# Patient Record
Sex: Female | Born: 1945
Health system: Southern US, Community
[De-identification: ages and names within clinical notes are randomized; demographics above are authoritative.]

## PROBLEM LIST (undated history)

## (undated) DIAGNOSIS — M5481 Occipital neuralgia: Secondary | ICD-10-CM

## (undated) DIAGNOSIS — I3139 Other pericardial effusion (noninflammatory): Secondary | ICD-10-CM

## (undated) DIAGNOSIS — I428 Other cardiomyopathies: Secondary | ICD-10-CM

## (undated) DIAGNOSIS — I2510 Atherosclerotic heart disease of native coronary artery without angina pectoris: Secondary | ICD-10-CM

## (undated) DIAGNOSIS — M5136 Other intervertebral disc degeneration, lumbar region: Secondary | ICD-10-CM

## (undated) DIAGNOSIS — R7303 Prediabetes: Secondary | ICD-10-CM

## (undated) DIAGNOSIS — M51369 Other intervertebral disc degeneration, lumbar region without mention of lumbar back pain or lower extremity pain: Secondary | ICD-10-CM

## (undated) DIAGNOSIS — E559 Vitamin D deficiency, unspecified: Secondary | ICD-10-CM

## (undated) DIAGNOSIS — E039 Hypothyroidism, unspecified: Secondary | ICD-10-CM

## (undated) DIAGNOSIS — K219 Gastro-esophageal reflux disease without esophagitis: Secondary | ICD-10-CM

## (undated) DIAGNOSIS — I10 Essential (primary) hypertension: Secondary | ICD-10-CM

## (undated) DIAGNOSIS — I700 Atherosclerosis of aorta: Secondary | ICD-10-CM

## (undated) DIAGNOSIS — R51 Headache: Secondary | ICD-10-CM

## (undated) DIAGNOSIS — T7840XA Allergy, unspecified, initial encounter: Secondary | ICD-10-CM

## (undated) DIAGNOSIS — K5790 Diverticulosis of intestine, part unspecified, without perforation or abscess without bleeding: Secondary | ICD-10-CM

## (undated) DIAGNOSIS — K449 Diaphragmatic hernia without obstruction or gangrene: Secondary | ICD-10-CM

## (undated) DIAGNOSIS — Z8619 Personal history of other infectious and parasitic diseases: Secondary | ICD-10-CM

## (undated) DIAGNOSIS — F329 Major depressive disorder, single episode, unspecified: Secondary | ICD-10-CM

## (undated) DIAGNOSIS — M6281 Muscle weakness (generalized): Secondary | ICD-10-CM

## (undated) DIAGNOSIS — F419 Anxiety disorder, unspecified: Secondary | ICD-10-CM

## (undated) DIAGNOSIS — T8859XA Other complications of anesthesia, initial encounter: Secondary | ICD-10-CM

## (undated) DIAGNOSIS — E785 Hyperlipidemia, unspecified: Secondary | ICD-10-CM

## (undated) DIAGNOSIS — M1990 Unspecified osteoarthritis, unspecified site: Secondary | ICD-10-CM

## (undated) DIAGNOSIS — J90 Pleural effusion, not elsewhere classified: Secondary | ICD-10-CM

## (undated) DIAGNOSIS — E539 Vitamin B deficiency, unspecified: Secondary | ICD-10-CM

## (undated) DIAGNOSIS — M549 Dorsalgia, unspecified: Secondary | ICD-10-CM

## (undated) DIAGNOSIS — G629 Polyneuropathy, unspecified: Secondary | ICD-10-CM

## (undated) DIAGNOSIS — D61818 Other pancytopenia: Secondary | ICD-10-CM

## (undated) DIAGNOSIS — I509 Heart failure, unspecified: Secondary | ICD-10-CM

## (undated) DIAGNOSIS — N390 Urinary tract infection, site not specified: Secondary | ICD-10-CM

## (undated) DIAGNOSIS — F32A Depression, unspecified: Secondary | ICD-10-CM

## (undated) DIAGNOSIS — N189 Chronic kidney disease, unspecified: Secondary | ICD-10-CM

## (undated) DIAGNOSIS — G479 Sleep disorder, unspecified: Secondary | ICD-10-CM

## (undated) DIAGNOSIS — N182 Chronic kidney disease, stage 2 (mild): Secondary | ICD-10-CM

## (undated) DIAGNOSIS — R002 Palpitations: Secondary | ICD-10-CM

## (undated) DIAGNOSIS — I639 Cerebral infarction, unspecified: Secondary | ICD-10-CM

## (undated) DIAGNOSIS — I429 Cardiomyopathy, unspecified: Secondary | ICD-10-CM

## (undated) DIAGNOSIS — N281 Cyst of kidney, acquired: Secondary | ICD-10-CM

## (undated) DIAGNOSIS — I5022 Chronic systolic (congestive) heart failure: Secondary | ICD-10-CM

## (undated) DIAGNOSIS — J9600 Acute respiratory failure, unspecified whether with hypoxia or hypercapnia: Secondary | ICD-10-CM

## (undated) DIAGNOSIS — R519 Headache, unspecified: Secondary | ICD-10-CM

## (undated) DIAGNOSIS — Z993 Dependence on wheelchair: Secondary | ICD-10-CM

## (undated) DIAGNOSIS — R911 Solitary pulmonary nodule: Secondary | ICD-10-CM

## (undated) DIAGNOSIS — Z789 Other specified health status: Secondary | ICD-10-CM

## (undated) HISTORY — DX: Major depressive disorder, single episode, unspecified: F32.9

## (undated) HISTORY — PX: ABDOMINAL HYSTERECTOMY: SHX81

## (undated) HISTORY — PX: OTHER SURGICAL HISTORY: SHX169

## (undated) HISTORY — DX: Chronic kidney disease, stage 2 (mild): N18.2

## (undated) HISTORY — DX: Diverticulosis of intestine, part unspecified, without perforation or abscess without bleeding: K57.90

## (undated) HISTORY — DX: Allergy, unspecified, initial encounter: T78.40XA

## (undated) HISTORY — PX: BLADDER SURGERY: SHX569

## (undated) HISTORY — DX: Pleural effusion, not elsewhere classified: J90

## (undated) HISTORY — DX: Hyperlipidemia, unspecified: E78.5

## (undated) HISTORY — PX: JOINT REPLACEMENT: SHX530

## (undated) HISTORY — PX: TONSILLECTOMY AND ADENOIDECTOMY: SUR1326

## (undated) HISTORY — PX: REPLACEMENT TOTAL KNEE: SUR1224

## (undated) HISTORY — DX: Depression, unspecified: F32.A

## (undated) HISTORY — DX: Prediabetes: R73.03

## (undated) HISTORY — DX: Other pericardial effusion (noninflammatory): I31.39

## (undated) HISTORY — DX: Occipital neuralgia: M54.81

## (undated) HISTORY — DX: Chronic systolic (congestive) heart failure: I50.22

## (undated) HISTORY — DX: Gastro-esophageal reflux disease without esophagitis: K21.9

## (undated) HISTORY — PX: BREAST BIOPSY: SHX20

## (undated) HISTORY — DX: Heart failure, unspecified: I50.9

## (undated) HISTORY — DX: Other cardiomyopathies: I42.8

## (undated) HISTORY — DX: Cerebral infarction, unspecified: I63.9

## (undated) HISTORY — PX: CHOLECYSTECTOMY: SHX55

## (undated) HISTORY — DX: Cardiomyopathy, unspecified: I42.9

## (undated) HISTORY — DX: Atherosclerotic heart disease of native coronary artery without angina pectoris: I25.10

## (undated) HISTORY — PX: BREAST EXCISIONAL BIOPSY: SUR124

## (undated) HISTORY — DX: Personal history of other infectious and parasitic diseases: Z86.19

## (undated) HISTORY — DX: Vitamin D deficiency, unspecified: E55.9

## (undated) HISTORY — DX: Unspecified osteoarthritis, unspecified site: M19.90

---

## 2005-11-21 ENCOUNTER — Other Ambulatory Visit: Admission: RE | Admit: 2005-11-21 | Discharge: 2005-11-21 | Payer: Self-pay | Admitting: Internal Medicine

## 2006-01-06 ENCOUNTER — Emergency Department (HOSPITAL_COMMUNITY): Admission: EM | Admit: 2006-01-06 | Discharge: 2006-01-07 | Payer: Self-pay | Admitting: Emergency Medicine

## 2006-05-28 ENCOUNTER — Ambulatory Visit: Payer: Self-pay | Admitting: *Deleted

## 2006-05-29 ENCOUNTER — Ambulatory Visit: Payer: Self-pay | Admitting: Cardiology

## 2006-06-10 ENCOUNTER — Ambulatory Visit: Payer: Self-pay | Admitting: *Deleted

## 2006-06-16 ENCOUNTER — Encounter: Payer: Self-pay | Admitting: Cardiology

## 2006-06-16 ENCOUNTER — Ambulatory Visit: Payer: Self-pay

## 2006-12-17 LAB — HM DEXA SCAN

## 2007-11-24 ENCOUNTER — Ambulatory Visit (HOSPITAL_COMMUNITY): Admission: RE | Admit: 2007-11-24 | Discharge: 2007-11-24 | Payer: Self-pay | Admitting: Sports Medicine

## 2007-12-02 ENCOUNTER — Encounter: Admission: RE | Admit: 2007-12-02 | Discharge: 2007-12-02 | Payer: Self-pay | Admitting: Sports Medicine

## 2007-12-18 ENCOUNTER — Encounter: Admission: RE | Admit: 2007-12-18 | Discharge: 2007-12-18 | Payer: Self-pay | Admitting: Sports Medicine

## 2008-04-25 ENCOUNTER — Inpatient Hospital Stay (HOSPITAL_COMMUNITY): Admission: EM | Admit: 2008-04-25 | Discharge: 2008-04-28 | Payer: Self-pay | Admitting: Emergency Medicine

## 2008-04-25 ENCOUNTER — Ambulatory Visit: Payer: Self-pay | Admitting: Cardiology

## 2008-04-26 ENCOUNTER — Encounter (INDEPENDENT_AMBULATORY_CARE_PROVIDER_SITE_OTHER): Payer: Self-pay | Admitting: Internal Medicine

## 2008-04-26 ENCOUNTER — Encounter: Payer: Self-pay | Admitting: Internal Medicine

## 2008-04-26 ENCOUNTER — Ambulatory Visit: Payer: Self-pay | Admitting: Vascular Surgery

## 2008-04-27 ENCOUNTER — Encounter (INDEPENDENT_AMBULATORY_CARE_PROVIDER_SITE_OTHER): Payer: Self-pay | Admitting: Internal Medicine

## 2009-03-30 ENCOUNTER — Emergency Department (HOSPITAL_COMMUNITY): Admission: EM | Admit: 2009-03-30 | Discharge: 2009-03-30 | Payer: Self-pay | Admitting: Emergency Medicine

## 2009-06-26 ENCOUNTER — Emergency Department (HOSPITAL_COMMUNITY): Admission: EM | Admit: 2009-06-26 | Discharge: 2009-06-26 | Payer: Self-pay | Admitting: Emergency Medicine

## 2009-08-05 DIAGNOSIS — I639 Cerebral infarction, unspecified: Secondary | ICD-10-CM

## 2009-08-05 DIAGNOSIS — G459 Transient cerebral ischemic attack, unspecified: Secondary | ICD-10-CM

## 2009-08-05 HISTORY — DX: Cerebral infarction, unspecified: I63.9

## 2009-08-05 HISTORY — PX: KNEE ARTHROSCOPY: SHX127

## 2009-08-05 HISTORY — DX: Transient cerebral ischemic attack, unspecified: G45.9

## 2009-11-29 ENCOUNTER — Emergency Department (HOSPITAL_COMMUNITY): Admission: EM | Admit: 2009-11-29 | Discharge: 2009-11-29 | Payer: Self-pay | Admitting: Emergency Medicine

## 2009-12-12 ENCOUNTER — Telehealth (INDEPENDENT_AMBULATORY_CARE_PROVIDER_SITE_OTHER): Payer: Self-pay | Admitting: *Deleted

## 2009-12-26 ENCOUNTER — Inpatient Hospital Stay (HOSPITAL_COMMUNITY): Admission: EM | Admit: 2009-12-26 | Discharge: 2009-12-28 | Payer: Self-pay | Admitting: Emergency Medicine

## 2010-01-02 ENCOUNTER — Encounter: Admission: RE | Admit: 2010-01-02 | Discharge: 2010-01-15 | Payer: Self-pay | Admitting: Orthopedic Surgery

## 2010-01-16 LAB — HM PAP SMEAR: HM Pap smear: NEGATIVE

## 2010-01-26 ENCOUNTER — Other Ambulatory Visit: Admission: RE | Admit: 2010-01-26 | Discharge: 2010-01-26 | Payer: Self-pay | Admitting: Internal Medicine

## 2010-02-19 ENCOUNTER — Encounter: Admission: RE | Admit: 2010-02-19 | Discharge: 2010-02-19 | Payer: Self-pay | Admitting: Internal Medicine

## 2010-08-26 ENCOUNTER — Encounter: Payer: Self-pay | Admitting: Sports Medicine

## 2010-09-04 NOTE — Progress Notes (Signed)
  Faxed Stress over to [facility] [phone] [facility]  Dec 12, 2009 10:41 AM

## 2010-10-22 LAB — GLUCOSE, CAPILLARY
Glucose-Capillary: 102 mg/dL — ABNORMAL HIGH (ref 70–99)
Glucose-Capillary: 110 mg/dL — ABNORMAL HIGH (ref 70–99)
Glucose-Capillary: 117 mg/dL — ABNORMAL HIGH (ref 70–99)
Glucose-Capillary: 119 mg/dL — ABNORMAL HIGH (ref 70–99)
Glucose-Capillary: 125 mg/dL — ABNORMAL HIGH (ref 70–99)
Glucose-Capillary: 149 mg/dL — ABNORMAL HIGH (ref 70–99)
Glucose-Capillary: 85 mg/dL (ref 70–99)
Glucose-Capillary: 88 mg/dL (ref 70–99)
Glucose-Capillary: 92 mg/dL (ref 70–99)
Glucose-Capillary: 92 mg/dL (ref 70–99)

## 2010-10-22 LAB — URINE MICROSCOPIC-ADD ON

## 2010-10-22 LAB — CBC
HCT: 30.9 % — ABNORMAL LOW (ref 36.0–46.0)
HCT: 32 % — ABNORMAL LOW (ref 36.0–46.0)
HCT: 38.5 % (ref 36.0–46.0)
Hemoglobin: 10.3 g/dL — ABNORMAL LOW (ref 12.0–15.0)
Hemoglobin: 10.8 g/dL — ABNORMAL LOW (ref 12.0–15.0)
Hemoglobin: 13.4 g/dL (ref 12.0–15.0)
MCHC: 33.3 g/dL (ref 30.0–36.0)
MCHC: 33.7 g/dL (ref 30.0–36.0)
MCHC: 34.7 g/dL (ref 30.0–36.0)
MCV: 91.8 fL (ref 78.0–100.0)
MCV: 93 fL (ref 78.0–100.0)
MCV: 93.6 fL (ref 78.0–100.0)
Platelets: 154 10*3/uL (ref 150–400)
Platelets: 174 10*3/uL (ref 150–400)
Platelets: 192 10*3/uL (ref 150–400)
RBC: 3.3 MIL/uL — ABNORMAL LOW (ref 3.87–5.11)
RBC: 3.44 MIL/uL — ABNORMAL LOW (ref 3.87–5.11)
RBC: 4.2 MIL/uL (ref 3.87–5.11)
RDW: 11.5 % (ref 11.5–15.5)
RDW: 12.3 % (ref 11.5–15.5)
RDW: 12.7 % (ref 11.5–15.5)
WBC: 4.2 10*3/uL (ref 4.0–10.5)
WBC: 6.1 10*3/uL (ref 4.0–10.5)
WBC: 9.2 10*3/uL (ref 4.0–10.5)

## 2010-10-22 LAB — CULTURE, BLOOD (ROUTINE X 2)
Culture: NO GROWTH
Culture: NO GROWTH

## 2010-10-22 LAB — COMPREHENSIVE METABOLIC PANEL
ALT: 20 U/L (ref 0–35)
ALT: 23 U/L (ref 0–35)
ALT: 28 U/L (ref 0–35)
AST: 24 U/L (ref 0–37)
AST: 26 U/L (ref 0–37)
AST: 39 U/L — ABNORMAL HIGH (ref 0–37)
Albumin: 2.9 g/dL — ABNORMAL LOW (ref 3.5–5.2)
Albumin: 3 g/dL — ABNORMAL LOW (ref 3.5–5.2)
Albumin: 4 g/dL (ref 3.5–5.2)
Alkaline Phosphatase: 61 U/L (ref 39–117)
Alkaline Phosphatase: 61 U/L (ref 39–117)
Alkaline Phosphatase: 77 U/L (ref 39–117)
BUN: 11 mg/dL (ref 6–23)
BUN: 21 mg/dL (ref 6–23)
BUN: 5 mg/dL — ABNORMAL LOW (ref 6–23)
CO2: 21 mEq/L (ref 19–32)
CO2: 26 mEq/L (ref 19–32)
CO2: 27 mEq/L (ref 19–32)
Calcium: 8.4 mg/dL (ref 8.4–10.5)
Calcium: 8.6 mg/dL (ref 8.4–10.5)
Calcium: 9.3 mg/dL (ref 8.4–10.5)
Chloride: 106 mEq/L (ref 96–112)
Chloride: 110 mEq/L (ref 96–112)
Chloride: 112 mEq/L (ref 96–112)
Creatinine, Ser: 1 mg/dL (ref 0.4–1.2)
Creatinine, Ser: 1.05 mg/dL (ref 0.4–1.2)
Creatinine, Ser: 1.05 mg/dL (ref 0.4–1.2)
GFR calc Af Amer: >60 mL/min (ref >60)
GFR calc Af Amer: >60 mL/min (ref >60)
GFR calc Af Amer: >60 mL/min (ref >60)
GFR calc non Af Amer: 53 mL/min — ABNORMAL LOW (ref >60)
GFR calc non Af Amer: 53 mL/min — ABNORMAL LOW (ref >60)
GFR calc non Af Amer: 56 mL/min — ABNORMAL LOW (ref >60)
Glucose, Bld: 100 mg/dL — ABNORMAL HIGH (ref 70–99)
Glucose, Bld: 144 mg/dL — ABNORMAL HIGH (ref 70–99)
Glucose, Bld: 95 mg/dL (ref 70–99)
Potassium: 3.9 mEq/L (ref 3.5–5.1)
Potassium: 4.3 mEq/L (ref 3.5–5.1)
Potassium: 4.4 mEq/L (ref 3.5–5.1)
Sodium: 137 mEq/L (ref 135–145)
Sodium: 141 mEq/L (ref 135–145)
Sodium: 141 mEq/L (ref 135–145)
Total Bilirubin: 0.3 mg/dL (ref 0.3–1.2)
Total Bilirubin: 0.3 mg/dL (ref 0.3–1.2)
Total Bilirubin: 0.8 mg/dL (ref 0.3–1.2)
Total Protein: 5.2 g/dL — ABNORMAL LOW (ref 6.0–8.3)
Total Protein: 5.4 g/dL — ABNORMAL LOW (ref 6.0–8.3)
Total Protein: 7.1 g/dL (ref 6.0–8.3)

## 2010-10-22 LAB — STOOL CULTURE

## 2010-10-22 LAB — DIFFERENTIAL
Basophils Absolute: 0.1 10*3/uL (ref 0.0–0.1)
Basophils Relative: 1 % (ref 0–1)
Eosinophils Absolute: 0.1 10*3/uL (ref 0.0–0.7)
Eosinophils Relative: 1 % (ref 0–5)
Lymphocytes Relative: 11 % — ABNORMAL LOW (ref 12–46)
Lymphs Abs: 1.1 10*3/uL (ref 0.7–4.0)
Monocytes Absolute: 0.4 10*3/uL (ref 0.1–1.0)
Monocytes Relative: 4 % (ref 3–12)
Neutro Abs: 7.5 10*3/uL (ref 1.7–7.7)
Neutrophils Relative %: 83 % — ABNORMAL HIGH (ref 43–77)

## 2010-10-22 LAB — LIPID PANEL
Cholesterol: 90 mg/dL (ref 0–200)
HDL: 33 mg/dL — ABNORMAL LOW (ref >39)
LDL Cholesterol: 40 mg/dL (ref 0–99)
Total CHOL/HDL Ratio: 2.7 RATIO
Triglycerides: 87 mg/dL (ref <150)
VLDL: 17 mg/dL (ref 0–40)

## 2010-10-22 LAB — URINALYSIS, ROUTINE W REFLEX MICROSCOPIC
Glucose, UA: NEGATIVE mg/dL
Hgb urine dipstick: NEGATIVE
Nitrite: NEGATIVE
Protein, ur: NEGATIVE mg/dL
Specific Gravity, Urine: 1.021 (ref 1.005–1.030)
Urobilinogen, UA: 0.2 mg/dL (ref 0.0–1.0)
pH: 5.5 (ref 5.0–8.0)

## 2010-10-22 LAB — HEMOGLOBIN A1C
Hgb A1c MFr Bld: 5.3 % (ref <5.7)
Mean Plasma Glucose: 105 mg/dL (ref <117)

## 2010-10-22 LAB — PROTIME-INR
INR: 1.01 (ref 0.00–1.49)
Prothrombin Time: 13.2 seconds (ref 11.6–15.2)

## 2010-10-22 LAB — URINE CULTURE: Colony Count: 60000

## 2010-10-22 LAB — LACTIC ACID, PLASMA: Lactic Acid, Venous: 1.6 mmol/L (ref 0.5–2.2)

## 2010-10-22 LAB — LIPASE, BLOOD
Lipase: 62 U/L — ABNORMAL HIGH (ref 11–59)
Lipase: 79 U/L — ABNORMAL HIGH (ref 11–59)

## 2010-10-22 LAB — CLOSTRIDIUM DIFFICILE EIA
C difficile Toxins A+B, EIA: 25
C difficile Toxins A+B, EIA: NEGATIVE

## 2010-10-22 LAB — TSH: TSH: 2.122 u[IU]/mL (ref 0.350–4.500)

## 2010-10-22 LAB — HEMOCCULT GUIAC POC 1CARD (OFFICE): Fecal Occult Bld: NEGATIVE

## 2010-10-22 LAB — APTT: aPTT: 28 seconds (ref 24–37)

## 2010-11-07 LAB — COMPREHENSIVE METABOLIC PANEL WITH GFR
ALT: 29 U/L (ref 0–35)
AST: 26 U/L (ref 0–37)
Albumin: 3.9 g/dL (ref 3.5–5.2)
Alkaline Phosphatase: 75 U/L (ref 39–117)
BUN: 15 mg/dL (ref 6–23)
CO2: 21 meq/L (ref 19–32)
Calcium: 9.4 mg/dL (ref 8.4–10.5)
Chloride: 101 meq/L (ref 96–112)
Creatinine, Ser: 1.01 mg/dL (ref 0.4–1.2)
GFR calc non Af Amer: 55 mL/min — ABNORMAL LOW
Glucose, Bld: 103 mg/dL — ABNORMAL HIGH (ref 70–99)
Potassium: 3.9 meq/L (ref 3.5–5.1)
Sodium: 133 meq/L — ABNORMAL LOW (ref 135–145)
Total Bilirubin: 0.9 mg/dL (ref 0.3–1.2)
Total Protein: 7.5 g/dL (ref 6.0–8.3)

## 2010-11-07 LAB — DIFFERENTIAL
Basophils Absolute: 0 10*3/uL (ref 0.0–0.1)
Basophils Relative: 0 % (ref 0–1)
Eosinophils Absolute: 0.1 10*3/uL (ref 0.0–0.7)
Eosinophils Relative: 1 % (ref 0–5)
Lymphocytes Relative: 22 % (ref 12–46)
Lymphs Abs: 1.1 10*3/uL (ref 0.7–4.0)
Monocytes Absolute: 0.4 10*3/uL (ref 0.1–1.0)
Monocytes Relative: 9 % (ref 3–12)
Neutro Abs: 3.3 10*3/uL (ref 1.7–7.7)
Neutrophils Relative %: 68 % (ref 43–77)

## 2010-11-07 LAB — URINE CULTURE: Colony Count: 50000

## 2010-11-07 LAB — URINALYSIS, ROUTINE W REFLEX MICROSCOPIC
Glucose, UA: NEGATIVE mg/dL
Hgb urine dipstick: NEGATIVE
Ketones, ur: 15 mg/dL — AB
Nitrite: NEGATIVE
Protein, ur: NEGATIVE mg/dL
Specific Gravity, Urine: 1.023 (ref 1.005–1.030)
Urobilinogen, UA: 1 mg/dL (ref 0.0–1.0)
pH: 5.5 (ref 5.0–8.0)

## 2010-11-07 LAB — URINE MICROSCOPIC-ADD ON

## 2010-11-07 LAB — CBC
HCT: 41.3 % (ref 36.0–46.0)
Hemoglobin: 14.4 g/dL (ref 12.0–15.0)
MCHC: 34.9 g/dL (ref 30.0–36.0)
MCV: 93.1 fL (ref 78.0–100.0)
Platelets: 155 10*3/uL (ref 150–400)
RBC: 4.44 MIL/uL (ref 3.87–5.11)
RDW: 12.7 % (ref 11.5–15.5)
WBC: 4.9 10*3/uL (ref 4.0–10.5)

## 2010-11-07 LAB — POCT CARDIAC MARKERS
CKMB, poc: <1 ng/mL — ABNORMAL LOW (ref 1.0–8.0)
Myoglobin, poc: 74.8 ng/mL (ref 12–200)
Troponin i, poc: <0.05 ng/mL (ref 0.00–0.09)

## 2010-11-10 LAB — CBC
HCT: 42.6 % (ref 36.0–46.0)
Hemoglobin: 14.7 g/dL (ref 12.0–15.0)
MCHC: 34.6 g/dL (ref 30.0–36.0)
MCV: 94.4 fL (ref 78.0–100.0)
Platelets: 184 10*3/uL (ref 150–400)
RBC: 4.51 MIL/uL (ref 3.87–5.11)
RDW: 11.9 % (ref 11.5–15.5)
WBC: 7.3 10*3/uL (ref 4.0–10.5)

## 2010-11-10 LAB — COMPREHENSIVE METABOLIC PANEL
ALT: 25 U/L (ref 0–35)
AST: 24 U/L (ref 0–37)
Albumin: 4.6 g/dL (ref 3.5–5.2)
Alkaline Phosphatase: 82 U/L (ref 39–117)
BUN: 28 mg/dL — ABNORMAL HIGH (ref 6–23)
CO2: 24 mEq/L (ref 19–32)
Calcium: 9.6 mg/dL (ref 8.4–10.5)
Chloride: 101 mEq/L (ref 96–112)
Creatinine, Ser: 1.16 mg/dL (ref 0.4–1.2)
GFR calc Af Amer: 57 mL/min — ABNORMAL LOW (ref >60)
GFR calc non Af Amer: 47 mL/min — ABNORMAL LOW (ref >60)
Glucose, Bld: 92 mg/dL (ref 70–99)
Potassium: 3.3 mEq/L — ABNORMAL LOW (ref 3.5–5.1)
Sodium: 136 mEq/L (ref 135–145)
Total Bilirubin: 0.7 mg/dL (ref 0.3–1.2)
Total Protein: 7.9 g/dL (ref 6.0–8.3)

## 2010-11-10 LAB — URINALYSIS, ROUTINE W REFLEX MICROSCOPIC
Bilirubin Urine: NEGATIVE
Glucose, UA: NEGATIVE mg/dL
Hgb urine dipstick: NEGATIVE
Ketones, ur: NEGATIVE mg/dL
Nitrite: NEGATIVE
Protein, ur: NEGATIVE mg/dL
Specific Gravity, Urine: 1.012 (ref 1.005–1.030)
Urobilinogen, UA: 0.2 mg/dL (ref 0.0–1.0)
pH: 5 (ref 5.0–8.0)

## 2010-11-10 LAB — DIFFERENTIAL
Basophils Absolute: 0 10*3/uL (ref 0.0–0.1)
Basophils Relative: 1 % (ref 0–1)
Eosinophils Absolute: 0.1 10*3/uL (ref 0.0–0.7)
Eosinophils Relative: 2 % (ref 0–5)
Lymphocytes Relative: 23 % (ref 12–46)
Lymphs Abs: 1.7 10*3/uL (ref 0.7–4.0)
Monocytes Absolute: 0.5 10*3/uL (ref 0.1–1.0)
Monocytes Relative: 6 % (ref 3–12)
Neutro Abs: 5 10*3/uL (ref 1.7–7.7)
Neutrophils Relative %: 69 % (ref 43–77)

## 2010-11-10 LAB — URINE CULTURE: Colony Count: 15000

## 2010-11-10 LAB — URINE MICROSCOPIC-ADD ON

## 2010-11-10 LAB — POCT CARDIAC MARKERS
CKMB, poc: 1.6 ng/mL (ref 1.0–8.0)
Myoglobin, poc: 112 ng/mL (ref 12–200)
Troponin i, poc: <0.05 ng/mL (ref 0.00–0.09)

## 2010-12-18 NOTE — H&P (Signed)
NAME:  Kimberly Cook, Kimberly Cook NO.:  [id number]   MEDICAL RECORD NO.:  [id number]          PATIENT TYPE:  EMS   LOCATION:  ED                           FACILITY:  [facility]   PHYSICIAN:  Richarda Overlie, MD       DATE OF BIRTH:  [birth year]   DATE OF ADMISSION:  04/25/2008  DATE OF DISCHARGE:                              HISTORY & PHYSICAL   OBJECTIVE:  This is a 65 year old female with a history of dyslipidemia  and hypertension who presents to the Emergency Room with the chief  complaint of light headedness for the last three weeks. The patient  states that three weeks ago she was treated for a urinary tract  infection and the primary care doctor prescribed Ciprofloxacin. The  patient started Ciprofloxacin last Thursday and took it through Sunday.  The patient thought that the light headedness was a side effect of the  Ciprofloxacin and discontinued the Ciprofloxacin herself. The patient  has experienced light headedness especially on changing positions from a  sitting to a standing position. She has also experienced occipital  headache and occasional blurry and double vision when she wakes up in  the morning.  She denies any chest pain, palpitation, shortness of  breath. Denies any fevers, chills or rigors. At home she has experienced  flank pain three weeks ago which somewhat improved after she started on  Ciprofloxacin.  In the Emergency Room the patient was found to be orthostatic,  tachycardic but found to be afebrile.   REVIEW OF SYSTEMS:  A temperature on review of systems was done and is  included H/P.   PAST MEDICAL HISTORY:  Anxiety, hypertension, hypertriglyceridemia,  hypothyroidism.   SOCIAL HISTORY:  Denies any drug use, smoking history or drinking  history.   FAMILY HISTORY:  Positive for cancer, diabetes and hypertension.   HOME MEDICATIONS:  1. Triamterine  2. Hydrochlorothiazide  3. Zocor 40 mg q daily  4. Levothyroxine 30 mcg q daily  5. Vitamin D  one tablet twice a day  6. Omeprazole 20 mg once a day  7. Aspirin 81 mg p.o. daily.   PHYSICAL EXAMINATION:  VITAL SIGNS:  Blood pressure sitting 112/82,  pulse of 80, respirations 22.  Lying 108/82, pulse 81.  Standing 110/70,  pulse 101, respirations 22.  GENERAL:  The patient appears to be anxious, otherwise comfortable in no  acute distress.  HEENT:  Pupils equal and reactive to light. Extraocular movements are  intact.  The sclera are anicteric.  LUNGS:  Clear to auscultation bilaterally, no wheezes, no crackles or  rhonchi. No accessory muscle use.  CARDIOVASCULAR:  Regular rate and rhythm.  No appreciable murmurs, rubs  or gallops.  No JVD.  ABDOMEN:  Soft, non-tender, non-distended.  No hepatosplenomegaly.  No  CVA tenderness elicited. Hypoactive bowel sounds.  EXTREMITIES:  Trace pedal edema noted in bilateral lower extremities.  Distal pulses 2+ bilaterally.  NEUROLOGIC:  Cranial nerves 2 to 12 grossly intact without any focal  neurologic deficits noted. However the patient has rotatory nystagmus.  No cerebellar deficits noted.  SKIN:  Intact without any bruises  or petechiae.   LABS:  WBC .7, hemoglobin 14, blood hematocrit 41.2, MCV 90.6, platelet  count 356, sodium 131, potassium 2.8, chloride 88, glucose 151, BUN 51,  creatinine 2.2, calcium 9.6, albumin 3.8, specific gravity 1.016.   Troponin of 0.03.  EKG shows normal sinus rhythm without any obvious ST  changes.   ASSESSMENT & PLAN:  1. Presyncope, light headedness. The patient's symptoms of presyncope      and light headed could be related to her most recent episode of      urinary tract infection and dehydration which is reflected in her      renal insufficiency. The patient is also found to be orthostatic      therefore rehydration will be initiated.  ACS  will be ruled out      and the patient will be monitored on a telemetry bed. The patient      does not report any symptoms of chest pain or palpitations.       Therefore I doubt that this is related to valvular heart disease,      however she is a longstanding hypertensive and other CVS RF ,      cardiac echo will be obtained.  2. Vertigo, light headedness. The patient has the presence of a      nystagmus.  A brain stem CVA cannot be ruled out at this time      without any imaging studies. Therefore an MRI of the brain will be      obtained. The patient will be started neuro checks q4 hours.  The      patient will also be started on empiric Meclizine 25 mg p.o. every      8 hours and aspirin 81 mg p.o. daily.  3. Dyslipidemia. The patient will be continued on Zocor. We will check      her lipid status in the morning.  4. Hypothyroidism.  The patient will be continued on her Synthroid and      a TSH and free T4 will be checked in the morning.  5. Renal insufficiency. This is likely prerenal etiology.  We will      check her CPK, hydrate aggressively with IV fluids and repeat the      BMP  in the morning.   The patient will be admitted to a medical bed with telemetry.      Richarda Overlie, MD  Electronically Signed     NA/MEDQ  D:  04/25/2008  T:  04/25/2008  Job:  [id number]

## 2010-12-18 NOTE — Discharge Summary (Signed)
NAME:  Kimberly Cook, Kimberly Cook             ACCOUNT NO.:  [id number]   MEDICAL RECORD NO.:  [id number]          PATIENT TYPE:  INP   LOCATION:  1424                         FACILITY:  [facility]   PHYSICIAN:  Eduard Clos, MDDATE OF BIRTH:  13-Oct-1945   DATE OF ADMISSION:  04/25/2008  DATE OF DISCHARGE:  04/28/2008                               DISCHARGE SUMMARY   FINAL DIAGNOSES:  1. Near syncope secondary to dehydration.  2. Acute renal failure improved with hydration.  3. Possible cerebrovascular accident.  4. Hypothyroidism.  5. Hyperlipidemia.  6. Chronic headache.  7. Hypertension.   COURSE IN HOSPITAL:  A 65 year old female with a history of dyslipidemia  and hypertension presented to the ER with complaints of lightheadedness  over the last 2 or 3 weeks.  On admission, the patient was found to have  orthostatic changes.  The patient was on diuretics called Maxzide, which  was discontinued, and her creatinine was found to be around 2.2.  The  patient was placed on gentle hydration.  The patient also had an MRI and  MRA of the brain.  MRI showed possible punctate  infarct in the left  superior frontal gyrus with, as a matter of fact, no associated  abnormality, hemorrhage, or mass effect.  Otherwise, no acute  intracranial abnormality.  MRA was negative for any intracranial  process.  The patient was hydrated with IV fluids, and her creatinine  slowly improved and at the time of discharge is 1.2.  The patient's  symptoms are largely resolved at this time.  The patient has been having  chronic headaches over the last more than 10 years, which the patient  states are usually relieved by Percogesic medication.  At this time, a  two-D echo has been done; the results are still pending.  As the patient  is eager to go home, I have advised patient to follow the results of two-  D echo with her primary care physician, Dr. Marisue Brooklyn, for which she  has agreed.   DISCHARGE  MEDICATIONS:  1. Zocor 40 mg p.o. daily.  2. Levothyroxine has been increased to 75 mcg p.o. daily.  3. Vitamin D 200 mg twice a day.  4. Prilosec 20 mg p.o. daily.  5. Aleve 200 mg p.o. twice a day as needed p.r.n.  6. Aspirin 325 mg p.o. daily.  7. Fioricet 1 tablet p.o. q.6 p.r.n. for headache.   PLAN:  The patient advised to follow with her primary care physician  within a week's time to recheck a basic metabolic panel and to follow  pending two-D echo results with primary care physician, Dr. Marisue Brooklyn.  Recheck TSH in 1 month's time.  The patient is to be on a  cardiac healthy diet and to have her activity as tolerated with fall  precautions.      Eduard Clos, MD  Electronically Signed     ANK/MEDQ  D:  04/28/2008  T:  04/28/2008  Job:  [id number]   cc:   Lovenia Kim, D.O.  Fax: [phone]

## 2010-12-21 NOTE — Letter (Signed)
May 28, 2006     Lovenia Kim, D.O.  [address]  [place], [place] [address]   RE:  Kimberly Cook, Kimberly Cook  MRN:  [id number]  /  DOB:  1946-01-13   Dear Amy:   I had the opportunity of performing a cardiology consultation on your nice  patient, Kimberly Cook.   As you know, Kimberly Cook is a very pleasant 65 year old white, married  female with multiple risk factors and recent history of dizzy spells  associated with palpitations.  She has noticed somewhat similar symptoms  over the past year, but these have been more prevalent in the past several  weeks.  Last Thursday p.m., she was quite tremulous, shaking all over, she  thinks that her heart was beating fast.  She has also had some precordial  chest pressure described as a clinched fist, however, this is somewhat  atypical in that it is not necessarily related to exertion.   Risk factors include family history with father dying of a heart attack at  age 21, mother dying of a heart attack and diabetes at age 33.  She has  hyperlipidemia controlled on therapy and hypertension controlled on therapy.  Allergies are none.  Cigarettes are none.  No drug use.  Coffee none.   Past hospitalization includes hysterectomy in 1974, gastric plication in  1985, bladder repair in 2002.  After the stomach plication, the patient lost  100 pounds and now has regained about 40 pounds.   REVIEW OF SYSTEMS:  As in the HPI - she does have dizziness usually  associated with palpitations.  Also, she has headaches.  Eyes:  She wears  contact lenses.  She has had tonsillectomy.  Cardiovascular:  As in the  present illness.  GI history is negative except for occasional reflux.  GU  negative.  GYN:  Three pregnancies with two children.  Endocrine:  Negative.  Musculoskeletal:  Leg cramps and generalized arthritis.   SOCIAL HISTORY:  She works as a Probation officer.  She has two children.   FAMILY HISTORY:  As previously   noted.   Medications include:  1. Zocor 40.  2. Maxzide 75/50.  3. Ambien 10.  4. Prilosec 20.  5. Aleve.  6. Lexapro 10.  7. Accupril 20.  8. Aspirin 81, which she started a few weeks ago with the encouragement of      her sister.   PHYSICAL EXAMINATION:  Blood pressure 141/91, pulse 81.  Normal sinus  rhythm.  GENERAL:  Moderately obese.  HEENT:  Unremarkable.  NECK:  JVP not elevated.  Thyroid normal.  Carotid pulses palpable without  bruits.  LUNGS:  Clear.  CARDIAC:  Exam reveals no murmur, gallop, and rub.  ABDOMEN:  Liver, spleen and kidneys not palpable.  No masses.  EXTREMITIES:  Good pulses but no edema.  NEUROLOGICAL:  Normal.   IMPRESSION:  1. Tachy palpitations with dizziness.  2. Atypical chest discomfort in a patient with multiple risk factors      including:      a.     Strong family history.      b.     Hypertension, treated.      c.     Hyperlipidemia, treated.   EKG is within normal limits.   PLAN:  A stress Myoview and an event monitor.  I will plan to see her in two  weeks concerning this.  She will probably need a beta blocker, depending on  the findings  of the stress test and event monitor.   Thanks, once again, for the opportunity to share in this nice patient's  care.    Sincerely,     ______________________________  E. Graceann Congress, MD, [facility]    EJL/MedQ  DD: 05/28/2006  DT: 05/29/2006  Job #: [id number]

## 2010-12-21 NOTE — Letter (Signed)
June 10, 2006     Lovenia Kim, D.O.  [address]  [place], [place] [address]   RE:  [name], [name]  MRN:  [id number]  /  DOB:  10/12/1945   Dear Amy:   Your nice patient Kimberly Cook was seen for followup on June 10, 2006.  As you know she has a history of tachy palpitations with dizziness and a  strong family history of coronary artery disease, hypertension,  hyperlipidemia.   Her stress test revealed limited exercise tolerance, continued through only  4 minutes of [name] protocol.  However, there were no ST changes.  Her heart  rate was up to 141, and there was no sign of scar or ischemia.   The patient states that she has been feeling better, but she feels that most  of her symptoms are related to stress with the illness of her husband and  with her work.  She is on Zocor 40, Maxzide 75/50, Ambien, Prilosec,  Lexapro, Accupril 20, aspirin.   Blood pressure 128/83, pulse 69, normal sinus rhythm.  GENERAL:  Normal.  JVPs are elevated.  Carotid pulses are palpated equal without bruits.  LUNGS:  Clear.  CARDIAC:  Normal with no murmur, gallop, or rub.  EXTREMITIES:  No edema.   IMPRESSION:  Diagnoses as above.   I have reassured the patient that she appears to be doing well.  There is no  evidence of ischemia.   Should she continue to have chest discomfort, I would be happy to see her  again.  Otherwise, I will see her again in 4 months.  I have also suggested  a 2D echo in the interim.  Thanks for the opportunity of seeing this nice  patient.    Sincerely,     ______________________________  E. Graceann Congress, MD, [facility]    EJL/MedQ  DD: 06/10/2006  DT: 06/10/2006  Job #: [id number]

## 2010-12-21 NOTE — Assessment & Plan Note (Signed)
[facility]                              CARDIOLOGY OFFICE NOTE   NAME:Kimberly Cook, Kimberly Cook                      MRN:          [id number]  DATE:05/28/2006                            DOB:          08-May-1946    ADDENDUM:  Her medications include:  1. Zocor 40.  2. Maxzide 75/50.  3. Ambien 10.  4. Prilosec 20.  5. Aleve.  6. Lexapro 10.  7. Accupril 20.  8. Aspirin 81, which she started a few weeks ago with the encouragement of      her sister.    ______________________________  E. Graceann Congress, MD, [facility]    EJL/MedQ  DD: 05/28/2006  DT: 05/29/2006  Job #: [id number]

## 2011-02-16 LAB — HM COLONOSCOPY

## 2011-05-06 LAB — BASIC METABOLIC PANEL
BUN: 18
BUN: 24 — ABNORMAL HIGH
BUN: 41 — ABNORMAL HIGH
CO2: 24
CO2: 29
CO2: 30
Calcium: 8.4
Calcium: 8.9
Calcium: 9.2
Chloride: 100
Chloride: 103
Chloride: 104
Creatinine, Ser: 1.27 — ABNORMAL HIGH
Creatinine, Ser: 1.27 — ABNORMAL HIGH
Creatinine, Ser: 1.65 — ABNORMAL HIGH
GFR calc Af Amer: 38 — ABNORMAL LOW
GFR calc Af Amer: 51 — ABNORMAL LOW
GFR calc Af Amer: 51 — ABNORMAL LOW
GFR calc non Af Amer: 31 — ABNORMAL LOW
GFR calc non Af Amer: 43 — ABNORMAL LOW
GFR calc non Af Amer: 43 — ABNORMAL LOW
Glucose, Bld: 91
Glucose, Bld: 96
Glucose, Bld: 97
Potassium: 3.4 — ABNORMAL LOW
Potassium: 3.8
Potassium: 4.4
Sodium: 134 — ABNORMAL LOW
Sodium: 137
Sodium: 140

## 2011-05-06 LAB — URINE MICROSCOPIC-ADD ON

## 2011-05-06 LAB — CULTURE, BLOOD (ROUTINE X 2)
Culture: NO GROWTH
Culture: NO GROWTH

## 2011-05-06 LAB — COMPREHENSIVE METABOLIC PANEL
ALT: 30
AST: 33
Albumin: 3.8
Alkaline Phosphatase: 89
BUN: 51 — ABNORMAL HIGH
CO2: 23
Calcium: 9.6
Chloride: 88 — ABNORMAL LOW
Creatinine, Ser: 2.2 — ABNORMAL HIGH
GFR calc Af Amer: 27 — ABNORMAL LOW
GFR calc non Af Amer: 23 — ABNORMAL LOW
Glucose, Bld: 151 — ABNORMAL HIGH
Potassium: 2.8 — ABNORMAL LOW
Sodium: 131 — ABNORMAL LOW
Total Bilirubin: 1.3 — ABNORMAL HIGH
Total Protein: 8.4 — ABNORMAL HIGH

## 2011-05-06 LAB — CK TOTAL AND CKMB (NOT AT ARMC)
CK, MB: 0.8
Relative Index: INVALID
Total CK: 35

## 2011-05-06 LAB — HEMOGLOBIN A1C
Hgb A1c MFr Bld: 5.6
Mean Plasma Glucose: 114

## 2011-05-06 LAB — DIFFERENTIAL
Basophils Absolute: 0.1
Basophils Absolute: 0.1
Basophils Relative: 1
Basophils Relative: 1
Eosinophils Absolute: 0
Eosinophils Absolute: 0.1
Eosinophils Relative: 0
Eosinophils Relative: 2
Lymphocytes Relative: 15
Lymphocytes Relative: 23
Lymphs Abs: 1.3
Lymphs Abs: 1.6
Monocytes Absolute: 0.4
Monocytes Absolute: 0.5
Monocytes Relative: 5
Monocytes Relative: 6
Neutro Abs: 4.7
Neutro Abs: 6.9
Neutrophils Relative %: 67
Neutrophils Relative %: 79 — ABNORMAL HIGH

## 2011-05-06 LAB — CBC
HCT: 31.1 — ABNORMAL LOW
HCT: 32.8 — ABNORMAL LOW
HCT: 41.2
Hemoglobin: 10.6 — ABNORMAL LOW
Hemoglobin: 11.4 — ABNORMAL LOW
Hemoglobin: 14.3
MCHC: 34.1
MCHC: 34.7
MCHC: 35
MCV: 90.6
MCV: 91.3
MCV: 91.4
Platelets: 203
Platelets: 237
Platelets: 366
RBC: 3.4 — ABNORMAL LOW
RBC: 3.6 — ABNORMAL LOW
RBC: 4.55
RDW: 12
RDW: 12.1
RDW: 12.2
WBC: 4.6
WBC: 6.9
WBC: 8.7

## 2011-05-06 LAB — URINALYSIS, ROUTINE W REFLEX MICROSCOPIC
Bilirubin Urine: NEGATIVE
Glucose, UA: NEGATIVE
Hgb urine dipstick: NEGATIVE
Ketones, ur: NEGATIVE
Nitrite: NEGATIVE
Protein, ur: NEGATIVE
Specific Gravity, Urine: 1.016
Urobilinogen, UA: 0.2
pH: 5.5

## 2011-05-06 LAB — TROPONIN I: Troponin I: 0.03

## 2011-05-06 LAB — CARDIAC PANEL(CRET KIN+CKTOT+MB+TROPI)
CK, MB: 0.7
CK, MB: 1.8
Relative Index: INVALID
Relative Index: INVALID
Total CK: 31
Total CK: 51
Troponin I: 0.02
Troponin I: 0.03

## 2011-05-06 LAB — LIPID PANEL
Cholesterol: 111
HDL: 14 — ABNORMAL LOW
LDL Cholesterol: 71
Total CHOL/HDL Ratio: 7.9
Triglycerides: 131
VLDL: 26

## 2011-05-06 LAB — IRON AND TIBC
Iron: 116
Saturation Ratios: 48
TIBC: 241 — ABNORMAL LOW
UIBC: 125

## 2011-05-06 LAB — CALCIUM: Calcium: 8.9

## 2011-05-06 LAB — T4, FREE: Free T4: 1.72

## 2011-05-06 LAB — FERRITIN: Ferritin: 479 — ABNORMAL HIGH (ref 10–291)

## 2011-05-06 LAB — RETICULOCYTES
RBC.: 3.55 — ABNORMAL LOW
Retic Count, Absolute: 46.2
Retic Ct Pct: 1.3

## 2011-05-06 LAB — URINE CULTURE
Colony Count: NO GROWTH
Culture: NO GROWTH

## 2011-05-06 LAB — CK: Total CK: 34

## 2011-05-06 LAB — PHOSPHORUS: Phosphorus: 2.7

## 2011-05-06 LAB — B-NATRIURETIC PEPTIDE (CONVERTED LAB): Pro B Natriuretic peptide (BNP): <30

## 2011-05-06 LAB — TSH: TSH: 7.088 — ABNORMAL HIGH

## 2011-05-06 LAB — FOLATE: Folate: 8.2

## 2011-05-06 LAB — MAGNESIUM: Magnesium: 2.2

## 2011-05-06 LAB — VITAMIN B12: Vitamin B-12: 534 (ref 211–911)

## 2011-06-03 LAB — HM COLONOSCOPY

## 2011-06-25 ENCOUNTER — Other Ambulatory Visit (HOSPITAL_COMMUNITY): Payer: Self-pay | Admitting: Internal Medicine

## 2011-06-25 ENCOUNTER — Ambulatory Visit (HOSPITAL_COMMUNITY)
Admission: RE | Admit: 2011-06-25 | Discharge: 2011-06-25 | Disposition: A | Discharge status: Home / Self Care | Payer: Medicare Other | Source: Ambulatory Visit | Attending: Internal Medicine | Admitting: Internal Medicine

## 2011-06-25 DIAGNOSIS — M25559 Pain in unspecified hip: Secondary | ICD-10-CM

## 2011-06-25 DIAGNOSIS — M549 Dorsalgia, unspecified: Secondary | ICD-10-CM

## 2011-06-25 DIAGNOSIS — M51379 Other intervertebral disc degeneration, lumbosacral region without mention of lumbar back pain or lower extremity pain: Secondary | ICD-10-CM | POA: Insufficient documentation

## 2011-06-25 DIAGNOSIS — M47817 Spondylosis without myelopathy or radiculopathy, lumbosacral region: Secondary | ICD-10-CM | POA: Insufficient documentation

## 2011-06-25 DIAGNOSIS — M5137 Other intervertebral disc degeneration, lumbosacral region: Secondary | ICD-10-CM | POA: Insufficient documentation

## 2012-05-18 LAB — HM MAMMOGRAPHY

## 2013-06-23 ENCOUNTER — Other Ambulatory Visit: Payer: Self-pay | Admitting: Gastroenterology

## 2013-06-23 DIAGNOSIS — R1310 Dysphagia, unspecified: Secondary | ICD-10-CM

## 2013-06-30 ENCOUNTER — Telehealth: Payer: Self-pay | Admitting: Internal Medicine

## 2013-06-30 NOTE — Telephone Encounter (Signed)
Pt wanted to see if DrMck would call something in for her back pain and for her arthritis, she is having a lot of pain with it. She cant come in-problems with her car Please call pt back at [phone] Pharm she use is [facility] on [address]. Sending chart back

## 2013-06-30 NOTE — Telephone Encounter (Signed)
Called pt back= dr Madison Hickman said to try otc-aleve up to 5 tabs a day. Pt said she would try that.

## 2013-07-07 ENCOUNTER — Ambulatory Visit
Admission: RE | Admit: 2013-07-07 | Discharge: 2013-07-07 | Disposition: A | Discharge status: Home / Self Care | Payer: Medicare Other | Source: Ambulatory Visit | Attending: Gastroenterology | Admitting: Gastroenterology

## 2013-07-07 DIAGNOSIS — R1310 Dysphagia, unspecified: Secondary | ICD-10-CM

## 2013-07-19 ENCOUNTER — Other Ambulatory Visit: Payer: Self-pay | Admitting: Internal Medicine

## 2013-07-20 ENCOUNTER — Other Ambulatory Visit: Payer: Self-pay | Admitting: Gastroenterology

## 2013-07-20 NOTE — Telephone Encounter (Signed)
Left message with pt to sch ov

## 2013-07-26 ENCOUNTER — Other Ambulatory Visit: Payer: Self-pay | Admitting: Internal Medicine

## 2013-07-26 NOTE — Telephone Encounter (Signed)
Klonopin Rx called into [facility]

## 2013-08-18 ENCOUNTER — Encounter: Payer: Self-pay | Admitting: Physician Assistant

## 2013-08-18 ENCOUNTER — Ambulatory Visit: Payer: Self-pay | Admitting: Physician Assistant

## 2013-08-18 DIAGNOSIS — E559 Vitamin D deficiency, unspecified: Secondary | ICD-10-CM | POA: Insufficient documentation

## 2013-08-18 DIAGNOSIS — E782 Mixed hyperlipidemia: Secondary | ICD-10-CM | POA: Insufficient documentation

## 2013-08-18 DIAGNOSIS — K219 Gastro-esophageal reflux disease without esophagitis: Secondary | ICD-10-CM | POA: Insufficient documentation

## 2013-08-18 DIAGNOSIS — F32A Depression, unspecified: Secondary | ICD-10-CM | POA: Insufficient documentation

## 2013-08-18 DIAGNOSIS — F329 Major depressive disorder, single episode, unspecified: Secondary | ICD-10-CM | POA: Insufficient documentation

## 2013-08-18 DIAGNOSIS — R7303 Prediabetes: Secondary | ICD-10-CM | POA: Insufficient documentation

## 2013-08-18 DIAGNOSIS — E785 Hyperlipidemia, unspecified: Secondary | ICD-10-CM | POA: Insufficient documentation

## 2013-08-19 ENCOUNTER — Encounter: Payer: Self-pay | Admitting: Physician Assistant

## 2013-08-19 ENCOUNTER — Ambulatory Visit (INDEPENDENT_AMBULATORY_CARE_PROVIDER_SITE_OTHER): Payer: Medicare Other | Admitting: Physician Assistant

## 2013-08-19 VITALS — BP 120/70 | HR 68 | Temp 97.7°F | Resp 16 | Wt 178.0 lb

## 2013-08-19 DIAGNOSIS — F32A Depression, unspecified: Secondary | ICD-10-CM

## 2013-08-19 DIAGNOSIS — F3289 Other specified depressive episodes: Secondary | ICD-10-CM

## 2013-08-19 DIAGNOSIS — R7303 Prediabetes: Secondary | ICD-10-CM

## 2013-08-19 DIAGNOSIS — I10 Essential (primary) hypertension: Secondary | ICD-10-CM

## 2013-08-19 DIAGNOSIS — Z79899 Other long term (current) drug therapy: Secondary | ICD-10-CM

## 2013-08-19 DIAGNOSIS — E559 Vitamin D deficiency, unspecified: Secondary | ICD-10-CM

## 2013-08-19 DIAGNOSIS — E785 Hyperlipidemia, unspecified: Secondary | ICD-10-CM

## 2013-08-19 DIAGNOSIS — F329 Major depressive disorder, single episode, unspecified: Secondary | ICD-10-CM

## 2013-08-19 LAB — LIPID PANEL
Cholesterol: 142 mg/dL (ref 0–200)
HDL: 39 mg/dL — ABNORMAL LOW (ref >39)
LDL Cholesterol: 75 mg/dL (ref 0–99)
Total CHOL/HDL Ratio: 3.6 Ratio
Triglycerides: 141 mg/dL (ref <150)
VLDL: 28 mg/dL (ref 0–40)

## 2013-08-19 LAB — HEPATIC FUNCTION PANEL
ALT: 20 U/L (ref 0–35)
AST: 22 U/L (ref 0–37)
Albumin: 4.1 g/dL (ref 3.5–5.2)
Alkaline Phosphatase: 76 U/L (ref 39–117)
Bilirubin, Direct: 0.2 mg/dL (ref 0.0–0.3)
Indirect Bilirubin: 0.6 mg/dL (ref 0.0–0.9)
Total Bilirubin: 0.8 mg/dL (ref 0.3–1.2)
Total Protein: 6.7 g/dL (ref 6.0–8.3)

## 2013-08-19 LAB — BASIC METABOLIC PANEL WITH GFR
BUN: 19 mg/dL (ref 6–23)
CO2: 25 mEq/L (ref 19–32)
Calcium: 9.3 mg/dL (ref 8.4–10.5)
Chloride: 104 mEq/L (ref 96–112)
Creat: 0.92 mg/dL (ref 0.50–1.10)
GFR, Est African American: 74 mL/min
GFR, Est Non African American: 64 mL/min
Glucose, Bld: 84 mg/dL (ref 70–99)
Potassium: 3.9 mEq/L (ref 3.5–5.3)
Sodium: 139 mEq/L (ref 135–145)

## 2013-08-19 LAB — CBC WITH DIFFERENTIAL/PLATELET
Basophils Absolute: 0 10*3/uL (ref 0.0–0.1)
Basophils Relative: 0 % (ref 0–1)
Eosinophils Absolute: 0.1 10*3/uL (ref 0.0–0.7)
Eosinophils Relative: 3 % (ref 0–5)
HCT: 41 % (ref 36.0–46.0)
Hemoglobin: 14.3 g/dL (ref 12.0–15.0)
Lymphocytes Relative: 29 % (ref 12–46)
Lymphs Abs: 1.3 10*3/uL (ref 0.7–4.0)
MCH: 30.7 pg (ref 26.0–34.0)
MCHC: 34.9 g/dL (ref 30.0–36.0)
MCV: 88 fL (ref 78.0–100.0)
Monocytes Absolute: 0.3 10*3/uL (ref 0.1–1.0)
Monocytes Relative: 6 % (ref 3–12)
Neutro Abs: 2.9 10*3/uL (ref 1.7–7.7)
Neutrophils Relative %: 62 % (ref 43–77)
Platelets: 172 10*3/uL (ref 150–400)
RBC: 4.66 MIL/uL (ref 3.87–5.11)
RDW: 13.4 % (ref 11.5–15.5)
WBC: 4.6 10*3/uL (ref 4.0–10.5)

## 2013-08-19 LAB — MAGNESIUM: Magnesium: 1.7 mg/dL (ref 1.5–2.5)

## 2013-08-19 LAB — HEMOGLOBIN A1C
Hgb A1c MFr Bld: 5.3 % (ref <5.7)
Mean Plasma Glucose: 105 mg/dL (ref <117)

## 2013-08-19 MED ORDER — TRAZODONE HCL 150 MG PO TABS: 150.0000 mg | ORAL_TABLET | Freq: Every day | ORAL | Status: DC

## 2013-08-19 MED ORDER — SERTRALINE HCL 100 MG PO TABS: 100.0000 mg | ORAL_TABLET | Freq: Every day | ORAL | Status: DC

## 2013-08-19 MED ORDER — AZITHROMYCIN 250 MG PO TABS: ORAL_TABLET | ORAL | Status: AC

## 2013-08-19 NOTE — Patient Instructions (Signed)
The problem of recurrent insomnia is discussed. Avoidance of caffeine sources is strongly encouraged. Sleep hygiene issues are reviewed. The use of sedative hypnotics for temporary relief is appropriate; we discussed the addictive nature of these drugs, and a one-time only prescription for prn use of a hypnotic is given, to use no more than 3 times per week for 2-3 weeks.  Try 150mg  zoloft daily Try trazodone at night  Call if it is not better  Diet for Gastroesophageal Reflux Disease, Adult Reflux (acid reflux) is when acid from your stomach flows up into the esophagus. When acid comes in contact with the esophagus, the acid causes irritation and soreness (inflammation) in the esophagus. When reflux happens often or so severely that it causes damage to the esophagus, it is called gastroesophageal reflux disease (GERD). Nutrition therapy can help ease the discomfort of GERD. FOODS OR DRINKS TO AVOID OR LIMIT Smoking or chewing tobacco. Nicotine is one of the most potent stimulants to acid production in the gastrointestinal tract. Caffeinated and decaffeinated coffee and black tea. Regular or low-calorie carbonated beverages or energy drinks (caffeine-free carbonated beverages are allowed).  Strong spices, such as black pepper, white pepper, red pepper, cayenne, curry powder, and chili powder. Peppermint or spearmint. Chocolate. High-fat foods, including meats and fried foods. Extra added fats including oils, butter, salad dressings, and nuts. Limit these to less than 8 tsp per day. Fruits and vegetables if they are not tolerated, such as citrus fruits or tomatoes. Alcohol. Any food that seems to aggravate your condition. If you have questions regarding your diet, call your caregiver or a registered dietitian. OTHER THINGS THAT MAY HELP GERD INCLUDE:  Eating your meals slowly, in a relaxed setting. Eating 5 to 6 small meals per day instead of 3 large meals. Eliminating food for a period of  time if it causes distress. Not lying down until 3 hours after eating a meal. Keeping the head of your bed raised 6 to 9 inches (15 to 23 cm) by using a foam wedge or blocks under the legs of the bed. Lying flat may make symptoms worse. Being physically active. Weight loss may be helpful in reducing reflux in overweight or obese adults. Wear loose fitting clothing EXAMPLE MEAL PLAN This meal plan is approximately 2,000 calories based on https://www.bernard.org/ChooseMyPlate.gov meal planning guidelines. Breakfast  cup cooked oatmeal. 1 cup strawberries. 1 cup low-fat milk. 1 oz almonds. Snack 1 cup cucumber slices. 6 oz yogurt (made from low-fat or fat-free milk). Lunch 2 slice whole-wheat bread. 2 oz sliced [place]urkey. 2 tsp mayonnaise. 1 cup blueberries. 1 cup snap peas. Snack 6 whole-wheat crackers. 1 oz string cheese. Dinner  cup brown rice. 1 cup mixed veggies. 1 tsp olive oil. 3 oz grilled fish. Document Released: 07/22/2005 Document Revised: 10/14/2011 Document Reviewed: 06/07/2011 [facility] Patient Information 2014 [place], [place]. Sleep Apnea  Sleep apnea is a sleep disorder characterized by abnormal pauses in breathing while you sleep. When your breathing pauses, the level of oxygen in your blood decreases. This causes you to move out of deep sleep and into light sleep. As a result, your quality of sleep is poor, and the system that carries your blood throughout your body (cardiovascular system) experiences stress. If sleep apnea remains untreated, the following conditions can develop:  High blood pressure (hypertension).  Coronary artery disease.  Inability to achieve or maintain an erection (impotence).  Impairment of your thought process (cognitive dysfunction). There are three types of sleep apnea: 1. Obstructive sleep apnea Pauses  in breathing during sleep because of a blocked airway. 2. Central sleep apnea Pauses in breathing during sleep because the area of the brain that controls  your breathing does not send the correct signals to the muscles that control breathing. 3. Mixed sleep apnea A combination of both obstructive and central sleep apnea. RISK FACTORS The following risk factors can increase your risk of developing sleep apnea:  Being overweight.  Smoking.  Having narrow passages in your nose and throat.  Being of older age.  Being female.  Alcohol use.  Sedative and tranquilizer use.  Ethnicity. Among individuals younger than 35 years, African Americans are at increased risk of sleep apnea. SYMPTOMS   Difficulty staying asleep.  Daytime sleepiness and fatigue.  Loss of energy.  Irritability.  Loud, heavy snoring.  Morning headaches.  Trouble concentrating.  Forgetfulness.  Decreased interest in sex. DIAGNOSIS  In order to diagnose sleep apnea, your caregiver will perform a physical examination. Your caregiver may suggest that you take a home sleep test. Your caregiver may also recommend that you spend the night in a sleep lab. In the sleep lab, several monitors record information about your heart, lungs, and brain while you sleep. Your leg and arm movements and blood oxygen level are also recorded. TREATMENT The following actions may help to resolve mild sleep apnea:  Sleeping on your side.   Using a decongestant if you have nasal congestion.   Avoiding the use of depressants, including alcohol, sedatives, and narcotics.   Losing weight and modifying your diet if you are overweight. There also are devices and treatments to help open your airway:  Oral appliances. These are custom-made mouthpieces that shift your lower jaw forward and slightly open your bite. This opens your airway.  Devices that create positive airway pressure. This positive pressure "splints" your airway open to help you breathe better during sleep. The following devices create positive airway pressure:  Continuous positive airway pressure (CPAP) device. The  CPAP device creates a continuous level of air pressure with an air pump. The air is delivered to your airway through a mask while you sleep. This continuous pressure keeps your airway open.  Nasal expiratory positive airway pressure (EPAP) device. The EPAP device creates positive air pressure as you exhale. The device consists of single-use valves, which are inserted into each nostril and held in place by adhesive. The valves create very little resistance when you inhale but create much more resistance when you exhale. That increased resistance creates the positive airway pressure. This positive pressure while you exhale keeps your airway open, making it easier to breath when you inhale again.  Bilevel positive airway pressure (BPAP) device. The BPAP device is used mainly in patients with central sleep apnea. This device is similar to the CPAP device because it also uses an air pump to deliver continuous air pressure through a mask. However, with the BPAP machine, the pressure is set at two different levels. The pressure when you exhale is lower than the pressure when you inhale.  Surgery. Typically, surgery is only done if you cannot comply with less invasive treatments or if the less invasive treatments do not improve your condition. Surgery involves removing excess tissue in your airway to create a wider passage way. Document Released: 07/12/2002 Document Revised: 11/16/2012 Document Reviewed: 11/28/2011 [facility] Patient Information 2014 [place], [place].

## 2013-08-19 NOTE — Progress Notes (Signed)
HPI Patient presents for 3 month follow up with hypertension, hyperlipidemia, prediabetes and vitamin D. Patient's blood pressure has been controlled at home, today their BP is BP: 120/70 mmHg  Patient denies chest pain, shortness of breath, dizziness.  Patient's cholesterol is diet controlled. In addition they are on lipitor 1/2 pill 3 times a week and denies myalgias. The cholesterol last visit was LDL 65. The patient has been working on diet and exercise for prediabetes, and denies changes in vision, polys, and paresthesias. A1C 5.3.  Saw Dr. Bosie ClosSchooler recently and had EGD that showed gastritis and small HH. She is now on protonix rather than omeprazole and states it is helping.  Patient is on Vitamin D supplement.   She is on klonopin 2 mg for sleep but states it is not helping her. She states she gets very anxious/worries often, she is still on the Zoloft 100. She denies suicidal ideations. She worries about her sons both of which are truck drivers, and one is married and moving her to be closer to her which stressed her with packing.   She has a very crowded mouth, insomnia, snores, fatigue, dry mouth.  Current Medications:  Current Outpatient Prescriptions on File Prior to Visit  Medication Sig Dispense Refill  . aspirin 325 MG tablet Take 325 mg by mouth daily.      Marland Kitchen. atorvastatin (LIPITOR) 40 MG tablet Take 40 mg by mouth daily. 1 daily or as directed by a doctor      . cholecalciferol (VITAMIN D) 1000 UNITS tablet Take 1,000 Units by mouth daily.      . clonazePAM (KLONOPIN) 2 MG tablet TAKE ONE-HALF TO ONE TABLET BY MOUTH ONCE DAILY AT BEDTIME AS NEEDED FOR  SLEEP  30 tablet  0  . levothyroxine (SYNTHROID, LEVOTHROID) 100 MCG tablet TAKE ONE TABLET BY MOUTH EVERY DAY  90 tablet  0  . lisinopril (PRINIVIL,ZESTRIL) 20 MG tablet Take 20 mg by mouth daily.      Marland Kitchen. omeprazole (PRILOSEC) 40 MG capsule Take 40 mg by mouth daily.      . sertraline (ZOLOFT) 100 MG tablet Take 100 mg by mouth  daily.       No current facility-administered medications on file prior to visit.   Medical History:  Past Medical History  Diagnosis Date  . Hyperlipidemia   . GERD (gastroesophageal reflux disease)   . Arthritis   . Depression   . Vitamin D deficiency   . Prediabetes   . Occipital neuralgia   . Hypertension     Normal cardiolite 05/2006 EF 71%   Allergies: No Known Allergies  ROS Constitutional: + fatigue  Denies fever, chills, headaches, insomnia,night sweats Eyes: Denies redness, blurred vision, diplopia, discharge, itchy, watery eyes.  ENT: + post nasal drip, sinus pain x 1-2 weeks not getting better, +snoring Denies sore throat, earache, dental pain, Tinnitus, Vertigo Cardio: Denies chest pain, palpitations, irregular heartbeat, dyspnea, diaphoresis, orthopnea, PND, claudication, edema Respiratory:+ cough and wheezing x1 week denies shortness of breath  Gastrointestinal: Denies dysphagia, heartburn, AB pain/ cramps, N/V, diarrhea, constipation, hematemesis, melena, hematochezia,  hemorrhoids Genitourinary: Denies dysuria, frequency, urgency, nocturia, hesitancy, discharge, hematuria, flank pain Musculoskeletal: Denies myalgia, stiffness, pain, swelling and strain/sprain. Skin: Denies pruritis, rash, changing in skin lesion Neuro: Denies Weakness, tremor, incoordination, spasms, pain Psychiatric: + anxiety/depression Denies confusion, memory loss, sensory loss Endocrine: Denies change in weight, skin, hair change, nocturia Diabetic Polys, Denies visual blurring, hyper /hypo glycemic episodes, and paresthesia, Heme/Lymph: Denies Excessive bleeding, bruising,  enlarged lymph nodes  Family history- Review and unchanged Social history- Review and unchanged Physical Exam: Filed Vitals:   08/19/13 1129  BP: 120/70  Pulse: 68  Temp: 97.7 F (36.5 C)  Resp: 16   Filed Weights   08/19/13 1129  Weight: 178 lb (80.74 kg)   General Appearance: Well nourished, in no apparent  distress. Eyes: PERRLA, EOMs, conjunctiva no swelling or erythema Sinuses: No Frontal/maxillary tenderness ENT/Mouth: Ext aud canals clear, TMs without erythema, bulging. No erythema, swelling, or exudate on post pharynx.  Tonsils not swollen or erythematous. Hearing normal.  Neck: Supple, thyroid normal.  Respiratory: Respiratory effort normal, BS equal bilaterally with diffuse expiratory wheezing without rales, rhonchi, or stridor.  Cardio: RRR with no MRGs. Brisk peripheral pulses without edema.  Abdomen: Soft, obese + BS.  Non tender, no guarding, rebound, hernias, masses. Lymphatics: Non tender without lymphadenopathy.  Musculoskeletal: Full ROM, 5/5 strength, normal gait.  Skin: Warm, dry without rashes, lesions, ecchymosis.  Neuro: Cranial nerves intact. Normal muscle tone, no cerebellar symptoms. Sensation intact.  Psych: Awake and oriented X 3, normal affect, Insight and Judgment appropriate.   Assessment and Plan:  Hypertension: Continue medication, monitor blood pressure at home.  Continue DASH diet. Cholesterol: Continue diet and exercise. Check cholesterol.  Pre-diabetes-Continue diet and exercise. Check A1C Vitamin D Def- check level and continue medications.  Depression/anxiety- suggest going up on zoloft to 150mg  and if that does not help we will add wellbutrin to zoloft at follow up Insomnia-? Sleep apnea- suggest sleep study but patient declines at this time even thought I went into length the increase risk of MI, Stroke, CHF, PHTN, try trazadone 150mg    Continue diet and meds as discussed. Further disposition pending results of labs. follow up in one month OVER 40 minutes of exam, counseling, chart review, referral performed   Quentin Mulling 11:51 AM

## 2013-08-20 LAB — TSH: TSH: 3.702 u[IU]/mL (ref 0.350–4.500)

## 2013-08-23 ENCOUNTER — Telehealth: Payer: Self-pay

## 2013-08-23 NOTE — Telephone Encounter (Signed)
Return call to patient, patient states that she was advised to start 100 mg Zoloft but she was already on that, per Marchelle Folks advised patient to do 150 mg daily, will get new RX at new follow up states she has enough t00 mg t break in half for now

## 2013-09-07 ENCOUNTER — Other Ambulatory Visit: Payer: Self-pay | Admitting: Physician Assistant

## 2013-09-07 MED ORDER — TEMAZEPAM 30 MG PO CAPS: 30.0000 mg | ORAL_CAPSULE | Freq: Every evening | ORAL | Status: DC | PRN

## 2013-09-20 ENCOUNTER — Ambulatory Visit: Payer: Self-pay | Admitting: Physician Assistant

## 2013-10-04 ENCOUNTER — Telehealth: Payer: Self-pay | Admitting: Internal Medicine

## 2013-10-06 ENCOUNTER — Other Ambulatory Visit: Payer: Self-pay | Admitting: Physician Assistant

## 2013-10-06 ENCOUNTER — Ambulatory Visit: Payer: Self-pay | Admitting: Physician Assistant

## 2013-10-27 NOTE — Telephone Encounter (Signed)
ERROR

## 2013-11-10 ENCOUNTER — Encounter: Payer: Self-pay | Admitting: Physician Assistant

## 2013-11-10 ENCOUNTER — Ambulatory Visit (INDEPENDENT_AMBULATORY_CARE_PROVIDER_SITE_OTHER): Payer: Medicare Other | Admitting: Physician Assistant

## 2013-11-10 VITALS — BP 110/72 | HR 52 | Temp 97.5°F | Resp 16 | Ht 64.0 in | Wt 163.0 lb

## 2013-11-10 DIAGNOSIS — F32A Depression, unspecified: Secondary | ICD-10-CM

## 2013-11-10 DIAGNOSIS — F329 Major depressive disorder, single episode, unspecified: Secondary | ICD-10-CM

## 2013-11-10 DIAGNOSIS — I10 Essential (primary) hypertension: Secondary | ICD-10-CM

## 2013-11-10 DIAGNOSIS — K219 Gastro-esophageal reflux disease without esophagitis: Secondary | ICD-10-CM

## 2013-11-10 DIAGNOSIS — F3289 Other specified depressive episodes: Secondary | ICD-10-CM

## 2013-11-10 DIAGNOSIS — Z0000 Encounter for general adult medical examination without abnormal findings: Secondary | ICD-10-CM

## 2013-11-10 DIAGNOSIS — E119 Type 2 diabetes mellitus without complications: Secondary | ICD-10-CM

## 2013-11-10 DIAGNOSIS — Z79899 Other long term (current) drug therapy: Secondary | ICD-10-CM

## 2013-11-10 DIAGNOSIS — E785 Hyperlipidemia, unspecified: Secondary | ICD-10-CM

## 2013-11-10 DIAGNOSIS — R7303 Prediabetes: Secondary | ICD-10-CM

## 2013-11-10 DIAGNOSIS — E559 Vitamin D deficiency, unspecified: Secondary | ICD-10-CM

## 2013-11-10 LAB — CBC WITH DIFFERENTIAL/PLATELET
Basophils Absolute: 0.1 10*3/uL (ref 0.0–0.1)
Basophils Relative: 1 % (ref 0–1)
Eosinophils Absolute: 0.1 10*3/uL (ref 0.0–0.7)
Eosinophils Relative: 1 % (ref 0–5)
HCT: 44 % (ref 36.0–46.0)
Hemoglobin: 15.2 g/dL — ABNORMAL HIGH (ref 12.0–15.0)
Lymphocytes Relative: 31 % (ref 12–46)
Lymphs Abs: 1.9 10*3/uL (ref 0.7–4.0)
MCH: 30.3 pg (ref 26.0–34.0)
MCHC: 34.5 g/dL (ref 30.0–36.0)
MCV: 87.6 fL (ref 78.0–100.0)
Monocytes Absolute: 0.5 10*3/uL (ref 0.1–1.0)
Monocytes Relative: 8 % (ref 3–12)
Neutro Abs: 3.6 10*3/uL (ref 1.7–7.7)
Neutrophils Relative %: 59 % (ref 43–77)
Platelets: 220 10*3/uL (ref 150–400)
RBC: 5.02 MIL/uL (ref 3.87–5.11)
RDW: 14.4 % (ref 11.5–15.5)
WBC: 6.1 10*3/uL (ref 4.0–10.5)

## 2013-11-10 LAB — LIPID PANEL
Cholesterol: 208 mg/dL — ABNORMAL HIGH (ref 0–200)
HDL: 50 mg/dL (ref >39)
LDL Cholesterol: 128 mg/dL — ABNORMAL HIGH (ref 0–99)
Total CHOL/HDL Ratio: 4.2 Ratio
Triglycerides: 149 mg/dL (ref <150)
VLDL: 30 mg/dL (ref 0–40)

## 2013-11-10 LAB — HEPATIC FUNCTION PANEL
ALT: 33 U/L (ref 0–35)
AST: 29 U/L (ref 0–37)
Albumin: 4.2 g/dL (ref 3.5–5.2)
Alkaline Phosphatase: 100 U/L (ref 39–117)
Bilirubin, Direct: 0.1 mg/dL (ref 0.0–0.3)
Indirect Bilirubin: 0.6 mg/dL (ref 0.2–1.2)
Total Bilirubin: 0.7 mg/dL (ref 0.2–1.2)
Total Protein: 6.8 g/dL (ref 6.0–8.3)

## 2013-11-10 LAB — BASIC METABOLIC PANEL WITH GFR
BUN: 26 mg/dL — ABNORMAL HIGH (ref 6–23)
CO2: 24 mEq/L (ref 19–32)
Calcium: 10.1 mg/dL (ref 8.4–10.5)
Chloride: 101 mEq/L (ref 96–112)
Creat: 1.13 mg/dL — ABNORMAL HIGH (ref 0.50–1.10)
GFR, Est African American: 57 mL/min — ABNORMAL LOW
GFR, Est Non African American: 50 mL/min — ABNORMAL LOW
Glucose, Bld: 96 mg/dL (ref 70–99)
Potassium: 4.7 mEq/L (ref 3.5–5.3)
Sodium: 135 mEq/L (ref 135–145)

## 2013-11-10 LAB — TSH: TSH: 2.693 u[IU]/mL (ref 0.350–4.500)

## 2013-11-10 LAB — MAGNESIUM: Magnesium: 2 mg/dL (ref 1.5–2.5)

## 2013-11-10 MED ORDER — SERTRALINE HCL 100 MG PO TABS: 100.0000 mg | ORAL_TABLET | Freq: Every day | ORAL | Status: DC

## 2013-11-10 MED ORDER — LISINOPRIL 20 MG PO TABS: 20.0000 mg | ORAL_TABLET | Freq: Every day | ORAL | Status: DC

## 2013-11-10 MED ORDER — ATORVASTATIN CALCIUM 40 MG PO TABS: 40.0000 mg | ORAL_TABLET | Freq: Every day | ORAL | Status: DC

## 2013-11-10 MED ORDER — TEMAZEPAM 30 MG PO CAPS: ORAL_CAPSULE | ORAL | Status: DC

## 2013-11-10 MED ORDER — OMEPRAZOLE 40 MG PO CPDR
40.0000 mg | DELAYED_RELEASE_CAPSULE | Freq: Every day | ORAL | Status: DC
Start: 2013-11-10 — End: 2014-08-08

## 2013-11-10 MED ORDER — LEVOTHYROXINE SODIUM 100 MCG PO TABS: ORAL_TABLET | ORAL | Status: DC

## 2013-11-10 NOTE — Progress Notes (Signed)
Subjective:   Kimberly Cook is a 68 y.o. female who presents for Medicare Annual Wellness Visit and 3 month follow up on hypertension,  hyperlipidemia, vitamin D def.  Date of last medicare wellness visit is unknown.   Her blood pressure has been controlled at home, today their BP is BP: 110/72 mmHg She does not workout. She denies chest pain, shortness of breath, dizziness.  She is on cholesterol medication and denies myalgias. Her cholesterol is at goal. The cholesterol last visit was:   Lab Results  Component Value Date   CHOL 142 08/19/2013   HDL 39* 08/19/2013   LDLCALC 75 08/19/2013   TRIG 141 08/19/2013   CHOLHDL 3.6 08/19/2013   Patient is on Vitamin D supplement. She has insomnia and is doing better on the Restoril 30 mg, still encourage weight loss and sleep apnea.  She has lost 20 lbs over a year or so without trying.  No depression symptoms but increasing stress with moving from her home to a house next to her son.   Names of Other Physician/Practitioners you currently use: 1. [facility]- here for primary care 2. [facility] doctor, last visit 2012 Patient Care Team: Lucky CowboyWilliam McKeown, MD as PCP - General (Internal Medicine) Shirley FriarVincent C. Schooler, MD as Consulting Physician (Gastroenterology) Nilda Simmerobert A Wainer, MD as Consulting Physician (Orthopedic Surgery)  Medication Review Current Outpatient Prescriptions on File Prior to Visit  Medication Sig Dispense Refill  . aspirin 325 MG tablet Take 325 mg by mouth daily.      Marland Kitchen. atorvastatin (LIPITOR) 40 MG tablet Take 40 mg by mouth daily. 1 daily or as directed by a doctor      . cholecalciferol (VITAMIN D) 1000 UNITS tablet Take 1,000 Units by mouth daily.      . clonazePAM (KLONOPIN) 2 MG tablet TAKE ONE-HALF TO ONE TABLET BY MOUTH ONCE DAILY AT BEDTIME AS NEEDED FOR  SLEEP  30 tablet  0  . levothyroxine (SYNTHROID, LEVOTHROID) 100 MCG tablet TAKE ONE TABLET BY MOUTH EVERY DAY   90 tablet  0  . lisinopril (PRINIVIL,ZESTRIL) 20 MG tablet Take 20 mg by mouth daily.      Marland Kitchen. omeprazole (PRILOSEC) 40 MG capsule Take 40 mg by mouth daily.      . sertraline (ZOLOFT) 100 MG tablet Take 1 tablet (100 mg total) by mouth daily.  180 tablet  1  . temazepam (RESTORIL) 30 MG capsule TAKE ONE CAPSULE BY MOUTH ONCE DAILY AT BEDTIME AS NEEDED FOR SLEEP  30 capsule  0  . traZODone (DESYREL) 150 MG tablet Take 1 tablet (150 mg total) by mouth at bedtime.  30 tablet  0   No current facility-administered medications on file prior to visit.    Current Problems (verified) Patient Active Problem List   Diagnosis Date Noted  . Hypertension   . Hyperlipidemia   . GERD (gastroesophageal reflux disease)   . Vitamin D deficiency   . Depression   . Prediabetes     Screening Tests Health Maintenance  Topic Date Due  . Influenza Vaccine  03/05/2014  . Mammogram  05/18/2014  . Tetanus/tdap  08/19/2015  . Colonoscopy  02/15/2021  . Pneumococcal Polysaccharide Vaccine Age 69 And Over  Completed  . Zostavax  Completed     Immunization History  Administered Date(s) Administered  . Influenza-Unspecified 04/18/2013  . Pneumococcal-Unspecified 08/18/2010  . Td 08/18/2005  . Zoster 08/18/2005    Preventative care: Last colonoscopy: 2012 due  2022 Last mammogram: 03/2013 Last pap smear/pelvic exam: 2011 DEXA: 03/2013- osteopenia  History reviewed: allergies, current medications, past family history, past medical history, past social history, past surgical history and problem list  Risk Factors: Osteoporosis: postmenopausal estrogen deficiency and dietary calcium and/or vitamin D deficiency History of fracture in the past year: no  Tobacco History  Substance Use Topics  . Smoking status: Never Smoker   . Smokeless tobacco: Never Used  . Alcohol Use: No   She does not smoke.  Patient is not a former smoker. Are there smokers in your home (other than you)?   No  Alcohol Current alcohol use: none  Caffeine Current caffeine use: coffee 1-2 /day  Exercise Exercise limitations: The patient has no exercise limitations. Current exercise: none  Nutrition/Diet Current diet: in general, a "healthy" diet    Cardiac risk factors: advanced age (older than 8 for men, 90 for women), dyslipidemia, hypertension, obesity (BMI >= 30 kg/m2) and sedentary lifestyle.  Depression Screen (Note: if answer to either of the following is "Yes", a more complete depression screening is indicated)   Q1: Over the past two weeks, have you felt down, depressed or hopeless? No  Q2: Over the past two weeks, have you felt little interest or pleasure in doing things? No  Have you lost interest or pleasure in daily life? No  Do you often feel hopeless? No  Do you cry easily over simple problems? No  Activities of Daily Living In your present state of health, do you have any difficulty performing the following activities?:  Driving? No Managing money?  No Feeding yourself? No Getting from bed to chair? No Climbing a flight of stairs? No Preparing food and eating?: No Bathing or showering? No Getting dressed: No Getting to the toilet? No Using the toilet:No Moving around from place to place: No In the past year have you fallen or had a near fall?:Yes   Are you sexually active?  No  Do you have more than one partner?  No  Vision Difficulties: No  Hearing Difficulties: No Do you often ask people to speak up or repeat themselves? No Do you experience ringing or noises in your ears? No Do you have difficulty understanding soft or whispered voices? No  Cognition  Do you feel that you have a problem with memory?No  Do you often misplace items? No  Do you feel safe at home?  Yes  Advanced directives Does patient have a Health Care Power of Attorney? Yes Does patient have a Living Will? Yes    Objective:   Blood pressure 110/72, pulse 52, temperature  97.5 F (36.4 C), resp. rate 16, height 5\' 4"  (1.626 m), weight 163 lb (73.936 kg). Body mass index is 27.97 kg/(m^2).  Wt Readings from Last 3 Encounters:  11/10/13 163 lb (73.936 kg)  08/19/13 178 lb (80.74 kg)    General appearance: alert, no distress, WD/WN,  female Cognitive Testing  Alert? Yes  Normal Appearance?Yes  Oriented to person? Yes  Place? Yes   Time? Yes  Recall of three objects?  Yes  Can perform simple calculations? Yes  Displays appropriate judgment?Yes  Can read the correct time from a watch face?Yes  HEENT: normocephalic, sclerae anicteric, TMs pearly, nares patent, no discharge or erythema, pharynx normal Oral cavity: MMM, no lesions Neck: supple, no lymphadenopathy, no thyromegaly, no masses Heart: RRR, normal S1, S2, no murmurs Lungs: CTA bilaterally, no wheezes, rhonchi, or rales Abdomen: +bs, soft, non tender, non distended, no  masses, no hepatomegaly, no splenomegaly Musculoskeletal: nontender, no swelling, no obvious deformity Extremities: no edema, no cyanosis, no clubbing Pulses: 2+ symmetric, upper and lower extremities, normal cap refill Neurological: alert, oriented x 3, CN2-12 intact, strength normal upper extremities and lower extremities, sensation normal throughout, DTRs 2+ throughout, no cerebellar signs, gait normal Psychiatric: normal affect, behavior normal, pleasant  Breast: defer Gyn: defer Rectal: defer   Assessment:   1. Hypertension - CBC with Differential - BASIC METABOLIC PANEL WITH GFR - Hepatic function panel - TSH  2. GERD (gastroesophageal reflux disease) Continue meds  3. Prediabetes She is doing well with weight loss  4. Hyperlipidemia - Lipid panel  5. Vitamin D deficiency  6. Encounter for long-term (current) use of other medications - Magnesium   Plan:   During the course of the visit the patient was educated and counseled about appropriate screening and preventive services including:    Influenza  vaccine  Screening electrocardiogram  Screening mammography  Bone densitometry screening  Colorectal cancer screening  Diabetes screening  Glaucoma screening  Nutrition counseling   Screening recommendations, referrals:  Vaccinations: Tdap vaccine not indicated Influenza vaccine not indicated Pneumococcal vaccine not indicated Shingles vaccine not indicated Hep B vaccine not indicated  Nutrition assessed and recommended  Colonoscopy not indicated Mammogram not indicated Pap smear not indicated Pelvic exam not indicated Recommended yearly ophthalmology/optometry visit for glaucoma screening and checkup Recommended yearly dental visit for hygiene and checkup Advanced directives - patient has this done at home  Conditions/risks identified: BMI: Discussed weight loss, diet, and increase physical activity.  Increase physical activity: AHA recommends 150 minutes of physical activity a week.  Medications reviewed DEXA- due 2016 Diabetes is at goal, not diabetic Urinary Incontinence is not an issue: discussed non pharmacology and pharmacology options.  Fall risk: high- discussed PT, home fall assessment, medications.   Medicare Attestation I have personally reviewed: The patient's medical and social history Their use of alcohol, tobacco or illicit drugs Their current medications and supplements The patient's functional ability including ADLs,fall risks, home safety risks, cognitive, and hearing and visual impairment Diet and physical activities Evidence for depression or mood disorders  The patient's weight, height, BMI, and visual acuity have been recorded in the chart.  I have made referrals, counseling, and provided education to the patient based on review of the above and I have provided the patient with a written personalized care plan for preventive services.     Quentin Mulling, PA-C   11/10/2013

## 2013-11-10 NOTE — Patient Instructions (Signed)

## 2013-12-21 ENCOUNTER — Encounter: Payer: Self-pay | Admitting: Emergency Medicine

## 2013-12-21 ENCOUNTER — Ambulatory Visit (INDEPENDENT_AMBULATORY_CARE_PROVIDER_SITE_OTHER): Payer: Medicare Other | Admitting: Emergency Medicine

## 2013-12-21 VITALS — BP 148/92 | HR 94 | Temp 98.2°F | Resp 18 | Ht 64.0 in | Wt 163.0 lb

## 2013-12-21 DIAGNOSIS — M25562 Pain in left knee: Secondary | ICD-10-CM

## 2013-12-21 DIAGNOSIS — M549 Dorsalgia, unspecified: Secondary | ICD-10-CM

## 2013-12-21 DIAGNOSIS — M25569 Pain in unspecified knee: Secondary | ICD-10-CM

## 2013-12-21 DIAGNOSIS — I10 Essential (primary) hypertension: Secondary | ICD-10-CM

## 2013-12-21 MED ORDER — HYDROCODONE-ACETAMINOPHEN 5-325 MG PO TABS: 1.0000 | ORAL_TABLET | Freq: Four times a day (QID) | ORAL | Status: DC | PRN

## 2013-12-21 MED ORDER — METHYLPREDNISOLONE (PAK) 4 MG PO TABS: 4.0000 mg | ORAL_TABLET | Freq: Every day | ORAL | Status: DC

## 2013-12-21 NOTE — Patient Instructions (Signed)

## 2013-12-21 NOTE — Progress Notes (Signed)
Subjective:    Patient ID: Kimberly Cook, female    DOB: Apr 03, 1946, 68 y.o.   MRN: [id number]  HPI Comments: 68 yo WF with fall 4 weeks ago with ER evaluation. She did have xrays that were negative except for arthritis in Knee and back. She cannot see ORtho due to unpaid bills at [facility] with filing bankruptcy. She has had arthroscopic left knee surgery in past. She notes knee pain is worse than back. She notes + swelling, pain, and weakness in left knee. She has had tylenol w/o relief. She notes Norco helped with pain from ER. She wants new ORTHO referral.   She has been off all RX X 2 month. She notes BP is up because of this but mood has been stable. She has not been sleeping well due to pain and is mildly fatigued.   Knee Pain   Fall     Medication List       This list is accurate as of: 12/21/13 11:56 AM.  Always use your most recent med list.               aspirin 325 MG tablet  Take 325 mg by mouth daily.     atorvastatin 40 MG tablet  Commonly known as:  LIPITOR  Take 1 tablet (40 mg total) by mouth daily.     cholecalciferol 1000 UNITS tablet  Commonly known as:  VITAMIN D  Take 1,000 Units by mouth daily.     HYDROcodone-acetaminophen 5-325 MG per tablet  Commonly known as:  NORCO  Take 1 tablet by mouth every 6 (six) hours as needed for moderate pain.     levothyroxine 100 MCG tablet  Commonly known as:  SYNTHROID, LEVOTHROID  TAKE ONE TABLET BY MOUTH EVERY DAY     lisinopril 20 MG tablet  Commonly known as:  PRINIVIL,ZESTRIL  Take 1 tablet (20 mg total) by mouth daily.     omeprazole 40 MG capsule  Commonly known as:  PRILOSEC  Take 1 capsule (40 mg total) by mouth daily.     sertraline 100 MG tablet  Commonly known as:  ZOLOFT  Take 1 tablet (100 mg total) by mouth daily.        No Known Allergies Past Medical History  Diagnosis Date  . Hyperlipidemia   . GERD (gastroesophageal reflux disease)   . Arthritis   . Depression   . Vitamin D  deficiency   . Prediabetes   . Occipital neuralgia   . Hypertension     Normal cardiolite 05/2006 EF 71%      Review of Systems  Constitutional: Positive for fatigue.  Musculoskeletal: Positive for arthralgias, back pain and joint swelling.  All other systems reviewed and are negative.  BP 148/92  Pulse 94  Temp(Src) 98.2 F (36.8 C) (Temporal)  Resp 18  Ht 5\' 4"  (1.626 m)  Wt 163 lb (73.936 kg)  BMI 27.97 kg/m2      Objective:   Physical Exam  Nursing note and vitals reviewed. Constitutional: She is oriented to person, place, and time. She appears well-developed and well-nourished.  HENT:  Head: Normocephalic and atraumatic.  Eyes: Conjunctivae are normal.  Neck: Normal range of motion.  Cardiovascular: Normal rate, regular rhythm, normal heart sounds and intact distal pulses.   Pulmonary/Chest: Effort normal and breath sounds normal.  Abdominal: Soft. Bowel sounds are normal. She exhibits no distension. There is no tenderness.  Musculoskeletal: Normal range of motion. She exhibits edema and tenderness.  Left knee and l5/s1  Neurological: She is alert and oriented to person, place, and time. She has normal reflexes. No cranial nerve deficit. Coordination normal.  Skin: Skin is warm and dry.  Psychiatric: She has a normal mood and affect. Judgment normal.          Assessment & Plan:  1. HTN- Check BP call if >130/80, increase cardio. Restart Lisinopril AD. DO NOT restart Zoloft. Start SX of Crestor 10 mg and Synthroid 100 mg SX X 1 month given  2. Left knee pain-[facility].I.C.E call with any concerns  [place] 5 mg [place]  3. Back pain with recent fall- Medrol DP AD. Advised of hygiene/ stretching

## 2014-01-10 ENCOUNTER — Encounter: Payer: Self-pay | Admitting: Internal Medicine

## 2014-01-10 ENCOUNTER — Ambulatory Visit (INDEPENDENT_AMBULATORY_CARE_PROVIDER_SITE_OTHER): Payer: Medicare Other | Admitting: Internal Medicine

## 2014-01-10 ENCOUNTER — Emergency Department (HOSPITAL_COMMUNITY)
Admission: EM | Admit: 2014-01-10 | Discharge: 2014-01-11 | Disposition: A | Discharge status: Home / Self Care | Payer: Medicare Other | Attending: Emergency Medicine | Admitting: Emergency Medicine

## 2014-01-10 ENCOUNTER — Encounter (HOSPITAL_COMMUNITY): Payer: Self-pay | Admitting: Emergency Medicine

## 2014-01-10 ENCOUNTER — Other Ambulatory Visit: Payer: Self-pay

## 2014-01-10 VITALS — BP 144/96 | HR 80 | Temp 97.5°F | Resp 16 | Ht 64.0 in | Wt 165.4 lb

## 2014-01-10 DIAGNOSIS — I10 Essential (primary) hypertension: Secondary | ICD-10-CM | POA: Insufficient documentation

## 2014-01-10 DIAGNOSIS — E785 Hyperlipidemia, unspecified: Secondary | ICD-10-CM | POA: Insufficient documentation

## 2014-01-10 DIAGNOSIS — F411 Generalized anxiety disorder: Secondary | ICD-10-CM | POA: Insufficient documentation

## 2014-01-10 DIAGNOSIS — Z91199 Patient's noncompliance with other medical treatment and regimen due to unspecified reason: Secondary | ICD-10-CM

## 2014-01-10 DIAGNOSIS — R0602 Shortness of breath: Secondary | ICD-10-CM | POA: Insufficient documentation

## 2014-01-10 DIAGNOSIS — R002 Palpitations: Secondary | ICD-10-CM | POA: Insufficient documentation

## 2014-01-10 DIAGNOSIS — G4700 Insomnia, unspecified: Secondary | ICD-10-CM

## 2014-01-10 DIAGNOSIS — F419 Anxiety disorder, unspecified: Secondary | ICD-10-CM

## 2014-01-10 DIAGNOSIS — E559 Vitamin D deficiency, unspecified: Secondary | ICD-10-CM | POA: Insufficient documentation

## 2014-01-10 DIAGNOSIS — Z79899 Other long term (current) drug therapy: Secondary | ICD-10-CM | POA: Insufficient documentation

## 2014-01-10 DIAGNOSIS — M129 Arthropathy, unspecified: Secondary | ICD-10-CM | POA: Insufficient documentation

## 2014-01-10 DIAGNOSIS — Z8739 Personal history of other diseases of the musculoskeletal system and connective tissue: Secondary | ICD-10-CM | POA: Insufficient documentation

## 2014-01-10 DIAGNOSIS — N3000 Acute cystitis without hematuria: Secondary | ICD-10-CM

## 2014-01-10 DIAGNOSIS — K219 Gastro-esophageal reflux disease without esophagitis: Secondary | ICD-10-CM | POA: Insufficient documentation

## 2014-01-10 DIAGNOSIS — Z9119 Patient's noncompliance with other medical treatment and regimen: Secondary | ICD-10-CM | POA: Insufficient documentation

## 2014-01-10 DIAGNOSIS — F3289 Other specified depressive episodes: Secondary | ICD-10-CM | POA: Insufficient documentation

## 2014-01-10 DIAGNOSIS — K5900 Constipation, unspecified: Secondary | ICD-10-CM | POA: Insufficient documentation

## 2014-01-10 DIAGNOSIS — F329 Major depressive disorder, single episode, unspecified: Secondary | ICD-10-CM | POA: Insufficient documentation

## 2014-01-10 DIAGNOSIS — R0789 Other chest pain: Secondary | ICD-10-CM

## 2014-01-10 DIAGNOSIS — Z7982 Long term (current) use of aspirin: Secondary | ICD-10-CM | POA: Insufficient documentation

## 2014-01-10 LAB — CBC
HCT: 35.6 % — ABNORMAL LOW (ref 36.0–46.0)
Hemoglobin: 12.4 g/dL (ref 12.0–15.0)
MCH: 30.5 pg (ref 26.0–34.0)
MCHC: 34.8 g/dL (ref 30.0–36.0)
MCV: 87.7 fL (ref 78.0–100.0)
Platelets: 174 10*3/uL (ref 150–400)
RBC: 4.06 MIL/uL (ref 3.87–5.11)
RDW: 13 % (ref 11.5–15.5)
WBC: 6.7 10*3/uL (ref 4.0–10.5)

## 2014-01-10 LAB — I-STAT TROPONIN, ED: Troponin i, poc: 0.01 ng/mL (ref 0.00–0.08)

## 2014-01-10 LAB — CBC WITH DIFFERENTIAL/PLATELET
Basophils Absolute: 0.1 10*3/uL (ref 0.0–0.1)
Basophils Relative: 1 % (ref 0–1)
Eosinophils Absolute: 0.2 10*3/uL (ref 0.0–0.7)
Eosinophils Relative: 4 % (ref 0–5)
HCT: 37.7 % (ref 36.0–46.0)
Hemoglobin: 13.3 g/dL (ref 12.0–15.0)
Lymphocytes Relative: 20 % (ref 12–46)
Lymphs Abs: 1.1 10*3/uL (ref 0.7–4.0)
MCH: 31.3 pg (ref 26.0–34.0)
MCHC: 35.3 g/dL (ref 30.0–36.0)
MCV: 88.7 fL (ref 78.0–100.0)
Monocytes Absolute: 0.3 10*3/uL (ref 0.1–1.0)
Monocytes Relative: 6 % (ref 3–12)
Neutro Abs: 3.9 10*3/uL (ref 1.7–7.7)
Neutrophils Relative %: 69 % (ref 43–77)
Platelets: 176 10*3/uL (ref 150–400)
RBC: 4.25 MIL/uL (ref 3.87–5.11)
RDW: 14.1 % (ref 11.5–15.5)
WBC: 5.6 10*3/uL (ref 4.0–10.5)

## 2014-01-10 LAB — BASIC METABOLIC PANEL
BUN: 21 mg/dL (ref 6–23)
CO2: 21 mEq/L (ref 19–32)
Calcium: 9.8 mg/dL (ref 8.4–10.5)
Chloride: 99 mEq/L (ref 96–112)
Creatinine, Ser: 0.89 mg/dL (ref 0.50–1.10)
GFR calc Af Amer: 75 mL/min — ABNORMAL LOW (ref >90)
GFR calc non Af Amer: 65 mL/min — ABNORMAL LOW (ref >90)
Glucose, Bld: 99 mg/dL (ref 70–99)
Potassium: 3.8 mEq/L (ref 3.7–5.3)
Sodium: 138 mEq/L (ref 137–147)

## 2014-01-10 LAB — TSH: TSH: 2.89 u[IU]/mL (ref 0.350–4.500)

## 2014-01-10 MED ORDER — LISINOPRIL 20 MG PO TABS: 20.0000 mg | ORAL_TABLET | Freq: Every day | ORAL | Status: DC

## 2014-01-10 MED ORDER — TEMAZEPAM 30 MG PO CAPS: 30.0000 mg | ORAL_CAPSULE | Freq: Every evening | ORAL | Status: DC | PRN

## 2014-01-10 NOTE — Progress Notes (Signed)
Patient ID: Kimberly Cook, female   DOB: 05-18-1946, 68 y.o.   MRN: [id number]    This very nice 68 y.o.WWF presents for 1 month follow up with Hypertension, Hyperlipidemia, Pre-Diabetes and Vitamin D Deficiency. Patient relates financial difficulties after her son recently married and moved out and she has had to downsize and move to a smaller apartment. Also she apparently has had difficulties affording her medications.    HTN predates since 2012.  Patient was seen about a month ago for a knee injury and apparently had been off of several of her meds and was re-started on her BP, Chol and Thyroid meds and presents today for reassessment. BP has been controlled at home. Today's BP: 144/96 mmHg. Patient denies any cardiac type chest pain, palpitations, dyspnea/orthopnea/PND, dizziness, claudication, or dependent edema.   Hyperlipidemia is controlled with diet & meds. Last lipids as below were not at goal as patient had been off of her medications and admits poor dietary compliance. Patient denies myalgias or other med SE's.  Lab Results  Component Value Date   CHOL 208* 11/10/2013   HDL 50 11/10/2013   LDLCALC 409128* 11/10/2013   TRIG 149 11/10/2013   CHOLHDL 4.2 11/10/2013    Also, the patient has history of PreDiabetes/insulin resistance with A1c 5.3%% & elevated Insulin of 39 in Aug 2014 and last A1c was 5.3% in Jan 2015. Patient denies any symptoms of reactive hypoglycemia, diabetic polys, paresthesias or visual blurring.    Further, Patient has history of Vitamin D Deficiency of 37 in 2012 with last vitamin D was 60 in Aug 2014.Marland Kitchen. Patient supplements vitamin D without any suspected side-effects.   Medication List   aspirin 325 MG tablet  Take 325 mg by mouth daily.     atorvastatin 40 MG tablet  Commonly known as:  LIPITOR  Take 1 tablet (40 mg total) by mouth daily.     celecoxib 100 MG capsule  Commonly known as:  CELEBREX  Take by mouth.     cholecalciferol 1000 UNITS tablet  Commonly  known as:  VITAMIN D  Take 1,000 Units by mouth daily.     HYDROcodone-acetaminophen 5-325 MG per tablet  Commonly known as:  NORCO  Take 1 tablet by mouth every 6 (six) hours as needed for moderate pain.     levothyroxine 100 MCG tablet  Commonly known as:  SYNTHROID, LEVOTHROID  TAKE ONE TABLET BY MOUTH EVERY DAY     lisinopril 20 MG tablet  Commonly known as:  PRINIVIL,ZESTRIL  Take 1 tablet (20 mg total) by mouth daily. Take 1 tablet daily for BP     metoprolol tartrate 25 MG tablet  Commonly known as:  LOPRESSOR  Take by mouth.     omeprazole 40 MG capsule  Commonly known as:  PRILOSEC  Take 1 capsule (40 mg total) by mouth daily.     temazepam 30 MG capsule  Commonly known as:  RESTORIL  Take 1 capsule (30 mg total) by mouth at bedtime as needed for sleep.        No Known Allergies  PMHx:   Past Medical History  Diagnosis Date  . Hyperlipidemia   . GERD (gastroesophageal reflux disease)   . Arthritis   . Depression   . Vitamin D deficiency   . Prediabetes   . Occipital neuralgia   . Hypertension     Normal cardiolite 05/2006 EF 71%   FHx:    Reviewed / unchanged  SHx:    Reviewed /  unchanged   Systems Review: Constitutional: Denies fever, chills, wt changes, headaches, insomnia, fatigue, night sweats, change in appetite. Eyes: Denies redness, blurred vision, diplopia, discharge, itchy, watery eyes.  ENT: Denies discharge, congestion, post nasal drip, epistaxis, sore throat, earache, hearing loss, dental pain, tinnitus, vertigo, sinus pain, snoring.  CV: Denies chest pain, palpitations, irregular heartbeat, syncope, dyspnea, diaphoresis, orthopnea, PND, claudication or edema. Respiratory: denies cough, dyspnea, DOE, pleurisy, hoarseness, laryngitis, wheezing.  Gastrointestinal: Denies dysphagia, odynophagia, heartburn, reflux, water brash, abdominal pain or cramps, nausea, vomiting, bloating, diarrhea, constipation, hematemesis, melena, hematochezia  or  hemorrhoids. Genitourinary: C/o dysuria, frequency, urgency without nocturia, hesitancy, discharge, hematuria or flank pain. Musculoskeletal: Denies arthralgias, myalgias, stiffness, jt. swelling, pain, limping or strain/sprain.  Skin: Denies pruritus, rash, hives, warts, acne, eczema or change in skin lesion(s). Neuro: No weakness, tremor, incoordination, spasms, paresthesia or pain. Psychiatric: Denies confusion, memory loss or sensory loss. Endo: Denies change in weight, skin or hair change.  Heme/Lymph: No excessive bleeding, bruising or enlarged lymph nodes.  Exam:  BP 144/96  P 80  T 97.5 F   Resp 16  Ht 5\' 4"    Wt 165 lb 6.4 oz   BMI 28.38 kg/m2  Appears well nourished - in no distress. Eyes: PERRLA, EOMs, conjunctiva no swelling or erythema. Sinuses: No frontal/maxillary tenderness ENT/Mouth: EAC's clear, TM's nl w/o erythema, bulging. Nares clear w/o erythema, swelling, exudates. Oropharynx clear without erythema or exudates. Oral hygiene is good. Tongue normal, non obstructing. Hearing intact.  Neck: Supple. Thyroid nl. Car 2+/2+ without bruits, nodes or JVD. Chest: Respirations nl with BS clear & equal w/o rales, rhonchi, wheezing or stridor.  Cor: Heart sounds normal w/ regular rate and rhythm without sig. murmurs, gallops, clicks, or rubs. Peripheral pulses normal and equal  without edema.  Abdomen: Soft & bowel sounds normal. Non-tender w/o guarding, rebound, hernias, masses, or organomegaly.  Lymphatics: Unremarkable.  Musculoskeletal: Full ROM all peripheral extremities, joint stability, 5/5 strength, and normal gait.  Skin: Warm, dry without exposed rashes, lesions or ecchymosis apparent.  Neuro: Cranial nerves intact, reflexes equal bilaterally. Sensory-motor testing grossly intact. Tendon reflexes grossly intact.  Pysch: Alert & oriented x 3. Insight and judgement nl & appropriate. No ideations.  Assessment and Plan:  1. Hypertension - Continue monitor blood  pressure at home. Continue diet/meds same.  2. Hyperlipidemia - Continue diet/meds, exercise,& lifestyle modifications. Continue monitor periodic cholesterol/liver & renal functions   3. Pre-diabetes/Insulin Resistance - Continue diet, exercise, lifestyle modifications. Monitor appropriate labs..  4. Vitamin D Deficiency - Continue supplementation.  Recommended regular exercise, BP monitoring, weight control, and discussed med and SE's. Recommended labs to assess and monitor clinical status. Further disposition pending results of labs.

## 2014-01-10 NOTE — Patient Instructions (Signed)

## 2014-01-10 NOTE — ED Notes (Signed)
Pt family pulled this RN to the side and state that pt has been more anxious past couple of weeks, pt family state that pt feels like "she needs more attention and isn't recieving that because some family has moved away." Pt was seen at pcp today for work up and was told nothing was abnormal. Was started on new anxiety medicine.

## 2014-01-10 NOTE — ED Notes (Signed)
Patient explains that she has been unable to sleep well for about 2 months. PCP started patient on Temazepam. Today patient took one capsule and has been feeling jittery, weak, short of breath, and experiencing chest heaviness since that time.

## 2014-01-11 ENCOUNTER — Emergency Department (HOSPITAL_COMMUNITY): Payer: Medicare Other

## 2014-01-11 LAB — URINALYSIS, MICROSCOPIC ONLY
Bacteria, UA: NONE SEEN
Casts: NONE SEEN
Crystals: NONE SEEN
Squamous Epithelial / LPF: NONE SEEN

## 2014-01-11 LAB — TROPONIN I: Troponin I: <0.3 ng/mL (ref <0.30)

## 2014-01-11 MED ORDER — POLYETHYLENE GLYCOL 3350 17 G PO PACK: 17.0000 g | PACK | Freq: Every day | ORAL | Status: DC

## 2014-01-11 NOTE — ED Notes (Signed)
Patient returned from xray.

## 2014-01-11 NOTE — ED Provider Notes (Signed)
CSN: [id number]     Arrival date & time 01/10/14  2218 History   First MD Initiated Contact with Patient 01/11/14 0028     Chief Complaint  Patient presents with  . Shortness of Breath  . Palpitations  . Chest Pain     (Consider location/radiation/quality/duration/timing/severity/associated sxs/prior Treatment) HPI 68 year old female presents to emergency department from home with complaint of shortness of breath palpitations and chest pressure.  Patient reports for the last 2 months she has had difficulties with sleeping, poor appetite, anxiety and constipation she saw her primary care doctor today who told her it was mostly omental, and started her on Niaspan to help with sleep.  She has taken this before with good health for insomnia.  Tonight, however soon after taking it she started to sleep and then jerked awake.  Patient felt short of breath, she felt pressure in her chest and she felt as though she is going to die.  This is the feeling that has kept her awake for last 2 months.  Every time she tries to fall asleep she jerked herself awake.  Patient has had a big life changes over the last year or 2.  Her husband has died, her son who is living with her about married and moved out.  Family feels that the patient is depressed.  She had been on Zoloft in the past, but is no longer on it.  Patient still feels anxious but denies any chest pressure or shortness of breath currently.  Symptoms lasted for about 2 hours. Past Medical History  Diagnosis Date  . Hyperlipidemia   . GERD (gastroesophageal reflux disease)   . Arthritis   . Depression   . Vitamin D deficiency   . Prediabetes   . Occipital neuralgia   . Hypertension     Normal cardiolite 05/2006 EF 71%   Past Surgical History  Procedure Laterality Date  . Abdominal hysterectomy    . Breast biopsy Right over 20 years     benign  . Cholecystectomy    . Bladder surgery    . Tonsillectomy and adenoidectomy    . Knee arthroscopy  Left 2011   Family History  Problem Relation Age of Onset  . Heart disease Mother   . Hypertension Mother   . Diabetes Mother   . Heart disease Father   . Cancer Maternal Aunt     breast   History  Substance Use Topics  . Smoking status: Never Smoker   . Smokeless tobacco: Never Used  . Alcohol Use: No   OB History   Grav Para Term Preterm Abortions TAB SAB Ect Mult Living                 Review of Systems   See History of Present Illness; otherwise all other systems are reviewed and negative  Allergies  Review of patient's allergies indicates no known allergies.  Home Medications   Prior to Admission medications   Medication Sig Start Date End Date Taking? Authorizing Provider  aspirin 325 MG tablet Take 325 mg by mouth daily.   Yes Historical Provider, MD  cholecalciferol (VITAMIN D) 1000 UNITS tablet Take 1,000 Units by mouth daily.   Yes Historical Provider, MD  HYDROcodone-acetaminophen (NORCO) 5-325 MG per tablet Take 1 tablet by mouth every 6 (six) hours as needed for moderate pain. 12/21/13  Yes Melissa R Smith, PA-C  levothyroxine (SYNTHROID, LEVOTHROID) 100 MCG tablet Take 100 mcg by mouth daily before breakfast.   Yes  Historical Provider, MD  lisinopril (PRINIVIL,ZESTRIL) 20 MG tablet Take 1 tablet (20 mg total) by mouth daily. Take 1 tablet daily for BP 01/10/14  Yes Lucky Cowboy, MD  omeprazole (PRILOSEC) 40 MG capsule Take 1 capsule (40 mg total) by mouth daily. 11/10/13  Yes Quentin Mulling, PA-C  rosuvastatin (CRESTOR) 10 MG tablet Take 10 mg by mouth daily.   Yes Historical Provider, MD  temazepam (RESTORIL) 30 MG capsule Take 1 capsule (30 mg total) by mouth at bedtime as needed for sleep. 01/10/14 05/12/14 Yes Lucky Cowboy, MD  polyethylene glycol [facility] / GLYCOLAX) packet Take 17 g by mouth daily. 01/11/14   Olivia Mackie, MD   BP 154/83  Pulse 81  Resp 18  Ht 5\' 5"  (1.651 m)  Wt 150 lb (68.04 kg)  BMI 24.96 kg/m2  SpO2 100% Physical Exam  Nursing  note and vitals reviewed. Constitutional: She is oriented to person, place, and time. She appears well-developed and well-nourished.  HENT:  Head: Normocephalic and atraumatic.  Right Ear: External ear normal.  Left Ear: External ear normal.  Nose: Nose normal.  Mouth/Throat: Oropharynx is clear and moist.  Eyes: Conjunctivae and EOM are normal. Pupils are equal, round, and reactive to light.  Neck: Normal range of motion. Neck supple. No JVD present. No tracheal deviation present. No thyromegaly present.  Cardiovascular: Normal rate, regular rhythm, normal heart sounds and intact distal pulses.  Exam reveals no gallop and no friction rub.   No murmur heard. Pulmonary/Chest: Effort normal and breath sounds normal. No stridor. No respiratory distress. She has no wheezes. She has no rales. She exhibits no tenderness.  Abdominal: Soft. Bowel sounds are normal. She exhibits no distension and no mass. There is no tenderness. There is no rebound and no guarding.  Musculoskeletal: Normal range of motion. She exhibits no edema and no tenderness.  Lymphadenopathy:    She has no cervical adenopathy.  Neurological: She is alert and oriented to person, place, and time. She has normal reflexes. No cranial nerve deficit. She exhibits normal muscle tone. Coordination normal.  Skin: Skin is dry. No rash noted. No erythema. No pallor.  Psychiatric: She has a normal mood and affect. Her behavior is normal. Judgment and thought content normal.    ED Course  Procedures (including critical care time) Labs Review Labs Reviewed  CBC - Abnormal; Notable for the following:    HCT 35.6 (*)    All other components within normal limits  BASIC METABOLIC PANEL - Abnormal; Notable for the following:    GFR calc non Af Amer 65 (*)    GFR calc Af Amer 75 (*)    All other components within normal limits  TROPONIN I  I-STAT TROPOININ, ED    Imaging Review Dg Chest 2 View  01/11/2014   CLINICAL DATA:  Chest pain  with shortness of breath.  EXAM: CHEST  2 VIEW  COMPARISON:  04/25/2008  FINDINGS: Mild cardiac enlargement. Retrocardiac lucency suggesting hiatal hernia. Lung fields are clear. Surgical clips around the GE junction. No osseous findings. Similar appearance to priors.  IMPRESSION: Mild cardiomegaly.  No active cardiopulmonary disease.   Electronically Signed   By: Davonna Belling M.D.   On: 01/11/2014 01:36     EKG Interpretation   Date/Time:  Tuesday January 11 2014 00:40:56 EDT Ventricular Rate:  70 PR Interval:  165 QRS Duration: 83 QT Interval:  436 QTC Calculation: 470 R Axis:   -5 Text Interpretation:  Sinus rhythm Low voltage,  precordial leads Normal  ECG Confirmed by Tajia Szeliga  MD, Oletta Buehring ([id number]) on 01/11/2014 12:43:09 AM      MDM   Final diagnoses:  Atypical chest pain  Anxiety  Insomnia  Constipation   68 year old female with multiple symptoms.  No signs of cardiac ischemia.  Doubt PE given her symptoms, no signs of infiltrate on her chest x-ray.  Suspected anxiety component, adjustment disorder possibly.  Patient given outpatient resources, instructed to followup with her primary care Dr.   Olivia Mackie, MD 01/11/14 [phone]

## 2014-01-11 NOTE — ED Notes (Signed)
Pt A&OX4, wheeled out of ED via wheelchair, NAD. 

## 2014-01-11 NOTE — Discharge Instructions (Signed)
Your workup today has not shown a specific cause for your symptoms.  Your labs, ekg, and xray were normal.  It appears a lot of your problems are stemming from stress, and possibly depression.  Increase your activity level to help with sleep at night.  Find a local provider/therapist to help talk through your problems.  Follow up with your doctor.   Chest Pain Observation It is often hard to give a specific diagnosis for the cause of chest pain. Among other possibilities your symptoms might be caused by inadequate oxygen delivery to your heart (angina). Angina that is not treated or evaluated can lead to a heart attack (myocardial infarction) or death. Blood tests, electrocardiograms, and X-rays may have been done to help determine a possible cause of your chest pain. After evaluation and observation, your health care provider has determined that it is unlikely your pain was caused by an unstable condition that requires hospitalization. However, a full evaluation of your pain may need to be completed, with additional diagnostic testing as directed. It is very important to keep your follow-up appointments. Not keeping your follow-up appointments could result in permanent heart damage, disability, or death. If there is any problem keeping your follow-up appointments, you must call your health care provider. HOME CARE INSTRUCTIONS  Due to the slight chance that your pain could be angina, it is important to follow your health care provider's treatment plan and also maintain a healthy lifestyle:  Maintain or work toward achieving a healthy weight.  Stay physically active and exercise regularly.  Decrease your salt intake.  Eat a balanced, healthy diet. Talk to a dietician to learn about heart healthy foods.  Increase your fiber intake by including whole grains, vegetables, fruits, and nuts in your diet.  Avoid situations that cause stress, anger, or depression.  Take medicines as advised by your  health care provider. Report any side effects to your health care provider. Do not stop medicines or adjust the dosages on your own.  Quit smoking. Do not use nicotine patches or gum until you check with your health care provider.  Keep your blood pressure, blood sugar, and cholesterol levels within normal limits.  Limit alcohol intake to no more than 1 drink per day for women that are not pregnant and 2 drinks per day for men.  Do not abuse drugs. SEEK IMMEDIATE MEDICAL CARE IF: You have severe chest pain or pressure which may include symptoms such as:  You feel pain or pressure in you arms, neck, jaw, or back.  You have severe back or abdominal pain, feel sick to your stomach (nauseous), or throw up (vomit).  You are sweating profusely.  You are having a fast or irregular heartbeat.  You feel short of breath while at rest.  You notice increasing shortness of breath during rest, sleep, or with activity.  You have chest pain that does not get better after rest or after taking your usual medicine.  You wake from sleep with chest pain.  You are unable to sleep because you cannot breathe.  You develop a frequent cough or you are coughing up blood.  You feel dizzy, faint, or experience extreme fatigue.  You develop severe weakness, dizziness, fainting, or chills. Any of these symptoms may represent a serious problem that is an emergency. Do not wait to see if the symptoms will go away. Call your local emergency services (911 in the [place] Do not drive yourself to the hospital. MAKE SURE YOU:  Understand these  instructions.  Will watch your condition.  Will get help right away if you are not doing well or get worse. Document Released: 08/24/2010 Document Revised: 03/24/2013 Document Reviewed: 01/21/2013 [facility] Patient Information 2014 [place], [place].   Constipation, Adult Constipation is when a person has fewer than 3 bowel movements a week; has difficulty having a bowel  movement; or has stools that are dry, hard, or larger than normal. As people grow older, constipation is more common. If you try to fix constipation with medicines that make you have a bowel movement (laxatives), the problem may get worse. Long-term laxative use may cause the muscles of the colon to become weak. A low-fiber diet, not taking in enough fluids, and taking certain medicines may make constipation worse. CAUSES   Certain medicines, such as antidepressants, pain medicine, iron supplements, antacids, and water pills.   Certain diseases, such as diabetes, irritable bowel syndrome (IBS), thyroid disease, or depression.   Not drinking enough water.   Not eating enough fiber-rich foods.   Stress or travel.  Lack of physical activity or exercise.  Not going to the restroom when there is the urge to have a bowel movement.  Ignoring the urge to have a bowel movement.  Using laxatives too much. SYMPTOMS   Having fewer than 3 bowel movements a week.   Straining to have a bowel movement.   Having hard, dry, or larger than normal stools.   Feeling full or bloated.   Pain in the lower abdomen.  Not feeling relief after having a bowel movement. DIAGNOSIS  Your caregiver will take a medical history and perform a physical exam. Further testing may be done for severe constipation. Some tests may include:   A barium enema X-ray to examine your rectum, colon, and sometimes, your small intestine.  A sigmoidoscopy to examine your lower colon.  A colonoscopy to examine your entire colon. TREATMENT  Treatment will depend on the severity of your constipation and what is causing it. Some dietary treatments include drinking more fluids and eating more fiber-rich foods. Lifestyle treatments may include regular exercise. If these diet and lifestyle recommendations do not help, your caregiver may recommend taking over-the-counter laxative medicines to help you have bowel movements.  Prescription medicines may be prescribed if over-the-counter medicines do not work.  HOME CARE INSTRUCTIONS   Increase dietary fiber in your diet, such as fruits, vegetables, whole grains, and beans. Limit high-fat and processed sugars in your diet, such as [place] fries, hamburgers, cookies, candies, and soda.   A fiber supplement may be added to your diet if you cannot get enough fiber from foods.   Drink enough fluids to keep your urine clear or pale yellow.   Exercise regularly or as directed by your caregiver.   Go to the restroom when you have the urge to go. Do not hold it.  Only take medicines as directed by your caregiver. Do not take other medicines for constipation without talking to your caregiver first. SEEK IMMEDIATE MEDICAL CARE IF:   You have bright red blood in your stool.   Your constipation lasts for more than 4 days or gets worse.   You have abdominal or rectal pain.   You have thin, pencil-like stools.  You have unexplained weight loss. MAKE SURE YOU:   Understand these instructions.  Will watch your condition.  Will get help right away if you are not doing well or get worse. Document Released: 04/19/2004 Document Revised: 10/14/2011 Document Reviewed: 05/03/2013 [employer] Patient Information  2014 [place], [place].  Fiber Content in Foods Drinking plenty of fluids and consuming foods high in fiber can help with constipation. See the list below for the fiber content of some common foods. Starches and Grains / Dietary Fiber (g)  Cheerios, 1 cup / 3 g  Kellogg's Corn Flakes, 1 cup / 0.7 g  Rice Krispies, 1  cup / 0.3 g  Quaker Oat Life Cereal,  cup / 2.1 g  Oatmeal, instant (cooked),  cup / 2 g  Kellogg's Frosted Mini Wheats, 1 cup / 5.1 g  Rice, brown, long-grain (cooked), 1 cup / 3.5 g  Rice, white, long-grain (cooked), 1 cup / 0.6 g  Macaroni, cooked, enriched, 1 cup / 2.5 g Legumes / Dietary Fiber (g)  Beans, baked, canned, plain  or vegetarian,  cup / 5.2 g  Beans, kidney, canned,  cup / 6.8 g  Beans, pinto, dried (cooked),  cup / 7.7 g  Beans, pinto, canned,  cup / 5.5 g Breads and Crackers / Dietary Fiber (g)  Graham crackers, plain or honey, 2 squares / 0.7 g  Saltine crackers, 3 squares / 0.3 g  Pretzels, plain, salted, 10 pieces / 1.8 g  Bread, whole-wheat, 1 slice / 1.9 g  Bread, white, 1 slice / 0.7 g  Bread, raisin, 1 slice / 1.2 g  Bagel, plain, 3 oz / 2 g  Tortilla, flour, 1 oz / 0.9 g  Tortilla, corn, 1 small / 1.5 g  Bun, hamburger or hotdog, 1 small / 0.9 g Fruits / Dietary Fiber (g)  Apple, raw with skin, 1 medium / 4.4 g  Applesauce, sweetened,  cup / 1.5 g  Banana,  medium / 1.5 g  Grapes, 10 grapes / 0.4 g  Orange, 1 small / 2.3 g  Raisin, 1.5 oz / 1.6 g  Melon, 1 cup / 1.4 g Vegetables / Dietary Fiber (g)  Green beans, canned,  cup / 1.3 g  Carrots (cooked),  cup / 2.3 g  Broccoli (cooked),  cup / 2.8 g  Peas, frozen (cooked),  cup / 4.4 g  Potatoes, mashed,  cup / 1.6 g  Lettuce, 1 cup / 0.5 g  Corn, canned,  cup / 1.6 g  Tomato,  cup / 1.1 g Document Released: 12/08/2006 Document Revised: 10/14/2011 Document Reviewed: 02/02/2007 [employer] Patient Information 2014 [place], [place].  Emotional Crisis Part of your problem today may be due to an emotional crisis. Emotional states can cause many different physical signs and symptoms. These may include:  Chest or stomach pain.  Fluttering heartbeat.  Passing out.  Breathing difficulty.  Headaches.  Trembling.  Hot or cold flashes.  Numbness.  Dizziness.  Unusual muscle pain or fatigue.  Insomnia. When you have other medical problems, they are often made worse by emotional upsets. Emotional crises can increase your stress and anxiety. Finding ways to reduce your stress level can make you feel better. You will become more capable of dealing with these emotional states. Regular  physical exercise such as walking can be very beneficial. Counseling or medicine to treat anxiety or depression may also be needed. See your caregiver if you have further problems or questions about your condition. Document Released: 07/22/2005 Document Revised: 10/14/2011 Document Reviewed: 01/06/2007 [facility] Patient Information 2014 [place], [place].

## 2014-01-11 NOTE — ED Notes (Signed)
Pt taken to xray 

## 2014-01-12 ENCOUNTER — Other Ambulatory Visit: Payer: Self-pay | Admitting: Emergency Medicine

## 2014-01-12 LAB — URINE CULTURE: Colony Count: 40000

## 2014-01-12 MED ORDER — SULFAMETHOXAZOLE-TMP DS 800-160 MG PO TABS: 1.0000 | ORAL_TABLET | Freq: Two times a day (BID) | ORAL | Status: DC

## 2014-02-14 ENCOUNTER — Ambulatory Visit: Payer: Self-pay

## 2014-02-17 ENCOUNTER — Ambulatory Visit (INDEPENDENT_AMBULATORY_CARE_PROVIDER_SITE_OTHER): Payer: Medicare Other | Admitting: Internal Medicine

## 2014-02-17 ENCOUNTER — Encounter: Payer: Self-pay | Admitting: Internal Medicine

## 2014-02-17 VITALS — BP 160/112 | HR 81 | Temp 98.1°F | Wt 163.0 lb

## 2014-02-17 DIAGNOSIS — R7309 Other abnormal glucose: Secondary | ICD-10-CM

## 2014-02-17 DIAGNOSIS — E785 Hyperlipidemia, unspecified: Secondary | ICD-10-CM

## 2014-02-17 DIAGNOSIS — I10 Essential (primary) hypertension: Secondary | ICD-10-CM

## 2014-02-17 DIAGNOSIS — F32A Depression, unspecified: Secondary | ICD-10-CM

## 2014-02-17 DIAGNOSIS — E039 Hypothyroidism, unspecified: Secondary | ICD-10-CM

## 2014-02-17 DIAGNOSIS — F3289 Other specified depressive episodes: Secondary | ICD-10-CM

## 2014-02-17 DIAGNOSIS — F329 Major depressive disorder, single episode, unspecified: Secondary | ICD-10-CM

## 2014-02-17 DIAGNOSIS — R7303 Prediabetes: Secondary | ICD-10-CM

## 2014-02-17 DIAGNOSIS — K219 Gastro-esophageal reflux disease without esophagitis: Secondary | ICD-10-CM

## 2014-02-17 DIAGNOSIS — G4700 Insomnia, unspecified: Secondary | ICD-10-CM

## 2014-02-17 MED ORDER — LISINOPRIL 20 MG PO TABS: 20.0000 mg | ORAL_TABLET | Freq: Every day | ORAL | Status: DC

## 2014-02-17 NOTE — Assessment & Plan Note (Signed)
Elevated today but she has been out of her blood pressure medication x 2 weeks Will refill today  Recent labs reviewed

## 2014-02-17 NOTE — Patient Instructions (Addendum)
Fat and Cholesterol Control Diet  Fat and cholesterol levels in your blood and organs are influenced by your diet. High levels of fat and cholesterol may lead to diseases of the heart, small and large blood vessels, gallbladder, liver, and pancreas.  CONTROLLING FAT AND CHOLESTEROL WITH DIET  Although exercise and lifestyle factors are important, your diet is key. That is because certain foods are known to raise cholesterol and others to lower it. The goal is to balance foods for their effect on cholesterol and more importantly, to replace saturated and trans fat with other types of fat, such as monounsaturated fat, polyunsaturated fat, and omega-3 fatty acids.  On average, a person should consume no more than 15 to 17 g of saturated fat daily. Saturated and trans fats are considered "bad" fats, and they will raise LDL cholesterol. Saturated fats are primarily found in animal products such as meats, butter, and cream. However, that does not mean you need to give up all your favorite foods. Today, there are good tasting, low-fat, low-cholesterol substitutes for most of the things you like to eat. Choose low-fat or nonfat alternatives. Choose round or loin cuts of red meat. These types of cuts are lowest in fat and cholesterol. Chicken (without the skin), fish, veal, and ground turkey breast are great choices. Eliminate fatty meats, such as hot dogs and salami. Even shellfish have little or no saturated fat. Have a 3 oz (85 g) portion when you eat lean meat, poultry, or fish.  Trans fats are also called "partially hydrogenated oils." They are oils that have been scientifically manipulated so that they are solid at room temperature resulting in a longer shelf life and improved taste and texture of foods in which they are added. Trans fats are found in stick margarine, some tub margarines, cookies, crackers, and baked goods.   When baking and cooking, oils are a great substitute for butter. The monounsaturated oils are  especially beneficial since it is believed they lower LDL and raise HDL. The oils you should avoid entirely are saturated tropical oils, such as coconut and palm.   Remember to eat a lot from food groups that are naturally free of saturated and trans fat, including fish, fruit, vegetables, beans, grains (barley, rice, couscous, bulgur wheat), and pasta (without cream sauces).   IDENTIFYING FOODS THAT LOWER FAT AND CHOLESTEROL   Soluble fiber may lower your cholesterol. This type of fiber is found in fruits such as apples, vegetables such as broccoli, potatoes, and carrots, legumes such as beans, peas, and lentils, and grains such as barley. Foods fortified with plant sterols (phytosterol) may also lower cholesterol. You should eat at least 2 g per day of these foods for a cholesterol lowering effect.   Read package labels to identify low-saturated fats, trans fat free, and low-fat foods at the supermarket. Select cheeses that have only 2 to 3 g saturated fat per ounce. Use a heart-healthy tub margarine that is free of trans fats or partially hydrogenated oil. When buying baked goods (cookies, crackers), avoid partially hydrogenated oils. Breads and muffins should be made from whole grains (whole-wheat or whole oat flour, instead of "flour" or "enriched flour"). Buy non-creamy canned soups with reduced salt and no added fats.   FOOD PREPARATION TECHNIQUES   Never deep-fry. If you must fry, either stir-fry, which uses very little fat, or use non-stick cooking sprays. When possible, broil, bake, or roast meats, and steam vegetables. Instead of putting butter or margarine on vegetables, use lemon   and herbs, applesauce, and cinnamon (for squash and sweet potatoes). Use nonfat yogurt, salsa, and low-fat dressings for salads.   LOW-SATURATED FAT / LOW-FAT FOOD SUBSTITUTES  Meats / Saturated Fat (g)  · Avoid: Steak, marbled (3 oz/85 g) / 11 g  · Choose: Steak, lean (3 oz/85 g) / 4 g  · Avoid: Hamburger (3 oz/85 g) / 7  g  · Choose: Hamburger, lean (3 oz/85 g) / 5 g  · Avoid: Ham (3 oz/85 g) / 6 g  · Choose: Ham, lean cut (3 oz/85 g) / 2.4 g  · Avoid: Chicken, with skin, dark meat (3 oz/85 g) / 4 g  · Choose: Chicken, skin removed, dark meat (3 oz/85 g) / 2 g  · Avoid: Chicken, with skin, light meat (3 oz/85 g) / 2.5 g  · Choose: Chicken, skin removed, light meat (3 oz/85 g) / 1 g  Dairy / Saturated Fat (g)  · Avoid: Whole milk (1 cup) / 5 g  · Choose: Low-fat milk, 2% (1 cup) / 3 g  · Choose: Low-fat milk, 1% (1 cup) / 1.5 g  · Choose: Skim milk (1 cup) / 0.3 g  · Avoid: Hard cheese (1 oz/28 g) / 6 g  · Choose: Skim milk cheese (1 oz/28 g) / 2 to 3 g  · Avoid: Cottage cheese, 4% fat (1 cup) / 6.5 g  · Choose: Low-fat cottage cheese, 1% fat (1 cup) / 1.5 g  · Avoid: Ice cream (1 cup) / 9 g  · Choose: Sherbet (1 cup) / 2.5 g  · Choose: Nonfat frozen yogurt (1 cup) / 0.3 g  · Choose: Frozen fruit bar / trace  · Avoid: Whipped cream (1 tbs) / 3.5 g  · Choose: Nondairy whipped topping (1 tbs) / 1 g  Condiments / Saturated Fat (g)  · Avoid: Mayonnaise (1 tbs) / 2 g  · Choose: Low-fat mayonnaise (1 tbs) / 1 g  · Avoid: Butter (1 tbs) / 7 g  · Choose: Extra light margarine (1 tbs) / 1 g  · Avoid: Coconut oil (1 tbs) / 11.8 g  · Choose: Olive oil (1 tbs) / 1.8 g  · Choose: Corn oil (1 tbs) / 1.7 g  · Choose: Safflower oil (1 tbs) / 1.2 g  · Choose: Sunflower oil (1 tbs) / 1.4 g  · Choose: Soybean oil (1 tbs) / 2.4 g  · Choose: Canola oil (1 tbs) / 1 g  Document Released: 07/22/2005 Document Revised: 11/16/2012 Document Reviewed: 01/10/2011  Exit[employer]® Patient Information ©2015 [employer]. This information is not intended to replace advice given to you by your health care provider. Make sure you discuss any questions you have with your health care provider.

## 2014-02-17 NOTE — Assessment & Plan Note (Signed)
Well controlled on restoril Will continue for now

## 2014-02-17 NOTE — Assessment & Plan Note (Signed)
Situational- lost her husband 3 years ago Not medicated  Will continue to monitor

## 2014-02-17 NOTE — Progress Notes (Signed)
HPI  Pt presents to the clinic today to establish care. [facility]  Her blood pressure is elevated today. She reports that she has been out of her medications for the last 2 weeks.   She reports that she continues to have abdominal pain for since 08/2013. It is not reflux.Her GI doctor performen EGD which showed that that her stomach was inflamed. They put her on omeprazole. She reports no improvement since that time. She also reports intermittent constipation and diarrhea. She does have mucous that leaks from her rectum. She has noticed it for about the last 2 months. She denies any anal itching. She denies blood in her stool.  Additionally, she reports that she was in the hospital being evaluated for a fall. They told her that at some point she has had a mini stroke. She reports 2 other episodes that have occurred where she believes that she had mini strokes but did not go to the ER for evaluation because her symptoms had resolved quickly.   Past Medical History  Diagnosis Date  . Hyperlipidemia   . GERD (gastroesophageal reflux disease)   . Arthritis   . Depression   . Vitamin D deficiency   . Prediabetes   . Occipital neuralgia   . Hypertension     Normal cardiolite 05/2006 EF 71%    Current Outpatient Prescriptions  Medication Sig Dispense Refill  . aspirin 325 MG tablet Take 325 mg by mouth daily.      . cholecalciferol (VITAMIN D) 1000 UNITS tablet Take 1,000 Units by mouth daily.      Marland Kitchen. levothyroxine (SYNTHROID, LEVOTHROID) 100 MCG tablet Take 100 mcg by mouth daily before breakfast.      . lisinopril (PRINIVIL,ZESTRIL) 20 MG tablet Take 1 tablet (20 mg total) by mouth daily. Take 1 tablet daily for BP  90 tablet  1  . MAGNESIUM OXIDE PO Take 1 capsule by mouth daily.      Marland Kitchen. omeprazole (PRILOSEC) 40 MG capsule Take 1 capsule (40 mg total) by mouth daily.  90 capsule  1  . rosuvastatin (CRESTOR) 10 MG tablet Take 10 mg by mouth daily.       . temazepam (RESTORIL) 30 MG capsule Take 1 capsule (30 mg total) by mouth at bedtime as needed for sleep.  30 capsule  3  . polyethylene glycol (MIRALAX / GLYCOLAX) packet Take 17 g by mouth daily.  14 each  0   No current facility-administered medications for this visit.    No Known Allergies  Family History  Problem Relation Age of Onset  . Heart disease Mother   . Hypertension Mother   . Diabetes Mother   . Heart disease Father   . Cancer Maternal Aunt     breast    History   Social History  . Marital Status: Widowed    Spouse Name: N/A    Number of Children: N/A  . Years of Education: N/A   Occupational History  . Not on file.   Social History Main Topics  . Smoking status: Never Smoker   . Smokeless tobacco: Never Used  . Alcohol Use: No  . Drug Use: No  . Sexual Activity: Not on file   Other Topics Concern  . Not on file   Social History Narrative  . No narrative on file    ROS:  Constitutional: Denies fever, malaise, fatigue, headache or abrupt weight changes.  Respiratory: Denies difficulty breathing, shortness of  breath, cough or sputum production.   Cardiovascular: Denies chest pain, chest tightness, palpitations or swelling in the hands or feet.  Gastrointestinal: Pt reports epigastric pain. Denies bloating, or blood in the stool.  GU: Denies frequency, urgency, pain with urination, blood in urine, odor or discharge. Musculoskeletal: Pt reports arthritis in neck, back, hands and knees. Denies decrease in range of motion, difficulty with gait, muscle pain.  Skin: Denies redness, rashes, lesions or ulcercations.  Neurological: Denies dizziness, difficulty with memory, difficulty with speech or problems with balance and coordination.  Psych: Pt reports intermittent depression. Denies anxiet, SI/HI.  No other specific complaints in a complete review of systems (except as listed in HPI above).  PE:  BP 160/112  Pulse 81  Temp(Src) 98.1 F (36.7  C) (Oral)  Wt 163 lb (73.936 kg)  SpO2 98% Wt Readings from Last 3 Encounters:  02/17/14 163 lb (73.936 kg)  01/10/14 150 lb (68.04 kg)  01/10/14 165 lb 6.4 oz (75.025 kg)    General: Appears her stated age, well developed, well nourished in NAD. Cardiovascular: Normal rate and rhythm. S1,S2 noted.  No murmur, rubs or gallops noted. No JVD or BLE edema. No carotid bruits noted. Pulmonary/Chest: Normal effort and positive vesicular breath sounds. No respiratory distress. No wheezes, rales or ronchi noted.  Abdomen: Soft and nontender. Normal bowel sounds, no bruits noted. No distention or masses noted. Liver, spleen and kidneys non palpable. Musculoskeletal: Normal range of motion. No signs of joint swelling. No difficulty with gait.  Neurological: Alert and oriented. Cranial nerves II-XII intact. Coordination normal. +DTRs bilaterally. Psychiatric: Mood tearful and affect normal. Behavior is normal. Judgment and thought content normal.     BMET    Component Value Date/Time   NA 138 01/10/2014 2232   K 3.8 01/10/2014 2232   CL 99 01/10/2014 2232   CO2 21 01/10/2014 2232   GLUCOSE 99 01/10/2014 2232   BUN 21 01/10/2014 2232   CREATININE 0.89 01/10/2014 2232   CREATININE 1.13* 11/10/2013 1215   CALCIUM 9.8 01/10/2014 2232   GFRNONAA 65* 01/10/2014 2232   GFRNONAA 50* 11/10/2013 1215   GFRAA 75* 01/10/2014 2232   GFRAA 57* 11/10/2013 1215    Lipid Panel     Component Value Date/Time   CHOL 208* 11/10/2013 1215   TRIG 149 11/10/2013 1215   HDL 50 11/10/2013 1215   CHOLHDL 4.2 11/10/2013 1215   VLDL 30 11/10/2013 1215   LDLCALC 128* 11/10/2013 1215    CBC    Component Value Date/Time   WBC 6.7 01/10/2014 2232   RBC 4.06 01/10/2014 2232   RBC 3.55* 04/27/2008 0510   HGB 12.4 01/10/2014 2232   HCT 35.6* 01/10/2014 2232   PLT 174 01/10/2014 2232   MCV 87.7 01/10/2014 2232   MCH 30.5 01/10/2014 2232   MCHC 34.8 01/10/2014 2232   RDW 13.0 01/10/2014 2232   LYMPHSABS 1.1 01/10/2014 1238   MONOABS 0.3 01/10/2014 1238   EOSABS  0.2 01/10/2014 1238   BASOSABS 0.1 01/10/2014 1238    Hgb A1C Lab Results  Component Value Date   HGBA1C 5.3 08/19/2013     Assessment and Plan:   RTC in 3 months for medicare wellness

## 2014-02-17 NOTE — Assessment & Plan Note (Signed)
Last A1C reviewed

## 2014-02-17 NOTE — Progress Notes (Signed)
Pre visit review using our clinic review tool, if applicable. No additional management support is needed unless otherwise documented below in the visit note. 

## 2014-02-17 NOTE — Assessment & Plan Note (Signed)
Reflux improved but still experiencing epigastric pain despite omeprazole Will try to fine GI notes in the system and review Continue prilosec at this time RTC if pain worsens or persist

## 2014-02-17 NOTE — Assessment & Plan Note (Signed)
Last TSH reviewed Continue current synthroid dose for now

## 2014-02-17 NOTE — Assessment & Plan Note (Signed)
Last lipid profile reviewed Continue crestor for now

## 2014-02-18 ENCOUNTER — Telehealth: Payer: Self-pay | Admitting: Internal Medicine

## 2014-02-18 NOTE — Telephone Encounter (Signed)
Relevant patient education assigned to patient using Emmi. ° °

## 2014-03-15 ENCOUNTER — Encounter: Payer: Self-pay | Admitting: Emergency Medicine

## 2014-03-22 ENCOUNTER — Other Ambulatory Visit: Payer: Self-pay | Admitting: Internal Medicine

## 2014-03-22 DIAGNOSIS — Z0000 Encounter for general adult medical examination without abnormal findings: Secondary | ICD-10-CM

## 2014-03-29 ENCOUNTER — Other Ambulatory Visit (INDEPENDENT_AMBULATORY_CARE_PROVIDER_SITE_OTHER): Payer: Medicare Other

## 2014-03-29 DIAGNOSIS — I10 Essential (primary) hypertension: Secondary | ICD-10-CM

## 2014-03-29 DIAGNOSIS — E785 Hyperlipidemia, unspecified: Secondary | ICD-10-CM

## 2014-03-29 DIAGNOSIS — Z0000 Encounter for general adult medical examination without abnormal findings: Secondary | ICD-10-CM

## 2014-03-29 LAB — CBC
HCT: 39.8 % (ref 36.0–46.0)
Hemoglobin: 13.4 g/dL (ref 12.0–15.0)
MCHC: 33.6 g/dL (ref 30.0–36.0)
MCV: 92.4 fl (ref 78.0–100.0)
Platelets: 160 10*3/uL (ref 150.0–400.0)
RBC: 4.3 Mil/uL (ref 3.87–5.11)
RDW: 13.4 % (ref 11.5–15.5)
WBC: 4.6 10*3/uL (ref 4.0–10.5)

## 2014-03-29 LAB — LIPID PANEL
Cholesterol: 244 mg/dL — ABNORMAL HIGH (ref 0–200)
HDL: 51.8 mg/dL (ref >39.00)
LDL Cholesterol: 154 mg/dL — ABNORMAL HIGH (ref 0–99)
NonHDL: 192.2
Total CHOL/HDL Ratio: 5
Triglycerides: 190 mg/dL — ABNORMAL HIGH (ref 0.0–149.0)
VLDL: 38 mg/dL (ref 0.0–40.0)

## 2014-03-29 LAB — COMPREHENSIVE METABOLIC PANEL
ALT: 20 U/L (ref 0–35)
AST: 23 U/L (ref 0–37)
Albumin: 4 g/dL (ref 3.5–5.2)
Alkaline Phosphatase: 90 U/L (ref 39–117)
BUN: 22 mg/dL (ref 6–23)
CO2: 27 mEq/L (ref 19–32)
Calcium: 9.6 mg/dL (ref 8.4–10.5)
Chloride: 102 mEq/L (ref 96–112)
Creatinine, Ser: 0.9 mg/dL (ref 0.4–1.2)
GFR: 68.53 mL/min (ref >60.00)
Glucose, Bld: 92 mg/dL (ref 70–99)
Potassium: 4.5 mEq/L (ref 3.5–5.1)
Sodium: 138 mEq/L (ref 135–145)
Total Bilirubin: 0.6 mg/dL (ref 0.2–1.2)
Total Protein: 7.5 g/dL (ref 6.0–8.3)

## 2014-03-29 LAB — TSH: TSH: 8.56 u[IU]/mL — ABNORMAL HIGH (ref 0.35–4.50)

## 2014-04-05 ENCOUNTER — Ambulatory Visit (INDEPENDENT_AMBULATORY_CARE_PROVIDER_SITE_OTHER): Payer: Medicare Other | Admitting: Internal Medicine

## 2014-04-05 ENCOUNTER — Encounter: Payer: Self-pay | Admitting: Internal Medicine

## 2014-04-05 VITALS — BP 136/84 | HR 74 | Temp 97.6°F | Ht 63.25 in | Wt 164.0 lb

## 2014-04-05 DIAGNOSIS — Z9181 History of falling: Secondary | ICD-10-CM

## 2014-04-05 DIAGNOSIS — M25562 Pain in left knee: Secondary | ICD-10-CM

## 2014-04-05 DIAGNOSIS — M25569 Pain in unspecified knee: Secondary | ICD-10-CM

## 2014-04-05 DIAGNOSIS — R296 Repeated falls: Secondary | ICD-10-CM

## 2014-04-05 DIAGNOSIS — E039 Hypothyroidism, unspecified: Secondary | ICD-10-CM

## 2014-04-05 DIAGNOSIS — Z0000 Encounter for general adult medical examination without abnormal findings: Secondary | ICD-10-CM

## 2014-04-05 DIAGNOSIS — E785 Hyperlipidemia, unspecified: Secondary | ICD-10-CM

## 2014-04-05 MED ORDER — MELOXICAM 15 MG PO TABS: 15.0000 mg | ORAL_TABLET | Freq: Every day | ORAL | Status: DC

## 2014-04-05 MED ORDER — ROSUVASTATIN CALCIUM 20 MG PO TABS: 20.0000 mg | ORAL_TABLET | Freq: Every day | ORAL | Status: DC

## 2014-04-05 MED ORDER — LEVOTHYROXINE SODIUM 112 MCG PO TABS: 112.0000 ug | ORAL_TABLET | Freq: Every day | ORAL | Status: DC

## 2014-04-05 NOTE — Patient Instructions (Addendum)

## 2014-04-05 NOTE — Assessment & Plan Note (Signed)
Elevated on 10 mg Crestor Will increase to 20 mg daily Work on a low fat, low cholesterol diet

## 2014-04-05 NOTE — Progress Notes (Signed)
Pre visit review using our clinic review tool, if applicable. No additional management support is needed unless otherwise documented below in the visit note. 

## 2014-04-05 NOTE — Assessment & Plan Note (Signed)
Elevated TSH Will increase synthroid to 112 mcg daily  RTC in 1 month to recheck TSH

## 2014-04-05 NOTE — Progress Notes (Signed)
HPI:  Pt presents to the clinic today with c/o her medicare wellness exam. She did have her labs drawn last week. TSH was elevated at 8. She is taking synthroid 100 mcg daily. Her cholesterol was also elevated despite taking 10 mg Crestor daily. She reports that she has had 4 recent falls over the last 2 months. Sometimes she trips over things, other times her left knee gives out on her and she falls. She has not had any specific injury with the falls though.  Past Medical History  Diagnosis Date  . Hyperlipidemia   . GERD (gastroesophageal reflux disease)   . Arthritis   . Depression   . Vitamin D deficiency   . Prediabetes   . Occipital neuralgia   . Hypertension     Normal cardiolite 05/2006 EF 71%    Current Outpatient Prescriptions  Medication Sig Dispense Refill  . aspirin 325 MG tablet Take 325 mg by mouth daily.      . cholecalciferol (VITAMIN D) 1000 UNITS tablet Take 1,000 Units by mouth daily.      Marland Kitchen levothyroxine (SYNTHROID, LEVOTHROID) 100 MCG tablet Take 100 mcg by mouth daily before breakfast.      . lisinopril (PRINIVIL,ZESTRIL) 20 MG tablet Take 1 tablet (20 mg total) by mouth daily. Take 1 tablet daily for BP  90 tablet  1  . MAGNESIUM OXIDE PO Take 1 capsule by mouth daily.      Marland Kitchen omeprazole (PRILOSEC) 40 MG capsule Take 1 capsule (40 mg total) by mouth daily.  90 capsule  1  . polyethylene glycol (MIRALAX / GLYCOLAX) packet Take 17 g by mouth daily.  14 each  0  . rosuvastatin (CRESTOR) 10 MG tablet Take 10 mg by mouth daily.      . temazepam (RESTORIL) 30 MG capsule Take 1 capsule (30 mg total) by mouth at bedtime as needed for sleep.  30 capsule  3   No current facility-administered medications for this visit.    No Known Allergies  Family History  Problem Relation Age of Onset  . Heart disease Mother   . Hypertension Mother   . Diabetes Mother   . Heart disease Father   . Cancer Maternal Aunt     breast  . Stroke Neg Hx     History   Social  History  . Marital Status: Widowed    Spouse Name: N/A    Number of Children: N/A  . Years of Education: N/A   Occupational History  . Not on file.   Social History Main Topics  . Smoking status: Never Smoker   . Smokeless tobacco: Never Used  . Alcohol Use: No  . Drug Use: No  . Sexual Activity: No   Other Topics Concern  . Not on file   Social History Narrative  . No narrative on file    Hospitiliaztions: None  Health Maintenance:    Flu: 05/2013  Tetanus: 2007  Pneumovax: 2012  Zostavax: 2007  Pap Smear: 2011  Mammorgram: 03/2014  Bone Density: 2014  Colonoscopy: 2012  Eye Doctor: 2014  Dental Exam: no need- dentures  PCP: Nicki Reaper, NP-C GI: Dr. Coralie Carpen [facility]o- [facility]  I have personally reviewed and have noted:  1. The patient's medical and social history 2. Their use of alcohol, tobacco or illicit drugs 3. Their current medications and supplements 4. The patient's functional ability including ADL's, fall risks,  home safety risks and hearing or visual impairment. 5. Diet and physical  activities 6. Evidence for depression or mood disorder  Subjective:   Review of Systems:   Constitutional: Denies fever, malaise, fatigue, headache or abrupt weight changes.  HEENT: Denies eye pain, eye redness, ear pain, ringing in the ears, wax buildup, runny nose, nasal congestion, bloody nose, or sore throat. Respiratory: Denies difficulty breathing, shortness of breath, cough or sputum production.   Cardiovascular: Denies chest pain, chest tightness, palpitations or swelling in the hands or feet.  Gastrointestinal: Denies abdominal pain, bloating, constipation, diarrhea or blood in the stool.  GU: Denies urgency, frequency, pain with urination, burning sensation, blood in urine, odor or discharge. Musculoskeletal: Pt reports left knee pain. Denies decrease in range of motion, difficulty with gait, muscle pain.  Skin: Denies redness, rashes, lesions  or ulcercations.  Neurological: Denies dizziness, difficulty with memory, difficulty with speech or problems with balance and coordination.   No other specific complaints in a complete review of systems (except as listed in HPI above).  Objective:  PE:   BP 136/84  Pulse 74  Temp(Src) 97.6 F (36.4 C) (Oral)  Ht 5' 3.25" (1.607 m)  Wt 164 lb (74.39 kg)  BMI 28.81 kg/m2  SpO2 98% Wt Readings from Last 3 Encounters:  04/05/14 164 lb (74.39 kg)  02/17/14 163 lb (73.936 kg)  01/10/14 150 lb (68.04 kg)    General: Appears her stated age, well developed, well nourished in NAD. Cardiovascular: Normal rate and rhythm. S1,S2 noted.  No murmur, rubs or gallops noted. No JVD or BLE edema. No carotid bruits noted. Pulmonary/Chest: Normal effort and positive vesicular breath sounds. No respiratory distress. No wheezes, rales or ronchi noted.  Abdomen: Soft and nontender. Normal bowel sounds, no bruits noted. No distention or masses noted. Liver, spleen and kidneys non palpable. Musculoskeletal: Some mild swelling noted of left knee. No bruising noted. Normal ROM.  Neurological: Alert and oriented. Cranial nerves II-XII grossly intact.  Psychiatric: Mood and affect normal. Behavior is normal. Judgment and thought content normal.     BMET    Component Value Date/Time   NA 138 03/29/2014 0934   K 4.5 03/29/2014 0934   CL 102 03/29/2014 0934   CO2 27 03/29/2014 0934   GLUCOSE 92 03/29/2014 0934   BUN 22 03/29/2014 0934   CREATININE 0.9 03/29/2014 0934   CREATININE 1.13* 11/10/2013 1215   CALCIUM 9.6 03/29/2014 0934   GFRNONAA 65* 01/10/2014 2232   GFRNONAA 50* 11/10/2013 1215   GFRAA 75* 01/10/2014 2232   GFRAA 57* 11/10/2013 1215    Lipid Panel     Component Value Date/Time   CHOL 244* 03/29/2014 0934   TRIG 190.0* 03/29/2014 0934   HDL 51.80 03/29/2014 0934   CHOLHDL 5 03/29/2014 0934   VLDL 38.0 03/29/2014 0934   LDLCALC 154* 03/29/2014 0934    CBC    Component Value Date/Time   WBC 4.6  03/29/2014 0934   RBC 4.30 03/29/2014 0934   RBC 3.55* 04/27/2008 0510   HGB 13.4 03/29/2014 0934   HCT 39.8 03/29/2014 0934   PLT 160.0 03/29/2014 0934   MCV 92.4 03/29/2014 0934   MCH 30.5 01/10/2014 2232   MCHC 33.6 03/29/2014 0934   RDW 13.4 03/29/2014 0934   LYMPHSABS 1.1 01/10/2014 1238   MONOABS 0.3 01/10/2014 1238   EOSABS 0.2 01/10/2014 1238   BASOSABS 0.1 01/10/2014 1238    Hgb A1C Lab Results  Component Value Date   HGBA1C 5.3 08/19/2013      Assessment and Plan:   Medicare  Annual Wellness Visit:  Diet: Heart healthy Physical activity: Walks every other day Depression/mood screen: Negative- a little down d/t anniversary of husbands death. Hearing: Intact to whispered voice Visual acuity: Grossly normal, performs annual eye exam  ADLs: Capable Fall risk: High falls risk, fell 4 times in the last 2 months. Home safety: Good Cognitive evaluation: Intact to orientation, naming, recall and repetition EOL planning: Adv directives, full code/ I agree  Preventative Medicine:  Flu shot today She no longer prefers to screen with pap smear  Left Knee pain s/p fall:  eRx for Mobic 15 mg daily  Next appointment: 6 months

## 2014-04-18 ENCOUNTER — Encounter: Payer: Self-pay | Admitting: Emergency Medicine

## 2014-04-19 ENCOUNTER — Encounter: Payer: Self-pay | Admitting: Emergency Medicine

## 2014-05-04 ENCOUNTER — Other Ambulatory Visit: Payer: Self-pay | Admitting: Internal Medicine

## 2014-05-06 ENCOUNTER — Other Ambulatory Visit: Payer: Self-pay

## 2014-05-06 NOTE — Telephone Encounter (Signed)
Ok to phone in restoril 

## 2014-05-06 NOTE — Telephone Encounter (Signed)
Refill request already in pts chart from [facility].

## 2014-05-06 NOTE — Telephone Encounter (Signed)
Pt left note requesting refill temazepam to [facility] pharmacy; pt only has 2 caps left.Please advise.

## 2014-05-06 NOTE — Telephone Encounter (Signed)
Rx called in to pharmacy. 

## 2014-06-02 ENCOUNTER — Other Ambulatory Visit: Payer: Self-pay | Admitting: Internal Medicine

## 2014-06-03 NOTE — Telephone Encounter (Signed)
Rx called in to pharmacy. 

## 2014-06-03 NOTE — Telephone Encounter (Signed)
Last filled 05/06/2014--please advise if okay to fill on 06/06/2014

## 2014-06-03 NOTE — Telephone Encounter (Signed)
Ok to phone in temazepam to be filled on or after 11/2

## 2014-07-04 ENCOUNTER — Other Ambulatory Visit: Payer: Self-pay | Admitting: Internal Medicine

## 2014-07-06 ENCOUNTER — Other Ambulatory Visit: Payer: Self-pay | Admitting: Internal Medicine

## 2014-07-06 NOTE — Telephone Encounter (Signed)
Last filled 06/03/2014--please advise 

## 2014-07-06 NOTE — Telephone Encounter (Signed)
Rx called in to pharmacy. 

## 2014-07-07 ENCOUNTER — Ambulatory Visit (INDEPENDENT_AMBULATORY_CARE_PROVIDER_SITE_OTHER): Payer: Medicare Other | Admitting: Physician Assistant

## 2014-07-07 ENCOUNTER — Encounter: Payer: Self-pay | Admitting: Physician Assistant

## 2014-07-07 VITALS — BP 122/82 | HR 76 | Temp 97.7°F | Resp 16 | Ht 64.0 in | Wt 171.0 lb

## 2014-07-07 DIAGNOSIS — G4700 Insomnia, unspecified: Secondary | ICD-10-CM

## 2014-07-07 DIAGNOSIS — R300 Dysuria: Secondary | ICD-10-CM

## 2014-07-07 DIAGNOSIS — F32A Depression, unspecified: Secondary | ICD-10-CM

## 2014-07-07 DIAGNOSIS — M1990 Unspecified osteoarthritis, unspecified site: Secondary | ICD-10-CM

## 2014-07-07 DIAGNOSIS — J309 Allergic rhinitis, unspecified: Secondary | ICD-10-CM

## 2014-07-07 DIAGNOSIS — F329 Major depressive disorder, single episode, unspecified: Secondary | ICD-10-CM

## 2014-07-07 MED ORDER — CELECOXIB 100 MG PO CAPS: 100.0000 mg | ORAL_CAPSULE | Freq: Every day | ORAL | Status: DC

## 2014-07-07 MED ORDER — SULFAMETHOXAZOLE-TRIMETHOPRIM 800-160 MG PO TABS: 1.0000 | ORAL_TABLET | Freq: Two times a day (BID) | ORAL | Status: DC

## 2014-07-07 NOTE — Progress Notes (Signed)
Subjective:    Patient ID: Kimberly Cook, female    DOB: 08-02-1946, 68 y.o.   MRN: [id number]  Dysuria  This is a new problem. Episode onset: Patient states she cannot remember when symptoms started.  She states she had same problem in June 2015. The problem occurs every urination. The problem has been unchanged. The quality of the pain is described as burning. Maximum temperature: did not check at home. She is not sexually active. Associated symptoms include frequency and urgency. Pertinent negatives include no chills. She has tried nothing for the symptoms. The treatment provided no relief. Her past medical history is significant for recurrent UTIs. Patient states she had "bladder tacked up".  Patient was treated for UTI in June 2015.  Urine culture in June 2015 came back positive for enterobacter cloacae and was treated with Bactrim for 14 days based on the sensitivity report.  Patient states she went to another provider sue to it being closer to home; however, had a horrible experience and wanted to return to Dr. Kathryne Sharper office due to the excellent care we provide.  Patient is switching to [facility].  She states that the medications will be cheaper if we send them to [facility] mail order especially for 3 month supplies.  Patient states her husband passed 3 years ago and she is still coping with his passing.  She states that finances have been difficult due to only having one income and the same amount of bills.  Patient states she is coping well, but still gets emotional from time to time.  She states her one son came to live with her when her husband passed.  She states that the holiday seasons are the hardest due to her husband loving this time of year.  She does admit to some loneliness and wanting her sons to come around more.  However, she does realize that her sons are married and that one is a IT trainer and only comes home on the weekends.  She states she wants him to spend time with  his wife.  Her other son lives 20 miles away.  Patient does get phone calls regularly from daughter-in law that lives next door.  Patient states she also spends time with her daughter-in-law's parents due to one of her daughter-in-law's parents (mother) having dementia.  Patient goes over to their house to watch the mother so that her daughter-in-law's father can run errands.    Patient is currently taking Temazepam 30mg - 1 capsule at bedtime for sleep.  She states she lays down in bed around 8:30pm and watches TV and takes this medication as prescribed and then turns off TV and falls asleep.   Has trouble sleeping and switching insurance to [employer].  [employer] does mail in medications.    Patient was put on Mobic for arthritis and states it is not helping right now and would like to take Celebrex again. Patient was on Celebrex 100mg  daily.  Patient states she needs a one month supply for right now, but will then need 3 month supply sent into [facility] mail order in January 2016.  GFR= 68.53 on 03/29/14 Review of Systems  Constitutional: Positive for fatigue. Negative for fever, chills and diaphoresis.  HENT: Positive for postnasal drip, sinus pressure and sore throat. Negative for ear discharge, ear pain, rhinorrhea and trouble swallowing.        Scratchy voice.    Eyes: Negative.   Respiratory: Positive for cough. Negative for chest tightness, shortness of breath and  wheezing.   Cardiovascular: Negative.   Gastrointestinal: Negative.   Genitourinary: Positive for dysuria, urgency and frequency. Negative for vaginal bleeding and vaginal discharge.  Skin: Negative.   Neurological: Positive for headaches. Negative for dizziness and light-headedness.  Psychiatric/Behavioral:       Has down times sometimes   Past Medical History  Diagnosis Date  . Hyperlipidemia   . GERD (gastroesophageal reflux disease)   . Arthritis   . Depression   . Vitamin D deficiency   . Prediabetes   . Occipital neuralgia    . Hypertension     Normal cardiolite 05/2006 EF 71%   Current Outpatient Prescriptions on File Prior to Visit  Medication Sig Dispense Refill  . aspirin 325 MG tablet Take 325 mg by mouth daily.    . cholecalciferol (VITAMIN D) 1000 UNITS tablet Take 1,000 Units by mouth daily.    Marland Kitchen. levothyroxine (SYNTHROID, LEVOTHROID) 112 MCG tablet Take 1 tablet (112 mcg total) by mouth daily. 90 tablet 3  . lisinopril (PRINIVIL,ZESTRIL) 20 MG tablet Take 1 tablet (20 mg total) by mouth daily. Take 1 tablet daily for BP 90 tablet 1  . MAGNESIUM OXIDE PO Take 1 capsule by mouth daily.    . meloxicam (MOBIC) 15 MG tablet Take 1 tablet (15 mg total) by mouth daily. 30 tablet 0  . omeprazole (PRILOSEC) 40 MG capsule Take 1 capsule (40 mg total) by mouth daily. 90 capsule 1  . polyethylene glycol (MIRALAX / GLYCOLAX) packet Take 17 g by mouth daily. 14 each 0  . rosuvastatin (CRESTOR) 20 MG tablet Take 1 tablet (20 mg total) by mouth daily. 30 tablet 2  . temazepam (RESTORIL) 30 MG capsule TAKE ONE CAPSULE BY MOUTH AT BEDTIME AS NEEDED FOR SLEEP 30 capsule 0   No current facility-administered medications on file prior to visit.   No Known Allergies    BP 122/82 mmHg  Pulse 76  Temp(Src) 97.7 F (36.5 C)  Resp 16  Ht 5\' 4"  (1.626 m)  Wt 171 lb (77.565 kg)  BMI 29.34 kg/m2 Wt Readings from Last 3 Encounters:  07/07/14 171 lb (77.565 kg)  04/05/14 164 lb (74.39 kg)  02/17/14 163 lb (73.936 kg)   Objective:   Physical Exam  Constitutional: She is oriented to person, place, and time. She appears well-developed and well-nourished. She does not have a sickly appearance. No distress.  HENT:  Head: Normocephalic.  Eyes: Conjunctivae and lids are normal. Right eye exhibits no discharge. Left eye exhibits no discharge. No scleral icterus.  Neck: Phonation normal.  Cardiovascular: Normal rate, regular rhythm, S1 normal, S2 normal, normal heart sounds and normal pulses.  Exam reveals no gallop, no distant  heart sounds and no friction rub.   No murmur heard. Pulmonary/Chest: Effort normal and breath sounds normal. No respiratory distress. She has no decreased breath sounds. She has no wheezes. She has no rhonchi. She has no rales. She exhibits no tenderness.  Abdominal: Soft. Bowel sounds are normal. She exhibits no distension and no mass. There is no hepatosplenomegaly. There is no tenderness. There is no rebound, no guarding and no CVA tenderness.  Neurological: She is alert and oriented to person, place, and time. Gait normal.  Skin: Skin is warm, dry and intact. No rash noted. She is not diaphoretic.  Psychiatric: She has a normal mood and affect. Her speech is normal and behavior is normal. Judgment and thought content normal. Cognition and memory are normal.  Vitals reviewed.  Assessment & Plan:  1. Insomnia Continue Temazepam 30mg - 1 capsule at bedtime.  Patient will need 3 month supply in January 2016 sent to [facility] mail order.  2. Depression Patient does not want to be on any medication right now.  She states she is learning to cope with husband's passing and loneliness.  Told patient to talk with sons more on the phone and to tell them how she feels to help get the support she needs.   3. Dysuria- Ordered labs to R/O UTI - Take Bactrim as prescribed- sulfamethoxazole-trimethoprim (BACTRIM DS,SEPTRA DS) 800-160 MG per tablet; Take 1 tablet by mouth 2 (two) times daily. For 7 days until sensitivity is back.  Dispense: 14 tablet; Refill: 0 - Urine culture - Urinalysis, Routine w reflex microscopic  4. Arthritis -Start taking Celebrex as prescribed- celecoxib (CELEBREX) 100 MG capsule; Take 1 capsule (100 mg total) by mouth daily.  Dispense: 30 capsule; Refill: 0 Patient will need 3 month supply in January 2016 sent to [facility]. -Stop taking Mobic  5. Allergic Rhinitis -Will tell patient tomorrow (07/08/14) to try allergy pill OTC daily. Please pick one of the over the counter  allergy medications below and take it once daily for allergies.  Claritin or loratadine cheapest but likely the weakest  Zyrtec or certizine at night because it can make you sleepy The strongest is allegra or fexafinadine  Cheapest at [facility], [employer], [employer]  I will call you with the lab results. Discussed medication effects and SE's.  Pt agreed to treatment plan. Please keep your follow up appt on 09/08/14.  Haidar Muse, Lise Auer, PA-C 11:48 AM [facility]

## 2014-07-07 NOTE — Patient Instructions (Addendum)
Start taking the Bactrim with food for urinary tract infection. Start taking the Celebrex 100mg  - 1 tablet daily for arthritis. Stop taking Mobic.  I will call you with results of urinalysis and urine culture. Make sure you are drinking plenty of water to stay hydrated.  Please keep your follow up appointment on 09/08/14.  Urinary Tract Infection Urinary tract infections (UTIs) can develop anywhere along your urinary tract. Your urinary tract is your body's drainage system for removing wastes and extra water. Your urinary tract includes two kidneys, two ureters, a bladder, and a urethra. Your kidneys are a pair of bean-shaped organs. Each kidney is about the size of your fist. They are located below your ribs, one on each side of your spine. CAUSES Infections are caused by microbes, which are microscopic organisms, including fungi, viruses, and bacteria. These organisms are so small that they can only be seen through a microscope. Bacteria are the microbes that most commonly cause UTIs. SYMPTOMS  Symptoms of UTIs may vary by age and gender of the patient and by the location of the infection. Symptoms in young women typically include a frequent and intense urge to urinate and a painful, burning feeling in the bladder or urethra during urination. Older women and men are more likely to be tired, shaky, and weak and have muscle aches and abdominal pain. A fever may mean the infection is in your kidneys. Other symptoms of a kidney infection include pain in your back or sides below the ribs, nausea, and vomiting. DIAGNOSIS To diagnose a UTI, your caregiver will ask you about your symptoms. Your caregiver also will ask to provide a urine sample. The urine sample will be tested for bacteria and white blood cells. White blood cells are made by your body to help fight infection. TREATMENT  Typically, UTIs can be treated with medication. Because most UTIs are caused by a bacterial infection, they usually can be  treated with the use of antibiotics. The choice of antibiotic and length of treatment depend on your symptoms and the type of bacteria causing your infection. HOME CARE INSTRUCTIONS  If you were prescribed antibiotics, take them exactly as your caregiver instructs you. Finish the medication even if you feel better after you have only taken some of the medication.  Drink enough water and fluids to keep your urine clear or pale yellow.  Avoid caffeine, tea, and carbonated beverages. They tend to irritate your bladder.  Empty your bladder often. Avoid holding urine for long periods of time.  Empty your bladder before and after sexual intercourse.  After a bowel movement, women should cleanse from front to back. Use each tissue only once. SEEK MEDICAL CARE IF:   You have back pain.  You develop a fever.  Your symptoms do not begin to resolve within 3 days. SEEK IMMEDIATE MEDICAL CARE IF:   You have severe back pain or lower abdominal pain.  You develop chills.  You have nausea or vomiting.  You have continued burning or discomfort with urination. MAKE SURE YOU:   Understand these instructions.  Will watch your condition.  Will get help right away if you are not doing well or get worse. Document Released: 05/01/2005 Document Revised: 01/21/2012 Document Reviewed: 08/30/2011 [facility] Patient Information 2015 [place], [place]. This information is not intended to replace advice given to you by your health care provider. Make sure you discuss any questions you have with your health care provider.

## 2014-07-08 LAB — URINALYSIS, MICROSCOPIC ONLY
Casts: NONE SEEN
Crystals: NONE SEEN
Squamous Epithelial / LPF: NONE SEEN
WBC, UA: >50 WBC/hpf — AB (ref <3)

## 2014-07-08 LAB — URINALYSIS, ROUTINE W REFLEX MICROSCOPIC
Bilirubin Urine: NEGATIVE
Glucose, UA: NEGATIVE mg/dL
Ketones, ur: NEGATIVE mg/dL
Nitrite: POSITIVE — AB
Protein, ur: NEGATIVE mg/dL
Specific Gravity, Urine: 1.018 (ref 1.005–1.030)
Urobilinogen, UA: 0.2 mg/dL (ref 0.0–1.0)
pH: 5.5 (ref 5.0–8.0)

## 2014-07-10 LAB — URINE CULTURE: Colony Count: >=100000

## 2014-08-08 ENCOUNTER — Other Ambulatory Visit: Payer: Self-pay | Admitting: *Deleted

## 2014-08-08 DIAGNOSIS — M1990 Unspecified osteoarthritis, unspecified site: Secondary | ICD-10-CM

## 2014-08-08 DIAGNOSIS — I10 Essential (primary) hypertension: Secondary | ICD-10-CM

## 2014-08-08 DIAGNOSIS — E039 Hypothyroidism, unspecified: Secondary | ICD-10-CM

## 2014-08-08 DIAGNOSIS — E785 Hyperlipidemia, unspecified: Secondary | ICD-10-CM

## 2014-08-08 MED ORDER — LEVOTHYROXINE SODIUM 112 MCG PO TABS: 112.0000 ug | ORAL_TABLET | Freq: Every day | ORAL | Status: DC

## 2014-08-08 MED ORDER — TEMAZEPAM 30 MG PO CAPS: 30.0000 mg | ORAL_CAPSULE | Freq: Every evening | ORAL | Status: DC | PRN

## 2014-08-08 MED ORDER — CELECOXIB 100 MG PO CAPS: 100.0000 mg | ORAL_CAPSULE | Freq: Every day | ORAL | Status: DC

## 2014-08-08 MED ORDER — ROSUVASTATIN CALCIUM 20 MG PO TABS: 20.0000 mg | ORAL_TABLET | Freq: Every day | ORAL | Status: DC

## 2014-08-08 MED ORDER — OMEPRAZOLE 40 MG PO CPDR: 40.0000 mg | DELAYED_RELEASE_CAPSULE | Freq: Every day | ORAL | Status: DC

## 2014-08-08 MED ORDER — LISINOPRIL 20 MG PO TABS: 20.0000 mg | ORAL_TABLET | Freq: Every day | ORAL | Status: DC

## 2014-08-10 ENCOUNTER — Ambulatory Visit (INDEPENDENT_AMBULATORY_CARE_PROVIDER_SITE_OTHER): Payer: Commercial Managed Care - HMO

## 2014-08-10 DIAGNOSIS — N3001 Acute cystitis with hematuria: Secondary | ICD-10-CM | POA: Diagnosis not present

## 2014-08-10 NOTE — Progress Notes (Signed)
Patient ID: Kimberly Cook, female   DOB: [birth year], 69 y.o.   MRN: [id number] Patient here today for a one month nurse visit to recheck abnormal urine.

## 2014-08-11 ENCOUNTER — Ambulatory Visit: Payer: Self-pay

## 2014-08-11 LAB — URINALYSIS, COMPLETE
Bilirubin Urine: NEGATIVE
Casts: NONE SEEN
Crystals: NONE SEEN
Glucose, UA: NEGATIVE mg/dL
Ketones, ur: NEGATIVE mg/dL
Nitrite: POSITIVE — AB
Protein, ur: NEGATIVE mg/dL
Specific Gravity, Urine: 1.02 (ref 1.005–1.030)
Squamous Epithelial / LPF: NONE SEEN
Urobilinogen, UA: 0.2 mg/dL (ref 0.0–1.0)
WBC, UA: >50 WBC/hpf — AB (ref <3)
pH: 5 (ref 5.0–8.0)

## 2014-08-13 LAB — URINE CULTURE: Colony Count: >=100000

## 2014-08-14 MED ORDER — CIPROFLOXACIN HCL 500 MG PO TABS: 500.0000 mg | ORAL_TABLET | Freq: Two times a day (BID) | ORAL | Status: AC

## 2014-08-14 NOTE — Addendum Note (Signed)
Addended by: Quentin Mulling R on: 08/14/2014 04:09 PM   Modules accepted: Orders

## 2014-09-08 ENCOUNTER — Ambulatory Visit: Payer: Self-pay | Admitting: Internal Medicine

## 2014-09-21 ENCOUNTER — Ambulatory Visit (INDEPENDENT_AMBULATORY_CARE_PROVIDER_SITE_OTHER): Payer: Commercial Managed Care - HMO | Admitting: Internal Medicine

## 2014-09-21 ENCOUNTER — Encounter: Payer: Self-pay | Admitting: Internal Medicine

## 2014-09-21 VITALS — BP 120/84 | HR 80 | Temp 97.9°F | Resp 16 | Ht 64.0 in | Wt 169.4 lb

## 2014-09-21 DIAGNOSIS — E559 Vitamin D deficiency, unspecified: Secondary | ICD-10-CM

## 2014-09-21 DIAGNOSIS — N3000 Acute cystitis without hematuria: Secondary | ICD-10-CM | POA: Diagnosis not present

## 2014-09-21 DIAGNOSIS — I10 Essential (primary) hypertension: Secondary | ICD-10-CM

## 2014-09-21 DIAGNOSIS — Z79899 Other long term (current) drug therapy: Secondary | ICD-10-CM

## 2014-09-21 DIAGNOSIS — E039 Hypothyroidism, unspecified: Secondary | ICD-10-CM | POA: Diagnosis not present

## 2014-09-21 DIAGNOSIS — R7309 Other abnormal glucose: Secondary | ICD-10-CM | POA: Diagnosis not present

## 2014-09-21 DIAGNOSIS — M150 Primary generalized (osteo)arthritis: Secondary | ICD-10-CM

## 2014-09-21 DIAGNOSIS — M8949 Other hypertrophic osteoarthropathy, multiple sites: Secondary | ICD-10-CM

## 2014-09-21 DIAGNOSIS — R7303 Prediabetes: Secondary | ICD-10-CM

## 2014-09-21 DIAGNOSIS — E785 Hyperlipidemia, unspecified: Secondary | ICD-10-CM | POA: Diagnosis not present

## 2014-09-21 DIAGNOSIS — M159 Polyosteoarthritis, unspecified: Secondary | ICD-10-CM

## 2014-09-21 MED ORDER — MELOXICAM 15 MG PO TABS: ORAL_TABLET | ORAL | Status: DC

## 2014-09-21 NOTE — Patient Instructions (Signed)

## 2014-09-21 NOTE — Progress Notes (Signed)
Patient ID: Kimberly Cook, female   DOB: 1945-10-21, 69 y.o.   MRN: [id number]   This very nice 69 y.o. [facility] presents for 3 month follow up with Hypertension, Hyperlipidemia, Pre-Diabetes and Vitamin D Deficiency.    Patient is treated for HTN & BP has been controlled at home. Today's BP: 120/84 mmHg. Patient has had no complaints of any cardiac type chest pain, palpitations, dyspnea/orthopnea/PND, dizziness, claudication, or dependent edema.   Hyperlipidemia is controlled with diet & meds. Patient denies myalgias or other med SE's. Last Lipids were not at goal as patient has been off of her meds - Total Cho 244*; HDL 51.80; LDL 154*; Trig 998 on 03/29/2014.   Also, the patient has history of PreDiabetes with A1c 5.7% in 2012  and has had no symptoms of reactive hypoglycemia, diabetic polys, paresthesias or visual blurring.  Last A1c was 5.3% in Jan 2015.   Further, the patient also has history of Vitamin D Deficiency with Vit D 40 in 2013. and supplements vitamin D without any suspected side-effects. Last vitamin D was 60 in Aug 2014.   Medication Sig  . aspirin 325 MG tablet Take 325 mg by mouth daily.  Marland Kitchen VITAMIN D 1000 U Take 1,000 Units by mouth daily.  Marland Kitchen levothyroxine  112 MCG tablet Take 1 tablet (112 mcg total) by mouth daily.  Marland Kitchen lisinopril  20 MG tablet Take 1 tablet (20 mg total) by mouth daily. Take 1 tablet daily for BP  . MAGNESIUM OXIDE PO Take 1 capsule by mouth daily.  Marland Kitchen omeprazole 40 MG capsule Take 1 capsule (40 mg total) by mouth daily.  . rosuvastatin (CRESTOR) 20 MG tablet Take 1 tablet (20 mg total) by mouth daily.  . temazepam (RESTORIL) 30 MG capsule Take 1 capsule (30 mg total) by mouth at bedtime as needed. for sleep  . MIRALAX  Take 17 g by mouth daily.   No Known Allergies  PMHx:   Past Medical History  Diagnosis Date  . Hyperlipidemia   . GERD (gastroesophageal reflux disease)   . Arthritis   . Depression   . Vitamin D deficiency   . Prediabetes   .  Occipital neuralgia   . Hypertension     Normal cardiolite 05/2006 EF 71%   Immunization History  Administered Date(s) Administered  . Influenza-Unspecified 04/18/2013  . Pneumococcal-Unspecified 08/18/2010  . Td 08/18/2005  . Zoster 08/18/2005   Past Surgical History  Procedure Laterality Date  . Breast biopsy Right over 20 years     benign  . Cholecystectomy    . Bladder surgery    . Tonsillectomy and adenoidectomy    . Knee arthroscopy Left 2011  . Abdominal hysterectomy     FHx:    Reviewed / unchanged  SHx:    Reviewed / unchanged  Systems Review:  Constitutional: Denies fever, chills, wt changes, headaches, insomnia, fatigue, night sweats, change in appetite. Eyes: Denies redness, blurred vision, diplopia, discharge, itchy, watery eyes.  ENT: Denies discharge, congestion, post nasal drip, epistaxis, sore throat, earache, hearing loss, dental pain, tinnitus, vertigo, sinus pain, snoring.  CV: Denies chest pain, palpitations, irregular heartbeat, syncope, dyspnea, diaphoresis, orthopnea, PND, claudication or edema. Respiratory: denies cough, dyspnea, DOE, pleurisy, hoarseness, laryngitis, wheezing.  Gastrointestinal: Denies dysphagia, odynophagia, heartburn, reflux, water brash, abdominal pain or cramps, nausea, vomiting, bloating, diarrhea, constipation, hematemesis, melena, hematochezia  or hemorrhoids. Genitourinary: Denies dysuria, frequency, urgency, nocturia, hesitancy, discharge, hematuria or flank pain. Musculoskeletal: Denies arthralgias, myalgias, stiffness, jt. swelling, pain,  limping or strain/sprain.  Skin: Denies pruritus, rash, hives, warts, acne, eczema or change in skin lesion(s). Neuro: No weakness, tremor, incoordination, spasms, paresthesia or pain. Psychiatric: Denies confusion, memory loss or sensory loss. Endo: Denies change in weight, skin or hair change.  Heme/Lymph: No excessive bleeding, bruising or enlarged lymph nodes.  Physical Exam  BP  120/84   Pulse 80  Temp(Src) 97.9 F   Resp 16  Ht    Wt 169 lb 6.4 oz    BMI 29.06   Appears well nourished and in no distress. Eyes: PERRLA, EOMs, conjunctiva no swelling or erythema. Sinuses: No frontal/maxillary tenderness ENT/Mouth: EAC's clear, TM's nl w/o erythema, bulging. Nares clear w/o erythema, swelling, exudates. Oropharynx clear without erythema or exudates. Oral hygiene is good. Tongue normal, non obstructing. Hearing intact.  Neck: Supple. Thyroid nl. Car 2+/2+ without bruits, nodes or JVD. Chest: Respirations nl with BS clear & equal w/o rales, rhonchi, wheezing or stridor.  Cor: Heart sounds normal w/ regular rate and rhythm without sig. murmurs, gallops, clicks, or rubs. Peripheral pulses normal and equal  without edema.  Abdomen: Soft & bowel sounds normal. Non-tender w/o guarding, rebound, hernias, masses, or organomegaly.  Lymphatics: Unremarkable.  Musculoskeletal: Full ROM all peripheral extremities, joint stability, 5/5 strength, and normal gait.  Skin: Warm, dry without exposed rashes, lesions or ecchymosis apparent.  Neuro: Cranial nerves intact, reflexes equal bilaterally. Sensory-motor testing grossly intact. Tendon reflexes grossly intact.  Pysch: Alert & oriented x 3.  Insight and judgement nl & appropriate. No ideations.  Assessment and Plan:  1. Essential hypertension   2. Hyperlipidemia  - Lipid panel  3. Morbid obesity   4. Prediabetes  - Hemoglobin A1c - Insulin, fasting  5. Hypothyroidism, unspecified hypothyroidism type  - TSH  6. Vitamin D deficiency  - Vit D  25 hydroxy (rtn osteoporosis monitoring)  7. Medication management  - CBC with Differential/Platelet - BASIC METABOLIC PANEL WITH GFR - Hepatic function panel - Magnesium  8. Primary osteoarthritis involving multiple joints  - meloxicam (MOBIC) 15 MG tablet; Take 1/2 to 1 tablet daily with foood for pain & inflammation  Dispense: 90 tablet; Refill: 1  9. Acute  cystitis without hematuria  - Urine Microscopic - Urine culture   Recommended regular exercise, BP monitoring, weight control, and discussed med and SE's. Recommended labs to assess and monitor clinical status. Further disposition pending results of labs.

## 2014-09-22 LAB — HEPATIC FUNCTION PANEL
ALT: 21 U/L (ref 0–35)
AST: 23 U/L (ref 0–37)
Albumin: 4.5 g/dL (ref 3.5–5.2)
Alkaline Phosphatase: 89 U/L (ref 39–117)
Bilirubin, Direct: 0.1 mg/dL (ref 0.0–0.3)
Indirect Bilirubin: 0.5 mg/dL (ref 0.2–1.2)
Total Bilirubin: 0.6 mg/dL (ref 0.2–1.2)
Total Protein: 7.1 g/dL (ref 6.0–8.3)

## 2014-09-22 LAB — LIPID PANEL
Cholesterol: 137 mg/dL (ref 0–200)
HDL: 61 mg/dL (ref >39)
LDL Cholesterol: 53 mg/dL (ref 0–99)
Total CHOL/HDL Ratio: 2.2 Ratio
Triglycerides: 117 mg/dL (ref <150)
VLDL: 23 mg/dL (ref 0–40)

## 2014-09-22 LAB — VITAMIN D 25 HYDROXY (VIT D DEFICIENCY, FRACTURES): Vit D, 25-Hydroxy: 37 ng/mL (ref 30–100)

## 2014-09-22 LAB — BASIC METABOLIC PANEL WITH GFR
BUN: 20 mg/dL (ref 6–23)
CO2: 31 mEq/L (ref 19–32)
Calcium: 10.2 mg/dL (ref 8.4–10.5)
Chloride: 101 mEq/L (ref 96–112)
Creat: 0.76 mg/dL (ref 0.50–1.10)
GFR, Est African American: >89 mL/min
GFR, Est Non African American: 80 mL/min
Glucose, Bld: 93 mg/dL (ref 70–99)
Potassium: 4 mEq/L (ref 3.5–5.3)
Sodium: 138 mEq/L (ref 135–145)

## 2014-09-22 LAB — URINE CULTURE
Colony Count: NO GROWTH
Organism ID, Bacteria: NO GROWTH

## 2014-09-22 LAB — CBC WITH DIFFERENTIAL/PLATELET
Basophils Absolute: 0.1 10*3/uL (ref 0.0–0.1)
Basophils Relative: 1 % (ref 0–1)
Eosinophils Absolute: 0.1 10*3/uL (ref 0.0–0.7)
Eosinophils Relative: 1 % (ref 0–5)
HCT: 40.9 % (ref 36.0–46.0)
Hemoglobin: 13.7 g/dL (ref 12.0–15.0)
Lymphocytes Relative: 26 % (ref 12–46)
Lymphs Abs: 1.4 10*3/uL (ref 0.7–4.0)
MCH: 30.7 pg (ref 26.0–34.0)
MCHC: 33.5 g/dL (ref 30.0–36.0)
MCV: 91.7 fL (ref 78.0–100.0)
MPV: 10.7 fL (ref 8.6–12.4)
Monocytes Absolute: 0.4 10*3/uL (ref 0.1–1.0)
Monocytes Relative: 7 % (ref 3–12)
Neutro Abs: 3.4 10*3/uL (ref 1.7–7.7)
Neutrophils Relative %: 65 % (ref 43–77)
Platelets: 191 10*3/uL (ref 150–400)
RBC: 4.46 MIL/uL (ref 3.87–5.11)
RDW: 13.6 % (ref 11.5–15.5)
WBC: 5.3 10*3/uL (ref 4.0–10.5)

## 2014-09-22 LAB — INSULIN, FASTING: Insulin fasting, serum: 13.1 u[IU]/mL (ref 2.0–19.6)

## 2014-09-22 LAB — URINALYSIS, MICROSCOPIC ONLY: Casts: NONE SEEN

## 2014-09-22 LAB — TSH: TSH: 0.787 u[IU]/mL (ref 0.350–4.500)

## 2014-09-22 LAB — HEMOGLOBIN A1C
Hgb A1c MFr Bld: 5.4 % (ref <5.7)
Mean Plasma Glucose: 108 mg/dL (ref <117)

## 2014-09-22 LAB — MAGNESIUM: Magnesium: 1.7 mg/dL (ref 1.5–2.5)

## 2014-09-28 ENCOUNTER — Other Ambulatory Visit: Payer: Self-pay | Admitting: Internal Medicine

## 2014-09-28 ENCOUNTER — Telehealth: Payer: Self-pay | Admitting: *Deleted

## 2014-09-28 DIAGNOSIS — E782 Mixed hyperlipidemia: Secondary | ICD-10-CM

## 2014-09-28 MED ORDER — ATORVASTATIN CALCIUM 80 MG PO TABS: 80.0000 mg | ORAL_TABLET | Freq: Every day | ORAL | Status: DC

## 2014-09-28 NOTE — Telephone Encounter (Signed)
Spoke with patient regarding labs.  Patient was informed that her cholesterol was in a good range last week.  Dr Oneta Rack sent in RX for Atorvastatin 80 mg and patient was instructed to take 1/4 tab on Tuesday, Thursday and Saturday.

## 2014-10-04 ENCOUNTER — Ambulatory Visit: Payer: Medicare Other | Admitting: Internal Medicine

## 2014-10-11 ENCOUNTER — Other Ambulatory Visit: Payer: Self-pay | Admitting: Internal Medicine

## 2014-10-11 MED ORDER — TEMAZEPAM 30 MG PO CAPS: ORAL_CAPSULE | ORAL | Status: DC

## 2015-01-11 ENCOUNTER — Encounter: Payer: Self-pay | Admitting: Physician Assistant

## 2015-01-11 ENCOUNTER — Ambulatory Visit (INDEPENDENT_AMBULATORY_CARE_PROVIDER_SITE_OTHER): Payer: Commercial Managed Care - HMO | Admitting: Physician Assistant

## 2015-01-11 VITALS — BP 128/82 | HR 60 | Temp 97.7°F | Resp 16 | Ht 64.0 in | Wt 167.0 lb

## 2015-01-11 DIAGNOSIS — E785 Hyperlipidemia, unspecified: Secondary | ICD-10-CM | POA: Diagnosis not present

## 2015-01-11 DIAGNOSIS — E039 Hypothyroidism, unspecified: Secondary | ICD-10-CM

## 2015-01-11 DIAGNOSIS — F32A Depression, unspecified: Secondary | ICD-10-CM

## 2015-01-11 DIAGNOSIS — I10 Essential (primary) hypertension: Secondary | ICD-10-CM

## 2015-01-11 DIAGNOSIS — E559 Vitamin D deficiency, unspecified: Secondary | ICD-10-CM

## 2015-01-11 DIAGNOSIS — Z0001 Encounter for general adult medical examination with abnormal findings: Secondary | ICD-10-CM | POA: Diagnosis not present

## 2015-01-11 DIAGNOSIS — Z9181 History of falling: Secondary | ICD-10-CM

## 2015-01-11 DIAGNOSIS — R6889 Other general symptoms and signs: Secondary | ICD-10-CM

## 2015-01-11 DIAGNOSIS — N3000 Acute cystitis without hematuria: Secondary | ICD-10-CM

## 2015-01-11 DIAGNOSIS — R7309 Other abnormal glucose: Secondary | ICD-10-CM | POA: Diagnosis not present

## 2015-01-11 DIAGNOSIS — R7303 Prediabetes: Secondary | ICD-10-CM

## 2015-01-11 DIAGNOSIS — Z79899 Other long term (current) drug therapy: Secondary | ICD-10-CM | POA: Diagnosis not present

## 2015-01-11 DIAGNOSIS — M8580 Other specified disorders of bone density and structure, unspecified site: Secondary | ICD-10-CM

## 2015-01-11 DIAGNOSIS — M1990 Unspecified osteoarthritis, unspecified site: Secondary | ICD-10-CM

## 2015-01-11 DIAGNOSIS — G4700 Insomnia, unspecified: Secondary | ICD-10-CM

## 2015-01-11 DIAGNOSIS — Z0000 Encounter for general adult medical examination without abnormal findings: Secondary | ICD-10-CM

## 2015-01-11 DIAGNOSIS — F329 Major depressive disorder, single episode, unspecified: Secondary | ICD-10-CM

## 2015-01-11 DIAGNOSIS — K219 Gastro-esophageal reflux disease without esophagitis: Secondary | ICD-10-CM

## 2015-01-11 LAB — CBC WITH DIFFERENTIAL/PLATELET
Basophils Absolute: 0 10*3/uL (ref 0.0–0.1)
Basophils Relative: 0 % (ref 0–1)
Eosinophils Absolute: 0.1 10*3/uL (ref 0.0–0.7)
Eosinophils Relative: 2 % (ref 0–5)
HCT: 40.9 % (ref 36.0–46.0)
Hemoglobin: 14.2 g/dL (ref 12.0–15.0)
Lymphocytes Relative: 31 % (ref 12–46)
Lymphs Abs: 1.9 10*3/uL (ref 0.7–4.0)
MCH: 31.4 pg (ref 26.0–34.0)
MCHC: 34.7 g/dL (ref 30.0–36.0)
MCV: 90.5 fL (ref 78.0–100.0)
MPV: 10.8 fL (ref 8.6–12.4)
Monocytes Absolute: 0.4 10*3/uL (ref 0.1–1.0)
Monocytes Relative: 7 % (ref 3–12)
Neutro Abs: 3.6 10*3/uL (ref 1.7–7.7)
Neutrophils Relative %: 60 % (ref 43–77)
Platelets: 212 10*3/uL (ref 150–400)
RBC: 4.52 MIL/uL (ref 3.87–5.11)
RDW: 13.5 % (ref 11.5–15.5)
WBC: 6 10*3/uL (ref 4.0–10.5)

## 2015-01-11 LAB — LIPID PANEL
Cholesterol: 116 mg/dL (ref 0–200)
HDL: 58 mg/dL (ref >=46)
LDL Cholesterol: 36 mg/dL (ref 0–99)
Total CHOL/HDL Ratio: 2 Ratio
Triglycerides: 108 mg/dL (ref <150)
VLDL: 22 mg/dL (ref 0–40)

## 2015-01-11 LAB — BASIC METABOLIC PANEL WITH GFR
BUN: 23 mg/dL (ref 6–23)
CO2: 24 mEq/L (ref 19–32)
Calcium: 9.8 mg/dL (ref 8.4–10.5)
Chloride: 102 mEq/L (ref 96–112)
Creat: 0.82 mg/dL (ref 0.50–1.10)
GFR, Est African American: 84 mL/min
GFR, Est Non African American: 73 mL/min
Glucose, Bld: 90 mg/dL (ref 70–99)
Potassium: 4.1 mEq/L (ref 3.5–5.3)
Sodium: 138 mEq/L (ref 135–145)

## 2015-01-11 LAB — MAGNESIUM: Magnesium: 2 mg/dL (ref 1.5–2.5)

## 2015-01-11 LAB — HEPATIC FUNCTION PANEL
ALT: 17 U/L (ref 0–35)
AST: 17 U/L (ref 0–37)
Albumin: 4.4 g/dL (ref 3.5–5.2)
Alkaline Phosphatase: 100 U/L (ref 39–117)
Bilirubin, Direct: 0.2 mg/dL (ref 0.0–0.3)
Indirect Bilirubin: 0.5 mg/dL (ref 0.2–1.2)
Total Bilirubin: 0.7 mg/dL (ref 0.2–1.2)
Total Protein: 7.1 g/dL (ref 6.0–8.3)

## 2015-01-11 LAB — HEMOGLOBIN A1C
Hgb A1c MFr Bld: 5.2 % (ref <5.7)
Mean Plasma Glucose: 103 mg/dL (ref <117)

## 2015-01-11 LAB — TSH: TSH: 1.571 u[IU]/mL (ref 0.350–4.500)

## 2015-01-11 MED ORDER — TEMAZEPAM 30 MG PO CAPS: ORAL_CAPSULE | ORAL | Status: DC

## 2015-01-11 MED ORDER — SIMVASTATIN 40 MG PO TABS: 40.0000 mg | ORAL_TABLET | Freq: Every evening | ORAL | Status: DC

## 2015-01-11 MED ORDER — OMEPRAZOLE 40 MG PO CPDR: 40.0000 mg | DELAYED_RELEASE_CAPSULE | Freq: Every day | ORAL | Status: DC

## 2015-01-11 MED ORDER — LISINOPRIL 20 MG PO TABS: 20.0000 mg | ORAL_TABLET | Freq: Every day | ORAL | Status: DC

## 2015-01-11 NOTE — Patient Instructions (Addendum)
Please pick one of the over the counter allergy medications below and take it once daily for allergies.  Claritin or loratadine cheapest but likely the weakest  Zyrtec or certizine at night because it can make you sleepy The strongest is allegra or fexafinadine  Cheapest at [facility], [employer], [employer]  Stop the lipitor Start the simvastatin , 1/2 pill a day.   I think it is possible that you have sleep apnea. It can cause interrupted sleep, headaches, frequent awakenings, fatigue, dry mouth, fast/slow heart beats, memory issues, anxiety/depression, swelling, numbness tingling hands/feet, weight gain, shortness of breath, and the list goes on. Sleep apnea needs to be ruled out because if it is left untreated it does eventually lead to abnormal heart beats, lung failure or heart failure as well as increasing the risk of heart attack and stroke. There are masks you can wear OR a mouth piece that I can give you information about. Often times though people feel MUCH better after getting treatment.   Sleep Apnea  Sleep apnea is a sleep disorder characterized by abnormal pauses in breathing while you sleep. When your breathing pauses, the level of oxygen in your blood decreases. This causes you to move out of deep sleep and into light sleep. As a result, your quality of sleep is poor, and the system that carries your blood throughout your body (cardiovascular system) experiences stress. If sleep apnea remains untreated, the following conditions can develop:  High blood pressure (hypertension).  Coronary artery disease.  Inability to achieve or maintain an erection (impotence).  Impairment of your thought process (cognitive dysfunction). There are three types of sleep apnea: 1. Obstructive sleep apnea--Pauses in breathing during sleep because of a blocked airway. 2. Central sleep apnea--Pauses in breathing during sleep because the area of the brain that controls your breathing does not send the correct  signals to the muscles that control breathing. 3. Mixed sleep apnea--A combination of both obstructive and central sleep apnea.  RISK FACTORS The following risk factors can increase your risk of developing sleep apnea:  Being overweight.  Smoking.  Having narrow passages in your nose and throat.  Being of older age.  Being female.  Alcohol use.  Sedative and tranquilizer use.  Ethnicity. Among individuals younger than 35 years, African Americans are at increased risk of sleep apnea. SYMPTOMS   Difficulty staying asleep.  Daytime sleepiness and fatigue.  Loss of energy.  Irritability.  Loud, heavy snoring.  Morning headaches.  Trouble concentrating.  Forgetfulness.  Decreased interest in sex. DIAGNOSIS  In order to diagnose sleep apnea, your caregiver will perform a physical examination. Your caregiver may suggest that you take a home sleep test. Your caregiver may also recommend that you spend the night in a sleep lab. In the sleep lab, several monitors record information about your heart, lungs, and brain while you sleep. Your leg and arm movements and blood oxygen level are also recorded. TREATMENT The following actions may help to resolve mild sleep apnea:  Sleeping on your side.   Using a decongestant if you have nasal congestion.   Avoiding the use of depressants, including alcohol, sedatives, and narcotics.   Losing weight and modifying your diet if you are overweight. There also are devices and treatments to help open your airway:  Oral appliances. These are custom-made mouthpieces that shift your lower jaw forward and slightly open your bite. This opens your airway.  Devices that create positive airway pressure. This positive pressure "splints" your airway open to  help you breathe better during sleep. The following devices create positive airway pressure:  Continuous positive airway pressure (CPAP) device. The CPAP device creates a continuous level of  air pressure with an air pump. The air is delivered to your airway through a mask while you sleep. This continuous pressure keeps your airway open.  Nasal expiratory positive airway pressure (EPAP) device. The EPAP device creates positive air pressure as you exhale. The device consists of single-use valves, which are inserted into each nostril and held in place by adhesive. The valves create very little resistance when you inhale but create much more resistance when you exhale. That increased resistance creates the positive airway pressure. This positive pressure while you exhale keeps your airway open, making it easier to breath when you inhale again.  Bilevel positive airway pressure (BPAP) device. The BPAP device is used mainly in patients with central sleep apnea. This device is similar to the CPAP device because it also uses an air pump to deliver continuous air pressure through a mask. However, with the BPAP machine, the pressure is set at two different levels. The pressure when you exhale is lower than the pressure when you inhale.  Surgery. Typically, surgery is only done if you cannot comply with less invasive treatments or if the less invasive treatments do not improve your condition. Surgery involves removing excess tissue in your airway to create a wider passage way. Document Released: 07/12/2002 Document Revised: 11/16/2012 Document Reviewed: 11/28/2011 [facility] Patient Information 2015 [place], [place]. This information is not intended to replace advice given to you by your health care provider. Make sure you discuss any questions you have with your health care provider.    Before you even begin to attack a weight-loss plan, it pays to remember this: You are not fat. You have fat. Losing weight isn't about blame or shame; it's simply another achievement to accomplish. Dieting is like any other skill-you have to buckle down and work at it. As long as you act in a smart, reasonable way, you'll  ultimately get where you want to be. Here are some weight loss pearls for you.  1. It's Not a Diet. It's a Lifestyle Thinking of a diet as something you're on and suffering through only for the short term doesn't work. To shed weight and keep it off, you need to make permanent changes to the way you eat. It's OK to indulge occasionally, of course, but if you cut calories temporarily and then revert to your old way of eating, you'll gain back the weight quicker than you can say yo-yo. Use it to lose it. Research shows that one of the best predictors of long-term weight loss is how many pounds you drop in the first month. For that reason, nutritionists often suggest being stricter for the first two weeks of your new eating strategy to build momentum. Cut out added sugar and alcohol and avoid unrefined carbs. After that, figure out how you can reincorporate them in a way that's healthy and maintainable.  2. There's a Right Way to Exercise Working out burns calories and fat and boosts your metabolism by building muscle. But those trying to lose weight are notorious for overestimating the number of calories they burn and underestimating the amount they take in. Unfortunately, your system is biologically programmed to hold on to extra pounds and that means when you start exercising, your body senses the deficit and ramps up its hunger signals. If you're not diligent, you'll eat everything you burn  and then some. Use it to lose it. Cardio gets all the exercise glory, but strength and interval training are the real heroes. They help you build lean muscle, which in turn increases your metabolism and calorie-burning ability 3. Don't Overreact to Mild Hunger Some people have a hard time losing weight because of hunger anxiety. To them, being hungry is bad-something to be avoided at all costs-so they carry snacks with them and eat when they don't need to. Others eat because they're stressed out or bored. While you never  want to get to the point of being ravenous (that's when bingeing is likely to happen), a hunger pang, a craving, or the fact that it's 3:00 p.m. should not send you racing for the vending machine or obsessing about the energy bar in your purse. Ideally, you should put off eating until your stomach is growling and it's difficult to concentrate.  Use it to lose it. When you feel the urge to eat, use the HALT method. Ask yourself, Am I really hungry? Or am I angry or anxious, lonely or bored, or tired? If you're still not certain, try the apple test. If you're truly hungry, an apple should seem delicious; if it doesn't, something else is going on. Or you can try drinking water and making yourself busy, if you are still hungry try a healthy snack.  4. Not All Calories Are Created Equal The mechanics of weight loss are pretty simple: Take in fewer calories than you use for energy. But the kind of food you eat makes all the difference. Processed food that's high in saturated fat and refined starch or sugar can cause inflammation that disrupts the hormone signals that tell your brain you're full. The result: You eat a lot more.  Use it to lose it. Clean up your diet. Swap in whole, unprocessed foods, including vegetables, lean protein, and healthy fats that will fill you up and give you the biggest nutritional bang for your calorie buck. In a few weeks, as your brain starts receiving regular hunger and fullness signals once again, you'll notice that you feel less hungry overall and naturally start cutting back on the amount you eat.  5. Protein, Produce, and Plant-Based Fats Are Your Weight-Loss Trinity Here's why eating the three Ps regularly will help you drop pounds. Protein fills you up. You need it to build lean muscle, which keeps your metabolism humming so that you can torch more fat. People in a weight-loss program who ate double the recommended daily allowance for protein (about 110 grams for a 150-pound  woman) lost 70 percent of their weight from fat, while people who ate the RDA lost only about 40 percent, one study found. Produce is packed with filling fiber. "It's very difficult to consume too many calories if you're eating a lot of vegetables. Example: Three cups of broccoli is a lot of food, yet only 93 calories. (Fruit is another story. It can be easy to overeat and can contain a lot of calories from sugar, so be sure to monitor your intake.) Plant-based fats like olive oil and those in avocados and nuts are healthy and extra satiating.  Use it to lose it. Aim to incorporate each of the three Ps into every meal and snack. People who eat protein throughout the day are able to keep weight off, according to a study in the American Journal of Clinical Nutrition. In addition to meat, poultry and seafood, good sources are beans, lentils, eggs, tofu, and yogurt.  As for fat, keep portion sizes in check by measuring out salad dressing, oil, and nut butters (shoot for one to two tablespoons). Finally, eat veggies or a little fruit at every meal. People who did that consumed 308 fewer calories but didn't feel any hungrier than when they didn't eat more produce.  7. How You Eat Is As Important As What You Eat In order for your brain to register that you're full, you need to focus on what you're eating. Sit down whenever you eat, preferably at a table. Turn off the TV or computer, put down your phone, and look at your food. Smell it. Chew slowly, and don't put another bite on your fork until you swallow. When women ate lunch this attentively, they consumed 30 percent less when snacking later than those who listened to an audiobook at lunchtime, according to a study in the [place] Journal of Nutrition. 8. Weighing Yourself Really Works The scale provides the best evidence about whether your efforts are paying off. Seeing the numbers tick up or down or stagnate is motivation to keep going-or to rethink your  approach. A 2015 study at [facility] found that daily weigh-ins helped people lose more weight, keep it off, and maintain that loss, even after two years. Use it to lose it. Step on the scale at the same time every day for the best results. If your weight shoots up several pounds from one weigh-in to the next, don't freak out. Eating a lot of salt the night before or having your period is the likely culprit. The number should return to normal in a day or two. It's a steady climb that you need to do something about. 9. Too Much Stress and Too Little Sleep Are Your Enemies When you're tired and frazzled, your body cranks up the production of cortisol, the stress hormone that can cause carb cravings. Not getting enough sleep also boosts your levels of ghrelin, a hormone associated with hunger, while suppressing leptin, a hormone that signals fullness and satiety. People on a diet who slept only five and a half hours a night for two weeks lost 55 percent less fat and were hungrier than those who slept eight and a half hours, according to a study in the [place] [employer] Journal. Use it to lose it. Prioritize sleep, aiming for seven hours or more a night, which research shows helps lower stress. And make sure you're getting quality zzz's. If a snoring spouse or a fidgety cat wakes you up frequently throughout the night, you may end up getting the equivalent of just four hours of sleep, according to a study from [facility]. Keep pets out of the bedroom, and use a white-noise app to drown out snoring. 10. You Will Hit a plateau-And You Can Bust Through It As you slim down, your body releases much less leptin, the fullness hormone.  If you're not strength training, start right now. Building muscle can raise your metabolism to help you overcome a plateau. To keep your body challenged and burning calories, incorporate new moves and more intense intervals into your workouts or add another  sweat session to your weekly routine. Alternatively, cut an extra 100 calories or so a day from your diet. Now that you've lost weight, your body simply doesn't need as much fuel.

## 2015-01-11 NOTE — Progress Notes (Signed)
MEDICARE ANNUAL WELLNESS VISIT AND FOLLOW UP  Assessment:   1. Essential hypertension - continue medications, DASH diet, exercise and monitor at home. Call if greater than 130/80.  - CBC with Differential/Platelet - BASIC METABOLIC PANEL WITH GFR - Hepatic function panel - Urinalysis, Routine w reflex microscopic (not at [facility]) - lisinopril (PRINIVIL,ZESTRIL) 20 MG tablet; Take 1 tablet (20 mg total) by mouth daily. Take 1 tablet daily for BP  Dispense: 90 tablet; Refill: 1  2. Hyperlipidemia -continue medications, check lipids, decrease fatty foods, increase activity.  - Lipid panel Will switch from lipitor to zocor for cost - simvastatin (ZOCOR) 40 MG tablet; Take 1 tablet (40 mg total) by mouth every evening.  Dispense: 90 tablet; Refill: 0  3. Prediabetes Discussed general issues about diabetes pathophysiology and management., Educational material distributed., Suggested low cholesterol diet., Encouraged aerobic exercise., Discussed foot care., Reminded to get yearly retinal exam. - Hemoglobin A1c - HM DIABETES FOOT EXAM  4. Hypothyroidism, unspecified hypothyroidism type Hypothyroidism-check TSH level, continue medications the same, reminded to take on an empty stomach 30-80mins before food.  - TSH  5. Morbid obesity Obesity with co morbidities- long discussion about weight loss, diet, and exercise  6. Medication management - Magnesium  7. Vitamin D deficiency - Vit D  25 hydroxy (rtn osteoporosis monitoring)  8. Insomnia  good sleep hygiene discussed, increase day time activity, still think OSA is a possibility but she states due to cost will not get study, continue medication  9. Depression Declines meds at this time, suggest counseling, no SI/HI  10. Gastroesophageal reflux disease, esophagitis presence not specified Continue PPI/H2 blocker, diet discussed  11. Acute cystitis without hematuria - Urine culture  12. Arthritis monitor  13. Osteopenia - DG Bone  Density; Future  Over 30 minutes of exam, counseling, chart review, and critical decision making was performed  Plan:   During the course of the visit the patient was educated and counseled about appropriate screening and preventive services including:    Pneumococcal vaccine   Influenza vaccine  Td vaccine  Screening electrocardiogram  Screening mammography  Bone densitometry screening  Colorectal cancer screening  Diabetes screening  Glaucoma screening  Nutrition counseling   Advanced directives: given info/requested  Conditions/risks identified: Diabetes is at goal Urinary Incontinence is not an issue: discussed non pharmacology and pharmacology options.  Fall risk: moderate- discussed PT, home fall assessment, medications.    Subjective:   Kimberly Cook is a 69 y.o. female who presents for Medicare Annual Wellness Visit and 3 month follow up on hypertension, prediabetes, hyperlipidemia, vitamin D def.  Date of last medicare wellness visit was 11/10/2013  Her blood pressure has been controlled at home, today their BP is BP: 128/82 mmHg She does not workout but will walk to her sons house, went shopping with her friend and would feel fatigue, some SOB, and would have to sit down. . She denies chest pain, dizziness.  She is on cholesterol medication, lipitor 80 and denies myalgias. Her cholesterol is at goal. The cholesterol last visit was:   Lab Results  Component Value Date   CHOL 137 09/21/2014   HDL 61 09/21/2014   LDLCALC 53 09/21/2014   TRIG 117 09/21/2014   CHOLHDL 2.2 09/21/2014   She at one time in 2012 was prediabetic with A1C 5.7 but has done a good job with weight loss and diet. Last A1C in the office was:  Lab Results  Component Value Date   HGBA1C 5.4 09/21/2014  Patient is on Vitamin D supplement. Lab Results  Component Value Date   VD25OH 37 09/21/2014     She has insomnia, on restoril 30mg  which helps.  She is on thyroid  medication. Her medication was not changed last visit.   Lab Results  Component Value Date   TSH 0.787 09/21/2014   BMI is Body mass index is 28.65 kg/(m^2)., she is working on diet and exercise. Wt Readings from Last 3 Encounters:  01/11/15 167 lb (75.751 kg)  09/21/14 169 lb 6.4 oz (76.839 kg)  07/07/14 171 lb (77.565 kg)   She has been having fatigue, heat intolerance.  After she eats dinner, she feels nausea with occ vomiting. She is on her prilosec but no mobic.  She is getting up 2-3 x a night, feeling frequency, has history of recurrent UTI.  She states that she has financial hardship and is unable to afford the lipitor.   Medication Review Current Outpatient Prescriptions on File Prior to Visit  Medication Sig Dispense Refill  . aspirin 325 MG tablet Take 325 mg by mouth daily.    Marland Kitchen levothyroxine (SYNTHROID, LEVOTHROID) 112 MCG tablet Take 1 tablet (112 mcg total) by mouth daily. 90 tablet 1  . MAGNESIUM OXIDE PO Take 1 capsule by mouth daily.    . meloxicam (MOBIC) 15 MG tablet Take 1/2 to 1 tablet daily with foood for pain & inflammation 90 tablet 1   No current facility-administered medications on file prior to visit.    Current Problems (verified) Patient Active Problem List   Diagnosis Date Noted  . Morbid obesity 09/21/2014  . Medication management 09/21/2014  . Insomnia 02/17/2014  . Hypothyroidism 02/17/2014  . Hypertension   . Hyperlipidemia   . GERD (gastroesophageal reflux disease)   . Vitamin D deficiency   . Depression   . Prediabetes     Screening Tests Immunization History  Administered Date(s) Administered  . Influenza-Unspecified 04/18/2013, 04/05/2014  . Pneumococcal-Unspecified 08/18/2010  . Td 08/18/2005  . Zoster 08/18/2005    Preventative care: Last colonoscopy: 2012 due 2022 Last mammogram: 03/2014, due this year Last pap smear/pelvic exam: 2011 DEXA: 03/2013 osteopenia, due this year Echo 2009  Prior vaccinations: TD or Tdap:  2007  Influenza: 2015 Pneumococcal: 2012 Prevnar13: DUE but out of in the office Shingles/Zostavax: 2007  Names of Other Physician/Practitioners you currently use: 1. [facility]- here for primary care 2. [facility], eye doctor, last visit 2012 Patient Care Team: Lorre Munroe, NP as PCP - General (Internal Medicine) Charlott Rakes, MD as Consulting Physician (Gastroenterology) Salvatore Marvel, MD as Consulting Physician (Orthopedic Surgery)  Past Surgical History  Procedure Laterality Date  . Breast biopsy Right over 20 years     benign  . Cholecystectomy    . Bladder surgery    . Tonsillectomy and adenoidectomy    . Knee arthroscopy Left 2011  . Abdominal hysterectomy     Family History  Problem Relation Age of Onset  . Heart disease Mother   . Hypertension Mother   . Diabetes Mother   . Heart disease Father   . Cancer Maternal Aunt     breast  . Stroke Neg Hx    History  Substance Use Topics  . Smoking status: Never Smoker   . Smokeless tobacco: Never Used  . Alcohol Use: No    MEDICARE WELLNESS OBJECTIVES: Tobacco use: She does not smoke.  Patient is not a former smoker. Alcohol Current alcohol use:  none Caffeine Current caffeine use: coffee 1 /day Osteoporosis: postmenopausal estrogen deficiency and dietary calcium and/or vitamin D deficiency, History of fracture in the past year: no Diet: in general, a "healthy" diet   Physical activity: none Depression/mood screen:   Depression screen [facility] 2/9 01/11/2015  Decreased Interest 1  Down, Depressed, Hopeless 1  PHQ - 2 Score 2  Altered sleeping 1  Tired, decreased energy 1  Change in appetite 1  Feeling bad or failure about yourself  0  Trouble concentrating 0  Moving slowly or fidgety/restless 0  Suicidal thoughts 0  PHQ-9 Score 5  Difficult doing work/chores Somewhat difficult   Hearing: normal Visual acuity: normal,  does not perform annual eye exam   ADLs:  In your present state of health, do you have any difficulty performing the following activities: 01/11/2015  Hearing? N  Vision? N  Difficulty concentrating or making decisions? N  Walking or climbing stairs? N  Dressing or bathing? N  Doing errands, shopping? N  Preparing Food and eating ? N  Using the Toilet? N  In the past six months, have you accidently leaked urine? N  Do you have problems with loss of bowel control? N  Managing your Medications? N  Housekeeping or managing your Housekeeping? N    Fall risk: Moderate Risk Cognitive Testing  Alert? Yes  Normal Appearance?Yes  Oriented to person? Yes  Place? Yes   Time? Yes  Recall of three objects?  Yes  Can perform simple calculations? Yes  Displays appropriate judgment?Yes  Can read the correct time from a watch face?Yes EOL planning: Does patient have an advance directive?: Yes Type of Advance Directive: Healthcare Power of Attorney, Living will Does patient want to make changes to advanced directive?: No - Patient declined Copy of advanced directive(s) in chart?: No - copy requested     Objective:   Blood pressure 128/82, pulse 60, temperature 97.7 F (36.5 C), resp. rate 16, height  (1.626 m), weight 167 lb (75.751 kg). Body mass index is 28.65 kg/(m^2).  General appearance: alert, no distress, WD/WN,  female HEENT: normocephalic, sclerae anicteric, TMs pearly, nares patent, no discharge or erythema, pharynx normal Oral cavity: MMM, no lesions Neck: supple, no lymphadenopathy, no thyromegaly, no masses Heart: RRR, normal S1, S2, no murmurs Lungs: CTA bilaterally, no wheezes, rhonchi, or rales Abdomen: +bs, soft, mild epigastric tender, non distended, no masses, no hepatomegaly, no splenomegaly Musculoskeletal: nontender, no swelling, no obvious deformity Extremities: no edema, no cyanosis, no clubbing Pulses: 2+ symmetric, upper and lower extremities, normal cap refill Neurological: alert, oriented  x 3, CN2-12 intact, strength normal upper extremities and lower extremities, sensation normal throughout, DTRs 2+ throughout, no cerebellar signs, gait normal Psychiatric: normal affect, behavior normal, pleasant  Breast: defer Gyn: defer Rectal: defer   Medicare Attestation I have personally reviewed: The patient's medical and social history Their use of alcohol, tobacco or illicit drugs Their current medications and supplements The patient's functional ability including ADLs,fall risks, home safety risks, cognitive, and hearing and visual impairment Diet and physical activities Evidence for depression or mood disorders  The patient's weight, height, BMI, and visual acuity have been recorded in the chart.  I have made referrals, counseling, and provided education to the patient based on review of the above and I have provided the patient with a written personalized care plan for preventive services.     Quentin Mulling, PA-C   01/11/2015

## 2015-01-12 LAB — URINALYSIS, ROUTINE W REFLEX MICROSCOPIC
Bilirubin Urine: NEGATIVE
Glucose, UA: NEGATIVE mg/dL
Hgb urine dipstick: NEGATIVE
Ketones, ur: NEGATIVE mg/dL
Nitrite: NEGATIVE
Protein, ur: NEGATIVE mg/dL
Specific Gravity, Urine: 1.013 (ref 1.005–1.030)
Urobilinogen, UA: 0.2 mg/dL (ref 0.0–1.0)
pH: 5 (ref 5.0–8.0)

## 2015-01-12 LAB — URINALYSIS, MICROSCOPIC ONLY
Casts: NONE SEEN
Crystals: NONE SEEN
Squamous Epithelial / LPF: NONE SEEN
WBC, UA: >50 WBC/hpf — AB (ref <3)

## 2015-01-12 LAB — VITAMIN D 25 HYDROXY (VIT D DEFICIENCY, FRACTURES): Vit D, 25-Hydroxy: 40 ng/mL (ref 30–100)

## 2015-01-13 LAB — URINE CULTURE: Colony Count: >=100000

## 2015-01-14 MED ORDER — CIPROFLOXACIN HCL 250 MG PO TABS: 250.0000 mg | ORAL_TABLET | Freq: Two times a day (BID) | ORAL | Status: AC

## 2015-01-14 NOTE — Addendum Note (Signed)
Addended by: Quentin Mulling R on: 01/14/2015 12:43 PM   Modules accepted: Orders

## 2015-01-16 ENCOUNTER — Ambulatory Visit: Payer: Self-pay | Admitting: Physician Assistant

## 2015-01-18 ENCOUNTER — Other Ambulatory Visit: Payer: Self-pay

## 2015-01-18 DIAGNOSIS — E785 Hyperlipidemia, unspecified: Secondary | ICD-10-CM

## 2015-01-18 MED ORDER — SIMVASTATIN 40 MG PO TABS: 40.0000 mg | ORAL_TABLET | Freq: Every evening | ORAL | Status: DC

## 2015-02-09 ENCOUNTER — Ambulatory Visit (INDEPENDENT_AMBULATORY_CARE_PROVIDER_SITE_OTHER): Payer: Commercial Managed Care - HMO

## 2015-02-09 DIAGNOSIS — N390 Urinary tract infection, site not specified: Secondary | ICD-10-CM | POA: Diagnosis not present

## 2015-02-09 DIAGNOSIS — R6889 Other general symptoms and signs: Secondary | ICD-10-CM | POA: Diagnosis not present

## 2015-02-09 LAB — BASIC METABOLIC PANEL WITH GFR
BUN: 16 mg/dL (ref 6–23)
CO2: 26 mEq/L (ref 19–32)
Calcium: 9.5 mg/dL (ref 8.4–10.5)
Chloride: 101 mEq/L (ref 96–112)
Creat: 0.92 mg/dL (ref 0.50–1.10)
GFR, Est African American: 73 mL/min
GFR, Est Non African American: 63 mL/min
Glucose, Bld: 113 mg/dL — ABNORMAL HIGH (ref 70–99)
Potassium: 4 mEq/L (ref 3.5–5.3)
Sodium: 139 mEq/L (ref 135–145)

## 2015-02-09 NOTE — Addendum Note (Signed)
Addended by: Ralph Leyden A on: 02/09/2015 10:18 AM   Modules accepted: Level of Service

## 2015-02-09 NOTE — Progress Notes (Signed)
Patient ID: Kimberly Cook, female   DOB: April 16, 1946, 69 y.o.   MRN: [id number] Patient here today for a one month follow up on abnormal labs. Today will draw BMP and recheck urine with UA and C&S per Quentin Mulling, PA.

## 2015-02-10 LAB — URINALYSIS, COMPLETE
Bacteria, UA: NONE SEEN
Bilirubin Urine: NEGATIVE
Casts: NONE SEEN
Glucose, UA: NEGATIVE mg/dL
Hgb urine dipstick: NEGATIVE
Ketones, ur: NEGATIVE mg/dL
Nitrite: NEGATIVE
Protein, ur: NEGATIVE mg/dL
Specific Gravity, Urine: 1.014 (ref 1.005–1.030)
Squamous Epithelial / LPF: NONE SEEN
Urobilinogen, UA: 0.2 mg/dL (ref 0.0–1.0)
pH: 5.5 (ref 5.0–8.0)

## 2015-02-10 LAB — URINE CULTURE: Colony Count: 8000

## 2015-03-09 DIAGNOSIS — Z4789 Encounter for other orthopedic aftercare: Secondary | ICD-10-CM | POA: Diagnosis not present

## 2015-03-09 DIAGNOSIS — M1712 Unilateral primary osteoarthritis, left knee: Secondary | ICD-10-CM | POA: Diagnosis not present

## 2015-03-09 DIAGNOSIS — M25562 Pain in left knee: Secondary | ICD-10-CM | POA: Diagnosis not present

## 2015-03-15 ENCOUNTER — Ambulatory Visit (INDEPENDENT_AMBULATORY_CARE_PROVIDER_SITE_OTHER): Payer: Commercial Managed Care - HMO | Admitting: Internal Medicine

## 2015-03-15 DIAGNOSIS — I10 Essential (primary) hypertension: Secondary | ICD-10-CM | POA: Diagnosis not present

## 2015-03-15 DIAGNOSIS — Z79899 Other long term (current) drug therapy: Secondary | ICD-10-CM

## 2015-03-15 DIAGNOSIS — M129 Arthropathy, unspecified: Secondary | ICD-10-CM | POA: Diagnosis not present

## 2015-03-15 DIAGNOSIS — R300 Dysuria: Secondary | ICD-10-CM | POA: Diagnosis not present

## 2015-03-15 DIAGNOSIS — M1712 Unilateral primary osteoarthritis, left knee: Secondary | ICD-10-CM

## 2015-03-15 MED ORDER — CIPROFLOXACIN HCL 500 MG PO TABS: 500.0000 mg | ORAL_TABLET | Freq: Two times a day (BID) | ORAL | Status: AC

## 2015-03-15 NOTE — Patient Instructions (Signed)
Take Cipro once in the morning and once in the evening until gone or unless we tell you to stop it.  You can use Azo to help with pressure and the sensation that you need to urinate.    If labs okay we will send over your clearance form tomorrow.

## 2015-03-15 NOTE — Progress Notes (Signed)
   Subjective:    Patient ID: Kimberly Cook, female    DOB: 12/31/1945, 69 y.o.   MRN: [id number]  HPI  Patient presents to the office for surgical clearance for a TKA.  She reports that she is supposed to have left total knee surgery that is supposed to occur in September 12th.  She reports that her knee has been bothering her for the past years and she does not want to wait anymore.  She has not had any cardiac issues.  She has never been a smoker and she does not have any lung issues.    She reports that she does think that she currently has a UTI.  She reports that she has been having some pressure, urinary urgency, and frequency.  She would like to have her urine checked today.  Review of Systems  Constitutional: Negative for fever, chills and fatigue.  HENT: Negative for congestion, postnasal drip, sore throat and voice change.   Respiratory: Negative for cough, chest tightness and shortness of breath.   Cardiovascular: Negative for chest pain and palpitations.  Gastrointestinal: Negative for nausea, vomiting, diarrhea and constipation.  Genitourinary: Positive for dysuria, urgency and frequency. Negative for hematuria.  Musculoskeletal: Positive for arthralgias and gait problem.  Neurological: Negative for dizziness, syncope and light-headedness.       Objective:   Physical Exam  Constitutional: She is oriented to person, place, and time. She appears well-developed and well-nourished. No distress.  HENT:  Head: Normocephalic.  Mouth/Throat: Oropharynx is clear and moist. No oropharyngeal exudate.  Eyes: Conjunctivae are normal. No scleral icterus.  Neck: Normal range of motion. Neck supple. No JVD present. No thyromegaly present.  Cardiovascular: Normal rate, regular rhythm, normal heart sounds and intact distal pulses.  Exam reveals no gallop and no friction rub.   No murmur heard. No bruits, no edema pretibially bilaterally, peripheral pulses present.    Pulmonary/Chest:  Effort normal and breath sounds normal. No respiratory distress. She has no wheezes. She has no rales. She exhibits no tenderness.  Abdominal: Soft. Normal appearance and bowel sounds are normal. She exhibits no distension and no mass. There is tenderness in the suprapubic area. There is no rigidity, no rebound, no guarding, no CVA tenderness, no tenderness at McBurney's point and negative Murphy's sign.  Lymphadenopathy:    She has no cervical adenopathy.  Neurological: She is alert and oriented to person, place, and time.  Skin: Skin is warm and dry. She is not diaphoretic.  Psychiatric: She has a normal mood and affect. Her behavior is normal. Judgment and thought content normal.  Nursing note and vitals reviewed.         Assessment & Plan:    1. Arthritis of left knee -pending labs, patient cleared for surgery with no additional recommendations  2. Medication management  - CBC with Differential/Platelet - BASIC METABOLIC PANEL WITH GFR - Hepatic function panel  3. Dysuria  - Urinalysis, Routine w reflex microscopic (not at [facility]) - Culture, Urine - ciprofloxacin (CIPRO) 500 MG tablet; Take 1 tablet (500 mg total) by mouth 2 (two) times daily.  Dispense: 10 tablet; Refill: 0

## 2015-03-16 LAB — CBC WITH DIFFERENTIAL/PLATELET
Basophils Absolute: 0 10*3/uL (ref 0.0–0.1)
Basophils Relative: 0 % (ref 0–1)
Eosinophils Absolute: 0.2 10*3/uL (ref 0.0–0.7)
Eosinophils Relative: 2 % (ref 0–5)
HCT: 40.2 % (ref 36.0–46.0)
Hemoglobin: 14.2 g/dL (ref 12.0–15.0)
Lymphocytes Relative: 16 % (ref 12–46)
Lymphs Abs: 1.4 10*3/uL (ref 0.7–4.0)
MCH: 32.1 pg (ref 26.0–34.0)
MCHC: 35.3 g/dL (ref 30.0–36.0)
MCV: 90.7 fL (ref 78.0–100.0)
MPV: 10.4 fL (ref 8.6–12.4)
Monocytes Absolute: 0.5 10*3/uL (ref 0.1–1.0)
Monocytes Relative: 6 % (ref 3–12)
Neutro Abs: 6.5 10*3/uL (ref 1.7–7.7)
Neutrophils Relative %: 76 % (ref 43–77)
Platelets: 242 10*3/uL (ref 150–400)
RBC: 4.43 MIL/uL (ref 3.87–5.11)
RDW: 13.2 % (ref 11.5–15.5)
WBC: 8.5 10*3/uL (ref 4.0–10.5)

## 2015-03-16 LAB — URINALYSIS, MICROSCOPIC ONLY
Casts: NONE SEEN [LPF]
Crystals: NONE SEEN [HPF]
Yeast: NONE SEEN [HPF]

## 2015-03-16 LAB — URINALYSIS, ROUTINE W REFLEX MICROSCOPIC
Bilirubin Urine: NEGATIVE
Glucose, UA: NEGATIVE
Ketones, ur: NEGATIVE
Nitrite: POSITIVE — AB
Protein, ur: NEGATIVE
Specific Gravity, Urine: 1.012 (ref 1.001–1.035)
pH: 5.5 (ref 5.0–8.0)

## 2015-03-16 LAB — HEPATIC FUNCTION PANEL
ALT: 17 U/L (ref 6–29)
AST: 17 U/L (ref 10–35)
Albumin: 4.2 g/dL (ref 3.6–5.1)
Alkaline Phosphatase: 99 U/L (ref 33–130)
Bilirubin, Direct: 0.1 mg/dL (ref <=0.2)
Indirect Bilirubin: 0.4 mg/dL (ref 0.2–1.2)
Total Bilirubin: 0.5 mg/dL (ref 0.2–1.2)
Total Protein: 7.2 g/dL (ref 6.1–8.1)

## 2015-03-16 LAB — BASIC METABOLIC PANEL WITH GFR
BUN: 23 mg/dL (ref 7–25)
CO2: 24 mmol/L (ref 20–31)
Calcium: 9.5 mg/dL (ref 8.6–10.4)
Chloride: 100 mmol/L (ref 98–110)
Creat: 0.93 mg/dL (ref 0.60–0.93)
GFR, Est African American: 72 mL/min (ref >=60)
GFR, Est Non African American: 62 mL/min (ref >=60)
Glucose, Bld: 99 mg/dL (ref 65–99)
Potassium: 3.9 mmol/L (ref 3.5–5.3)
Sodium: 137 mmol/L (ref 135–146)

## 2015-03-18 LAB — URINE CULTURE: Colony Count: >=100000

## 2015-03-21 ENCOUNTER — Other Ambulatory Visit: Payer: Self-pay | Admitting: *Deleted

## 2015-03-21 ENCOUNTER — Encounter: Payer: Self-pay | Admitting: Internal Medicine

## 2015-03-21 ENCOUNTER — Ambulatory Visit
Admission: RE | Admit: 2015-03-21 | Discharge: 2015-03-21 | Disposition: A | Discharge status: Home / Self Care | Payer: Commercial Managed Care - HMO | Source: Ambulatory Visit | Attending: Physician Assistant | Admitting: Physician Assistant

## 2015-03-21 DIAGNOSIS — E785 Hyperlipidemia, unspecified: Secondary | ICD-10-CM

## 2015-03-21 DIAGNOSIS — M8580 Other specified disorders of bone density and structure, unspecified site: Secondary | ICD-10-CM

## 2015-03-21 DIAGNOSIS — E039 Hypothyroidism, unspecified: Secondary | ICD-10-CM

## 2015-03-21 MED ORDER — SIMVASTATIN 40 MG PO TABS: 40.0000 mg | ORAL_TABLET | Freq: Every evening | ORAL | Status: DC

## 2015-03-21 MED ORDER — LEVOTHYROXINE SODIUM 112 MCG PO TABS: 112.0000 ug | ORAL_TABLET | Freq: Every day | ORAL | Status: DC

## 2015-03-21 MED ORDER — TEMAZEPAM 30 MG PO CAPS: ORAL_CAPSULE | ORAL | Status: DC

## 2015-03-22 ENCOUNTER — Other Ambulatory Visit: Payer: Self-pay | Admitting: *Deleted

## 2015-03-22 DIAGNOSIS — E039 Hypothyroidism, unspecified: Secondary | ICD-10-CM

## 2015-03-22 DIAGNOSIS — E785 Hyperlipidemia, unspecified: Secondary | ICD-10-CM

## 2015-03-22 MED ORDER — LEVOTHYROXINE SODIUM 112 MCG PO TABS: 112.0000 ug | ORAL_TABLET | Freq: Every day | ORAL | Status: DC

## 2015-03-22 MED ORDER — SIMVASTATIN 40 MG PO TABS: 40.0000 mg | ORAL_TABLET | Freq: Every evening | ORAL | Status: DC

## 2015-04-05 NOTE — Patient Instructions (Signed)
Kimberly Cook  04/05/2015   Your procedure is scheduled on:   04/17/2015    Report to [facility] Main  Entrance take [place]  elevators to 3rd floor to  Short Stay Center at      1040 AM.  Call this number if you have problems the morning of surgery [phone]   Remember: ONLY 1 PERSON MAY GO WITH YOU TO SHORT STAY TO GET  READY MORNING OF YOUR SURGERY.  Do not eat food after midnite.  May have  Clear liquids from 12 midnite until 0630am then nothing by mouth.       Take these medicines the morning of surgery with A SIP OF WATER: Synthroid, Prilosec                               You may not have any metal on your body including hair pins and              piercings  Do not wear jewelry, make-up, lotions, powders or perfumes, deodorant             Do not wear nail polish.  Do not shave  48 hours prior to surgery.               Do not bring valuables to the hospital. [facility] IS NOT             RESPONSIBLE   FOR VALUABLES.  Contacts, dentures or bridgework may not be worn into surgery.  Leave suitcase in the car. After surgery it may be brought to your room.       Special Instructions: coughing and deep breathing exercises, leg exercises               Please read over the following fact sheets you were given: _____________________________________________________________________             [facility] - Preparing for Surgery Before surgery, you can play an important role.  Because skin is not sterile, your skin needs to be as free of germs as possible.  You can reduce the number of germs on your skin by washing with CHG (chlorahexidine gluconate) soap before surgery.  CHG is an antiseptic cleaner which kills germs and bonds with the skin to continue killing germs even after washing. Please DO NOT use if you have an allergy to CHG or antibacterial soaps.  If your skin becomes reddened/irritated stop using the CHG and inform your nurse when you arrive at  Short Stay. Do not shave (including legs and underarms) for at least 48 hours prior to the first CHG shower.  You may shave your face/neck. Please follow these instructions carefully:  1.  Shower with CHG Soap the night before surgery and the  morning of Surgery.  2.  If you choose to wash your hair, wash your hair first as usual with your  normal  shampoo.  3.  After you shampoo, rinse your hair and body thoroughly to remove the  shampoo.                           4.  Use CHG as you would any other liquid soap.  You can apply chg directly  to the skin and wash  Gently with a scrungie or clean washcloth.  5.  Apply the CHG Soap to your body ONLY FROM THE NECK DOWN.   Do not use on face/ open                           Wound or open sores. Avoid contact with eyes, ears mouth and genitals (private parts).                       Wash face,  Genitals (private parts) with your normal soap.             6.  Wash thoroughly, paying special attention to the area where your surgery  will be performed.  7.  Thoroughly rinse your body with warm water from the neck down.  8.  DO NOT shower/wash with your normal soap after using and rinsing off  the CHG Soap.                9.  Pat yourself dry with a clean towel.            10.  Wear clean pajamas.            11.  Place clean sheets on your bed the night of your first shower and do not  sleep with pets. Day of Surgery : Do not apply any lotions/deodorants the morning of surgery.  Please wear clean clothes to the hospital/surgery center.  FAILURE TO FOLLOW THESE INSTRUCTIONS MAY RESULT IN THE CANCELLATION OF YOUR SURGERY PATIENT SIGNATURE_________________________________  NURSE SIGNATURE__________________________________  ________________________________________________________________________    CLEAR LIQUID DIET   Foods Allowed                                                                     Foods Excluded  Coffee and tea,  regular and decaf                             liquids that you cannot  Plain Jell-O in any flavor                                             see through such as: Fruit ices (not with fruit pulp)                                     milk, soups, orange juice  Iced Popsicles                                    All solid food Carbonated beverages, regular and diet                                    Cranberry, grape and apple juices Sports drinks like Gatorade Lightly seasoned clear broth or consume(fat free) Sugar, honey syrup  Sample Menu Breakfast                                Lunch                                     Supper Cranberry juice                    Beef broth                            Chicken broth Jell-O                                     Grape juice                           Apple juice Coffee or tea                        Jell-O                                      Popsicle                                                Coffee or tea                        Coffee or tea  _____________________________________________________________________   WHAT IS A BLOOD TRANSFUSION? Blood Transfusion Information  A transfusion is the replacement of blood or some of its parts. Blood is made up of multiple cells which provide different functions.  Red blood cells carry oxygen and are used for blood loss replacement.  White blood cells fight against infection.  Platelets control bleeding.  Plasma helps clot blood.  Other blood products are available for specialized needs, such as hemophilia or other clotting disorders. BEFORE THE TRANSFUSION  Who gives blood for transfusions?   Healthy volunteers who are fully evaluated to make sure their blood is safe. This is blood bank blood. Transfusion therapy is the safest it has ever been in the practice of medicine. Before blood is taken from a donor, a complete history is taken to make sure that person has no history of diseases nor  engages in risky social behavior (examples are intravenous drug use or sexual activity with multiple partners). The donor's travel history is screened to minimize risk of transmitting infections, such as malaria. The donated blood is tested for signs of infectious diseases, such as HIV and hepatitis. The blood is then tested to be sure it is compatible with you in order to minimize the chance of a transfusion reaction. If you or a relative donates blood, this is often done in anticipation of surgery and is not appropriate for emergency situations. It takes many days to process the donated blood. RISKS AND COMPLICATIONS Although transfusion therapy is very safe and saves many lives, the main dangers of transfusion include:  1. Getting an infectious disease. 2. Developing a transfusion reaction. This is an allergic reaction to something in the blood you were given. Every precaution is taken to prevent this. The decision to have a blood transfusion has been considered carefully by your caregiver before blood is given. Blood is not given unless the benefits outweigh the risks. AFTER THE TRANSFUSION  Right after receiving a blood transfusion, you will usually feel much better and more energetic. This is especially true if your red blood cells have gotten low (anemic). The transfusion raises the level of the red blood cells which carry oxygen, and this usually causes an energy increase.  The nurse administering the transfusion will monitor you carefully for complications. HOME CARE INSTRUCTIONS  No special instructions are needed after a transfusion. You may find your energy is better. Speak with your caregiver about any limitations on activity for underlying diseases you may have. SEEK MEDICAL CARE IF:   Your condition is not improving after your transfusion.  You develop redness or irritation at the intravenous (IV) site. SEEK IMMEDIATE MEDICAL CARE IF:  Any of the following symptoms occur over the  next 12 hours:  Shaking chills.  You have a temperature by mouth above 102 F (38.9 C), not controlled by medicine.  Chest, back, or muscle pain.  People around you feel you are not acting correctly or are confused.  Shortness of breath or difficulty breathing.  Dizziness and fainting.  You get a rash or develop hives.  You have a decrease in urine output.  Your urine turns a dark color or changes to pink, red, or brown. Any of the following symptoms occur over the next 10 days:  You have a temperature by mouth above 102 F (38.9 C), not controlled by medicine.  Shortness of breath.  Weakness after normal activity.  The white part of the eye turns yellow (jaundice).  You have a decrease in the amount of urine or are urinating less often.  Your urine turns a dark color or changes to pink, red, or brown. Document Released: 07/19/2000 Document Revised: 10/14/2011 Document Reviewed: 03/07/2008 ExitCare Patient Information 2014 [place].  _______________________________________________________________________  Incentive Spirometer  An incentive spirometer is a tool that can help keep your lungs clear and active. This tool measures how well you are filling your lungs with each breath. Taking long deep breaths may help reverse or decrease the chance of developing breathing (pulmonary) problems (especially infection) following:  A long period of time when you are unable to move or be active. BEFORE THE PROCEDURE   If the spirometer includes an indicator to show your best effort, your nurse or respiratory therapist will set it to a desired goal.  If possible, sit up straight or lean slightly forward. Try not to slouch.  Hold the incentive spirometer in an upright position. INSTRUCTIONS FOR USE  3. Sit on the edge of your bed if possible, or sit up as far as you can in bed or on a chair. 4. Hold the incentive spirometer in an upright position. 5. Breathe out  normally. 6. Place the mouthpiece in your mouth and seal your lips tightly around it. 7. Breathe in slowly and as deeply as possible, raising the piston or the ball toward the top of the column. 8. Hold your breath for 3-5 seconds or for as long as possible. Allow the piston or ball to fall to the bottom of the column. 9. Remove the mouthpiece from your mouth and breathe out  normally. 10. Rest for a few seconds and repeat Steps 1 through 7 at least 10 times every 1-2 hours when you are awake. Take your time and take a few normal breaths between deep breaths. 11. The spirometer may include an indicator to show your best effort. Use the indicator as a goal to work toward during each repetition. 12. After each set of 10 deep breaths, practice coughing to be sure your lungs are clear. If you have an incision (the cut made at the time of surgery), support your incision when coughing by placing a pillow or rolled up towels firmly against it. Once you are able to get out of bed, walk around indoors and cough well. You may stop using the incentive spirometer when instructed by your caregiver.  RISKS AND COMPLICATIONS  Take your time so you do not get dizzy or light-headed.  If you are in pain, you may need to take or ask for pain medication before doing incentive spirometry. It is harder to take a deep breath if you are having pain. AFTER USE  Rest and breathe slowly and easily.  It can be helpful to keep track of a log of your progress. Your caregiver can provide you with a simple table to help with this. If you are using the spirometer at home, follow these instructions: [place] IF:   You are having difficultly using the spirometer.  You have trouble using the spirometer as often as instructed.  Your pain medication is not giving enough relief while using the spirometer.  You develop fever of 100.5 F (38.1 C) or higher. SEEK IMMEDIATE MEDICAL CARE IF:   You cough up bloody sputum  that had not been present before.  You develop fever of 102 F (38.9 C) or greater.  You develop worsening pain at or near the incision site. MAKE SURE YOU:   Understand these instructions.  Will watch your condition.  Will get help right away if you are not doing well or get worse. Document Released: 12/02/2006 Document Revised: 10/14/2011 Document Reviewed: 02/02/2007 [facility] Patient Information 2014 [place], [place].   ________________________________________________________________________

## 2015-04-06 ENCOUNTER — Encounter (HOSPITAL_COMMUNITY): Payer: Self-pay

## 2015-04-06 ENCOUNTER — Encounter (HOSPITAL_COMMUNITY)
Admission: RE | Admit: 2015-04-06 | Discharge: 2015-04-06 | Disposition: A | Discharge status: Home / Self Care | Payer: Commercial Managed Care - HMO | Source: Ambulatory Visit | Attending: Orthopedic Surgery | Admitting: Orthopedic Surgery

## 2015-04-06 DIAGNOSIS — Z01812 Encounter for preprocedural laboratory examination: Secondary | ICD-10-CM | POA: Insufficient documentation

## 2015-04-06 DIAGNOSIS — M25562 Pain in left knee: Secondary | ICD-10-CM | POA: Diagnosis not present

## 2015-04-06 HISTORY — DX: Cerebral infarction, unspecified: I63.9

## 2015-04-06 HISTORY — DX: Headache, unspecified: R51.9

## 2015-04-06 HISTORY — DX: Urinary tract infection, site not specified: N39.0

## 2015-04-06 HISTORY — DX: Hypothyroidism, unspecified: E03.9

## 2015-04-06 HISTORY — DX: Headache: R51

## 2015-04-06 LAB — APTT: aPTT: 28 seconds (ref 24–37)

## 2015-04-06 LAB — URINALYSIS, ROUTINE W REFLEX MICROSCOPIC
Bilirubin Urine: NEGATIVE
Glucose, UA: NEGATIVE mg/dL
Hgb urine dipstick: NEGATIVE
Ketones, ur: NEGATIVE mg/dL
Nitrite: NEGATIVE
Protein, ur: NEGATIVE mg/dL
Specific Gravity, Urine: 1.028 (ref 1.005–1.030)
Urobilinogen, UA: 0.2 mg/dL (ref 0.0–1.0)
pH: 5 (ref 5.0–8.0)

## 2015-04-06 LAB — SURGICAL PCR SCREEN
MRSA, PCR: NEGATIVE
Staphylococcus aureus: NEGATIVE

## 2015-04-06 LAB — PROTIME-INR
INR: 1.06 (ref 0.00–1.49)
Prothrombin Time: 14 seconds (ref 11.6–15.2)

## 2015-04-06 LAB — CBC
HCT: 38.7 % (ref 36.0–46.0)
Hemoglobin: 13.1 g/dL (ref 12.0–15.0)
MCH: 31.1 pg (ref 26.0–34.0)
MCHC: 33.9 g/dL (ref 30.0–36.0)
MCV: 91.9 fL (ref 78.0–100.0)
Platelets: 176 10*3/uL (ref 150–400)
RBC: 4.21 MIL/uL (ref 3.87–5.11)
RDW: 12.3 % (ref 11.5–15.5)
WBC: 5.3 10*3/uL (ref 4.0–10.5)

## 2015-04-06 LAB — BASIC METABOLIC PANEL
Anion gap: 6 (ref 5–15)
BUN: 21 mg/dL — ABNORMAL HIGH (ref 6–20)
CO2: 27 mmol/L (ref 22–32)
Calcium: 9.8 mg/dL (ref 8.9–10.3)
Chloride: 105 mmol/L (ref 101–111)
Creatinine, Ser: 0.9 mg/dL (ref 0.44–1.00)
GFR calc Af Amer: >60 mL/min (ref >60)
GFR calc non Af Amer: >60 mL/min (ref >60)
Glucose, Bld: 105 mg/dL — ABNORMAL HIGH (ref 65–99)
Potassium: 4.3 mmol/L (ref 3.5–5.1)
Sodium: 138 mmol/L (ref 135–145)

## 2015-04-06 LAB — URINE MICROSCOPIC-ADD ON

## 2015-04-06 LAB — TYPE AND SCREEN
ABO/RH(D): AB POS
Antibody Screen: NEGATIVE

## 2015-04-06 LAB — ABO/RH: ABO/RH(D): AB POS

## 2015-04-06 NOTE — Progress Notes (Signed)
01/11/15 EKG on chart  LOV with Internal Medicine- 03/15/15- EPIC

## 2015-04-06 NOTE — Progress Notes (Signed)
U/A and micro results along with BMP doen 04/06/15 faxed via EPIC to Dr Charlann Boxer.

## 2015-04-06 NOTE — H&P (Signed)
TOTAL KNEE ADMISSION H&P  Patient is being admitted for left total knee arthroplasty.  Subjective:  Chief Complaint:    Left knee primary OA / pain.  HPI: Kimberly Cook, 69 y.o. female, has a history of pain and functional disability in the left knee due to arthritis and has failed non-surgical conservative treatments for greater than 12 weeks to include NSAID's and/or analgesics and activity modification.  Onset of symptoms was gradual, starting 4+ years ago with gradually worsening course since that time. The patient noted prior procedures on the knee to include  arthroscopy on the left knee(s).  Patient currently rates pain in the left knee(s) at 8 out of 10 with activity. Patient has night pain, worsening of pain with activity and weight bearing, pain that interferes with activities of daily living, pain with passive range of motion, crepitus and joint swelling.  Patient has evidence of periarticular osteophytes and joint space narrowing by imaging studies.  There is no active infection.  Risks, benefits and expectations were discussed with the patient.  Risks including but not limited to the risk of anesthesia, blood clots, nerve damage, blood vessel damage, failure of the prosthesis, infection and up to and including death.  Patient understand the risks, benefits and expectations and wishes to proceed with surgery.   PCP: Nadean Corwin, MD  D/C Plans:      Home with HHPT/SNF [facility])  Post-op Meds:       No Rx given  Tranexamic Acid:      To be given - IV  Decadron:      Is to be given  FYI:     Xarelto post-op  (unable to use full dose ASA do to kidney function)  Norco post-op    Patient Active Problem List   Diagnosis Date Noted  . Morbid obesity 09/21/2014  . Medication management 09/21/2014  . Insomnia 02/17/2014  . Hypothyroidism 02/17/2014  . Hypertension   . Hyperlipidemia   . GERD (gastroesophageal reflux disease)   . Vitamin D deficiency   .  Depression   . Prediabetes    Past Medical History  Diagnosis Date  . Hyperlipidemia   . GERD (gastroesophageal reflux disease)   . Arthritis   . Depression   . Vitamin D deficiency   . Prediabetes   . Occipital neuralgia   . Hypertension     Normal cardiolite 05/2006 EF 71%  . Mini stroke 2011   . Hypothyroidism   . Urinary tract infection     hx of   . Headache     Past Surgical History  Procedure Laterality Date  . Breast biopsy Right over 20 years     benign  . Cholecystectomy    . Bladder surgery    . Tonsillectomy and adenoidectomy    . Knee arthroscopy Left 2011  . Abdominal hysterectomy    . Gastroplication       No prescriptions prior to admission   No Known Allergies   Social History  Substance Use Topics  . Smoking status: Never Smoker   . Smokeless tobacco: Never Used  . Alcohol Use: No    Family History  Problem Relation Age of Onset  . Heart disease Mother   . Hypertension Mother   . Diabetes Mother   . Heart disease Father   . Cancer Maternal Aunt     breast  . Stroke Neg Hx      Review of Systems  Constitutional: Negative.   Eyes: Negative.  Respiratory: Negative.   Cardiovascular: Negative.   Gastrointestinal: Positive for heartburn.  Genitourinary: Negative.   Musculoskeletal: Positive for joint pain.  Skin: Negative.   Neurological: Positive for headaches.  Endo/Heme/Allergies: Negative.   Psychiatric/Behavioral: Positive for depression. The patient has insomnia.     Objective:  Physical Exam  Constitutional: She is oriented to person, place, and time. She appears well-developed and well-nourished.  HENT:  Head: Normocephalic and atraumatic.  Eyes: Pupils are equal, round, and reactive to light.  Neck: Neck supple. No JVD present. No tracheal deviation present. No thyromegaly present.  Cardiovascular: Normal rate, regular rhythm, normal heart sounds and intact distal pulses.   Respiratory: Effort normal and breath sounds  normal. No stridor. No respiratory distress. She has no wheezes.  GI: Soft. There is no tenderness. There is no guarding.  Musculoskeletal:       Left knee: She exhibits decreased range of motion, swelling, abnormal alignment (mild valgus) and bony tenderness. She exhibits no ecchymosis, no deformity and no laceration. Tenderness found.  Lymphadenopathy:    She has no cervical adenopathy.  Neurological: She is alert and oriented to person, place, and time.  Skin: Skin is warm and dry.  Psychiatric: She has a normal mood and affect.    Vital signs in last 24 hours: Temp:  [98.5 F (36.9 C)] 98.5 F (36.9 C) (09/01 1038) Pulse Rate:  [81] 81 (09/01 1038) Resp:  [16] 16 (09/01 1038) BP: (106)/(77) 106/77 mmHg (09/01 1038) SpO2:  [100 %] 100 % (09/01 1038) Weight:  [76.204 kg (168 lb)] 76.204 kg (168 lb) (09/01 1038)  Labs:   Estimated body mass index is 28.65 kg/(m^2) as calculated from the following:   Height as of 01/11/15:  (1.626 m).   Weight as of 01/11/15: 75.751 kg (167 lb).   Imaging Review Plain radiographs demonstrate severe degenerative joint disease of the left knee(s). The overall alignment is mild valgus. The bone quality appears to be good for age and reported activity level.  Assessment/Plan:  End stage arthritis, left knee   The patient history, physical examination, clinical judgment of the provider and imaging studies are consistent with end stage degenerative joint disease of the left knee(s) and total knee arthroplasty is deemed medically necessary. The treatment options including medical management, injection therapy arthroscopy and arthroplasty were discussed at length. The risks and benefits of total knee arthroplasty were presented and reviewed. The risks due to aseptic loosening, infection, stiffness, patella tracking problems, thromboembolic complications and other imponderables were discussed. The patient acknowledged the explanation, agreed to proceed  with the plan and consent was signed. Patient is being admitted for inpatient treatment for surgery, pain control, PT, OT, prophylactic antibiotics, VTE prophylaxis, progressive ambulation and ADL's and discharge planning. The patient is planning to be discharged to skilled nursing facility.      Anastasio Auerbach Angellica Maddison   PA-C  04/06/2015, 2:31 PM

## 2015-04-13 ENCOUNTER — Ambulatory Visit: Payer: Self-pay

## 2015-04-13 DIAGNOSIS — M8580 Other specified disorders of bone density and structure, unspecified site: Secondary | ICD-10-CM | POA: Diagnosis not present

## 2015-04-13 DIAGNOSIS — Z1231 Encounter for screening mammogram for malignant neoplasm of breast: Secondary | ICD-10-CM | POA: Diagnosis not present

## 2015-04-13 DIAGNOSIS — M899 Disorder of bone, unspecified: Secondary | ICD-10-CM | POA: Diagnosis not present

## 2015-04-14 ENCOUNTER — Ambulatory Visit: Payer: Self-pay

## 2015-04-17 ENCOUNTER — Encounter (HOSPITAL_COMMUNITY): Payer: Self-pay | Admitting: *Deleted

## 2015-04-17 ENCOUNTER — Encounter (HOSPITAL_COMMUNITY)
Admission: RE | Disposition: A | Discharge status: Skilled Nursing Facility | Payer: Self-pay | Source: Ambulatory Visit | Attending: Orthopedic Surgery

## 2015-04-17 ENCOUNTER — Inpatient Hospital Stay (HOSPITAL_COMMUNITY)
Admission: RE | Admit: 2015-04-17 | Discharge: 2015-04-19 | DRG: 470 | Disposition: A | Discharge status: Skilled Nursing Facility | Payer: Commercial Managed Care - HMO | Source: Ambulatory Visit | Attending: Orthopedic Surgery | Admitting: Orthopedic Surgery

## 2015-04-17 ENCOUNTER — Inpatient Hospital Stay (HOSPITAL_COMMUNITY): Payer: Commercial Managed Care - HMO | Admitting: Anesthesiology

## 2015-04-17 DIAGNOSIS — I10 Essential (primary) hypertension: Secondary | ICD-10-CM | POA: Diagnosis not present

## 2015-04-17 DIAGNOSIS — Z01812 Encounter for preprocedural laboratory examination: Secondary | ICD-10-CM

## 2015-04-17 DIAGNOSIS — Z471 Aftercare following joint replacement surgery: Secondary | ICD-10-CM | POA: Diagnosis not present

## 2015-04-17 DIAGNOSIS — R2681 Unsteadiness on feet: Secondary | ICD-10-CM | POA: Diagnosis not present

## 2015-04-17 DIAGNOSIS — M25562 Pain in left knee: Secondary | ICD-10-CM | POA: Diagnosis not present

## 2015-04-17 DIAGNOSIS — Z8673 Personal history of transient ischemic attack (TIA), and cerebral infarction without residual deficits: Secondary | ICD-10-CM

## 2015-04-17 DIAGNOSIS — Z96652 Presence of left artificial knee joint: Secondary | ICD-10-CM | POA: Diagnosis not present

## 2015-04-17 DIAGNOSIS — E039 Hypothyroidism, unspecified: Secondary | ICD-10-CM | POA: Diagnosis not present

## 2015-04-17 DIAGNOSIS — E663 Overweight: Secondary | ICD-10-CM | POA: Diagnosis not present

## 2015-04-17 DIAGNOSIS — K219 Gastro-esophageal reflux disease without esophagitis: Secondary | ICD-10-CM | POA: Diagnosis not present

## 2015-04-17 DIAGNOSIS — M179 Osteoarthritis of knee, unspecified: Secondary | ICD-10-CM | POA: Diagnosis not present

## 2015-04-17 DIAGNOSIS — M810 Age-related osteoporosis without current pathological fracture: Secondary | ICD-10-CM | POA: Diagnosis not present

## 2015-04-17 DIAGNOSIS — E785 Hyperlipidemia, unspecified: Secondary | ICD-10-CM | POA: Diagnosis present

## 2015-04-17 DIAGNOSIS — Z96659 Presence of unspecified artificial knee joint: Secondary | ICD-10-CM

## 2015-04-17 DIAGNOSIS — M1712 Unilateral primary osteoarthritis, left knee: Secondary | ICD-10-CM | POA: Diagnosis not present

## 2015-04-17 DIAGNOSIS — M659 Synovitis and tenosynovitis, unspecified: Secondary | ICD-10-CM | POA: Diagnosis present

## 2015-04-17 DIAGNOSIS — M6281 Muscle weakness (generalized): Secondary | ICD-10-CM | POA: Diagnosis not present

## 2015-04-17 DIAGNOSIS — D649 Anemia, unspecified: Secondary | ICD-10-CM | POA: Diagnosis not present

## 2015-04-17 DIAGNOSIS — Z6828 Body mass index (BMI) 28.0-28.9, adult: Secondary | ICD-10-CM

## 2015-04-17 DIAGNOSIS — M1990 Unspecified osteoarthritis, unspecified site: Secondary | ICD-10-CM | POA: Diagnosis not present

## 2015-04-17 HISTORY — PX: TOTAL KNEE ARTHROPLASTY: SHX125

## 2015-04-17 SURGERY — ARTHROPLASTY, KNEE, TOTAL
Anesthesia: General | Site: Knee | Laterality: Left

## 2015-04-17 MED ORDER — METOCLOPRAMIDE HCL 10 MG PO TABS: 5.0000 mg | ORAL_TABLET | Freq: Three times a day (TID) | ORAL | Status: DC | PRN

## 2015-04-17 MED ORDER — PHENOL 1.4 % MT LIQD: 1.0000 | OROMUCOSAL | Status: DC | PRN

## 2015-04-17 MED ORDER — PROPOFOL 10 MG/ML IV BOLUS
INTRAVENOUS | Status: DC | PRN
  Administered 2015-04-17: 130 mg via INTRAVENOUS

## 2015-04-17 MED ORDER — BUPIVACAINE-EPINEPHRINE (PF) 0.25% -1:200000 IJ SOLN
INTRAMUSCULAR | Status: AC
  Filled 2015-04-17: qty 30

## 2015-04-17 MED ORDER — SUCCINYLCHOLINE CHLORIDE 20 MG/ML IJ SOLN
INTRAMUSCULAR | Status: DC | PRN
  Administered 2015-04-17: 80 mg via INTRAVENOUS

## 2015-04-17 MED ORDER — SODIUM CHLORIDE 0.9 % IJ SOLN
INTRAMUSCULAR | Status: AC
  Filled 2015-04-17: qty 10

## 2015-04-17 MED ORDER — SODIUM CHLORIDE 0.9 % IR SOLN
Status: DC | PRN
  Administered 2015-04-17: 1000 mL

## 2015-04-17 MED ORDER — HYDROMORPHONE HCL 1 MG/ML IJ SOLN
0.5000 mg | INTRAMUSCULAR | Status: DC | PRN
  Administered 2015-04-17 (×2): 1 mg via INTRAVENOUS
  Administered 2015-04-18: 0.5 mg via INTRAVENOUS
  Administered 2015-04-18: 1 mg via INTRAVENOUS
  Filled 2015-04-17 (×4): qty 1

## 2015-04-17 MED ORDER — NEOSTIGMINE METHYLSULFATE 10 MG/10ML IV SOLN
INTRAVENOUS | Status: DC | PRN
  Administered 2015-04-17: 3 mg via INTRAVENOUS

## 2015-04-17 MED ORDER — MIDAZOLAM HCL 2 MG/2ML IJ SOLN
INTRAMUSCULAR | Status: AC
  Filled 2015-04-17: qty 4

## 2015-04-17 MED ORDER — BISACODYL 10 MG RE SUPP: 10.0000 mg | Freq: Every day | RECTAL | Status: DC | PRN

## 2015-04-17 MED ORDER — TRANEXAMIC ACID 1000 MG/10ML IV SOLN
1000.0000 mg | Freq: Once | INTRAVENOUS | Status: AC
  Administered 2015-04-17: 1000 mg via INTRAVENOUS
  Filled 2015-04-17: qty 10

## 2015-04-17 MED ORDER — NON FORMULARY: 40.0000 mg | Freq: Every day | Status: DC

## 2015-04-17 MED ORDER — CEFAZOLIN SODIUM-DEXTROSE 2-3 GM-% IV SOLR
2.0000 g | INTRAVENOUS | Status: AC
  Administered 2015-04-17: 2 g via INTRAVENOUS

## 2015-04-17 MED ORDER — KETOROLAC TROMETHAMINE 30 MG/ML IJ SOLN
INTRAMUSCULAR | Status: AC
  Filled 2015-04-17: qty 1

## 2015-04-17 MED ORDER — SODIUM CHLORIDE 0.9 % IV SOLN
INTRAVENOUS | Status: DC
  Administered 2015-04-17: 20:00:00 via INTRAVENOUS
  Filled 2015-04-17 (×6): qty 1000

## 2015-04-17 MED ORDER — RIVAROXABAN 10 MG PO TABS
10.0000 mg | ORAL_TABLET | ORAL | Status: DC
  Administered 2015-04-18 – 2015-04-19 (×2): 10 mg via ORAL
  Filled 2015-04-17 (×3): qty 1

## 2015-04-17 MED ORDER — ALUM & MAG HYDROXIDE-SIMETH 200-200-20 MG/5ML PO SUSP: 30.0000 mL | ORAL | Status: DC | PRN

## 2015-04-17 MED ORDER — CHLORHEXIDINE GLUCONATE 4 % EX LIQD: 60.0000 mL | Freq: Once | CUTANEOUS | Status: DC

## 2015-04-17 MED ORDER — ONDANSETRON HCL 4 MG/2ML IJ SOLN
4.0000 mg | Freq: Four times a day (QID) | INTRAMUSCULAR | Status: DC | PRN
  Administered 2015-04-18 – 2015-04-19 (×2): 4 mg via INTRAVENOUS
  Filled 2015-04-17 (×2): qty 2

## 2015-04-17 MED ORDER — KETOROLAC TROMETHAMINE 30 MG/ML IJ SOLN
INTRAMUSCULAR | Status: DC | PRN
  Administered 2015-04-17: 30 mg via INTRA_ARTICULAR

## 2015-04-17 MED ORDER — PROPOFOL 10 MG/ML IV BOLUS
INTRAVENOUS | Status: AC
  Filled 2015-04-17: qty 20

## 2015-04-17 MED ORDER — METHOCARBAMOL 1000 MG/10ML IJ SOLN
500.0000 mg | Freq: Four times a day (QID) | INTRAVENOUS | Status: DC | PRN
  Administered 2015-04-17: 500 mg via INTRAVENOUS
  Filled 2015-04-17 (×2): qty 5

## 2015-04-17 MED ORDER — DEXAMETHASONE SODIUM PHOSPHATE 10 MG/ML IJ SOLN
10.0000 mg | Freq: Once | INTRAMUSCULAR | Status: AC
  Administered 2015-04-18: 10 mg via INTRAVENOUS
  Filled 2015-04-17: qty 1

## 2015-04-17 MED ORDER — PROMETHAZINE HCL 25 MG/ML IJ SOLN: 6.2500 mg | INTRAMUSCULAR | Status: DC | PRN

## 2015-04-17 MED ORDER — LIDOCAINE HCL (CARDIAC) 20 MG/ML IV SOLN
INTRAVENOUS | Status: DC | PRN
  Administered 2015-04-17: 50 mg via INTRAVENOUS

## 2015-04-17 MED ORDER — ONDANSETRON HCL 4 MG/2ML IJ SOLN
INTRAMUSCULAR | Status: DC | PRN
  Administered 2015-04-17: 4 mg via INTRAVENOUS

## 2015-04-17 MED ORDER — HYDROMORPHONE HCL 1 MG/ML IJ SOLN
0.2500 mg | INTRAMUSCULAR | Status: DC | PRN
  Administered 2015-04-17 (×2): 0.5 mg via INTRAVENOUS

## 2015-04-17 MED ORDER — LABETALOL HCL 5 MG/ML IV SOLN
INTRAVENOUS | Status: DC | PRN
  Administered 2015-04-17: 5 mg via INTRAVENOUS

## 2015-04-17 MED ORDER — DEXAMETHASONE SODIUM PHOSPHATE 10 MG/ML IJ SOLN
INTRAMUSCULAR | Status: AC
  Filled 2015-04-17: qty 1

## 2015-04-17 MED ORDER — FENTANYL CITRATE (PF) 100 MCG/2ML IJ SOLN
INTRAMUSCULAR | Status: AC
  Filled 2015-04-17: qty 4

## 2015-04-17 MED ORDER — HYDROMORPHONE HCL 1 MG/ML IJ SOLN
INTRAMUSCULAR | Status: AC
  Filled 2015-04-17: qty 1

## 2015-04-17 MED ORDER — HYDROMORPHONE HCL 1 MG/ML IJ SOLN
INTRAMUSCULAR | Status: DC | PRN
  Administered 2015-04-17: 0.5 mg via INTRAVENOUS

## 2015-04-17 MED ORDER — MENTHOL 3 MG MT LOZG
1.0000 | LOZENGE | OROMUCOSAL | Status: DC | PRN
  Filled 2015-04-17: qty 9

## 2015-04-17 MED ORDER — CEFAZOLIN SODIUM-DEXTROSE 2-3 GM-% IV SOLR
INTRAVENOUS | Status: AC
  Filled 2015-04-17: qty 50

## 2015-04-17 MED ORDER — GLYCOPYRROLATE 0.2 MG/ML IJ SOLN
INTRAMUSCULAR | Status: DC | PRN
  Administered 2015-04-17: 0.4 mg via INTRAVENOUS

## 2015-04-17 MED ORDER — POLYETHYLENE GLYCOL 3350 17 G PO PACK
17.0000 g | PACK | Freq: Two times a day (BID) | ORAL | Status: DC
  Administered 2015-04-19: 17 g via ORAL

## 2015-04-17 MED ORDER — SIMVASTATIN 40 MG PO TABS
40.0000 mg | ORAL_TABLET | Freq: Every evening | ORAL | Status: DC
Start: 2015-04-17 — End: 2015-04-19
  Administered 2015-04-17 – 2015-04-18 (×2): 40 mg via ORAL
  Filled 2015-04-17 (×3): qty 1

## 2015-04-17 MED ORDER — MAGNESIUM CITRATE PO SOLN: 1.0000 | Freq: Once | ORAL | Status: DC | PRN

## 2015-04-17 MED ORDER — LACTATED RINGERS IV SOLN: INTRAVENOUS | Status: DC

## 2015-04-17 MED ORDER — DIPHENHYDRAMINE HCL 25 MG PO CAPS: 25.0000 mg | ORAL_CAPSULE | Freq: Four times a day (QID) | ORAL | Status: DC | PRN

## 2015-04-17 MED ORDER — OMEPRAZOLE 20 MG PO CPDR
40.0000 mg | DELAYED_RELEASE_CAPSULE | Freq: Every day | ORAL | Status: DC
  Administered 2015-04-18 – 2015-04-19 (×2): 40 mg via ORAL
  Filled 2015-04-17 (×3): qty 2

## 2015-04-17 MED ORDER — SODIUM CHLORIDE 0.9 % IJ SOLN
INTRAMUSCULAR | Status: DC | PRN
  Administered 2015-04-17: 30 mL

## 2015-04-17 MED ORDER — CEFAZOLIN SODIUM-DEXTROSE 2-3 GM-% IV SOLR
2.0000 g | Freq: Four times a day (QID) | INTRAVENOUS | Status: AC
  Administered 2015-04-17 – 2015-04-18 (×2): 2 g via INTRAVENOUS
  Filled 2015-04-17 (×2): qty 50

## 2015-04-17 MED ORDER — CISATRACURIUM BESYLATE 20 MG/10ML IV SOLN
INTRAVENOUS | Status: AC
  Filled 2015-04-17: qty 10

## 2015-04-17 MED ORDER — LACTATED RINGERS IV SOLN
INTRAVENOUS | Status: DC
  Administered 2015-04-17: 1000 mL via INTRAVENOUS
  Administered 2015-04-17: 15:00:00 via INTRAVENOUS

## 2015-04-17 MED ORDER — BUPIVACAINE-EPINEPHRINE (PF) 0.25% -1:200000 IJ SOLN
INTRAMUSCULAR | Status: DC | PRN
  Administered 2015-04-17: 30 mL

## 2015-04-17 MED ORDER — GLYCOPYRROLATE 0.2 MG/ML IJ SOLN
INTRAMUSCULAR | Status: AC
  Filled 2015-04-17: qty 2

## 2015-04-17 MED ORDER — TEMAZEPAM 15 MG PO CAPS
30.0000 mg | ORAL_CAPSULE | Freq: Every evening | ORAL | Status: DC | PRN
  Administered 2015-04-18: 30 mg via ORAL
  Filled 2015-04-17: qty 2

## 2015-04-17 MED ORDER — EPHEDRINE SULFATE 50 MG/ML IJ SOLN
INTRAMUSCULAR | Status: DC | PRN
  Administered 2015-04-17: 5 mg via INTRAVENOUS
  Administered 2015-04-17: 10 mg via INTRAVENOUS
  Administered 2015-04-17: 5 mg via INTRAVENOUS

## 2015-04-17 MED ORDER — FERROUS SULFATE 325 (65 FE) MG PO TABS
325.0000 mg | ORAL_TABLET | Freq: Three times a day (TID) | ORAL | Status: DC
  Administered 2015-04-18: 325 mg via ORAL
  Filled 2015-04-17 (×8): qty 1

## 2015-04-17 MED ORDER — MEPERIDINE HCL 50 MG/ML IJ SOLN: 6.2500 mg | INTRAMUSCULAR | Status: DC | PRN

## 2015-04-17 MED ORDER — METHOCARBAMOL 500 MG PO TABS
500.0000 mg | ORAL_TABLET | Freq: Four times a day (QID) | ORAL | Status: DC | PRN
  Administered 2015-04-17 – 2015-04-18 (×3): 500 mg via ORAL
  Filled 2015-04-17 (×3): qty 1

## 2015-04-17 MED ORDER — DEXAMETHASONE SODIUM PHOSPHATE 10 MG/ML IJ SOLN
10.0000 mg | Freq: Once | INTRAMUSCULAR | Status: AC
  Administered 2015-04-17: 10 mg via INTRAVENOUS

## 2015-04-17 MED ORDER — METOCLOPRAMIDE HCL 5 MG/ML IJ SOLN: 5.0000 mg | Freq: Three times a day (TID) | INTRAMUSCULAR | Status: DC | PRN

## 2015-04-17 MED ORDER — DOCUSATE SODIUM 100 MG PO CAPS
100.0000 mg | ORAL_CAPSULE | Freq: Two times a day (BID) | ORAL | Status: DC
  Administered 2015-04-17 – 2015-04-19 (×4): 100 mg via ORAL

## 2015-04-17 MED ORDER — HYDROCODONE-ACETAMINOPHEN 7.5-325 MG PO TABS
1.0000 | ORAL_TABLET | ORAL | Status: DC
  Administered 2015-04-17: 1 via ORAL
  Administered 2015-04-17 – 2015-04-19 (×9): 2 via ORAL
  Filled 2015-04-17 (×2): qty 2
  Filled 2015-04-17: qty 1
  Filled 2015-04-17 (×3): qty 2
  Filled 2015-04-17: qty 1
  Filled 2015-04-17 (×4): qty 2

## 2015-04-17 MED ORDER — ONDANSETRON HCL 4 MG/2ML IJ SOLN
INTRAMUSCULAR | Status: AC
  Filled 2015-04-17: qty 2

## 2015-04-17 MED ORDER — SODIUM CHLORIDE 0.9 % IJ SOLN
INTRAMUSCULAR | Status: AC
  Filled 2015-04-17: qty 50

## 2015-04-17 MED ORDER — SUFENTANIL CITRATE 50 MCG/ML IV SOLN
INTRAVENOUS | Status: AC
  Filled 2015-04-17: qty 1

## 2015-04-17 MED ORDER — CELECOXIB 200 MG PO CAPS
200.0000 mg | ORAL_CAPSULE | Freq: Two times a day (BID) | ORAL | Status: DC
  Administered 2015-04-17 – 2015-04-18 (×3): 200 mg via ORAL
  Filled 2015-04-17 (×6): qty 1

## 2015-04-17 MED ORDER — CISATRACURIUM BESYLATE (PF) 10 MG/5ML IV SOLN
INTRAVENOUS | Status: DC | PRN
  Administered 2015-04-17: 5 mg via INTRAVENOUS

## 2015-04-17 MED ORDER — LEVOTHYROXINE SODIUM 112 MCG PO TABS
112.0000 ug | ORAL_TABLET | Freq: Every day | ORAL | Status: DC
  Administered 2015-04-18 – 2015-04-19 (×2): 112 ug via ORAL
  Filled 2015-04-17 (×4): qty 1

## 2015-04-17 MED ORDER — HYDROMORPHONE HCL 2 MG/ML IJ SOLN
INTRAMUSCULAR | Status: AC
  Filled 2015-04-17: qty 1

## 2015-04-17 MED ORDER — SUFENTANIL CITRATE 50 MCG/ML IV SOLN
INTRAVENOUS | Status: DC | PRN
  Administered 2015-04-17: 15 ug via INTRAVENOUS
  Administered 2015-04-17 (×2): 10 ug via INTRAVENOUS
  Administered 2015-04-17: 5 ug via INTRAVENOUS
  Administered 2015-04-17: 10 ug via INTRAVENOUS

## 2015-04-17 MED ORDER — ONDANSETRON HCL 4 MG PO TABS: 4.0000 mg | ORAL_TABLET | Freq: Four times a day (QID) | ORAL | Status: DC | PRN

## 2015-04-17 MED ORDER — 0.9 % SODIUM CHLORIDE (POUR BTL) OPTIME
TOPICAL | Status: DC | PRN
  Administered 2015-04-17: 1000 mL

## 2015-04-17 SURGICAL SUPPLY — 61 items
BAG DECANTER FOR FLEXI CONT (MISCELLANEOUS) IMPLANT
BAG ZIPLOCK 12X15 (MISCELLANEOUS) ×2 IMPLANT
BANDAGE ELASTIC 6 VELCRO ST LF (GAUZE/BANDAGES/DRESSINGS) ×2 IMPLANT
BANDAGE ESMARK 6X9 LF (GAUZE/BANDAGES/DRESSINGS) ×1 IMPLANT
BLADE SAW SGTL 13.0X1.19X90.0M (BLADE) ×2 IMPLANT
BNDG ESMARK 6X9 LF (GAUZE/BANDAGES/DRESSINGS) ×2
BONE CEMENT GENTAMICIN (Cement) ×4 IMPLANT
BOWL SMART MIX CTS (DISPOSABLE) ×2 IMPLANT
CAPT KNEE TOTAL 3 ATTUNE ×2 IMPLANT
CEMENT BONE GENTAMICIN 40 (Cement) ×2 IMPLANT
CHLORAPREP W/TINT 26ML (MISCELLANEOUS) IMPLANT
CUFF TOURN SGL QUICK 34 (TOURNIQUET CUFF) ×1
CUFF TRNQT CYL 34X4X40X1 (TOURNIQUET CUFF) ×1 IMPLANT
DECANTER SPIKE VIAL GLASS SM (MISCELLANEOUS) ×2 IMPLANT
DRAPE EXTREMITY T 121X128X90 (DRAPE) ×2 IMPLANT
DRAPE POUCH INSTRU U-SHP 10X18 (DRAPES) ×2 IMPLANT
DRAPE U-SHAPE 47X51 STRL (DRAPES) ×2 IMPLANT
DRSG AQUACEL AG ADV 3.5X10 (GAUZE/BANDAGES/DRESSINGS) ×2 IMPLANT
DURAPREP 26ML APPLICATOR (WOUND CARE) ×4 IMPLANT
ELECT REM PT RETURN 9FT ADLT (ELECTROSURGICAL) ×2
ELECTRODE REM PT RTRN 9FT ADLT (ELECTROSURGICAL) ×1 IMPLANT
FACESHIELD WRAPAROUND (MASK) ×10 IMPLANT
GLOVE BIO SURGEON STRL SZ7.5 (GLOVE) ×2 IMPLANT
GLOVE BIOGEL PI IND STRL 7.0 (GLOVE) ×2 IMPLANT
GLOVE BIOGEL PI IND STRL 7.5 (GLOVE) ×2 IMPLANT
GLOVE BIOGEL PI IND STRL 8.5 (GLOVE) ×1 IMPLANT
GLOVE BIOGEL PI INDICATOR 7.0 (GLOVE) ×2
GLOVE BIOGEL PI INDICATOR 7.5 (GLOVE) ×2
GLOVE BIOGEL PI INDICATOR 8.5 (GLOVE) ×1
GLOVE ECLIPSE 8.0 STRL XLNG CF (GLOVE) ×2 IMPLANT
GLOVE ORTHO TXT STRL SZ7.5 (GLOVE) ×4 IMPLANT
GLOVE SURG SS PI 7.0 STRL IVOR (GLOVE) ×2 IMPLANT
GOWN SPEC L3 XXLG W/TWL (GOWN DISPOSABLE) ×2 IMPLANT
GOWN STRL REUS W/ TWL XL LVL3 (GOWN DISPOSABLE) ×1 IMPLANT
GOWN STRL REUS W/TWL LRG LVL3 (GOWN DISPOSABLE) ×4 IMPLANT
GOWN STRL REUS W/TWL XL LVL3 (GOWN DISPOSABLE) ×1
HANDPIECE INTERPULSE COAX TIP (DISPOSABLE) ×1
KIT BASIN OR (CUSTOM PROCEDURE TRAY) ×2 IMPLANT
LIQUID BAND (GAUZE/BANDAGES/DRESSINGS) ×2 IMPLANT
MANIFOLD NEPTUNE II (INSTRUMENTS) ×2 IMPLANT
NDL SAFETY ECLIPSE 18X1.5 (NEEDLE) ×1 IMPLANT
NEEDLE HYPO 18GX1.5 SHARP (NEEDLE) ×1
PACK TOTAL JOINT (CUSTOM PROCEDURE TRAY) ×2 IMPLANT
PEN SKIN MARKING BROAD (MISCELLANEOUS) ×2 IMPLANT
POSITIONER SURGICAL ARM (MISCELLANEOUS) ×2 IMPLANT
SET HNDPC FAN SPRY TIP SCT (DISPOSABLE) ×1 IMPLANT
SET PAD KNEE POSITIONER (MISCELLANEOUS) ×2 IMPLANT
SUCTION FRAZIER 12FR DISP (SUCTIONS) ×2 IMPLANT
SUT MNCRL AB 4-0 PS2 18 (SUTURE) ×2 IMPLANT
SUT VIC AB 1 CT1 36 (SUTURE) ×2 IMPLANT
SUT VIC AB 2-0 CT1 27 (SUTURE) ×3
SUT VIC AB 2-0 CT1 TAPERPNT 27 (SUTURE) ×3 IMPLANT
SUT VLOC 180 0 24IN GS25 (SUTURE) ×2 IMPLANT
SYR 50ML LL SCALE MARK (SYRINGE) ×2 IMPLANT
TOWEL OR 17X26 10 PK STRL BLUE (TOWEL DISPOSABLE) ×2 IMPLANT
TOWEL OR NON WOVEN STRL DISP B (DISPOSABLE) ×2 IMPLANT
TRAY FOLEY W/METER SILVER 14FR (SET/KITS/TRAYS/PACK) ×2 IMPLANT
TRAY FOLEY W/METER SILVER 16FR (SET/KITS/TRAYS/PACK) IMPLANT
WATER STERILE IRR 1500ML POUR (IV SOLUTION) ×2 IMPLANT
WRAP KNEE MAXI GEL POST OP (GAUZE/BANDAGES/DRESSINGS) ×2 IMPLANT
YANKAUER SUCT BULB TIP 10FT TU (MISCELLANEOUS) ×2 IMPLANT

## 2015-04-17 NOTE — Op Note (Signed)
NAME:  Kimberly Cook                      MEDICAL RECORD NO.:  [id number]                             FACILITY:  [facility]      PHYSICIAN:  Madlyn Frankel. Charlann Boxer, M.D.  DATE OF BIRTH:  08-24-1945      DATE OF PROCEDURE:  04/17/2015                                     OPERATIVE REPORT         PREOPERATIVE DIAGNOSIS:  Right knee osteoarthritis.      POSTOPERATIVE DIAGNOSIS:  Right knee osteoarthritis.      FINDINGS:  The patient was noted to have complete loss of cartilage and   bone-on-bone arthritis with associated osteophytes in the lateral and patellofemoral compartments of   the knee with a significant synovitis and associated effusion.      PROCEDURE:  Left total knee replacement.      COMPONENTS USED:  DePuy Attune rotating platform posterior stabilized knee   system, a size 5N femur, 6 tibia, size 7 mm AOX PS insert, and 38 anatomic patellar   button.      SURGEON:  Madlyn Frankel. Charlann Boxer, M.D.      ASSISTANT:  Lanney Gins, PA-C.      ANESTHESIA:  General.      SPECIMENS:  None.      COMPLICATION:  None.      DRAINS:  One Hemovac.  EBL: <50cc      TOURNIQUET TIME:   Total Tourniquet Time Documented: Thigh (Left) - 26 minutes Total: Thigh (Left) - 26 minutes  .      The patient was stable to the recovery room.      INDICATION FOR PROCEDURE:  Kimberly Cook is a 69 y.o. female patient of   mine.  The patient had been seen, evaluated, and treated conservatively in the   office with medication, activity modification, and injections.  The patient had   radiographic changes of bone-on-bone arthritis with endplate sclerosis and osteophytes noted.      The patient failed conservative measures including medication, injections, and activity modification, and at this point was ready for more definitive measures.   Based on the radiographic changes and failed conservative measures, the patient   decided to proceed with total knee replacement.  Risks of infection,   DVT,  component failure, need for revision surgery, postop course, and   expectations were all   discussed and reviewed.  Consent was obtained for benefit of pain   relief.      PROCEDURE IN DETAIL:  The patient was brought to the operative theater.   Once adequate anesthesia, preoperative antibiotics, 2 gm of Ancef, 1 gm of Tranexamic Acid, and 10 mg of Decadron administered, the patient was positioned supine with the left thigh tourniquet placed.  The  left lower extremity was prepped and draped in sterile fashion.  A time-   out was performed identifying the patient, planned procedure, and   extremity.      The left lower extremity was placed in the [facility] leg holder.  The leg was   exsanguinated, tourniquet elevated to 250 mmHg.  A midline incision was  made followed by median parapatellar arthrotomy.  Following initial   exposure, attention was first directed to the patella.  Precut   measurement was noted to be 26 mm.  I resected down to 14-15 mm and used a   38 patellar button to restore patellar height as well as cover the cut   surface.      The lug holes were drilled and a metal shim was placed to protect the   patella from retractors and saw blades.      At this point, attention was now directed to the femur.  The femoral   canal was opened with a drill, irrigated to try to prevent fat emboli.  An   intramedullary rod was passed at 3 degrees valgus, 9 mm of bone was   resected off the distal femur.  Following this resection, the tibia was   subluxated anteriorly.  Using the extramedullary guide, 2 mm of bone was resected off   the proximal lateral tibia.  We confirmed the gap would be   stable medially and laterally with a size 5 mm insert as well as confirmed   the cut was perpendicular in the coronal plane, checking with an alignment rod.      Once this was done, I sized the femur to be a size 5 in the anterior-   posterior dimension, chose a narrow component based on medial  and   lateral dimension.  The size 5 rotation block was then pinned in   position anterior referenced using the C-clamp to set rotation.  The   anterior, posterior, and  chamfer cuts were made without difficulty nor   notching making certain that I was along the anterior cortex to help   with flexion gap stability.      The final box cut was made off the lateral aspect of distal femur.      At this point, the tibia was sized to be a size 6, the size 6 tray was   then pinned in position through the medial third of the tubercle,   drilled, and keel punched.  Trial reduction was now carried with a 5 femur,  6 tibia, a size 6 then 7 mm PS insert, and the 38 patella botton.  The knee was brought to   extension, full extension with good flexion stability with the patella   tracking through the trochlea without application of pressure.  Given   all these findings, the trial components removed.  Final components were   opened and cement was mixed.  The knee was irrigated with normal saline   solution and pulse lavage.  The synovial lining was   then injected with 30 cc of 0.25% Marcaine with epinephrine and 1 cc of Toradol plus 30 cc for a   total of 61 cc.      The knee was irrigated.  Final implants were then cemented onto clean and   dried cut surfaces of bone with the knee brought to extension with a size 7   mm trial insert.      Once the cement had fully cured, the excess cement was removed   throughout the knee.  I confirmed I was satisfied with the range of   motion and stability, and the final size 7 mm PS AOX insert was chosen.  It was   placed into the knee.      The tourniquet had been let down at 26 minutes.  No significant  hemostasis required.  The   extensor mechanism was then reapproximated using #1 Vicryl and # 0 V-lock sutures with the knee   in flexion.  The   remaining wound was closed with 2-0 Vicryl and running 4-0 Monocryl.   The knee was cleaned, dried, dressed  sterilely using Dermabond and   Aquacel dressing.  The patient was then   brought to recovery room in stable condition, tolerating the procedure   well.   Please note that Physician Assistant, Lanney Gins, PA-C, was present for the entirety of the case, and was utilized for pre-operative positioning, peri-operative retractor management, general facilitation of the procedure.  He was also utilized for primary wound closure at the end of the case.              Madlyn Frankel Charlann Boxer, M.D.    04/17/2015 3:08 PM

## 2015-04-17 NOTE — Progress Notes (Signed)
Scheduled procedure is on Medicare IP only list.  Will need order for INPATIENT post-op. Thanks you  

## 2015-04-17 NOTE — Anesthesia Procedure Notes (Signed)
Procedure Name: Intubation Date/Time: 04/17/2015 1:52 PM Performed by: Leroy Libman L Patient Re-evaluated:Patient Re-evaluated prior to inductionOxygen Delivery Method: Circle system utilized Preoxygenation: Pre-oxygenation with 100% oxygen Intubation Type: IV induction Ventilation: Mask ventilation without difficulty and Oral airway inserted - appropriate to patient size Laryngoscope Size: Hyacinth Meeker and 2 Grade View: Grade I Tube type: Oral Tube size: 7.5 mm Number of attempts: 1 Airway Equipment and Method: Stylet Placement Confirmation: ETT inserted through vocal cords under direct vision,  breath sounds checked- equal and bilateral and positive ETCO2 Secured at: 21 cm Tube secured with: Tape Dental Injury: Teeth and Oropharynx as per pre-operative assessment

## 2015-04-17 NOTE — Interval H&P Note (Signed)
History and Physical Interval Note:  04/17/2015 1:11 PM  Kimberly Cook  has presented today for surgery, with the diagnosis of LEFT KNEE OA  The various methods of treatment have been discussed with the patient and family. After consideration of risks, benefits and other options for treatment, the patient has consented to  Procedure(s): LEFT TOTAL KNEE ARTHROPLASTY (Left) as a surgical intervention .  The patient's history has been reviewed, patient examined, no change in status, stable for surgery.  I have reviewed the patient's chart and labs.  Questions were answered to the patient's satisfaction.     Shelda Pal

## 2015-04-17 NOTE — Transfer of Care (Signed)
Immediate Anesthesia Transfer of Care Note  Patient: Kimberly Cook  Procedure(s) Performed: Procedure(s): LEFT TOTAL KNEE ARTHROPLASTY (Left)  Patient Location: PACU  Anesthesia Type:General  Level of Consciousness: awake, alert  and oriented  Airway & Oxygen Therapy: Patient Spontanous Breathing and Patient connected to face mask oxygen  Post-op Assessment: Report given to RN and Post -op Vital signs reviewed and stable  Post vital signs: Reviewed and stable  Last Vitals:  Filed Vitals:   04/17/15 1047  BP: 163/80  Pulse: 85  Temp: 36.7 C  Resp: 18    Complications: No apparent anesthesia complications

## 2015-04-17 NOTE — Anesthesia Preprocedure Evaluation (Addendum)
Anesthesia Evaluation  Patient identified by MRN, date of birth, ID band Patient awake    Reviewed: Allergy & Precautions, NPO status , Patient's Chart, lab work & pertinent test results  Airway Mallampati: I  TM Distance: >3 FB Neck ROM: Full    Dental  (+) Lower Dentures, Upper Dentures   Pulmonary neg pulmonary ROS,    breath sounds clear to auscultation       Cardiovascular hypertension, Pt. on medications  Rhythm:Regular Rate:Normal     Neuro/Psych PSYCHIATRIC DISORDERS Depression negative neurological ROS     GI/Hepatic Neg liver ROS, GERD  Medicated,  Endo/Other  Hypothyroidism   Renal/GU negative Renal ROS  negative genitourinary   Musculoskeletal  (+) Arthritis ,   Abdominal   Peds negative pediatric ROS (+)  Hematology negative hematology ROS (+)   Anesthesia Other Findings   Reproductive/Obstetrics                            Lab Results  Component Value Date   WBC 5.3 04/06/2015   HGB 13.1 04/06/2015   HCT 38.7 04/06/2015   MCV 91.9 04/06/2015   PLT 176 04/06/2015   Lab Results  Component Value Date   CREATININE 0.90 04/06/2015   BUN 21* 04/06/2015   NA 138 04/06/2015   K 4.3 04/06/2015   CL 105 04/06/2015   CO2 27 04/06/2015   Lab Results  Component Value Date   INR 1.06 04/06/2015   INR 1.01 12/26/2009    Anesthesia Physical Anesthesia Plan  ASA: II  Anesthesia Plan: General   Post-op Pain Management:    Induction: Intravenous  Airway Management Planned:   Additional Equipment:   Intra-op Plan:   Post-operative Plan:   Informed Consent: I have reviewed the patients History and Physical, chart, labs and discussed the procedure including the risks, benefits and alternatives for the proposed anesthesia with the patient or authorized representative who has indicated his/her understanding and acceptance.   Dental advisory given  Plan Discussed  with: CRNA  Anesthesia Plan Comments:        Anesthesia Quick Evaluation

## 2015-04-18 ENCOUNTER — Encounter (HOSPITAL_COMMUNITY): Payer: Self-pay | Admitting: Orthopedic Surgery

## 2015-04-18 LAB — BASIC METABOLIC PANEL
Anion gap: 9 (ref 5–15)
BUN: 19 mg/dL (ref 6–20)
CO2: 22 mmol/L (ref 22–32)
Calcium: 8.9 mg/dL (ref 8.9–10.3)
Chloride: 103 mmol/L (ref 101–111)
Creatinine, Ser: 0.94 mg/dL (ref 0.44–1.00)
GFR calc Af Amer: >60 mL/min (ref >60)
GFR calc non Af Amer: >60 mL/min (ref >60)
Glucose, Bld: 141 mg/dL — ABNORMAL HIGH (ref 65–99)
Potassium: 4.6 mmol/L (ref 3.5–5.1)
Sodium: 134 mmol/L — ABNORMAL LOW (ref 135–145)

## 2015-04-18 LAB — CBC
HCT: 32.9 % — ABNORMAL LOW (ref 36.0–46.0)
Hemoglobin: 11.5 g/dL — ABNORMAL LOW (ref 12.0–15.0)
MCH: 31.3 pg (ref 26.0–34.0)
MCHC: 35 g/dL (ref 30.0–36.0)
MCV: 89.4 fL (ref 78.0–100.0)
Platelets: 156 10*3/uL (ref 150–400)
RBC: 3.68 MIL/uL — ABNORMAL LOW (ref 3.87–5.11)
RDW: 12 % (ref 11.5–15.5)
WBC: 8.6 10*3/uL (ref 4.0–10.5)

## 2015-04-18 NOTE — Clinical Documentation Improvement (Signed)
Orthopedic  Please clarify the conflicting documentation regarding laterality. H&P notes chief complaint as "left knee primary OA / pain".  Op note states preop diagnosis as "right knee osteoarthritis" with postop diagnosis as" right knee osteoarthritis" but procedure listed as "left total knee replacement"; body of operative note also describes left involved.      Please exercise your independent, professional judgment when responding. A specific answer is not anticipated or expected.   Thank you, Doy Mince, RN [phone] Clinical Documentation Specialist

## 2015-04-18 NOTE — Clinical Social Work Placement (Signed)
   CLINICAL SOCIAL WORK PLACEMENT  NOTE  Date:  04/18/2015  Patient Details  Name: Kimberly Cook MRN: [id number] Date of Birth: 12/30/1945  Clinical Social Work is seeking post-discharge placement for this patient at the Skilled  Nursing Facility level of care (*CSW will initial, date and re-position this form in  chart as items are completed):  No   Patient/family provided with [facility] Clinical Social Work Department's list of facilities offering this level of care within the geographic area requested by the patient (or if unable, by the patient's family).  Yes   Patient/family informed of their freedom to choose among providers that offer the needed level of care, that participate in Medicare, Medicaid or managed care program needed by the patient, have an available bed and are willing to accept the patient.  No   Patient/family informed of [facility]'s ownership interest in [facility] and [facility], as well as of the fact that they are under no obligation to receive care at these facilities.  PASRR submitted to EDS on 04/17/15     PASRR number received on 04/17/15     Existing PASRR number confirmed on       FL2 transmitted to all facilities in geographic area requested by pt/family on 04/18/15     FL2 transmitted to all facilities within larger geographic area on       Patient informed that his/her managed care company has contracts with or will negotiate with certain facilities, including the following:        Yes   Patient/family informed of bed offers received.  Patient chooses bed at [facility]     Physician recommends and patient chooses bed at      Patient to be transferred to [facility] on  .  Patient to be transferred to facility by       Patient family notified on   of transfer.  Name of family member notified:        PHYSICIAN       Additional Comment:    _______________________________________________ Royetta Asal, LCSW   [phone] 04/18/2015, 5:08 PM

## 2015-04-18 NOTE — Progress Notes (Signed)
Physical Therapy Treatment Patient Details Name: Kimberly Cook MRN: [id number] DOB: 08/12/1945 Today's Date: 02-May-2015    History of Present Illness L TKR    PT Comments      Follow Up Recommendations  SNF     Equipment Recommendations  None recommended by PT    Recommendations for Other Services OT consult     Precautions / Restrictions Precautions Precautions: Knee;Fall Restrictions Weight Bearing Restrictions: No Other Position/Activity Restrictions: WBAT    Mobility  Bed Mobility Overal bed mobility: Needs Assistance Bed Mobility: Supine to Sit;Sit to Supine     Supine to sit: Min assist Sit to supine: Min assist   General bed mobility comments: assist for LLE  Transfers Overall transfer level: Needs assistance Equipment used: Rolling walker (2 wheeled) Transfers: Sit to/from Stand Sit to Stand: Min assist;From elevated surface         General transfer comment: cues for UE/LE placement  Ambulation/Gait Ambulation/Gait assistance: Min assist Ambulation Distance (Feet): 84 Feet Assistive device: Rolling walker (2 wheeled) Gait Pattern/deviations: Step-to pattern;Decreased step length - right;Decreased step length - left;Shuffle;Trunk flexed     General Gait Details: cues for sequence, posture and position from [employer]            Wheelchair Mobility    Modified Rankin (Stroke Patients Only)       Balance                                    Cognition Arousal/Alertness: Awake/alert Behavior During Therapy: WFL for tasks assessed/performed Overall Cognitive Status: Within Functional Limits for tasks assessed                      Exercises Total Joint Exercises Ankle Circles/Pumps: AROM;Both;15 reps;Supine Quad Sets: AROM;Both;10 reps;Supine Heel Slides: AAROM;Left;15 reps;Supine Straight Leg Raises: AAROM;AROM;Left;15 reps;Supine    General Comments        Pertinent Vitals/Pain Pain  Assessment: 0-10 Pain Score: 5  Pain Location: L knee Pain Descriptors / Indicators: Aching;Sore Pain Intervention(s): Limited activity within patient's tolerance;Monitored during session;Premedicated before session;Ice applied    Home Living                      Prior Function            PT Goals (current goals can now be found in the care plan section) Acute Rehab PT Goals Patient Stated Goal: Resume previous lifestyle with decreased pain PT Goal Formulation: With patient Time For Goal Achievement: 04/21/15 Potential to Achieve Goals: Good Progress towards PT goals: Progressing toward goals    Frequency  7X/week    PT Plan Current plan remains appropriate    Co-evaluation             End of Session Equipment Utilized During Treatment: Gait belt Activity Tolerance: Patient tolerated treatment well Patient left: in bed;with call bell/phone within reach     Time: 1349-1425 PT Time Calculation (min) (ACUTE ONLY): 36 min  Charges:  $Gait Training: 8-22 mins $Therapeutic Exercise: 8-22 mins                    G Codes:      Antoinetta Berrones May 02, 2015, 4:32 PM

## 2015-04-18 NOTE — Progress Notes (Signed)
Patient ID: Kimberly Cook, female   DOB: 04-Jul-1946, 69 y.o.   MRN: [id number] Subjective: 1 Day Post-Op Procedure(s) (LRB): LEFT TOTAL KNEE ARTHROPLASTY (Left)    Patient reports pain as moderate.  Not much sleep last night.    Objective:   VITALS:   Filed Vitals:   04/18/15 0500  BP: 119/81  Pulse: 72  Temp: 97.9 F (36.6 C)  Resp: 16    Neurovascular intact - moving left lower leg well Incision: dressing C/D/I  LABS  Recent Labs  04/18/15 0430  HGB 11.5*  HCT 32.9*  WBC 8.6  PLT 156     Recent Labs  04/18/15 0430  NA 134*  K 4.6  BUN 19  CREATININE 0.94  GLUCOSE 141*    No results for input(s): LABPT, INR in the last 72 hours.   Assessment/Plan: 1 Day Post-Op Procedure(s) (LRB): LEFT TOTAL KNEE ARTHROPLASTY (Left)   Advance diet Up with therapy Discharge to SNF - wants to go to [facility] for initial rehab (5-7 days)  Labs/medically stable

## 2015-04-18 NOTE — Evaluation (Signed)
Occupational Therapy Evaluation Patient Details Name: Kimberly Cook MRN: [id number] DOB: Dec 13, 1945 Today's Date: 04/18/2015    History of Present Illness L TKR   Clinical Impression   This 69 year old female was admitted for the above surgery.  She was independent with adls prior to admission and needs min to mod A at this time.  Goals in acute are for supervision to min A    Follow Up Recommendations  SNF    Equipment Recommendations   (pt wants elevated toilet seat)    Recommendations for Other Services       Precautions / Restrictions Precautions Precautions: Knee;Fall Restrictions Other Position/Activity Restrictions: WBAT      Mobility Bed Mobility           Sit to supine: Min assist   General bed mobility comments: assist for LLE  Transfers   Equipment used: Rolling walker (2 wheeled) Transfers: Sit to/from Stand Sit to Stand: Min assist;From elevated surface         General transfer comment: cues for UE/LE placement    Balance                                            ADL Overall ADL's : Needs assistance/impaired     Grooming: Wash/dry hands;Min guard;Standing   Upper Body Bathing: Set up;Sitting   Lower Body Bathing: Minimal assistance;Sit to/from stand   Upper Body Dressing : Set up;Sitting   Lower Body Dressing: Moderate assistance;Sit to/from stand   Toilet Transfer: Minimal assistance;Ambulation;Comfort height toilet;BSC;Grab bars   Toileting- Clothing Manipulation and Hygiene: set up/supervision; leaning on commode          General ADL Comments: Pt was in bathroom with NT.  Cues for safety during turn.  Sat to brush teeth as she wasn't sure she would be able to do this standing. She was able to stand to wash hands.  Educated on [employer] precautions     Vision     Perception     Praxis      Pertinent Vitals/Pain Pain Score: 6  Pain Location: L knee Pain Descriptors / Indicators: Aching Pain  Intervention(s): Limited activity within patient's tolerance;Monitored during session;Premedicated before session;Repositioned     Hand Dominance     Extremity/Trunk Assessment Upper Extremity Assessment Upper Extremity Assessment: Overall WFL for tasks assessed           Communication Communication Communication: No difficulties   Cognition Arousal/Alertness: Awake/alert Behavior During Therapy: WFL for tasks assessed/performed Overall Cognitive Status: Within Functional Limits for tasks assessed                     General Comments       Exercises       Shoulder Instructions      Home Living Family/patient expects to be discharged to:: Skilled nursing facility Living Arrangements: Alone                 Bathroom Shower/Tub: Producer, television/film/video: Standard         Additional Comments: pt states she is interested in elevated toilet seat, instead of 3;1      Prior Functioning/Environment Level of Independence: Independent             OT Diagnosis: Generalized weakness   OT Problem List: Decreased strength;Decreased activity tolerance;Decreased knowledge of use of  DME or AE;Pain   OT Treatment/Interventions: Self-care/ADL training;DME and/or AE instruction;Patient/family education    OT Goals(Current goals can be found in the care plan section) Acute Rehab OT Goals Patient Stated Goal: Resume previous lifestyle with decreased pain OT Goal Formulation: With patient Time For Goal Achievement: 04/25/15 Potential to Achieve Goals: Good  OT Frequency: Min 2X/week   Barriers to D/C:            Co-evaluation              End of Session    Activity Tolerance: Patient tolerated treatment well Patient left: in bed;with call bell/phone within reach   Time: 1220-1239 OT Time Calculation (min): 19 min Charges:  OT General Charges $OT Visit: 1 Procedure OT Evaluation $Initial OT Evaluation Tier I: 1 Procedure G-Codes:     Michele Judy April 23, 2015, 12:44 PM  Marica Otter, OTR/L [phone] 04/23/15

## 2015-04-18 NOTE — Discharge Instructions (Addendum)
Information on my medicine - XARELTO (Rivaroxaban)  This medication education was reviewed with me or my healthcare representative as part of my discharge preparation.  The pharmacist that spoke with me during my hospital stay was:  Elson Clan, [facility]  Why was Xarelto prescribed for you? Xarelto was prescribed for you to reduce the risk of blood clots forming after orthopedic surgery. The medical term for these abnormal blood clots is venous thromboembolism (VTE).  What do you need to know about xarelto ? Take your Xarelto ONCE DAILY at the same time every day. You may take it either with or without food.  If you have difficulty swallowing the tablet whole, you may crush it and mix in applesauce just prior to taking your dose.  Take Xarelto exactly as prescribed by your doctor and DO NOT stop taking Xarelto without talking to the doctor who prescribed the medication.  Stopping without other VTE prevention medication to take the place of Xarelto may increase your risk of developing a clot.  After discharge, you should have regular check-up appointments with your healthcare provider that is prescribing your Xarelto.    What do you do if you miss a dose? If you miss a dose, take it as soon as you remember on the same day then continue your regularly scheduled once daily regimen the next day. Do not take two doses of Xarelto on the same day.   Important Safety Information A possible side effect of Xarelto is bleeding. You should call your healthcare provider right away if you experience any of the following: ? Bleeding from an injury or your nose that does not stop. ? Unusual colored urine (red or dark brown) or unusual colored stools (red or black). ? Unusual bruising for unknown reasons. ? A serious fall or if you hit your head (even if there is no bleeding).  Some medicines may interact with Xarelto and might increase your risk of bleeding while on Xarelto. To help  avoid this, consult your healthcare provider or pharmacist prior to using any new prescription or non-prescription medications, including herbals, vitamins, non-steroidal anti-inflammatory drugs (NSAIDs) and supplements.  This website has more information on Xarelto: VisitDestination.com.br.   ___________________________________________________________________________________________________________________________________  INSTRUCTIONS AFTER JOINT REPLACEMENT   o Remove items at home which could result in a fall. This includes throw rugs or furniture in walking pathways o ICE to the affected joint every three hours while awake for 30 minutes at a time, for at least the first 3-5 days, and then as needed for pain and swelling.  Continue to use ice for pain and swelling. You may notice swelling that will progress down to the foot and ankle.  This is normal after surgery.  Elevate your leg when you are not up walking on it.   o Continue to use the breathing machine you got in the hospital (incentive spirometer) which will help keep your temperature down.  It is common for your temperature to cycle up and down following surgery, especially at night when you are not up moving around and exerting yourself.  The breathing machine keeps your lungs expanded and your temperature down.   DIET:  As you were doing prior to hospitalization, we recommend a well-balanced diet.  DRESSING / WOUND CARE / SHOWERING  Keep the surgical dressing until follow up.  The dressing is water proof, so you can shower without any extra covering.  IF THE DRESSING FALLS OFF or the wound gets wet inside, change the dressing with  sterile gauze.  Please use good hand washing techniques before changing the dressing.  Do not use any lotions or creams on the incision until instructed by your surgeon.    ACTIVITY  o Increase activity slowly as tolerated, but follow the weight bearing instructions below.   o No driving for 6 weeks or until  further direction given by your physician.  You cannot drive while taking narcotics.  o No lifting or carrying greater than 10 lbs. until further directed by your surgeon. o Avoid periods of inactivity such as sitting longer than an hour when not asleep. This helps prevent blood clots.  o You may return to work once you are authorized by your doctor.     WEIGHT BEARING   Weight bearing as tolerated with assist device (walker, cane, etc) as directed, use it as long as suggested by your surgeon or therapist, typically at least 4-6 weeks.   EXERCISES  Results after joint replacement surgery are often greatly improved when you follow the exercise, range of motion and muscle strengthening exercises prescribed by your doctor. Safety measures are also important to protect the joint from further injury. Any time any of these exercises cause you to have increased pain or swelling, decrease what you are doing until you are comfortable again and then slowly increase them. If you have problems or questions, call your caregiver or physical therapist for advice.   Rehabilitation is important following a joint replacement. After just a few days of immobilization, the muscles of the leg can become weakened and shrink (atrophy).  These exercises are designed to build up the tone and strength of the thigh and leg muscles and to improve motion. Often times heat used for twenty to thirty minutes before working out will loosen up your tissues and help with improving the range of motion but do not use heat for the first two weeks following surgery (sometimes heat can increase post-operative swelling).   These exercises can be done on a training (exercise) mat, on the floor, on a table or on a bed. Use whatever works the best and is most comfortable for you.    Use music or television while you are exercising so that the exercises are a pleasant break in your day. This will make your life better with the exercises acting  as a break in your routine that you can look forward to.   Perform all exercises about fifteen times, three times per day or as directed.  You should exercise both the operative leg and the other leg as well.  Exercises include:    Quad Sets - Tighten up the muscle on the front of the thigh (Quad) and hold for 5-10 seconds.    Straight Leg Raises - With your knee straight (if you were given a brace, keep it on), lift the leg to 60 degrees, hold for 3 seconds, and slowly lower the leg.  Perform this exercise against resistance later as your leg gets stronger.   Leg Slides: Lying on your back, slowly slide your foot toward your buttocks, bending your knee up off the floor (only go as far as is comfortable). Then slowly slide your foot back down until your leg is flat on the floor again.   Angel Wings: Lying on your back spread your legs to the side as far apart as you can without causing discomfort.   Hamstring Strength:  Lying on your back, push your heel against the floor with your leg straight by  tightening up the muscles of your buttocks.  Repeat, but this time bend your knee to a comfortable angle, and push your heel against the floor.  You may put a pillow under the heel to make it more comfortable if necessary.   A rehabilitation program following joint replacement surgery can speed recovery and prevent re-injury in the future due to weakened muscles. Contact your doctor or a physical therapist for more information on knee rehabilitation.    CONSTIPATION  Constipation is defined medically as fewer than three stools per week and severe constipation as less than one stool per week.  Even if you have a regular bowel pattern at home, your normal regimen is likely to be disrupted due to multiple reasons following surgery.  Combination of anesthesia, postoperative narcotics, change in appetite and fluid intake all can affect your bowels.   YOU MUST use at least one of the following options; they  are listed in order of increasing strength to get the job done.  They are all available over the counter, and you may need to use some, POSSIBLY even all of these options:    Drink plenty of fluids (prune juice may be helpful) and high fiber foods Colace 100 mg by mouth twice a day  Senokot for constipation as directed and as needed Dulcolax (bisacodyl), take with full glass of water  Miralax (polyethylene glycol) once or twice a day as needed.  If you have tried all these things and are unable to have a bowel movement in the first 3-4 days after surgery call either your surgeon or your primary doctor.    If you experience loose stools or diarrhea, hold the medications until you stool forms back up.  If your symptoms do not get better within 1 week or if they get worse, check with your doctor.  If you experience "the worst abdominal pain ever" or develop nausea or vomiting, please contact the office immediately for further recommendations for treatment.   ITCHING:  If you experience itching with your medications, try taking only a single pain pill, or even half a pain pill at a time.  You can also use Benadryl over the counter for itching or also to help with sleep.   TED HOSE STOCKINGS:  Use stockings on both legs until for at least 2 weeks or as directed by physician office. They may be removed at night for sleeping.  MEDICATIONS:  See your medication summary on the After Visit Summary that nursing will review with you.  You may have some home medications which will be placed on hold until you complete the course of blood thinner medication.  It is important for you to complete the blood thinner medication as prescribed.  PRECAUTIONS:  If you experience chest pain or shortness of breath - call 911 immediately for transfer to the hospital emergency department.   If you develop a fever greater that 101 F, purulent drainage from wound, increased redness or drainage from wound, foul odor from the  wound/dressing, or calf pain - CONTACT YOUR SURGEON.                                                   FOLLOW-UP APPOINTMENTS:  If you do not already have a post-op appointment, please call the office for an appointment to be seen by your surgeon.  Guidelines for how soon to be seen are listed in your “After Visit Summary”, but are typically between 1-4 weeks after surgery. ° °OTHER INSTRUCTIONS:  ° °Knee Replacement:  Do not place pillow under knee, focus on keeping the knee straight while resting.  ° °MAKE SURE YOU:  °• Understand these instructions.  °• Get help right away if you are not doing well or get worse.  ° ° °Thank you for letting us be a part of your medical care team.  It is a privilege we respect greatly.  We hope these instructions will help you stay on track for a fast and full recovery!  °  °

## 2015-04-18 NOTE — Clinical Social Work Note (Signed)
Clinical Social Work Assessment  Patient Details  Name: Kimberly Cook MRN: [id number] Date of Birth: [birth year]  Date of referral:  04/18/15               Reason for consult:                   Permission sought to share information with:  Chartered certified accountant granted to share information::  Yes, Verbal Permission Granted  Name::        Agency::     Relationship::     Contact Information:     Housing/Transportation Living arrangements for the past 2 months:  Single Family Home Source of Information:  Patient Patient Interpreter Needed:  None Criminal Activity/Legal Involvement Pertinent to Current Situation/Hospitalization:  No - Comment as needed Significant Relationships:  Adult Children Lives with:  Self Do you feel safe going back to the place where you live?   (ST Rehab is needed.) Need for family participation in patient care:  No (Coment)  Care giving concerns: Pt's care cannot be managed at home following hospital d/c.   Social Worker assessment / plan:  Pt hospitalized on 04/17/15 for pre planned left total knee arthroplasty. CSW met with pt to assist with d/c planning. PT has recommended ST Rehab at d/c. Pt has made prior arrangements to have ST Rehab at [facility]. CSW has contacted SNF and d/c plan has been confirmed, pending insurance approval. CSW has contacted [employer] medicare to request prior authorization for SNF. A decision is pending.  Employment status:  Retired Nurse, adult PT Recommendations:  [place] / Referral to community resources:  [place]  Patient/Family's Response to care:  Pt agrees with plan for [employer].   Patient/Family's Understanding of and Emotional Response to Diagnosis, Current Treatment, and Prognosis:  Pt has a good understanding of her medical status and is looking forward to having rehab at [facility].  Emotional Assessment Appearance:   Appears stated age Attitude/Demeanor/Rapport:  Other (cooperative) Affect (typically observed):  Calm, Pleasant Orientation:  Oriented to Self, Oriented to Place, Oriented to  Time, Oriented to Situation Alcohol / Substance use:  Not Applicable Psych involvement (Current and /or in the community):  No (Comment)  Discharge Needs  Concerns to be addressed:  Discharge Planning Concerns Readmission within the last 30 days:  No Current discharge risk:  None Barriers to Discharge:  No Barriers Identified   Loraine Maple  [phone] 04/18/2015, 4:39 PM

## 2015-04-18 NOTE — Care Management Note (Signed)
Case Management Note  Patient Details  Name: CLAUDELL LINQUIST MRN: [id number] Date of Birth: 03-Oct-1945  Subjective/Objective:                   LEFT TOTAL KNEE ARTHROPLASTY (Left) Action/Plan:  Discharge planning Expected Discharge Date:  04/19/15               Expected Discharge Plan:  Skilled Nursing Facility  In-House Referral:     Discharge planning Services  CM Consult  Post Acute Care Choice:    Choice offered to:     DME Arranged:    DME Agency:     HH Arranged:    HH Agency:     Status of Service:  Completed, signed off  Medicare Important Message Given:    Date Medicare IM Given:    Medicare IM give by:    Date Additional Medicare IM Given:    Additional Medicare Important Message give by:     If discussed at Long Length of Stay Meetings, dates discussed:    Additional Comments: CM notes pt to go to SNF for rehab; CSW arranging.  No other CM needs were communicated. Yves Dill, RN 04/18/2015, 11:03 AM

## 2015-04-18 NOTE — Evaluation (Signed)
Physical Therapy Evaluation Patient Details Name: Kimberly Cook MRN: [id number] DOB: 1946/04/02 Today's Date: 04/18/2015   History of Present Illness  L TKR  Clinical Impression  Pt s/p L TKR presents with decreased L LE strength/ROM and post op pain limiting functional mobility.  Pt would benefit from follow up rehab at SNF level to maximize IND and safety prior to return home with limited intermittant assist.    Follow Up Recommendations SNF    Equipment Recommendations  None recommended by PT    Recommendations for Other Services OT consult     Precautions / Restrictions Precautions Precautions: Knee;Fall Restrictions Weight Bearing Restrictions: No Other Position/Activity Restrictions: WBAT      Mobility  Bed Mobility Overal bed mobility: Needs Assistance Bed Mobility: Supine to Sit     Supine to sit: Min assist     General bed mobility comments: cues for sequence and use of R LE to self assist  Transfers Overall transfer level: Needs assistance Equipment used: Rolling walker (2 wheeled) Transfers: Sit to/from Stand Sit to Stand: Min assist;From elevated surface         General transfer comment: cues for LE management and use of UEs to self assist  Ambulation/Gait Ambulation/Gait assistance: Min assist Ambulation Distance (Feet): 46 Feet Assistive device: Rolling walker (2 wheeled) Gait Pattern/deviations: Step-to pattern;Decreased step length - right;Decreased step length - left;Shuffle;Trunk flexed     General Gait Details: cues for sequence, posture and position from [employer]            Wheelchair Mobility    Modified Rankin (Stroke Patients Only)       Balance                                             Pertinent Vitals/Pain Pain Assessment: 0-10 Pain Score: 6  Pain Location: L knee Pain Descriptors / Indicators: Aching;Sore Pain Intervention(s): Limited activity within patient's tolerance;Monitored  during session;Premedicated before session;Ice applied    Home Living Family/patient expects to be discharged to:: Skilled nursing facility Living Arrangements: Alone                    Prior Function Level of Independence: Independent               Hand Dominance        Extremity/Trunk Assessment   Upper Extremity Assessment: Overall WFL for tasks assessed           Lower Extremity Assessment: LLE deficits/detail   LLE Deficits / Details: 3/10 quads with AAROM at knee -10-  40  Cervical / Trunk Assessment: Normal  Communication   Communication: No difficulties  Cognition Arousal/Alertness: Awake/alert Behavior During Therapy: WFL for tasks assessed/performed Overall Cognitive Status: Within Functional Limits for tasks assessed                      General Comments      Exercises Total Joint Exercises Ankle Circles/Pumps: AROM;Both;15 reps;Supine Quad Sets: AROM;Both;10 reps;Supine Heel Slides: AAROM;Left;15 reps;Supine Straight Leg Raises: AAROM;AROM;Left;15 reps;Supine      Assessment/Plan    PT Assessment Patient needs continued PT services  PT Diagnosis Difficulty walking   PT Problem List Decreased strength;Decreased range of motion;Decreased activity tolerance;Decreased mobility;Decreased knowledge of use of DME;Pain  PT Treatment Interventions DME instruction;Gait training;Stair training;Functional mobility training;Therapeutic activities;Therapeutic exercise;Patient/family education  PT Goals (Current goals can be found in the Care Plan section) Acute Rehab PT Goals Patient Stated Goal: Resume previous lifestyle with decreased pain PT Goal Formulation: With patient Time For Goal Achievement: 04/21/15 Potential to Achieve Goals: Good    Frequency 7X/week   Barriers to discharge        Co-evaluation               End of Session Equipment Utilized During Treatment: Gait belt Activity Tolerance: Patient tolerated  treatment well Patient left: in chair;with call bell/phone within reach Nurse Communication: Mobility status         Time: 0350-0938 PT Time Calculation (min) (ACUTE ONLY): 30 min   Charges:   PT Evaluation $Initial PT Evaluation Tier I: 1 Procedure PT Treatments $Therapeutic Exercise: 8-22 mins   PT G Codes:        Kimberly Cook 04/28/15, 12:12 PM

## 2015-04-19 DIAGNOSIS — G4700 Insomnia, unspecified: Secondary | ICD-10-CM | POA: Diagnosis not present

## 2015-04-19 DIAGNOSIS — M1990 Unspecified osteoarthritis, unspecified site: Secondary | ICD-10-CM | POA: Diagnosis not present

## 2015-04-19 DIAGNOSIS — E039 Hypothyroidism, unspecified: Secondary | ICD-10-CM | POA: Diagnosis not present

## 2015-04-19 DIAGNOSIS — K219 Gastro-esophageal reflux disease without esophagitis: Secondary | ICD-10-CM | POA: Diagnosis not present

## 2015-04-19 DIAGNOSIS — D649 Anemia, unspecified: Secondary | ICD-10-CM | POA: Diagnosis not present

## 2015-04-19 DIAGNOSIS — M810 Age-related osteoporosis without current pathological fracture: Secondary | ICD-10-CM | POA: Diagnosis not present

## 2015-04-19 DIAGNOSIS — I10 Essential (primary) hypertension: Secondary | ICD-10-CM | POA: Diagnosis not present

## 2015-04-19 DIAGNOSIS — R2681 Unsteadiness on feet: Secondary | ICD-10-CM | POA: Diagnosis not present

## 2015-04-19 DIAGNOSIS — D62 Acute posthemorrhagic anemia: Secondary | ICD-10-CM | POA: Diagnosis not present

## 2015-04-19 DIAGNOSIS — M6281 Muscle weakness (generalized): Secondary | ICD-10-CM | POA: Diagnosis not present

## 2015-04-19 DIAGNOSIS — K5900 Constipation, unspecified: Secondary | ICD-10-CM | POA: Diagnosis not present

## 2015-04-19 DIAGNOSIS — B029 Zoster without complications: Secondary | ICD-10-CM | POA: Diagnosis not present

## 2015-04-19 DIAGNOSIS — F329 Major depressive disorder, single episode, unspecified: Secondary | ICD-10-CM | POA: Diagnosis not present

## 2015-04-19 DIAGNOSIS — M1712 Unilateral primary osteoarthritis, left knee: Secondary | ICD-10-CM | POA: Diagnosis not present

## 2015-04-19 DIAGNOSIS — Z471 Aftercare following joint replacement surgery: Secondary | ICD-10-CM | POA: Diagnosis not present

## 2015-04-19 DIAGNOSIS — Z96652 Presence of left artificial knee joint: Secondary | ICD-10-CM | POA: Diagnosis not present

## 2015-04-19 DIAGNOSIS — R269 Unspecified abnormalities of gait and mobility: Secondary | ICD-10-CM | POA: Diagnosis not present

## 2015-04-19 DIAGNOSIS — M25562 Pain in left knee: Secondary | ICD-10-CM | POA: Diagnosis not present

## 2015-04-19 LAB — CBC
HCT: 30.6 % — ABNORMAL LOW (ref 36.0–46.0)
Hemoglobin: 10.7 g/dL — ABNORMAL LOW (ref 12.0–15.0)
MCH: 31.4 pg (ref 26.0–34.0)
MCHC: 35 g/dL (ref 30.0–36.0)
MCV: 89.7 fL (ref 78.0–100.0)
Platelets: 178 10*3/uL (ref 150–400)
RBC: 3.41 MIL/uL — ABNORMAL LOW (ref 3.87–5.11)
RDW: 12.2 % (ref 11.5–15.5)
WBC: 8.4 10*3/uL (ref 4.0–10.5)

## 2015-04-19 LAB — BASIC METABOLIC PANEL
Anion gap: 6 (ref 5–15)
BUN: 20 mg/dL (ref 6–20)
CO2: 25 mmol/L (ref 22–32)
Calcium: 9 mg/dL (ref 8.9–10.3)
Chloride: 105 mmol/L (ref 101–111)
Creatinine, Ser: 0.89 mg/dL (ref 0.44–1.00)
GFR calc Af Amer: >60 mL/min (ref >60)
GFR calc non Af Amer: >60 mL/min (ref >60)
Glucose, Bld: 118 mg/dL — ABNORMAL HIGH (ref 65–99)
Potassium: 4.5 mmol/L (ref 3.5–5.1)
Sodium: 136 mmol/L (ref 135–145)

## 2015-04-19 MED ORDER — PROMETHAZINE HCL 12.5 MG PO TABS: 12.5000 mg | ORAL_TABLET | Freq: Four times a day (QID) | ORAL | Status: DC | PRN

## 2015-04-19 MED ORDER — HYDROCODONE-ACETAMINOPHEN 7.5-325 MG PO TABS: 1.0000 | ORAL_TABLET | ORAL | Status: DC | PRN

## 2015-04-19 MED ORDER — RIVAROXABAN 10 MG PO TABS: 10.0000 mg | ORAL_TABLET | ORAL | Status: DC

## 2015-04-19 MED ORDER — TIZANIDINE HCL 4 MG PO TABS: 4.0000 mg | ORAL_TABLET | Freq: Four times a day (QID) | ORAL | Status: DC | PRN

## 2015-04-19 MED ORDER — FERROUS SULFATE 325 (65 FE) MG PO TABS: 325.0000 mg | ORAL_TABLET | Freq: Three times a day (TID) | ORAL | Status: DC

## 2015-04-19 MED ORDER — DOCUSATE SODIUM 100 MG PO CAPS: 100.0000 mg | ORAL_CAPSULE | Freq: Two times a day (BID) | ORAL | Status: DC

## 2015-04-19 MED ORDER — POLYETHYLENE GLYCOL 3350 17 G PO PACK: 17.0000 g | PACK | Freq: Two times a day (BID) | ORAL | Status: DC

## 2015-04-19 NOTE — Progress Notes (Signed)
Physical Therapy Treatment Patient Details Name: Kimberly Cook MRN: [id number] DOB: Dec 15, 1945 Today's Date: 04/19/2015    History of Present Illness L TKR    PT Comments    Pt very motivated and progressing well with mobility - ltd this am by nausea.  Follow Up Recommendations  SNF     Equipment Recommendations  None recommended by PT    Recommendations for Other Services OT consult     Precautions / Restrictions Precautions Precautions: Knee;Fall Restrictions Weight Bearing Restrictions: No Other Position/Activity Restrictions: WBAT    Mobility  Bed Mobility Overal bed mobility: Needs Assistance Bed Mobility: Supine to Sit;Sit to Supine     Supine to sit: Min guard Sit to supine: Min assist   General bed mobility comments: cues for sequence, min assist for L LE  Transfers Overall transfer level: Needs assistance Equipment used: Rolling walker (2 wheeled) Transfers: Sit to/from Stand Sit to Stand: Min guard         General transfer comment: cues for UE/LE placement  Ambulation/Gait Ambulation/Gait assistance: Min guard Ambulation Distance (Feet): 80 Feet (twice) Assistive device: Rolling walker (2 wheeled) Gait Pattern/deviations: Step-to pattern;Decreased step length - right;Decreased step length - left;Shuffle;Trunk flexed     General Gait Details: cues for sequence, posture and position from RW.  Standing rest required 2* transient nausea episodes   Stairs            Wheelchair Mobility    Modified Rankin (Stroke Patients Only)       Balance                                    Cognition Arousal/Alertness: Awake/alert Behavior During Therapy: WFL for tasks assessed/performed Overall Cognitive Status: Within Functional Limits for tasks assessed                      Exercises Total Joint Exercises Ankle Circles/Pumps: AROM;Both;15 reps;Supine Quad Sets: AROM;Both;Supine;20 reps Heel Slides:  AAROM;Left;Supine;20 reps Straight Leg Raises: AAROM;AROM;Left;Supine;20 reps Long Arc Quad: AROM;Left;10 reps Goniometric ROM: AAROM at knee -10-  75    General Comments        Pertinent Vitals/Pain Pain Assessment: 0-10 Pain Score: 4  Pain Location: L knee Pain Descriptors / Indicators: Aching;Sore Pain Intervention(s): Limited activity within patient's tolerance;Monitored during session;Premedicated before session;Ice applied    Home Living                      Prior Function            PT Goals (current goals can now be found in the care plan section) Acute Rehab PT Goals Patient Stated Goal: Resume previous lifestyle with decreased pain PT Goal Formulation: With patient Time For Goal Achievement: 04/21/15 Potential to Achieve Goals: Good Progress towards PT goals: Progressing toward goals    Frequency  7X/week    PT Plan Current plan remains appropriate    Co-evaluation             End of Session Equipment Utilized During Treatment: Gait belt Activity Tolerance: Patient tolerated treatment well Patient left: in bed;with call bell/phone within reach     Time: 0922-1000 PT Time Calculation (min) (ACUTE ONLY): 38 min  Charges:  $Gait Training: 23-37 mins $Therapeutic Exercise: 8-22 mins                    G Codes:  Jamarco Zaldivar 04/19/2015, 10:26 AM

## 2015-04-19 NOTE — Progress Notes (Signed)
Pt d/c to [facility]. Report given to receiving RN, Stefani Dama.

## 2015-04-19 NOTE — Plan of Care (Signed)
Problem: Phase III Progression Outcomes Goal: Anticoagulant follow-up in place Outcome: Not Applicable Date Met:  93/73/42 Xarelto VTE, no f/u needed.

## 2015-04-19 NOTE — Progress Notes (Signed)
     Subjective: 2 Days Post-Op Procedure(s) (LRB): LEFT TOTAL KNEE ARTHROPLASTY (Left)   Patient reports pain as mild, pain controlled. No events throughout the night. States that she is very excited to keep progressing with therapy. Ready to be discharged to skilled nursing facility.  Objective:   VITALS:   Filed Vitals:   04/19/15 0501  BP: 125/56  Pulse: 79  Temp: 98.2 F (36.8 C)  Resp: 18    Dorsiflexion/Plantar flexion intact Incision: dressing C/D/I No cellulitis present Compartment soft  LABS  Recent Labs  04/18/15 0430 04/19/15 0432  HGB 11.5* 10.7*  HCT 32.9* 30.6*  WBC 8.6 8.4  PLT 156 178     Recent Labs  04/18/15 0430 04/19/15 0432  NA 134* 136  K 4.6 4.5  BUN 19 20  CREATININE 0.94 0.89  GLUCOSE 141* 118*     Assessment/Plan: 2 Days Post-Op Procedure(s) (LRB): LEFT TOTAL KNEE ARTHROPLASTY (Left) Up with therapy Discharge to SNF  Follow up in 2 weeks at [facility]. Follow up with OLIN,Amanuel Sinkfield D in 2 weeks.  Contact information:  [facility] [address] [place] [address] [phone]    Overweight (BMI 25-29.9) Estimated body mass index is 28.82 kg/(m^2) as calculated from the following:   Height as of this encounter: 5\' 4"  (1.626 m).   Weight as of this encounter: 76.204 kg (168 lb). Patient also counseled that weight may inhibit the healing process Patient counseled that losing weight will help with future health issues        Anastasio Auerbach. Findley Vi   PAC  04/19/2015, 9:21 AM

## 2015-04-19 NOTE — Discharge Summary (Signed)
Physician Discharge Summary  Patient ID: Kimberly Cook MRN: [id number] DOB/AGE: Feb 16, 1946 69 y.o.  Admit date: 04/17/2015 Discharge date:  04/19/2015  Procedures:  Procedure(s) (LRB): LEFT TOTAL KNEE ARTHROPLASTY (Left)  Attending Physician:  Dr. Durene Romans   Admission Diagnoses:   Left knee primary OA / pain  Discharge Diagnoses:  Principal Problem:   S/P left TKA Active Problems:   S/P knee replacement  Past Medical History  Diagnosis Date  . Hyperlipidemia   . GERD (gastroesophageal reflux disease)   . Arthritis   . Depression   . Vitamin D deficiency   . Prediabetes   . Occipital neuralgia   . Hypertension     Normal cardiolite 05/2006 EF 71%  . Mini stroke 2011   . Hypothyroidism   . Urinary tract infection     hx of   . Headache     HPI:    Kimberly Cook, 69 y.o. female, has a history of pain and functional disability in the left knee due to arthritis and has failed non-surgical conservative treatments for greater than 12 weeks to include NSAID's and/or analgesics and activity modification. Onset of symptoms was gradual, starting 4+ years ago with gradually worsening course since that time. The patient noted prior procedures on the knee to include arthroscopy on the left knee(s). Patient currently rates pain in the left knee(s) at 8 out of 10 with activity. Patient has night pain, worsening of pain with activity and weight bearing, pain that interferes with activities of daily living, pain with passive range of motion, crepitus and joint swelling. Patient has evidence of periarticular osteophytes and joint space narrowing by imaging studies. There is no active infection. Risks, benefits and expectations were discussed with the patient. Risks including but not limited to the risk of anesthesia, blood clots, nerve damage, blood vessel damage, failure of the prosthesis, infection and up to and including death. Patient understand the risks, benefits and  expectations and wishes to proceed with surgery.   PCP: Nadean Corwin, MD   Discharged Condition: good  Hospital Course:  Patient underwent the above stated procedure on 04/17/2015. Patient tolerated the procedure well and brought to the recovery room in good condition and subsequently to the floor.  POD #1 BP: 119/81 ; Pulse: 72 ; Temp: 97.9 F (36.6 C) ; Resp: 16 Patient reports pain as moderate. Not much sleep last night.  Dorsiflexion/plantar flexion intact, incision: dressing C/D/I, no cellulitis present and compartment soft.   LABS  Basename    HGB  11.5  HCT  32.9   POD #2  BP: 125/56 ; Pulse: 79 ; Temp: 98.2 F (36.8 C) ; Resp: 18 Patient reports pain as mild, pain controlled. No events throughout the night. States that she is very excited to keep progressing with therapy. Ready to be discharged to skilled nursing facility. Dorsiflexion/plantar flexion intact, incision: dressing C/D/I, no cellulitis present and compartment soft.   LABS  Basename    HGB  10.7  HCT  30.6    Discharge Exam: General appearance: alert, cooperative and no distress Extremities: Homans sign is negative, no sign of DVT, no edema, redness or tenderness in the calves or thighs and no ulcers, gangrene or trophic changes  Disposition: Home with follow up in 2 weeks   Follow-up Information    Follow up with Shelda Pal, MD. Schedule an appointment as soon as possible for a visit in 2 weeks.   Specialty:  Orthopedic Surgery   Contact information:  [address] [place] [address] [phone]           Discharge Instructions    Call MD / Call 911    Complete by:  As directed   If you experience chest pain or shortness of breath, CALL 911 and be transported to the hospital emergency room.  If you develope a fever above 101 F, pus (white drainage) or increased drainage or redness at the wound, or calf pain, call your surgeon's office.     Change dressing     Complete by:  As directed   Maintain surgical dressing until follow up in the clinic. If the edges start to pull up, may reinforce with tape. If the dressing is no longer working, may remove and cover with gauze and tape, but must keep the area dry and clean.  Call with any questions or concerns.     Constipation Prevention    Complete by:  As directed   Drink plenty of fluids.  Prune juice may be helpful.  You may use a stool softener, such as Colace (over the counter) 100 mg twice a day.  Use MiraLax (over the counter) for constipation as needed.     Diet - low sodium heart healthy    Complete by:  As directed      Discharge instructions    Complete by:  As directed   Maintain surgical dressing until follow up in the clinic. If the edges start to pull up, may reinforce with tape. If the dressing is no longer working, may remove and cover with gauze and tape, but must keep the area dry and clean.  Follow up in 2 weeks at [facility]. Call with any questions or concerns.     Increase activity slowly as tolerated    Complete by:  As directed   Weight bearing as tolerated with assist device (walker, cane, etc) as directed, use it as long as suggested by your surgeon or therapist, typically at least 4-6 weeks.     TED hose    Complete by:  As directed   Use stockings (TED hose) for 2 weeks on both leg(s).  You may remove them at night for sleeping.             Medication List    STOP taking these medications        aspirin 325 MG tablet     meloxicam 15 MG tablet  Commonly known as:  MOBIC      TAKE these medications        Biotin 1 MG Caps  Take 5,000 mcg by mouth.     calcium carbonate 600 MG Tabs tablet  Commonly known as:  OS-CAL  Take 600 mg by mouth daily with breakfast.     docusate sodium 100 MG capsule  Commonly known as:  COLACE  Take 1 capsule (100 mg total) by mouth 2 (two) times daily.     ferrous sulfate 325 (65 FE) MG tablet  Take 1 tablet (325 mg  total) by mouth 3 (three) times daily after meals.     HYDROcodone-acetaminophen 7.5-325 MG per tablet  Commonly known as:  NORCO  Take 1-2 tablets by mouth every 4 (four) hours as needed for moderate pain.     levothyroxine 112 MCG tablet  Commonly known as:  SYNTHROID, LEVOTHROID  Take 1 tablet (112 mcg total) by mouth daily.     lisinopril 20 MG tablet  Commonly known as:  PRINIVIL,ZESTRIL  Take 1 tablet (20 mg total) by mouth daily. Take 1 tablet daily for BP     MAGNESIUM OXIDE PO  Take 1 capsule by mouth daily.     omeprazole 40 MG capsule  Commonly known as:  PRILOSEC  Take 1 capsule (40 mg total) by mouth daily.     polyethylene glycol packet  Commonly known as:  MIRALAX / GLYCOLAX  Take 17 g by mouth 2 (two) times daily.     promethazine 12.5 MG tablet  Commonly known as:  PHENERGAN  Take 1 tablet (12.5 mg total) by mouth every 6 (six) hours as needed for nausea or vomiting.     rivaroxaban 10 MG Tabs tablet  Commonly known as:  XARELTO  Take 1 tablet (10 mg total) by mouth daily.     simvastatin 40 MG tablet  Commonly known as:  ZOCOR  Take 1 tablet (40 mg total) by mouth every evening.     temazepam 30 MG capsule  Commonly known as:  RESTORIL  Take 1 capsule at bedtime only if needed for sleep     tiZANidine 4 MG tablet  Commonly known as:  ZANAFLEX  Take 1 tablet (4 mg total) by mouth every 6 (six) hours as needed for muscle spasms.     Vitamin B-12 5000 MCG Subl  Take 5,000 mcg by mouth daily.     Vitamin D3 2000 UNITS capsule  Take 6,000 Units by mouth daily.         Signed: Anastasio Auerbach. Lundon Verdejo   PA-C  04/19/2015, 9:47 AM

## 2015-04-19 NOTE — Clinical Social Work Placement (Signed)
   CLINICAL SOCIAL WORK PLACEMENT  NOTE  Date:  04/19/2015  Patient Details  Name: Kimberly Cook MRN: [id number] Date of Birth: 07-25-1946  Clinical Social Work is seeking post-discharge placement for this patient at the Skilled  Nursing Facility level of care (*CSW will initial, date and re-position this form in  chart as items are completed):  No   Patient/family provided with [facility] Clinical Social Work Department's list of facilities offering this level of care within the geographic area requested by the patient (or if unable, by the patient's family).  Yes   Patient/family informed of their freedom to choose among providers that offer the needed level of care, that participate in Medicare, Medicaid or managed care program needed by the patient, have an available bed and are willing to accept the patient.  No   Patient/family informed of [facility]'s ownership interest in [facility] and [facility], as well as of the fact that they are under no obligation to receive care at these facilities.  PASRR submitted to EDS on 04/17/15     PASRR number received on 04/17/15     Existing PASRR number confirmed on       FL2 transmitted to all facilities in geographic area requested by pt/family on 04/18/15     FL2 transmitted to all facilities within larger geographic area on       Patient informed that his/her managed care company has contracts with or will negotiate with certain facilities, including the following:        Yes   Patient/family informed of bed offers received.  Patient chooses bed at [facility]     Physician recommends and patient chooses bed at      Patient to be transferred to [facility] on 04/19/15.  Patient to be transferred to facility by [facility]     Patient family notified on 04/12/15 of transfer.  Name of family member notified:  Pt informed family directly.     PHYSICIAN       Additional Comment: Pt was in agreement with d/c to [facility] today. [facility] transport was required. Pt is aware that out of pocket costs may be associated with [facility] transport. NSG reviewed d/c summary, scripts, avs. Scripts included in d/c packet. D/c summary sent to SNF for review prior to d/c.   _______________________________________________ Royetta Asal, LCSW 04/19/2015, 3:49 PM

## 2015-04-19 NOTE — Care Management Important Message (Signed)
Important Message  Patient Details  Name: Kimberly Cook MRN: [id number] Date of Birth: 1946/03/05   Medicare Important Message Given:  [facility] notification given    Haskell Flirt 04/19/2015, 11:44 AMImportant Message  Patient Details  Name: Kimberly Cook MRN: [id number] Date of Birth: [birth year]   Medicare Important Message Given:  Yes-second notification given    Haskell Flirt 04/19/2015, 11:44 AM

## 2015-04-20 ENCOUNTER — Non-Acute Institutional Stay (SKILLED_NURSING_FACILITY): Payer: Commercial Managed Care - HMO | Admitting: Internal Medicine

## 2015-04-20 ENCOUNTER — Other Ambulatory Visit: Payer: Self-pay | Admitting: *Deleted

## 2015-04-20 DIAGNOSIS — R2681 Unsteadiness on feet: Secondary | ICD-10-CM

## 2015-04-20 DIAGNOSIS — K219 Gastro-esophageal reflux disease without esophagitis: Secondary | ICD-10-CM

## 2015-04-20 DIAGNOSIS — D62 Acute posthemorrhagic anemia: Secondary | ICD-10-CM

## 2015-04-20 DIAGNOSIS — K5900 Constipation, unspecified: Secondary | ICD-10-CM | POA: Diagnosis not present

## 2015-04-20 DIAGNOSIS — E039 Hypothyroidism, unspecified: Secondary | ICD-10-CM | POA: Diagnosis not present

## 2015-04-20 DIAGNOSIS — M1712 Unilateral primary osteoarthritis, left knee: Secondary | ICD-10-CM | POA: Diagnosis not present

## 2015-04-20 DIAGNOSIS — I10 Essential (primary) hypertension: Secondary | ICD-10-CM

## 2015-04-20 DIAGNOSIS — G4700 Insomnia, unspecified: Secondary | ICD-10-CM

## 2015-04-20 MED ORDER — TEMAZEPAM 30 MG PO CAPS: ORAL_CAPSULE | ORAL | Status: DC

## 2015-04-20 NOTE — Progress Notes (Signed)
Patient ID: Kimberly Cook, female   DOB: May 17, 1946, 69 y.o.   MRN: [id number]     Facility: [facility]    PCP: Nadean Corwin, MD  Code Status: full code  No Known Allergies  Chief Complaint  Patient presents with  . New Admit To SNF     HPI:  69 y.o. patient is here for short term rehabilitation post hospital admission from 04/17/15-04/19/15 with primary OA of left knee. She underwent left total knee arthroplasty. She is seen in her room today. She denies any concerns. She has PMH of HTN, GERD, Depression, OA among others  Review of Systems:  Constitutional: Negative for fever, chills, diaphoresis.  HENT: Negative for headache, congestion, nasal discharge, hearing loss   Eyes: Negative for eye pain, blurred vision, double vision and discharge.  Respiratory: Negative for cough, shortness of breath and wheezing.   Cardiovascular: Negative for chest pain, palpitations, leg swelling.  Gastrointestinal: Negative for heartburn, nausea, vomiting, abdominal pain. Had bowel movement this am Genitourinary: Negative for dysuria, flank pain.  Musculoskeletal: Negative for back pain, falls Skin: Negative for itching, rash.  Neurological: Negative for dizziness, tingling, focal weakness Psychiatric/Behavioral: Negative for depression   Past Medical History  Diagnosis Date  . Hyperlipidemia   . GERD (gastroesophageal reflux disease)   . Arthritis   . Depression   . Vitamin D deficiency   . Prediabetes   . Occipital neuralgia   . Hypertension     Normal cardiolite 05/2006 EF 70%  . Mini stroke 2011   . Hypothyroidism   . Urinary tract infection     hx of   . Headache    Past Surgical History  Procedure Laterality Date  . Breast biopsy Right over 20 years     benign  . Cholecystectomy    . Bladder surgery    . Tonsillectomy and adenoidectomy    . Knee arthroscopy Left 2011  . Abdominal hysterectomy    . Gastroplication     . Total  knee arthroplasty Left 04/17/2015    Procedure: LEFT TOTAL KNEE ARTHROPLASTY;  Surgeon: Durene Romans, MD;  Location: [facility];  Service: Orthopedics;  Laterality: Left;   Social History:   reports that she has never smoked. She has never used smokeless tobacco. She reports that she does not drink alcohol or use illicit drugs.  Family History  Problem Relation Age of Onset  . Heart disease Mother   . Hypertension Mother   . Diabetes Mother   . Heart disease Father   . Cancer Maternal Aunt     breast  . Stroke Neg Hx     Medications:   Medication List       This list is accurate as of: 04/20/15  9:44 AM.  Always use your most recent med list.               Biotin 1 MG Caps  Take 5,000 mcg by mouth.     calcium carbonate 600 MG Tabs tablet  Commonly known as:  OS-CAL  Take 600 mg by mouth daily with breakfast.     docusate sodium 100 MG capsule  Commonly known as:  COLACE  Take 1 capsule (100 mg total) by mouth 2 (two) times daily.     ferrous sulfate 325 (65 FE) MG tablet  Take 1 tablet (325 mg total) by mouth 3 (three) times daily after meals.     HYDROcodone-acetaminophen 7.5-325 MG per tablet  Commonly known as:  NORCO  Take 1-2 tablets by mouth every 4 (four) hours as needed for moderate pain.     levothyroxine 112 MCG tablet  Commonly known as:  SYNTHROID, LEVOTHROID  Take 1 tablet (112 mcg total) by mouth daily.     lisinopril 20 MG tablet  Commonly known as:  PRINIVIL,ZESTRIL  Take 1 tablet (20 mg total) by mouth daily. Take 1 tablet daily for BP     MAGNESIUM OXIDE PO  Take 1 capsule by mouth daily.     omeprazole 40 MG capsule  Commonly known as:  PRILOSEC  Take 1 capsule (40 mg total) by mouth daily.     polyethylene glycol packet  Commonly known as:  MIRALAX / GLYCOLAX  Take 17 g by mouth 2 (two) times daily.     promethazine 12.5 MG tablet  Commonly known as:  PHENERGAN  Take 1 tablet (12.5 mg total) by mouth every 6 (six) hours as needed for  nausea or vomiting.     rivaroxaban 10 MG Tabs tablet  Commonly known as:  XARELTO  Take 1 tablet (10 mg total) by mouth daily.     simvastatin 40 MG tablet  Commonly known as:  ZOCOR  Take 1 tablet (40 mg total) by mouth every evening.     temazepam 30 MG capsule  Commonly known as:  RESTORIL  Take 1 capsule at bedtime only if needed for sleep     tiZANidine 4 MG tablet  Commonly known as:  ZANAFLEX  Take 1 tablet (4 mg total) by mouth every 6 (six) hours as needed for muscle spasms.     Vitamin B-12 5000 MCG Subl  Take 5,000 mcg by mouth daily.     Vitamin D3 2000 UNITS capsule  Take 6,000 Units by mouth daily.         Physical Exam: Filed Vitals:   04/20/15 0943  BP: 111/71  Pulse: 93  Temp: 98.3 F (36.8 C)  Resp: 18  SpO2: 94%    General- elderly female, obese, in no acute distress Head- normocephalic, atraumatic Nose- normal nasal mucosa, no maxillary or frontal sinus tenderness, no nasal discharge Throat- moist mucus membrane Eyes- PERRLA, EOMI, no pallor, no icterus, no discharge, normal conjunctiva, normal sclera Neck- no cervical lymphadenopathy Cardiovascular- normal s1,s2, no murmurs, palpable dorsalis pedis and radial pulses, trace leg edema Respiratory- bilateral clear to auscultation, no wheeze, no rhonchi, no crackles, no use of accessory muscles Abdomen- bowel sounds present, soft, non tender Musculoskeletal- able to move all 4 extremities, generalized weakness, limited range of motion to left knee Neurological- no focal deficit, alert and oriented to person, place and time Skin- warm and dry, left knee surgical incision with aquacel dressing Psychiatry- normal mood and affect    Labs reviewed: Basic Metabolic Panel:  Recent Labs  16/10/96 1221 01/11/15 1142  04/06/15 1020 04/18/15 0430 04/19/15 0432  NA 138 138  < > 138 134* 136  K 4.0 4.1  < > 4.3 4.6 4.5  CL 101 102  < > 105 103 105  CO2 31 24  < > GLUCOSE 93 90  < >  105* 141* 118*  BUN 20 23  < > 21* 19 20  CREATININE 0.76 0.82  < > 0.90 0.94 0.89  CALCIUM 10.2 9.8  < > 9.8 8.9 9.0  MG 1.7 2.0  --   --   --   --   < > = values in this interval not displayed.  Liver Function Tests:  Recent Labs  09/21/14 1221 01/11/15 1142 03/15/15 1242  AST 23 17 17   ALT 21 17 17   ALKPHOS 89 100 99  BILITOT 0.6 0.7 0.5  PROT 7.1 7.1 7.2  ALBUMIN 4.5 4.4 4.2   No results for input(s): LIPASE, AMYLASE in the last 8760 hours. No results for input(s): AMMONIA in the last 8760 hours. CBC:  Recent Labs  09/21/14 1221 01/11/15 1142 03/15/15 1242 04/06/15 1020 04/18/15 0430 04/19/15 0432  WBC 5.3 6.0 8.5 5.3 8.6 8.4  NEUTROABS 3.4 3.6 6.5  --   --   --   HGB 13.7 14.2 14.2 13.1 11.5* 10.7*  HCT 40.9 40.9 40.2 38.7 32.9* 30.6*  MCV 91.7 90.5 90.7 91.9 89.4 89.7  PLT 191 212 242 176 156 178    Assessment/Plan  Unsteady gait Post surgical repair of her left knee. Will have patient work with PT/OT as tolerated to regain strength and restore function.  Fall precautions are in place.  Left knee OA S/p left total knee arthroplasty. Will have her work with physical therapy and occupational therapy team to help with gait training and muscle strengthening exercises.fall precautions. Skin care. Encourage to be out of bed. Continue norco 7.5-325 mg 1-2 tab q4 prn pain and zanaflex 4 mg q6h prn muscle spasm. Continue xarelto for dvt prophylaxis. Continue oscal and vit d. Has f/u with orthopedics.  Blood loss anemia Post op, monitor h&h, check cbc in 1 week, continue ferrous sulfate 325 mg tid for now  HTN Stable bo, cotinue lisinopril 20 mg daily  Hypothyroidism Continue levothyroxine 112 mcg daily  Insomnia Her home regimen restoril is helpful, continue this  Constipation Continue miralax bid  gerd Stable, continue prilosec 40 mg daily   Goals of care: short term rehabilitation   Labs/tests ordered: cbc 1 week  Family/ staff Communication:  reviewed care plan with patient and nursing supervisor    Oneal Grout, MD  [facility] [phone] (Monday-Friday 8 am - 5 pm) [phone] (afterhours)

## 2015-04-20 NOTE — Telephone Encounter (Signed)
[facility] 

## 2015-04-21 ENCOUNTER — Telehealth: Payer: Self-pay | Admitting: *Deleted

## 2015-04-21 NOTE — Anesthesia Postprocedure Evaluation (Signed)
  Anesthesia Post-op Note  Patient: Kimberly Cook  Procedure(s) Performed: Procedure(s): LEFT TOTAL KNEE ARTHROPLASTY (Left)  Patient Location: PACU  Anesthesia Type:General  Level of Consciousness: awake, alert  and oriented  Airway and Oxygen Therapy: Patient Spontanous Breathing  Post-op Pain: moderate  Post-op Assessment: Post-op Vital signs reviewed and Patient's Cardiovascular Status Stable LLE Motor Response: Purposeful movement LLE Sensation: Full sensation RLE Motor Response: Purposeful movement   L Sensory Level: S1-Sole of foot, small toes R Sensory Level: S1-Sole of foot, small toes  Post-op Vital Signs: Reviewed and stable  Last Vitals:  Filed Vitals:   04/19/15 0501  BP: 125/56  Pulse: 79  Temp: 36.8 C  Resp: 18    Complications: No apparent anesthesia complications

## 2015-04-21 NOTE — Telephone Encounter (Signed)
Pt aware of lab results of normal BMD.  

## 2015-04-25 ENCOUNTER — Non-Acute Institutional Stay (SKILLED_NURSING_FACILITY): Payer: Commercial Managed Care - HMO | Admitting: Internal Medicine

## 2015-04-25 ENCOUNTER — Ambulatory Visit: Payer: Self-pay | Admitting: Internal Medicine

## 2015-04-25 DIAGNOSIS — B029 Zoster without complications: Secondary | ICD-10-CM | POA: Diagnosis not present

## 2015-04-25 NOTE — Progress Notes (Signed)
Patient ID: Kimberly Cook, female   DOB: 1946/02/05, 69 y.o.   MRN: [id number]     Facility: [facility]    PCP: Nadean Corwin, MD  Code Status: full code  No Known Allergies  Chief Complaint  Patient presents with  . Acute Visit    new rash on the chest area     HPI:  69 y.o. patient is seen with acute concern. She noticed redness in her chest area this Saturday and on Sunday while cleaning herself noticed raised bumps with some tingling sensation. She now has itching and burning sensation in this area. Staff noticed it yesterday evening. She denies any sharp or shooting pain. Denies any fever or chills. she is here for short term rehabilitation post left total knee arthroplasty.   Review of Systems:  Constitutional: Negative for fever, chills, diaphoresis.  HENT: Negative for headache   Eyes: Negative for eye pain, blurred vision, double vision and discharge.  Respiratory: Negative for cough, shortness of breath and wheezing.   Cardiovascular: Negative for chest pain, palpitations, leg swelling.  Gastrointestinal: Negative for heartburn, nausea, vomiting, abdominal pain  Past Medical History  Diagnosis Date  . Hyperlipidemia   . GERD (gastroesophageal reflux disease)   . Arthritis   . Depression   . Vitamin D deficiency   . Prediabetes   . Occipital neuralgia   . Hypertension     Normal cardiolite 05/2006 EF 71%  . Mini stroke 2011   . Hypothyroidism   . Urinary tract infection     hx of   . Headache    Past Surgical History  Procedure Laterality Date  . Breast biopsy Right over 20 years     benign  . Cholecystectomy    . Bladder surgery    . Tonsillectomy and adenoidectomy    . Knee arthroscopy Left 2011  . Abdominal hysterectomy    . Gastroplication     . Total knee arthroplasty Left 04/17/2015    Procedure: LEFT TOTAL KNEE ARTHROPLASTY;  Surgeon: Durene Romans, MD;  Location: [facility];  Service: Orthopedics;  Laterality:  Left;    Medications:   Medication List       This list is accurate as of: 04/25/15 11:41 AM.  Always use your most recent med list.               Biotin 1 MG Caps  Take 5,000 mcg by mouth.     calcium carbonate 600 MG Tabs tablet  Commonly known as:  OS-CAL  Take 600 mg by mouth daily with breakfast.     docusate sodium 100 MG capsule  Commonly known as:  COLACE  Take 1 capsule (100 mg total) by mouth 2 (two) times daily.     ferrous sulfate 325 (65 FE) MG tablet  Take 1 tablet (325 mg total) by mouth 3 (three) times daily after meals.     HYDROcodone-acetaminophen 7.5-325 MG per tablet  Commonly known as:  NORCO  Take 1-2 tablets by mouth every 4 (four) hours as needed for moderate pain.     levothyroxine 112 MCG tablet  Commonly known as:  SYNTHROID, LEVOTHROID  Take 1 tablet (112 mcg total) by mouth daily.     lisinopril 20 MG tablet  Commonly known as:  PRINIVIL,ZESTRIL  Take 1 tablet (20 mg total) by mouth daily. Take 1 tablet daily for BP     MAGNESIUM OXIDE PO  Take 1 capsule by mouth daily.  omeprazole 40 MG capsule  Commonly known as:  PRILOSEC  Take 1 capsule (40 mg total) by mouth daily.     polyethylene glycol packet  Commonly known as:  MIRALAX / GLYCOLAX  Take 17 g by mouth 2 (two) times daily.     promethazine 12.5 MG tablet  Commonly known as:  PHENERGAN  Take 1 tablet (12.5 mg total) by mouth every 6 (six) hours as needed for nausea or vomiting.     rivaroxaban 10 MG Tabs tablet  Commonly known as:  XARELTO  Take 1 tablet (10 mg total) by mouth daily.     simvastatin 40 MG tablet  Commonly known as:  ZOCOR  Take 1 tablet (40 mg total) by mouth every evening.     temazepam 30 MG capsule  Commonly known as:  RESTORIL  Take 1 capsule at bedtime only if needed for sleep     tiZANidine 4 MG tablet  Commonly known as:  ZANAFLEX  Take 1 tablet (4 mg total) by mouth every 6 (six) hours as needed for muscle spasms.     Vitamin B-12  5000 MCG Subl  Take 5,000 mcg by mouth daily.     Vitamin D3 2000 UNITS capsule  Take 6,000 Units by mouth daily.         Physical Exam: Filed Vitals:   04/25/15 1140  BP: 101/63  Pulse: 56  Temp: 97.2 F (36.2 C)  Resp: 18  SpO2: 95%    General- elderly female, obese, in no acute distress Skin- open areas with erythema noted along a dermatome on left chest area above her left breast, non tender, no drainage, no closed vesicles, no crusting  Nose- no maxillary or frontal sinus tenderness, no nasal discharge Throat- moist mucus membrane Eyes- no pallor, no icterus, no discharge, normal conjunctiva, normal sclera Neck- no cervical lymphadenopathy Cardiovascular- normal s1,s2, no murmurs Respiratory- bilateral clear to auscultation Neurological- no focal deficit, alert and oriented to person, place and time   Labs reviewed: Basic Metabolic Panel:  Recent Labs  24/26/83 1221 01/11/15 1142  04/06/15 1020 04/18/15 0430 04/19/15 0432  NA 138 138  < > 138 134* 136  K 4.0 4.1  < > 4.3 4.6 4.5  CL 101 102  < > 105 103 105  CO2 31 24  < > 27 22 25   GLUCOSE 93 90  < > 105* 141* 118*  BUN 20 23  < > 21* 19 20  CREATININE 0.76 0.82  < > 0.90 0.94 0.89  CALCIUM 10.2 9.8  < > 9.8 8.9 9.0  MG 1.7 2.0  --   --   --   --   < > = values in this interval not displayed. Liver Function Tests:  Recent Labs  09/21/14 1221 01/11/15 1142 03/15/15 1242  AST 23 17 17   ALT 21 17 17   ALKPHOS 89 100 99  BILITOT 0.6 0.7 0.5  PROT 7.1 7.1 7.2  ALBUMIN 4.5 4.4 4.2   No results for input(s): LIPASE, AMYLASE in the last 8760 hours. No results for input(s): AMMONIA in the last 8760 hours. CBC:  Recent Labs  09/21/14 1221 01/11/15 1142 03/15/15 1242 04/06/15 1020 04/18/15 0430 04/19/15 0432  WBC 5.3 6.0 8.5 5.3 8.6 8.4  NEUTROABS 3.4 3.6 6.5  --   --   --   HGB 13.7 14.2 14.2 13.1 11.5* 10.7*  HCT 40.9 40.9 40.2 38.7 32.9* 30.6*  MCV 91.7 90.5 90.7 91.9 89.4 89.7  PLT  191  212 242 176 156 178    Assessment/Plan  Shingles Shingles lesion noted along a dermatome in left chest wall area with some neuropathic symptom of burning and tingling. With it being within 72 hr window period, start valyclovir 1 g tid for 1 week. No vesicles at present and no new lesion appearing. Will keep this area covered until lesion is dried up. Currently on norco prn for pain for her left knee arthroplasty. Continue this for now and reassess. Contact and airborne precautions until lesion heals   Oneal Grout, MD  [facility] [phone] (Monday-Friday 8 am - 5 pm) [phone] (afterhours)

## 2015-04-27 ENCOUNTER — Encounter: Payer: Self-pay | Admitting: Internal Medicine

## 2015-04-27 ENCOUNTER — Non-Acute Institutional Stay (SKILLED_NURSING_FACILITY): Payer: Commercial Managed Care - HMO | Admitting: Internal Medicine

## 2015-04-27 DIAGNOSIS — B029 Zoster without complications: Secondary | ICD-10-CM

## 2015-04-27 DIAGNOSIS — R2681 Unsteadiness on feet: Secondary | ICD-10-CM | POA: Diagnosis not present

## 2015-04-27 DIAGNOSIS — K219 Gastro-esophageal reflux disease without esophagitis: Secondary | ICD-10-CM

## 2015-04-27 DIAGNOSIS — I10 Essential (primary) hypertension: Secondary | ICD-10-CM

## 2015-04-27 DIAGNOSIS — M1712 Unilateral primary osteoarthritis, left knee: Secondary | ICD-10-CM

## 2015-04-27 DIAGNOSIS — E039 Hypothyroidism, unspecified: Secondary | ICD-10-CM

## 2015-04-27 DIAGNOSIS — D62 Acute posthemorrhagic anemia: Secondary | ICD-10-CM | POA: Diagnosis not present

## 2015-04-27 NOTE — Progress Notes (Signed)
Patient ID: Kimberly Cook, female   DOB: 1945/09/01, 69 y.o.   MRN: [id number]     [facility]  Code Status: Full Code    Chief Complaint  Patient presents with  . Discharge Note    Discharge form SNF     No Known Allergies   HPI:  69 y.o. patient is seen for discharge visit. She has been here for short term rehabilitation post hospital admission from 04/17/15-04/19/15 with primary OA of left knee status post left total knee arthroplasty. She is seen in her room today. She denies any concerns. Pain under control with current regimen. Currently on treatment for shingles. She has PMH of HTN, GERD, Depression, OA among others  Review of Systems:  Constitutional: Negative for fever, chills, diaphoresis.  HENT: Negative for headache, congestion, nasal discharge, hearing loss   Eyes: Negative for eye pain, blurred vision, double vision and discharge.  Respiratory: Negative for cough, shortness of breath and wheezing.   Cardiovascular: Negative for chest pain, palpitations, leg swelling.  Gastrointestinal: Negative for heartburn, nausea, vomiting, abdominal pain. Had bowel movement this am Genitourinary: Negative for dysuria, flank pain.  Musculoskeletal: Negative for back pain, falls Skin: positive for itching, rash.  Neurological: Negative for dizziness, tingling, focal weakness Psychiatric/Behavioral: Negative for depression   Past Medical History  Diagnosis Date  . Hyperlipidemia   . GERD (gastroesophageal reflux disease)   . Arthritis   . Depression   . Vitamin D deficiency   . Prediabetes   . Occipital neuralgia   . Hypertension     Normal cardiolite 05/2006 EF 71%  . Mini stroke 2011   . Hypothyroidism   . Urinary tract infection     hx of   . Headache    Past Surgical History  Procedure Laterality Date  . Breast biopsy Right over 20 years     benign  . Cholecystectomy    . Bladder surgery    . Tonsillectomy and adenoidectomy    . Knee  arthroscopy Left 2011  . Abdominal hysterectomy    . Gastroplication     . Total knee arthroplasty Left 04/17/2015    Procedure: LEFT TOTAL KNEE ARTHROPLASTY;  Surgeon: Durene Romans, MD;  Location: [facility];  Service: Orthopedics;  Laterality: Left;   Social History:   reports that she has never smoked. She has never used smokeless tobacco. She reports that she does not drink alcohol or use illicit drugs.  Family History  Problem Relation Age of Onset  . Heart disease Mother   . Hypertension Mother   . Diabetes Mother   . Heart disease Father   . Cancer Maternal Aunt     breast  . Stroke Neg Hx     Medications:   Medication List       This list is accurate as of: 04/27/15 11:59 PM.  Always use your most recent med list.               Biotin 1 MG Caps  Take 5,000 mcg by mouth.     calcium carbonate 600 MG Tabs tablet  Commonly known as:  OS-CAL  Take 600 mg by mouth daily with breakfast.     docusate sodium 100 MG capsule  Commonly known as:  COLACE  Take 1 capsule (100 mg total) by mouth 2 (two) times daily.     ferrous sulfate 325 (65 FE) MG tablet  Take 1 tablet (325 mg total) by mouth 3 (three) times  daily after meals.     HYDROcodone-acetaminophen 7.5-325 MG per tablet  Commonly known as:  NORCO  Take 1-2 tablets by mouth every 4 (four) hours as needed for moderate pain.     levothyroxine 112 MCG tablet  Commonly known as:  SYNTHROID, LEVOTHROID  Take 1 tablet (112 mcg total) by mouth daily.     lisinopril 20 MG tablet  Commonly known as:  PRINIVIL,ZESTRIL  Take 1 tablet (20 mg total) by mouth daily. Take 1 tablet daily for BP     MAGNESIUM OXIDE PO  Take 1 capsule by mouth daily. 400 mg     methocarbamol 500 MG tablet  Commonly known as:  ROBAXIN  Take 500 mg by mouth every 6 (six) hours as needed for muscle spasms.     omeprazole 40 MG capsule  Commonly known as:  PRILOSEC  Take 1 capsule (40 mg total) by mouth daily.     polyethylene glycol  packet  Commonly known as:  MIRALAX / GLYCOLAX  Take 17 g by mouth 2 (two) times daily.     promethazine 12.5 MG tablet  Commonly known as:  PHENERGAN  Take 1 tablet (12.5 mg total) by mouth every 6 (six) hours as needed for nausea or vomiting.     rivaroxaban 10 MG Tabs tablet  Commonly known as:  XARELTO  Take 1 tablet (10 mg total) by mouth daily.     simvastatin 40 MG tablet  Commonly known as:  ZOCOR  Take 1 tablet (40 mg total) by mouth every evening.     temazepam 30 MG capsule  Commonly known as:  RESTORIL  Take 1 capsule at bedtime only if needed for sleep     VALTREX 1000 MG tablet  Generic drug:  valACYclovir  Take 1,000 mg by mouth 3 (three) times daily. X 7 days starting 04/25/15     Vitamin B-12 5000 MCG Subl  Take 5,000 mcg by mouth daily.     Vitamin D3 2000 UNITS capsule  Take 6,000 Units by mouth daily.         Physical Exam: Filed Vitals:   04/27/15 1440  BP: 133/81  Pulse: 55  Temp: 98.1 F (36.7 C)  TempSrc: Oral  Resp: 20  SpO2: 95%    General- elderly female, obese, in no acute distress Head- normocephalic, atraumatic Nose- normal nasal mucosa, no maxillary or frontal sinus tenderness, no nasal discharge Throat- moist mucus membrane Eyes- PERRLA, EOMI, no pallor, no icterus, no discharge, normal conjunctiva, normal sclera Neck- no cervical lymphadenopathy Cardiovascular- normal s1,s2, no murmurs, palpable dorsalis pedis and radial pulses, trace leg edema Respiratory- bilateral clear to auscultation, no wheeze, no rhonchi, no crackles, no use of accessory muscles Abdomen- bowel sounds present, soft, non tender Musculoskeletal- able to move all 4 extremities, generalized weakness, limited range of motion to left knee Neurological- no focal deficit, alert and oriented to person, place and time Skin- warm and dry, left knee surgical incision with aquacel dressing, left chest area shingles lesion with no active vesicles or drainage at present,  some crusting noted, mild erythema present, non tender Psychiatry- normal mood and affect    Labs reviewed: Basic Metabolic Panel:  Recent Labs  16/10/96 1221 01/11/15 1142  04/06/15 1020 04/18/15 0430 04/19/15 0432  NA 138 138  < > 138 134* 136  K 4.0 4.1  < > 4.3 4.6 4.5  CL 101 102  < > 105 103 105  CO2 31 24  < > 27  22 25  GLUCOSE 93 90  < > 105* 141* 118*  BUN 20 23  < > 21* 19 20  CREATININE 0.76 0.82  < > 0.90 0.94 0.89  CALCIUM 10.2 9.8  < > 9.8 8.9 9.0  MG 1.7 2.0  --   --   --   --   < > = values in this interval not displayed. Liver Function Tests:  Recent Labs  09/21/14 1221 01/11/15 1142 03/15/15 1242  AST ALT ALKPHOS 89 100 99  BILITOT 0.6 0.7 0.5  PROT 7.1 7.1 7.2  ALBUMIN 4.5 4.4 4.2   No results for input(s): LIPASE, AMYLASE in the last 8760 hours. No results for input(s): AMMONIA in the last 8760 hours. CBC:  Recent Labs  09/21/14 1221 01/11/15 1142 03/15/15 1242 04/06/15 1020 04/18/15 0430 04/19/15 0432  WBC 5.3 6.0 8.5 5.3 8.6 8.4  NEUTROABS 3.4 3.6 6.5  --   --   --   HGB 13.7 14.2 14.2 13.1 11.5* 10.7*  HCT 40.9 40.9 40.2 38.7 32.9* 30.6*  MCV 91.7 90.5 90.7 91.9 89.4 89.7  PLT 191 212 242 176 156 178    Assessment/Plan  Left knee OA S/p left total knee arthroplasty. Will have her continue to work with physical therapy and occupational therapy team to help with gait training and muscle strengthening exercises with her unsteady gait.fall precautions. Skin care. Encourage to be out of bed. Continue norco 7.5-325 mg 1-2 tab q4 prn pain and robaxin 500 mg q6h prn muscle spasm. Continue xarelto for dvt prophylaxis. Continue oscal and vit d. Has f/u with orthopedics on 05/02/15  Shingles Continue and complete one week course of valcyclovir and prn tylenol for pain  Blood loss anemia Post op, continue ferrous sulfate 325 mg tid for now and to follow up with pcp  HTN Stable bp, cotinue lisinopril 20 mg  daily  Constipation Continue miralax bid while on narcotics, hydration encouraged  gerd Stable, continue prilosec 40 mg daily  Hypothyroidism Continue levothyroxine 112 mcg daily   Patient is stable to be discharged home with home health services: physical therapy and script for DME Front wheel walker and 3 in 1 commode provided. Scripts for all her chronic medication for 30 days and pain regimen and muscle relaxant for 2 weeks has been provided.  Family/ staff Communication: reviewed care plan with patient and nursing supervisor    Oneal Grout, MD  [facility] [phone] (Monday-Friday 8 am - 5 pm) [phone] (afterhours)             Medication List       This list is accurate as of: 04/27/15  2:47 PM.  Always use your most recent med list.               Biotin 1 MG Caps  Take 5,000 mcg by mouth.     calcium carbonate 600 MG Tabs tablet  Commonly known as:  OS-CAL  Take 600 mg by mouth daily with breakfast.     docusate sodium 100 MG capsule  Commonly known as:  COLACE  Take 1 capsule (100 mg total) by mouth 2 (two) times daily.     ferrous sulfate 325 (65 FE) MG tablet  Take 1 tablet (325 mg total) by mouth 3 (three) times daily after meals.     HYDROcodone-acetaminophen 7.5-325 MG per tablet  Commonly known as:  NORCO  Take 1-2 tablets by mouth every 4 (  four) hours as needed for moderate pain.     levothyroxine 112 MCG tablet  Commonly known as:  SYNTHROID, LEVOTHROID  Take 1 tablet (112 mcg total) by mouth daily.     lisinopril 20 MG tablet  Commonly known as:  PRINIVIL,ZESTRIL  Take 1 tablet (20 mg total) by mouth daily. Take 1 tablet daily for BP     MAGNESIUM OXIDE PO  Take 1 capsule by mouth daily. 400 mg     methocarbamol 500 MG tablet  Commonly known as:  ROBAXIN  Take 500 mg by mouth every 6 (six) hours as needed for muscle spasms.     omeprazole 40 MG capsule  Commonly known as:  PRILOSEC  Take 1 capsule (40 mg  total) by mouth daily.     polyethylene glycol packet  Commonly known as:  MIRALAX / GLYCOLAX  Take 17 g by mouth 2 (two) times daily.     promethazine 12.5 MG tablet  Commonly known as:  PHENERGAN  Take 1 tablet (12.5 mg total) by mouth every 6 (six) hours as needed for nausea or vomiting.     rivaroxaban 10 MG Tabs tablet  Commonly known as:  XARELTO  Take 1 tablet (10 mg total) by mouth daily.     simvastatin 40 MG tablet  Commonly known as:  ZOCOR  Take 1 tablet (40 mg total) by mouth every evening.     temazepam 30 MG capsule  Commonly known as:  RESTORIL  Take 1 capsule at bedtime only if needed for sleep     VALTREX 1000 MG tablet  Generic drug:  valACYclovir  Take 1,000 mg by mouth 3 (three) times daily. X 7 days starting 04/25/15     Vitamin B-12 5000 MCG Subl  Take 5,000 mcg by mouth daily.     Vitamin D3 2000 UNITS capsule  Take 6,000 Units by mouth daily.

## 2015-05-08 DIAGNOSIS — Z96652 Presence of left artificial knee joint: Secondary | ICD-10-CM | POA: Diagnosis not present

## 2015-05-08 DIAGNOSIS — Z87440 Personal history of urinary (tract) infections: Secondary | ICD-10-CM | POA: Diagnosis not present

## 2015-05-08 DIAGNOSIS — F329 Major depressive disorder, single episode, unspecified: Secondary | ICD-10-CM | POA: Diagnosis not present

## 2015-05-08 DIAGNOSIS — M5481 Occipital neuralgia: Secondary | ICD-10-CM | POA: Diagnosis not present

## 2015-05-08 DIAGNOSIS — Z8673 Personal history of transient ischemic attack (TIA), and cerebral infarction without residual deficits: Secondary | ICD-10-CM | POA: Diagnosis not present

## 2015-05-08 DIAGNOSIS — M1712 Unilateral primary osteoarthritis, left knee: Secondary | ICD-10-CM | POA: Diagnosis not present

## 2015-05-08 DIAGNOSIS — Z471 Aftercare following joint replacement surgery: Secondary | ICD-10-CM | POA: Diagnosis not present

## 2015-05-08 DIAGNOSIS — I10 Essential (primary) hypertension: Secondary | ICD-10-CM | POA: Diagnosis not present

## 2015-05-08 DIAGNOSIS — R7303 Prediabetes: Secondary | ICD-10-CM | POA: Diagnosis not present

## 2015-05-09 DIAGNOSIS — Z87440 Personal history of urinary (tract) infections: Secondary | ICD-10-CM | POA: Diagnosis not present

## 2015-05-09 DIAGNOSIS — Z8673 Personal history of transient ischemic attack (TIA), and cerebral infarction without residual deficits: Secondary | ICD-10-CM | POA: Diagnosis not present

## 2015-05-09 DIAGNOSIS — M5481 Occipital neuralgia: Secondary | ICD-10-CM | POA: Diagnosis not present

## 2015-05-09 DIAGNOSIS — Z96652 Presence of left artificial knee joint: Secondary | ICD-10-CM | POA: Diagnosis not present

## 2015-05-09 DIAGNOSIS — F329 Major depressive disorder, single episode, unspecified: Secondary | ICD-10-CM | POA: Diagnosis not present

## 2015-05-09 DIAGNOSIS — R7303 Prediabetes: Secondary | ICD-10-CM | POA: Diagnosis not present

## 2015-05-09 DIAGNOSIS — Z471 Aftercare following joint replacement surgery: Secondary | ICD-10-CM | POA: Diagnosis not present

## 2015-05-09 DIAGNOSIS — M1712 Unilateral primary osteoarthritis, left knee: Secondary | ICD-10-CM | POA: Diagnosis not present

## 2015-05-09 DIAGNOSIS — I10 Essential (primary) hypertension: Secondary | ICD-10-CM | POA: Diagnosis not present

## 2015-05-11 DIAGNOSIS — Z8673 Personal history of transient ischemic attack (TIA), and cerebral infarction without residual deficits: Secondary | ICD-10-CM | POA: Diagnosis not present

## 2015-05-11 DIAGNOSIS — Z96652 Presence of left artificial knee joint: Secondary | ICD-10-CM | POA: Diagnosis not present

## 2015-05-11 DIAGNOSIS — F329 Major depressive disorder, single episode, unspecified: Secondary | ICD-10-CM | POA: Diagnosis not present

## 2015-05-11 DIAGNOSIS — R7303 Prediabetes: Secondary | ICD-10-CM | POA: Diagnosis not present

## 2015-05-11 DIAGNOSIS — Z87440 Personal history of urinary (tract) infections: Secondary | ICD-10-CM | POA: Diagnosis not present

## 2015-05-11 DIAGNOSIS — M5481 Occipital neuralgia: Secondary | ICD-10-CM | POA: Diagnosis not present

## 2015-05-11 DIAGNOSIS — I10 Essential (primary) hypertension: Secondary | ICD-10-CM | POA: Diagnosis not present

## 2015-05-11 DIAGNOSIS — M1712 Unilateral primary osteoarthritis, left knee: Secondary | ICD-10-CM | POA: Diagnosis not present

## 2015-05-11 DIAGNOSIS — Z471 Aftercare following joint replacement surgery: Secondary | ICD-10-CM | POA: Diagnosis not present

## 2015-05-13 ENCOUNTER — Encounter: Payer: Self-pay | Admitting: *Deleted

## 2015-05-15 DIAGNOSIS — M5481 Occipital neuralgia: Secondary | ICD-10-CM | POA: Diagnosis not present

## 2015-05-15 DIAGNOSIS — Z96652 Presence of left artificial knee joint: Secondary | ICD-10-CM | POA: Diagnosis not present

## 2015-05-15 DIAGNOSIS — Z8673 Personal history of transient ischemic attack (TIA), and cerebral infarction without residual deficits: Secondary | ICD-10-CM | POA: Diagnosis not present

## 2015-05-15 DIAGNOSIS — R7303 Prediabetes: Secondary | ICD-10-CM | POA: Diagnosis not present

## 2015-05-15 DIAGNOSIS — F329 Major depressive disorder, single episode, unspecified: Secondary | ICD-10-CM | POA: Diagnosis not present

## 2015-05-15 DIAGNOSIS — I10 Essential (primary) hypertension: Secondary | ICD-10-CM | POA: Diagnosis not present

## 2015-05-15 DIAGNOSIS — Z471 Aftercare following joint replacement surgery: Secondary | ICD-10-CM | POA: Diagnosis not present

## 2015-05-15 DIAGNOSIS — M1712 Unilateral primary osteoarthritis, left knee: Secondary | ICD-10-CM | POA: Diagnosis not present

## 2015-05-15 DIAGNOSIS — Z87440 Personal history of urinary (tract) infections: Secondary | ICD-10-CM | POA: Diagnosis not present

## 2015-05-18 ENCOUNTER — Other Ambulatory Visit: Payer: Self-pay | Admitting: Physician Assistant

## 2015-05-18 DIAGNOSIS — Z8673 Personal history of transient ischemic attack (TIA), and cerebral infarction without residual deficits: Secondary | ICD-10-CM | POA: Diagnosis not present

## 2015-05-18 DIAGNOSIS — Z471 Aftercare following joint replacement surgery: Secondary | ICD-10-CM | POA: Diagnosis not present

## 2015-05-18 DIAGNOSIS — M5481 Occipital neuralgia: Secondary | ICD-10-CM | POA: Diagnosis not present

## 2015-05-18 DIAGNOSIS — M1712 Unilateral primary osteoarthritis, left knee: Secondary | ICD-10-CM | POA: Diagnosis not present

## 2015-05-18 DIAGNOSIS — R7303 Prediabetes: Secondary | ICD-10-CM | POA: Diagnosis not present

## 2015-05-18 DIAGNOSIS — Z87440 Personal history of urinary (tract) infections: Secondary | ICD-10-CM | POA: Diagnosis not present

## 2015-05-18 DIAGNOSIS — F329 Major depressive disorder, single episode, unspecified: Secondary | ICD-10-CM | POA: Diagnosis not present

## 2015-05-18 DIAGNOSIS — Z96652 Presence of left artificial knee joint: Secondary | ICD-10-CM | POA: Diagnosis not present

## 2015-05-18 DIAGNOSIS — I10 Essential (primary) hypertension: Secondary | ICD-10-CM | POA: Diagnosis not present

## 2015-05-19 DIAGNOSIS — R7303 Prediabetes: Secondary | ICD-10-CM | POA: Diagnosis not present

## 2015-05-19 DIAGNOSIS — Z87440 Personal history of urinary (tract) infections: Secondary | ICD-10-CM | POA: Diagnosis not present

## 2015-05-19 DIAGNOSIS — Z471 Aftercare following joint replacement surgery: Secondary | ICD-10-CM | POA: Diagnosis not present

## 2015-05-19 DIAGNOSIS — M1712 Unilateral primary osteoarthritis, left knee: Secondary | ICD-10-CM | POA: Diagnosis not present

## 2015-05-19 DIAGNOSIS — Z8673 Personal history of transient ischemic attack (TIA), and cerebral infarction without residual deficits: Secondary | ICD-10-CM | POA: Diagnosis not present

## 2015-05-19 DIAGNOSIS — Z96652 Presence of left artificial knee joint: Secondary | ICD-10-CM | POA: Diagnosis not present

## 2015-05-19 DIAGNOSIS — I10 Essential (primary) hypertension: Secondary | ICD-10-CM | POA: Diagnosis not present

## 2015-05-19 DIAGNOSIS — F329 Major depressive disorder, single episode, unspecified: Secondary | ICD-10-CM | POA: Diagnosis not present

## 2015-05-19 DIAGNOSIS — M5481 Occipital neuralgia: Secondary | ICD-10-CM | POA: Diagnosis not present

## 2015-05-23 DIAGNOSIS — I10 Essential (primary) hypertension: Secondary | ICD-10-CM | POA: Diagnosis not present

## 2015-05-23 DIAGNOSIS — M1712 Unilateral primary osteoarthritis, left knee: Secondary | ICD-10-CM | POA: Diagnosis not present

## 2015-05-23 DIAGNOSIS — M5481 Occipital neuralgia: Secondary | ICD-10-CM | POA: Diagnosis not present

## 2015-05-23 DIAGNOSIS — Z471 Aftercare following joint replacement surgery: Secondary | ICD-10-CM | POA: Diagnosis not present

## 2015-05-23 DIAGNOSIS — Z87440 Personal history of urinary (tract) infections: Secondary | ICD-10-CM | POA: Diagnosis not present

## 2015-05-23 DIAGNOSIS — Z96652 Presence of left artificial knee joint: Secondary | ICD-10-CM | POA: Diagnosis not present

## 2015-05-23 DIAGNOSIS — Z8673 Personal history of transient ischemic attack (TIA), and cerebral infarction without residual deficits: Secondary | ICD-10-CM | POA: Diagnosis not present

## 2015-05-23 DIAGNOSIS — R7303 Prediabetes: Secondary | ICD-10-CM | POA: Diagnosis not present

## 2015-05-23 DIAGNOSIS — F329 Major depressive disorder, single episode, unspecified: Secondary | ICD-10-CM | POA: Diagnosis not present

## 2015-05-24 DIAGNOSIS — Z96652 Presence of left artificial knee joint: Secondary | ICD-10-CM | POA: Diagnosis not present

## 2015-05-24 DIAGNOSIS — Z471 Aftercare following joint replacement surgery: Secondary | ICD-10-CM | POA: Diagnosis not present

## 2015-05-25 DIAGNOSIS — Z87440 Personal history of urinary (tract) infections: Secondary | ICD-10-CM | POA: Diagnosis not present

## 2015-05-25 DIAGNOSIS — Z96652 Presence of left artificial knee joint: Secondary | ICD-10-CM | POA: Diagnosis not present

## 2015-05-25 DIAGNOSIS — R7303 Prediabetes: Secondary | ICD-10-CM | POA: Diagnosis not present

## 2015-05-25 DIAGNOSIS — M5481 Occipital neuralgia: Secondary | ICD-10-CM | POA: Diagnosis not present

## 2015-05-25 DIAGNOSIS — Z471 Aftercare following joint replacement surgery: Secondary | ICD-10-CM | POA: Diagnosis not present

## 2015-05-25 DIAGNOSIS — I10 Essential (primary) hypertension: Secondary | ICD-10-CM | POA: Diagnosis not present

## 2015-05-25 DIAGNOSIS — M1712 Unilateral primary osteoarthritis, left knee: Secondary | ICD-10-CM | POA: Diagnosis not present

## 2015-05-25 DIAGNOSIS — Z8673 Personal history of transient ischemic attack (TIA), and cerebral infarction without residual deficits: Secondary | ICD-10-CM | POA: Diagnosis not present

## 2015-05-25 DIAGNOSIS — F329 Major depressive disorder, single episode, unspecified: Secondary | ICD-10-CM | POA: Diagnosis not present

## 2015-05-29 DIAGNOSIS — Z471 Aftercare following joint replacement surgery: Secondary | ICD-10-CM | POA: Diagnosis not present

## 2015-05-29 DIAGNOSIS — R7303 Prediabetes: Secondary | ICD-10-CM | POA: Diagnosis not present

## 2015-05-29 DIAGNOSIS — Z87440 Personal history of urinary (tract) infections: Secondary | ICD-10-CM | POA: Diagnosis not present

## 2015-05-29 DIAGNOSIS — F329 Major depressive disorder, single episode, unspecified: Secondary | ICD-10-CM | POA: Diagnosis not present

## 2015-05-29 DIAGNOSIS — I10 Essential (primary) hypertension: Secondary | ICD-10-CM | POA: Diagnosis not present

## 2015-05-29 DIAGNOSIS — M1712 Unilateral primary osteoarthritis, left knee: Secondary | ICD-10-CM | POA: Diagnosis not present

## 2015-05-29 DIAGNOSIS — Z96652 Presence of left artificial knee joint: Secondary | ICD-10-CM | POA: Diagnosis not present

## 2015-05-29 DIAGNOSIS — Z8673 Personal history of transient ischemic attack (TIA), and cerebral infarction without residual deficits: Secondary | ICD-10-CM | POA: Diagnosis not present

## 2015-05-29 DIAGNOSIS — M5481 Occipital neuralgia: Secondary | ICD-10-CM | POA: Diagnosis not present

## 2015-05-31 DIAGNOSIS — Z96652 Presence of left artificial knee joint: Secondary | ICD-10-CM | POA: Diagnosis not present

## 2015-05-31 DIAGNOSIS — Z87440 Personal history of urinary (tract) infections: Secondary | ICD-10-CM | POA: Diagnosis not present

## 2015-05-31 DIAGNOSIS — F329 Major depressive disorder, single episode, unspecified: Secondary | ICD-10-CM | POA: Diagnosis not present

## 2015-05-31 DIAGNOSIS — Z471 Aftercare following joint replacement surgery: Secondary | ICD-10-CM | POA: Diagnosis not present

## 2015-05-31 DIAGNOSIS — Z8673 Personal history of transient ischemic attack (TIA), and cerebral infarction without residual deficits: Secondary | ICD-10-CM | POA: Diagnosis not present

## 2015-05-31 DIAGNOSIS — I10 Essential (primary) hypertension: Secondary | ICD-10-CM | POA: Diagnosis not present

## 2015-05-31 DIAGNOSIS — R7303 Prediabetes: Secondary | ICD-10-CM | POA: Diagnosis not present

## 2015-05-31 DIAGNOSIS — M1712 Unilateral primary osteoarthritis, left knee: Secondary | ICD-10-CM | POA: Diagnosis not present

## 2015-05-31 DIAGNOSIS — M5481 Occipital neuralgia: Secondary | ICD-10-CM | POA: Diagnosis not present

## 2015-06-05 DIAGNOSIS — M1712 Unilateral primary osteoarthritis, left knee: Secondary | ICD-10-CM | POA: Diagnosis not present

## 2015-06-05 DIAGNOSIS — Z471 Aftercare following joint replacement surgery: Secondary | ICD-10-CM | POA: Diagnosis not present

## 2015-06-05 DIAGNOSIS — F329 Major depressive disorder, single episode, unspecified: Secondary | ICD-10-CM | POA: Diagnosis not present

## 2015-06-05 DIAGNOSIS — Z8673 Personal history of transient ischemic attack (TIA), and cerebral infarction without residual deficits: Secondary | ICD-10-CM | POA: Diagnosis not present

## 2015-06-05 DIAGNOSIS — Z96652 Presence of left artificial knee joint: Secondary | ICD-10-CM | POA: Diagnosis not present

## 2015-06-05 DIAGNOSIS — Z87440 Personal history of urinary (tract) infections: Secondary | ICD-10-CM | POA: Diagnosis not present

## 2015-06-05 DIAGNOSIS — R7303 Prediabetes: Secondary | ICD-10-CM | POA: Diagnosis not present

## 2015-06-05 DIAGNOSIS — M5481 Occipital neuralgia: Secondary | ICD-10-CM | POA: Diagnosis not present

## 2015-06-05 DIAGNOSIS — I10 Essential (primary) hypertension: Secondary | ICD-10-CM | POA: Diagnosis not present

## 2015-06-08 DIAGNOSIS — R7303 Prediabetes: Secondary | ICD-10-CM | POA: Diagnosis not present

## 2015-06-08 DIAGNOSIS — F329 Major depressive disorder, single episode, unspecified: Secondary | ICD-10-CM | POA: Diagnosis not present

## 2015-06-08 DIAGNOSIS — M5481 Occipital neuralgia: Secondary | ICD-10-CM | POA: Diagnosis not present

## 2015-06-08 DIAGNOSIS — Z87440 Personal history of urinary (tract) infections: Secondary | ICD-10-CM | POA: Diagnosis not present

## 2015-06-08 DIAGNOSIS — M1712 Unilateral primary osteoarthritis, left knee: Secondary | ICD-10-CM | POA: Diagnosis not present

## 2015-06-08 DIAGNOSIS — Z471 Aftercare following joint replacement surgery: Secondary | ICD-10-CM | POA: Diagnosis not present

## 2015-06-08 DIAGNOSIS — Z8673 Personal history of transient ischemic attack (TIA), and cerebral infarction without residual deficits: Secondary | ICD-10-CM | POA: Diagnosis not present

## 2015-06-08 DIAGNOSIS — I10 Essential (primary) hypertension: Secondary | ICD-10-CM | POA: Diagnosis not present

## 2015-06-08 DIAGNOSIS — Z96652 Presence of left artificial knee joint: Secondary | ICD-10-CM | POA: Diagnosis not present

## 2015-06-14 DIAGNOSIS — Z8673 Personal history of transient ischemic attack (TIA), and cerebral infarction without residual deficits: Secondary | ICD-10-CM | POA: Diagnosis not present

## 2015-06-14 DIAGNOSIS — M1712 Unilateral primary osteoarthritis, left knee: Secondary | ICD-10-CM | POA: Diagnosis not present

## 2015-06-14 DIAGNOSIS — Z471 Aftercare following joint replacement surgery: Secondary | ICD-10-CM | POA: Diagnosis not present

## 2015-06-14 DIAGNOSIS — M5481 Occipital neuralgia: Secondary | ICD-10-CM | POA: Diagnosis not present

## 2015-06-14 DIAGNOSIS — Z96652 Presence of left artificial knee joint: Secondary | ICD-10-CM | POA: Diagnosis not present

## 2015-06-14 DIAGNOSIS — F329 Major depressive disorder, single episode, unspecified: Secondary | ICD-10-CM | POA: Diagnosis not present

## 2015-06-14 DIAGNOSIS — Z87440 Personal history of urinary (tract) infections: Secondary | ICD-10-CM | POA: Diagnosis not present

## 2015-06-14 DIAGNOSIS — I10 Essential (primary) hypertension: Secondary | ICD-10-CM | POA: Diagnosis not present

## 2015-06-14 DIAGNOSIS — R7303 Prediabetes: Secondary | ICD-10-CM | POA: Diagnosis not present

## 2015-06-16 ENCOUNTER — Ambulatory Visit (INDEPENDENT_AMBULATORY_CARE_PROVIDER_SITE_OTHER): Payer: Commercial Managed Care - HMO | Admitting: Internal Medicine

## 2015-06-16 VITALS — BP 136/82 | HR 78 | Temp 98.0°F | Resp 18 | Ht 64.0 in | Wt 159.0 lb

## 2015-06-16 DIAGNOSIS — R7309 Other abnormal glucose: Secondary | ICD-10-CM | POA: Diagnosis not present

## 2015-06-16 DIAGNOSIS — R7303 Prediabetes: Secondary | ICD-10-CM | POA: Diagnosis not present

## 2015-06-16 DIAGNOSIS — G4700 Insomnia, unspecified: Secondary | ICD-10-CM | POA: Diagnosis not present

## 2015-06-16 DIAGNOSIS — I10 Essential (primary) hypertension: Secondary | ICD-10-CM | POA: Diagnosis not present

## 2015-06-16 DIAGNOSIS — E559 Vitamin D deficiency, unspecified: Secondary | ICD-10-CM | POA: Diagnosis not present

## 2015-06-16 DIAGNOSIS — Z6827 Body mass index (BMI) 27.0-27.9, adult: Secondary | ICD-10-CM

## 2015-06-16 DIAGNOSIS — E785 Hyperlipidemia, unspecified: Secondary | ICD-10-CM

## 2015-06-16 DIAGNOSIS — Z79899 Other long term (current) drug therapy: Secondary | ICD-10-CM

## 2015-06-16 DIAGNOSIS — M62838 Other muscle spasm: Secondary | ICD-10-CM

## 2015-06-16 DIAGNOSIS — M6249 Contracture of muscle, multiple sites: Secondary | ICD-10-CM

## 2015-06-16 DIAGNOSIS — Z23 Encounter for immunization: Secondary | ICD-10-CM

## 2015-06-16 DIAGNOSIS — E039 Hypothyroidism, unspecified: Secondary | ICD-10-CM | POA: Diagnosis not present

## 2015-06-16 DIAGNOSIS — E662 Morbid (severe) obesity with alveolar hypoventilation: Secondary | ICD-10-CM | POA: Diagnosis not present

## 2015-06-16 DIAGNOSIS — K219 Gastro-esophageal reflux disease without esophagitis: Secondary | ICD-10-CM

## 2015-06-16 LAB — HEPATIC FUNCTION PANEL
ALT: 15 U/L (ref 6–29)
AST: 20 U/L (ref 10–35)
Albumin: 4.1 g/dL (ref 3.6–5.1)
Alkaline Phosphatase: 79 U/L (ref 33–130)
Bilirubin, Direct: 0.1 mg/dL (ref <=0.2)
Indirect Bilirubin: 0.4 mg/dL (ref 0.2–1.2)
Total Bilirubin: 0.5 mg/dL (ref 0.2–1.2)
Total Protein: 6.7 g/dL (ref 6.1–8.1)

## 2015-06-16 LAB — CBC WITH DIFFERENTIAL/PLATELET
Basophils Absolute: 0 10*3/uL (ref 0.0–0.1)
Basophils Relative: 0 % (ref 0–1)
Eosinophils Absolute: 0.1 10*3/uL (ref 0.0–0.7)
Eosinophils Relative: 3 % (ref 0–5)
HCT: 41 % (ref 36.0–46.0)
Hemoglobin: 13.7 g/dL (ref 12.0–15.0)
Lymphocytes Relative: 24 % (ref 12–46)
Lymphs Abs: 1.1 10*3/uL (ref 0.7–4.0)
MCH: 31.4 pg (ref 26.0–34.0)
MCHC: 33.4 g/dL (ref 30.0–36.0)
MCV: 94 fL (ref 78.0–100.0)
MPV: 10.3 fL (ref 8.6–12.4)
Monocytes Absolute: 0.4 10*3/uL (ref 0.1–1.0)
Monocytes Relative: 8 % (ref 3–12)
Neutro Abs: 3 10*3/uL (ref 1.7–7.7)
Neutrophils Relative %: 65 % (ref 43–77)
Platelets: 192 10*3/uL (ref 150–400)
RBC: 4.36 MIL/uL (ref 3.87–5.11)
RDW: 14.3 % (ref 11.5–15.5)
WBC: 4.6 10*3/uL (ref 4.0–10.5)

## 2015-06-16 LAB — BASIC METABOLIC PANEL WITH GFR
BUN: 20 mg/dL (ref 7–25)
CO2: 23 mmol/L (ref 20–31)
Calcium: 9.8 mg/dL (ref 8.6–10.4)
Chloride: 105 mmol/L (ref 98–110)
Creat: 0.84 mg/dL (ref 0.60–0.93)
GFR, Est African American: 81 mL/min (ref >=60)
GFR, Est Non African American: 71 mL/min (ref >=60)
Glucose, Bld: 96 mg/dL (ref 65–99)
Potassium: 4.2 mmol/L (ref 3.5–5.3)
Sodium: 141 mmol/L (ref 135–146)

## 2015-06-16 LAB — LIPID PANEL
Cholesterol: 140 mg/dL (ref 125–200)
HDL: 53 mg/dL (ref >=46)
LDL Cholesterol: 57 mg/dL (ref <130)
Total CHOL/HDL Ratio: 2.6 Ratio (ref <=5.0)
Triglycerides: 149 mg/dL (ref <150)
VLDL: 30 mg/dL (ref <30)

## 2015-06-16 LAB — TSH: TSH: 0.291 u[IU]/mL — ABNORMAL LOW (ref 0.350–4.500)

## 2015-06-16 LAB — HEMOGLOBIN A1C
Hgb A1c MFr Bld: 5 % (ref <5.7)
Mean Plasma Glucose: 97 mg/dL (ref <117)

## 2015-06-16 LAB — MAGNESIUM: Magnesium: 1.7 mg/dL (ref 1.5–2.5)

## 2015-06-16 MED ORDER — HYDROXYZINE PAMOATE 100 MG PO CAPS: ORAL_CAPSULE | ORAL | Status: DC

## 2015-06-16 MED ORDER — METHOCARBAMOL 500 MG PO TABS: ORAL_TABLET | ORAL | Status: DC

## 2015-06-16 NOTE — Patient Instructions (Signed)

## 2015-06-17 ENCOUNTER — Encounter: Payer: Self-pay | Admitting: Internal Medicine

## 2015-06-17 DIAGNOSIS — Z6827 Body mass index (BMI) 27.0-27.9, adult: Secondary | ICD-10-CM | POA: Insufficient documentation

## 2015-06-17 DIAGNOSIS — E663 Overweight: Secondary | ICD-10-CM | POA: Insufficient documentation

## 2015-06-17 LAB — INSULIN, RANDOM: Insulin: 23.4 u[IU]/mL — ABNORMAL HIGH (ref 2.0–19.6)

## 2015-06-17 LAB — VITAMIN D 25 HYDROXY (VIT D DEFICIENCY, FRACTURES): Vit D, 25-Hydroxy: 48 ng/mL (ref 30–100)

## 2015-06-17 NOTE — Progress Notes (Signed)
Patient ID: Kimberly Cook, female   DOB: [birth year], 69 y.o.   MRN: [id number]   This very nice 69 y.o. [facility] presents for  follow up with Hypertension, Hyperlipidemia, Pre-Diabetes and Vitamin D Deficiency.    Patient is treated for HTN & BP has been controlled at home. Today's BP: 136/82 mmHg. Patient has had no complaints of any cardiac type chest pain, palpitations, dyspnea/orthopnea/PND, dizziness, claudication, or dependent edema.   Hyperlipidemia is controlled with diet & meds. Patient denies myalgias or other med SE's. Last Lipids were at goal with Cholesterol 140; HDL 53; LDL 57; Triglycerides 149 on 06/16/2015.   Also, the patient has history of PreDiabetes and has had no symptoms of reactive hypoglycemia, diabetic polys, paresthesias or visual blurring.  Last A1c was 5.0% on 06/16/2015.   Further, the patient also has history of Vitamin D Deficiency and supplements vitamin D without any suspected side-effects. Last vitamin D was 48 on 06/16/2015.   Medication Sig   ASA 325 mg Take 1 daily  . Biotin 1 MG CAPS Take 5,000 mcg by mouth.   Marland Kitchen VITAMIN D 2000 UNITS  Take 6,000 Units by mouth daily.  Marland Kitchen VITAMIN B-12 5000 MCG SL Take 5,000 mcg by mouth daily.  Marland Kitchen COLACE 100 MG capsule Take 1 capsule (100 mg total) by mouth 2 (two) times daily.  Awanda Mink 7.5-325  Take 1-2 tablets by mouth every 4 (four) hours as needed for moderate pain.  Marland Kitchen levothyroxine  112 MCG tablet Take 1 tablet (112 mcg total) by mouth daily.  Marland Kitchen lisinopril 20 MG tablet TAKE 1 TABLET EVERY DAY FOR BLOOD PRESSURE  . MAGNESIUM OXIDE  Take 1 capsule by mouth daily. 400 mg  . omeprazole  40 MG capsule TAKE 1 CAPSULE EVERY DAY  . simvastatin (ZOCOR) 40 MG tablet Take 1 tablet (40 mg total) by mouth every evening.  . methocarbamol (ROBAXIN) 500 MG  Take 500 mg by mouth every 6 (six) hours as needed for muscle spasms.  . calcium carbonate (OS-CAL) 600 MG  Take 600 mg by mouth daily with breakfast.  . ferrous sulfate 325 (65 FE)  MG tablet Take 1 tablet (325 mg total) by mouth 3 (three) times daily after meals.  Marland Kitchen MIRALAX  Take 17 g by mouth 2 (two) times daily.  . promethazine (PHENERGAN) 12.5 MG  Take 1 tablet (12.5 mg total) by mouth every 6 (six) hours as needed for nausea or vomiting.  . rivaroxaban (XARELTO) 10 MG TABS  Take 1 tablet (10 mg total) by mouth daily.  . valACYclovir (VALTREX) 1000 MG  Take 1,000 mg by mouth 3 (three) times daily. X 7 days starting 04/25/15   No Known Allergies  PMHx:   Past Medical History  Diagnosis Date  . Hyperlipidemia   . GERD (gastroesophageal reflux disease)   . Arthritis   . Depression   . Vitamin D deficiency   . Prediabetes   . Occipital neuralgia   . Hypertension     Normal cardiolite 05/2006 EF 71%  . Mini stroke 2011   . Hypothyroidism   . Urinary tract infection     hx of   . Headache    Immunization History  Administered Date(s) Administered  . Influenza, High Dose Seasonal PF 06/16/2015  . Influenza-Unspecified 04/18/2013, 04/05/2014  . PPD Test 04/19/2015  . Pneumococcal Conjugate-13 06/16/2015  . Pneumococcal-Unspecified 08/18/2010  . Td 08/18/2005  . Zoster 08/18/2005   Past Surgical History  Procedure Laterality Date  .  Breast biopsy Right over 20 years     benign  . Cholecystectomy    . Bladder surgery    . Tonsillectomy and adenoidectomy    . Knee arthroscopy Left 2011  . Abdominal hysterectomy    . Gastroplication     . Total knee arthroplasty Left 04/17/2015    Procedure: LEFT TOTAL KNEE ARTHROPLASTY;  Surgeon: Durene Romans, MD;  Location: [facility];  Service: Orthopedics;  Laterality: Left;   FHx:    Reviewed / unchanged  SHx:    Reviewed / unchanged  Systems Review:  Constitutional: Denies fever, chills, wt changes, headaches, insomnia, fatigue, night sweats, change in appetite. Eyes: Denies redness, blurred vision, diplopia, discharge, itchy, watery eyes.  ENT: Denies discharge, congestion, post nasal drip, epistaxis, sore  throat, earache, hearing loss, dental pain, tinnitus, vertigo, sinus pain, snoring.  CV: Denies chest pain, palpitations, irregular heartbeat, syncope, dyspnea, diaphoresis, orthopnea, PND, claudication or edema. Respiratory: denies cough, dyspnea, DOE, pleurisy, hoarseness, laryngitis, wheezing.  Gastrointestinal: Denies dysphagia, odynophagia, heartburn, reflux, water brash, abdominal pain or cramps, nausea, vomiting, bloating, diarrhea, constipation, hematemesis, melena, hematochezia  or hemorrhoids. Genitourinary: Denies dysuria, frequency, urgency, nocturia, hesitancy, discharge, hematuria or flank pain. Musculoskeletal: Denies arthralgias, myalgias, stiffness, jt. swelling, pain, limping or strain/sprain.  Skin: Denies pruritus, rash, hives, warts, acne, eczema or change in skin lesion(s). Neuro: No weakness, tremor, incoordination, spasms, paresthesia or pain. Psychiatric: Denies confusion, memory loss or sensory loss. c/o Insomnia .  Endo: Denies change in weight, skin or hair change.  Heme/Lymph: No excessive bleeding, bruising or enlarged lymph nodes.  Physical Exam  BP 136/82 mmHg  Pulse 78  Temp(Src) 98 F (36.7 C) (Temporal)  Resp 18  Ht 5\' 4"  (1.626 m)  Wt 159 lb (72.122 kg)  BMI 27.28 kg/m2  Appears well nourished and in no distress. Eyes: PERRLA, EOMs, conjunctiva no swelling or erythema. Sinuses: No frontal/maxillary tenderness ENT/Mouth: EAC's clear, TM's nl w/o erythema, bulging. Nares clear w/o erythema, swelling, exudates. Oropharynx clear without erythema or exudates. Oral hygiene is good. Tongue normal, non obstructing. Hearing intact.  Neck: Supple. Thyroid nl. Car 2+/2+ without bruits, nodes or JVD. Chest: Respirations nl with BS clear & equal w/o rales, rhonchi, wheezing or stridor.  Cor: Heart sounds normal w/ regular rate and rhythm without sig. murmurs, gallops, clicks, or rubs. Peripheral pulses normal and equal  without edema.  Abdomen: Soft & bowel sounds  normal. Non-tender w/o guarding, rebound, hernias, masses, or organomegaly.  Lymphatics: Unremarkable.  Musculoskeletal: Full ROM all peripheral extremities, joint stability, 5/5 strength, and normal gait.  Skin: Warm, dry without exposed rashes, lesions or ecchymosis apparent.  Neuro: Cranial nerves intact, reflexes equal bilaterally. Sensory-motor testing grossly intact. Tendon reflexes grossly intact.  Pysch: Alert & oriented x 3.  Insight and judgement nl & appropriate. No ideations.  Assessment and Plan:  1. Essential hypertension  - TSH  2. Hyperlipidemia  - Lipid panel  3. Prediabetes  - Hemoglobin A1c - Insulin, random  4. Vitamin D deficiency  - Vit D  25 hydroxy   5. Gastroesophageal reflux disease   6. Hypothyroidism, unspecified hypothyroidism type   7. Morbid obesity with alveolar hypoventilation (HCC)   8. Medication management  - CBC with Differential/Platelet - BASIC METABOLIC PANEL WITH GFR - Hepatic function panel - Magnesium  9. Muscle spasms of lower extremity, unspecified laterality  - methocarbamol (ROBAXIN) 500 MG tablet; Take 1 tablet 3 x day if needed for muscle spasms  Dispense: 90 tablet; Refill: 1  10. Insomnia  - hydrOXYzine (VISTARIL) 100 MG capsule; Take 1 hour before sleep  Dispense: 30 capsule; Refill: 2  11. Need for prophylactic vaccination and inoculation against influenza  - Flu vaccine HIGH DOSE PF (Fluzone High dose)  12. Need for prophylactic vaccination against Streptococcus pneumoniae (pneumococcus)  - Pneumococcal conjugate vaccine 13-valent  13. BMI 27.0-27.9,adult   Recommended regular exercise, BP monitoring, weight control, and discussed med and SE's. Recommended labs to assess and monitor clinical status. Further disposition pending results of labs. Over 30 minutes of exam, counseling, chart review was performed

## 2015-06-25 ENCOUNTER — Encounter: Payer: Self-pay | Admitting: *Deleted

## 2015-06-27 ENCOUNTER — Telehealth: Payer: Self-pay | Admitting: *Deleted

## 2015-06-27 NOTE — Telephone Encounter (Signed)
Pt aware of lab results and changes she should make.

## 2015-07-18 ENCOUNTER — Other Ambulatory Visit: Payer: Self-pay | Admitting: Physician Assistant

## 2015-07-24 ENCOUNTER — Other Ambulatory Visit: Payer: Self-pay | Admitting: Internal Medicine

## 2015-07-24 DIAGNOSIS — G4700 Insomnia, unspecified: Secondary | ICD-10-CM

## 2015-07-24 MED ORDER — TRAZODONE HCL 150 MG PO TABS: ORAL_TABLET | ORAL | Status: DC

## 2015-08-03 ENCOUNTER — Telehealth: Payer: Self-pay | Admitting: *Deleted

## 2015-08-03 NOTE — Telephone Encounter (Signed)
Patient called and states she has had itching all over her body since starting Trazodone.  Per Dr Oneta Rack, stop the Trazodone and try Zyrtec for itching.  Patient advised.

## 2015-08-07 ENCOUNTER — Other Ambulatory Visit: Payer: Self-pay | Admitting: Internal Medicine

## 2015-08-08 ENCOUNTER — Other Ambulatory Visit: Payer: Self-pay | Admitting: Internal Medicine

## 2015-08-08 DIAGNOSIS — G4700 Insomnia, unspecified: Secondary | ICD-10-CM

## 2015-08-08 MED ORDER — HYDROXYZINE HCL 50 MG PO TABS: ORAL_TABLET | ORAL | Status: DC

## 2015-08-09 ENCOUNTER — Other Ambulatory Visit: Payer: Self-pay | Admitting: *Deleted

## 2015-08-09 MED ORDER — LORAZEPAM 1 MG PO TABS: ORAL_TABLET | ORAL | Status: DC

## 2015-08-23 ENCOUNTER — Emergency Department (HOSPITAL_COMMUNITY)
Admission: EM | Admit: 2015-08-23 | Discharge: 2015-08-23 | Disposition: A | Discharge status: Home / Self Care | Payer: Commercial Managed Care - HMO | Attending: Emergency Medicine | Admitting: Emergency Medicine

## 2015-08-23 ENCOUNTER — Emergency Department (HOSPITAL_COMMUNITY): Payer: Commercial Managed Care - HMO

## 2015-08-23 ENCOUNTER — Encounter (HOSPITAL_COMMUNITY): Payer: Self-pay

## 2015-08-23 DIAGNOSIS — I10 Essential (primary) hypertension: Secondary | ICD-10-CM | POA: Diagnosis not present

## 2015-08-23 DIAGNOSIS — K219 Gastro-esophageal reflux disease without esophagitis: Secondary | ICD-10-CM | POA: Insufficient documentation

## 2015-08-23 DIAGNOSIS — Z8673 Personal history of transient ischemic attack (TIA), and cerebral infarction without residual deficits: Secondary | ICD-10-CM | POA: Diagnosis not present

## 2015-08-23 DIAGNOSIS — Z87440 Personal history of urinary (tract) infections: Secondary | ICD-10-CM | POA: Insufficient documentation

## 2015-08-23 DIAGNOSIS — E039 Hypothyroidism, unspecified: Secondary | ICD-10-CM | POA: Insufficient documentation

## 2015-08-23 DIAGNOSIS — E559 Vitamin D deficiency, unspecified: Secondary | ICD-10-CM | POA: Diagnosis not present

## 2015-08-23 DIAGNOSIS — Y998 Other external cause status: Secondary | ICD-10-CM | POA: Diagnosis not present

## 2015-08-23 DIAGNOSIS — E785 Hyperlipidemia, unspecified: Secondary | ICD-10-CM | POA: Insufficient documentation

## 2015-08-23 DIAGNOSIS — Y9389 Activity, other specified: Secondary | ICD-10-CM | POA: Insufficient documentation

## 2015-08-23 DIAGNOSIS — Z7982 Long term (current) use of aspirin: Secondary | ICD-10-CM | POA: Diagnosis not present

## 2015-08-23 DIAGNOSIS — R079 Chest pain, unspecified: Secondary | ICD-10-CM | POA: Diagnosis not present

## 2015-08-23 DIAGNOSIS — S20212A Contusion of left front wall of thorax, initial encounter: Secondary | ICD-10-CM | POA: Insufficient documentation

## 2015-08-23 DIAGNOSIS — Z79899 Other long term (current) drug therapy: Secondary | ICD-10-CM | POA: Insufficient documentation

## 2015-08-23 DIAGNOSIS — S20302A Unspecified superficial injuries of left front wall of thorax, initial encounter: Secondary | ICD-10-CM | POA: Diagnosis present

## 2015-08-23 DIAGNOSIS — W182XXA Fall in (into) shower or empty bathtub, initial encounter: Secondary | ICD-10-CM | POA: Diagnosis not present

## 2015-08-23 DIAGNOSIS — Y9289 Other specified places as the place of occurrence of the external cause: Secondary | ICD-10-CM | POA: Diagnosis not present

## 2015-08-23 DIAGNOSIS — M1990 Unspecified osteoarthritis, unspecified site: Secondary | ICD-10-CM | POA: Diagnosis not present

## 2015-08-23 MED ORDER — OXYCODONE-ACETAMINOPHEN 5-325 MG PO TABS
ORAL_TABLET | ORAL | Status: AC
  Filled 2015-08-23: qty 1

## 2015-08-23 MED ORDER — OXYCODONE-ACETAMINOPHEN 5-325 MG PO TABS: 1.0000 | ORAL_TABLET | Freq: Once | ORAL | Status: DC

## 2015-08-23 MED ORDER — OXYCODONE-ACETAMINOPHEN 5-325 MG PO TABS
1.0000 | ORAL_TABLET | Freq: Once | ORAL | Status: AC
  Administered 2015-08-23: 1 via ORAL

## 2015-08-23 NOTE — Discharge Instructions (Signed)
Chest Contusion A chest contusion is a deep bruise on your chest area. Contusions are the result of an injury that caused bleeding under the skin. A chest contusion may involve bruising of the skin, muscles, or ribs. The contusion may turn blue, purple, or yellow. Minor injuries will give you a painless contusion, but more severe contusions may stay painful and swollen for a few weeks. CAUSES  A contusion is usually caused by a blow, trauma, or direct force to an area of the body. SYMPTOMS   Swelling and redness of the injured area.  Discoloration of the injured area.  Tenderness and soreness of the injured area.  Pain. DIAGNOSIS  The diagnosis can be made by taking a history and performing a physical exam. An X-ray, CT scan, or MRI may be needed to determine if there were any associated injuries, such as broken bones (fractures) or internal injuries. TREATMENT  Often, the best treatment for a chest contusion is resting, icing, and applying cold compresses to the injured area. Deep breathing exercises may be recommended to reduce the risk of pneumonia. Over-the-counter medicines may also be recommended for pain control. HOME CARE INSTRUCTIONS   Put ice on the injured area.  Put ice in a plastic bag.  Place a towel between your skin and the bag.  Leave the ice on for 15-20 minutes, 03-04 times a day.  Only take over-the-counter or prescription medicines as directed by your caregiver. Your caregiver may recommend avoiding anti-inflammatory medicines (aspirin, ibuprofen, and naproxen) for 48 hours because these medicines may increase bruising.  Rest the injured area.  Perform deep-breathing exercises as directed by your caregiver.  Stop smoking if you smoke.  Do not lift objects over 5 pounds (2.3 kg) for 3 days or longer if recommended by your caregiver. SEEK IMMEDIATE MEDICAL CARE IF:   You have increased bruising or swelling.  You have pain that is getting worse.  You have  difficulty breathing.  You have dizziness, weakness, or fainting.  You have blood in your urine or stool.  You cough up or vomit blood.  Your swelling or pain is not relieved with medicines. MAKE SURE YOU:   Understand these instructions.  Will watch your condition.  Will get help right away if you are not doing well or get worse.   This information is not intended to replace advice given to you by your health care provider. Make sure you discuss any questions you have with your health care provider.   Document Released: 04/16/2001 Document Revised: 04/15/2012 Document Reviewed: 01/13/2012 Elsevier Interactive Patient Education 2016 [employer]  

## 2015-08-23 NOTE — ED Provider Notes (Signed)
CSN: [id number]     Arrival date & time 08/23/15  1614 History   First MD Initiated Contact with Patient 08/23/15 2110     Chief Complaint  Patient presents with  . Fall     (Consider location/radiation/quality/duration/timing/severity/associated sxs/prior Treatment) HPI Patient is a 70 year old female past medical history as documented below who presents today with lower left sided chest pain that started after a fall in the bathtub earlier today. Patient relates that she had left knee replacement 4 months ago and since then which she shaves her legs she has to get down to the bathtub. States she was doing this today and was wearing her night gown and she believes she got tripped up on her nightgown and fell forward onto the ledge the bathtub. States that since that she's had moderate pain in her left lower chest wall. States it hurts to take deep breaths. Patient states that she landed with her left hand outstretched but she has no pain in her wrist or hand. She denies loss of consciousness with this. She denies any head injury with this. She is not on any anticoagulation. Worsening factors include taking deep breaths as noted above. No clear alleviating factors. Patient is not had any nausea, vomiting, confusion, abdominal pain. Denies any further injuries.    Past Medical History  Diagnosis Date  . Hyperlipidemia   . GERD (gastroesophageal reflux disease)   . Arthritis   . Depression   . Vitamin D deficiency   . Prediabetes   . Occipital neuralgia   . Hypertension     Normal cardiolite 05/2006 EF 71%  . Mini stroke (HCC) 2011   . Hypothyroidism   . Urinary tract infection     hx of   . Headache    Past Surgical History  Procedure Laterality Date  . Breast biopsy Right over 20 years     benign  . Cholecystectomy    . Bladder surgery    . Tonsillectomy and adenoidectomy    . Knee arthroscopy Left 2011  . Abdominal hysterectomy    . Gastroplication     . Total knee  arthroplasty Left 04/17/2015    Procedure: LEFT TOTAL KNEE ARTHROPLASTY;  Surgeon: Durene Romans, MD;  Location: [facility];  Service: Orthopedics;  Laterality: Left;   Family History  Problem Relation Age of Onset  . Heart disease Mother   . Hypertension Mother   . Diabetes Mother   . Heart disease Father   . Cancer Maternal Aunt     breast  . Stroke Neg Hx    Social History  Substance Use Topics  . Smoking status: Never Smoker   . Smokeless tobacco: Never Used  . Alcohol Use: No   OB History    No data available     Review of Systems  HENT: Negative for facial swelling and nosebleeds.   Respiratory: Negative for cough, chest tightness and shortness of breath.   Cardiovascular: Positive for chest pain (with deep breaths at area of injury).  Gastrointestinal: Negative for nausea, vomiting and abdominal pain.  Psychiatric/Behavioral: Negative for confusion and agitation.  All other systems reviewed and are negative.     Allergies  Review of patient's allergies indicates no known allergies.  Home Medications   Prior to Admission medications   Medication Sig Start Date End Date Taking? Authorizing Provider  aspirin 325 MG tablet Take 325 mg by mouth daily.   Yes Historical Provider, MD  Biotin 1 MG CAPS Take 1 mg  by mouth daily.    Yes Historical Provider, MD  Calcium Carb-Cholecalciferol (CALCIUM-VITAMIN D) 600-400 MG-UNIT TABS Take 1 tablet by mouth daily.   Yes Historical Provider, MD  Cholecalciferol (VITAMIN D3) 2000 UNITS capsule Take 6,000 Units by mouth daily.   Yes Historical Provider, MD  docusate sodium (COLACE) 100 MG capsule Take 1 capsule (100 mg total) by mouth 2 (two) times daily. Patient taking differently: Take 100 mg by mouth daily as needed for mild constipation or moderate constipation.  04/19/15  Yes Lanney Gins, PA-C  levothyroxine (SYNTHROID, LEVOTHROID) 112 MCG tablet Take 1 tablet (112 mcg total) by mouth daily. 03/22/15  Yes Lucky Cowboy, MD   lisinopril (PRINIVIL,ZESTRIL) 20 MG tablet TAKE 1 TABLET EVERY DAY FOR BLOOD PRESSURE 07/18/15  Yes Courtney Forcucci, PA-C  LORazepam (ATIVAN) 1 MG tablet Take 1/2 to 1 tablet at bedtime for sleep. Patient taking differently: Take 1 mg by mouth at bedtime. Take 1/2 to 1 tablet at bedtime for sleep. 08/09/15  Yes Lucky Cowboy, MD  MAGNESIUM OXIDE PO Take 400 mg by mouth daily. 400 mg   Yes Historical Provider, MD  methocarbamol (ROBAXIN) 500 MG tablet TAKE 1 TABLET THREE TIMES DAILY IF NEEDED FOR MUSCLE SPASMS 08/08/15  Yes Courtney Forcucci, PA-C  omeprazole (PRILOSEC) 40 MG capsule TAKE 1 CAPSULE EVERY DAY Patient taking differently: Take 40 mg by mouth daily as needed for heartburn/indigestion 05/18/15  Yes Quentin Mulling, PA-C  simvastatin (ZOCOR) 40 MG tablet Take 1 tablet (40 mg total) by mouth every evening. 03/22/15 03/21/16 Yes Lucky Cowboy, MD  oxyCODONE-acetaminophen (PERCOCET/ROXICET) 5-325 MG tablet Take 1 tablet by mouth once. 08/23/15   Madolyn Frieze, MD   BP 124/87 mmHg  Pulse 53  Temp(Src) 98.6 F (37 C) (Oral)  Resp 16  SpO2 99% Physical Exam  Constitutional: She is oriented to person, place, and time. No distress.  HENT:  Head: Normocephalic and atraumatic.  Eyes: Conjunctivae and EOM are normal.  Neck: Normal range of motion. Neck supple.  Cardiovascular: Normal rate and normal heart sounds.   Pulmonary/Chest: Effort normal and breath sounds normal. No respiratory distress. She exhibits tenderness (moderate, L lower chest wall. No obvious ecchymosis).  Abdominal: Soft. She exhibits no distension. There is no tenderness.  Musculoskeletal: She exhibits tenderness (to L lower chest wall -- none noted to L wrist or snuffbox). She exhibits no edema.  Neurological: She is alert and oriented to person, place, and time.  Skin: Skin is warm and dry. She is not diaphoretic.  Psychiatric: She has a normal mood and affect. Her behavior is normal.  Nursing note and vitals  reviewed.   ED Course  Procedures (including critical care time) Labs Review Labs Reviewed - No data to display  Imaging Review Dg Ribs Unilateral W/chest Left  08/23/2015  CLINICAL DATA:  Left anterior chest pain after falling last night. Initial encounter. EXAM: LEFT RIBS AND CHEST - 3+ VIEW COMPARISON:  Chest radiographs 01/11/2014 and 02/29/2012. FINDINGS: The heart size and mediastinal contours are stable. There is mild aortic tortuosity and a small hiatal hernia. Surgical clips are noted in the epigastric region. The lungs are clear. There is no pleural effusion or pneumothorax. Left rib detail images demonstrate no acute fracture or focal rib lesion. A metallic BB was placed over the area of pain in the left upper abdomen. The ribs in this area are not well visualized due to overlap with the abdominal contents. Thoracolumbar scoliosis and spondylosis noted. IMPRESSION: No evidence of acute rib  fracture, pleural effusion or pneumothorax. Scoliosis. Electronically Signed   By: Carey Bullocks M.D.   On: 08/23/2015 18:53   I have personally reviewed and evaluated these images and lab results as part of my medical decision-making.   EKG Interpretation None      MDM   Final diagnoses:  Rib contusion, left, initial encounter    Patient is a 70 year old female past medical history as noted above. She presents today with left-sided chest wall pain that occurred after a fall at home today. Obtained plain films of the chest wall which do not show any obvious fractures. No evident bruising is noted on inspection. Vital signs have been within normal limits since arrival. She has breath sounds present on auscultation. Given these reassuring findings we'll discharge the patient home with incentive spirometry instructions and a short course of pain medication. This was discussed with the patient who is in agreement with this. Patient is in good condition at the time of discharge.    Madolyn Frieze, MD 08/23/15 3532  Dione Booze, MD 08/24/15 [phone]

## 2015-08-23 NOTE — ED Notes (Addendum)
Pt had mechanical fall last night. She was sitting on side of garden tub and somehow managed to fall onto the floor and caught herself with her left fist. When she landed the left hand hit her left ribs and she reports rib pain from this. She also reports left leg pain. She is ambulatory. She does not take blood thinners. Denies LOC, denies head injury.

## 2015-09-11 ENCOUNTER — Other Ambulatory Visit: Payer: Self-pay | Admitting: Internal Medicine

## 2015-09-18 ENCOUNTER — Encounter: Payer: Self-pay | Admitting: Internal Medicine

## 2015-09-26 ENCOUNTER — Ambulatory Visit (INDEPENDENT_AMBULATORY_CARE_PROVIDER_SITE_OTHER): Payer: Commercial Managed Care - HMO | Admitting: Internal Medicine

## 2015-09-26 ENCOUNTER — Encounter: Payer: Self-pay | Admitting: Internal Medicine

## 2015-09-26 VITALS — BP 126/80 | Temp 98.2°F | Resp 16 | Ht 63.75 in | Wt 166.0 lb

## 2015-09-26 DIAGNOSIS — F329 Major depressive disorder, single episode, unspecified: Secondary | ICD-10-CM

## 2015-09-26 DIAGNOSIS — Z23 Encounter for immunization: Secondary | ICD-10-CM | POA: Diagnosis not present

## 2015-09-26 DIAGNOSIS — R7309 Other abnormal glucose: Secondary | ICD-10-CM | POA: Diagnosis not present

## 2015-09-26 DIAGNOSIS — Z79899 Other long term (current) drug therapy: Secondary | ICD-10-CM

## 2015-09-26 DIAGNOSIS — Z0000 Encounter for general adult medical examination without abnormal findings: Secondary | ICD-10-CM

## 2015-09-26 DIAGNOSIS — Z96659 Presence of unspecified artificial knee joint: Secondary | ICD-10-CM

## 2015-09-26 DIAGNOSIS — D539 Nutritional anemia, unspecified: Secondary | ICD-10-CM

## 2015-09-26 DIAGNOSIS — R7303 Prediabetes: Secondary | ICD-10-CM | POA: Diagnosis not present

## 2015-09-26 DIAGNOSIS — E662 Morbid (severe) obesity with alveolar hypoventilation: Secondary | ICD-10-CM

## 2015-09-26 DIAGNOSIS — E559 Vitamin D deficiency, unspecified: Secondary | ICD-10-CM | POA: Diagnosis not present

## 2015-09-26 DIAGNOSIS — F32A Depression, unspecified: Secondary | ICD-10-CM

## 2015-09-26 DIAGNOSIS — I10 Essential (primary) hypertension: Secondary | ICD-10-CM

## 2015-09-26 DIAGNOSIS — E039 Hypothyroidism, unspecified: Secondary | ICD-10-CM | POA: Diagnosis not present

## 2015-09-26 DIAGNOSIS — Z6827 Body mass index (BMI) 27.0-27.9, adult: Secondary | ICD-10-CM

## 2015-09-26 DIAGNOSIS — Z0001 Encounter for general adult medical examination with abnormal findings: Secondary | ICD-10-CM

## 2015-09-26 DIAGNOSIS — E785 Hyperlipidemia, unspecified: Secondary | ICD-10-CM

## 2015-09-26 DIAGNOSIS — Z1211 Encounter for screening for malignant neoplasm of colon: Secondary | ICD-10-CM

## 2015-09-26 DIAGNOSIS — K219 Gastro-esophageal reflux disease without esophagitis: Secondary | ICD-10-CM

## 2015-09-26 LAB — CBC WITH DIFFERENTIAL/PLATELET
Basophils Absolute: 0 10*3/uL (ref 0.0–0.1)
Basophils Relative: 1 % (ref 0–1)
Eosinophils Absolute: 0.1 10*3/uL (ref 0.0–0.7)
Eosinophils Relative: 3 % (ref 0–5)
HCT: 34.9 % — ABNORMAL LOW (ref 36.0–46.0)
Hemoglobin: 11.8 g/dL — ABNORMAL LOW (ref 12.0–15.0)
Lymphocytes Relative: 22 % (ref 12–46)
Lymphs Abs: 0.7 10*3/uL (ref 0.7–4.0)
MCH: 31.3 pg (ref 26.0–34.0)
MCHC: 33.8 g/dL (ref 30.0–36.0)
MCV: 92.6 fL (ref 78.0–100.0)
MPV: 10.3 fL (ref 8.6–12.4)
Monocytes Absolute: 0.2 10*3/uL (ref 0.1–1.0)
Monocytes Relative: 7 % (ref 3–12)
Neutro Abs: 2.1 10*3/uL (ref 1.7–7.7)
Neutrophils Relative %: 67 % (ref 43–77)
Platelets: 147 10*3/uL — ABNORMAL LOW (ref 150–400)
RBC: 3.77 MIL/uL — ABNORMAL LOW (ref 3.87–5.11)
RDW: 13.7 % (ref 11.5–15.5)
WBC: 3.2 10*3/uL — ABNORMAL LOW (ref 4.0–10.5)

## 2015-09-26 LAB — IRON AND TIBC
%SAT: 27 % (ref 11–50)
Iron: 84 ug/dL (ref 45–160)
TIBC: 314 ug/dL (ref 250–450)
UIBC: 230 ug/dL (ref 125–400)

## 2015-09-26 LAB — LIPID PANEL
Cholesterol: 136 mg/dL (ref 125–200)
HDL: 58 mg/dL (ref >=46)
LDL Cholesterol: 56 mg/dL (ref <130)
Total CHOL/HDL Ratio: 2.3 Ratio (ref <=5.0)
Triglycerides: 108 mg/dL (ref <150)
VLDL: 22 mg/dL (ref <30)

## 2015-09-26 LAB — BASIC METABOLIC PANEL WITH GFR
BUN: 19 mg/dL (ref 7–25)
CO2: 25 mmol/L (ref 20–31)
Calcium: 9.2 mg/dL (ref 8.6–10.4)
Chloride: 104 mmol/L (ref 98–110)
Creat: 0.91 mg/dL (ref 0.60–0.93)
GFR, Est African American: 74 mL/min (ref >=60)
GFR, Est Non African American: 64 mL/min (ref >=60)
Glucose, Bld: 96 mg/dL (ref 65–99)
Potassium: 3.8 mmol/L (ref 3.5–5.3)
Sodium: 137 mmol/L (ref 135–146)

## 2015-09-26 LAB — HEPATIC FUNCTION PANEL
ALT: 12 U/L (ref 6–29)
AST: 15 U/L (ref 10–35)
Albumin: 4 g/dL (ref 3.6–5.1)
Alkaline Phosphatase: 73 U/L (ref 33–130)
Bilirubin, Direct: 0.1 mg/dL (ref <=0.2)
Indirect Bilirubin: 0.4 mg/dL (ref 0.2–1.2)
Total Bilirubin: 0.5 mg/dL (ref 0.2–1.2)
Total Protein: 6.5 g/dL (ref 6.1–8.1)

## 2015-09-26 LAB — MAGNESIUM: Magnesium: 1.7 mg/dL (ref 1.5–2.5)

## 2015-09-26 LAB — HEMOGLOBIN A1C
Hgb A1c MFr Bld: 5.1 % (ref <5.7)
Mean Plasma Glucose: 100 mg/dL (ref <117)

## 2015-09-26 MED ORDER — TRAZODONE HCL 150 MG PO TABS: 150.0000 mg | ORAL_TABLET | Freq: Every day | ORAL | Status: DC

## 2015-09-26 MED ORDER — TRIAMCINOLONE ACETONIDE 0.1 % EX CREA: 1.0000 "application " | TOPICAL_CREAM | Freq: Two times a day (BID) | CUTANEOUS | Status: DC

## 2015-09-26 NOTE — Patient Instructions (Signed)
Preventive Care for Adults  A healthy lifestyle and preventive care can promote health and wellness. Preventive health guidelines for women include the following key practices.  A routine yearly physical is a good way to check with your health care provider about your health and preventive screening. It is a chance to share any concerns and updates on your health and to receive a thorough exam.  Visit your dentist for a routine exam and preventive care every 6 months. Brush your teeth twice a day and floss once a day. Good oral hygiene prevents tooth decay and gum disease.  The frequency of eye exams is based on your age, health, family medical history, use of contact lenses, and other factors. Follow your health care provider's recommendations for frequency of eye exams.  Eat a healthy diet. Foods like vegetables, fruits, whole grains, low-fat dairy products, and lean protein foods contain the nutrients you need without too many calories. Decrease your intake of foods high in solid fats, added sugars, and salt. Eat the right amount of calories for you.Get information about a proper diet from your health care provider, if necessary.  Regular physical exercise is one of the most important things you can do for your health. Most adults should get at least 150 minutes of moderate-intensity exercise (any activity that increases your heart rate and causes you to sweat) each week. In addition, most adults need muscle-strengthening exercises on 2 or more days a week.  Maintain a healthy weight. The body mass index (BMI) is a screening tool to identify possible weight problems. It provides an estimate of body fat based on height and weight. Your health care provider can find your BMI and can help you achieve or maintain a healthy weight.For adults 20 years and older:  A BMI below 18.5 is considered underweight.  A BMI of 18.5 to 24.9 is normal.  A BMI of 25 to 29.9 is considered overweight.  A BMI  of 30 and above is considered obese.  Maintain normal blood lipids and cholesterol levels by exercising and minimizing your intake of saturated fat. Eat a balanced diet with plenty of fruit and vegetables. If your lipid or cholesterol levels are high, you are over 50, or you are at high risk for heart disease, you may need your cholesterol levels checked more frequently.Ongoing high lipid and cholesterol levels should be treated with medicines if diet and exercise are not working.  If you smoke, find out from your health care provider how to quit. If you do not use tobacco, do not start.  Lung cancer screening is recommended for adults aged 2-80 years who are at high risk for developing lung cancer because of a history of smoking. A yearly low-dose CT scan of the lungs is recommended for people who have at least a 30-pack-year history of smoking and are a current smoker or have quit within the past 15 years. A pack year of smoking is smoking an average of 1 pack of cigarettes a day for 1 year (for example: 1 pack a day for 30 years or 2 packs a day for 15 years). Yearly screening should continue until the smoker has stopped smoking for at least 15 years. Yearly screening should be stopped for people who develop a health problem that would prevent them from having lung cancer treatment.  Avoid use of street drugs. Do not share needles with anyone. Ask for help if you need support or instructions about stopping the use of drugs.  High  blood pressure causes heart disease and increases the risk of stroke.  Ongoing high blood pressure should be treated with medicines if weight loss and exercise do not work.  If you are 55-79 years old, ask your health care provider if you should take aspirin to prevent strokes.  Diabetes screening involves taking a blood sample to check your fasting blood sugar level. This should be done once every 3 years, after age 45, if you are within normal weight and without risk  factors for diabetes. Testing should be considered at a younger age or be carried out more frequently if you are overweight and have at least 1 risk factor for diabetes.  Breast cancer screening is essential preventive care for women. You should practice "breast self-awareness." This means understanding the normal appearance and feel of your breasts and may include breast self-examination. Any changes detected, no matter how small, should be reported to a health care provider. Women in their 20s and 30s should have a clinical breast exam (CBE) by a health care provider as part of a regular health exam every 1 to 3 years. After age 40, women should have a CBE every year. Starting at age 40, women should consider having a mammogram (breast X-ray test) every year. Women who have a family history of breast cancer should talk to their health care provider about genetic screening. Women at a high risk of breast cancer should talk to their health care providers about having an MRI and a mammogram every year.  Breast cancer gene (BRCA)-related cancer risk assessment is recommended for women who have family members with BRCA-related cancers. BRCA-related cancers include breast, ovarian, tubal, and peritoneal cancers. Having family members with these cancers may be associated with an increased risk for harmful changes (mutations) in the breast cancer genes BRCA1 and BRCA2. Results of the assessment will determine the need for genetic counseling and BRCA1 and BRCA2 testing.  Routine pelvic exams to screen for cancer are no longer recommended for nonpregnant women who are considered low risk for cancer of the pelvic organs (ovaries, uterus, and vagina) and who do not have symptoms. Ask your health care provider if a screening pelvic exam is right for you.  If you have had past treatment for cervical cancer or a condition that could lead to cancer, you need Pap tests and screening for cancer for at least 20 years after  your treatment. If Pap tests have been discontinued, your risk factors (such as having a new sexual partner) need to be reassessed to determine if screening should be resumed. Some women have medical problems that increase the chance of getting cervical cancer. In these cases, your health care provider may recommend more frequent screening and Pap tests.    Colorectal cancer can be detected and often prevented. Most routine colorectal cancer screening begins at the age of 50 years and continues through age 75 years. However, your health care provider may recommend screening at an earlier age if you have risk factors for colon cancer. On a yearly basis, your health care provider may provide home test kits to check for hidden blood in the stool. Use of a small camera at the end of a tube, to directly examine the colon (sigmoidoscopy or colonoscopy), can detect the earliest forms of colorectal cancer. Talk to your health care provider about this at age 50, when routine screening begins. Direct exam of the colon should be repeated every 5-10 years through age 75 years, unless early forms of pre-cancerous   polyps or small growths are found.  Osteoporosis is a disease in which the bones lose minerals and strength with aging. This can result in serious bone fractures or breaks. The risk of osteoporosis can be identified using a bone density scan. Women ages 68 years and over and women at risk for fractures or osteoporosis should discuss screening with their health care providers. Ask your health care provider whether you should take a calcium supplement or vitamin D to reduce the rate of osteoporosis.  Menopause can be associated with physical symptoms and risks. Hormone replacement therapy is available to decrease symptoms and risks. You should talk to your health care provider about whether hormone replacement therapy is right for you.  Use sunscreen. Apply sunscreen liberally and repeatedly throughout the day.  You should seek shade when your shadow is shorter than you. Protect yourself by wearing long sleeves, pants, a wide-brimmed hat, and sunglasses year round, whenever you are outdoors.  Once a month, do a whole body skin exam, using a mirror to look at the skin on your back. Tell your health care provider of new moles, moles that have irregular borders, moles that are larger than a pencil eraser, or moles that have changed in shape or color.  Stay current with required vaccines (immunizations).  Influenza vaccine. All adults should be immunized every year.  Tetanus, diphtheria, and acellular pertussis (Td, Tdap) vaccine. Pregnant women should receive 1 dose of Tdap vaccine during each pregnancy. The dose should be obtained regardless of the length of time since the last dose. Immunization is preferred during the 27th-36th week of gestation. An adult who has not previously received Tdap or who does not know her vaccine status should receive 1 dose of Tdap. This initial dose should be followed by tetanus and diphtheria toxoids (Td) booster doses every 10 years. Adults with an unknown or incomplete history of completing a 3-dose immunization series with Td-containing vaccines should begin or complete a primary immunization series including a Tdap dose. Adults should receive a Td booster every 10 years.    Zoster vaccine. One dose is recommended for adults aged 36 years or older unless certain conditions are present.    Pneumococcal 13-valent conjugate (PCV13) vaccine. When indicated, a person who is uncertain of her immunization history and has no record of immunization should receive the PCV13 vaccine. An adult aged 1 years or older who has certain medical conditions and has not been previously immunized should receive 1 dose of PCV13 vaccine. This PCV13 should be followed with a dose of pneumococcal polysaccharide (PPSV23) vaccine. The PPSV23 vaccine dose should be obtained at least 8 weeks after the  dose of PCV13 vaccine. An adult aged 73 years or older who has certain medical conditions and previously received 1 or more doses of PPSV23 vaccine should receive 1 dose of PCV13. The PCV13 vaccine dose should be obtained 1 or more years after the last PPSV23 vaccine dose.    Pneumococcal polysaccharide (PPSV23) vaccine. When PCV13 is also indicated, PCV13 should be obtained first. All adults aged 47 years and older should be immunized. An adult younger than age 86 years who has certain medical conditions should be immunized. Any person who resides in a nursing home or long-term care facility should be immunized. An adult smoker should be immunized. People with an immunocompromised condition and certain other conditions should receive both PCV13 and PPSV23 vaccines. People with human immunodeficiency virus (HIV) infection should be immunized as soon as possible after diagnosis. Immunization  during chemotherapy or radiation therapy should be avoided. Routine use of PPSV23 vaccine is not recommended for American Indians, [place]atives, or people younger than 65 years unless there are medical conditions that require PPSV23 vaccine. When indicated, people who have unknown immunization and have no record of immunization should receive PPSV23 vaccine. One-time revaccination 5 years after the first dose of PPSV23 is recommended for people aged 19-64 years who have chronic kidney failure, nephrotic syndrome, asplenia, or immunocompromised conditions. People who received 1-2 doses of PPSV23 before age 52 years should receive another dose of PPSV23 vaccine at age 7 years or later if at least 5 years have passed since the previous dose. Doses of PPSV23 are not needed for people immunized with PPSV23 at or after age 33 years.   Preventive Services / Frequency  Ages 24 years and over  Blood pressure check.  Lipid and cholesterol check.  Lung cancer screening. / Every year if you are aged 57-80 years and have a  30-pack-year history of smoking and currently smoke or have quit within the past 15 years. Yearly screening is stopped once you have quit smoking for at least 15 years or develop a health problem that would prevent you from having lung cancer treatment.  Clinical breast exam.** / Every year after age 108 years.  BRCA-related cancer risk assessment.** / For women who have family members with a BRCA-related cancer (breast, ovarian, tubal, or peritoneal cancers).  Mammogram.** / Every year beginning at age 95 years and continuing for as long as you are in good health. Consult with your health care provider.  Pap test.** / Every 3 years starting at age 45 years through age 52 or 15 years with 3 consecutive normal Pap tests. Testing can be stopped between 65 and 70 years with 3 consecutive normal Pap tests and no abnormal Pap or HPV tests in the past 10 years.  Fecal occult blood test (FOBT) of stool. / Every year beginning at age 89 years and continuing until age 17 years. You may not need to do this test if you get a colonoscopy every 10 years.  Flexible sigmoidoscopy or colonoscopy.** / Every 5 years for a flexible sigmoidoscopy or every 10 years for a colonoscopy beginning at age 87 years and continuing until age 60 years.  Hepatitis C blood test.** / For all people born from [birth year] through 1966 and any individual with known risks for hepatitis C.  Osteoporosis screening.** / A one-time screening for women ages 52 years and over and women at risk for fractures or osteoporosis.  Skin self-exam. / Monthly.  Influenza vaccine. / Every year.  Tetanus, diphtheria, and acellular pertussis (Tdap/Td) vaccine.** / 1 dose of Td every 10 years.  Zoster vaccine.** / 1 dose for adults aged 42 years or older.  Pneumococcal 13-valent conjugate (PCV13) vaccine.** / Consult your health care provider.  Pneumococcal polysaccharide (PPSV23) vaccine.** / 1 dose for all adults aged 37 years and older. Screening  for abdominal aortic aneurysm (AAA)  by ultrasound is recommended for people who have history of high blood pressure or who are current or former smokers.

## 2015-09-26 NOTE — Progress Notes (Signed)
Patient ID: Kimberly Cook, female   DOB: 02-04-1946, 70 y.o.   MRN: [id number]  Complete Physical  Assessment and Plan:   1. Need for prophylactic vaccination with tetanus-diphtheria (TD)  - DT Vaccine greater than 7yo IM  2. Essential hypertension  - Urinalysis, Routine w reflex microscopic (not at [facility]) - Microalbumin / creatinine urine ratio - EKG 12-Lead  3. Hypothyroidism, unspecified hypothyroidism type -cont levothyroxine - TSH  4. Hyperlipidemia -cont diet and exercise  5. Prediabetes  - Hemoglobin A1c - Insulin, random  6. Vitamin D deficiency -cont supplement - VITAMIN D 25 Hydroxy (Vit-D Deficiency, Fractures)  7. Gastroesophageal reflux disease, esophagitis presence not specified -cont meds as needed  8. Depression -currently well controlled  9. Medication management  - CBC with Differential/Platelet - BASIC METABOLIC PANEL WITH GFR - Hepatic function panel - Magnesium - Lipid panel  10. Morbid obesity with alveolar hypoventilation (HCC) -cont diet and exercise  11. Status post total knee replacement, unspecified laterality -encouraged continued exercises for PT  12. BMI 27.0-27.9,adult -diet and exercise  13. Deficiency anemia  - Iron and TIBC - Vitamin B12  14. Screening for colon cancer  - POC Hemoccult Bld/Stl (3-Cd Home Screen); Future  Shots and screening exams currently up to date.   EKG WNL.  There is mild QT prolongation which has been present quite some time.  No new changes.   Discussed med's effects and SE's. Screening labs and tests as requested with regular follow-up as recommended.  HPI  70 y.o. female  presents for a complete physical.  Her blood pressure has been controlled at home, today their BP is BP: 126/80 mmHg.  She does not workout. She denies chest pain, shortness of breath, dizziness. She did recently have a knee replacement.     She is on cholesterol medication and denies myalgias. Her cholesterol is  at goal. The cholesterol last visit was:  Lab Results  Component Value Date   CHOL 140 06/16/2015   HDL 53 06/16/2015   LDLCALC 57 06/16/2015   TRIG 149 06/16/2015   CHOLHDL 2.6 06/16/2015  .  She has been working on diet and exercise for prediabetes, she is on bASA, she is on ACE/ARB and denies foot ulcerations, hyperglycemia, hypoglycemia , increased appetite, nausea, paresthesia of the feet, polydipsia, polyuria, visual disturbances, vomiting and weight loss. Last A1C in the office was:  Lab Results  Component Value Date   HGBA1C 5.0 06/16/2015    Patient is on Vitamin D supplement.   Lab Results  Component Value Date   VD25OH 48 06/16/2015     Patient reports that she has been having some issues with sleeping.  She reports that she was given a medication which was meant to be an antidepressant.  She reports that she is using a triangular medication in combination with the muscle relaxant.  She isn't sure what the name of it is.    Current Medications:  Current Outpatient Prescriptions on File Prior to Visit  Medication Sig Dispense Refill  . aspirin 325 MG tablet Take 325 mg by mouth daily.    . Biotin 1 MG CAPS Take 1 mg by mouth daily.     . Calcium Carb-Cholecalciferol (CALCIUM-VITAMIN D) 600-400 MG-UNIT TABS Take 1 tablet by mouth daily.    . Cholecalciferol (VITAMIN D3) 2000 UNITS capsule Take 6,000 Units by mouth daily.    Marland Kitchen docusate sodium (COLACE) 100 MG capsule Take 1 capsule (100 mg total) by mouth 2 (two) times  daily. (Patient taking differently: Take 100 mg by mouth daily as needed for mild constipation or moderate constipation. ) 10 capsule 0  . levothyroxine (SYNTHROID, LEVOTHROID) 112 MCG tablet Take 1 tablet (112 mcg total) by mouth daily. 90 tablet 3  . lisinopril (PRINIVIL,ZESTRIL) 20 MG tablet TAKE 1 TABLET EVERY DAY FOR BLOOD PRESSURE 90 tablet 0  . LORazepam (ATIVAN) 1 MG tablet Take 1/2 to 1 tablet at bedtime for sleep. (Patient taking differently: Take 1 mg  by mouth at bedtime. Take 1/2 to 1 tablet at bedtime for sleep.) 30 tablet 0  . MAGNESIUM OXIDE PO Take 400 mg by mouth daily. 400 mg    . methocarbamol (ROBAXIN) 500 MG tablet TAKE 1 TABLET THREE TIMES DAILY IF NEEDED FOR MUSCLE SPASMS 90 tablet 1  . omeprazole (PRILOSEC) 40 MG capsule TAKE 1 CAPSULE EVERY DAY (Patient taking differently: Take 40 mg by mouth daily as needed for heartburn/indigestion) 90 capsule 1  . simvastatin (ZOCOR) 40 MG tablet Take 1 tablet (40 mg total) by mouth every evening. 90 tablet 3   No current facility-administered medications on file prior to visit.    Health Maintenance:   Immunization History  Administered Date(s) Administered  . DT 09/26/2015  . Influenza, High Dose Seasonal PF 06/16/2015  . Influenza-Unspecified 04/18/2013, 04/05/2014  . PPD Test 04/19/2015  . Pneumococcal Conjugate-13 06/16/2015  . Pneumococcal-Unspecified 08/18/2010  . Td 08/18/2005  . Zoster 08/18/2005    Tetanus: 2017 Pneumovax: 2012 Flu vaccine: 2016 Zostavax: 2007 MGM: 2016 DEXA: 2016 Colonoscopy: 2012  Patient Care Team: Lucky Cowboy, MD as PCP - General (Internal Medicine) Charlott Rakes, MD as Consulting Physician (Gastroenterology) Salvatore Marvel, MD as Consulting Physician (Orthopedic Surgery)  Allergies: No Known Allergies  Medical History:  Past Medical History  Diagnosis Date  . Hyperlipidemia   . GERD (gastroesophageal reflux disease)   . Arthritis   . Depression   . Vitamin D deficiency   . Prediabetes   . Occipital neuralgia   . Hypertension     Normal cardiolite 05/2006 EF 71%  . Mini stroke (HCC) 2011   . Hypothyroidism   . Urinary tract infection     hx of   . Headache     Surgical History:  Past Surgical History  Procedure Laterality Date  . Breast biopsy Right over 20 years     benign  . Cholecystectomy    . Bladder surgery    . Tonsillectomy and adenoidectomy    . Knee arthroscopy Left 2011  . Abdominal hysterectomy    .  Gastroplication     . Total knee arthroplasty Left 04/17/2015    Procedure: LEFT TOTAL KNEE ARTHROPLASTY;  Surgeon: Durene Romans, MD;  Location: [facility];  Service: Orthopedics;  Laterality: Left;    Family History:  Family History  Problem Relation Age of Onset  . Heart disease Mother   . Hypertension Mother   . Diabetes Mother   . Heart disease Father   . Cancer Maternal Aunt     breast  . Stroke Neg Hx     Social History:  Social History  Substance Use Topics  . Smoking status: Never Smoker   . Smokeless tobacco: Never Used  . Alcohol Use: No    Review of Systems: Review of Systems  Constitutional: Negative for fever, chills and malaise/fatigue.  HENT: Negative for congestion, ear pain and sore throat.   Respiratory: Negative for cough, shortness of breath and wheezing.   Cardiovascular: Negative for chest pain,  palpitations and leg swelling.  Gastrointestinal: Negative for heartburn, abdominal pain, diarrhea, constipation, blood in stool and melena.  Genitourinary: Negative.   Skin: Negative.   Neurological: Negative for headaches.  Psychiatric/Behavioral: Negative for depression. The patient is not nervous/anxious and does not have insomnia.     Physical Exam: Estimated body mass index is 28.73 kg/(m^2) as calculated from the following:   Height as of this encounter: 5' 3.75" (1.619 m).   Weight as of this encounter: 166 lb (75.297 kg). BP 126/80 mmHg  Temp(Src) 98.2 F (36.8 C) (Temporal)  Resp 16  Ht 5' 3.75" (1.619 m)  Wt 166 lb (75.297 kg)  BMI 28.73 kg/m2  General Appearance: Well nourished well developed, in no apparent distress.  Eyes: PERRLA, EOMs, conjunctiva no swelling or erythema ENT/Mouth: Ear canals normal without obstruction, swelling, erythema, or discharge.  TMs normal bilaterally with no erythema, bulging, retraction, or loss of landmark.  Oropharynx moist and clear with no exudate, erythema, or swelling.   Neck: Supple, thyroid normal. No  bruits.  No cervical adenopathy Respiratory: Respiratory effort normal, Breath sounds clear A&P without wheeze, rhonchi, rales.   Cardio: RRR without murmurs, rubs or gallops. Brisk peripheral pulses without edema.  Chest: symmetric, with normal excursions Breasts: Symmetric, without lumps, nipple discharge, retractions.  Scar to the right breast from previous breast surgery.  Breast tissue fibrocystic.    Abdomen: Soft, nontender, no guarding, rebound, hernias, masses, or organomegaly.  Lymphatics: Non tender without lymphadenopathy.  Musculoskeletal: Full ROM all peripheral extremities,5/5 strength, and normal gait.  Skin: Warm, dry without rashes, lesions, ecchymosis. Neuro: Awake and oriented X 3, Cranial nerves intact, reflexes equal bilaterally. Normal muscle tone, no cerebellar symptoms. Sensation intact.  Psych:  normal affect, Insight and Judgment appropriate.   EKG: WNL no changes.   Over 40 minutes of exam, counseling, chart review and critical decision making was performed  Terri Piedra 10:36 AM [facility]

## 2015-09-27 LAB — URINALYSIS, ROUTINE W REFLEX MICROSCOPIC
Bilirubin Urine: NEGATIVE
Glucose, UA: NEGATIVE
Hgb urine dipstick: NEGATIVE
Ketones, ur: NEGATIVE
Nitrite: NEGATIVE
Protein, ur: NEGATIVE
Specific Gravity, Urine: 1.027 (ref 1.001–1.035)
pH: 5 (ref 5.0–8.0)

## 2015-09-27 LAB — URINALYSIS, MICROSCOPIC ONLY
Bacteria, UA: NONE SEEN [HPF]
Casts: NONE SEEN [LPF]
Crystals: NONE SEEN [HPF]
RBC / HPF: NONE SEEN RBC/HPF (ref <=2)
Yeast: NONE SEEN [HPF]

## 2015-09-27 LAB — VITAMIN B12: Vitamin B-12: 877 pg/mL (ref 200–1100)

## 2015-09-27 LAB — MICROALBUMIN / CREATININE URINE RATIO
Creatinine, Urine: 142 mg/dL (ref 20–320)
Microalb Creat Ratio: 10 mcg/mg creat (ref <30)
Microalb, Ur: 1.4 mg/dL

## 2015-09-27 LAB — TSH: TSH: 1.13 mIU/L

## 2015-09-27 LAB — VITAMIN D 25 HYDROXY (VIT D DEFICIENCY, FRACTURES): Vit D, 25-Hydroxy: 51 ng/mL (ref 30–100)

## 2015-09-27 LAB — INSULIN, RANDOM: Insulin: 9.1 u[IU]/mL (ref 2.0–19.6)

## 2015-10-02 ENCOUNTER — Other Ambulatory Visit: Payer: Self-pay | Admitting: Physician Assistant

## 2015-10-12 ENCOUNTER — Other Ambulatory Visit: Payer: Self-pay | Admitting: Internal Medicine

## 2015-11-04 ENCOUNTER — Other Ambulatory Visit: Payer: Self-pay | Admitting: Internal Medicine

## 2015-11-13 ENCOUNTER — Other Ambulatory Visit: Payer: Self-pay | Admitting: Internal Medicine

## 2015-12-13 ENCOUNTER — Observation Stay (HOSPITAL_COMMUNITY)
Admission: EM | Admit: 2015-12-13 | Discharge: 2015-12-14 | Disposition: A | Discharge status: Home / Self Care | Payer: Commercial Managed Care - HMO | Attending: Family Medicine | Admitting: Family Medicine

## 2015-12-13 ENCOUNTER — Encounter (HOSPITAL_COMMUNITY): Payer: Self-pay | Admitting: Nurse Practitioner

## 2015-12-13 ENCOUNTER — Emergency Department (HOSPITAL_COMMUNITY): Payer: Commercial Managed Care - HMO

## 2015-12-13 ENCOUNTER — Other Ambulatory Visit: Payer: Self-pay

## 2015-12-13 DIAGNOSIS — F329 Major depressive disorder, single episode, unspecified: Secondary | ICD-10-CM | POA: Diagnosis not present

## 2015-12-13 DIAGNOSIS — Z79899 Other long term (current) drug therapy: Secondary | ICD-10-CM | POA: Insufficient documentation

## 2015-12-13 DIAGNOSIS — Z7982 Long term (current) use of aspirin: Secondary | ICD-10-CM | POA: Insufficient documentation

## 2015-12-13 DIAGNOSIS — R0602 Shortness of breath: Secondary | ICD-10-CM | POA: Diagnosis not present

## 2015-12-13 DIAGNOSIS — M1990 Unspecified osteoarthritis, unspecified site: Secondary | ICD-10-CM | POA: Diagnosis not present

## 2015-12-13 DIAGNOSIS — Z8673 Personal history of transient ischemic attack (TIA), and cerebral infarction without residual deficits: Secondary | ICD-10-CM | POA: Insufficient documentation

## 2015-12-13 DIAGNOSIS — N390 Urinary tract infection, site not specified: Secondary | ICD-10-CM | POA: Insufficient documentation

## 2015-12-13 DIAGNOSIS — I10 Essential (primary) hypertension: Secondary | ICD-10-CM | POA: Diagnosis not present

## 2015-12-13 DIAGNOSIS — K219 Gastro-esophageal reflux disease without esophagitis: Secondary | ICD-10-CM | POA: Insufficient documentation

## 2015-12-13 DIAGNOSIS — E663 Overweight: Secondary | ICD-10-CM

## 2015-12-13 DIAGNOSIS — E559 Vitamin D deficiency, unspecified: Secondary | ICD-10-CM | POA: Diagnosis not present

## 2015-12-13 DIAGNOSIS — R0789 Other chest pain: Secondary | ICD-10-CM | POA: Diagnosis not present

## 2015-12-13 DIAGNOSIS — R079 Chest pain, unspecified: Secondary | ICD-10-CM | POA: Diagnosis not present

## 2015-12-13 DIAGNOSIS — R072 Precordial pain: Secondary | ICD-10-CM | POA: Diagnosis not present

## 2015-12-13 DIAGNOSIS — E785 Hyperlipidemia, unspecified: Secondary | ICD-10-CM | POA: Insufficient documentation

## 2015-12-13 DIAGNOSIS — E039 Hypothyroidism, unspecified: Secondary | ICD-10-CM | POA: Diagnosis present

## 2015-12-13 DIAGNOSIS — R7303 Prediabetes: Secondary | ICD-10-CM | POA: Diagnosis not present

## 2015-12-13 DIAGNOSIS — Z6827 Body mass index (BMI) 27.0-27.9, adult: Secondary | ICD-10-CM

## 2015-12-13 DIAGNOSIS — F32A Depression, unspecified: Secondary | ICD-10-CM | POA: Diagnosis present

## 2015-12-13 HISTORY — DX: Essential (primary) hypertension: I10

## 2015-12-13 LAB — BASIC METABOLIC PANEL
Anion gap: 7 (ref 5–15)
BUN: 16 mg/dL (ref 6–20)
CO2: 24 mmol/L (ref 22–32)
Calcium: 9.5 mg/dL (ref 8.9–10.3)
Chloride: 109 mmol/L (ref 101–111)
Creatinine, Ser: 0.93 mg/dL (ref 0.44–1.00)
GFR calc Af Amer: >60 mL/min (ref >60)
GFR calc non Af Amer: >60 mL/min (ref >60)
Glucose, Bld: 108 mg/dL — ABNORMAL HIGH (ref 65–99)
Potassium: 3.9 mmol/L (ref 3.5–5.1)
Sodium: 140 mmol/L (ref 135–145)

## 2015-12-13 LAB — CBC
HCT: 38.6 % (ref 36.0–46.0)
Hemoglobin: 13.2 g/dL (ref 12.0–15.0)
MCH: 31.5 pg (ref 26.0–34.0)
MCHC: 34.2 g/dL (ref 30.0–36.0)
MCV: 92.1 fL (ref 78.0–100.0)
Platelets: 166 10*3/uL (ref 150–400)
RBC: 4.19 MIL/uL (ref 3.87–5.11)
RDW: 12.6 % (ref 11.5–15.5)
WBC: 4 10*3/uL (ref 4.0–10.5)

## 2015-12-13 LAB — I-STAT TROPONIN, ED: Troponin i, poc: 0 ng/mL (ref 0.00–0.08)

## 2015-12-13 LAB — TROPONIN I: Troponin I: <0.03 ng/mL (ref <0.031)

## 2015-12-13 MED ORDER — TRAZODONE HCL 50 MG PO TABS
150.0000 mg | ORAL_TABLET | Freq: Every day | ORAL | Status: DC
  Administered 2015-12-13: 150 mg via ORAL
  Filled 2015-12-13: qty 1

## 2015-12-13 MED ORDER — HEPARIN SODIUM (PORCINE) 5000 UNIT/ML IJ SOLN
5000.0000 [IU] | Freq: Three times a day (TID) | INTRAMUSCULAR | Status: DC
  Administered 2015-12-13 – 2015-12-14 (×2): 5000 [IU] via SUBCUTANEOUS
  Filled 2015-12-13 (×2): qty 1

## 2015-12-13 MED ORDER — ASPIRIN 81 MG PO CHEW
324.0000 mg | CHEWABLE_TABLET | Freq: Once | ORAL | Status: AC
  Administered 2015-12-13: 324 mg via ORAL
  Filled 2015-12-13: qty 4

## 2015-12-13 MED ORDER — CARVEDILOL 3.125 MG PO TABS: 3.1250 mg | ORAL_TABLET | Freq: Two times a day (BID) | ORAL | Status: DC

## 2015-12-13 MED ORDER — ONDANSETRON HCL 4 MG/2ML IJ SOLN: 4.0000 mg | Freq: Four times a day (QID) | INTRAMUSCULAR | Status: DC | PRN

## 2015-12-13 MED ORDER — BIOTIN 1 MG PO CAPS: 1.0000 mg | ORAL_CAPSULE | Freq: Every day | ORAL | Status: DC

## 2015-12-13 MED ORDER — LISINOPRIL 20 MG PO TABS
20.0000 mg | ORAL_TABLET | Freq: Every day | ORAL | Status: DC
  Administered 2015-12-13 – 2015-12-14 (×2): 20 mg via ORAL
  Filled 2015-12-13 (×2): qty 1

## 2015-12-13 MED ORDER — METHOCARBAMOL 500 MG PO TABS
500.0000 mg | ORAL_TABLET | Freq: Three times a day (TID) | ORAL | Status: DC
  Administered 2015-12-13 – 2015-12-14 (×2): 500 mg via ORAL
  Filled 2015-12-13 (×2): qty 1

## 2015-12-13 MED ORDER — ASPIRIN EC 81 MG PO TBEC
81.0000 mg | DELAYED_RELEASE_TABLET | Freq: Every day | ORAL | Status: DC
  Administered 2015-12-14: 81 mg via ORAL
  Filled 2015-12-13: qty 1

## 2015-12-13 MED ORDER — GI COCKTAIL ~~LOC~~
30.0000 mL | Freq: Once | ORAL | Status: AC
  Administered 2015-12-13: 30 mL via ORAL
  Filled 2015-12-13: qty 30

## 2015-12-13 MED ORDER — LEVOTHYROXINE SODIUM 112 MCG PO TABS
112.0000 ug | ORAL_TABLET | Freq: Every day | ORAL | Status: DC
  Administered 2015-12-14: 112 ug via ORAL
  Filled 2015-12-13: qty 1

## 2015-12-13 MED ORDER — NITROGLYCERIN 0.4 MG SL SUBL
SUBLINGUAL_TABLET | SUBLINGUAL | Status: AC
  Filled 2015-12-13: qty 1

## 2015-12-13 MED ORDER — SIMVASTATIN 40 MG PO TABS
40.0000 mg | ORAL_TABLET | Freq: Every evening | ORAL | Status: DC
  Administered 2015-12-13: 40 mg via ORAL
  Filled 2015-12-13: qty 1

## 2015-12-13 MED ORDER — NITROGLYCERIN 0.4 MG SL SUBL
0.4000 mg | SUBLINGUAL_TABLET | Freq: Once | SUBLINGUAL | Status: AC
  Administered 2015-12-13: 0.4 mg via SUBLINGUAL

## 2015-12-13 MED ORDER — ONDANSETRON HCL 4 MG/2ML IJ SOLN
4.0000 mg | Freq: Once | INTRAMUSCULAR | Status: AC
  Administered 2015-12-13: 4 mg via INTRAVENOUS
  Filled 2015-12-13: qty 2

## 2015-12-13 MED ORDER — LORAZEPAM 1 MG PO TABS
1.0000 mg | ORAL_TABLET | Freq: Every day | ORAL | Status: DC
  Administered 2015-12-13: 1 mg via ORAL
  Filled 2015-12-13: qty 1

## 2015-12-13 MED ORDER — NITROGLYCERIN 0.4 MG SL SUBL: 0.4000 mg | SUBLINGUAL_TABLET | SUBLINGUAL | Status: DC | PRN

## 2015-12-13 MED ORDER — ACETAMINOPHEN 325 MG PO TABS
650.0000 mg | ORAL_TABLET | ORAL | Status: DC | PRN
  Administered 2015-12-13 – 2015-12-14 (×2): 650 mg via ORAL
  Filled 2015-12-13 (×2): qty 2

## 2015-12-13 MED ORDER — MAGNESIUM OXIDE 400 (241.3 MG) MG PO TABS
400.0000 mg | ORAL_TABLET | Freq: Every day | ORAL | Status: DC
  Administered 2015-12-13 – 2015-12-14 (×2): 400 mg via ORAL
  Filled 2015-12-13 (×2): qty 1

## 2015-12-13 MED ORDER — PANTOPRAZOLE SODIUM 40 MG PO TBEC
40.0000 mg | DELAYED_RELEASE_TABLET | Freq: Every day | ORAL | Status: DC
  Administered 2015-12-13 – 2015-12-14 (×2): 40 mg via ORAL
  Filled 2015-12-13 (×2): qty 1

## 2015-12-13 MED ORDER — REGADENOSON 0.4 MG/5ML IV SOLN
0.4000 mg | Freq: Once | INTRAVENOUS | Status: AC
  Administered 2015-12-14: 0.4 mg via INTRAVENOUS
  Filled 2015-12-13: qty 5

## 2015-12-13 NOTE — ED Notes (Addendum)
Pt c/o several hour history of tightness in chest, L arm pain, and headache. Onset of symptoms while driving. She denies sob, nausea, diaphoresis. She reports recent sinus congestion. Symptoms have decreased since onset but remain. She is alert and breathing easily

## 2015-12-13 NOTE — ED Provider Notes (Signed)
CSN: [id number]     Arrival date & time 12/13/15  1416 History   First MD Initiated Contact with Patient 12/13/15 1646     Chief Complaint  Patient presents with  . Chest Pain    HPI Comments: Family hx of CAD.  No prior exertional cp.  No hemoptysis.  Has had sinus congestion.  Stress test 6 yrs ago per pt was negative.  Took antiHTN today  Patient is a 70 y.o. female presenting with chest pain. The history is provided by the patient.  Chest Pain Pain location:  Substernal area Pain quality: tightness   Pain radiates to:  L arm Pain radiates to the back: no   Pain severity:  Mild Onset quality:  Gradual Duration: 4 hrs. Timing:  Constant Progression:  Waxing and waning Chronicity:  New Context comment:  At rest driving Relieved by:  Nothing Associated symptoms: no abdominal pain, no back pain, no cough, no diaphoresis, no fatigue, no fever, no lower extremity edema, no nausea, no near-syncope, no numbness (felt aches in her arm, not numbness), no palpitations, no shortness of breath, no syncope, not vomiting and no weakness   Risk factors: high cholesterol and hypertension   Risk factors: no coronary artery disease, no diabetes mellitus, not female, no prior DVT/PE and no smoking     Past Medical History  Diagnosis Date  . Hyperlipidemia   . GERD (gastroesophageal reflux disease)   . Arthritis   . Depression   . Vitamin D deficiency   . Prediabetes   . Occipital neuralgia   . Essential hypertension     Normal cardiolite 05/2006 EF 71%  . Mini stroke (HCC) 2011   . Hypothyroidism   . Urinary tract infection   . Headache    Past Surgical History  Procedure Laterality Date  . Breast biopsy Right Over 20 years     Benign  . Cholecystectomy    . Bladder surgery    . Tonsillectomy and adenoidectomy    . Knee arthroscopy Left 2011  . Abdominal hysterectomy    . Gastroplication     . Total knee arthroplasty Left 04/17/2015    Procedure: LEFT TOTAL KNEE ARTHROPLASTY;   Surgeon: Durene Romans, MD;  Location: [facility];  Service: Orthopedics;  Laterality: Left;   Family History  Problem Relation Age of Onset  . Heart disease Mother   . Hypertension Mother   . Diabetes Mother   . Heart disease Father   . Breast cancer Maternal Aunt    Social History  Substance Use Topics  . Smoking status: Never Smoker   . Smokeless tobacco: Never Used  . Alcohol Use: No   OB History    No data available     Review of Systems  Constitutional: Negative for fever, chills, diaphoresis and fatigue.  HENT: Positive for congestion and sinus pressure.   Respiratory: Negative for cough, shortness of breath and wheezing.   Cardiovascular: Positive for chest pain. Negative for palpitations, leg swelling, syncope and near-syncope.  Gastrointestinal: Negative for nausea, vomiting and abdominal pain.  Genitourinary: Negative for flank pain.  Musculoskeletal: Negative for back pain.  Skin: Negative for rash.  Neurological: Negative for syncope, weakness and numbness (felt aches in her arm, not numbness).  All other systems reviewed and are negative.     Allergies  Review of patient's allergies indicates no known allergies.  Home Medications   Prior to Admission medications   Medication Sig Start Date End Date Taking? Authorizing Provider  aspirin 325 MG tablet Take 325 mg by mouth daily.   Yes Historical Provider, MD  Biotin 1 MG CAPS Take 1 mg by mouth daily.    Yes Historical Provider, MD  Calcium Carb-Cholecalciferol (CALCIUM-VITAMIN D) 600-400 MG-UNIT TABS Take 1 tablet by mouth daily.   Yes Historical Provider, MD  Cholecalciferol (VITAMIN D3) 2000 UNITS capsule Take 6,000 Units by mouth daily.   Yes Historical Provider, MD  docusate sodium (COLACE) 100 MG capsule Take 1 capsule (100 mg total) by mouth 2 (two) times daily. Patient taking differently: Take 100 mg by mouth daily as needed for mild constipation or moderate constipation.  04/19/15  Yes Lanney Gins,  PA-C  levothyroxine (SYNTHROID, LEVOTHROID) 112 MCG tablet Take 1 tablet (112 mcg total) by mouth daily. 03/22/15  Yes Lucky Cowboy, MD  lisinopril (PRINIVIL,ZESTRIL) 20 MG tablet TAKE 1 TABLET EVERY DAY FOR BLOOD PRESSURE 11/13/15  Yes Lucky Cowboy, MD  MAGNESIUM OXIDE PO Take 400 mg by mouth daily. 400 mg   Yes Historical Provider, MD  methocarbamol (ROBAXIN) 500 MG tablet TAKE 1 TABLET THREE TIMES DAILY IF NEEDED FOR MUSCLE SPASMS 10/12/15  Yes Courtney Forcucci, PA-C  simvastatin (ZOCOR) 40 MG tablet Take 1 tablet (40 mg total) by mouth every evening. 03/22/15 03/21/16 Yes Lucky Cowboy, MD  traZODone (DESYREL) 150 MG tablet Take 1 tablet (150 mg total) by mouth at bedtime. 09/26/15  Yes Courtney Forcucci, PA-C  triamcinolone cream (KENALOG) 0.1 % APPLY 1 APPLICATION TOPICALLY  TWO  TIMES DAILY. Patient taking differently: APPLY 1 APPLICATION TOPICALLY  AT BEDTIME 11/04/15  Yes Lucky Cowboy, MD  LORazepam (ATIVAN) 1 MG tablet Take 1/2 to 1 tablet at bedtime for sleep. Patient taking differently: Take 1 mg by mouth at bedtime. Take 1/2 to 1 tablet at bedtime for sleep. 08/09/15   Lucky Cowboy, MD  omeprazole (PRILOSEC) 40 MG capsule TAKE 1 CAPSULE EVERY DAY 10/02/15   Quentin Mulling, PA-C   BP 158/85 mmHg  Pulse 65  Temp(Src) 98.5 F (36.9 C) (Oral)  Resp 18  Ht 5\' 3"  (1.6 m)  Wt 73.12 kg  BMI 28.56 kg/m2  SpO2 98% Physical Exam  Constitutional: She is oriented to person, place, and time. She appears well-developed and well-nourished. No distress.  HENT:  Head: Normocephalic and atraumatic.  Nose: Nose normal.  Eyes: Conjunctivae are normal.  Neck: Normal range of motion. Neck supple. No JVD present. No tracheal deviation present.  Cardiovascular: Normal rate, regular rhythm and normal heart sounds.   No murmur heard. Pulmonary/Chest: Effort normal and breath sounds normal. No respiratory distress. She has no wheezes. She has no rales. She exhibits tenderness (exquisite left  costochondral ttp).  Abdominal: Soft. Bowel sounds are normal. She exhibits no distension and no mass. There is no tenderness.  Musculoskeletal: Normal range of motion. She exhibits no edema.  No lower extremity edema, calf tenderness, warmth, erythema or palpable cords   Neurological: She is alert and oriented to person, place, and time.  Skin: Skin is warm and dry. No rash noted.  Psychiatric: She has a normal mood and affect.  Nursing note and vitals reviewed.   ED Course  Procedures (including critical care time) Labs Review Labs Reviewed  BASIC METABOLIC PANEL - Abnormal; Notable for the following:    Glucose, Bld 108 (*)    All other components within normal limits  CBC  TROPONIN I  Rosezena Sensor, ED    Imaging Review Dg Chest 2 View  12/13/2015  CLINICAL DATA:  Chest pain, shortness of breath. EXAM: CHEST  2 VIEW COMPARISON:  August 23, 2015. FINDINGS: The heart size and mediastinal contours are within normal limits. Both lungs are clear. Stable mild hiatal hernia is noted. No pneumothorax or pleural effusion is noted. The visualized skeletal structures are unremarkable. IMPRESSION: Stable mild hiatal hernia. No acute cardiopulmonary abnormality seen. Electronically Signed   By: Lupita Raider, M.D.   On: 12/13/2015 14:57   I have personally reviewed and evaluated these images and lab results as part of my medical decision-making.   EKG Interpretation   Date/Time:  Wednesday Dec 13 2015 14:21:08 EDT Ventricular Rate:  86 PR Interval:  146 QRS Duration: 76 QT Interval:  370 QTC Calculation: 442 R Axis:   -14 Text Interpretation:  Normal sinus rhythm Cannot rule out Anterior infarct  , age undetermined Abnormal ECG When compared with ECG of 01/11/2014, No  significant change was found Confirmed by [facility]  MD, DAVID ([id number]) on  12/13/2015 5:10:28 PM      MDM   Final diagnoses:  Chest pain of uncertain etiology    Present with chest pain radiating to left arm.   Multiple risk factors reviewed.   EKG reviewed, compared to prior. No ischemic changes, no TWI or ST segment changes Not hypoxic, no tachy, no s/s DVT, Wells 0, doubt PE.   CXR wo PNA or PTX, no effusions.   EKG not cw pericarditis. She is exquisitely ttp over left costochondral joints, MSK source dx of exclusion.  Does have hx of reflux, esophageal spasm/ GERD dx of exclusion.   Nml mediastinum, pain minimal if at all, no classic dissection sx.   Delta trop neg, very low suspicion for acute coronary syndrome.    HEAR 5. At risk, will need expedited provocative imaging given concerning risk profile.   Admitted to hospitalist, cardiology consulted.       Sofie Rower, MD 12/13/15 6045  Dione Booze, MD 12/14/15 2256

## 2015-12-13 NOTE — ED Notes (Signed)
MD at bedside. 

## 2015-12-13 NOTE — Progress Notes (Signed)
PHARMACIST - PHYSICIAN ORDER COMMUNICATION  CONCERNING: P&T Medication Policy on Herbal Medications  DESCRIPTION:  This patient's order for:  Biotin  has been noted.  This product(s) is classified as an "herbal" or natural product. Due to a lack of definitive safety studies or FDA approval, nonstandard manufacturing practices, plus the potential risk of unknown drug-drug interactions while on inpatient medications, the Pharmacy and Therapeutics Committee does not permit the use of "herbal" or natural products of this type within [facility].   ACTION TAKEN: The pharmacy department is unable to verify this order at this time and your patient has been informed of this safety policy. Please reevaluate patient's clinical condition at discharge and address if the herbal or natural product(s) should be resumed at that time.  Dezi Brauner P. Lawton Dollinger, Pharm.D., BCPS Clinical Pharmacist  

## 2015-12-13 NOTE — Consult Note (Addendum)
Requesting provider: Dr. Rhetta Mura Consulting cardiologist: Dr. Jonelle Sidle  Reason for consultation: Chest discomfort  Clinical Summary Kimberly Cook is a 70 y.o.female with past medical history outlined below, presenting to the [facility] ER today complaining of left arm and chest discomfort. She was evaluated 10 years ago by Dr. Glennon Hamilton and underwent a negative Cardiolite study around that time, has no personal history of obstructive CAD or myocardial infarction. She states that she was driving in her car today when she sudden began to experience a tightness in her left arm, ultimately moving into the chest area. She went on to the grocery store, continued shopping, had worsening chest discomfort and came in for further assessment. She has been given nitroglycerin, states that it made her feel nauseated, possibly some decrease in her chest pain. She has also had some tenderness to palpation of the left side of the thorax in this region. Some trouble with cough and seasonal allergies, no fevers or chills.  Cardiac risk factors include hyperlipidemia, essential hypertension, prediabetes, also prior history of TIA. She follows regularly with her PCP on medical therapy outlined below includes aspirin, lisinopril, and Zocor. Lipid panel from February of this year showed good LDL control at 56.  Initial troponin I levels in the ER are normal. Chest x-ray shows no acute cardiopulmonary findings. Mild hiatal hernia described. ECG shows sinus rhythm with decreased R wave progression anteriorly and nonspecific ST changes. She has not undergone any interval ischemic testing in the last 10 years. No typical exertional chest pain. Sometimes has felt some discomfort at nighttime, possibly reflux.   No Known Allergies  Medications No current facility-administered medications on file prior to encounter.   Current Outpatient Prescriptions on File Prior to Encounter  Medication Sig  Dispense Refill  . aspirin 325 MG tablet Take 325 mg by mouth daily.    . Biotin 1 MG CAPS Take 1 mg by mouth daily.     . Calcium Carb-Cholecalciferol (CALCIUM-VITAMIN D) 600-400 MG-UNIT TABS Take 1 tablet by mouth daily.    . Cholecalciferol (VITAMIN D3) 2000 UNITS capsule Take 6,000 Units by mouth daily.    Marland Kitchen docusate sodium (COLACE) 100 MG capsule Take 1 capsule (100 mg total) by mouth 2 (two) times daily. (Patient taking differently: Take 100 mg by mouth daily as needed for mild constipation or moderate constipation. ) 10 capsule 0  . levothyroxine (SYNTHROID, LEVOTHROID) 112 MCG tablet Take 1 tablet (112 mcg total) by mouth daily. 90 tablet 3  . lisinopril (PRINIVIL,ZESTRIL) 20 MG tablet TAKE 1 TABLET EVERY DAY FOR BLOOD PRESSURE 90 tablet 1  . MAGNESIUM OXIDE PO Take 400 mg by mouth daily. 400 mg    . methocarbamol (ROBAXIN) 500 MG tablet TAKE 1 TABLET THREE TIMES DAILY IF NEEDED FOR MUSCLE SPASMS 90 tablet 1  . simvastatin (ZOCOR) 40 MG tablet Take 1 tablet (40 mg total) by mouth every evening. 90 tablet 3  . traZODone (DESYREL) 150 MG tablet Take 1 tablet (150 mg total) by mouth at bedtime. 90 tablet 1  . triamcinolone cream (KENALOG) 0.1 % APPLY 1 APPLICATION TOPICALLY  TWO  TIMES DAILY. (Patient taking differently: APPLY 1 APPLICATION TOPICALLY  AT BEDTIME) 80 g 3  . LORazepam (ATIVAN) 1 MG tablet Take 1/2 to 1 tablet at bedtime for sleep. (Patient taking differently: Take 1 mg by mouth at bedtime. Take 1/2 to 1 tablet at bedtime for sleep.) 30 tablet 0  . omeprazole (PRILOSEC) 40 MG capsule  TAKE 1 CAPSULE EVERY DAY 90 capsule 1     Past Medical History  Diagnosis Date  . Hyperlipidemia   . GERD (gastroesophageal reflux disease)   . Arthritis   . Depression   . Vitamin D deficiency   . Prediabetes   . Occipital neuralgia   . Essential hypertension     Normal cardiolite 05/2006 EF 71%  . Mini stroke (HCC) 2011   . Hypothyroidism   . Urinary tract infection   . Headache      Past Surgical History  Procedure Laterality Date  . Breast biopsy Right Over 20 years     Benign  . Cholecystectomy    . Bladder surgery    . Tonsillectomy and adenoidectomy    . Knee arthroscopy Left 2011  . Abdominal hysterectomy    . Gastroplication     . Total knee arthroplasty Left 04/17/2015    Procedure: LEFT TOTAL KNEE ARTHROPLASTY;  Surgeon: Durene Romans, MD;  Location: [facility];  Service: Orthopedics;  Laterality: Left;    Family History  Problem Relation Age of Onset  . Heart disease Mother   . Hypertension Mother   . Diabetes Mother   . Heart disease Father   . Breast cancer Maternal Aunt     Social History Kimberly Cook reports that she has never smoked. She has never used smokeless tobacco. Kimberly Cook reports that she does not drink alcohol.  Review of Systems Complete review of systems negative except as otherwise outlined in the clinical summary and also the following. Lingering left knee pain after joint replacement last year.  Physical Examination Blood pressure 125/72, pulse 64, temperature 98 F (36.7 C), temperature source Oral, resp. rate 21, height 5' 3.5" (1.613 m), weight 150 lb (68.04 kg), SpO2 98 %.  Telemetry: Sinus rhythm.  Gen.: Patient is in no distress. HEENT: Conjunctiva and lids normal, oropharynx clear. Neck: Supple, no elevated JVP or carotid bruits, no thyromegaly. Lungs: Clear to auscultation, nonlabored breathing at rest. Cardiac: Regular rate and rhythm, no S3, soft systolic murmur, no pericardial rub. Abdomen: Soft, nontender, bowel sounds present, no guarding or rebound. Extremities: No pitting edema, distal pulses 2+. Skin: Warm and dry. Musculoskeletal: No kyphosis. Neuropsychiatric: Alert and oriented x3, affect grossly appropriate.  Lab Results  Basic Metabolic Panel:  Recent Labs Lab 12/13/15 1430  NA 140  K 3.9  CL 109  CO2 24  GLUCOSE 108*  BUN 16  CREATININE 0.93  CALCIUM 9.5   CBC:  Recent  Labs Lab 12/13/15 1430  WBC 4.0  HGB 13.2  HCT 38.6  MCV 92.1  PLT 166   Cardiac Enzymes:  Recent Labs Lab 12/13/15 1727  TROPONINI <0.03   Imaging Chest x-ray 12/13/2015: FINDINGS: The heart size and mediastinal contours are within normal limits. Both lungs are clear. Stable mild hiatal hernia is noted. No pneumothorax or pleural effusion is noted. The visualized skeletal structures are unremarkable.  IMPRESSION: Stable mild hiatal hernia. No acute cardiopulmonary abnormality seen.  Impression  1. Chest discomfort with both typical and atypical features for unstable angina, onset earlier today. Initial troponin I is normal and ECG is nonspecific overall. No acute cardiopulmonary findings by chest x-ray. Some nonspecific left lateral chest wall soreness, possibly also reflux symptoms. Cardiac risk factor profile suggests at least an intermediate risk however for cardiovascular disease. She has not undergone ischemic testing in 10 years.  2. Essential hypertension, on lisinopril as an outpatient.  3. Hyperlipidemia, on Zocor with recent LDL 56.  4. Reported history of prediabetes, hemoglobin A1c was 5.1 in February.  Recommendations  Patient being admitted to the hospitalist service for further evaluation. Would recommend telemetry, cycling a full set of cardiac markers, and follow-up ECG in the morning. At a minimum she will need to have a follow-up Cardiolite study and echocardiogram for further objective assessment. These studies will be tentatively scheduled for tomorrow. If on the other hand she becomes clinically unstable or cardiac marker trend becomes abnormal, cardiac catheterization can be discussed. Our service will continue to follow with you and make additional recommendations.  Jonelle Sidle, M.D., F.A.C.C.

## 2015-12-13 NOTE — ED Notes (Signed)
Attempted Report 

## 2015-12-13 NOTE — H&P (Addendum)
History and Physical    Kimberly Cook NWG:[id number] DOB: 1946-07-13 DOA: 12/13/2015  Referring MD/NP/PA: Edman Circle, ED resident PCP: Nadean Corwin, MD  Outpatient Specialists: schooler-GI Patient coming from: home  Chief Complaint:  70 y/o ? Known history of Hypertension Hyperlipidemia Prediabetes not on insulin currently EGD around 2015 = gastritis small hiatal hernia Bipolar with predominant anxiety Osteoarthritis left knee status post surgery 04/17/2015 ? TIA Body mass index is 26.15 kg/(m^2).  Stress test neg ? 2007  L sided pressure L arm which started 1000 hrs. 12/13/2015 while driving from [place] to [place] Patients that it was constant, not relieved by anything else Located center of chest radiation to left arm down to mid bicep then up to left jaw Aggravated by potentially driving Described as a pressure when asking her to substitute the word pain for another adjective Severity 5-6/10.  she proceeded to continue her grocery shopping called her son and then was instructed by her son to come to the emergency room  Never had this pain before-nonsmoker-surrounded by 2 husbands as well as 2 sons who have smoked however Father died at age 34 of a heart attack Mother died of hypertension Her son who is 76 has had CAD as well    Review of Systems:  Denies dysuria long travel nausea vomiting shortness of breath blurred vision double vision unilateral weakness melena dysphagia and other findings  As per HPI otherwise 10 point review of systems negative.    Past Medical History  Diagnosis Date  . Hyperlipidemia   . GERD (gastroesophageal reflux disease)   . Arthritis   . Depression   . Vitamin D deficiency   . Prediabetes   . Occipital neuralgia   . Hypertension     Normal cardiolite 05/2006 EF 71%  . Mini stroke (HCC) 2011   . Hypothyroidism   . Urinary tract infection     hx of   . Headache     Past Surgical History  Procedure Laterality  Date  . Breast biopsy Right over 20 years     benign  . Cholecystectomy    . Bladder surgery    . Tonsillectomy and adenoidectomy    . Knee arthroscopy Left 2011  . Abdominal hysterectomy    . Gastroplication     . Total knee arthroplasty Left 04/17/2015    Procedure: LEFT TOTAL KNEE ARTHROPLASTY;  Surgeon: Durene Romans, MD;  Location: [facility];  Service: Orthopedics;  Laterality: Left;     reports that she has never smoked. She has never used smokeless tobacco. She reports that she does not drink alcohol or use illicit drugs.  No Known Allergies  Family History  Problem Relation Age of Onset  . Heart disease Mother   . Hypertension Mother   . Diabetes Mother   . Heart disease Father   . Cancer Maternal Aunt     breast  . Stroke Neg Hx    See above discussion  Prior to Admission medications   Medication Sig Start Date End Date Taking? Authorizing Provider  aspirin 325 MG tablet Take 325 mg by mouth daily.   Yes Historical Provider, MD  Biotin 1 MG CAPS Take 1 mg by mouth daily.    Yes Historical Provider, MD  Calcium Carb-Cholecalciferol (CALCIUM-VITAMIN D) 600-400 MG-UNIT TABS Take 1 tablet by mouth daily.   Yes Historical Provider, MD  Cholecalciferol (VITAMIN D3) 2000 UNITS capsule Take 6,000 Units by mouth daily.   Yes Historical Provider, MD  docusate sodium (COLACE)  100 MG capsule Take 1 capsule (100 mg total) by mouth 2 (two) times daily. Patient taking differently: Take 100 mg by mouth daily as needed for mild constipation or moderate constipation.  04/19/15  Yes Lanney Gins, PA-C  levothyroxine (SYNTHROID, LEVOTHROID) 112 MCG tablet Take 1 tablet (112 mcg total) by mouth daily. 03/22/15  Yes Lucky Cowboy, MD  lisinopril (PRINIVIL,ZESTRIL) 20 MG tablet TAKE 1 TABLET EVERY DAY FOR BLOOD PRESSURE 11/13/15  Yes Lucky Cowboy, MD  MAGNESIUM OXIDE PO Take 400 mg by mouth daily. 400 mg   Yes Historical Provider, MD  methocarbamol (ROBAXIN) 500 MG tablet TAKE 1 TABLET THREE  TIMES DAILY IF NEEDED FOR MUSCLE SPASMS 10/12/15  Yes Courtney Forcucci, PA-C  simvastatin (ZOCOR) 40 MG tablet Take 1 tablet (40 mg total) by mouth every evening. 03/22/15 03/21/16 Yes Lucky Cowboy, MD  traZODone (DESYREL) 150 MG tablet Take 1 tablet (150 mg total) by mouth at bedtime. 09/26/15  Yes Courtney Forcucci, PA-C  triamcinolone cream (KENALOG) 0.1 % APPLY 1 APPLICATION TOPICALLY  TWO  TIMES DAILY. Patient taking differently: APPLY 1 APPLICATION TOPICALLY  AT BEDTIME 11/04/15  Yes Lucky Cowboy, MD  LORazepam (ATIVAN) 1 MG tablet Take 1/2 to 1 tablet at bedtime for sleep. Patient taking differently: Take 1 mg by mouth at bedtime. Take 1/2 to 1 tablet at bedtime for sleep. 08/09/15   Lucky Cowboy, MD  omeprazole (PRILOSEC) 40 MG capsule TAKE 1 CAPSULE EVERY DAY 10/02/15   Quentin Mulling, PA-C    Physical Exam: Filed Vitals:   12/13/15 1420 12/13/15 1744 12/13/15 1831  BP: 157/108 156/91 172/99  Pulse: 87 66 70  Temp: 98 F (36.7 C)    TempSrc: Oral    Resp: 18 21 15   Height: 5' 3.5" (1.613 m)    Weight: 68.04 kg (150 lb)    SpO2: 98% 98% 97%      Constitutional: NAD, calm Eyes: PERRL ENMT: Mucous membranes are moist. Posterior pharynx clear of any exudate or lesions.Normal dentition.  Neck: normal, supple, no masses, no thyromegalyNo JVD noted  Respiratory: clear to auscultation bilaterally, no wheezing, no crackles. Normal respiratory effort.  Cardiovascular: Regular rate and rhythm, no murmurs PMI is nondisplaced  Abdomen: no tenderness, no masses palpated. No hepatosplenomegaly. Bowel sounds positive.  Musculoskeletal: no clubbing / cyanosis. No joint deformity upper and lower extremities. Good ROM, no contractures. Normal muscle tone.  Skin: no rashes, lesions, ulcers. No induration Neurologic: CN 2-12 grossly intact. Sensation intact, DTR normal. Strength 5/5 in all 4.  Psychiatric: Normal judgment and insight. Alert and oriented x 3. Normal mood.    Labs on  Admission: I have personally reviewed following labs and imaging studies  CBC:  Recent Labs Lab 12/13/15 1430  WBC 4.0  HGB 13.2  HCT 38.6  MCV 92.1  PLT 166   Basic Metabolic Panel:  Recent Labs Lab 12/13/15 1430  NA 140  K 3.9  CL 109  CO2 24  GLUCOSE 108*  BUN 16  CREATININE 0.93  CALCIUM 9.5   GFR: Estimated Creatinine Clearance: 52 mL/min (by C-G formula based on Cr of 0.93). Liver Function Tests: No results for input(s): AST, ALT, ALKPHOS, BILITOT, PROT, ALBUMIN in the last 168 hours. No results for input(s): LIPASE, AMYLASE in the last 168 hours. No results for input(s): AMMONIA in the last 168 hours. Coagulation Profile: No results for input(s): INR, PROTIME in the last 168 hours. Cardiac Enzymes:  Recent Labs Lab 12/13/15 1727  TROPONINI <0.03   BNP (  last 3 results) No results for input(s): PROBNP in the last 8760 hours. HbA1C: No results for input(s): HGBA1C in the last 72 hours. CBG: No results for input(s): GLUCAP in the last 168 hours. Lipid Profile: No results for input(s): CHOL, HDL, LDLCALC, TRIG, CHOLHDL, LDLDIRECT in the last 72 hours. Thyroid Function Tests: No results for input(s): TSH, T4TOTAL, FREET4, T3FREE, THYROIDAB in the last 72 hours. Anemia Panel: No results for input(s): VITAMINB12, FOLATE, FERRITIN, TIBC, IRON, RETICCTPCT in the last 72 hours. Urine analysis:    Component Value Date/Time   COLORURINE YELLOW 09/26/2015 1114   APPEARANCEUR CLEAR 09/26/2015 1114   LABSPEC 1.027 09/26/2015 1114   PHURINE 5.0 09/26/2015 1114   GLUCOSEU NEGATIVE 09/26/2015 1114   HGBUR NEGATIVE 09/26/2015 1114   BILIRUBINUR NEGATIVE 09/26/2015 1114   KETONESUR NEGATIVE 09/26/2015 1114   PROTEINUR NEGATIVE 09/26/2015 1114   UROBILINOGEN 0.2 04/06/2015 1015   NITRITE NEGATIVE 09/26/2015 1114   LEUKOCYTESUR TRACE* 09/26/2015 1114    Radiological Exams on Admission: Dg Chest 2 View  12/13/2015  CLINICAL DATA:  Chest pain, shortness of  breath. EXAM: CHEST  2 VIEW COMPARISON:  August 23, 2015. FINDINGS: The heart size and mediastinal contours are within normal limits. Both lungs are clear. Stable mild hiatal hernia is noted. No pneumothorax or pleural effusion is noted. The visualized skeletal structures are unremarkable. IMPRESSION: Stable mild hiatal hernia. No acute cardiopulmonary abnormality seen. Electronically Signed   By: Lupita Raider, M.D.   On: 12/13/2015 14:57     EKG: Independently reviewed. Appears to have slight T-wave depressions across anterior leads but no prior to compare with  Assessment/Plan Principal Problem:   Chest pain of uncertain etiology Active Problems:   Hypertension   Depression   Prediabetes   Hypothyroidism   Morbid obesity (HCC)   BMI 27.0-27.9,adult   Chest pain   Chest pain with moderate risk for cardiac etiology   Patient presents with a history of HTN depression diabetes and has had  GI manage her hiatal hernia and GERD -On her exam her findings are not entirely consistent with musculoskeletal etiology-she still states that there is a "pressure" She received GI cocktail as well as aspirin in the ED I tried in ED to give her 1 dose of nitroglycerin she became nauseated It is reasonable given heart score 4-5 to involve cardiology in her care -We will cycle troponins overnight, obtain a.m. EKG and reassess her in the morning -If cardiology feels that patient does not require further management or care then became probably discharge the patient home tomorrow with close outpatient follow-up for stress testing if needed  DVT prophylaxis: Lovenox  Code Status: Full code  Family Communication: Grandson at the bedside  Disposition Plan: *Probable home discharge a.m.  Consults called: *Cardiology was consulted at my request by the emergency room  Admission status: TELEMETRY overnight at least  Rhetta Mura MD [facility] Pager [phone]    If 7PM-7AM, please  contact night-coverage [email or web address] Password [facility]  12/13/2015, 6:47 PM

## 2015-12-13 NOTE — ED Notes (Signed)
Dr. Mahala Menghini at bedside advised he would like patient to get 1 sl nitro at this time

## 2015-12-14 ENCOUNTER — Observation Stay (HOSPITAL_BASED_OUTPATIENT_CLINIC_OR_DEPARTMENT_OTHER): Payer: Commercial Managed Care - HMO

## 2015-12-14 ENCOUNTER — Encounter (HOSPITAL_COMMUNITY): Payer: Self-pay

## 2015-12-14 ENCOUNTER — Observation Stay (HOSPITAL_COMMUNITY): Payer: Commercial Managed Care - HMO

## 2015-12-14 ENCOUNTER — Other Ambulatory Visit: Payer: Self-pay

## 2015-12-14 ENCOUNTER — Observation Stay (HOSPITAL_COMMUNITY): Payer: Self-pay

## 2015-12-14 DIAGNOSIS — R079 Chest pain, unspecified: Secondary | ICD-10-CM | POA: Diagnosis not present

## 2015-12-14 DIAGNOSIS — E785 Hyperlipidemia, unspecified: Secondary | ICD-10-CM | POA: Diagnosis not present

## 2015-12-14 DIAGNOSIS — R0789 Other chest pain: Secondary | ICD-10-CM | POA: Diagnosis not present

## 2015-12-14 DIAGNOSIS — I10 Essential (primary) hypertension: Secondary | ICD-10-CM | POA: Diagnosis not present

## 2015-12-14 DIAGNOSIS — K219 Gastro-esophageal reflux disease without esophagitis: Secondary | ICD-10-CM | POA: Diagnosis not present

## 2015-12-14 LAB — ECHOCARDIOGRAM COMPLETE
Height: 63 in
Weight: 2579.2 oz

## 2015-12-14 LAB — NM MYOCAR MULTI W/SPECT W/WALL MOTION / EF
Estimated workload: 1 METS
Exercise duration (min): 0 min
Exercise duration (sec): 0 s
MPHR: 149 {beats}/min
Peak HR: 102 {beats}/min
Percent HR: 68 %
Rest HR: 52 {beats}/min

## 2015-12-14 MED ORDER — CARVEDILOL 3.125 MG PO TABS: 3.1250 mg | ORAL_TABLET | Freq: Two times a day (BID) | ORAL | Status: DC

## 2015-12-14 MED ORDER — REGADENOSON 0.4 MG/5ML IV SOLN
INTRAVENOUS | Status: DC
Start: 2015-12-14 — End: 2015-12-14
  Filled 2015-12-14: qty 5

## 2015-12-14 MED ORDER — TECHNETIUM TC 99M SESTAMIBI - CARDIOLITE
30.0000 | Freq: Once | INTRAVENOUS | Status: AC | PRN
  Administered 2015-12-14: 30 via INTRAVENOUS

## 2015-12-14 MED ORDER — TECHNETIUM TC 99M SESTAMIBI - CARDIOLITE
10.0000 | Freq: Once | INTRAVENOUS | Status: AC | PRN
  Administered 2015-12-14: 09:00:00 10 via INTRAVENOUS

## 2015-12-14 NOTE — Progress Notes (Signed)
  Echocardiogram 2D Echocardiogram has been performed.  Delcie Roch 12/14/2015, 2:42 PM

## 2015-12-14 NOTE — Progress Notes (Signed)
1 day lexiscan stress test completed without significant complication. Pending final result by [facility]  Signed, Azalee Course PA Pager: [phone]

## 2015-12-14 NOTE — Plan of Care (Signed)
Problem: Education: Goal: Knowledge of [facility] Education information/materials will improve Outcome: Completed/Met Date Met:  12/14/15 Pt educated on pain scale, safety measures, and current/new medications. Pt verbalized understanding. Reviewed plan of care with pt and family.

## 2015-12-14 NOTE — Care Management Obs Status (Signed)
MEDICARE OBSERVATION STATUS NOTIFICATION   Patient Details  Name: Kimberly Cook MRN: [id number] Date of Birth: 06/06/1946   Medicare Observation Status Notification Given:  Yes    Elliot Cousin, RN 12/14/2015, 5:49 PM

## 2015-12-14 NOTE — Progress Notes (Signed)
Report called via Windell Moulding RN from ED with a verbal report updating patient's reason for admission, VS, labs, CXR, EKG, orders and patient's general condition. Patient will be coming to 3W-17, awaiting arrival. 5/10 2100 Patient arrived to 3W-17 via stretcher in stable condition and assessment started shortly after arrival.

## 2015-12-14 NOTE — Consult Note (Signed)
   [facility] CM Inpatient Consult   12/14/2015  Kimberly Cook [birth year] [id number]   Patient evaluated for community based chronic disease management services with [facility] Care Management Program as a benefit of patient's [employer] Medicare Insurance/[facility] ACO Registry. Spoke with patient at bedside to explain [facility] Care Management services. Patient verbalized interest in post hospital follow up especially with her chest pain issues. Patient endorses Dr. Lucky Cowboy as her primary care provider.  Consent form signed.    Patient will receive post hospital discharge call and will be evaluated for monthly home visits for assessments and disease process education.  Left contact information and [facility] literature with the patient at bedside. Made Inpatient Case Manager aware that [facility] Care Management following. Of note, [facility] Care Management services does not replace or interfere with any services that are arranged by inpatient case management or social work.  For additional questions or referrals please contact:    Charlesetta Shanks, RN BSN CCM [facility]  [phone] business mobile phone Toll free office [phone]

## 2015-12-14 NOTE — Discharge Summary (Signed)
Physician Discharge Summary  Kimberly Cook ZOX:[id number] DOB: July 14, 1946 DOA: 12/13/2015  PCP: Nadean Corwin, MD  Admit date: 12/13/2015 Discharge date: 12/14/2015  Time spent: 35 minutes  Recommendations for Outpatient Follow-up:  1. msk pain most likely etiology 2. Patient to follow with Dr. Charlann Boxer 3. Recommend outpatient occupational therapy regarding left shoulder complex pain  Discharge Diagnoses:  Principal Problem:   Chest pain of uncertain etiology Active Problems:   Hypertension   Depression   Prediabetes   Hypothyroidism   Morbid obesity (HCC)   BMI 27.0-27.9,adult   Chest pain   Chest pain with moderate risk for cardiac etiology   Discharge Condition: fair  Diet recommendation: hh low salt  Filed Weights   12/13/15 1420 12/13/15 2042 12/14/15 0506  Weight: 68.04 kg (150 lb) 73.12 kg (161 lb 3.2 oz) 73.12 kg (161 lb 3.2 oz)    History of present illness:  71 y/o ? Known history of Hypertension Hyperlipidemia Prediabetes not on insulin currently EGD around 2015 = gastritis small hiatal hernia Bipolar with predominant anxiety Osteoarthritis left knee status post surgery 04/17/2015 ? TIA Body mass index is 26.15 kg/(m^2).  Stress test neg ? 2007  L sided pressure L arm which started 1000 hrs. 12/13/2015 while driving from [place] to [place] Patients that it was constant, not relieved by anything else Located center of chest radiation to left arm down to mid bicep then up to left jaw Aggravated by potentially driving Described as a pressure when asking her to substitute the word pain for another adjective Severity 5-6/10.  Hospital Course:  Ruled out by CE's x 3 Pain reproducible with direct chest pressure Pain exacerbated by provacative beer- can empty test,  Cross body test as well as other manoevres  Procedures: Stress test 5/11 Study Result      There was no ST segment deviation noted during stress.  No T wave inversion was  noted during stress.  Defect 1: There is a medium defect of mild severity present in the basal inferior, mid inferior and apex location.  This is a low risk study.  The left ventricular ejection fraction is hyperdynamic (>65%).  Nuclear stress EF: 68%.   ECHO - Left ventricle: The cavity size was normal. Wall thickness was  normal. Systolic function was normal. The estimated ejection  fraction was in the range of 55% to 60%. Wall motion was normal;  there were no regional wall motion abnormalities. Doppler  parameters are consistent with abnormal left ventricular  relaxation (grade 1 diastolic dysfunction).  Impressions:  - Normal LV systolic function; grade 1 diastolic dysfunction; trace  MR; mild TR. Consultations:  cardiology  Discharge Exam: Filed Vitals:   12/14/15 1029 12/14/15 1031  BP: 127/74 127/74  Pulse:    Temp:    Resp:      General: well, no issue Cardiovascular: s1 s2 no m/r/g Respiratory: clear no added sound  Discharge Instructions   Discharge Instructions    Diet - low sodium heart healthy    Complete by:  As directed      Discharge instructions    Complete by:  As directed   Your heart issues were not thought to be significant by your testing I am not completely sure why you did have chest pain but it seems more musculoskeletal We have asked therapy to see you in the hospital to teach you some exercises Recommedn that you follow up with Dr. Charlann Boxer     Increase activity slowly    Complete  by:  As directed           Current Discharge Medication List    CONTINUE these medications which have NOT CHANGED   Details  aspirin 325 MG tablet Take 325 mg by mouth daily.    Biotin 1 MG CAPS Take 1 mg by mouth daily.     Calcium Carb-Cholecalciferol (CALCIUM-VITAMIN D) 600-400 MG-UNIT TABS Take 1 tablet by mouth daily.    Cholecalciferol (VITAMIN D3) 2000 UNITS capsule Take 6,000 Units by mouth daily.    docusate sodium (COLACE) 100 MG  capsule Take 1 capsule (100 mg total) by mouth 2 (two) times daily. Qty: 10 capsule, Refills: 0    levothyroxine (SYNTHROID, LEVOTHROID) 112 MCG tablet Take 1 tablet (112 mcg total) by mouth daily. Qty: 90 tablet, Refills: 3   Associated Diagnoses: Hypothyroidism, unspecified hypothyroidism type    lisinopril (PRINIVIL,ZESTRIL) 20 MG tablet TAKE 1 TABLET EVERY DAY FOR BLOOD PRESSURE Qty: 90 tablet, Refills: 1    MAGNESIUM OXIDE PO Take 400 mg by mouth daily. 400 mg    methocarbamol (ROBAXIN) 500 MG tablet TAKE 1 TABLET THREE TIMES DAILY IF NEEDED FOR MUSCLE SPASMS Qty: 90 tablet, Refills: 1    simvastatin (ZOCOR) 40 MG tablet Take 1 tablet (40 mg total) by mouth every evening. Qty: 90 tablet, Refills: 3   Associated Diagnoses: Hyperlipidemia    traZODone (DESYREL) 150 MG tablet Take 1 tablet (150 mg total) by mouth at bedtime. Qty: 90 tablet, Refills: 1    triamcinolone cream (KENALOG) 0.1 % APPLY 1 APPLICATION TOPICALLY  TWO  TIMES DAILY. Qty: 80 g, Refills: 3    LORazepam (ATIVAN) 1 MG tablet Take 1/2 to 1 tablet at bedtime for sleep. Qty: 30 tablet, Refills: 0    omeprazole (PRILOSEC) 40 MG capsule TAKE 1 CAPSULE EVERY DAY Qty: 90 capsule, Refills: 1       No Known Allergies    The results of significant diagnostics from this hospitalization (including imaging, microbiology, ancillary and laboratory) are listed below for reference.    Significant Diagnostic Studies: Dg Chest 2 View  12/13/2015  CLINICAL DATA:  Chest pain, shortness of breath. EXAM: CHEST  2 VIEW COMPARISON:  August 23, 2015. FINDINGS: The heart size and mediastinal contours are within normal limits. Both lungs are clear. Stable mild hiatal hernia is noted. No pneumothorax or pleural effusion is noted. The visualized skeletal structures are unremarkable. IMPRESSION: Stable mild hiatal hernia. No acute cardiopulmonary abnormality seen. Electronically Signed   By: Lupita Raider, M.D.   On: 12/13/2015 14:57    Nm Myocar Multi W/spect W/wall Motion / Ef  12/14/2015   There was no ST segment deviation noted during stress.  No T wave inversion was noted during stress.  Defect 1: There is a medium defect of mild severity present in the basal inferior, mid inferior and apex location.  This is a low risk study.  The left ventricular ejection fraction is hyperdynamic (>65%).  Nuclear stress EF: 68%.     Microbiology: No results found for this or any previous visit (from the past 240 hour(s)).   Labs: Basic Metabolic Panel:  Recent Labs Lab 12/13/15 1430  NA 140  K 3.9  CL 109  CO2 24  GLUCOSE 108*  BUN 16  CREATININE 0.93  CALCIUM 9.5   Liver Function Tests: No results for input(s): AST, ALT, ALKPHOS, BILITOT, PROT, ALBUMIN in the last 168 hours. No results for input(s): LIPASE, AMYLASE in the last 168 hours. No  results for input(s): AMMONIA in the last 168 hours. CBC:  Recent Labs Lab 12/13/15 1430  WBC 4.0  HGB 13.2  HCT 38.6  MCV 92.1  PLT 166   Cardiac Enzymes:  Recent Labs Lab 12/13/15 1727  TROPONINI <0.03   BNP: BNP (last 3 results) No results for input(s): BNP in the last 8760 hours.  ProBNP (last 3 results) No results for input(s): PROBNP in the last 8760 hours.  CBG: No results for input(s): GLUCAP in the last 168 hours.     SignedRhetta Mura MD   [facility] 12/14/2015, 3:38 PM

## 2015-12-14 NOTE — Progress Notes (Signed)
Patient Name: Kimberly Cook Date of Encounter: 12/14/2015  Primary Cardiologist: new   Principal Problem:   Chest pain of uncertain etiology Active Problems:   Hypertension   Depression   Prediabetes   Hypothyroidism   Morbid obesity (HCC)   BMI 27.0-27.9,adult   Chest pain   Chest pain with moderate risk for cardiac etiology    SUBJECTIVE  Chest discomfort and L arm pain started at rest yesterday while driving. Has since eased off.  CURRENT MEDS . aspirin EC  81 mg Oral Daily  . carvedilol  3.125 mg Oral BID WC  . heparin  5,000 Units Subcutaneous Q8H  . levothyroxine  112 mcg Oral QAC breakfast  . lisinopril  20 mg Oral Daily  . LORazepam  1 mg Oral QHS  . magnesium oxide  400 mg Oral Daily  . methocarbamol  500 mg Oral TID  . pantoprazole  40 mg Oral Daily  . regadenoson  0.4 mg Intravenous Once  . simvastatin  40 mg Oral QPM  . traZODone  150 mg Oral QHS    OBJECTIVE  Filed Vitals:   12/13/15 2042 12/13/15 2129 12/14/15 0506 12/14/15 0645  BP: 159/85 158/85  114/76  Pulse: 65   58  Temp: 98.5 F (36.9 C)   97.6 F (36.4 C)  TempSrc: Oral   Oral  Resp: 18   18  Height:  (1.6 m)     Weight: 161 lb 3.2 oz (73.12 kg)  161 lb 3.2 oz (73.12 kg)   SpO2: 98%   98%    Intake/Output Summary (Last 24 hours) at 12/14/15 1018 Last data filed at 12/14/15 0100  Gross per 24 hour  Intake    360 ml  Output    425 ml  Net    -65 ml   Filed Weights   12/13/15 1420 12/13/15 2042 12/14/15 0506  Weight: 150 lb (68.04 kg) 161 lb 3.2 oz (73.12 kg) 161 lb 3.2 oz (73.12 kg)    PHYSICAL EXAM  General: Pleasant, NAD. Neuro: Alert and oriented X 3. Moves all extremities spontaneously. Psych: Normal affect. HEENT:  Normal  Neck: Supple without bruits or JVD. Lungs:  Resp regular and unlabored, CTA. Heart: RRR no s3, s4, or murmurs. Abdomen: Soft, non-tender, non-distended, BS + x 4.  Extremities: No clubbing, cyanosis or edema. DP/PT/Radials 2+ and equal  bilaterally.  Accessory Clinical Findings  CBC  Recent Labs  12/13/15 1430  WBC 4.0  HGB 13.2  HCT 38.6  MCV 92.1  PLT 166   Basic Metabolic Panel  Recent Labs  12/13/15 1430  NA 140  K 3.9  CL 109  CO2 24  GLUCOSE 108*  BUN 16  CREATININE 0.93  CALCIUM 9.5   Cardiac Enzymes  Recent Labs  12/13/15 1727  TROPONINI <0.03      ECG  NSR with TWI in V1-V2. However TWI in V2 resolved by the time she was hooked up to stress test EKG machine  Echocardiogram  pending    Radiology/Studies  Dg Chest 2 View  12/13/2015  CLINICAL DATA:  Chest pain, shortness of breath. EXAM: CHEST  2 VIEW COMPARISON:  August 23, 2015. FINDINGS: The heart size and mediastinal contours are within normal limits. Both lungs are clear. Stable mild hiatal hernia is noted. No pneumothorax or pleural effusion is noted. The visualized skeletal structures are unremarkable. IMPRESSION: Stable mild hiatal hernia. No acute cardiopulmonary abnormality seen. Electronically Signed   By: Lupita Raider,  M.D.   On: 12/13/2015 14:57    ASSESSMENT AND PLAN  1. Chest pain  - trop negative x1, no serial trop overnight.   - cardiac risk factor include FHx, age, HTN, HLD, prediabetes  - pending echo and lexiscan myoview, if normal, likely discharge.   2. HTN: stable  3. HLD: on zocor  Signed, Amedeo Plenty Pager: [phone]  Patient seen and examined  CP appears to be muscular  With palpation ot Lchest can bring on the pains she was having   Lungs Clear  Card RRR  No murmurs  Ext without edema  Myoview is normal  LVEF normal Echo is normal.    OK to D/C pt home Primary MD can follow BP.  Dietrich Pates

## 2015-12-15 ENCOUNTER — Other Ambulatory Visit: Payer: Self-pay

## 2015-12-15 NOTE — Patient Outreach (Signed)
Transition of care: Discharge date 12/14/2015 Referral date 12/15/2015 Outreach date 12/15/2015  Placed call to patient who reports that she is weak. States that her stress test was negative but she continues to have neck pain and neck tightness.  Reports that she has had decreased energy for 3 months. Reports all her testing is negative.  Reports large amounts of weight loss since her knee surgery 8 months ago.    Patient reports that she follows her low salt diet and weighs daily.  Reports that she has follow up with primary MD on Monday.  Reports that she has all her medications and understands how to take them.  Filed Vitals:   12/15/15 1815  Weight: 150 lb (68.04 kg)    Denies any new concerns. Outpatient Encounter Prescriptions as of 12/15/2015  Medication Sig Note  . aspirin 325 MG tablet Take 325 mg by mouth daily.   . Biotin 1 MG CAPS Take 1 mg by mouth daily.    . Calcium Carb-Cholecalciferol (CALCIUM-VITAMIN D) 600-400 MG-UNIT TABS Take 1 tablet by mouth daily.   . Cholecalciferol (VITAMIN D3) 2000 UNITS capsule Take 6,000 Units by mouth daily. 12/15/2015: Reports taking 5000 units per day.  . docusate sodium (COLACE) 100 MG capsule Take 1 capsule (100 mg total) by mouth 2 (two) times daily. (Patient taking differently: Take 100 mg by mouth daily as needed for mild constipation or moderate constipation. ) 12/15/2015: Takes as needed only  . levothyroxine (SYNTHROID, LEVOTHROID) 112 MCG tablet Take 1 tablet (112 mcg total) by mouth daily.   Marland Kitchen lisinopril (PRINIVIL,ZESTRIL) 20 MG tablet TAKE 1 TABLET EVERY DAY FOR BLOOD PRESSURE   . MAGNESIUM OXIDE PO Take 400 mg by mouth daily. 400 mg   . methocarbamol (ROBAXIN) 500 MG tablet TAKE 1 TABLET THREE TIMES DAILY IF NEEDED FOR MUSCLE SPASMS   . omeprazole (PRILOSEC) 40 MG capsule TAKE 1 CAPSULE EVERY DAY 12/15/2015: Is taking only as needed  . simvastatin (ZOCOR) 40 MG tablet Take 1 tablet (40 mg total) by mouth every evening.   . traZODone  (DESYREL) 150 MG tablet Take 1 tablet (150 mg total) by mouth at bedtime.   . triamcinolone cream (KENALOG) 0.1 % APPLY 1 APPLICATION TOPICALLY  TWO  TIMES DAILY. (Patient taking differently: APPLY 1 APPLICATION TOPICALLY  AT BEDTIME)   . carvedilol (COREG) 3.125 MG tablet Take 1 tablet (3.125 mg total) by mouth 2 (two) times daily with a meal. (Patient not taking: Reported on 12/15/2015) 12/15/2015: Never taken this medication  . LORazepam (ATIVAN) 1 MG tablet Take 1/2 to 1 tablet at bedtime for sleep. (Patient not taking: Reported on 12/15/2015)    No facility-administered encounter medications on file as of 12/15/2015.    Plan: Will continue weekly transition of care calls. Home visit planned for 12/20/2015. Confirmed address. Provided my contact information. This note and barrier letter sent to primary MD.  [facility] CM Care Plan Problem One        Most Recent Value   Care Plan Problem One  Recent admission for chest pain.   Role Documenting the Problem One  Care Management Coordinator   Care Plan for Problem One  Active   [facility]N Long Term Goal (31-90 days)  Patient will not readmit to the hospital in the next 31 days.   THN Long Term Goal Start Date  12/08/15   Interventions for Problem One Long Term Goal  Reviewed importance of follow up with MD. Scheduled home visit. Reviewed importance of taking  all medications as prescribed.   THN CM Short Term Goal #1 (0-30 days)  Patient will follow up with primary md within 7 days of discharge   THN CM Short Term Goal #1 Start Date  12/15/15   Interventions for Short Term Goal #1  encouraged patient to discuss concerns with MD about chest pain. Reviewed importance to call MD with changes in symtoms    THN CM Short Term Goal #2 (0-30 days)  Pateint will weigh and record weights daily for the next 30 days   THN CM Short Term Goal #2 Start Date  12/15/15   Interventions for Short Term Goal #2  Reviewed importance of daily weights and low salt diet.     Rowe Pavy, RN, BSN, CEN [facility] [phone]

## 2015-12-18 ENCOUNTER — Other Ambulatory Visit: Payer: Self-pay | Admitting: Internal Medicine

## 2015-12-18 ENCOUNTER — Encounter: Payer: Self-pay | Admitting: Internal Medicine

## 2015-12-18 ENCOUNTER — Ambulatory Visit (INDEPENDENT_AMBULATORY_CARE_PROVIDER_SITE_OTHER): Payer: Commercial Managed Care - HMO | Admitting: Internal Medicine

## 2015-12-18 VITALS — BP 122/88 | HR 56 | Temp 97.3°F | Resp 16 | Ht 64.0 in | Wt 161.2 lb

## 2015-12-18 DIAGNOSIS — M940 Chondrocostal junction syndrome [Tietze]: Secondary | ICD-10-CM | POA: Diagnosis not present

## 2015-12-18 DIAGNOSIS — E785 Hyperlipidemia, unspecified: Secondary | ICD-10-CM | POA: Diagnosis not present

## 2015-12-18 DIAGNOSIS — I10 Essential (primary) hypertension: Secondary | ICD-10-CM | POA: Diagnosis not present

## 2015-12-18 MED ORDER — PREDNISONE 20 MG PO TABS: ORAL_TABLET | ORAL | Status: DC

## 2015-12-18 NOTE — Progress Notes (Signed)
Patient ID: Kimberly Cook, female   DOB: 1946/07/15, 70 y.o.   MRN: [id number]  [facility] ADULT & ADOLESCENT INTERNAL MEDICINE                       Lucky Cowboy, M.D.        Dyanne Carrel. Steffanie Dunn, P.A.-C       Terri Piedra, P.A.-C   [facility]                [address]                [place], [place]. [address] Telephone [phone] Telefax [phone] _________________________________________________________________________     This very nice 70 y.o. [facility] presents for 5 days post hospital follow up with Chest pain. She is also followed for Hypertension, Hyperlipidemia, Pre-Diabetes and Vitamin D Deficiency. Patient was hospitalized 5/10-5/06/2016 after being admitted with Left precordial CP radiating to the Left arm & chest tightness occuring while driving and she had negative cardiac enzymes ruling out ACS. Post hospital patient contacted to schedule f/u . 2D Stress Echocardiogram was essentially WNL and stress Myocardial scan was likewise negative/normal with EF 68%. No associated GI sx's as N/V, and no dyspnea or diaphoresis.      Patient is treated for HTN & BP has been controlled at home. Today's BP: 122/88 mmHg. Patient has had no complaints of any cardiac type chest pain, palpitations, dyspnea/orthopnea/PND, dizziness, claudication, or dependent edema.     Hyperlipidemia is controlled with diet & meds. Patient denies myalgias or other med SE's. Last Lipids were at goal with Cholesterol 136; HDL 58; LDL 56; Triglycerides 108 on 09/26/2015.     Also, the patient has history of PreDiabetes and has had no symptoms of reactive hypoglycemia, diabetic polys, paresthesias or visual blurring.  Last A1c was improved since weight loss and was 5.1% on 09/26/2015.     Further, the patient also has history of Vitamin D Deficiency and supplements vitamin D without any suspected side-effects. Last vitamin D was 51 on 09/26/2015.  Medication Sig  . aspirin 325  MG tablet Take 325 mg by mouth daily.  . Biotin 1 MG CAPS Take 1 mg by mouth daily.   Marland Kitchen CALCIUM-VITAMIN D 600-400  Take 1 tablet by mouth daily.  Marland Kitchen VITAMIN D 2000 UNITS  Take 6,000 Units by mouth daily.  Marland Kitchen COLACE 100 MG capsule Take 100 mg by mouth daily as needed for mild constipation or moderate constipation. )  . levothyroxine  112 MCG tablet Take 1 tablet (112 mcg total) by mouth daily.  Marland Kitchen lisinopril 20 MG tablet TAKE 1 TABLET EVERY DAY FOR BLOOD PRESSURE  . MAGNESIUM OXIDE PO Take 400 mg by mouth daily. 400 mg  . omeprazole  40 MG capsule TAKE 1 CAPSULE EVERY DAY  . simvastatin  40 MG tablet Take 1 tablet (40 mg total) by mouth every evening.  . traZODone  150 MG tablet Take 1 tablet (150 mg total) by mouth at bedtime.  . triamcinolone crm  0.1 % APPLY 1 APPLICATION TOPICALLY  AT BEDTIME)  . methocarbamol (ROBAXIN) 500 MG  TAKE 1 TABLET THREE TIMES DAILY IF NEEDED FOR MUSCLE SPASMS  . LORazepam (ATIVAN) 1 MG tablet Take 1/2 to 1 tablet at bedtime for sleep. (Patient not taking: Reported on 12/15/2015)   No Known Allergies  PMHx:   Past Medical History  Diagnosis Date  . Hyperlipidemia   . GERD (gastroesophageal reflux disease)   .  Arthritis   . Depression   . Vitamin D deficiency   . Prediabetes   . Occipital neuralgia   . Essential hypertension     Normal cardiolite 05/2006 EF 71%  . Mini stroke (HCC) 2011   . Hypothyroidism   . Urinary tract infection   . Headache    Immunization History  Administered Date(s) Administered  . DT 09/26/2015  . Influenza, High Dose Seasonal PF 06/16/2015  . Influenza-Unspecified 04/18/2013, 04/05/2014  . PPD Test 04/19/2015  . Pneumococcal Conjugate-13 06/16/2015  . Pneumococcal-Unspecified 08/18/2010  . Td 08/18/2005  . Zoster 08/18/2005   Past Surgical History  Procedure Laterality Date  . Breast biopsy Right Over 20 years     Benign  . Cholecystectomy    . Bladder surgery    . Tonsillectomy and adenoidectomy    . Knee  arthroscopy Left 2011  . Abdominal hysterectomy    . Gastroplication     . Total knee arthroplasty Left 04/17/2015    Procedure: LEFT TOTAL KNEE ARTHROPLASTY;  Surgeon: Durene Romans, MD;  Location: [facility];  Service: Orthopedics;  Laterality: Left;   FHx:    Reviewed / unchanged  SHx:    Reviewed / unchanged  Systems Review:  Constitutional: Denies fever, chills, wt changes, headaches, insomnia, fatigue, night sweats, change in appetite. Eyes: Denies redness, blurred vision, diplopia, discharge, itchy, watery eyes.  ENT: Denies discharge, congestion, post nasal drip, epistaxis, sore throat, earache, hearing loss, dental pain, tinnitus, vertigo, sinus pain, snoring.  CV: Denies chest pain, palpitations, irregular heartbeat, syncope, dyspnea, diaphoresis, orthopnea, PND, claudication or edema. Respiratory: denies cough, dyspnea, DOE, pleurisy, hoarseness, laryngitis, wheezing.  Gastrointestinal: Denies dysphagia, odynophagia, heartburn, reflux, water brash, abdominal pain or cramps, nausea, vomiting, bloating, diarrhea, constipation, hematemesis, melena, hematochezia  or hemorrhoids. Genitourinary: Denies dysuria, frequency, urgency, nocturia, hesitancy, discharge, hematuria or flank pain. Musculoskeletal: Denies arthralgias, myalgias, stiffness, jt. swelling, pain, limping or strain/sprain.  Skin: Denies pruritus, rash, hives, warts, acne, eczema or change in skin lesion(s). Neuro: No weakness, tremor, incoordination, spasms, paresthesia or pain. Psychiatric: Denies confusion, memory loss or sensory loss. Endo: Denies change in weight, skin or hair change.  Heme/Lymph: No excessive bleeding, bruising or enlarged lymph nodes.  Physical Exam  BP 122/88 mmHg  Pulse 56  Temp(Src) 97.3 F (36.3 C)  Resp 16  Ht 5\' 4"  (1.626 m)  Wt 161 lb 3.2 oz (73.12 kg)  BMI 27.66 kg/m2  Appears well nourished and in no distress. Eyes: PERRLA, EOMs, conjunctiva no swelling or erythema. Sinuses: No  frontal/maxillary tenderness ENT/Mouth: EAC's clear, TM's nl w/o erythema, bulging. Nares clear w/o erythema, swelling, exudates. Oropharynx clear without erythema or exudates. Oral hygiene is good. Tongue normal, non obstructing. Hearing intact.  Neck: Supple. Thyroid nl. Car 2+/2+ without bruits, nodes or JVD. Chest: Respirations nl with BS clear & equal w/o rales, rhonchi, wheezing or stridor. Exquisite tenderness bilateral parasternal areas.  Cor: Heart sounds normal w/ regular rate and rhythm without sig. murmurs, gallops, clicks, or rubs. Peripheral pulses normal and equal  without edema.  Abdomen: Soft & bowel sounds normal. Non-tender w/o guarding, rebound, hernias, masses, or organomegaly.  Lymphatics: Unremarkable.  Musculoskeletal: Full ROM all peripheral extremities, joint stability, 5/5 strength, and normal gait.  Skin: Warm, dry without exposed rashes, lesions or ecchymosis apparent.  Neuro: Cranial nerves intact, reflexes equal bilaterally. Sensory-motor testing grossly intact. Tendon reflexes grossly intact.  Pysch: Alert & oriented x 3.  Insight and judgement nl & appropriate. No ideations.  Assessment  and Plan:  1. Costochondritis  - predniSONE (DELTASONE) 20 MG tablet; 1 tab 3 x day for 3 days, then 1 tab 2 x day for 3 days, then 1 tab 1 x day for 5 days  Dispense: 20 tablet; Refill: 0  2. Essential hypertension   3. Hyperlipidemia   Recommended regular exercise, BP monitoring, weight control, and discussed med and SE's. Recommended labs to assess and monitor clinical status. Further disposition pending results of labs. Over 40 minutes of exam, counseling, chart review was performed

## 2015-12-18 NOTE — Patient Instructions (Signed)

## 2015-12-20 ENCOUNTER — Other Ambulatory Visit: Payer: Self-pay

## 2015-12-20 NOTE — Patient Outreach (Signed)
[facility]) Care Management   12/20/2015  Kimberly Cook Apr 23, 1946 [id number]  Kimberly Cook is an 70 y.o. female 0930 Arrive for home visit.   Subjective: Patient reports that she saw primary MD on Monday. Reports that she was prescribed prednisone for chest wall pain. Reports that she feels great today.  Patient reports that she has good family support. States that she was very upset with not knowing what had caused her previous chest pain that landed her in the hospital.  Patient reports that she has recently had little energy.  Patient reports that she is managing her medication well. States that she is very active and wants to continue to be very active.  Reports that her arthritis often bothers her. Patient has an additional follow up with primary MD on 12/28/2015  Objective:   Filed Vitals:   12/20/15 0950  BP: 124/62  Pulse: 54  Resp: 18  Height: 1.6 m (_0 )  Weight: 150 lb (68.04 kg)  SpO2: 98%   Review of Systems  Constitutional: Negative.   HENT: Negative.   Eyes: Negative.        Uses reading glasses  Respiratory: Negative.   Cardiovascular: Negative for chest pain, orthopnea, claudication and leg swelling.       Denies any current chest pain.   Gastrointestinal: Negative.   Genitourinary: Negative.   Musculoskeletal: Positive for joint pain.       Knee pain, lower back pain  Skin: Negative.   Neurological: Negative.   Endo/Heme/Allergies: Negative.   Psychiatric/Behavioral: Negative.     Physical Exam  Constitutional: She is oriented to person, place, and time. She appears well-developed and well-nourished.  Cardiovascular: Normal rate and intact distal pulses.   Bradycardiac.  Reports heart rate normally in the 50's  Respiratory: Effort normal and breath sounds normal.  Lungs clear  GI: Soft. Bowel sounds are normal.  Musculoskeletal: Normal range of motion. She exhibits no edema.  Neurological: She is alert and oriented to  person, place, and time.  Skin: Skin is warm and dry.  Psychiatric: She has a normal mood and affect. Her behavior is normal. Judgment and thought content normal.    Encounter Medications:   Outpatient Encounter Prescriptions as of 12/20/2015  Medication Sig Note  . acetaminophen (TYLENOL) 500 MG tablet Take 500 mg by mouth 3 (three) times daily.   Marland Kitchen aspirin 325 MG tablet Take 325 mg by mouth daily.   . Biotin 1 MG CAPS Take 1 mg by mouth daily.    . Calcium Carb-Cholecalciferol (CALCIUM-VITAMIN D) 600-400 MG-UNIT TABS Take 1 tablet by mouth daily.   . Cholecalciferol (VITAMIN D3) 2000 UNITS capsule Take 6,000 Units by mouth daily. 12/15/2015: Reports taking 5000 units per day.  . docusate sodium (COLACE) 100 MG capsule Take 1 capsule (100 mg total) by mouth 2 (two) times daily. (Patient taking differently: Take 100 mg by mouth daily as needed for mild constipation or moderate constipation. ) 12/15/2015: Takes as needed only  . folic acid (FOLVITE) 786 MCG tablet Take 400 mcg by mouth daily.   Marland Kitchen levothyroxine (SYNTHROID, LEVOTHROID) 112 MCG tablet Take 1 tablet (112 mcg total) by mouth daily.   Marland Kitchen lisinopril (PRINIVIL,ZESTRIL) 20 MG tablet TAKE 1 TABLET EVERY DAY FOR BLOOD PRESSURE   . methocarbamol (ROBAXIN) 500 MG tablet TAKE 1 TABLET THREE TIMES DAILY IF NEEDED FOR MUSCLE SPASMS   . omeprazole (PRILOSEC) 40 MG capsule TAKE 1 CAPSULE EVERY DAY 12/15/2015: Is taking only as  needed  . predniSONE (DELTASONE) 20 MG tablet 1 tab 3 x day for 3 days, then 1 tab 2 x day for 3 days, then 1 tab 1 x day for 5 days   . simvastatin (ZOCOR) 40 MG tablet Take 1 tablet (40 mg total) by mouth every evening.   . traZODone (DESYREL) 150 MG tablet Take 1 tablet (150 mg total) by mouth at bedtime.   . triamcinolone cream (KENALOG) 0.1 % APPLY 1 APPLICATION TOPICALLY  TWO  TIMES DAILY. (Patient taking differently: APPLY 1 APPLICATION TOPICALLY  AT BEDTIME)   . MAGNESIUM OXIDE PO Take 400 mg by mouth daily. Reported on  12/20/2015    No facility-administered encounter medications on file as of 12/20/2015.    Functional Status:   In your present state of health, do you have any difficulty performing the following activities: 12/20/2015 12/18/2015  Hearing? N N  Vision? Y N  Difficulty concentrating or making decisions? N N  Walking or climbing stairs? N N  Dressing or bathing? N N  Doing errands, shopping? N N  Preparing Food and eating ? N -  Using the Toilet? N -  In the past six months, have you accidently leaked urine? N -  Do you have problems with loss of bowel control? Y -  Managing your Medications? N -  Managing your Finances? N -  Housekeeping or managing your Housekeeping? N -    Fall/Depression Screening:    PHQ 2/9 Scores 12/20/2015 12/18/2015 06/17/2015 01/11/2015 04/05/2014 02/17/2014  PHQ - 2 Score 2 0 0 _0 PHQ- 9 Score 4 - - 5 6 -   Fall Risk  12/20/2015 12/18/2015 06/17/2015 01/11/2015 04/05/2014  Falls in the past year? Yes No No Yes Yes  Number falls in past yr: 2 or more - - 2 or more 2 or more  Injury with Fall? Yes - - No -  Risk Factor Category  High Fall Risk - - High Fall Risk -  Risk for fall due to : History of fall(s) - - History of fall(s) History of fall(s)  Risk for fall due to (comments): - - - - patient states she believes it is due to her left knee instability and arthritis  Follow up Falls prevention discussed - - Falls prevention discussed -    Assessment:  (1) Reviewed [facility] program. Patient has [facility] new patient packet and consent forms that were provided in hospital. Patient denies any changes needed to consent form.  Provided [facility] calendar, my contact card and [facility] calendar. [employer] non discrimination handout provided. (2) recent discharged from hospital for chest pain. (3) no exercise at this time. (4) taking medications as prescribed.  Plan:  (1) consent in medical record.  Encouraged patient to call me if needed for any questions or concerns. (2) Patient active in  [facility] transition of care program. Will outreach patient weekly.   (3) Enocuraged patient to take advantage of silver sneakers program. Encouraged water exercises to assist with ease with joint pain. (4) encouraged patient to continue to take medications as prescribed.  Goal setting and care planning during home visit. Priority goal is to avoid readmission.  Next outreach planned for 12/28/2015 This note sent to MD.  [facility] CM Care Plan Problem One        Most Recent Value   Care Plan Problem One  Recent admission for chest pain.   Role Documenting the Problem One  Care Management Nance for  Problem One  Active   THN Long Term Goal (31-90 days)  Patient will not readmit to the hospital in the next 31 days.   THN Long Term Goal Start Date  12/08/15   Interventions for Problem One Long Term Goal  home visit completed. Enocurged patient to call the nurse line if needed. Reviewed importance of calling MD for symptoms.   [facility]N CM Short Term Goal #1 (0-30 days)  Patient will follow up with primary md within 7 days of discharge   [facility] CM Short Term Goal #1 Start Date  12/15/15   [facility] CM Short Term Goal #1 Met Date  12/20/15 Barrie Folk met]   Interventions for Short Term Goal #1  encouraged patient to discuss concerns with MD about chest pain. Reviewed importance to call MD with changes in symtoms    [facility]N CM Short Term Goal #2 (0-30 days)  Pateint will weigh and record weights daily for the next 30 days   [facility]N CM Short Term Goal #2 Start Date  12/15/15   Interventions for Short Term Goal #2  enocuraged patient to weigh daily. Provided low salt diet tear off poster.    [facility]N CM Short Term Goal #3 (0-30 days)  Patient will consider starting an exercise program in the next 2 weeks.   [facility]N CM Short Term Goal #3 Start Date  12/20/15   Interventions for Short Tern Goal #3  reviewed option for exercise. discussed benefits of exercise plan.      Tomasa Rand, RN, BSN, CEN [facility] [employer] [phone]

## 2015-12-21 ENCOUNTER — Telehealth: Payer: Self-pay | Admitting: *Deleted

## 2015-12-21 NOTE — Telephone Encounter (Signed)
Patient received an mail order RX for Carvedilol that was sent from the hospital.  The patient was told she does not have a problem with her heart and asked if she needs to start the RX.  Per Dr Oneta Rack, she should not take the medication.

## 2015-12-28 ENCOUNTER — Other Ambulatory Visit: Payer: Self-pay

## 2015-12-28 ENCOUNTER — Ambulatory Visit (INDEPENDENT_AMBULATORY_CARE_PROVIDER_SITE_OTHER): Payer: Commercial Managed Care - HMO | Admitting: Internal Medicine

## 2015-12-28 ENCOUNTER — Ambulatory Visit: Payer: Self-pay

## 2015-12-28 ENCOUNTER — Encounter: Payer: Self-pay | Admitting: Internal Medicine

## 2015-12-28 VITALS — BP 106/62 | HR 72 | Temp 98.0°F | Resp 16 | Ht 63.75 in | Wt 160.0 lb

## 2015-12-28 DIAGNOSIS — E039 Hypothyroidism, unspecified: Secondary | ICD-10-CM

## 2015-12-28 DIAGNOSIS — E559 Vitamin D deficiency, unspecified: Secondary | ICD-10-CM | POA: Diagnosis not present

## 2015-12-28 DIAGNOSIS — R7309 Other abnormal glucose: Secondary | ICD-10-CM | POA: Diagnosis not present

## 2015-12-28 DIAGNOSIS — I10 Essential (primary) hypertension: Secondary | ICD-10-CM | POA: Diagnosis not present

## 2015-12-28 DIAGNOSIS — R7303 Prediabetes: Secondary | ICD-10-CM

## 2015-12-28 DIAGNOSIS — Z79899 Other long term (current) drug therapy: Secondary | ICD-10-CM

## 2015-12-28 DIAGNOSIS — E785 Hyperlipidemia, unspecified: Secondary | ICD-10-CM | POA: Diagnosis not present

## 2015-12-28 LAB — CBC WITH DIFFERENTIAL/PLATELET
Basophils Absolute: 0 cells/uL (ref 0–200)
Basophils Relative: 0 %
Eosinophils Absolute: 72 cells/uL (ref 15–500)
Eosinophils Relative: 1 %
HCT: 40.7 % (ref 35.0–45.0)
Hemoglobin: 13.8 g/dL (ref 11.7–15.5)
Lymphocytes Relative: 20 %
Lymphs Abs: 1440 cells/uL (ref 850–3900)
MCH: 31.9 pg (ref 27.0–33.0)
MCHC: 33.9 g/dL (ref 32.0–36.0)
MCV: 94.2 fL (ref 80.0–100.0)
MPV: 10.1 fL (ref 7.5–12.5)
Monocytes Absolute: 504 cells/uL (ref 200–950)
Monocytes Relative: 7 %
Neutro Abs: 5184 cells/uL (ref 1500–7800)
Neutrophils Relative %: 72 %
Platelets: 181 10*3/uL (ref 140–400)
RBC: 4.32 MIL/uL (ref 3.80–5.10)
RDW: 13.6 % (ref 11.0–15.0)
WBC: 7.2 10*3/uL (ref 3.8–10.8)

## 2015-12-28 LAB — BASIC METABOLIC PANEL WITH GFR
BUN: 26 mg/dL — ABNORMAL HIGH (ref 7–25)
CO2: 26 mmol/L (ref 20–31)
Calcium: 9 mg/dL (ref 8.6–10.4)
Chloride: 101 mmol/L (ref 98–110)
Creat: 1.16 mg/dL — ABNORMAL HIGH (ref 0.60–0.93)
GFR, Est African American: 55 mL/min — ABNORMAL LOW (ref >=60)
GFR, Est Non African American: 47 mL/min — ABNORMAL LOW (ref >=60)
Glucose, Bld: 85 mg/dL (ref 65–99)
Potassium: 3.5 mmol/L (ref 3.5–5.3)
Sodium: 138 mmol/L (ref 135–146)

## 2015-12-28 LAB — TSH: TSH: 0.77 mIU/L

## 2015-12-28 LAB — HEMOGLOBIN A1C
Hgb A1c MFr Bld: 5.2 % (ref <5.7)
Mean Plasma Glucose: 103 mg/dL

## 2015-12-28 LAB — HEPATIC FUNCTION PANEL
ALT: 17 U/L (ref 6–29)
AST: 14 U/L (ref 10–35)
Albumin: 3.8 g/dL (ref 3.6–5.1)
Alkaline Phosphatase: 76 U/L (ref 33–130)
Bilirubin, Direct: 0.1 mg/dL (ref <=0.2)
Indirect Bilirubin: 0.4 mg/dL (ref 0.2–1.2)
Total Bilirubin: 0.5 mg/dL (ref 0.2–1.2)
Total Protein: 6.2 g/dL (ref 6.1–8.1)

## 2015-12-28 LAB — LIPID PANEL
Cholesterol: 153 mg/dL (ref 125–200)
HDL: 86 mg/dL (ref >=46)
LDL Cholesterol: 35 mg/dL (ref <130)
Total CHOL/HDL Ratio: 1.8 Ratio (ref <=5.0)
Triglycerides: 162 mg/dL — ABNORMAL HIGH (ref <150)
VLDL: 32 mg/dL — ABNORMAL HIGH (ref <30)

## 2015-12-28 MED ORDER — AMITRIPTYLINE HCL 25 MG PO TABS: 25.0000 mg | ORAL_TABLET | Freq: Every day | ORAL | Status: DC

## 2015-12-28 NOTE — Patient Instructions (Signed)
Amitriptyline tablets  What is this medicine?  AMITRIPTYLINE (a mee TRIP ti leen) is used to treat depression.  This medicine may be used for other purposes; ask your health care provider or pharmacist if you have questions.  What should I tell my health care provider before I take this medicine?  They need to know if you have any of these conditions:  -an alcohol problem  -asthma, difficulty breathing  -bipolar disorder or schizophrenia  -difficulty passing urine, prostate trouble  -glaucoma  -heart disease or previous heart attack  -liver disease  -over active thyroid  -seizures  -thoughts or plans of suicide, a previous suicide attempt, or family history of suicide attempt  -an unusual or allergic reaction to amitriptyline, other medicines, foods, dyes, or preservatives  -pregnant or trying to get pregnant  -breast-feeding  How should I use this medicine?  Take this medicine by mouth with a drink of water. Follow the directions on the prescription label. You can take the tablets with or without food. Take your medicine at regular intervals. Do not take it more often than directed. Do not stop taking this medicine suddenly except upon the advice of your doctor. Stopping this medicine too quickly may cause serious side effects or your condition may worsen.  A special MedGuide will be given to you by the pharmacist with each prescription and refill. Be sure to read this information carefully each time.  Talk to your pediatrician regarding the use of this medicine in children. Special care may be needed.  Overdosage: If you think you have taken too much of this medicine contact a poison control center or emergency room at once.  NOTE: This medicine is only for you. Do not share this medicine with others.  What if I miss a dose?  If you miss a dose, take it as soon as you can. If it is almost time for your next dose, take only that dose. Do not take double or extra doses.  What may interact with this medicine?  Do not  take this medicine with any of the following medications:  -arsenic trioxide  -certain medicines used to regulate abnormal heartbeat or to treat other heart conditions  -cisapride  -droperidol  -halofantrine  -linezolid  -MAOIs like Carbex, Eldepryl, Marplan, Nardil, and Parnate  -methylene blue  -other medicines for mental depression  -phenothiazines like perphenazine, thioridazine and chlorpromazine  -pimozide  -probucol  -procarbazine  -sparfloxacin  -St. John's Wort  -ziprasidone  This medicine may also interact with the following medications:  -atropine and related drugs like hyoscyamine, scopolamine, tolterodine and others  -barbiturate medicines for inducing sleep or treating seizures, like phenobarbital  -cimetidine  -disulfiram  -ethchlorvynol  -thyroid hormones such as levothyroxine  This list may not describe all possible interactions. Give your health care provider a list of all the medicines, herbs, non-prescription drugs, or dietary supplements you use. Also tell them if you smoke, drink alcohol, or use illegal drugs. Some items may interact with your medicine.  What should I watch for while using this medicine?  Tell your doctor if your symptoms do not get better or if they get worse. Visit your doctor or health care professional for regular checks on your progress. Because it may take several weeks to see the full effects of this medicine, it is important to continue your treatment as prescribed by your doctor.  Patients and their families should watch out for new or worsening thoughts of suicide or depression. Also watch out   for sudden changes in feelings such as feeling anxious, agitated, panicky, irritable, hostile, aggressive, impulsive, severely restless, overly excited and hyperactive, or not being able to sleep. If this happens, especially at the beginning of treatment or after a change in dose, call your health care professional.  You may get drowsy or dizzy. Do not drive, use machinery, or  do anything that needs mental alertness until you know how this medicine affects you. Do not stand or sit up quickly, especially if you are an older patient. This reduces the risk of dizzy or fainting spells. Alcohol may interfere with the effect of this medicine. Avoid alcoholic drinks.  Do not treat yourself for coughs, colds, or allergies without asking your doctor or health care professional for advice. Some ingredients can increase possible side effects.  Your mouth may get dry. Chewing sugarless gum or sucking hard candy, and drinking plenty of water will help. Contact your doctor if the problem does not go away or is severe.  This medicine may cause dry eyes and blurred vision. If you wear contact lenses you may feel some discomfort. Lubricating drops may help. See your eye doctor if the problem does not go away or is severe.  This medicine can cause constipation. Try to have a bowel movement at least every 2 to 3 days. If you do not have a bowel movement for 3 days, call your doctor or health care professional.  This medicine can make you more sensitive to the sun. Keep out of the sun. If you cannot avoid being in the sun, wear protective clothing and use sunscreen. Do not use sun lamps or tanning beds/booths.  What side effects may I notice from receiving this medicine?  Side effects that you should report to your doctor or health care professional as soon as possible:  -allergic reactions like skin rash, itching or hives, swelling of the face, lips, or tongue  -abnormal production of milk in females  -breast enlargement in both males and females  -breathing problems  -confusion, hallucinations  -fast, irregular heartbeat  -fever with increased sweating  -muscle stiffness, or spasms  -pain or difficulty passing urine, loss of bladder control  -seizures  -suicidal thoughts or other mood changes  -swelling of the testicles  -tingling, pain, or numbness in the feet or hands  -yellowing of the eyes or  skin  Side effects that usually do not require medical attention (report to your doctor or health care professional if they continue or are bothersome):  -change in sex drive or performance  -constipation or diarrhea  -nausea, vomiting  -weight gain or loss  This list may not describe all possible side effects. Call your doctor for medical advice about side effects. You may report side effects to FDA at 1-800-FDA-1088.  Where should I keep my medicine?  Keep out of the reach of children.  Store at room temperature between 20 and 25 degrees C (68 and 77 degrees F). Throw away any unused medicine after the expiration date.  NOTE: This sheet is a summary. It may not cover all possible information. If you have questions about this medicine, talk to your doctor, pharmacist, or health care provider.     © 2016, [employer] Standard. (2011-12-09 13:50:32)

## 2015-12-28 NOTE — Patient Outreach (Signed)
Transition of care call: Placed call to patient for weekly transition of care.  No answer. Left a message requesting a call back.   Noted that patient had a follow up in primary care Md office this morning.  PLAN: will continue weekly transition of care calls.  Rowe Pavy, RN, BSN, CEN [facility] [employer] [phone]

## 2015-12-28 NOTE — Progress Notes (Signed)
Assessment and Plan:  Hypertension:  -Continue medication,  -monitor blood pressure at home.  -Continue DASH diet.   -Reminder to go to the ER if any CP, SOB, nausea, dizziness, severe HA, changes vision/speech, left arm numbness and tingling, and jaw pain.  Cholesterol: -Continue diet and exercise.  -Check cholesterol.   Pre-diabetes: -Continue diet and exercise.  -Check A1C  Vitamin D Def: -check level -continue medications.   Arthritis -recommended tylenol extra strength TID -can use possible NSAID therapy if worsens  Continue diet and meds as discussed. Further disposition pending results of labs.  HPI 70 y.o. female  presents for 3 month follow up with hypertension, hyperlipidemia, prediabetes and vitamin D.   Her blood pressure has been controlled at home, today their BP is BP: 106/62 mmHg.   She does not workout. She denies chest pain, shortness of breath, dizziness.   She is on cholesterol medication and denies myalgias. Her cholesterol is at goal. The cholesterol last visit was:   Lab Results  Component Value Date   CHOL 136 09/26/2015   HDL 58 09/26/2015   LDLCALC 56 09/26/2015   TRIG 108 09/26/2015   CHOLHDL 2.3 09/26/2015     She has been working on diet and exercise for prediabetes, and denies foot ulcerations, hyperglycemia, hypoglycemia , increased appetite, nausea, paresthesia of the feet, polydipsia, polyuria, visual disturbances, vomiting and weight loss. Last A1C in the office was:  Lab Results  Component Value Date   HGBA1C 5.1 09/26/2015    Patient is on Vitamin D supplement.  Lab Results  Component Value Date   VD25OH 51 09/26/2015     She notes that she is having some issues with her arthritis.  She notes that she has some good days and bad days.    Current Medications:  Current Outpatient Prescriptions on File Prior to Visit  Medication Sig Dispense Refill  . acetaminophen (TYLENOL) 500 MG tablet Take 500 mg by mouth 3 (three) times  daily.    Marland Kitchen aspirin 325 MG tablet Take 325 mg by mouth daily.    . Biotin 1 MG CAPS Take 1 mg by mouth daily.     . Calcium Carb-Cholecalciferol (CALCIUM-VITAMIN D) 600-400 MG-UNIT TABS Take 1 tablet by mouth daily.    . Cholecalciferol (VITAMIN D3) 2000 UNITS capsule Take 6,000 Units by mouth daily.    Marland Kitchen docusate sodium (COLACE) 100 MG capsule Take 1 capsule (100 mg total) by mouth 2 (two) times daily. (Patient taking differently: Take 100 mg by mouth daily as needed for mild constipation or moderate constipation. ) 10 capsule 0  . folic acid (FOLVITE) 400 MCG tablet Take 400 mcg by mouth daily.    Marland Kitchen levothyroxine (SYNTHROID, LEVOTHROID) 112 MCG tablet Take 1 tablet (112 mcg total) by mouth daily. 90 tablet 3  . lisinopril (PRINIVIL,ZESTRIL) 20 MG tablet TAKE 1 TABLET EVERY DAY FOR BLOOD PRESSURE 90 tablet 1  . MAGNESIUM OXIDE PO Take 400 mg by mouth daily. Reported on 12/20/2015    . methocarbamol (ROBAXIN) 500 MG tablet TAKE 1 TABLET THREE TIMES DAILY IF NEEDED FOR MUSCLE SPASMS 90 tablet 1  . omeprazole (PRILOSEC) 40 MG capsule TAKE 1 CAPSULE EVERY DAY 90 capsule 1  . predniSONE (DELTASONE) 20 MG tablet 1 tab 3 x day for 3 days, then 1 tab 2 x day for 3 days, then 1 tab 1 x day for 5 days 20 tablet 0  . simvastatin (ZOCOR) 40 MG tablet Take 1 tablet (40 mg total)  by mouth every evening. 90 tablet 3  . traZODone (DESYREL) 150 MG tablet Take 1 tablet (150 mg total) by mouth at bedtime. 90 tablet 1  . triamcinolone cream (KENALOG) 0.1 % APPLY 1 APPLICATION TOPICALLY  TWO  TIMES DAILY. (Patient taking differently: APPLY 1 APPLICATION TOPICALLY  AT BEDTIME) 80 g 3   No current facility-administered medications on file prior to visit.    Medical History:  Past Medical History  Diagnosis Date  . Hyperlipidemia   . GERD (gastroesophageal reflux disease)   . Arthritis   . Depression   . Vitamin D deficiency   . Prediabetes   . Occipital neuralgia   . Essential hypertension     Normal  cardiolite 05/2006 EF 71%  . Mini stroke (HCC) 2011   . Hypothyroidism   . Urinary tract infection   . Headache   . Allergy     Allergies: No Known Allergies   Review of Systems:  Review of Systems  Constitutional: Negative for fever, chills and malaise/fatigue.  HENT: Negative for congestion, ear pain and sore throat.   Eyes: Negative.   Respiratory: Positive for cough. Negative for shortness of breath and wheezing.   Cardiovascular: Negative for chest pain, palpitations and leg swelling.  Musculoskeletal: Positive for joint pain.  Skin: Negative.   Neurological: Negative for dizziness, sensory change, loss of consciousness and headaches.  Psychiatric/Behavioral: Negative for depression. The patient has insomnia. The patient is not nervous/anxious.     Family history- Review and unchanged  Social history- Review and unchanged  Physical Exam: BP 106/62 mmHg  Pulse 72  Temp(Src) 98 F (36.7 C) (Temporal)  Resp 16  Ht 5' 3.75" (1.619 m)  Wt 160 lb (72.576 kg)  BMI 27.69 kg/m2 Wt Readings from Last 3 Encounters:  12/28/15 160 lb (72.576 kg)  12/20/15 150 lb (68.04 kg)  12/18/15 161 lb 3.2 oz (73.12 kg)    General Appearance: Well nourished well developed, in no apparent distress. Eyes: PERRLA, EOMs, conjunctiva no swelling or erythema ENT/Mouth: Ear canals normal without obstruction, swelling, erythma, discharge.  TMs normal bilaterally.  Oropharynx moist, clear, without exudate, or postoropharyngeal swelling. Neck: Supple, thyroid normal,no cervical adenopathy  Respiratory: Respiratory effort normal, Breath sounds clear A&P without rhonchi, wheeze, or rale.  No retractions, no accessory usage. Cardio: RRR with no MRGs. Brisk peripheral pulses without edema.  Abdomen: Soft, + BS,  Non tender, no guarding, rebound, hernias, masses. Musculoskeletal: Full ROM, 5/5 strength, Normal gait Skin: Warm, dry without rashes, lesions, ecchymosis.  Neuro: Awake and oriented X 3,  Cranial nerves intact. Normal muscle tone, no cerebellar symptoms. Psych: Normal affect, Insight and Judgment appropriate.    Terri Piedra, PA-C 10:07 AM [facility]

## 2016-01-03 ENCOUNTER — Telehealth: Payer: Self-pay | Admitting: *Deleted

## 2016-01-03 NOTE — Telephone Encounter (Signed)
Patient called stating Amitriptyline 25 mg given 12/28/15 was helpful in helping to decrease symptoms of her headaches, but not helpful with her sleep disturbances.  She is able to fall asleep but having a hard time staying asleep.  Per Terri Piedra, PA-C orders, patient was advised to increase to 50 mg qhs and let us know if that helps her stay asleep.  Patient expressed understanding and will call with results of increased dose.

## 2016-01-04 ENCOUNTER — Other Ambulatory Visit: Payer: Self-pay

## 2016-01-04 NOTE — Patient Outreach (Signed)
Transition of care call: Placed call to patient who reports that she is doing well. States that she was prescribed a sleeping pill and took 5 for the first time last night. Reports that she sleep well and does not feel sleepy this morning.  Reports that she has been doing well with no new complaints.   PLAN: Will continue weekly transition of care calls. Encouraged patient to call me if needed.  Rowe Pavy, RN, BSN, CEN [facility] [employer] [phone]

## 2016-01-11 ENCOUNTER — Other Ambulatory Visit: Payer: Self-pay

## 2016-01-11 NOTE — Patient Outreach (Signed)
Transition of care call: Placed call to patient who reports that she is doing well. States that she has been walking for exercise. Reports blood pressure is doing "good".  States no new problems or concerns. Reports chest wall pain when active. Pain scale 3/10. Reports that she is sleeping better at night. No readmission or ED visits.  PLAN: Will call patient for 1 more transition of care. Discussed with patient if no needs will discharged after last call.  Rowe Pavy, RN, BSN, CEN [facility] [employer] [phone]

## 2016-01-12 ENCOUNTER — Other Ambulatory Visit: Payer: Self-pay | Admitting: *Deleted

## 2016-01-12 MED ORDER — AMITRIPTYLINE HCL 25 MG PO TABS: 50.0000 mg | ORAL_TABLET | Freq: Every day | ORAL | Status: DC

## 2016-01-15 ENCOUNTER — Other Ambulatory Visit: Payer: Self-pay | Admitting: *Deleted

## 2016-01-15 MED ORDER — AMITRIPTYLINE HCL 25 MG PO TABS: 50.0000 mg | ORAL_TABLET | Freq: Every day | ORAL | Status: DC

## 2016-01-16 ENCOUNTER — Other Ambulatory Visit: Payer: Self-pay

## 2016-01-16 NOTE — Patient Outreach (Signed)
Transition of care case closure: Placed call to patient who reports that she is doing well. States that she continues to have arthritis type chest pain which she takes tylenol for. Reports that she is otherwise feeling great.  Denies needing any further assistance from [facility].  Congratulated patient on completing transition of care program without a readmission.   PLAN: Will close case and notify MD and patient via letter. Goals met. Encouraged patient to call for future needs.  [facility] CM Care Plan Problem One        Most Recent Value   Care Plan Problem One  Recent admission for chest pain.   Role Documenting the Problem One  Care Management [place] for Problem One  Active   [facility] Long Term Goal (31-90 days)  Patient will not readmit to the hospital in the next 31 days.   [facility] Long Term Goal Start Date  12/08/15   [facility] Long Term Goal Met Date  01/16/16 Barrie Folk met]   Interventions for Problem One Long Term Goal  Encouraged patient to call MD for problems or concerns.   [facility] CM Short Term Goal #1 (0-30 days)  Patient will follow up with primary md within 7 days of discharge   [facility] CM Short Term Goal #1 Start Date  12/15/15   [facility] CM Short Term Goal #1 Met Date  12/20/15 Barrie Folk met]   Interventions for Short Term Goal #1  encouraged patient to discuss concerns with MD about chest pain. Reviewed importance to call MD with changes in symtoms    [facility] CM Short Term Goal #2 (0-30 days)  Pateint will weigh and record weights daily for the next 30 days   [facility] CM Short Term Goal #2 Start Date  12/15/15   [facility] CM Short Term Goal #2 Met Date  01/16/16 Barrie Folk met]   Interventions for Short Term Goal #2  reviewed importance of daily weights   [facility]N CM Short Term Goal #3 (0-30 days)  Patient will consider starting an exercise program in the next 2 weeks.   [facility] CM Short Term Goal #3 Start Date  12/20/15   [facility] CM Short Term Goal #3 Met Date  01/11/16 Barrie Folk met]   Interventions for Short Tern Goal #3  reviewed  option for exercise. discussed benefits of exercise plan.      Tomasa Rand, RN, BSN, CEN [facility] [employer] [phone]

## 2016-02-02 ENCOUNTER — Other Ambulatory Visit: Payer: Self-pay | Admitting: Internal Medicine

## 2016-02-02 DIAGNOSIS — H109 Unspecified conjunctivitis: Secondary | ICD-10-CM

## 2016-02-02 MED ORDER — NEOMYCIN-POLYMYXIN-DEXAMETH 3.5-10000-0.1 OP SUSP: OPHTHALMIC | Status: DC

## 2016-02-05 ENCOUNTER — Other Ambulatory Visit: Payer: Self-pay | Admitting: Internal Medicine

## 2016-02-20 ENCOUNTER — Other Ambulatory Visit: Payer: Self-pay | Admitting: Physician Assistant

## 2016-02-23 ENCOUNTER — Other Ambulatory Visit: Payer: Self-pay | Admitting: Internal Medicine

## 2016-04-01 ENCOUNTER — Other Ambulatory Visit: Payer: Self-pay | Admitting: Internal Medicine

## 2016-04-02 ENCOUNTER — Ambulatory Visit (INDEPENDENT_AMBULATORY_CARE_PROVIDER_SITE_OTHER): Payer: Commercial Managed Care - HMO | Admitting: Internal Medicine

## 2016-04-02 ENCOUNTER — Encounter: Payer: Self-pay | Admitting: Internal Medicine

## 2016-04-02 VITALS — BP 110/66 | HR 92 | Temp 97.8°F | Resp 16 | Ht 63.75 in | Wt 167.0 lb

## 2016-04-02 DIAGNOSIS — R6889 Other general symptoms and signs: Secondary | ICD-10-CM

## 2016-04-02 DIAGNOSIS — E039 Hypothyroidism, unspecified: Secondary | ICD-10-CM | POA: Diagnosis not present

## 2016-04-02 DIAGNOSIS — Z0001 Encounter for general adult medical examination with abnormal findings: Secondary | ICD-10-CM | POA: Diagnosis not present

## 2016-04-02 DIAGNOSIS — I10 Essential (primary) hypertension: Secondary | ICD-10-CM | POA: Diagnosis not present

## 2016-04-02 DIAGNOSIS — E559 Vitamin D deficiency, unspecified: Secondary | ICD-10-CM

## 2016-04-02 DIAGNOSIS — E662 Morbid (severe) obesity with alveolar hypoventilation: Secondary | ICD-10-CM

## 2016-04-02 DIAGNOSIS — Z96659 Presence of unspecified artificial knee joint: Secondary | ICD-10-CM | POA: Diagnosis not present

## 2016-04-02 DIAGNOSIS — R7303 Prediabetes: Secondary | ICD-10-CM | POA: Diagnosis not present

## 2016-04-02 DIAGNOSIS — Z79899 Other long term (current) drug therapy: Secondary | ICD-10-CM | POA: Diagnosis not present

## 2016-04-02 DIAGNOSIS — Z6827 Body mass index (BMI) 27.0-27.9, adult: Secondary | ICD-10-CM

## 2016-04-02 DIAGNOSIS — Z23 Encounter for immunization: Secondary | ICD-10-CM | POA: Diagnosis not present

## 2016-04-02 DIAGNOSIS — E785 Hyperlipidemia, unspecified: Secondary | ICD-10-CM

## 2016-04-02 DIAGNOSIS — Z0000 Encounter for general adult medical examination without abnormal findings: Secondary | ICD-10-CM

## 2016-04-02 DIAGNOSIS — F329 Major depressive disorder, single episode, unspecified: Secondary | ICD-10-CM

## 2016-04-02 DIAGNOSIS — K219 Gastro-esophageal reflux disease without esophagitis: Secondary | ICD-10-CM

## 2016-04-02 DIAGNOSIS — F32A Depression, unspecified: Secondary | ICD-10-CM

## 2016-04-02 LAB — CBC WITH DIFFERENTIAL/PLATELET
Basophils Absolute: 39 cells/uL (ref 0–200)
Basophils Relative: 1 %
Eosinophils Absolute: 78 cells/uL (ref 15–500)
Eosinophils Relative: 2 %
HCT: 40.5 % (ref 35.0–45.0)
Hemoglobin: 13.9 g/dL (ref 11.7–15.5)
Lymphocytes Relative: 24 %
Lymphs Abs: 936 cells/uL (ref 850–3900)
MCH: 32.1 pg (ref 27.0–33.0)
MCHC: 34.3 g/dL (ref 32.0–36.0)
MCV: 93.5 fL (ref 80.0–100.0)
MPV: 11.1 fL (ref 7.5–12.5)
Monocytes Absolute: 312 cells/uL (ref 200–950)
Monocytes Relative: 8 %
Neutro Abs: 2535 cells/uL (ref 1500–7800)
Neutrophils Relative %: 65 %
Platelets: 183 10*3/uL (ref 140–400)
RBC: 4.33 MIL/uL (ref 3.80–5.10)
RDW: 12.5 % (ref 11.0–15.0)
WBC: 3.9 10*3/uL (ref 3.8–10.8)

## 2016-04-02 LAB — BASIC METABOLIC PANEL WITH GFR
BUN: 21 mg/dL (ref 7–25)
CO2: 25 mmol/L (ref 20–31)
Calcium: 9.7 mg/dL (ref 8.6–10.4)
Chloride: 103 mmol/L (ref 98–110)
Creat: 0.98 mg/dL — ABNORMAL HIGH (ref 0.60–0.93)
GFR, Est African American: 67 mL/min (ref >=60)
GFR, Est Non African American: 58 mL/min — ABNORMAL LOW (ref >=60)
Glucose, Bld: 92 mg/dL (ref 65–99)
Potassium: 4.4 mmol/L (ref 3.5–5.3)
Sodium: 139 mmol/L (ref 135–146)

## 2016-04-02 LAB — HEPATIC FUNCTION PANEL
ALT: 16 U/L (ref 6–29)
AST: 16 U/L (ref 10–35)
Albumin: 4.4 g/dL (ref 3.6–5.1)
Alkaline Phosphatase: 88 U/L (ref 33–130)
Bilirubin, Direct: 0.1 mg/dL (ref <=0.2)
Indirect Bilirubin: 0.4 mg/dL (ref 0.2–1.2)
Total Bilirubin: 0.5 mg/dL (ref 0.2–1.2)
Total Protein: 7 g/dL (ref 6.1–8.1)

## 2016-04-02 LAB — LIPID PANEL
Cholesterol: 166 mg/dL (ref 125–200)
HDL: 70 mg/dL (ref >=46)
LDL Cholesterol: 71 mg/dL (ref <130)
Total CHOL/HDL Ratio: 2.4 Ratio (ref <=5.0)
Triglycerides: 126 mg/dL (ref <150)
VLDL: 25 mg/dL (ref <30)

## 2016-04-02 LAB — HEMOGLOBIN A1C
Hgb A1c MFr Bld: 4.8 % (ref <5.7)
Mean Plasma Glucose: 91 mg/dL

## 2016-04-02 LAB — TSH: TSH: 2.42 mIU/L

## 2016-04-02 NOTE — Progress Notes (Signed)
MEDICARE ANNUAL WELLNESS VISIT AND FOLLOW UP  Assessment:   1. Need for prophylactic vaccination and inoculation against influenza  - Flu vaccine HIGH DOSE PF (Fluzone High dose)  2. Essential hypertension -well controlled -dash diet -cont meds -monitor at home - TSH  3. Gastroesophageal reflux disease, esophagitis presence not specified -cont meds -avoid trigger foods -conservative measures like elevating HOB and small frequent meals discussed -well controlled per patient report  4. Hypothyroidism, unspecified hypothyroidism type -cont levothyroxine -TSH  5. Hyperlipidemia -cont meds -diet and exercise - Lipid panel  6. Vitamin D deficiency -cont Vit D supplement  7. Depression -cont meds  8. Prediabetes -cont diet and exercise - Hemoglobin A1c  9. Morbid obesity with alveolar hypoventilation (HCC) -cont diet and exercise  10. Medication management  - CBC with Differential/Platelet - BASIC METABOLIC PANEL WITH GFR - Hepatic function panel  11. Status post total knee replacement, unspecified laterality -recovering well -followed by ortho  12. BMI 27.0-27.9,adult -diet and exercise   Over 30 minutes of exam, counseling, chart review, and critical decision making was performed  Future Appointments Date Time Provider Department Center  07/10/2016 10:30 AM Terri Piedra, PA-C [facility] None  10/08/2016 10:00 AM Terri Piedra, PA-C [facility] None    Plan:   During the course of the visit the patient was educated and counseled about appropriate screening and preventive services including:    Pneumococcal vaccine   Influenza vaccine  Td vaccine  Prevnar 13  Screening electrocardiogram  Screening mammography  Bone densitometry screening  Colorectal cancer screening  Diabetes screening  Glaucoma screening  Nutrition counseling   Advanced directives: given info/requested copies   Subjective:   Kimberly Cook is a 70 y.o.  female who presents for Medicare Annual Wellness Visit and 3 month follow up on hypertension, prediabetes, hyperlipidemia, vitamin D def.   Her blood pressure has been controlled at home, today their BP is BP: 110/66 She does not workout. She denies chest pain, shortness of breath, dizziness.  She is on cholesterol medication and denies myalgias. Her cholesterol is at goal. The cholesterol last visit was:   Lab Results  Component Value Date   CHOL 153 12/28/2015   HDL 86 12/28/2015   LDLCALC 35 12/28/2015   TRIG 162 (H) 12/28/2015   CHOLHDL 1.8 12/28/2015   She reports that she is controlling her prediabetes with diet and exercise.    Lab Results  Component Value Date   HGBA1C 5.2 12/28/2015   Last GFR Lab Results  Component Value Date   GFRNONAA 47 (L) 12/28/2015   Lab Results  Component Value Date   GFRAA 55 (L) 12/28/2015   Patient is on Vitamin D supplement. Lab Results  Component Value Date   VD25OH 51 09/26/2015     She reports that she is having some post nasal drainage.  She reports that she feels like it is worse with laying down.    She has been doing well with reflux.  She reports that she is not having reflux.    She is having trouble with sleeping.  She reports that she will have one day of good sleep and then 2 days of poor sleep.  She is not exercising signficantly.  She feels like she can't turn her brain off.   Medication Review Current Outpatient Prescriptions on File Prior to Visit  Medication Sig Dispense Refill  . acetaminophen (TYLENOL) 500 MG tablet Take 500 mg by mouth 3 (three) times daily.    Marland Kitchen  amitriptyline (ELAVIL) 25 MG tablet Take 2 tablets (50 mg total) by mouth at bedtime. 180 tablet 0  . aspirin 325 MG tablet Take 325 mg by mouth daily.    . Biotin 1 MG CAPS Take 1 mg by mouth daily.     . Calcium Carb-Cholecalciferol (CALCIUM-VITAMIN D) 600-400 MG-UNIT TABS Take 1 tablet by mouth daily.    . Cholecalciferol (VITAMIN D3) 2000 UNITS  capsule Take 6,000 Units by mouth daily.    Marland Kitchen docusate sodium (COLACE) 100 MG capsule Take 1 capsule (100 mg total) by mouth 2 (two) times daily. (Patient taking differently: Take 100 mg by mouth daily as needed for mild constipation or moderate constipation. ) 10 capsule 0  . folic acid (FOLVITE) 400 MCG tablet Take 400 mcg by mouth daily.    Marland Kitchen levothyroxine (SYNTHROID, LEVOTHROID) 112 MCG tablet Take 1 tablet (112 mcg total) by mouth daily. 90 tablet 3  . lisinopril (PRINIVIL,ZESTRIL) 20 MG tablet TAKE 1 TABLET EVERY DAY FOR BLOOD PRESSURE 90 tablet 1  . MAGNESIUM OXIDE PO Take 400 mg by mouth daily. Reported on 12/20/2015    . methocarbamol (ROBAXIN) 500 MG tablet TAKE 1 TABLET THREE TIMES DAILY IF NEEDED FOR MUSCLE SPASMS 90 tablet 1  . omeprazole (PRILOSEC) 40 MG capsule TAKE 1 CAPSULE EVERY DAY 90 capsule 1  . simvastatin (ZOCOR) 40 MG tablet TAKE 1 TABLET EVERY EVENING 90 tablet 0  . traZODone (DESYREL) 150 MG tablet Take 1 tablet (150 mg total) by mouth at bedtime. 90 tablet 1   No current facility-administered medications on file prior to visit.     Allergies: No Known Allergies  Current Problems (verified) has Hyperlipidemia; GERD (gastroesophageal reflux disease); Vitamin D deficiency; Depression; Prediabetes; Hypothyroidism; Morbid obesity (HCC); Medication management; S/P TKR (total knee replacement); BMI 27.0-27.9,adult; and Essential hypertension on her problem list.  Screening Tests Immunization History  Administered Date(s) Administered  . DT 09/26/2015  . Influenza, High Dose Seasonal PF 06/16/2015, 04/02/2016  . Influenza-Unspecified 04/18/2013, 04/05/2014  . PPD Test 04/19/2015  . Pneumococcal Conjugate-13 06/16/2015  . Pneumococcal-Unspecified 08/18/2010  . Td 08/18/2005  . Zoster 08/18/2005    Preventative care: Last colonoscopy: 2012 Last mammogram: 04/2015  DEXA:04/2015  Prior vaccinations: TD or Tdap: 2007  Influenza: 04/02/16  Pneumococcal:  2012 Prevnar13: 2016 Shingles/Zostavax: 2007  Names of Other Physician/Practitioners you currently use: 1. [facility]- here for primary care 2. [facility], eye doctor, last visit 2015, will schedule visit 3. Dentures, does not see them currently, dentist, last visit  Patient Care Team: Lucky Cowboy, MD as PCP - General (Internal Medicine) Charlott Rakes, MD as Consulting Physician (Gastroenterology) Salvatore Marvel, MD as Consulting Physician (Orthopedic Surgery)  Surgical: She  has a past surgical history that includes Breast biopsy (Right, Over 20 years ); Cholecystectomy; Bladder surgery; Tonsillectomy and adenoidectomy; Knee arthroscopy (Left, 2011); Abdominal hysterectomy; gastroplication ; Total knee arthroplasty (Left, 04/17/2015); and Joint replacement (Left). Family Her family history includes Breast cancer in her maternal aunt; Diabetes in her mother; Heart disease in her father and mother; Hypertension in her mother. Social history  She reports that she has never smoked. She has never used smokeless tobacco. She reports that she does not drink alcohol or use drugs.  MEDICARE WELLNESS OBJECTIVES: Physical activity: Current Exercise Habits: Home exercise routine, Time (Minutes): 30, Frequency (Times/Week): 5, Weekly Exercise (Minutes/Week): 150, Intensity: Moderate Cardiac risk factors: Cardiac Risk Factors include: dyslipidemia;hypertension;family history of premature cardiovascular disease Depression/mood screen:  Depression screen PHQ 2/9 04/02/2016  Decreased Interest 0  Down, Depressed, Hopeless 0  PHQ - 2 Score 0  Altered sleeping -  Tired, decreased energy -  Change in appetite -  Feeling bad or failure about yourself  -  Trouble concentrating -  Moving slowly or fidgety/restless -  Suicidal thoughts -  PHQ-9 Score -  Difficult doing work/chores -    ADLs:  In your present state of health, do you have any  difficulty performing the following activities: 04/02/2016 12/20/2015  Hearing? N N  Vision? Y Y  Difficulty concentrating or making decisions? N N  Walking or climbing stairs? N N  Dressing or bathing? N N  Doing errands, shopping? N N  Preparing Food and eating ? N N  Using the Toilet? N N  In the past six months, have you accidently leaked urine? N N  Do you have problems with loss of bowel control? N Y  Managing your Medications? N N  Managing your Finances? N N  Housekeeping or managing your Housekeeping? N N  Some recent data might be hidden     Cognitive Testing  Alert? Yes  Normal Appearance?Yes  Oriented to person? Yes  Place? Yes   Time? Yes  Recall of three objects?  Yes  Can perform simple calculations? Yes  Displays appropriate judgment?Yes  Can read the correct time from a watch face?Yes  EOL planning: Does patient have an advance directive?: Yes Type of Advance Directive: Healthcare Power of Attorney, Living will Does patient want to make changes to advanced directive?: No - Patient declined Copy of advanced directive(s) in chart?: Yes   Objective:   Today's Vitals   04/02/16 0930  BP: 110/66  Pulse: 92  Resp: 16  Temp: 97.8 F (36.6 C)  TempSrc: Temporal  Weight: 167 lb (75.8 kg)  Height: 5' 3.75" (1.619 m)   Body mass index is 28.89 kg/m.  General appearance: alert, no distress, WD/WN,  female HEENT: normocephalic, sclerae anicteric, TMs pearly, nares patent, no discharge or erythema, pharynx normal Oral cavity: MMM, no lesions Neck: supple, no lymphadenopathy, no thyromegaly, no masses Heart: RRR, normal S1, S2, no murmurs Lungs: CTA bilaterally, no wheezes, rhonchi, or rales Abdomen: +bs, soft, non tender, non distended, no masses, no hepatomegaly, no splenomegaly Musculoskeletal: nontender, no swelling, no obvious deformity Extremities: no edema, no cyanosis, no clubbing Pulses: 2+ symmetric, upper and lower extremities, normal cap  refill Neurological: alert, oriented x 3, CN2-12 intact, strength normal upper extremities and lower extremities, sensation normal throughout, DTRs 2+ throughout, no cerebellar signs, gait normal Psychiatric: normal affect, behavior normal, pleasant  Breast: defer Gyn: defer Rectal: defer   Medicare Attestation I have personally reviewed: The patient's medical and social history Their use of alcohol, tobacco or illicit drugs Their current medications and supplements The patient's functional ability including ADLs,fall risks, home safety risks, cognitive, and hearing and visual impairment Diet and physical activities Evidence for depression or mood disorders  The patient's weight, height, BMI, and visual acuity have been recorded in the chart.  I have made referrals, counseling, and provided education to the patient based on review of the above and I have provided the patient with a written personalized care plan for preventive services.     Terri Piedraourtney Forcucci, PA-C   04/02/2016

## 2016-04-03 ENCOUNTER — Other Ambulatory Visit: Payer: Self-pay | Admitting: Internal Medicine

## 2016-04-15 ENCOUNTER — Other Ambulatory Visit: Payer: Self-pay | Admitting: Internal Medicine

## 2016-04-29 ENCOUNTER — Other Ambulatory Visit: Payer: Self-pay | Admitting: Internal Medicine

## 2016-05-01 DIAGNOSIS — Z96652 Presence of left artificial knee joint: Secondary | ICD-10-CM | POA: Diagnosis not present

## 2016-05-01 DIAGNOSIS — Z471 Aftercare following joint replacement surgery: Secondary | ICD-10-CM | POA: Diagnosis not present

## 2016-05-03 ENCOUNTER — Other Ambulatory Visit: Payer: Self-pay | Admitting: Internal Medicine

## 2016-05-03 DIAGNOSIS — E039 Hypothyroidism, unspecified: Secondary | ICD-10-CM

## 2016-05-06 ENCOUNTER — Other Ambulatory Visit: Payer: Self-pay | Admitting: Internal Medicine

## 2016-05-24 ENCOUNTER — Other Ambulatory Visit: Payer: Self-pay | Admitting: Internal Medicine

## 2016-06-10 IMAGING — DX DG CHEST 2V
2 series · 2 of 2 positions shown · non-contrast
Comparison: August 23, 2015.

CLINICAL DATA: Chest pain, shortness of breath.

EXAM:
CHEST  2 VIEW

[chest pa]
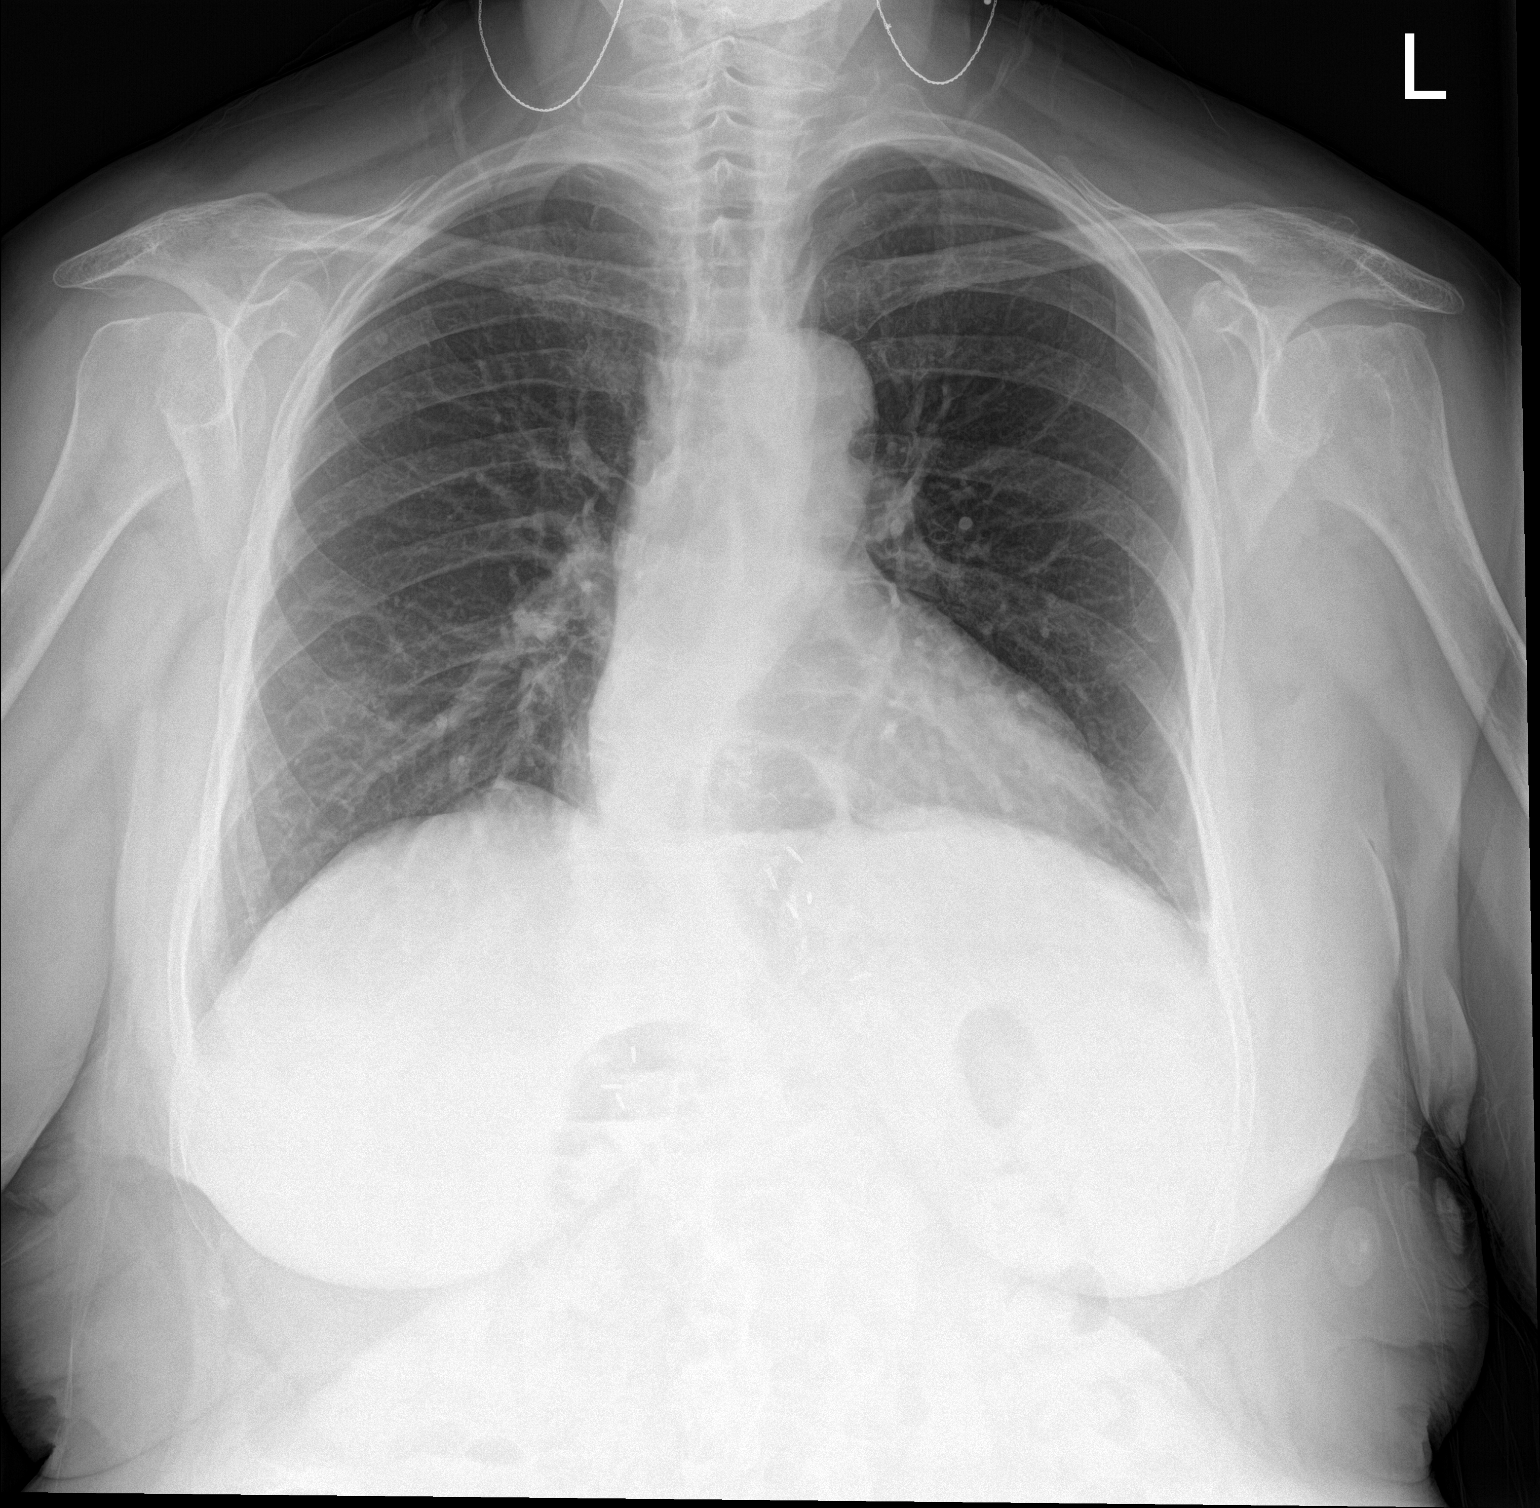

[chest lat]
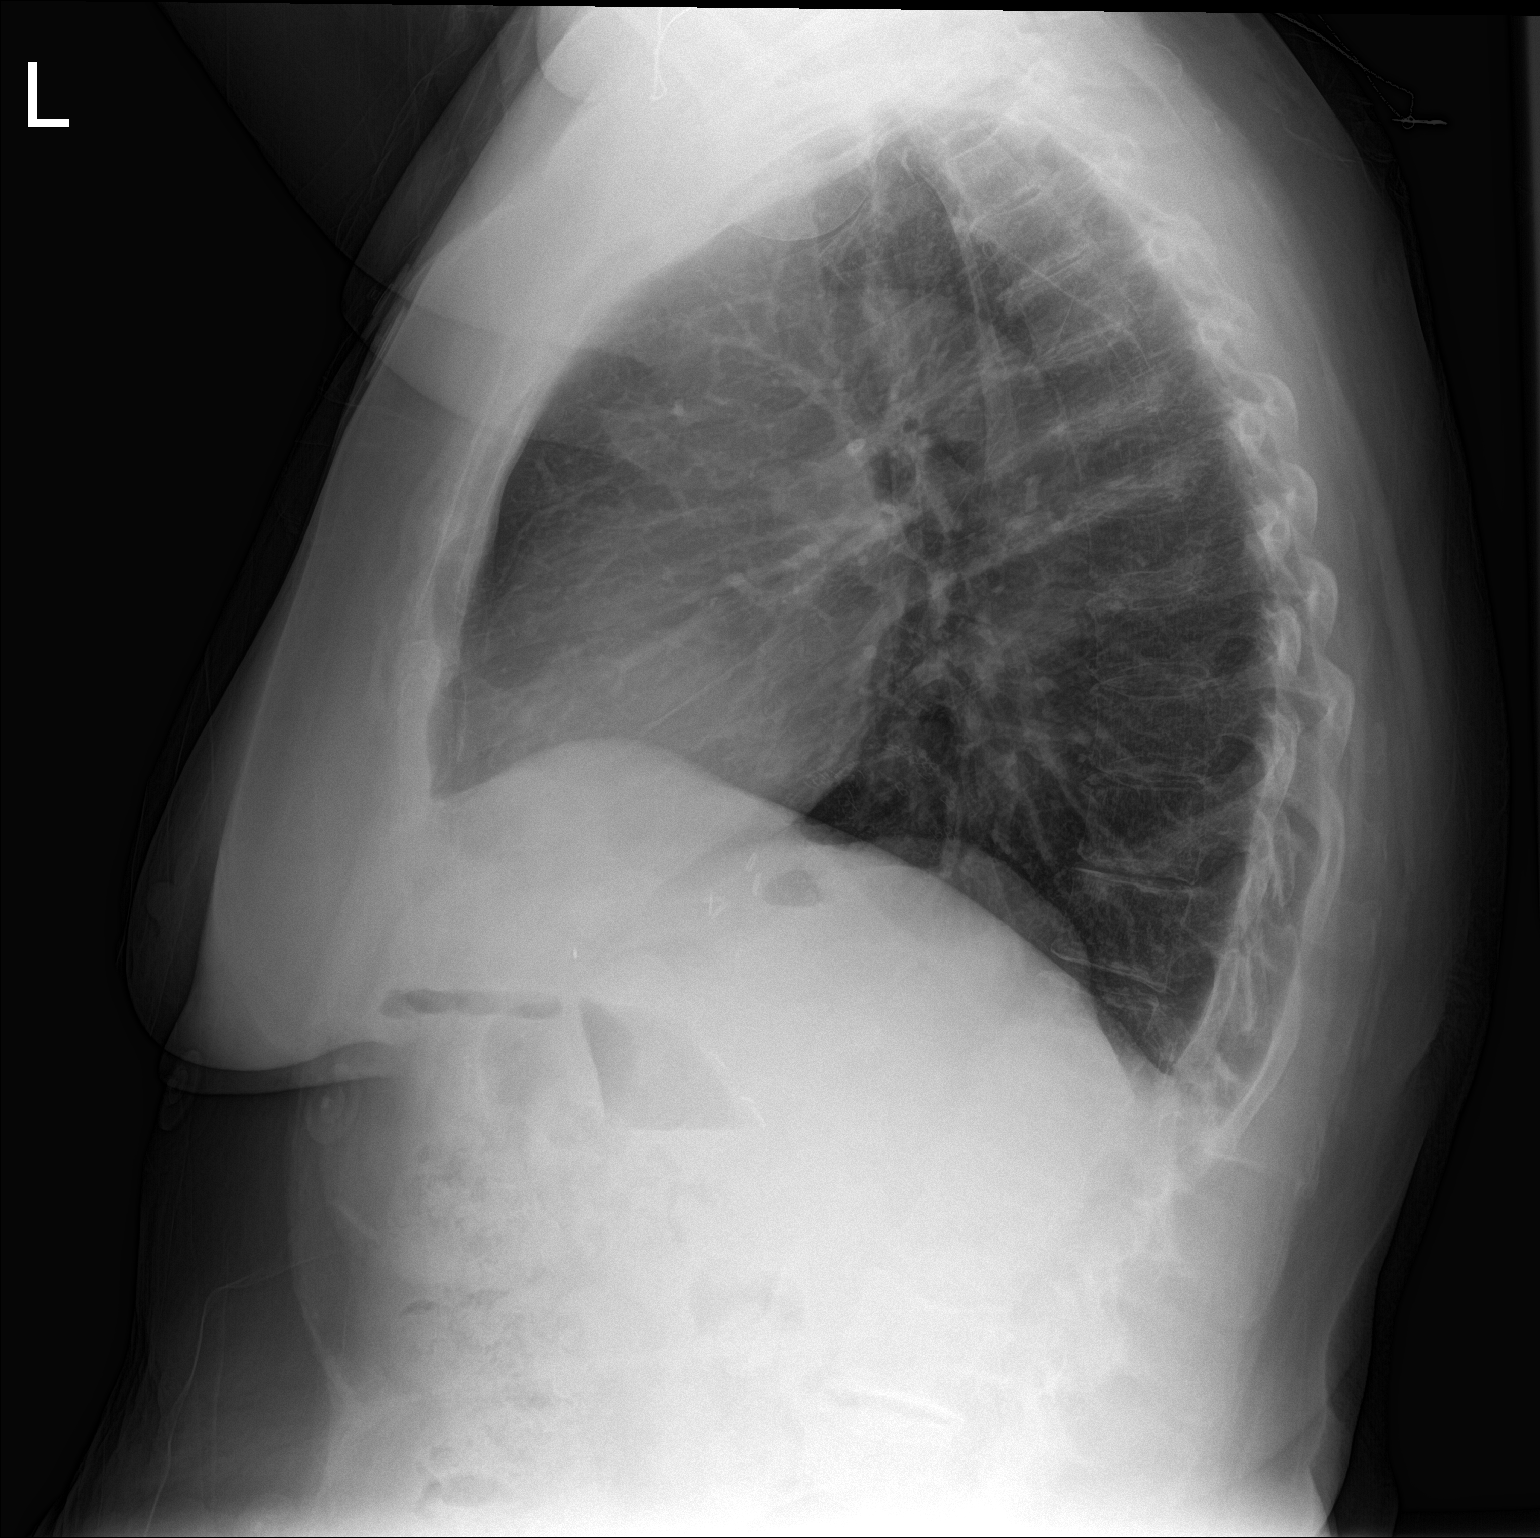

[2 of 2 positions shown; findings below may reference images not displayed]

FINDINGS: The heart size and mediastinal contours are within normal limits.
Both lungs are clear. Stable mild hiatal hernia is noted. No
pneumothorax or pleural effusion is noted. The visualized skeletal
structures are unremarkable.
IMPRESSION: Stable mild hiatal hernia. No acute cardiopulmonary abnormality
seen.

## 2016-06-17 ENCOUNTER — Ambulatory Visit (INDEPENDENT_AMBULATORY_CARE_PROVIDER_SITE_OTHER): Payer: Commercial Managed Care - HMO | Admitting: Internal Medicine

## 2016-06-17 ENCOUNTER — Encounter: Payer: Self-pay | Admitting: Internal Medicine

## 2016-06-17 VITALS — BP 132/88 | HR 112 | Temp 99.8°F | Resp 18 | Ht 63.75 in

## 2016-06-17 DIAGNOSIS — J069 Acute upper respiratory infection, unspecified: Secondary | ICD-10-CM

## 2016-06-17 MED ORDER — PROMETHAZINE-DM 6.25-15 MG/5ML PO SYRP: 5.0000 mL | ORAL_SOLUTION | Freq: Four times a day (QID) | ORAL | 0 refills | Status: DC | PRN

## 2016-06-17 MED ORDER — FLUTICASONE PROPIONATE 50 MCG/ACT NA SUSP: 2.0000 | Freq: Every day | NASAL | 0 refills | Status: DC

## 2016-06-17 MED ORDER — AZELASTINE HCL 0.1 % NA SOLN: 2.0000 | Freq: Two times a day (BID) | NASAL | 2 refills | Status: DC

## 2016-06-17 MED ORDER — ALBUTEROL SULFATE HFA 108 (90 BASE) MCG/ACT IN AERS: 2.0000 | INHALATION_SPRAY | Freq: Four times a day (QID) | RESPIRATORY_TRACT | 0 refills | Status: DC | PRN

## 2016-06-17 MED ORDER — AZITHROMYCIN 250 MG PO TABS: ORAL_TABLET | ORAL | 0 refills | Status: DC

## 2016-06-17 NOTE — Patient Instructions (Signed)
Please take the zpak until it is gone.  Please take the prednisone taper that you have at home the way it is prescribed.  Please use flonase 2 sprays per nostril right before bedtime.  Please take astelin 2 sprays per nostril morning and nighttime.  Please use saline to help rinse out your nose.  Please take phenergan dm up to 3 times per day as needed for coughing,  Please take tylenol or ibuprofen as needed for body aches and fever reduction.  Drink plenty of fluids to help thin out mucous.  Restart the zyrtec.

## 2016-06-17 NOTE — Progress Notes (Signed)
HPI  Patient presents to the office for evaluation of cough.  It has been going on for 5 days.  Patient reports night > day, wet, barky, worse with lying down.  They also endorse change in voice, chills, fever, postnasal drip and ear congestion, nasal congestion, sore throat, headache.  .  They have tried zyrtec, mucinex cough and cold.  They report that nothing has worked.  They denies other sick contacts.  Review of Systems  Constitutional: Positive for malaise/fatigue. Negative for chills and fever.  HENT: Positive for congestion, ear pain, hearing loss and sore throat.   Respiratory: Positive for cough. Negative for sputum production, shortness of breath and wheezing.   Cardiovascular: Negative for chest pain, palpitations and leg swelling.  Neurological: Positive for headaches.    PE:  Vitals:   06/17/16 1505  BP: 132/88  Pulse: (!) 112  Resp: 18  Temp: 99.8 F (37.7 C)   General:  Alert and non-toxic, WDWN, NAD HEENT: NCAT, PERLA, EOM normal, no occular discharge or erythema.  Nasal mucosal edema with sinus tenderness to palpation.  Oropharynx clear with minimal oropharyngeal edema and erythema.  Mucous membranes moist and pink. Neck:  Cervical adenopathy Chest:  RRR no MRGs.  Lungs clear to auscultation A&P with no wheezes rhonchi or rales.   Abdomen: +BS x 4 quadrants, soft, non-tender, no guarding, rigidity, or rebound. Skin: warm and dry no rash Neuro: A&Ox4, CN II-XII grossly intact  Assessment and Plan:   1. Acute URI -zpak -prednisone -flonase -astelin -daily antihistamine -phenergan dm -nasal saline -tylenol or ibuprofen prn

## 2016-06-20 ENCOUNTER — Other Ambulatory Visit: Payer: Self-pay | Admitting: Internal Medicine

## 2016-06-21 ENCOUNTER — Other Ambulatory Visit: Payer: Self-pay | Admitting: Internal Medicine

## 2016-07-01 ENCOUNTER — Other Ambulatory Visit: Payer: Self-pay | Admitting: Internal Medicine

## 2016-07-03 ENCOUNTER — Other Ambulatory Visit: Payer: Self-pay | Admitting: Internal Medicine

## 2016-07-03 ENCOUNTER — Other Ambulatory Visit: Payer: Self-pay | Admitting: Physician Assistant

## 2016-07-10 ENCOUNTER — Ambulatory Visit: Payer: Self-pay | Admitting: Internal Medicine

## 2016-07-12 ENCOUNTER — Ambulatory Visit: Payer: Self-pay | Admitting: Internal Medicine

## 2016-07-18 ENCOUNTER — Encounter: Payer: Self-pay | Admitting: Internal Medicine

## 2016-07-18 ENCOUNTER — Ambulatory Visit (INDEPENDENT_AMBULATORY_CARE_PROVIDER_SITE_OTHER): Payer: Commercial Managed Care - HMO | Admitting: Internal Medicine

## 2016-07-18 VITALS — BP 118/72 | HR 90 | Temp 98.0°F | Resp 18 | Ht 63.75 in | Wt 178.0 lb

## 2016-07-18 DIAGNOSIS — E782 Mixed hyperlipidemia: Secondary | ICD-10-CM | POA: Diagnosis not present

## 2016-07-18 DIAGNOSIS — I10 Essential (primary) hypertension: Secondary | ICD-10-CM | POA: Diagnosis not present

## 2016-07-18 DIAGNOSIS — F321 Major depressive disorder, single episode, moderate: Secondary | ICD-10-CM

## 2016-07-18 DIAGNOSIS — R350 Frequency of micturition: Secondary | ICD-10-CM | POA: Diagnosis not present

## 2016-07-18 DIAGNOSIS — E039 Hypothyroidism, unspecified: Secondary | ICD-10-CM | POA: Diagnosis not present

## 2016-07-18 DIAGNOSIS — R7303 Prediabetes: Secondary | ICD-10-CM

## 2016-07-18 DIAGNOSIS — Z79899 Other long term (current) drug therapy: Secondary | ICD-10-CM

## 2016-07-18 DIAGNOSIS — E559 Vitamin D deficiency, unspecified: Secondary | ICD-10-CM

## 2016-07-18 LAB — HEPATIC FUNCTION PANEL
ALT: 14 U/L (ref 6–29)
AST: 16 U/L (ref 10–35)
Albumin: 4 g/dL (ref 3.6–5.1)
Alkaline Phosphatase: 77 U/L (ref 33–130)
Bilirubin, Direct: 0.1 mg/dL (ref <=0.2)
Indirect Bilirubin: 0.3 mg/dL (ref 0.2–1.2)
Total Bilirubin: 0.4 mg/dL (ref 0.2–1.2)
Total Protein: 6.6 g/dL (ref 6.1–8.1)

## 2016-07-18 LAB — CBC WITH DIFFERENTIAL/PLATELET
Basophils Absolute: 0 cells/uL (ref 0–200)
Basophils Relative: 0 %
Eosinophils Absolute: 37 cells/uL (ref 15–500)
Eosinophils Relative: 1 %
HCT: 38.7 % (ref 35.0–45.0)
Hemoglobin: 13.1 g/dL (ref 11.7–15.5)
Lymphocytes Relative: 28 %
Lymphs Abs: 1036 cells/uL (ref 850–3900)
MCH: 31.1 pg (ref 27.0–33.0)
MCHC: 33.9 g/dL (ref 32.0–36.0)
MCV: 91.9 fL (ref 80.0–100.0)
MPV: 9.7 fL (ref 7.5–12.5)
Monocytes Absolute: 296 cells/uL (ref 200–950)
Monocytes Relative: 8 %
Neutro Abs: 2331 cells/uL (ref 1500–7800)
Neutrophils Relative %: 63 %
Platelets: 217 10*3/uL (ref 140–400)
RBC: 4.21 MIL/uL (ref 3.80–5.10)
RDW: 13.6 % (ref 11.0–15.0)
WBC: 3.7 10*3/uL — ABNORMAL LOW (ref 3.8–10.8)

## 2016-07-18 LAB — LIPID PANEL
Cholesterol: 178 mg/dL (ref <200)
HDL: 60 mg/dL (ref >50)
LDL Cholesterol: 89 mg/dL (ref <100)
Total CHOL/HDL Ratio: 3 Ratio (ref <5.0)
Triglycerides: 147 mg/dL (ref <150)
VLDL: 29 mg/dL (ref <30)

## 2016-07-18 LAB — BASIC METABOLIC PANEL WITH GFR
BUN: 14 mg/dL (ref 7–25)
CO2: 24 mmol/L (ref 20–31)
Calcium: 9.5 mg/dL (ref 8.6–10.4)
Chloride: 103 mmol/L (ref 98–110)
Creat: 0.97 mg/dL — ABNORMAL HIGH (ref 0.60–0.93)
GFR, Est African American: 68 mL/min (ref >=60)
GFR, Est Non African American: 59 mL/min — ABNORMAL LOW (ref >=60)
Glucose, Bld: 89 mg/dL (ref 65–99)
Potassium: 4.1 mmol/L (ref 3.5–5.3)
Sodium: 139 mmol/L (ref 135–146)

## 2016-07-18 LAB — HEMOGLOBIN A1C
Hgb A1c MFr Bld: 5.1 % (ref <5.7)
Mean Plasma Glucose: 100 mg/dL

## 2016-07-18 LAB — TSH: TSH: 2.27 mIU/L

## 2016-07-18 NOTE — Progress Notes (Signed)
Assessment and Plan:  Hypertension:  -Continue medication,  -monitor blood pressure at home.  -Continue DASH diet.   -Reminder to go to the ER if any CP, SOB, nausea, dizziness, severe HA, changes vision/speech, left arm numbness and tingling, and jaw pain.  Cholesterol: -Continue diet and exercise.  -Check cholesterol.   Pre-diabetes: -Continue diet and exercise.  -Check A1C  Vitamin D Def: -continue medications.   Urinary Frequency -UA -Urine Culture -wait for results before abx -azo if desired  Depressions -discussed that boredom eating related to this -increase activity level -start volunteering or finding new ways to fill time.  Hypothyroidism -TSH -levothyroxine -dose adjust if necessary  Continue diet and meds as discussed. Further disposition pending results of labs.  HPI 70 y.o. female  presents for 3 month follow up with hypertension, hyperlipidemia, prediabetes and vitamin D.   Her blood pressure has been controlled at home, today their BP is BP: 118/72.   She does not workout. She denies chest pain, shortness of breath, dizziness.   She is on cholesterol medication and denies myalgias. Her cholesterol is at goal. The cholesterol last visit was:   Lab Results  Component Value Date   CHOL 166 04/02/2016   HDL 70 04/02/2016   LDLCALC 71 04/02/2016   TRIG 126 04/02/2016   CHOLHDL 2.4 04/02/2016     She has been working on diet and exercise for prediabetes, and denies foot ulcerations, hyperglycemia, hypoglycemia , increased appetite, nausea, paresthesia of the feet, polydipsia, polyuria, visual disturbances, vomiting and weight loss. Last A1C in the office was:  Lab Results  Component Value Date   HGBA1C 4.8 04/02/2016  She has been struggling more with craving for sweets and sugars and has been doing a lot of boredom eating.  She misses her sons and her husband who has passed.    Patient is on Vitamin D supplement.  Lab Results  Component Value Date    VD25OH 51 09/26/2015     She notes that she has been having some urinary frequency and urgency x 2-3 weeks.  She has some hesitancy, but no pain with urinating.  She has no abdominal pressure, flank pain, nausea or vomiting.  No fevers or chills.     Current Medications:  Current Outpatient Prescriptions on File Prior to Visit  Medication Sig Dispense Refill  . acetaminophen (TYLENOL) 500 MG tablet Take 500 mg by mouth 3 (three) times daily.    Marland Kitchen amitriptyline (ELAVIL) 25 MG tablet TAKE 2 TABLETS AT BEDTIME 180 tablet 1  . aspirin 325 MG tablet Take 325 mg by mouth daily.    Marland Kitchen azelastine (ASTELIN) 0.1 % nasal spray Place 2 sprays into both nostrils 2 (two) times daily. Use in each nostril as directed 30 mL 2  . Biotin 1 MG CAPS Take 1 mg by mouth daily.     . Calcium Carb-Cholecalciferol (CALCIUM-VITAMIN D) 600-400 MG-UNIT TABS Take 1 tablet by mouth daily.    . Cholecalciferol (VITAMIN D3) 2000 UNITS capsule Take 6,000 Units by mouth daily.    Marland Kitchen docusate sodium (COLACE) 100 MG capsule Take 1 capsule (100 mg total) by mouth 2 (two) times daily. (Patient taking differently: Take 100 mg by mouth daily as needed for mild constipation or moderate constipation. ) 10 capsule 0  . folic acid (FOLVITE) 400 MCG tablet Take 400 mcg by mouth daily.    Marland Kitchen levothyroxine (SYNTHROID, LEVOTHROID) 112 MCG tablet TAKE 1 TABLET EVERY DAY 90 tablet 1  . lisinopril (PRINIVIL,ZESTRIL)  20 MG tablet TAKE 1 TABLET EVERY DAY FOR BLOOD PRESSURE 90 tablet 1  . MAGNESIUM OXIDE PO Take 400 mg by mouth daily. Reported on 12/20/2015    . methocarbamol (ROBAXIN) 500 MG tablet TAKE 1 TABLET THREE TIMES DAILY AS NEEDED FOR MUSCLE SPASM(S) 90 tablet 0  . omeprazole (PRILOSEC) 40 MG capsule TAKE 1 CAPSULE EVERY DAY 90 capsule 1  . simvastatin (ZOCOR) 40 MG tablet TAKE 1 TABLET EVERY EVENING 90 tablet 0  . traZODone (DESYREL) 150 MG tablet Take 1 tablet (150 mg total) by mouth at bedtime. 90 tablet 1   No current  facility-administered medications on file prior to visit.     Medical History:  Past Medical History:  Diagnosis Date  . Allergy   . Arthritis   . Depression   . Essential hypertension    Normal cardiolite 05/2006 EF 71%  . GERD (gastroesophageal reflux disease)   . Headache   . Hyperlipidemia   . Hypothyroidism   . Mini stroke (HCC) 2011   . Occipital neuralgia   . Prediabetes   . Urinary tract infection   . Vitamin D deficiency     Allergies: No Known Allergies   Review of Systems:  Review of Systems  Constitutional: Negative for chills, fever and malaise/fatigue.  HENT: Negative for congestion, ear pain and sore throat.   Eyes: Negative.   Respiratory: Negative for cough, shortness of breath and wheezing.   Cardiovascular: Negative for chest pain, palpitations and leg swelling.  Gastrointestinal: Negative for abdominal pain, blood in stool, constipation, diarrhea, heartburn and melena.  Genitourinary: Positive for frequency and urgency. Negative for dysuria, flank pain and hematuria.  Skin: Negative.   Neurological: Negative for dizziness, sensory change, loss of consciousness and headaches.  Psychiatric/Behavioral: Positive for depression. The patient is not nervous/anxious and does not have insomnia.     Family history- Review and unchanged  Social history- Review and unchanged  Physical Exam: BP 118/72   Pulse 90   Temp 98 F (36.7 C) (Temporal)   Resp 18   Ht 5' 3.75" (1.619 m)   Wt 178 lb (80.7 kg)   BMI 30.79 kg/m  Wt Readings from Last 3 Encounters:  07/18/16 178 lb (80.7 kg)  04/02/16 167 lb (75.8 kg)  12/28/15 160 lb (72.6 kg)    General Appearance: Well nourished well developed, in no apparent distress. Eyes: PERRLA, EOMs, conjunctiva no swelling or erythema ENT/Mouth: Ear canals normal without obstruction, swelling, erythma, discharge.  TMs normal bilaterally.  Oropharynx moist, clear, without exudate, or postoropharyngeal swelling. Neck:  Supple, thyroid normal,no cervical adenopathy  Respiratory: Respiratory effort normal, Breath sounds clear A&P without rhonchi, wheeze, or rale.  No retractions, no accessory usage. Cardio: RRR with no MRGs. Brisk peripheral pulses without edema.  Abdomen: Soft, + BS,  Non tender, no guarding, rebound, hernias, masses. Musculoskeletal: Full ROM, 5/5 strength, Normal gait Skin: Warm, dry without rashes, lesions, ecchymosis.  Neuro: Awake and oriented X 3, Cranial nerves intact. Normal muscle tone, no cerebellar symptoms. Psych: Normal affect, Insight and Judgment appropriate.    Terri Piedraourtney Forcucci, PA-C 11:38 AM [facility]

## 2016-07-19 ENCOUNTER — Other Ambulatory Visit: Payer: Self-pay | Admitting: Internal Medicine

## 2016-07-19 LAB — URINALYSIS, ROUTINE W REFLEX MICROSCOPIC
Bilirubin Urine: NEGATIVE
Glucose, UA: NEGATIVE
Hgb urine dipstick: NEGATIVE
Ketones, ur: NEGATIVE
Nitrite: NEGATIVE
Protein, ur: NEGATIVE
Specific Gravity, Urine: 1.013 (ref 1.001–1.035)
pH: 5.5 (ref 5.0–8.0)

## 2016-07-19 LAB — URINALYSIS, MICROSCOPIC ONLY
Casts: NONE SEEN [LPF]
Crystals: NONE SEEN [HPF]
Squamous Epithelial / LPF: NONE SEEN [HPF] (ref <=5)
WBC, UA: >60 WBC/HPF — AB (ref <=5)
Yeast: NONE SEEN [HPF]

## 2016-07-19 MED ORDER — CIPROFLOXACIN HCL 500 MG PO TABS: 500.0000 mg | ORAL_TABLET | Freq: Two times a day (BID) | ORAL | 0 refills | Status: AC

## 2016-07-20 LAB — URINE CULTURE: Colony Count: >=100000

## 2016-07-22 ENCOUNTER — Other Ambulatory Visit: Payer: Self-pay | Admitting: Internal Medicine

## 2016-07-23 NOTE — Progress Notes (Signed)
Medication has been given.

## 2016-08-05 DIAGNOSIS — J189 Pneumonia, unspecified organism: Secondary | ICD-10-CM

## 2016-08-05 HISTORY — DX: Pneumonia, unspecified organism: J18.9

## 2016-08-17 ENCOUNTER — Other Ambulatory Visit: Payer: Self-pay | Admitting: Internal Medicine

## 2016-09-09 ENCOUNTER — Other Ambulatory Visit: Payer: Self-pay | Admitting: Internal Medicine

## 2016-09-11 ENCOUNTER — Other Ambulatory Visit: Payer: Self-pay | Admitting: Internal Medicine

## 2016-09-11 DIAGNOSIS — E039 Hypothyroidism, unspecified: Secondary | ICD-10-CM

## 2016-09-21 ENCOUNTER — Other Ambulatory Visit: Payer: Self-pay | Admitting: Physician Assistant

## 2016-09-25 ENCOUNTER — Encounter: Payer: Self-pay | Admitting: Internal Medicine

## 2016-10-08 ENCOUNTER — Encounter: Payer: Self-pay | Admitting: Internal Medicine

## 2016-10-08 ENCOUNTER — Ambulatory Visit (INDEPENDENT_AMBULATORY_CARE_PROVIDER_SITE_OTHER): Payer: Medicare HMO | Admitting: Internal Medicine

## 2016-10-08 ENCOUNTER — Other Ambulatory Visit: Payer: Self-pay | Admitting: Internal Medicine

## 2016-10-08 VITALS — BP 140/88 | HR 80 | Temp 98.0°F | Resp 16 | Ht 63.25 in | Wt 175.0 lb

## 2016-10-08 DIAGNOSIS — Z0000 Encounter for general adult medical examination without abnormal findings: Secondary | ICD-10-CM

## 2016-10-08 DIAGNOSIS — I10 Essential (primary) hypertension: Secondary | ICD-10-CM

## 2016-10-08 DIAGNOSIS — E559 Vitamin D deficiency, unspecified: Secondary | ICD-10-CM

## 2016-10-08 DIAGNOSIS — E782 Mixed hyperlipidemia: Secondary | ICD-10-CM

## 2016-10-08 DIAGNOSIS — Z79899 Other long term (current) drug therapy: Secondary | ICD-10-CM

## 2016-10-08 DIAGNOSIS — Z136 Encounter for screening for cardiovascular disorders: Secondary | ICD-10-CM

## 2016-10-08 DIAGNOSIS — K219 Gastro-esophageal reflux disease without esophagitis: Secondary | ICD-10-CM

## 2016-10-08 DIAGNOSIS — R7303 Prediabetes: Secondary | ICD-10-CM | POA: Diagnosis not present

## 2016-10-08 DIAGNOSIS — N390 Urinary tract infection, site not specified: Secondary | ICD-10-CM

## 2016-10-08 DIAGNOSIS — E039 Hypothyroidism, unspecified: Secondary | ICD-10-CM

## 2016-10-08 DIAGNOSIS — Z96659 Presence of unspecified artificial knee joint: Secondary | ICD-10-CM

## 2016-10-08 DIAGNOSIS — F321 Major depressive disorder, single episode, moderate: Secondary | ICD-10-CM

## 2016-10-08 LAB — HEPATIC FUNCTION PANEL
ALT: 18 U/L (ref 6–29)
AST: 21 U/L (ref 10–35)
Albumin: 4.3 g/dL (ref 3.6–5.1)
Alkaline Phosphatase: 94 U/L (ref 33–130)
Bilirubin, Direct: 0.1 mg/dL (ref <=0.2)
Indirect Bilirubin: 0.3 mg/dL (ref 0.2–1.2)
Total Bilirubin: 0.4 mg/dL (ref 0.2–1.2)
Total Protein: 7 g/dL (ref 6.1–8.1)

## 2016-10-08 LAB — BASIC METABOLIC PANEL WITH GFR
BUN: 18 mg/dL (ref 7–25)
CO2: 25 mmol/L (ref 20–31)
Calcium: 10 mg/dL (ref 8.6–10.4)
Chloride: 103 mmol/L (ref 98–110)
Creat: 1.02 mg/dL — ABNORMAL HIGH (ref 0.60–0.93)
GFR, Est African American: 64 mL/min (ref >=60)
GFR, Est Non African American: 55 mL/min — ABNORMAL LOW (ref >=60)
Glucose, Bld: 116 mg/dL — ABNORMAL HIGH (ref 65–99)
Potassium: 4.1 mmol/L (ref 3.5–5.3)
Sodium: 139 mmol/L (ref 135–146)

## 2016-10-08 LAB — LIPID PANEL
Cholesterol: 127 mg/dL (ref <200)
HDL: 62 mg/dL (ref >50)
LDL Cholesterol: 46 mg/dL (ref <100)
Total CHOL/HDL Ratio: 2 Ratio (ref <5.0)
Triglycerides: 93 mg/dL (ref <150)
VLDL: 19 mg/dL (ref <30)

## 2016-10-08 LAB — CBC WITH DIFFERENTIAL/PLATELET
Basophils Absolute: 45 cells/uL (ref 0–200)
Basophils Relative: 1 %
Eosinophils Absolute: 135 cells/uL (ref 15–500)
Eosinophils Relative: 3 %
HCT: 41.6 % (ref 35.0–45.0)
Hemoglobin: 14 g/dL (ref 11.7–15.5)
Lymphocytes Relative: 22 %
Lymphs Abs: 990 cells/uL (ref 850–3900)
MCH: 31.4 pg (ref 27.0–33.0)
MCHC: 33.7 g/dL (ref 32.0–36.0)
MCV: 93.3 fL (ref 80.0–100.0)
MPV: 10.8 fL (ref 7.5–12.5)
Monocytes Absolute: 270 cells/uL (ref 200–950)
Monocytes Relative: 6 %
Neutro Abs: 3060 cells/uL (ref 1500–7800)
Neutrophils Relative %: 68 %
Platelets: 191 10*3/uL (ref 140–400)
RBC: 4.46 MIL/uL (ref 3.80–5.10)
RDW: 13.4 % (ref 11.0–15.0)
WBC: 4.5 10*3/uL (ref 3.8–10.8)

## 2016-10-08 LAB — TSH: TSH: 2.97 mIU/L

## 2016-10-08 LAB — MAGNESIUM: Magnesium: 1.9 mg/dL (ref 1.5–2.5)

## 2016-10-08 MED ORDER — CIPROFLOXACIN HCL 500 MG PO TABS: 500.0000 mg | ORAL_TABLET | Freq: Two times a day (BID) | ORAL | 0 refills | Status: AC

## 2016-10-08 MED ORDER — MELOXICAM 7.5 MG PO TABS: 7.5000 mg | ORAL_TABLET | Freq: Every day | ORAL | 2 refills | Status: DC

## 2016-10-08 NOTE — Progress Notes (Signed)
Complete Physical  Assessment and Plan:   1. Essential hypertension -cont medications -well controlled -dash diet -monitor at home  Exercise as tolerated - Urinalysis, Routine w reflex microscopic - Microalbumin / creatinine urine ratio - EKG 12-Lead - TSH  2. Hypothyroidism, unspecified type -cont synthroid -dose adjust if necessary -TSH  3. Mixed hyperlipidemia -cont statin -diet and exercise - Lipid panel  4. Prediabetes -cont diet and exercise - Hemoglobin A1c - Insulin, random  5. Vitamin D deficiency -cont supplement - VITAMIN D 25 Hydroxy (Vit-D Deficiency, Fractures)  6. Medication management  - CBC with Differential/Platelet - BASIC METABOLIC PANEL WITH GFR - Hepatic function panel - Magnesium  7. Gastroesophageal reflux disease, esophagitis presence not specified -cont omeprazole -zantac as needed or breakthrough -avoid trigger foods  8. Status post total knee replacement, unspecified laterality -doing well  9. Moderate single current episode of major depressive disorder (HCC) -on elavil and trazodone -patient reports no increased symptoms  10. Morbid obesity (HCC) -cont diet and exercise  11.  Recurrent UTI -UA -Urine culture -treat for infection if present -discussed daily macrobid vs. Trimethoprim for prophylaxis and prevention x 3 months  Discussed med's effects and SE's. Screening labs and tests as requested with regular follow-up as recommended.  HPI  71 y.o. female  presents for a complete physical.  Her blood pressure has been controlled at home, today their BP is BP: 140/88.  She does not workout. She denies chest pain, shortness of breath, dizziness.   She is on cholesterol medication and denies myalgias. Her cholesterol is at goal. The cholesterol last visit was:  Lab Results  Component Value Date   CHOL 178 07/18/2016   HDL 60 07/18/2016   LDLCALC 89 07/18/2016   TRIG 147 07/18/2016   CHOLHDL 3.0 07/18/2016  .  She  has been working on diet and exercise for prediabetes, she is on bASA, she is on ACE/ARB and denies foot ulcerations, hyperglycemia, hypoglycemia , increased appetite, nausea, paresthesia of the feet, polydipsia, polyuria, visual disturbances, vomiting and weight loss. Last A1C in the office was:  Lab Results  Component Value Date   HGBA1C 5.1 07/18/2016    Patient is on Vitamin D supplement.   Lab Results  Component Value Date   VD25OH 4 09/26/2015     She reports that she is currently taking care of her two sons in-laws.  This taking up the majority of her free time.  She feels extra stress but reports that she feels that her depression is stable currently.    She reports that she is having some more urinary symptoms.  She reports that she is wiping front to back and she reports that she has been drinking plenty of water.  She does get recurrent UTIs and wonders what else we can do to try to prevent this from happening.  No flank pain, no nausea, vomiting, vaginal discharge or odor.    She still takes her thyroid medication daily in the morning on an empty stomach.  She is not having difficulty swallowing swelling in her neck or palpitations.    Current Medications:  Current Outpatient Prescriptions on File Prior to Visit  Medication Sig Dispense Refill  . acetaminophen (TYLENOL) 500 MG tablet Take 500 mg by mouth 3 (three) times daily.    Marland Kitchen amitriptyline (ELAVIL) 25 MG tablet TAKE 2 TABLETS AT BEDTIME 180 tablet 1  . aspirin 325 MG tablet Take 325 mg by mouth daily.    Marland Kitchen azelastine (ASTELIN) 0.1 %  nasal spray Place 2 sprays into both nostrils 2 (two) times daily. Use in each nostril as directed 30 mL 2  . Biotin 1 MG CAPS Take 1 mg by mouth daily.     . Calcium Carb-Cholecalciferol (CALCIUM-VITAMIN D) 600-400 MG-UNIT TABS Take 1 tablet by mouth daily.    . Cholecalciferol (VITAMIN D3) 2000 UNITS capsule Take 6,000 Units by mouth daily.    Marland Kitchen docusate sodium (COLACE) 100 MG capsule Take  1 capsule (100 mg total) by mouth 2 (two) times daily. (Patient taking differently: Take 100 mg by mouth daily as needed for mild constipation or moderate constipation. ) 10 capsule 0  . folic acid (FOLVITE) 400 MCG tablet Take 400 mcg by mouth daily.    Marland Kitchen levothyroxine (SYNTHROID, LEVOTHROID) 112 MCG tablet TAKE 1 TABLET EVERY DAY 90 tablet 1  . lisinopril (PRINIVIL,ZESTRIL) 20 MG tablet TAKE 1 TABLET EVERY DAY FOR BLOOD PRESSURE 90 tablet 1  . MAGNESIUM OXIDE PO Take 400 mg by mouth daily. Reported on 12/20/2015    . methocarbamol (ROBAXIN) 500 MG tablet TAKE 1 TABLET THREE TIMES DAILY AS NEEDED FOR MUSCLE SPASM(S) 90 tablet 0  . omeprazole (PRILOSEC) 40 MG capsule TAKE 1 CAPSULE EVERY DAY 90 capsule 1  . simvastatin (ZOCOR) 40 MG tablet TAKE 1 TABLET EVERY EVENING 90 tablet 0  . traZODone (DESYREL) 150 MG tablet Take 1 tablet (150 mg total) by mouth at bedtime. 90 tablet 1   No current facility-administered medications on file prior to visit.     Health Maintenance:   Immunization History  Administered Date(s) Administered  . DT 09/26/2015  . Influenza, High Dose Seasonal PF 06/16/2015, 04/02/2016  . Influenza-Unspecified 04/18/2013, 04/05/2014  . PPD Test 04/19/2015  . Pneumococcal Conjugate-13 06/16/2015  . Pneumococcal-Unspecified 08/18/2010  . Td 08/18/2005  . Zoster 08/18/2005   MGM:2016 DEXA:2016 Colonoscopy:2012 Last Dental Exam:  Dentures Last Eye Exam: Has not been recently.    Patient Care Team: Lucky Cowboy, MD as PCP - General (Internal Medicine) Charlott Rakes, MD as Consulting Physician (Gastroenterology) Salvatore Marvel, MD as Consulting Physician (Orthopedic Surgery)  Allergies: No Known Allergies  Medical History:  Past Medical History:  Diagnosis Date  . Allergy   . Arthritis   . Depression   . Essential hypertension    Normal cardiolite 05/2006 EF 71%  . GERD (gastroesophageal reflux disease)   . Headache   . Hyperlipidemia   . Hypothyroidism    . Mini stroke (HCC) 2011   . Occipital neuralgia   . Prediabetes   . Urinary tract infection   . Vitamin D deficiency     Surgical History:  Past Surgical History:  Procedure Laterality Date  . ABDOMINAL HYSTERECTOMY    . BLADDER SURGERY    . BREAST BIOPSY Right Over 20 years    Benign  . CHOLECYSTECTOMY    . gastroplication     . JOINT REPLACEMENT Left   . KNEE ARTHROSCOPY Left 2011  . TONSILLECTOMY AND ADENOIDECTOMY    . TOTAL KNEE ARTHROPLASTY Left 04/17/2015   Procedure: LEFT TOTAL KNEE ARTHROPLASTY;  Surgeon: Durene Romans, MD;  Location: [facility];  Service: Orthopedics;  Laterality: Left;    Family History:  Family History  Problem Relation Age of Onset  . Heart disease Mother   . Hypertension Mother   . Diabetes Mother   . Heart disease Father   . Breast cancer Maternal Aunt     Social History:  Social History  Substance Use Topics  .  Smoking status: Never Smoker  . Smokeless tobacco: Never Used  . Alcohol use No    Review of Systems: Review of Systems  Constitutional: Negative for chills, fever and malaise/fatigue.  HENT: Negative for congestion, ear pain and sore throat.   Eyes: Negative.   Respiratory: Negative for cough, shortness of breath and wheezing.   Cardiovascular: Negative for chest pain, palpitations and leg swelling.  Gastrointestinal: Negative for abdominal pain, blood in stool, constipation, diarrhea, heartburn and melena.  Genitourinary: Positive for dysuria, frequency and urgency. Negative for flank pain and hematuria.  Skin: Negative.   Neurological: Negative for dizziness, sensory change, loss of consciousness and headaches.  Psychiatric/Behavioral: Negative for depression. The patient is not nervous/anxious and does not have insomnia.     Physical Exam: Estimated body mass index is 30.76 kg/m as calculated from the following:   Height as of this encounter: 5' 3.25" (1.607 m).   Weight as of this encounter: 175 lb (79.4 kg). BP  140/88   Pulse 80   Temp 98 F (36.7 C) (Temporal)   Resp 16   Ht 5' 3.25" (1.607 m)   Wt 175 lb (79.4 kg)   BMI 30.76 kg/m   General Appearance: Well nourished well developed, in no apparent distress.  Eyes: PERRLA, EOMs, conjunctiva no swelling or erythema ENT/Mouth: Ear canals normal without obstruction, swelling, erythema, or discharge.  TMs normal bilaterally with no erythema, bulging, retraction, or loss of landmark.  Oropharynx moist and clear with no exudate, erythema, or swelling.   Neck: Supple, thyroid normal. No bruits.  No cervical adenopathy Respiratory: Respiratory effort normal, Breath sounds clear A&P without wheeze, rhonchi, rales.   Cardio: RRR without murmurs, rubs or gallops. Brisk peripheral pulses without edema.  Chest: symmetric, with normal excursions Breasts: Symmetric, without lumps, nipple discharge, retractions.  Abdomen: Soft, nontender, no guarding, rebound, hernias, masses, or organomegaly.  Lymphatics: Non tender without lymphadenopathy.  Genitourinary:  Musculoskeletal: Full ROM all peripheral extremities,5/5 strength, and normal gait.  Skin: Warm, dry without rashes, lesions, ecchymosis. Neuro: Awake and oriented X 3, Cranial nerves intact, reflexes equal bilaterally. Normal muscle tone, no cerebellar symptoms. Sensation intact.  Psych:  normal affect, Insight and Judgment appropriate.   EKG: WNL no changes.  Over 40 minutes of exam, counseling, chart review and critical decision making was performed  Toni Amend Forcucci 10:52 AM [facility]

## 2016-10-09 LAB — URINALYSIS, MICROSCOPIC ONLY
Casts: NONE SEEN [LPF]
Crystals: NONE SEEN [HPF]
Squamous Epithelial / LPF: NONE SEEN [HPF] (ref <=5)
WBC, UA: >60 WBC/HPF — AB (ref <=5)
Yeast: NONE SEEN [HPF]

## 2016-10-09 LAB — URINALYSIS, ROUTINE W REFLEX MICROSCOPIC
Bilirubin Urine: NEGATIVE
Glucose, UA: NEGATIVE
Hgb urine dipstick: NEGATIVE
Ketones, ur: NEGATIVE
Nitrite: NEGATIVE
Protein, ur: NEGATIVE
Specific Gravity, Urine: 1.013 (ref 1.001–1.035)
pH: 5.5 (ref 5.0–8.0)

## 2016-10-09 LAB — MICROALBUMIN / CREATININE URINE RATIO
Creatinine, Urine: 64 mg/dL (ref 20–320)
Microalb Creat Ratio: 19 mcg/mg creat (ref <30)
Microalb, Ur: 1.2 mg/dL

## 2016-10-09 LAB — HEMOGLOBIN A1C
Hgb A1c MFr Bld: 5 % (ref <5.7)
Mean Plasma Glucose: 97 mg/dL

## 2016-10-09 LAB — VITAMIN D 25 HYDROXY (VIT D DEFICIENCY, FRACTURES): Vit D, 25-Hydroxy: 42 ng/mL (ref 30–100)

## 2016-10-09 LAB — INSULIN, RANDOM: Insulin: 23.6 u[IU]/mL — ABNORMAL HIGH (ref 2.0–19.6)

## 2016-10-11 LAB — URINE CULTURE

## 2016-11-15 ENCOUNTER — Ambulatory Visit (INDEPENDENT_AMBULATORY_CARE_PROVIDER_SITE_OTHER): Payer: Medicare HMO

## 2016-11-15 DIAGNOSIS — R8271 Bacteriuria: Secondary | ICD-10-CM

## 2016-11-15 NOTE — Progress Notes (Signed)
Pt presents for follow up U/A & U/C. Pt states she did complete her ABX.

## 2016-11-16 LAB — URINALYSIS, ROUTINE W REFLEX MICROSCOPIC
Bilirubin Urine: NEGATIVE
Glucose, UA: NEGATIVE
Ketones, ur: NEGATIVE
Nitrite: NEGATIVE
Protein, ur: NEGATIVE
Specific Gravity, Urine: 1.012 (ref 1.001–1.035)
pH: 6.5 (ref 5.0–8.0)

## 2016-11-16 LAB — URINALYSIS, MICROSCOPIC ONLY
Casts: NONE SEEN [LPF]
Crystals: NONE SEEN [HPF]
Squamous Epithelial / LPF: NONE SEEN [HPF] (ref <=5)
Yeast: NONE SEEN [HPF]

## 2016-11-17 LAB — URINE CULTURE

## 2016-11-18 ENCOUNTER — Other Ambulatory Visit: Payer: Self-pay | Admitting: Internal Medicine

## 2016-11-18 DIAGNOSIS — N3090 Cystitis, unspecified without hematuria: Secondary | ICD-10-CM

## 2016-11-18 MED ORDER — CIPROFLOXACIN HCL 250 MG PO TABS: ORAL_TABLET | ORAL | 0 refills | Status: DC

## 2016-11-20 ENCOUNTER — Other Ambulatory Visit: Payer: Self-pay | Admitting: Internal Medicine

## 2016-12-16 ENCOUNTER — Other Ambulatory Visit: Payer: Self-pay | Admitting: *Deleted

## 2016-12-16 ENCOUNTER — Other Ambulatory Visit: Payer: Self-pay | Admitting: Internal Medicine

## 2016-12-16 MED ORDER — OMEPRAZOLE 40 MG PO CPDR: 40.0000 mg | DELAYED_RELEASE_CAPSULE | Freq: Every day | ORAL | 1 refills | Status: DC

## 2016-12-17 ENCOUNTER — Ambulatory Visit (INDEPENDENT_AMBULATORY_CARE_PROVIDER_SITE_OTHER): Payer: Medicare HMO

## 2016-12-17 DIAGNOSIS — R8271 Bacteriuria: Secondary | ICD-10-CM | POA: Diagnosis not present

## 2016-12-17 NOTE — Progress Notes (Signed)
Pt presents for U/A & culture. Pt has completed ABX & had no questions or concerns.

## 2016-12-18 LAB — URINALYSIS, ROUTINE W REFLEX MICROSCOPIC
Bilirubin Urine: NEGATIVE
Glucose, UA: NEGATIVE
Hgb urine dipstick: NEGATIVE
Ketones, ur: NEGATIVE
Nitrite: NEGATIVE
Protein, ur: NEGATIVE
Specific Gravity, Urine: 1.016 (ref 1.001–1.035)
pH: 5.5 (ref 5.0–8.0)

## 2016-12-18 LAB — URINALYSIS, MICROSCOPIC ONLY
Bacteria, UA: NONE SEEN [HPF]
Casts: NONE SEEN [LPF]
Crystals: NONE SEEN [HPF]
Squamous Epithelial / HPF: NONE SEEN [HPF] (ref <=5)
Yeast: NONE SEEN [HPF]

## 2016-12-18 LAB — URINE CULTURE: Organism ID, Bacteria: NO GROWTH

## 2016-12-19 NOTE — Progress Notes (Signed)
Pt aware of lab results & voiced understanding of those results.

## 2016-12-20 ENCOUNTER — Other Ambulatory Visit: Payer: Self-pay | Admitting: Internal Medicine

## 2016-12-22 ENCOUNTER — Other Ambulatory Visit: Payer: Self-pay | Admitting: Internal Medicine

## 2016-12-22 MED ORDER — CEPHALEXIN 500 MG PO CAPS: ORAL_CAPSULE | ORAL | 1 refills | Status: DC

## 2017-01-07 ENCOUNTER — Encounter: Payer: Self-pay | Admitting: Internal Medicine

## 2017-01-07 ENCOUNTER — Ambulatory Visit (INDEPENDENT_AMBULATORY_CARE_PROVIDER_SITE_OTHER): Payer: Medicare HMO | Admitting: Internal Medicine

## 2017-01-07 VITALS — BP 112/82 | HR 96 | Temp 97.5°F | Resp 16 | Ht 63.25 in | Wt 174.4 lb

## 2017-01-07 DIAGNOSIS — R531 Weakness: Secondary | ICD-10-CM

## 2017-01-07 DIAGNOSIS — Z79899 Other long term (current) drug therapy: Secondary | ICD-10-CM | POA: Diagnosis not present

## 2017-01-07 DIAGNOSIS — E039 Hypothyroidism, unspecified: Secondary | ICD-10-CM | POA: Diagnosis not present

## 2017-01-07 DIAGNOSIS — M791 Myalgia, unspecified site: Secondary | ICD-10-CM

## 2017-01-07 DIAGNOSIS — I10 Essential (primary) hypertension: Secondary | ICD-10-CM | POA: Diagnosis not present

## 2017-01-07 LAB — CBC WITH DIFFERENTIAL/PLATELET
Basophils Absolute: 0 cells/uL (ref 0–200)
Basophils Relative: 0 %
Eosinophils Absolute: 192 cells/uL (ref 15–500)
Eosinophils Relative: 3 %
HCT: 41.8 % (ref 35.0–45.0)
Hemoglobin: 14.2 g/dL (ref 11.7–15.5)
Lymphocytes Relative: 22 %
Lymphs Abs: 1408 cells/uL (ref 850–3900)
MCH: 31.1 pg (ref 27.0–33.0)
MCHC: 34 g/dL (ref 32.0–36.0)
MCV: 91.7 fL (ref 80.0–100.0)
MPV: 10.9 fL (ref 7.5–12.5)
Monocytes Absolute: 512 cells/uL (ref 200–950)
Monocytes Relative: 8 %
Neutro Abs: 4288 cells/uL (ref 1500–7800)
Neutrophils Relative %: 67 %
Platelets: 230 10*3/uL (ref 140–400)
RBC: 4.56 MIL/uL (ref 3.80–5.10)
RDW: 13.4 % (ref 11.0–15.0)
WBC: 6.4 10*3/uL (ref 3.8–10.8)

## 2017-01-07 NOTE — Progress Notes (Signed)
Subjective:    Patient ID: Kimberly Cook, female    DOB: 07-05-1946, 71 y.o.   MRN: [id number]  HPI  Patient presents with several day hx/o profound fatigue, but adamantly denies any CP or discomfort. Says BP has been labile up & down and occas she feels hot and shaky, but denies any definite fever, rigors, dyspnea, sweats or chills. She has had daily HA's for the last several days. Denies any GI or GU sx's.  Has had some  Myalgias, but no arthralgias. Medication Sig  . aspirin 325 MG tablet Take 325 mg by mouth daily.  . ASTELIN nasal spray Place 2 sprays into both nostrils 2 (two) times daily. Use in each nostril as directed  . Biotin 1 MG CAPS Take 1 mg by mouth daily.   Marland Kitchen CALCIUM-VITAMIN D 600-400  Take 1 tablet by mouth daily.  . cephALEXin (KEFLEX) 500 MG capsule Take 1 capsule at bedtime for UTI prevention  . VITAMIN D 2000 UNITS  Take 6,000 Units by mouth daily.  Marland Kitchen COLACE 100 MG capsule Take 1capsule (100 mg total) by mouth 2 (two) times daily.   . folic acid  400 MCG tablet Take 400 mcg by mouth daily.  Marland Kitchen levothyroxine 112 MCG tab TAKE 1 TABLET EVERY DAY  . lisinopril  20 MG tablet TAKE 1 TABLET EVERY DAY FOR BLOOD PRESSURE  . MAGNESIUM  400 mg Take  by mouth daily. Reported on 12/20/2015  . methocarbamol  500 MG tab TAKE 1 TAB 3 x  DAILY AS NEEDED FOR MUSCLE SPASM(S)  . Omeprazole 40 MG capsule Take 1 capsule (40 mg total) by mouth daily.  . Simvastatin 40 MG tablet TAKE 1 TABLET EVERY EVENING  . acetaminophen  500 MG tablet Take 500 mg by mouth 3 (three) times daily.  Marland Kitchen amitriptyline 25 MG tablet TAKE 2 TABLETS AT BEDTIME  . ciprofloxacin 250 MG tablet Take 1 tablet 2 x / day with food for Infection  . meloxicam  7.5 MG tablet Take 1 tablet (7.5 mg total) by mouth daily.  . traZODone 150 MG tablet Take 1 tablet (150 mg total) by mouth at bedtime.   No Known Allergies   Past Medical History:  Diagnosis Date  . Allergy   . Arthritis   . Depression   . Essential  hypertension    Normal cardiolite 05/2006 EF 71%  . GERD (gastroesophageal reflux disease)   . Headache   . Hyperlipidemia   . Hypothyroidism   . Mini stroke (HCC) 2011   . Occipital neuralgia   . Prediabetes   . Urinary tract infection   . Vitamin D deficiency     Past Surgical History:  Procedure Laterality Date  . ABDOMINAL HYSTERECTOMY    . BLADDER SURGERY    . BREAST BIOPSY Right Over 20 years    Benign  . CHOLECYSTECTOMY    . gastroplication     . JOINT REPLACEMENT Left   . KNEE ARTHROSCOPY Left 2011  . TONSILLECTOMY AND ADENOIDECTOMY    . TOTAL KNEE ARTHROPLASTY Left 04/17/2015   Procedure: LEFT TOTAL KNEE ARTHROPLASTY;  Surgeon: Durene Romans, MD;  Location: [facility];  Service: Orthopedics;  Laterality: Left;   Review of Systems  10 point systems review negative except as above.    Objective:   Physical Exam  BP 112/82   P 96   T 97.5 F     16   Ht 5' 3.25"    Wt 174 lb 6.4 oz )  BMI 30.65    O2 sat 98%  HEENT - WNL. Neck - supple.  Chest - Clear equal BS. Cor - Nl HS. RRR w/o sig MGR. PP 1(+). No edema. MS- FROM w/o deformities.  Gait Nl. Neuro -  Nl w/o focal abnormalities. Skin clear w/o rash, cyanosis or icterus.   EKG - NSR and no acute changes.     Assessment & Plan:   1. Weakness  - CBC with Differential/Platelet - BASIC METABOLIC PANEL WITH GFR - Hepatic function panel - Magnesium - TSH - EKG 12-Lead - CK  2. Essential hypertension  - CBC with Differential/Platelet - BASIC METABOLIC PANEL WITH GFR - Magnesium - TSH - EKG 12-Lead  3. Hypothyroidism  - TSH  4. Muscle ache  - CK  5. Medication management  - CBC with Differential/Platelet - BASIC METABOLIC PANEL WITH GFR - Hepatic function panel - Magnesium - TSH  - will screen labs to r/o serious etiologies for her sx's.Otherwise advise to return if sx's return or worsen.

## 2017-01-08 LAB — BASIC METABOLIC PANEL WITH GFR
BUN: 18 mg/dL (ref 7–25)
CO2: 21 mmol/L (ref 20–31)
Calcium: 9.8 mg/dL (ref 8.6–10.4)
Chloride: 102 mmol/L (ref 98–110)
Creat: 1.18 mg/dL — ABNORMAL HIGH (ref 0.60–0.93)
GFR, Est African American: 53 mL/min — ABNORMAL LOW (ref >=60)
GFR, Est Non African American: 46 mL/min — ABNORMAL LOW (ref >=60)
Glucose, Bld: 81 mg/dL (ref 65–99)
Potassium: 4.3 mmol/L (ref 3.5–5.3)
Sodium: 139 mmol/L (ref 135–146)

## 2017-01-08 LAB — MAGNESIUM: Magnesium: 2.1 mg/dL (ref 1.5–2.5)

## 2017-01-08 LAB — TSH: TSH: 1.84 mIU/L

## 2017-01-08 LAB — HEPATIC FUNCTION PANEL
ALT: 19 U/L (ref 6–29)
AST: 22 U/L (ref 10–35)
Albumin: 4.1 g/dL (ref 3.6–5.1)
Alkaline Phosphatase: 98 U/L (ref 33–130)
Bilirubin, Direct: 0.1 mg/dL (ref <=0.2)
Indirect Bilirubin: 0.3 mg/dL (ref 0.2–1.2)
Total Bilirubin: 0.4 mg/dL (ref 0.2–1.2)
Total Protein: 7.1 g/dL (ref 6.1–8.1)

## 2017-01-08 LAB — CK: Total CK: 32 U/L (ref 29–143)

## 2017-01-23 ENCOUNTER — Other Ambulatory Visit: Payer: Self-pay | Admitting: Internal Medicine

## 2017-01-23 DIAGNOSIS — E039 Hypothyroidism, unspecified: Secondary | ICD-10-CM

## 2017-03-03 ENCOUNTER — Telehealth: Payer: Self-pay | Admitting: Cardiology

## 2017-03-03 DIAGNOSIS — K219 Gastro-esophageal reflux disease without esophagitis: Secondary | ICD-10-CM | POA: Diagnosis not present

## 2017-03-03 DIAGNOSIS — R42 Dizziness and giddiness: Secondary | ICD-10-CM | POA: Diagnosis not present

## 2017-03-03 DIAGNOSIS — I10 Essential (primary) hypertension: Secondary | ICD-10-CM | POA: Diagnosis not present

## 2017-03-03 DIAGNOSIS — M47816 Spondylosis without myelopathy or radiculopathy, lumbar region: Secondary | ICD-10-CM | POA: Diagnosis not present

## 2017-03-03 DIAGNOSIS — Z8673 Personal history of transient ischemic attack (TIA), and cerebral infarction without residual deficits: Secondary | ICD-10-CM | POA: Diagnosis not present

## 2017-03-03 DIAGNOSIS — I951 Orthostatic hypotension: Secondary | ICD-10-CM | POA: Diagnosis not present

## 2017-03-03 DIAGNOSIS — E785 Hyperlipidemia, unspecified: Secondary | ICD-10-CM | POA: Diagnosis not present

## 2017-03-03 DIAGNOSIS — N390 Urinary tract infection, site not specified: Secondary | ICD-10-CM | POA: Diagnosis not present

## 2017-03-03 DIAGNOSIS — M1990 Unspecified osteoarthritis, unspecified site: Secondary | ICD-10-CM | POA: Diagnosis not present

## 2017-03-03 NOTE — Telephone Encounter (Signed)
Patient would like to switch from Dr. Diona Browner to Dr. Eldridge Dace. Patient states [facility] is closer.

## 2017-03-03 NOTE — Telephone Encounter (Signed)
I would not stand in the way of her making this switch if it is more convenient. Actually, I have never seen her in the office, saw her in consultation in the hospital back in May 2017.

## 2017-03-04 ENCOUNTER — Emergency Department (HOSPITAL_COMMUNITY): Payer: Medicare HMO

## 2017-03-04 ENCOUNTER — Emergency Department (HOSPITAL_COMMUNITY)
Admission: EM | Admit: 2017-03-04 | Discharge: 2017-03-05 | Disposition: A | Discharge status: Home / Self Care | Payer: Medicare HMO | Attending: Emergency Medicine | Admitting: Emergency Medicine

## 2017-03-04 DIAGNOSIS — R42 Dizziness and giddiness: Secondary | ICD-10-CM | POA: Diagnosis not present

## 2017-03-04 DIAGNOSIS — Z79899 Other long term (current) drug therapy: Secondary | ICD-10-CM | POA: Insufficient documentation

## 2017-03-04 DIAGNOSIS — N3001 Acute cystitis with hematuria: Secondary | ICD-10-CM | POA: Diagnosis not present

## 2017-03-04 DIAGNOSIS — N390 Urinary tract infection, site not specified: Secondary | ICD-10-CM | POA: Diagnosis not present

## 2017-03-04 DIAGNOSIS — I959 Hypotension, unspecified: Secondary | ICD-10-CM | POA: Diagnosis not present

## 2017-03-04 DIAGNOSIS — R079 Chest pain, unspecified: Secondary | ICD-10-CM | POA: Diagnosis not present

## 2017-03-04 DIAGNOSIS — R0602 Shortness of breath: Secondary | ICD-10-CM | POA: Diagnosis not present

## 2017-03-04 DIAGNOSIS — E039 Hypothyroidism, unspecified: Secondary | ICD-10-CM | POA: Insufficient documentation

## 2017-03-04 DIAGNOSIS — Z7982 Long term (current) use of aspirin: Secondary | ICD-10-CM | POA: Insufficient documentation

## 2017-03-04 DIAGNOSIS — I10 Essential (primary) hypertension: Secondary | ICD-10-CM | POA: Diagnosis not present

## 2017-03-04 LAB — CBC
HCT: 40.4 % (ref 36.0–46.0)
Hemoglobin: 14 g/dL (ref 12.0–15.0)
MCH: 31.1 pg (ref 26.0–34.0)
MCHC: 34.7 g/dL (ref 30.0–36.0)
MCV: 89.8 fL (ref 78.0–100.0)
Platelets: 215 10*3/uL (ref 150–400)
RBC: 4.5 MIL/uL (ref 3.87–5.11)
RDW: 12.7 % (ref 11.5–15.5)
WBC: 5.2 10*3/uL (ref 4.0–10.5)

## 2017-03-04 LAB — I-STAT TROPONIN, ED: Troponin i, poc: 0 ng/mL (ref 0.00–0.08)

## 2017-03-04 LAB — BASIC METABOLIC PANEL
Anion gap: 11 (ref 5–15)
BUN: 21 mg/dL — ABNORMAL HIGH (ref 6–20)
CO2: 21 mmol/L — ABNORMAL LOW (ref 22–32)
Calcium: 9.7 mg/dL (ref 8.9–10.3)
Chloride: 104 mmol/L (ref 101–111)
Creatinine, Ser: 1.43 mg/dL — ABNORMAL HIGH (ref 0.44–1.00)
GFR calc Af Amer: 41 mL/min — ABNORMAL LOW (ref >60)
GFR calc non Af Amer: 36 mL/min — ABNORMAL LOW (ref >60)
Glucose, Bld: 97 mg/dL (ref 65–99)
Potassium: 3.8 mmol/L (ref 3.5–5.1)
Sodium: 136 mmol/L (ref 135–145)

## 2017-03-04 NOTE — ED Triage Notes (Signed)
Pt c/o central/left chest pain and SOB can only describe as a funny feeling for a week.  SOB intermittently for 2 months.  Was seen by [facility] today and directed to come to the ED.

## 2017-03-04 NOTE — Telephone Encounter (Signed)
OK.  I don't think I have ever seen her.  Looks like she saw Dr. Tenny Craw in the past.

## 2017-03-04 NOTE — ED Notes (Signed)
Patient updated on delays 

## 2017-03-05 MED ORDER — CEPHALEXIN 250 MG PO CAPS
500.0000 mg | ORAL_CAPSULE | Freq: Once | ORAL | Status: AC
  Administered 2017-03-05: 500 mg via ORAL
  Filled 2017-03-05: qty 2

## 2017-03-05 MED ORDER — CEPHALEXIN 500 MG PO CAPS: 500.0000 mg | ORAL_CAPSULE | Freq: Four times a day (QID) | ORAL | 0 refills | Status: DC

## 2017-03-05 NOTE — ED Notes (Signed)
Pt ambulated down hall with Sp02. Pt ambulated with standby assist only. Sp02 maintained above 96% on room air while ambulating. Pt's RR did increase to 35 while ambulating and HR increase to 115-120 beats per minute. HR returned to normal upon returning to the bed. RN notified.

## 2017-03-05 NOTE — ED Provider Notes (Addendum)
[facility]-EMERGENCY DEPT Provider Note   CSN: [id number] Arrival date & time: 03/04/17  1717     History   Chief Complaint Chief Complaint  Patient presents with  . Chest Pain  . Shortness of Breath    HPI Kimberly Cook is a 71 y.o. female.  Patient referred in to the emergency department from primary care office for orthostatic hypotension and dizziness. Patient with a complaint of dizziness on and off lightheadedness shortness of breath and some discomfort in the right lower back area for about 2 months. Patient has a history of hypertension. Is on hypertensive meds. Recently blood pressure medicine was dropped just to lisinopril 10 mg once a day. Patient has been getting low blood pressures at home. Patient denies any chest pain. Had a feeling of near syncope today. But did not have a syncopal episode.      Past Medical History:  Diagnosis Date  . Allergy   . Arthritis   . Depression   . Essential hypertension    Normal cardiolite 05/2006 EF 71%  . GERD (gastroesophageal reflux disease)   . Headache   . Hyperlipidemia   . Hypothyroidism   . Mini stroke (HCC) 2011   . Occipital neuralgia   . Prediabetes   . Urinary tract infection   . Vitamin D deficiency     Patient Active Problem List   Diagnosis Date Noted  . Essential hypertension   . S/P TKR (total knee replacement) 04/17/2015  . Morbid obesity (HCC) 09/21/2014  . Medication management 09/21/2014  . Hypothyroidism 02/17/2014  . Hyperlipidemia   . GERD (gastroesophageal reflux disease)   . Vitamin D deficiency   . Depression   . Prediabetes     Past Surgical History:  Procedure Laterality Date  . ABDOMINAL HYSTERECTOMY    . BLADDER SURGERY    . BREAST BIOPSY Right Over 20 years    Benign  . CHOLECYSTECTOMY    . gastroplication     . JOINT REPLACEMENT Left   . KNEE ARTHROSCOPY Left 2011  . TONSILLECTOMY AND ADENOIDECTOMY    . TOTAL KNEE ARTHROPLASTY Left 04/17/2015   Procedure: LEFT TOTAL KNEE  ARTHROPLASTY;  Surgeon: Durene Romans, MD;  Location: [facility];  Service: Orthopedics;  Laterality: Left;    OB History    No data available       Home Medications    Prior to Admission medications   Medication Sig Start Date End Date Taking? Authorizing Provider  aspirin 325 MG tablet Take 325 mg by mouth daily.   Yes [provider]  Biotin 1 MG CAPS Take 1 mg by mouth daily.    Yes [provider]  Cholecalciferol (VITAMIN D3) 5000 units TABS Take 5,000 Units by mouth daily.   Yes [provider]  folic acid (FOLVITE) 400 MCG tablet Take 400 mcg by mouth daily.   Yes [provider]  levothyroxine (SYNTHROID, LEVOTHROID) 112 MCG tablet TAKE 1 TABLET EVERY DAY 01/23/17  Yes Quentin Mulling, PA-C  lisinopril (PRINIVIL,ZESTRIL) 20 MG tablet TAKE 1 TABLET EVERY DAY FOR BLOOD PRESSURE Patient taking differently: TAKE 1/2 TABLET EVERY DAY FOR BLOOD PRESSURE 04/01/16  Yes Quentin Mulling, PA-C  magnesium oxide (MAG-OX) 400 MG tablet Take 400 mg by mouth daily.   Yes [provider]  omeprazole (PRILOSEC) 40 MG capsule Take 1 capsule (40 mg total) by mouth daily. 12/16/16  Yes Lucky Cowboy, MD  simvastatin (ZOCOR) 40 MG tablet TAKE 1 TABLET EVERY EVENING 01/23/17  Yes  Quentin Mulling, PA-C  azelastine (ASTELIN) 0.1 % nasal spray Place 2 sprays into both nostrils 2 (two) times daily. Use in each nostril as directed Patient not taking: Reported on 03/04/2017 06/17/16 06/17/17  Forcucci, Toni Amend, PA-C  cephALEXin (KEFLEX) 500 MG capsule Take 1 capsule at bedtime for UTI prevention Patient not taking: Reported on 03/04/2017 12/22/16 06/24/17  Lucky Cowboy, MD  cephALEXin (KEFLEX) 500 MG capsule Take 1 capsule (500 mg total) by mouth 4 (four) times daily. 03/05/17   Vanetta Mulders, MD  docusate sodium (COLACE) 100 MG capsule Take 1 capsule (100 mg total) by mouth 2 (two) times daily. Patient not taking: Reported on 03/04/2017 04/19/15   Lanney Gins,  PA-C  methocarbamol (ROBAXIN) 500 MG tablet TAKE 1 TABLET THREE TIMES DAILY AS NEEDED FOR MUSCLE SPASM(S) Patient not taking: Reported on 03/04/2017 09/22/16   Lucky Cowboy, MD    Family History Family History  Problem Relation Age of Onset  . Heart disease Mother   . Hypertension Mother   . Diabetes Mother   . Heart disease Father   . Breast cancer Maternal Aunt     Social History Social History  Substance Use Topics  . Smoking status: Never Smoker  . Smokeless tobacco: Never Used  . Alcohol use No     Allergies   Patient has no known allergies.   Review of Systems Review of Systems  Constitutional: Negative for fever.  HENT: Negative for congestion.   Eyes: Negative for redness.  Respiratory: Positive for shortness of breath.   Cardiovascular: Negative for chest pain.  Gastrointestinal: Negative for abdominal pain.  Genitourinary: Positive for flank pain.  Musculoskeletal: Negative for back pain.  Skin: Negative for rash.  Neurological: Positive for dizziness and light-headedness. Negative for syncope.  Hematological: Does not bruise/bleed easily.  Psychiatric/Behavioral: Negative for confusion.     Physical Exam Updated Vital Signs BP (!) 131/94   Pulse 85   Temp 98.1 F (36.7 C) (Oral)   Resp 14   Ht 1.6 m (5\' 3" )   Wt 79.4 kg (175 lb)   SpO2 98%   BMI 31.00 kg/m   Physical Exam  Constitutional: She is oriented to person, place, and time. She appears well-developed and well-nourished. No distress.  HENT:  Head: Normocephalic and atraumatic.  Mouth/Throat: Oropharynx is clear and moist.  Eyes: Pupils are equal, round, and reactive to light. EOM are normal.  Neck: Normal range of motion. Neck supple.  Cardiovascular: Normal rate and regular rhythm.   Pulmonary/Chest: Breath sounds normal. She is in respiratory distress.  Abdominal: Soft. Bowel sounds are normal. There is no tenderness.  Musculoskeletal: Normal range of motion. She exhibits no  edema.  Neurological: She is alert and oriented to person, place, and time. No cranial nerve deficit or sensory deficit. She exhibits normal muscle tone. Coordination normal.  Skin: Skin is warm.  Nursing note and vitals reviewed.    ED Treatments / Results  Labs (all labs ordered are listed, but only abnormal results are displayed) Labs Reviewed  BASIC METABOLIC PANEL - Abnormal; Notable for the following:       Result Value   CO2 21 (*)    BUN 21 (*)    Creatinine, Ser 1.43 (*)    GFR calc non Af Amer 36 (*)    GFR calc Af Amer 41 (*)    All other components within normal limits  CBC  I-STAT TROPONIN, ED    EKG  EKG Interpretation  Date/Time:  Tuesday  March 04 2017 17:22:11 EDT Ventricular Rate:  83 PR Interval:  152 QRS Duration: 74 QT Interval:  390 QTC Calculation: 458 R Axis:   -2 Text Interpretation:  Sinus rhythm with Premature supraventricular complexes Cannot rule out Anterior infarct , age undetermined Abnormal ECG Confirmed by Vanetta Mulders [phone]) on 03/04/2017 10:55:20 PM       Radiology Dg Chest 2 View  Result Date: 03/04/2017 CLINICAL DATA:  Chest pain EXAM: CHEST  2 VIEW COMPARISON:  12/13/2015 FINDINGS: Heart size and vascularity normal. Hiatal hernia unchanged. Lungs are clear without infiltrate effusion or mass. IMPRESSION: No active cardiopulmonary disease.  Hiatal hernia Electronically Signed   By: Marlan Palau M.D.   On: 03/04/2017 18:37    Procedures Procedures (including critical care time)  Medications Ordered in ED Medications  cephALEXin (KEFLEX) capsule 500 mg (not administered)     Initial Impression / Assessment and Plan / ED Course  I have reviewed the triage vital signs and the nursing notes.  Pertinent labs & imaging results that were available during my care of the patient were reviewed by me and considered in my medical decision making (see chart for details).     Patient's blood pressures here done by me and left and  right arm. Were getting systolic pressures around 150-140. Diastolic pressure is around 90. Nurse did orthostatic blood pressures when patient arrived no significant change. Patient did experience a little bit of lightheadedness. Patient's dizziness been present for 2 months may very well warrant additional workup but not acute to be worked up tonight can be done by primary care doctor. Have recommended patient keeping a blood pressure log. Patient's blood pressure has been reduced in the last few weeks to just lisinopril once a day at 10 mg. Recommending a log in the morning this patient at home is getting blood pressure readings low in the morning. And also recommend checking and in the afternoon as well. Patient chest x-ray negative here labs without any significant abnormalities troponin was also negative. Patient stable for discharge home and keeping a blood pressure log and close follow-up with primary care doctor. Additional workup for the dizziness may be warranted.  Final Clinical Impressions(s) / ED Diagnoses   Final diagnoses:  SOB (shortness of breath)  Dizziness  Acute cystitis with hematuria    New Prescriptions New Prescriptions   CEPHALEXIN (KEFLEX) 500 MG CAPSULE    Take 1 capsule (500 mg total) by mouth 4 (four) times daily.     Vanetta Mulders, MD 03/05/17 [phone]   Addendum: Patient's labs from the office were consistent with urinary tract infection. Patient's had frequent recurrent urinary tract infections. Will go ahead and start patient on Keflex.   Vanetta Mulders, MD 03/05/17 [phone]

## 2017-03-05 NOTE — Discharge Instructions (Signed)
Workup here today without any specific findings. Blood pressure has now normalized. Chest x-ray negative. Labs without significant abnormalities. Clearly there is evidence of a urinary tract infection based on your primary care office labs. Take the Keflex as directed. Make an appointment to follow-up with your primary care doctor. Return for any new or worse symptoms. Recommend keeping a daily blood pressure guide would recommend checking her blood pressure once in the morning and once in the evening and recorded each day.

## 2017-03-07 ENCOUNTER — Encounter: Payer: Self-pay | Admitting: Internal Medicine

## 2017-03-07 ENCOUNTER — Ambulatory Visit (INDEPENDENT_AMBULATORY_CARE_PROVIDER_SITE_OTHER): Payer: Medicare HMO | Admitting: Internal Medicine

## 2017-03-07 VITALS — BP 112/80 | HR 82 | Ht 63.0 in | Wt 175.4 lb

## 2017-03-07 DIAGNOSIS — R55 Syncope and collapse: Secondary | ICD-10-CM | POA: Diagnosis not present

## 2017-03-07 DIAGNOSIS — R0989 Other specified symptoms and signs involving the circulatory and respiratory systems: Secondary | ICD-10-CM

## 2017-03-07 DIAGNOSIS — R002 Palpitations: Secondary | ICD-10-CM | POA: Diagnosis not present

## 2017-03-07 DIAGNOSIS — R42 Dizziness and giddiness: Secondary | ICD-10-CM

## 2017-03-07 DIAGNOSIS — R0602 Shortness of breath: Secondary | ICD-10-CM | POA: Diagnosis not present

## 2017-03-07 DIAGNOSIS — I10 Essential (primary) hypertension: Secondary | ICD-10-CM | POA: Diagnosis not present

## 2017-03-07 NOTE — Patient Instructions (Signed)
Medication Instructions:  Your physician recommends that you continue on your current medications as directed. Please refer to the Current Medication list given to you today.    Labwork: None   Testing/Procedures: Your physician has requested that you have an echocardiogram. Echocardiography is a painless test that uses sound waves to create images of your heart. It provides your doctor with information about the size and shape of your heart and how well your heart's chambers and valves are working. This procedure takes approximately one hour. There are no restrictions for this procedure.  Your physician has recommended that you wear a holter monitor. Holter monitors are medical devices that record the heart's electrical activity. Doctors most often use these monitors to diagnose arrhythmias. Arrhythmias are problems with the speed or rhythm of the heartbeat. The monitor is a small, portable device. You can wear one while you do your normal daily activities. This is usually used to diagnose what is causing palpitations/syncope (passing out).  48 HOUR  Follow-Up: Your physician recommends that you schedule a follow-up appointment in: 4 weeks with Dr End.  Any Other Special Instructions Will Be Listed Below (If Applicable).     If you need a refill on your cardiac medications before your next appointment, please call your pharmacy. Marland Kitchen

## 2017-03-07 NOTE — Progress Notes (Signed)
New Outpatient Visit Date: 03/07/2017  Referring Provider: Lorenda Ishihara, MD [address] [place], [place] [address]  Chief Complaint: Lightheadedness, shortness of breath and labile blood pressure  HPI:  Ms. Fingerhut is a 71 y.o. female who is being seen today for the evaluation of dizzinessat the request of Dr. Chales Salmon. She has a history of hypertension, hyperlipidemia, "mini strokes" GERD, depression, and anxiety. For the last 2 months, Ms. Blechman has experienced intermittent lightheadedness with the feeling as though she may pass out. She has never completely passed out or fallen. This is often accompanied by a feeling of warmth and diaphoresis. She has checked her blood pressure during some of these episodes and has noted it to be as low as 67/52. She frequently feels lightheaded when standing up. She also has noted intermittent shortness of breath with mild activity. Even talking for extended periods seems to make her short of breath. She has not had any chest pain. She has experienced intermittent palpitations in the past but is unsure if there are any palpitations associated with her episodes of lightheadedness. She denies orthopnea and PND. She has occasional dependent edema and notes that at times her toes appear to be purple. She does not have claudication.  Ms. Holzmann was hospitalized at [facility] last May (2017) with chest pain radiating to the left arm. Echocardiogram and myocardial perfusion stress test at that time for unremarkable. She was recently seen by Dr. Chales Salmon, who was concerned that Ms. Okazaki lightheadedness and labile blood pressure could be related to ischemic heart disease.  --------------------------------------------------------------------------------------------------  Cardiovascular History & Procedures: Cardiovascular Problems:  Orthostatic lightheadedness and labile blood pressure  Dyspnea on exertion  Risk  Factors:  Hypertension, hyperlipidemia, family history, sedentary lifestyle, and age greater than 56  Cath/PCI:  None  CV Surgery:  None  EP Procedures and Devices:  None  Non-Invasive Evaluation(s):  Pharmacologic MPI (12/14/15): Low risk without ischemia or scar. LVEF 68%.  TTE (12/14/15): Normal LV size and wall thickness. LVEF 55-60%. Normal RV size and function. No significant valvular abnormalities.  Recent CV Pertinent Labs: Lab Results  Component Value Date   CHOL 127 10/08/2016   HDL 62 10/08/2016   LDLCALC 46 10/08/2016   TRIG 93 10/08/2016   CHOLHDL 2.0 10/08/2016   INR 1.06 04/06/2015   K 3.8 03/04/2017   MG 2.1 01/07/2017   BUN 21 (H) 03/04/2017   CREATININE 1.43 (H) 03/04/2017   CREATININE 1.18 (H) 01/07/2017    --------------------------------------------------------------------------------------------------  Past Medical History:  Diagnosis Date  . Allergy   . Arthritis   . Depression   . Essential hypertension    Normal cardiolite 05/2006 EF 71%  . GERD (gastroesophageal reflux disease)   . Headache   . Hyperlipidemia   . Hypothyroidism   . Mini stroke (HCC) 2011   . Occipital neuralgia   . Prediabetes   . Urinary tract infection   . Vitamin D deficiency     Past Surgical History:  Procedure Laterality Date  . ABDOMINAL HYSTERECTOMY    . BLADDER SURGERY    . BREAST BIOPSY Right Over 20 years    Benign  . CHOLECYSTECTOMY    . gastroplication     . JOINT REPLACEMENT Left   . KNEE ARTHROSCOPY Left 2011  . TONSILLECTOMY AND ADENOIDECTOMY    . TOTAL KNEE ARTHROPLASTY Left 04/17/2015   Procedure: LEFT TOTAL KNEE ARTHROPLASTY;  Surgeon: Durene Romans, MD;  Location: [facility];  Service: Orthopedics;  Laterality:  Left;    Current Meds  Medication Sig  . amitriptyline (ELAVIL) 25 MG tablet Take 25 mg by mouth daily.  Marland Kitchen aspirin 325 MG tablet Take 325 mg by mouth daily.  . Biotin 1 MG CAPS Take 1 mg by mouth daily.   . Cholecalciferol  (VITAMIN D3) 5000 units TABS Take 5,000 Units by mouth daily.  Marland Kitchen docusate sodium (COLACE) 100 MG capsule Take 1 capsule (100 mg total) by mouth 2 (two) times daily.  . folic acid (FOLVITE) 400 MCG tablet Take 400 mcg by mouth daily.  Marland Kitchen levothyroxine (SYNTHROID, LEVOTHROID) 112 MCG tablet TAKE 1 TABLET EVERY DAY  . losartan (COZAAR) 25 MG tablet Take 25 mg by mouth daily.  . magnesium oxide (MAG-OX) 400 MG tablet Take 400 mg by mouth daily.  . methocarbamol (ROBAXIN) 500 MG tablet TAKE 1 TABLET THREE TIMES DAILY AS NEEDED FOR MUSCLE SPASM(S)  . omeprazole (PRILOSEC) 40 MG capsule Take 1 capsule (40 mg total) by mouth daily.  . [DISCONTINUED] azelastine (ASTELIN) 0.1 % nasal spray Place 2 sprays into both nostrils 2 (two) times daily. Use in each nostril as directed    Allergies: Patient has no known allergies.  Social History   Social History  . Marital status: Widowed    Spouse name: N/A  . Number of children: N/A  . Years of education: N/A   Occupational History  . Not on file.   Social History Main Topics  . Smoking status: Never Smoker  . Smokeless tobacco: Never Used  . Alcohol use No  . Drug use: No  . Sexual activity: No   Other Topics Concern  . Not on file   Social History Narrative  . No narrative on file    Family History  Problem Relation Age of Onset  . Heart disease Mother   . Hypertension Mother   . Diabetes Mother   . Heart attack Mother 34  . Heart disease Father   . Heart attack Father 26  . Breast cancer Maternal Aunt   . Heart attack Brother     Review of Systems: Ms. Shackleford notes chronic intermittent back pain. She also has recurrent UTIs and is currently receiving cephalexin. Otherwise, 12-system review of systems was performed and was negative except as noted in the HPI.  --------------------------------------------------------------------------------------------------  Physical Exam: BP 112/80   Pulse 82   Ht 5\' 3"  (1.6 m)   Wt 175  lb 6.4 oz (79.6 kg)   SpO2 99%   BMI 31.07 kg/m   General:  Obese woman, seated comfortably in the exam room. HEENT: No conjunctival pallor or scleral icterus. Moist mucous membranes. OP clear. Neck: Supple without lymphadenopathy, thyromegaly, JVD, or HJR. No carotid bruit. Lungs: Normal work of breathing. Clear to auscultation bilaterally without wheezes or crackles. Heart: Regular rate and rhythm without murmurs, rubs, or gallops. Non-displaced PMI. Abd: Bowel sounds present. Soft, NT/ND without hepatosplenomegaly Ext: No lower extremity edema. Radial, PT, and DP pulses are 2+ bilaterally Skin: Warm and dry without rash. Neuro: CNIII-XII intact. Strength and fine-touch sensation intact in upper and lower extremities bilaterally. Psych: Normal mood and affect.  EKG:  Normal sinus rhythm without abnormalities.  Lab Results  Component Value Date   WBC 5.2 03/04/2017   HGB 14.0 03/04/2017   HCT 40.4 03/04/2017   MCV 89.8 03/04/2017   PLT 215 03/04/2017    Lab Results  Component Value Date   NA 136 03/04/2017   K 3.8 03/04/2017   CL 104  03/04/2017   CO2 21 (L) 03/04/2017   BUN 21 (H) 03/04/2017   CREATININE 1.43 (H) 03/04/2017   GLUCOSE 97 03/04/2017   ALT 19 01/07/2017    Lab Results  Component Value Date   CHOL 127 10/08/2016   HDL 62 10/08/2016   LDLCALC 46 10/08/2016   TRIG 93 10/08/2016   CHOLHDL 2.0 10/08/2016   Outside labs (03/03/17): CBC: WBC 5.0, HGB 14.2, HCT 40.7, platelet 195  CMP: Sodium 137, potassium 4.2, chloride 102, CO2 24, BUN 121, creatinine 1.1, calcium 10.2, AST 17, ALT 19, alkaline phosphatase 92, total bilirubin 0.6, total protein 7.3, albumin 4.4  Lipid panel: Total cholesterol 227, triglycerides 188, HDL 52, LDL 138  --------------------------------------------------------------------------------------------------  ASSESSMENT AND PLAN: Orthostatic lightheadedness, near syncope, and labile blood pressure Over the last 2 months, Ms.  Fritcher has noted frequent orthostatic lightheadedness and fluctuations in her blood pressure with drops as low as 67/52, per her report. She is normotensive today without orthostatic hypotension. Previous cardiac workup last year, including pharmacologic myocardial perfusion stress test and transthoracic echocardiogram, were normal. Her symptoms may be related to dehydration or autonomic dysfunction. I encouraged her to stay well-hydrated and to liberalize her salt intake somewhat. We will repeat a transthoracic echocardiogram to evaluate for interval development of structural heart disease. I will also have Ms. Westgate wear a 48-hour Holter monitor to assess for an arrhythmogenic cause of her frequent lightheadedness. Given lack of chest pain and low risk stress test a year ago, we have agreed to defer ischemia evaluation at this time.  Palpitations This is nonspecific and does not clearly correspond to episodes of lightheadedness and hypotension. We have agreed to obtain a 48-hour Holter monitor to exclude arrhythmia contributing to Ms. Abramo' symptoms. Recent TSH is not available; I recommend follow-up if this has not been checked recently.  Shortness of breath This is present with minimal exertion and even just talking for extended periods. However, exam today unrevealing with an oxygen saturation of 99% on room air. As above, we will repeat an echocardiogram to exclude new cardiomyopathy or other structural abnormality. If this is normal, further pulmonary evaluation should be considered.  Hypertension Blood pressure is normal today. I will not make any medication changes at this time.  Follow-up: Return to clinic in 1 month.  Yvonne Kendall, MD 03/08/2017 5:19 PM

## 2017-03-08 ENCOUNTER — Encounter: Payer: Self-pay | Admitting: Internal Medicine

## 2017-03-08 DIAGNOSIS — R002 Palpitations: Secondary | ICD-10-CM | POA: Insufficient documentation

## 2017-03-08 DIAGNOSIS — R42 Dizziness and giddiness: Secondary | ICD-10-CM | POA: Insufficient documentation

## 2017-03-08 DIAGNOSIS — R55 Syncope and collapse: Secondary | ICD-10-CM | POA: Insufficient documentation

## 2017-03-08 DIAGNOSIS — R0989 Other specified symptoms and signs involving the circulatory and respiratory systems: Secondary | ICD-10-CM | POA: Insufficient documentation

## 2017-03-18 DIAGNOSIS — F5101 Primary insomnia: Secondary | ICD-10-CM | POA: Diagnosis not present

## 2017-03-18 DIAGNOSIS — N390 Urinary tract infection, site not specified: Secondary | ICD-10-CM | POA: Diagnosis not present

## 2017-03-18 DIAGNOSIS — R55 Syncope and collapse: Secondary | ICD-10-CM | POA: Diagnosis not present

## 2017-03-18 DIAGNOSIS — I10 Essential (primary) hypertension: Secondary | ICD-10-CM | POA: Diagnosis not present

## 2017-03-18 DIAGNOSIS — E785 Hyperlipidemia, unspecified: Secondary | ICD-10-CM | POA: Diagnosis not present

## 2017-03-20 ENCOUNTER — Ambulatory Visit (INDEPENDENT_AMBULATORY_CARE_PROVIDER_SITE_OTHER): Payer: Medicare HMO

## 2017-03-20 ENCOUNTER — Other Ambulatory Visit: Payer: Self-pay

## 2017-03-20 ENCOUNTER — Ambulatory Visit (HOSPITAL_COMMUNITY): Payer: Medicare HMO | Attending: Cardiology

## 2017-03-20 DIAGNOSIS — I517 Cardiomegaly: Secondary | ICD-10-CM | POA: Insufficient documentation

## 2017-03-20 DIAGNOSIS — R0989 Other specified symptoms and signs involving the circulatory and respiratory systems: Secondary | ICD-10-CM | POA: Insufficient documentation

## 2017-03-20 DIAGNOSIS — R002 Palpitations: Secondary | ICD-10-CM | POA: Diagnosis not present

## 2017-03-20 DIAGNOSIS — R55 Syncope and collapse: Secondary | ICD-10-CM

## 2017-03-24 ENCOUNTER — Encounter: Payer: Self-pay | Admitting: *Deleted

## 2017-03-24 DIAGNOSIS — R002 Palpitations: Secondary | ICD-10-CM | POA: Diagnosis not present

## 2017-03-24 DIAGNOSIS — R55 Syncope and collapse: Secondary | ICD-10-CM | POA: Diagnosis not present

## 2017-03-24 NOTE — Progress Notes (Signed)
Patient ID: Kimberly Cook, female   DOB: 1945/10/11, 71 y.o.   MRN: [id number] Patient walked in today.  She had a 48 hour holter monitor applied 03/20/17.  Her air conditioning went out on same day causing excessive perspiration and her monitor came loose.  I applied a new 48 hour holter monitor to the patient and gave her extra electrodes in case one came loose.  [employer] will be contacted with new monitor serial number.

## 2017-04-05 DIAGNOSIS — I2510 Atherosclerotic heart disease of native coronary artery without angina pectoris: Secondary | ICD-10-CM

## 2017-04-05 HISTORY — DX: Atherosclerotic heart disease of native coronary artery without angina pectoris: I25.10

## 2017-04-08 DIAGNOSIS — Z1231 Encounter for screening mammogram for malignant neoplasm of breast: Secondary | ICD-10-CM | POA: Diagnosis not present

## 2017-04-08 LAB — HM MAMMOGRAPHY

## 2017-04-11 ENCOUNTER — Ambulatory Visit (INDEPENDENT_AMBULATORY_CARE_PROVIDER_SITE_OTHER): Payer: Medicare HMO | Admitting: Internal Medicine

## 2017-04-11 ENCOUNTER — Encounter: Payer: Self-pay | Admitting: Internal Medicine

## 2017-04-11 VITALS — BP 140/92 | HR 96 | Ht 63.0 in | Wt 174.0 lb

## 2017-04-11 DIAGNOSIS — R55 Syncope and collapse: Secondary | ICD-10-CM

## 2017-04-11 DIAGNOSIS — I10 Essential (primary) hypertension: Secondary | ICD-10-CM | POA: Diagnosis not present

## 2017-04-11 DIAGNOSIS — R0602 Shortness of breath: Secondary | ICD-10-CM | POA: Diagnosis not present

## 2017-04-11 DIAGNOSIS — M8589 Other specified disorders of bone density and structure, multiple sites: Secondary | ICD-10-CM | POA: Diagnosis not present

## 2017-04-11 MED ORDER — METOPROLOL SUCCINATE ER 25 MG PO TB24: 12.5000 mg | ORAL_TABLET | Freq: Every day | ORAL | 3 refills | Status: DC

## 2017-04-11 MED ORDER — METOPROLOL SUCCINATE ER 25 MG PO TB24
12.5000 mg | ORAL_TABLET | Freq: Every day | ORAL | 3 refills | Status: DC
Start: 2017-04-11 — End: 2017-06-02

## 2017-04-11 NOTE — Patient Instructions (Addendum)
Medication Instructions:  Your physician has recommended you make the following change in your medication:   START metoprolol succinate (Toprol-XL) 12.5 mg (1/2 tablet) once daily.   Labwork: BMET TODAY  Testing/Procedures: Your physician wants you to have a cardiac CT.  Your physician has recommended that you wear an event monitor. Event monitors are medical devices that record the heart's electrical activity. Doctors most often [place] these monitors to diagnose arrhythmias. Arrhythmias are problems with the speed or rhythm of the heartbeat. The monitor is a small, portable device. You can wear one while you do your normal daily activities. This is usually used to diagnose what is causing palpitations/syncope (passing out).    Follow-Up: Your physician wants you to follow-up in: 6 weeks with Dr. Okey Dupre.   Any Other Special Instructions Will Be Listed Below (If Applicable).     If you need a refill on your cardiac medications before your next appointment, please call your pharmacy.

## 2017-04-11 NOTE — Progress Notes (Signed)
Follow-up Outpatient Visit Date: 04/11/2017  Primary Care Provider: Lorenda Ishihara, MD [address] [place] [address]  Chief Complaint: Shortness of breath and lightheadedness  HPI:  Kimberly Cook is a 71 y.o. year-old female with history of hypertension, hyperlipidemia, "mini strokes," GERD, depression, and anxiety, who presents for follow-up of shortness of breath and lightheadedness. I last saw her on 03/07/17. The time, she reported orthostatic lightheadedness and labile blood pressures with near syncope. Since her last visit, she has continued to need to have fluctuating blood pressures and some lightheadedness. She has not passed out. She has not had any chest pain but notes frequent episodes of shortness of breath, sometimes even with light activity around the house or when talking on the phone. She denies orthopnea, PND, and edema.  Following her last visit, transthoracic echocardiogram was performed demonstrating focal basal hypertrophy and diastolic dysfunction. 48 hour event monitor showed occasional PACs and rare PVCs, as well as a brief episode of nonconducted P waves that most likely represent blocked PACs and less likely 2:1 AV block. Kimberly Cook has experienced occasional brief episodes of fluttering in the chest, but notes that she did not feel this while wearing the monitor. She also did not have any lightheadedness during the monitoring period.  Since her last visit, Kimberly Cook has also developed pain in both lower extremities extending from the balls of her feet all the way up her calves. She describes it as an ache that can be present with walking or even when lying in bed. Sometimes light touch is painful. The discomfort as a sharp, shooting pain.  --------------------------------------------------------------------------------------------------  Cardiovascular History & Procedures: Cardiovascular Problems:  Orthostatic lightheadedness and labile  blood pressure  Dyspnea on exertion  Risk Factors:  Hypertension, hyperlipidemia, family history, sedentary lifestyle, and age greater than 77  Cath/PCI:  None  CV Surgery:  None  EP Procedures and Devices:  48 hour Holter monitor (03/20/17): Predominantly sinus rhythm with occasional PACs and rare PVCs. Brief episode of blocked PACs versus 2:1 AV block.  Non-Invasive Evaluation(s):  TTE (03/20/17): Normal LV size with mild focal basal hypertrophy of the septum. LVEF 50-55% with normal wall motion. Grade 1 diastolic dysfunction. Mitral annular calcification present. Normal RV size and function. Normal pulmonary artery pressure.  Pharmacologic MPI (12/14/15): Low risk without ischemia or scar. LVEF 68%.  TTE (12/14/15): Normal LV size and wall thickness. LVEF 55-60%. Normal RV size and function. No significant valvular abnormalities.   Recent CV Pertinent Labs: Lab Results  Component Value Date   CHOL 127 10/08/2016   HDL 62 10/08/2016   LDLCALC 46 10/08/2016   TRIG 93 10/08/2016   CHOLHDL 2.0 10/08/2016   INR 1.06 04/06/2015   K 3.8 03/04/2017   MG 2.1 01/07/2017   BUN 21 (H) 03/04/2017   CREATININE 1.43 (H) 03/04/2017   CREATININE 1.18 (H) 01/07/2017    Past medical and surgical history were reviewed and updated in EPIC.  Current Meds  Medication Sig  . amitriptyline (ELAVIL) 25 MG tablet Take 25 mg by mouth daily.  Marland Kitchen aspirin 325 MG tablet Take 325 mg by mouth daily.  . Biotin 1 MG CAPS Take 1 mg by mouth daily.   . Cholecalciferol (VITAMIN D3) 5000 units TABS Take 5,000 Units by mouth daily.  Marland Kitchen docusate sodium (COLACE) 100 MG capsule Take 1 capsule (100 mg total) by mouth 2 (two) times daily.  . folic acid (FOLVITE) 400 MCG tablet Take 400 mcg by mouth  daily.  . levothyroxine (SYNTHROID, LEVOTHROID) 112 MCG tablet TAKE 1 TABLET EVERY DAY  . losartan (COZAAR) 25 MG tablet Take 25 mg by mouth daily.  . magnesium oxide (MAG-OX) 400 MG tablet Take 400 mg by  mouth daily.  . methocarbamol (ROBAXIN) 500 MG tablet TAKE 1 TABLET THREE TIMES DAILY AS NEEDED FOR MUSCLE SPASM(S)  . omeprazole (PRILOSEC) 40 MG capsule Take 1 capsule (40 mg total) by mouth daily.    Allergies: Patient has no known allergies.  Social History   Social History  . Marital status: Widowed    Spouse name: N/A  . Number of children: N/A  . Years of education: N/A   Occupational History  . Not on file.   Social History Main Topics  . Smoking status: Never Smoker  . Smokeless tobacco: Never Used  . Alcohol use No  . Drug use: No  . Sexual activity: No   Other Topics Concern  . Not on file   Social History Narrative  . No narrative on file    Family History  Problem Relation Age of Onset  . Heart disease Mother   . Hypertension Mother   . Diabetes Mother   . Heart attack Mother 91  . Heart disease Father   . Heart attack Father 48  . Breast cancer Maternal Aunt   . Heart attack Brother     Review of Systems: A 12-system review of systems was performed and was negative except as noted in the HPI.  --------------------------------------------------------------------------------------------------  Physical Exam: BP (!) 140/92   Pulse 96   Ht  (1.6 m)   Wt 174 lb (78.9 kg)   BMI 30.82 kg/m   General:  Obese woman, seated comfortably in the exam room. HEENT: No conjunctival pallor or scleral icterus. Moist mucous membranes.  OP clear. Neck: Supple without lymphadenopathy, thyromegaly, JVD, or HJR. No carotid bruit. Lungs: Normal work of breathing. Clear to auscultation bilaterally without wheezes or crackles. Heart: Regular rate and rhythm without murmurs, rubs, or gallops. Non-displaced PMI. Abd: Bowel sounds present. Soft, NT/ND without hepatosplenomegaly Ext: No lower extremity edema. Radial, PT, and DP pulses are 2+ bilaterally. Skin: Warm and dry without rash.  Lab Results  Component Value Date   WBC 5.2 03/04/2017   HGB 14.0  03/04/2017   HCT 40.4 03/04/2017   MCV 89.8 03/04/2017   PLT 215 03/04/2017    Lab Results  Component Value Date   NA 136 03/04/2017   K 3.8 03/04/2017   CL 104 03/04/2017   CO2 21 (L) 03/04/2017   BUN 21 (H) 03/04/2017   CREATININE 1.43 (H) 03/04/2017   GLUCOSE 97 03/04/2017   ALT 19 01/07/2017    Lab Results  Component Value Date   CHOL 127 10/08/2016   HDL 62 10/08/2016   LDLCALC 46 10/08/2016   TRIG 93 10/08/2016   CHOLHDL 2.0 10/08/2016    --------------------------------------------------------------------------------------------------  ASSESSMENT AND PLAN: Lightheadedness and near syncope Symptoms persist despite clear explanation. Orthostatic vital signs at our last visit were normal. 48 hour event monitor revealed occasional PACs and rare PVCs. Two nonconducted P waves were identified and are most suggestive of blocked PACs (2:1 AV block less likely; episode lasted less than 5 seconds). We have agreed to perform a 14 day event monitor for better assessment. Given for occasional PACs and rare PVCs, we have also agreed to a trial of low-dose metoprolol succinate (12.5 mg daily.  Shortness of breath Profound shortness of breath reported by  the patient with light activity. She has not had any chest pain. However, I wonder if dyspnea could be an anginal equivalent for her, in which case this would represent CCS class III-IV symptoms. MPI last year was without ischemia or scar. I do not think that grade 1 diastolic dysfunction noted on her recent echo counts for her symptoms. Pulmonary artery pressures were also normal. We have agreed to perform a coronary CTA for further assessment. This will also allow [place] to evaluate the lung parenchyma. I will start metoprolol succinate 12.5 mg daily today. If symptoms persist despite unrevealing CTA, Kimberly Cook may benefit from pulmonary evaluation, including cardiopulmonary stress test.  Hypertension Blood pressure is mildly elevated  today, though reportedly normal to low at home. We will add low-dose metoprolol, as above. I have asked Kimberly Cook to let us know if she has worsening of her lightheadedness with this medication change. If blood pressure remains labile, we may need to consider workup for pheochromocytoma, though my overall suspicion is low.  Follow-up: Return to clinic in 6 weeks (after completion of the event monitor).  Yvonne Kendall, MD 04/11/2017 12:16 PM

## 2017-04-12 LAB — BASIC METABOLIC PANEL
BUN/Creatinine Ratio: 17 (ref 12–28)
BUN: 17 mg/dL (ref 8–27)
CO2: 22 mmol/L (ref 20–29)
Calcium: 10 mg/dL (ref 8.7–10.3)
Chloride: 101 mmol/L (ref 96–106)
Creatinine, Ser: 1.02 mg/dL — ABNORMAL HIGH (ref 0.57–1.00)
GFR calc Af Amer: 64 mL/min/{1.73_m2} (ref >59)
GFR calc non Af Amer: 55 mL/min/{1.73_m2} — ABNORMAL LOW (ref >59)
Glucose: 101 mg/dL — ABNORMAL HIGH (ref 65–99)
Potassium: 4.3 mmol/L (ref 3.5–5.2)
Sodium: 140 mmol/L (ref 134–144)

## 2017-04-16 ENCOUNTER — Ambulatory Visit: Payer: Self-pay | Admitting: Interventional Cardiology

## 2017-04-16 ENCOUNTER — Ambulatory Visit: Payer: Self-pay | Admitting: Internal Medicine

## 2017-04-22 ENCOUNTER — Encounter: Payer: Self-pay | Admitting: Internal Medicine

## 2017-04-24 ENCOUNTER — Ambulatory Visit (INDEPENDENT_AMBULATORY_CARE_PROVIDER_SITE_OTHER): Payer: Medicare HMO

## 2017-04-24 DIAGNOSIS — R0609 Other forms of dyspnea: Secondary | ICD-10-CM | POA: Diagnosis not present

## 2017-04-24 DIAGNOSIS — F5101 Primary insomnia: Secondary | ICD-10-CM | POA: Diagnosis not present

## 2017-04-24 DIAGNOSIS — R55 Syncope and collapse: Secondary | ICD-10-CM

## 2017-04-24 DIAGNOSIS — I519 Heart disease, unspecified: Secondary | ICD-10-CM | POA: Diagnosis not present

## 2017-04-24 DIAGNOSIS — M85859 Other specified disorders of bone density and structure, unspecified thigh: Secondary | ICD-10-CM | POA: Diagnosis not present

## 2017-04-24 DIAGNOSIS — R002 Palpitations: Secondary | ICD-10-CM | POA: Diagnosis not present

## 2017-04-24 DIAGNOSIS — N390 Urinary tract infection, site not specified: Secondary | ICD-10-CM | POA: Diagnosis not present

## 2017-04-24 DIAGNOSIS — R0602 Shortness of breath: Secondary | ICD-10-CM

## 2017-04-24 DIAGNOSIS — Z23 Encounter for immunization: Secondary | ICD-10-CM | POA: Diagnosis not present

## 2017-04-24 DIAGNOSIS — R42 Dizziness and giddiness: Secondary | ICD-10-CM | POA: Diagnosis not present

## 2017-04-24 DIAGNOSIS — F419 Anxiety disorder, unspecified: Secondary | ICD-10-CM | POA: Diagnosis not present

## 2017-04-28 DIAGNOSIS — G4700 Insomnia, unspecified: Secondary | ICD-10-CM | POA: Diagnosis not present

## 2017-04-30 ENCOUNTER — Ambulatory Visit (HOSPITAL_COMMUNITY)
Admission: RE | Admit: 2017-04-30 | Discharge: 2017-04-30 | Disposition: A | Discharge status: Home / Self Care | Payer: Medicare HMO | Source: Ambulatory Visit | Attending: Internal Medicine | Admitting: Internal Medicine

## 2017-04-30 DIAGNOSIS — R55 Syncope and collapse: Secondary | ICD-10-CM

## 2017-04-30 DIAGNOSIS — R0602 Shortness of breath: Secondary | ICD-10-CM | POA: Diagnosis not present

## 2017-04-30 DIAGNOSIS — I2510 Atherosclerotic heart disease of native coronary artery without angina pectoris: Secondary | ICD-10-CM | POA: Diagnosis not present

## 2017-04-30 DIAGNOSIS — I639 Cerebral infarction, unspecified: Secondary | ICD-10-CM | POA: Diagnosis not present

## 2017-04-30 DIAGNOSIS — I6522 Occlusion and stenosis of left carotid artery: Secondary | ICD-10-CM | POA: Insufficient documentation

## 2017-04-30 DIAGNOSIS — I10 Essential (primary) hypertension: Secondary | ICD-10-CM | POA: Diagnosis not present

## 2017-04-30 MED ORDER — METOPROLOL TARTRATE 5 MG/5ML IV SOLN
INTRAVENOUS | Status: AC
  Filled 2017-04-30: qty 5

## 2017-04-30 MED ORDER — NITROGLYCERIN 0.4 MG SL SUBL
0.8000 mg | SUBLINGUAL_TABLET | Freq: Once | SUBLINGUAL | Status: AC
  Administered 2017-04-30: 0.8 mg via SUBLINGUAL

## 2017-04-30 MED ORDER — IOPAMIDOL (ISOVUE-370) INJECTION 76%
INTRAVENOUS | Status: AC
  Administered 2017-04-30: 100 mL
  Filled 2017-04-30: qty 100

## 2017-04-30 MED ORDER — METOPROLOL TARTRATE 5 MG/5ML IV SOLN
5.0000 mg | Freq: Once | INTRAVENOUS | Status: AC
  Administered 2017-04-30: 5 mg via INTRAVENOUS

## 2017-04-30 MED ORDER — NITROGLYCERIN 0.4 MG SL SUBL
SUBLINGUAL_TABLET | SUBLINGUAL | Status: AC
  Filled 2017-04-30: qty 2

## 2017-06-02 ENCOUNTER — Encounter (INDEPENDENT_AMBULATORY_CARE_PROVIDER_SITE_OTHER): Payer: Self-pay

## 2017-06-02 ENCOUNTER — Ambulatory Visit (INDEPENDENT_AMBULATORY_CARE_PROVIDER_SITE_OTHER): Payer: Medicare HMO | Admitting: Internal Medicine

## 2017-06-02 ENCOUNTER — Encounter: Payer: Self-pay | Admitting: Internal Medicine

## 2017-06-02 VITALS — BP 124/86 | HR 87 | Ht 63.0 in | Wt 179.0 lb

## 2017-06-02 DIAGNOSIS — I10 Essential (primary) hypertension: Secondary | ICD-10-CM

## 2017-06-02 DIAGNOSIS — R42 Dizziness and giddiness: Secondary | ICD-10-CM | POA: Diagnosis not present

## 2017-06-02 DIAGNOSIS — M5431 Sciatica, right side: Secondary | ICD-10-CM | POA: Diagnosis not present

## 2017-06-02 DIAGNOSIS — R0602 Shortness of breath: Secondary | ICD-10-CM | POA: Diagnosis not present

## 2017-06-02 DIAGNOSIS — I2510 Atherosclerotic heart disease of native coronary artery without angina pectoris: Secondary | ICD-10-CM | POA: Diagnosis not present

## 2017-06-02 DIAGNOSIS — E782 Mixed hyperlipidemia: Secondary | ICD-10-CM

## 2017-06-02 DIAGNOSIS — M5441 Lumbago with sciatica, right side: Secondary | ICD-10-CM | POA: Diagnosis not present

## 2017-06-02 MED ORDER — METOPROLOL SUCCINATE ER 25 MG PO TB24: 12.5000 mg | ORAL_TABLET | Freq: Every day | ORAL | 3 refills | Status: DC

## 2017-06-02 MED ORDER — ATORVASTATIN CALCIUM 20 MG PO TABS: 20.0000 mg | ORAL_TABLET | Freq: Every day | ORAL | 1 refills | Status: DC

## 2017-06-02 NOTE — Patient Instructions (Addendum)
Medication Instructions:  Increase lipitor (atorvastatin) to 20 mg daily. You can take 2 of your 10 mg tablets daily at the same time and use your current supply.  Labwork: Your physician recommends that you return for a FASTING lipid profile/ALT in 3 months. Do not eat or drink after midnight the night before your lab. The lab is open from 7:30 AM-5 PM.   Testing/Procedures: None   Follow-Up: Your physician recommends that you schedule a follow-up appointment in 3 months with Dr End a few months after your lab has been done  You have been referred to [facility].  Any Other Special Instructions Will Be Listed Below (If Applicable).     If you need a refill on your cardiac medications before your next appointment, please call your pharmacy.

## 2017-06-02 NOTE — Progress Notes (Signed)
Follow-up Outpatient Visit Date: 06/02/2017  Primary Care Provider: Lorenda IshiharaVaradarajan, Rupashree, MD [address] [place] [id number]  Chief Complaint: Shortness of breath  HPI:  Ms. Mayford KnifeWilliams is a 71 y.o. year-old female with history of hypertension, hyperlipidemia, "mini strokes," GERD, depression, and anxiety, who presents for follow-up of shortness of breath and lightheadedness.  I last saw Ms. Mayford KnifeWilliams in early September, at which time she continued to have episodes of significant dyspnea, as well as episodic lightheadedness.  We agreed to perform a 14-day event monitor and coronary CTA.  The event monitor was unrevealing.  The CTA showed mild to moderate disease involving the LAD.  Apical LAD had borderline positive CT-FFR at 0.79.  Coronary calcium score was 257.  Today, Ms. Mayford KnifeWilliams reports that her breathing has been about the same.  She continues to get short of breath intermittently with out any significant activity.  Sometimes, just talking on the phone causes her to feel short of breath.  At other times, she is able to do light to moderate activity around the house without significant limitation.  Lightheadedness has improved since our last visit.  Her blood pressure is also not dropping as much.  Typically, her blood pressures are in the 110-130/60-70 range.  She has had a couple of isolated diastolic blood pressures in the 40s.  She denies chest pain, palpitations, and edema.  Ms. Mayford KnifeWilliams began having back pain last week and reports that 1 of her "disks slipped" over the weekend.  She continues to have quite a bit of back pain, though it is gradually improving with combination of methocarbamol and acetaminophen.  She is planning to move into an independent living facility and asks about a letter describing her limitations [place] [facility] with [facility]  [facility].  --------------------------------------------------------------------------------------------------  Cardiovascular History & Procedures: Cardiovascular Problems:  Orthostatic lightheadedness and labile blood pressure  Dyspnea on exertion  Risk Factors:  Hypertension, hyperlipidemia, family history, sedentary lifestyle, and age greater than 4365  Cath/PCI:  None  CV Surgery:  None  EP Procedures and Devices:  14-day event monitor (04/24/17): Sinus rhythm without arrhythmias.  48 hour Holter monitor (03/20/17): Predominantly sinus rhythm with occasional PACs and rare PVCs. Brief episode of blocked PACs versus 2:1 AV block.  Non-Invasive Evaluation(s):  Coronary CTA (04/30/17): Mild to moderate coronary artery disease proximal LAD and ostium of small D2.  CT FFR of distal most LAD is 0.79.  Coronary calcium score 257.  Small left lower lobe nodule (3 mm).  TTE (03/20/17): Normal LV size with mild focal basal hypertrophy of the septum. LVEF 50-55% with normal wall motion. Grade 1 diastolic dysfunction. Mitral annular calcification present. Normal RV size and function. Normal pulmonary artery pressure.  Pharmacologic MPI (12/14/15): Low risk without ischemia or scar. LVEF 68%.  TTE (12/14/15): Normal LV size and wall thickness. LVEF 55-60%.Normal RV size and function. No significant valvular abnormalities.  Recent CV Pertinent Labs: Lab Results  Component Value Date   CHOL 127 10/08/2016   HDL 62 10/08/2016   LDLCALC 46 10/08/2016   TRIG 93 10/08/2016   CHOLHDL 2.0 10/08/2016   INR 1.06 04/06/2015   K 4.3 04/11/2017   MG 2.1 01/07/2017   BUN 17 04/11/2017   CREATININE 1.02 (H) 04/11/2017   CREATININE 1.18 (H) 01/07/2017    Past medical and surgical history were reviewed and updated in EPIC.  Current Meds  Medication Sig  . amitriptyline (ELAVIL) 25 MG tablet Take 25  mg by mouth daily.  Marland Kitchen aspirin 325 MG tablet Take 325 mg by mouth daily.  . Biotin 1 MG  CAPS Take 1 mg by mouth daily.   . Cholecalciferol (VITAMIN D3) 5000 units TABS Take 5,000 Units by mouth daily.  Marland Kitchen docusate sodium (COLACE) 100 MG capsule Take 1 capsule (100 mg total) by mouth 2 (two) times daily.  . folic acid (FOLVITE) 400 MCG tablet Take 400 mcg by mouth daily.  Marland Kitchen levothyroxine (SYNTHROID, LEVOTHROID) 112 MCG tablet TAKE 1 TABLET EVERY DAY  . losartan (COZAAR) 25 MG tablet Take 25 mg by mouth daily.  . methocarbamol (ROBAXIN) 500 MG tablet TAKE 1 TABLET THREE TIMES DAILY AS NEEDED FOR MUSCLE SPASM(S)  . metoprolol succinate (TOPROL-XL) 25 MG 24 hr tablet Take 0.5 tablets (12.5 mg total) by mouth daily. Take with or immediately following a meal.  . omeprazole (PRILOSEC) 40 MG capsule Take 1 capsule (40 mg total) by mouth daily.  . [DISCONTINUED] atorvastatin (LIPITOR) 10 MG tablet Take 10 mg by mouth at bedtime.  . [DISCONTINUED] metoprolol succinate (TOPROL-XL) 25 MG 24 hr tablet Take 0.5 tablets (12.5 mg total) by mouth daily. Take with or immediately following a meal.    Allergies: Patient has no known allergies.  Social History   Social History  . Marital status: Widowed    Spouse name: N/A  . Number of children: N/A  . Years of education: N/A   Occupational History  . Not on file.   Social History Main Topics  . Smoking status: Passive Smoke Exposure - Never Smoker  . Smokeless tobacco: Never Used     Comment: husbands and children smoked in home.   . Alcohol use No  . Drug use: No  . Sexual activity: No   Other Topics Concern  . Not on file   Social History Narrative  . No narrative on file    Family History  Problem Relation Age of Onset  . Heart disease Mother   . Hypertension Mother   . Diabetes Mother   . Heart attack Mother 60  . Heart disease Father   . Heart attack Father 90  . Breast cancer Maternal Aunt   . Heart attack Brother     Review of Systems: Review of Systems  Constitutional: Negative.   HENT: Negative.   Eyes:  Negative.   Respiratory: Positive for shortness of breath.   Cardiovascular: Negative.   Gastrointestinal: Negative.   Genitourinary: Negative.   Musculoskeletal: Positive for back pain and joint pain.  Skin: Negative.   Neurological: Positive for dizziness and headaches.  Endo/Heme/Allergies: Bruises/bleeds easily.  Psychiatric/Behavioral: Negative.    --------------------------------------------------------------------------------------------------  Physical Exam: BP 124/86   Pulse 87   Ht 5\' 3"  (1.6 m)   Wt 179 lb (81.2 kg)   SpO2 98%   BMI 31.71 kg/m   General: Obese woman, seated comfortably in the exam room.  Antalgic gait noted owing to back pain. HEENT: No conjunctival pallor or scleral icterus. Moist mucous membranes.  OP clear. Neck: Supple without lymphadenopathy, thyromegaly, JVD, or HJR. Lungs: Normal work of breathing. Clear to auscultation bilaterally without wheezes or crackles. Heart: Regular rate and rhythm without murmurs, rubs, or gallops. Non-displaced PMI. Abd: Bowel sounds present. Soft, NT/ND without hepatosplenomegaly Ext: No lower extremity edema. Radial, PT, and DP pulses are 2+ bilaterally. Skin: Warm and dry without rash.  Lab Results  Component Value Date   WBC 5.2 03/04/2017   HGB 14.0 03/04/2017   HCT 40.4  03/04/2017   MCV 89.8 03/04/2017   PLT 215 03/04/2017    Lab Results  Component Value Date   NA 140 04/11/2017   K 4.3 04/11/2017   CL 101 04/11/2017   CO2 22 04/11/2017   BUN 17 04/11/2017   CREATININE 1.02 (H) 04/11/2017   GLUCOSE 101 (H) 04/11/2017   ALT 19 01/07/2017    Lab Results  Component Value Date   CHOL 127 10/08/2016   HDL 62 10/08/2016   LDLCALC 46 10/08/2016   TRIG 93 10/08/2016   CHOLHDL 2.0 10/08/2016   Outside labs (03/03/17): Total cholesterol 227, HDL 52, LDL 138, triglycerides 188  --------------------------------------------------------------------------------------------------  ASSESSMENT AND  PLAN: Shortness of breath Symptoms are not significantly better since our last visit.  Overall, Ms. Lemus is tolerating low-dose metoprolol reasonably well.  Given that her shortness of breath occurs even with talking on the phone, I am not convinced that her grade 1 diastolic dysfunction or mild to moderate CAD are the underlying cause.  I will refer Ms. Fok to pulmonary for further assessment.  I have asked Ms. Poche to speak with her PCP about [facility] with [facility] to an [facility] facility.  Coronary artery disease without angina Myocardial perfusion stress test last year was low risk.  Her coronary CTA last month showed mild to moderate disease involving the LAD and diagonal branch.  CT-FFR of the apical LAD was borderline at 0.79.  I do not believe that this explains her symptoms alone.  We will continue with medical therapy and risk factor modification.  We will increase atorvastatin, as below.  If her pulmonary workup is unrevealing, we could empirically try ranolazine in the future to see if this helps with her symptoms.  Hypertension and orthostatic lightheadedness Blood pressure today is well controlled.  Ms. Beazer continues to have episodes of lightheadedness and intermittent borderline low blood pressures.  I have asked her to continue tracking her blood pressure at home and to remain well-hydrated.  I will not make any changes to her blood pressure medications today.  Hyperlipidemia Outside labs in July are notable for an LDL of 138.  In light of her mild to moderate coronary artery disease, I believe that Ms. Domanski would benefit from more aggressive lipid control.  We have agreed to increase atorvastatin to 20 mg daily.  We will repeat a fasting lipid panel and ALT in 3 months.  Follow-up: Return to clinic in 3 months.  Yvonne Kendall, MD 06/03/2017 12:36 PM

## 2017-06-03 ENCOUNTER — Ambulatory Visit (INDEPENDENT_AMBULATORY_CARE_PROVIDER_SITE_OTHER): Payer: Medicare HMO | Admitting: Pulmonary Disease

## 2017-06-03 ENCOUNTER — Encounter: Payer: Self-pay | Admitting: Pulmonary Disease

## 2017-06-03 ENCOUNTER — Encounter: Payer: Self-pay | Admitting: Internal Medicine

## 2017-06-03 VITALS — BP 132/80 | HR 91 | Ht 63.0 in | Wt 174.8 lb

## 2017-06-03 DIAGNOSIS — R5383 Other fatigue: Secondary | ICD-10-CM

## 2017-06-03 DIAGNOSIS — R911 Solitary pulmonary nodule: Secondary | ICD-10-CM | POA: Diagnosis not present

## 2017-06-03 DIAGNOSIS — R0600 Dyspnea, unspecified: Secondary | ICD-10-CM

## 2017-06-03 DIAGNOSIS — I2510 Atherosclerotic heart disease of native coronary artery without angina pectoris: Secondary | ICD-10-CM | POA: Insufficient documentation

## 2017-06-03 DIAGNOSIS — I25118 Atherosclerotic heart disease of native coronary artery with other forms of angina pectoris: Secondary | ICD-10-CM | POA: Insufficient documentation

## 2017-06-03 NOTE — Patient Instructions (Addendum)
Shortness of breath: We will obtain a lung function test We will check ambulatory oxygen monitoring  For your fatigue: We will check a thyroid test called TSH We will obtain records from your visit with Dr. Allie Dimmer  Pulmonary nodule seen on CT chest Repeat CT chest in 1 year  We will see you back in 1-2 weeks after the breathing test has been done

## 2017-06-03 NOTE — Progress Notes (Signed)
Subjective:    Patient ID: Kimberly Cook, female    DOB: April 19, 1946, 71 y.o.   MRN: [id number]  Synopsis: Referred in 2018 for evaluation of dyspnea.  HPI Chief Complaint  Patient presents with  . Advice Only    Referred for shortness of breath by Dr. Okey Dupre.     Kimberly Cook is here to see me for evaluation of shortness of breath: > back in the Spring or Jue (she's not sure) she had problems with her blood pressure fluctuating a lot > when she would just walk in the kitchen or take just a few steps she would have dyspnea > she initially went to her PCP and then was referred to cardiology > the dyspnea isn't any worse, she thinks > she says that she has monitored her oxygen level when she exerts herself: she hasn't seen lower numbers > she says that when she feels short of breaht she feels tired > she says that when she walks less than 100 yards on level ground she has to stop to catch her breath even when not sohrt of breath > she can't make it through a grocery store without getting short of breath > she has to get help carrying groceries into the house > she says taht she starts breathing hard and its like she can't get the air in  She has noticed that she is dropping things more than previously.  She says that she has trouble with balance.  She struggles with back pain.  She will notices some occassional numbness rarely, doesn't happen too often.    Never told she had asthma.  No childhood repiratory illnesses, but she was born prematurely at 73 gestation.    She never smoked.  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     Past Medical History:  Diagnosis Date  . Allergy   . Arthritis   . Depression   . Essential hypertension    Normal cardiolite 05/2006 EF 71%  . GERD (gastroesophageal reflux disease)   . Headache   . Hyperlipidemia   .  Hypothyroidism   . Mini stroke (HCC) 2011   . Occipital neuralgia   . Prediabetes   . Urinary tract infection   . Vitamin D deficiency      Family History  Problem Relation Age of Onset  . Heart disease Mother   . Hypertension Mother   . Diabetes Mother   . Heart attack Mother 76  . Heart disease Father   . Heart attack Father 59  . Breast cancer Maternal Aunt   . Heart attack Brother      Social History   Social History  . Marital status: Widowed    Spouse name: N/A  . Number of children: N/A  . Years of education: N/A   Occupational History  . Not on file.   Social History Main Topics  . Smoking status: Passive Smoke Exposure - Never Smoker  . Smokeless tobacco: Never Used     Comment: husbands and children smoked in home.   . Alcohol use No  . Drug use: No  . Sexual activity: No   Other Topics Concern  . Not on file   Social History Narrative  . No narrative on file     No Known Allergies   Outpatient Medications Prior to Visit  Medication Sig Dispense Refill  . amitriptyline (ELAVIL) 25 MG tablet Take 25 mg by mouth daily.    Marland Kitchen. aspirin 325 MG tablet Take 325 mg by mouth daily.    Marland Kitchen. atorvastatin (LIPITOR) 20 MG tablet Take 1 tablet (20 mg total) by mouth daily. 90 tablet 1  . Biotin 1 MG CAPS Take 1 mg by mouth daily.     . Cholecalciferol (VITAMIN D3) 5000 units TABS Take 5,000 Units by mouth daily.    Marland Kitchen. docusate sodium (COLACE) 100 MG capsule Take 1 capsule (100 mg total) by mouth 2 (two) times daily. 10 capsule 0  . folic acid (FOLVITE) 400 MCG tablet Take 400 mcg by mouth daily.    Marland Kitchen. levothyroxine (SYNTHROID, LEVOTHROID) 112 MCG tablet TAKE 1 TABLET EVERY DAY 90 tablet 1  . losartan (COZAAR) 25 MG tablet Take 25 mg by mouth daily.    . methocarbamol (ROBAXIN) 500 MG tablet TAKE 1 TABLET THREE TIMES DAILY AS NEEDED FOR MUSCLE SPASM(S) 90 tablet 0  . metoprolol succinate (TOPROL-XL) 25 MG 24 hr tablet Take 0.5 tablets (12.5 mg total) by mouth daily.  Take with or immediately following a meal. 45 tablet 3  . omeprazole (PRILOSEC) 40 MG capsule Take 1 capsule (40 mg total) by mouth daily. 90 capsule 1   No facility-administered medications prior to visit.       Review of Systems  Constitutional: Negative for fever and unexpected weight change.  HENT: Negative for congestion, dental problem, ear pain, nosebleeds, postnasal drip, rhinorrhea, sinus pressure, sneezing, sore throat and trouble swallowing.   Eyes: Negative for redness and itching.  Respiratory: Positive for shortness of breath. Negative for cough, chest tightness and wheezing.   Cardiovascular: Negative for palpitations and leg swelling.  Gastrointestinal: Negative for nausea and vomiting.  Genitourinary: Negative for dysuria.  Musculoskeletal: Negative for joint swelling.  Skin: Negative for rash.  Neurological: Negative for headaches.  Hematological: Does not bruise/bleed easily.  Psychiatric/Behavioral: Negative for dysphoric mood. The patient is not nervous/anxious.        Objective:   Physical Exam  Vitals:   06/03/17 0943  BP: 132/80  Pulse: 91  SpO2: 96%  Weight: 174 lb 12.8 oz (79.3 kg)  Height: 5\' 3"  (1.6 m)   Gen: chronically ill appearing, no acute distress HENT: NCAT, OP clear, neck supple without masses Eyes: PERRL, EOMi Lymph: no cervical lymphadenopathy PULM: CTA B CV: RRR, no mgr, no JVD GI: BS+, soft, nontender, no hsm Derm: no rash or skin breakdown MSK: normal bulk and tone Neuro: A&Ox4, CN II-XII intact, strength 5/5 in all 4 extremities Psyche: normal mood and affect   Chest imaging: 04/2017 CT chest> 3mm left lower lobe pulmonary nodule, moderate sized hiatal hernia, images independently reviewed, pulmonary parenchyma otherwise normal  Echo: 03/2017 TTE: LVEF 55%, mild focal LVH, Grade 1 DD  Nuclear stress test: November 2017 low risk study, LVEF 68%  CBC    Component Value Date/Time   WBC 5.2 03/04/2017 1723   RBC 4.50  03/04/2017 1723   HGB 14.0 03/04/2017 1723   HCT 40.4 03/04/2017 1723   PLT 215 03/04/2017 1723   MCV 89.8 03/04/2017 1723   MCH 31.1 03/04/2017 1723   MCHC 34.7 03/04/2017 1723   RDW 12.7 03/04/2017 1723   LYMPHSABS 1,408 01/07/2017 1213   MONOABS 512 01/07/2017 1213   EOSABS 192 01/07/2017 1213   BASOSABS 0 01/07/2017 1213      Cardiology records reviewed where she was evaluated for  dyspnea, had an echo that showed diastolic dysfunction     Assessment & Plan:    Other fatigue - Plan: TSH  Dyspnea, unspecified type - Plan: Pulmonary function test  Discussion:  Amaia presents to clinic today for evaluation of shortness of breath in the setting of generalized fatigue.  Its hard for her to describe her symptoms, but she does note that she often feels that she cannot get enough breath in.  This is developed relatively quickly over the last 6-8 months.  She says that a year ago she was feeling fine without this problem.  She is also noticed some neuromuscular weakness during this time.  Objectively, her lungs sound clear, her oxygenation at rest is normal and the recent images of her CT chest was essentially normal with the exception of a small pulmonary nodule in the left lower lobe.  She does have an excessive smoke exposure history given her occupation and family members.  She never smoked herself but she has had a high burden of secondhand smoke exposure so she is at increased risk for COPD.  Differential diagnosis of dyspnea is broad including lung disease, heart disease, neuromuscular weakness, and anemia.  She is not anemic, she does appear to be generally weak but there is no focal weakness on exam.  Her recent cardiac evaluation showed evidence of diastolic dysfunction.  I think there is a strong component of deconditioning, but considering her complaint of recent weakness if we do not see evidence of clear lung tissue abnormality on her pulmonary function testing then we  probably need to pursue a neurologic evaluation.  I will request records from her recent visit with a sleep physician and we need to check a TSH to make sure her thyroid is not contributing.  Plan: Shortness of breath: We will obtain a lung function test We will check ambulatory oxygen monitoring  For your fatigue: We will check a thyroid test called TSH We will obtain records from your visit with Dr. Allie Dimmer  Pulmonary nodule seen on CT chest Repeat CT chest in 1 year  We will see you back in 1-2 weeks after the breathing test has been done    Current Outpatient Prescriptions:  .  amitriptyline (ELAVIL) 25 MG tablet, Take 25 mg by mouth daily., Disp: , Rfl:  .  aspirin 325 MG tablet, Take 325 mg by mouth daily., Disp: , Rfl:  .  atorvastatin (LIPITOR) 20 MG tablet, Take 1 tablet (20 mg total) by mouth daily., Disp: 90 tablet, Rfl: 1 .  Biotin 1 MG CAPS, Take 1 mg by mouth daily. , Disp: , Rfl:  .  Cholecalciferol (VITAMIN D3) 5000 units TABS, Take 5,000 Units by mouth daily., Disp: , Rfl:  .  docusate sodium (COLACE) 100 MG capsule, Take 1 capsule (100 mg total) by mouth 2 (two) times daily., Disp: 10 capsule, Rfl: 0 .  folic acid (FOLVITE) 400 MCG tablet, Take 400 mcg by mouth daily., Disp: , Rfl:  .  levothyroxine (SYNTHROID, LEVOTHROID) 112 MCG tablet, TAKE 1 TABLET EVERY DAY, Disp: 90 tablet, Rfl: 1 .  losartan (COZAAR) 25 MG tablet, Take 25 mg by mouth daily., Disp: , Rfl:  .  methocarbamol (ROBAXIN) 500 MG tablet, TAKE 1 TABLET THREE TIMES DAILY AS NEEDED FOR MUSCLE SPASM(S), Disp: 90 tablet, Rfl: 0 .  metoprolol succinate (TOPROL-XL) 25 MG 24 hr tablet, Take 0.5 tablets (12.5 mg total) by mouth daily. Take with or immediately following a meal., Disp: 45 tablet, Rfl:  3 .  omeprazole (PRILOSEC) 40 MG capsule, Take 1 capsule (40 mg total) by mouth daily., Disp: 90 capsule, Rfl: 1

## 2017-06-06 DIAGNOSIS — I2510 Atherosclerotic heart disease of native coronary artery without angina pectoris: Secondary | ICD-10-CM | POA: Diagnosis not present

## 2017-06-06 DIAGNOSIS — M5431 Sciatica, right side: Secondary | ICD-10-CM | POA: Diagnosis not present

## 2017-06-06 DIAGNOSIS — E785 Hyperlipidemia, unspecified: Secondary | ICD-10-CM | POA: Diagnosis not present

## 2017-06-06 DIAGNOSIS — I519 Heart disease, unspecified: Secondary | ICD-10-CM | POA: Diagnosis not present

## 2017-06-06 DIAGNOSIS — M1990 Unspecified osteoarthritis, unspecified site: Secondary | ICD-10-CM | POA: Diagnosis not present

## 2017-06-06 DIAGNOSIS — I10 Essential (primary) hypertension: Secondary | ICD-10-CM | POA: Diagnosis not present

## 2017-06-06 DIAGNOSIS — R0609 Other forms of dyspnea: Secondary | ICD-10-CM | POA: Diagnosis not present

## 2017-06-17 ENCOUNTER — Ambulatory Visit: Payer: Medicare HMO | Admitting: Adult Health

## 2017-06-23 DIAGNOSIS — M5431 Sciatica, right side: Secondary | ICD-10-CM | POA: Diagnosis not present

## 2017-06-23 DIAGNOSIS — I951 Orthostatic hypotension: Secondary | ICD-10-CM | POA: Diagnosis not present

## 2017-06-23 DIAGNOSIS — I519 Heart disease, unspecified: Secondary | ICD-10-CM | POA: Diagnosis not present

## 2017-06-23 DIAGNOSIS — Z0000 Encounter for general adult medical examination without abnormal findings: Secondary | ICD-10-CM | POA: Diagnosis not present

## 2017-06-23 DIAGNOSIS — E785 Hyperlipidemia, unspecified: Secondary | ICD-10-CM | POA: Diagnosis not present

## 2017-06-23 DIAGNOSIS — F418 Other specified anxiety disorders: Secondary | ICD-10-CM | POA: Diagnosis not present

## 2017-06-23 DIAGNOSIS — F5101 Primary insomnia: Secondary | ICD-10-CM | POA: Diagnosis not present

## 2017-06-23 DIAGNOSIS — K219 Gastro-esophageal reflux disease without esophagitis: Secondary | ICD-10-CM | POA: Diagnosis not present

## 2017-06-23 DIAGNOSIS — I10 Essential (primary) hypertension: Secondary | ICD-10-CM | POA: Diagnosis not present

## 2017-06-30 ENCOUNTER — Ambulatory Visit (INDEPENDENT_AMBULATORY_CARE_PROVIDER_SITE_OTHER): Payer: Medicare HMO | Admitting: Pulmonary Disease

## 2017-06-30 ENCOUNTER — Ambulatory Visit: Payer: Medicare HMO | Admitting: Adult Health

## 2017-06-30 ENCOUNTER — Encounter: Payer: Self-pay | Admitting: Adult Health

## 2017-06-30 ENCOUNTER — Other Ambulatory Visit: Payer: Self-pay | Admitting: Pharmacy Technician

## 2017-06-30 DIAGNOSIS — R911 Solitary pulmonary nodule: Secondary | ICD-10-CM | POA: Insufficient documentation

## 2017-06-30 DIAGNOSIS — R0602 Shortness of breath: Secondary | ICD-10-CM

## 2017-06-30 DIAGNOSIS — R0600 Dyspnea, unspecified: Secondary | ICD-10-CM

## 2017-06-30 LAB — PULMONARY FUNCTION TEST
DL/VA % pred: 81 %
DL/VA: 3.8 ml/min/mmHg/L
DLCO cor % pred: 77 %
DLCO cor: 17.72 ml/min/mmHg
DLCO unc % pred: 78 %
DLCO unc: 17.93 ml/min/mmHg
FEF 25-75 Post: 2.59 L/sec
FEF 25-75 Pre: 2.32 L/sec
FEF2575-%Change-Post: 11 %
FEF2575-%Pred-Post: 151 %
FEF2575-%Pred-Pre: 135 %
FEV1-%Change-Post: 2 %
FEV1-%Pred-Post: 112 %
FEV1-%Pred-Pre: 109 %
FEV1-Post: 2.35 L
FEV1-Pre: 2.28 L
FEV1FVC-%Change-Post: 5 %
FEV1FVC-%Pred-Pre: 105 %
FEV6-%Change-Post: 0 %
FEV6-%Pred-Post: 106 %
FEV6-%Pred-Pre: 107 %
FEV6-Post: 2.82 L
FEV6-Pre: 2.84 L
FEV6FVC-%Change-Post: 1 %
FEV6FVC-%Pred-Post: 105 %
FEV6FVC-%Pred-Pre: 103 %
FVC-%Change-Post: -2 %
FVC-%Pred-Post: 101 %
FVC-%Pred-Pre: 103 %
FVC-Post: 2.82 L
FVC-Pre: 2.87 L
Post FEV1/FVC ratio: 83 %
Post FEV6/FVC ratio: 100 %
Pre FEV1/FVC ratio: 79 %
Pre FEV6/FVC Ratio: 99 %
RV % pred: 106 %
RV: 2.33 L
TLC % pred: 106 %
TLC: 5.22 L

## 2017-06-30 NOTE — Assessment & Plan Note (Signed)
?   Etiology . Possible deconditioning  PFT is normal .  Extensive Workup up has been unrevealing . Could consider CPST but she does not want to proceed with additional testing right now .   Plan  Patient Instructions  Follow up in September 2019 after CT scan Dr. Kendrick Fries  CT chest 04/2018 for lung nodule  Please contact office for sooner follow up if symptoms do not improve or worsen or seek emergency care

## 2017-06-30 NOTE — Assessment & Plan Note (Signed)
Check CT chest in 1 year ~04/2018

## 2017-06-30 NOTE — Progress Notes (Signed)
PFT done today by Rune Mendez, CMA  

## 2017-06-30 NOTE — Patient Instructions (Addendum)
Follow up in September 2019 after CT scan Dr. Kendrick Fries  CT chest 04/2018 for lung nodule  Please contact office for sooner follow up if symptoms do not improve or worsen or seek emergency care

## 2017-06-30 NOTE — Progress Notes (Signed)
 @Patient  ID: Kimberly Cook, female    DOB: May 27, 1946, 71 y.o.   MRN: [id number]  Chief Complaint  Patient presents with  . Follow-up    Referring provider: Lorenda IshiharaVaradarajan, Rupashree,*  HPI: 71 yo female seen for pulmonary consult 08/24/16 for dyspnea/fatigue   TEST  Chest imaging: 04/2017 CT chest> 3mm left lower lobe pulmonary nodule, moderate sized hiatal hernia, images independently reviewed, pulmonary parenchyma otherwise normal  Echo: 03/2017 TTE: LVEF 55%, mild focal LVH, Grade 1 DD  Nuclear stress test: November 2017 low risk study, LVEF 68%  06/30/2017  Follow up : Dyspnea  Patient presents for a one-month follow-up.  Patient was seen for pulmonary consult last visit for shortness of breath has been occurring for the last 6-9 months.  Patient had associated fatigue and would get out of breath with prolonged walking..  workup prior to pulmonary consult showed a CT chest in September 2018 was normal except for a small 3 mm left lower lobe nodule and a moderate size hiatal hernia.  2D echo August 2018 showed mild grade 1 diastolic dysfunction with a normal EF.  Cardiac stress test November 2017 with a low risk study with an EF at 68%.  Lab work done in June showed a normal TSH and normal hemoglobin hematocrit.  Pulmonary function testing done today shows normal lung function with an FEV1 at 112% ratio 83 no significant bronchodilator response. DLCO 78%.  We discussed her test results.  Patient says she has been feeling better of recent.  She denies any chest pain orthopnea PND or increased leg swelling.   No Known Allergies  Immunization History  Administered Date(s) Administered  . DT 09/26/2015  . Influenza, High Dose Seasonal PF 06/16/2015, 04/02/2016, 05/06/2017  . Influenza-Unspecified 04/18/2013, 04/05/2014  . PPD Test 04/19/2015  . Pneumococcal Conjugate-13 06/16/2015  . Pneumococcal-Unspecified 08/18/2010  . Td 08/18/2005  . Zoster 08/18/2005    Past Medical  History:  Diagnosis Date  . Allergy   . Arthritis   . Coronary artery disease 04/2017   Mild to moderate CAD in LAD/diagonal by CTA (CT-FFR of apical LAD 0.79).  . Depression   . Essential hypertension    Normal cardiolite 05/2006 EF 71%  . GERD (gastroesophageal reflux disease)   . Headache   . Hyperlipidemia   . Hypothyroidism   . Mini stroke (HCC) 2011   . Occipital neuralgia   . Prediabetes   . Urinary tract infection   . Vitamin D deficiency     Tobacco History: Social History   Tobacco Use  Smoking Status Passive Smoke Exposure - Never Smoker  Smokeless Tobacco Never Used  Tobacco Comment   husbands and children smoked in home.    Counseling given: Not Answered Comment: husbands and children smoked in home.    Outpatient Encounter Medications as of 06/30/2017  Medication Sig  . amitriptyline (ELAVIL) 25 MG tablet Take 25 mg by mouth daily.  Marland Kitchen. aspirin 325 MG tablet Take 325 mg by mouth daily.  Marland Kitchen. atorvastatin (LIPITOR) 20 MG tablet Take 1 tablet (20 mg total) by mouth daily.  . Biotin 1 MG CAPS Take 1 mg by mouth daily.   . Cholecalciferol (VITAMIN D3) 5000 units TABS Take 5,000 Units by mouth daily.  Marland Kitchen. docusate sodium (COLACE) 100 MG capsule Take 1 capsule (100 mg total) by mouth 2 (two) times daily.  . folic acid (FOLVITE) 400 MCG tablet Take 400 mcg by mouth daily.  Marland Kitchen. levothyroxine (SYNTHROID, LEVOTHROID) 112 MCG tablet TAKE  1 TABLET EVERY DAY  . losartan (COZAAR) 25 MG tablet Take 25 mg by mouth daily.  . methocarbamol (ROBAXIN) 500 MG tablet TAKE 1 TABLET THREE TIMES DAILY AS NEEDED FOR MUSCLE SPASM(S)  . metoprolol succinate (TOPROL-XL) 25 MG 24 hr tablet Take 0.5 tablets (12.5 mg total) by mouth daily. Take with or immediately following a meal. (Patient taking differently: Take 25 mg by mouth daily. Take with or immediately following a meal.)  . omeprazole (PRILOSEC) 40 MG capsule Take 1 capsule (40 mg total) by mouth daily.   No facility-administered  encounter medications on file as of 06/30/2017.      Review of Systems  Constitutional:   No  weight loss, night sweats,  Fevers, chills, + fatigue, or  lassitude.  HEENT:   No headaches,  Difficulty swallowing,  Tooth/dental problems, or  Sore throat,                No sneezing, itching, ear ache, nasal congestion, post nasal drip,   CV:  No chest pain,  Orthopnea, PND, swelling in lower extremities, anasarca, dizziness, palpitations, syncope.   GI  No heartburn, indigestion, abdominal pain, nausea, vomiting, diarrhea, change in bowel habits, loss of appetite, bloody stools.   Resp: .  No excess mucus, no productive cough,  No non-productive cough,  No coughing up of blood.  No change in color of mucus.  No wheezing.  No chest wall deformity  Skin: no rash or lesions.  GU: no dysuria, change in color of urine, no urgency or frequency.  No flank pain, no hematuria   MS:  No joint pain or swelling.  No decreased range of motion.  No back pain.    Physical Exam  BP 104/78 (BP Location: Left Arm, Cuff Size: Normal)   Pulse 60   Ht 5\' 3"  (1.6 m)   Wt 176 lb (79.8 kg)   SpO2 98%   BMI 31.18 kg/m   GEN: A/Ox3; pleasant , NAD,  obese   HEENT:  [place]/AT,  EACs-clear, TMs-wnl, NOSE-clear, THROAT-clear, no lesions, no postnasal drip or exudate noted.   NECK:  Supple w/ fair ROM; no JVD; normal carotid impulses w/o bruits; no thyromegaly or nodules palpated; no lymphadenopathy.    RESP  Clear  P & A; w/o, wheezes/ rales/ or rhonchi. no accessory muscle use, no dullness to percussion  CARD:  RRR, no m/r/g, no peripheral edema, pulses intact, no cyanosis or clubbing.  GI:   Soft & nt; nml bowel sounds; no organomegaly or masses detected.   Musco: Warm bil, no deformities or joint swelling noted.   Neuro: alert, no focal deficits noted.    Skin: Warm, no lesions or rashes    Lab Results:  CBC  No results found for: BNP  ProBNP  Imaging: No results  found.   Assessment & Plan:   Shortness of breath ? Etiology . Possible deconditioning  PFT is normal .  Extensive Workup up has been unrevealing . Could consider CPST but she does not want to proceed with additional testing right now .   Plan  Patient Instructions  Follow up in September 2019 after CT scan Dr. Kendrick Fries  CT chest 04/2018 for lung nodule  Please contact office for sooner follow up if symptoms do not improve or worsen or seek emergency care      Lung nodule Check CT chest in 1 year ~04/2018      Rubye Oaks, NP 06/30/2017

## 2017-06-30 NOTE — Patient Outreach (Signed)
[facility] Customer service manager [facility]) Care Management  06/30/2017  ELEORA BRANNING 1946/01/12 [id number]  Unsuccessful Outreach call to Ms. Griebel for Losartan Medication adherence.  Suzan Slick Ernesta Amble [facility] Care Management [phone]

## 2017-07-04 ENCOUNTER — Other Ambulatory Visit: Payer: Self-pay | Admitting: Internal Medicine

## 2017-07-17 DIAGNOSIS — M4712 Other spondylosis with myelopathy, cervical region: Secondary | ICD-10-CM | POA: Diagnosis not present

## 2017-07-17 DIAGNOSIS — Z9181 History of falling: Secondary | ICD-10-CM | POA: Diagnosis not present

## 2017-07-17 DIAGNOSIS — M47816 Spondylosis without myelopathy or radiculopathy, lumbar region: Secondary | ICD-10-CM | POA: Diagnosis not present

## 2017-07-17 DIAGNOSIS — I2510 Atherosclerotic heart disease of native coronary artery without angina pectoris: Secondary | ICD-10-CM | POA: Diagnosis not present

## 2017-07-17 DIAGNOSIS — I519 Heart disease, unspecified: Secondary | ICD-10-CM | POA: Diagnosis not present

## 2017-08-05 DIAGNOSIS — R55 Syncope and collapse: Secondary | ICD-10-CM

## 2017-08-05 HISTORY — DX: Syncope and collapse: R55

## 2017-08-15 DIAGNOSIS — R42 Dizziness and giddiness: Secondary | ICD-10-CM | POA: Diagnosis not present

## 2017-08-15 DIAGNOSIS — E039 Hypothyroidism, unspecified: Secondary | ICD-10-CM | POA: Diagnosis not present

## 2017-08-15 DIAGNOSIS — I959 Hypotension, unspecified: Secondary | ICD-10-CM | POA: Diagnosis not present

## 2017-08-15 DIAGNOSIS — Z79899 Other long term (current) drug therapy: Secondary | ICD-10-CM | POA: Diagnosis not present

## 2017-08-15 DIAGNOSIS — R001 Bradycardia, unspecified: Secondary | ICD-10-CM | POA: Diagnosis not present

## 2017-08-15 DIAGNOSIS — E860 Dehydration: Secondary | ICD-10-CM | POA: Diagnosis not present

## 2017-08-15 DIAGNOSIS — I10 Essential (primary) hypertension: Secondary | ICD-10-CM | POA: Diagnosis not present

## 2017-08-18 DIAGNOSIS — R001 Bradycardia, unspecified: Secondary | ICD-10-CM | POA: Diagnosis not present

## 2017-08-18 DIAGNOSIS — I519 Heart disease, unspecified: Secondary | ICD-10-CM | POA: Diagnosis not present

## 2017-08-18 DIAGNOSIS — R55 Syncope and collapse: Secondary | ICD-10-CM | POA: Diagnosis not present

## 2017-08-18 DIAGNOSIS — N390 Urinary tract infection, site not specified: Secondary | ICD-10-CM | POA: Diagnosis not present

## 2017-08-18 DIAGNOSIS — F419 Anxiety disorder, unspecified: Secondary | ICD-10-CM | POA: Diagnosis not present

## 2017-08-21 DIAGNOSIS — K219 Gastro-esophageal reflux disease without esophagitis: Secondary | ICD-10-CM | POA: Diagnosis not present

## 2017-08-21 DIAGNOSIS — F418 Other specified anxiety disorders: Secondary | ICD-10-CM | POA: Diagnosis not present

## 2017-08-21 DIAGNOSIS — E039 Hypothyroidism, unspecified: Secondary | ICD-10-CM | POA: Diagnosis not present

## 2017-08-21 DIAGNOSIS — I10 Essential (primary) hypertension: Secondary | ICD-10-CM | POA: Diagnosis not present

## 2017-08-21 NOTE — Progress Notes (Signed)
Cardiology Office Note   Date:  08/22/2017   ID:  Kimberly Cook, DOB 03/23/1946, MRN [id number]  PCP:  Kimberly Ishihara, MD  Cardiologist:  Dr. Okey Dupre    Chief Complaint  Patient presents with  . Loss of Consciousness    decrease of blood pressure       History of Present Illness: Kimberly Cook is a 72 y.o. female who presents for bradycardia and near syncope.   She has a history of hypertension, hyperlipidemia, "mini strokes," GERD, depression, and anxiety, who presents for follow-up of shortness of breath and lightheadedness.  I last saw Kimberly Cook in early September, at which time she continued to have episodes of significant dyspnea, as well as episodic lightheadedness.  We agreed to perform a 14-day event monitor and coronary CTA.  The event monitor was unrevealing.  The CTA showed mild to moderate disease involving the LAD.  Apical LAD had borderline positive CT-FFR at 0.79.  Coronary calcium score was 257.  Today pt presents after another episode of syncope.  She was in [facility] and felt well until she bent over to pick up an item became dizzy - grabbed the chair and sat down, was very pale,  BP per pt was down to 47/33.  She went to urgent care and due to low HR and hypotension sent to ER.  EKG with SB and inverted T waves in III and VI.  HR was 47  I do not have BPs for that time.  It took her 2 days to have her HR back along with BP.  Her losartan and toprol XL were stopped.  She also had UTI.   Since then BP is better but now more elevated.    She is worried due to hx of TIAs.  But she is very anxious now and worried that it will happen again.    She still has chest pressure and DOE with pulling trash cans out.  See above for cardiac CTA.  No cold or fevers.   She also complains of chronic Headache, she has had this before and was seen by neurology and had injection that relieved the headaches.  She does not remember the name, but was in [facility].  She does  have dx of Occipital neuralgia.  Past Medical History:  Diagnosis Date  . Allergy   . Arthritis   . Coronary artery disease 04/2017   Mild to moderate CAD in LAD/diagonal by CTA (CT-FFR of apical LAD 0.79).  . Depression   . Essential hypertension    Normal cardiolite 05/2006 EF 71%  . GERD (gastroesophageal reflux disease)   . Headache   . Hyperlipidemia   . Hypothyroidism   . Mini stroke (HCC) 2011   . Occipital neuralgia   . Prediabetes   . Urinary tract infection   . Vitamin D deficiency     Past Surgical History:  Procedure Laterality Date  . ABDOMINAL HYSTERECTOMY    . BLADDER SURGERY    . BREAST BIOPSY Right Over 20 years    Benign  . CHOLECYSTECTOMY    . gastroplication     . JOINT REPLACEMENT Left   . KNEE ARTHROSCOPY Left 2011  . TONSILLECTOMY AND ADENOIDECTOMY    . TOTAL KNEE ARTHROPLASTY Left 04/17/2015   Procedure: LEFT TOTAL KNEE ARTHROPLASTY;  Surgeon: Durene Romans, MD;  Location: [facility];  Service: Orthopedics;  Laterality: Left;     Current Outpatient Medications  Medication Sig Dispense Refill  .  amitriptyline (ELAVIL) 25 MG tablet Take 25 mg by mouth daily.    Marland Kitchen aspirin 325 MG tablet Take 325 mg by mouth daily.    Marland Kitchen atorvastatin (LIPITOR) 20 MG tablet Take 1 tablet (20 mg total) by mouth daily. 90 tablet 1  . Biotin 1 MG CAPS Take 1 mg by mouth daily.     . cephALEXin (KEFLEX) 500 MG capsule Take 500 mg by mouth 4 (four) times daily.    . Cholecalciferol (VITAMIN D3) 5000 units TABS Take 5,000 Units by mouth daily.    Marland Kitchen escitalopram (LEXAPRO) 5 MG tablet Take 5 mg by mouth daily.    . folic acid (FOLVITE) 400 MCG tablet Take 400 mcg by mouth daily.    Marland Kitchen levothyroxine (SYNTHROID, LEVOTHROID) 112 MCG tablet TAKE 1 TABLET EVERY DAY 90 tablet 1  . methocarbamol (ROBAXIN) 500 MG tablet TAKE 1 TABLET THREE TIMES DAILY AS NEEDED FOR MUSCLE SPASM(S) 90 tablet 0  . omeprazole (PRILOSEC) 40 MG capsule Take 1 capsule (40 mg total) by mouth daily. 90 capsule 1    . tiZANidine (ZANAFLEX) 4 MG tablet Take 4 mg by mouth every 6 (six) hours as needed for muscle spasms.    . traZODone (DESYREL) 50 MG tablet Take 50 mg by mouth at bedtime.     No current facility-administered medications for this visit.     Allergies:   Patient has no known allergies.    Social History:  The patient  reports that she is a non-smoker but has been exposed to tobacco smoke. she has never used smokeless tobacco. She reports that she does not drink alcohol or use drugs.   Family History:  The patient's family history includes Breast cancer in her maternal aunt; Diabetes in her mother; Heart attack in her brother; Heart attack (age of onset: 57) in her mother; Heart attack (age of onset: 13) in her father; Heart disease in her father and mother; Hypertension in her mother.    ROS:  General:no colds or fevers, no weight changes Skin:no rashes or ulcers HEENT:no blurred vision, no congestion CV:see HPI PUL:see HPI GI:no diarrhea constipation or melena, no indigestion GU:no hematuria, no dysuria MS:no joint pain, no claudication Neuro:+ near syncope, no lightheadedness Endo:no diabetes, + thyroid disease  Wt Readings from Last 3 Encounters:  08/22/17 178 lb (80.7 kg)  06/30/17 176 lb (79.8 kg)  06/03/17 174 lb 12.8 oz (79.3 kg)     PHYSICAL EXAM: VS:  BP (!) 142/80   Pulse (!) 57   Resp 16   Ht 5\' 3"  (1.6 m)   Wt 178 lb (80.7 kg)   SpO2 98%   BMI 31.53 kg/m  , BMI Body mass index is 31.53 kg/m. General:Pleasant affect, NAD Skin:Warm and dry, brisk capillary refill HEENT:normocephalic, sclera clear, mucus membranes moist Neck:supple, no JVD, no bruits  Heart:S1S2 RRR without murmur, gallup, rub or click Lungs:clear without rales, rhonchi, or wheezes ZOX:WRUE, non tender, + BS, do not palpate liver spleen or masses Ext:no lower ext edema, 2+ pedal pulses, 2+ radial pulses Neuro:alert and oriented X 3, MAE, follows commands, + facial symmetry  She did have  orthostatic hypotension in July.     EKG:  EKG is ordered today. The ekg ordered today demonstrates SB at 57 and non specific ST and T wave abnormality though overall improved from E[facility] at [facility].     Recent Labs: 01/07/2017: Magnesium 2.1; TSH 1.84 03/04/2017: Hemoglobin 14.0; Platelets 215 04/11/2017: BUN 17; Creatinine, Ser 1.02;  Potassium 4.3; Sodium 140 08/22/2017: ALT 19    Lipid Panel    Component Value Date/Time   CHOL 119 08/22/2017 0920   TRIG 81 08/22/2017 0920   HDL 57 08/22/2017 0920   CHOLHDL 2.1 08/22/2017 0920   CHOLHDL 2.0 10/08/2016 1603   VLDL 19 10/08/2016 1603   LDLCALC 46 08/22/2017 0920       Other studies Reviewed: Additional studies/ records that were reviewed today include: . Cardiac CT 04/30/17 FINDINGS: FFRct analysis was performed on the original cardiac CT angiogram dataset. Diagrammatic representation of the FFRct analysis is provided in a separate PDF document in PACS. This dictation was created using the PDF document and an interactive 3D model of the results. 3D model is not available in the EMR/PACS. Normal FFR range is >0.80.  1. Left Main:  No significant stenosis.  2. LAD: No significant stenosis in the proximal or mid LAD, very distal LAD has CT FFR 0.79. 3. D1, D2 no significant stenosis. 4. LCX: No significant stenosis. 5. RCA: No significant stenosis.  IMPRESSION: 1. CT FFR analysis showed stenosis in the very distal portion of LAD where the vessel has a small lumen. Aggressive risk factor modification is recommended.  Echo 03/20/17 Study Conclusions  - Left ventricle: The cavity size was normal. There was mild focal   basal hypertrophy of the septum. Systolic function was normal.   The estimated ejection fraction was in the range of 50% to 55%.   Wall motion was normal; there were no regional wall motion   abnormalities. Doppler parameters are consistent with abnormal   left ventricular relaxation (grade 1  diastolic dysfunction). - Mitral valve: Calcified annulus.  Impressions:  - Normal LV systolic function; mild diastolic dysfunction.  ASSESSMENT AND PLAN:  1. Syncope -discussed with Dr. Johney Frame DOD, with orthostatic hypotension in July and pt states that is what happens - her BP drops will leave off losartan and BB now, she will keep a diary of BP and bring to Dr. Okey Dupre.  No BB for now with HR of 57 today.  This recent episode may have been worse with UTI as well.    She will keep drinking fluids and I asked her to wear support stockings.  Follow up with Dr. Okey Dupre in 2-3 weeks.   2.  Chest pressure with recent cardiac CT which was reassuring.  And last year nuc study was negative.   3.   HLD on statin continue   4.  Occipital neuralgia, with increased headaches will refer to Neurology.     Current medicines are reviewed with the patient today.  The patient Has no concerns regarding medicines.  The following changes have been made:  See above Labs/ tests ordered today include:see above  Disposition:   FU:  see above  Signed, Nada Boozer, NP  08/22/2017 6:43 PM    [facility] [address] [place], [place], [place]  [address]/ [address] [place], [place] Phone: [phone]; Fax: [phone]  [phone]

## 2017-08-22 ENCOUNTER — Encounter (INDEPENDENT_AMBULATORY_CARE_PROVIDER_SITE_OTHER): Payer: Self-pay

## 2017-08-22 ENCOUNTER — Other Ambulatory Visit: Payer: Medicare HMO | Admitting: *Deleted

## 2017-08-22 ENCOUNTER — Ambulatory Visit: Payer: Medicare HMO | Admitting: Cardiology

## 2017-08-22 ENCOUNTER — Encounter: Payer: Self-pay | Admitting: Cardiology

## 2017-08-22 VITALS — BP 142/80 | HR 57 | Resp 16 | Ht 63.0 in | Wt 178.0 lb

## 2017-08-22 DIAGNOSIS — R51 Headache: Secondary | ICD-10-CM | POA: Diagnosis not present

## 2017-08-22 DIAGNOSIS — Z8673 Personal history of transient ischemic attack (TIA), and cerebral infarction without residual deficits: Secondary | ICD-10-CM

## 2017-08-22 DIAGNOSIS — R0989 Other specified symptoms and signs involving the circulatory and respiratory systems: Secondary | ICD-10-CM

## 2017-08-22 DIAGNOSIS — R55 Syncope and collapse: Secondary | ICD-10-CM

## 2017-08-22 DIAGNOSIS — R519 Headache, unspecified: Secondary | ICD-10-CM

## 2017-08-22 DIAGNOSIS — G8929 Other chronic pain: Secondary | ICD-10-CM

## 2017-08-22 DIAGNOSIS — M5481 Occipital neuralgia: Secondary | ICD-10-CM

## 2017-08-22 DIAGNOSIS — E782 Mixed hyperlipidemia: Secondary | ICD-10-CM

## 2017-08-22 DIAGNOSIS — I10 Essential (primary) hypertension: Secondary | ICD-10-CM

## 2017-08-22 LAB — ALT: ALT: 19 IU/L (ref 0–32)

## 2017-08-22 LAB — LIPID PANEL
Chol/HDL Ratio: 2.1 ratio (ref 0.0–4.4)
Cholesterol, Total: 119 mg/dL (ref 100–199)
HDL: 57 mg/dL (ref >39)
LDL Calculated: 46 mg/dL (ref 0–99)
Triglycerides: 81 mg/dL (ref 0–149)
VLDL Cholesterol Cal: 16 mg/dL (ref 5–40)

## 2017-08-22 NOTE — Patient Instructions (Addendum)
Medication Instructions:  Your physician recommends that you continue on your current medications as directed. Please refer to the Current Medication list given to you today.  Labwork: None ordered  Testing/Procedures: None ordered   You have been referred to Dr. Roda Shutters, with Neurology for headaches and history of TIA.  Follow-Up: Your physician recommends that you schedule a follow-up appointment in: 4-5 WEEKS WITH DR. END 09/11/17 2:20  Any Other Special Instructions Will Be Listed Below (If Applicable).     If you need a refill on your cardiac medications before your next appointment, please call your pharmacy.

## 2017-08-26 ENCOUNTER — Ambulatory Visit: Payer: Medicare HMO | Admitting: Neurology

## 2017-08-26 ENCOUNTER — Encounter: Payer: Self-pay | Admitting: Neurology

## 2017-08-26 VITALS — BP 147/91 | HR 77 | Ht 64.0 in | Wt 170.0 lb

## 2017-08-26 DIAGNOSIS — I25119 Atherosclerotic heart disease of native coronary artery with unspecified angina pectoris: Secondary | ICD-10-CM

## 2017-08-26 DIAGNOSIS — R55 Syncope and collapse: Secondary | ICD-10-CM | POA: Diagnosis not present

## 2017-08-26 DIAGNOSIS — I10 Essential (primary) hypertension: Secondary | ICD-10-CM | POA: Diagnosis not present

## 2017-08-26 DIAGNOSIS — G4700 Insomnia, unspecified: Secondary | ICD-10-CM | POA: Diagnosis not present

## 2017-08-26 DIAGNOSIS — F419 Anxiety disorder, unspecified: Secondary | ICD-10-CM | POA: Diagnosis not present

## 2017-08-26 DIAGNOSIS — M5481 Occipital neuralgia: Secondary | ICD-10-CM | POA: Diagnosis not present

## 2017-08-26 DIAGNOSIS — E785 Hyperlipidemia, unspecified: Secondary | ICD-10-CM | POA: Diagnosis not present

## 2017-08-26 NOTE — Patient Instructions (Signed)
-   continue the ASA and lipitor for stroke and cardiac prevention - we did occipital neuralgia and it may last days or weeks or months. If you need frequent injections, you likely need radioablation for occipital nerve. - you also have tension headache and headache related to neck pain, take tylenol for HA treatment.  - follow up with PCP and cardiology for BP management and syncope work up - relaxation, destress, good sleep are keys for HA management - follow up in 3 months.

## 2017-08-27 DIAGNOSIS — G4700 Insomnia, unspecified: Secondary | ICD-10-CM | POA: Insufficient documentation

## 2017-08-27 DIAGNOSIS — F419 Anxiety disorder, unspecified: Secondary | ICD-10-CM | POA: Insufficient documentation

## 2017-08-27 DIAGNOSIS — M5481 Occipital neuralgia: Secondary | ICD-10-CM | POA: Insufficient documentation

## 2017-08-27 DIAGNOSIS — R55 Syncope and collapse: Secondary | ICD-10-CM | POA: Insufficient documentation

## 2017-08-27 DIAGNOSIS — F32A Depression, unspecified: Secondary | ICD-10-CM | POA: Insufficient documentation

## 2017-08-27 DIAGNOSIS — F329 Major depressive disorder, single episode, unspecified: Secondary | ICD-10-CM | POA: Insufficient documentation

## 2017-08-27 NOTE — Progress Notes (Signed)
NEUROLOGY CLINIC NEW PATIENT NOTE  NAME: Kimberly Cook DOB: Jan 27, 1946 REFERRING PHYSICIAN: Lorenda Ishihara  I saw Kimberly Cook as a new consult in the neurovascular clinic today regarding headache   Chief Complaint  Patient presents with  . New Patient (Initial Visit)    Referral for chronic headache, history of TIA, 2010,Occipital neuralgia, Pt states she has headaches, and had occiptal nerve block by a MD 7 years ago lasted about a year, Pt states she has headache daily. Taken tylenol, aleve,excedrin migraine  .  HPI: Kimberly Cook is a 72 y.o. female with PMH of hypertension, hyperlipidemia, CAD, GERD, syncope, depression, and anxiety who presents as a new patient for chronic HA.   She stated that she has a long history of headache.  She was diagnosed with occipital neuralgia in 2007-2008.  She received a one-time occipital nerve block which made her headache free for a year.  Since then her headache gradually come back, could be both sides and at the back of head, upper neck, fuzzy feeling,  to 7/10, light and sound bothering her mildly.  Tylenol helps, but not able to get rid of the pain.  She has hx neck pain and neck arthritis.  She is on aspirin and Lipitor.  She admitted that she has insomnia, difficulty getting to sleep, and easily wake up.  Trazodone helps sometimes.  She lives alone, and worries about her BP which fluctuate significantly and likely the cause of her recurrent episode of syncope in [employer] [employer] when her BP was down to 47/33 and HR was 47. She worries she will have it again when she is alone at home.   Past Medical History:  Diagnosis Date  . Allergy   . Arthritis   . Coronary artery disease 04/2017   Mild to moderate CAD in LAD/diagonal by CTA (CT-FFR of apical LAD 0.79).  . Depression   . Essential hypertension    Normal cardiolite 05/2006 EF 71%  . GERD (gastroesophageal reflux disease)   . Headache   . Hyperlipidemia   .  Hypothyroidism   . Mini stroke (HCC) 2011   . Occipital neuralgia   . Prediabetes   . Stroke (HCC)   . Urinary tract infection   . Vitamin D deficiency    Past Surgical History:  Procedure Laterality Date  . ABDOMINAL HYSTERECTOMY    . BLADDER SURGERY    . BREAST BIOPSY Right Over 20 years    Benign  . CHOLECYSTECTOMY    . gastroplication     . JOINT REPLACEMENT Left   . KNEE ARTHROSCOPY Left 2011  . TONSILLECTOMY AND ADENOIDECTOMY    . TOTAL KNEE ARTHROPLASTY Left 04/17/2015   Procedure: LEFT TOTAL KNEE ARTHROPLASTY;  Surgeon: Durene Romans, MD;  Location: [facility];  Service: Orthopedics;  Laterality: Left;   Family History  Problem Relation Age of Onset  . Heart disease Mother   . Hypertension Mother   . Diabetes Mother   . Heart attack Mother 52  . Heart disease Father   . Heart attack Father 48  . Breast cancer Maternal Aunt   . Heart attack Brother    Current Outpatient Medications  Medication Sig Dispense Refill  . aspirin 325 MG tablet Take 325 mg by mouth daily.    Marland Kitchen atorvastatin (LIPITOR) 20 MG tablet Take 1 tablet (20 mg total) by mouth daily. 90 tablet 1  . Biotin 1 MG CAPS Take 1 mg by mouth daily.     Marland Kitchen  Cholecalciferol (VITAMIN D3) 5000 units TABS Take 5,000 Units by mouth daily.    Marland Kitchen escitalopram (LEXAPRO) 5 MG tablet Take 5 mg by mouth daily.    . folic acid (FOLVITE) 400 MCG tablet Take 400 mcg by mouth daily.    Marland Kitchen levothyroxine (SYNTHROID, LEVOTHROID) 112 MCG tablet TAKE 1 TABLET EVERY DAY 90 tablet 1  . omeprazole (PRILOSEC) 40 MG capsule Take 1 capsule (40 mg total) by mouth daily. 90 capsule 1  . tiZANidine (ZANAFLEX) 4 MG tablet Take 4 mg by mouth every 6 (six) hours as needed for muscle spasms.    . traZODone (DESYREL) 50 MG tablet Take 50 mg by mouth at bedtime.     No current facility-administered medications for this visit.    No Known Allergies Social History   Socioeconomic History  . Marital status: Widowed    Spouse name: Not on file  .  Number of children: Not on file  . Years of education: Not on file  . Highest education level: Not on file  Social Needs  . Financial resource strain: Not on file  . Food insecurity - worry: Not on file  . Food insecurity - inability: Not on file  . Transportation needs - medical: Not on file  . Transportation needs - non-medical: Not on file  Occupational History  . Not on file  Tobacco Use  . Smoking status: Passive Smoke Exposure - Never Smoker  . Smokeless tobacco: Never Used  . Tobacco comment: husbands and children smoked in home.   Substance and Sexual Activity  . Alcohol use: No  . Drug use: No  . Sexual activity: No  Other Topics Concern  . Not on file  Social History Narrative  . Not on file    Review of Systems Full 14 system review of systems performed and notable only for those listed, all others are neg:  Constitutional: Weight loss, fatigue Cardiovascular:  Ear/Nose/Throat:   Skin:  Eyes:   Respiratory:   Gastroitestinal:   Genitourinary:  Hematology/Lymphatic:   Endocrine:  Musculoskeletal:   Allergy/Immunology:   Neurological: Headache, dizziness Psychiatric: Depression, not enough sleep, decreased energy Sleep: Insomnia   Physical Exam  Vitals:   08/26/17 1128  BP: (!) 147/91  Pulse: 77    General - Well nourished, well developed, in no apparent distress.  Ophthalmologic - Sharp disc margins OU.  Cardiovascular - Regular rate and rhythm   Neck - supple, no nuchal rigidity, bilateral occipital neuralgia.  Mental Status -  Level of arousal and orientation to time, place, and person were intact. Language including expression, naming, repetition, comprehension, reading, and writing was assessed and found intact. Attention span and concentration were normal. Fund of Knowledge was assessed and was intact.  Cranial Nerves II - XII - II - Visual field intact OU. III, IV, VI - Extraocular movements intact. V - Facial sensation intact  bilaterally. VII - Facial movement intact bilaterally. VIII - Hearing & vestibular intact bilaterally. X - Palate elevates symmetrically. XI - Chin turning & shoulder shrug intact bilaterally. XII - Tongue protrusion intact.  Motor Strength - The patient's strength was normal in all extremities and pronator drift was absent.  Bulk was normal and fasciculations were absent.   Motor Tone - Muscle tone was assessed at the neck and appendages and was normal.  Reflexes - The patient's reflexes were normal in all extremities and she had no pathological reflexes.  Sensory - Light touch, temperature/pinprick were assessed and were normal.  Coordination - The patient had normal movements in the hands and feet with no ataxia or dysmetria.  Tremor was absent.  Gait and Station - The patient's transfers, posture, gait, station, and turns were observed as normal.   Imaging none  Lab Review none    Assessment and Plan:   In summary, Kimberly Cook is a 72 y.o. female with PMH of  hypertension, hyperlipidemia, CAD, GERD, syncope, depression, and anxiety who presents for chronic HA. She had occipital neuralgia in 2007-2008 and received occipital nerve block which made her headache free for a year.  Since then her headache gradually come back, and worse for the past one year. Exam consistent with bilateral occipital neuralgia.  She does have neck pain, insomnia, anxiety which also contributes to her chronic headache.   - continue the ASA and lipitor for stroke and cardiac prevention - we will do occipital neuralgia today and it may last days or weeks or months. If you need frequent injections, you likely need radioablation of the occipital nerve. - you also have tension headache and headache related to neck pain, take tylenol for HA treatment.  - follow up with PCP and cardiology for BP management and syncope work up - relaxation, destress, good sleep are keys for HA management - follow up in  3 months.   Thank you very much for the opportunity to participate in the care of this patient.  Please do not hesitate to call if any questions or concerns arise.  No orders of the defined types were placed in this encounter.   No orders of the defined types were placed in this encounter.   Patient Instructions  - continue the ASA and lipitor for stroke and cardiac prevention - we did occipital neuralgia and it may last days or weeks or months. If you need frequent injections, you likely need radioablation for occipital nerve. - you also have tension headache and headache related to neck pain, take tylenol for HA treatment.  - follow up with PCP and cardiology for BP management and syncope work up - relaxation, destress, good sleep are keys for HA management - follow up in 3 months.    Marvel Plan, MD PhD [facility] [address] [place], [place] [address] [phone]

## 2017-08-27 NOTE — Progress Notes (Signed)
Procedure Date:  08/26/17 Procedure: Occipital Nerve Block, bi  Pre-procedure Diagnosis: Headache  Post-procedure Diagnosis: same as above  Prior to Procedure:  Informed Consent: The risks, benefits, indications, potential complications, and alternatives were explained to the patient/family and informed consent obtained.  Attending Staff:  Marvel Plan, MD PhD Resident/Fellow: none Skin Prep: Cleansed with alcohol.  Anesthesia: 63ml Lidocaine 1% without epinephrine without added sodium bicarbonate, decadron 61ml (4mg ) Indications: bilateral occipital neuralgia. The identity of the patient was confirmed and a bedside time out was performed.  Description of Procedure: Pt's skin was prepped with alcohol and lidocaine 1% and decadron 4mg /ml was injected over the occipital ridge in a fan-like fashion with appropriate procedure bilaterally. Pt had immediate relief.  Findings: immediate HA relief  Complications: The patient tolerated the procedure well with no complications.  Specimens:none  Estimated blood loss: zero   Marvel Plan, MD PhD Stroke Neurology 08/27/2017 6:29 AM

## 2017-09-01 ENCOUNTER — Other Ambulatory Visit: Payer: Medicare HMO

## 2017-09-04 DIAGNOSIS — E785 Hyperlipidemia, unspecified: Secondary | ICD-10-CM | POA: Diagnosis not present

## 2017-09-04 DIAGNOSIS — I10 Essential (primary) hypertension: Secondary | ICD-10-CM | POA: Diagnosis not present

## 2017-09-04 DIAGNOSIS — M4712 Other spondylosis with myelopathy, cervical region: Secondary | ICD-10-CM | POA: Diagnosis not present

## 2017-09-04 DIAGNOSIS — I2510 Atherosclerotic heart disease of native coronary artery without angina pectoris: Secondary | ICD-10-CM | POA: Diagnosis not present

## 2017-09-04 DIAGNOSIS — N183 Chronic kidney disease, stage 3 (moderate): Secondary | ICD-10-CM | POA: Diagnosis not present

## 2017-09-04 DIAGNOSIS — M47816 Spondylosis without myelopathy or radiculopathy, lumbar region: Secondary | ICD-10-CM | POA: Diagnosis not present

## 2017-09-04 DIAGNOSIS — M1990 Unspecified osteoarthritis, unspecified site: Secondary | ICD-10-CM | POA: Diagnosis not present

## 2017-09-04 DIAGNOSIS — E039 Hypothyroidism, unspecified: Secondary | ICD-10-CM | POA: Diagnosis not present

## 2017-09-05 ENCOUNTER — Ambulatory Visit: Payer: Medicare HMO | Admitting: Internal Medicine

## 2017-09-05 ENCOUNTER — Encounter: Payer: Self-pay | Admitting: Internal Medicine

## 2017-09-05 VITALS — BP 128/60 | HR 67 | Wt 173.6 lb

## 2017-09-05 DIAGNOSIS — R55 Syncope and collapse: Secondary | ICD-10-CM | POA: Diagnosis not present

## 2017-09-05 DIAGNOSIS — I2510 Atherosclerotic heart disease of native coronary artery without angina pectoris: Secondary | ICD-10-CM | POA: Diagnosis not present

## 2017-09-05 DIAGNOSIS — E785 Hyperlipidemia, unspecified: Secondary | ICD-10-CM

## 2017-09-05 DIAGNOSIS — R0989 Other specified symptoms and signs involving the circulatory and respiratory systems: Secondary | ICD-10-CM | POA: Diagnosis not present

## 2017-09-05 NOTE — Patient Instructions (Addendum)
Medication Instructions:   STOP Metoprolol Succinate (Toprol)  -- If you need a refill on your cardiac medications before your next appointment, please call your pharmacy. --  Labwork: None ordered  Testing/Procedures:  Please schedule to be done in 1 week  Your physician has requested that you have a carotid duplex. This test is an ultrasound of the carotid arteries in your neck. It looks at blood flow through these arteries that supply the brain with blood. Allow one hour for this exam. There are no restrictions or special instructions.   Follow-Up: Your physician wants you to follow-up in: 4-6 weeks with Dr. Okey Dupre.     Thank you for choosing [facility]!!    Any Other Special Instructions Will Be Listed Below (If Applicable).

## 2017-09-05 NOTE — Progress Notes (Signed)
Follow-up Outpatient Visit Date: 09/05/2017  Primary Care Provider: Lorenda Ishihara, MD [address] [place] [address]  Chief Complaint: Headaches and dizziness  HPI:  Ms. Kosar is a 72 y.o. year-old female with history of hypertension, hyperlipidemia, "mini strokes," GERD, depression, and anxiety, who presents for follow-up of headaches and dizziness following recent near-syncopal episode while in [facility] Ms. Ortner was vacationing with family and became acutely lightheaded and almost passed out. She proceeded to an urgent care office and was subsequently referred to the ED due to bradycradia and hypotension. She reports that it took about 2 days for her heart rate to return to normal despite all of her medications having been held. She was also told that her blood counts were low while in the hospital.  Ms. Schooley was seen for follow-up in our office by Nada Boozer, NP, on 08/22/17. At that time, losartan and metoprolol were held. Ms. Hathaway was also referred to neurology due to occipital headaches. She underwent injections by neurology and feels like her head hurts worse than ever. Ms. Sandoval has not felt lightheaded any more but still feels "foggy." She is frustrated because she has not felt well now for almost a year.  --------------------------------------------------------------------------------------------------  Cardiovascular History & Procedures: Cardiovascular Problems:  Orthostatic lightheadedness and labile blood pressure  Dyspnea on exertion  Risk Factors:  Hypertension, hyperlipidemia, family history, sedentary lifestyle, and age greater than 73  Cath/PCI:  None  CV Surgery:  None  EP Procedures and Devices:  14-day event monitor (04/24/17): Sinus rhythm without arrhythmias.  48 hour Holter monitor (03/20/17): Predominantly sinus rhythm with occasional PACs and rare PVCs. Brief episode of blocked PACs versus 2:1 AV  block.  Non-Invasive Evaluation(s):  Coronary CTA (04/30/17): Mild to moderate coronary artery disease proximal LAD and ostium of small D2.  CT FFR of distal most LAD is 0.79.  Coronary calcium score 257.  Small left lower lobe nodule (3 mm).  TTE (03/20/17): Normal LV size with mild focal basal hypertrophy of the septum. LVEF 50-55% with normal wall motion. Grade 1 diastolic dysfunction. Mitral annular calcification present. Normal RV size and function. Normal pulmonary artery pressure.  Pharmacologic MPI (12/14/15): Low risk without ischemia or scar. LVEF 68%.  TTE (12/14/15): Normal LV size and wall thickness. LVEF 55-60%.Normal RV size and function. No significant valvular abnormalities.  Recent CV Pertinent Labs: Lab Results  Component Value Date   CHOL 119 08/22/2017   HDL 57 08/22/2017   LDLCALC 46 08/22/2017   TRIG 81 08/22/2017   CHOLHDL 2.1 08/22/2017   CHOLHDL 2.0 10/08/2016   INR 1.06 04/06/2015   K 4.3 04/11/2017   MG 2.1 01/07/2017   BUN 17 04/11/2017   CREATININE 1.02 (H) 04/11/2017   CREATININE 1.18 (H) 01/07/2017    Past medical and surgical history were reviewed and updated in EPIC.  Current Meds  Medication Sig  . aspirin 325 MG tablet Take 325 mg by mouth daily.  . Biotin 1 MG CAPS Take 1 mg by mouth daily.   . Cholecalciferol (VITAMIN D3) 5000 units TABS Take 5,000 Units by mouth daily.  Marland Kitchen escitalopram (LEXAPRO) 5 MG tablet Take 5 mg by mouth daily.  . folic acid (FOLVITE) 400 MCG tablet Take 400 mcg by mouth daily.  Marland Kitchen levothyroxine (SYNTHROID, LEVOTHROID) 112 MCG tablet TAKE 1 TABLET EVERY DAY  . omeprazole (PRILOSEC) 40 MG capsule Take 1 capsule (40 mg total) by mouth daily.  Marland Kitchen tiZANidine (ZANAFLEX) 4 MG tablet  Take 4 mg by mouth every 6 (six) hours as needed for muscle spasms.  . traZODone (DESYREL) 50 MG tablet Take 50 mg by mouth at bedtime.  . [DISCONTINUED] metoprolol succinate (TOPROL-XL) 25 MG 24 hr tablet Take 12.5 mg by mouth daily.     Allergies: Patient has no known allergies.  Social History   Socioeconomic History  . Marital status: Widowed    Spouse name: Not on file  . Number of children: Not on file  . Years of education: Not on file  . Highest education level: Not on file  Social Needs  . Financial resource strain: Not on file  . Food insecurity - worry: Not on file  . Food insecurity - inability: Not on file  . Transportation needs - medical: Not on file  . Transportation needs - non-medical: Not on file  Occupational History  . Not on file  Tobacco Use  . Smoking status: Passive Smoke Exposure - Never Smoker  . Smokeless tobacco: Never Used  . Tobacco comment: husbands and children smoked in home.   Substance and Sexual Activity  . Alcohol use: No  . Drug use: No  . Sexual activity: No  Other Topics Concern  . Not on file  Social History Narrative  . Not on file    Family History  Problem Relation Age of Onset  . Heart disease Mother   . Hypertension Mother   . Diabetes Mother   . Heart attack Mother 60  . Heart disease Father   . Heart attack Father 67  . Breast cancer Maternal Aunt   . Heart attack Brother     Review of Systems: A 12-system review of systems was performed and was negative except as noted in the HPI.  --------------------------------------------------------------------------------------------------  Physical Exam: BP 128/60   Pulse 67   Wt 173 lb 9.6 oz (78.7 kg)   SpO2 97%   BMI 29.80 kg/m   General:  Overweight woman, seated comfortably in the exam room. HEENT: No conjunctival pallor or scleral icterus. Moist mucous membranes.  OP clear. Neck: Supple without lymphadenopathy, thyromegaly, JVD, or HJR. Question soft left carotid bruit. Lungs: Normal work of breathing. Clear to auscultation bilaterally without wheezes or crackles. Heart: Regular rate and rhythm without murmurs, rubs, or gallops. Abd: Bowel sounds present. Soft, NT/ND without  hepatosplenomegaly Ext: No lower extremity edema. Radial, PT, and DP pulses are 2+ bilaterally. Skin: Warm and dry without rash.  EKG:  NSR with single blocked PAC. Otherwise, no significant abnormalities.  Lab Results  Component Value Date   WBC 5.2 03/04/2017   HGB 14.0 03/04/2017   HCT 40.4 03/04/2017   MCV 89.8 03/04/2017   PLT 215 03/04/2017    Lab Results  Component Value Date   NA 140 04/11/2017   K 4.3 04/11/2017   CL 101 04/11/2017   CO2 22 04/11/2017   BUN 17 04/11/2017   CREATININE 1.02 (H) 04/11/2017   GLUCOSE 101 (H) 04/11/2017   ALT 19 08/22/2017    Lab Results  Component Value Date   CHOL 119 08/22/2017   HDL 57 08/22/2017   LDLCALC 46 08/22/2017   TRIG 81 08/22/2017   CHOLHDL 2.1 08/22/2017    --------------------------------------------------------------------------------------------------  ASSESSMENT AND PLAN: Near syncope No further episodes since last visit. Blood pressure is normal today. Ms. Mulhearn was restarted on metoprolol succinate 12.5 mg daily by her PCP a few days ago. Given history of marked bradycardia while in [facility], we have agreed to stop  metoprolol. Ms. Johansen should continue to monitor her blood pressure at home and let us know if it is consistently elevated. Given questionable left carotid bruit on exam, we will obtain carotid Dopplers to exclude significant carotid artery stenosis. I suspect that her symptoms may be due to autonomic dysfunction. If workup is unrevealing and symptoms persist, we will consider EP consultation as Ms. Vivenzio is very concerned about the episodes.  Coronary artery disease without angina No further chest pain. Continue medical therapy and primary prevention  Hypertension Blood pressure normal. Given history of labile blood pressure, I think it is reasonable to tolerate a degree of permissive hypertension. As above, we will discontinue metoprolol. Secondary hypertension workup may need to be considered in  the future.  Hyperlipidemia LDL well-controlled. Continue current medications.  Follow-up: Return to clinic in 4-6 weeks.  Yvonne Kendall, MD 09/05/2017 12:44 PM

## 2017-09-08 ENCOUNTER — Ambulatory Visit (HOSPITAL_COMMUNITY)
Admission: RE | Admit: 2017-09-08 | Discharge: 2017-09-08 | Disposition: A | Discharge status: Home / Self Care | Payer: Medicare HMO | Source: Ambulatory Visit | Attending: Cardiology | Admitting: Cardiology

## 2017-09-08 DIAGNOSIS — E785 Hyperlipidemia, unspecified: Secondary | ICD-10-CM | POA: Insufficient documentation

## 2017-09-08 DIAGNOSIS — I6523 Occlusion and stenosis of bilateral carotid arteries: Secondary | ICD-10-CM | POA: Insufficient documentation

## 2017-09-08 DIAGNOSIS — I10 Essential (primary) hypertension: Secondary | ICD-10-CM | POA: Diagnosis not present

## 2017-09-08 DIAGNOSIS — I2510 Atherosclerotic heart disease of native coronary artery without angina pectoris: Secondary | ICD-10-CM | POA: Diagnosis not present

## 2017-09-08 DIAGNOSIS — R55 Syncope and collapse: Secondary | ICD-10-CM | POA: Insufficient documentation

## 2017-09-11 ENCOUNTER — Ambulatory Visit: Payer: Medicare HMO | Admitting: Internal Medicine

## 2017-09-11 ENCOUNTER — Telehealth: Payer: Self-pay

## 2017-09-11 NOTE — Telephone Encounter (Signed)
Spoke with patient in regards to her Carotid US and gave her the results and recommendations per Dr. Okey Dupre.  Patient stated that she "feels tired all of the time."  She verbalized understanding.

## 2017-09-24 ENCOUNTER — Telehealth: Payer: Self-pay | Admitting: Internal Medicine

## 2017-09-24 DIAGNOSIS — I10 Essential (primary) hypertension: Secondary | ICD-10-CM

## 2017-09-24 DIAGNOSIS — R0602 Shortness of breath: Secondary | ICD-10-CM

## 2017-09-24 DIAGNOSIS — I959 Hypotension, unspecified: Secondary | ICD-10-CM

## 2017-09-24 NOTE — Telephone Encounter (Signed)
Spoke with patient in regards to Dr. Serita Kyle recommendations.  She will pick up lab papers on 2/21 and verbalizes understanding.

## 2017-09-24 NOTE — Telephone Encounter (Signed)
Spoke with patient in regards to Hypotension/Hypertension.  She said that her BP is all over the place.  After her OV on 2/1, her BP was 80/54 with a HR of 55.  On last Thursday her BP was 160/140 (?)   At 4:00 am this morning it was 70/50 HR 51, although when checked this morning at 9:00 am it was 143/89, HR 49..  We stopped her Toprol at the 2/1 visit.  She has an OV on 3/18 with you.Marland Kitchen Should I try to move it up?  Please advise.Thank you

## 2017-09-24 NOTE — Telephone Encounter (Signed)
Let's arrange for Kimberly Cook to have 24 hour urine fractionated catecholamines and metanephrines, serum aldosterone/plasma renin activity, and 24-hour ambulatory blood pressure monitoring.  We will regroup in the office once these studies have been completed.  If she has significant symptoms in the meantime, she should let us know.  I agree with continuing to hold metoprolol for now.

## 2017-09-24 NOTE — Telephone Encounter (Signed)
Pt c/o BP issue: STAT if pt c/o blurred vision, one-sided weakness or slurred speech  1. What are your last 5 BP readings? 91/59  Hr   55    77/53  Hr  60   73/51   hr66   70/50  hr  51  2. Are you having any other symptoms (ex. Dizziness, headache, blurred vision, passed out)? Severe headache  3. What is your BP issue? Low

## 2017-09-25 ENCOUNTER — Other Ambulatory Visit: Payer: Medicare HMO

## 2017-09-25 ENCOUNTER — Encounter (INDEPENDENT_AMBULATORY_CARE_PROVIDER_SITE_OTHER): Payer: Self-pay

## 2017-09-29 DIAGNOSIS — R0602 Shortness of breath: Secondary | ICD-10-CM | POA: Diagnosis not present

## 2017-09-29 DIAGNOSIS — I959 Hypotension, unspecified: Secondary | ICD-10-CM | POA: Diagnosis not present

## 2017-09-29 DIAGNOSIS — I10 Essential (primary) hypertension: Secondary | ICD-10-CM | POA: Diagnosis not present

## 2017-10-01 LAB — METANEPHRINES, URINE, 24 HOUR
Metaneph Total, Ur: 66 ug/L
Metanephrines, 24H Ur: 99 ug/24 hr (ref 45–290)
Normetanephrine, 24H Ur: 161 ug/24 hr (ref 82–500)
Normetanephrine, Ur: 107 ug/L

## 2017-10-01 LAB — CATECHOLAMINES, FRACTIONATED, URINE, 24 HOUR
Dopamine , 24H Ur: 215 ug/24 hr (ref 0–510)
Dopamine, Rand Ur: 143 ug/L
Epinephrine, 24H Ur: 3 ug/24 hr (ref 0–20)
Epinephrine, Rand Ur: 2 ug/L
Norepinephrine, 24H Ur: 20 ug/24 hr (ref 0–135)
Norepinephrine, Rand Ur: 13 ug/L

## 2017-10-02 DIAGNOSIS — E039 Hypothyroidism, unspecified: Secondary | ICD-10-CM | POA: Diagnosis not present

## 2017-10-02 DIAGNOSIS — F418 Other specified anxiety disorders: Secondary | ICD-10-CM | POA: Diagnosis not present

## 2017-10-02 DIAGNOSIS — N183 Chronic kidney disease, stage 3 (moderate): Secondary | ICD-10-CM | POA: Diagnosis not present

## 2017-10-02 DIAGNOSIS — R55 Syncope and collapse: Secondary | ICD-10-CM | POA: Diagnosis not present

## 2017-10-02 DIAGNOSIS — I10 Essential (primary) hypertension: Secondary | ICD-10-CM | POA: Diagnosis not present

## 2017-10-02 LAB — ALDOSTERONE + RENIN ACTIVITY W/ RATIO
ALDOS/RENIN RATIO: 8 (ref 0.0–30.0)
ALDOSTERONE: 4.7 ng/dL (ref 0.0–30.0)
Renin: 0.588 ng/mL/hr (ref 0.167–5.380)

## 2017-10-06 NOTE — Telephone Encounter (Signed)
Spoke with patient in regards to BP concerns..   She has been taking her BP every 2 hours over the weekend and they have been 154/104, 157/103, 152/98, 143/92 and this morning 125/85;    She is scheduled to see [place] on 3/18, but is complaining of constant headaches and nervousness..   Thank you

## 2017-10-06 NOTE — Telephone Encounter (Signed)
Spoke with patient and gave her Dr. Serita Kyle recommendations.. She verbalized understanding...  She will take her BP three times per day.Marland Kitchen

## 2017-10-06 NOTE — Telephone Encounter (Signed)
BP is mildly elevated but I do not think that these readings are driving her headaches and anxiety (these have been longstanding issues for her). I recommend that she speak with her PCP about her symptoms. She should continue monitoring her BP, though it does not need to be every 2 hours. We will f/u as planned.  Yvonne Kendall, MD [facility] Pager: [phone]

## 2017-10-08 ENCOUNTER — Encounter: Payer: Self-pay | Admitting: Internal Medicine

## 2017-10-20 ENCOUNTER — Ambulatory Visit: Payer: Medicare HMO | Admitting: Internal Medicine

## 2017-10-20 ENCOUNTER — Encounter: Payer: Self-pay | Admitting: Internal Medicine

## 2017-10-20 VITALS — BP 124/86 | HR 68 | Ht 63.5 in | Wt 172.6 lb

## 2017-10-20 DIAGNOSIS — I25118 Atherosclerotic heart disease of native coronary artery with other forms of angina pectoris: Secondary | ICD-10-CM | POA: Diagnosis not present

## 2017-10-20 DIAGNOSIS — R42 Dizziness and giddiness: Secondary | ICD-10-CM | POA: Diagnosis not present

## 2017-10-20 DIAGNOSIS — R55 Syncope and collapse: Secondary | ICD-10-CM | POA: Diagnosis not present

## 2017-10-20 DIAGNOSIS — R0989 Other specified symptoms and signs involving the circulatory and respiratory systems: Secondary | ICD-10-CM | POA: Diagnosis not present

## 2017-10-20 DIAGNOSIS — E785 Hyperlipidemia, unspecified: Secondary | ICD-10-CM

## 2017-10-20 MED ORDER — ASPIRIN EC 81 MG PO TBEC: 81.0000 mg | DELAYED_RELEASE_TABLET | Freq: Every day | ORAL | 3 refills | Status: DC

## 2017-10-20 NOTE — Progress Notes (Signed)
Follow-up Outpatient Visit Date: 10/20/2017  Primary Care Provider: Renford Dills, MD [address] [place] [address]  Chief Complaint: Labile blood pressure  HPI:  Kimberly Cook is a 72 y.o. year-old female with history of hypertension, hyperlipidemia, "mini strokes," GERD, depression, and anxiety, who presents for follow-up of labile blood pressures and lightheadedness.  I last saw Kimberly Cook in early February, at which time she continued to feel "foggy" and not back to her baseline.  Due to significant bradycardia while in [place], we agreed to stop metoprolol at that visit.  We also performed carotid Dopplers, which were normal.  Due to continued labile blood pressure readings at home, we agreed to obtain serum aldosterone/plasma renin activity and urine catecholamines/metanephrines to exclude secondary hypertension.  These were both normal.  Today, Kimberly Cook reports that her blood pressure is still up and down.  When her blood pressure is quite elevated, she "just does not feel well" and experiences pounding in her head.  She occasionally will awaken at night and also feel lightheaded, which she attributes to low blood pressure.  Of note, she also takes trazodone nightly and intermittent Zanaflex for muscle spasms/cramps.  She does not feel that these are contributing to her symptoms.  She has not passed out but feels like she has gotten close on a couple of occasions.  Chronic shortness of breath is unchanged.  She still notices dyspnea when talking for extended periods or when walking more than 100 feet.  She is trying to use a stationary bike at home to help improve her stamina.  She is also concerned about occasional episodes during which it seems as though her "face is drawing."  She is concerned that these could represent mini strokes.  She did not mention them to Dr. Roda Shutters (neurology) when she saw him last.  Kimberly Cook still notes occasional chest pressure  that is not clearly exertional.  There are no associated symptoms or clear precipitants.  She denies orthopnea and edema.  Kimberly Cook happily reports that she will be living into a retirement community in [place] in the next few weeks.  She feels that this will greatly improved the stress that she has been dealing with at home.  --------------------------------------------------------------------------------------------------  Cardiovascular History & Procedures: Cardiovascular Problems:  Orthostatic lightheadedness and labile blood pressure  Dyspnea on exertion  Risk Factors:  Hypertension, hyperlipidemia, family history, sedentary lifestyle, and age greater than 23  Cath/PCI:  None  CV Surgery:  None  EP Procedures and Devices:  14-day event monitor (04/24/17): Sinus rhythm without arrhythmias.  48 hour Holter monitor (03/20/17): Predominantly sinus rhythm with occasional PACs and rare PVCs. Brief episode of blocked PACs versus 2:1 AV block.  Non-Invasive Evaluation(s):  Carotid Doppler (09/08/17): Minimal plaquing of both carotid arteries.  No significant stenosis.  Patent vertebral arteries with antegrade flow bilaterally.  Normal hemodynamics in both subclavian arteries.  Coronary CTA (04/30/17):Mild to moderate coronary artery disease proximal LAD and ostium of small D2. CT FFR of distal most LAD is 0.79. Coronary calcium score 257. Small left lower lobe nodule (3 mm).  TTE (03/20/17): Normal LV size with mild focal basal hypertrophy of the septum. LVEF 50-55% with normal wall motion. Grade 1 diastolic dysfunction. Mitral annular calcification present. Normal RV size and function. Normal pulmonary artery pressure.  Pharmacologic MPI (12/14/15): Low risk without ischemia or scar. LVEF 68%.  TTE (12/14/15): Normal LV size and wall thickness. LVEF 55-60%.Normal RV size and function. No  significant valvular abnormalities.  Recent CV Pertinent Labs: Lab Results    Component Value Date   CHOL 119 08/22/2017   HDL 57 08/22/2017   LDLCALC 46 08/22/2017   TRIG 81 08/22/2017   CHOLHDL 2.1 08/22/2017   CHOLHDL 2.0 10/08/2016   INR 1.06 04/06/2015   K 4.3 04/11/2017   MG 2.1 01/07/2017   BUN 17 04/11/2017   CREATININE 1.02 (H) 04/11/2017   CREATININE 1.18 (H) 01/07/2017    Past medical and surgical history were reviewed and updated in EPIC.  Current Meds  Medication Sig  . aspirin 325 MG tablet Take 325 mg by mouth daily.  Marland Kitchen atorvastatin (LIPITOR) 20 MG tablet Take 1 tablet (20 mg total) by mouth daily.  . Biotin 1 MG CAPS Take 1 mg by mouth daily.   . Cholecalciferol (VITAMIN D3) 5000 units TABS Take 5,000 Units by mouth daily.  Marland Kitchen escitalopram (LEXAPRO) 5 MG tablet Take 5 mg by mouth daily.  . folic acid (FOLVITE) 400 MCG tablet Take 400 mcg by mouth daily.  Marland Kitchen levothyroxine (SYNTHROID, LEVOTHROID) 112 MCG tablet Take 112 mcg by mouth daily before breakfast.  . omeprazole (PRILOSEC) 40 MG capsule Take 1 capsule (40 mg total) by mouth daily.  Marland Kitchen tiZANidine (ZANAFLEX) 4 MG tablet Take 4 mg by mouth every 6 (six) hours as needed for muscle spasms.  . traZODone (DESYREL) 50 MG tablet Take 50 mg by mouth at bedtime.  . [DISCONTINUED] levothyroxine (SYNTHROID, LEVOTHROID) 112 MCG tablet TAKE 1 TABLET EVERY DAY (Patient taking differently: Take 1 tablet by mouth once a day)    Allergies: Patient has no known allergies.  Social History   Socioeconomic History  . Marital status: Widowed    Spouse name: Not on file  . Number of children: Not on file  . Years of education: Not on file  . Highest education level: Not on file  Social Needs  . Financial resource strain: Not on file  . Food insecurity - worry: Not on file  . Food insecurity - inability: Not on file  . Transportation needs - medical: Not on file  . Transportation needs - non-medical: Not on file  Occupational History  . Not on file  Tobacco Use  . Smoking status: Passive Smoke  Exposure - Never Smoker  . Smokeless tobacco: Never Used  . Tobacco comment: husbands and children smoked in home.   Substance and Sexual Activity  . Alcohol use: No  . Drug use: No  . Sexual activity: No  Other Topics Concern  . Not on file  Social History Narrative  . Not on file    Family History  Problem Relation Age of Onset  . Heart disease Mother   . Hypertension Mother   . Diabetes Mother   . Heart attack Mother 85  . Heart disease Father   . Heart attack Father 65  . Breast cancer Maternal Aunt   . Heart attack Brother     Review of Systems: A 12-system review of systems was performed and was negative except as noted in the HPI.  --------------------------------------------------------------------------------------------------  Physical Exam: BP 124/86   Pulse 68   Ht 5' 3.5" (1.613 m)   Wt 172 lb 9.6 oz (78.3 kg)   BMI 30.10 kg/m   General: Obese woman, seated comfortably in the exam room.  She is accompanied by 1 of her sons today. HEENT: No conjunctival pallor or scleral icterus. Moist mucous membranes.  OP clear. Neck: Supple without lymphadenopathy, thyromegaly,  JVD, or HJR. Lungs: Normal work of breathing. Clear to auscultation bilaterally without wheezes or crackles. Heart: Regular rate and rhythm without murmurs, rubs, or gallops. Non-displaced PMI. Abd: Bowel sounds present. Soft, NT/ND without hepatosplenomegaly Ext: No lower extremity edema. Radial, PT, and DP pulses are 2+ bilaterally. Skin: Warm and dry without rash.    Lab Results  Component Value Date   WBC 5.2 03/04/2017   HGB 14.0 03/04/2017   HCT 40.4 03/04/2017   MCV 89.8 03/04/2017   PLT 215 03/04/2017    Lab Results  Component Value Date   NA 140 04/11/2017   K 4.3 04/11/2017   CL 101 04/11/2017   CO2 22 04/11/2017   BUN 17 04/11/2017   CREATININE 1.02 (H) 04/11/2017   GLUCOSE 101 (H) 04/11/2017   ALT 19 08/22/2017    Lab Results  Component Value Date   CHOL 119  08/22/2017   HDL 57 08/22/2017   LDLCALC 46 08/22/2017   TRIG 81 08/22/2017   CHOLHDL 2.1 08/22/2017    --------------------------------------------------------------------------------------------------  ASSESSMENT AND PLAN: Lightheadedness and near syncope Intermittent episodes still persist, though fortunately Kimberly Cook has not passed out or fallen.  She reports marked swings in her blood pressure at home and brings these readings with her today.  However, her blood pressure has been well controlled at our last 2 office visits.  Extensive workup thus far has yet to reveal an etiology.  I brought up the possibility of medication side effects, particularly trazodone and Zanaflex.  Kimberly Cook is hesitant to stop using either of these.  I encouraged her to stay well-hydrated and to discuss these medications with her PCP.  Hypertension Diastolic blood pressure is borderline elevated today but overall blood pressure is reasonably well controlled.  We will defer adding any medications at this time given episodic lightheadedness and some low blood pressure readings at home.  I advised her that she may want to decrease the frequency of home blood pressure checks, as she is now measuring 3-4 times a day.  Checking it daily or a few times a week would likely suffice and hopefully lessen her anxiety.  Coronary artery disease with stable angina Kimberly Cook has occasional bouts of atypical chest pain.  I am not entirely convinced that these are related to noncritical disease noted on her prior coronary CTA, though it is notable that the CT FFR reading in the distal most LAD was slightly positive.  We have agreed to defer additional testing at this time as well as medication changes.  We will continue with secondary prevention, including low-dose aspirin and statin therapy.  Hyperlipidemia Continue atorvastatin 20 mg daily.  Follow-up: Return to see me in the [place] office in 3  months.  Yvonne Kendall, MD 10/20/2017 9:43 AM

## 2017-10-20 NOTE — Patient Instructions (Addendum)
Medication Instructions:  Aspirin 81 mg by mouth daily   -- If you need a refill on your cardiac medications before your next appointment, please call your pharmacy. --  Labwork: None ordered  Testing/Procedures: None ordered  Follow-Up: Your physician wants you to follow-up in: 3 months with Dr. Okey Dupre-- Nicholes Rough   Thank you for choosing [facility]!!    Any Other Special Instructions Will Be Listed Below (If Applicable).  PCP Corinda Gubler [phone]

## 2017-10-21 ENCOUNTER — Encounter: Payer: Self-pay | Admitting: Internal Medicine

## 2017-10-30 ENCOUNTER — Other Ambulatory Visit: Payer: Self-pay

## 2017-10-30 ENCOUNTER — Telehealth: Payer: Self-pay | Admitting: Internal Medicine

## 2017-10-30 MED ORDER — AMLODIPINE BESYLATE 2.5 MG PO TABS: 2.5000 mg | ORAL_TABLET | Freq: Every day | ORAL | 3 refills | Status: DC

## 2017-10-30 NOTE — Telephone Encounter (Signed)
Spoke with patient who has some concerns about her BP.Kimberly Cook She has been packing lately for a move on Saturday, but has noticed her BP elevated.   The Bp has been from the 130s-160s systolic and above 90s diastolic.Kimberly Cook  Last evening after sitting awhile after packing, her BP was 166/106.  This AM at 0800 her BP was 132/91.Kimberly Cook  Her HR fluctuates with these readings anywhere from 50s to low 100s.. She is asymptomatic.Kimberly Cook  She was taking Losartan and Metoprolol ( looks like Succinate and Tartrate ) in the past months to years, but since have been discontinued..  Please advise, thank you.Kimberly Cook

## 2017-10-30 NOTE — Telephone Encounter (Signed)
Pt c/o BP issue: STAT if pt c/o blurred vision, one-sided weakness or slurred speech  1. What are your last 5 BP readings? Last night it 166/106 // this morning 134/92  2. Are you having any other symptoms (ex. Dizziness, headache, blurred vision, passed out)? Headache, fatigue   3. What is your BP issue? BP is running high this week.

## 2017-10-30 NOTE — Telephone Encounter (Signed)
These blood pressures have been noted before. I recommend that we start amlodipine 2.5 mg daily and have her f/u in the hypertension clinic in a few week for follow-up.  Yvonne Kendall, MD [facility] Pager: [phone]

## 2017-10-30 NOTE — Telephone Encounter (Signed)
Spoke to patient and gave her Dr. Serita Kyle recommendations.. She verbalized understanding.Kimberly Cook

## 2017-12-01 ENCOUNTER — Ambulatory Visit: Payer: Medicare HMO | Admitting: Neurology

## 2017-12-16 ENCOUNTER — Encounter (INDEPENDENT_AMBULATORY_CARE_PROVIDER_SITE_OTHER): Payer: Self-pay

## 2017-12-16 ENCOUNTER — Encounter: Payer: Self-pay | Admitting: Internal Medicine

## 2017-12-16 ENCOUNTER — Ambulatory Visit (INDEPENDENT_AMBULATORY_CARE_PROVIDER_SITE_OTHER): Payer: Medicare HMO | Admitting: Internal Medicine

## 2017-12-16 VITALS — BP 178/90 | HR 81 | Temp 98.2°F | Ht 63.5 in | Wt 172.8 lb

## 2017-12-16 DIAGNOSIS — E559 Vitamin D deficiency, unspecified: Secondary | ICD-10-CM

## 2017-12-16 DIAGNOSIS — E039 Hypothyroidism, unspecified: Secondary | ICD-10-CM | POA: Diagnosis not present

## 2017-12-16 DIAGNOSIS — I6529 Occlusion and stenosis of unspecified carotid artery: Secondary | ICD-10-CM | POA: Insufficient documentation

## 2017-12-16 DIAGNOSIS — N3000 Acute cystitis without hematuria: Secondary | ICD-10-CM | POA: Diagnosis not present

## 2017-12-16 DIAGNOSIS — K449 Diaphragmatic hernia without obstruction or gangrene: Secondary | ICD-10-CM | POA: Diagnosis not present

## 2017-12-16 DIAGNOSIS — I25118 Atherosclerotic heart disease of native coronary artery with other forms of angina pectoris: Secondary | ICD-10-CM

## 2017-12-16 DIAGNOSIS — M5030 Other cervical disc degeneration, unspecified cervical region: Secondary | ICD-10-CM

## 2017-12-16 DIAGNOSIS — G4700 Insomnia, unspecified: Secondary | ICD-10-CM | POA: Diagnosis not present

## 2017-12-16 DIAGNOSIS — I6523 Occlusion and stenosis of bilateral carotid arteries: Secondary | ICD-10-CM

## 2017-12-16 DIAGNOSIS — G8929 Other chronic pain: Secondary | ICD-10-CM

## 2017-12-16 DIAGNOSIS — N183 Chronic kidney disease, stage 3 unspecified: Secondary | ICD-10-CM

## 2017-12-16 DIAGNOSIS — Z1159 Encounter for screening for other viral diseases: Secondary | ICD-10-CM

## 2017-12-16 DIAGNOSIS — R0989 Other specified symptoms and signs involving the circulatory and respiratory systems: Secondary | ICD-10-CM | POA: Diagnosis not present

## 2017-12-16 DIAGNOSIS — I10 Essential (primary) hypertension: Secondary | ICD-10-CM

## 2017-12-16 DIAGNOSIS — M8580 Other specified disorders of bone density and structure, unspecified site: Secondary | ICD-10-CM

## 2017-12-16 DIAGNOSIS — N281 Cyst of kidney, acquired: Secondary | ICD-10-CM

## 2017-12-16 DIAGNOSIS — L989 Disorder of the skin and subcutaneous tissue, unspecified: Secondary | ICD-10-CM

## 2017-12-16 DIAGNOSIS — R911 Solitary pulmonary nodule: Secondary | ICD-10-CM

## 2017-12-16 DIAGNOSIS — Z13818 Encounter for screening for other digestive system disorders: Secondary | ICD-10-CM

## 2017-12-16 DIAGNOSIS — F32A Depression, unspecified: Secondary | ICD-10-CM

## 2017-12-16 DIAGNOSIS — F419 Anxiety disorder, unspecified: Secondary | ICD-10-CM

## 2017-12-16 DIAGNOSIS — F329 Major depressive disorder, single episode, unspecified: Secondary | ICD-10-CM

## 2017-12-16 LAB — URINALYSIS, ROUTINE W REFLEX MICROSCOPIC
Bilirubin Urine: NEGATIVE
Ketones, ur: NEGATIVE
Nitrite: POSITIVE — AB
Specific Gravity, Urine: 1.025 (ref 1.000–1.030)
Total Protein, Urine: NEGATIVE
Urine Glucose: NEGATIVE
Urobilinogen, UA: 0.2 (ref 0.0–1.0)
pH: 5.5 (ref 5.0–8.0)

## 2017-12-16 MED ORDER — TIZANIDINE HCL 4 MG PO TABS: 4.0000 mg | ORAL_TABLET | Freq: Two times a day (BID) | ORAL | 5 refills | Status: DC | PRN

## 2017-12-16 MED ORDER — TEMAZEPAM 15 MG PO CAPS: 15.0000 mg | ORAL_CAPSULE | Freq: Every evening | ORAL | 0 refills | Status: DC | PRN

## 2017-12-16 MED ORDER — CIPROFLOXACIN HCL 250 MG PO TABS: 250.0000 mg | ORAL_TABLET | Freq: Two times a day (BID) | ORAL | 0 refills | Status: DC

## 2017-12-16 MED ORDER — TRAZODONE HCL 50 MG PO TABS: 50.0000 mg | ORAL_TABLET | Freq: Every day | ORAL | 3 refills | Status: DC

## 2017-12-16 MED ORDER — AMLODIPINE BESYLATE 2.5 MG PO TABS: 2.5000 mg | ORAL_TABLET | Freq: Every day | ORAL | 3 refills | Status: DC

## 2017-12-16 NOTE — Patient Instructions (Addendum)
Try Restoril for sleep 1-2 pills at night as needed Stop Trazadone Cipro 250 mg 2x per day with food  Norvasc if BP is >140/90 take another dose 2.5 mg Norvasc/Amlodipine Consider dermatology and urology in the future    Insomnia Insomnia is a sleep disorder that makes it difficult to fall asleep or to stay asleep. Insomnia can cause tiredness (fatigue), low energy, difficulty concentrating, mood swings, and poor performance at work or school. There are three different ways to classify insomnia:  Difficulty falling asleep.  Difficulty staying asleep.  Waking up too early in the morning.  Any type of insomnia can be long-term (chronic) or short-term (acute). Both are common. Short-term insomnia usually lasts for three months or less. Chronic insomnia occurs at least three times a week for longer than three months. What are the causes? Insomnia may be caused by another condition, situation, or substance, such as:  Anxiety.  Certain medicines.  Gastroesophageal reflux disease (GERD) or other gastrointestinal conditions.  Asthma or other breathing conditions.  Restless legs syndrome, sleep apnea, or other sleep disorders.  Chronic pain.  Menopause. This may include hot flashes.  Stroke.  Abuse of alcohol, tobacco, or illegal drugs.  Depression.  Caffeine.  Neurological disorders, such as Alzheimer disease.  An overactive thyroid (hyperthyroidism).  The cause of insomnia may not be known. What increases the risk? Risk factors for insomnia include:  Gender. Women are more commonly affected than men.  Age. Insomnia is more common as you get older.  Stress. This may involve your professional or personal life.  Income. Insomnia is more common in people with lower income.  Lack of exercise.  Irregular work schedule or night shifts.  Traveling between different time zones.  What are the signs or symptoms? If you have insomnia, trouble falling asleep or trouble  staying asleep is the main symptom. This may lead to other symptoms, such as:  Feeling fatigued.  Feeling nervous about going to sleep.  Not feeling rested in the morning.  Having trouble concentrating.  Feeling irritable, anxious, or depressed.  How is this treated? Treatment for insomnia depends on the cause. If your insomnia is caused by an underlying condition, treatment will focus on addressing the condition. Treatment may also include:  Medicines to help you sleep.  Counseling or therapy.  Lifestyle adjustments.  Follow these instructions at home:  Take medicines only as directed by your health care provider.  Keep regular sleeping and waking hours. Avoid naps.  Keep a sleep diary to help you and your health care provider figure out what could be causing your insomnia. Include: ? When you sleep. ? When you wake up during the night. ? How well you sleep. ? How rested you feel the next day. ? Any side effects of medicines you are taking. ? What you eat and drink.  Make your bedroom a comfortable place where it is easy to fall asleep: ? Put up shades or special blackout curtains to block light from outside. ? Use a white noise machine to block noise. ? Keep the temperature cool.  Exercise regularly as directed by your health care provider. Avoid exercising right before bedtime.  Use relaxation techniques to manage stress. Ask your health care provider to suggest some techniques that may work well for you. These may include: ? Breathing exercises. ? Routines to release muscle tension. ? Visualizing peaceful scenes.  Cut back on alcohol, caffeinated beverages, and cigarettes, especially close to bedtime. These can disrupt your sleep.  Do not overeat or eat spicy foods right before bedtime. This can lead to digestive discomfort that can make it hard for you to sleep.  Limit screen use before bedtime. This includes: ? Watching TV. ? Using your smartphone, tablet,  and computer.  Stick to a routine. This can help you fall asleep faster. Try to do a quiet activity, brush your teeth, and go to bed at the same time each night.  Get out of bed if you are still awake after 15 minutes of trying to sleep. Keep the lights down, but try reading or doing a quiet activity. When you feel sleepy, go back to bed.  Make sure that you drive carefully. Avoid driving if you feel very sleepy.  Keep all follow-up appointments as directed by your health care provider. This is important. Contact a health care provider if:  You are tired throughout the day or have trouble in your daily routine due to sleepiness.  You continue to have sleep problems or your sleep problems get worse. Get help right away if:  You have serious thoughts about hurting yourself or someone else. This information is not intended to replace advice given to you by your health care provider. Make sure you discuss any questions you have with your health care provider. Document Released: 07/19/2000 Document Revised: 12/22/2015 Document Reviewed: 04/22/2014 Elsevier Interactive Patient Education  2018 [employer].  Hypertension Hypertension, commonly called high blood pressure, is when the force of blood pumping through the arteries is too strong. The arteries are the blood vessels that carry blood from the heart throughout the body. Hypertension forces the heart to work harder to pump blood and may cause arteries to become narrow or stiff. Having untreated or uncontrolled hypertension can cause heart attacks, strokes, kidney disease, and other problems. A blood pressure reading consists of a higher number over a lower number. Ideally, your blood pressure should be below 120/80. The first ("top") number is called the systolic pressure. It is a measure of the pressure in your arteries as your heart beats. The second ("bottom") number is called the diastolic pressure. It is a measure of the pressure in your  arteries as the heart relaxes. What are the causes? The cause of this condition is not known. What increases the risk? Some risk factors for high blood pressure are under your control. Others are not. Factors you can change  Smoking.  Having type 2 diabetes mellitus, high cholesterol, or both.  Not getting enough exercise or physical activity.  Being overweight.  Having too much fat, sugar, calories, or salt (sodium) in your diet.  Drinking too much alcohol. Factors that are difficult or impossible to change  Having chronic kidney disease.  Having a family history of high blood pressure.  Age. Risk increases with age.  Race. You may be at higher risk if you are African-American.  Gender. Men are at higher risk than women before age 30. After age 77, women are at higher risk than men.  Having obstructive sleep apnea.  Stress. What are the signs or symptoms? Extremely high blood pressure (hypertensive crisis) may cause:  Headache.  Anxiety.  Shortness of breath.  Nosebleed.  Nausea and vomiting.  Severe chest pain.  Jerky movements you cannot control (seizures).  How is this diagnosed? This condition is diagnosed by measuring your blood pressure while you are seated, with your arm resting on a surface. The cuff of the blood pressure monitor will be placed directly against the skin of  your upper arm at the level of your heart. It should be measured at least twice using the same arm. Certain conditions can cause a difference in blood pressure between your right and left arms. Certain factors can cause blood pressure readings to be lower or higher than normal (elevated) for a short period of time:  When your blood pressure is higher when you are in a health care provider's office than when you are at home, this is called white coat hypertension. Most people with this condition do not need medicines.  When your blood pressure is higher at home than when you are in a  health care provider's office, this is called masked hypertension. Most people with this condition may need medicines to control blood pressure.  If you have a high blood pressure reading during one visit or you have normal blood pressure with other risk factors:  You may be asked to return on a different day to have your blood pressure checked again.  You may be asked to monitor your blood pressure at home for 1 week or longer.  If you are diagnosed with hypertension, you may have other blood or imaging tests to help your health care provider understand your overall risk for other conditions. How is this treated? This condition is treated by making healthy lifestyle changes, such as eating healthy foods, exercising more, and reducing your alcohol intake. Your health care provider may prescribe medicine if lifestyle changes are not enough to get your blood pressure under control, and if:  Your systolic blood pressure is above 130.  Your diastolic blood pressure is above 80.  Your personal target blood pressure may vary depending on your medical conditions, your age, and other factors. Follow these instructions at home: Eating and drinking  Eat a diet that is high in fiber and potassium, and low in sodium, added sugar, and fat. An example eating plan is called the DASH (Dietary Approaches to Stop Hypertension) diet. To eat this way: ? Eat plenty of fresh fruits and vegetables. Try to fill half of your plate at each meal with fruits and vegetables. ? Eat whole grains, such as whole wheat pasta, brown rice, or whole grain bread. Fill about one quarter of your plate with whole grains. ? Eat or drink low-fat dairy products, such as skim milk or low-fat yogurt. ? Avoid fatty cuts of meat, processed or cured meats, and poultry with skin. Fill about one quarter of your plate with lean proteins, such as fish, chicken without skin, beans, eggs, and tofu. ? Avoid premade and processed foods. These tend  to be higher in sodium, added sugar, and fat.  Reduce your daily sodium intake. Most people with hypertension should eat less than 1,500 mg of sodium a day.  Limit alcohol intake to no more than 1 drink a day for nonpregnant women and 2 drinks a day for men. One drink equals 12 oz of beer, 5 oz of wine, or 1 oz of hard liquor. Lifestyle  Work with your health care provider to maintain a healthy body weight or to lose weight. Ask what an ideal weight is for you.  Get at least 30 minutes of exercise that causes your heart to beat faster (aerobic exercise) most days of the week. Activities may include walking, swimming, or biking.  Include exercise to strengthen your muscles (resistance exercise), such as pilates or lifting weights, as part of your weekly exercise routine. Try to do these types of exercises for 30 minutes  at least 3 days a week.  Do not use any products that contain nicotine or tobacco, such as cigarettes and e-cigarettes. If you need help quitting, ask your health care provider.  Monitor your blood pressure at home as told by your health care provider.  Keep all follow-up visits as told by your health care provider. This is important. Medicines  Take over-the-counter and prescription medicines only as told by your health care provider. Follow directions carefully. Blood pressure medicines must be taken as prescribed.  Do not skip doses of blood pressure medicine. Doing this puts you at risk for problems and can make the medicine less effective.  Ask your health care provider about side effects or reactions to medicines that you should watch for. Contact a health care provider if:  You think you are having a reaction to a medicine you are taking.  You have headaches that keep coming back (recurring).  You feel dizzy.  You have swelling in your ankles.  You have trouble with your vision. Get help right away if:  You develop a severe headache or confusion.  You  have unusual weakness or numbness.  You feel faint.  You have severe pain in your chest or abdomen.  You vomit repeatedly.  You have trouble breathing. Summary  Hypertension is when the force of blood pumping through your arteries is too strong. If this condition is not controlled, it may put you at risk for serious complications.  Your personal target blood pressure may vary depending on your medical conditions, your age, and other factors. For most people, a normal blood pressure is less than 120/80.  Hypertension is treated with lifestyle changes, medicines, or a combination of both. Lifestyle changes include weight loss, eating a healthy, low-sodium diet, exercising more, and limiting alcohol. This information is not intended to replace advice given to you by your health care provider. Make sure you discuss any questions you have with your health care provider. Document Released: 07/22/2005 Document Revised: 06/19/2016 Document Reviewed: 06/19/2016 Elsevier Interactive Patient Education  [employer].

## 2017-12-16 NOTE — Progress Notes (Signed)
Pre visit review using our clinic review tool, if applicable. No additional management support is needed unless otherwise documented below in the visit note. 

## 2017-12-16 NOTE — Progress Notes (Addendum)
Chief Complaint  Patient presents with  . New Patient (Initial Visit)   New patient with grandson  1. She c/o increased urine freq, urgency and burning tried OTC meds and cranberry and increased water intake w/o relief. H/o UTI since bladder repair surgery in 2003  She wants to hold on urology referral 2/2 finances for now 2. H/o anxiety and depression on lexapro 5 mg qd  3. H/o labile BP with highs and lows at home 170s/100s today 178/90 BP will drop low at well on norvasc 2.5 qd. Off BB Toprol XL 25 mg qd, Lis 20, Losartan 25 mg qd but BB stopped 2/2 bradycardia. She at one time was on coreg 3.125 mg bid  She reports she was tested for OSA in the past and did not have it  4. Insomnia still not sleeping on trazadone 50 mg qhs and having trouble falling alseep she wants to try another medication.  5. C/o mid chest and right lower leg lesion right lower leg lesion x months and scabbing she is not sure what it is. She wants to hold on dermatology appt 2/2 finances for now  6. Chronic back pain wants refill zanafex today  7. She reports she has lung nodule need to review prior PCP notes I cant see on any imaging in epic   Review of Systems  Constitutional: Negative for weight loss.  Eyes: Negative for blurred vision.  Respiratory: Negative for shortness of breath.   Cardiovascular: Negative for chest pain.  Gastrointestinal: Negative for abdominal pain.  Musculoskeletal: Positive for back pain and joint pain.  Skin:       +skin lesions  Neurological: Negative for headaches.  Psychiatric/Behavioral: Positive for depression. The patient has insomnia.    Past Medical History:  Diagnosis Date  . Allergy   . Arthritis   . Coronary artery disease 04/2017   Mild to moderate CAD in LAD/diagonal by CTA (CT-FFR of apical LAD 0.79).  . Depression   . Diverticulosis   . Essential hypertension    Normal cardiolite 05/2006 EF 71%  . GERD (gastroesophageal reflux disease)   . Headache   .  History of shingles   . Hyperlipidemia   . Hypothyroidism   . Mini stroke (HCC) 2011   . Occipital neuralgia   . Prediabetes   . Stroke (HCC)   . Stroke [facility])    MRI 04/2008 + left sup. frontal gyrus possibly puntate infarct   . Urinary tract infection   . Vitamin D deficiency    Past Surgical History:  Procedure Laterality Date  . ABDOMINAL HYSTERECTOMY    . BLADDER SURGERY     2003  . BREAST BIOPSY Right Over 20 years    Benign  . CHOLECYSTECTOMY    . gastroplication     . JOINT REPLACEMENT Left   . KNEE ARTHROSCOPY Left 2011  . TONSILLECTOMY AND ADENOIDECTOMY    . TOTAL KNEE ARTHROPLASTY Left 04/17/2015   Procedure: LEFT TOTAL KNEE ARTHROPLASTY;  Surgeon: Durene Romans, MD;  Location: [facility];  Service: Orthopedics;  Laterality: Left;   Family History  Problem Relation Age of Onset  . Heart disease Mother   . Hypertension Mother   . Diabetes Mother   . Heart attack Mother 20  . Heart disease Father   . Heart attack Father 57  . Breast cancer Maternal Aunt   . Heart attack Brother    Social History   Socioeconomic History  . Marital status: Widowed    Spouse  name: Not on file  . Number of children: Not on file  . Years of education: Not on file  . Highest education level: Not on file  Occupational History  . Not on file  Social Needs  . Financial resource strain: Not on file  . Food insecurity:    Worry: Not on file    Inability: Not on file  . Transportation needs:    Medical: Not on file    Non-medical: Not on file  Tobacco Use  . Smoking status: Passive Smoke Exposure - Never Smoker  . Smokeless tobacco: Never Used  . Tobacco comment: husbands and children smoked in home.   Substance and Sexual Activity  . Alcohol use: No  . Drug use: No  . Sexual activity: Never  Lifestyle  . Physical activity:    Days per week: Not on file    Minutes per session: Not on file  . Stress: Not on file  Relationships  . Social connections:    Talks on phone: Not on  file    Gets together: Not on file    Attends religious service: Not on file    Active member of club or organization: Not on file    Attends meetings of clubs or organizations: Not on file    Relationship status: Not on file  . Intimate partner violence:    Fear of current or ex partner: Not on file    Emotionally abused: Not on file    Physically abused: Not on file    Forced sexual activity: Not on file  Other Topics Concern  . Not on file  Social History Narrative   Widowed    [place]    Has kids and grandson    Current Meds  Medication Sig  . amLODipine (NORVASC) 2.5 MG tablet Take 1 tablet (2.5 mg total) by mouth daily. If BP >140/>90 take another dose  . aspirin EC 81 MG tablet Take 1 tablet (81 mg total) by mouth daily.  Marland Kitchen atorvastatin (LIPITOR) 20 MG tablet Take 1 tablet (20 mg total) by mouth daily.  . Biotin 1 MG CAPS Take 1 mg by mouth daily.   . Cholecalciferol (VITAMIN D3) 5000 units TABS Take 5,000 Units by mouth daily.  Marland Kitchen escitalopram (LEXAPRO) 5 MG tablet Take 5 mg by mouth daily.  . folic acid (FOLVITE) 400 MCG tablet Take 400 mcg by mouth daily.  Marland Kitchen levothyroxine (SYNTHROID, LEVOTHROID) 112 MCG tablet Take 112 mcg by mouth daily before breakfast.  . omeprazole (PRILOSEC) 40 MG capsule Take 1 capsule (40 mg total) by mouth daily.  Marland Kitchen tiZANidine (ZANAFLEX) 4 MG tablet Take 1 tablet (4 mg total) by mouth 2 (two) times daily as needed for muscle spasms.  . [DISCONTINUED] amLODipine (NORVASC) 2.5 MG tablet Take 1 tablet (2.5 mg total) by mouth daily.  . [DISCONTINUED] tiZANidine (ZANAFLEX) 4 MG tablet Take 4 mg by mouth every 6 (six) hours as needed for muscle spasms.  . [DISCONTINUED] traZODone (DESYREL) 50 MG tablet Take 50 mg by mouth at bedtime.  . [DISCONTINUED] traZODone (DESYREL) 50 MG tablet Take 1 tablet (50 mg total) by mouth at bedtime.   No Known Allergies Recent Results (from the past 2160 hour(s))  Aldosterone + renin activity w/ ratio      Status: None   Collection Time: 09/29/17 10:31 AM  Result Value Ref Range   ALDOSTERONE 4.7 0.0 - 30.0 ng/dL    Comment: This test was developed and its performance  characteristics determined by [facility]. It has not been cleared or approved by the Food and Drug Administration.    Renin 0.588 0.167 - 5.380 ng/mL/hr    Comment: This test was developed and its performance characteristics determined by [facility]. It has not been cleared or approved by the Food and Drug Administration.    ALDOS/RENIN RATIO 8.0 0.0 - 30.0    Comment:                          Units:      ng/dL per ng/mL/hr  Catecholamines, fractionated, Urine, 24 hour     Status: None   Collection Time: 09/29/17 11:11 AM  Result Value Ref Range   Epinephrine, Rand Ur 2 Undefined ug/L    Comment: This test was developed and its performance characteristics determined by [facility]. It has not been cleared or approved by the Food and Drug Administration.    Epinephrine, 24H Ur 3 0 - 20 ug/24 hr   Norepinephrine, Rand Ur 13 Undefined ug/L    Comment: This test was developed and its performance characteristics determined by [facility]. It has not been cleared or approved by the Food and Drug Administration.    Norepinephrine, 24H Ur 20 0 - 135 ug/24 hr   Dopamine, Rand Ur 143 Undefined ug/L    Comment: This test was developed and its performance characteristics determined by [facility]. It has not been cleared or approved by the Food and Drug Administration.    Dopamine , 24H Ur 215 0 - 510 ug/24 hr  Metanephrines, Urine, 24 hour     Status: None   Collection Time: 09/29/17 11:19 AM  Result Value Ref Range   Normetanephrine, Ur 107 Undefined ug/L    Comment: This test was developed and its performance characteristics determined by [facility]. It has not been cleared or approved by the Food and Drug Administration.    Normetanephrine, 24H Ur 161 82 - 500 ug/24 hr    Comment:      (Hypertensive) >17 years 11 months:    110 - 1050    Metaneph Total, Ur 66 Undefined ug/L    Comment: This test was developed and its performance characteristics determined by [facility]. It has not been cleared or approved by the Food and Drug Administration.    Metanephrines, 24H Ur 99 45 - 290 ug/24 hr    Comment:      (Hypertensive) >17 years 11 months:     35 -  460  Urinalysis, Routine w reflex microscopic     Status: Abnormal   Collection Time: 12/16/17  3:04 PM  Result Value Ref Range   Color, Urine YELLOW Yellow;Lt. Yellow   APPearance Sl Cloudy (A) Clear   Specific Gravity, Urine 1.025 1.000 - 1.030   pH 5.5 5.0 - 8.0   Total Protein, Urine NEGATIVE Negative   Urine Glucose NEGATIVE Negative   Ketones, ur NEGATIVE Negative   Bilirubin Urine NEGATIVE Negative   Hgb urine dipstick TRACE-INTACT (A) Negative   Urobilinogen, UA 0.2 0.0 - 1.0   Leukocytes, UA MODERATE (A) Negative   Nitrite POSITIVE (A) Negative   WBC, UA TNTC(>50/hpf) (A) 0-2/hpf   RBC / HPF 0-2/hpf 0-2/hpf   Squamous Epithelial / LPF Rare(0-4/hpf) Rare(0-4/hpf)   Bacteria, UA Many(>50/hpf) (A) None   Objective  Body mass index is 30.13 kg/m. Wt Readings from Last 3 Encounters:  12/16/17 172 lb 12.8 oz (78.4 kg)  10/20/17 172 lb 9.6 oz (78.3  kg)  09/05/17 173 lb 9.6 oz (78.7 kg)   Temp Readings from Last 3 Encounters:  12/16/17 98.2 F (36.8 C) (Oral)  03/04/17 98.1 F (36.7 C) (Oral)  01/07/17 97.5 F (36.4 C)   BP Readings from Last 3 Encounters:  12/16/17 (!) 178/90  10/20/17 124/86  09/05/17 128/60   Pulse Readings from Last 3 Encounters:  12/16/17 81  10/20/17 68  09/05/17 67    Physical Exam  Constitutional: She is oriented to person, place, and time. She appears well-developed and well-nourished. She is cooperative.  HENT:  Head: Normocephalic and atraumatic.  Mouth/Throat: Oropharynx is clear and moist and mucous membranes are normal.  Eyes: Pupils are equal, round, and reactive to light. Conjunctivae are normal.  Cardiovascular:  Normal rate, regular rhythm and normal heart sounds.  Pulmonary/Chest: Effort normal and breath sounds normal.  Neurological: She is alert and oriented to person, place, and time. Gait normal.  Skin: Skin is warm and dry. Lesion noted.     Psychiatric: She has a normal mood and affect. Her speech is normal and behavior is normal. Judgment and thought content normal. Cognition and memory are normal.  Nursing note and vitals reviewed.   Assessment   1. Labile HTN  2. H/o CAD, palpitations with h/o PAC/PVCs holter 03/20/17 and h/o bradycardia  3. C/w UTI  4. Anxiety and depression and insomnia  5. Mid chest and right lower leg skin lesion  6. HM 7. Chronic neck/back pain  8. H/o lung nodule per pt  Plan   1.  Will have her continue norvasc 2.5 mg qd but take another 2.5 mg dose if BP>140/90 at home  Labs to w/u CMET, CBC, TSH, secondary HTN w/u labs plus renal artery duplex US Pt does not have OSA she reports she has been tested in the past   2. F/u with Dr. Okey Dupre  3. UA and culture today trial of cipro rec urology to further w/u given h/o bladder repair pt wants to hold on appt for now 2/2 finances  4.  lexapro 5 mg qd  D/c trazadone 50 mg qhs  Trial of restoril 15-30 qhs prn  5. Will need dermatology in future declines for now 2/2 finances  6.  Had flu shot  Tdap need to confirm date old pcp records and [facility] pna 23 had 08/18/10 need to confirm with old pcp records  prevnar had 06/16/15  Given Rx shingrix today pt has h/o shingles   Need to get records from PCP [facility] prior PCP [facility]  (mammo had, vaccines see below, colonoscopy had)  -colonoscopy had 06/03/11 Dr. Bosie Clos diverticulosis f/u in 10 years  -mammo [facility] 04/08/17 neg  Flu shot had 03/14/17  prevnar UTD pna 23 I think due for f/u  Consider shingrix vaccine   Out of age window pap last 01/26/10 neg  dexa 04/13/15 osteopenia consider repeat in future  Check hep b/C status, vit D h/o low vit D  Lipid had 08/12/17  reviewed   7. Refilled zanaflex bid prn  8. Need to review former PCP records to see imaging with lung nodule I do not see this in epic  Per review of notes CT chest due 04/2018 need to get prior chest CT cant find in epic  Reviewed [facility] notes H/o CKD 3 Variable (low v high) BP with near syncope and bradycardia  -was on losartan 25 mg qd, lopressor XL 25 mg qd,  H/o anxiety/depression  H/o diastolic dysfunction  H/o UTI  H/o CAD CTA with mild to moderate LAD and diagonal disease    Provider: Dr. French Ana [facility]

## 2017-12-17 LAB — COMPREHENSIVE METABOLIC PANEL
ALT: 16 U/L (ref 0–35)
AST: 16 U/L (ref 0–37)
Albumin: 4.1 g/dL (ref 3.5–5.2)
Alkaline Phosphatase: 83 U/L (ref 39–117)
BUN: 17 mg/dL (ref 6–23)
CO2: 24 mEq/L (ref 19–32)
Calcium: 9.3 mg/dL (ref 8.4–10.5)
Chloride: 104 mEq/L (ref 96–112)
Creatinine, Ser: 0.91 mg/dL (ref 0.40–1.20)
GFR: 64.38 mL/min (ref >60.00)
Glucose, Bld: 110 mg/dL — ABNORMAL HIGH (ref 70–99)
Potassium: 3.7 mEq/L (ref 3.5–5.1)
Sodium: 139 mEq/L (ref 135–145)
Total Bilirubin: 0.3 mg/dL (ref 0.2–1.2)
Total Protein: 6.8 g/dL (ref 6.0–8.3)

## 2017-12-17 LAB — CBC WITH DIFFERENTIAL/PLATELET
Basophils Absolute: 0.1 10*3/uL (ref 0.0–0.1)
Basophils Relative: 1.3 % (ref 0.0–3.0)
Eosinophils Absolute: 0.1 10*3/uL (ref 0.0–0.7)
Eosinophils Relative: 1.9 % (ref 0.0–5.0)
HCT: 37.3 % (ref 36.0–46.0)
Hemoglobin: 12.9 g/dL (ref 12.0–15.0)
Lymphocytes Relative: 24.8 % (ref 12.0–46.0)
Lymphs Abs: 1.3 10*3/uL (ref 0.7–4.0)
MCHC: 34.6 g/dL (ref 30.0–36.0)
MCV: 93.3 fl (ref 78.0–100.0)
Monocytes Absolute: 0.4 10*3/uL (ref 0.1–1.0)
Monocytes Relative: 7.3 % (ref 3.0–12.0)
Neutro Abs: 3.3 10*3/uL (ref 1.4–7.7)
Neutrophils Relative %: 64.7 % (ref 43.0–77.0)
Platelets: 157 10*3/uL (ref 150.0–400.0)
RBC: 4 Mil/uL (ref 3.87–5.11)
RDW: 12.9 % (ref 11.5–15.5)
WBC: 5.1 10*3/uL (ref 4.0–10.5)

## 2017-12-17 LAB — TSH: TSH: 0.21 u[IU]/mL — ABNORMAL LOW (ref 0.35–4.50)

## 2017-12-17 LAB — VITAMIN D 25 HYDROXY (VIT D DEFICIENCY, FRACTURES): VITD: 53.34 ng/mL (ref 30.00–100.00)

## 2017-12-18 LAB — URINE CULTURE
MICRO NUMBER:: 90586223
SPECIMEN QUALITY:: ADEQUATE

## 2017-12-19 LAB — CATECHOLAMINES, FRACT, RANDOM URINE
Calc Total (E+NE) Random: 72 mcg/g cr (ref 9–74)
Creatinine, Random U: 119 mg/dL (ref 20–275)
Dopamine, Rand Ur: 313 mcg/g cr (ref 40–390)
Norepinephrine, Random U: 72 mcg/g cr — ABNORMAL HIGH (ref 7–65)

## 2017-12-22 LAB — ALDOSTERONE + RENIN ACTIVITY W/ RATIO
ALDO / PRA Ratio: 1.3 Ratio (ref 0.9–28.9)
Aldosterone: 2 ng/dL
Renin Activity: 1.57 ng/mL/h (ref 0.25–5.82)

## 2017-12-22 LAB — METANEPHRINES, PLASMA
Metanephrine, Free: 52 pg/mL (ref <=57)
Normetanephrine, Free: 140 pg/mL (ref <=148)
Total Metanephrines-Plasma: 192 pg/mL (ref <=205)

## 2017-12-22 LAB — HEPATITIS B SURFACE ANTIGEN: Hepatitis B Surface Ag: NONREACTIVE

## 2017-12-22 LAB — HEPATITIS C ANTIBODY
Hepatitis C Ab: NONREACTIVE
SIGNAL TO CUT-OFF: 0.02 (ref <1.00)

## 2017-12-22 LAB — HEPATITIS B SURFACE ANTIBODY, QUANTITATIVE: Hepatitis B-Post: <5 m[IU]/mL — ABNORMAL LOW (ref >=10)

## 2017-12-24 ENCOUNTER — Other Ambulatory Visit: Payer: Self-pay | Admitting: Internal Medicine

## 2017-12-24 ENCOUNTER — Other Ambulatory Visit: Payer: Self-pay

## 2017-12-24 DIAGNOSIS — E039 Hypothyroidism, unspecified: Secondary | ICD-10-CM

## 2017-12-24 DIAGNOSIS — N3000 Acute cystitis without hematuria: Secondary | ICD-10-CM

## 2017-12-24 MED ORDER — LEVOTHYROXINE SODIUM 100 MCG PO TABS: 100.0000 ug | ORAL_TABLET | Freq: Every day | ORAL | 0 refills | Status: DC

## 2017-12-24 MED ORDER — CIPROFLOXACIN HCL 250 MG PO TABS: 250.0000 mg | ORAL_TABLET | Freq: Two times a day (BID) | ORAL | 0 refills | Status: DC

## 2017-12-25 ENCOUNTER — Telehealth: Payer: Self-pay | Admitting: *Deleted

## 2017-12-25 NOTE — Telephone Encounter (Signed)
Copied from CRM [id number]. Topic: General - Other >> Dec 25, 2017  9:14 AM Gerrianne Scale wrote: Reason for CRM: patient has questions about a MRI that has been scheduled

## 2017-12-25 NOTE — Telephone Encounter (Signed)
Patient is awaiting insurance to approve or not approve [place].  The [place] maybe canceled if insurance does not pay for imaging.  That's all patient wanted [place] to know.

## 2017-12-26 ENCOUNTER — Ambulatory Visit
Admission: RE | Admit: 2017-12-26 | Discharge: 2017-12-26 | Disposition: A | Discharge status: Home / Self Care | Payer: Medicare HMO | Source: Ambulatory Visit | Attending: Internal Medicine | Admitting: Internal Medicine

## 2017-12-26 DIAGNOSIS — I10 Essential (primary) hypertension: Secondary | ICD-10-CM

## 2017-12-26 DIAGNOSIS — N281 Cyst of kidney, acquired: Secondary | ICD-10-CM | POA: Diagnosis not present

## 2018-01-07 ENCOUNTER — Ambulatory Visit (INDEPENDENT_AMBULATORY_CARE_PROVIDER_SITE_OTHER): Payer: Medicare HMO | Admitting: Internal Medicine

## 2018-01-07 ENCOUNTER — Encounter: Payer: Self-pay | Admitting: Internal Medicine

## 2018-01-07 VITALS — BP 162/100 | HR 73 | Temp 98.7°F | Ht 63.5 in | Wt 168.4 lb

## 2018-01-07 DIAGNOSIS — R002 Palpitations: Secondary | ICD-10-CM

## 2018-01-07 DIAGNOSIS — N3001 Acute cystitis with hematuria: Secondary | ICD-10-CM

## 2018-01-07 DIAGNOSIS — I10 Essential (primary) hypertension: Secondary | ICD-10-CM

## 2018-01-07 DIAGNOSIS — J0141 Acute recurrent pansinusitis: Secondary | ICD-10-CM | POA: Diagnosis not present

## 2018-01-07 MED ORDER — AMOXICILLIN-POT CLAVULANATE 875-125 MG PO TABS: 1.0000 | ORAL_TABLET | Freq: Two times a day (BID) | ORAL | 0 refills | Status: DC

## 2018-01-07 MED ORDER — FLUTICASONE PROPIONATE 50 MCG/ACT NA SUSP: 2.0000 | Freq: Every day | NASAL | 1 refills | Status: DC

## 2018-01-07 MED ORDER — HYDROCHLOROTHIAZIDE 12.5 MG PO CAPS: 12.5000 mg | ORAL_CAPSULE | Freq: Every day | ORAL | 2 refills | Status: DC

## 2018-01-07 MED ORDER — DILTIAZEM HCL ER 60 MG PO CP12: 60.0000 mg | ORAL_CAPSULE | Freq: Two times a day (BID) | ORAL | 0 refills | Status: DC

## 2018-01-07 NOTE — Progress Notes (Signed)
Chief Complaint  Patient presents with  . Follow-up  . Hypertension  . Sinusitis   F/u  1. C/o urinary freq and lower ab pain she took cipro for UTI prev but still with sx's. She has had freq uti since bladder surgery to tack it  2. C/o HTN uncontrolled on norvasc 2.5 bid and at times HR will be > 100 and sbp 190s. She also has h/o hypotension with BP meds see last note 3. She c/o sinus pressure throughout x 1 week nothing tried but tylenol.  Being outside makes sx's worse    Review of Systems  Constitutional: Negative for fever.  HENT: Positive for sinus pain.        +PND  Eyes: Negative for blurred vision.  Respiratory: Negative for cough.   Cardiovascular: Positive for palpitations. Negative for chest pain.  Gastrointestinal: Positive for abdominal pain.  Genitourinary: Positive for dysuria and frequency.  Skin: Negative for rash.  Neurological: Positive for headaches.  Psychiatric/Behavioral: Negative for depression.   Past Medical History:  Diagnosis Date  . Allergy   . Arthritis   . Coronary artery disease 04/2017   Mild to moderate CAD in LAD/diagonal by CTA (CT-FFR of apical LAD 0.79).  . Depression   . Diverticulosis   . Essential hypertension    Normal cardiolite 05/2006 EF 71%  . GERD (gastroesophageal reflux disease)   . Headache   . History of shingles   . Hyperlipidemia   . Hypothyroidism   . Mini stroke (HCC) 2011   . Occipital neuralgia   . Prediabetes   . Stroke (HCC)   . Stroke [facility])    MRI 04/2008 + left sup. frontal gyrus possibly puntate infarct   . Urinary tract infection   . Vitamin D deficiency    Past Surgical History:  Procedure Laterality Date  . ABDOMINAL HYSTERECTOMY    . BLADDER SURGERY     2003  . BREAST BIOPSY Right Over 20 years    Benign  . CHOLECYSTECTOMY    . gastroplication     . JOINT REPLACEMENT Left   . KNEE ARTHROSCOPY Left 2011  . TONSILLECTOMY AND ADENOIDECTOMY    . TOTAL KNEE ARTHROPLASTY Left 04/17/2015    Procedure: LEFT TOTAL KNEE ARTHROPLASTY;  Surgeon: Durene Romans, MD;  Location: [facility];  Service: Orthopedics;  Laterality: Left;   Family History  Problem Relation Age of Onset  . Heart disease Mother   . Hypertension Mother   . Diabetes Mother   . Heart attack Mother 35  . Heart disease Father   . Heart attack Father 7  . Breast cancer Maternal Aunt   . Heart attack Brother    Social History   Socioeconomic History  . Marital status: Widowed    Spouse name: Not on file  . Number of children: Not on file  . Years of education: Not on file  . Highest education level: Not on file  Occupational History  . Not on file  Social Needs  . Financial resource strain: Not on file  . Food insecurity:    Worry: Not on file    Inability: Not on file  . Transportation needs:    Medical: Not on file    Non-medical: Not on file  Tobacco Use  . Smoking status: Passive Smoke Exposure - Never Smoker  . Smokeless tobacco: Never Used  . Tobacco comment: husbands and children smoked in home.   Substance and Sexual Activity  . Alcohol use: No  .  Drug use: No  . Sexual activity: Never  Lifestyle  . Physical activity:    Days per week: Not on file    Minutes per session: Not on file  . Stress: Not on file  Relationships  . Social connections:    Talks on phone: Not on file    Gets together: Not on file    Attends religious service: Not on file    Active member of club or organization: Not on file    Attends meetings of clubs or organizations: Not on file    Relationship status: Not on file  . Intimate partner violence:    Fear of current or ex partner: Not on file    Emotionally abused: Not on file    Physically abused: Not on file    Forced sexual activity: Not on file  Other Topics Concern  . Not on file  Social History Narrative   Widowed    [place]    Has kids and grandson    Current Meds  Medication Sig  . aspirin EC 81 MG tablet Take 1 tablet (81 mg total) by  mouth daily.  Marland Kitchen atorvastatin (LIPITOR) 20 MG tablet Take 1 tablet (20 mg total) by mouth daily.  . Biotin 1 MG CAPS Take 1 mg by mouth daily.   . Cholecalciferol (VITAMIN D3) 5000 units TABS Take 5,000 Units by mouth daily.  Marland Kitchen escitalopram (LEXAPRO) 5 MG tablet Take 5 mg by mouth daily.  . folic acid (FOLVITE) 400 MCG tablet Take 400 mcg by mouth daily.  Marland Kitchen levothyroxine (SYNTHROID, LEVOTHROID) 100 MCG tablet Take 1 tablet (100 mcg total) by mouth daily before breakfast.  . omeprazole (PRILOSEC) 40 MG capsule Take 1 capsule (40 mg total) by mouth daily.  . temazepam (RESTORIL) 15 MG capsule Take 1-2 capsules (15-30 mg total) by mouth at bedtime as needed for sleep.  Marland Kitchen tiZANidine (ZANAFLEX) 4 MG tablet Take 1 tablet (4 mg total) by mouth 2 (two) times daily as needed for muscle spasms.  . [DISCONTINUED] amLODipine (NORVASC) 2.5 MG tablet Take 1 tablet (2.5 mg total) by mouth daily. If BP >140/>90 take another dose  . [DISCONTINUED] ciprofloxacin (CIPRO) 250 MG tablet Take 1 tablet (250 mg total) by mouth 2 (two) times daily.   No Known Allergies Recent Results (from the past 2160 hour(s))  Urinalysis, Routine w reflex microscopic     Status: Abnormal   Collection Time: 12/16/17  3:04 PM  Result Value Ref Range   Color, Urine YELLOW Yellow;Lt. Yellow   APPearance Sl Cloudy (A) Clear   Specific Gravity, Urine 1.025 1.000 - 1.030   pH 5.5 5.0 - 8.0   Total Protein, Urine NEGATIVE Negative   Urine Glucose NEGATIVE Negative   Ketones, ur NEGATIVE Negative   Bilirubin Urine NEGATIVE Negative   Hgb urine dipstick TRACE-INTACT (A) Negative   Urobilinogen, UA 0.2 0.0 - 1.0   Leukocytes, UA MODERATE (A) Negative   Nitrite POSITIVE (A) Negative   WBC, UA TNTC(>50/hpf) (A) 0-2/hpf   RBC / HPF 0-2/hpf 0-2/hpf   Squamous Epithelial / LPF Rare(0-4/hpf) Rare(0-4/hpf)   Bacteria, UA Many(>50/hpf) (A) None  Urine Culture     Status: Abnormal   Collection Time: 12/16/17  3:04 PM  Result Value Ref  Range   MICRO NUMBER: [id number]    SPECIMEN QUALITY: ADEQUATE    Sample Source URINE    STATUS: FINAL    ISOLATE 1: Klebsiella oxytoca (A)     Comment: Greater  than 100,000 CFU/mL of Klebsiella oxytoca      Susceptibility   Klebsiella oxytoca - URINE CULTURE, REFLEX    AMOX/CLAVULANIC 4 Sensitive     AMPICILLIN >=32 Resistant     AMPICILLIN/SULBACTAM 8 Sensitive     CEFAZOLIN* 32 Resistant      * For uncomplicated UTI caused by E. coli,K. pneumoniae or P. mirabilis: Cefazolin issusceptible if MIC <32 mcg/mL and predictssusceptible to the oral agents cefaclor, cefdinir,cefpodoxime, cefprozil, cefuroxime, cephalexinand loracarbef.    CEFEPIME <=1 Sensitive     CEFTRIAXONE <=1 Sensitive     CIPROFLOXACIN <=0.25 Sensitive     LEVOFLOXACIN <=0.12 Sensitive     ERTAPENEM <=0.5 Sensitive     GENTAMICIN <=1 Sensitive     IMIPENEM <=0.25 Sensitive     NITROFURANTOIN 32 Sensitive     PIP/TAZO <=4 Sensitive     TOBRAMYCIN <=1 Sensitive     TRIMETH/SULFA* <=20 Sensitive      * For uncomplicated UTI caused by E. coli,K. pneumoniae or P. mirabilis: Cefazolin issusceptible if MIC <32 mcg/mL and predictssusceptible to the oral agents cefaclor, cefdinir,cefpodoxime, cefprozil, cefuroxime, cephalexinand loracarbef.Legend:S = Susceptible  I = IntermediateR = Resistant  NS = Not susceptible* = Not tested  NR = Not reported**NN = See antimicrobic comments  Comprehensive metabolic panel     Status: Abnormal   Collection Time: 12/16/17  3:44 PM  Result Value Ref Range   Sodium 139 135 - 145 mEq/L   Potassium 3.7 3.5 - 5.1 mEq/L   Chloride 104 96 - 112 mEq/L   CO2 24 19 - 32 mEq/L   Glucose, Bld 110 (H) 70 - 99 mg/dL   BUN 17 6 - 23 mg/dL   Creatinine, Ser 1.70 0.40 - 1.20 mg/dL   Total Bilirubin 0.3 0.2 - 1.2 mg/dL   Alkaline Phosphatase 83 39 - 117 U/L   AST 16 0 - 37 U/L   ALT 16 0 - 35 U/L   Total Protein 6.8 6.0 - 8.3 g/dL   Albumin 4.1 3.5 - 5.2 g/dL   Calcium 9.3 8.4 - 01.7 mg/dL   GFR 49.44  >96.75 mL/min  CBC with Differential/Platelet     Status: None   Collection Time: 12/16/17  3:44 PM  Result Value Ref Range   WBC 5.1 4.0 - 10.5 K/uL   RBC 4.00 3.87 - 5.11 Mil/uL   Hemoglobin 12.9 12.0 - 15.0 g/dL   HCT 91.6 38.4 - 66.5 %   MCV 93.3 78.0 - 100.0 fl   MCHC 34.6 30.0 - 36.0 g/dL   RDW 99.3 57.0 - 17.7 %   Platelets 157.0 150.0 - 400.0 K/uL   Neutrophils Relative % 64.7 43.0 - 77.0 %   Lymphocytes Relative 24.8 12.0 - 46.0 %   Monocytes Relative 7.3 3.0 - 12.0 %   Eosinophils Relative 1.9 0.0 - 5.0 %   Basophils Relative 1.3 0.0 - 3.0 %   Neutro Abs 3.3 1.4 - 7.7 K/uL   Lymphs Abs 1.3 0.7 - 4.0 K/uL   Monocytes Absolute 0.4 0.1 - 1.0 K/uL   Eosinophils Absolute 0.1 0.0 - 0.7 K/uL   Basophils Absolute 0.1 0.0 - 0.1 K/uL  TSH     Status: Abnormal   Collection Time: 12/16/17  3:44 PM  Result Value Ref Range   TSH 0.21 (L) 0.35 - 4.50 uIU/mL  Vitamin D (25 hydroxy)     Status: None   Collection Time: 12/16/17  3:44 PM  Result Value Ref Range  VITD 53.34 30.00 - 100.00 ng/mL  Hepatitis B surface antibody     Status: Abnormal   Collection Time: 12/16/17  3:44 PM  Result Value Ref Range   Hepatitis B-Post <5 (L) > OR = 10 mIU/mL    Comment: . Patient does not have immunity to hepatitis B virus. . For additional information, please refer to http://education.[facility].com/faq/FAQ105 (This link is being provided for informational/ educational purposes only).   Hepatitis B surface antigen     Status: None   Collection Time: 12/16/17  3:44 PM  Result Value Ref Range   Hepatitis B Surface Ag NON-REACTIVE NON-REACTI  Hepatitis C antibody     Status: None   Collection Time: 12/16/17  3:44 PM  Result Value Ref Range   Hepatitis C Ab NON-REACTIVE NON-REACTI   SIGNAL TO CUT-OFF 0.02 <1.00    Comment: . HCV antibody was non-reactive. There is no laboratory  evidence of HCV infection. . In most cases, no further action is required. However, if recent HCV  exposure is suspected, a test for HCV RNA (test code 16109) is suggested. . For additional information please refer to http://education.[facility].com/faq/FAQ22v1 (This link is being provided for informational/ educational purposes only.) .   Metanephrines, plasma     Status: None   Collection Time: 12/16/17  3:44 PM  Result Value Ref Range   Metanephrine, Free 52 <=57 pg/mL    Comment: . This test was developed and its analytical performance characteristics have been determined by [facility] [place], [place]. It has not been cleared or approved by the U.S. Food and Drug Administration. This assay has been validated pursuant to the CLIA regulations and is used for clinical purposes. .    Normetanephrine, Free 140 <=148 pg/mL    Comment: . This test was developed and its analytical performance characteristics have been determined by [facility], [place]. It has not been cleared or approved by the U.S. Food and Drug Administration. This assay has been validated pursuant to the CLIA regulations and is used for clinical purposes. .    Total Metanephrines-Plasma 192 <=205 pg/mL    Comment: . For additional information, please refer to http://education.[facility].com/faq/MetFractFree (This link is being provided for informational/educatio informational/educational purposes only.) . Elevations >4-fold upper reference range: strongly suggestive of a pheochromocytoma(1). . Elevations >1- 4-fold upper reference range: significant but not diagnostic, may be due to medications or stress. Suggest running 24 hr urine fractionated metanephrines and/or serum Chromagranin A for confirmation. . Reference: . (1)Algeciras-Schimnich A et al, Plasma Chromogranin A or Urine Fractionated Metanephrines Follow-Up Testing Improves the Diagnostic Accuracy of Plasma Fractionated Metanephrines for Pheochromocytoma. The Journal  of Clinical Endocrinology # Metabolism 93(1), 91-95, 2008. . . . This test was developed and its analytical performance characteristics have been determined by [facility] [place], [place]. It has not been cleared or a pproved by the U.S. Food and Drug Administration. This assay has been validated pursuant to the CLIA regulations and is used for clinical purposes. .   Aldosterone + renin activity w/ ratio     Status: None   Collection Time: 12/16/17  3:44 PM  Result Value Ref Range   Aldosterone 2 ng/dL    Comment: . Unable to flag abnormal result(s), please refer     to reference range(s) below: . Adult Reference Ranges for Aldosterone, LC/MS/MS:     Upright  8:00 - 10:00 am    < or = 28 ng/dL     Upright  4:00 -  6:00 pm    < or = 21 ng/dL     Supine   1:61 - 09:60 am       3 - 16 ng/dL .    Renin Activity 1.57 0.25 - 5.82 ng/mL/h   ALDO / PRA Ratio 1.3 0.9 - 28.9 Ratio    Comment: . This test was developed and its analytical performance characteristics have been determined by [facility] [place], [place]. It has not been cleared or approved by the U.S. Food and Drug Administration. This assay has been validated pursuant to the CLIA regulations and is used for clinical purposes. .   Catecholamines, Fract, Random Urine     Status: Abnormal   Collection Time: 12/16/17  3:51 PM  Result Value Ref Range   Epinephrine, Rand Ur see note     Comment: Epinephrine concentration was below the sensitivity of the assay 2.0 ug/L. Therefore the result was <1.7 mcg/g creat. This result was calculated by dividing 2.0 mcg/L by the creatinine concentration in g/L. Marland Kitchen Reference Range:  2 - 16  mcg/g cr . This test was developed and its analytical performance characteristics have been determined by [facility] [place], [place]. It has not been cleared or approved by the U.S. Food and Drug Administration. This assay has  been validated pursuant to the CLIA regulations and is used for clinical purposes. .    Norepinephrine, Random U 72 (H) 7 - 65 mcg/g cr    Comment: . This test was developed and its analytical performance characteristics have been determined by [facility] [place], [place]. It has not been cleared or approved by the U.S. Food and Drug Administration. This assay has been validated pursuant to the CLIA regulations and is used for clinical purposes. .    Calc Total (E+NE) Random 72 9 - 74 mcg/g cr    Comment: . This test was developed and its analytical performance characteristics have been determined by [facility] [place], [place]. It has not been cleared or approved by the U.S. Food and Drug Administration. This assay has been validated pursuant to the CLIA regulations and is used for clinical purposes. .    Dopamine, Rand Ur 313 40 - 390 mcg/g cr    Comment: . This test was developed and its analytical performance characteristics have been determined by [facility] [place], [place]. It has not been cleared or approved by the U.S. Food and Drug Administration. This assay has been validated pursuant to the CLIA regulations and is used for clinical purposes. .    Creatinine, Random U 119 20 - 275 mg/dL   Objective  Body mass index is 29.36 kg/m. Wt Readings from Last 3 Encounters:  01/07/18 168 lb 6.4 oz (76.4 kg)  12/16/17 172 lb 12.8 oz (78.4 kg)  10/20/17 172 lb 9.6 oz (78.3 kg)   Temp Readings from Last 3 Encounters:  01/07/18 98.7 F (37.1 C) (Oral)  12/16/17 98.2 F (36.8 C) (Oral)  03/04/17 98.1 F (36.7 C) (Oral)   BP Readings from Last 3 Encounters:  01/07/18 (!) 162/100  12/16/17 (!) 178/90  10/20/17 124/86   Pulse Readings from Last 3 Encounters:  01/07/18 73  12/16/17 81  10/20/17 68    Physical Exam  Constitutional: She is oriented to person, place, and time. She appears  well-developed and well-nourished. She is cooperative.  HENT:  Head: Normocephalic and atraumatic.  Nose: Right sinus exhibits maxillary sinus tenderness  and frontal sinus tenderness. Left sinus exhibits maxillary sinus tenderness and frontal sinus tenderness.  Mouth/Throat: Oropharynx is clear and moist and mucous membranes are normal.  Eyes: Pupils are equal, round, and reactive to light. Conjunctivae are normal.  Cardiovascular: Normal rate, regular rhythm and normal heart sounds.  Pulmonary/Chest: Effort normal and breath sounds normal.  Abdominal: There is no CVA tenderness.  Neurological: She is alert and oriented to person, place, and time. Gait normal.  Skin: Skin is warm, dry and intact.  Psychiatric: She has a normal mood and affect. Her speech is normal and behavior is normal. Judgment and thought content normal. Cognition and memory are normal.  Nursing note and vitals reviewed.   Assessment   1. UTI  2. HTN and palpitations h/o PACs, PVCs No clear etiology of HTN, renal US neg. Labs noreeinephrine sl elevated but not c/w pheochromocytoma  3. Sinusitis  4. HM Plan  1. UA and culture today  Trial of augmentin if continues will rec urogyn in [place]  2. D/c norvasc 2.5 mg bid change to dilt 12 hr 60 mg bid and hctz 12.5 mg qam  F/u in 2 weeks  Log BP at home  See 5/14 note re HTN 3. Augmentin, ns, flonase, otc ah 4.  Had flu shot 03/14/17 Tdap need to confirm date old pcp records and [facility] pna 23 had 08/18/10  Will give at f/u  prevnar had 06/16/15  Given Rx shingrix pt has h/o shingles   -colonoscopy had 06/03/11 Dr. Bosie Clos diverticulosis f/u in 10 years  -mammo [facility] 04/08/17 neg  Out of age window pap last 01/26/10 neg  dexa 04/13/15 osteopenia consider repeat in future  Check hep b/C status neg will rec hep B vaccine in future vit D h/o low vit D vit D nl 12/16/17  Lipid had 08/12/17 reviewed    CT chest due 04/2018  Will need dermatology in future  Provider:  Dr. French Ana McLean-Scocuzza-[facility]

## 2018-01-07 NOTE — Patient Instructions (Addendum)
Try Nasal saline 2 sprays(over the counter)  max then flonase 2 sprays max  Take claritin or zyrtec or allegra at night  Try Dilatizem 60 mg 2x per day with hydrochlorthiazide 12.5 mg in the am  Try Diltiazem 1x per day for now if your blood pressure is high try 2x per day  Sinusitis, Adult Sinusitis is soreness and inflammation of your sinuses. Sinuses are hollow spaces in the bones around your face. Your sinuses are located:  Around your eyes.  In the middle of your forehead.  Behind your nose.  In your cheekbones.  Your sinuses and nasal passages are lined with a stringy fluid (mucus). Mucus normally drains out of your sinuses. When your nasal tissues become inflamed or swollen, the mucus can become trapped or blocked so air cannot flow through your sinuses. This allows bacteria, viruses, and funguses to grow, which leads to infection. Sinusitis can develop quickly and last for 7?10 days (acute) or for more than 12 weeks (chronic). Sinusitis often develops after a cold. What are the causes? This condition is caused by anything that creates swelling in the sinuses or stops mucus from draining, including:  Allergies.  Asthma.  Bacterial or viral infection.  Abnormally shaped bones between the nasal passages.  Nasal growths that contain mucus (nasal polyps).  Narrow sinus openings.  Pollutants, such as chemicals or irritants in the air.  A foreign object stuck in the nose.  A fungal infection. This is rare.  What increases the risk? The following factors may make you more likely to develop this condition:  Having allergies or asthma.  Having had a recent cold or respiratory tract infection.  Having structural deformities or blockages in your nose or sinuses.  Having a weak immune system.  Doing a lot of swimming or diving.  Overusing nasal sprays.  Smoking.  What are the signs or symptoms? The main symptoms of this condition are pain and a feeling of pressure  around the affected sinuses. Other symptoms include:  Upper toothache.  Earache.  Headache.  Bad breath.  Decreased sense of smell and taste.  A cough that may get worse at night.  Fatigue.  Fever.  Thick drainage from your nose. The drainage is often green and it may contain pus (purulent).  Stuffy nose or congestion.  Postnasal drip. This is when extra mucus collects in the throat or back of the nose.  Swelling and warmth over the affected sinuses.  Sore throat.  Sensitivity to light.  How is this diagnosed? This condition is diagnosed based on symptoms, a medical history, and a physical exam. To find out if your condition is acute or chronic, your health care provider may:  Look in your nose for signs of nasal polyps.  Tap over the affected sinus to check for signs of infection.  View the inside of your sinuses using an imaging device that has a light attached (endoscope).  If your health care provider suspects that you have chronic sinusitis, you may also:  Be tested for allergies.  Have a sample of mucus taken from your nose (nasal culture) and checked for bacteria.  Have a mucus sample examined to see if your sinusitis is related to an allergy.  If your sinusitis does not respond to treatment and it lasts longer than 8 weeks, you may have an MRI or CT scan to check your sinuses. These scans also help to determine how severe your infection is. In rare cases, a bone biopsy may be  done to rule out more serious types of fungal sinus disease. How is this treated? Treatment for sinusitis depends on the cause and whether your condition is chronic or acute. If a virus is causing your sinusitis, your symptoms will go away on their own within 10 days. You may be given medicines to relieve your symptoms, including:  Topical nasal decongestants. They shrink swollen nasal passages and let mucus drain from your sinuses.  Antihistamines. These drugs block inflammation  that is triggered by allergies. This can help to ease swelling in your nose and sinuses.  Topical nasal corticosteroids. These are nasal sprays that ease inflammation and swelling in your nose and sinuses.  Nasal saline washes. These rinses can help to get rid of thick mucus in your nose.  If your condition is caused by bacteria, you will be given an antibiotic medicine. If your condition is caused by a fungus, you will be given an antifungal medicine. Surgery may be needed to correct underlying conditions, such as narrow nasal passages. Surgery may also be needed to remove polyps. Follow these instructions at home: Medicines  Take, use, or apply over-the-counter and prescription medicines only as told by your health care provider. These may include nasal sprays.  If you were prescribed an antibiotic medicine, take it as told by your health care provider. Do not stop taking the antibiotic even if you start to feel better. Hydrate and Humidify  Drink enough water to keep your urine clear or pale yellow. Staying hydrated will help to thin your mucus.  Use a cool mist humidifier to keep the humidity level in your home above 50%.  Inhale steam for 10-15 minutes, 3-4 times a day or as told by your health care provider. You can do this in the bathroom while a hot shower is running.  Limit your exposure to cool or dry air. Rest  Rest as much as possible.  Sleep with your head raised (elevated).  Make sure to get enough sleep each night. General instructions  Apply a warm, moist washcloth to your face 3-4 times a day or as told by your health care provider. This will help with discomfort.  Wash your hands often with soap and water to reduce your exposure to viruses and other germs. If soap and water are not available, use hand sanitizer.  Do not smoke. Avoid being around people who are smoking (secondhand smoke).  Keep all follow-up visits as told by your health care provider. This is  important. Contact a health care provider if:  You have a fever.  Your symptoms get worse.  Your symptoms do not improve within 10 days. Get help right away if:  You have a severe headache.  You have persistent vomiting.  You have pain or swelling around your face or eyes.  You have vision problems.  You develop confusion.  Your neck is stiff.  You have trouble breathing. This information is not intended to replace advice given to you by your health care provider. Make sure you discuss any questions you have with your health care provider. Document Released: 07/22/2005 Document Revised: 03/17/2016 Document Reviewed: 05/17/2015 Elsevier Interactive Patient Education  [employer].

## 2018-01-07 NOTE — Progress Notes (Signed)
Pre visit review using our clinic review tool, if applicable. No additional management support is needed unless otherwise documented below in the visit note. 

## 2018-01-08 LAB — URINALYSIS, ROUTINE W REFLEX MICROSCOPIC
Bilirubin, UA: NEGATIVE
Glucose, UA: NEGATIVE
Ketones, UA: NEGATIVE
Nitrite, UA: NEGATIVE
Protein, UA: NEGATIVE
RBC, UA: NEGATIVE
Specific Gravity, UA: 1.02 (ref 1.005–1.030)
Urobilinogen, Ur: 0.2 mg/dL (ref 0.2–1.0)
pH, UA: 5.5 (ref 5.0–7.5)

## 2018-01-08 LAB — MICROSCOPIC EXAMINATION
Bacteria, UA: NONE SEEN
Casts: NONE SEEN /lpf

## 2018-01-09 LAB — URINE CULTURE

## 2018-01-09 NOTE — Progress Notes (Signed)
No immunizations found in [facility] 

## 2018-01-13 ENCOUNTER — Telehealth: Payer: Self-pay

## 2018-01-13 ENCOUNTER — Other Ambulatory Visit: Payer: Self-pay | Admitting: Internal Medicine

## 2018-01-13 DIAGNOSIS — G4700 Insomnia, unspecified: Secondary | ICD-10-CM

## 2018-01-13 MED ORDER — TEMAZEPAM 30 MG PO CAPS: 30.0000 mg | ORAL_CAPSULE | Freq: Every evening | ORAL | 2 refills | Status: DC | PRN

## 2018-01-13 NOTE — Telephone Encounter (Signed)
Copied from CRM [id number]. Topic: General - Other >> Jan 13, 2018  9:16 AM Yvonna Alanis wrote: Reason for CRM: Patient wants Dr. French Ana to know that the Temazepam (RESTORIL) 15 MG capsule is working. Patient states that she is taking two a day.       Thank You!!!

## 2018-01-13 NOTE — Telephone Encounter (Signed)
Copied from CRM #[id number]. Topic: Inquiry °>> Jan 13, 2018  9:13 AM Robinson, Andra M wrote: °Reason for CRM: Patient called requesting a refill of Temazepam (RESTORIL) 15 MG capsule. Patient's preferred pharmacy is [facility] - [place], [place] - [address] [phone] (Phone) °[phone] (Fax).       Thank You!!! ° ° ° ° ° °

## 2018-01-13 NOTE — Telephone Encounter (Signed)
Copied from CRM [id number]. Topic: Inquiry >> Jan 13, 2018  9:13 AM Yvonna Alanis wrote: Reason for CRM: Patient called requesting a refill of Temazepam (RESTORIL) 15 MG capsule. Patient's preferred pharmacy is [facility] [address] - [place], [place] - [address] [phone] (Phone) [phone] (Fax).       Thank You!!!

## 2018-01-13 NOTE — Telephone Encounter (Signed)
LOV  01/07/18  Dr. McLean-Scocuzza Last refill  12/16/17  # 60 with 0 refill

## 2018-01-13 NOTE — Telephone Encounter (Signed)
Advise sent to [facility]  30 mg (1 pill at night) as needed for sleep 3 month supply #30 RF x 2   TMS

## 2018-01-19 ENCOUNTER — Encounter: Payer: Self-pay | Admitting: Internal Medicine

## 2018-01-19 ENCOUNTER — Ambulatory Visit (INDEPENDENT_AMBULATORY_CARE_PROVIDER_SITE_OTHER): Payer: Medicare HMO | Admitting: Internal Medicine

## 2018-01-19 VITALS — BP 124/80 | HR 81 | Temp 98.5°F | Ht 63.5 in | Wt 167.0 lb

## 2018-01-19 DIAGNOSIS — I491 Atrial premature depolarization: Secondary | ICD-10-CM | POA: Insufficient documentation

## 2018-01-19 DIAGNOSIS — I10 Essential (primary) hypertension: Secondary | ICD-10-CM | POA: Diagnosis not present

## 2018-01-19 DIAGNOSIS — I493 Ventricular premature depolarization: Secondary | ICD-10-CM | POA: Diagnosis not present

## 2018-01-19 DIAGNOSIS — R002 Palpitations: Secondary | ICD-10-CM | POA: Diagnosis not present

## 2018-01-19 DIAGNOSIS — Z23 Encounter for immunization: Secondary | ICD-10-CM | POA: Diagnosis not present

## 2018-01-19 MED ORDER — DILTIAZEM HCL 30 MG PO TABS: 30.0000 mg | ORAL_TABLET | Freq: Every day | ORAL | 0 refills | Status: DC

## 2018-01-19 MED ORDER — DILTIAZEM HCL ER 60 MG PO CP12: 60.0000 mg | ORAL_CAPSULE | ORAL | 0 refills | Status: DC

## 2018-01-19 NOTE — Patient Instructions (Addendum)
Take 60 extended release in am and 30 immediate release in the pm  Call me back in 2 weeks with log of blood pressure and heart rate please  F/u in 6-8 weeks Take care   Pneumococcal Polysaccharide Vaccine: What You Need to Know 1. Why get vaccinated? Vaccination can protect older adults (and some children and younger adults) from pneumococcal disease. Pneumococcal disease is caused by bacteria that can spread from person to person through close contact. It can cause ear infections, and it can also lead to more serious infections of the:  Lungs (pneumonia),  Blood (bacteremia), and  Covering of the brain and spinal cord (meningitis). Meningitis can cause deafness and brain damage, and it can be fatal.  Anyone can get pneumococcal disease, but children under 48 years of age, people with certain medical conditions, adults over 59 years of age, and cigarette smokers are at the highest risk. About 18,000 older adults die each year from pneumococcal disease in the [place]. Treatment of pneumococcal infections with penicillin and other drugs used to be more effective. But some strains of the disease have become resistant to these drugs. This makes prevention of the disease, through vaccination, even more important. 2. Pneumococcal polysaccharide vaccine (PPSV23) Pneumococcal polysaccharide vaccine (PPSV23) protects against 23 types of pneumococcal bacteria. It will not prevent all pneumococcal disease. PPSV23 is recommended for:  All adults 28 years of age and older,  Anyone 2 through 73 years of age with certain long-term health problems,  Anyone 2 through 73 years of age with a weakened immune system,  Adults 76 through 73 years of age who smoke cigarettes or have asthma.  Most people need only one dose of PPSV. A second dose is recommended for certain high-risk groups. People 21 and older should get a dose even if they have gotten one or more doses of the vaccine before they turned  65. Your healthcare provider can give you more information about these recommendations. Most healthy adults develop protection within 2 to 3 weeks of getting the shot. 3. Some people should not get this vaccine  Anyone who has had a life-threatening allergic reaction to PPSV should not get another dose.  Anyone who has a severe allergy to any component of PPSV should not receive it. Tell your provider if you have any severe allergies.  Anyone who is moderately or severely ill when the shot is scheduled may be asked to wait until they recover before getting the vaccine. Someone with a mild illness can usually be vaccinated.  Children less than 77 years of age should not receive this vaccine.  There is no evidence that PPSV is harmful to either a pregnant woman or to her fetus. However, as a precaution, women who need the vaccine should be vaccinated before becoming pregnant, if possible. 4. Risks of a vaccine reaction With any medicine, including vaccines, there is a chance of side effects. These are usually mild and go away on their own, but serious reactions are also possible. About half of people who get PPSV have mild side effects, such as redness or pain where the shot is given, which go away within about two days. Less than 1 out of 100 people develop a fever, muscle aches, or more severe local reactions. Problems that could happen after any vaccine:  People sometimes faint after a medical procedure, including vaccination. Sitting or lying down for about 15 minutes can help prevent fainting, and injuries caused by a fall. Tell your doctor if  you feel dizzy, or have vision changes or ringing in the ears.  Some people get severe pain in the shoulder and have difficulty moving the arm where a shot was given. This happens very rarely.  Any medication can cause a severe allergic reaction. Such reactions from a vaccine are very rare, estimated at about 1 in a million doses, and would happen  within a few minutes to a few hours after the vaccination. As with any medicine, there is a very remote chance of a vaccine causing a serious injury or death. The safety of vaccines is always being monitored. For more information, visit: http://floyd.org/ 5. What if there is a serious reaction? What should I look for? Look for anything that concerns you, such as signs of a severe allergic reaction, very high fever, or unusual behavior. Signs of a severe allergic reaction can include hives, swelling of the face and throat, difficulty breathing, a fast heartbeat, dizziness, and weakness. These would usually start a few minutes to a few hours after the vaccination. What should I do? If you think it is a severe allergic reaction or other emergency that can't wait, call 9-1-1 or get to the nearest hospital. Otherwise, call your doctor. Afterward, the reaction should be reported to the Vaccine Adverse Event Reporting System (VAERS). Your doctor might file this report, or you can do it yourself through the VAERS web site at www.vaers.LAgents.no, or by calling 1-417-174-7703. VAERS does not give medical advice. 6. How can I learn more?  Ask your doctor. He or she can give you the vaccine package insert or suggest other sources of information.  Call your local or state health department.  Contact the Centers for Disease Control and Prevention (CDC): ? Call 772-019-5222 (1-800-CDC-INFO) or ? Visit CDC's website at PicCapture.uy CDC Pneumococcal Polysaccharide Vaccine VIS (11/26/13) This information is not intended to replace advice given to you by your health care provider. Make sure you discuss any questions you have with your health care provider. Document Released: 05/19/2006 Document Revised: 04/11/2016 Document Reviewed: 04/11/2016 Elsevier Interactive Patient Education  2017 [employer].

## 2018-01-19 NOTE — Progress Notes (Signed)
Patient does not have any immunizations in record.

## 2018-01-19 NOTE — Progress Notes (Signed)
Chief Complaint  Patient presents with  . Follow-up   F/u 1. HTN and palpitations HR at home 80s-120s-150s-160s/60s-90s and HR 51 to 110 on diltiazem SR 60 mg she was taking 1x per day though direction rec bid. She takes at 10 am  2. Sinus issues improved today   Review of Systems  Constitutional: Negative for weight loss.  HENT: Negative for hearing loss and sinus pain.   Eyes: Negative for blurred vision.  Respiratory: Negative for shortness of breath.   Cardiovascular: Positive for palpitations. Negative for chest pain.  Skin: Negative for rash.  Psychiatric/Behavioral: Negative for depression.   Past Medical History:  Diagnosis Date  . Allergy   . Arthritis   . Coronary artery disease 04/2017   Mild to moderate CAD in LAD/diagonal by CTA (CT-FFR of apical LAD 0.79).  . Depression   . Diverticulosis   . Essential hypertension    Normal cardiolite 05/2006 EF 71%  . GERD (gastroesophageal reflux disease)   . Headache   . History of shingles   . Hyperlipidemia   . Hypothyroidism   . Mini stroke (HCC) 2011   . Occipital neuralgia   . Prediabetes   . Stroke (HCC)   . Stroke [facility])    MRI 04/2008 + left sup. frontal gyrus possibly puntate infarct   . Urinary tract infection   . Vitamin D deficiency    Past Surgical History:  Procedure Laterality Date  . ABDOMINAL HYSTERECTOMY    . BLADDER SURGERY     2003  . BREAST BIOPSY Right Over 20 years    Benign  . CHOLECYSTECTOMY    . gastroplication     . JOINT REPLACEMENT Left   . KNEE ARTHROSCOPY Left 2011  . TONSILLECTOMY AND ADENOIDECTOMY    . TOTAL KNEE ARTHROPLASTY Left 04/17/2015   Procedure: LEFT TOTAL KNEE ARTHROPLASTY;  Surgeon: Durene Romans, MD;  Location: [facility];  Service: Orthopedics;  Laterality: Left;   Family History  Problem Relation Age of Onset  . Heart disease Mother   . Hypertension Mother   . Diabetes Mother   . Heart attack Mother 26  . Heart disease Father   . Heart attack Father 11  . Breast  cancer Maternal Aunt   . Heart attack Brother    Social History   Socioeconomic History  . Marital status: Widowed    Spouse name: Not on file  . Number of children: Not on file  . Years of education: Not on file  . Highest education level: Not on file  Occupational History  . Not on file  Social Needs  . Financial resource strain: Not on file  . Food insecurity:    Worry: Not on file    Inability: Not on file  . Transportation needs:    Medical: Not on file    Non-medical: Not on file  Tobacco Use  . Smoking status: Passive Smoke Exposure - Never Smoker  . Smokeless tobacco: Never Used  . Tobacco comment: husbands and children smoked in home.   Substance and Sexual Activity  . Alcohol use: No  . Drug use: No  . Sexual activity: Never  Lifestyle  . Physical activity:    Days per week: Not on file    Minutes per session: Not on file  . Stress: Not on file  Relationships  . Social connections:    Talks on phone: Not on file    Gets together: Not on file    Attends religious service:  Not on file    Active member of club or organization: Not on file    Attends meetings of clubs or organizations: Not on file    Relationship status: Not on file  . Intimate partner violence:    Fear of current or ex partner: Not on file    Emotionally abused: Not on file    Physically abused: Not on file    Forced sexual activity: Not on file  Other Topics Concern  . Not on file  Social History Narrative   Widowed    [place]    Has kids and grandson    Current Meds  Medication Sig  . aspirin EC 81 MG tablet Take 1 tablet (81 mg total) by mouth daily.  Marland Kitchen atorvastatin (LIPITOR) 20 MG tablet Take 1 tablet (20 mg total) by mouth daily.  . Biotin 1 MG CAPS Take 1 mg by mouth daily.   . Cholecalciferol (VITAMIN D3) 5000 units TABS Take 5,000 Units by mouth daily.  Marland Kitchen diltiazem (CARDIZEM SR) 60 MG 12 hr capsule Take 1 capsule (60 mg total) by mouth every morning.  .  escitalopram (LEXAPRO) 5 MG tablet Take 5 mg by mouth daily.  . folic acid (FOLVITE) 400 MCG tablet Take 400 mcg by mouth daily.  . hydrochlorothiazide (MICROZIDE) 12.5 MG capsule Take 1 capsule (12.5 mg total) by mouth daily. In am  . levothyroxine (SYNTHROID, LEVOTHROID) 100 MCG tablet Take 1 tablet (100 mcg total) by mouth daily before breakfast.  . omeprazole (PRILOSEC) 40 MG capsule Take 1 capsule (40 mg total) by mouth daily.  . temazepam (RESTORIL) 30 MG capsule Take 1 capsule (30 mg total) by mouth at bedtime as needed for sleep.  Marland Kitchen tiZANidine (ZANAFLEX) 4 MG tablet Take 1 tablet (4 mg total) by mouth 2 (two) times daily as needed for muscle spasms.  . [DISCONTINUED] diltiazem (CARDIZEM SR) 60 MG 12 hr capsule Take 1 capsule (60 mg total) by mouth 2 (two) times daily. (Patient taking differently: Take 60 mg by mouth daily. )   No Known Allergies Recent Results (from the past 2160 hour(s))  Urinalysis, Routine w reflex microscopic     Status: Abnormal   Collection Time: 12/16/17  3:04 PM  Result Value Ref Range   Color, Urine YELLOW Yellow;Lt. Yellow   APPearance Sl Cloudy (A) Clear   Specific Gravity, Urine 1.025 1.000 - 1.030   pH 5.5 5.0 - 8.0   Total Protein, Urine NEGATIVE Negative   Urine Glucose NEGATIVE Negative   Ketones, ur NEGATIVE Negative   Bilirubin Urine NEGATIVE Negative   Hgb urine dipstick TRACE-INTACT (A) Negative   Urobilinogen, UA 0.2 0.0 - 1.0   Leukocytes, UA MODERATE (A) Negative   Nitrite POSITIVE (A) Negative   WBC, UA TNTC(>50/hpf) (A) 0-2/hpf   RBC / HPF 0-2/hpf 0-2/hpf   Squamous Epithelial / LPF Rare(0-4/hpf) Rare(0-4/hpf)   Bacteria, UA Many(>50/hpf) (A) None  Urine Culture     Status: Abnormal   Collection Time: 12/16/17  3:04 PM  Result Value Ref Range   MICRO NUMBER: [id number]    SPECIMEN QUALITY: ADEQUATE    Sample Source URINE    STATUS: FINAL    ISOLATE 1: Klebsiella oxytoca (A)     Comment: Greater than 100,000 CFU/mL of Klebsiella  oxytoca      Susceptibility   Klebsiella oxytoca - URINE CULTURE, REFLEX    AMOX/CLAVULANIC 4 Sensitive     AMPICILLIN >=32 Resistant     AMPICILLIN/SULBACTAM 8  Sensitive     CEFAZOLIN 32 Resistant      For uncomplicated UTI caused by E. coli,K. pneumoniae or P. mirabilis: Cefazolin issusceptible if MIC <32 mcg/mL and predictssusceptible to the oral agents cefaclor, cefdinir,cefpodoxime, cefprozil, cefuroxime, cephalexinand loracarbef.    CEFEPIME <=1 Sensitive     CEFTRIAXONE <=1 Sensitive     CIPROFLOXACIN <=0.25 Sensitive     LEVOFLOXACIN <=0.12 Sensitive     ERTAPENEM <=0.5 Sensitive     GENTAMICIN <=1 Sensitive     IMIPENEM <=0.25 Sensitive     NITROFURANTOIN 32 Sensitive     PIP/TAZO <=4 Sensitive     TOBRAMYCIN <=1 Sensitive     TRIMETH/SULFA* <=20 Sensitive      * For uncomplicated UTI caused by E. coli,K. pneumoniae or P. mirabilis: Cefazolin issusceptible if MIC <32 mcg/mL and predictssusceptible to the oral agents cefaclor, cefdinir,cefpodoxime, cefprozil, cefuroxime, cephalexinand loracarbef.Legend:S = Susceptible  I = IntermediateR = Resistant  NS = Not susceptible* = Not tested  NR = Not reported**NN = See antimicrobic comments  Comprehensive metabolic panel     Status: Abnormal   Collection Time: 12/16/17  3:44 PM  Result Value Ref Range   Sodium 139 135 - 145 mEq/L   Potassium 3.7 3.5 - 5.1 mEq/L   Chloride 104 96 - 112 mEq/L   CO2 24 19 - 32 mEq/L   Glucose, Bld 110 (H) 70 - 99 mg/dL   BUN 17 6 - 23 mg/dL   Creatinine, Ser 4.09 0.40 - 1.20 mg/dL   Total Bilirubin 0.3 0.2 - 1.2 mg/dL   Alkaline Phosphatase 83 39 - 117 U/L   AST 16 0 - 37 U/L   ALT 16 0 - 35 U/L   Total Protein 6.8 6.0 - 8.3 g/dL   Albumin 4.1 3.5 - 5.2 g/dL   Calcium 9.3 8.4 - 81.1 mg/dL   GFR 91.47 >82.95 mL/min  CBC with Differential/Platelet     Status: None   Collection Time: 12/16/17  3:44 PM  Result Value Ref Range   WBC 5.1 4.0 - 10.5 K/uL   RBC 4.00 3.87 - 5.11 Mil/uL   Hemoglobin  12.9 12.0 - 15.0 g/dL   HCT 62.1 30.8 - 65.7 %   MCV 93.3 78.0 - 100.0 fl   MCHC 34.6 30.0 - 36.0 g/dL   RDW 84.6 96.2 - 95.2 %   Platelets 157.0 150.0 - 400.0 K/uL   Neutrophils Relative % 64.7 43.0 - 77.0 %   Lymphocytes Relative 24.8 12.0 - 46.0 %   Monocytes Relative 7.3 3.0 - 12.0 %   Eosinophils Relative 1.9 0.0 - 5.0 %   Basophils Relative 1.3 0.0 - 3.0 %   Neutro Abs 3.3 1.4 - 7.7 K/uL   Lymphs Abs 1.3 0.7 - 4.0 K/uL   Monocytes Absolute 0.4 0.1 - 1.0 K/uL   Eosinophils Absolute 0.1 0.0 - 0.7 K/uL   Basophils Absolute 0.1 0.0 - 0.1 K/uL  TSH     Status: Abnormal   Collection Time: 12/16/17  3:44 PM  Result Value Ref Range   TSH 0.21 (L) 0.35 - 4.50 uIU/mL  Vitamin D (25 hydroxy)     Status: None   Collection Time: 12/16/17  3:44 PM  Result Value Ref Range   VITD 53.34 30.00 - 100.00 ng/mL  Hepatitis B surface antibody     Status: Abnormal   Collection Time: 12/16/17  3:44 PM  Result Value Ref Range   Hepatitis B-Post <5 (L) > OR = 10  mIU/mL    Comment: . Patient does not have immunity to hepatitis B virus. . For additional information, please refer to http://education.[facility].com/faq/FAQ105 (This link is being provided for informational/ educational purposes only).   Hepatitis B surface antigen     Status: None   Collection Time: 12/16/17  3:44 PM  Result Value Ref Range   Hepatitis B Surface Ag NON-REACTIVE NON-REACTI  Hepatitis C antibody     Status: None   Collection Time: 12/16/17  3:44 PM  Result Value Ref Range   Hepatitis C Ab NON-REACTIVE NON-REACTI   SIGNAL TO CUT-OFF 0.02 <1.00    Comment: . HCV antibody was non-reactive. There is no laboratory  evidence of HCV infection. . In most cases, no further action is required. However, if recent HCV exposure is suspected, a test for HCV RNA (test code 16109) is suggested. . For additional information please refer to http://education.[facility].com/faq/FAQ22v1 (This link is being provided  for informational/ educational purposes only.) .   Metanephrines, plasma     Status: None   Collection Time: 12/16/17  3:44 PM  Result Value Ref Range   Metanephrine, Free 52 <=57 pg/mL    Comment: . This test was developed and its analytical performance characteristics have been determined by [facility] [place], [place]. It has not been cleared or approved by the U.S. Food and Drug Administration. This assay has been validated pursuant to the CLIA regulations and is used for clinical purposes. .    Normetanephrine, Free 140 <=148 pg/mL    Comment: . This test was developed and its analytical performance characteristics have been determined by [facility] [place], [place]. It has not been cleared or approved by the U.S. Food and Drug Administration. This assay has been validated pursuant to the CLIA regulations and is used for clinical purposes. .    Total Metanephrines-Plasma 192 <=205 pg/mL    Comment: . For additional information, please refer to http://education.[facility].com/faq/MetFractFree (This link is being provided for informational/educatio informational/educational purposes only.) . Elevations >4-fold upper reference range: strongly suggestive of a pheochromocytoma(1). . Elevations >1- 4-fold upper reference range: significant but not diagnostic, may be due to medications or stress. Suggest running 24 hr urine fractionated metanephrines and/or serum Chromagranin A for confirmation. . Reference: . (1)Algeciras-Schimnich A et al, Plasma Chromogranin A or Urine Fractionated Metanephrines Follow-Up Testing Improves the Diagnostic Accuracy of Plasma Fractionated Metanephrines for Pheochromocytoma. The Journal of Clinical Endocrinology # Metabolism 93(1), 91-95, 2008. . . . This test was developed and its analytical performance characteristics have been determined by [facility]  [place], [place]. It has not been cleared or a pproved by the U.S. Food and Drug Administration. This assay has been validated pursuant to the CLIA regulations and is used for clinical purposes. .   Aldosterone + renin activity w/ ratio     Status: None   Collection Time: 12/16/17  3:44 PM  Result Value Ref Range   Aldosterone 2 ng/dL    Comment: . Unable to flag abnormal result(s), please refer     to reference range(s) below: . Adult Reference Ranges for Aldosterone, LC/MS/MS:     Upright  8:00 - 10:00 am    < or = 28 ng/dL     Upright  6:04 -  5:40 pm    < or = 21 ng/dL     Supine   9:81 - 19:14 am       3 - 16 ng/dL .    Renin  Activity 1.57 0.25 - 5.82 ng/mL/h   ALDO / PRA Ratio 1.3 0.9 - 28.9 Ratio    Comment: . This test was developed and its analytical performance characteristics have been determined by [facility] [place], [place]. It has not been cleared or approved by the U.S. Food and Drug Administration. This assay has been validated pursuant to the CLIA regulations and is used for clinical purposes. .   Catecholamines, Fract, Random Urine     Status: Abnormal   Collection Time: 12/16/17  3:51 PM  Result Value Ref Range   Epinephrine, Rand Ur see note     Comment: Epinephrine concentration was below the sensitivity of the assay 2.0 ug/L. Therefore the result was <1.7 mcg/g creat. This result was calculated by dividing 2.0 mcg/L by the creatinine concentration in g/L. Marland Kitchen Reference Range:  2 - 16  mcg/g cr . This test was developed and its analytical performance characteristics have been determined by [facility] [place], [place]. It has not been cleared or approved by the U.S. Food and Drug Administration. This assay has been validated pursuant to the CLIA regulations and is used for clinical purposes. .    Norepinephrine, Random U 72 (H) 7 - 65 mcg/g cr    Comment: . This test was developed and its analytical  performance characteristics have been determined by [facility] [place], [place]. It has not been cleared or approved by the U.S. Food and Drug Administration. This assay has been validated pursuant to the CLIA regulations and is used for clinical purposes. .    Calc Total (E+NE) Random 72 9 - 74 mcg/g cr    Comment: . This test was developed and its analytical performance characteristics have been determined by [facility] [place], [place]. It has not been cleared or approved by the U.S. Food and Drug Administration. This assay has been validated pursuant to the CLIA regulations and is used for clinical purposes. .    Dopamine, Rand Ur 313 40 - 390 mcg/g cr    Comment: . This test was developed and its analytical performance characteristics have been determined by [facility] [place], [place]. It has not been cleared or approved by the U.S. Food and Drug Administration. This assay has been validated pursuant to the CLIA regulations and is used for clinical purposes. .    Creatinine, Random U 119 20 - 275 mg/dL  Urinalysis, Routine w reflex microscopic     Status: Abnormal   Collection Time: 01/07/18 11:24 AM  Result Value Ref Range   Specific Gravity, UA 1.020 1.005 - 1.030   pH, UA 5.5 5.0 - 7.5   Color, UA Yellow Yellow   Appearance Ur Clear Clear   Leukocytes, UA Trace (A) Negative   Protein, UA Negative Negative/Trace   Glucose, UA Negative Negative   Ketones, UA Negative Negative   RBC, UA Negative Negative   Bilirubin, UA Negative Negative   Urobilinogen, Ur 0.2 0.2 - 1.0 mg/dL   Nitrite, UA Negative Negative   Microscopic Examination See below:     Comment: Microscopic was indicated and was performed.  Urine Culture     Status: None   Collection Time: 01/07/18 11:24 AM  Result Value Ref Range   Urine Culture, Routine Final report    Organism ID, Bacteria Comment     Comment: Mixed urogenital  flora 10,000-25,000 colony forming units per mL   Microscopic Examination     Status: Abnormal  Collection Time: 01/07/18 11:24 AM  Result Value Ref Range   WBC, UA 6-10 (A) 0 - 5 /hpf   RBC, UA 3-10 (A) 0 - 2 /hpf   Epithelial Cells (non renal) 0-10 0 - 10 /hpf   Casts None seen None seen /lpf   Crystals Present (A) N/A   Crystal Type Calcium Oxalate N/A   Mucus, UA Present Not Estab.   Bacteria, UA None seen None seen/Few   Objective  Body mass index is 29.12 kg/m. Wt Readings from Last 3 Encounters:  01/19/18 167 lb (75.8 kg)  01/07/18 168 lb 6.4 oz (76.4 kg)  12/16/17 172 lb 12.8 oz (78.4 kg)   Temp Readings from Last 3 Encounters:  01/19/18 98.5 F (36.9 C) (Oral)  01/07/18 98.7 F (37.1 C) (Oral)  12/16/17 98.2 F (36.8 C) (Oral)   BP Readings from Last 3 Encounters:  01/19/18 124/80  01/07/18 (!) 162/100  12/16/17 (!) 178/90   Pulse Readings from Last 3 Encounters:  01/19/18 81  01/07/18 73  12/16/17 81    Physical Exam  Constitutional: She is oriented to person, place, and time. Vital signs are normal. She appears well-developed and well-nourished. She is cooperative.  HENT:  Head: Normocephalic and atraumatic.  Mouth/Throat: Oropharynx is clear and moist and mucous membranes are normal.  Eyes: Pupils are equal, round, and reactive to light. Conjunctivae are normal.  Cardiovascular: Normal rate.  PACs vs PVCs today   Pulmonary/Chest: Effort normal and breath sounds normal.  Neurological: She is alert and oriented to person, place, and time. Gait normal.  Skin: Skin is warm, dry and intact.  Psychiatric: She has a normal mood and affect. Her speech is normal and behavior is normal. Judgment and thought content normal. Cognition and memory are normal.  Nursing note and vitals reviewed.   Assessment   1. HTN and palpitations ? Etiology, PACs/PVCS today  2. HM Plan   1. Cont Dilt SR 60 mg qam 10 am and add IR 30 mg qhs due to at times having some low  BP readings on 60 SR (12 hr) 1x per day  Pt to call in 2 weeks  Will disc with cardiology to see if pt needs another long term holter monitor  Also disc with cardiology other ideas on controlled BP and HR  2.  Had flu shot 03/14/17 Tdap need to confirm date old pcp records and [facility] consider in future pna 23 had 08/18/10 given today as well  prevnar had 06/16/15  Given Rx shingrix pt has h/o shingles   -colonoscopy had 06/03/11 Dr. Bosie Clos diverticulosis f/u in 10 years -mammo [facility] 04/08/17 neg Out of age window pap last 01/26/10 neg  dexa 04/13/15 osteopenia consider repeat in future  Check hep b/C status neg will rec hep B vaccine in future vit D h/o low vit D vit D nl 12/16/17  Lipid had 08/12/17 reviewed  CT chest due 04/2018  Will need dermatology in future      Provider: Dr. French Ana McLean-Scocuzza-Internal Medicine

## 2018-01-19 NOTE — Progress Notes (Signed)
Pre visit review using our clinic review tool, if applicable. No additional management support is needed unless otherwise documented below in the visit note. 

## 2018-01-20 NOTE — Progress Notes (Signed)
Follow-up Outpatient Visit Date: 01/21/2018  Primary Care Provider: McLean-Scocuzza, Pasty Spillers, MD [address] [place] [address]  Chief Complaint: Follow-up labile blood pressure and palpitations  HPI:  Kimberly Cook is a 72 y.o. year-old female with history of hypertension, hyperlipidemia, "mini strokes," GERD, depression, and anxiety, who presents for follow-up of lightheadedness and labile blood pressure.  I last saw her in March, which time she reported that her blood pressure was still up and down.  Prior work-up for secondary hypertension was negative.  Since her last visit, her PCP is also obtained renal artery Dopplers, which did not show evidence of renal artery stenosis.  Today, Kimberly Cook reports that she is feeling better than at our prior visits.  She feels like having moved to [facility] [place] has greatly improved her stress.  She continues to have some stress, noting that her 66-month-old grandchild is undergoing cardiac surgery today for congenital heart defect.  She has not had any significant palpitations dyspnea, or chest pain.  She reports minimal lightheadedness, though her blood pressure has been labile at times.  Overall, it is better than at previous visits.  She notes that her blood pressure and heart rates have been somewhat labile though overall under better control since being switched to diltiazem 60 mg every morning and 30 mg every afternoon.  She has not had any significant bradycardic episodes for several weeks.  Kimberly Cook intermittently sleeps well at night but typically is well rested when she wakes up in the morning.  She does not nap during the day, concerned that she may have trouble falling asleep if she naps too much.  She has not had any significant orthopnea, PND, or edema.  She notes having undergone a sleep study recently and was told that she does not have sleep  apnea.  --------------------------------------------------------------------------------------------------  Cardiovascular History & Procedures: Cardiovascular Problems:  Orthostatic lightheadedness and labile blood pressure  Dyspnea on exertion  Risk Factors:  Hypertension, hyperlipidemia, family history, sedentary lifestyle, and age greater than 39  Cath/PCI:  None  CV Surgery:  None  EP Procedures and Devices:  14-day event monitor (04/24/17): Sinus rhythm without arrhythmias.  48 hour Holter monitor (03/20/17): Predominantly sinus rhythm with occasional PACs and rare PVCs. Brief episode of blocked PACs versus 2:1 AV block.  Non-Invasive Evaluation(s):  Renal artery Doppler (12/26/17): No evidence of renal artery stenosis.  Incidental note made of 2.1 cm left renal cyst.  Carotid Doppler (09/08/17): Minimal plaquing of both carotid arteries.  No significant stenosis.  Patent vertebral arteries with antegrade flow bilaterally.  Normal hemodynamics in both subclavian arteries.  Coronary CTA (04/30/17):Mild to moderate coronary artery disease proximal LAD and ostium of small D2. CT FFR of distal most LAD is 0.79. Coronary calcium score 257. Small left lower lobe nodule (3 mm).  TTE (03/20/17): Normal LV size with mild focal basal hypertrophy of the septum. LVEF 50-55% with normal wall motion. Grade 1 diastolic dysfunction. Mitral annular calcification present. Normal RV size and function. Normal pulmonary artery pressure.  Pharmacologic MPI (12/14/15): Low risk without ischemia or scar. LVEF 68%.  TTE (12/14/15): Normal LV size and wall thickness. LVEF 55-60%.Normal RV size and function. No significant valvular abnormalities.  Recent CV Pertinent Labs: Lab Results  Component Value Date   CHOL 119 08/22/2017   HDL 57 08/22/2017   LDLCALC 46 08/22/2017   TRIG 81 08/22/2017   CHOLHDL 2.1 08/22/2017   CHOLHDL 2.0 10/08/2016   INR 1.06 04/06/2015   K  3.7 12/16/2017    MG 2.1 01/07/2017   BUN 17 12/16/2017   BUN 17 04/11/2017   CREATININE 0.91 12/16/2017   CREATININE 1.18 (H) 01/07/2017    Past medical and surgical history were reviewed and updated in EPIC.  Current Meds  Medication Sig  . aspirin EC 81 MG tablet Take 1 tablet (81 mg total) by mouth daily.  Marland Kitchen atorvastatin (LIPITOR) 20 MG tablet Take 1 tablet (20 mg total) by mouth daily.  . Biotin 1 MG CAPS Take 1 mg by mouth daily.   . Cholecalciferol (VITAMIN D3) 5000 units TABS Take 5,000 Units by mouth daily.  Marland Kitchen diltiazem (CARDIZEM SR) 60 MG 12 hr capsule Take 1 capsule (60 mg total) by mouth every morning.  . diltiazem (CARDIZEM) 30 MG tablet Take 1 tablet (30 mg total) by mouth at bedtime.  Marland Kitchen escitalopram (LEXAPRO) 5 MG tablet Take 5 mg by mouth daily.  . folic acid (FOLVITE) 400 MCG tablet Take 400 mcg by mouth daily.  . hydrochlorothiazide (MICROZIDE) 12.5 MG capsule Take 1 capsule (12.5 mg total) by mouth daily. In am  . levothyroxine (SYNTHROID, LEVOTHROID) 100 MCG tablet Take 1 tablet (100 mcg total) by mouth daily before breakfast.  . omeprazole (PRILOSEC) 40 MG capsule Take 1 capsule (40 mg total) by mouth daily.  . temazepam (RESTORIL) 30 MG capsule Take 1 capsule (30 mg total) by mouth at bedtime as needed for sleep.  Marland Kitchen tiZANidine (ZANAFLEX) 4 MG tablet Take 1 tablet (4 mg total) by mouth 2 (two) times daily as needed for muscle spasms.    Allergies: Patient has no known allergies.  Social History   Tobacco Use  . Smoking status: Passive Smoke Exposure - Never Smoker  . Smokeless tobacco: Never Used  . Tobacco comment: husbands and children smoked in home.   Substance Use Topics  . Alcohol use: No  . Drug use: No    Family History  Problem Relation Age of Onset  . Heart disease Mother   . Hypertension Mother   . Diabetes Mother   . Heart attack Mother 41  . Heart disease Father   . Heart attack Father 31  . Breast cancer Maternal Aunt   . Heart attack Brother      Review of Systems: A 12-system review of systems was performed and was negative except as noted in the HPI.  --------------------------------------------------------------------------------------------------  Physical Exam: BP (!) 142/90 (BP Location: Left Arm, Patient Position: Sitting, Cuff Size: Normal)   Pulse 66   Ht 5' 3.5" (1.613 m)   Wt 172 lb (78 kg)   BMI 29.99 kg/m   General: NAD. HEENT: No conjunctival pallor or scleral icterus. Moist mucous membranes.  OP clear. Neck: Supple without lymphadenopathy, thyromegaly, JVD, or HJR. No carotid bruit. Lungs: Normal work of breathing. Clear to auscultation bilaterally without wheezes or crackles. Heart: We will rate and rhythm with occasional extrasystoles.  No murmurs, rubs, or gallops.  Unable to assess PMI due to body habitus. Abd: Bowel sounds present. Soft, NT/ND without hepatosplenomegaly Ext: No lower extremity edema. Radial, PT, and DP pulses are 2+ bilaterally. Skin: Warm and dry without rash.  EKG: Normal sinus rhythm with PACs and PVCs.  Borderline LVH.  Lab Results  Component Value Date   WBC 5.1 12/16/2017   HGB 12.9 12/16/2017   HCT 37.3 12/16/2017   MCV 93.3 12/16/2017   PLT 157.0 12/16/2017    Lab Results  Component Value Date   NA 139 12/16/2017  K 3.7 12/16/2017   CL 104 12/16/2017   CO2 24 12/16/2017   BUN 17 12/16/2017   CREATININE 0.91 12/16/2017   GLUCOSE 110 (H) 12/16/2017   ALT 16 12/16/2017    Lab Results  Component Value Date   CHOL 119 08/22/2017   HDL 57 08/22/2017   LDLCALC 46 08/22/2017   TRIG 81 08/22/2017   CHOLHDL 2.1 08/22/2017    --------------------------------------------------------------------------------------------------  ASSESSMENT AND PLAN: Lightheadedness Much improved since prior visit.  No syncopal episodes.  Blood pressure today is mildly elevated, though we will tolerated degree of permissive hypertension.  Extensive cardiac work-up thus far has been  largely unrevealing.  No further testing is recommended at this time unless symptoms worsen.  Hypertension Blood pressure mildly elevated today.  Home readings have been anywhere from 120-160 systolic.  I will tolerate a degree of permissive hypertension, given history of lightheadedness and syncope in the past.  We will continue her current regimen of diltiazem and HCTZ.  Coronary artery disease with stable angina No episode of significant chest pain since our last visit.  We will plan to continue her current medications including diltiazem, aspirin, and atorvastatin.  PVC's PVC's again noted on EKG today.  Prior monitor did not show any sustained arrhythmias.  Given lack of symptoms, we will defer additional testing at this time.  Continue diltiazem.  Follow-up: Return to clinic in 6 months.  Yvonne Kendall, MD 01/21/2018 1:56 PM

## 2018-01-21 ENCOUNTER — Encounter: Payer: Self-pay | Admitting: Internal Medicine

## 2018-01-21 ENCOUNTER — Ambulatory Visit: Payer: Medicare HMO | Admitting: Internal Medicine

## 2018-01-21 VITALS — BP 142/90 | HR 66 | Ht 63.5 in | Wt 172.0 lb

## 2018-01-21 DIAGNOSIS — I25118 Atherosclerotic heart disease of native coronary artery with other forms of angina pectoris: Secondary | ICD-10-CM

## 2018-01-21 DIAGNOSIS — I493 Ventricular premature depolarization: Secondary | ICD-10-CM

## 2018-01-21 DIAGNOSIS — R42 Dizziness and giddiness: Secondary | ICD-10-CM | POA: Insufficient documentation

## 2018-01-21 DIAGNOSIS — I10 Essential (primary) hypertension: Secondary | ICD-10-CM

## 2018-01-21 NOTE — Patient Instructions (Signed)
Medication Instructions:  Your physician recommends that you continue on your current medications as directed. Please refer to the Current Medication list given to you today.   Labwork: none  Testing/Procedures: none  Follow-Up: Your physician wants you to follow-up in: 6 MONTHS WITH DR END. You will receive a reminder letter in the mail two months in advance. If you don't receive a letter, please call our office to schedule the follow-up appointment.    If you need a refill on your cardiac medications before your next appointment, please call your pharmacy.   

## 2018-01-22 ENCOUNTER — Ambulatory Visit: Payer: Medicare HMO | Admitting: Internal Medicine

## 2018-01-30 ENCOUNTER — Telehealth: Payer: Self-pay

## 2018-01-30 NOTE — Telephone Encounter (Signed)
Copied from CRM [id number]. Topic: Quick Communication - See Telephone Encounter >> Jan 30, 2018  2:11 PM Waymon Amato wrote: Pt called to say that Dr. Shirlee Latch wanted her to call back and give her blood pressure readings she states she gave the worst days 01/22/18 161/102 75 pulse 2pm 165/103 80 8pm 167/108/84 then 01/23/18 159/96 151/100 01/24/18 163/104 151/100 80 01/25/18 145/98 83 then 01/26/18 148/101 01/28/18 900 am 165/108/80 5pm 167/117 79 01/29/18 150/100 83 and 168/105 76 and then today was 148/95 67 pulse   Best number [phone]

## 2018-02-03 ENCOUNTER — Ambulatory Visit: Payer: Self-pay | Admitting: Internal Medicine

## 2018-02-03 ENCOUNTER — Other Ambulatory Visit: Payer: Self-pay | Admitting: Internal Medicine

## 2018-02-03 DIAGNOSIS — R002 Palpitations: Secondary | ICD-10-CM

## 2018-02-03 DIAGNOSIS — I10 Essential (primary) hypertension: Secondary | ICD-10-CM

## 2018-02-03 MED ORDER — DILTIAZEM HCL ER 60 MG PO CP12: 60.0000 mg | ORAL_CAPSULE | Freq: Two times a day (BID) | ORAL | 0 refills | Status: DC

## 2018-02-03 NOTE — Telephone Encounter (Signed)
BP 148/97 at 0800  -  Pt is awake and alert  Patient states her blood pressure has been fluctuating in regards to the time of day, appoint made with Dr Judie Grieve  At 1030 am by Martie Lee  02/12/2018 . Patient advised to keep a record her blood pressures. Call back if any symptoms of severe headache elevated readings or any neurological deficits.Please see telephone encounter 01/30/2018.BP readings at that time were sent in by the patient .  Triage protocall states patient be seen within  2 weeks. Reason for Disposition . [1] Systolic BP  >= 130 OR Diastolic >= 80 AND [2] taking BP medications  Answer Assessment - Initial Assessment Questions 1. BLOOD PRESSURE: "What is the blood pressure?" "Did you take at least two measurements 5 minutes apart?"     150/99  5 mins later 145/94  Pulse 74  2. ONSET: "When did you take your blood pressure?"       1000 am  3. HOW: "How did you obtain the blood pressure?" (e.g., visiting nurse, automatic home BP monitor)      Home bp monitor   4. HISTORY: "Do you have a history of high blood pressure?"      Yes  5. MEDICATIONS: "Are you taking any medications for blood pressure?" "Have you missed any doses recently?"       Yes   No missed doses   6. OTHER SYMPTOMS: "Do you have any symptoms?" (e.g., headache, chest pain, blurred vision, difficulty breathing, weakness)        Headache scale of 4  - ongoing symptoms  Worse over last several days         Has felt tired for last 4 days - not sleeping well at night   7. PREGNANCY: "Is there any chance you are pregnant?" "When was your last menstrual period?"         N/a  Protocols used: HIGH BLOOD PRESSURE-A-AH

## 2018-02-03 NOTE — Telephone Encounter (Signed)
I want pt to stop 30 mg diltiazem dose at night and take 60 2x per day and call back in 2 weeks with report   TMS

## 2018-02-07 ENCOUNTER — Telehealth: Payer: Self-pay | Admitting: Physician Assistant

## 2018-02-07 DIAGNOSIS — F329 Major depressive disorder, single episode, unspecified: Secondary | ICD-10-CM | POA: Diagnosis not present

## 2018-02-07 DIAGNOSIS — Z7982 Long term (current) use of aspirin: Secondary | ICD-10-CM | POA: Diagnosis not present

## 2018-02-07 DIAGNOSIS — I10 Essential (primary) hypertension: Secondary | ICD-10-CM | POA: Diagnosis not present

## 2018-02-07 DIAGNOSIS — Z79899 Other long term (current) drug therapy: Secondary | ICD-10-CM | POA: Diagnosis not present

## 2018-02-07 DIAGNOSIS — I517 Cardiomegaly: Secondary | ICD-10-CM | POA: Diagnosis not present

## 2018-02-07 NOTE — Telephone Encounter (Signed)
Kimberly Cook is a 72 y.o. female with hypertension. Her daughter Westley Chandler) call with her BP running high the last month. Today it was 187/103, HR 79. She complains of being weak, hands trembling. No CP, SOB.  No syncope.  She has a chronic HA.  No TIA/CVA symptoms. PLAN:  1. Take extra Cardizem 60 mg now 2. Take Cardizem 60 mg this evening (instead of 30 mg). 3. Tomorrow take Cardizem 90 mg Twice daily  4. Patient has follow up with PCP 7/11 for BP 5. Go to ED if no better/worse. Tereso Newcomer, PA-C    02/07/2018 11:40 AM

## 2018-02-09 NOTE — Telephone Encounter (Signed)
Thanks for the update.  I agree with your plan.  Yvonne Kendall, MD [facility] Pager: [phone]

## 2018-02-12 ENCOUNTER — Ambulatory Visit: Payer: Medicare HMO | Admitting: Family Medicine

## 2018-02-12 ENCOUNTER — Ambulatory Visit (INDEPENDENT_AMBULATORY_CARE_PROVIDER_SITE_OTHER): Payer: Medicare HMO | Admitting: Internal Medicine

## 2018-02-12 ENCOUNTER — Encounter: Payer: Self-pay | Admitting: Internal Medicine

## 2018-02-12 VITALS — BP 180/108 | HR 77 | Temp 98.9°F | Ht 63.5 in | Wt 169.0 lb

## 2018-02-12 DIAGNOSIS — I10 Essential (primary) hypertension: Secondary | ICD-10-CM

## 2018-02-12 DIAGNOSIS — E039 Hypothyroidism, unspecified: Secondary | ICD-10-CM

## 2018-02-12 DIAGNOSIS — G4700 Insomnia, unspecified: Secondary | ICD-10-CM | POA: Diagnosis not present

## 2018-02-12 DIAGNOSIS — F419 Anxiety disorder, unspecified: Secondary | ICD-10-CM

## 2018-02-12 LAB — TSH: TSH: 0.49 u[IU]/mL (ref 0.35–4.50)

## 2018-02-12 MED ORDER — HYDRALAZINE HCL 10 MG PO TABS: 10.0000 mg | ORAL_TABLET | Freq: Two times a day (BID) | ORAL | 0 refills | Status: DC | PRN

## 2018-02-12 MED ORDER — LORAZEPAM 0.5 MG PO TABS: 0.5000 mg | ORAL_TABLET | Freq: Two times a day (BID) | ORAL | 2 refills | Status: DC | PRN

## 2018-02-12 NOTE — Progress Notes (Signed)
Chief Complaint  Patient presents with  . Follow-up  . Hypertension   F/u with grandson today  1. HTN uncontrolled with spikes taking cardizem 60 12 hr and hctz 12.5 she recently went to [facility] ED 02/07/18 due to elevated BP. BP log readings 120s-160s/80s-110s she does report mild h/a with BP elevation  She does report anxiety and stress about finances but this has been ongoing. BP has been spiking x 1 year and she notices with exertion BP spikes.  2. Hypothyroidism last tsh low reduced dose   Review of Systems  Constitutional: Negative for weight loss.  Respiratory: Negative for shortness of breath.   Cardiovascular: Negative for chest pain.  Neurological: Positive for headaches.  Psychiatric/Behavioral: The patient is nervous/anxious.        +Stress    Past Medical History:  Diagnosis Date  . Allergy   . Arthritis   . Coronary artery disease 04/2017   Mild to moderate CAD in LAD/diagonal by CTA (CT-FFR of apical LAD 0.79).  . Depression   . Diverticulosis   . Essential hypertension    Normal cardiolite 05/2006 EF 71%  . GERD (gastroesophageal reflux disease)   . Headache   . History of shingles   . Hyperlipidemia   . Hypothyroidism   . Mini stroke (HCC) 2011   . Occipital neuralgia   . Prediabetes   . Stroke (HCC)   . Stroke [facility])    MRI 04/2008 + left sup. frontal gyrus possibly puntate infarct   . Urinary tract infection   . Vitamin D deficiency    Past Surgical History:  Procedure Laterality Date  . ABDOMINAL HYSTERECTOMY    . BLADDER SURGERY     2003  . BREAST BIOPSY Right Over 20 years    Benign  . CHOLECYSTECTOMY    . gastroplication     . JOINT REPLACEMENT Left   . KNEE ARTHROSCOPY Left 2011  . TONSILLECTOMY AND ADENOIDECTOMY    . TOTAL KNEE ARTHROPLASTY Left 04/17/2015   Procedure: LEFT TOTAL KNEE ARTHROPLASTY;  Surgeon: Durene Romans, MD;  Location: [facility];  Service: Orthopedics;  Laterality: Left;   Family History  Problem Relation Age of Onset  .  Heart disease Mother   . Hypertension Mother   . Diabetes Mother   . Heart attack Mother 73  . Heart disease Father   . Heart attack Father 60  . Breast cancer Maternal Aunt   . Heart attack Brother    Social History   Socioeconomic History  . Marital status: Widowed    Spouse name: Not on file  . Number of children: Not on file  . Years of education: Not on file  . Highest education level: Not on file  Occupational History  . Not on file  Social Needs  . Financial resource strain: Not on file  . Food insecurity:    Worry: Not on file    Inability: Not on file  . Transportation needs:    Medical: Not on file    Non-medical: Not on file  Tobacco Use  . Smoking status: Passive Smoke Exposure - Never Smoker  . Smokeless tobacco: Never Used  . Tobacco comment: husbands and children smoked in home.   Substance and Sexual Activity  . Alcohol use: No  . Drug use: No  . Sexual activity: Never  Lifestyle  . Physical activity:    Days per week: Not on file    Minutes per session: Not on file  . Stress: Not  on file  Relationships  . Social connections:    Talks on phone: Not on file    Gets together: Not on file    Attends religious service: Not on file    Active member of club or organization: Not on file    Attends meetings of clubs or organizations: Not on file    Relationship status: Not on file  . Intimate partner violence:    Fear of current or ex partner: Not on file    Emotionally abused: Not on file    Physically abused: Not on file    Forced sexual activity: Not on file  Other Topics Concern  . Not on file  Social History Narrative   Widowed    [place]    Has kids and grandson    Current Meds  Medication Sig  . aspirin EC 81 MG tablet Take 1 tablet (81 mg total) by mouth daily.  Marland Kitchen atorvastatin (LIPITOR) 20 MG tablet Take 1 tablet (20 mg total) by mouth daily.  . Biotin 1 MG CAPS Take 1 mg by mouth daily.   . Cholecalciferol (VITAMIN D3) 5000  units TABS Take 5,000 Units by mouth daily.  Marland Kitchen diltiazem (CARDIZEM SR) 60 MG 12 hr capsule Take 1 capsule (60 mg total) by mouth 2 (two) times daily.  Marland Kitchen escitalopram (LEXAPRO) 5 MG tablet Take 5 mg by mouth daily.  . folic acid (FOLVITE) 400 MCG tablet Take 400 mcg by mouth daily.  . hydrochlorothiazide (MICROZIDE) 12.5 MG capsule Take 1 capsule (12.5 mg total) by mouth daily. In am  . levothyroxine (SYNTHROID, LEVOTHROID) 100 MCG tablet Take 1 tablet (100 mcg total) by mouth daily before breakfast.  . LORazepam (ATIVAN PO) Take by mouth.  Marland Kitchen omeprazole (PRILOSEC) 40 MG capsule Take 1 capsule (40 mg total) by mouth daily.  . temazepam (RESTORIL) 30 MG capsule Take 1 capsule (30 mg total) by mouth at bedtime as needed for sleep.  Marland Kitchen tiZANidine (ZANAFLEX) 4 MG tablet Take 1 tablet (4 mg total) by mouth 2 (two) times daily as needed for muscle spasms.   No Known Allergies Recent Results (from the past 2160 hour(s))  Urinalysis, Routine w reflex microscopic     Status: Abnormal   Collection Time: 12/16/17  3:04 PM  Result Value Ref Range   Color, Urine YELLOW Yellow;Lt. Yellow   APPearance Sl Cloudy (A) Clear   Specific Gravity, Urine 1.025 1.000 - 1.030   pH 5.5 5.0 - 8.0   Total Protein, Urine NEGATIVE Negative   Urine Glucose NEGATIVE Negative   Ketones, ur NEGATIVE Negative   Bilirubin Urine NEGATIVE Negative   Hgb urine dipstick TRACE-INTACT (A) Negative   Urobilinogen, UA 0.2 0.0 - 1.0   Leukocytes, UA MODERATE (A) Negative   Nitrite POSITIVE (A) Negative   WBC, UA TNTC(>50/hpf) (A) 0-2/hpf   RBC / HPF 0-2/hpf 0-2/hpf   Squamous Epithelial / LPF Rare(0-4/hpf) Rare(0-4/hpf)   Bacteria, UA Many(>50/hpf) (A) None  Urine Culture     Status: Abnormal   Collection Time: 12/16/17  3:04 PM  Result Value Ref Range   MICRO NUMBER: [id number]    SPECIMEN QUALITY: ADEQUATE    Sample Source URINE    STATUS: FINAL    ISOLATE 1: Klebsiella oxytoca (A)     Comment: Greater than 100,000 CFU/mL  of Klebsiella oxytoca      Susceptibility   Klebsiella oxytoca - URINE CULTURE, REFLEX    AMOX/CLAVULANIC 4 Sensitive     AMPICILLIN >=  32 Resistant     AMPICILLIN/SULBACTAM 8 Sensitive     CEFAZOLIN 32 Resistant      * For uncomplicated UTI caused by E. coli,K. pneumoniae or P. mirabilis: Cefazolin issusceptible if MIC <32 mcg/mL and predictssusceptible to the oral agents cefaclor, cefdinir,cefpodoxime, cefprozil, cefuroxime, cephalexinand loracarbef.    CEFEPIME <=1 Sensitive     CEFTRIAXONE <=1 Sensitive     CIPROFLOXACIN <=0.25 Sensitive     LEVOFLOXACIN <=0.12 Sensitive     ERTAPENEM <=0.5 Sensitive     GENTAMICIN <=1 Sensitive     IMIPENEM <=0.25 Sensitive     NITROFURANTOIN 32 Sensitive     PIP/TAZO <=4 Sensitive     TOBRAMYCIN <=1 Sensitive     TRIMETH/SULFA <=20 Sensitive      For uncomplicated UTI caused by E. coli,K. pneumoniae or P. mirabilis: Cefazolin issusceptible if MIC <32 mcg/mL and predictssusceptible to the oral agents cefaclor, cefdinir,cefpodoxime, cefprozil, cefuroxime, cephalexinand loracarbef.Legend:S = Susceptible  I = IntermediateR = Resistant  NS = Not susceptible= Not tested  NR = Not reportedNN = See antimicrobic comments  Comprehensive metabolic panel     Status: Abnormal   Collection Time: 12/16/17  3:44 PM  Result Value Ref Range   Sodium 139 135 - 145 mEq/L   Potassium 3.7 3.5 - 5.1 mEq/L   Chloride 104 96 - 112 mEq/L   CO2 24 19 - 32 mEq/L   Glucose, Bld 110 (H) 70 - 99 mg/dL   BUN 17 6 - 23 mg/dL   Creatinine, Ser 7.67 0.40 - 1.20 mg/dL   Total Bilirubin 0.3 0.2 - 1.2 mg/dL   Alkaline Phosphatase 83 39 - 117 U/L   AST 16 0 - 37 U/L   ALT 16 0 - 35 U/L   Total Protein 6.8 6.0 - 8.3 g/dL   Albumin 4.1 3.5 - 5.2 g/dL   Calcium 9.3 8.4 - 34.1 mg/dL   GFR 93.79 >02.40 mL/min  CBC with Differential/Platelet     Status: None   Collection Time: 12/16/17  3:44 PM  Result Value Ref Range   WBC 5.1 4.0 - 10.5 K/uL   RBC 4.00 3.87 - 5.11 Mil/uL    Hemoglobin 12.9 12.0 - 15.0 g/dL   HCT 97.3 53.2 - 99.2 %   MCV 93.3 78.0 - 100.0 fl   MCHC 34.6 30.0 - 36.0 g/dL   RDW 42.6 83.4 - 19.6 %   Platelets 157.0 150.0 - 400.0 K/uL   Neutrophils Relative % 64.7 43.0 - 77.0 %   Lymphocytes Relative 24.8 12.0 - 46.0 %   Monocytes Relative 7.3 3.0 - 12.0 %   Eosinophils Relative 1.9 0.0 - 5.0 %   Basophils Relative 1.3 0.0 - 3.0 %   Neutro Abs 3.3 1.4 - 7.7 K/uL   Lymphs Abs 1.3 0.7 - 4.0 K/uL   Monocytes Absolute 0.4 0.1 - 1.0 K/uL   Eosinophils Absolute 0.1 0.0 - 0.7 K/uL   Basophils Absolute 0.1 0.0 - 0.1 K/uL  TSH     Status: Abnormal   Collection Time: 12/16/17  3:44 PM  Result Value Ref Range   TSH 0.21 (L) 0.35 - 4.50 uIU/mL  Vitamin D (25 hydroxy)     Status: None   Collection Time: 12/16/17  3:44 PM  Result Value Ref Range   VITD 53.34 30.00 - 100.00 ng/mL  Hepatitis B surface antibody     Status: Abnormal   Collection Time: 12/16/17  3:44 PM  Result Value Ref Range   Hepatitis  B-Post <5 (L) > OR = 10 mIU/mL    Comment: . Patient does not have immunity to hepatitis B virus. . For additional information, please refer to http://education.[facility].com/faq/FAQ105 (This link is being provided for informational/ educational purposes only).   Hepatitis B surface antigen     Status: None   Collection Time: 12/16/17  3:44 PM  Result Value Ref Range   Hepatitis B Surface Ag NON-REACTIVE NON-REACTI  Hepatitis C antibody     Status: None   Collection Time: 12/16/17  3:44 PM  Result Value Ref Range   Hepatitis C Ab NON-REACTIVE NON-REACTI   SIGNAL TO CUT-OFF 0.02 <1.00    Comment: . HCV antibody was non-reactive. There is no laboratory  evidence of HCV infection. . In most cases, no further action is required. However, if recent HCV exposure is suspected, a test for HCV RNA (test code 16109) is suggested. . For additional information please refer to http://education.[facility].com/faq/FAQ22v1 (This link is  being provided for informational/ educational purposes only.) .   Metanephrines, plasma     Status: None   Collection Time: 12/16/17  3:44 PM  Result Value Ref Range   Metanephrine, Free 52 <=57 pg/mL    Comment: . This test was developed and its analytical performance characteristics have been determined by [facility] [place], [place]. It has not been cleared or approved by the U.S. Food and Drug Administration. This assay has been validated pursuant to the CLIA regulations and is used for clinical purposes. .    Normetanephrine, Free 140 <=148 pg/mL    Comment: . This test was developed and its analytical performance characteristics have been determined by [facility] [place], [place]. It has not been cleared or approved by the U.S. Food and Drug Administration. This assay has been validated pursuant to the CLIA regulations and is used for clinical purposes. .    Total Metanephrines-Plasma 192 <=205 pg/mL    Comment: . For additional information, please refer to http://education.[facility].com/faq/MetFractFree (This link is being provided for informational/educatio informational/educational purposes only.) . Elevations >4-fold upper reference range: strongly suggestive of a pheochromocytoma(1). . Elevations >1- 4-fold upper reference range: significant but not diagnostic, may be due to medications or stress. Suggest running 24 hr urine fractionated metanephrines and/or serum Chromagranin A for confirmation. . Reference: . (1)Algeciras-Schimnich A et al, Plasma Chromogranin A or Urine Fractionated Metanephrines Follow-Up Testing Improves the Diagnostic Accuracy of Plasma Fractionated Metanephrines for Pheochromocytoma. The Journal of Clinical Endocrinology # Metabolism 93(1), 91-95, 2008. . . . This test was developed and its analytical performance characteristics have been determined by [facility] [place], [place]. It has not been cleared or a pproved by the U.S. Food and Drug Administration. This assay has been validated pursuant to the CLIA regulations and is used for clinical purposes. .   Aldosterone + renin activity w/ ratio     Status: None   Collection Time: 12/16/17  3:44 PM  Result Value Ref Range   Aldosterone 2  ng/dL    Comment: . Unable to flag abnormal result(s), please refer     to reference range(s) below: . Adult Reference Ranges for Aldosterone, LC/MS/MS:     Upright  8:00 - 10:00 am    < or = 28 ng/dL     Upright  6:04 -  5:40 pm    < or = 21 ng/dL     Supine   9:81 - 19:14 am       3 -  16 ng/dL .    Renin Activity 1.57 0.25 - 5.82 ng/mL/h   ALDO / PRA Ratio 1.3 0.9 - 28.9 Ratio    Comment: . This test was developed and its analytical performance characteristics have been determined by [facility] [place], [place]. It has not been cleared or approved by the U.S. Food and Drug Administration. This assay has been validated pursuant to the CLIA regulations and is used for clinical purposes. .   Catecholamines, Fract, Random Urine     Status: Abnormal   Collection Time: 12/16/17  3:51 PM  Result Value Ref Range   Epinephrine, Rand Ur see note     Comment: Epinephrine concentration was below the sensitivity of the assay 2.0 ug/L. Therefore the result was <1.7 mcg/g creat. This result was calculated by dividing 2.0 mcg/L by the creatinine concentration in g/L. Marland Kitchen Reference Range:  2 - 16  mcg/g cr . This test was developed and its analytical performance characteristics have been determined by [facility] [place], [place]. It has not been cleared or approved by the U.S. Food and Drug Administration. This assay has been validated pursuant to the CLIA regulations and is used for clinical purposes. .    Norepinephrine, Random U 72 (H) 7 - 65 mcg/g cr    Comment: . This test was developed  and its analytical performance characteristics have been determined by [facility] [place], [place]. It has not been cleared or approved by the U.S. Food and Drug Administration. This assay has been validated pursuant to the CLIA regulations and is used for clinical purposes. .    Calc Total (E+NE) Random 72 9 - 74 mcg/g cr    Comment: . This test was developed and its analytical performance characteristics have been determined by [facility] [place], [place]. It has not been cleared or approved by the U.S. Food and Drug Administration. This assay has been validated pursuant to the CLIA regulations and is used for clinical purposes. .    Dopamine, Rand Ur 313 40 - 390 mcg/g cr    Comment: . This test was developed and its analytical performance characteristics have been determined by [facility] [place], [place]. It has not been cleared or approved by the U.S. Food and Drug Administration. This assay has been validated pursuant to the CLIA regulations and is used for clinical purposes. .    Creatinine, Random U 119 20 - 275 mg/dL  Urinalysis, Routine w reflex microscopic     Status: Abnormal   Collection Time: 01/07/18 11:24 AM  Result Value Ref Range   Specific Gravity, UA 1.020 1.005 - 1.030   pH, UA 5.5 5.0 - 7.5   Color, UA Yellow Yellow   Appearance Ur Clear Clear   Leukocytes, UA Trace (A) Negative   Protein, UA Negative Negative/Trace   Glucose, UA Negative Negative   Ketones, UA Negative Negative   RBC, UA Negative Negative   Bilirubin, UA Negative Negative   Urobilinogen, Ur 0.2 0.2 - 1.0 mg/dL   Nitrite, UA Negative Negative   Microscopic Examination See below:     Comment: Microscopic was indicated and was performed.  Urine Culture     Status: None   Collection Time: 01/07/18 11:24 AM  Result Value Ref Range   Urine Culture, Routine Final report    Organism ID, Bacteria Comment     Comment:  Mixed urogenital flora 10,000-25,000 colony forming units per mL   Microscopic Examination  Status: Abnormal   Collection Time: 01/07/18 11:24 AM  Result Value Ref Range   WBC, UA 6-10 (A) 0 - 5 /hpf   RBC, UA 3-10 (A) 0 - 2 /hpf   Epithelial Cells (non renal) 0-10 0 - 10 /hpf   Casts None seen None seen /lpf   Crystals Present (A) N/A   Crystal Type Calcium Oxalate N/A   Mucus, UA Present Not Estab.   Bacteria, UA None seen None seen/Few   Objective  Body mass index is 29.47 kg/m. Wt Readings from Last 3 Encounters:  02/12/18 169 lb (76.7 kg)  01/21/18 172 lb (78 kg)  01/19/18 167 lb (75.8 kg)   Temp Readings from Last 3 Encounters:  02/12/18 98.9 F (37.2 C) (Oral)  01/19/18 98.5 F (36.9 C) (Oral)  01/07/18 98.7 F (37.1 C) (Oral)   BP Readings from Last 3 Encounters:  02/12/18 (!) 180/108  01/21/18 (!) 142/90  01/19/18 124/80   Pulse Readings from Last 3 Encounters:  02/12/18 77  01/21/18 66  01/19/18 81    Physical Exam  Constitutional: She is oriented to person, place, and time. She appears well-developed and well-nourished. She is cooperative.  HENT:  Head: Normocephalic and atraumatic.  Mouth/Throat: Oropharynx is clear and moist and mucous membranes are normal.  Eyes: Pupils are equal, round, and reactive to light. Conjunctivae are normal.  Cardiovascular: Normal rate, regular rhythm and normal heart sounds.  Pulmonary/Chest: Effort normal and breath sounds normal.  Neurological: She is alert and oriented to person, place, and time. Gait normal.  Skin: Skin is warm, dry and intact.  Psychiatric: She has a normal mood and affect. Her speech is normal and behavior is normal. Judgment and thought content normal. Cognition and memory are normal.  Nursing note and vitals reviewed.   Assessment   1. HTN ? Etiology related to thyroid medication though has been on chronically, ? Related anxiety/stress previous w/u neg RAS, pheo 2. Hypothyroidism 3.  Anxiety/insomnia  Plan   1. Had side effects to lisinopril, losartan, coreg metoprolol in the past  Try cardizem 60 bid, hctz 12.5 mg qd, add hydralazine 10 mg bid prn BP>140/>90  Log BP  2. Check tsh today  Cont 100 dose  3.  Stop restoril  Try l theanine Add ativan 0.5 mg bid prn or 1 pills qhs prn   See HM last visit   Provider: Dr. French Ana McLean-Scocuzza-[facility]

## 2018-02-12 NOTE — Patient Instructions (Addendum)
Hydralazine 10 mg as needed blood pressure >140/>90  Take Diltiazem 60 mg 2x per day  L theanine supplement for stress and sleep  Try Ativan  1 pill 2x per day or 2 pills at night for sleep  Stop Restoril for sleep  Call me back in 2 weeks with readings  F/u in 3-4 weeks     Hypertension Hypertension, commonly called high blood pressure, is when the force of blood pumping through the arteries is too strong. The arteries are the blood vessels that carry blood from the heart throughout the body. Hypertension forces the heart to work harder to pump blood and may cause arteries to become narrow or stiff. Having untreated or uncontrolled hypertension can cause heart attacks, strokes, kidney disease, and other problems. A blood pressure reading consists of a higher number over a lower number. Ideally, your blood pressure should be below 120/80. The first ("top") number is called the systolic pressure. It is a measure of the pressure in your arteries as your heart beats. The second ("bottom") number is called the diastolic pressure. It is a measure of the pressure in your arteries as the heart relaxes. What are the causes? The cause of this condition is not known. What increases the risk? Some risk factors for high blood pressure are under your control. Others are not. Factors you can change  Smoking.  Having type 2 diabetes mellitus, high cholesterol, or both.  Not getting enough exercise or physical activity.  Being overweight.  Having too much fat, sugar, calories, or salt (sodium) in your diet.  Drinking too much alcohol. Factors that are difficult or impossible to change  Having chronic kidney disease.  Having a family history of high blood pressure.  Age. Risk increases with age.  Race. You may be at higher risk if you are African-American.  Gender. Men are at higher risk than women before age 61. After age 39, women are at higher risk than men.  Having obstructive sleep  apnea.  Stress. What are the signs or symptoms? Extremely high blood pressure (hypertensive crisis) may cause:  Headache.  Anxiety.  Shortness of breath.  Nosebleed.  Nausea and vomiting.  Severe chest pain.  Jerky movements you cannot control (seizures).  How is this diagnosed? This condition is diagnosed by measuring your blood pressure while you are seated, with your arm resting on a surface. The cuff of the blood pressure monitor will be placed directly against the skin of your upper arm at the level of your heart. It should be measured at least twice using the same arm. Certain conditions can cause a difference in blood pressure between your right and left arms. Certain factors can cause blood pressure readings to be lower or higher than normal (elevated) for a short period of time:  When your blood pressure is higher when you are in a health care provider's office than when you are at home, this is called white coat hypertension. Most people with this condition do not need medicines.  When your blood pressure is higher at home than when you are in a health care provider's office, this is called masked hypertension. Most people with this condition may need medicines to control blood pressure.  If you have a high blood pressure reading during one visit or you have normal blood pressure with other risk factors:  You may be asked to return on a different day to have your blood pressure checked again.  You may be asked to monitor your  blood pressure at home for 1 week or longer.  If you are diagnosed with hypertension, you may have other blood or imaging tests to help your health care provider understand your overall risk for other conditions. How is this treated? This condition is treated by making healthy lifestyle changes, such as eating healthy foods, exercising more, and reducing your alcohol intake. Your health care provider may prescribe medicine if lifestyle changes are  not enough to get your blood pressure under control, and if:  Your systolic blood pressure is above 130.  Your diastolic blood pressure is above 80.  Your personal target blood pressure may vary depending on your medical conditions, your age, and other factors. Follow these instructions at home: Eating and drinking  Eat a diet that is high in fiber and potassium, and low in sodium, added sugar, and fat. An example eating plan is called the DASH (Dietary Approaches to Stop Hypertension) diet. To eat this way: ? Eat plenty of fresh fruits and vegetables. Try to fill half of your plate at each meal with fruits and vegetables. ? Eat whole grains, such as whole wheat pasta, brown rice, or whole grain bread. Fill about one quarter of your plate with whole grains. ? Eat or drink low-fat dairy products, such as skim milk or low-fat yogurt. ? Avoid fatty cuts of meat, processed or cured meats, and poultry with skin. Fill about one quarter of your plate with lean proteins, such as fish, chicken without skin, beans, eggs, and tofu. ? Avoid premade and processed foods. These tend to be higher in sodium, added sugar, and fat.  Reduce your daily sodium intake. Most people with hypertension should eat less than 1,500 mg of sodium a day.  Limit alcohol intake to no more than 1 drink a day for nonpregnant women and 2 drinks a day for men. One drink equals 12 oz of beer, 5 oz of wine, or 1 oz of hard liquor. Lifestyle  Work with your health care provider to maintain a healthy body weight or to lose weight. Ask what an ideal weight is for you.  Get at least 30 minutes of exercise that causes your heart to beat faster (aerobic exercise) most days of the week. Activities may include walking, swimming, or biking.  Include exercise to strengthen your muscles (resistance exercise), such as pilates or lifting weights, as part of your weekly exercise routine. Try to do these types of exercises for 30 minutes at  least 3 days a week.  Do not use any products that contain nicotine or tobacco, such as cigarettes and e-cigarettes. If you need help quitting, ask your health care provider.  Monitor your blood pressure at home as told by your health care provider.  Keep all follow-up visits as told by your health care provider. This is important. Medicines  Take over-the-counter and prescription medicines only as told by your health care provider. Follow directions carefully. Blood pressure medicines must be taken as prescribed.  Do not skip doses of blood pressure medicine. Doing this puts you at risk for problems and can make the medicine less effective.  Ask your health care provider about side effects or reactions to medicines that you should watch for. Contact a health care provider if:  You think you are having a reaction to a medicine you are taking.  You have headaches that keep coming back (recurring).  You feel dizzy.  You have swelling in your ankles.  You have trouble with your vision. Get  help right away if:  You develop a severe headache or confusion.  You have unusual weakness or numbness.  You feel faint.  You have severe pain in your chest or abdomen.  You vomit repeatedly.  You have trouble breathing. Summary  Hypertension is when the force of blood pumping through your arteries is too strong. If this condition is not controlled, it may put you at risk for serious complications.  Your personal target blood pressure may vary depending on your medical conditions, your age, and other factors. For most people, a normal blood pressure is less than 120/80.  Hypertension is treated with lifestyle changes, medicines, or a combination of both. Lifestyle changes include weight loss, eating a healthy, low-sodium diet, exercising more, and limiting alcohol. This information is not intended to replace advice given to you by your health care provider. Make sure you discuss any  questions you have with your health care provider. Document Released: 07/22/2005 Document Revised: 06/19/2016 Document Reviewed: 06/19/2016 [employer] Interactive Patient Education  [employer].

## 2018-02-12 NOTE — Progress Notes (Signed)
Pre visit review using our clinic review tool, if applicable. No additional management support is needed unless otherwise documented below in the visit note. 

## 2018-03-01 DIAGNOSIS — S99922A Unspecified injury of left foot, initial encounter: Secondary | ICD-10-CM | POA: Diagnosis not present

## 2018-03-01 DIAGNOSIS — S92515A Nondisplaced fracture of proximal phalanx of left lesser toe(s), initial encounter for closed fracture: Secondary | ICD-10-CM | POA: Diagnosis not present

## 2018-03-03 DIAGNOSIS — S92515A Nondisplaced fracture of proximal phalanx of left lesser toe(s), initial encounter for closed fracture: Secondary | ICD-10-CM | POA: Diagnosis not present

## 2018-03-10 ENCOUNTER — Encounter: Payer: Self-pay | Admitting: Internal Medicine

## 2018-03-10 ENCOUNTER — Ambulatory Visit (INDEPENDENT_AMBULATORY_CARE_PROVIDER_SITE_OTHER): Payer: Medicare HMO | Admitting: Internal Medicine

## 2018-03-10 VITALS — BP 142/80 | HR 75 | Temp 98.3°F | Ht 63.5 in | Wt 169.6 lb

## 2018-03-10 DIAGNOSIS — S92912D Unspecified fracture of left toe(s), subsequent encounter for fracture with routine healing: Secondary | ICD-10-CM

## 2018-03-10 DIAGNOSIS — G4700 Insomnia, unspecified: Secondary | ICD-10-CM | POA: Diagnosis not present

## 2018-03-10 DIAGNOSIS — E782 Mixed hyperlipidemia: Secondary | ICD-10-CM | POA: Diagnosis not present

## 2018-03-10 DIAGNOSIS — F329 Major depressive disorder, single episode, unspecified: Secondary | ICD-10-CM

## 2018-03-10 DIAGNOSIS — F32A Depression, unspecified: Secondary | ICD-10-CM

## 2018-03-10 DIAGNOSIS — S92912A Unspecified fracture of left toe(s), initial encounter for closed fracture: Secondary | ICD-10-CM | POA: Insufficient documentation

## 2018-03-10 DIAGNOSIS — I10 Essential (primary) hypertension: Secondary | ICD-10-CM

## 2018-03-10 DIAGNOSIS — F419 Anxiety disorder, unspecified: Secondary | ICD-10-CM | POA: Diagnosis not present

## 2018-03-10 MED ORDER — LORAZEPAM 0.5 MG PO TABS
0.5000 mg | ORAL_TABLET | Freq: Two times a day (BID) | ORAL | 0 refills | Status: DC | PRN
Start: 2018-03-10 — End: 2018-04-20

## 2018-03-10 MED ORDER — LORAZEPAM 0.5 MG PO TABS: 0.5000 mg | ORAL_TABLET | Freq: Two times a day (BID) | ORAL | 1 refills | Status: DC | PRN

## 2018-03-10 MED ORDER — ZOLPIDEM TARTRATE 5 MG PO TABS: 5.0000 mg | ORAL_TABLET | Freq: Every evening | ORAL | 2 refills | Status: DC | PRN

## 2018-03-10 MED ORDER — ATORVASTATIN CALCIUM 20 MG PO TABS: 20.0000 mg | ORAL_TABLET | Freq: Every day | ORAL | 3 refills | Status: DC

## 2018-03-10 NOTE — Progress Notes (Signed)
Chief Complaint  Patient presents with  . Follow-up   F/u  1. HTN improved had to take hydralazine 10 mg 1x per day, taking other HTN meds as ordered  2. C/o reduced sleep slept 1 hr last night trazadone high dose in the past did not help or amitriptyline did not help in the past she thinks she took [place] in the past and help 3. Fractured L 5th toe w/in the last 2 weeks buddy taped and tried foot boot but hurt her back went to walk in clinic    Review of Systems  Constitutional: Negative for weight loss.  Respiratory: Negative for shortness of breath.   Cardiovascular: Negative for chest pain.  Musculoskeletal: Positive for joint pain.  Neurological: Negative for headaches.   Past Medical History:  Diagnosis Date  . Allergy   . Arthritis   . Coronary artery disease 04/2017   Mild to moderate CAD in LAD/diagonal by CTA (CT-FFR of apical LAD 0.79).  . Depression   . Diverticulosis   . Essential hypertension    Normal cardiolite 05/2006 EF 71%  . GERD (gastroesophageal reflux disease)   . Headache   . History of shingles   . Hyperlipidemia   . Hypothyroidism   . Mini stroke (HCC) 2011   . Occipital neuralgia   . Prediabetes   . Stroke (HCC)   . Stroke [facility])    MRI 04/2008 + left sup. frontal gyrus possibly puntate infarct   . Urinary tract infection   . Vitamin D deficiency    Past Surgical History:  Procedure Laterality Date  . ABDOMINAL HYSTERECTOMY    . BLADDER SURGERY     2003  . BREAST BIOPSY Right Over 20 years    Benign  . CHOLECYSTECTOMY    . gastroplication     . JOINT REPLACEMENT Left   . KNEE ARTHROSCOPY Left 2011  . TONSILLECTOMY AND ADENOIDECTOMY    . TOTAL KNEE ARTHROPLASTY Left 04/17/2015   Procedure: LEFT TOTAL KNEE ARTHROPLASTY;  Surgeon: Durene Romans, MD;  Location: [facility];  Service: Orthopedics;  Laterality: Left;   Family History  Problem Relation Age of Onset  . Heart disease Mother   . Hypertension Mother   . Diabetes Mother   . Heart  attack Mother 78  . Heart disease Father   . Heart attack Father 19  . Breast cancer Maternal Aunt   . Heart attack Brother    Social History   Socioeconomic History  . Marital status: Widowed    Spouse name: Not on file  . Number of children: Not on file  . Years of education: Not on file  . Highest education level: Not on file  Occupational History  . Not on file  Social Needs  . Financial resource strain: Not on file  . Food insecurity:    Worry: Not on file    Inability: Not on file  . Transportation needs:    Medical: Not on file    Non-medical: Not on file  Tobacco Use  . Smoking status: Passive Smoke Exposure - Never Smoker  . Smokeless tobacco: Never Used  . Tobacco comment: husbands and children smoked in home.   Substance and Sexual Activity  . Alcohol use: No  . Drug use: No  . Sexual activity: Never  Lifestyle  . Physical activity:    Days per week: Not on file    Minutes per session: Not on file  . Stress: Not on file  Relationships  .  Social connections:    Talks on phone: Not on file    Gets together: Not on file    Attends religious service: Not on file    Active member of club or organization: Not on file    Attends meetings of clubs or organizations: Not on file    Relationship status: Not on file  . Intimate partner violence:    Fear of current or ex partner: Not on file    Emotionally abused: Not on file    Physically abused: Not on file    Forced sexual activity: Not on file  Other Topics Concern  . Not on file  Social History Narrative   Widowed    [place]    Has kids and grandson    Current Meds  Medication Sig  . aspirin EC 81 MG tablet Take 1 tablet (81 mg total) by mouth daily.  Marland Kitchen atorvastatin (LIPITOR) 20 MG tablet Take 1 tablet (20 mg total) by mouth daily.  . Biotin 1 MG CAPS Take 1 mg by mouth daily.   . Cholecalciferol (VITAMIN D3) 5000 units TABS Take 5,000 Units by mouth daily.  Marland Kitchen diltiazem (CARDIZEM SR) 60 MG  12 hr capsule Take 1 capsule (60 mg total) by mouth 2 (two) times daily.  Marland Kitchen escitalopram (LEXAPRO) 5 MG tablet Take 5 mg by mouth daily.  . folic acid (FOLVITE) 400 MCG tablet Take 400 mcg by mouth daily.  . hydrALAZINE (APRESOLINE) 10 MG tablet Take 1 tablet (10 mg total) by mouth 2 (two) times daily as needed. BP>140/>90  . hydrochlorothiazide (MICROZIDE) 12.5 MG capsule Take 1 capsule (12.5 mg total) by mouth daily. In am  . levothyroxine (SYNTHROID, LEVOTHROID) 100 MCG tablet Take 1 tablet (100 mcg total) by mouth daily before breakfast.  . LORazepam (ATIVAN) 0.5 MG tablet Take 1 tablet (0.5 mg total) by mouth 2 (two) times daily as needed for anxiety or sleep. Or 1 mg at night for sleep  . omeprazole (PRILOSEC) 40 MG capsule Take 1 capsule (40 mg total) by mouth daily.  Marland Kitchen tiZANidine (ZANAFLEX) 4 MG tablet Take 1 tablet (4 mg total) by mouth 2 (two) times daily as needed for muscle spasms.  . [DISCONTINUED] atorvastatin (LIPITOR) 20 MG tablet Take 1 tablet (20 mg total) by mouth daily.  . [DISCONTINUED] LORazepam (ATIVAN) 0.5 MG tablet Take 1 tablet (0.5 mg total) by mouth 2 (two) times daily as needed for anxiety or sleep. Or 1 mg at night for sleep   No Known Allergies Recent Results (from the past 2160 hour(s))  Urinalysis, Routine w reflex microscopic     Status: Abnormal   Collection Time: 12/16/17  3:04 PM  Result Value Ref Range   Color, Urine YELLOW Yellow;Lt. Yellow   APPearance Sl Cloudy (A) Clear   Specific Gravity, Urine 1.025 1.000 - 1.030   pH 5.5 5.0 - 8.0   Total Protein, Urine NEGATIVE Negative   Urine Glucose NEGATIVE Negative   Ketones, ur NEGATIVE Negative   Bilirubin Urine NEGATIVE Negative   Hgb urine dipstick TRACE-INTACT (A) Negative   Urobilinogen, UA 0.2 0.0 - 1.0   Leukocytes, UA MODERATE (A) Negative   Nitrite POSITIVE (A) Negative   WBC, UA TNTC(>50/hpf) (A) 0-2/hpf   RBC / HPF 0-2/hpf 0-2/hpf   Squamous Epithelial / LPF Rare(0-4/hpf) Rare(0-4/hpf)    Bacteria, UA Many(>50/hpf) (A) None  Urine Culture     Status: Abnormal   Collection Time: 12/16/17  3:04 PM  Result Value  Ref Range   MICRO NUMBER: [id number]    SPECIMEN QUALITY: ADEQUATE    Sample Source URINE    STATUS: FINAL    ISOLATE 1: Klebsiella oxytoca (A)     Comment: Greater than 100,000 CFU/mL of Klebsiella oxytoca      Susceptibility   Klebsiella oxytoca - URINE CULTURE, REFLEX    AMOX/CLAVULANIC 4 Sensitive     AMPICILLIN >=32 Resistant     AMPICILLIN/SULBACTAM 8 Sensitive     CEFAZOLIN* 32 Resistant      * For uncomplicated UTI caused by E. coli,K. pneumoniae or P. mirabilis: Cefazolin issusceptible if MIC <32 mcg/mL and predictssusceptible to the oral agents cefaclor, cefdinir,cefpodoxime, cefprozil, cefuroxime, cephalexinand loracarbef.    CEFEPIME <=1 Sensitive     CEFTRIAXONE <=1 Sensitive     CIPROFLOXACIN <=0.25 Sensitive     LEVOFLOXACIN <=0.12 Sensitive     ERTAPENEM <=0.5 Sensitive     GENTAMICIN <=1 Sensitive     IMIPENEM <=0.25 Sensitive     NITROFURANTOIN 32 Sensitive     PIP/TAZO <=4 Sensitive     TOBRAMYCIN <=1 Sensitive     TRIMETH/SULFA* <=20 Sensitive      * For uncomplicated UTI caused by E. coli,K. pneumoniae or P. mirabilis: Cefazolin issusceptible if MIC <32 mcg/mL and predictssusceptible to the oral agents cefaclor, cefdinir,cefpodoxime, cefprozil, cefuroxime, cephalexinand loracarbef.Legend:S = Susceptible  I = IntermediateR = Resistant  NS = Not susceptible* = Not tested  NR = Not reported**NN = See antimicrobic comments  Comprehensive metabolic panel     Status: Abnormal   Collection Time: 12/16/17  3:44 PM  Result Value Ref Range   Sodium 139 135 - 145 mEq/L   Potassium 3.7 3.5 - 5.1 mEq/L   Chloride 104 96 - 112 mEq/L   CO2 24 19 - 32 mEq/L   Glucose, Bld 110 (H) 70 - 99 mg/dL   BUN 17 6 - 23 mg/dL   Creatinine, Ser 7.51 0.40 - 1.20 mg/dL   Total Bilirubin 0.3 0.2 - 1.2 mg/dL   Alkaline Phosphatase 83 39 - 117 U/L   AST 16 0 - 37 U/L    ALT 16 0 - 35 U/L   Total Protein 6.8 6.0 - 8.3 g/dL   Albumin 4.1 3.5 - 5.2 g/dL   Calcium 9.3 8.4 - 70.0 mg/dL   GFR 17.49 >44.96 mL/min  CBC with Differential/Platelet     Status: None   Collection Time: 12/16/17  3:44 PM  Result Value Ref Range   WBC 5.1 4.0 - 10.5 K/uL   RBC 4.00 3.87 - 5.11 Mil/uL   Hemoglobin 12.9 12.0 - 15.0 g/dL   HCT 75.9 16.3 - 84.6 %   MCV 93.3 78.0 - 100.0 fl   MCHC 34.6 30.0 - 36.0 g/dL   RDW 65.9 93.5 - 70.1 %   Platelets 157.0 150.0 - 400.0 K/uL   Neutrophils Relative % 64.7 43.0 - 77.0 %   Lymphocytes Relative 24.8 12.0 - 46.0 %   Monocytes Relative 7.3 3.0 - 12.0 %   Eosinophils Relative 1.9 0.0 - 5.0 %   Basophils Relative 1.3 0.0 - 3.0 %   Neutro Abs 3.3 1.4 - 7.7 K/uL   Lymphs Abs 1.3 0.7 - 4.0 K/uL   Monocytes Absolute 0.4 0.1 - 1.0 K/uL   Eosinophils Absolute 0.1 0.0 - 0.7 K/uL   Basophils Absolute 0.1 0.0 - 0.1 K/uL  TSH     Status: Abnormal   Collection Time: 12/16/17  3:44 PM  Result  Value Ref Range   TSH 0.21 (L) 0.35 - 4.50 uIU/mL  Vitamin D (25 hydroxy)     Status: None   Collection Time: 12/16/17  3:44 PM  Result Value Ref Range   VITD 53.34 30.00 - 100.00 ng/mL  Hepatitis B surface antibody     Status: Abnormal   Collection Time: 12/16/17  3:44 PM  Result Value Ref Range   Hepatitis B-Post <5 (L) > OR = 10 mIU/mL    Comment: . Patient does not have immunity to hepatitis B virus. . For additional information, please refer to http://education.[facility].com/faq/FAQ105 (This link is being provided for informational/ educational purposes only).   Hepatitis B surface antigen     Status: None   Collection Time: 12/16/17  3:44 PM  Result Value Ref Range   Hepatitis B Surface Ag NON-REACTIVE NON-REACTI  Hepatitis C antibody     Status: None   Collection Time: 12/16/17  3:44 PM  Result Value Ref Range   Hepatitis C Ab NON-REACTIVE NON-REACTI   SIGNAL TO CUT-OFF 0.02 <1.00    Comment: . HCV antibody was  non-reactive. There is no laboratory  evidence of HCV infection. . In most cases, no further action is required. However, if recent HCV exposure is suspected, a test for HCV RNA (test code 23557) is suggested. . For additional information please refer to http://education.[facility].com/faq/FAQ22v1 (This link is being provided for informational/ educational purposes only.) .   Metanephrines, plasma     Status: None   Collection Time: 12/16/17  3:44 PM  Result Value Ref Range   Metanephrine, Free 52 <=57 pg/mL    Comment: . This test was developed and its analytical performance characteristics have been determined by [facility] [place], [place]. It has not been cleared or approved by the U.S. Food and Drug Administration. This assay has been validated pursuant to the CLIA regulations and is used for clinical purposes. .    Normetanephrine, Free 140 <=148 pg/mL    Comment: . This test was developed and its analytical performance characteristics have been determined by [facility] [place], [place]. It has not been cleared or approved by the U.S. Food and Drug Administration. This assay has been validated pursuant to the CLIA regulations and is used for clinical purposes. .    Total Metanephrines-Plasma 192 <=205 pg/mL    Comment: . For additional information, please refer to http://education.[facility].com/faq/MetFractFree (This link is being provided for informational/educatio informational/educational purposes only.) . Elevations >4-fold upper reference range: strongly suggestive of a pheochromocytoma(1). . Elevations >1- 4-fold upper reference range: significant but not diagnostic, may be due to medications or stress. Suggest running 24 hr urine fractionated metanephrines and/or serum Chromagranin A for confirmation. . Reference: . (1)Algeciras-Schimnich A et al, Plasma Chromogranin A or Urine Fractionated  Metanephrines Follow-Up Testing Improves the Diagnostic Accuracy of Plasma Fractionated Metanephrines for Pheochromocytoma. The Journal of Clinical Endocrinology # Metabolism 93(1), 91-95, 2008. . . . This test was developed and its analytical performance characteristics have been determined by [facility], [place]. It has not been cleared or a pproved by the U.S. Food and Drug Administration. This assay has been validated pursuant to the CLIA regulations and is used for clinical purposes. .   Aldosterone + renin activity w/ ratio     Status: None   Collection Time: 12/16/17  3:44 PM  Result Value Ref Range   Aldosterone 2  ng/dL    Comment: .  Unable to flag abnormal result(s),  please refer     to reference range(s) below: . Adult Reference Ranges for Aldosterone, LC/MS/MS:     Upright  8:00 - 10:00 am    < or = 28 ng/dL     Upright  6:04 -  5:40 pm    < or = 21 ng/dL     Supine   9:81 - 19:14 am       3 - 16 ng/dL .    Renin Activity 1.57 0.25 - 5.82 ng/mL/h   ALDO / PRA Ratio 1.3 0.9 - 28.9 Ratio    Comment: . This test was developed and its analytical performance characteristics have been determined by [facility] [place], [place]. It has not been cleared or approved by the U.S. Food and Drug Administration. This assay has been validated pursuant to the CLIA regulations and is used for clinical purposes. .   Catecholamines, Fract, Random Urine     Status: Abnormal   Collection Time: 12/16/17  3:51 PM  Result Value Ref Range   Epinephrine, Rand Ur see note     Comment: Epinephrine concentration was below the sensitivity of the assay 2.0 ug/L. Therefore the result was <1.7 mcg/g creat. This result was calculated by dividing 2.0 mcg/L by the creatinine concentration in g/L. Marland Kitchen Reference Range:  2 - 16  mcg/g cr . This test was developed and its analytical performance characteristics have been determined by  [facility] [place], [place]. It has not been cleared or approved by the U.S. Food and Drug Administration. This assay has been validated pursuant to the CLIA regulations and is used for clinical purposes. .    Norepinephrine, Random U 72 (H) 7 - 65 mcg/g cr    Comment: . This test was developed and its analytical performance characteristics have been determined by [facility] [place], [place]. It has not been cleared or approved by the U.S. Food and Drug Administration. This assay has been validated pursuant to the CLIA regulations and is used for clinical purposes. .    Calc Total (E+NE) Random 72 9 - 74 mcg/g cr    Comment: . This test was developed and its analytical performance characteristics have been determined by [facility] [place], [place]. It has not been cleared or approved by the U.S. Food and Drug Administration. This assay has been validated pursuant to the CLIA regulations and is used for clinical purposes. .    Dopamine, Rand Ur 313 40 - 390 mcg/g cr    Comment: . This test was developed and its analytical performance characteristics have been determined by [facility] [place], [place]. It has not been cleared or approved by the U.S. Food and Drug Administration. This assay has been validated pursuant to the CLIA regulations and is used for clinical purposes. .    Creatinine, Random U 119 20 - 275 mg/dL  Urinalysis, Routine w reflex microscopic     Status: Abnormal   Collection Time: 01/07/18 11:24 AM  Result Value Ref Range   Specific Gravity, UA 1.020 1.005 - 1.030   pH, UA 5.5 5.0 - 7.5   Color, UA Yellow Yellow   Appearance Ur Clear Clear   Leukocytes, UA Trace (A) Negative   Protein, UA Negative Negative/Trace   Glucose, UA Negative Negative   Ketones, UA Negative Negative   RBC, UA Negative Negative   Bilirubin, UA Negative Negative   Urobilinogen, Ur 0.2  0.2 - 1.0 mg/dL   Nitrite,  UA Negative Negative   Microscopic Examination See below:     Comment: Microscopic was indicated and was performed.  Urine Culture     Status: None   Collection Time: 01/07/18 11:24 AM  Result Value Ref Range   Urine Culture, Routine Final report    Organism ID, Bacteria Comment     Comment: Mixed urogenital flora 10,000-25,000 colony forming units per mL   Microscopic Examination     Status: Abnormal   Collection Time: 01/07/18 11:24 AM  Result Value Ref Range   WBC, UA 6-10 (A) 0 - 5 /hpf   RBC, UA 3-10 (A) 0 - 2 /hpf   Epithelial Cells (non renal) 0-10 0 - 10 /hpf   Casts None seen None seen /lpf   Crystals Present (A) N/A   Crystal Type Calcium Oxalate N/A   Mucus, UA Present Not Estab.   Bacteria, UA None seen None seen/Few  TSH     Status: None   Collection Time: 02/12/18 11:13 AM  Result Value Ref Range   TSH 0.49 0.35 - 4.50 uIU/mL   Objective  Body mass index is 29.57 kg/m. Wt Readings from Last 3 Encounters:  03/10/18 169 lb 9.6 oz (76.9 kg)  02/12/18 169 lb (76.7 kg)  01/21/18 172 lb (78 kg)   Temp Readings from Last 3 Encounters:  03/10/18 98.3 F (36.8 C) (Oral)  02/12/18 98.9 F (37.2 C) (Oral)  01/19/18 98.5 F (36.9 C) (Oral)   BP Readings from Last 3 Encounters:  03/10/18 (!) 142/80  02/12/18 (!) 180/108  01/21/18 (!) 142/90   Pulse Readings from Last 3 Encounters:  03/10/18 75  02/12/18 77  01/21/18 66    Physical Exam  Constitutional: She is oriented to person, place, and time. Vital signs are normal. She appears well-developed and well-nourished. She is cooperative.  HENT:  Head: Normocephalic and atraumatic.  Mouth/Throat: Oropharynx is clear and moist and mucous membranes are normal.  Eyes: Pupils are equal, round, and reactive to light. Conjunctivae are normal.  Cardiovascular: Normal rate, regular rhythm and normal heart sounds.  Pulmonary/Chest: Effort normal and breath sounds normal.  Neurological: She  is alert and oriented to person, place, and time. Gait normal.  Skin: Skin is warm, dry and intact.  Psychiatric: She has a normal mood and affect. Her speech is normal and behavior is normal. Judgment and thought content normal. Cognition and memory are normal.  Nursing note and vitals reviewed.   Assessment   1. HTN improved  2. Insomnia/anixety  3. L 5th toe fracture  4. HM 5. Hypothyroidism  Plan   1. Improved on dil SR 60 bid, prn hydralazine, hct 12.5 2. Trial ambien 5 qd prn, refilled ativan  3. Cont buddy tape  4.  Had flu shot8/10/18 Tdap need to confirm date old pcp records and [facility] consider in future pna 23 had 1/14/12given today as well  prevnar had 06/16/15  Given Rx shingrix pt has h/o shingles   -colonoscopy had 06/03/11 Dr. Bosie Clos diverticulosis f/u in 10 years -mammo [facility] 04/08/17 neg Out of age window pap last 01/26/10 neg  dexa 04/13/15 osteopenia consider repeat in future  Check hep b/C statusneg will rec hep B vaccine in future vit D h/o low vit Dvit D nl 12/16/17 Lipid had 08/12/17 reviewed CT chest due 04/2018  Will need dermatology in future      5. Levo 100 not on sundays    Provider: Dr. French Ana McLean-Scocuzza-Internal Medicine

## 2018-03-10 NOTE — Patient Instructions (Addendum)
F/u 4 months  Toe Fracture A toe fracture is a break in one of the toe bones (phalanges). What are the causes? This condition may be caused by:  Dropping a heavy object on your toe.  Stubbing your toe.  Overusing your toe or doing repetitive exercise.  Twisting or stretching your toe out of place.  What increases the risk? This condition is more likely to develop in people who:  Play contact sports.  Have a bone disease.  Have a low calcium level.  What are the signs or symptoms? The main symptoms of this condition are swelling and pain in the toe. The pain may get worse with standing or walking. Other symptoms include:  Bruising.  Stiffness.  Numbness.  A change in the way the toe looks.  Broken bones that poke through the skin.  Blood beneath the toenail.  How is this diagnosed? This condition is diagnosed with a physical exam. You may also have X-rays. How is this treated? Treatment for this condition depends on the type of fracture and its severity. Treatment may involve:  Taping the broken toe to a toe that is next to it (buddy taping). This is the most common treatment for fractures in which the bone has not moved out of place (nondisplaced fracture).  Wearing a shoe that has a wide, rigid sole to protect the toe and to limit its movement.  Wearing a walking cast.  Having a procedure to move the toe back into place.  Surgery. This may be needed: ? If there are many pieces of broken bone that are out of place (displaced). ? If the toe joint breaks. ? If the bone breaks through the skin.  Physical therapy. This is done to help regain movement and strength in the toe.  You may need follow-up X-rays to make sure that the bone is healing well and staying in position. Follow these instructions at home: If you have a cast:  Do not stick anything inside the cast to scratch your skin. Doing that increases your risk of infection.  Check the skin around the  cast every day. Report any concerns to your health care provider. You may put lotion on dry skin around the edges of the cast. Do not apply lotion to the skin underneath the cast.  Do not put pressure on any part of the cast until it is fully hardened. This may take several hours.  Keep the cast clean and dry. Bathing  Do not take baths, swim, or use a hot tub until your health care provider approves. Ask your health care provider if you can take showers. You may only be allowed to take sponge baths for bathing.  If your health care provider approves bathing and showering, cover the cast or bandage (dressing) with a watertight plastic bag to protect it from water. Do not let the cast or dressing get wet. Managing pain, stiffness, and swelling  If you do not have a cast, apply ice to the injured area, if directed. ? Put ice in a plastic bag. ? Place a towel between your skin and the bag. ? Leave the ice on for 20 minutes, 2-3 times per day.  Move your toes often to avoid stiffness and to lessen swelling.  Raise (elevate) the injured area above the level of your heart while you are sitting or lying down. Driving  Do not drive or operate heavy machinery while taking pain medicine.  Do not drive while wearing a cast on  a foot that you use for driving. Activity  Return to your normal activities as directed by your health care provider. Ask your health care provider what activities are safe for you.  Perform exercises daily as directed by your health care provider or physical therapist. Safety  Do not use the injured limb to support your body weight until your health care provider says that you can. Use crutches or other assistive devices as directed by your health care provider. General instructions  If your toe was treated with buddy taping, follow your health care provider's instructions for changing the gauze and tape. Change it more often: ? The gauze and tape get wet. If this  happens, dry the space between the toes. ? The gauze and tape are too tight and cause your toe to become pale or numb.  Wear a protective shoe as directed by your health care provider. If you were not given a protective shoe, wear sturdy, supportive shoes. Your shoes should not pinch your toes and should not fit tightly against your toes.  Do not use any tobacco products, including cigarettes, chewing tobacco, or e-cigarettes. Tobacco can delay bone healing. If you need help quitting, ask your health care provider.  Take medicines only as directed by your health care provider.  Keep all follow-up visits as directed by your health care provider. This is important. Contact a health care provider if:  You have a fever.  Your pain medicine is not helping.  Your toe is cold.  Your toe is numb.  You still have pain after one week of rest and treatment.  You still have pain after your health care provider has said that you can start walking again.  You have pain, tingling, or numbness in your foot that is not going away. Get help right away if:  You have severe pain.  You have redness or inflammation in your toe that is getting worse.  You have pain or numbness in your toe that is getting worse.  Your toe turns blue. This information is not intended to replace advice given to you by your health care provider. Make sure you discuss any questions you have with your health care provider. Document Released: 07/19/2000 Document Revised: 03/25/2016 Document Reviewed: 05/18/2014 Elsevier Interactive Patient Education  2018 [employer].  Hypertension Hypertension, commonly called high blood pressure, is when the force of blood pumping through the arteries is too strong. The arteries are the blood vessels that carry blood from the heart throughout the body. Hypertension forces the heart to work harder to pump blood and may cause arteries to become narrow or stiff. Having untreated or  uncontrolled hypertension can cause heart attacks, strokes, kidney disease, and other problems. A blood pressure reading consists of a higher number over a lower number. Ideally, your blood pressure should be below 120/80. The first ("top") number is called the systolic pressure. It is a measure of the pressure in your arteries as your heart beats. The second ("bottom") number is called the diastolic pressure. It is a measure of the pressure in your arteries as the heart relaxes. What are the causes? The cause of this condition is not known. What increases the risk? Some risk factors for high blood pressure are under your control. Others are not. Factors you can change  Smoking.  Having type 2 diabetes mellitus, high cholesterol, or both.  Not getting enough exercise or physical activity.  Being overweight.  Having too much fat, sugar, calories, or salt (sodium)  in your diet.  Drinking too much alcohol. Factors that are difficult or impossible to change  Having chronic kidney disease.  Having a family history of high blood pressure.  Age. Risk increases with age.  Race. You may be at higher risk if you are African-American.  Gender. Men are at higher risk than women before age 67. After age 76, women are at higher risk than men.  Having obstructive sleep apnea.  Stress. What are the signs or symptoms? Extremely high blood pressure (hypertensive crisis) may cause:  Headache.  Anxiety.  Shortness of breath.  Nosebleed.  Nausea and vomiting.  Severe chest pain.  Jerky movements you cannot control (seizures).  How is this diagnosed? This condition is diagnosed by measuring your blood pressure while you are seated, with your arm resting on a surface. The cuff of the blood pressure monitor will be placed directly against the skin of your upper arm at the level of your heart. It should be measured at least twice using the same arm. Certain conditions can cause a  difference in blood pressure between your right and left arms. Certain factors can cause blood pressure readings to be lower or higher than normal (elevated) for a short period of time:  When your blood pressure is higher when you are in a health care provider's office than when you are at home, this is called white coat hypertension. Most people with this condition do not need medicines.  When your blood pressure is higher at home than when you are in a health care provider's office, this is called masked hypertension. Most people with this condition may need medicines to control blood pressure.  If you have a high blood pressure reading during one visit or you have normal blood pressure with other risk factors:  You may be asked to return on a different day to have your blood pressure checked again.  You may be asked to monitor your blood pressure at home for 1 week or longer.  If you are diagnosed with hypertension, you may have other blood or imaging tests to help your health care provider understand your overall risk for other conditions. How is this treated? This condition is treated by making healthy lifestyle changes, such as eating healthy foods, exercising more, and reducing your alcohol intake. Your health care provider may prescribe medicine if lifestyle changes are not enough to get your blood pressure under control, and if:  Your systolic blood pressure is above 130.  Your diastolic blood pressure is above 80.  Your personal target blood pressure may vary depending on your medical conditions, your age, and other factors. Follow these instructions at home: Eating and drinking  Eat a diet that is high in fiber and potassium, and low in sodium, added sugar, and fat. An example eating plan is called the DASH (Dietary Approaches to Stop Hypertension) diet. To eat this way: ? Eat plenty of fresh fruits and vegetables. Try to fill half of your plate at each meal with fruits and  vegetables. ? Eat whole grains, such as whole wheat pasta, brown rice, or whole grain bread. Fill about one quarter of your plate with whole grains. ? Eat or drink low-fat dairy products, such as skim milk or low-fat yogurt. ? Avoid fatty cuts of meat, processed or cured meats, and poultry with skin. Fill about one quarter of your plate with lean proteins, such as fish, chicken without skin, beans, eggs, and tofu. ? Avoid premade and processed foods. These  tend to be higher in sodium, added sugar, and fat.  Reduce your daily sodium intake. Most people with hypertension should eat less than 1,500 mg of sodium a day.  Limit alcohol intake to no more than 1 drink a day for nonpregnant women and 2 drinks a day for men. One drink equals 12 oz of beer, 5 oz of wine, or 1 oz of hard liquor. Lifestyle  Work with your health care provider to maintain a healthy body weight or to lose weight. Ask what an ideal weight is for you.  Get at least 30 minutes of exercise that causes your heart to beat faster (aerobic exercise) most days of the week. Activities may include walking, swimming, or biking.  Include exercise to strengthen your muscles (resistance exercise), such as pilates or lifting weights, as part of your weekly exercise routine. Try to do these types of exercises for 30 minutes at least 3 days a week.  Do not use any products that contain nicotine or tobacco, such as cigarettes and e-cigarettes. If you need help quitting, ask your health care provider.  Monitor your blood pressure at home as told by your health care provider.  Keep all follow-up visits as told by your health care provider. This is important. Medicines  Take over-the-counter and prescription medicines only as told by your health care provider. Follow directions carefully. Blood pressure medicines must be taken as prescribed.  Do not skip doses of blood pressure medicine. Doing this puts you at risk for problems and can make  the medicine less effective.  Ask your health care provider about side effects or reactions to medicines that you should watch for. Contact a health care provider if:  You think you are having a reaction to a medicine you are taking.  You have headaches that keep coming back (recurring).  You feel dizzy.  You have swelling in your ankles.  You have trouble with your vision. Get help right away if:  You develop a severe headache or confusion.  You have unusual weakness or numbness.  You feel faint.  You have severe pain in your chest or abdomen.  You vomit repeatedly.  You have trouble breathing. Summary  Hypertension is when the force of blood pumping through your arteries is too strong. If this condition is not controlled, it may put you at risk for serious complications.  Your personal target blood pressure may vary depending on your medical conditions, your age, and other factors. For most people, a normal blood pressure is less than 120/80.  Hypertension is treated with lifestyle changes, medicines, or a combination of both. Lifestyle changes include weight loss, eating a healthy, low-sodium diet, exercising more, and limiting alcohol. This information is not intended to replace advice given to you by your health care provider. Make sure you discuss any questions you have with your health care provider. Document Released: 07/22/2005 Document Revised: 06/19/2016 Document Reviewed: 06/19/2016 Elsevier Interactive Patient Education  [employer].

## 2018-03-10 NOTE — Progress Notes (Signed)
Pre visit review using our clinic review tool, if applicable. No additional management support is needed unless otherwise documented below in the visit note. 

## 2018-04-10 ENCOUNTER — Telehealth: Payer: Self-pay | Admitting: Internal Medicine

## 2018-04-10 NOTE — Telephone Encounter (Signed)
Request for walker and shower chair

## 2018-04-10 NOTE — Telephone Encounter (Signed)
Copied from CRM [id number]. Topic: Quick Communication - See Telephone Encounter >> Apr 10, 2018  3:17 PM Maia Petties wrote: CRM for notification. See Telephone encounter for: 04/10/18. Pt states that she has slipped discs and is unsteady/unstable sometimes with balance. Her children want her to get a walker w/seat and a shower chair. She said her insurance advised with MD orders they would bay 80%. Will Dr. Judie Grieve write order? Can this be mailed to pt home address? Please advise.  Address verified: [address]  [place] [address]

## 2018-04-13 NOTE — Telephone Encounter (Signed)
Patient has been made aware of rx placed up front for pick up.

## 2018-04-13 NOTE — Telephone Encounter (Signed)
Rx ready for pick up in your box  TMS

## 2018-04-15 NOTE — Telephone Encounter (Signed)
FYI

## 2018-04-15 NOTE — Telephone Encounter (Signed)
Pt has authorized her grandson to come pick up - [employer]

## 2018-04-19 ENCOUNTER — Other Ambulatory Visit: Payer: Self-pay

## 2018-04-19 ENCOUNTER — Encounter: Payer: Self-pay | Admitting: Emergency Medicine

## 2018-04-19 ENCOUNTER — Emergency Department
Admission: EM | Admit: 2018-04-19 | Discharge: 2018-04-19 | Disposition: A | Discharge status: Home / Self Care | Payer: Medicare HMO | Attending: Emergency Medicine | Admitting: Emergency Medicine

## 2018-04-19 DIAGNOSIS — E039 Hypothyroidism, unspecified: Secondary | ICD-10-CM | POA: Diagnosis not present

## 2018-04-19 DIAGNOSIS — Z7722 Contact with and (suspected) exposure to environmental tobacco smoke (acute) (chronic): Secondary | ICD-10-CM | POA: Insufficient documentation

## 2018-04-19 DIAGNOSIS — I2510 Atherosclerotic heart disease of native coronary artery without angina pectoris: Secondary | ICD-10-CM | POA: Diagnosis not present

## 2018-04-19 DIAGNOSIS — Z79899 Other long term (current) drug therapy: Secondary | ICD-10-CM | POA: Insufficient documentation

## 2018-04-19 DIAGNOSIS — N390 Urinary tract infection, site not specified: Secondary | ICD-10-CM

## 2018-04-19 DIAGNOSIS — R319 Hematuria, unspecified: Secondary | ICD-10-CM | POA: Diagnosis not present

## 2018-04-19 DIAGNOSIS — I129 Hypertensive chronic kidney disease with stage 1 through stage 4 chronic kidney disease, or unspecified chronic kidney disease: Secondary | ICD-10-CM | POA: Diagnosis not present

## 2018-04-19 DIAGNOSIS — N183 Chronic kidney disease, stage 3 (moderate): Secondary | ICD-10-CM | POA: Insufficient documentation

## 2018-04-19 DIAGNOSIS — Z96652 Presence of left artificial knee joint: Secondary | ICD-10-CM | POA: Diagnosis not present

## 2018-04-19 DIAGNOSIS — R300 Dysuria: Secondary | ICD-10-CM | POA: Diagnosis present

## 2018-04-19 LAB — CBC
HCT: 39.6 % (ref 35.0–47.0)
Hemoglobin: 13.9 g/dL (ref 12.0–16.0)
MCH: 31.4 pg (ref 26.0–34.0)
MCHC: 35.1 g/dL (ref 32.0–36.0)
MCV: 89.5 fL (ref 80.0–100.0)
Platelets: 170 10*3/uL (ref 150–440)
RBC: 4.43 MIL/uL (ref 3.80–5.20)
RDW: 12.9 % (ref 11.5–14.5)
WBC: 5.3 10*3/uL (ref 3.6–11.0)

## 2018-04-19 LAB — URINALYSIS, COMPLETE (UACMP) WITH MICROSCOPIC
Bilirubin Urine: NEGATIVE
Glucose, UA: NEGATIVE mg/dL
Hgb urine dipstick: NEGATIVE
Ketones, ur: 5 mg/dL — AB
Nitrite: POSITIVE — AB
Protein, ur: 30 mg/dL — AB
Specific Gravity, Urine: 1.027 (ref 1.005–1.030)
WBC, UA: >50 WBC/hpf — ABNORMAL HIGH (ref 0–5)
pH: 5 (ref 5.0–8.0)

## 2018-04-19 LAB — COMPREHENSIVE METABOLIC PANEL
ALT: 19 U/L (ref 0–44)
AST: 24 U/L (ref 15–41)
Albumin: 4.2 g/dL (ref 3.5–5.0)
Alkaline Phosphatase: 86 U/L (ref 38–126)
Anion gap: 12 (ref 5–15)
BUN: 20 mg/dL (ref 8–23)
CO2: 22 mmol/L (ref 22–32)
Calcium: 9.7 mg/dL (ref 8.9–10.3)
Chloride: 104 mmol/L (ref 98–111)
Creatinine, Ser: 0.89 mg/dL (ref 0.44–1.00)
GFR calc Af Amer: >60 mL/min (ref >60)
GFR calc non Af Amer: >60 mL/min (ref >60)
Glucose, Bld: 125 mg/dL — ABNORMAL HIGH (ref 70–99)
Potassium: 3.6 mmol/L (ref 3.5–5.1)
Sodium: 138 mmol/L (ref 135–145)
Total Bilirubin: 1 mg/dL (ref 0.3–1.2)
Total Protein: 7.7 g/dL (ref 6.5–8.1)

## 2018-04-19 LAB — LIPASE, BLOOD: Lipase: 36 U/L (ref 11–51)

## 2018-04-19 MED ORDER — CEFDINIR 300 MG PO CAPS
300.0000 mg | ORAL_CAPSULE | Freq: Two times a day (BID) | ORAL | Status: DC
  Administered 2018-04-19: 300 mg via ORAL
  Filled 2018-04-19 (×2): qty 1

## 2018-04-19 MED ORDER — CEFDINIR 300 MG PO CAPS: 300.0000 mg | ORAL_CAPSULE | Freq: Two times a day (BID) | ORAL | 0 refills | Status: DC

## 2018-04-19 NOTE — ED Triage Notes (Signed)
First Nurse Note:  C/O low back pain, lower abdominal pain, decreased appetite and general sickness x 2-3 days.

## 2018-04-19 NOTE — ED Provider Notes (Signed)
[facility] Emergency Department Provider Note ____________________________________________   First MD Initiated Contact with Patient 04/19/18 1119     (approximate)  I have reviewed the triage vital signs and the nursing notes.   HISTORY  Chief Complaint Abdominal Pain and Back Pain   HPI NEYA CREEGAN is a 72 y.o. female with a history of scoliosis as well as chronic back pain was presented to the emergency department several weeks of urinary urgency as well as burning with urination.  She also says that when she tries to use the bathroom takes a very long time to pee.  She is denying any back pain at this time.  Says that she had a fever at home this morning but did not take any Tylenol and ibuprofen and is afebrile here.  Says that she is also developed a mild to moderate generalized headache with the symptoms as well.  Past Medical History:  Diagnosis Date  . Allergy   . Arthritis   . Coronary artery disease 04/2017   Mild to moderate CAD in LAD/diagonal by CTA (CT-FFR of apical LAD 0.79).  . Depression   . Diverticulosis   . Essential hypertension    Normal cardiolite 05/2006 EF 71%  . GERD (gastroesophageal reflux disease)   . Headache   . History of shingles   . Hyperlipidemia   . Hypothyroidism   . Mini stroke (HCC) 2011   . Occipital neuralgia   . Prediabetes   . Stroke (HCC)   . Stroke [facility])    MRI 04/2008 + left sup. frontal gyrus possibly puntate infarct   . Urinary tract infection   . Vitamin D deficiency     Patient Active Problem List   Diagnosis Date Noted  . Toe fracture, left 03/10/2018  . Lightheadedness 01/21/2018  . PAC (premature atrial contraction) 01/19/2018  . PVC's (premature ventricular contractions) 01/19/2018  . Hernia, paraesophageal 12/16/2017  . Kidney cysts 12/16/2017  . CKD (chronic kidney disease) stage 3, GFR 30-59 ml/min (HCC) 12/16/2017  . Carotid artery stenosis 12/16/2017  . Chronic pain  12/16/2017  . Osteopenia 12/16/2017  . DDD (degenerative disc disease), cervical 12/16/2017  . Labile hypertension 09/05/2017  . Anxiety and depression 08/27/2017  . Insomnia 08/27/2017  . Syncope 08/27/2017  . Bilateral occipital neuralgia 08/27/2017  . Lung nodule 06/30/2017  . Coronary artery disease of native artery of native heart with stable angina pectoris (HCC) 06/03/2017  . Shortness of breath 04/11/2017  . Labile blood pressure 03/08/2017  . Syncope, near 03/08/2017  . Palpitations 03/08/2017  . Orthostatic lightheadedness 03/08/2017  . Essential hypertension   . S/P TKR (total knee replacement) 04/17/2015  . Morbid obesity (HCC) 09/21/2014  . Medication management 09/21/2014  . Hypothyroidism 02/17/2014  . Mixed hyperlipidemia   . GERD (gastroesophageal reflux disease)   . Vitamin D deficiency   . Depression     Past Surgical History:  Procedure Laterality Date  . ABDOMINAL HYSTERECTOMY    . BLADDER SURGERY     2003  . BREAST BIOPSY Right Over 20 years    Benign  . CHOLECYSTECTOMY    . gastroplication     . JOINT REPLACEMENT Left   . KNEE ARTHROSCOPY Left 2011  . TONSILLECTOMY AND ADENOIDECTOMY    . TOTAL KNEE ARTHROPLASTY Left 04/17/2015   Procedure: LEFT TOTAL KNEE ARTHROPLASTY;  Surgeon: Durene Romans, MD;  Location: [facility] ORS;  Service: Orthopedics;  Laterality: Left;    Prior to Admission medications  Medication Sig Start Date End Date Taking? Authorizing Provider  aspirin EC 81 MG tablet Take 1 tablet (81 mg total) by mouth daily. 10/20/17   End, Cristal Deer, MD  atorvastatin (LIPITOR) 20 MG tablet Take 1 tablet (20 mg total) by mouth daily. 03/10/18   McLean-Scocuzza, Pasty Spillers, MD  Biotin 1 MG CAPS Take 1 mg by mouth daily.     [provider]  Cholecalciferol (VITAMIN D3) 5000 units TABS Take 5,000 Units by mouth daily.    [provider]  diltiazem (CARDIZEM SR) 60 MG 12 hr capsule Take 1 capsule (60 mg total) by mouth 2 (two) times daily.  02/03/18   McLean-Scocuzza, Pasty Spillers, MD  escitalopram (LEXAPRO) 5 MG tablet Take 5 mg by mouth daily.    [provider]  folic acid (FOLVITE) 400 MCG tablet Take 400 mcg by mouth daily.    [provider]  hydrALAZINE (APRESOLINE) 10 MG tablet Take 1 tablet (10 mg total) by mouth 2 (two) times daily as needed. BP>140/>90 02/12/18   McLean-Scocuzza, Pasty Spillers, MD  hydrochlorothiazide (MICROZIDE) 12.5 MG capsule Take 1 capsule (12.5 mg total) by mouth daily. In am 01/07/18   McLean-Scocuzza, Pasty Spillers, MD  levothyroxine (SYNTHROID, LEVOTHROID) 100 MCG tablet Take 1 tablet (100 mcg total) by mouth daily before breakfast. 12/24/17   McLean-Scocuzza, Pasty Spillers, MD  LORazepam (ATIVAN) 0.5 MG tablet Take 1 tablet (0.5 mg total) by mouth 2 (two) times daily as needed for anxiety or sleep. Or 1 mg at night for sleep 03/10/18   McLean-Scocuzza, Pasty Spillers, MD  LORazepam (ATIVAN) 0.5 MG tablet Take 1 tablet (0.5 mg total) by mouth 2 (two) times daily as needed for anxiety. Or 2 pills at night as needed 03/10/18   McLean-Scocuzza, Pasty Spillers, MD  omeprazole (PRILOSEC) 40 MG capsule Take 1 capsule (40 mg total) by mouth daily. 12/16/16   Lucky Cowboy, MD  tiZANidine (ZANAFLEX) 4 MG tablet Take 1 tablet (4 mg total) by mouth 2 (two) times daily as needed for muscle spasms. 12/16/17   McLean-Scocuzza, Pasty Spillers, MD  zolpidem (AMBIEN) 5 MG tablet Take 1 tablet (5 mg total) by mouth at bedtime as needed for sleep. 03/10/18   McLean-Scocuzza, Pasty Spillers, MD    Allergies Patient has no known allergies.  Family History  Problem Relation Age of Onset  . Heart disease Mother   . Hypertension Mother   . Diabetes Mother   . Heart attack Mother 61  . Heart disease Father   . Heart attack Father 13  . Breast cancer Maternal Aunt   . Heart attack Brother     Social History Social History   Tobacco Use  . Smoking status: Passive Smoke Exposure - Never Smoker  . Smokeless tobacco: Never Used  . Tobacco comment:  husbands and children smoked in home.   Substance Use Topics  . Alcohol use: No  . Drug use: No    Review of Systems  Constitutional: No fever/chills Eyes: No visual changes. ENT: No sore throat. Cardiovascular: Denies chest pain. Respiratory: Denies shortness of breath. Gastrointestinal:No nausea, no vomiting.  No diarrhea.  No constipation. Genitourinary: As above Musculoskeletal: As above Skin: Negative for rash. Neurological: Negative for focal weakness or numbness.   ____________________________________________   PHYSICAL EXAM:  VITAL SIGNS: ED Triage Vitals  Enc Vitals Group     BP 04/19/18 1050 137/88     Pulse Rate 04/19/18 1050 85     Resp 04/19/18 1050 18  Temp 04/19/18 1050 97.9 F (36.6 C)     Temp Source 04/19/18 1050 Oral     SpO2 04/19/18 1050 97 %     Weight 04/19/18 1052 152 lb (68.9 kg)     Height 04/19/18 1052 5\' 3"  (1.6 m)     Head Circumference --      Peak Flow --      Pain Score 04/19/18 1049 6     Pain Loc --      Pain Edu? --      Excl. in GC? --     Constitutional: Alert and oriented. Well appearing and in no acute distress. Eyes: Conjunctivae are normal.  Head: Atraumatic. Nose: No congestion/rhinnorhea. Mouth/Throat: Mucous membranes are moist.  Neck: No stridor.   Cardiovascular: Normal rate, regular rhythm. Grossly normal heart sounds.  Respiratory: Normal respiratory effort.  No retractions. Lungs CTAB. Gastrointestinal: Soft and nontender. No distention. No CVA tenderness. Musculoskeletal: No lower extremity tenderness nor edema.  No joint effusions. Neurologic:  Normal speech and language. No gross focal neurologic deficits are appreciated. Skin:  Skin is warm, dry and intact. No rash noted. Psychiatric: Mood and affect are normal. Speech and behavior are normal.  ____________________________________________   LABS (all labs ordered are listed, but only abnormal results are displayed)  Labs Reviewed  COMPREHENSIVE  METABOLIC PANEL - Abnormal; Notable for the following components:      Result Value   Glucose, Bld 125 (*)    All other components within normal limits  URINALYSIS, COMPLETE (UACMP) WITH MICROSCOPIC - Abnormal; Notable for the following components:   Color, Urine AMBER (*)    APPearance CLOUDY (*)    Ketones, ur 5 (*)    Protein, ur 30 (*)    Nitrite POSITIVE (*)    Leukocytes, UA MODERATE (*)    WBC, UA >50 (*)    Bacteria, UA MANY (*)    All other components within normal limits  LIPASE, BLOOD  CBC   ____________________________________________  EKG   ____________________________________________  RADIOLOGY   ____________________________________________   PROCEDURES  Procedure(s) performed:   Procedures  Critical Care performed:   ____________________________________________   INITIAL IMPRESSION / ASSESSMENT AND PLAN / ED COURSE  Pertinent labs & imaging results that were available during my care of the patient were reviewed by me and considered in my medical decision making (see chart for details).  Differential diagnosis includes, but is not limited to, ovarian cyst, ovarian torsion, acute appendicitis, diverticulitis, urinary tract infection/pyelonephritis, endometriosis, bowel obstruction, colitis, renal colic, gastroenteritis, hernia, fibroids, endometriosis, pregnancy related pain including ectopic pregnancy, etc. As part of my medical decision making, I reviewed the following data within the electronic MEDICAL RECORD NUMBER Notes from prior ED visits  ----------------------------------------- 1:45 PM on 04/19/2018 -----------------------------------------  Patient with urine which is positive for UTI.  Positive for nitrite, leukocytes, WBC as well as BBC clumps.  Patient with multiple positive urine cultures in the past with E. coli growth.  Several were resistant to first generation cephalosporins.  Because the constellation of symptoms of the patient feeling  weak as well as having intermittent back pain I will put her on Ceftin ear.  She is understanding of the diagnosis as well as treatment plan and willing to comply.  Will be discharged home at this time.  She had a question about a kidney stone.  However, patient is resting comfortably.  Symptoms much more consistent with UTI because of the urgency as well as a positive urinalysis.  Furthermore, there is negative hemoglobin in the urine.  Only several red blood cells which is likely more an indication of cystitis and inflammation along the urinary tract and blood from a stone.  ____________________________________________   FINAL CLINICAL IMPRESSION(S) / ED DIAGNOSES  UTI.  NEW MEDICATIONS STARTED DURING THIS VISIT:  New Prescriptions   No medications on file     Note:  This document was prepared using Dragon voice recognition software and may include unintentional dictation errors.     Myrna Blazer, MD 04/19/18 1346

## 2018-04-20 ENCOUNTER — Ambulatory Visit (INDEPENDENT_AMBULATORY_CARE_PROVIDER_SITE_OTHER): Payer: Medicare HMO

## 2018-04-20 ENCOUNTER — Encounter: Payer: Self-pay | Admitting: Internal Medicine

## 2018-04-20 ENCOUNTER — Other Ambulatory Visit: Payer: Self-pay | Admitting: Pulmonary Disease

## 2018-04-20 ENCOUNTER — Ambulatory Visit (INDEPENDENT_AMBULATORY_CARE_PROVIDER_SITE_OTHER): Payer: Medicare HMO | Admitting: Internal Medicine

## 2018-04-20 VITALS — BP 146/96 | HR 82 | Temp 98.7°F | Ht 63.0 in | Wt 163.6 lb

## 2018-04-20 DIAGNOSIS — M545 Low back pain: Secondary | ICD-10-CM

## 2018-04-20 DIAGNOSIS — E538 Deficiency of other specified B group vitamins: Secondary | ICD-10-CM

## 2018-04-20 DIAGNOSIS — S8992XA Unspecified injury of left lower leg, initial encounter: Secondary | ICD-10-CM | POA: Diagnosis not present

## 2018-04-20 DIAGNOSIS — R638 Other symptoms and signs concerning food and fluid intake: Secondary | ICD-10-CM

## 2018-04-20 DIAGNOSIS — N3000 Acute cystitis without hematuria: Secondary | ICD-10-CM

## 2018-04-20 DIAGNOSIS — R197 Diarrhea, unspecified: Secondary | ICD-10-CM

## 2018-04-20 DIAGNOSIS — R112 Nausea with vomiting, unspecified: Secondary | ICD-10-CM

## 2018-04-20 DIAGNOSIS — M25562 Pain in left knee: Secondary | ICD-10-CM

## 2018-04-20 DIAGNOSIS — G4700 Insomnia, unspecified: Secondary | ICD-10-CM | POA: Diagnosis not present

## 2018-04-20 DIAGNOSIS — E559 Vitamin D deficiency, unspecified: Secondary | ICD-10-CM | POA: Diagnosis not present

## 2018-04-20 DIAGNOSIS — R739 Hyperglycemia, unspecified: Secondary | ICD-10-CM | POA: Diagnosis not present

## 2018-04-20 DIAGNOSIS — E039 Hypothyroidism, unspecified: Secondary | ICD-10-CM

## 2018-04-20 DIAGNOSIS — G8929 Other chronic pain: Secondary | ICD-10-CM

## 2018-04-20 DIAGNOSIS — R29898 Other symptoms and signs involving the musculoskeletal system: Secondary | ICD-10-CM | POA: Diagnosis not present

## 2018-04-20 DIAGNOSIS — R918 Other nonspecific abnormal finding of lung field: Secondary | ICD-10-CM

## 2018-04-20 DIAGNOSIS — I10 Essential (primary) hypertension: Secondary | ICD-10-CM

## 2018-04-20 MED ORDER — ZOLPIDEM TARTRATE ER 6.25 MG PO TBCR: 6.2500 mg | EXTENDED_RELEASE_TABLET | Freq: Every evening | ORAL | 2 refills | Status: DC | PRN

## 2018-04-20 MED ORDER — METHYLPREDNISOLONE ACETATE 40 MG/ML IJ SUSP
40.0000 mg | Freq: Once | INTRAMUSCULAR | Status: AC
  Administered 2018-04-20: 40 mg via INTRAMUSCULAR

## 2018-04-20 NOTE — Patient Instructions (Addendum)
Tylenol 650 mg up to 4 in 1 day for pain for back   Results for Kimberly Cook, Kimberly Cook (MRN [id number]) as of 04/20/2018 11:18  Ref. Range 04/19/2018 10:56  Sodium Latest Ref Range: 135 - 145 mmol/L 138  Potassium Latest Ref Range: 3.5 - 5.1 mmol/L 3.6  Chloride Latest Ref Range: 98 - 111 mmol/L 104  CO2 Latest Ref Range: 22 - 32 mmol/L 22  Glucose Latest Ref Range: 70 - 99 mg/dL 403 (H)  BUN Latest Ref Range: 8 - 23 mg/dL 20  Creatinine Latest Ref Range: 0.44 - 1.00 mg/dL 4.74  Calcium Latest Ref Range: 8.9 - 10.3 mg/dL 9.7  Anion gap Latest Ref Range: 5 - 15  12  Alkaline Phosphatase Latest Ref Range: 38 - 126 U/L 86  Albumin Latest Ref Range: 3.5 - 5.0 g/dL 4.2  Lipase Latest Ref Range: 11 - 51 U/L 36  AST Latest Ref Range: 15 - 41 U/L 24  ALT Latest Ref Range: 0 - 44 U/L 19  Total Protein Latest Ref Range: 6.5 - 8.1 g/dL 7.7  Total Bilirubin Latest Ref Range: 0.3 - 1.2 mg/dL 1.0  GFR, Est Non African American Latest Ref Range: >60 mL/min >60  GFR, Est African American Latest Ref Range: >60 mL/min >60    Results for Kimberly Cook, Kimberly Cook (MRN [id number]) as of 04/20/2018 11:18  Ref. Range 04/19/2018 10:56  WBC Latest Ref Range: 3.6 - 11.0 K/uL 5.3  RBC Latest Ref Range: 3.80 - 5.20 MIL/uL 4.43  Hemoglobin Latest Ref Range: 12.0 - 16.0 g/dL 64.3  HCT Latest Ref Range: 35.0 - 47.0 % 39.6  MCV Latest Ref Range: 80.0 - 100.0 fL 89.5  MCH Latest Ref Range: 26.0 - 34.0 pg 31.4  MCHC Latest Ref Range: 32.0 - 36.0 g/dL 32.9  RDW Latest Ref Range: 11.5 - 14.5 % 12.9  Platelets Latest Ref Range: 150 - 440 K/uL 170   Order MRI low back  Ambien change to extended release 6.25 mg at night    Back Pain, Adult Many adults have back pain from time to time. Common causes of back pain include:  A strained muscle or ligament.  Wear and tear (degeneration) of the spinal disks.  Arthritis.  A hit to the back.  Back pain can be short-lived (acute) or last a long time (chronic). A physical exam,  lab tests, and imaging studies may be done to find the cause of your pain. Follow these instructions at home: Managing pain and stiffness  Take over-the-counter and prescription medicines only as told by your health care provider.  If directed, apply heat to the affected area as often as told by your health care provider. Use the heat source that your health care provider recommends, such as a moist heat pack or a heating pad. ? Place a towel between your skin and the heat source. ? Leave the heat on for 20-30 minutes. ? Remove the heat if your skin turns bright red. This is especially important if you are unable to feel pain, heat, or cold. You have a greater risk of getting burned.  If directed, apply ice to the injured area: ? Put ice in a plastic bag. ? Place a towel between your skin and the bag. ? Leave the ice on for 20 minutes, 2-3 times a day for the first 2-3 days. Activity  Do not stay in bed. Resting more than 1-2 days can delay your recovery.  Take short walks on even surfaces as soon  as you are able. Try to increase the length of time you walk each day.  Do not sit, drive, or stand in one place for more than 30 minutes at a time. Sitting or standing for long periods of time can put stress on your back.  Use proper lifting techniques. When you bend and lift, use positions that put less stress on your back: ? [place] your knees. ? Keep the load close to your body. ? Avoid twisting.  Exercise regularly as told by your health care provider. Exercising will help your back heal faster. This also helps prevent back injuries by keeping muscles strong and flexible.  Your health care provider may recommend that you see a physical therapist. This person can help you come up with a safe exercise program. Do any exercises as told by your physical therapist. Lifestyle  Maintain a healthy weight. Extra weight puts stress on your back and makes it difficult to have good posture.  Avoid  activities or situations that make you feel anxious or stressed. Learn ways to manage anxiety and stress. One way to manage stress is through exercise. Stress and anxiety increase muscle tension and can make back pain worse. General instructions  Sleep on a firm mattress in a comfortable position. Try lying on your side with your knees slightly bent. If you lie on your back, put a pillow under your knees.  Follow your treatment plan as told by your health care provider. This may include: ? Cognitive or behavioral therapy. ? Acupuncture or massage therapy. ? Meditation or yoga. Contact a health care provider if:  You have pain that is not relieved with rest or medicine.  You have increasing pain going down into your legs or buttocks.  Your pain does not improve in 2 weeks.  You have pain at night.  You lose weight.  You have a fever or chills. Get help right away if:  You develop new bowel or bladder control problems.  You have unusual weakness or numbness in your arms or legs.  You develop nausea or vomiting.  You develop abdominal pain.  You feel faint. Summary  Many adults have back pain from time to time. A physical exam, lab tests, and imaging studies may be done to find the cause of your pain.  Use proper lifting techniques. When you bend and lift, use positions that put less stress on your back.  Take over-the-counter and prescription medicines and apply heat or ice as directed by your health care provider. This information is not intended to replace advice given to you by your health care provider. Make sure you discuss any questions you have with your health care provider. Document Released: 07/22/2005 Document Revised: 08/26/2016 Document Reviewed: 08/26/2016 Elsevier Interactive Patient Education  2018 [employer].  Knee Pain, Adult Knee pain in adults is common. It can be caused by many things, including:  Arthritis.  A fluid-filled sac (cyst) or growth  in your knee.  An infection in your knee.  An injury that will not heal.  Damage, swelling, or irritation of the tissues that support your knee.  Knee pain is usually not a sign of a serious problem. The pain may go away on its own with time and rest. If it does not, a health care provider may order tests to find the cause of the pain. These may include:  Imaging tests, such as an X-ray, MRI, or ultrasound.  Joint aspiration. In this test, fluid is removed from the knee.  Arthroscopy. In this test, a lighted tube is inserted into knee and an image is projected onto a TV screen.  A biopsy. In this test, a sample of tissue is removed from the body and studied under a microscope.  Follow these instructions at home: Pay attention to any changes in your symptoms. Take these actions to relieve your pain. Activity  Rest your knee.  Do not do things that cause pain or make pain worse.  Avoid high-impact activities or exercises, such as running, jumping rope, or doing jumping jacks. General instructions  Take over-the-counter and prescription medicines only as told by your health care provider.  Raise (elevate) your knee above the level of your heart when you are sitting or lying down.  Sleep with a pillow under your knee.  If directed, apply ice to the knee: ? Put ice in a plastic bag. ? Place a towel between your skin and the bag. ? Leave the ice on for 20 minutes, 2-3 times a day.  Ask your health care provider if you should wear an elastic knee support.  Lose weight if you are overweight. Extra weight can put pressure on your knee.  Do not use any products that contain nicotine or tobacco, such as cigarettes and e-cigarettes. Smoking may slow the healing of any bone and joint problems that you may have. If you need help quitting, ask your health care provider. Contact a health care provider if:  Your knee pain continues, changes, or gets worse.  You have a fever along with  knee pain.  Your knee buckles or locks up.  Your knee swells, and the swelling becomes worse. Get help right away if:  Your knee feels warm to the touch.  You cannot move your knee.  You have severe pain in your knee.  You have chest pain.  You have trouble breathing. Summary  Knee pain in adults is common. It can be caused by many things, including, arthritis, infection, cysts, or injury.  Knee pain is usually not a sign of a serious problem, but if it does not go away, a health care provider may perform tests to know the cause of the pain.  Pay attention to any changes in your symptoms. Relieve your pain with rest, medicines, light activity, and use of ice.  Get help if your pain continues or becomes very severe, or if your knee buckles or locks up, or if you have chest pain or trouble breathing. This information is not intended to replace advice given to you by your health care provider. Make sure you discuss any questions you have with your health care provider. Document Released: 05/19/2007 Document Revised: 07/12/2016 Document Reviewed: 07/12/2016 Elsevier Interactive Patient Education  [employer].

## 2018-04-20 NOTE — Progress Notes (Signed)
Pre visit review using our clinic review tool, if applicable. No additional management support is needed unless otherwise documented below in the visit note. 

## 2018-04-20 NOTE — Progress Notes (Signed)
Chief Complaint  Patient presents with  . Urinary Tract Infection   ED f/u from 04/19/18  1. C/o urinary incontinence, dysuria UA done ED 04/19/18 abnormal but no culture sent off will do culture today. Ed Rx omnicef bid x 10 days  2. C/o chronic back pain and issues since age 52s 2/2 slipped disc. Now she is losing her balance, legs feel weak, and she is afraid to lose balance in the shower so waiting on shower chair Rx and rolling walker with brakes and seat. Today using rolling walker temporary one. Pain is 4/10 worse as the day goes on.  3. C/o diarrhea new nothing tried advised this could be side effect of Abx  4. Insomnia on ambien 5 mg qd she will wake up at night and unable to go back to sleep.   5. C/o n/v and little appetite and reduced po. Overall lost ~9 lbs.  EGD reviewed 07/20/13 mild chronic inactive gastritis neg H pylori she is on prilosec 40 mg qd. She still is having a lot of anxiety about whether or not [facility] money from her deceased husband will become available to her if not she will not be able to stay at her facility  6. HTN sl elevated today  7. Hypothyroidism on levothyroxine 100 mcg qd  8. C/o left knee pain s/p knee surgery with Dr. Alvan Dame in [place]   Review of Systems  Constitutional: Positive for weight loss.  HENT: Negative for hearing loss.   Eyes: Negative for blurred vision.  Respiratory: Negative for shortness of breath.   Cardiovascular: Negative for chest pain.  Gastrointestinal: Positive for diarrhea.       +reduced po   Genitourinary:       +overactive bladder   Musculoskeletal: Positive for back pain.  Skin: Negative for rash.  Neurological: Positive for weakness.       +b/l leg weakness   Psychiatric/Behavioral: The patient is nervous/anxious.    Past Medical History:  Diagnosis Date  . Allergy   . Arthritis   . Coronary artery disease 04/2017   Mild to moderate CAD in LAD/diagonal by CTA (CT-FFR of apical LAD 0.79).  . Depression   . Diverticulosis    . Essential hypertension    Normal cardiolite 05/2006 EF 71%  . GERD (gastroesophageal reflux disease)   . Headache   . History of shingles   . Hyperlipidemia   . Hypothyroidism   . Mini stroke ([place]) 2011   . Occipital neuralgia   . Prediabetes   . Stroke ([place])   . Stroke [facility])    MRI 04/2008 + left sup. frontal gyrus possibly puntate infarct   . Urinary tract infection   . Vitamin D deficiency    Past Surgical History:  Procedure Laterality Date  . ABDOMINAL HYSTERECTOMY    . BLADDER SURGERY     2003  . BREAST BIOPSY Right Over 20 years    Benign  . CHOLECYSTECTOMY    . gastroplication     . JOINT REPLACEMENT Left   . KNEE ARTHROSCOPY Left 2011  . TONSILLECTOMY AND ADENOIDECTOMY    . TOTAL KNEE ARTHROPLASTY Left 04/17/2015   Procedure: LEFT TOTAL KNEE ARTHROPLASTY;  Surgeon: Paralee Cancel, MD;  Location: [facility];  Service: Orthopedics;  Laterality: Left;   Family History  Problem Relation Age of Onset  . Heart disease Mother   . Hypertension Mother   . Diabetes Mother   . Heart attack Mother 18  . Heart disease Father   .  Heart attack Father 50  . Breast cancer Maternal Aunt   . Heart attack Brother    Social History   Socioeconomic History  . Marital status: Widowed    Spouse name: Not on file  . Number of children: Not on file  . Years of education: Not on file  . Highest education level: Not on file  Occupational History  . Not on file  Social Needs  . Financial resource strain: Not on file  . Food insecurity:    Worry: Not on file    Inability: Not on file  . Transportation needs:    Medical: Not on file    Non-medical: Not on file  Tobacco Use  . Smoking status: Passive Smoke Exposure - Never Smoker  . Smokeless tobacco: Never Used  . Tobacco comment: husbands and children smoked in home.   Substance and Sexual Activity  . Alcohol use: No  . Drug use: No  . Sexual activity: Never  Lifestyle  . Physical activity:    Days per week: Not on file     Minutes per session: Not on file  . Stress: Not on file  Relationships  . Social connections:    Talks on phone: Not on file    Gets together: Not on file    Attends religious service: Not on file    Active member of club or organization: Not on file    Attends meetings of clubs or organizations: Not on file    Relationship status: Not on file  . Intimate partner violence:    Fear of current or ex partner: Not on file    Emotionally abused: Not on file    Physically abused: Not on file    Forced sexual activity: Not on file  Other Topics Concern  . Not on file  Social History Narrative   Widowed    [place]ives [place]    Has kids and grandson    Current Meds  Medication Sig  . aspirin EC 81 MG tablet Take 1 tablet (81 mg total) by mouth daily.  Marland Kitchen atorvastatin (LIPITOR) 20 MG tablet Take 1 tablet (20 mg total) by mouth daily.  . Biotin 1 MG CAPS Take 1 mg by mouth daily.   . cefdinir (OMNICEF) 300 MG capsule Take 1 capsule (300 mg total) by mouth 2 (two) times daily.  . Cholecalciferol (VITAMIN D3) 5000 units TABS Take 5,000 Units by mouth daily.  Marland Kitchen diltiazem (CARDIZEM SR) 60 MG 12 hr capsule Take 1 capsule (60 mg total) by mouth 2 (two) times daily.  Marland Kitchen escitalopram (LEXAPRO) 5 MG tablet Take 5 mg by mouth daily.  . folic acid (FOLVITE) 888 MCG tablet Take 400 mcg by mouth daily.  . hydrALAZINE (APRESOLINE) 10 MG tablet Take 1 tablet (10 mg total) by mouth 2 (two) times daily as needed. BP>140/>90  . hydrochlorothiazide (MICROZIDE) 12.5 MG capsule Take 1 capsule (12.5 mg total) by mouth daily. In am  . levothyroxine (SYNTHROID, LEVOTHROID) 100 MCG tablet Take 1 tablet (100 mcg total) by mouth daily before breakfast.  . LORazepam (ATIVAN) 0.5 MG tablet Take 1 tablet (0.5 mg total) by mouth 2 (two) times daily as needed for anxiety or sleep. Or 1 mg at night for sleep  . omeprazole (PRILOSEC) 40 MG capsule Take 1 capsule (40 mg total) by mouth daily.  Marland Kitchen tiZANidine (ZANAFLEX) 4 MG  tablet Take 1 tablet (4 mg total) by mouth 2 (two) times daily as needed for muscle spasms.  Marland Kitchen  zolpidem (AMBIEN) 5 MG tablet Take 1 tablet (5 mg total) by mouth at bedtime as needed for sleep.   No Known Allergies Recent Results (from the past 2160 hour(s))  TSH     Status: None   Collection Time: 02/12/18 11:13 AM  Result Value Ref Range   TSH 0.49 0.35 - 4.50 uIU/mL  Lipase, blood     Status: None   Collection Time: 04/19/18 10:56 AM  Result Value Ref Range   Lipase 36 11 - 51 U/L    Comment: Performed at [facility], [address] [place], [place] [address]  Comprehensive metabolic panel     Status: Abnormal   Collection Time: 04/19/18 10:56 AM  Result Value Ref Range   Sodium 138 135 - 145 mmol/L   Potassium 3.6 3.5 - 5.1 mmol/L   Chloride 104 98 - 111 mmol/L   CO2 22 22 - 32 mmol/L   Glucose, Bld 125 (H) 70 - 99 mg/dL   BUN 20 8 - 23 mg/dL   Creatinine, Ser 0.89 0.44 - 1.00 mg/dL   Calcium 9.7 8.9 - 10.3 mg/dL   Total Protein 7.7 6.5 - 8.1 g/dL   Albumin 4.2 3.5 - 5.0 g/dL   AST 24 15 - 41 U/L   ALT 19 0 - 44 U/L   Alkaline Phosphatase 86 38 - 126 U/L   Total Bilirubin 1.0 0.3 - 1.2 mg/dL   GFR calc non Af Amer >60 >60 mL/min   GFR calc Af Amer >60 >60 mL/min    Comment: (NOTE) The eGFR has been calculated using the CKD EPI equation. This calculation has not been validated in all clinical situations. eGFR's persistently <60 mL/min signify possible Chronic Kidney Disease.    Anion gap 12 5 - 15    Comment: Performed at [facility], [address] [place], [place] [address]  CBC     Status: None   Collection Time: 04/19/18 10:56 AM  Result Value Ref Range   WBC 5.3 3.6 - 11.0 K/uL   RBC 4.43 3.80 - 5.20 MIL/uL   Hemoglobin 13.9 12.0 - 16.0 g/dL   HCT 39.6 35.0 - 47.0 %   MCV 89.5 80.0 - 100.0 fL   MCH 31.4 26.0 - 34.0 pg   MCHC 35.1 32.0 - 36.0 g/dL   RDW 12.9 11.5 - 14.5 %   Platelets 170 150 - 440 K/uL    Comment: Performed at  [facility], [address] [place], [place] [address]  Urinalysis, Complete w Microscopic     Status: Abnormal   Collection Time: 04/19/18 10:56 AM  Result Value Ref Range   Color, Urine AMBER (A) YELLOW    Comment: BIOCHEMICALS MAY BE AFFECTED BY COLOR   APPearance CLOUDY (A) CLEAR   Specific Gravity, Urine 1.027 1.005 - 1.030   pH 5.0 5.0 - 8.0   Glucose, UA NEGATIVE NEGATIVE mg/dL   Hgb urine dipstick NEGATIVE NEGATIVE   Bilirubin Urine NEGATIVE NEGATIVE   Ketones, ur 5 (A) NEGATIVE mg/dL   Protein, ur 30 (A) NEGATIVE mg/dL   Nitrite POSITIVE (A) NEGATIVE   Leukocytes, UA MODERATE (A) NEGATIVE   RBC / HPF 6-10 0 - 5 RBC/hpf   WBC, UA >50 (H) 0 - 5 WBC/hpf   Bacteria, UA MANY (A) NONE SEEN   Squamous Epithelial / LPF 6-10 0 - 5   WBC Clumps PRESENT    Mucus PRESENT    Ca Oxalate Crys, UA PRESENT  Comment: Performed at [facility], [address] [place], [place] [address]   Objective  Body mass index is 28.98 kg/m. Wt Readings from Last 3 Encounters:  04/20/18 163 lb 9.6 oz (74.2 kg)  04/19/18 152 lb (68.9 kg)  03/10/18 169 lb 9.6 oz (76.9 kg)   Temp Readings from Last 3 Encounters:  04/20/18 98.7 F (37.1 C) (Oral)  04/19/18 97.9 F (36.6 C) (Oral)  03/10/18 98.3 F (36.8 C) (Oral)   BP Readings from Last 3 Encounters:  04/20/18 (!) 146/96  04/19/18 (!) 159/90  03/10/18 (!) 142/80   Pulse Readings from Last 3 Encounters:  04/20/18 82  04/19/18 60  03/10/18 75    Physical Exam  Constitutional: She is oriented to person, place, and time. Vital signs are normal. She appears well-developed and well-nourished. She is cooperative.  HENT:  Head: Normocephalic and atraumatic.  Mouth/Throat: Oropharynx is clear and moist and mucous membranes are normal.  Eyes: Pupils are equal, round, and reactive to light. Conjunctivae are normal.  Cardiovascular: Normal rate, regular rhythm and normal heart sounds.  Pulmonary/Chest: Effort normal and  breath sounds normal.  Musculoskeletal:       Lumbar back: She exhibits tenderness.  Neurological: She is alert and oriented to person, place, and time.  Using walker today   Skin: Skin is warm, dry and intact.  Psychiatric: She has a normal mood and affect. Her speech is normal and behavior is normal. Judgment and thought content normal. Cognition and memory are normal.  Nursing note and vitals reviewed.   Assessment   1. C/w UTI  2. Chronic back pain with leg weakness and radiculopathy  3. Diarrhea could be related to Ab for #1 will stop Abx as culture was negative  4. Insomnia  5. N/v/reduced po could be related to anxiety see HPI EGD 07/20/13 chronic inactive gastritis  6. HTN 7. Hypothyroidism  8. L knee pain s/p knee surgery Dr. Alvan Dame 9. HM Plan   1. Urine culture today neg  Stop Abx  Reviewed labs UA, CMET nl except cbg 125, CBC nl 04/19/18 2. MRI L spine Depo 40 x 1  Consider NS or ortho spine in future pending MRI  3. Hold Abx, yogurt, prn immodium If needed  4. Change ambien 5 mg to CR 6.25 qhs prn  If this does not help consider change ativan to klonopin qhs prn use  5. Monitor see HPI  On PPI  6. Cont meds dilt 60 bid, prn hydralazine, hctz 12.5 mg qd, monitor BP  7. Check labs upcoming TSH Cont levo 100 mcg qd  8. Xray left knee today  -s/p knee surgery no abnormality seen  Consider refer back to ortho Dr. Alvan Dame if sx's continue  9.  Had flu shot8/10/18 will need for this year  Tdap consider in future unable to find record  pna 23 utd  prevnar had 06/16/15  Given Rx shingrix in the past. pt has h/o shingles  Consider hep B vaccine   Check A1C h/o hyperglycemia   colonoscopy had 06/03/11 Dr. Michail Sermon diverticulosis f/u in 10 years  mammo [facility] 04/08/17 negwill refer locally at f/u   Out of age window pap last 01/26/10 neg   dexa 04/13/15 osteopenia consider repeat in future 3-5 years check vitamin D  hcv neg  Lipid had 08/12/17 reviewed CT chest due  04/2018 sch for pt 07/2018  Will need dermatology in future$ is issue currently    Provider: Dr. Olivia Mackie McLean-[facility]ocuzza-[facility]

## 2018-04-21 LAB — URINE CULTURE
MICRO NUMBER:: 91107534
Result:: NO GROWTH
SPECIMEN QUALITY:: ADEQUATE

## 2018-04-22 ENCOUNTER — Telehealth: Payer: Self-pay

## 2018-04-22 ENCOUNTER — Telehealth: Payer: Self-pay | Admitting: Internal Medicine

## 2018-04-22 DIAGNOSIS — M545 Low back pain, unspecified: Secondary | ICD-10-CM | POA: Insufficient documentation

## 2018-04-22 DIAGNOSIS — G8929 Other chronic pain: Secondary | ICD-10-CM | POA: Insufficient documentation

## 2018-04-22 DIAGNOSIS — M25562 Pain in left knee: Secondary | ICD-10-CM

## 2018-04-22 NOTE — Telephone Encounter (Signed)
Advise pt stop Omnicef antibiotic no UTI   TMS

## 2018-04-22 NOTE — Telephone Encounter (Signed)
Copied from CRM [id number]. Topic: General - Other >> Apr 22, 2018  2:20 PM Gaynelle Adu wrote: Reason for CRM: Rosey Bath [facility]- [facility]  Requesting the last 04-20-18 OV documentation  be faxed to start PT with the patient.  Fax number: [phone]  [facility] contact number is : [phone], Advise its Secure to leave VM  Last office visit note faxed to number given above.

## 2018-04-23 ENCOUNTER — Telehealth: Payer: Self-pay

## 2018-04-23 NOTE — Telephone Encounter (Signed)
Copied from CRM [id number]. Topic: Quick Communication - See Telephone Encounter >> Apr 23, 2018  3:07 PM Herby Abraham C wrote: CRM for notification. See Telephone encounter for: 04/23/18.  Pt is calling in to request her imaging results. [phone]

## 2018-05-03 ENCOUNTER — Ambulatory Visit: Payer: Medicare HMO

## 2018-05-03 DIAGNOSIS — M5136 Other intervertebral disc degeneration, lumbar region: Secondary | ICD-10-CM | POA: Diagnosis not present

## 2018-05-03 DIAGNOSIS — I2510 Atherosclerotic heart disease of native coronary artery without angina pectoris: Secondary | ICD-10-CM | POA: Diagnosis not present

## 2018-05-03 DIAGNOSIS — F329 Major depressive disorder, single episode, unspecified: Secondary | ICD-10-CM | POA: Diagnosis not present

## 2018-05-03 DIAGNOSIS — I10 Essential (primary) hypertension: Secondary | ICD-10-CM | POA: Diagnosis not present

## 2018-05-03 DIAGNOSIS — Z87440 Personal history of urinary (tract) infections: Secondary | ICD-10-CM | POA: Diagnosis not present

## 2018-05-03 DIAGNOSIS — M1991 Primary osteoarthritis, unspecified site: Secondary | ICD-10-CM | POA: Diagnosis not present

## 2018-05-03 DIAGNOSIS — E039 Hypothyroidism, unspecified: Secondary | ICD-10-CM | POA: Diagnosis not present

## 2018-05-04 ENCOUNTER — Telehealth: Payer: Self-pay | Admitting: Internal Medicine

## 2018-05-04 NOTE — Telephone Encounter (Signed)
ERROR

## 2018-05-04 NOTE — Telephone Encounter (Signed)
Copied from CRM [id number]. Topic: Quick Communication - See Telephone Encounter >> May 04, 2018  1:41 PM Lorayne Bender wrote: CRM for notification. See Telephone encounter for: 05/04/18.  Maria from [facility] calling to request verbal PT orders: 2x a week for 4 weeks Kimberly Cook can be reached at [phone]

## 2018-05-04 NOTE — Telephone Encounter (Signed)
Called and left voicemail with verbal orders for Byrd Hesselbach with [facility] for PT for patient and for her to call the office if she has any questions or concerns.

## 2018-05-05 ENCOUNTER — Other Ambulatory Visit: Payer: Medicare HMO

## 2018-05-07 ENCOUNTER — Ambulatory Visit: Payer: Medicare HMO

## 2018-05-11 DIAGNOSIS — M1991 Primary osteoarthritis, unspecified site: Secondary | ICD-10-CM | POA: Diagnosis not present

## 2018-05-11 DIAGNOSIS — I2510 Atherosclerotic heart disease of native coronary artery without angina pectoris: Secondary | ICD-10-CM | POA: Diagnosis not present

## 2018-05-11 DIAGNOSIS — Z87440 Personal history of urinary (tract) infections: Secondary | ICD-10-CM | POA: Diagnosis not present

## 2018-05-11 DIAGNOSIS — I10 Essential (primary) hypertension: Secondary | ICD-10-CM | POA: Diagnosis not present

## 2018-05-11 DIAGNOSIS — E039 Hypothyroidism, unspecified: Secondary | ICD-10-CM | POA: Diagnosis not present

## 2018-05-11 DIAGNOSIS — F329 Major depressive disorder, single episode, unspecified: Secondary | ICD-10-CM | POA: Diagnosis not present

## 2018-05-11 DIAGNOSIS — M5136 Other intervertebral disc degeneration, lumbar region: Secondary | ICD-10-CM | POA: Diagnosis not present

## 2018-05-12 DIAGNOSIS — I10 Essential (primary) hypertension: Secondary | ICD-10-CM | POA: Diagnosis not present

## 2018-05-12 DIAGNOSIS — M1991 Primary osteoarthritis, unspecified site: Secondary | ICD-10-CM | POA: Diagnosis not present

## 2018-05-12 DIAGNOSIS — M5136 Other intervertebral disc degeneration, lumbar region: Secondary | ICD-10-CM | POA: Diagnosis not present

## 2018-05-12 DIAGNOSIS — Z87440 Personal history of urinary (tract) infections: Secondary | ICD-10-CM | POA: Diagnosis not present

## 2018-05-12 DIAGNOSIS — E039 Hypothyroidism, unspecified: Secondary | ICD-10-CM | POA: Diagnosis not present

## 2018-05-12 DIAGNOSIS — F329 Major depressive disorder, single episode, unspecified: Secondary | ICD-10-CM | POA: Diagnosis not present

## 2018-05-12 DIAGNOSIS — I2510 Atherosclerotic heart disease of native coronary artery without angina pectoris: Secondary | ICD-10-CM | POA: Diagnosis not present

## 2018-05-13 DIAGNOSIS — I10 Essential (primary) hypertension: Secondary | ICD-10-CM | POA: Diagnosis not present

## 2018-05-13 DIAGNOSIS — E039 Hypothyroidism, unspecified: Secondary | ICD-10-CM | POA: Diagnosis not present

## 2018-05-13 DIAGNOSIS — M1991 Primary osteoarthritis, unspecified site: Secondary | ICD-10-CM | POA: Diagnosis not present

## 2018-05-13 DIAGNOSIS — I2510 Atherosclerotic heart disease of native coronary artery without angina pectoris: Secondary | ICD-10-CM | POA: Diagnosis not present

## 2018-05-13 DIAGNOSIS — F329 Major depressive disorder, single episode, unspecified: Secondary | ICD-10-CM | POA: Diagnosis not present

## 2018-05-13 DIAGNOSIS — M5136 Other intervertebral disc degeneration, lumbar region: Secondary | ICD-10-CM | POA: Diagnosis not present

## 2018-05-13 DIAGNOSIS — Z87440 Personal history of urinary (tract) infections: Secondary | ICD-10-CM | POA: Diagnosis not present

## 2018-05-19 ENCOUNTER — Other Ambulatory Visit (INDEPENDENT_AMBULATORY_CARE_PROVIDER_SITE_OTHER): Payer: Medicare HMO

## 2018-05-19 DIAGNOSIS — E039 Hypothyroidism, unspecified: Secondary | ICD-10-CM | POA: Diagnosis not present

## 2018-05-19 DIAGNOSIS — E538 Deficiency of other specified B group vitamins: Secondary | ICD-10-CM

## 2018-05-19 DIAGNOSIS — E559 Vitamin D deficiency, unspecified: Secondary | ICD-10-CM | POA: Diagnosis not present

## 2018-05-19 DIAGNOSIS — R739 Hyperglycemia, unspecified: Secondary | ICD-10-CM

## 2018-05-19 LAB — HEMOGLOBIN A1C: Hgb A1c MFr Bld: 5 % (ref 4.6–6.5)

## 2018-05-19 LAB — TSH: TSH: 0.02 u[IU]/mL — ABNORMAL LOW (ref 0.35–4.50)

## 2018-05-19 LAB — VITAMIN D 25 HYDROXY (VIT D DEFICIENCY, FRACTURES): VITD: 70.87 ng/mL (ref 30.00–100.00)

## 2018-05-19 LAB — VITAMIN B12: Vitamin B-12: 248 pg/mL (ref 211–911)

## 2018-05-19 LAB — MAGNESIUM: Magnesium: 1.9 mg/dL (ref 1.5–2.5)

## 2018-05-20 DIAGNOSIS — M5136 Other intervertebral disc degeneration, lumbar region: Secondary | ICD-10-CM | POA: Diagnosis not present

## 2018-05-20 DIAGNOSIS — Z87440 Personal history of urinary (tract) infections: Secondary | ICD-10-CM | POA: Diagnosis not present

## 2018-05-20 DIAGNOSIS — F329 Major depressive disorder, single episode, unspecified: Secondary | ICD-10-CM | POA: Diagnosis not present

## 2018-05-20 DIAGNOSIS — I10 Essential (primary) hypertension: Secondary | ICD-10-CM | POA: Diagnosis not present

## 2018-05-20 DIAGNOSIS — M1991 Primary osteoarthritis, unspecified site: Secondary | ICD-10-CM | POA: Diagnosis not present

## 2018-05-20 DIAGNOSIS — E039 Hypothyroidism, unspecified: Secondary | ICD-10-CM | POA: Diagnosis not present

## 2018-05-20 DIAGNOSIS — I2510 Atherosclerotic heart disease of native coronary artery without angina pectoris: Secondary | ICD-10-CM | POA: Diagnosis not present

## 2018-05-21 ENCOUNTER — Other Ambulatory Visit: Payer: Self-pay | Admitting: Internal Medicine

## 2018-05-21 DIAGNOSIS — E039 Hypothyroidism, unspecified: Secondary | ICD-10-CM | POA: Diagnosis not present

## 2018-05-21 DIAGNOSIS — I2510 Atherosclerotic heart disease of native coronary artery without angina pectoris: Secondary | ICD-10-CM | POA: Diagnosis not present

## 2018-05-21 DIAGNOSIS — I10 Essential (primary) hypertension: Secondary | ICD-10-CM

## 2018-05-21 DIAGNOSIS — F329 Major depressive disorder, single episode, unspecified: Secondary | ICD-10-CM | POA: Diagnosis not present

## 2018-05-21 DIAGNOSIS — M5136 Other intervertebral disc degeneration, lumbar region: Secondary | ICD-10-CM | POA: Diagnosis not present

## 2018-05-21 DIAGNOSIS — R002 Palpitations: Secondary | ICD-10-CM

## 2018-05-21 DIAGNOSIS — M1991 Primary osteoarthritis, unspecified site: Secondary | ICD-10-CM | POA: Diagnosis not present

## 2018-05-21 DIAGNOSIS — Z87440 Personal history of urinary (tract) infections: Secondary | ICD-10-CM | POA: Diagnosis not present

## 2018-05-21 MED ORDER — DILTIAZEM HCL ER 60 MG PO CP12: 60.0000 mg | ORAL_CAPSULE | Freq: Two times a day (BID) | ORAL | 0 refills | Status: DC

## 2018-05-21 MED ORDER — LEVOTHYROXINE SODIUM 88 MCG PO TABS: 88.0000 ug | ORAL_TABLET | Freq: Every day | ORAL | 0 refills | Status: DC

## 2018-05-21 MED ORDER — DILTIAZEM HCL ER 60 MG PO CP12: 60.0000 mg | ORAL_CAPSULE | Freq: Two times a day (BID) | ORAL | 3 refills | Status: DC

## 2018-05-22 ENCOUNTER — Other Ambulatory Visit: Payer: Self-pay | Admitting: Internal Medicine

## 2018-05-22 DIAGNOSIS — I10 Essential (primary) hypertension: Secondary | ICD-10-CM

## 2018-05-22 MED ORDER — DILTIAZEM HCL ER 60 MG PO CP12: 60.0000 mg | ORAL_CAPSULE | Freq: Two times a day (BID) | ORAL | 0 refills | Status: DC

## 2018-05-22 MED ORDER — DILTIAZEM HCL ER 60 MG PO CP12: 60.0000 mg | ORAL_CAPSULE | Freq: Two times a day (BID) | ORAL | 3 refills | Status: DC

## 2018-05-26 DIAGNOSIS — M5136 Other intervertebral disc degeneration, lumbar region: Secondary | ICD-10-CM | POA: Diagnosis not present

## 2018-05-26 DIAGNOSIS — F329 Major depressive disorder, single episode, unspecified: Secondary | ICD-10-CM | POA: Diagnosis not present

## 2018-05-26 DIAGNOSIS — I10 Essential (primary) hypertension: Secondary | ICD-10-CM | POA: Diagnosis not present

## 2018-05-26 DIAGNOSIS — E039 Hypothyroidism, unspecified: Secondary | ICD-10-CM | POA: Diagnosis not present

## 2018-05-26 DIAGNOSIS — M1991 Primary osteoarthritis, unspecified site: Secondary | ICD-10-CM | POA: Diagnosis not present

## 2018-05-26 DIAGNOSIS — Z87440 Personal history of urinary (tract) infections: Secondary | ICD-10-CM | POA: Diagnosis not present

## 2018-05-26 DIAGNOSIS — I2510 Atherosclerotic heart disease of native coronary artery without angina pectoris: Secondary | ICD-10-CM | POA: Diagnosis not present

## 2018-05-28 DIAGNOSIS — F329 Major depressive disorder, single episode, unspecified: Secondary | ICD-10-CM | POA: Diagnosis not present

## 2018-05-28 DIAGNOSIS — I2510 Atherosclerotic heart disease of native coronary artery without angina pectoris: Secondary | ICD-10-CM | POA: Diagnosis not present

## 2018-05-28 DIAGNOSIS — Z87440 Personal history of urinary (tract) infections: Secondary | ICD-10-CM | POA: Diagnosis not present

## 2018-05-28 DIAGNOSIS — M5136 Other intervertebral disc degeneration, lumbar region: Secondary | ICD-10-CM | POA: Diagnosis not present

## 2018-05-28 DIAGNOSIS — M1991 Primary osteoarthritis, unspecified site: Secondary | ICD-10-CM | POA: Diagnosis not present

## 2018-05-28 DIAGNOSIS — E039 Hypothyroidism, unspecified: Secondary | ICD-10-CM | POA: Diagnosis not present

## 2018-05-28 DIAGNOSIS — I10 Essential (primary) hypertension: Secondary | ICD-10-CM | POA: Diagnosis not present

## 2018-06-26 ENCOUNTER — Ambulatory Visit (INDEPENDENT_AMBULATORY_CARE_PROVIDER_SITE_OTHER): Payer: Medicare HMO | Admitting: Internal Medicine

## 2018-06-26 ENCOUNTER — Encounter: Payer: Self-pay | Admitting: Internal Medicine

## 2018-06-26 VITALS — BP 166/96 | HR 85 | Temp 98.6°F | Ht 63.0 in | Wt 165.0 lb

## 2018-06-26 DIAGNOSIS — N3000 Acute cystitis without hematuria: Secondary | ICD-10-CM | POA: Diagnosis not present

## 2018-06-26 DIAGNOSIS — R269 Unspecified abnormalities of gait and mobility: Secondary | ICD-10-CM

## 2018-06-26 DIAGNOSIS — R42 Dizziness and giddiness: Secondary | ICD-10-CM

## 2018-06-26 DIAGNOSIS — G4700 Insomnia, unspecified: Secondary | ICD-10-CM | POA: Diagnosis not present

## 2018-06-26 DIAGNOSIS — N3941 Urge incontinence: Secondary | ICD-10-CM | POA: Diagnosis not present

## 2018-06-26 DIAGNOSIS — R300 Dysuria: Secondary | ICD-10-CM | POA: Diagnosis not present

## 2018-06-26 DIAGNOSIS — R350 Frequency of micturition: Secondary | ICD-10-CM

## 2018-06-26 DIAGNOSIS — I10 Essential (primary) hypertension: Secondary | ICD-10-CM | POA: Diagnosis not present

## 2018-06-26 MED ORDER — PHENAZOPYRIDINE HCL 200 MG PO TABS: 200.0000 mg | ORAL_TABLET | Freq: Three times a day (TID) | ORAL | 0 refills | Status: DC | PRN

## 2018-06-26 MED ORDER — HYDRALAZINE HCL 10 MG PO TABS: 20.0000 mg | ORAL_TABLET | Freq: Two times a day (BID) | ORAL | 0 refills | Status: DC | PRN

## 2018-06-26 MED ORDER — ZOLPIDEM TARTRATE ER 6.25 MG PO TBCR: 6.2500 mg | EXTENDED_RELEASE_TABLET | Freq: Every evening | ORAL | 2 refills | Status: DC | PRN

## 2018-06-26 MED ORDER — CIPROFLOXACIN HCL 500 MG PO TABS: 500.0000 mg | ORAL_TABLET | Freq: Two times a day (BID) | ORAL | 0 refills | Status: DC

## 2018-06-26 NOTE — Progress Notes (Signed)
Chief Complaint  Patient presents with  . Urinary Tract Infection   Acute visit and to fill out [facility] paperwork  1. C/w UTI with urgency, frequency and dysuria x 2 weeks going to bathroom 3-4 x at night nothing tried other than cranberry juice  2. VA paperwork needed to get deceased veterans finances from [place] to be able to stay in [facility]. She has limited finances which is stressful to her as she may lose her living in [place] and is dependant on [place] and asks I fill out paperwork today. She reports someone at [facility] wrote a letter to the [place] which she brings in today to show me that she did not deserve to stay there another resident. She reports due to having arthritis in neck, knees, hands, back which is worsening she is having to use a shower chair, currently in PT and feeling off balance abnormal gait having to use a rollator most of the time which is new for her. Due to stress of above her BP fluctuates and is uncontrolled. She also c/o memory problems and she has help from [facility] who comes to [facility] to help her with medications. She needs does not think she can cook at meal and stand enough to cook due to arthritis pain and balance/gait issues and [facility] provides meals for her which is helpful.  If she left [facility] there is no where else for her to go and she cant live with her son or daughter and she is widowed.  3. HTN uncontrolled pt thinks due to stress/anxiety on diltiazem 60 mg bid, hydralazine 10 mg bid prn, hctz 12.5 mg qd. At times per pt BP was recently running 140/>100 I.e 115  4. Abnormal gait/dizziness and balance issues. Currently using rollator in PT. Declines MRI/CT brain currently 2/2 cost    Review of Systems  Constitutional: Negative for weight loss.  HENT: Negative for hearing loss.   Eyes: Negative for blurred vision.  Respiratory: Negative for shortness of breath.   Cardiovascular: Negative for chest pain.  Genitourinary: Positive for  dysuria, frequency and urgency.  Musculoskeletal: Positive for back pain, falls, joint pain and neck pain.  Skin: Negative for rash.  Neurological: Positive for dizziness.       +abnormal gait    Psychiatric/Behavioral: The patient is nervous/anxious.        +stress    Past Medical History:  Diagnosis Date  . Allergy   . Arthritis   . Coronary artery disease 04/2017   Mild to moderate CAD in LAD/diagonal by CTA (CT-FFR of apical LAD 0.79).  . Depression   . Diverticulosis   . Essential hypertension    Normal cardiolite 05/2006 EF 71%  . GERD (gastroesophageal reflux disease)   . Headache   . History of shingles   . Hyperlipidemia   . Hypothyroidism   . Mini stroke ([place]) 2011   . Occipital neuralgia   . Prediabetes   . Stroke ([place])   . Stroke [facility])    MRI 04/2008 + left sup. frontal gyrus possibly puntate infarct   . Urinary tract infection   . Vitamin D deficiency    Past Surgical History:  Procedure Laterality Date  . ABDOMINAL HYSTERECTOMY    . BLADDER SURGERY     2003  . BREAST BIOPSY Right Over 20 years    Benign  . CHOLECYSTECTOMY    . gastroplication     . JOINT REPLACEMENT Left   .  KNEE ARTHROSCOPY Left 2011  . TONSILLECTOMY AND ADENOIDECTOMY    . TOTAL KNEE ARTHROPLASTY Left 04/17/2015   Procedure: LEFT TOTAL KNEE ARTHROPLASTY;  Surgeon: Paralee Cancel, MD;  Location: [facility];  Service: Orthopedics;  Laterality: Left;   Family History  Problem Relation Age of Onset  . Heart disease Mother   . Hypertension Mother   . Diabetes Mother   . Heart attack Mother 80  . Heart disease Father   . Heart attack Father 66  . Breast cancer Maternal Aunt   . Heart attack Brother    Social History   Socioeconomic History  . Marital status: Widowed    Spouse name: Not on file  . Number of children: Not on file  . Years of education: Not on file  . Highest education level: Not on file  Occupational History  . Not on file  Social Needs  . Financial resource  strain: Not on file  . Food insecurity:    Worry: Not on file    Inability: Not on file  . Transportation needs:    Medical: Not on file    Non-medical: Not on file  Tobacco Use  . Smoking status: Passive Smoke Exposure - Never Smoker  . Smokeless tobacco: Never Used  . Tobacco comment: husbands and children smoked in home.   Substance and Sexual Activity  . Alcohol use: No  . Drug use: No  . Sexual activity: Never  Lifestyle  . Physical activity:    Days per week: Not on file    Minutes per session: Not on file  . Stress: Not on file  Relationships  . Social connections:    Talks on phone: Not on file    Gets together: Not on file    Attends religious service: Not on file    Active member of club or organization: Not on file    Attends meetings of clubs or organizations: Not on file    Relationship status: Not on file  . Intimate partner violence:    Fear of current or ex partner: Not on file    Emotionally abused: Not on file    Physically abused: Not on file    Forced sexual activity: Not on file  Other Topics Concern  . Not on file  Social History Narrative   Widowed    Lives [facility]    Has kids and grandson    Current Meds  Medication Sig  . aspirin EC 81 MG tablet Take 1 tablet (81 mg total) by mouth daily.  Marland Kitchen atorvastatin (LIPITOR) 20 MG tablet Take 1 tablet (20 mg total) by mouth daily.  . Biotin 1 MG CAPS Take 1 mg by mouth daily.   . cefdinir (OMNICEF) 300 MG capsule Take 1 capsule (300 mg total) by mouth 2 (two) times daily.  . Cholecalciferol (VITAMIN D3) 5000 units TABS Take 5,000 Units by mouth daily.  Marland Kitchen diltiazem (CARDIZEM SR) 60 MG 12 hr capsule Take 1 capsule (60 mg total) by mouth 2 (two) times daily.  Marland Kitchen diltiazem (CARDIZEM SR) 60 MG 12 hr capsule Take 1 capsule (60 mg total) by mouth 2 (two) times daily.  Marland Kitchen escitalopram (LEXAPRO) 5 MG tablet Take 5 mg by mouth daily.  . folic acid (FOLVITE) 932 MCG tablet Take 400 mcg by mouth daily.  .  hydrALAZINE (APRESOLINE) 10 MG tablet Take 1 tablet (10 mg total) by mouth 2 (two) times daily as needed. BP>140/>90  . hydrochlorothiazide (MICROZIDE) 12.5 MG capsule  Take 1 capsule (12.5 mg total) by mouth daily. In am  . levothyroxine (SYNTHROID, LEVOTHROID) 88 MCG tablet Take 1 tablet (88 mcg total) by mouth daily before breakfast. Skip sundays  . LORazepam (ATIVAN) 0.5 MG tablet Take 1 tablet (0.5 mg total) by mouth 2 (two) times daily as needed for anxiety or sleep. Or 1 mg at night for sleep  . omeprazole (PRILOSEC) 40 MG capsule Take 1 capsule (40 mg total) by mouth daily.  Marland Kitchen tiZANidine (ZANAFLEX) 4 MG tablet Take 1 tablet (4 mg total) by mouth 2 (two) times daily as needed for muscle spasms.  Marland Kitchen zolpidem (AMBIEN CR) 6.25 MG CR tablet Take 1 tablet (6.25 mg total) by mouth at bedtime as needed for sleep.   No Known Allergies Recent Results (from the past 2160 hour(s))  Lipase, blood     Status: None   Collection Time: 04/19/18 10:56 AM  Result Value Ref Range   Lipase 36 11 - 51 U/L    Comment: Performed at [facility], [address] [place], [place] [address]  Comprehensive metabolic panel     Status: Abnormal   Collection Time: 04/19/18 10:56 AM  Result Value Ref Range   Sodium 138 135 - 145 mmol/L   Potassium 3.6 3.5 - 5.1 mmol/L   Chloride 104 98 - 111 mmol/L   CO2 22 22 - 32 mmol/L   Glucose, Bld 125 (H) 70 - 99 mg/dL   BUN 20 8 - 23 mg/dL   Creatinine, Ser 0.89 0.44 - 1.00 mg/dL   Calcium 9.7 8.9 - 10.3 mg/dL   Total Protein 7.7 6.5 - 8.1 g/dL   Albumin 4.2 3.5 - 5.0 g/dL   AST 24 15 - 41 U/L   ALT 19 0 - 44 U/L   Alkaline Phosphatase 86 38 - 126 U/L   Total Bilirubin 1.0 0.3 - 1.2 mg/dL   GFR calc non Af Amer >60 >60 mL/min   GFR calc Af Amer >60 >60 mL/min    Comment: (NOTE) The eGFR has been calculated using the CKD EPI equation. This calculation has not been validated in all clinical situations. eGFR's persistently <60 mL/min signify possible Chronic  Kidney Disease.    Anion gap 12 5 - 15    Comment: Performed at [facility], [address] [place], [place] [address]  CBC     Status: None   Collection Time: 04/19/18 10:56 AM  Result Value Ref Range   WBC 5.3 3.6 - 11.0 K/uL   RBC 4.43 3.80 - 5.20 MIL/uL   Hemoglobin 13.9 12.0 - 16.0 g/dL   HCT 39.6 35.0 - 47.0 %   MCV 89.5 80.0 - 100.0 fL   MCH 31.4 26.0 - 34.0 pg   MCHC 35.1 32.0 - 36.0 g/dL   RDW 12.9 11.5 - 14.5 %   Platelets 170 150 - 440 K/uL    Comment: Performed at [facility], [address] [place], [place] [address]  Urinalysis, Complete w Microscopic     Status: Abnormal   Collection Time: 04/19/18 10:56 AM  Result Value Ref Range   Color, Urine AMBER (A) YELLOW    Comment: BIOCHEMICALS MAY BE AFFECTED BY COLOR   APPearance CLOUDY (A) CLEAR   Specific Gravity, Urine 1.027 1.005 - 1.030   pH 5.0 5.0 - 8.0   Glucose, UA NEGATIVE NEGATIVE mg/dL   Hgb urine dipstick NEGATIVE NEGATIVE   Bilirubin Urine NEGATIVE NEGATIVE   Ketones, ur 5 (A) NEGATIVE  mg/dL   Protein, ur 30 (A) NEGATIVE mg/dL   Nitrite POSITIVE (A) NEGATIVE   Leukocytes, UA MODERATE (A) NEGATIVE   RBC / HPF 6-10 0 - 5 RBC/hpf   WBC, UA >50 (H) 0 - 5 WBC/hpf   Bacteria, UA MANY (A) NONE SEEN   Squamous Epithelial / LPF 6-10 0 - 5   WBC Clumps PRESENT    Mucus PRESENT    Ca Oxalate Crys, UA PRESENT     Comment: Performed at [facility], [address], [place], [place] [address]  Urine Culture     Status: None   Collection Time: 04/20/18 11:27 AM  Result Value Ref Range   MICRO NUMBER: [id number]    SPECIMEN QUALITY: ADEQUATE    Sample Source URINE    STATUS: FINAL    Result: No Growth   Hemoglobin A1c     Status: None   Collection Time: 05/19/18  8:22 AM  Result Value Ref Range   Hgb A1c MFr Bld 5.0 4.6 - 6.5 %    Comment: Glycemic Control Guidelines for People with Diabetes:Non Diabetic:  <6%Goal of Therapy: <7%Additional Action Suggested:  >8%   Magnesium      Status: None   Collection Time: 05/19/18  8:22 AM  Result Value Ref Range   Magnesium 1.9 1.5 - 2.5 mg/dL  TSH     Status: Abnormal   Collection Time: 05/19/18  8:22 AM  Result Value Ref Range   TSH 0.02 (L) 0.35 - 4.50 uIU/mL  Vitamin D (25 hydroxy)     Status: None   Collection Time: 05/19/18  8:22 AM  Result Value Ref Range   VITD 70.87 30.00 - 100.00 ng/mL  B12     Status: None   Collection Time: 05/19/18  8:22 AM  Result Value Ref Range   Vitamin B-12 248 211 - 911 pg/mL   Objective  Body mass index is 29.23 kg/m. Wt Readings from Last 3 Encounters:  06/26/18 165 lb (74.8 kg)  04/20/18 163 lb 9.6 oz (74.2 kg)  04/19/18 152 lb (68.9 kg)   Temp Readings from Last 3 Encounters:  06/26/18 98.6 F (37 C) (Oral)  04/20/18 98.7 F (37.1 C) (Oral)  04/19/18 97.9 F (36.6 C) (Oral)   BP Readings from Last 3 Encounters:  06/26/18 (!) 166/96  04/20/18 (!) 146/96  04/19/18 (!) 159/90   Pulse Readings from Last 3 Encounters:  06/26/18 85  04/20/18 82  04/19/18 60    Physical Exam  Constitutional: She is oriented to person, place, and time. Vital signs are normal. She appears well-developed and well-nourished. She is cooperative.  HENT:  Head: Normocephalic and atraumatic.  Mouth/Throat: Oropharynx is clear and moist and mucous membranes are normal.  Eyes: Pupils are equal, round, and reactive to light. Conjunctivae are normal.  Cardiovascular: Normal rate, regular rhythm and normal heart sounds.  Pulmonary/Chest: Effort normal and breath sounds normal.  Neurological: She is alert and oriented to person, place, and time. Gait normal.  Using rollator today    Skin: Skin is warm, dry and intact.  Psychiatric: She has a normal mood and affect. Her speech is normal and behavior is normal. Judgment and thought content normal. Cognition and memory are normal.  Nursing note and vitals reviewed.   Assessment   1. UTI  2. HTN uncontrolled likely 2/2 stress  3. Abnormal  gait, dizziness and balance issues could be multifactorial due to arthritis, DDD, spinal stenosis could be 2/2 #2  4.HM  Plan   1. UA and culture today  cipro for now with pyridium tid prn x 2 days  Cranberry with D mannose supplement otc  2. Cont meds but increase hydralazine to 20 mg bid prn with parameters  3. Cont PT and use of rollator  Disc MRI/CT for dizziness pt c/w cost for now and declines  Filled out paperwork for [facility] and given copy and copy scanned into chart  4.   Flu shot utd Tdap consider in future unable to find record  pna 22 utd  prevnar had 06/16/15  shingrix 1/2 05/22/18  Consider hep B vaccine   A1C 5.0   colonoscopy had 06/03/11 Dr. Michail Sermon diverticulosis f/u in 10 years  mammo [facility] 04/08/17 negwill refer locally at f/u   Out of age window pap last 01/26/10 neg   dexa 04/13/15 osteopenia consider repeat in future 3-5 years check vitamin D  hcv neg  Lipid had 08/12/17 reviewed CT chest due 04/2018 sch for pt 07/2018  Will need dermatology in future$ is issue currently    Provider: Dr. Olivia Mackie McLean-Scocuzza-Internal Medicine

## 2018-06-26 NOTE — Addendum Note (Signed)
Addended by: Quentin Ore on: 06/26/2018 07:43 PM   Modules accepted: Orders

## 2018-06-26 NOTE — Patient Instructions (Addendum)
Cranberry with D Mannose supplement daily  AZO pyridium     Urinary Tract Infection, Adult A urinary tract infection (UTI) is an infection of any part of the urinary tract, which includes the kidneys, ureters, bladder, and urethra. These organs make, store, and get rid of urine in the body. UTI can be a bladder infection (cystitis) or kidney infection (pyelonephritis). What are the causes? This infection may be caused by fungi, viruses, or bacteria. Bacteria are the most common cause of UTIs. This condition can also be caused by repeated incomplete emptying of the bladder during urination. What increases the risk? This condition is more likely to develop if:  You ignore your need to urinate or hold urine for long periods of time.  You do not empty your bladder completely during urination.  You wipe back to front after urinating or having a bowel movement, if you are female.  You are uncircumcised, if you are female.  You are constipated.  You have a urinary catheter that stays in place (indwelling).  You have a weak defense (immune) system.  You have a medical condition that affects your bowels, kidneys, or bladder.  You have diabetes.  You take antibiotic medicines frequently or for long periods of time, and the antibiotics no longer work well against certain types of infections (antibiotic resistance).  You take medicines that irritate your urinary tract.  You are exposed to chemicals that irritate your urinary tract.  You are female.  What are the signs or symptoms? Symptoms of this condition include:  Fever.  Frequent urination or passing small amounts of urine frequently.  Needing to urinate urgently.  Pain or burning with urination.  Urine that smells bad or unusual.  Cloudy urine.  Pain in the lower abdomen or back.  Trouble urinating.  Blood in the urine.  Vomiting or being less hungry than normal.  Diarrhea or abdominal pain.  Vaginal discharge,  if you are female.  How is this diagnosed? This condition is diagnosed with a medical history and physical exam. You will also need to provide a urine sample to test your urine. Other tests may be done, including:  Blood tests.  Sexually transmitted disease (STD) testing.  If you have had more than one UTI, a cystoscopy or imaging studies may be done to determine the cause of the infections. How is this treated? Treatment for this condition often includes a combination of two or more of the following:  Antibiotic medicine.  Other medicines to treat less common causes of UTI.  Over-the-counter medicines to treat pain.  Drinking enough water to stay hydrated.  Follow these instructions at home:  Take over-the-counter and prescription medicines only as told by your health care provider.  If you were prescribed an antibiotic, take it as told by your health care provider. Do not stop taking the antibiotic even if you start to feel better.  Avoid alcohol, caffeine, tea, and carbonated beverages. They can irritate your bladder.  Drink enough fluid to keep your urine clear or pale yellow.  Keep all follow-up visits as told by your health care provider. This is important.  Make sure to: ? Empty your bladder often and completely. Do not hold urine for long periods of time. ? Empty your bladder before and after sex. ? Wipe from front to back after a bowel movement if you are female. Use each tissue one time when you wipe. Contact a health care provider if:  You have back pain.  You have  a fever.  You feel nauseous or vomit.  Your symptoms do not get better after 3 days.  Your symptoms go away and then return. Get help right away if:  You have severe back pain or lower abdominal pain.  You are vomiting and cannot keep down any medicines or water. This information is not intended to replace advice given to you by your health care provider. Make sure you discuss any questions  you have with your health care provider. Document Released: 05/01/2005 Document Revised: 01/03/2016 Document Reviewed: 06/12/2015 Elsevier Interactive Patient Education  [employer].

## 2018-06-26 NOTE — Progress Notes (Signed)
Pre visit review using our clinic review tool, if applicable. No additional management support is needed unless otherwise documented below in the visit note. 

## 2018-06-28 LAB — URINE CULTURE
MICRO NUMBER:: 91411459
SPECIMEN QUALITY:: ADEQUATE

## 2018-06-28 LAB — URINALYSIS, ROUTINE W REFLEX MICROSCOPIC
Bilirubin Urine: NEGATIVE
Glucose, UA: NEGATIVE
Hyaline Cast: NONE SEEN /LPF
Nitrite: NEGATIVE
Specific Gravity, Urine: 1.03 (ref 1.001–1.03)
pH: <=5 (ref 5.0–8.0)

## 2018-06-30 ENCOUNTER — Encounter: Payer: Self-pay | Admitting: Emergency Medicine

## 2018-06-30 ENCOUNTER — Emergency Department: Payer: Medicare HMO

## 2018-06-30 ENCOUNTER — Emergency Department
Admission: EM | Admit: 2018-06-30 | Discharge: 2018-06-30 | Disposition: A | Discharge status: Home / Self Care | Payer: Medicare HMO | Attending: Emergency Medicine | Admitting: Emergency Medicine

## 2018-06-30 DIAGNOSIS — R42 Dizziness and giddiness: Secondary | ICD-10-CM | POA: Diagnosis not present

## 2018-06-30 DIAGNOSIS — Z96652 Presence of left artificial knee joint: Secondary | ICD-10-CM | POA: Insufficient documentation

## 2018-06-30 DIAGNOSIS — R404 Transient alteration of awareness: Secondary | ICD-10-CM | POA: Diagnosis not present

## 2018-06-30 DIAGNOSIS — Z7722 Contact with and (suspected) exposure to environmental tobacco smoke (acute) (chronic): Secondary | ICD-10-CM | POA: Insufficient documentation

## 2018-06-30 DIAGNOSIS — Z8673 Personal history of transient ischemic attack (TIA), and cerebral infarction without residual deficits: Secondary | ICD-10-CM | POA: Insufficient documentation

## 2018-06-30 DIAGNOSIS — E860 Dehydration: Secondary | ICD-10-CM | POA: Diagnosis not present

## 2018-06-30 DIAGNOSIS — Z7982 Long term (current) use of aspirin: Secondary | ICD-10-CM | POA: Insufficient documentation

## 2018-06-30 DIAGNOSIS — E039 Hypothyroidism, unspecified: Secondary | ICD-10-CM | POA: Insufficient documentation

## 2018-06-30 DIAGNOSIS — I2510 Atherosclerotic heart disease of native coronary artery without angina pectoris: Secondary | ICD-10-CM | POA: Insufficient documentation

## 2018-06-30 DIAGNOSIS — Z79899 Other long term (current) drug therapy: Secondary | ICD-10-CM | POA: Insufficient documentation

## 2018-06-30 DIAGNOSIS — R5383 Other fatigue: Secondary | ICD-10-CM | POA: Diagnosis not present

## 2018-06-30 DIAGNOSIS — N183 Chronic kidney disease, stage 3 (moderate): Secondary | ICD-10-CM | POA: Diagnosis not present

## 2018-06-30 DIAGNOSIS — I129 Hypertensive chronic kidney disease with stage 1 through stage 4 chronic kidney disease, or unspecified chronic kidney disease: Secondary | ICD-10-CM | POA: Diagnosis not present

## 2018-06-30 DIAGNOSIS — R4182 Altered mental status, unspecified: Secondary | ICD-10-CM | POA: Diagnosis not present

## 2018-06-30 LAB — URINALYSIS, COMPLETE (UACMP) WITH MICROSCOPIC
Bacteria, UA: NONE SEEN
Bilirubin Urine: NEGATIVE
Glucose, UA: NEGATIVE mg/dL
Hgb urine dipstick: NEGATIVE
Ketones, ur: NEGATIVE mg/dL
Nitrite: NEGATIVE
Protein, ur: NEGATIVE mg/dL
Specific Gravity, Urine: 1.009 (ref 1.005–1.030)
pH: 6 (ref 5.0–8.0)

## 2018-06-30 LAB — URINE DRUG SCREEN, QUALITATIVE (ARMC ONLY)
Amphetamines, Ur Screen: NOT DETECTED
Barbiturates, Ur Screen: NOT DETECTED
Benzodiazepine, Ur Scrn: NOT DETECTED
Cannabinoid 50 Ng, Ur ~~LOC~~: NOT DETECTED
Cocaine Metabolite,Ur ~~LOC~~: NOT DETECTED
MDMA (Ecstasy)Ur Screen: NOT DETECTED
Methadone Scn, Ur: NOT DETECTED
Opiate, Ur Screen: NOT DETECTED
Phencyclidine (PCP) Ur S: NOT DETECTED
Tricyclic, Ur Screen: NOT DETECTED

## 2018-06-30 LAB — CBC
HCT: 33.5 % — ABNORMAL LOW (ref 36.0–46.0)
Hemoglobin: 11.4 g/dL — ABNORMAL LOW (ref 12.0–15.0)
MCH: 31.5 pg (ref 26.0–34.0)
MCHC: 34 g/dL (ref 30.0–36.0)
MCV: 92.5 fL (ref 80.0–100.0)
Platelets: 160 10*3/uL (ref 150–400)
RBC: 3.62 MIL/uL — ABNORMAL LOW (ref 3.87–5.11)
RDW: 12.5 % (ref 11.5–15.5)
WBC: 4 10*3/uL (ref 4.0–10.5)
nRBC: 0 % (ref 0.0–0.2)

## 2018-06-30 LAB — BASIC METABOLIC PANEL
Anion gap: 8 (ref 5–15)
BUN: 19 mg/dL (ref 8–23)
CO2: 25 mmol/L (ref 22–32)
Calcium: 8.8 mg/dL — ABNORMAL LOW (ref 8.9–10.3)
Chloride: 104 mmol/L (ref 98–111)
Creatinine, Ser: 0.81 mg/dL (ref 0.44–1.00)
GFR calc Af Amer: >60 mL/min (ref >60)
GFR calc non Af Amer: >60 mL/min (ref >60)
Glucose, Bld: 126 mg/dL — ABNORMAL HIGH (ref 70–99)
Potassium: 4.6 mmol/L (ref 3.5–5.1)
Sodium: 137 mmol/L (ref 135–145)

## 2018-06-30 LAB — TROPONIN I: Troponin I: <0.03 ng/mL (ref <0.03)

## 2018-06-30 MED ORDER — SODIUM CHLORIDE 0.9 % IV BOLUS
1000.0000 mL | Freq: Once | INTRAVENOUS | Status: AC
  Administered 2018-06-30: 1000 mL via INTRAVENOUS

## 2018-06-30 NOTE — ED Provider Notes (Signed)
[facility] Emergency Department Provider Note ____________________________________________   I have reviewed the triage vital signs and the triage nursing note.  HISTORY  Chief Complaint drowsiness   Historian Patient  HPI Kimberly Cook is a 72 y.o. female presents from assisted living where apparently she was having altered mental status or sleepiness/drowsiness this morning.  Patient states that she is under a lot of stress recently, worried about financial issues.  She states she has not slept more than 3 or 4 hours for couple weeks at this point.  This morning she woke up with back spasm and took muscle relaxer which is fairly unusual for her.  Back is not hurting now.  Later on she was post to have an appointment at the [facility] about benefits and this was making her extremely anxious so she took an Ativan tablet and after that staff felt like she was drowsy.  Patient tells me that for the past 1 to 2 days she has been feeling dizzy which she describes as feeling off balance.  States that before 2 days ago she was in her usual state of health.  Denies nausea or vomiting although she states that she may have had decreased p.o. Intake.  No headache.  She states that yesterday she was actually having some spinning sensation.     Past Medical History:  Diagnosis Date  . Allergy   . Arthritis   . Coronary artery disease 04/2017   Mild to moderate CAD in LAD/diagonal by CTA (CT-FFR of apical LAD 0.79).  . Depression   . Diverticulosis   . Essential hypertension    Normal cardiolite 05/2006 EF 71%  . GERD (gastroesophageal reflux disease)   . Headache   . History of shingles   . Hyperlipidemia   . Hypothyroidism   . Mini stroke (HCC) 2011   . Occipital neuralgia   . Prediabetes   . Stroke (HCC)   . Stroke [facility])    MRI 04/2008 + left sup. frontal gyrus possibly puntate infarct   . Urinary tract infection   . Vitamin D deficiency     Patient  Active Problem List   Diagnosis Date Noted  . Chronic midline low back pain 04/22/2018  . Chronic pain of left knee 04/22/2018  . Toe fracture, left 03/10/2018  . Lightheadedness 01/21/2018  . PAC (premature atrial contraction) 01/19/2018  . PVC's (premature ventricular contractions) 01/19/2018  . Hernia, paraesophageal 12/16/2017  . Kidney cysts 12/16/2017  . CKD (chronic kidney disease) stage 3, GFR 30-59 ml/min (HCC) 12/16/2017  . Carotid artery stenosis 12/16/2017  . Chronic pain 12/16/2017  . Osteopenia 12/16/2017  . DDD (degenerative disc disease), cervical 12/16/2017  . Labile hypertension 09/05/2017  . Anxiety and depression 08/27/2017  . Insomnia 08/27/2017  . Syncope 08/27/2017  . Bilateral occipital neuralgia 08/27/2017  . Lung nodule 06/30/2017  . Coronary artery disease of native artery of native heart with stable angina pectoris (HCC) 06/03/2017  . Shortness of breath 04/11/2017  . Labile blood pressure 03/08/2017  . Syncope, near 03/08/2017  . Palpitations 03/08/2017  . Orthostatic lightheadedness 03/08/2017  . Essential hypertension   . S/P TKR (total knee replacement) 04/17/2015  . Morbid obesity (HCC) 09/21/2014  . Medication management 09/21/2014  . Hypothyroidism 02/17/2014  . Mixed hyperlipidemia   . GERD (gastroesophageal reflux disease)   . Vitamin D deficiency   . Depression     Past Surgical History:  Procedure Laterality Date  . ABDOMINAL HYSTERECTOMY    .  BLADDER SURGERY     2003  . BREAST BIOPSY Right Over 20 years    Benign  . CHOLECYSTECTOMY    . gastroplication     . JOINT REPLACEMENT Left   . KNEE ARTHROSCOPY Left 2011  . TONSILLECTOMY AND ADENOIDECTOMY    . TOTAL KNEE ARTHROPLASTY Left 04/17/2015   Procedure: LEFT TOTAL KNEE ARTHROPLASTY;  Surgeon: Durene Romans, MD;  Location: [facility]S;  Service: Orthopedics;  Laterality: Left;    Prior to Admission medications   Medication Sig Start Date End Date Taking? Authorizing Provider   aspirin EC 81 MG tablet Take 1 tablet (81 mg total) by mouth daily. 10/20/17  Yes End, Cristal Deer, MD  atorvastatin (LIPITOR) 20 MG tablet Take 1 tablet (20 mg total) by mouth daily. 03/10/18  Yes McLean-Scocuzza, Pasty Spillers, MD  Biotin 1 MG CAPS Take 1 mg by mouth daily.    Yes [provider]  Cholecalciferol (VITAMIN D3) 5000 units TABS Take 5,000 Units by mouth daily.   Yes [provider]  ciprofloxacin (CIPRO) 500 MG tablet Take 1 tablet (500 mg total) by mouth 2 (two) times daily. 06/26/18  Yes McLean-Scocuzza, Pasty Spillers, MD  diltiazem (CARDIZEM SR) 60 MG 12 hr capsule Take 1 capsule (60 mg total) by mouth 2 (two) times daily. 05/21/18  Yes McLean-Scocuzza, Pasty Spillers, MD  escitalopram (LEXAPRO) 5 MG tablet Take 5 mg by mouth daily.   Yes [provider]  folic acid (FOLVITE) 400 MCG tablet Take 400 mcg by mouth daily.   Yes [provider]  hydrALAZINE (APRESOLINE) 10 MG tablet Take 2 tablets (20 mg total) by mouth 2 (two) times daily as needed. BP>140/>90 06/26/18  Yes McLean-Scocuzza, Pasty Spillers, MD  hydrochlorothiazide (MICROZIDE) 12.5 MG capsule Take 1 capsule (12.5 mg total) by mouth daily. In am 01/07/18  Yes McLean-Scocuzza, Pasty Spillers, MD  levothyroxine (SYNTHROID, LEVOTHROID) 88 MCG tablet Take 1 tablet (88 mcg total) by mouth daily before breakfast. Skip sundays 05/21/18  Yes McLean-Scocuzza, Pasty Spillers, MD  LORazepam (ATIVAN) 0.5 MG tablet Take 1 tablet (0.5 mg total) by mouth 2 (two) times daily as needed for anxiety or sleep. Or 1 mg at night for sleep 03/10/18  Yes McLean-Scocuzza, Pasty Spillers, MD  omeprazole (PRILOSEC) 40 MG capsule Take 1 capsule (40 mg total) by mouth daily. 12/16/16  Yes Lucky Cowboy, MD  phenazopyridine (PYRIDIUM) 200 MG tablet Take 1 tablet (200 mg total) by mouth 3 (three) times daily as needed for pain. 06/26/18  Yes McLean-Scocuzza, Pasty Spillers, MD  tiZANidine (ZANAFLEX) 4 MG tablet Take 1 tablet (4 mg total) by mouth 2 (two) times daily as  needed for muscle spasms. 12/16/17  Yes McLean-Scocuzza, Pasty Spillers, MD  zolpidem (AMBIEN CR) 6.25 MG CR tablet Take 1 tablet (6.25 mg total) by mouth at bedtime as needed for sleep. 06/26/18  Yes McLean-Scocuzza, Pasty Spillers, MD    No Known Allergies  Family History  Problem Relation Age of Onset  . Heart disease Mother   . Hypertension Mother   . Diabetes Mother   . Heart attack Mother 62  . Heart disease Father   . Heart attack Father 23  . Breast cancer Maternal Aunt   . Heart attack Brother     Social History Social History   Tobacco Use  . Smoking status: Passive Smoke Exposure - Never Smoker  . Smokeless tobacco: Never Used  . Tobacco comment: husbands and children smoked in home.   Substance Use Topics  .  Alcohol use: No  . Drug use: No    Review of Systems  Constitutional: Negative for fever. Eyes: Negative for visual changes. ENT: Negative for sore throat. Cardiovascular: Negative for chest pain. Respiratory: Negative for shortness of breath. Gastrointestinal: Negative for abdominal pain, vomiting and diarrhea. Genitourinary: Negative for dysuria. Musculoskeletal: Negative for back pain. Skin: Negative for rash. Neurological: Negative for headache.  ____________________________________________   PHYSICAL EXAM:  VITAL SIGNS: ED Triage Vitals  Enc Vitals Group     BP 06/30/18 1111 98/71     Pulse Rate 06/30/18 1111 (!) 56     Resp 06/30/18 1111 14     Temp 06/30/18 1111 98.6 F (37 C)     Temp Source 06/30/18 1111 Oral     SpO2 06/30/18 1111 96 %     Weight 06/30/18 1113 165 lb (74.8 kg)     Height 06/30/18 1113 5\' 3"  (1.6 m)     Head Circumference --      Peak Flow --      Pain Score 06/30/18 1113 0     Pain Loc --      Pain Edu? --      Excl. in GC? --      Constitutional: Alert and oriented.  HEENT      Head: Normocephalic and atraumatic.      Eyes: Conjunctivae are normal. Pupils equal and round.       Ears:         Nose: No  congestion/rhinnorhea.      Mouth/Throat: Mucous membranes are moist.      Neck: No stridor. Cardiovascular/Chest: Normal rate, regular rhythm.  No murmurs, rubs, or gallops. Respiratory: Normal respiratory effort without tachypnea nor retractions. Breath sounds are clear and equal bilaterally. No wheezes/rales/rhonchi. Gastrointestinal: Soft. No distention, no guarding, no rebound. Nontender.    Genitourinary/rectal:Deferred Musculoskeletal: Nontender with normal range of motion in all extremities. No joint effusions.  No lower extremity tenderness.  No edema. Neurologic:  Normal speech and language. No gross or focal neurologic deficits are appreciated. Skin:  Skin is warm, dry and intact. No rash noted. Psychiatric: Mood and affect are normal. Speech and behavior are normal. Patient exhibits appropriate insight and judgment.   ____________________________________________  LABS (pertinent positives/negatives) I, Governor Rooks, MD the attending physician have reviewed the labs noted below.  Labs Reviewed  BASIC METABOLIC PANEL - Abnormal; Notable for the following components:      Result Value   Glucose, Bld 126 (*)    Calcium 8.8 (*)    All other components within normal limits  CBC - Abnormal; Notable for the following components:   RBC 3.62 (*)    Hemoglobin 11.4 (*)    HCT 33.5 (*)    All other components within normal limits  URINALYSIS, COMPLETE (UACMP) WITH MICROSCOPIC - Abnormal; Notable for the following components:   Color, Urine YELLOW (*)    APPearance CLEAR (*)    Leukocytes, UA SMALL (*)    All other components within normal limits  TROPONIN I  URINE DRUG SCREEN, QUALITATIVE ([facility] ONLY)    ____________________________________________    EKG I, Governor Rooks, MD, the attending physician have personally viewed and interpreted all ECGs.  45 bpm.  Sinus bradycardia.  Narrow QS per normal axis.  Nonspecific T  wave  ____________________________________________  RADIOLOGY   Ct head without contrast:  IMPRESSION: No acute abnormality  Progression of atrophy and chronic microvascular ischemic changes in the white matter since 2011. __________________________________________  PROCEDURES  Procedure(s) performed: None  Procedures  Critical Care performed: None   ____________________________________________  ED COURSE / ASSESSMENT AND PLAN  Pertinent labs & imaging results that were available during my care of the patient were reviewed by me and considered in my medical decision making (see chart for details).     Suspect most likely patient had side effect from sedative relaxer and Ativan, possibly in combination with feeling exhausted and stressed out, as well as some clinical mild dehydration.  She does not seem altered at this point in time.  She does describe dizziness for about 2 days with a sensation of being off balance.  There was one point where she described some vertiginous symptoms and spinning, so it is possible she has some component of peripheral vertigo.  She is a little slow to do finger-nose although she is able to complete it.  We discussed obtaining CT the head to rule out stroke.  CT the head is negative.  Discussed with her to obtain MRI to rule out small early stroke.  Awaiting MRI to rule out stroke as source of dizziness.off balance.  Patient care transferred to Dr. Derrill Kay at shift change 3:30 PM.  Awaiting disposition pending MRI report.    CONSULTATIONS:   None   Patient / Family / Caregiver informed of clinical course, medical decision-making process, and agree with plan.     ___________________________________________   FINAL CLINICAL IMPRESSION(S) / ED DIAGNOSES   Final diagnoses:  Dizziness  Dehydration  Vertigo      ___________________________________________         Note: This dictation was prepared with Dragon dictation.  Any transcriptional errors that result from this process are unintentional    Governor Rooks, MD 06/30/18 1530

## 2018-06-30 NOTE — ED Notes (Signed)
Patient transported to MRI 

## 2018-06-30 NOTE — ED Notes (Signed)
Patient ambulatory to restroom  ?

## 2018-06-30 NOTE — ED Notes (Signed)
Patient denies pain and is resting comfortably.  

## 2018-06-30 NOTE — ED Provider Notes (Signed)
MRI without concerning findings or explanation of patient's symptoms. In discussion with the patient there is some concern that muscle relaxers could have played a role. Daughter in law expressed concern that the patient is taking it daily and potentially could have accidentally taken an extra dose. Additionally patient states she is currently on antibiotics for a UTI. Did discuss importance of primary care follow up.    Phineas Semen, MD 06/30/18 [phone]

## 2018-06-30 NOTE — ED Triage Notes (Signed)
ARrives from [facility] via [facility] for C/O lethargy this morning.  EMS state that patient took Ativan and Ambien last night, which she does not usually take together.  Patient drowsy, but wakes easily to verbal stimuli.  MAE equally and strong.  AAOx3.  Patient states she woke up this morning, took shower and got dressed.  She states her back was hurting so she took a muscle relaxer.  Then about 1 hour - 1.5 hour later, patient took a "nerve pill" because she was going "somewhere important and was feeling nervous about it."  EMS reports patient has been under a lot of stress lately.

## 2018-06-30 NOTE — ED Notes (Signed)
Patient ambulated to restroom with standby assistance. Patient reports still feeling "funny" but able to walk without assistance. Repositioned in bed and hooked up to monitor.

## 2018-06-30 NOTE — Discharge Instructions (Addendum)
Please seek medical attention for any high fevers, chest pain, shortness of breath, change in behavior, persistent vomiting, bloody stool or any other new or concerning symptoms.  

## 2018-07-03 LAB — URINE CULTURE: Culture: 20000 — AB

## 2018-07-06 ENCOUNTER — Ambulatory Visit: Payer: Medicare HMO | Admitting: Family Medicine

## 2018-07-08 ENCOUNTER — Other Ambulatory Visit: Payer: Self-pay | Admitting: Internal Medicine

## 2018-07-08 DIAGNOSIS — M5030 Other cervical disc degeneration, unspecified cervical region: Secondary | ICD-10-CM

## 2018-07-08 MED ORDER — TIZANIDINE HCL 4 MG PO TABS: 4.0000 mg | ORAL_TABLET | Freq: Two times a day (BID) | ORAL | 5 refills | Status: DC | PRN

## 2018-07-10 ENCOUNTER — Ambulatory Visit (INDEPENDENT_AMBULATORY_CARE_PROVIDER_SITE_OTHER): Payer: Medicare HMO | Admitting: Internal Medicine

## 2018-07-10 ENCOUNTER — Ambulatory Visit: Payer: Medicare HMO

## 2018-07-10 ENCOUNTER — Encounter: Payer: Self-pay | Admitting: Internal Medicine

## 2018-07-10 VITALS — BP 117/72 | HR 76 | Temp 98.3°F | Ht 63.0 in | Wt 166.6 lb

## 2018-07-10 DIAGNOSIS — Z1231 Encounter for screening mammogram for malignant neoplasm of breast: Secondary | ICD-10-CM

## 2018-07-10 DIAGNOSIS — I10 Essential (primary) hypertension: Secondary | ICD-10-CM

## 2018-07-10 DIAGNOSIS — G4700 Insomnia, unspecified: Secondary | ICD-10-CM | POA: Diagnosis not present

## 2018-07-10 DIAGNOSIS — R42 Dizziness and giddiness: Secondary | ICD-10-CM | POA: Diagnosis not present

## 2018-07-10 DIAGNOSIS — N3000 Acute cystitis without hematuria: Secondary | ICD-10-CM | POA: Diagnosis not present

## 2018-07-10 MED ORDER — TRAZODONE HCL 50 MG PO TABS: 50.0000 mg | ORAL_TABLET | Freq: Every evening | ORAL | 3 refills | Status: DC | PRN

## 2018-07-10 NOTE — Progress Notes (Signed)
Chief Complaint  Patient presents with  . Follow-up   ED f/u 06/30/18  She went for reduced sleep, dizziness she took muscle relaxer, ativan at the same time but normally does not take muscle relaxer. BP in the ED was 98/71. CT head negative age related atrophy and MRI brain mild to moderate atrophy. Pt is not sure is she passed out and does not remember anything of what happened but took 3 hours to come to and get memory back. Her kids are c/w her being on Ambien 6.25 mg CR for sleep in the past she was on trazadone 150 mg qhs but does not remember if this helped  Review of Systems  Constitutional: Negative for weight loss.  HENT: Negative for hearing loss.   Eyes: Negative for blurred vision.  Respiratory: Negative for shortness of breath.   Cardiovascular: Negative for chest pain.  Gastrointestinal: Negative for abdominal pain.  Genitourinary: Negative for dysuria.  Skin: Negative for rash.  Neurological: Negative for dizziness and headaches.  Psychiatric/Behavioral: The patient is nervous/anxious.    Past Medical History:  Diagnosis Date  . Allergy   . Arthritis   . Coronary artery disease 04/2017   Mild to moderate CAD in LAD/diagonal by CTA (CT-FFR of apical LAD 0.79).  . Depression   . Diverticulosis   . Essential hypertension    Normal cardiolite 05/2006 EF 71%  . GERD (gastroesophageal reflux disease)   . Headache   . History of shingles   . Hyperlipidemia   . Hypothyroidism   . Mini stroke ([place]) 2011   . Occipital neuralgia   . Prediabetes   . Stroke ([place])   . Stroke [facility])    MRI 04/2008 + left sup. frontal gyrus possibly puntate infarct   . Urinary tract infection   . Vitamin D deficiency    Past Surgical History:  Procedure Laterality Date  . ABDOMINAL HYSTERECTOMY    . BLADDER SURGERY     2003  . BREAST BIOPSY Right Over 20 years    Benign  . CHOLECYSTECTOMY    . gastroplication     . JOINT REPLACEMENT Left   . KNEE ARTHROSCOPY Left 2011  .  TONSILLECTOMY AND ADENOIDECTOMY    . TOTAL KNEE ARTHROPLASTY Left 04/17/2015   Procedure: LEFT TOTAL KNEE ARTHROPLASTY;  Surgeon: Paralee Cancel, MD;  Location: [facility];  Service: Orthopedics;  Laterality: Left;   Family History  Problem Relation Age of Onset  . Heart disease Mother   . Hypertension Mother   . Diabetes Mother   . Heart attack Mother 7  . Heart disease Father   . Heart attack Father 35  . Breast cancer Maternal Aunt   . Heart attack Brother    Social History   Socioeconomic History  . Marital status: Widowed    Spouse name: Not on file  . Number of children: Not on file  . Years of education: Not on file  . Highest education level: Not on file  Occupational History  . Not on file  Social Needs  . Financial resource strain: Not on file  . Food insecurity:    Worry: Not on file    Inability: Not on file  . Transportation needs:    Medical: Not on file    Non-medical: Not on file  Tobacco Use  . Smoking status: Passive Smoke Exposure - Never Smoker  . Smokeless tobacco: Never Used  . Tobacco comment: husbands and children smoked in home.   Substance and Sexual  Activity  . Alcohol use: No  . Drug use: No  . Sexual activity: Never  Lifestyle  . Physical activity:    Days per week: Not on file    Minutes per session: Not on file  . Stress: Not on file  Relationships  . Social connections:    Talks on phone: Not on file    Gets together: Not on file    Attends religious service: Not on file    Active member of club or organization: Not on file    Attends meetings of clubs or organizations: Not on file    Relationship status: Not on file  . Intimate partner violence:    Fear of current or ex partner: Not on file    Emotionally abused: Not on file    Physically abused: Not on file    Forced sexual activity: Not on file  Other Topics Concern  . Not on file  Social History Narrative   Widowed    [place]    Has kids and grandson    Current  Meds  Medication Sig  . aspirin EC 81 MG tablet Take 1 tablet (81 mg total) by mouth daily.  Marland Kitchen atorvastatin (LIPITOR) 20 MG tablet Take 1 tablet (20 mg total) by mouth daily.  . Biotin 1 MG CAPS Take 1 mg by mouth daily.   . Cholecalciferol (VITAMIN D3) 5000 units TABS Take 5,000 Units by mouth daily.  . ciprofloxacin (CIPRO) 500 MG tablet Take 1 tablet (500 mg total) by mouth 2 (two) times daily.  Marland Kitchen diltiazem (CARDIZEM SR) 60 MG 12 hr capsule Take 1 capsule (60 mg total) by mouth 2 (two) times daily.  Marland Kitchen escitalopram (LEXAPRO) 5 MG tablet Take 5 mg by mouth daily.  . folic acid (FOLVITE) 786 MCG tablet Take 400 mcg by mouth daily.  . hydrALAZINE (APRESOLINE) 10 MG tablet Take 2 tablets (20 mg total) by mouth 2 (two) times daily as needed. BP>140/>90  . hydrochlorothiazide (MICROZIDE) 12.5 MG capsule Take 1 capsule (12.5 mg total) by mouth daily. In am  . levothyroxine (SYNTHROID, LEVOTHROID) 88 MCG tablet Take 1 tablet (88 mcg total) by mouth daily before breakfast. Skip sundays  . LORazepam (ATIVAN) 0.5 MG tablet Take 1 tablet (0.5 mg total) by mouth 2 (two) times daily as needed for anxiety or sleep. Or 1 mg at night for sleep  . omeprazole (PRILOSEC) 40 MG capsule Take 1 capsule (40 mg total) by mouth daily.  . phenazopyridine (PYRIDIUM) 200 MG tablet Take 1 tablet (200 mg total) by mouth 3 (three) times daily as needed for pain.  Marland Kitchen tiZANidine (ZANAFLEX) 4 MG tablet Take 1 tablet (4 mg total) by mouth 2 (two) times daily as needed for muscle spasms.  Marland Kitchen zolpidem (AMBIEN CR) 6.25 MG CR tablet Take 1 tablet (6.25 mg total) by mouth at bedtime as needed for sleep.   No Known Allergies Recent Results (from the past 2160 hour(s))  Lipase, blood     Status: None   Collection Time: 04/19/18 10:56 AM  Result Value Ref Range   Lipase 36 11 - 51 U/L    Comment: Performed at [facility], [address] [place], [place] [address]  Comprehensive metabolic panel     Status: Abnormal    Collection Time: 04/19/18 10:56 AM  Result Value Ref Range   Sodium 138 135 - 145 mmol/L   Potassium 3.6 3.5 - 5.1 mmol/L   Chloride 104 98 - 111 mmol/L  CO2 22 22 - 32 mmol/L   Glucose, Bld 125 (H) 70 - 99 mg/dL   BUN 20 8 - 23 mg/dL   Creatinine, Ser 0.89 0.44 - 1.00 mg/dL   Calcium 9.7 8.9 - 10.3 mg/dL   Total Protein 7.7 6.5 - 8.1 g/dL   Albumin 4.2 3.5 - 5.0 g/dL   AST 24 15 - 41 U/L   ALT 19 0 - 44 U/L   Alkaline Phosphatase 86 38 - 126 U/L   Total Bilirubin 1.0 0.3 - 1.2 mg/dL   GFR calc non Af Amer >60 >60 mL/min   GFR calc Af Amer >60 >60 mL/min    Comment: (NOTE) The eGFR has been calculated using the CKD EPI equation. This calculation has not been validated in all clinical situations. eGFR's persistently <60 mL/min signify possible Chronic Kidney Disease.    Anion gap 12 5 - 15    Comment: Performed at [facility], [address] [place], [place] [address]  CBC     Status: None   Collection Time: 04/19/18 10:56 AM  Result Value Ref Range   WBC 5.3 3.6 - 11.0 K/uL   RBC 4.43 3.80 - 5.20 MIL/uL   Hemoglobin 13.9 12.0 - 16.0 g/dL   HCT 39.6 35.0 - 47.0 %   MCV 89.5 80.0 - 100.0 fL   MCH 31.4 26.0 - 34.0 pg   MCHC 35.1 32.0 - 36.0 g/dL   RDW 12.9 11.5 - 14.5 %   Platelets 170 150 - 440 K/uL    Comment: Performed at [facility], [address] [place], [place] [address]  Urinalysis, Complete w Microscopic     Status: Abnormal   Collection Time: 04/19/18 10:56 AM  Result Value Ref Range   Color, Urine AMBER (A) YELLOW    Comment: BIOCHEMICALS MAY BE AFFECTED BY COLOR   APPearance CLOUDY (A) CLEAR   Specific Gravity, Urine 1.027 1.005 - 1.030   pH 5.0 5.0 - 8.0   Glucose, UA NEGATIVE NEGATIVE mg/dL   Hgb urine dipstick NEGATIVE NEGATIVE   Bilirubin Urine NEGATIVE NEGATIVE   Ketones, ur 5 (A) NEGATIVE mg/dL   Protein, ur 30 (A) NEGATIVE mg/dL   Nitrite POSITIVE (A) NEGATIVE   Leukocytes, UA MODERATE (A) NEGATIVE   RBC / HPF 6-10 0 - 5  RBC/hpf   WBC, UA >50 (H) 0 - 5 WBC/hpf   Bacteria, UA MANY (A) NONE SEEN   Squamous Epithelial / LPF 6-10 0 - 5   WBC Clumps PRESENT    Mucus PRESENT    Ca Oxalate Crys, UA PRESENT     Comment: Performed at [facility], [address], [place], [place] [address]  Urine Culture     Status: None   Collection Time: 04/20/18 11:27 AM  Result Value Ref Range   MICRO NUMBER: [id number]    SPECIMEN QUALITY: ADEQUATE    Sample Source URINE    STATUS: FINAL    Result: No Growth   Hemoglobin A1c     Status: None   Collection Time: 05/19/18  8:22 AM  Result Value Ref Range   Hgb A1c MFr Bld 5.0 4.6 - 6.5 %    Comment: Glycemic Control Guidelines for People with Diabetes:Non Diabetic:  <6%Goal of Therapy: <7%Additional Action Suggested:  >8%   Magnesium     Status: None   Collection Time: 05/19/18  8:22 AM  Result Value Ref Range   Magnesium 1.9 1.5 - 2.5 mg/dL  TSH  Status: Abnormal   Collection Time: 05/19/18  8:22 AM  Result Value Ref Range   TSH 0.02 (L) 0.35 - 4.50 uIU/mL  Vitamin D (25 hydroxy)     Status: None   Collection Time: 05/19/18  8:22 AM  Result Value Ref Range   VITD 70.87 30.00 - 100.00 ng/mL  B12     Status: None   Collection Time: 05/19/18  8:22 AM  Result Value Ref Range   Vitamin B-12 248 211 - 911 pg/mL  Urinalysis, Routine w reflex microscopic     Status: Abnormal   Collection Time: 06/26/18  4:43 PM  Result Value Ref Range   Color, Urine DARK YELLOW YELLOW   APPearance TURBID (A) CLEAR   Specific Gravity, Urine 1.030 1.001 - 1.03   pH < OR = 5.0 5.0 - 8.0   Glucose, UA NEGATIVE NEGATIVE   Bilirubin Urine NEGATIVE NEGATIVE   Ketones, ur TRACE (A) NEGATIVE   Hgb urine dipstick 1+ (A) NEGATIVE   Protein, ur 2+ (A) NEGATIVE   Nitrite NEGATIVE NEGATIVE   Leukocytes, UA 3+ (A) NEGATIVE   WBC, UA PACKED (A) 0 - 5 /HPF   RBC / HPF 3-10 (A) 0 - 2 /HPF   Squamous Epithelial / LPF 0-5 < OR = 5 /HPF   Bacteria, UA MANY (A) NONE SEEN /HPF   Hyaline  Cast NONE SEEN NONE SEEN /LPF  Urine Culture     Status: Abnormal   Collection Time: 06/26/18  4:43 PM  Result Value Ref Range   MICRO NUMBER: [id number]    SPECIMEN QUALITY: Adequate    Sample Source URINE    STATUS: FINAL    ISOLATE 1: Escherichia coli (A)     Comment: Greater than 100,000 CFU/mL of Escherichia coli      Susceptibility   Escherichia coli - URINE CULTURE, REFLEX    AMOX/CLAVULANIC <=2 Sensitive     AMPICILLIN 4 Sensitive     AMPICILLIN/SULBACTAM <=2 Sensitive     CEFAZOLIN* <=4 Not Reportable      * For infections other than uncomplicated UTIcaused by E. coli, K. pneumoniae or P. mirabilis:Cefazolin is resistant if MIC > or = 8 mcg/mL.(Distinguishing susceptible versus intermediatefor isolates with MIC < or = 4 mcg/mL requiresadditional testing.)For uncomplicated UTI caused by E. coli,K. pneumoniae or P. mirabilis: Cefazolin issusceptible if MIC <32 mcg/mL and predictssusceptible to the oral agents cefaclor, cefdinir,cefpodoxime, cefprozil, cefuroxime, cephalexinand loracarbef.    CEFEPIME <=1 Sensitive     CEFTRIAXONE <=1 Sensitive     CIPROFLOXACIN <=0.25 Sensitive     LEVOFLOXACIN <=0.12 Sensitive     ERTAPENEM <=0.5 Sensitive     GENTAMICIN <=1 Sensitive     IMIPENEM <=0.25 Sensitive     NITROFURANTOIN <=16 Sensitive     PIP/TAZO <=4 Sensitive     TOBRAMYCIN <=1 Sensitive     TRIMETH/SULFA* <=20 Sensitive      * For infections other than uncomplicated UTIcaused by E. coli, K. pneumoniae or P. mirabilis:Cefazolin is resistant if MIC > or = 8 mcg/mL.(Distinguishing susceptible versus intermediatefor isolates with MIC < or = 4 mcg/mL requiresadditional testing.)For uncomplicated UTI caused by E. coli,K. pneumoniae or P. mirabilis: Cefazolin issusceptible if MIC <32 mcg/mL and predictssusceptible to the oral agents cefaclor, cefdinir,cefpodoxime, cefprozil, cefuroxime, cephalexinand loracarbef.Legend:S = Susceptible  I = IntermediateR = Resistant  NS = Not susceptible* =  Not tested  NR = Not reported**NN = See antimicrobic comments  Basic metabolic panel     Status: Abnormal  Collection Time: 06/30/18 11:20 AM  Result Value Ref Range   Sodium 137 135 - 145 mmol/L   Potassium 4.6 3.5 - 5.1 mmol/L    Comment: HEMOLYSIS AT THIS LEVEL MAY AFFECT RESULT   Chloride 104 98 - 111 mmol/L   CO2 25 22 - 32 mmol/L   Glucose, Bld 126 (H) 70 - 99 mg/dL   BUN 19 8 - 23 mg/dL   Creatinine, Ser 0.81 0.44 - 1.00 mg/dL   Calcium 8.8 (L) 8.9 - 10.3 mg/dL   GFR calc non Af Amer >60 >60 mL/min   GFR calc Af Amer >60 >60 mL/min   Anion gap 8 5 - 15    Comment: Performed at [facility], [address] [place], [place] [address]  CBC     Status: Abnormal   Collection Time: 06/30/18 11:20 AM  Result Value Ref Range   WBC 4.0 4.0 - 10.5 K/uL   RBC 3.62 (L) 3.87 - 5.11 MIL/uL   Hemoglobin 11.4 (L) 12.0 - 15.0 g/dL   HCT 33.5 (L) 36.0 - 46.0 %   MCV 92.5 80.0 - 100.0 fL   MCH 31.5 26.0 - 34.0 pg   MCHC 34.0 30.0 - 36.0 g/dL   RDW 12.5 11.5 - 15.5 %   Platelets 160 150 - 400 K/uL   nRBC 0.0 0.0 - 0.2 %    Comment: Performed at [facility], [address] [place], [place] [address]  Troponin I - ONCE - STAT     Status: None   Collection Time: 06/30/18 11:20 AM  Result Value Ref Range   Troponin I <0.03 <0.03 ng/mL    Comment: Performed at [facility], [address] [place],  [address]  Urine Drug Screen, Qualitative     Status: None   Collection Time: 06/30/18 11:24 AM  Result Value Ref Range   Tricyclic, Ur Screen NONE DETECTED NONE DETECTED   Amphetamines, Ur Screen NONE DETECTED NONE DETECTED   MDMA (Ecstasy)Ur Screen NONE DETECTED NONE DETECTED   Cocaine Metabolite,Ur [place] NONE DETECTED NONE DETECTED   Opiate, Ur Screen NONE DETECTED NONE DETECTED   Phencyclidine (PCP) Ur S NONE DETECTED NONE DETECTED   Cannabinoid 50 Ng, Ur [place] NONE DETECTED NONE DETECTED   Barbiturates, Ur Screen NONE DETECTED NONE DETECTED    Benzodiazepine, Ur Scrn NONE DETECTED NONE DETECTED   Methadone Scn, Ur NONE DETECTED NONE DETECTED    Comment: (NOTE) Tricyclics + metabolites, urine    Cutoff 1000 ng/mL Amphetamines + metabolites, urine  Cutoff 1000 ng/mL MDMA (Ecstasy), urine              Cutoff 500 ng/mL Cocaine Metabolite, urine          Cutoff 300 ng/mL Opiate + metabolites, urine        Cutoff 300 ng/mL Phencyclidine (PCP), urine         Cutoff 25 ng/mL Cannabinoid, urine                 Cutoff 50 ng/mL Barbiturates + metabolites, urine  Cutoff 200 ng/mL Benzodiazepine, urine              Cutoff 200 ng/mL Methadone, urine                   Cutoff 300 ng/mL The urine drug screen provides only a preliminary, unconfirmed analytical test result and should not be used for non-medical purposes. Clinical consideration and professional judgment should be applied to  any positive drug screen result due to possible interfering substances. A more specific alternate chemical method must be used in order to obtain a confirmed analytical result. Gas chromatography / mass spectrometry (GC/MS) is the preferred confirmat ory method. Performed at [facility], [address] [place], [place] [address]   Urinalysis, Complete w Microscopic     Status: Abnormal   Collection Time: 06/30/18 11:24 AM  Result Value Ref Range   Color, Urine YELLOW (A) YELLOW   APPearance CLEAR (A) CLEAR   Specific Gravity, Urine 1.009 1.005 - 1.030   pH 6.0 5.0 - 8.0   Glucose, UA NEGATIVE NEGATIVE mg/dL   Hgb urine dipstick NEGATIVE NEGATIVE   Bilirubin Urine NEGATIVE NEGATIVE   Ketones, ur NEGATIVE NEGATIVE mg/dL   Protein, ur NEGATIVE NEGATIVE mg/dL   Nitrite NEGATIVE NEGATIVE   Leukocytes, UA SMALL (A) NEGATIVE   RBC / HPF 0-5 0 - 5 RBC/hpf   WBC, UA 11-20 0 - 5 WBC/hpf   Bacteria, UA NONE SEEN NONE SEEN   Squamous Epithelial / LPF 0-5 0 - 5   Mucus PRESENT     Comment: Performed at [facility], [address], [place], [place] [address]  Urine Culture     Status: Abnormal   Collection Time: 06/30/18  5:48 PM  Result Value Ref Range   Specimen Description      URINE, RANDOM Performed at [facility], [address], [place],  [address]    Special Requests      NONE Performed at [facility], [address] [place],  [address]    Culture (A)     20,000 COLONIES/mL ESCHERICHIA COLI Confirmed Extended Spectrum Beta-Lactamase Producer (ESBL).  In bloodstream infections from ESBL organisms, carbapenems are preferred over piperacillin/tazobactam. They are shown to have a lower risk of mortality.    Report Status 07/03/2018 FINAL    Organism ID, Bacteria ESCHERICHIA COLI (A)       Susceptibility   Escherichia coli - MIC*    AMPICILLIN >=32 RESISTANT Resistant     CEFAZOLIN >=64 RESISTANT Resistant     CEFTRIAXONE >=64 RESISTANT Resistant     CIPROFLOXACIN >=4 RESISTANT Resistant     GENTAMICIN <=1 SENSITIVE Sensitive     IMIPENEM <=0.25 SENSITIVE Sensitive     NITROFURANTOIN <=16 SENSITIVE Sensitive     TRIMETH/SULFA >=320 RESISTANT Resistant     AMPICILLIN/SULBACTAM 16 INTERMEDIATE Intermediate     PIP/TAZO <=4 SENSITIVE Sensitive     Extended ESBL POSITIVE Resistant     * 20,000 COLONIES/mL ESCHERICHIA COLI   Objective  Body mass index is 29.51 kg/m. Wt Readings from Last 3 Encounters:  07/10/18 166 lb 9.6 oz (75.6 kg)  06/30/18 165 lb (74.8 kg)  06/26/18 165 lb (74.8 kg)   Temp Readings from Last 3 Encounters:  07/10/18 98.3 F (36.8 C) (Oral)  06/30/18 98.6 F (37 C) (Oral)  06/26/18 98.6 F (37 C) (Oral)   BP Readings from Last 3 Encounters:  07/10/18 117/72  06/30/18 (!) 152/87  06/26/18 (!) 166/96   Pulse Readings from Last 3 Encounters:  07/10/18 76  06/30/18 62  06/26/18 85    Physical Exam  Constitutional: She is oriented to person, place, and time. Vital signs are normal. She appears well-developed and well-nourished. She is  cooperative.  HENT:  Head: Normocephalic and atraumatic.  Mouth/Throat: Oropharynx is clear and moist and mucous membranes are normal.  Eyes: Pupils are equal, round, and reactive to  light. Conjunctivae are normal.  Cardiovascular: Normal rate, regular rhythm and normal heart sounds.  Pulmonary/Chest: Effort normal and breath sounds normal.  Neurological: She is alert and oriented to person, place, and time. Gait normal.  Skin: Skin is warm, dry and intact.  Psychiatric: She has a normal mood and affect. Her speech is normal and behavior is normal. Judgment and thought content normal.  Nursing note and vitals reviewed.   Assessment   1. Dizziness resolved could be 2/2 meds, spikes in BP though BP controlled now d/c ambien cr 6.25  2. Insomnia  3. H/o UTI  4. HM Plan   1. Change ambien 6.25 cr to trazadone 50-100 mg qhs prn  2. trazadone as above  3. UA and culture today 4.  Flu shot utd Tdapconsider in future unable to find record pna23 utd prevnar had 06/16/15  shingrix 1/2 05/22/18  Consider hep B vaccine  A1C 5.0   colonoscopy had 06/03/11 Dr. Michail Sermon diverticulosis f/u in 10 years  mammo [facility] 04/08/17 negwill refer locally at f/u  Out of age window pap last 01/26/10 neg   dexa 04/13/15 osteopenia consider repeat in future3-5 years check vitamin D  hcv neg Lipid had 08/12/17 reviewed CT chest due 9/2019sch for pt 07/2018 Will need dermatology in future$ is issue currently  Provider: Dr. Olivia Mackie McLean-Scocuzza-Internal Medicine

## 2018-07-10 NOTE — Patient Instructions (Addendum)
Stop brownish/red Ambien pill with A and swiggly line  Consider cranberry with D mannose pill for UTI/bladder infection   Insomnia Insomnia is a sleep disorder that makes it difficult to fall asleep or to stay asleep. Insomnia can cause tiredness (fatigue), low energy, difficulty concentrating, mood swings, and poor performance at work or school. There are three different ways to classify insomnia:  Difficulty falling asleep.  Difficulty staying asleep.  Waking up too early in the morning.  Any type of insomnia can be long-term (chronic) or short-term (acute). Both are common. Short-term insomnia usually lasts for three months or less. Chronic insomnia occurs at least three times a week for longer than three months. What are the causes? Insomnia may be caused by another condition, situation, or substance, such as:  Anxiety.  Certain medicines.  Gastroesophageal reflux disease (GERD) or other gastrointestinal conditions.  Asthma or other breathing conditions.  Restless legs syndrome, sleep apnea, or other sleep disorders.  Chronic pain.  Menopause. This may include hot flashes.  Stroke.  Abuse of alcohol, tobacco, or illegal drugs.  Depression.  Caffeine.  Neurological disorders, such as Alzheimer disease.  An overactive thyroid (hyperthyroidism).  The cause of insomnia may not be known. What increases the risk? Risk factors for insomnia include:  Gender. Women are more commonly affected than men.  Age. Insomnia is more common as you get older.  Stress. This may involve your professional or personal life.  Income. Insomnia is more common in people with lower income.  Lack of exercise.  Irregular work schedule or night shifts.  Traveling between different time zones.  What are the signs or symptoms? If you have insomnia, trouble falling asleep or trouble staying asleep is the main symptom. This may lead to other symptoms, such as:  Feeling  fatigued.  Feeling nervous about going to sleep.  Not feeling rested in the morning.  Having trouble concentrating.  Feeling irritable, anxious, or depressed.  How is this treated? Treatment for insomnia depends on the cause. If your insomnia is caused by an underlying condition, treatment will focus on addressing the condition. Treatment may also include:  Medicines to help you sleep.  Counseling or therapy.  Lifestyle adjustments.  Follow these instructions at home:  Take medicines only as directed by your health care provider.  Keep regular sleeping and waking hours. Avoid naps.  Keep a sleep diary to help you and your health care provider figure out what could be causing your insomnia. Include: ? When you sleep. ? When you wake up during the night. ? How well you sleep. ? How rested you feel the next day. ? Any side effects of medicines you are taking. ? What you eat and drink.  Make your bedroom a comfortable place where it is easy to fall asleep: ? Put up shades or special blackout curtains to block light from outside. ? Use a white noise machine to block noise. ? Keep the temperature cool.  Exercise regularly as directed by your health care provider. Avoid exercising right before bedtime.  Use relaxation techniques to manage stress. Ask your health care provider to suggest some techniques that may work well for you. These may include: ? Breathing exercises. ? Routines to release muscle tension. ? Visualizing peaceful scenes.  Cut back on alcohol, caffeinated beverages, and cigarettes, especially close to bedtime. These can disrupt your sleep.  Do not overeat or eat spicy foods right before bedtime. This can lead to digestive discomfort that can make it hard  for you to sleep.  Limit screen use before bedtime. This includes: ? Watching TV. ? Using your smartphone, tablet, and computer.  Stick to a routine. This can help you fall asleep faster. Try to do a  quiet activity, brush your teeth, and go to bed at the same time each night.  Get out of bed if you are still awake after 15 minutes of trying to sleep. Keep the lights down, but try reading or doing a quiet activity. When you feel sleepy, go back to bed.  Make sure that you drive carefully. Avoid driving if you feel very sleepy.  Keep all follow-up appointments as directed by your health care provider. This is important. Contact a health care provider if:  You are tired throughout the day or have trouble in your daily routine due to sleepiness.  You continue to have sleep problems or your sleep problems get worse. Get help right away if:  You have serious thoughts about hurting yourself or someone else. This information is not intended to replace advice given to you by your health care provider. Make sure you discuss any questions you have with your health care provider. Document Released: 07/19/2000 Document Revised: 12/22/2015 Document Reviewed: 04/22/2014 Elsevier Interactive Patient Education  [employer].

## 2018-07-10 NOTE — Addendum Note (Signed)
Addended by: Penne Lash on: 07/10/2018 09:54 AM   Modules accepted: Orders

## 2018-07-10 NOTE — Progress Notes (Signed)
Pre visit review using our clinic review tool, if applicable. No additional management support is needed unless otherwise documented below in the visit note. 

## 2018-07-17 ENCOUNTER — Ambulatory Visit (INDEPENDENT_AMBULATORY_CARE_PROVIDER_SITE_OTHER): Payer: Medicare HMO

## 2018-07-17 VITALS — BP 136/78 | HR 77 | Temp 98.2°F | Resp 15 | Ht 63.0 in | Wt 167.1 lb

## 2018-07-17 DIAGNOSIS — Z0000 Encounter for general adult medical examination without abnormal findings: Secondary | ICD-10-CM

## 2018-07-17 NOTE — Progress Notes (Signed)
Agree with below   TMS 

## 2018-07-17 NOTE — Progress Notes (Signed)
Subjective:   Kimberly Cook is a 72 y.o. female who presents for Medicare Annual (Subsequent) preventive examination.  Review of Systems:  No ROS.  Medicare Wellness Visit. Additional risk factors are reflected in the social history. Cardiac Risk Factors include: advanced age (>67men, >55 women);hypertension     Objective:     Vitals: BP 136/78 (BP Location: Left Arm, Patient Position: Sitting, Cuff Size: Normal)   Pulse 77   Temp 98.2 F (36.8 C) (Oral)   Resp 15   Ht 5\' 3"  (1.6 m)   Wt 167 lb 1.9 oz (75.8 kg)   SpO2 97%   BMI 29.60 kg/m   Body mass index is 29.6 kg/m.  Advanced Directives 07/17/2018 06/30/2018 04/19/2018 04/02/2016 12/20/2015 12/13/2015 08/23/2015  Does Patient Have a Medical Advance Directive? Yes No Yes Yes Yes Yes No  Type of Estate agent of [place];Living will - Living will;Healthcare Power of [employer] Power of [place];Living will Healthcare Power of [place];Living will Healthcare Power of [place];Living will -  Does patient want to make changes to medical advance directive? No - Patient declined - No - Patient declined No - Patient declined No - Patient declined - -  Copy of Healthcare Power of Attorney in Chart? Yes - validated most recent copy scanned in chart (See row information) - No - copy requested Yes Yes No - copy requested -  Would patient like information on creating a medical advance directive? - No - Patient declined No - Patient declined - - - No - patient declined information    Tobacco Social History   Tobacco Use  Smoking Status Passive Smoke Exposure - Never Smoker  Smokeless Tobacco Never Used  Tobacco Comment   husbands and children smoked in home.      Counseling given: Not Answered Comment: husbands and children smoked in home.    Clinical Intake:  Pre-visit preparation completed: Yes        Diabetes: No  How often do you need to have someone help you when you read  instructions, pamphlets, or other written materials from your doctor or pharmacy?: 1 - Never  Interpreter Needed?: No     Past Medical History:  Diagnosis Date  . Allergy   . Arthritis   . Coronary artery disease 04/2017   Mild to moderate CAD in LAD/diagonal by CTA (CT-FFR of apical LAD 0.79).  . Depression   . Diverticulosis   . Essential hypertension    Normal cardiolite 05/2006 EF 71%  . GERD (gastroesophageal reflux disease)   . Headache   . History of shingles   . Hyperlipidemia   . Hypothyroidism   . Mini stroke (HCC) 2011   . Occipital neuralgia   . Prediabetes   . Stroke (HCC)   . Stroke [facility])    MRI 04/2008 + left sup. frontal gyrus possibly puntate infarct   . Urinary tract infection   . Vitamin D deficiency    Past Surgical History:  Procedure Laterality Date  . ABDOMINAL HYSTERECTOMY    . BLADDER SURGERY     2003  . BREAST BIOPSY Right Over 20 years    Benign  . CHOLECYSTECTOMY    . gastroplication     . JOINT REPLACEMENT Left   . KNEE ARTHROSCOPY Left 2011  . TONSILLECTOMY AND ADENOIDECTOMY    . TOTAL KNEE ARTHROPLASTY Left 04/17/2015   Procedure: LEFT TOTAL KNEE ARTHROPLASTY;  Surgeon: Durene Romans, MD;  Location: [facility];  Service: Orthopedics;  Laterality:  Left;   Family History  Problem Relation Age of Onset  . Heart disease Mother   . Hypertension Mother   . Diabetes Mother   . Heart attack Mother 76  . Heart disease Father   . Heart attack Father 61  . Breast cancer Maternal Aunt   . Heart attack Brother    Social History   Socioeconomic History  . Marital status: Widowed    Spouse name: Not on file  . Number of children: Not on file  . Years of education: Not on file  . Highest education level: Not on file  Occupational History  . Not on file  Social Needs  . Financial resource strain: Not hard at all  . Food insecurity:    Worry: Never true    Inability: Never true  . Transportation needs:    Medical: No    Non-medical: No    Tobacco Use  . Smoking status: Passive Smoke Exposure - Never Smoker  . Smokeless tobacco: Never Used  . Tobacco comment: husbands and children smoked in home.   Substance and Sexual Activity  . Alcohol use: No  . Drug use: No  . Sexual activity: Never  Lifestyle  . Physical activity:    Days per week: 7 days    Minutes per session: 30 min  . Stress: Not on file  Relationships  . Social connections:    Talks on phone: Not on file    Gets together: Not on file    Attends religious service: Not on file    Active member of club or organization: Not on file    Attends meetings of clubs or organizations: Not on file    Relationship status: Not on file  Other Topics Concern  . Not on file  Social History Narrative   Widowed    [place]    Has kids and grandson     Outpatient Encounter Medications as of 07/17/2018  Medication Sig  . aspirin EC 81 MG tablet Take 1 tablet (81 mg total) by mouth daily.  Marland Kitchen atorvastatin (LIPITOR) 20 MG tablet Take 1 tablet (20 mg total) by mouth daily.  . Biotin 1 MG CAPS Take 1 mg by mouth daily.   . Cholecalciferol (VITAMIN D3) 5000 units TABS Take 5,000 Units by mouth daily.  . ciprofloxacin (CIPRO) 500 MG tablet Take 1 tablet (500 mg total) by mouth 2 (two) times daily.  Marland Kitchen diltiazem (CARDIZEM SR) 60 MG 12 hr capsule Take 1 capsule (60 mg total) by mouth 2 (two) times daily.  Marland Kitchen escitalopram (LEXAPRO) 5 MG tablet Take 5 mg by mouth daily.  . folic acid (FOLVITE) 400 MCG tablet Take 400 mcg by mouth daily.  . hydrALAZINE (APRESOLINE) 10 MG tablet Take 2 tablets (20 mg total) by mouth 2 (two) times daily as needed. BP>140/>90  . hydrochlorothiazide (MICROZIDE) 12.5 MG capsule Take 1 capsule (12.5 mg total) by mouth daily. In am  . levothyroxine (SYNTHROID, LEVOTHROID) 88 MCG tablet Take 1 tablet (88 mcg total) by mouth daily before breakfast. Skip sundays  . LORazepam (ATIVAN) 0.5 MG tablet Take 1 tablet (0.5 mg total) by mouth 2 (two) times  daily as needed for anxiety or sleep. Or 1 mg at night for sleep  . omeprazole (PRILOSEC) 40 MG capsule Take 1 capsule (40 mg total) by mouth daily.  Marland Kitchen tiZANidine (ZANAFLEX) 4 MG tablet Take 1 tablet (4 mg total) by mouth 2 (two) times daily as needed for muscle spasms.  Marland Kitchen  traZODone (DESYREL) 50 MG tablet Take 1-2 tablets (50-100 mg total) by mouth at bedtime as needed for sleep. 1 hr before bed   No facility-administered encounter medications on file as of 07/17/2018.     Activities of Daily Living In your present state of health, do you have any difficulty performing the following activities: 07/17/2018  Hearing? N  Vision? N  Difficulty concentrating or making decisions? Y  Comment Notes difficulty remembering at times.   Walking or climbing stairs? Y  Comment Unsteady gait.  Walker in use when ambulating.   Dressing or bathing? N  Doing errands, shopping? Y  Comment She does not have a car to Engineer, manufacturing and eating ? Y  Comment Facility to prep meals.  She does not cook. Self feeds.   Using the Toilet? N  In the past six months, have you accidently leaked urine? Y  Comment Managed with daily brief  Do you have problems with loss of bowel control? Y  Comment Managed with daily brief  Managing your Medications? Y  Comment Facility assists  Managing your Finances? N  Housekeeping or managing your Housekeeping? Y  Comment Facility assists  Some recent data might be hidden    Patient Care Team: McLean-Scocuzza, Pasty Spillers, MD as PCP - General (Internal Medicine) End, Cristal Deer, MD as PCP - Cardiology (Cardiology) Charlott Rakes, MD as Consulting Physician (Gastroenterology) Salvatore Marvel, MD as Consulting Physician (Orthopedic Surgery)    Assessment:   This is a routine wellness examination for Kimberly Cook.    Patient notes she has not been able to bring urine specimen to in a timely manner, as directed. Tries today while in office and cannot produce. Clean cup  provided. Agrees to return as previously directed by doctor.   Health Screenings  Mammogram-04/08/17 Colonoscopy-06/03/11 Bone Density-04/13/15 Glaucoma-none reported Hearing-passes the whisper test Hemoglobin A1C-05/19/18 Cholesterol-08/22/17  Social  Alcohol intake- no Smoking history- no Smokers in home?none Illicit drug use?no Exercise walking Diet? regular Sexually Active-no Multiple Partners-no  Safety  Patient feesl safe at home.  Patient does have smoke detectors at home  Patient does wear sunscreen or protective clothing when in direct sunlight  Patient does wear seat belt when riding with others  States she loves independent living facility and is adjusting very well.     Activities of Daily Living Patient has assistance with household chores. Denies needing assistance with: feeding themselves, getting from bed to chair, getting to the toilet, bathing/showering, dressing, managing money. Facility assists with all ADLs as needed.   Depression Screen Patient denies losing interest in daily life, feeling hopeless, or crying easily over simple problems. She is a little discouraged while awaiting financial benefit from [facility], but over all doing ok.  Declines further intervention at this time.   Fall Screen Patient denies being afraid of falling in the last year.  Falls frequently due to leg weakness or getting tangled/pulled when walking her dog.  Walker in use when ambulating at all times. No falls since last reported to her doctor. Plans to be more aware when walking her dog and walk shorter distances.   Memory Screen Patient denies problems with misplacing items, and is able to balance checkbook/bank accounts.  Patient is alert, normal appearance, oriented to person/place/and time. Correctly identified the president of the [place], recall of 3 objects, and performing simple calculations.  Patient displays appropriate judgement and can read correct time from watch  face.   Notes difficulty remembering when she had  her most recent hospitalization and has discussed this with her doctor. Some memory delay. Brain engagement activity encouraged.   Immunizations The following Immunizations are up to date: Influenza, shingles, pneumonia, and tetanus.   Other Providers Patient Care Team: McLean-Scocuzza, Pasty Spillers, MD as PCP - General (Internal Medicine) End, Cristal Deer, MD as PCP - Cardiology (Cardiology) Charlott Rakes, MD as Consulting Physician (Gastroenterology) Salvatore Marvel, MD as Consulting Physician (Orthopedic Surgery)  Exercise Activities and Dietary recommendations Current Exercise Habits: Home exercise routine, Type of exercise: walking, Time (Minutes): 30, Frequency (Times/Week): 7, Weekly Exercise (Minutes/Week): 210, Intensity: Mild  Goals      Patient Stated   . Blood Pressure < 140/90 (pt-stated)     Monitor    . DIET - INCREASE WATER INTAKE (pt-stated)     Stay hydrated       Fall Risk Fall Risk  07/17/2018 06/26/2018 03/10/2018 12/16/2017 08/26/2017  Falls in the past year? 0 1 No Yes Yes  Comment No falls since last report - - - -  Number falls in past yr: - 1 - 2 or more 2 or more  Comment - - - - -  Injury with Fall? - 0 - Yes No  Risk Factor Category  - - - - -  Risk for fall due to : - History of fall(s) - - -  Risk for fall due to: Comment - - - - -  Follow up - - - - -   Depression Screen PHQ 2/9 Scores 07/17/2018 03/10/2018 12/16/2017 01/07/2017  PHQ - 2 Score 1 0 0 0  PHQ- 9 Score - - - -     Cognitive Function     6CIT Screen 07/17/2018  What Year? 0 points  What month? 0 points  What time? 0 points  Count back from 20 0 points  Months in reverse 0 points  Repeat phrase 0 points  Total Score 0    Immunization History  Administered Date(s) Administered  . DT 09/26/2015  . Influenza, High Dose Seasonal PF 06/16/2015, 04/02/2016, 05/06/2017, 05/04/2018  . Influenza-Unspecified 04/18/2013, 04/05/2014    . PPD Test 04/19/2015  . Pneumococcal Conjugate-13 06/16/2015  . Pneumococcal Polysaccharide-23 01/19/2018  . Pneumococcal-Unspecified 08/18/2010  . Td 08/18/2005  . Zoster 08/18/2005  . Zoster Recombinat (Shingrix) 05/22/2018   Screening Tests Health Maintenance  Topic Date Due  . MAMMOGRAM  04/09/2019  . COLONOSCOPY  06/02/2021  . TETANUS/TDAP  09/25/2025  . INFLUENZA VACCINE  Completed  . DEXA SCAN  Completed  . Hepatitis C Screening  Completed  . PNA vac Low Risk Adult  Completed  . FOOT EXAM  Discontinued  . HEMOGLOBIN A1C  Discontinued  . OPHTHALMOLOGY EXAM  Discontinued      Plan:    End of life planning; Advance aging; Advanced directives discussed. Copy of current HCPOA/Living Will on file.  I have personally reviewed and noted the following in the patient's chart:   . Medical and social history . Use of alcohol, tobacco or illicit drugs  . Current medications and supplements . Functional ability and status . Nutritional status . Physical activity . Advanced directives . List of other physicians . Hospitalizations, surgeries, and ER visits in previous 12 months . Vitals . Screenings to include cognitive, depression, and falls . Referrals and appointments  In addition, I have reviewed and discussed with patient certain preventive protocols, quality metrics, and best practice recommendations. A written personalized care plan for preventive services as well as general preventive health  recommendations were provided to patient.     Ashok Pall, LPN  16/05/9603

## 2018-07-17 NOTE — Patient Instructions (Addendum)
  Ms. Folger , Thank you for taking time to come for your Medicare Wellness Visit. I appreciate your ongoing commitment to your health goals. Please review the following plan we discussed and let me know if I can assist you in the future.   These are the goals we discussed: Goals      Patient Stated   . Blood Pressure < 140/90 (pt-stated)     Monitor    . DIET - INCREASE WATER INTAKE (pt-stated)     Stay hydrated       This is a list of the screening recommended for you and due dates:  Health Maintenance  Topic Date Due  . Mammogram  04/09/2019  . Colon Cancer Screening  06/02/2021  . Tetanus Vaccine  09/25/2025  . Flu Shot  Completed  . DEXA scan (bone density measurement)  Completed  .  Hepatitis C: One time screening is recommended by Center for Disease Control  (CDC) for  adults born from [birth year] through 1966.   Completed  . Pneumonia vaccines  Completed  . Complete foot exam   Discontinued  . Hemoglobin A1C  Discontinued  . Eye exam for diabetics  Discontinued

## 2018-08-06 DIAGNOSIS — J40 Bronchitis, not specified as acute or chronic: Secondary | ICD-10-CM | POA: Diagnosis not present

## 2018-08-06 DIAGNOSIS — R05 Cough: Secondary | ICD-10-CM | POA: Diagnosis not present

## 2018-08-06 DIAGNOSIS — J0100 Acute maxillary sinusitis, unspecified: Secondary | ICD-10-CM | POA: Diagnosis not present

## 2018-08-06 DIAGNOSIS — R399 Unspecified symptoms and signs involving the genitourinary system: Secondary | ICD-10-CM | POA: Diagnosis not present

## 2018-08-06 DIAGNOSIS — N390 Urinary tract infection, site not specified: Secondary | ICD-10-CM | POA: Diagnosis not present

## 2018-08-14 ENCOUNTER — Other Ambulatory Visit: Payer: Self-pay | Admitting: Internal Medicine

## 2018-08-14 MED ORDER — ESCITALOPRAM OXALATE 5 MG PO TABS: 5.0000 mg | ORAL_TABLET | Freq: Every day | ORAL | 1 refills | Status: DC

## 2018-08-14 NOTE — Telephone Encounter (Signed)
Copied from CRM [id number]. Topic: Quick Communication - Rx Refill/Question >> Aug 14, 2018 11:43 AM Arlyss Gandy, NT wrote: Medication: escitalopram (LEXAPRO) 5 MG tablet 90 tablets   Has the patient contacted their pharmacy? Yes.   (Agent: If no, request that the patient contact the pharmacy for the refill.) (Agent: If yes, when and what did the pharmacy advise?)  Preferred Pharmacy (with phone number or street name): [facility] - [place], [place] - [address] [phone] (Phone) [phone] (Fax)    Agent: Please be advised that RX refills may take up to 3 business days. We ask that you follow-up with your pharmacy.

## 2018-08-24 ENCOUNTER — Telehealth: Payer: Self-pay | Admitting: Internal Medicine

## 2018-08-24 DIAGNOSIS — E039 Hypothyroidism, unspecified: Secondary | ICD-10-CM

## 2018-08-24 DIAGNOSIS — F419 Anxiety disorder, unspecified: Secondary | ICD-10-CM

## 2018-08-24 DIAGNOSIS — G4700 Insomnia, unspecified: Secondary | ICD-10-CM

## 2018-08-24 NOTE — Telephone Encounter (Signed)
Copied from CRM [id number]. Topic: Quick Communication - Rx Refill/Question >> Aug 24, 2018 12:32 PM Fanny Bien wrote: Medication: LORazepam (ATIVAN) 0.5 MG tablet [[id number]] zolpidem (AMBIEN CR) 6.25 MG CR tablet [[id number]] levothyroxine (SYNTHROID, LEVOTHROID) 88 MCG tablet [[id number]]   Has the patient contacted their pharmacy?yes  Preferred Pharmacy (with phone number or street name):[facility] - [place], [place] - [address] [phone] (Phone) [phone] (Fax)   Agent: Please be advised that RX refills may take up to 3 business days. We ask that you follow-up with your pharmacy.

## 2018-08-24 NOTE — Telephone Encounter (Signed)
Requested medication (s) are due for refill today:  yes  Requested medication (s) are on the active medication list:  yes  Future visit scheduled:  yes  Last Refill: Lorazepam 03/10/18; #180; RF x 1                   Levothyroxine 05/21/18 # 90; no refills Note that pt. did not follow through with 6 week TSH recheck.    Requested Prescriptions  Pending Prescriptions Disp Refills   LORazepam (ATIVAN) 0.5 MG tablet 180 tablet 1    Sig: Take 1 tablet (0.5 mg total) by mouth 2 (two) times daily as needed for anxiety or sleep. Or 1 mg at night for sleep     Not Delegated - Psychiatry:  Anxiolytics/Hypnotics Failed - 08/24/2018 12:41 PM      Failed - This refill cannot be delegated      Passed - Urine Drug Screen completed in last 360 days.      Passed - Valid encounter within last 6 months    Recent Outpatient Visits          1 month ago Dizziness   [facility] [place] McLean-Scocuzza, Pasty Spillers, MD   1 month ago Acute cystitis without hematuria   [facility] [place] McLean-Scocuzza, Pasty Spillers, MD   4 months ago Acute cystitis without hematuria   [facility] [place] McLean-Scocuzza, Pasty Spillers, MD   5 months ago Anxiety and depression   [facility] [place] McLean-Scocuzza, Pasty Spillers, MD   6 months ago Essential hypertension   [facility] [place] McLean-Scocuzza, Pasty Spillers, MD      Future Appointments            In 1 month McLean-Scocuzza, Pasty Spillers, MD [facility] [place]   In 11 months O'Brien-Blaney, Denisa L, LPN [facility] [place]   In 11 months McLean-Scocuzza, Pasty Spillers, MD [facility] [place]          levothyroxine (SYNTHROID, LEVOTHROID) 88 MCG tablet 90 tablet 0    Sig: Take 1 tablet (88 mcg total) by mouth daily before breakfast. Skip sundays     Endocrinology:  Hypothyroid Agents Failed - 08/24/2018 12:41 PM      Failed - TSH needs to be rechecked within 3 months after  an abnormal result. Refill until TSH is due.      Failed - TSH in normal range and within 360 days    TSH  Date Value Ref Range Status  05/19/2018 0.02 (L) 0.35 - 4.50 uIU/mL Final         Passed - Valid encounter within last 12 months    Recent Outpatient Visits          1 month ago Dizziness   [facility] [place] McLean-Scocuzza, Pasty Spillers, MD   1 month ago Acute cystitis without hematuria   [facility] [place] McLean-Scocuzza, Pasty Spillers, MD   4 months ago Acute cystitis without hematuria   [facility] [place] McLean-Scocuzza, Pasty Spillers, MD   5 months ago Anxiety and depression   [facility] [place] McLean-Scocuzza, Pasty Spillers, MD   6 months ago Essential hypertension   [facility]  McLean-Scocuzza, Pasty Spillers, MD      Future Appointments            In 1 month McLean-Scocuzza, Pasty Spillers, MD [facility]   In 11 months O'Brien-Blaney, Denisa L,  LPN [facility] [place]   In 11 months McLean-Scocuzza, Pasty Spillersracy N, MD [facility] [place] [place]

## 2018-08-26 ENCOUNTER — Other Ambulatory Visit: Payer: Self-pay | Admitting: Internal Medicine

## 2018-08-26 DIAGNOSIS — E039 Hypothyroidism, unspecified: Secondary | ICD-10-CM

## 2018-08-26 DIAGNOSIS — G4700 Insomnia, unspecified: Secondary | ICD-10-CM

## 2018-08-26 DIAGNOSIS — F419 Anxiety disorder, unspecified: Secondary | ICD-10-CM

## 2018-08-26 MED ORDER — LEVOTHYROXINE SODIUM 88 MCG PO TABS: 88.0000 ug | ORAL_TABLET | Freq: Every day | ORAL | 3 refills | Status: DC

## 2018-08-26 NOTE — Telephone Encounter (Signed)
Call [employer]  -does pt have any ativan mail order from them?   Sent thyroid medication   Ambien we stopped and put her on trazadone for sleep [facility]  Inform pt   TMS

## 2018-08-31 ENCOUNTER — Other Ambulatory Visit: Payer: Self-pay | Admitting: Internal Medicine

## 2018-08-31 DIAGNOSIS — F419 Anxiety disorder, unspecified: Secondary | ICD-10-CM

## 2018-08-31 DIAGNOSIS — G4700 Insomnia, unspecified: Secondary | ICD-10-CM

## 2018-08-31 MED ORDER — LORAZEPAM 0.5 MG PO TABS: 0.5000 mg | ORAL_TABLET | Freq: Two times a day (BID) | ORAL | 5 refills | Status: DC | PRN

## 2018-08-31 NOTE — Telephone Encounter (Signed)
Patient was informed.  Patient understood and no questions, comments, or concerns at this time. Patient has been approved for [place].

## 2018-08-31 NOTE — Telephone Encounter (Signed)
Mal Amabile inform pt   We stopped ambien due her confusion she should be on trazadone for sleep. She should have enough refills of this for now  Sent thyroid medication  Sending ativan since no longer getting from [facility]   TMS

## 2018-08-31 NOTE — Telephone Encounter (Signed)
Pt called to check on these.  States they were supposed to be sent to [facility] because they are doing pill packs for her.  Pt is no longer using [employer].

## 2018-10-08 ENCOUNTER — Telehealth: Payer: Self-pay | Admitting: Internal Medicine

## 2018-10-08 DIAGNOSIS — G4700 Insomnia, unspecified: Secondary | ICD-10-CM

## 2018-10-08 MED ORDER — TRAZODONE HCL 50 MG PO TABS: 50.0000 mg | ORAL_TABLET | Freq: Every evening | ORAL | 3 refills | Status: DC | PRN

## 2018-10-08 NOTE — Telephone Encounter (Signed)
Kimberly Cook was stopped for sleep due to confusion and sent trazadone 50-100 at night for sleep  [facility]  Inform pt    tMS

## 2018-10-09 ENCOUNTER — Ambulatory Visit (INDEPENDENT_AMBULATORY_CARE_PROVIDER_SITE_OTHER): Payer: Medicare HMO | Admitting: Internal Medicine

## 2018-10-09 ENCOUNTER — Telehealth: Payer: Self-pay | Admitting: Internal Medicine

## 2018-10-09 ENCOUNTER — Encounter: Payer: Self-pay | Admitting: Internal Medicine

## 2018-10-09 VITALS — BP 138/88 | HR 89 | Temp 98.7°F | Ht 63.0 in | Wt 175.6 lb

## 2018-10-09 DIAGNOSIS — E782 Mixed hyperlipidemia: Secondary | ICD-10-CM

## 2018-10-09 DIAGNOSIS — N3281 Overactive bladder: Secondary | ICD-10-CM

## 2018-10-09 DIAGNOSIS — I10 Essential (primary) hypertension: Secondary | ICD-10-CM | POA: Diagnosis not present

## 2018-10-09 DIAGNOSIS — R300 Dysuria: Secondary | ICD-10-CM

## 2018-10-09 DIAGNOSIS — F419 Anxiety disorder, unspecified: Secondary | ICD-10-CM | POA: Diagnosis not present

## 2018-10-09 DIAGNOSIS — E538 Deficiency of other specified B group vitamins: Secondary | ICD-10-CM

## 2018-10-09 DIAGNOSIS — E039 Hypothyroidism, unspecified: Secondary | ICD-10-CM

## 2018-10-09 DIAGNOSIS — G4700 Insomnia, unspecified: Secondary | ICD-10-CM

## 2018-10-09 DIAGNOSIS — Z1231 Encounter for screening mammogram for malignant neoplasm of breast: Secondary | ICD-10-CM

## 2018-10-09 DIAGNOSIS — E559 Vitamin D deficiency, unspecified: Secondary | ICD-10-CM

## 2018-10-09 DIAGNOSIS — R002 Palpitations: Secondary | ICD-10-CM

## 2018-10-09 DIAGNOSIS — T148XXA Other injury of unspecified body region, initial encounter: Secondary | ICD-10-CM

## 2018-10-09 DIAGNOSIS — B3731 Acute candidiasis of vulva and vagina: Secondary | ICD-10-CM

## 2018-10-09 DIAGNOSIS — M1990 Unspecified osteoarthritis, unspecified site: Secondary | ICD-10-CM

## 2018-10-09 DIAGNOSIS — F329 Major depressive disorder, single episode, unspecified: Secondary | ICD-10-CM

## 2018-10-09 DIAGNOSIS — E2839 Other primary ovarian failure: Secondary | ICD-10-CM

## 2018-10-09 DIAGNOSIS — F32A Depression, unspecified: Secondary | ICD-10-CM

## 2018-10-09 DIAGNOSIS — K219 Gastro-esophageal reflux disease without esophagitis: Secondary | ICD-10-CM

## 2018-10-09 DIAGNOSIS — B373 Candidiasis of vulva and vagina: Secondary | ICD-10-CM

## 2018-10-09 LAB — COMPREHENSIVE METABOLIC PANEL
ALT: 16 U/L (ref 0–35)
AST: 16 U/L (ref 0–37)
Albumin: 4.4 g/dL (ref 3.5–5.2)
Alkaline Phosphatase: 101 U/L (ref 39–117)
BUN: 20 mg/dL (ref 6–23)
CO2: 28 mEq/L (ref 19–32)
Calcium: 9.6 mg/dL (ref 8.4–10.5)
Chloride: 102 mEq/L (ref 96–112)
Creatinine, Ser: 0.96 mg/dL (ref 0.40–1.20)
GFR: 56.82 mL/min — ABNORMAL LOW (ref >60.00)
Glucose, Bld: 96 mg/dL (ref 70–99)
Potassium: 4.1 mEq/L (ref 3.5–5.1)
Sodium: 138 mEq/L (ref 135–145)
Total Bilirubin: 0.5 mg/dL (ref 0.2–1.2)
Total Protein: 6.9 g/dL (ref 6.0–8.3)

## 2018-10-09 LAB — CBC WITH DIFFERENTIAL/PLATELET
Basophils Absolute: 0 10*3/uL (ref 0.0–0.1)
Basophils Relative: 0.7 % (ref 0.0–3.0)
Eosinophils Absolute: 0.1 10*3/uL (ref 0.0–0.7)
Eosinophils Relative: 1.2 % (ref 0.0–5.0)
HCT: 40.4 % (ref 36.0–46.0)
Hemoglobin: 14 g/dL (ref 12.0–15.0)
Lymphocytes Relative: 19.4 % (ref 12.0–46.0)
Lymphs Abs: 1.1 10*3/uL (ref 0.7–4.0)
MCHC: 34.7 g/dL (ref 30.0–36.0)
MCV: 92.4 fl (ref 78.0–100.0)
Monocytes Absolute: 0.3 10*3/uL (ref 0.1–1.0)
Monocytes Relative: 6.2 % (ref 3.0–12.0)
Neutro Abs: 4 10*3/uL (ref 1.4–7.7)
Neutrophils Relative %: 72.5 % (ref 43.0–77.0)
Platelets: 177 10*3/uL (ref 150.0–400.0)
RBC: 4.37 Mil/uL (ref 3.87–5.11)
RDW: 12.7 % (ref 11.5–15.5)
WBC: 5.5 10*3/uL (ref 4.0–10.5)

## 2018-10-09 LAB — LIPID PANEL
Cholesterol: 141 mg/dL (ref 0–200)
HDL: 61.8 mg/dL (ref >39.00)
LDL Cholesterol: 55 mg/dL (ref 0–99)
NonHDL: 79.06
Total CHOL/HDL Ratio: 2
Triglycerides: 118 mg/dL (ref 0.0–149.0)
VLDL: 23.6 mg/dL (ref 0.0–40.0)

## 2018-10-09 LAB — TSH: TSH: 1.31 u[IU]/mL (ref 0.35–4.50)

## 2018-10-09 MED ORDER — LEVOTHYROXINE SODIUM 88 MCG PO TABS: 88.0000 ug | ORAL_TABLET | Freq: Every day | ORAL | 3 refills | Status: DC

## 2018-10-09 MED ORDER — FOLIC ACID 400 MCG PO TABS: 400.0000 ug | ORAL_TABLET | Freq: Every day | ORAL | 3 refills | Status: DC

## 2018-10-09 MED ORDER — FLUCONAZOLE 150 MG PO TABS: 150.0000 mg | ORAL_TABLET | Freq: Once | ORAL | 0 refills | Status: AC

## 2018-10-09 MED ORDER — OMEPRAZOLE 40 MG PO CPDR: 40.0000 mg | DELAYED_RELEASE_CAPSULE | Freq: Every day | ORAL | 3 refills | Status: DC

## 2018-10-09 MED ORDER — ASPIRIN EC 81 MG PO TBEC: 81.0000 mg | DELAYED_RELEASE_TABLET | Freq: Every day | ORAL | 3 refills | Status: DC

## 2018-10-09 MED ORDER — DILTIAZEM HCL ER 60 MG PO CP12: 60.0000 mg | ORAL_CAPSULE | Freq: Two times a day (BID) | ORAL | 3 refills | Status: DC

## 2018-10-09 MED ORDER — VITAMIN D3 125 MCG (5000 UT) PO TABS: 5000.0000 [IU] | ORAL_TABLET | Freq: Every day | ORAL | 3 refills | Status: DC

## 2018-10-09 MED ORDER — HYDROCHLOROTHIAZIDE 12.5 MG PO CAPS: 12.5000 mg | ORAL_CAPSULE | Freq: Every day | ORAL | 3 refills | Status: DC

## 2018-10-09 MED ORDER — VITAMIN B-12 1000 MCG PO TABS: 1000.0000 ug | ORAL_TABLET | Freq: Every day | ORAL | 3 refills | Status: DC

## 2018-10-09 MED ORDER — ATORVASTATIN CALCIUM 20 MG PO TABS: 20.0000 mg | ORAL_TABLET | Freq: Every day | ORAL | 3 refills | Status: DC

## 2018-10-09 MED ORDER — HYDRALAZINE HCL 10 MG PO TABS: 20.0000 mg | ORAL_TABLET | Freq: Two times a day (BID) | ORAL | 3 refills | Status: DC | PRN

## 2018-10-09 MED ORDER — MUPIROCIN 2 % EX OINT: 1.0000 "application " | TOPICAL_OINTMENT | Freq: Two times a day (BID) | CUTANEOUS | 0 refills | Status: DC

## 2018-10-09 MED ORDER — TRAZODONE HCL 50 MG PO TABS: 50.0000 mg | ORAL_TABLET | Freq: Every evening | ORAL | 3 refills | Status: DC | PRN

## 2018-10-09 MED ORDER — ESCITALOPRAM OXALATE 5 MG PO TABS: 5.0000 mg | ORAL_TABLET | Freq: Every day | ORAL | 3 refills | Status: DC

## 2018-10-09 NOTE — Progress Notes (Signed)
Pre visit review using our clinic review tool, if applicable. No additional management support is needed unless otherwise documented below in the visit note. 

## 2018-10-09 NOTE — Telephone Encounter (Signed)
[facility] and d/c ambien   TMS

## 2018-10-09 NOTE — Progress Notes (Signed)
Chief Complaint  Patient presents with  . Follow-up   F/u  1. HTN controlled will check fasting labs today on cardizem 60 bid, hctz 12.5 mg qd, hydralazine 20 mg bid  2. C/o burning from urethra since 06/2018 and overactive bladder at night  3. Arthritis pains takes Tylenol prn  4. Insomnia better on Trazadone    Review of Systems  Constitutional: Negative for weight loss.  HENT: Negative for hearing loss.   Eyes: Negative for blurred vision.  Respiratory: Negative for shortness of breath.   Cardiovascular: Negative for chest pain.  Gastrointestinal: Negative for abdominal pain.  Genitourinary: Positive for dysuria.  Musculoskeletal: Positive for back pain, joint pain and neck pain.  Skin: Negative for rash.  Neurological: Negative for headaches.  Psychiatric/Behavioral: Negative for depression. The patient is nervous/anxious. The patient does not have insomnia.    Past Medical History:  Diagnosis Date  . Allergy   . Arthritis   . Coronary artery disease 04/2017   Mild to moderate CAD in LAD/diagonal by CTA (CT-FFR of apical LAD 0.79).  . Depression   . Diverticulosis   . Essential hypertension    Normal cardiolite 05/2006 EF 71%  . GERD (gastroesophageal reflux disease)   . Headache   . History of shingles   . Hyperlipidemia   . Hypothyroidism   . Mini stroke (HCC) 2011   . Occipital neuralgia   . Prediabetes   . Stroke (HCC)   . Stroke [facility])    MRI 04/2008 + left sup. frontal gyrus possibly puntate infarct   . Urinary tract infection   . Vitamin D deficiency    Past Surgical History:  Procedure Laterality Date  . ABDOMINAL HYSTERECTOMY    . BLADDER SURGERY     2003  . BREAST BIOPSY Right Over 20 years    Benign  . CHOLECYSTECTOMY    . gastroplication     . JOINT REPLACEMENT Left   . KNEE ARTHROSCOPY Left 2011  . TONSILLECTOMY AND ADENOIDECTOMY    . TOTAL KNEE ARTHROPLASTY Left 04/17/2015   Procedure: LEFT TOTAL KNEE ARTHROPLASTY;  Surgeon: Durene Romans,  MD;  Location: [facility];  Service: Orthopedics;  Laterality: Left;   Family History  Problem Relation Age of Onset  . Heart disease Mother   . Hypertension Mother   . Diabetes Mother   . Heart attack Mother 29  . Heart disease Father   . Heart attack Father 57  . Breast cancer Maternal Aunt   . Heart attack Brother    Social History   Socioeconomic History  . Marital status: Widowed    Spouse name: Not on file  . Number of children: Not on file  . Years of education: Not on file  . Highest education level: Not on file  Occupational History  . Not on file  Social Needs  . Financial resource strain: Not hard at all  . Food insecurity:    Worry: Never true    Inability: Never true  . Transportation needs:    Medical: No    Non-medical: No  Tobacco Use  . Smoking status: Passive Smoke Exposure - Never Smoker  . Smokeless tobacco: Never Used  . Tobacco comment: husbands and children smoked in home.   Substance and Sexual Activity  . Alcohol use: No  . Drug use: No  . Sexual activity: Never  Lifestyle  . Physical activity:    Days per week: 7 days    Minutes per session: 30 min  .  Stress: Not on file  Relationships  . Social connections:    Talks on phone: Not on file    Gets together: Not on file    Attends religious service: Not on file    Active member of club or organization: Not on file    Attends meetings of clubs or organizations: Not on file    Relationship status: Not on file  . Intimate partner violence:    Fear of current or ex partner: Not on file    Emotionally abused: Not on file    Physically abused: Not on file    Forced sexual activity: Not on file  Other Topics Concern  . Not on file  Social History Narrative   Widowed    [place]    Has kids and grandson    Current Meds  Medication Sig  . aspirin EC 81 MG tablet Take 1 tablet (81 mg total) by mouth daily.  Marland Kitchen atorvastatin (LIPITOR) 20 MG tablet Take 1 tablet (20 mg total) by mouth  daily.  . Biotin 1 MG CAPS Take 1 mg by mouth daily.   . Cholecalciferol (VITAMIN D3) 5000 units TABS Take 5,000 Units by mouth daily.  . ciprofloxacin (CIPRO) 500 MG tablet Take 1 tablet (500 mg total) by mouth 2 (two) times daily.  Marland Kitchen diltiazem (CARDIZEM SR) 60 MG 12 hr capsule Take 1 capsule (60 mg total) by mouth 2 (two) times daily.  Marland Kitchen escitalopram (LEXAPRO) 5 MG tablet Take 1 tablet (5 mg total) by mouth daily.  . folic acid (FOLVITE) 400 MCG tablet Take 400 mcg by mouth daily.  . hydrALAZINE (APRESOLINE) 10 MG tablet Take 2 tablets (20 mg total) by mouth 2 (two) times daily as needed. BP>140/>90  . hydrochlorothiazide (MICROZIDE) 12.5 MG capsule Take 1 capsule (12.5 mg total) by mouth daily. In am  . levothyroxine (SYNTHROID, LEVOTHROID) 88 MCG tablet Take 1 tablet (88 mcg total) by mouth daily before breakfast. Skip sundays  . LORazepam (ATIVAN) 0.5 MG tablet Take 1 tablet (0.5 mg total) by mouth 2 (two) times daily as needed for anxiety or sleep. Or 1 mg at night for sleep  . omeprazole (PRILOSEC) 40 MG capsule Take 1 capsule (40 mg total) by mouth daily.  Marland Kitchen tiZANidine (ZANAFLEX) 4 MG tablet Take 1 tablet (4 mg total) by mouth 2 (two) times daily as needed for muscle spasms.  . traZODone (DESYREL) 50 MG tablet Take 1-2 tablets (50-100 mg total) by mouth at bedtime as needed for sleep. 1 hr before bed   No Known Allergies No results found for this or any previous visit (from the past 2160 hour(s)). Objective  Body mass index is 31.11 kg/m. Wt Readings from Last 3 Encounters:  10/09/18 175 lb 9.6 oz (79.7 kg)  07/17/18 167 lb 1.9 oz (75.8 kg)  07/10/18 166 lb 9.6 oz (75.6 kg)   Temp Readings from Last 3 Encounters:  10/09/18 98.7 F (37.1 C) (Oral)  07/17/18 98.2 F (36.8 C) (Oral)  07/10/18 98.3 F (36.8 C) (Oral)   BP Readings from Last 3 Encounters:  10/09/18 138/88  07/17/18 136/78  07/10/18 117/72   Pulse Readings from Last 3 Encounters:  10/09/18 89  07/17/18 77   07/10/18 76    Physical Exam Vitals signs and nursing note reviewed.  Constitutional:      Appearance: Normal appearance. She is well-developed and well-groomed. She is obese.  HENT:     Head: Normocephalic and atraumatic.  Nose: Nose normal.     Mouth/Throat:     Mouth: Mucous membranes are moist.     Pharynx: Oropharynx is clear.  Eyes:     Conjunctiva/sclera: Conjunctivae normal.     Pupils: Pupils are equal, round, and reactive to light.  Cardiovascular:     Rate and Rhythm: Normal rate and regular rhythm.     Heart sounds: Normal heart sounds. No murmur.  Pulmonary:     Effort: Pulmonary effort is normal.     Breath sounds: Normal breath sounds.  Skin:    General: Skin is warm and dry.  Neurological:     General: No focal deficit present.     Mental Status: She is alert and oriented to person, place, and time. Mental status is at baseline.     Gait: Gait normal.     Comments: Walking with walker   Psychiatric:        Attention and Perception: Attention and perception normal.        Mood and Affect: Mood and affect normal.        Speech: Speech normal.        Behavior: Behavior normal. Behavior is cooperative.        Thought Content: Thought content normal.        Cognition and Memory: Cognition and memory normal.        Judgment: Judgment normal.     Assessment   1. HTN and hypothyroidism 2. Burning from urethra and overactive bladder  3. Arthritis  4. Insomnia 5. HM Plan   1. Cont meds  Fasting labs today  2, UA and culture today declines oxybutynin  tx as yeast vaginitis  Consider topical estrogen vs steroid if neg UTI  3. Prn Tylenol, biofreeze, prn Tumeric  4. Cont trazadone  5.  Flu shot utd Tdapconsider in future unable to find record pna23 utd prevnar had 06/16/15 shingrix 1/2 05/22/18 Consider hep B vaccine  A1C 5.0  colonoscopy had 06/03/11 Dr. Bosie Clos diverticulosis f/u in 10 years  mammo [facility] 04/08/17 negreferred    Out of age window pap last 01/26/10 neg   dexa 04/13/15 osteopenia consider repeat in future3-5 years check vitamin D -referred   hcv neg Lipid had 08/12/17 reviewed CT chest due 9/2019sch for pt 07/2018 Will need dermatology in future$ is issue currently  bactroban right lower leg wound  Provider: Dr. French Ana McLean-Scocuzza-[facility]

## 2018-10-09 NOTE — Patient Instructions (Addendum)
B12 1000 daily  Tumeric with Cumin capsules for arthritis   Osteoarthritis  Osteoarthritis is a type of arthritis that affects tissue that covers the ends of bones in joints (cartilage). Cartilage acts as a cushion between the bones and helps them move smoothly. Osteoarthritis results when cartilage in the joints gets worn down. Osteoarthritis is sometimes called "wear and tear" arthritis. Osteoarthritis is the most common form of arthritis. It often occurs in older people. It is a condition that gets worse over time (a progressive condition). Joints that are most often affected by this condition are in:  Fingers.  Toes.  Hips.  Knees.  Spine, including neck and lower back. What are the causes? This condition is caused by age-related wearing down of cartilage that covers the ends of bones. What increases the risk? The following factors may make you more likely to develop this condition:  Older age.  Being overweight or obese.  Overuse of joints, such as in athletes.  Past injury of a joint.  Past surgery on a joint.  Family history of osteoarthritis. What are the signs or symptoms? The main symptoms of this condition are pain, swelling, and stiffness in the joint. The joint may lose its shape over time. Small pieces of bone or cartilage may break off and float inside of the joint, which may cause more pain and damage to the joint. Small deposits of bone (osteophytes) may grow on the edges of the joint. Other symptoms may include:  A grating or scraping feeling inside the joint when you move it.  Popping or creaking sounds when you move. Symptoms may affect one or more joints. Osteoarthritis in a major joint, such as your knee or hip, can make it painful to walk or exercise. If you have osteoarthritis in your hands, you might not be able to grip items, twist your hand, or control small movements of your hands and fingers (fine motor skills). How is this diagnosed? This  condition may be diagnosed based on:  Your medical history.  A physical exam.  Your symptoms.  X-rays of the affected joint(s).  Blood tests to rule out other types of arthritis. How is this treated? There is no cure for this condition, but treatment can help to control pain and improve joint function. Treatment plans may include:  A prescribed exercise program that allows for rest and joint relief. You may work with a physical therapist.  A weight control plan.  Pain relief techniques, such as: ? Applying heat and cold to the joint. ? Electric pulses delivered to nerve endings under the skin (transcutaneous electrical nerve stimulation, or TENS). ? Massage. ? Certain nutritional supplements.  NSAIDs or prescription medicines to help relieve pain.  Medicine to help relieve pain and inflammation (corticosteroids). This can be given by mouth (orally) or as an injection.  Assistive devices, such as a brace, wrap, splint, specialized glove, or cane.  Surgery, such as: ? An osteotomy. This is done to reposition the bones and relieve pain or to remove loose pieces of bone and cartilage. ? Joint replacement surgery. You may need this surgery if you have very bad (advanced) osteoarthritis. Follow these instructions at home: Activity  Rest your affected joints as directed by your health care provider.  Do not drive or use heavy machinery while taking prescription pain medicine.  Exercise as directed. Your health care provider or physical therapist may recommend specific types of exercise, such as: ? Strengthening exercises. These are done to strengthen the muscles  that support joints that are affected by arthritis. They can be performed with weights or with exercise bands to add resistance. ? Aerobic activities. These are exercises, such as brisk walking or water aerobics, that get your heart pumping. ? Range-of-motion activities. These keep your joints easy to move. ? Balance and  agility exercises. Managing pain, stiffness, and swelling      If directed, apply heat to the affected area as often as told by your health care provider. Use the heat source that your health care provider recommends, such as a moist heat pack or a heating pad. ? If you have a removable assistive device, remove it as told by your health care provider. ? Place a towel between your skin and the heat source. If your health care provider tells you to keep the assistive device on while you apply heat, place a towel between the assistive device and the heat source. ? Leave the heat on for 20-30 minutes. ? Remove the heat if your skin turns bright red. This is especially important if you are unable to feel pain, heat, or cold. You may have a greater risk of getting burned.  If directed, put ice on the affected joint: ? If you have a removable assistive device, remove it as told by your health care provider. ? Put ice in a plastic bag. ? Place a towel between your skin and the bag. If your health care provider tells you to keep the assistive device on during icing, place a towel between the assistive device and the bag. ? Leave the ice on for 20 minutes, 2-3 times a day. General instructions  Take over-the-counter and prescription medicines only as told by your health care provider.  Maintain a healthy weight. Follow instructions from your health care provider for weight control. These may include dietary restrictions.  Do not use any products that contain nicotine or tobacco, such as cigarettes and e-cigarettes. These can delay bone healing. If you need help quitting, ask your health care provider.  Use assistive devices as directed by your health care provider.  Keep all follow-up visits as told by your health care provider. This is important. Where to find more information  [employer] of Arthritis and Musculoskeletal and Skin Diseases: www.niams.http://www.myers.net/  [employer] on  Aging: https://walker.com/  American College of Rheumatology: www.rheumatology.org Contact a health care provider if:  Your skin turns red.  You develop a rash.  You have pain that gets worse.  You have a fever along with joint or muscle aches. Get help right away if:  You lose a lot of weight.  You suddenly lose your appetite.  You have night sweats. Summary  Osteoarthritis is a type of arthritis that affects tissue covering the ends of bones in joints (cartilage).  This condition is caused by age-related wearing down of cartilage that covers the ends of bones.  The main symptom of this condition is pain, swelling, and stiffness in the joint.  There is no cure for this condition, but treatment can help to control pain and improve joint function. This information is not intended to replace advice given to you by your health care provider. Make sure you discuss any questions you have with your health care provider. Document Released: 07/22/2005 Document Revised: 04/28/2017 Document Reviewed: 03/25/2016 Elsevier Interactive Patient Education  2019 [employer].

## 2018-10-09 NOTE — Telephone Encounter (Signed)
[facility] has never filled ambien.

## 2018-10-09 NOTE — Telephone Encounter (Signed)
Patient was informed.  Patient understood and no questions, comments, or concerns at this time.  

## 2018-10-11 LAB — URINALYSIS, ROUTINE W REFLEX MICROSCOPIC
Bilirubin Urine: NEGATIVE
Glucose, UA: NEGATIVE
Hgb urine dipstick: NEGATIVE
Hyaline Cast: NONE SEEN /LPF
Ketones, ur: NEGATIVE
Nitrite: NEGATIVE
Specific Gravity, Urine: 1.02 (ref 1.001–1.03)
Squamous Epithelial / LPF: NONE SEEN /HPF (ref <=5)
WBC, UA: >=60 /HPF — AB (ref 0–5)
pH: 7 (ref 5.0–8.0)

## 2018-10-11 LAB — URINE CULTURE
MICRO NUMBER:: 287059
SPECIMEN QUALITY:: ADEQUATE

## 2018-10-12 ENCOUNTER — Other Ambulatory Visit: Payer: Self-pay | Admitting: Internal Medicine

## 2018-10-12 ENCOUNTER — Telehealth: Payer: Self-pay

## 2018-10-12 DIAGNOSIS — N3000 Acute cystitis without hematuria: Secondary | ICD-10-CM

## 2018-10-12 MED ORDER — CIPROFLOXACIN HCL 500 MG PO TABS: 500.0000 mg | ORAL_TABLET | Freq: Two times a day (BID) | ORAL | 0 refills | Status: DC

## 2018-10-12 NOTE — Telephone Encounter (Signed)
-----   Message from Bevelyn Buckles, MD sent at 10/12/2018  8:15 AM EDT ----- Thyroid lab normal continue same dose  Kidneys ok continue to increase water intake  Cholesterol and blood cts normal  E Coli UTI, sent Cipro 500 mg 2x per day with food

## 2018-10-12 NOTE — Telephone Encounter (Signed)
Called and informed pt of her lab results and that medication was sent to pharmacy for UTI, pt understood.  Coralee North, CMA

## 2018-10-28 ENCOUNTER — Other Ambulatory Visit: Payer: Self-pay | Admitting: Internal Medicine

## 2018-10-28 ENCOUNTER — Telehealth: Payer: Self-pay | Admitting: Internal Medicine

## 2018-10-28 DIAGNOSIS — I10 Essential (primary) hypertension: Secondary | ICD-10-CM

## 2018-10-28 MED ORDER — HYDRALAZINE HCL 10 MG PO TABS: 20.0000 mg | ORAL_TABLET | Freq: Two times a day (BID) | ORAL | 3 refills | Status: DC | PRN

## 2018-10-28 NOTE — Telephone Encounter (Signed)
Received a call from [facility] requesting a refill on Hydralazine 10mg  tabs.   I called the pharmacy and they said that the refill that was sent in on 10-09-2018 was never received.  Can that prescription be resubmitted

## 2018-10-30 ENCOUNTER — Encounter: Payer: Self-pay | Admitting: Internal Medicine

## 2018-11-02 MED ORDER — HYDRALAZINE HCL 10 MG PO TABS: 20.0000 mg | ORAL_TABLET | Freq: Two times a day (BID) | ORAL | 3 refills | Status: DC | PRN

## 2018-11-02 NOTE — Addendum Note (Signed)
Addended by: Elise Benne T on: 11/02/2018 08:59 AM   Modules accepted: Orders

## 2018-11-16 ENCOUNTER — Telehealth: Payer: Self-pay | Admitting: Internal Medicine

## 2018-11-16 NOTE — Telephone Encounter (Signed)
Patient called nurses line on 11/14/18 with complaint of head ache , sinus pressure and drainage. Called patient, whom lives in assisted living and states she cannot leave her room has been quarantined for COVID -19 preventative.Patient says today head ache is worse compared to call on 11/14/18 from 3 to 5, denies sore throat and denies SOB, afebrile , just has a lot of drainage and sore throat requesting medication for sinus infection.

## 2018-11-17 ENCOUNTER — Ambulatory Visit (INDEPENDENT_AMBULATORY_CARE_PROVIDER_SITE_OTHER): Payer: Medicare HMO | Admitting: Internal Medicine

## 2018-11-17 DIAGNOSIS — I10 Essential (primary) hypertension: Secondary | ICD-10-CM

## 2018-11-17 DIAGNOSIS — J321 Chronic frontal sinusitis: Secondary | ICD-10-CM | POA: Diagnosis not present

## 2018-11-17 DIAGNOSIS — J309 Allergic rhinitis, unspecified: Secondary | ICD-10-CM | POA: Diagnosis not present

## 2018-11-17 MED ORDER — FLUTICASONE PROPIONATE 50 MCG/ACT NA SUSP: 2.0000 | Freq: Every day | NASAL | 12 refills | Status: DC | PRN

## 2018-11-17 MED ORDER — CETIRIZINE HCL 5 MG PO TABS: 5.0000 mg | ORAL_TABLET | Freq: Every evening | ORAL | 3 refills | Status: DC | PRN

## 2018-11-17 MED ORDER — SALINE SPRAY 0.65 % NA SOLN: 2.0000 | NASAL | 12 refills | Status: DC | PRN

## 2018-11-17 MED ORDER — AMOXICILLIN-POT CLAVULANATE 875-125 MG PO TABS: 1.0000 | ORAL_TABLET | Freq: Two times a day (BID) | ORAL | 0 refills | Status: DC

## 2018-11-17 NOTE — Progress Notes (Signed)
Telephone Note  I connected with Kimberly Cook   on 11/17/18 at 11:35 AM EDT by telephone and verified that I am speaking with the correct person using two identifiers.  Location patient: home Location provider:work  Persons participating in the virtual visit: patient, provider  I discussed the limitations of evaluation and management by telemedicine and the availability of in person appointments. The patient expressed understanding and agreed to proceed.   HPI: Sinus pressure around eyes and eye watery with nasal congestion and h/a. Temp 97 or 7F. She has tried Tylenol Office manager and Chlortabs otc prev NS and Flonase helped and she watns refill she lives at [facility] and has not been exposed to COVID 19   HTN BP 123/81 improved she is happy with the results   ROS: See pertinent positives and negatives per HPI.  Past Medical History:  Diagnosis Date  . Allergy   . Arthritis   . Coronary artery disease 04/2017   Mild to moderate CAD in LAD/diagonal by CTA (CT-FFR of apical LAD 0.79).  . Depression   . Diverticulosis   . Essential hypertension    Normal cardiolite 05/2006 EF 71%  . GERD (gastroesophageal reflux disease)   . Headache   . History of shingles   . Hyperlipidemia   . Hypothyroidism   . Mini stroke (HCC) 2011   . Occipital neuralgia   . Prediabetes   . Stroke (HCC)   . Stroke [facility])    MRI 04/2008 + left sup. frontal gyrus possibly puntate infarct   . Urinary tract infection   . Vitamin D deficiency     Past Surgical History:  Procedure Laterality Date  . ABDOMINAL HYSTERECTOMY    . BLADDER SURGERY     2003  . BREAST BIOPSY Right Over 20 years    Benign  . CHOLECYSTECTOMY    . gastroplication     . JOINT REPLACEMENT Left   . KNEE ARTHROSCOPY Left 2011  . TONSILLECTOMY AND ADENOIDECTOMY    . TOTAL KNEE ARTHROPLASTY Left 04/17/2015   Procedure: LEFT TOTAL KNEE ARTHROPLASTY;  Surgeon: Durene Romans, MD;  Location: [facility];  Service: Orthopedics;   Laterality: Left;    Family History  Problem Relation Age of Onset  . Heart disease Mother   . Hypertension Mother   . Diabetes Mother   . Heart attack Mother 67  . Heart disease Father   . Heart attack Father 29  . Breast cancer Maternal Aunt   . Heart attack Brother     SOCIAL HX: lives [place] 2 sons widowed    Current Outpatient Medications:  .  amoxicillin-clavulanate (AUGMENTIN) 875-125 MG tablet, Take 1 tablet by mouth 2 (two) times daily. With food, Disp: 14 tablet, Rfl: 0 .  aspirin EC 81 MG tablet, Take 1 tablet (81 mg total) by mouth daily., Disp: 90 tablet, Rfl: 3 .  atorvastatin (LIPITOR) 20 MG tablet, Take 1 tablet (20 mg total) by mouth daily., Disp: 90 tablet, Rfl: 3 .  Biotin 1 MG CAPS, Take 1 mg by mouth daily. , Disp: , Rfl:  .  cetirizine (ZYRTEC) 5 MG tablet, Take 1-2 tablets (5-10 mg total) by mouth at bedtime as needed for allergies., Disp: 90 tablet, Rfl: 3 .  Cholecalciferol (VITAMIN D3) 125 MCG (5000 UT) TABS, Take 1 tablet (5,000 Units total) by mouth daily., Disp: 90 tablet, Rfl: 3 .  diltiazem (CARDIZEM SR) 60 MG 12 hr capsule, Take 1 capsule (60 mg total) by mouth  2 (two) times daily., Disp: 180 capsule, Rfl: 3 .  escitalopram (LEXAPRO) 5 MG tablet, Take 1 tablet (5 mg total) by mouth daily., Disp: 90 tablet, Rfl: 3 .  fluticasone (FLONASE) 50 MCG/ACT nasal spray, Place 2 sprays into both nostrils daily as needed for allergies or rhinitis. Use after nasal saline, Disp: 16 g, Rfl: 12 .  folic acid (FOLVITE) 400 MCG tablet, Take 1 tablet (400 mcg total) by mouth daily. SEPARATE ALL SUPPLEMENTS TO LUNCH OR DINNER AND PRILOSEC NOT TO MESS W/THYROID MED, Disp: 90 tablet, Rfl: 3 .  hydrALAZINE (APRESOLINE) 10 MG tablet, Take 2 tablets (20 mg total) by mouth 2 (two) times daily as needed. BP>140/>90, Disp: 180 tablet, Rfl: 3 .  hydrochlorothiazide (MICROZIDE) 12.5 MG capsule, Take 1 capsule (12.5 mg total) by mouth daily. In am, Disp: 90 capsule, Rfl: 3 .   levothyroxine (SYNTHROID, LEVOTHROID) 88 MCG tablet, Take 1 tablet (88 mcg total) by mouth daily before breakfast. Skip sundays, Disp: 90 tablet, Rfl: 3 .  LORazepam (ATIVAN) 0.5 MG tablet, Take 1 tablet (0.5 mg total) by mouth 2 (two) times daily as needed for anxiety or sleep. Or 1 mg at night for sleep, Disp: 60 tablet, Rfl: 5 .  mupirocin ointment (BACTROBAN) 2 %, Apply 1 application topically 2 (two) times daily. Right lower leg, Disp: 30 g, Rfl: 0 .  omeprazole (PRILOSEC) 40 MG capsule, Take 1 capsule (40 mg total) by mouth daily., Disp: 90 capsule, Rfl: 3 .  sodium chloride (OCEAN) 0.65 % SOLN nasal spray, Place 2 sprays into both nostrils as needed for congestion. Use 1st, Disp: 1 Bottle, Rfl: 12 .  tiZANidine (ZANAFLEX) 4 MG tablet, Take 1 tablet (4 mg total) by mouth 2 (two) times daily as needed for muscle spasms., Disp: 60 tablet, Rfl: 5 .  traZODone (DESYREL) 50 MG tablet, Take 1-2 tablets (50-100 mg total) by mouth at bedtime as needed for sleep. 1 hr before bed, Disp: 60 tablet, Rfl: 3 .  vitamin B-12 (CYANOCOBALAMIN) 1000 MCG tablet, Take 1 tablet (1,000 mcg total) by mouth daily., Disp: 90 tablet, Rfl: 3  EXAM:  VITALS per patient if applicable:  GENERAL: alert, oriented, appears well and in no acute distress  PSYCH/NEURO: pleasant and cooperative, no obvious depression or anxiety, speech and thought processing grossly intact  ASSESSMENT AND PLAN:  Discussed the following assessment and plan:  Frontal sinusitis, unspecified chronicity - Plan: sodium chloride (OCEAN) 0.65 % SOLN nasal spray, fluticasone (FLONASE) 50 MCG/ACT nasal spray, amoxicillin-clavulanate (AUGMENTIN) 875-125 MG tablet, cetirizine (ZYRTEC) 5 MG tablet  Allergic rhinitis, unspecified seasonality, unspecified trigger - Plan: cetirizine (ZYRTEC) 5 MG tablet  Essential hypertension  Stop Chlortabs on beers criteria and change to zyrtec 5-10 mg qhs prn  Let me know if not better in 1 week   HTN controlled  for now   She is happy [facility] approved monthly money and retropay     I discussed the assessment and treatment plan with the patient. The patient was provided an opportunity to ask questions and all were answered. The patient agreed with the plan and demonstrated an understanding of the instructions.   The patient was advised to call back or seek an in-person evaluation if the symptoms worsen or if the condition fails to improve as anticipated.  Time spent 15 minutes   Bevelyn Buckles, MD

## 2018-11-17 NOTE — Telephone Encounter (Signed)
Patient has no capability to do a virtual appointment, I did schedule patient for telephone visit today at 1130

## 2018-11-17 NOTE — Telephone Encounter (Signed)
Schedule [email or web address]  Thanks [employer]

## 2018-11-30 ENCOUNTER — Telehealth: Payer: Self-pay | Admitting: Internal Medicine

## 2018-11-30 NOTE — Telephone Encounter (Signed)
Copied from CRM [id number]. Topic: Quick Communication - See Telephone Encounter >> Nov 30, 2018 10:56 AM Louie Bun, Rosey Bath D wrote: CRM for notification. See Telephone encounter for: 11/30/18. Patient called and said that her sinus infection is not getting better and would like to talk to Dr. Shirlee Latch or her CMA about this. Also she has started up with a bad cough. Please call patient back, thanks.

## 2018-11-30 NOTE — Telephone Encounter (Signed)
Spoken to patient, she stated she feels better over all after taking her abx.  But her head is still very stuffy and draining and she's coughing. She wants to know if there is anything else to take for sx.

## 2018-12-02 ENCOUNTER — Other Ambulatory Visit: Payer: Self-pay | Admitting: Internal Medicine

## 2018-12-02 DIAGNOSIS — J309 Allergic rhinitis, unspecified: Secondary | ICD-10-CM

## 2018-12-02 MED ORDER — MONTELUKAST SODIUM 10 MG PO TABS: 10.0000 mg | ORAL_TABLET | Freq: Every day | ORAL | 2 refills | Status: DC

## 2018-12-02 NOTE — Telephone Encounter (Signed)
We can try singular which a pill at night for allergies  Does she want to try this?   TSM

## 2018-12-02 NOTE — Telephone Encounter (Signed)
Patient is ok with the singular rx.

## 2018-12-10 ENCOUNTER — Other Ambulatory Visit: Payer: Self-pay | Admitting: Internal Medicine

## 2018-12-10 DIAGNOSIS — E538 Deficiency of other specified B group vitamins: Secondary | ICD-10-CM

## 2018-12-10 MED ORDER — FOLIC ACID 400 MCG PO TABS: 400.0000 ug | ORAL_TABLET | Freq: Every day | ORAL | 3 refills | Status: DC

## 2018-12-30 ENCOUNTER — Telehealth: Payer: Self-pay | Admitting: Internal Medicine

## 2018-12-30 NOTE — Telephone Encounter (Signed)
Copied from CRM [id number]. Topic: Quick Communication - Home Health Verbal Orders >> Dec 30, 2018 11:32 AM Jaquita Rector A wrote: Caller/Agency: [facility]  Callback Number: [phone] Requesting OT/PT/Skilled Nursing/Social Work/Speech Therapy: PT and OT  Frequency:   Requesting orders for PT and OT per patients request. Per [place] orders can be uploaded to Epic because they have access to Epic she stated

## 2018-12-30 NOTE — Telephone Encounter (Signed)
Agree to orders  TMS

## 2018-12-31 DIAGNOSIS — M545 Low back pain: Secondary | ICD-10-CM | POA: Diagnosis not present

## 2018-12-31 DIAGNOSIS — M19041 Primary osteoarthritis, right hand: Secondary | ICD-10-CM | POA: Diagnosis not present

## 2018-12-31 DIAGNOSIS — M19042 Primary osteoarthritis, left hand: Secondary | ICD-10-CM | POA: Diagnosis not present

## 2018-12-31 DIAGNOSIS — G8929 Other chronic pain: Secondary | ICD-10-CM | POA: Diagnosis not present

## 2018-12-31 DIAGNOSIS — N183 Chronic kidney disease, stage 3 (moderate): Secondary | ICD-10-CM | POA: Diagnosis not present

## 2018-12-31 DIAGNOSIS — I2510 Atherosclerotic heart disease of native coronary artery without angina pectoris: Secondary | ICD-10-CM | POA: Diagnosis not present

## 2018-12-31 DIAGNOSIS — I951 Orthostatic hypotension: Secondary | ICD-10-CM | POA: Diagnosis not present

## 2018-12-31 DIAGNOSIS — M5030 Other cervical disc degeneration, unspecified cervical region: Secondary | ICD-10-CM | POA: Diagnosis not present

## 2018-12-31 DIAGNOSIS — I129 Hypertensive chronic kidney disease with stage 1 through stage 4 chronic kidney disease, or unspecified chronic kidney disease: Secondary | ICD-10-CM | POA: Diagnosis not present

## 2018-12-31 NOTE — Telephone Encounter (Signed)
[place] was informed of verbal orders

## 2019-01-01 ENCOUNTER — Ambulatory Visit (INDEPENDENT_AMBULATORY_CARE_PROVIDER_SITE_OTHER): Payer: Medicare HMO | Admitting: Internal Medicine

## 2019-01-01 ENCOUNTER — Other Ambulatory Visit: Payer: Self-pay

## 2019-01-01 ENCOUNTER — Telehealth: Payer: Self-pay | Admitting: Internal Medicine

## 2019-01-01 DIAGNOSIS — M5136 Other intervertebral disc degeneration, lumbar region: Secondary | ICD-10-CM

## 2019-01-01 DIAGNOSIS — M19011 Primary osteoarthritis, right shoulder: Secondary | ICD-10-CM

## 2019-01-01 DIAGNOSIS — M5416 Radiculopathy, lumbar region: Secondary | ICD-10-CM

## 2019-01-01 DIAGNOSIS — M419 Scoliosis, unspecified: Secondary | ICD-10-CM

## 2019-01-01 DIAGNOSIS — M47816 Spondylosis without myelopathy or radiculopathy, lumbar region: Secondary | ICD-10-CM | POA: Diagnosis not present

## 2019-01-01 DIAGNOSIS — M19012 Primary osteoarthritis, left shoulder: Secondary | ICD-10-CM | POA: Diagnosis not present

## 2019-01-01 DIAGNOSIS — R269 Unspecified abnormalities of gait and mobility: Secondary | ICD-10-CM | POA: Diagnosis not present

## 2019-01-01 DIAGNOSIS — M5030 Other cervical disc degeneration, unspecified cervical region: Secondary | ICD-10-CM

## 2019-01-01 DIAGNOSIS — M19041 Primary osteoarthritis, right hand: Secondary | ICD-10-CM | POA: Diagnosis not present

## 2019-01-01 DIAGNOSIS — M19042 Primary osteoarthritis, left hand: Secondary | ICD-10-CM | POA: Diagnosis not present

## 2019-01-01 DIAGNOSIS — I2510 Atherosclerotic heart disease of native coronary artery without angina pectoris: Secondary | ICD-10-CM | POA: Diagnosis not present

## 2019-01-01 DIAGNOSIS — N183 Chronic kidney disease, stage 3 (moderate): Secondary | ICD-10-CM | POA: Diagnosis not present

## 2019-01-01 DIAGNOSIS — M545 Low back pain: Secondary | ICD-10-CM | POA: Diagnosis not present

## 2019-01-01 DIAGNOSIS — I129 Hypertensive chronic kidney disease with stage 1 through stage 4 chronic kidney disease, or unspecified chronic kidney disease: Secondary | ICD-10-CM | POA: Diagnosis not present

## 2019-01-01 DIAGNOSIS — G8929 Other chronic pain: Secondary | ICD-10-CM | POA: Diagnosis not present

## 2019-01-01 DIAGNOSIS — I951 Orthostatic hypotension: Secondary | ICD-10-CM | POA: Diagnosis not present

## 2019-01-01 MED ORDER — TIZANIDINE HCL 4 MG PO TABS: 4.0000 mg | ORAL_TABLET | Freq: Two times a day (BID) | ORAL | 5 refills | Status: DC | PRN

## 2019-01-01 MED ORDER — DICLOFENAC SODIUM 1 % TD GEL: TRANSDERMAL | 11 refills | Status: DC

## 2019-01-01 MED ORDER — OXYCODONE-ACETAMINOPHEN 5-325 MG PO TABS: 0.5000 | ORAL_TABLET | Freq: Two times a day (BID) | ORAL | 0 refills | Status: DC | PRN

## 2019-01-01 NOTE — Telephone Encounter (Signed)
Verbal has been given. 

## 2019-01-01 NOTE — Telephone Encounter (Signed)
Ok to give verbal for skill nursing?

## 2019-01-01 NOTE — Patient Instructions (Signed)
Also please call to schedule your bone density and mammogram see attach sheet with info  Chronic Back Pain When back pain lasts longer than 3 months, it is called chronic back pain.The cause of your back pain may not be known. Some common causes include:  Wear and tear (degenerative disease) of the bones, ligaments, or disks in your back.  Inflammation and stiffness in your back (arthritis). People who have chronic back pain often go through certain periods in which the pain is more intense (flare-ups). Many people can learn to manage the pain with home care. Follow these instructions at home: Pay attention to any changes in your symptoms. Take these actions to help with your pain: Activity   Avoid bending and other activities that make the problem worse.  Maintain a proper position when standing or sitting: ? When standing, keep your upper back and neck straight, with your shoulders pulled back. Avoid slouching. ? When sitting, keep your back straight and relax your shoulders. Do not round your shoulders or pull them backward.  Do not sit or stand in one place for long periods of time.  Take brief periods of rest throughout the day. This will reduce your pain. Resting in a lying or standing position is usually better than sitting to rest.  When you are resting for longer periods, mix in some mild activity or stretching between periods of rest. This will help to prevent stiffness and pain.  Get regular exercise. Ask your health care provider what activities are safe for you.  Do not lift anything that is heavier than 10 lb (4.5 kg). Always use proper lifting technique, which includes: ? Bending your knees. ? Keeping the load close to your body. ? Avoiding twisting.  Sleep on a firm mattress in a comfortable position. Try lying on your side with your knees slightly bent. If you lie on your back, put a pillow under your knees. Managing pain  If directed, apply ice to the painful  area. Your health care provider may recommend applying ice during the first 24-48 hours after a flare-up begins. ? Put ice in a plastic bag. ? Place a towel between your skin and the bag. ? Leave the ice on for 20 minutes, 2-3 times per day.  If directed, apply heat to the affected area as often as told by your health care provider. Use the heat source that your health care provider recommends, such as a moist heat pack or a heating pad. ? Place a towel between your skin and the heat source. ? Leave the heat on for 20-30 minutes. ? Remove the heat if your skin turns bright red. This is especially important if you are unable to feel pain, heat, or cold. You may have a greater risk of getting burned.  Try soaking in a warm tub.  Take over-the-counter and prescription medicines only as told by your health care provider.  Keep all follow-up visits as told by your health care provider. This is important. Contact a health care provider if:  You have pain that is not relieved with rest or medicine. Get help right away if:  You have weakness or numbness in one or both of your legs or feet.  You have trouble controlling your bladder or your bowels.  You have nausea or vomiting.  You have pain in your abdomen.  You have shortness of breath or you faint. This information is not intended to replace advice given to you by your health care provider.  Make sure you discuss any questions you have with your health care provider. Document Released: 08/29/2004 Document Revised: 02/26/2018 Document Reviewed: 01/29/2017 Elsevier Interactive Patient Education  2019 [employer]  Degenerative Disk Disease  Degenerative disk disease is a condition caused by changes that occur in the spinal disks as a person ages. Spinal disks are soft and compressible disks located between the bones of your spine (vertebrae). These disks act like shock absorbers. Degenerative disk disease can affect the whole spine.  However, the neck and lower back are most often affected. Many changes can occur in the spinal disks with aging, such as:  The spinal disks may dry and shrink.  Small tears may occur in the tough, outer covering of the disk (annulus).  The disk space may become smaller due to loss of water.  Abnormal growths in the bone (spurs) may occur. This can put pressure on the nerve roots exiting the spinal canal, causing pain.  The spinal canal may become narrowed. What are the causes? This condition may be caused by:  Normal degeneration with age.  Injuries.  Certain activities and sports that cause damage. What increases the risk? The following factors may make you more likely to develop this condition:  Being overweight.  Having a family history of degenerative disk disease.  Smoking.  Sudden injury.  Doing work that requires heavy lifting. What are the signs or symptoms? Symptoms of this condition include:  Pain that varies in intensity. Some people have no pain, while others have severe pain. The location of the pain depends on the part of your backbone that is affected. You may have: ? Pain in your neck or arm if a disk in your neck area is affected. ? Pain in your back, buttocks, or legs if a disk in your lower back is affected.  Pain that becomes worse while bending or reaching up, or with twisting movements.  Pain that may start gradually and then get worse as time passes. It may also start after a major or minor injury.  Numbness or tingling in the arms or legs. How is this diagnosed? This condition may be diagnosed based on:  Your symptoms and medical history.  A physical exam.  Imaging tests, including: ? An X-ray of the spine. ? MRI. How is this treated? This condition may be treated with:  Medicines.  Rehabilitation exercises. These activities aim to strengthen muscles in your back and abdomen to better support your spine. If treatments do not help to  relieve your symptoms or you have severe pain, you may need surgery. Follow these instructions at home: Medicines  Take over-the-counter and prescription medicines only as told by your health care provider.  Do not drive or use heavy machinery while taking prescription pain medicine.  If you are taking prescription pain medicine, take actions to prevent or treat constipation. Your health care provider may recommend that you: ? Drink enough fluid to keep your urine pale yellow. ? Eat foods that are high in fiber, such as fresh fruits and vegetables, whole grains, and beans. ? Limit foods that are high in fat and processed sugars, such as fried or sweet foods. ? Take an over-the-counter or prescription medicine for constipation. Activity  Rest as told by your health care provider.  Ask your health care provider what activities are safe for you. Return to your normal activities as directed.  Avoid sitting for a long time without moving. Get up to take short walks every 1-2 hours. This  is important to improve blood flow and breathing. Ask for help if you feel weak or unsteady.  Perform relaxation exercises as told by your health care provider.  Maintain good posture.  Do not lift anything that is heavier than 10 lb (4.5 kg), or the limit that you are told, until your health care provider says that it is safe.  Follow proper lifting and walking techniques as told by your health care provider. Managing pain, stiffness, and swelling   If directed, put ice on the painful area. Icing can help to relieve pain. ? Put ice in a plastic bag. ? Place a towel between your skin and the bag. ? Leave the ice on for 20 minutes, 2-3 times a day.  If directed, apply heat to the painful area as often as told by your health care provider. Heat can reduce the stiffness of your muscles. Use the heat source that your health care provider recommends, such as a moist heat pack or a heating pad. ? Place a  towel between your skin and the heat source. ? Leave the heat on for 20-30 minutes. ? Remove the heat if your skin turns bright red. This is especially important if you are unable to feel pain, heat, or cold. You may have a greater risk of getting burned. General instructions  Change your sitting, standing, and sleeping habits as told by your health care provider.  Avoid sitting in the same position for long periods of time. Change positions frequently.  Lose weight or maintain a healthy weight as told by your health care provider.  Do not use any products that contain nicotine or tobacco, such as cigarettes and e-cigarettes. If you need help quitting, ask your health care provider.  Wear supportive footwear.  Keep all follow-up visits as told by your health care provider. This is important. This may include visits for physical therapy. Contact a health care provider if you:  Have pain that does not go away within 1-4 weeks.  Lose your appetite.  Lose weight without trying. Get help right away if you:  Have severe pain.  Notice weakness in your arms, hands, or legs.  Begin to lose control of your bladder or bowel movements.  Have fevers or night sweats. Summary  Degenerative disk disease is a condition caused by changes that occur in the spinal disks as a person ages.  Degenerative disk disease can affect the whole spine. However, the neck and lower back are most often affected.  Take over-the-counter and prescription medicines only as told by your health care provider. This information is not intended to replace advice given to you by your health care provider. Make sure you discuss any questions you have with your health care provider. Document Released: 05/19/2007 Document Revised: 07/17/2017 Document Reviewed: 07/17/2017 Elsevier Interactive Patient Education  2019 [employer].

## 2019-01-01 NOTE — Progress Notes (Signed)
Virtual Visit via Video Note  I connected with Kimberly Cook   on 01/01/19 at 11:10 AM EDT by a video enabled telemedicine application and verified that I am speaking with the correct person using two identifiers.  Location patient: home Location provider:work  Persons participating in the virtual visit: patient, provider  I discussed the limitations of evaluation and management by telemedicine and the availability of in person appointments. The patient expressed understanding and agreed to proceed.   HPI: 1. C/o low back pain started yesterday 12/10 horrible severe chronic but worse pain radiated to right hip and down her legs. Pain was throbbing. Legs felt like rubber. Pain was worse with activity I.e washing dishes and drying off in the shower with shower chair made pain worse. She tried hot water from the shower and muscle relaxer with some relief. She states its hard to walk 4 ft and she has trouble walking. Denies saddle anesthesia, bowel or bladder incontinence but legs feel weak and like going to give out/way. She feels off balance and like going to fall. PT yesterday and will come Monday to work with her and OT came today  Reviewed Xray low back 2012 and 2015 scoliosis and DDD, facet arthropathy +  2. C/o b/l shoulder pain new since yesterday tried heat from the shower with some relief    ROS: See pertinent positives and negatives per HPI.  Past Medical History:  Diagnosis Date  . Allergy   . Arthritis   . Coronary artery disease 04/2017   Mild to moderate CAD in LAD/diagonal by CTA (CT-FFR of apical LAD 0.79).  . Depression   . Diverticulosis   . Essential hypertension    Normal cardiolite 05/2006 EF 71%  . GERD (gastroesophageal reflux disease)   . Headache   . History of shingles   . Hyperlipidemia   . Hypothyroidism   . Mini stroke (HCC) 2011   . Occipital neuralgia   . Prediabetes   . Stroke (HCC)   . Stroke [facility](HCC)    MRI 04/2008 + left sup. frontal gyrus possibly  puntate infarct   . Urinary tract infection   . Vitamin D deficiency     Past Surgical History:  Procedure Laterality Date  . ABDOMINAL HYSTERECTOMY    . BLADDER SURGERY     2003  . BREAST BIOPSY Right Over 20 years    Benign  . CHOLECYSTECTOMY    . gastroplication     . JOINT REPLACEMENT Left   . KNEE ARTHROSCOPY Left 2011  . TONSILLECTOMY AND ADENOIDECTOMY    . TOTAL KNEE ARTHROPLASTY Left 04/17/2015   Procedure: LEFT TOTAL KNEE ARTHROPLASTY;  Surgeon: Durene RomansMatthew Olin, MD;  Location: [facility];  Service: Orthopedics;  Laterality: Left;    Family History  Problem Relation Age of Onset  . Heart disease Mother   . Hypertension Mother   . Diabetes Mother   . Heart attack Mother [id number]  . Heart disease Father   . Heart attack Father [id number]  . Breast cancer Maternal Aunt   . Heart attack Brother     [facility] HX[place]    Current Outpatient Medications:  .  aspirin EC 81 MG tablet, Take 1 tablet (81 mg total) by mouth daily., Disp: 90 tablet, Rfl: 3 .  atorvastatin (LIPITOR) 20 MG tablet, Take 1 tablet (20 mg total) by mouth daily., Disp: 90 tablet, Rfl: 3 .  Biotin 1 MG CAPS, Take 1 mg by mouth daily. , Disp: , Rfl:  .  cetirizine (ZYRTEC) 5 MG tablet, Take 1-2 tablets (5-10 mg total) by mouth at bedtime as needed for allergies., Disp: 90 tablet, Rfl: 3 .  Cholecalciferol (VITAMIN D3) 125 MCG (5000 UT) TABS, Take 1 tablet (5,000 Units total) by mouth daily., Disp: 90 tablet, Rfl: 3 .  diltiazem (CARDIZEM SR) 60 MG 12 hr capsule, Take 1 capsule (60 mg total) by mouth 2 (two) times daily., Disp: 180 capsule, Rfl: 3 .  escitalopram (LEXAPRO) 5 MG tablet, Take 1 tablet (5 mg total) by mouth daily., Disp: 90 tablet, Rfl: 3 .  fluticasone (FLONASE) 50 MCG/ACT nasal spray, Place 2 sprays into both nostrils daily as needed for allergies or rhinitis. Use after nasal saline, Disp: 16 g, Rfl: 12 .  folic acid (FOLVITE) 400 MCG tablet, Take 1 tablet (400 mcg total) by mouth daily. SEPARATE  ALL SUPPLEMENTS TO LUNCH OR DINNER AND PRILOSEC NOT TO MESS W/THYROID MED, Disp: 90 tablet, Rfl: 3 .  hydrALAZINE (APRESOLINE) 10 MG tablet, Take 2 tablets (20 mg total) by mouth 2 (two) times daily as needed. BP>140/>90, Disp: 180 tablet, Rfl: 3 .  hydrochlorothiazide (MICROZIDE) 12.5 MG capsule, Take 1 capsule (12.5 mg total) by mouth daily. In am, Disp: 90 capsule, Rfl: 3 .  levothyroxine (SYNTHROID, LEVOTHROID) 88 MCG tablet, Take 1 tablet (88 mcg total) by mouth daily before breakfast. Skip sundays, Disp: 90 tablet, Rfl: 3 .  LORazepam (ATIVAN) 0.5 MG tablet, Take 1 tablet (0.5 mg total) by mouth 2 (two) times daily as needed for anxiety or sleep. Or 1 mg at night for sleep, Disp: 60 tablet, Rfl: 5 .  montelukast (SINGULAIR) 10 MG tablet, Take 1 tablet (10 mg total) by mouth at bedtime., Disp: 30 tablet, Rfl: 2 .  mupirocin ointment (BACTROBAN) 2 %, Apply 1 application topically 2 (two) times daily. Right lower leg, Disp: 30 g, Rfl: 0 .  omeprazole (PRILOSEC) 40 MG capsule, Take 1 capsule (40 mg total) by mouth daily., Disp: 90 capsule, Rfl: 3 .  sodium chloride (OCEAN) 0.65 % SOLN nasal spray, Place 2 sprays into both nostrils as needed for congestion. Use 1st, Disp: 1 Bottle, Rfl: 12 .  tiZANidine (ZANAFLEX) 4 MG tablet, Take 1 tablet (4 mg total) by mouth 2 (two) times daily as needed for muscle spasms., Disp: 60 tablet, Rfl: 5 .  traZODone (DESYREL) 50 MG tablet, Take 1-2 tablets (50-100 mg total) by mouth at bedtime as needed for sleep. 1 hr before bed, Disp: 60 tablet, Rfl: 3 .  vitamin B-12 (CYANOCOBALAMIN) 1000 MCG tablet, Take 1 tablet (1,000 mcg total) by mouth daily., Disp: 90 tablet, Rfl: 3 .  diclofenac sodium (VOLTAREN) 1 % GEL, 2 grams qid to upper body and 4 grams qid to lower body prn, Disp: 100 g, Rfl: 11 .  oxyCODONE-acetaminophen (PERCOCET) 5-325 MG tablet, Take 0.5-1 tablets by mouth 2 (two) times daily as needed for severe pain., Disp: 10 tablet, Rfl: 0  EXAM:  VITALS per  patient if applicable:  GENERAL: alert, oriented, appears well and in no acute distress  HEENT: atraumatic, conjunttiva clear, no obvious abnormalities on inspection of external nose and ears  NECK: normal movements of the head and neck  LUNGS: on inspection no signs of respiratory distress, breathing rate appears normal, no obvious gross SOB, gasping or wheezing  CV: no obvious cyanosis  MS: moves all visible extremities without noticeable abnormality  PSYCH/NEURO: pleasant and cooperative, no obvious depression or anxiety, speech and thought processing grossly intact  ASSESSMENT AND  PLAN:  Discussed the following assessment and plan:  Lumbar radiculopathy with leg weakness, off balance and abnormal gait - Plan: tiZANidine (ZANAFLEX) 4 MG tablet, oxyCODONE-acetaminophen (PERCOCET) 5-325 MG tablet, MR Lumbar Spine Wo Contrast  DDD (degenerative disc disease), cervical - Plan: tiZANidine (ZANAFLEX) 4 MG tablet, oxyCODONE-acetaminophen (PERCOCET) 5-325 MG tablet, MR Lumbar Spine Wo Contrast  Scoliosis of lumbar spine, unspecified scoliosis type - Plan: tiZANidine (ZANAFLEX) 4 MG tablet, oxyCODONE-acetaminophen (PERCOCET) 5-325 MG tablet, MR Lumbar Spine Wo Contrast  Facet arthropathy, lumbar - Plan: tiZANidine (ZANAFLEX) 4 MG tablet, oxyCODONE-acetaminophen (PERCOCET) 5-325 MG tablet, MR Lumbar Spine Wo Contrast -try heat -currently in PT/ot at [facility] with outside company  -consider Dr. Yves Dill referral in the future   Osteoarthritis of both shoulders, unspecified osteoarthritis type - Plan: diclofenac sodium (VOLTAREN) 1 % GEL 2-4 grams qid prn   Hm Flu shot utd Tdapconsider in future unable to find record pna23 utd prevnar had 06/16/15 shingrix 1/2 10/18/19likely will need to repeat series should have had 2nd ose in 11/2018  Consider hep B vaccine  A1C 5.0  colonoscopy had 06/03/11 Dr. Bosie Clos diverticulosis f/u in 10 years  mammo [facility] 04/08/17  negreferred prev pt needs to call and schedule   Out of age window pap last 01/26/10 neg   dexa 04/13/15 osteopenia consider repeat in future3-5 years check vitamin D -referred   hcv neg Lipid had 08/12/17 reviewed CT chest overdue ordered by Dr. Kendrick Fries  Will need dermatology in future$ is issue currently   I discussed the assessment and treatment plan with the patient. The patient was provided an opportunity to ask questions and all were answered. The patient agreed with the plan and demonstrated an understanding of the instructions.   The patient was advised to call back or seek an in-person evaluation if the symptoms worsen or if the condition fails to improve as anticipated.  Time spent 15 minutes  Bevelyn Buckles, MD

## 2019-01-01 NOTE — Telephone Encounter (Signed)
Copied from CRM [id number]. Topic: Quick Communication - Home Health Verbal Orders >> Jan 01, 2019 11:15 AM Valora Piccolo wrote: Caller/Agency: Stephanie[facility] Callback Number: [phone] [facility] to leave verbal on VM Requesting OT/PT/Skilled Nursing/Social Work/Speech Therapy:OT Frequency: 1x1, 2x2, 1x1

## 2019-01-01 NOTE — Telephone Encounter (Signed)
Ok to give verbals   [employer]

## 2019-01-04 ENCOUNTER — Telehealth: Payer: Self-pay | Admitting: Internal Medicine

## 2019-01-04 DIAGNOSIS — M5030 Other cervical disc degeneration, unspecified cervical region: Secondary | ICD-10-CM | POA: Diagnosis not present

## 2019-01-04 DIAGNOSIS — G8929 Other chronic pain: Secondary | ICD-10-CM | POA: Diagnosis not present

## 2019-01-04 DIAGNOSIS — I2510 Atherosclerotic heart disease of native coronary artery without angina pectoris: Secondary | ICD-10-CM | POA: Diagnosis not present

## 2019-01-04 DIAGNOSIS — I129 Hypertensive chronic kidney disease with stage 1 through stage 4 chronic kidney disease, or unspecified chronic kidney disease: Secondary | ICD-10-CM | POA: Diagnosis not present

## 2019-01-04 DIAGNOSIS — N183 Chronic kidney disease, stage 3 (moderate): Secondary | ICD-10-CM | POA: Diagnosis not present

## 2019-01-04 DIAGNOSIS — M19042 Primary osteoarthritis, left hand: Secondary | ICD-10-CM | POA: Diagnosis not present

## 2019-01-04 DIAGNOSIS — M545 Low back pain: Secondary | ICD-10-CM | POA: Diagnosis not present

## 2019-01-04 DIAGNOSIS — M19041 Primary osteoarthritis, right hand: Secondary | ICD-10-CM | POA: Diagnosis not present

## 2019-01-04 DIAGNOSIS — I951 Orthostatic hypotension: Secondary | ICD-10-CM | POA: Diagnosis not present

## 2019-01-04 NOTE — Telephone Encounter (Signed)
Copied from CRM [id number]. Topic: Quick Communication - Home Health Verbal Orders >> Jan 04, 2019  3:33 PM Fanny Bien wrote: Caller/Agency: [facility]  Callback [phone]  Requesting PT  Frequency: 2x4

## 2019-01-04 NOTE — Telephone Encounter (Signed)
Yes ok with PT order  TMS

## 2019-01-04 NOTE — Telephone Encounter (Signed)
Ok with PT order.

## 2019-01-05 NOTE — Telephone Encounter (Signed)
Left detailed message on [place] VM.

## 2019-01-07 ENCOUNTER — Other Ambulatory Visit: Payer: Self-pay | Admitting: Internal Medicine

## 2019-01-07 DIAGNOSIS — M545 Low back pain: Secondary | ICD-10-CM | POA: Diagnosis not present

## 2019-01-07 DIAGNOSIS — G4700 Insomnia, unspecified: Secondary | ICD-10-CM

## 2019-01-07 DIAGNOSIS — I129 Hypertensive chronic kidney disease with stage 1 through stage 4 chronic kidney disease, or unspecified chronic kidney disease: Secondary | ICD-10-CM | POA: Diagnosis not present

## 2019-01-07 DIAGNOSIS — M5030 Other cervical disc degeneration, unspecified cervical region: Secondary | ICD-10-CM | POA: Diagnosis not present

## 2019-01-07 DIAGNOSIS — G8929 Other chronic pain: Secondary | ICD-10-CM | POA: Diagnosis not present

## 2019-01-07 DIAGNOSIS — N183 Chronic kidney disease, stage 3 (moderate): Secondary | ICD-10-CM | POA: Diagnosis not present

## 2019-01-07 DIAGNOSIS — M19041 Primary osteoarthritis, right hand: Secondary | ICD-10-CM | POA: Diagnosis not present

## 2019-01-07 DIAGNOSIS — I951 Orthostatic hypotension: Secondary | ICD-10-CM | POA: Diagnosis not present

## 2019-01-07 DIAGNOSIS — M19042 Primary osteoarthritis, left hand: Secondary | ICD-10-CM | POA: Diagnosis not present

## 2019-01-07 DIAGNOSIS — F419 Anxiety disorder, unspecified: Secondary | ICD-10-CM

## 2019-01-07 DIAGNOSIS — I2510 Atherosclerotic heart disease of native coronary artery without angina pectoris: Secondary | ICD-10-CM | POA: Diagnosis not present

## 2019-01-07 MED ORDER — LORAZEPAM 0.5 MG PO TABS: 0.5000 mg | ORAL_TABLET | Freq: Two times a day (BID) | ORAL | 5 refills | Status: DC | PRN

## 2019-01-09 DIAGNOSIS — M19041 Primary osteoarthritis, right hand: Secondary | ICD-10-CM | POA: Diagnosis not present

## 2019-01-09 DIAGNOSIS — I2510 Atherosclerotic heart disease of native coronary artery without angina pectoris: Secondary | ICD-10-CM | POA: Diagnosis not present

## 2019-01-09 DIAGNOSIS — N183 Chronic kidney disease, stage 3 (moderate): Secondary | ICD-10-CM | POA: Diagnosis not present

## 2019-01-09 DIAGNOSIS — I129 Hypertensive chronic kidney disease with stage 1 through stage 4 chronic kidney disease, or unspecified chronic kidney disease: Secondary | ICD-10-CM | POA: Diagnosis not present

## 2019-01-09 DIAGNOSIS — M19042 Primary osteoarthritis, left hand: Secondary | ICD-10-CM | POA: Diagnosis not present

## 2019-01-09 DIAGNOSIS — G8929 Other chronic pain: Secondary | ICD-10-CM | POA: Diagnosis not present

## 2019-01-09 DIAGNOSIS — M545 Low back pain: Secondary | ICD-10-CM | POA: Diagnosis not present

## 2019-01-09 DIAGNOSIS — I951 Orthostatic hypotension: Secondary | ICD-10-CM | POA: Diagnosis not present

## 2019-01-09 DIAGNOSIS — M5030 Other cervical disc degeneration, unspecified cervical region: Secondary | ICD-10-CM | POA: Diagnosis not present

## 2019-01-11 DIAGNOSIS — I2510 Atherosclerotic heart disease of native coronary artery without angina pectoris: Secondary | ICD-10-CM | POA: Diagnosis not present

## 2019-01-11 DIAGNOSIS — M19041 Primary osteoarthritis, right hand: Secondary | ICD-10-CM | POA: Diagnosis not present

## 2019-01-11 DIAGNOSIS — M545 Low back pain: Secondary | ICD-10-CM | POA: Diagnosis not present

## 2019-01-11 DIAGNOSIS — N183 Chronic kidney disease, stage 3 (moderate): Secondary | ICD-10-CM | POA: Diagnosis not present

## 2019-01-11 DIAGNOSIS — M5030 Other cervical disc degeneration, unspecified cervical region: Secondary | ICD-10-CM | POA: Diagnosis not present

## 2019-01-11 DIAGNOSIS — G8929 Other chronic pain: Secondary | ICD-10-CM | POA: Diagnosis not present

## 2019-01-11 DIAGNOSIS — I129 Hypertensive chronic kidney disease with stage 1 through stage 4 chronic kidney disease, or unspecified chronic kidney disease: Secondary | ICD-10-CM | POA: Diagnosis not present

## 2019-01-11 DIAGNOSIS — I951 Orthostatic hypotension: Secondary | ICD-10-CM | POA: Diagnosis not present

## 2019-01-11 DIAGNOSIS — M19042 Primary osteoarthritis, left hand: Secondary | ICD-10-CM | POA: Diagnosis not present

## 2019-01-13 DIAGNOSIS — G8929 Other chronic pain: Secondary | ICD-10-CM | POA: Diagnosis not present

## 2019-01-13 DIAGNOSIS — M19042 Primary osteoarthritis, left hand: Secondary | ICD-10-CM | POA: Diagnosis not present

## 2019-01-13 DIAGNOSIS — M545 Low back pain: Secondary | ICD-10-CM | POA: Diagnosis not present

## 2019-01-13 DIAGNOSIS — I129 Hypertensive chronic kidney disease with stage 1 through stage 4 chronic kidney disease, or unspecified chronic kidney disease: Secondary | ICD-10-CM | POA: Diagnosis not present

## 2019-01-13 DIAGNOSIS — M5030 Other cervical disc degeneration, unspecified cervical region: Secondary | ICD-10-CM | POA: Diagnosis not present

## 2019-01-13 DIAGNOSIS — I951 Orthostatic hypotension: Secondary | ICD-10-CM | POA: Diagnosis not present

## 2019-01-13 DIAGNOSIS — M19041 Primary osteoarthritis, right hand: Secondary | ICD-10-CM | POA: Diagnosis not present

## 2019-01-13 DIAGNOSIS — I2510 Atherosclerotic heart disease of native coronary artery without angina pectoris: Secondary | ICD-10-CM | POA: Diagnosis not present

## 2019-01-13 DIAGNOSIS — N183 Chronic kidney disease, stage 3 (moderate): Secondary | ICD-10-CM | POA: Diagnosis not present

## 2019-01-15 ENCOUNTER — Ambulatory Visit
Admission: RE | Admit: 2019-01-15 | Discharge: 2019-01-15 | Disposition: A | Discharge status: Home / Self Care | Payer: Medicare HMO | Source: Ambulatory Visit | Attending: Internal Medicine | Admitting: Internal Medicine

## 2019-01-15 ENCOUNTER — Other Ambulatory Visit: Payer: Self-pay

## 2019-01-15 DIAGNOSIS — M5136 Other intervertebral disc degeneration, lumbar region: Secondary | ICD-10-CM

## 2019-01-15 DIAGNOSIS — M5030 Other cervical disc degeneration, unspecified cervical region: Secondary | ICD-10-CM | POA: Diagnosis not present

## 2019-01-15 DIAGNOSIS — M19042 Primary osteoarthritis, left hand: Secondary | ICD-10-CM | POA: Diagnosis not present

## 2019-01-15 DIAGNOSIS — M419 Scoliosis, unspecified: Secondary | ICD-10-CM | POA: Diagnosis not present

## 2019-01-15 DIAGNOSIS — M545 Low back pain: Secondary | ICD-10-CM | POA: Diagnosis not present

## 2019-01-15 DIAGNOSIS — M5416 Radiculopathy, lumbar region: Secondary | ICD-10-CM

## 2019-01-15 DIAGNOSIS — G8929 Other chronic pain: Secondary | ICD-10-CM | POA: Diagnosis not present

## 2019-01-15 DIAGNOSIS — I129 Hypertensive chronic kidney disease with stage 1 through stage 4 chronic kidney disease, or unspecified chronic kidney disease: Secondary | ICD-10-CM | POA: Diagnosis not present

## 2019-01-15 DIAGNOSIS — I2510 Atherosclerotic heart disease of native coronary artery without angina pectoris: Secondary | ICD-10-CM | POA: Diagnosis not present

## 2019-01-15 DIAGNOSIS — M47816 Spondylosis without myelopathy or radiculopathy, lumbar region: Secondary | ICD-10-CM | POA: Diagnosis not present

## 2019-01-15 DIAGNOSIS — N183 Chronic kidney disease, stage 3 (moderate): Secondary | ICD-10-CM | POA: Diagnosis not present

## 2019-01-15 DIAGNOSIS — M19041 Primary osteoarthritis, right hand: Secondary | ICD-10-CM | POA: Diagnosis not present

## 2019-01-15 DIAGNOSIS — I951 Orthostatic hypotension: Secondary | ICD-10-CM | POA: Diagnosis not present

## 2019-01-18 ENCOUNTER — Other Ambulatory Visit: Payer: Self-pay | Admitting: Internal Medicine

## 2019-01-18 DIAGNOSIS — M19041 Primary osteoarthritis, right hand: Secondary | ICD-10-CM | POA: Diagnosis not present

## 2019-01-18 DIAGNOSIS — I951 Orthostatic hypotension: Secondary | ICD-10-CM | POA: Diagnosis not present

## 2019-01-18 DIAGNOSIS — M19042 Primary osteoarthritis, left hand: Secondary | ICD-10-CM | POA: Diagnosis not present

## 2019-01-18 DIAGNOSIS — N281 Cyst of kidney, acquired: Secondary | ICD-10-CM

## 2019-01-18 DIAGNOSIS — I2510 Atherosclerotic heart disease of native coronary artery without angina pectoris: Secondary | ICD-10-CM | POA: Diagnosis not present

## 2019-01-18 DIAGNOSIS — N183 Chronic kidney disease, stage 3 (moderate): Secondary | ICD-10-CM | POA: Diagnosis not present

## 2019-01-18 DIAGNOSIS — M545 Low back pain: Secondary | ICD-10-CM | POA: Diagnosis not present

## 2019-01-18 DIAGNOSIS — M5030 Other cervical disc degeneration, unspecified cervical region: Secondary | ICD-10-CM | POA: Diagnosis not present

## 2019-01-18 DIAGNOSIS — I129 Hypertensive chronic kidney disease with stage 1 through stage 4 chronic kidney disease, or unspecified chronic kidney disease: Secondary | ICD-10-CM | POA: Diagnosis not present

## 2019-01-18 DIAGNOSIS — M5416 Radiculopathy, lumbar region: Secondary | ICD-10-CM

## 2019-01-18 DIAGNOSIS — G8929 Other chronic pain: Secondary | ICD-10-CM | POA: Diagnosis not present

## 2019-01-20 ENCOUNTER — Telehealth: Payer: Self-pay

## 2019-01-20 NOTE — Telephone Encounter (Signed)
Copied from [place] [id number]. Topic: Referral - Question >> Jan 20, 2019 10:02 AM Nathanial Millman J wrote: Reason for CRM: pt is returning call about mri reults of her back,  she was shook up about bad news and did not understand all the information.  She is asking for a call back about this, and also about her US renal and referral Please call [phone]

## 2019-01-20 NOTE — Telephone Encounter (Signed)
Copied from CRM #[id number]. Topic: Referral - Question >> Jan 20, 2019 10:02 AM [place], Melissa J wrote: Reason for CRM: pt is returning call about mri reults of her back,  she was shook up about bad news and did not understand all the information.  She is asking for a call back about this, and also about her us renal and referral Please call [phone] 

## 2019-01-21 ENCOUNTER — Telehealth: Payer: Self-pay

## 2019-01-21 NOTE — Telephone Encounter (Signed)
Spoken to patient, and explained results again

## 2019-01-21 NOTE — Telephone Encounter (Signed)
Copied from CRM #[id number]. Topic: Referral - Question >> Jan 20, 2019 10:02 AM [place], Melissa J wrote: Reason for CRM: pt is returning call about mri reults of her back,  she was shook up about bad news and did not understand all the information.  She is asking for a call back about this, and also about her us renal and referral Please call [phone] 

## 2019-01-22 ENCOUNTER — Other Ambulatory Visit: Payer: Self-pay

## 2019-01-22 ENCOUNTER — Ambulatory Visit
Admission: RE | Admit: 2019-01-22 | Discharge: 2019-01-22 | Disposition: A | Discharge status: Home / Self Care | Payer: Medicare HMO | Source: Ambulatory Visit | Attending: Internal Medicine | Admitting: Internal Medicine

## 2019-01-22 DIAGNOSIS — N281 Cyst of kidney, acquired: Secondary | ICD-10-CM

## 2019-01-26 ENCOUNTER — Telehealth: Payer: Self-pay

## 2019-01-26 NOTE — Telephone Encounter (Signed)
Copied from [place] [id number]. Topic: General - Other >> Jan 26, 2019 12:14 PM Oneta Rack wrote: Patient inquiring about US Renal results, please advise

## 2019-01-28 DIAGNOSIS — N183 Chronic kidney disease, stage 3 (moderate): Secondary | ICD-10-CM | POA: Diagnosis not present

## 2019-01-28 DIAGNOSIS — I129 Hypertensive chronic kidney disease with stage 1 through stage 4 chronic kidney disease, or unspecified chronic kidney disease: Secondary | ICD-10-CM | POA: Diagnosis not present

## 2019-01-28 DIAGNOSIS — M545 Low back pain: Secondary | ICD-10-CM | POA: Diagnosis not present

## 2019-01-28 DIAGNOSIS — M5030 Other cervical disc degeneration, unspecified cervical region: Secondary | ICD-10-CM | POA: Diagnosis not present

## 2019-01-28 DIAGNOSIS — I2510 Atherosclerotic heart disease of native coronary artery without angina pectoris: Secondary | ICD-10-CM | POA: Diagnosis not present

## 2019-01-28 DIAGNOSIS — I951 Orthostatic hypotension: Secondary | ICD-10-CM | POA: Diagnosis not present

## 2019-01-28 DIAGNOSIS — M19042 Primary osteoarthritis, left hand: Secondary | ICD-10-CM | POA: Diagnosis not present

## 2019-01-28 DIAGNOSIS — M19041 Primary osteoarthritis, right hand: Secondary | ICD-10-CM | POA: Diagnosis not present

## 2019-01-28 DIAGNOSIS — G8929 Other chronic pain: Secondary | ICD-10-CM | POA: Diagnosis not present

## 2019-01-29 DIAGNOSIS — M47816 Spondylosis without myelopathy or radiculopathy, lumbar region: Secondary | ICD-10-CM | POA: Diagnosis not present

## 2019-01-29 DIAGNOSIS — M5136 Other intervertebral disc degeneration, lumbar region: Secondary | ICD-10-CM | POA: Diagnosis not present

## 2019-02-01 ENCOUNTER — Telehealth: Payer: Self-pay | Admitting: Internal Medicine

## 2019-02-01 NOTE — Telephone Encounter (Signed)
Medication Refill - Medication: Reclining Chair with a lift  Has the patient contacted their pharmacy? No. (Agent: If no, request that the patient contact the pharmacy for the refill.)   Her family wants one from her insurance because her health is declining.   Agent: Please be advised that RX refills may take up to 3 business days. We ask that you follow-up with your pharmacy.

## 2019-02-02 NOTE — Telephone Encounter (Signed)
Ready in box will need to mail or pick up and take to medical supply store or [facility] for DME  [place]

## 2019-02-03 NOTE — Telephone Encounter (Signed)
RX has been mailed.

## 2019-02-09 ENCOUNTER — Other Ambulatory Visit: Payer: Self-pay | Admitting: Internal Medicine

## 2019-02-09 DIAGNOSIS — J309 Allergic rhinitis, unspecified: Secondary | ICD-10-CM

## 2019-02-09 MED ORDER — MONTELUKAST SODIUM 10 MG PO TABS: 10.0000 mg | ORAL_TABLET | Freq: Every day | ORAL | 3 refills | Status: DC

## 2019-02-11 ENCOUNTER — Telehealth: Payer: Self-pay | Admitting: Internal Medicine

## 2019-02-11 NOTE — Telephone Encounter (Signed)
What do I need to do to get PA for medical necessity for recliner ?   [place]

## 2019-02-11 NOTE — Telephone Encounter (Signed)
FYI

## 2019-02-11 NOTE — Telephone Encounter (Signed)
Patient is calling because Dr. Olivia Mackie wrote a script for a recliner with the lift.   [facility] will not accept the prescription. Requesting a PA for medical necessity Please advise.  [phone]

## 2019-02-16 NOTE — Telephone Encounter (Signed)
I have notified Patient to contact insurance since she has already paid out of pocket for chair, for reimbursement paper work or to let me know what they require. Normally when DME order is given for lift chair it should be faxed with DX code and OV notes from [place] to the company of patient choice  With demographics and insurance information and usually the lift company will take care of the rest.  Patient will call back and let me know what we need to do.

## 2019-02-18 ENCOUNTER — Telehealth: Payer: Self-pay | Admitting: Internal Medicine

## 2019-02-18 ENCOUNTER — Ambulatory Visit (INDEPENDENT_AMBULATORY_CARE_PROVIDER_SITE_OTHER): Payer: Medicare HMO | Admitting: Internal Medicine

## 2019-02-18 ENCOUNTER — Encounter: Payer: Self-pay | Admitting: Internal Medicine

## 2019-02-18 ENCOUNTER — Other Ambulatory Visit: Payer: Self-pay

## 2019-02-18 VITALS — Ht 63.5 in | Wt 161.0 lb

## 2019-02-18 DIAGNOSIS — N390 Urinary tract infection, site not specified: Secondary | ICD-10-CM

## 2019-02-18 DIAGNOSIS — M5442 Lumbago with sciatica, left side: Secondary | ICD-10-CM

## 2019-02-18 DIAGNOSIS — N3000 Acute cystitis without hematuria: Secondary | ICD-10-CM | POA: Diagnosis not present

## 2019-02-18 DIAGNOSIS — G8929 Other chronic pain: Secondary | ICD-10-CM | POA: Diagnosis not present

## 2019-02-18 DIAGNOSIS — R937 Abnormal findings on diagnostic imaging of other parts of musculoskeletal system: Secondary | ICD-10-CM | POA: Diagnosis not present

## 2019-02-18 DIAGNOSIS — M549 Dorsalgia, unspecified: Secondary | ICD-10-CM | POA: Insufficient documentation

## 2019-02-18 DIAGNOSIS — R269 Unspecified abnormalities of gait and mobility: Secondary | ICD-10-CM | POA: Insufficient documentation

## 2019-02-18 DIAGNOSIS — R9389 Abnormal findings on diagnostic imaging of other specified body structures: Secondary | ICD-10-CM

## 2019-02-18 MED ORDER — CIPROFLOXACIN HCL 500 MG PO TABS: 500.0000 mg | ORAL_TABLET | Freq: Two times a day (BID) | ORAL | 0 refills | Status: DC

## 2019-02-18 NOTE — Progress Notes (Signed)
Telephone Note  I connected with Kimberly Cook   on 02/18/19 at  9:40 AM EDT by a telephone and verified that I am speaking with the correct person using two identifiers.  Location patient: home Location provider:work  Persons participating in the virtual visit: patient, provider  I discussed the limitations of evaluation and management by telemedicine and the availability of in person appointments. The patient expressed understanding and agreed to proceed.   HPI: 1. Acute on chronic low back pain abnormal MRI tramadol 50 mg bid is not helping appt 02/23/19 for nerve block x 2 with Dr. Yves Dill and PT/OT has been halted for now. She is having low back pain radiating to left hip and leg and throbbing 10/10 most days affecting ADLS and IADLS and she has problems with mobility unable to walk her dog nicolas and is trying to getting out of bed difficult   Due to this OT/PT rec recliner lift chair and she already has Rx fo rthis and paid $852.94 and would like to be reimbursed by her [employer] insurnace   IMPRESSION: 1. Lumbar spondylosis, scoliosis, and degenerative disc disease, resulting in prominent impingement at L4-5; moderate impingement at L2-3, L3-4, and L5-S1; and mild impingement at L1-2, as detailed Above.  2. C/o frequency urination at night, and feeling like unable to empty and stinging with h/o UTI   3. HTN today 133/75 on meds pulse 85 and yesterday when in pani BP 167/100 and she had to take hydralazine 20 mg 2x per day prn yesterday to help   ROS: See pertinent positives and negatives per HPI.  Past Medical History:  Diagnosis Date  . Allergy   . Arthritis   . Coronary artery disease 04/2017   Mild to moderate CAD in LAD/diagonal by CTA (CT-FFR of apical LAD 0.79).  . Depression   . Diverticulosis   . Essential hypertension    Normal cardiolite 05/2006 EF 71%  . GERD (gastroesophageal reflux disease)   . Headache   . History of shingles   . Hyperlipidemia   .  Hypothyroidism   . Mini stroke (HCC) 2011   . Occipital neuralgia   . Prediabetes   . Stroke (HCC)   . Stroke [facility])    MRI 04/2008 + left sup. frontal gyrus possibly puntate infarct   . Urinary tract infection   . Vitamin D deficiency     Past Surgical History:  Procedure Laterality Date  . ABDOMINAL HYSTERECTOMY    . BLADDER SURGERY     2003  . BREAST BIOPSY Right Over 20 years    Benign  . CHOLECYSTECTOMY    . gastroplication     . JOINT REPLACEMENT Left   . KNEE ARTHROSCOPY Left 2011  . TONSILLECTOMY AND ADENOIDECTOMY    . TOTAL KNEE ARTHROPLASTY Left 04/17/2015   Procedure: LEFT TOTAL KNEE ARTHROPLASTY;  Surgeon: Durene Romans, MD;  Location: [facility];  Service: Orthopedics;  Laterality: Left;    Family History  Problem Relation Age of Onset  . Heart disease Mother   . Hypertension Mother   . Diabetes Mother   . Heart attack Mother 26  . Heart disease Father   . Heart attack Father 60  . Breast cancer Maternal Aunt   . Heart attack Brother     SOCIAL HX:  Widowed  [place]  Has kids and grandson    Current Outpatient Medications:  .  aspirin EC 81 MG tablet, Take 1 tablet (81 mg total) by mouth daily.,  Disp: 90 tablet, Rfl: 3 .  atorvastatin (LIPITOR) 20 MG tablet, Take 1 tablet (20 mg total) by mouth daily., Disp: 90 tablet, Rfl: 3 .  Biotin 1 MG CAPS, Take 1 mg by mouth daily. , Disp: , Rfl:  .  cetirizine (ZYRTEC) 5 MG tablet, Take 1-2 tablets (5-10 mg total) by mouth at bedtime as needed for allergies., Disp: 90 tablet, Rfl: 3 .  Cholecalciferol (VITAMIN D3) 125 MCG (5000 UT) TABS, Take 1 tablet (5,000 Units total) by mouth daily., Disp: 90 tablet, Rfl: 3 .  diclofenac sodium (VOLTAREN) 1 % GEL, 2 grams qid to upper body and 4 grams qid to lower body prn, Disp: 100 g, Rfl: 11 .  diltiazem (CARDIZEM SR) 60 MG 12 hr capsule, Take 1 capsule (60 mg total) by mouth 2 (two) times daily., Disp: 180 capsule, Rfl: 3 .  escitalopram (LEXAPRO) 5 MG tablet, Take 1  tablet (5 mg total) by mouth daily., Disp: 90 tablet, Rfl: 3 .  fluticasone (FLONASE) 50 MCG/ACT nasal spray, Place 2 sprays into both nostrils daily as needed for allergies or rhinitis. Use after nasal saline, Disp: 16 g, Rfl: 12 .  folic acid (FOLVITE) 419 MCG tablet, Take 1 tablet (400 mcg total) by mouth daily. SEPARATE ALL SUPPLEMENTS TO LUNCH OR DINNER AND PRILOSEC NOT TO MESS W/THYROID MED, Disp: 90 tablet, Rfl: 3 .  hydrALAZINE (APRESOLINE) 10 MG tablet, Take 2 tablets (20 mg total) by mouth 2 (two) times daily as needed. BP>140/>90, Disp: 180 tablet, Rfl: 3 .  hydrochlorothiazide (MICROZIDE) 12.5 MG capsule, Take 1 capsule (12.5 mg total) by mouth daily. In am, Disp: 90 capsule, Rfl: 3 .  levothyroxine (SYNTHROID, LEVOTHROID) 88 MCG tablet, Take 1 tablet (88 mcg total) by mouth daily before breakfast. Skip sundays, Disp: 90 tablet, Rfl: 3 .  LORazepam (ATIVAN) 0.5 MG tablet, Take 1 tablet (0.5 mg total) by mouth 2 (two) times daily as needed for anxiety or sleep. Or 1 mg at night for sleep, Disp: 60 tablet, Rfl: 5 .  montelukast (SINGULAIR) 10 MG tablet, Take 1 tablet (10 mg total) by mouth at bedtime., Disp: 90 tablet, Rfl: 3 .  mupirocin ointment (BACTROBAN) 2 %, Apply 1 application topically 2 (two) times daily. Right lower leg, Disp: 30 g, Rfl: 0 .  omeprazole (PRILOSEC) 40 MG capsule, Take 1 capsule (40 mg total) by mouth daily., Disp: 90 capsule, Rfl: 3 .  oxyCODONE-acetaminophen (PERCOCET) 5-325 MG tablet, Take 0.5-1 tablets by mouth 2 (two) times daily as needed for severe pain., Disp: 10 tablet, Rfl: 0 .  sodium chloride (OCEAN) 0.65 % SOLN nasal spray, Place 2 sprays into both nostrils as needed for congestion. Use 1st, Disp: 1 Bottle, Rfl: 12 .  tiZANidine (ZANAFLEX) 4 MG tablet, Take 1 tablet (4 mg total) by mouth 2 (two) times daily as needed for muscle spasms., Disp: 60 tablet, Rfl: 5 .  traZODone (DESYREL) 50 MG tablet, Take 1-2 tablets (50-100 mg total) by mouth at bedtime as  needed for sleep. 1 hr before bed, Disp: 60 tablet, Rfl: 3 .  vitamin B-12 (CYANOCOBALAMIN) 1000 MCG tablet, Take 1 tablet (1,000 mcg total) by mouth daily., Disp: 90 tablet, Rfl: 3 .  ciprofloxacin (CIPRO) 500 MG tablet, Take 1 tablet (500 mg total) by mouth 2 (two) times daily. Food, Disp: 10 tablet, Rfl: 0  EXAM:  VITALS per patient if applicable:  GENERAL: alert, oriented, appears well and in no acute distress  MS: abnormal gait + due to  chronic back pain   PSYCH/NEURO: pleasant and cooperative, no obvious depression or anxiety, speech and thought processing grossly intact  ASSESSMENT AND PLAN:  Discussed the following assessment and plan:  Abnormal MRI, lumbar spine - Plan  Chronic left-sided low back pain with left-sided sciatica  -block x 2 sch 02/23/19  Hold PT/OT for now  Rx recliner lift chair  Called [employer] pt needs to call member services [phone] reference # [id number]  Acute cystitis without hematuria - Plan: Urinalysis, Routine w reflex microscopic, Urine Culture  Cipro 500 mg bid x5  rec urology if continues   HM Flu shot utd Tdapconsider in future unable to find record pna23 utd prevnar had 06/16/15 shingrix 1/2 10/18/19likely will need to repeat series should have had 2nd ose in 11/2018  Consider hep B vaccine  A1C 5.0  colonoscopy had 06/03/11 Dr. Bosie ClosSchooler diverticulosis f/u in 10 years  mammo [facility] 04/08/17 negreferredprev pt needs to call and schedule order is in   Out of age window pap last 01/26/10 neg   dexa 04/13/15 osteopenia consider repeat in future3-5 years check vitamin D -referred  hcv neg Lipid had 08/12/17 reviewed CT chest overdue ordered by Dr. Kendrick FriesMcQuaid  Will need dermatology in future$ is issue currently    I discussed the assessment and treatment plan with the patient. The patient was provided an opportunity to ask questions and all were answered. The patient agreed with the plan and demonstrated an  understanding of the instructions.   The patient was advised to call back or seek an in-person evaluation if the symptoms worsen or if the condition fails to improve as anticipated.  Time spent 25 minutes  Bevelyn Bucklesracy N McLean-Scocuzza, MD

## 2019-02-18 NOTE — Telephone Encounter (Signed)
Call pt and see if can come into clinic for urine test today before 12 pm   [place]

## 2019-02-18 NOTE — Telephone Encounter (Signed)
Thank you   TMS 

## 2019-02-18 NOTE — Telephone Encounter (Signed)
Inform pt   Called [employer] pt needs to call member services [phone] reference # [phone]

## 2019-02-19 ENCOUNTER — Other Ambulatory Visit: Payer: Self-pay

## 2019-02-19 ENCOUNTER — Other Ambulatory Visit (INDEPENDENT_AMBULATORY_CARE_PROVIDER_SITE_OTHER): Payer: Medicare HMO

## 2019-02-19 DIAGNOSIS — N3000 Acute cystitis without hematuria: Secondary | ICD-10-CM

## 2019-02-21 LAB — URINE CULTURE
MICRO NUMBER:: 678577
SPECIMEN QUALITY:: ADEQUATE

## 2019-02-21 LAB — URINALYSIS, ROUTINE W REFLEX MICROSCOPIC
Bilirubin Urine: NEGATIVE
Glucose, UA: NEGATIVE
Hyaline Cast: NONE SEEN /LPF
Ketones, ur: NEGATIVE
Nitrite: POSITIVE — AB
Specific Gravity, Urine: 1.018 (ref 1.001–1.03)
pH: <=5 (ref 5.0–8.0)

## 2019-02-23 ENCOUNTER — Encounter: Payer: Self-pay | Admitting: Internal Medicine

## 2019-02-23 ENCOUNTER — Other Ambulatory Visit: Payer: Self-pay | Admitting: Internal Medicine

## 2019-02-23 DIAGNOSIS — Z9889 Other specified postprocedural states: Secondary | ICD-10-CM

## 2019-02-23 DIAGNOSIS — N390 Urinary tract infection, site not specified: Secondary | ICD-10-CM

## 2019-02-23 DIAGNOSIS — M47816 Spondylosis without myelopathy or radiculopathy, lumbar region: Secondary | ICD-10-CM | POA: Diagnosis not present

## 2019-03-10 DIAGNOSIS — M47816 Spondylosis without myelopathy or radiculopathy, lumbar region: Secondary | ICD-10-CM | POA: Diagnosis not present

## 2019-03-17 DIAGNOSIS — M5136 Other intervertebral disc degeneration, lumbar region: Secondary | ICD-10-CM | POA: Diagnosis not present

## 2019-03-17 DIAGNOSIS — M47816 Spondylosis without myelopathy or radiculopathy, lumbar region: Secondary | ICD-10-CM | POA: Diagnosis not present

## 2019-03-30 ENCOUNTER — Other Ambulatory Visit: Payer: Medicare HMO

## 2019-04-07 DIAGNOSIS — M47816 Spondylosis without myelopathy or radiculopathy, lumbar region: Secondary | ICD-10-CM | POA: Diagnosis not present

## 2019-04-14 ENCOUNTER — Ambulatory Visit
Admission: RE | Admit: 2019-04-14 | Discharge: 2019-04-14 | Disposition: A | Discharge status: Home / Self Care | Payer: Medicare HMO | Source: Ambulatory Visit | Attending: Internal Medicine | Admitting: Internal Medicine

## 2019-04-14 DIAGNOSIS — Z1231 Encounter for screening mammogram for malignant neoplasm of breast: Secondary | ICD-10-CM | POA: Diagnosis not present

## 2019-04-14 DIAGNOSIS — Z780 Asymptomatic menopausal state: Secondary | ICD-10-CM | POA: Diagnosis not present

## 2019-04-14 DIAGNOSIS — E2839 Other primary ovarian failure: Secondary | ICD-10-CM

## 2019-04-14 DIAGNOSIS — M8589 Other specified disorders of bone density and structure, multiple sites: Secondary | ICD-10-CM | POA: Diagnosis not present

## 2019-04-29 ENCOUNTER — Ambulatory Visit: Payer: Self-pay | Admitting: *Deleted

## 2019-04-29 ENCOUNTER — Emergency Department: Payer: Medicare HMO

## 2019-04-29 ENCOUNTER — Other Ambulatory Visit: Payer: Self-pay

## 2019-04-29 ENCOUNTER — Telehealth: Payer: Self-pay | Admitting: Internal Medicine

## 2019-04-29 ENCOUNTER — Emergency Department
Admission: EM | Admit: 2019-04-29 | Discharge: 2019-04-29 | Disposition: A | Discharge status: Home / Self Care | Payer: Medicare HMO | Attending: Emergency Medicine | Admitting: Emergency Medicine

## 2019-04-29 DIAGNOSIS — M479 Spondylosis, unspecified: Secondary | ICD-10-CM | POA: Diagnosis not present

## 2019-04-29 DIAGNOSIS — M4306 Spondylolysis, lumbar region: Secondary | ICD-10-CM | POA: Diagnosis not present

## 2019-04-29 DIAGNOSIS — E039 Hypothyroidism, unspecified: Secondary | ICD-10-CM | POA: Insufficient documentation

## 2019-04-29 DIAGNOSIS — Z96652 Presence of left artificial knee joint: Secondary | ICD-10-CM | POA: Insufficient documentation

## 2019-04-29 DIAGNOSIS — Z79899 Other long term (current) drug therapy: Secondary | ICD-10-CM | POA: Diagnosis not present

## 2019-04-29 DIAGNOSIS — Y999 Unspecified external cause status: Secondary | ICD-10-CM | POA: Insufficient documentation

## 2019-04-29 DIAGNOSIS — Y92009 Unspecified place in unspecified non-institutional (private) residence as the place of occurrence of the external cause: Secondary | ICD-10-CM | POA: Insufficient documentation

## 2019-04-29 DIAGNOSIS — M48061 Spinal stenosis, lumbar region without neurogenic claudication: Secondary | ICD-10-CM | POA: Diagnosis not present

## 2019-04-29 DIAGNOSIS — Z7982 Long term (current) use of aspirin: Secondary | ICD-10-CM | POA: Insufficient documentation

## 2019-04-29 DIAGNOSIS — Y9389 Activity, other specified: Secondary | ICD-10-CM | POA: Insufficient documentation

## 2019-04-29 DIAGNOSIS — N183 Chronic kidney disease, stage 3 (moderate): Secondary | ICD-10-CM | POA: Diagnosis not present

## 2019-04-29 DIAGNOSIS — I129 Hypertensive chronic kidney disease with stage 1 through stage 4 chronic kidney disease, or unspecified chronic kidney disease: Secondary | ICD-10-CM | POA: Diagnosis not present

## 2019-04-29 DIAGNOSIS — X500XXA Overexertion from strenuous movement or load, initial encounter: Secondary | ICD-10-CM | POA: Diagnosis not present

## 2019-04-29 DIAGNOSIS — M545 Low back pain, unspecified: Secondary | ICD-10-CM

## 2019-04-29 DIAGNOSIS — Z7722 Contact with and (suspected) exposure to environmental tobacco smoke (acute) (chronic): Secondary | ICD-10-CM | POA: Diagnosis not present

## 2019-04-29 DIAGNOSIS — M47816 Spondylosis without myelopathy or radiculopathy, lumbar region: Secondary | ICD-10-CM | POA: Diagnosis not present

## 2019-04-29 DIAGNOSIS — I2510 Atherosclerotic heart disease of native coronary artery without angina pectoris: Secondary | ICD-10-CM | POA: Insufficient documentation

## 2019-04-29 DIAGNOSIS — M5127 Other intervertebral disc displacement, lumbosacral region: Secondary | ICD-10-CM | POA: Diagnosis not present

## 2019-04-29 DIAGNOSIS — G8929 Other chronic pain: Secondary | ICD-10-CM

## 2019-04-29 DIAGNOSIS — R202 Paresthesia of skin: Secondary | ICD-10-CM | POA: Diagnosis not present

## 2019-04-29 DIAGNOSIS — N3090 Cystitis, unspecified without hematuria: Secondary | ICD-10-CM | POA: Diagnosis not present

## 2019-04-29 LAB — URINALYSIS, COMPLETE (UACMP) WITH MICROSCOPIC
Bilirubin Urine: NEGATIVE
Glucose, UA: NEGATIVE mg/dL
Hgb urine dipstick: NEGATIVE
Ketones, ur: NEGATIVE mg/dL
Nitrite: POSITIVE — AB
Protein, ur: NEGATIVE mg/dL
Specific Gravity, Urine: 1.009 (ref 1.005–1.030)
WBC, UA: >50 WBC/hpf — ABNORMAL HIGH (ref 0–5)
pH: 6 (ref 5.0–8.0)

## 2019-04-29 MED ORDER — GABAPENTIN 100 MG PO CAPS
100.0000 mg | ORAL_CAPSULE | ORAL | Status: AC
  Administered 2019-04-29: 100 mg via ORAL

## 2019-04-29 MED ORDER — GABAPENTIN 100 MG PO CAPS: 100.0000 mg | ORAL_CAPSULE | Freq: Three times a day (TID) | ORAL | 0 refills | Status: DC

## 2019-04-29 MED ORDER — MORPHINE SULFATE (PF) 4 MG/ML IV SOLN
4.0000 mg | Freq: Once | INTRAVENOUS | Status: AC
  Administered 2019-04-29: 4 mg via INTRAVENOUS
  Filled 2019-04-29: qty 1

## 2019-04-29 MED ORDER — CEPHALEXIN 500 MG PO CAPS
500.0000 mg | ORAL_CAPSULE | Freq: Once | ORAL | Status: AC
  Administered 2019-04-29: 22:00:00 500 mg via ORAL

## 2019-04-29 MED ORDER — CEPHALEXIN 500 MG PO CAPS: 500.0000 mg | ORAL_CAPSULE | Freq: Two times a day (BID) | ORAL | 0 refills | Status: DC

## 2019-04-29 MED ORDER — LORAZEPAM 1 MG PO TABS: 1.0000 mg | ORAL_TABLET | Freq: Once | ORAL | Status: DC | PRN

## 2019-04-29 NOTE — Telephone Encounter (Signed)
Pt called stating her whole body is  twitching, from her back down and arms. Her hands are shaking. Her toes are hurting. She is unable to walk. Has a bad headache and a little nauseated. Her breathing, she states is heavier than normal. Has not started on any new medication.  She is very anxious. A friend is with her now and now has called 911 to assess her. Routing to LB at [facility] for review.   Reason for Disposition . [1] SEVERE weakness (i.e., unable to walk or barely able to walk, requires support) AND [2] new onset or worsening  Answer Assessment - Initial Assessment Questions 1. SYMPTOM: "What is the main symptom you are concerned about?" (e.g., weakness, numbness)     twitching 2. ONSET: "When did this start?" (minutes, hours, days; while sleeping)     today 3. LAST NORMAL: "When was the last time you were normal (no symptoms)?"     See #2 4. PATTERN "Does this come and go, or has it been constant since it started?"  "Is it present now?"     constant 5. CARDIAC SYMPTOMS: "Have you had any of the following symptoms: chest pain, difficulty breathing, palpitations?"     no 6. NEUROLOGIC SYMPTOMS: "Have you had any of the following symptoms: headache, dizziness, vision loss, double vision, changes in speech, unsteady on your feet?"     Heavier breathing, unsteady on feet 7. OTHER SYMPTOMS: "Do you have any other symptoms?"     Toes are hurting 8. PREGNANCY: "Is there any chance you are pregnant?" "When was your last menstrual period?"     n/a  Protocols used: NEUROLOGIC DEFICIT-A-AH

## 2019-04-29 NOTE — Telephone Encounter (Signed)
Agree with ED  

## 2019-04-29 NOTE — ED Notes (Signed)
Assisted pt to bathroom. Urine collected.

## 2019-04-29 NOTE — ED Notes (Signed)
Family in room and updated on pt care

## 2019-04-29 NOTE — ED Provider Notes (Signed)
[facility] Emergency Department Provider Note  ____________________________________________  Time seen: Approximately 9:32 PM  I have reviewed the triage vital signs and the nursing notes.   HISTORY  Chief Complaint Back Pain    HPI Kimberly Cook is a 73 y.o. female with a history of CAD diverticulosis hypothyroidism stroke lumbar spondylosis and recurrent UTIs who comes to the ED complaining of increased back pain and bilateral leg pain and paresthesia, worsening for the past week.  Denies loss of bowel or bladder control, denies weakness or numbness of the lower legs but notes that her pain is just much more severe.  Started after taking out a bag of trash at home although she did use her walker in the process and denies any heavy lifting or sudden slips or falls.  No new trauma.  Denies fever or chills.  No chest pain or shortness of breath.  Symptoms are constant, worse with movement, no alleviating factors.  Cannot find a position of comfort.    Past Medical History:  Diagnosis Date  . Allergy   . Arthritis   . Coronary artery disease 04/2017   Mild to moderate CAD in LAD/diagonal by CTA (CT-FFR of apical LAD 0.79).  . Depression   . Diverticulosis   . Essential hypertension    Normal cardiolite 05/2006 EF 71%  . GERD (gastroesophageal reflux disease)   . Headache   . History of shingles   . Hyperlipidemia   . Hypothyroidism   . Mini stroke (HCC) 2011   . Occipital neuralgia   . Prediabetes   . Stroke (HCC)   . Stroke [facility](HCC)    MRI 04/2008 + left sup. frontal gyrus possibly puntate infarct   . Urinary tract infection   . Vitamin D deficiency      Patient Active Problem List   Diagnosis Date Noted  . Abnormal MRI, lumbar spine 02/18/2019  . Abnormal gait 02/18/2019  . Chronic back pain 02/18/2019  . Osteoarthritis 10/09/2018  . Overactive bladder 10/09/2018  . Chronic midline low back pain 04/22/2018  . Chronic pain of left knee  04/22/2018  . Toe fracture, left 03/10/2018  . Lightheadedness 01/21/2018  . PAC (premature atrial contraction) 01/19/2018  . PVC's (premature ventricular contractions) 01/19/2018  . Hernia, paraesophageal 12/16/2017  . Kidney cysts 12/16/2017  . CKD (chronic kidney disease) stage 3, GFR 30-59 ml/min (HCC) 12/16/2017  . Carotid artery stenosis 12/16/2017  . Chronic pain 12/16/2017  . Osteopenia 12/16/2017  . DDD (degenerative disc disease), cervical 12/16/2017  . Labile hypertension 09/05/2017  . Anxiety and depression 08/27/2017  . Insomnia 08/27/2017  . Syncope 08/27/2017  . Bilateral occipital neuralgia 08/27/2017  . Lung nodule 06/30/2017  . Coronary artery disease of native artery of native heart with stable angina pectoris (HCC) 06/03/2017  . Shortness of breath 04/11/2017  . Labile blood pressure 03/08/2017  . Syncope, near 03/08/2017  . Palpitations 03/08/2017  . Orthostatic lightheadedness 03/08/2017  . Essential hypertension   . S/P TKR (total knee replacement) 04/17/2015  . Morbid obesity (HCC) 09/21/2014  . Medication management 09/21/2014  . Hypothyroidism 02/17/2014  . Mixed hyperlipidemia   . GERD (gastroesophageal reflux disease)   . Vitamin D deficiency   . Depression      Past Surgical History:  Procedure Laterality Date  . ABDOMINAL HYSTERECTOMY    . BLADDER SURGERY     2003  . BREAST EXCISIONAL BIOPSY Right Over 20 years    Benign  . CHOLECYSTECTOMY    .  gastroplication     . JOINT REPLACEMENT Left   . KNEE ARTHROSCOPY Left 2011  . TONSILLECTOMY AND ADENOIDECTOMY    . TOTAL KNEE ARTHROPLASTY Left 04/17/2015   Procedure: LEFT TOTAL KNEE ARTHROPLASTY;  Surgeon: Durene RomansMatthew Olin, MD;  Location: [facility];  Service: Orthopedics;  Laterality: Left;     Prior to Admission medications   Medication Sig Start Date End Date Taking? Authorizing Provider  aspirin EC 81 MG tablet Take 1 tablet (81 mg total) by mouth daily. 10/09/18   McLean-Scocuzza, Pasty Spillersracy N, MD   atorvastatin (LIPITOR) 20 MG tablet Take 1 tablet (20 mg total) by mouth daily. 10/09/18   McLean-Scocuzza, Pasty Spillersracy N, MD  Biotin 1 MG CAPS Take 1 mg by mouth daily.     [provider]  cephALEXin (KEFLEX) 500 MG capsule Take 1 capsule (500 mg total) by mouth 2 (two) times daily. 04/29/19   Sharman CheekStafford, Maurine Mowbray, MD  cetirizine (ZYRTEC) 5 MG tablet Take 1-2 tablets (5-10 mg total) by mouth at bedtime as needed for allergies. 11/17/18   McLean-Scocuzza, Pasty Spillersracy N, MD  Cholecalciferol (VITAMIN D3) 125 MCG (5000 UT) TABS Take 1 tablet (5,000 Units total) by mouth daily. 10/09/18   McLean-Scocuzza, Pasty Spillersracy N, MD  ciprofloxacin (CIPRO) 500 MG tablet Take 1 tablet (500 mg total) by mouth 2 (two) times daily. Food 02/18/19   McLean-Scocuzza, Pasty Spillersracy N, MD  diclofenac sodium (VOLTAREN) 1 % GEL 2 grams qid to upper body and 4 grams qid to lower body prn 01/01/19   McLean-Scocuzza, Pasty Spillersracy N, MD  diltiazem (CARDIZEM SR) 60 MG 12 hr capsule Take 1 capsule (60 mg total) by mouth 2 (two) times daily. 10/09/18   McLean-Scocuzza, Pasty Spillersracy N, MD  escitalopram (LEXAPRO) 5 MG tablet Take 1 tablet (5 mg total) by mouth daily. 10/09/18   McLean-Scocuzza, Pasty Spillersracy N, MD  fluticasone (FLONASE) 50 MCG/ACT nasal spray Place 2 sprays into both nostrils daily as needed for allergies or rhinitis. Use after nasal saline 11/17/18   McLean-Scocuzza, Pasty Spillersracy N, MD  folic acid (FOLVITE) 400 MCG tablet Take 1 tablet (400 mcg total) by mouth daily. SEPARATE ALL SUPPLEMENTS TO LUNCH OR DINNER AND PRILOSEC NOT TO MESS W/THYROID MED 12/10/18   McLean-Scocuzza, Pasty Spillersracy N, MD  gabapentin (NEURONTIN) 100 MG capsule Take 1 capsule (100 mg total) by mouth 3 (three) times daily. 04/29/19 05/29/19  Sharman CheekStafford, Kolin Erdahl, MD  hydrALAZINE (APRESOLINE) 10 MG tablet Take 2 tablets (20 mg total) by mouth 2 (two) times daily as needed. BP>140/>90 11/02/18   McLean-Scocuzza, Pasty Spillersracy N, MD  hydrochlorothiazide (MICROZIDE) 12.5 MG capsule Take 1 capsule (12.5 mg total) by mouth daily. In  am 10/09/18   McLean-Scocuzza, Pasty Spillersracy N, MD  levothyroxine (SYNTHROID, LEVOTHROID) 88 MCG tablet Take 1 tablet (88 mcg total) by mouth daily before breakfast. Skip sundays 10/09/18   McLean-Scocuzza, Pasty Spillersracy N, MD  LORazepam (ATIVAN) 0.5 MG tablet Take 1 tablet (0.5 mg total) by mouth 2 (two) times daily as needed for anxiety or sleep. Or 1 mg at night for sleep 01/07/19   McLean-Scocuzza, Pasty Spillersracy N, MD  montelukast (SINGULAIR) 10 MG tablet Take 1 tablet (10 mg total) by mouth at bedtime. 02/09/19   McLean-Scocuzza, Pasty Spillersracy N, MD  mupirocin ointment (BACTROBAN) 2 % Apply 1 application topically 2 (two) times daily. Right lower leg 10/09/18   McLean-Scocuzza, Pasty Spillersracy N, MD  omeprazole (PRILOSEC) 40 MG capsule Take 1 capsule (40 mg total) by mouth daily. 10/09/18   McLean-Scocuzza, Pasty Spillersracy N, MD  oxyCODONE-acetaminophen (PERCOCET) 5-325 MG tablet Take  0.5-1 tablets by mouth 2 (two) times daily as needed for severe pain. 01/01/19   McLean-Scocuzza, Pasty Spillers, MD  sodium chloride (OCEAN) 0.65 % SOLN nasal spray Place 2 sprays into both nostrils as needed for congestion. Use 1st 11/17/18   McLean-Scocuzza, Pasty Spillers, MD  tiZANidine (ZANAFLEX) 4 MG tablet Take 1 tablet (4 mg total) by mouth 2 (two) times daily as needed for muscle spasms. 01/01/19   McLean-Scocuzza, Pasty Spillers, MD  traZODone (DESYREL) 50 MG tablet Take 1-2 tablets (50-100 mg total) by mouth at bedtime as needed for sleep. 1 hr before bed 10/09/18   McLean-Scocuzza, Pasty Spillers, MD  vitamin B-12 (CYANOCOBALAMIN) 1000 MCG tablet Take 1 tablet (1,000 mcg total) by mouth daily. 10/09/18   McLean-Scocuzza, Pasty Spillers, MD     Allergies Patient has no known allergies.   Family History  Problem Relation Age of Onset  . Heart disease Mother   . Hypertension Mother   . Diabetes Mother   . Heart attack Mother 33  . Heart disease Father   . Heart attack Father 35  . Breast cancer Maternal Aunt   . Heart attack Brother     Social History Social History   Tobacco Use  . Smoking  status: Passive Smoke Exposure - Never Smoker  . Smokeless tobacco: Never Used  . Tobacco comment: husbands and children smoked in home.   Substance Use Topics  . Alcohol use: No  . Drug use: No    Review of Systems  Constitutional:   No fever or chills.  ENT:   No sore throat. No rhinorrhea. Cardiovascular:   No chest pain or syncope. Respiratory:   No dyspnea or cough. Gastrointestinal:   Negative for abdominal pain, vomiting and diarrhea.  Musculoskeletal:   Acute on chronic low back pain. All other systems reviewed and are negative except as documented above in ROS and HPI.  ____________________________________________   PHYSICAL EXAM:  VITAL SIGNS: ED Triage Vitals  Enc Vitals Group     BP 04/29/19 1615 (!) 149/87     Pulse Rate 04/29/19 1612 76     Resp 04/29/19 1630 16     Temp 04/29/19 1612 99.4 F (37.4 C)     Temp Source 04/29/19 1612 Oral     SpO2 04/29/19 1611 97 %     Weight 04/29/19 1614 170 lb (77.1 kg)     Height 04/29/19 1614  (1.6 m)     Head Circumference --      Peak Flow --      Pain Score 04/29/19 1612 10     Pain Loc --      Pain Edu? --      Excl. in GC? --     Vital signs reviewed, nursing assessments reviewed.   Constitutional:   Alert and oriented. Non-toxic appearance. Eyes:   Conjunctivae are normal. EOMI. PERRL. ENT      Head:   Normocephalic and atraumatic.      Nose:   Wearing a mask.      Mouth/Throat:   Wearing a mask.      Neck:   No meningismus. Full ROM. Hematological/Lymphatic/Immunilogical:   No cervical lymphadenopathy. Cardiovascular:   RRR. Symmetric bilateral radial and DP pulses.  No murmurs. Cap refill less than 2 seconds. Respiratory:   Normal respiratory effort without tachypnea/retractions. Breath sounds are clear and equal bilaterally. No wheezes/rales/rhonchi. Gastrointestinal:   Soft and nontender. Non distended. There is no CVA tenderness.  No rebound, rigidity,  or guarding.  Musculoskeletal:   Normal  range of motion in all extremities. No joint effusions.  No lower extremity tenderness.  No edema.  Positive straight leg raise on the left.  No midline spinal tenderness. Neurologic:   Normal speech and language.  Motor grossly intact. No acute focal neurologic deficits are appreciated.  Skin:    Skin is warm, dry and intact. No rash noted.  No petechiae, purpura, or bullae.  ____________________________________________    LABS (pertinent positives/negatives) (all labs ordered are listed, but only abnormal results are displayed) Labs Reviewed  URINALYSIS, COMPLETE (UACMP) WITH MICROSCOPIC - Abnormal; Notable for the following components:      Result Value   Color, Urine YELLOW (*)    APPearance HAZY (*)    Nitrite POSITIVE (*)    Leukocytes,Ua LARGE (*)    WBC, UA >50 (*)    Bacteria, UA MANY (*)    All other components within normal limits  URINE CULTURE   ____________________________________________   EKG    ____________________________________________    RADIOLOGY  Mr Lumbar Spine Wo Contrast  Result Date: 04/29/2019 CLINICAL DATA:  Initial evaluation for acute back pain, unable to walk. EXAM: MRI LUMBAR SPINE WITHOUT CONTRAST TECHNIQUE: Multiplanar, multisequence MR imaging of the lumbar spine was performed. No intravenous contrast was administered. COMPARISON:  Prior MRI from 01/15/2019. FINDINGS: Segmentation: Standard. Lowest well-formed disc labeled the L5-S1 level. Alignment: Moderate levoscoliosis of the thoracolumbar spine, stable. Trace 3 mm retrolisthesis of L4 on L5, with additional trace retrolisthesis of L1 on L2. Overall, appearance is stable from previous. Vertebrae: Vertebral body height maintained without evidence for acute or interval fracture. Bone marrow signal intensity within normal limits. Subcentimeter benign hemangioma noted within the T11 vertebral body. No worrisome osseous lesions. Reactive endplate changes noted throughout the lumbar spine,  largely due to scoliotic curvature, most notable at T12-L1, similar to previous. No other abnormal marrow edema. Conus medullaris and cauda equina: Conus extends to the L1 level. Conus and cauda equina appear normal. Paraspinal and other soft tissues: Paraspinous soft tissues demonstrate no acute finding. Simple bilateral renal cysts noted, stable. Visualized visceral structures otherwise unremarkable. Disc levels: T12-L1: Diffuse disc bulge with disc desiccation and intervertebral disc space narrowing. Predominant left-sided reactive endplate changes with marginal endplate osteophytic spurring. Mild bilateral facet hypertrophy. Resultant mild narrowing of the left lateral recess. No significant spinal stenosis. Mild left foraminal narrowing. Appearance is unchanged. L1-2: Diffuse disc bulge with disc desiccation and intervertebral disc space narrowing. Right greater than left reactive endplate changes with marginal endplate osteophytic spurring. Disc bulging and endplate changes asymmetric to the right. Superimposed small right subarticular disc extrusion with inferior migration (series 10, image 16). Superimposed mild facet hypertrophy. Resultant moderate right lateral recess stenosis without significant narrowing of the central canal. Mild right L1 foraminal narrowing. Overall, appearance is stable. L2-3: Mild diffuse disc bulge with disc desiccation and intervertebral disc space narrowing. Disc bulging asymmetric to the right with right-sided reactive endplate changes. Moderate facet and ligament flavum hypertrophy. Resultant mild right lateral recess stenosis, stable. Moderate right L2 foraminal narrowing, also unchanged. L3-4: Diffuse disc bulge with disc desiccation and intervertebral disc space narrowing. Right greater than left reactive endplate changes with marginal endplate spurring. Moderate right worse than left facet degeneration. Resultant moderate right lateral recess stenosis without significant  narrowing of the central canal. Moderate right L3 foraminal narrowing. Appearance is stable. L4-5: Chronic intervertebral disc space narrowing with diffuse disc bulge and disc desiccation. Disc bulging asymmetric  to the left with left-sided reactive endplate changes. Moderate facet and ligament flavum hypertrophy. Resultant mild canal with moderate left lateral recess stenosis, stable. Moderate left greater than right L4 foraminal narrowing, also unchanged. L5-S1: Mild disc bulge, asymmetric to the left. Bulging disc contacts the exiting left L5 nerve root in the left neural foramen, stable (series 6, image 13). Moderate facet degeneration with associated trace reactive joint effusions. No significant canal or lateral recess stenosis. Mild left greater than right L5 foraminal narrowing. IMPRESSION: 1. Overall, no significant interval change in appearance of the lumbar spine as compared to recent MRI from 01/15/2019. No new or progressive findings. 2. Moderate levoscoliosis with multilevel lumbar spondylosis and facet degeneration. Associated mild to moderate bilateral lateral recess and foraminal narrowing at L1-2 through L4-5 as above. Electronically Signed   By: Jeannine Boga M.D.   On: 04/29/2019 20:14    ____________________________________________   PROCEDURES Procedures  ____________________________________________  DIFFERENTIAL DIAGNOSIS   Cauda equina syndrome, neuropathic pain/chronic pain syndrome, cystitis  CLINICAL IMPRESSION / ASSESSMENT AND PLAN / ED COURSE  Medications ordered in the ED: Medications  LORazepam (ATIVAN) tablet 1 mg (has no administration in time range)  gabapentin (NEURONTIN) capsule 100 mg (has no administration in time range)  morphine 4 MG/ML injection 4 mg (4 mg Intravenous Given 04/29/19 1828)  cephALEXin (KEFLEX) capsule 500 mg (500 mg Oral Given 04/29/19 2131)    Pertinent labs & imaging results that were available during my care of the patient were  reviewed by me and considered in my medical decision making (see chart for details).  Kimberly Cook was evaluated in Emergency Department on 04/29/2019 for the symptoms described in the history of present illness. She was evaluated in the context of the global COVID-19 pandemic, which necessitated consideration that the patient might be at risk for infection with the SARS-CoV-2 virus that causes COVID-19. Institutional protocols and algorithms that pertain to the evaluation of patients at risk for COVID-19 are in a state of rapid change based on information released by regulatory bodies including the CDC and federal and state organizations. These policies and algorithms were followed during the patient's care in the ED.   Patient presents with acute worsening of chronic back pain syndrome.  After spontaneous void, bladder scan shows residual volume of 20 mL.  Will obtain MRI lumbar spine to evaluate for complication of prior procedure or worsened degeneration and herniation of lumbar disc that may be causing a central cord syndrome.  Clinical Course as of Apr 29 2131  Thu Apr 29, 2019  2111 UA reveals nitrite positive UTI.  Urine culture pending.   [PS]    Clinical Course User Index [PS] Carrie Mew, MD    ----------------------------------------- 9:35 PM on 04/29/2019 -----------------------------------------  MRI unchanged from previous in June of this year.  Review of electronic medical record shows that she has had 3 UTIs this year, each growing pansensitive E. coli on culture.  I will start her on Keflex.  Discussed with patient her goal of minimizing opioid use, and I think it would be appropriate to start her on gabapentin at this time.  Will start low-dose, 100 mg 3 times daily, have her follow-up with her primary care and pain management, continue on Keflex as well.   ____________________________________________   FINAL CLINICAL IMPRESSION(S) / ED DIAGNOSES    Final  diagnoses:  Lumbar spondylolysis  Cystitis  Chronic bilateral low back pain, unspecified whether sciatica present     ED Discharge Orders  Ordered    cephALEXin (KEFLEX) 500 MG capsule  2 times daily     04/29/19 2132    gabapentin (NEURONTIN) 100 MG capsule  3 times daily     04/29/19 2132          Portions of this note were generated with dragon dictation software. Dictation errors may occur despite best attempts at proofreading.   Sharman Cheek, MD 04/29/19 2136

## 2019-04-29 NOTE — ED Notes (Signed)
Patient transported to MRI 

## 2019-04-29 NOTE — ED Triage Notes (Addendum)
Pt arrived from home, back pain, had procedure to back on 04/07/19, burning and tingling pain, restless feet/legs.  147/98 BP, 82 HR.  Pt reports she has been unable to have a bowel movement the last couple of days, then had two today after straining.

## 2019-05-01 LAB — URINE CULTURE: Culture: >=100000 — AB

## 2019-05-02 NOTE — Progress Notes (Signed)
ED Antimicrobial Stewardship Positive Culture Follow Up   Kimberly Cook is an 73 y.o. female who presented to [facility] on 04/29/2019 with a chief complaint of  Chief Complaint  Patient presents with  . Back Pain  Hx recurrent UTIs  Recent Results (from the past 720 hour(s))  Urine culture     Status: Abnormal   Collection Time: 04/29/19  8:24 PM   Specimen: Urine, Random  Result Value Ref Range Status   Specimen Description   Final    URINE, RANDOM Performed at [facility], [address], [place], [place] [address]    Special Requests   Final    NONE Performed at [facility], [address] [place], [place] [address]    Culture (A)  Final    >=100,000 COLONIES/mL ESCHERICHIA COLI Confirmed Extended Spectrum Beta-Lactamase Producer (ESBL).  In bloodstream infections from ESBL organisms, carbapenems are preferred over piperacillin/tazobactam. They are shown to have a lower risk of mortality.    Report Status 05/01/2019 FINAL  Final   Organism ID, Bacteria ESCHERICHIA COLI (A)  Final      Susceptibility   Escherichia coli - MIC*    AMPICILLIN >=32 RESISTANT Resistant     CEFAZOLIN >=64 RESISTANT Resistant     CEFTRIAXONE RESISTANT Resistant     CIPROFLOXACIN >=4 RESISTANT Resistant     GENTAMICIN <=1 SENSITIVE Sensitive     IMIPENEM <=0.25 SENSITIVE Sensitive     NITROFURANTOIN <=16 SENSITIVE Sensitive     TRIMETH/SULFA >=320 RESISTANT Resistant     AMPICILLIN/SULBACTAM 4 SENSITIVE Sensitive     PIP/TAZO <=4 SENSITIVE Sensitive     Extended ESBL POSITIVE Resistant     * >=100,000 COLONIES/mL ESCHERICHIA COLI    [x]  Treated with Cephalexin, organism resistant to prescribed antimicrobial  New antibiotic prescription: Nitrofurantoin (Macrobid 100mg  PO BID x 7 days.  05/02/2019 1440 Called patient at [phone]. Her preferred pharmacy is [facility] [phone]. Called in Prescription on voicemail for above antibiotic. Explained to  Patient to stop cephalexin and start this new abx. Also, If having fevers or getting worse or not improving see PCP or come back to ER for possible need for IV antibiotics  ED Provider: Marjean Donna   Cynthya Yam A 05/02/2019, 2:43 PM Clinical Pharmacist

## 2019-05-05 ENCOUNTER — Other Ambulatory Visit: Payer: Self-pay

## 2019-05-07 ENCOUNTER — Encounter: Payer: Self-pay | Admitting: Internal Medicine

## 2019-05-07 ENCOUNTER — Other Ambulatory Visit: Payer: Self-pay

## 2019-05-07 ENCOUNTER — Telehealth: Payer: Self-pay | Admitting: *Deleted

## 2019-05-07 ENCOUNTER — Ambulatory Visit (INDEPENDENT_AMBULATORY_CARE_PROVIDER_SITE_OTHER): Payer: Medicare HMO | Admitting: Internal Medicine

## 2019-05-07 VITALS — BP 134/80 | HR 93 | Temp 97.9°F | Ht 63.0 in | Wt 181.2 lb

## 2019-05-07 DIAGNOSIS — N390 Urinary tract infection, site not specified: Secondary | ICD-10-CM | POA: Diagnosis not present

## 2019-05-07 DIAGNOSIS — M4802 Spinal stenosis, cervical region: Secondary | ICD-10-CM | POA: Diagnosis not present

## 2019-05-07 DIAGNOSIS — M47816 Spondylosis without myelopathy or radiculopathy, lumbar region: Secondary | ICD-10-CM | POA: Insufficient documentation

## 2019-05-07 DIAGNOSIS — M479 Spondylosis, unspecified: Secondary | ICD-10-CM

## 2019-05-07 DIAGNOSIS — M5442 Lumbago with sciatica, left side: Secondary | ICD-10-CM

## 2019-05-07 DIAGNOSIS — M8580 Other specified disorders of bone density and structure, unspecified site: Secondary | ICD-10-CM

## 2019-05-07 DIAGNOSIS — G8929 Other chronic pain: Secondary | ICD-10-CM | POA: Diagnosis not present

## 2019-05-07 DIAGNOSIS — I10 Essential (primary) hypertension: Secondary | ICD-10-CM

## 2019-05-07 MED ORDER — GABAPENTIN 100 MG PO CAPS: ORAL_CAPSULE | ORAL | 3 refills | Status: DC

## 2019-05-07 MED ORDER — GABAPENTIN 300 MG PO CAPS: 300.0000 mg | ORAL_CAPSULE | Freq: Two times a day (BID) | ORAL | 3 refills | Status: DC

## 2019-05-07 NOTE — Progress Notes (Signed)
Chief Complaint  Patient presents with  . Follow-up   F/u  1. S/p ED f/u 04/29/2019 worse back and neck pain since 04/07/2019 L3,4/4 radiofrequency with Dr. Yves Dillhasnis pain is 10/20 radiating from spine to legs L>R and toes and her legs were jumping uncontrollably b/l legs on 04/29/2019 it is hard for her to move 6 feet or less worsens pain given hydrocodone and gabapentin 100 mg tid in the ED  She is not currently doing PT for now Activities limited    2. Recurrent UTI agreeable to see urogyn on keflex but resistant and pt reports she started another Abx Sunday to Monday unsure the name and total cares phone not working   3. ostoepenia T score -2.3 with high frax >21 % hip disc prolia today 4. HTN controlled today    Review of Systems  Constitutional: Negative for weight loss.       Wt gain 30 lbs    HENT: Negative for hearing loss.   Eyes: Negative for blurred vision.  Respiratory: Negative for shortness of breath.   Cardiovascular: Negative for chest pain.  Genitourinary: Negative for dysuria.  Musculoskeletal: Positive for back pain and neck pain.  Skin: Negative for rash.  Neurological: Positive for tingling and sensory change.  Psychiatric/Behavioral: Negative for depression.   Past Medical History:  Diagnosis Date  . Allergy   . Arthritis   . Coronary artery disease 04/2017   Mild to moderate CAD in LAD/diagonal by CTA (CT-FFR of apical LAD 0.79).  . Depression   . Diverticulosis   . Essential hypertension    Normal cardiolite 05/2006 EF 71%  . GERD (gastroesophageal reflux disease)   . Headache   . History of shingles   . Hyperlipidemia   . Hypothyroidism   . Mini stroke (HCC) 2011   . Occipital neuralgia   . Prediabetes   . Stroke (HCC)   . Stroke (HCC)    MRI 04/2008 + left sup. frontal gyrus possibly puntate infarct   . Urinary tract infection   . Vitamin D deficiency    Past Surgical History:  Procedure Laterality Date  . ABDOMINAL HYSTERECTOMY    . BLADDER  SURGERY     20 03  . BREAST EXCISIONAL BIOPSY Right Over 20 years    Benign  . CHOLECYSTECTOMY    . gastroplication     . JOINT REPLACEMENT Left   . KNEE ARTHROSCOPY Left 2011  . TONSILLECTOMY AND ADENOIDECTOMY    . TOTAL KNEE ARTHROPLASTY Left 04/17/2015   Procedure: LEFT TOTAL KNEE ARTHROPLASTY;  Surgeon: Durene RomansMatthew Olin, MD;  Location: [facility];  Service: Orthopedics;  Laterality: Left;   Family History  Problem Relation Age of Onset  . Heart disease Mother   . Hypertension Mother   . Diabetes Mother   . Heart attack Mother [id number]  . Heart disease Father   . Heart attack Father [id number]  . Breast cancer Maternal Aunt   . Heart attack Brother    Social History   Socioeconomic History  . Marital status: Widowed    Spouse name: Not on file  . Number of children: Not on file  . Years of education: Not on file  . Highest education level: Not on file  Occupational History  . Not on file  Social Needs  . Financial resource strain: Not hard at all  . Food insecurity    Worry: Never true    Inability: Never true  . Transportation needs    Medical: No  Non-medical: No  Tobacco Use  . Smoking status: Passive Smoke Exposure - Never Smoker  . Smokeless tobacco: Never Used  . Tobacco comment: husbands and children smoked in home.   Substance and Sexual Activity  . Alcohol use: No  . Drug use: No  . Sexual activity: Never  Lifestyle  . Physical activity    Days per week: 7 days    Minutes per session: 30 min  . Stress: Not on file  Relationships  . Social Musician on phone: Not on file    Gets together: Not on file    Attends religious service: Not on file    Active member of club or organization: Not on file    Attends meetings of clubs or organizations: Not on file    Relationship status: Not on file  . Intimate partner violence    Fear of current or ex partner: Not on file    Emotionally abused: Not on file    Physically abused: Not on file    Forced sexual  activity: Not on file  Other Topics Concern  . Not on file  Social History Narrative   Widowed    [place]    Has kids and grandson    Current Meds  Medication Sig  . aspirin EC 81 MG tablet Take 1 tablet (81 mg total) by mouth daily.  Marland Kitchen atorvastatin (LIPITOR) 20 MG tablet Take 1 tablet (20 mg total) by mouth daily.  . Biotin 1 MG CAPS Take 1 mg by mouth daily.   . cetirizine (ZYRTEC) 5 MG tablet Take 1-2 tablets (5-10 mg total) by mouth at bedtime as needed for allergies.  . Cholecalciferol (VITAMIN D3) 125 MCG (5000 UT) TABS Take 1 tablet (5,000 Units total) by mouth daily.  . diclofenac sodium (VOLTAREN) 1 % GEL 2 grams qid to upper body and 4 grams qid to lower body prn  . diltiazem (CARDIZEM SR) 60 MG 12 hr capsule Take 1 capsule (60 mg total) by mouth 2 (two) times daily.  Marland Kitchen escitalopram (LEXAPRO) 5 MG tablet Take 1 tablet (5 mg total) by mouth daily.  . fluticasone (FLONASE) 50 MCG/ACT nasal spray Place 2 sprays into both nostrils daily as needed for allergies or rhinitis. Use after nasal saline  . folic acid (FOLVITE) 400 MCG tablet Take 1 tablet (400 mcg total) by mouth daily. SEPARATE ALL SUPPLEMENTS TO LUNCH OR DINNER AND PRILOSEC NOT TO MESS W/THYROID MED  . gabapentin (NEURONTIN) 100 MG capsule 1 pill in the am and 2 pills qhs  . hydrALAZINE (APRESOLINE) 10 MG tablet Take 2 tablets (20 mg total) by mouth 2 (two) times daily as needed. BP>140/>90  . hydrochlorothiazide (MICROZIDE) 12.5 MG capsule Take 1 capsule (12.5 mg total) by mouth daily. In am  . levothyroxine (SYNTHROID, LEVOTHROID) 88 MCG tablet Take 1 tablet (88 mcg total) by mouth daily before breakfast. Skip sundays  . LORazepam (ATIVAN) 0.5 MG tablet Take 1 tablet (0.5 mg total) by mouth 2 (two) times daily as needed for anxiety or sleep. Or 1 mg at night for sleep  . montelukast (SINGULAIR) 10 MG tablet Take 1 tablet (10 mg total) by mouth at bedtime.  . mupirocin ointment (BACTROBAN) 2 % Apply 1 application  topically 2 (two) times daily. Right lower leg  . omeprazole (PRILOSEC) 40 MG capsule Take 1 capsule (40 mg total) by mouth daily.  . sodium chloride (OCEAN) 0.65 % SOLN nasal spray Place 2 sprays into  both nostrils as needed for congestion. Use 1st  . tiZANidine (ZANAFLEX) 4 MG tablet Take 1 tablet (4 mg total) by mouth 2 (two) times daily as needed for muscle spasms.  . traZODone (DESYREL) 50 MG tablet Take 1-2 tablets (50-100 mg total) by mouth at bedtime as needed for sleep. 1 hr before bed  . vitamin B-12 (CYANOCOBALAMIN) 1000 MCG tablet Take 1 tablet (1,000 mcg total) by mouth daily.  . [DISCONTINUED] cephALEXin (KEFLEX) 500 MG capsule Take 1 capsule (500 mg total) by mouth 2 (two) times daily.  . [DISCONTINUED] gabapentin (NEURONTIN) 100 MG capsule Take 1 capsule (100 mg total) by mouth 3 (three) times daily.  . [DISCONTINUED] gabapentin (NEURONTIN) 300 MG capsule Take 1 capsule (300 mg total) by mouth 2 (two) times daily. If too sleepy take 100 mg in the am and 300 mg at night. Pt has 100 mg pills  . [DISCONTINUED] oxyCODONE-acetaminophen (PERCOCET) 5-325 MG tablet Take 0.5-1 tablets by mouth 2 (two) times daily as needed for severe pain.   No Known Allergies Recent Results (from the past 2160 hour(s))  Urine Culture     Status: Abnormal   Collection Time: 02/19/19  9:23 AM   Specimen: Urine  Result Value Ref Range   MICRO NUMBER: [id number]    SPECIMEN QUALITY: Adequate    Sample Source NOT GIVEN    STATUS: FINAL    ISOLATE 1: Escherichia coli (A)     Comment: Greater than 100,000 CFU/mL of Escherichia coli      Susceptibility   Escherichia coli - URINE CULTURE, REFLEX    AMOX/CLAVULANIC <=2 Sensitive     AMPICILLIN <=2 Sensitive     AMPICILLIN/SULBACTAM 4 Sensitive     CEFAZOLIN* <=4 Not Reportable      * For infections other than uncomplicated UTIcaused by E. coli, K. pneumoniae or P. mirabilis:Cefazolin is resistant if MIC > or = 8 mcg/mL.(Distinguishing susceptible versus  intermediatefor isolates with MIC < or = 4 mcg/mL requiresadditional testing.)For uncomplicated UTI caused by E. coli,K. pneumoniae or P. mirabilis: Cefazolin issusceptible if MIC <32 mcg/mL and predictssusceptible to the oral agents cefaclor, cefdinir,cefpodoxime, cefprozil, cefuroxime, cephalexinand loracarbef.    CEFEPIME <=1 Sensitive     CEFTRIAXONE <=1 Sensitive     CIPROFLOXACIN <=0.25 Sensitive     LEVOFLOXACIN <=0.12 Sensitive     ERTAPENEM <=0.5 Sensitive     GENTAMICIN <=1 Sensitive     IMIPENEM <=0.25 Sensitive     NITROFURANTOIN <=16 Sensitive     PIP/TAZO <=4 Sensitive     TOBRAMYCIN <=1 Sensitive     TRIMETH/SULFA* >=320 Resistant      * For infections other than uncomplicated UTIcaused by E. coli, K. pneumoniae or P. mirabilis:Cefazolin is resistant if MIC > or = 8 mcg/mL.(Distinguishing susceptible versus intermediatefor isolates with MIC < or = 4 mcg/mL requiresadditional testing.)For uncomplicated UTI caused by E. coli,K. pneumoniae or P. mirabilis: Cefazolin issusceptible if MIC <32 mcg/mL and predictssusceptible to the oral agents cefaclor, cefdinir,cefpodoxime, cefprozil, cefuroxime, cephalexinand loracarbef.Legend:S = Susceptible  I = IntermediateR = Resistant  NS = Not susceptible* = Not tested  NR = Not reported**NN = See antimicrobic comments  Urinalysis, Routine w reflex microscopic     Status: Abnormal   Collection Time: 02/19/19  9:23 AM  Result Value Ref Range   Color, Urine YELLOW YELLOW   APPearance TURBID (A) CLEAR   Specific Gravity, Urine 1.018 1.001 - 1.03   pH < OR = 5.0 5.0 - 8.0   Glucose, UA  NEGATIVE NEGATIVE   Bilirubin Urine NEGATIVE NEGATIVE   Ketones, ur NEGATIVE NEGATIVE   Hgb urine dipstick 1+ (A) NEGATIVE   Protein, ur 1+ (A) NEGATIVE   Nitrite POSITIVE (A) NEGATIVE   Leukocytes,Ua 3+ (A) NEGATIVE   WBC, UA PACKED (A) 0 - 5 /HPF   RBC / HPF 10-20 (A) 0 - 2 /HPF   Squamous Epithelial / LPF 0-5 < OR = 5 /HPF   Bacteria, UA MANY (A) NONE SEEN  /HPF   Hyaline Cast NONE SEEN NONE SEEN /LPF  Urinalysis, Complete w Microscopic     Status: Abnormal   Collection Time: 04/29/19  8:24 PM  Result Value Ref Range   Color, Urine YELLOW (A) YELLOW   APPearance HAZY (A) CLEAR   Specific Gravity, Urine 1.009 1.005 - 1.030   pH 6.0 5.0 - 8.0   Glucose, UA NEGATIVE NEGATIVE mg/dL   Hgb urine dipstick NEGATIVE NEGATIVE   Bilirubin Urine NEGATIVE NEGATIVE   Ketones, ur NEGATIVE NEGATIVE mg/dL   Protein, ur NEGATIVE NEGATIVE mg/dL   Nitrite POSITIVE (A) NEGATIVE   Leukocytes,Ua LARGE (A) NEGATIVE   RBC / HPF 0-5 0 - 5 RBC/hpf   WBC, UA >50 (H) 0 - 5 WBC/hpf   Bacteria, UA MANY (A) NONE SEEN   Squamous Epithelial / LPF 0-5 0 - 5   Mucus PRESENT     Comment: Performed at [facility], [address], [place], [place] [address]  Urine culture     Status: Abnormal   Collection Time: 04/29/19  8:24 PM   Specimen: Urine, Random  Result Value Ref Range   Specimen Description      URINE, RANDOM Performed at [facility], [address] [place], [place] [address]    Special Requests      NONE Performed at [facility], [address] [place], [place] [address]    Culture (A)     >=100,000 COLONIES/mL ESCHERICHIA COLI Confirmed Extended Spectrum Beta-Lactamase Producer (ESBL).  In bloodstream infections from ESBL organisms, carbapenems are preferred over piperacillin/tazobactam. They are shown to have a lower risk of mortality.    Report Status 05/01/2019 FINAL    Organism ID, Bacteria ESCHERICHIA COLI (A)       Susceptibility   Escherichia coli - MIC*    AMPICILLIN >=32 RESISTANT Resistant     CEFAZOLIN >=64 RESISTANT Resistant     CEFTRIAXONE RESISTANT Resistant     CIPROFLOXACIN >=4 RESISTANT Resistant     GENTAMICIN <=1 SENSITIVE Sensitive     IMIPENEM <=0.25 SENSITIVE Sensitive     NITROFURANTOIN <=16 SENSITIVE Sensitive     TRIMETH/SULFA >=320 RESISTANT Resistant     AMPICILLIN/SULBACTAM 4  SENSITIVE Sensitive     PIP/TAZO <=4 SENSITIVE Sensitive     Extended ESBL POSITIVE Resistant     * >=100,000 COLONIES/mL ESCHERICHIA COLI   Objective  Body mass index is 32.1 kg/m. Wt Readings from Last 3 Encounters:  05/07/19 181 lb 3.2 oz (82.2 kg)  04/29/19 170 lb (77.1 kg)  02/18/19 161 lb (73 kg)   Temp Readings from Last 3 Encounters:  05/07/19 97.9 F (36.6 C) (Oral)  04/29/19 99.4 F (37.4 C) (Oral)  10/09/18 98.7 F (37.1 C) (Oral)   BP Readings from Last 3 Encounters:  05/07/19 134/80  04/29/19 (!) 148/91  10/09/18 138/88   Pulse Readings from Last 3 Encounters:  05/07/19 93  04/29/19 86  10/09/18 89    Physical Exam Vitals signs and nursing note  reviewed.  Constitutional:      Appearance: Normal appearance. She is well-developed and well-groomed. She is obese.  HENT:     Head: Normocephalic and atraumatic.     Comments: +mask on   Eyes:     Conjunctiva/sclera: Conjunctivae normal.     Pupils: Pupils are equal, round, and reactive to light.  Cardiovascular:     Rate and Rhythm: Normal rate and regular rhythm.     Heart sounds: Normal heart sounds. No murmur.  Pulmonary:     Effort: Pulmonary effort is normal.     Breath sounds: Normal breath sounds.  Musculoskeletal:     Cervical back: She exhibits tenderness.     Thoracic back: She exhibits tenderness.     Lumbar back: She exhibits tenderness.  Skin:    General: Skin is warm and dry.  Neurological:     General: No focal deficit present.     Mental Status: She is alert and oriented to person, place, and time. Mental status is at baseline.     Gait: Gait abnormal.     Comments: Walking with rollator today slow     Psychiatric:        Attention and Perception: Attention and perception normal.        Mood and Affect: Mood and affect normal.        Speech: Speech normal.        Behavior: Behavior normal. Behavior is cooperative.        Thought Content: Thought content normal.        Cognition  and Memory: Cognition and memory normal.        Judgment: Judgment normal.     Assessment  Plan  Chronic bilateral low back pain with left-sided sciatica - Plan: Ambulatory referral to Neurosurgery Dr. Donovan Kail, Ambulatory referral to Physical Medicine Rehab Dr. Sharlet Salina, gabapentin (NEURONTIN) 100 MG capsule tid c/w increased wt gain with increased dose   Recurrent UTI - Plan: Ambulatory referral to Urogynecology Dr. Estelle Grumbles, Urine Culture repeat weds   Spinal stenosis of cervical region - Plan: gabapentin (NEURONTIN) 100 MG capsule tid  Spondylosis - Plan: gabapentin (NEURONTIN) 100 MG capsule tid  Essential hypertension -cont meds improved and controlled   Osteopenia, unspecified location  -rec prolia disc injections today   HM Flu shot utd 04/29/19  Tdapconsider in future unable to find record pna23 utd prevnar had 06/16/15 shingrix 1/2 10/18/19likely will need to repeat series should have had 2nd ose in 11/2018 Consider hep B vaccine  A1C 5.0  colonoscopy had 06/03/11 Dr. Michail Sermon diverticulosis f/u in 10 years  mammo [facility] 04/14/19 neg   Out of age window pap last 01/26/10 neg   dexa 04/14/19 osteopenia with high frax score disc prolia  hcv neg Lipid had 08/12/17 reviewed CT chestoverdue ordered by Dr. Lake Bells Will need dermatology in future$ is issue currently   Provider: Dr. Olivia Mackie McLean-Scocuzza-Internal Medicine

## 2019-05-07 NOTE — Patient Instructions (Addendum)
Prolia/Denosumab injection What is this medicine? DENOSUMAB (den oh sue mab) slows bone breakdown. Prolia is used to treat osteoporosis in women after menopause and in men, and in people who are taking corticosteroids for 6 months or more. Delton See is used to treat a high calcium level due to cancer and to prevent bone fractures and other bone problems caused by multiple myeloma or cancer bone metastases. Delton See is also used to treat giant cell tumor of the bone. This medicine may be used for other purposes; ask your health care provider or pharmacist if you have questions. COMMON BRAND NAME(S): Prolia, XGEVA What should I tell my health care provider before I take this medicine? They need to know if you have any of these conditions:  dental disease  having surgery or tooth extraction  infection  kidney disease  low levels of calcium or Vitamin D in the blood  malnutrition  on hemodialysis  skin conditions or sensitivity  thyroid or parathyroid disease  an unusual reaction to denosumab, other medicines, foods, dyes, or preservatives  pregnant or trying to get pregnant  breast-feeding How should I use this medicine? This medicine is for injection under the skin. It is given by a health care professional in a hospital or clinic setting. A special MedGuide will be given to you before each treatment. Be sure to read this information carefully each time. For Prolia, talk to your pediatrician regarding the use of this medicine in children. Special care may be needed. For Delton See, talk to your pediatrician regarding the use of this medicine in children. While this drug may be prescribed for children as young as 13 years for selected conditions, precautions do apply. Overdosage: If you think you have taken too much of this medicine contact a poison control center or emergency room at once. NOTE: This medicine is only for you. Do not share this medicine with others. What if I miss a dose? It  is important not to miss your dose. Call your doctor or health care professional if you are unable to keep an appointment. What may interact with this medicine? Do not take this medicine with any of the following medications:  other medicines containing denosumab This medicine may also interact with the following medications:  medicines that lower your chance of fighting infection  steroid medicines like prednisone or cortisone This list may not describe all possible interactions. Give your health care provider a list of all the medicines, herbs, non-prescription drugs, or dietary supplements you use. Also tell them if you smoke, drink alcohol, or use illegal drugs. Some items may interact with your medicine. What should I watch for while using this medicine? Visit your doctor or health care professional for regular checks on your progress. Your doctor or health care professional may order blood tests and other tests to see how you are doing. Call your doctor or health care professional for advice if you get a fever, chills or sore throat, or other symptoms of a cold or flu. Do not treat yourself. This drug may decrease your body's ability to fight infection. Try to avoid being around people who are sick. You should make sure you get enough calcium and vitamin D while you are taking this medicine, unless your doctor tells you not to. Discuss the foods you eat and the vitamins you take with your health care professional. See your dentist regularly. Brush and floss your teeth as directed. Before you have any dental work done, tell your dentist you are  receiving this medicine. Do not become pregnant while taking this medicine or for 5 months after stopping it. Talk with your doctor or health care professional about your birth control options while taking this medicine. Women should inform their doctor if they wish to become pregnant or think they might be pregnant. There is a potential for serious side  effects to an unborn child. Talk to your health care professional or pharmacist for more information. What side effects may I notice from receiving this medicine? Side effects that you should report to your doctor or health care professional as soon as possible:  allergic reactions like skin rash, itching or hives, swelling of the face, lips, or tongue  bone pain  breathing problems  dizziness  jaw pain, especially after dental work  redness, blistering, peeling of the skin  signs and symptoms of infection like fever or chills; cough; sore throat; pain or trouble passing urine  signs of low calcium like fast heartbeat, muscle cramps or muscle pain; pain, tingling, numbness in the hands or feet; seizures  unusual bleeding or bruising  unusually weak or tired Side effects that usually do not require medical attention (report to your doctor or health care professional if they continue or are bothersome):  constipation  diarrhea  headache  joint pain  loss of appetite  muscle pain  runny nose  tiredness  upset stomach This list may not describe all possible side effects. Call your doctor for medical advice about side effects. You may report side effects to FDA at 1-800-FDA-1088. Where should I keep my medicine? This medicine is only given in a clinic, doctor's office, or other health care setting and will not be stored at home. NOTE: This sheet is a summary. It may not cover all possible information. If you have questions about this medicine, talk to your doctor, pharmacist, or health care provider.  2020 Elsevier/Gold Standard (2017-11-28 16:10:44)

## 2019-05-07 NOTE — Telephone Encounter (Signed)
Copied from [place] [phone]. Topic: Quick Communication - Rx Refill/Question >> May 07, 2019 12:24 PM Erick Blinks wrote: Message for PCP  -Nitofurantoin Monohyd MACRA 100 MG Antibiotic

## 2019-05-07 NOTE — Telephone Encounter (Signed)
Noted  TMS 

## 2019-05-11 DIAGNOSIS — M5136 Other intervertebral disc degeneration, lumbar region: Secondary | ICD-10-CM | POA: Diagnosis not present

## 2019-05-11 DIAGNOSIS — M47816 Spondylosis without myelopathy or radiculopathy, lumbar region: Secondary | ICD-10-CM | POA: Diagnosis not present

## 2019-05-11 DIAGNOSIS — M5416 Radiculopathy, lumbar region: Secondary | ICD-10-CM | POA: Diagnosis not present

## 2019-05-12 ENCOUNTER — Other Ambulatory Visit: Payer: Self-pay

## 2019-05-12 ENCOUNTER — Other Ambulatory Visit: Payer: Medicare HMO

## 2019-05-12 ENCOUNTER — Other Ambulatory Visit (INDEPENDENT_AMBULATORY_CARE_PROVIDER_SITE_OTHER): Payer: Medicare HMO

## 2019-05-12 DIAGNOSIS — N390 Urinary tract infection, site not specified: Secondary | ICD-10-CM

## 2019-05-13 ENCOUNTER — Telehealth: Payer: Self-pay

## 2019-05-13 NOTE — Telephone Encounter (Signed)
Copied from [place] [phone]. Topic: General - Other >> May 13, 2019  4:35 PM Pauline Good wrote: Reason for CRM: pt had a referral to urology in [place] but pt want to go to Dr Hollice Espy [facility] [place] please call [phone]. please call pt to advise

## 2019-05-14 ENCOUNTER — Other Ambulatory Visit: Payer: Self-pay | Admitting: Internal Medicine

## 2019-05-14 DIAGNOSIS — N390 Urinary tract infection, site not specified: Secondary | ICD-10-CM

## 2019-05-14 DIAGNOSIS — B9620 Unspecified Escherichia coli [E. coli] as the cause of diseases classified elsewhere: Secondary | ICD-10-CM

## 2019-05-14 LAB — URINE CULTURE
MICRO NUMBER:: 964260
SPECIMEN QUALITY:: ADEQUATE

## 2019-05-14 MED ORDER — NITROFURANTOIN MONOHYD MACRO 100 MG PO CAPS: 100.0000 mg | ORAL_CAPSULE | Freq: Two times a day (BID) | ORAL | 0 refills | Status: DC

## 2019-05-14 NOTE — Telephone Encounter (Signed)
Referral placed to Dr. Cherrie Gauze office at [facility]

## 2019-05-17 ENCOUNTER — Telehealth: Payer: Self-pay

## 2019-05-17 NOTE — Telephone Encounter (Signed)
Copied from [place] [id number]. Topic: General - Other >> May 17, 2019  8:52 AM Carolyn Stare wrote: Pt wou;ld like a call back about her Urine test

## 2019-05-17 NOTE — Telephone Encounter (Signed)
Please see lab results note  

## 2019-05-18 ENCOUNTER — Ambulatory Visit
Admission: RE | Admit: 2019-05-18 | Discharge: 2019-05-18 | Disposition: A | Discharge status: Home / Self Care | Payer: Medicare HMO | Source: Ambulatory Visit | Attending: Neurosurgery | Admitting: Neurosurgery

## 2019-05-18 ENCOUNTER — Other Ambulatory Visit: Payer: Self-pay | Admitting: Neurosurgery

## 2019-05-18 ENCOUNTER — Telehealth: Payer: Self-pay

## 2019-05-18 ENCOUNTER — Other Ambulatory Visit: Payer: Self-pay

## 2019-05-18 DIAGNOSIS — M4157 Other secondary scoliosis, lumbosacral region: Secondary | ICD-10-CM

## 2019-05-18 DIAGNOSIS — M4185 Other forms of scoliosis, thoracolumbar region: Secondary | ICD-10-CM | POA: Diagnosis not present

## 2019-05-18 DIAGNOSIS — M4802 Spinal stenosis, cervical region: Secondary | ICD-10-CM

## 2019-05-18 DIAGNOSIS — M5416 Radiculopathy, lumbar region: Secondary | ICD-10-CM | POA: Diagnosis not present

## 2019-05-18 DIAGNOSIS — M48061 Spinal stenosis, lumbar region without neurogenic claudication: Secondary | ICD-10-CM | POA: Diagnosis not present

## 2019-05-18 NOTE — Telephone Encounter (Signed)
Copied from [place] [phone]. Topic: Appointment Scheduling - Scheduling Inquiry for Clinic >> May 18, 2019  2:58 PM Reyne Dumas L wrote: Reason for CRM:  Pt calling because she does want the office to go ahead and work on getting her scheduled for a Prolia injection.

## 2019-05-18 NOTE — Telephone Encounter (Signed)
Wants prolia

## 2019-05-20 DIAGNOSIS — M5416 Radiculopathy, lumbar region: Secondary | ICD-10-CM | POA: Diagnosis not present

## 2019-05-20 DIAGNOSIS — M5136 Other intervertebral disc degeneration, lumbar region: Secondary | ICD-10-CM | POA: Diagnosis not present

## 2019-05-25 ENCOUNTER — Ambulatory Visit: Payer: Medicare HMO | Admitting: Internal Medicine

## 2019-05-27 ENCOUNTER — Telehealth: Payer: Self-pay | Admitting: *Deleted

## 2019-05-27 NOTE — Telephone Encounter (Signed)
Insurance verification for Prolia filed on [employer] Portal. 

## 2019-05-27 NOTE — Telephone Encounter (Signed)
-----   Message from Delorise Jackson, MD sent at 05/18/2019  4:11 PM EDT ----- Osteopenia with high FRAX score

## 2019-06-03 DIAGNOSIS — M4014 Other secondary kyphosis, thoracic region: Secondary | ICD-10-CM | POA: Diagnosis not present

## 2019-06-03 DIAGNOSIS — M419 Scoliosis, unspecified: Secondary | ICD-10-CM | POA: Diagnosis not present

## 2019-06-04 ENCOUNTER — Telehealth: Payer: Self-pay | Admitting: Internal Medicine

## 2019-06-04 ENCOUNTER — Other Ambulatory Visit: Payer: Self-pay | Admitting: Internal Medicine

## 2019-06-04 DIAGNOSIS — M8580 Other specified disorders of bone density and structure, unspecified site: Secondary | ICD-10-CM

## 2019-06-04 DIAGNOSIS — M810 Age-related osteoporosis without current pathological fracture: Secondary | ICD-10-CM

## 2019-06-04 NOTE — Telephone Encounter (Signed)
prolia is taking too long to get approved and she may need back surgery but Dr. Cari Caraway wants her on Forteo or Evenity x 3 months prior to surgery   Referring to endocrine Inform patient   [place]

## 2019-06-04 NOTE — Telephone Encounter (Signed)
Called and advised patient I have still not received approval but will advises as soon as approval received for Prolia.

## 2019-06-04 NOTE — Telephone Encounter (Signed)
Patient is requesting a call back from [facility] regarding this matter [phone]

## 2019-06-07 NOTE — Telephone Encounter (Signed)
Ok Perfecto [place] is made by same company that makes Prolia its the same process. I do not Know about Forteo  So let me check the [employer] portal today I will let you know.

## 2019-06-07 NOTE — Telephone Encounter (Signed)
Thank you :)

## 2019-06-07 NOTE — Telephone Encounter (Signed)
I also referred to Endocrine to address if we cant get approved   Thanks [place]

## 2019-06-07 NOTE — Telephone Encounter (Signed)
Evenity takes prior approval and advised Kimberly Cook I would know something this week I do not know about forteo .

## 2019-06-08 ENCOUNTER — Telehealth: Payer: Self-pay

## 2019-06-08 ENCOUNTER — Encounter: Payer: Self-pay | Admitting: Urology

## 2019-06-08 ENCOUNTER — Ambulatory Visit (INDEPENDENT_AMBULATORY_CARE_PROVIDER_SITE_OTHER): Payer: Medicare HMO | Admitting: Urology

## 2019-06-08 ENCOUNTER — Other Ambulatory Visit: Payer: Self-pay

## 2019-06-08 ENCOUNTER — Encounter: Payer: Self-pay | Admitting: *Deleted

## 2019-06-08 VITALS — BP 148/74 | HR 82 | Ht 63.0 in | Wt 170.0 lb

## 2019-06-08 DIAGNOSIS — N390 Urinary tract infection, site not specified: Secondary | ICD-10-CM

## 2019-06-08 DIAGNOSIS — N3941 Urge incontinence: Secondary | ICD-10-CM

## 2019-06-08 DIAGNOSIS — R3915 Urgency of urination: Secondary | ICD-10-CM

## 2019-06-08 DIAGNOSIS — Z8619 Personal history of other infectious and parasitic diseases: Secondary | ICD-10-CM

## 2019-06-08 DIAGNOSIS — K5909 Other constipation: Secondary | ICD-10-CM

## 2019-06-08 LAB — URINALYSIS, COMPLETE
Bilirubin, UA: NEGATIVE
Glucose, UA: NEGATIVE
Ketones, UA: NEGATIVE
Nitrite, UA: POSITIVE — AB
Protein,UA: NEGATIVE
Specific Gravity, UA: 1.02 (ref 1.005–1.030)
Urobilinogen, Ur: 0.2 mg/dL (ref 0.2–1.0)
pH, UA: 5.5 (ref 5.0–7.5)

## 2019-06-08 LAB — BLADDER SCAN AMB NON-IMAGING

## 2019-06-08 LAB — MICROSCOPIC EXAMINATION: WBC, UA: >30 /hpf — AB (ref 0–5)

## 2019-06-08 MED ORDER — FOSFOMYCIN TROMETHAMINE 3 G PO PACK: 3.0000 g | PACK | Freq: Once | ORAL | 0 refills | Status: AC

## 2019-06-08 MED ORDER — FOSFOMYCIN TROMETHAMINE 3 G PO PACK: 3.0000 g | PACK | Freq: Once | ORAL | Status: DC

## 2019-06-08 NOTE — Progress Notes (Signed)
06/08/2019 1:08 PM   Kimberly ClinesBrenda T Cook Feb 28, 1946 [id number]  Referring provider: McLean-Scocuzza, Pasty Spillersracy N, MD [address] [place],  [place] [id number]  Chief Complaint  Patient presents with   Recurrent UTI    HPI: 73 year old female seen and evaluated today for recurrent urinary tract infections and chronic urinary symptoms.  Recently, she was seen and evaluated in the emergency room on 04/29/2019 with low back pain and tingling down towards her legs.  She also was constipated the time.  Her urine was checked and incidentally positive although she had no urinary symptoms.  Urine culture grew E. coli resistant to amoxicillin, ampicillin, cefazolin, Levaquin, Cipro, and Bactrim (ESBL).  She is grown E. coli a total of 3 times over the past year.  Her initial culture from 06/2018 grew ESBL and since, she is had E. coli species with variable resistance pattern.  She does occasionally have issues with constipation.  She reports that she will have a bowel movement 1 day, skip the next day and the following day will have some frequent smaller stools.  She does not take anything regularly for bowel regimen.  She reports that she had a bladder "repair in 2004 for unclear reasons.  She believes her bladder was repositioned because it had dropped.  She does not recall mesh being used.  She reports that ever since then, she is had issues with urgency, frequency and difficulty getting to the bathroom on time.  Today, she reports that over the past several years, she is had almost constant burning.  She will respond to antibiotics for few days but that her symptoms quickly returned.  She reports that the burning is terminally near the end of urination.  She also has baseline fairly significant urinary frequency with urgency in the daytime.  She often has episodes where she cannot get to the bathroom in time and has large volume incontinence.  She gets up 3-4 times at night to void.  These urgency  frequency symptoms have been going on for at least 10+ years.  She is not tried any medications for this.  No real exacerbating or alleviating symptoms.  No flank pain.  No dysuria.  No admissions for urinary tract infections or pyelonephritis.  No fevers associated with her infections.  She is not sexually active.  She does not douche but does clean herself on a daily basis with a soapy rag inside of the vagina.  She drinks cranberry juice in the mornings.  No probiotic use.  PMH: Past Medical History:  Diagnosis Date   Allergy    Arthritis    Coronary artery disease 04/2017   Mild to moderate CAD in LAD/diagonal by CTA (CT-FFR of apical LAD 0.79).   Depression    Diverticulosis    Essential hypertension    Normal cardiolite 05/2006 EF 71%   GERD (gastroesophageal reflux disease)    Headache    History of shingles    Hyperlipidemia    Hypothyroidism    Mini stroke (HCC) 2011    Occipital neuralgia    Prediabetes    Stroke [facility](HCC)    Stroke (HCC)    MRI 04/2008 + left sup. frontal gyrus possibly puntate infarct    Urinary tract infection    Vitamin D deficiency     Surgical History: Past Surgical History:  Procedure Laterality Date   ABDOMINAL HYSTERECTOMY     BLADDER SURGERY     2003   BREAST EXCISIONAL BIOPSY Right Over 20 years  Benign   CHOLECYSTECTOMY     gastroplication      JOINT REPLACEMENT Left    KNEE ARTHROSCOPY Left 2011   TONSILLECTOMY AND ADENOIDECTOMY     TOTAL KNEE ARTHROPLASTY Left 04/17/2015   Procedure: LEFT TOTAL KNEE ARTHROPLASTY;  Surgeon: Durene Romans, MD;  Location: [facility];  Service: Orthopedics;  Laterality: Left;    Home Medications:  Allergies as of 06/08/2019   No Known Allergies     Medication List       Accurate as of June 08, 2019  1:08 PM. If you have any questions, ask your nurse or doctor.        aspirin EC 81 MG tablet Take 1 tablet (81 mg total) by mouth daily.   atorvastatin 20 MG  tablet Commonly known as: LIPITOR Take 1 tablet (20 mg total) by mouth daily.   Biotin 1 MG Caps Take 1 mg by mouth daily.   cetirizine 5 MG tablet Commonly known as: ZYRTEC Take 1-2 tablets (5-10 mg total) by mouth at bedtime as needed for allergies.   diclofenac sodium 1 % Gel Commonly known as: Voltaren 2 grams qid to upper body and 4 grams qid to lower body prn   diltiazem 60 MG 12 hr capsule Commonly known as: CARDIZEM SR Take 1 capsule (60 mg total) by mouth 2 (two) times daily.   escitalopram 5 MG tablet Commonly known as: LEXAPRO Take 1 tablet (5 mg total) by mouth daily.   fluticasone 50 MCG/ACT nasal spray Commonly known as: FLONASE Place 2 sprays into both nostrils daily as needed for allergies or rhinitis. Use after nasal saline   folic acid 400 MCG tablet Commonly known as: FOLVITE Take 1 tablet (400 mcg total) by mouth daily. SEPARATE ALL SUPPLEMENTS TO LUNCH OR DINNER AND PRILOSEC NOT TO MESS W/THYROID MED   fosfomycin 3 g Pack Commonly known as: MONUROL Take 3 g by mouth once for 1 dose. Mix in 4 ounces of water and Administer immediately Started by: Vanna Scotland, MD   gabapentin 100 MG capsule Commonly known as: NEURONTIN 1 pill in the am and 2 pills qhs   hydrALAZINE 10 MG tablet Commonly known as: APRESOLINE Take 2 tablets (20 mg total) by mouth 2 (two) times daily as needed. BP>140/>90   hydrochlorothiazide 12.5 MG capsule Commonly known as: Microzide Take 1 capsule (12.5 mg total) by mouth daily. In am   levothyroxine 88 MCG tablet Commonly known as: SYNTHROID Take 1 tablet (88 mcg total) by mouth daily before breakfast. Skip sundays   LORazepam 0.5 MG tablet Commonly known as: Ativan Take 1 tablet (0.5 mg total) by mouth 2 (two) times daily as needed for anxiety or sleep. Or 1 mg at night for sleep   montelukast 10 MG tablet Commonly known as: SINGULAIR Take 1 tablet (10 mg total) by mouth at bedtime.   mupirocin ointment 2  % Commonly known as: Bactroban Apply 1 application topically 2 (two) times daily. Right lower leg   nitrofurantoin (macrocrystal-monohydrate) 100 MG capsule Commonly known as: Macrobid Take 1 capsule (100 mg total) by mouth 2 (two) times daily. With food stop other antibiotics   omeprazole 40 MG capsule Commonly known as: PRILOSEC Take 1 capsule (40 mg total) by mouth daily.   sodium chloride 0.65 % Soln nasal spray Commonly known as: OCEAN Place 2 sprays into both nostrils as needed for congestion. Use 1st   tiZANidine 4 MG tablet Commonly known as: ZANAFLEX Take 1 tablet (4 mg total)  by mouth 2 (two) times daily as needed for muscle spasms.   traZODone 50 MG tablet Commonly known as: DESYREL Take 1-2 tablets (50-100 mg total) by mouth at bedtime as needed for sleep. 1 hr before bed   vitamin B-12 1000 MCG tablet Commonly known as: CYANOCOBALAMIN Take 1 tablet (1,000 mcg total) by mouth daily.   Vitamin D3 125 MCG (5000 UT) Tabs Take 1 tablet (5,000 Units total) by mouth daily.       Allergies: No Known Allergies  Family History: Family History  Problem Relation Age of Onset   Heart disease Mother    Hypertension Mother    Diabetes Mother    Heart attack Mother 36   Heart disease Father    Heart attack Father 52   Breast cancer Maternal Aunt    Heart attack Brother     Social History:  reports that she is a non-smoker but has been exposed to tobacco smoke. She has never used smokeless tobacco. She reports that she does not drink alcohol or use drugs.  ROS: UROLOGY Frequent Urination?: Yes Hard to postpone urination?: Yes Burning/pain with urination?: Yes Get up at night to urinate?: Yes Leakage of urine?: Yes Urine stream starts and stops?: No Trouble starting stream?: Yes Do you have to strain to urinate?: No Blood in urine?: No Urinary tract infection?: Yes Sexually transmitted disease?: No Injury to kidneys or bladder?: No Painful  intercourse?: No Weak stream?: No Currently pregnant?: No Vaginal bleeding?: No Last menstrual period?: N  Gastrointestinal Nausea?: No Vomiting?: No Indigestion/heartburn?: No Diarrhea?: No Constipation?: No  Constitutional Fever: No Night sweats?: No Weight loss?: No Fatigue?: No  Skin Skin rash/lesions?: No Itching?: No  Eyes Blurred vision?: No Double vision?: No  Ears/Nose/Throat Sore throat?: No Sinus problems?: No  Hematologic/Lymphatic Swollen glands?: No Easy bruising?: No  Cardiovascular Leg swelling?: No Chest pain?: No  Respiratory Cough?: No Shortness of breath?: No  Endocrine Excessive thirst?: No  Musculoskeletal Back pain?: No Joint pain?: No  Neurological Headaches?: No Dizziness?: No  Psychologic Depression?: No Anxiety?: No  Physical Exam: BP (!) 148/74    Pulse 82    Ht 5\' 3"  (1.6 m)    Wt 170 lb (77.1 kg)    BMI 30.11 kg/m   Constitutional:  Alert and oriented, No acute distress. HEENT: [place] AT, moist mucus membranes.  Trachea midline, no masses. Cardiovascular: No clubbing, cyanosis, or edema. Respiratory: Normal respiratory effort, no increased work of breathing. GI: Abdomen is soft, nontender, nondistended, no abdominal masses Skin: No rashes, bruises or suspicious lesions. Neurologic: Grossly intact, no focal deficits, moving all 4 extremities. Psychiatric: Normal mood and affect.  Laboratory Data: Lab Results  Component Value Date   WBC 5.5 10/09/2018   HGB 14.0 10/09/2018   HCT 40.4 10/09/2018   MCV 92.4 10/09/2018   PLT 177.0 10/09/2018    Lab Results  Component Value Date   CREATININE 0.96 10/09/2018    Lab Results  Component Value Date   HGBA1C 5.0 05/19/2018    Urinalysis UA today with WBCs, many bacteria and nitrate positive.  See epic for details.  Pertinent Imaging: Results for orders placed during the hospital encounter of 01/22/19  US Renal   Narrative CLINICAL DATA:  Renal  cyst  EXAM: RENAL / URINARY TRACT ULTRASOUND COMPLETE  COMPARISON:  MRI 01/15/2019, ultrasound 12/26/2017  FINDINGS: Right Kidney:  Renal measurements: 9.3 x 3.6 x 4.6 cm = volume: 80 mL. Cortical echogenicity normal. No hydronephrosis.  Left  Kidney:  Renal measurements: 9.4 x 4.1 x 4.7 cm = volume: 93.8 mL. Cortical echogenicity normal. No hydronephrosis. Midpole cyst measuring 1.8 cm.  Bladder:  Appears normal for degree of bladder distention.  IMPRESSION: 1. 1.8 cm cyst in the midpole left kidney, stable slight decrease in size as compared with 12/26/2017 ultrasound 2. The kidneys are otherwise normal in ultrasound appearance.   Electronically Signed   By: Jasmine Pang M.D.   On: 01/23/2019 20:18    Results for orders placed or performed in visit on 06/08/19  Bladder Scan (Post Void Residual) in office  Result Value Ref Range   Scan Result 55ml     Assessment & Plan:    1. Recurrent UTI Unclear based on patient's history whether or not she is symptomatic from recurrent or/incompletely treated urinary tract infections or if she is chronically colonized.  She has grown ESBL E. coli in the recent past and treated with Macrobid which may or may not have completely clear the infection.  Given the chronicity of her symptoms, is also unclear whether or not her symptoms are in fact related to this particular bacteria whether in fact she is chronically colonized with overlying OAB/urge incontinence symptoms.  We discussed today that we will go ahead and try to prove clearance of this bacteria and then reassess whether or not her symptoms improve significantly in order to determine whether or not this is an incidental finding or contributing factor.  Given the fairly virulent strain in the recent past, we will go ahead and treat her with a dose of oral fosfomycin and have her return in about 2 weeks for catheterized specimen to prove eradication.  She is agreeable this  plan.  If symptoms fail to resolve, she may benefit from topical estrogen cream and/or further evaluation with cystoscopy.  In addition see above, we will go ahead and have her start probiotics as well as cranberry tablets twice daily and forego cranberry juice.  - Urinalysis, Complete - Bladder Scan (Post Void Residual) in office - CULTURE, URINE COMPREHENSIVE  2. History of ESBL E. coli infection As above  3. Urinary urgency Unclear whether or not this is related to [place] OAB versus contributing factor as above.  We will go ahead and try to treat her symptoms with samples of Myrbetriq 25 mg x 4 weeks.  Reassess symptoms when she returns.  We discussed possible side effects including elevation of blood pressure.  Prefer to avoid anticholinergics given her history of severe dry mouth at baseline.  4. Urge incontinence As above  5. Chronic constipation We discussed the relationship between bowel and bladder.  Given that she has a tendency towards constipation, I recommended that she initiate a bowel regimen.   Return in about 2 weeks (around 06/22/2019) for Sam for pelvic with cathed UA.  Vanna Scotland, MD  [facility] [address] [place], [place] [address] [phone]

## 2019-06-08 NOTE — Telephone Encounter (Signed)
Ok to try evenity   Thanks [employer]

## 2019-06-08 NOTE — Progress Notes (Signed)
Monurol prior authorization submitted via covermymeds.  Waiting response.

## 2019-06-08 NOTE — Telephone Encounter (Signed)
Authorization in process

## 2019-06-08 NOTE — Patient Instructions (Addendum)
Please start the following:  1) Colace 100 mg one to two times daily  2) Daily probiotic supplement  3) Cranberry tablets twice daily   All of these are over the counter.  You will also get a prescription for an antibiotic.  You were also given samples of Myrbetriq.    We will see you again in 2 weeks.

## 2019-06-08 NOTE — Telephone Encounter (Signed)
Insurance denied initial request for Prolia, called insurance initiated direct appeal Case# [id number], appeal request marked urgent.. 24 hour result will be given.

## 2019-06-08 NOTE — Telephone Encounter (Signed)
Started authorization for [employer]

## 2019-06-08 NOTE — Telephone Encounter (Signed)
Called notified patient that insurance had denied our first attempt for Prolia , but that I had started andvexpediated appeal, and that we had also started the process for evenitity.

## 2019-06-08 NOTE — Telephone Encounter (Signed)
Copied from [place] [id number]. Topic: General - Other >> Jun 08, 2019  3:05 PM Celene Kras A wrote: Reason for CRM: Pt called and is requesting an update on her osteopetrosis shot. Please advise.

## 2019-06-09 ENCOUNTER — Other Ambulatory Visit: Payer: Self-pay | Admitting: Internal Medicine

## 2019-06-09 ENCOUNTER — Encounter: Payer: Self-pay | Admitting: Internal Medicine

## 2019-06-09 DIAGNOSIS — M5126 Other intervertebral disc displacement, lumbar region: Secondary | ICD-10-CM | POA: Insufficient documentation

## 2019-06-09 DIAGNOSIS — M5136 Other intervertebral disc degeneration, lumbar region: Secondary | ICD-10-CM | POA: Insufficient documentation

## 2019-06-09 DIAGNOSIS — I10 Essential (primary) hypertension: Secondary | ICD-10-CM

## 2019-06-10 DIAGNOSIS — M48062 Spinal stenosis, lumbar region with neurogenic claudication: Secondary | ICD-10-CM | POA: Diagnosis not present

## 2019-06-10 DIAGNOSIS — M5416 Radiculopathy, lumbar region: Secondary | ICD-10-CM | POA: Diagnosis not present

## 2019-06-10 DIAGNOSIS — M5136 Other intervertebral disc degeneration, lumbar region: Secondary | ICD-10-CM | POA: Diagnosis not present

## 2019-06-10 LAB — CULTURE, URINE COMPREHENSIVE

## 2019-06-22 ENCOUNTER — Other Ambulatory Visit: Payer: Self-pay

## 2019-06-22 ENCOUNTER — Ambulatory Visit (INDEPENDENT_AMBULATORY_CARE_PROVIDER_SITE_OTHER): Payer: Medicare HMO | Admitting: Physician Assistant

## 2019-06-22 ENCOUNTER — Encounter: Payer: Self-pay | Admitting: Physician Assistant

## 2019-06-22 VITALS — BP 148/72 | HR 80 | Ht 63.0 in | Wt 170.0 lb

## 2019-06-22 DIAGNOSIS — N390 Urinary tract infection, site not specified: Secondary | ICD-10-CM

## 2019-06-22 LAB — URINALYSIS, COMPLETE
Bilirubin, UA: NEGATIVE
Glucose, UA: NEGATIVE
Ketones, UA: NEGATIVE
Nitrite, UA: POSITIVE — AB
Protein,UA: NEGATIVE
Specific Gravity, UA: 1.02 (ref 1.005–1.030)
Urobilinogen, Ur: 0.2 mg/dL (ref 0.2–1.0)
pH, UA: 5.5 (ref 5.0–7.5)

## 2019-06-22 LAB — MICROSCOPIC EXAMINATION
Epithelial Cells (non renal): NONE SEEN /hpf (ref 0–10)
RBC, Urine: NONE SEEN /hpf (ref 0–2)

## 2019-06-22 NOTE — Progress Notes (Signed)
06/22/2019 12:09 PM   Kimberly Cook April 05, 1946 [id number]  CC: Recurrent UTI  HPI: Kimberly Cook is a 73 y.o. female who presents today for follow-up of recurrent UTI. She is an established [facility] patient who last saw Dr. Erlene Quan on 06/08/2019 for recurrent UTI.  She was prescribed fosfomycin x1 dose at that visit.  She returns today for a catheter UA sample to prove clearance of infection.  In the interim, she was advised to start daily probiotics, twice daily cranberry supplementation, Myrbetriq 25 mg daily, and a bowel regimen.  In the interim, she reports having taken her prescribed fosfomycin and initiated daily probiotics, cranberry supplementation, Myrbetriq, and a bowel regimen of Colace twice daily.  She reports tolerating these interventions well and thinks that they have improved her symptoms.  As result, she reports resolution of her dysuria.  She reports continued urinary urgency, however no urge incontinence.  She continues to feel that she is straining to fully empty.  She continues to experience nocturia x1-4.  In-office catheterized UA today positive for trace-intact blood, nitrites, and 2+ leukocyte esterase; urine microscopy with >30 WBCs/HPF and many bacteria.  PMH: Past Medical History:  Diagnosis Date  . Allergy   . Arthritis   . Coronary artery disease 04/2017   Mild to moderate CAD in LAD/diagonal by CTA (CT-FFR of apical LAD 0.79).  . Depression   . Diverticulosis   . Essential hypertension    Normal cardiolite 05/2006 EF 71%  . GERD (gastroesophageal reflux disease)   . Headache   . History of shingles   . Hyperlipidemia   . Hypothyroidism   . Mini stroke ([place]) 2011   . Occipital neuralgia   . Prediabetes   . Stroke ([place])   . Stroke [facility])    MRI 04/2008 + left sup. frontal gyrus possibly puntate infarct   . Urinary tract infection   . Vitamin D deficiency     Surgical History: Past Surgical History:  Procedure Laterality Date  . ABDOMINAL  HYSTERECTOMY    . BLADDER SURGERY     2003  . BREAST EXCISIONAL BIOPSY Right Over 20 years    Benign  . CHOLECYSTECTOMY    . gastroplication     . JOINT REPLACEMENT Left   . KNEE ARTHROSCOPY Left 2011  . TONSILLECTOMY AND ADENOIDECTOMY    . TOTAL KNEE ARTHROPLASTY Left 04/17/2015   Procedure: LEFT TOTAL KNEE ARTHROPLASTY;  Surgeon: Paralee Cancel, MD;  Location: [facility];  Service: Orthopedics;  Laterality: Left;    Home Medications:  Allergies as of 06/22/2019   No Known Allergies     Medication List       Accurate as of June 22, 2019 11:59 PM. If you have any questions, ask your nurse or doctor.        aspirin EC 81 MG tablet Take 1 tablet (81 mg total) by mouth daily.   atorvastatin 20 MG tablet Commonly known as: LIPITOR Take 1 tablet (20 mg total) by mouth daily.   Biotin 1 MG Caps Take 1 mg by mouth daily.   cetirizine 5 MG tablet Commonly known as: ZYRTEC Take 1-2 tablets (5-10 mg total) by mouth at bedtime as needed for allergies.   diclofenac sodium 1 % Gel Commonly known as: Voltaren 2 grams qid to upper body and 4 grams qid to lower body prn   diltiazem 60 MG 12 hr capsule Commonly known as: CARDIZEM SR Take 1 capsule (60 mg total) by mouth 2 (two) times  daily.   escitalopram 5 MG tablet Commonly known as: LEXAPRO Take 1 tablet (5 mg total) by mouth daily.   fluticasone 50 MCG/ACT nasal spray Commonly known as: FLONASE Place 2 sprays into both nostrils daily as needed for allergies or rhinitis. Use after nasal saline   folic acid 400 MCG tablet Commonly known as: FOLVITE Take 1 tablet (400 mcg total) by mouth daily. SEPARATE ALL SUPPLEMENTS TO LUNCH OR DINNER AND PRILOSEC NOT TO MESS W/THYROID MED   gabapentin 100 MG capsule Commonly known as: NEURONTIN 1 pill in the am and 2 pills qhs   hydrALAZINE 10 MG tablet Commonly known as: APRESOLINE Take 2 tablets (20 mg total) by mouth 2 (two) times daily as needed. BP>140/>90    hydrochlorothiazide 12.5 MG capsule Commonly known as: Microzide Take 1 capsule (12.5 mg total) by mouth daily. In am   levothyroxine 88 MCG tablet Commonly known as: SYNTHROID Take 1 tablet (88 mcg total) by mouth daily before breakfast. Skip sundays   LORazepam 0.5 MG tablet Commonly known as: Ativan Take 1 tablet (0.5 mg total) by mouth 2 (two) times daily as needed for anxiety or sleep. Or 1 mg at night for sleep   montelukast 10 MG tablet Commonly known as: SINGULAIR Take 1 tablet (10 mg total) by mouth at bedtime.   mupirocin ointment 2 % Commonly known as: Bactroban Apply 1 application topically 2 (two) times daily. Right lower leg   nitrofurantoin (macrocrystal-monohydrate) 100 MG capsule Commonly known as: Macrobid Take 1 capsule (100 mg total) by mouth 2 (two) times daily. With food stop other antibiotics   omeprazole 40 MG capsule Commonly known as: PRILOSEC Take 1 capsule (40 mg total) by mouth daily.   sodium chloride 0.65 % Soln nasal spray Commonly known as: OCEAN Place 2 sprays into both nostrils as needed for congestion. Use 1st   tiZANidine 4 MG tablet Commonly known as: ZANAFLEX Take 1 tablet (4 mg total) by mouth 2 (two) times daily as needed for muscle spasms.   traZODone 50 MG tablet Commonly known as: DESYREL Take 1-2 tablets (50-100 mg total) by mouth at bedtime as needed for sleep. 1 hr before bed   vitamin B-12 1000 MCG tablet Commonly known as: CYANOCOBALAMIN Take 1 tablet (1,000 mcg total) by mouth daily.   Vitamin D3 125 MCG (5000 UT) Tabs Take 1 tablet (5,000 Units total) by mouth daily.      Allergies:  No Known Allergies  Family History: Family History  Problem Relation Age of Onset  . Heart disease Mother   . Hypertension Mother   . Diabetes Mother   . Heart attack Mother 52  . Heart disease Father   . Heart attack Father 33  . Breast cancer Maternal Aunt   . Heart attack Brother    Social History:   reports that she  is a non-smoker but has been exposed to tobacco smoke. She has never used smokeless tobacco. She reports that she does not drink alcohol or use drugs.  ROS: UROLOGY Frequent Urination?: Yes Hard to postpone urination?: No Burning/pain with urination?: No Leakage of urine?: Yes Urine stream starts and stops?: No Trouble starting stream?: No Do you have to strain to urinate?: No Blood in urine?: No Urinary tract infection?: No Sexually transmitted disease?: No Injury to kidneys or bladder?: No Painful intercourse?: No Weak stream?: No Currently pregnant?: No Vaginal bleeding?: No Last menstrual period?: n  Gastrointestinal Nausea?: No Vomiting?: No Indigestion/heartburn?: No Diarrhea?: No  Constitutional  Fever: No Night sweats?: No Weight loss?: No Fatigue?: No  Skin Skin rash/lesions?: No Itching?: No  Eyes Blurred vision?: No Double vision?: No  Ears/Nose/Throat Sore throat?: No Sinus problems?: No  Hematologic/Lymphatic Swollen glands?: No Easy bruising?: No  Cardiovascular Leg swelling?: No Chest pain?: No  Respiratory Cough?: No Shortness of breath?: No  Endocrine Excessive thirst?: No  Musculoskeletal Back pain?: No Joint pain?: No  Neurological Headaches?: No Dizziness?: No  Psychologic Depression?: No Anxiety?: No  Physical Exam: BP (!) 148/72   Pulse 80   Ht 5\' 3"  (1.6 m)   Wt 170 lb (77.1 kg)   BMI 30.11 kg/m   Constitutional:  Alert and oriented, no acute distress, nontoxic appearing HEENT: [place], AT Cardiovascular: No clubbing, cyanosis, or edema Respiratory: Normal respiratory effort, no increased work of breathing GU: Dry, glossy, thin appearance of the vaginal tissue and labia Skin: No rashes, bruises or suspicious lesions Neurologic: Grossly intact, no focal deficits, moving all 4 extremities Psychiatric: Normal mood and affect  Laboratory Data: Results for orders placed or performed in visit on 06/22/19  Microscopic  Examination   URINE  Result Value Ref Range   WBC, UA >30W 0 - 5 /hpf   RBC None seen 0 - 2 /hpf   Epithelial Cells (non renal) None seen 0 - 10 /hpf   Bacteria, UA Many (A) None seen/Few  Urinalysis, Complete  Result Value Ref Range   Specific Gravity, UA 1.020 1.005 - 1.030   pH, UA 5.5 5.0 - 7.5   Color, UA Yellow Yellow   Appearance Ur Cloudy (A) Clear   Leukocytes,UA 2+ (A) Negative   Protein,UA Negative Negative/Trace   Glucose, UA Negative Negative   Ketones, UA Negative Negative   RBC, UA Trace (A) Negative   Bilirubin, UA Negative Negative   Urobilinogen, Ur 0.2 0.2 - 1.0 mg/dL   Nitrite, UA Positive (A) Negative   Microscopic Examination See below:    Assessment & Plan:   1. Recurrent UTI 73 year old female with a history of recurrent UTI with ESBL E. coli here for follow-up cath UA to prove infection resolution following 1 dose of fosfomycin.  Patient is started probiotics, cranberry supplementation, Myrbetriq, and a bowel regimen in the interim and reports significant symptom improvement including resolution of dysuria.  UA looks suspicious, however she is clinically improved on her new regimen.  I suspect her symptoms are consistent with asymptomatic bacteriuria with superimposed OAB.  Will not treat for positive UA today.  Advised patient to return to clinic if she develops dysuria for repeat UA and culture at that time.  Patient with signs of atrophic vaginitis on physical exam today.  I would like to add topical vaginal estrogen cream to her regimen.  Rx sent to her pharmacy. I contacted patient via telephone to inform her.  She expressed understanding. - Urinalysis, Complete - Microscopic Examination - conjugated estrogens (PREMARIN) vaginal cream; Place 1 Applicatorful vaginally 3 (three) times a week.  Dispense: 30 g; Refill: 0   Return in 3 months (on 09/22/2019) for rUTI and OAB follow-up.  Carman Ching, PA-C  [facility] [address] [place], [place] [address] [phone]

## 2019-06-23 MED ORDER — PREMARIN 0.625 MG/GM VA CREA: 1.0000 | TOPICAL_CREAM | VAGINAL | 0 refills | Status: DC

## 2019-07-06 DIAGNOSIS — M47816 Spondylosis without myelopathy or radiculopathy, lumbar region: Secondary | ICD-10-CM | POA: Diagnosis not present

## 2019-07-06 DIAGNOSIS — M5416 Radiculopathy, lumbar region: Secondary | ICD-10-CM | POA: Diagnosis not present

## 2019-07-06 DIAGNOSIS — M19071 Primary osteoarthritis, right ankle and foot: Secondary | ICD-10-CM | POA: Diagnosis not present

## 2019-07-06 DIAGNOSIS — M5136 Other intervertebral disc degeneration, lumbar region: Secondary | ICD-10-CM | POA: Diagnosis not present

## 2019-07-06 DIAGNOSIS — M48062 Spinal stenosis, lumbar region with neurogenic claudication: Secondary | ICD-10-CM | POA: Diagnosis not present

## 2019-07-07 ENCOUNTER — Telehealth: Payer: Self-pay | Admitting: Physician Assistant

## 2019-07-07 MED ORDER — MIRABEGRON ER 25 MG PO TB24: 25.0000 mg | ORAL_TABLET | Freq: Every day | ORAL | 2 refills | Status: DC

## 2019-07-07 NOTE — Telephone Encounter (Signed)
Patient is requesting Myrbetriq sent to pharmacy. I do not see where Myrbetriq samples were ordered. In the last note is states she has started [employer]. Would you like her to continue this medication and at what dose?

## 2019-07-07 NOTE — Telephone Encounter (Signed)
Dr. Erlene Quan started her on Myrbetriq 25 mg daily about 1 month ago.  She is okay to continue this.  Prescription for Myrbetriq 25 mg x 1 month with 2 refills sent to her pharmacy.

## 2019-07-07 NOTE — Telephone Encounter (Signed)
Pt called and needs an RX for Myrbetriq called into [facility].

## 2019-07-09 ENCOUNTER — Other Ambulatory Visit: Payer: Self-pay

## 2019-07-09 ENCOUNTER — Other Ambulatory Visit: Payer: Self-pay | Admitting: Internal Medicine

## 2019-07-09 DIAGNOSIS — I10 Essential (primary) hypertension: Secondary | ICD-10-CM

## 2019-07-09 DIAGNOSIS — G4700 Insomnia, unspecified: Secondary | ICD-10-CM

## 2019-07-09 DIAGNOSIS — E782 Mixed hyperlipidemia: Secondary | ICD-10-CM

## 2019-07-09 DIAGNOSIS — F419 Anxiety disorder, unspecified: Secondary | ICD-10-CM

## 2019-07-09 MED ORDER — ATORVASTATIN CALCIUM 20 MG PO TABS: 20.0000 mg | ORAL_TABLET | Freq: Every day | ORAL | 3 refills | Status: DC

## 2019-07-09 MED ORDER — LORAZEPAM 0.5 MG PO TABS: 0.5000 mg | ORAL_TABLET | Freq: Two times a day (BID) | ORAL | 5 refills | Status: DC | PRN

## 2019-07-09 MED ORDER — HYDROCHLOROTHIAZIDE 12.5 MG PO CAPS: 12.5000 mg | ORAL_CAPSULE | Freq: Every day | ORAL | 3 refills | Status: DC

## 2019-07-14 DIAGNOSIS — M539 Dorsopathy, unspecified: Secondary | ICD-10-CM | POA: Diagnosis not present

## 2019-07-14 DIAGNOSIS — M810 Age-related osteoporosis without current pathological fracture: Secondary | ICD-10-CM | POA: Diagnosis not present

## 2019-07-14 DIAGNOSIS — K219 Gastro-esophageal reflux disease without esophagitis: Secondary | ICD-10-CM | POA: Diagnosis not present

## 2019-07-22 ENCOUNTER — Other Ambulatory Visit: Payer: Self-pay

## 2019-07-22 ENCOUNTER — Ambulatory Visit (INDEPENDENT_AMBULATORY_CARE_PROVIDER_SITE_OTHER): Payer: Medicare HMO

## 2019-07-22 VITALS — Ht 63.0 in | Wt 170.0 lb

## 2019-07-22 DIAGNOSIS — Z0000 Encounter for general adult medical examination without abnormal findings: Secondary | ICD-10-CM

## 2019-07-22 NOTE — Patient Instructions (Addendum)
  Kimberly Cook , Thank you for taking time to come for your Medicare Wellness Visit. I appreciate your ongoing commitment to your health goals. Please review the following plan we discussed and let me know if I can assist you in the future.   These are the goals we discussed: Goals      Patient Stated   . Blood Pressure < 140/90 (pt-stated)     Monitor       This is a list of the screening recommended for you and due dates:  Health Maintenance  Topic Date Due  . Mammogram  04/13/2021  . Colon Cancer Screening  06/02/2021  . Tetanus Vaccine  09/25/2025  . Flu Shot  Completed  . DEXA scan (bone density measurement)  Completed  .  Hepatitis C: One time screening is recommended by Center for Disease Control  (CDC) for  adults born from [birth year] through 1966.   Completed  . Pneumonia vaccines  Completed  . Complete foot exam   Discontinued  . Hemoglobin A1C  Discontinued  . Eye exam for diabetics  Discontinued

## 2019-07-22 NOTE — Progress Notes (Signed)
Subjective:   Kimberly Cook is a 73 y.o. female who presents for Medicare Annual (Subsequent) preventive examination.  Review of Systems:  No ROS.  Medicare Wellness Virtual Visit.  Visual/audio telehealth visit, UTA vital signs.  Ht/wt transferred from previous visit.  See social history for additional risk factors.   Cardiac Risk Factors include: advanced age (>21men, >12 women);hypertension     Objective:     Vitals: Ht 5\' 3"  (1.6 m)   Wt 170 lb (77.1 kg)   BMI 30.11 kg/m   Body mass index is 30.11 kg/m.  Advanced Directives 07/22/2019 04/29/2019 07/17/2018 06/30/2018 04/19/2018 04/02/2016 12/20/2015  Does Patient Have a Medical Advance Directive? Yes Yes Yes No Yes Yes Yes  Type of 12/22/2015 of [place];Living will Living will;Healthcare Power of [place] Power of [place];Living will - Living will;Healthcare Power of [place] Power of [place];Living will Healthcare Power of [place];Living will  Does patient want to make changes to medical advance directive? No - Patient declined No - Patient declined No - Patient declined - No - Patient declined No - Patient declined No - Patient declined  Copy of Healthcare Power of Attorney in Chart? No - copy requested No - copy requested Yes - validated most recent copy scanned in chart (See row information) - No - copy requested Yes Yes  Would patient like information on creating a medical advance directive? - - - No - Patient declined No - Patient declined - -    Tobacco Social History   Tobacco Use  Smoking Status Passive Smoke Exposure - Never Smoker  Smokeless Tobacco Never Used  Tobacco Comment   husbands and children smoked in home.      Counseling given: Not Answered Comment: husbands and children smoked in home.    Clinical Intake:  Pre-visit preparation completed: Yes        Diabetes: No  How often do you need to have someone help you when you read  instructions, pamphlets, or other written materials from your doctor or pharmacy?: 1 - Never  Interpreter Needed?: No     Past Medical History:  Diagnosis Date  . Allergy   . Arthritis   . Coronary artery disease 04/2017   Mild to moderate CAD in LAD/diagonal by CTA (CT-FFR of apical LAD 0.79).  . Depression   . Diverticulosis   . Essential hypertension    Normal cardiolite 05/2006 EF 71%  . GERD (gastroesophageal reflux disease)   . Headache   . History of shingles   . Hyperlipidemia   . Hypothyroidism   . Mini stroke (HCC) 2011   . Occipital neuralgia   . Prediabetes   . Stroke (HCC)   . Stroke [facility])    MRI 04/2008 + left sup. frontal gyrus possibly puntate infarct   . Urinary tract infection   . Vitamin D deficiency    Past Surgical History:  Procedure Laterality Date  . ABDOMINAL HYSTERECTOMY    . BLADDER SURGERY     2003  . BREAST EXCISIONAL BIOPSY Right Over 20 years    Benign  . CHOLECYSTECTOMY    . gastroplication     . JOINT REPLACEMENT Left   . KNEE ARTHROSCOPY Left 2011  . TONSILLECTOMY AND ADENOIDECTOMY    . TOTAL KNEE ARTHROPLASTY Left 04/17/2015   Procedure: LEFT TOTAL KNEE ARTHROPLASTY;  Surgeon: 06/17/2015, MD;  Location: [facility] ORS;  Service: Orthopedics;  Laterality: Left;   Family History  Problem Relation Age of Onset  .  Heart disease Mother   . Hypertension Mother   . Diabetes Mother   . Heart attack Mother [id number]  . Heart disease Father   . Heart attack Father [id number]  . Breast cancer Maternal Aunt   . Heart attack Brother    Social History   Socioeconomic History  . Marital status: Widowed    Spouse name: Not on file  . Number of children: Not on file  . Years of education: Not on file  . Highest education level: Not on file  Occupational History  . Not on file  Tobacco Use  . Smoking status: Passive Smoke Exposure - Never Smoker  . Smokeless tobacco: Never Used  . Tobacco comment: husbands and children smoked in home.   Substance and  Sexual Activity  . Alcohol use: No  . Drug use: No  . Sexual activity: Never  Other Topics Concern  . Not on file  Social History Narrative   Widowed    [place]    Has kids and grandson    Social Determinants of Health   Financial Resource Strain:   . Difficulty of Paying Living Expenses: Not on file  Food Insecurity:   . Worried About Programme researcher, broadcasting/film/videounning Out of Food in the Last Year: Not on file  . Ran Out of Food in the Last Year: Not on file  Transportation Needs:   . Lack of Transportation (Medical): Not on file  . Lack of Transportation (Non-Medical): Not on file  Physical Activity:   . Days of Exercise per Week: Not on file  . Minutes of Exercise per Session: Not on file  Stress:   . Feeling of Stress : Not on file  Social Connections:   . Frequency of Communication with Friends and Family: Not on file  . Frequency of Social Gatherings with Friends and Family: Not on file  . Attends Religious Services: Not on file  . Active Member of Clubs or Organizations: Not on file  . Attends BankerClub or Organization Meetings: Not on file  . Marital Status: Not on file    Outpatient Encounter Medications as of 07/22/2019  Medication Sig  . aspirin EC 81 MG tablet Take 1 tablet (81 mg total) by mouth daily.  Marland Kitchen. atorvastatin (LIPITOR) 20 MG tablet Take 1 tablet (20 mg total) by mouth daily.  . Biotin 1 MG CAPS Take 1 mg by mouth daily.   . cetirizine (ZYRTEC) 5 MG tablet Take 1-2 tablets (5-10 mg total) by mouth at bedtime as needed for allergies.  . Cholecalciferol (VITAMIN D3) 125 MCG (5000 UT) TABS Take 1 tablet (5,000 Units total) by mouth daily.  Marland Kitchen. conjugated estrogens (PREMARIN) vaginal cream Place 1 Applicatorful vaginally 3 (three) times a week.  . diclofenac sodium (VOLTAREN) 1 % GEL 2 grams qid to upper body and 4 grams qid to lower body prn  . diltiazem (CARDIZEM SR) 60 MG 12 hr capsule Take 1 capsule (60 mg total) by mouth 2 (two) times daily.  Marland Kitchen. escitalopram (LEXAPRO) 5 MG  tablet Take 1 tablet (5 mg total) by mouth daily.  . fluticasone (FLONASE) 50 MCG/ACT nasal spray Place 2 sprays into both nostrils daily as needed for allergies or rhinitis. Use after nasal saline  . folic acid (FOLVITE) 400 MCG tablet Take 1 tablet (400 mcg total) by mouth daily. SEPARATE ALL SUPPLEMENTS TO LUNCH OR DINNER AND PRILOSEC NOT TO MESS W/THYROID MED  . fosfomycin (MONUROL) 3 g PACK   . gabapentin (NEURONTIN) 100 MG  capsule 1 pill in the am and 2 pills qhs  . hydrALAZINE (APRESOLINE) 10 MG tablet Take 2 tablets (20 mg total) by mouth 2 (two) times daily as needed. BP>140/>90  . hydrochlorothiazide (MICROZIDE) 12.5 MG capsule Take 1 capsule (12.5 mg total) by mouth daily. In am  . levothyroxine (SYNTHROID, LEVOTHROID) 88 MCG tablet Take 1 tablet (88 mcg total) by mouth daily before breakfast. Skip sundays  . LORazepam (ATIVAN) 0.5 MG tablet Take 1 tablet (0.5 mg total) by mouth 2 (two) times daily as needed for anxiety or sleep. Or 1 mg at night for sleep  . mirabegron ER (MYRBETRIQ) 25 MG TB24 tablet Take 1 tablet (25 mg total) by mouth daily.  . montelukast (SINGULAIR) 10 MG tablet Take 1 tablet (10 mg total) by mouth at bedtime.  . mupirocin ointment (BACTROBAN) 2 % Apply 1 application topically 2 (two) times daily. Right lower leg  . nitrofurantoin, macrocrystal-monohydrate, (MACROBID) 100 MG capsule Take 1 capsule (100 mg total) by mouth 2 (two) times daily. With food stop other antibiotics  . omeprazole (PRILOSEC) 40 MG capsule Take 1 capsule (40 mg total) by mouth daily.  Marland Kitchen oxyCODONE-acetaminophen (PERCOCET/ROXICET) 5-325 MG tablet Take by mouth.  . sodium chloride (OCEAN) 0.65 % SOLN nasal spray Place 2 sprays into both nostrils as needed for congestion. Use 1st  . tiZANidine (ZANAFLEX) 4 MG tablet Take 1 tablet (4 mg total) by mouth 2 (two) times daily as needed for muscle spasms.  . traZODone (DESYREL) 50 MG tablet Take 1-2 tablets (50-100 mg total) by mouth at bedtime as  needed for sleep. 1 hr before bed  . vitamin B-12 (CYANOCOBALAMIN) 1000 MCG tablet Take 1 tablet (1,000 mcg total) by mouth daily.  . diazepam (VALIUM) 2 MG tablet TAKE 1-2 TABLETS BY MOUTH 30 MINUTES BEFORE ESI  . FLUAD QUADRIVALENT 0.5 ML injection   . HYDROcodone-acetaminophen (NORCO/VICODIN) 5-325 MG tablet Take 0.5-1 tablets by mouth daily as needed.  Marland Kitchen oxyCODONE-acetaminophen (PERCOCET/ROXICET) 5-325 MG tablet Take 1 tablet by mouth 2 (two) times daily as needed.   No facility-administered encounter medications on file as of 07/22/2019.    Activities of Daily Living In your present state of health, do you have any difficulty performing the following activities: 07/22/2019  Hearing? N  Vision? N  Difficulty concentrating or making decisions? N  Walking or climbing stairs? Y  Comment Unsteady gait  Dressing or bathing? N  Doing errands, shopping? 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 N  Using the Toilet? N  In the past six months, have you accidently leaked urine? Y  Comment Managed with daily pad  Do you have problems with loss of bowel control? N  Managing your Medications? N  Managing your Finances? N  Housekeeping or managing your Housekeeping? Y  Comment Maid assist  Some recent data might be hidden    Patient Care Team: McLean-Scocuzza, Pasty Spillers, MD as PCP - General (Internal Medicine) End, Cristal Deer, MD as PCP - Cardiology (Cardiology) Charlott Rakes, MD as Consulting Physician (Gastroenterology) Salvatore Marvel, MD as Consulting Physician (Orthopedic Surgery)    Assessment:   This is a routine wellness examination for Kimberly Cook.  Nurse connected with patient 07/22/19 at  9:00 AM EST by a telephone enabled telemedicine application and verified that I am speaking with the correct person using two identifiers. Patient stated full name and DOB. Patient gave permission to continue with virtual visit. Patient's location was at home and Nurse's  location was at [facility] office.   Patient is alert and oriented x3. Patient denies difficulty focusing or concentrating. Patient likes to use her ipad tablet to play brain games for brain stimulation.   Health Maintenance Due: See completed HM at the end of note.   Eye: Visual acuity not assessed. Virtual visit. Followed by their ophthalmologist.  Dental: Dentures- yes  Hearing: Demonstrates normal hearing during visit.  Safety:  Patient feels safe at home- yes Patient does have smoke detectors at home- yes Patient does wear sunscreen or protective clothing when in direct sunlight - yes Patient does wear seat belt when in a moving vehicle - yes Patient drives- no Adequate lighting in walkways free from debris- yes Grab bars and handrails used as appropriate- yes Ambulates with an assistive device- yes; walker Cell phone on person when ambulating outside of the home- yes  Social: Alcohol intake - no     Smoking history- never   Illicit drug use? none  Medication: Taking as directed and without issues.  Bubble wrap/pre pack -yes  Self managed - yes   Covid-19: Precautions and sickness symptoms discussed. Wears mask, social distancing, hand hygiene as appropriate.   Activities of Daily Living Patient denies needing assistance with: household chores, feeding themselves, getting from bed to chair, getting to the toilet, bathing/showering, dressing, managing money, or preparing meals.   Discussed the importance of a healthy diet, water intake and the benefits of aerobic exercise.    Physical activity- leg exercises daily, 5-10 minutes  Diet:  Regular Water: good intake Caffeine: none  Other Providers Patient Care Team: McLean-Scocuzza, Nino Glow, MD as PCP - General (Internal Medicine) End, Harrell Gave, MD as PCP - Cardiology (Cardiology) Wilford Corner, MD as Consulting Physician (Gastroenterology) Elsie Saas, MD as Consulting Physician (Orthopedic Surgery)   Exercise Activities and Dietary recommendations Current Exercise Habits: Home exercise routine, Time (Minutes): 10, Frequency (Times/Week): 7, Weekly Exercise (Minutes/Week): 70, Intensity: Mild  Goals      Patient Stated   . Blood Pressure < 140/90 (pt-stated)     Monitor       Fall Risk Fall Risk  07/22/2019 01/01/2019 07/17/2018 06/26/2018 03/10/2018  Falls in the past year? 1 1 0 1 No  Comment - - No falls since last report - -  Number falls in past yr: 0 1 - 1 -  Comment - - - - -  Injury with Fall? - - - 0 -  Risk Factor Category  - - - - -  Risk for fall due to : Impaired balance/gait History of fall(s) - History of fall(s) -  Risk for fall due to: Comment - - - - -  Follow up Falls prevention discussed - - - -   Timed Get Up and Go performed: no, virtual visit  Depression Screen PHQ 2/9 Scores 07/22/2019 07/17/2018 03/10/2018 12/16/2017  PHQ - 2 Score 1 1 0 0  PHQ- 9 Score - - - -     Cognitive Function     6CIT Screen 07/22/2019 07/17/2018  What Year? 0 points 0 points  What month? 0 points 0 points  What time? 0 points 0 points  Count back from 20 0 points 0 points  Months in reverse 0 points 0 points  Repeat phrase 0 points 0 points  Total Score 0 0    Immunization History  Administered Date(s) Administered  . DT 09/26/2015  . Fluad Quad(high Dose 65+) 04/29/2019  . Influenza, High Dose Seasonal PF 06/16/2015, 04/02/2016,  05/06/2017, 05/04/2018  . Influenza-Unspecified 04/18/2013, 04/05/2014  . PPD Test 04/19/2015  . Pneumococcal Conjugate-13 06/16/2015  . Pneumococcal Polysaccharide-23 01/19/2018, 05/22/2018  . Pneumococcal-Unspecified 08/18/2010  . Td 08/18/2005  . Zoster 08/18/2005  . Zoster Recombinat (Shingrix) 05/22/2018   Screening Tests Health Maintenance  Topic Date Due  . MAMMOGRAM  04/13/2021  . COLONOSCOPY  06/02/2021  . TETANUS/TDAP  09/25/2025  . INFLUENZA VACCINE  Completed  . DEXA SCAN  Completed  . Hepatitis C Screening   Completed  . PNA vac Low Risk Adult  Completed  . FOOT EXAM  Discontinued  . HEMOGLOBIN A1C  Discontinued  . OPHTHALMOLOGY EXAM  Discontinued      Plan:   Keep all routine maintenance appointments.   Follow up with 07/23/19 @ 930  Medicare Attestation I have personally reviewed: The patient's medical and social history Their use of alcohol, tobacco or illicit drugs Their current medications and supplements The patient's functional ability including ADLs,fall risks, home safety risks, cognitive, and hearing and visual impairment Diet and physical activities Evidence for depression   I have reviewed and discussed with patient certain preventive protocols, quality metrics, and best practice recommendations.   Ashok PallOBrien-Blaney, Ilhan Debenedetto L, LPN  [id number]/17/2020

## 2019-07-23 ENCOUNTER — Encounter: Payer: Self-pay | Admitting: Internal Medicine

## 2019-07-23 ENCOUNTER — Ambulatory Visit: Payer: Medicare HMO

## 2019-07-23 ENCOUNTER — Ambulatory Visit (INDEPENDENT_AMBULATORY_CARE_PROVIDER_SITE_OTHER): Payer: Medicare HMO | Admitting: Internal Medicine

## 2019-07-23 VITALS — BP 121/79 | Ht 63.0 in | Wt 170.0 lb

## 2019-07-23 DIAGNOSIS — M5136 Other intervertebral disc degeneration, lumbar region: Secondary | ICD-10-CM | POA: Diagnosis not present

## 2019-07-23 DIAGNOSIS — M545 Low back pain, unspecified: Secondary | ICD-10-CM

## 2019-07-23 DIAGNOSIS — G8929 Other chronic pain: Secondary | ICD-10-CM | POA: Diagnosis not present

## 2019-07-23 DIAGNOSIS — I10 Essential (primary) hypertension: Secondary | ICD-10-CM | POA: Diagnosis not present

## 2019-07-23 DIAGNOSIS — M5126 Other intervertebral disc displacement, lumbar region: Secondary | ICD-10-CM | POA: Diagnosis not present

## 2019-07-23 DIAGNOSIS — E039 Hypothyroidism, unspecified: Secondary | ICD-10-CM | POA: Diagnosis not present

## 2019-07-23 DIAGNOSIS — R937 Abnormal findings on diagnostic imaging of other parts of musculoskeletal system: Secondary | ICD-10-CM | POA: Diagnosis not present

## 2019-07-23 NOTE — Progress Notes (Signed)
Telephone Note  I connected with Mrs. Kimberly Cook  on 07/23/19 at  9:30 AM EST by a telephone and verified that I am speaking with the correct person using two identifiers.  Location patient: home Location provider:work or home office Persons participating in the virtual visit: patient, provider  I discussed the limitations of evaluation and management by telemedicine and the availability of in person appointments. The patient expressed understanding and agreed to proceed.   HPI: 1. Chronic low back pain; needs osteoporosis tx x 3 months awaiting approval forteo vs Tymlos forteo too expensive. Doing leg exercises to help. Lidocaine patches no help and perocet 5-325 mg bid  IMPRESSION: 1. Overall, no significant interval change in appearance of the lumbar spine as compared to recent MRI from 01/15/2019. No new or progressive findings. 2. Moderate levoscoliosis with multilevel lumbar spondylosis and facet degeneration. Associated mild to moderate bilateral lateral recess and foraminal narrowing at L1-2 through L4-5 as above.  2. HTN last 157104 last week  3. Cousin died 2019-07-26 had COVID 19  4. Hypothyroidism on levo 88 mcg tsh check [facility]   ROS: See pertinent positives and negatives per HPI.  Past Medical History:  Diagnosis Date  . Allergy   . Arthritis   . Coronary artery disease 04/2017   Mild to moderate CAD in LAD/diagonal by CTA (CT-FFR of apical LAD 0.79).  . Depression   . Diverticulosis   . Essential hypertension    Normal cardiolite 05/2006 EF 71%  . GERD (gastroesophageal reflux disease)   . Headache   . History of shingles   . Hyperlipidemia   . Hypothyroidism   . Mini stroke (HCC) 2011   . Occipital neuralgia   . Prediabetes   . Stroke (HCC)   . Stroke [facility])    MRI 04/2008 + left sup. frontal gyrus possibly puntate infarct   . Urinary tract infection   . Vitamin D deficiency     Past Surgical History:  Procedure Laterality Date  . ABDOMINAL  HYSTERECTOMY    . BLADDER SURGERY     2003  . BREAST EXCISIONAL BIOPSY Right Over 20 years    Benign  . CHOLECYSTECTOMY    . gastroplication     . JOINT REPLACEMENT Left   . KNEE ARTHROSCOPY Left 2011  . TONSILLECTOMY AND ADENOIDECTOMY    . TOTAL KNEE ARTHROPLASTY Left 04/17/2015   Procedure: LEFT TOTAL KNEE ARTHROPLASTY;  Surgeon: Durene Romans, MD;  Location: [facility];  Service: Orthopedics;  Laterality: Left;    Family History  Problem Relation Age of Onset  . Heart disease Mother   . Hypertension Mother   . Diabetes Mother   . Heart attack Mother 33  . Heart disease Father   . Heart attack Father 84  . Breast cancer Maternal Aunt   . Heart attack Brother     SOCIAL HX:   Widowed  [place]  Has kids and grandson   Current Outpatient Medications:  .  aspirin EC 81 MG tablet, Take 1 tablet (81 mg total) by mouth daily., Disp: 90 tablet, Rfl: 3 .  atorvastatin (LIPITOR) 20 MG tablet, Take 1 tablet (20 mg total) by mouth daily., Disp: 90 tablet, Rfl: 3 .  Biotin 1 MG CAPS, Take 1 mg by mouth daily. , Disp: , Rfl:  .  cetirizine (ZYRTEC) 5 MG tablet, Take 1-2 tablets (5-10 mg total) by mouth at bedtime as needed for allergies., Disp: 90 tablet, Rfl: 3 .  Cholecalciferol (VITAMIN D3)  125 MCG (5000 UT) TABS, Take 1 tablet (5,000 Units total) by mouth daily., Disp: 90 tablet, Rfl: 3 .  conjugated estrogens (PREMARIN) vaginal cream, Place 1 Applicatorful vaginally 3 (three) times a week., Disp: 30 g, Rfl: 0 .  diazepam (VALIUM) 2 MG tablet, TAKE 1-2 TABLETS BY MOUTH 30 MINUTES BEFORE ESI, Disp: , Rfl:  .  diclofenac sodium (VOLTAREN) 1 % GEL, 2 grams qid to upper body and 4 grams qid to lower body prn, Disp: 100 g, Rfl: 11 .  diltiazem (CARDIZEM SR) 60 MG 12 hr capsule, Take 1 capsule (60 mg total) by mouth 2 (two) times daily., Disp: 180 capsule, Rfl: 3 .  escitalopram (LEXAPRO) 5 MG tablet, Take 1 tablet (5 mg total) by mouth daily., Disp: 90 tablet, Rfl: 3 .  FLUAD  QUADRIVALENT 0.5 ML injection, , Disp: , Rfl:  .  fluticasone (FLONASE) 50 MCG/ACT nasal spray, Place 2 sprays into both nostrils daily as needed for allergies or rhinitis. Use after nasal saline, Disp: 16 g, Rfl: 12 .  folic acid (FOLVITE) 400 MCG tablet, Take 1 tablet (400 mcg total) by mouth daily. SEPARATE ALL SUPPLEMENTS TO LUNCH OR DINNER AND PRILOSEC NOT TO MESS W/THYROID MED, Disp: 90 tablet, Rfl: 3 .  fosfomycin (MONUROL) 3 g PACK, , Disp: , Rfl:  .  gabapentin (NEURONTIN) 100 MG capsule, 1 pill in the am and 2 pills qhs, Disp: 180 capsule, Rfl: 3 .  hydrALAZINE (APRESOLINE) 10 MG tablet, Take 2 tablets (20 mg total) by mouth 2 (two) times daily as needed. BP>140/>90, Disp: 180 tablet, Rfl: 3 .  hydrochlorothiazide (MICROZIDE) 12.5 MG capsule, Take 1 capsule (12.5 mg total) by mouth daily. In am, Disp: 90 capsule, Rfl: 3 .  HYDROcodone-acetaminophen (NORCO/VICODIN) 5-325 MG tablet, Take 0.5-1 tablets by mouth daily as needed., Disp: , Rfl:  .  levothyroxine (SYNTHROID, LEVOTHROID) 88 MCG tablet, Take 1 tablet (88 mcg total) by mouth daily before breakfast. Skip sundays, Disp: 90 tablet, Rfl: 3 .  LORazepam (ATIVAN) 0.5 MG tablet, Take 1 tablet (0.5 mg total) by mouth 2 (two) times daily as needed for anxiety or sleep. Or 1 mg at night for sleep, Disp: 60 tablet, Rfl: 5 .  mirabegron ER (MYRBETRIQ) 25 MG TB24 tablet, Take 1 tablet (25 mg total) by mouth daily., Disp: 30 tablet, Rfl: 2 .  montelukast (SINGULAIR) 10 MG tablet, Take 1 tablet (10 mg total) by mouth at bedtime., Disp: 90 tablet, Rfl: 3 .  mupirocin ointment (BACTROBAN) 2 %, Apply 1 application topically 2 (two) times daily. Right lower leg, Disp: 30 g, Rfl: 0 .  nitrofurantoin, macrocrystal-monohydrate, (MACROBID) 100 MG capsule, Take 1 capsule (100 mg total) by mouth 2 (two) times daily. With food stop other antibiotics, Disp: 10 capsule, Rfl: 0 .  omeprazole (PRILOSEC) 40 MG capsule, Take 1 capsule (40 mg total) by mouth daily.,  Disp: 90 capsule, Rfl: 3 .  oxyCODONE-acetaminophen (PERCOCET/ROXICET) 5-325 MG tablet, Take by mouth., Disp: , Rfl:  .  oxyCODONE-acetaminophen (PERCOCET/ROXICET) 5-325 MG tablet, Take 1 tablet by mouth 2 (two) times daily as needed., Disp: , Rfl:  .  sodium chloride (OCEAN) 0.65 % SOLN nasal spray, Place 2 sprays into both nostrils as needed for congestion. Use 1st, Disp: 1 Bottle, Rfl: 12 .  tiZANidine (ZANAFLEX) 4 MG tablet, Take 1 tablet (4 mg total) by mouth 2 (two) times daily as needed for muscle spasms., Disp: 60 tablet, Rfl: 5 .  traZODone (DESYREL) 50 MG tablet, Take 1-2  tablets (50-100 mg total) by mouth at bedtime as needed for sleep. 1 hr before bed, Disp: 60 tablet, Rfl: 3 .  vitamin B-12 (CYANOCOBALAMIN) 1000 MCG tablet, Take 1 tablet (1,000 mcg total) by mouth daily., Disp: 90 tablet, Rfl: 3  EXAM:  VITALS per patient if applicable:  GENERAL: alert, oriented, appears well and in no acute distress  PSYCH/NEURO: pleasant and cooperative, no obvious depression or anxiety, speech and thought processing grossly intact  ASSESSMENT AND PLAN:  Discussed the following assessment and plan:  Abnormal MRI, lumbar spine Lumbar herniated disc Disc degeneration, lumbar Chronic back pain  -pending surgery NS will need to be on osteoporosis x 3 mos before NS will reeval.   Essential hypertension Cont meds  Hypothyroidism  Cont meds TSH 07/14/2019 3.929   HM Flu shot utd 04/29/19  Tdapconsider in future unable to find record pna23 utd prevnar had 06/16/15 shingrix 1/2 10/18/19likely will need to repeat series should have had 2nd ose in 11/2018 Consider hep B vaccine  A1C 5.0  colonoscopy had 06/03/11 Dr. Michail Sermon diverticulosis f/u in 10 years  mammo [facility] 04/14/19 neg  Out of age window pap last 01/26/10 neg   dexa 04/14/19 osteopenia with high frax score considering forteo x 84months then NS reeval for low  Back surgery vs vs Tymlos vs evenity  -pt c/o cost  of med as of 07/23/19  hcv neg Lipid had 10/09/18  reviewed CT chestoverdue ordered by Dr. Lake Bells Will need dermatology in future$ is issue currently  -we discussed possible serious and likely etiologies, options for evaluation and workup, limitations of telemedicine visit vs in person visit, treatment, treatment risks and precautions. Pt prefers to treat via telemedicine empirically rather then risking or undertaking an in person visit at this moment. Patient agrees to seek prompt in person care if worsening, new symptoms arise, or if is not improving with treatment.   I discussed the assessment and treatment plan with the patient. The patient was provided an opportunity to ask questions and all were answered. The patient agreed with the plan and demonstrated an understanding of the instructions.   The patient was advised to call back or seek an in-person evaluation if the symptoms worsen or if the condition fails to improve as anticipated.  Time spent 15 minutes  Delorise Jackson, MD

## 2019-07-26 ENCOUNTER — Telehealth: Payer: Self-pay

## 2019-07-26 NOTE — Telephone Encounter (Signed)
Patient was notified by [place]

## 2019-07-26 NOTE — Telephone Encounter (Signed)
Noted inform pt   Thanks [place]

## 2019-07-26 NOTE — Telephone Encounter (Signed)
[place],FNP called to let Dr Olivia Mackie know that Kimberly Cook is still awaiting the decision on Tymlos. Originally they prescribed Forteo, which was too exspensive. They made patient aware that the pprocess would take awhile. They have tried to speed up the process but cant do anything more. If the decision comes back denied, they stated she will them go to a pill.

## 2019-08-05 ENCOUNTER — Other Ambulatory Visit: Payer: Self-pay | Admitting: Internal Medicine

## 2019-08-05 DIAGNOSIS — G4700 Insomnia, unspecified: Secondary | ICD-10-CM

## 2019-08-05 MED ORDER — TRAZODONE HCL 50 MG PO TABS: 50.0000 mg | ORAL_TABLET | Freq: Every evening | ORAL | 11 refills | Status: DC | PRN

## 2019-08-11 ENCOUNTER — Other Ambulatory Visit: Payer: Self-pay | Admitting: Internal Medicine

## 2019-08-11 DIAGNOSIS — J309 Allergic rhinitis, unspecified: Secondary | ICD-10-CM

## 2019-08-11 DIAGNOSIS — J321 Chronic frontal sinusitis: Secondary | ICD-10-CM

## 2019-08-11 MED ORDER — CETIRIZINE HCL 5 MG PO TABS: 5.0000 mg | ORAL_TABLET | Freq: Every evening | ORAL | 3 refills | Status: DC | PRN

## 2019-09-23 ENCOUNTER — Ambulatory Visit: Payer: Medicare HMO | Admitting: Physician Assistant

## 2019-09-30 DIAGNOSIS — M4014 Other secondary kyphosis, thoracic region: Secondary | ICD-10-CM | POA: Diagnosis not present

## 2019-09-30 DIAGNOSIS — M419 Scoliosis, unspecified: Secondary | ICD-10-CM | POA: Diagnosis not present

## 2019-09-30 DIAGNOSIS — M810 Age-related osteoporosis without current pathological fracture: Secondary | ICD-10-CM | POA: Diagnosis not present

## 2019-10-01 ENCOUNTER — Other Ambulatory Visit: Payer: Self-pay | Admitting: Neurosurgery

## 2019-10-01 DIAGNOSIS — M419 Scoliosis, unspecified: Secondary | ICD-10-CM

## 2019-10-01 DIAGNOSIS — M4014 Other secondary kyphosis, thoracic region: Secondary | ICD-10-CM

## 2019-10-04 ENCOUNTER — Other Ambulatory Visit: Payer: Self-pay | Admitting: Physician Assistant

## 2019-10-05 DIAGNOSIS — M5416 Radiculopathy, lumbar region: Secondary | ICD-10-CM | POA: Diagnosis not present

## 2019-10-05 DIAGNOSIS — M47816 Spondylosis without myelopathy or radiculopathy, lumbar region: Secondary | ICD-10-CM | POA: Diagnosis not present

## 2019-10-05 DIAGNOSIS — M48062 Spinal stenosis, lumbar region with neurogenic claudication: Secondary | ICD-10-CM | POA: Diagnosis not present

## 2019-10-05 DIAGNOSIS — M5136 Other intervertebral disc degeneration, lumbar region: Secondary | ICD-10-CM | POA: Diagnosis not present

## 2019-10-07 ENCOUNTER — Encounter: Payer: Self-pay | Admitting: Physician Assistant

## 2019-10-07 ENCOUNTER — Other Ambulatory Visit: Payer: Self-pay

## 2019-10-07 ENCOUNTER — Ambulatory Visit: Payer: Medicare HMO | Admitting: Physician Assistant

## 2019-10-07 VITALS — BP 148/74 | HR 76 | Ht 63.0 in | Wt 170.0 lb

## 2019-10-07 DIAGNOSIS — R8271 Bacteriuria: Secondary | ICD-10-CM

## 2019-10-07 DIAGNOSIS — N3281 Overactive bladder: Secondary | ICD-10-CM

## 2019-10-07 DIAGNOSIS — N3941 Urge incontinence: Secondary | ICD-10-CM | POA: Diagnosis not present

## 2019-10-07 DIAGNOSIS — N390 Urinary tract infection, site not specified: Secondary | ICD-10-CM | POA: Diagnosis not present

## 2019-10-07 LAB — URINALYSIS, COMPLETE
Bilirubin, UA: NEGATIVE
Glucose, UA: NEGATIVE
Ketones, UA: NEGATIVE
Nitrite, UA: NEGATIVE
Specific Gravity, UA: 1.015 (ref 1.005–1.030)
Urobilinogen, Ur: 0.2 mg/dL (ref 0.2–1.0)
pH, UA: 5.5 (ref 5.0–7.5)

## 2019-10-07 LAB — MICROSCOPIC EXAMINATION: WBC, UA: >30 /hpf — AB (ref 0–5)

## 2019-10-07 LAB — BLADDER SCAN AMB NON-IMAGING: Scan Result: 10

## 2019-10-07 MED ORDER — MIRABEGRON ER 25 MG PO TB24: 25.0000 mg | ORAL_TABLET | Freq: Every day | ORAL | 11 refills | Status: DC

## 2019-10-07 NOTE — Progress Notes (Signed)
10/07/2019 9:48 AM   [name] 05-18-1946 [id number]  CC: OAB, recurrent UTI symptom recheck  HPI: WARRENE KAPFER is a 74 y.o. female who presents today for symptom recheck for history of OAB on Myrbetriq 25 mg and recurrent UTI on probiotic supplementation, cranberry supplementation, and Colace bowel regimen.  At her last visit, I recommended that she start Premarin topical vaginal estrogen cream.  She states she started taking this, however it caused her severe vulvar burning and she stopped it secondary to this.  She has continued to be compliant with daily probiotics, cranberry supplements, Myrbetriq, and Colace.  Today, she reports continued resolution of dysuria and states she feels well.  She still has mild urgency, but states she has "enough time to get to the bathroom" when she feels it.  Additionally, she states she does have to strain a bit to fully empty her bladder but feels that she is fully emptying.  She denies urinary incontinence.  Overall, she is very pleased with the improvement in her urinary symptoms since starting her urinary regimen as above.  Of note, she states she anticipates undergoing prolonged spinal surgery in late April/early May of this year to treat her chronic back pain.  Lastly, there is some question about whether or not she is taking or ought to take daily Macrobid for suppression of recurrent UTI.  She was prescribed this previously, however she gets her medications in pill pack form from [facility] and is unsure of which medications she is currently taking.  In-office UA today positive for trace-intact blood, trace protein, and 2+ leukocyte esterase; urine microscopy with >30 WBCs/HPF and many bacteria. PVR 31mL.  PMH: Past Medical History:  Diagnosis Date  . Allergy   . Arthritis   . Coronary artery disease 04/2017   Mild to moderate CAD in LAD/diagonal by CTA (CT-FFR of apical LAD 0.79).  . Depression   . Diverticulosis    . Essential hypertension    Normal cardiolite 05/2006 EF 71%  . GERD (gastroesophageal reflux disease)   . Headache   . History of shingles   . Hyperlipidemia   . Hypothyroidism   . Mini stroke (HCC) 2011   . Occipital neuralgia   . Prediabetes   . Stroke (HCC)   . Stroke [facility])    MRI 04/2008 + left sup. frontal gyrus possibly puntate infarct   . Urinary tract infection   . Vitamin D deficiency     Surgical History: Past Surgical History:  Procedure Laterality Date  . ABDOMINAL HYSTERECTOMY    . BLADDER SURGERY     2003  . BREAST EXCISIONAL BIOPSY Right Over 20 years    Benign  . CHOLECYSTECTOMY    . gastroplication     . JOINT REPLACEMENT Left   . KNEE ARTHROSCOPY Left 2011  . TONSILLECTOMY AND ADENOIDECTOMY    . TOTAL KNEE ARTHROPLASTY Left 04/17/2015   Procedure: LEFT TOTAL KNEE ARTHROPLASTY;  Surgeon: Durene Romans, MD;  Location: [facility];  Service: Orthopedics;  Laterality: Left;    Home Medications:  Allergies as of 10/07/2019   No Known Allergies     Medication List       Accurate as of October 07, 2019  9:48 AM. If you have any questions, ask your nurse or doctor.        aspirin EC 81 MG tablet Take 1 tablet (81 mg total) by mouth daily.   atorvastatin 20 MG tablet Commonly known as: LIPITOR Take 1  tablet (20 mg total) by mouth daily.   Biotin 1 MG Caps Take 1 mg by mouth daily.   cetirizine 5 MG tablet Commonly known as: ZYRTEC Take 1-2 tablets (5-10 mg total) by mouth at bedtime as needed for allergies.   diazepam 2 MG tablet Commonly known as: VALIUM TAKE 1-2 TABLETS BY MOUTH 30 MINUTES BEFORE ESI   diclofenac sodium 1 % Gel Commonly known as: Voltaren 2 grams qid to upper body and 4 grams qid to lower body prn   diltiazem 60 MG 12 hr capsule Commonly known as: CARDIZEM SR Take 1 capsule (60 mg total) by mouth 2 (two) times daily.   escitalopram 5 MG tablet Commonly known as: LEXAPRO Take 1 tablet (5 mg total) by mouth daily.   Fluad  Quadrivalent 0.5 ML injection Generic drug: influenza vaccine adjuvanted   fluticasone 50 MCG/ACT nasal spray Commonly known as: FLONASE Place 2 sprays into both nostrils daily as needed for allergies or rhinitis. Use after nasal saline   folic acid 400 MCG tablet Commonly known as: FOLVITE Take 1 tablet (400 mcg total) by mouth daily. SEPARATE ALL SUPPLEMENTS TO LUNCH OR DINNER AND PRILOSEC NOT TO MESS W/THYROID MED   gabapentin 100 MG capsule Commonly known as: NEURONTIN 1 pill in the am and 2 pills qhs   hydrALAZINE 10 MG tablet Commonly known as: APRESOLINE Take 2 tablets (20 mg total) by mouth 2 (two) times daily as needed. BP>140/>90   hydrochlorothiazide 12.5 MG capsule Commonly known as: Microzide Take 1 capsule (12.5 mg total) by mouth daily. In am   HYDROcodone-acetaminophen 5-325 MG tablet Commonly known as: NORCO/VICODIN Take 0.5-1 tablets by mouth daily as needed.   levothyroxine 88 MCG tablet Commonly known as: SYNTHROID Take 1 tablet (88 mcg total) by mouth daily before breakfast. Skip sundays   LORazepam 0.5 MG tablet Commonly known as: Ativan Take 1 tablet (0.5 mg total) by mouth 2 (two) times daily as needed for anxiety or sleep. Or 1 mg at night for sleep   montelukast 10 MG tablet Commonly known as: SINGULAIR Take 1 tablet (10 mg total) by mouth at bedtime.   Monurol 3 g Pack Generic drug: fosfomycin   mupirocin ointment 2 % Commonly known as: Bactroban Apply 1 application topically 2 (two) times daily. Right lower leg   Myrbetriq 25 MG Tb24 tablet Generic drug: mirabegron ER TAKE 1 TABLET BY MOUTH DAILY   nitrofurantoin (macrocrystal-monohydrate) 100 MG capsule Commonly known as: Macrobid Take 1 capsule (100 mg total) by mouth 2 (two) times daily. With food stop other antibiotics   omeprazole 40 MG capsule Commonly known as: PRILOSEC Take 1 capsule (40 mg total) by mouth daily.   oxyCODONE-acetaminophen 5-325 MG tablet Commonly known as:  PERCOCET/ROXICET Take by mouth.   oxyCODONE-acetaminophen 5-325 MG tablet Commonly known as: PERCOCET/ROXICET Take 1 tablet by mouth 2 (two) times daily as needed.   Premarin vaginal cream Generic drug: conjugated estrogens Place 1 Applicatorful vaginally 3 (three) times a week.   sodium chloride 0.65 % Soln nasal spray Commonly known as: OCEAN Place 2 sprays into both nostrils as needed for congestion. Use 1st   tiZANidine 4 MG tablet Commonly known as: ZANAFLEX Take 1 tablet (4 mg total) by mouth 2 (two) times daily as needed for muscle spasms.   traZODone 50 MG tablet Commonly known as: DESYREL Take 1-2 tablets (50-100 mg total) by mouth at bedtime as needed for sleep. 1 hr before bed   vitamin B-12 1000 MCG  tablet Commonly known as: CYANOCOBALAMIN Take 1 tablet (1,000 mcg total) by mouth daily.   Vitamin D3 125 MCG (5000 UT) Tabs Take 1 tablet (5,000 Units total) by mouth daily.       Allergies:  No Known Allergies  Family History: Family History  Problem Relation Age of Onset  . Heart disease Mother   . Hypertension Mother   . Diabetes Mother   . Heart attack Mother 31  . Heart disease Father   . Heart attack Father 50  . Breast cancer Maternal Aunt   . Heart attack Brother     Social History:   reports that she is a non-smoker but has been exposed to tobacco smoke. She has never used smokeless tobacco. She reports that she does not drink alcohol or use drugs.  Physical Exam: BP (!) 148/74   Pulse 76   Ht 5\' 3"  (1.6 m)   Wt 170 lb (77.1 kg)   BMI 30.11 kg/m   Constitutional:  Alert and oriented, no acute distress, nontoxic appearing HEENT: [place], AT Cardiovascular: No clubbing, cyanosis, or edema Respiratory: Normal respiratory effort, no increased work of breathing Skin: No rashes, bruises or suspicious lesions Neurologic: Grossly intact, no focal deficits, moving all 4 extremities Psychiatric: Normal mood and affect  Laboratory Data: Results for  orders placed or performed in visit on 10/07/19  Microscopic Examination   URINE  Result Value Ref Range   WBC, UA >30 (A) 0 - 5 /hpf   RBC 0-2 0 - 2 /hpf   Epithelial Cells (non renal) 0-10 0 - 10 /hpf   Bacteria, UA Many (A) None seen/Few  Urinalysis, Complete  Result Value Ref Range   Specific Gravity, UA 1.015 1.005 - 1.030   pH, UA 5.5 5.0 - 7.5   Color, UA Yellow Yellow   Appearance Ur Cloudy (A) Clear   Leukocytes,UA 2+ (A) Negative   Protein,UA Trace (A) Negative/Trace   Glucose, UA Negative Negative   Ketones, UA Negative Negative   RBC, UA Trace (A) Negative   Bilirubin, UA Negative Negative   Urobilinogen, Ur 0.2 0.2 - 1.0 mg/dL   Nitrite, UA Negative Negative   Microscopic Examination See below:   Bladder Scan (Post Void Residual) in office  Result Value Ref Range   Scan Result 10    Assessment & Plan:   1. OAB (overactive bladder) Symptoms significantly improved on Myrbetriq 25 mg daily.  PVR WNL.  Would be cautious in increasing her dose due to reports of some straining to fully empty her bladder.  Will refill for an additional year at 25mg  dose. - Bladder Scan (Post Void Residual) in office - mirabegron ER (MYRBETRIQ) 25 MG TB24 tablet; Take 1 tablet (25 mg total) by mouth daily.  Dispense: 30 tablet; Refill: 11  2. Recurrent UTI Dysuria resolved on a UTI prevention regimen of probiotics, cranberry supplements, and bowel regimen.  Additionally, I contacted her pharmacy via telephone today and confirmed that she is not receiving daily Macrobid.  I have discontinued this medication in her chart.  She should not be on a daily suppressive antibiotic. - Urinalysis, Complete - Microscopic Examination   3. Asymptomatic bacteriuria I had a lengthy conversation with the patient today regarding her asymptomatic bacteriuria.  I explained that she is coexisting with the bacteria in her bladder and that her positive UAs do not represent true urinary infection given the  absence of infective symptoms.  I advised her that she should not undergo urinalysis if  she does not have symptoms of a urinary tract infection, including dysuria, worsened urgency, frequency, lower abdominal pain, fever, chills, nausea, vomiting, and/or flank pain.  She expressed understanding.  Return in about 1 year (around 10/06/2020) for PVR and OAB questionnaire with Dr. Apolinar Junes.  Carman Ching, PA-C  [facility] [address] [place], [place] [address] [phone]

## 2019-10-19 ENCOUNTER — Encounter: Payer: Self-pay | Admitting: *Deleted

## 2019-10-19 ENCOUNTER — Emergency Department: Payer: Medicare PPO

## 2019-10-19 ENCOUNTER — Other Ambulatory Visit: Payer: Self-pay

## 2019-10-19 ENCOUNTER — Emergency Department
Admission: EM | Admit: 2019-10-19 | Discharge: 2019-10-20 | Disposition: A | Discharge status: Home / Self Care | Payer: Medicare PPO | Attending: Emergency Medicine | Admitting: Emergency Medicine

## 2019-10-19 DIAGNOSIS — Z96652 Presence of left artificial knee joint: Secondary | ICD-10-CM | POA: Diagnosis not present

## 2019-10-19 DIAGNOSIS — K5900 Constipation, unspecified: Secondary | ICD-10-CM | POA: Diagnosis not present

## 2019-10-19 DIAGNOSIS — Z8673 Personal history of transient ischemic attack (TIA), and cerebral infarction without residual deficits: Secondary | ICD-10-CM | POA: Diagnosis not present

## 2019-10-19 DIAGNOSIS — N183 Chronic kidney disease, stage 3 unspecified: Secondary | ICD-10-CM | POA: Diagnosis not present

## 2019-10-19 DIAGNOSIS — I129 Hypertensive chronic kidney disease with stage 1 through stage 4 chronic kidney disease, or unspecified chronic kidney disease: Secondary | ICD-10-CM | POA: Diagnosis not present

## 2019-10-19 DIAGNOSIS — I2510 Atherosclerotic heart disease of native coronary artery without angina pectoris: Secondary | ICD-10-CM | POA: Insufficient documentation

## 2019-10-19 DIAGNOSIS — Z7982 Long term (current) use of aspirin: Secondary | ICD-10-CM | POA: Diagnosis not present

## 2019-10-19 DIAGNOSIS — E039 Hypothyroidism, unspecified: Secondary | ICD-10-CM | POA: Diagnosis not present

## 2019-10-19 DIAGNOSIS — Z7722 Contact with and (suspected) exposure to environmental tobacco smoke (acute) (chronic): Secondary | ICD-10-CM | POA: Insufficient documentation

## 2019-10-19 DIAGNOSIS — I10 Essential (primary) hypertension: Secondary | ICD-10-CM | POA: Diagnosis not present

## 2019-10-19 DIAGNOSIS — K5732 Diverticulitis of large intestine without perforation or abscess without bleeding: Secondary | ICD-10-CM | POA: Diagnosis not present

## 2019-10-19 DIAGNOSIS — Z79899 Other long term (current) drug therapy: Secondary | ICD-10-CM | POA: Diagnosis not present

## 2019-10-19 DIAGNOSIS — K5792 Diverticulitis of intestine, part unspecified, without perforation or abscess without bleeding: Secondary | ICD-10-CM | POA: Diagnosis not present

## 2019-10-19 DIAGNOSIS — M5489 Other dorsalgia: Secondary | ICD-10-CM | POA: Diagnosis not present

## 2019-10-19 DIAGNOSIS — M545 Low back pain: Secondary | ICD-10-CM | POA: Diagnosis present

## 2019-10-19 LAB — COMPREHENSIVE METABOLIC PANEL
ALT: 16 U/L (ref 0–44)
AST: 17 U/L (ref 15–41)
Albumin: 4 g/dL (ref 3.5–5.0)
Alkaline Phosphatase: 48 U/L (ref 38–126)
Anion gap: 12 (ref 5–15)
BUN: 13 mg/dL (ref 8–23)
CO2: 24 mmol/L (ref 22–32)
Calcium: 9.1 mg/dL (ref 8.9–10.3)
Chloride: 100 mmol/L (ref 98–111)
Creatinine, Ser: 0.84 mg/dL (ref 0.44–1.00)
GFR calc Af Amer: >60 mL/min (ref >60)
GFR calc non Af Amer: >60 mL/min (ref >60)
Glucose, Bld: 116 mg/dL — ABNORMAL HIGH (ref 70–99)
Potassium: 3.3 mmol/L — ABNORMAL LOW (ref 3.5–5.1)
Sodium: 136 mmol/L (ref 135–145)
Total Bilirubin: 0.9 mg/dL (ref 0.3–1.2)
Total Protein: 7.4 g/dL (ref 6.5–8.1)

## 2019-10-19 LAB — LIPASE, BLOOD: Lipase: 27 U/L (ref 11–51)

## 2019-10-19 LAB — URINALYSIS, COMPLETE (UACMP) WITH MICROSCOPIC
Bilirubin Urine: NEGATIVE
Glucose, UA: NEGATIVE mg/dL
Hgb urine dipstick: NEGATIVE
Ketones, ur: NEGATIVE mg/dL
Nitrite: NEGATIVE
Protein, ur: NEGATIVE mg/dL
Specific Gravity, Urine: 1.01 (ref 1.005–1.030)
WBC, UA: >50 WBC/hpf — ABNORMAL HIGH (ref 0–5)
pH: 7 (ref 5.0–8.0)

## 2019-10-19 LAB — CBC
HCT: 37.4 % (ref 36.0–46.0)
Hemoglobin: 13.2 g/dL (ref 12.0–15.0)
MCH: 31.5 pg (ref 26.0–34.0)
MCHC: 35.3 g/dL (ref 30.0–36.0)
MCV: 89.3 fL (ref 80.0–100.0)
Platelets: 161 10*3/uL (ref 150–400)
RBC: 4.19 MIL/uL (ref 3.87–5.11)
RDW: 11.8 % (ref 11.5–15.5)
WBC: 8.5 10*3/uL (ref 4.0–10.5)
nRBC: 0 % (ref 0.0–0.2)

## 2019-10-19 MED ORDER — AMOXICILLIN-POT CLAVULANATE 875-125 MG PO TABS
1.0000 | ORAL_TABLET | Freq: Once | ORAL | Status: AC
  Administered 2019-10-20: 1 via ORAL
  Filled 2019-10-19: qty 1

## 2019-10-19 MED ORDER — POLYETHYLENE GLYCOL 3350 17 GM/SCOOP PO POWD: 17.0000 g | Freq: Every day | ORAL | 0 refills | Status: DC | PRN

## 2019-10-19 MED ORDER — HYDROMORPHONE HCL 1 MG/ML IJ SOLN
1.0000 mg | Freq: Once | INTRAMUSCULAR | Status: AC
  Administered 2019-10-19: 1 mg via INTRAVENOUS
  Filled 2019-10-19: qty 1

## 2019-10-19 MED ORDER — AMOXICILLIN-POT CLAVULANATE 875-125 MG PO TABS: 1.0000 | ORAL_TABLET | Freq: Two times a day (BID) | ORAL | 0 refills | Status: AC

## 2019-10-19 NOTE — ED Provider Notes (Signed)
[facility] Emergency Department Provider Note  Time seen: 10:20 PM  I have reviewed the triage vital signs and the nursing notes.   HISTORY  Chief Complaint Back Pain and Constipation   HPI Kimberly Cook is a 74 y.o. female with a past medical history of CAD, depression, hypertension, reflux, hyperlipidemia, CVA, chronic back pain, presents to the emergency department for back pain.  According to the patient she has been dealing with back pain that has progressively worsened for many years now.  States since this past April it has been severe.  Patient states she is scheduled for an operation in April to help with her back pain.  Patient takes chronic pain medications.  Patient denies any falls or trauma.  No abdominal pain.  Does state 1 week of constipation which is causing her some discomfort as well.  Denies any nausea or vomiting.  States she did have a small bowel movement yesterday.  Currently only takes Colace.  Patient states her pain medication at home was not touching her pain tonight so she came to the emergency department for evaluation.  Describes her pain as severe 10/10 located across her lower back.   When asked if the pain is worse tonight, the patient states the pain has been this bad over the past several weeks.  Past Medical History:  Diagnosis Date  . Allergy   . Arthritis   . Coronary artery disease 04/2017   Mild to moderate CAD in LAD/diagonal by CTA (CT-FFR of apical LAD 0.79).  . Depression   . Diverticulosis   . Essential hypertension    Normal cardiolite 05/2006 EF 71%  . GERD (gastroesophageal reflux disease)   . Headache   . History of shingles   . Hyperlipidemia   . Hypothyroidism   . Mini stroke (HCC) 2011   . Occipital neuralgia   . Prediabetes   . Stroke (HCC)   . Stroke [facility])    MRI 04/2008 + left sup. frontal gyrus possibly puntate infarct   . Urinary tract infection   . Vitamin D deficiency     Patient Active  Problem List   Diagnosis Date Noted  . Lumbar herniated disc 06/09/2019  . Disc degeneration, lumbar 06/09/2019  . Spinal stenosis of cervical region 05/07/2019  . Spondylosis 05/07/2019  . Recurrent UTI 05/07/2019  . Abnormal MRI, lumbar spine 02/18/2019  . Abnormal gait 02/18/2019  . Chronic back pain 02/18/2019  . Osteoarthritis 10/09/2018  . Overactive bladder 10/09/2018  . Chronic midline low back pain 04/22/2018  . Chronic pain of left knee 04/22/2018  . Toe fracture, left 03/10/2018  . Lightheadedness 01/21/2018  . PAC (premature atrial contraction) 01/19/2018  . PVC's (premature ventricular contractions) 01/19/2018  . Hernia, paraesophageal 12/16/2017  . Kidney cysts 12/16/2017  . CKD (chronic kidney disease) stage 3, GFR 30-59 ml/min 12/16/2017  . Carotid artery stenosis 12/16/2017  . Chronic pain 12/16/2017  . Osteopenia 12/16/2017  . DDD (degenerative disc disease), cervical 12/16/2017  . Labile hypertension 09/05/2017  . Anxiety and depression 08/27/2017  . Insomnia 08/27/2017  . Syncope 08/27/2017  . Bilateral occipital neuralgia 08/27/2017  . Lung nodule 06/30/2017  . Coronary artery disease of native artery of native heart with stable angina pectoris (HCC) 06/03/2017  . Shortness of breath 04/11/2017  . Labile blood pressure 03/08/2017  . Syncope, near 03/08/2017  . Palpitations 03/08/2017  . Orthostatic lightheadedness 03/08/2017  . Essential hypertension   . S/P TKR (total knee replacement) 04/17/2015  .  Morbid obesity (HCC) 09/21/2014  . Medication management 09/21/2014  . Hypothyroidism 02/17/2014  . Mixed hyperlipidemia   . GERD (gastroesophageal reflux disease)   . Vitamin D deficiency   . Depression     Past Surgical History:  Procedure Laterality Date  . ABDOMINAL HYSTERECTOMY    . BLADDER SURGERY     2003  . BREAST EXCISIONAL BIOPSY Right Over 20 years    Benign  . CHOLECYSTECTOMY    . gastroplication     . JOINT REPLACEMENT Left   .  KNEE ARTHROSCOPY Left 2011  . TONSILLECTOMY AND ADENOIDECTOMY    . TOTAL KNEE ARTHROPLASTY Left 04/17/2015   Procedure: LEFT TOTAL KNEE ARTHROPLASTY;  Surgeon: Durene Romans, MD;  Location: [facility];  Service: Orthopedics;  Laterality: Left;    Prior to Admission medications   Medication Sig Start Date End Date Taking? Authorizing Provider  aspirin EC 81 MG tablet Take 1 tablet (81 mg total) by mouth daily. 10/09/18   McLean-Scocuzza, Pasty Spillers, MD  atorvastatin (LIPITOR) 20 MG tablet Take 1 tablet (20 mg total) by mouth daily. 07/09/19   McLean-Scocuzza, Pasty Spillers, MD  Biotin 1 MG CAPS Take 1 mg by mouth daily.     [provider]  cetirizine (ZYRTEC) 5 MG tablet Take 1-2 tablets (5-10 mg total) by mouth at bedtime as needed for allergies. 08/11/19   McLean-Scocuzza, Pasty Spillers, MD  Cholecalciferol (VITAMIN D3) 125 MCG (5000 UT) TABS Take 1 tablet (5,000 Units total) by mouth daily. 10/09/18   McLean-Scocuzza, Pasty Spillers, MD  conjugated estrogens (PREMARIN) vaginal cream Place 1 Applicatorful vaginally 3 (three) times a week. 06/23/19   Vaillancourt, Lelon Mast, PA-C  diazepam (VALIUM) 2 MG tablet TAKE 1-2 TABLETS BY MOUTH 30 MINUTES BEFORE ESI 03/17/19   [provider]  diclofenac sodium (VOLTAREN) 1 % GEL 2 grams qid to upper body and 4 grams qid to lower body prn 01/01/19   McLean-Scocuzza, Pasty Spillers, MD  diltiazem (CARDIZEM SR) 60 MG 12 hr capsule Take 1 capsule (60 mg total) by mouth 2 (two) times daily. 10/09/18   McLean-Scocuzza, Pasty Spillers, MD  escitalopram (LEXAPRO) 5 MG tablet Take 1 tablet (5 mg total) by mouth daily. 10/09/18   McLean-Scocuzza, Pasty Spillers, MD  FLUAD QUADRIVALENT 0.5 ML injection  04/29/19   [provider]  fluticasone (FLONASE) 50 MCG/ACT nasal spray Place 2 sprays into both nostrils daily as needed for allergies or rhinitis. Use after nasal saline 11/17/18   McLean-Scocuzza, Pasty Spillers, MD  folic acid (FOLVITE) 400 MCG tablet Take 1 tablet (400 mcg total) by mouth daily.  SEPARATE ALL SUPPLEMENTS TO LUNCH OR DINNER AND PRILOSEC NOT TO MESS W/THYROID MED 12/10/18   McLean-Scocuzza, Pasty Spillers, MD  fosfomycin (MONUROL) 3 g PACK  06/10/19   [provider]  gabapentin (NEURONTIN) 100 MG capsule 1 pill in the am and 2 pills qhs 05/07/19   McLean-Scocuzza, Pasty Spillers, MD  hydrALAZINE (APRESOLINE) 10 MG tablet Take 2 tablets (20 mg total) by mouth 2 (two) times daily as needed. BP>140/>90 11/02/18   McLean-Scocuzza, Pasty Spillers, MD  hydrochlorothiazide (MICROZIDE) 12.5 MG capsule Take 1 capsule (12.5 mg total) by mouth daily. In am 07/09/19   McLean-Scocuzza, Pasty Spillers, MD  HYDROcodone-acetaminophen (NORCO/VICODIN) 5-325 MG tablet Take 0.5-1 tablets by mouth daily as needed. 04/29/19   [provider]  levothyroxine (SYNTHROID, LEVOTHROID) 88 MCG tablet Take 1 tablet (88 mcg total) by mouth daily before breakfast. Skip sundays 10/09/18   McLean-Scocuzza, Pasty Spillers,  MD  LORazepam (ATIVAN) 0.5 MG tablet Take 1 tablet (0.5 mg total) by mouth 2 (two) times daily as needed for anxiety or sleep. Or 1 mg at night for sleep 07/09/19   McLean-Scocuzza, Nino Glow, MD  mirabegron ER (MYRBETRIQ) 25 MG TB24 tablet Take 1 tablet (25 mg total) by mouth daily. 10/07/19   Vaillancourt, Samantha, PA-C  montelukast (SINGULAIR) 10 MG tablet Take 1 tablet (10 mg total) by mouth at bedtime. 02/09/19   McLean-Scocuzza, Nino Glow, MD  mupirocin ointment (BACTROBAN) 2 % Apply 1 application topically 2 (two) times daily. Right lower leg 10/09/18   McLean-Scocuzza, Nino Glow, MD  omeprazole (PRILOSEC) 40 MG capsule Take 1 capsule (40 mg total) by mouth daily. 10/09/18   McLean-Scocuzza, Nino Glow, MD  oxyCODONE-acetaminophen (PERCOCET/ROXICET) 5-325 MG tablet Take by mouth. 07/20/19   [provider]  oxyCODONE-acetaminophen (PERCOCET/ROXICET) 5-325 MG tablet Take 1 tablet by mouth 2 (two) times daily as needed. 07/20/19   [provider]  sodium chloride (OCEAN) 0.65 % SOLN nasal spray Place 2 sprays into  both nostrils as needed for congestion. Use 1st 11/17/18   McLean-Scocuzza, Nino Glow, MD  tiZANidine (ZANAFLEX) 4 MG tablet Take 1 tablet (4 mg total) by mouth 2 (two) times daily as needed for muscle spasms. 01/01/19   McLean-Scocuzza, Nino Glow, MD  traZODone (DESYREL) 50 MG tablet Take 1-2 tablets (50-100 mg total) by mouth at bedtime as needed for sleep. 1 hr before bed 08/05/19   McLean-Scocuzza, Nino Glow, MD  vitamin B-12 (CYANOCOBALAMIN) 1000 MCG tablet Take 1 tablet (1,000 mcg total) by mouth daily. 10/09/18   McLean-Scocuzza, Nino Glow, MD    No Known Allergies  Family History  Problem Relation Age of Onset  . Heart disease Mother   . Hypertension Mother   . Diabetes Mother   . Heart attack Mother 79  . Heart disease Father   . Heart attack Father 64  . Breast cancer Maternal Aunt   . Heart attack Brother     Social History Social History   Tobacco Use  . Smoking status: Passive Smoke Exposure - Never Smoker  . Smokeless tobacco: Never Used  . Tobacco comment: husbands and children smoked in home.   Substance Use Topics  . Alcohol use: No  . Drug use: No    Review of Systems Constitutional: Negative for fever. Cardiovascular: Negative for chest pain. Respiratory: Negative for shortness of breath. Gastrointestinal: Negative for abdominal pain Genitourinary: Negative for urinary compaints Musculoskeletal: Positive for back pain. Skin: Negative for skin complaints  Neurological: Negative for headache All other ROS negative  ____________________________________________   PHYSICAL EXAM:  VITAL SIGNS: ED Triage Vitals [10/19/19 2132]  Enc Vitals Group     BP (!) 158/98     Pulse Rate 93     Resp 20     Temp 99.3 F (37.4 C)     Temp Source Oral     SpO2 98 %     Weight 165 lb (74.8 kg)     Height 5\' 3"  (1.6 m)     Head Circumference      Peak Flow      Pain Score      Pain Loc      Pain Edu?      Excl. in [place]?     Constitutional: Alert and oriented. Well  appearing and in no distress. Eyes: Normal exam ENT      Head: Normocephalic and atraumatic.  Mouth/Throat: Mucous membranes are moist. Cardiovascular: Normal rate, regular rhythm. Respiratory: Normal respiratory effort without tachypnea nor retractions. Breath sounds are clear  Gastrointestinal: Soft and nontender. No distention. Musculoskeletal: Patient has moderate tenderness to palpation of the lumbar spine both midline and paraspinal. Neurologic:  Normal speech and language. No gross focal neurologic deficits  Skin:  Skin is warm, dry and intact.  Psychiatric: Mood and affect are normal.  ____________________________________________    EKG  EKG viewed and interpreted by myself shows a normal sinus rhythm 89 bpm with a narrow QRS, normal axis, normal intervals, no concerning ST changes.  ____________________________________________    RADIOLOGY  CT shows uncomplicated sigmoid diverticulitis.  Some bladder wall thickening.  ____________________________________________   INITIAL IMPRESSION / ASSESSMENT AND PLAN / ED COURSE  Pertinent labs & imaging results that were available during my care of the patient were reviewed by me and considered in my medical decision making (see chart for details).   Patient presents to the emergency department for acute on chronic back pain.  Patient states that has been getting much worse since last April.  Patient denies any acute worsening tonight but states her pain medication has not been enough for her back pain.  Patient tearful at time saying that she does not want to go home and pain.  Patient states her surgery is not scheduled until April.  Differential would include acute on chronic back pain, spasm, constipation, intra-abdominal pathology.  We will check labs, CT imaging and continue to closely monitor.  We will dose pain and nausea medication while awaiting results.  Patient agreeable to plan of care.  Lab work is largely  nonrevealing.  Patient does have leukocytes within her urine however she states she recently saw her urologist has chronic cystitis and was told not to go on antibiotics.  We will send a urine culture.  Patient CT scan however is consistent with uncomplicated sigmoid diverticulitis.  We will place the patient on Augmentin.  Given the patient's constipation we will also place the patient on MiraLAX.  Patient states her pain is much improved from arrival.  We will discharge the patient home with PCP follow-up.  Patient agreeable to plan of care.  Kimberly Cook was evaluated in Emergency Department on 10/19/2019 for the symptoms described in the history of present illness. She was evaluated in the context of the global COVID-19 pandemic, which necessitated consideration that the patient might be at risk for infection with the SARS-CoV-2 virus that causes COVID-19. Institutional protocols and algorithms that pertain to the evaluation of patients at risk for COVID-19 are in a state of rapid change based on information released by regulatory bodies including the CDC and federal and state organizations. These policies and algorithms were followed during the patient's care in the ED.  ____________________________________________   FINAL CLINICAL IMPRESSION(S) / ED DIAGNOSES  Back pain Diverticulitis   Minna Antis, MD 10/19/19 2333

## 2019-10-19 NOTE — ED Triage Notes (Addendum)
Pt to ED reporting chronic back pain that has been worsening. Pt has a consult for surgery in April but is tearful in triage reporting the pain has become unmanageable with her current pain medications. Pt also reporting it has been a week since her last BM and she is starting to have abd pain.  No NV or fevers reported.

## 2019-10-20 NOTE — ED Notes (Signed)
Patient discharged to home per MD order. Patient in stable condition, and deemed medically cleared by ED provider for discharge. Discharge instructions reviewed with patient/family using "Teach Back"; verbalized understanding of medication education and administration, and information about follow-up care. Denies further concerns. ° °

## 2019-10-21 ENCOUNTER — Telehealth: Payer: Self-pay | Admitting: Internal Medicine

## 2019-10-21 ENCOUNTER — Telehealth (INDEPENDENT_AMBULATORY_CARE_PROVIDER_SITE_OTHER): Payer: Medicare PPO | Admitting: Internal Medicine

## 2019-10-21 DIAGNOSIS — T402X5A Adverse effect of other opioids, initial encounter: Secondary | ICD-10-CM

## 2019-10-21 DIAGNOSIS — K5903 Drug induced constipation: Secondary | ICD-10-CM | POA: Diagnosis not present

## 2019-10-21 MED ORDER — POLYETHYLENE GLYCOL 3350 17 GM/SCOOP PO POWD: 17.0000 g | Freq: Every day | ORAL | 11 refills | Status: DC | PRN

## 2019-10-21 MED ORDER — NALOXEGOL OXALATE 12.5 MG PO TABS: 12.5000 mg | ORAL_TABLET | Freq: Every day | ORAL | 0 refills | Status: DC

## 2019-10-21 NOTE — Telephone Encounter (Signed)
Please advise 

## 2019-10-21 NOTE — Telephone Encounter (Signed)
Left message to return call 

## 2019-10-21 NOTE — Telephone Encounter (Signed)
Pt called in and said she hasn't had a bowel movement for 8 days. She went to ER the other day and she still can't go. She has tried over the counter medicines and needs someone to call her back.

## 2019-10-21 NOTE — Telephone Encounter (Signed)
Patient agreeable to Consult fee. She is currently trying the warm prune juice and has had a few movements today. She states she is still in a lot of abdominal pain and discomfort.       Documentation   Tilford Pillar, CMA 43 minutes ago (4:09 PM)     Left message to return call.      Documentation    You  Tilford Pillar, CMA 3 hours ago (1:33 PM)  TM   Likely due to pain meds if she was given them  Is she agreeable to fee for telephone consult to treat?  We can given her Miralax daily with colace and linzess as well?  Increase water  Warm prune juice  If still unable may need to go to ED for enema  TMS      Documentation    Tilford Pillar, CMA  You 3 hours ago (1:27 PM)     Please advise        Constipation  C/w narcotic induced constipation  Cont warm prune juice with miralax daily prn  Rx naloxegol 12.5-25 mg  mg qd x 3 days  Agreeable to telephone fee   TMS Time spent 5-10 minutes

## 2019-10-21 NOTE — Telephone Encounter (Signed)
Likely due to pain meds if she was given them  Is she agreeable to fee for telephone consult to treat?  We can given her Miralax daily with colace and linzess as well?  Increase water  Warm prune juice  If still unable may need to go to ED for enema  TMS

## 2019-10-21 NOTE — Telephone Encounter (Signed)
See note   TMS 

## 2019-10-21 NOTE — Telephone Encounter (Signed)
Patient agreeable to Consult fee. She is currently trying the warm prune juice and has had a few movements today. She states she is still in a lot of abdominal pain and discomfort.

## 2019-10-22 LAB — URINE CULTURE: Culture: >=100000 — AB

## 2019-11-03 ENCOUNTER — Ambulatory Visit
Admission: RE | Admit: 2019-11-03 | Discharge: 2019-11-03 | Disposition: A | Discharge status: Home / Self Care | Payer: Medicare PPO | Source: Ambulatory Visit | Attending: Neurosurgery | Admitting: Neurosurgery

## 2019-11-03 ENCOUNTER — Other Ambulatory Visit: Payer: Self-pay

## 2019-11-03 DIAGNOSIS — M4014 Other secondary kyphosis, thoracic region: Secondary | ICD-10-CM

## 2019-11-03 DIAGNOSIS — M48061 Spinal stenosis, lumbar region without neurogenic claudication: Secondary | ICD-10-CM | POA: Diagnosis not present

## 2019-11-03 DIAGNOSIS — M419 Scoliosis, unspecified: Secondary | ICD-10-CM | POA: Diagnosis not present

## 2019-11-03 DIAGNOSIS — M546 Pain in thoracic spine: Secondary | ICD-10-CM | POA: Diagnosis not present

## 2019-11-03 DIAGNOSIS — M5124 Other intervertebral disc displacement, thoracic region: Secondary | ICD-10-CM | POA: Diagnosis not present

## 2019-11-04 DIAGNOSIS — M8588 Other specified disorders of bone density and structure, other site: Secondary | ICD-10-CM | POA: Diagnosis not present

## 2019-11-11 DIAGNOSIS — M419 Scoliosis, unspecified: Secondary | ICD-10-CM | POA: Diagnosis not present

## 2019-11-11 DIAGNOSIS — M48061 Spinal stenosis, lumbar region without neurogenic claudication: Secondary | ICD-10-CM | POA: Diagnosis not present

## 2019-11-11 DIAGNOSIS — G894 Chronic pain syndrome: Secondary | ICD-10-CM | POA: Diagnosis not present

## 2019-11-16 DIAGNOSIS — M5136 Other intervertebral disc degeneration, lumbar region: Secondary | ICD-10-CM | POA: Diagnosis not present

## 2019-11-16 DIAGNOSIS — M5416 Radiculopathy, lumbar region: Secondary | ICD-10-CM | POA: Diagnosis not present

## 2019-11-16 DIAGNOSIS — M47816 Spondylosis without myelopathy or radiculopathy, lumbar region: Secondary | ICD-10-CM | POA: Diagnosis not present

## 2019-11-16 DIAGNOSIS — M48062 Spinal stenosis, lumbar region with neurogenic claudication: Secondary | ICD-10-CM | POA: Diagnosis not present

## 2019-11-17 ENCOUNTER — Telehealth: Payer: Self-pay | Admitting: Internal Medicine

## 2019-11-17 NOTE — Telephone Encounter (Signed)
3 attempts to schedule fu appt from recall list.   Deleting recall.   

## 2019-11-19 ENCOUNTER — Other Ambulatory Visit: Payer: Self-pay | Admitting: Internal Medicine

## 2019-11-19 DIAGNOSIS — I10 Essential (primary) hypertension: Secondary | ICD-10-CM

## 2019-11-19 DIAGNOSIS — R002 Palpitations: Secondary | ICD-10-CM

## 2019-11-19 MED ORDER — DILTIAZEM HCL ER 60 MG PO CP12: 60.0000 mg | ORAL_CAPSULE | Freq: Two times a day (BID) | ORAL | 3 refills | Status: DC

## 2019-11-22 ENCOUNTER — Other Ambulatory Visit: Payer: Self-pay

## 2019-11-24 ENCOUNTER — Other Ambulatory Visit: Payer: Self-pay

## 2019-11-24 ENCOUNTER — Ambulatory Visit (INDEPENDENT_AMBULATORY_CARE_PROVIDER_SITE_OTHER): Payer: Medicare PPO | Admitting: Internal Medicine

## 2019-11-24 ENCOUNTER — Encounter: Payer: Self-pay | Admitting: Internal Medicine

## 2019-11-24 VITALS — BP 112/80 | HR 84 | Temp 98.1°F | Ht 63.0 in | Wt 174.2 lb

## 2019-11-24 DIAGNOSIS — E785 Hyperlipidemia, unspecified: Secondary | ICD-10-CM

## 2019-11-24 DIAGNOSIS — M479 Spondylosis, unspecified: Secondary | ICD-10-CM | POA: Diagnosis not present

## 2019-11-24 DIAGNOSIS — G8929 Other chronic pain: Secondary | ICD-10-CM

## 2019-11-24 DIAGNOSIS — M4802 Spinal stenosis, cervical region: Secondary | ICD-10-CM

## 2019-11-24 DIAGNOSIS — N3000 Acute cystitis without hematuria: Secondary | ICD-10-CM

## 2019-11-24 DIAGNOSIS — Z1329 Encounter for screening for other suspected endocrine disorder: Secondary | ICD-10-CM

## 2019-11-24 DIAGNOSIS — E876 Hypokalemia: Secondary | ICD-10-CM | POA: Diagnosis not present

## 2019-11-24 DIAGNOSIS — R739 Hyperglycemia, unspecified: Secondary | ICD-10-CM | POA: Diagnosis not present

## 2019-11-24 DIAGNOSIS — I10 Essential (primary) hypertension: Secondary | ICD-10-CM | POA: Diagnosis not present

## 2019-11-24 DIAGNOSIS — Z1231 Encounter for screening mammogram for malignant neoplasm of breast: Secondary | ICD-10-CM

## 2019-11-24 DIAGNOSIS — M810 Age-related osteoporosis without current pathological fracture: Secondary | ICD-10-CM

## 2019-11-24 DIAGNOSIS — M5442 Lumbago with sciatica, left side: Secondary | ICD-10-CM

## 2019-11-24 LAB — LIPID PANEL
Cholesterol: 125 mg/dL (ref 0–200)
HDL: 46.4 mg/dL (ref >39.00)
LDL Cholesterol: 47 mg/dL (ref 0–99)
NonHDL: 78.81
Total CHOL/HDL Ratio: 3
Triglycerides: 158 mg/dL — ABNORMAL HIGH (ref 0.0–149.0)
VLDL: 31.6 mg/dL (ref 0.0–40.0)

## 2019-11-24 LAB — HEMOGLOBIN A1C: Hgb A1c MFr Bld: 5.1 % (ref 4.6–6.5)

## 2019-11-24 LAB — TSH: TSH: 2.93 u[IU]/mL (ref 0.35–4.50)

## 2019-11-24 MED ORDER — POTASSIUM CHLORIDE ER 10 MEQ PO TBCR: 10.0000 meq | EXTENDED_RELEASE_TABLET | Freq: Every day | ORAL | 3 refills | Status: DC

## 2019-11-24 MED ORDER — FOSFOMYCIN TROMETHAMINE 3 G PO PACK: 3.0000 g | PACK | Freq: Once | ORAL | 0 refills | Status: AC

## 2019-11-24 MED ORDER — GABAPENTIN 300 MG PO CAPS: ORAL_CAPSULE | ORAL | Status: DC

## 2019-11-24 NOTE — Progress Notes (Signed)
Chief Complaint  Patient presents with  . Follow-up   F/u  1. Chronic back pain with abnormal MRI lumbar Dr. Daun Peacock rec spinal stimulator with pain clinic and extensive thoracolumbar resconstruction fusion would be risky. She is in chronic pain daily 10/10 pain clinic appt 12/07/19 pain rad to left hip/leg and right leg some  On gabapentin 300 1 bid and 2 qhs and percocet 5-325 mg 1/2 to 1 pill bid prn and not helping  Injections with Dr. Sharlet Salina did not help Legs at time feel weak   2. Osteoporosis on fosamax 70 1x per week  3. H/o UTI E coli with sx's currently  4. HTN BP controlled today on dilt 60 mg bid, hydralazine 10 mg prn, hctz 12.5 mg qd   Review of Systems  Constitutional: Negative for weight loss.  HENT: Negative for hearing loss.   Eyes: Negative for blurred vision.  Respiratory: Negative for shortness of breath.   Cardiovascular: Negative for chest pain.  Gastrointestinal: Negative for abdominal pain.  Musculoskeletal: Positive for back pain and falls.  Skin: Negative for rash.  Neurological: Positive for weakness.  Psychiatric/Behavioral: Negative for memory loss.   Past Medical History:  Diagnosis Date  . Allergy   . Arthritis   . Coronary artery disease 04/2017   Mild to moderate CAD in LAD/diagonal by CTA (CT-FFR of apical LAD 0.79).  . Depression   . Diverticulosis   . Essential hypertension    Normal cardiolite 05/2006 EF 71%  . GERD (gastroesophageal reflux disease)   . Headache   . History of shingles   . Hyperlipidemia   . Hypothyroidism   . Mini stroke ([place]) 2011   . Occipital neuralgia   . Prediabetes   . Stroke ([place])   . Stroke [facility])    MRI 04/2008 + left sup. frontal gyrus possibly puntate infarct   . Urinary tract infection   . Vitamin D deficiency    Past Surgical History:  Procedure Laterality Date  . ABDOMINAL HYSTERECTOMY    . BLADDER SURGERY     2003  . BREAST EXCISIONAL BIOPSY Right Over 20 years    Benign  . CHOLECYSTECTOMY     . gastroplication     . JOINT REPLACEMENT Left   . KNEE ARTHROSCOPY Left 2011  . TONSILLECTOMY AND ADENOIDECTOMY    . TOTAL KNEE ARTHROPLASTY Left 04/17/2015   Procedure: LEFT TOTAL KNEE ARTHROPLASTY;  Surgeon: Paralee Cancel, MD;  Location: [facility];  Service: Orthopedics;  Laterality: Left;   Family History  Problem Relation Age of Onset  . Heart disease Mother   . Hypertension Mother   . Diabetes Mother   . Heart attack Mother 29  . Heart disease Father   . Heart attack Father 20  . Breast cancer Maternal Aunt   . Heart attack Brother    Social History   Socioeconomic History  . Marital status: Widowed    Spouse name: Not on file  . Number of children: Not on file  . Years of education: Not on file  . Highest education level: Not on file  Occupational History  . Not on file  Tobacco Use  . Smoking status: Passive Smoke Exposure - Never Smoker  . Smokeless tobacco: Never Used  . Tobacco comment: husbands and children smoked in home.   Substance and Sexual Activity  . Alcohol use: No  . Drug use: No  . Sexual activity: Never  Other Topics Concern  . Not on file  Social History  Narrative   Widowed    [place]ives [place]    Has kids and grandson    Social Determinants of Health   Financial Resource Strain:   . Difficulty of Paying Living Expenses:   Food Insecurity:   . Worried About Programme researcher, broadcasting/film/video in the Last Year:   . Barista in the Last Year:   Transportation Needs:   . Freight forwarder (Medical):   Marland Kitchen Lack of Transportation (Non-Medical):   Physical Activity:   . Days of Exercise per Week:   . Minutes of Exercise per Session:   Stress:   . Feeling of Stress :   Social Connections:   . Frequency of Communication with Friends and Family:   . Frequency of Social Gatherings with Friends and Family:   . Attends Religious Services:   . Active Member of Clubs or Organizations:   . Attends Banker Meetings:   Marland Kitchen Marital Status:    Intimate Partner Violence:   . Fear of Current or Ex-Partner:   . Emotionally Abused:   Marland Kitchen Physically Abused:   . Sexually Abused:    Current Meds  Medication Sig  . alendronate (FOSAMAX) 70 MG tablet Take 70 mg by mouth once a week.  Marland Kitchen aspirin EC 81 MG tablet Take 1 tablet (81 mg total) by mouth daily.  Marland Kitchen atorvastatin (LIPITOR) 20 MG tablet Take 1 tablet (20 mg total) by mouth daily.  . Biotin 1 MG CAPS Take 1 mg by mouth daily.   Marland Kitchen CALCIUM PO Take by mouth daily.  . cetirizine (ZYRTEC) 10 MG tablet Take 5-10 mg by mouth at bedtime as needed.  . cetirizine (ZYRTEC) 5 MG tablet Take 1-2 tablets (5-10 mg total) by mouth at bedtime as needed for allergies.  . Cholecalciferol (VITAMIN D3) 125 MCG (5000 UT) TABS Take 1 tablet (5,000 Units total) by mouth daily.  Marland Kitchen conjugated estrogens (PREMARIN) vaginal cream Place 1 Applicatorful vaginally 3 (three) times a week.  . diazepam (VALIUM) 2 MG tablet TAKE 1-2 TABLETS BY MOUTH 30 MINUTES BEFORE ESI  . diclofenac sodium (VOLTAREN) 1 % GEL 2 grams qid to upper body and 4 grams qid to lower body prn  . diltiazem (CARDIZEM SR) 60 MG 12 hr capsule Take 1 capsule (60 mg total) by mouth 2 (two) times daily.  Marland Kitchen escitalopram (LEXAPRO) 5 MG tablet Take 1 tablet (5 mg total) by mouth daily.  Marland Kitchen FLUAD QUADRIVALENT 0.5 ML injection   . fluticasone (FLONASE) 50 MCG/ACT nasal spray Place 2 sprays into both nostrils daily as needed for allergies or rhinitis. Use after nasal saline  . folic acid (FOLVITE) 400 MCG tablet Take 1 tablet (400 mcg total) by mouth daily. SEPARATE ALL SUPPLEMENTS TO LUNCH OR DINNER AND PRILOSEC NOT TO MESS W/THYROID MED  . gabapentin (NEURONTIN) 300 MG capsule 1 pill in the am and lunch and 2 pills qhs  . hydrALAZINE (APRESOLINE) 10 MG tablet Take 2 tablets (20 mg total) by mouth 2 (two) times daily as needed. BP>140/>90  . hydrochlorothiazide (MICROZIDE) 12.5 MG capsule Take 1 capsule (12.5 mg total) by mouth daily. In am  .  HYDROcodone-acetaminophen (NORCO/VICODIN) 5-325 MG tablet Take 0.5-1 tablets by mouth daily as needed.  Marland Kitchen levothyroxine (SYNTHROID, LEVOTHROID) 88 MCG tablet Take 1 tablet (88 mcg total) by mouth daily before breakfast. Skip sundays  . LORazepam (ATIVAN) 0.5 MG tablet Take 1 tablet (0.5 mg total) by mouth 2 (two) times daily as needed for  anxiety or sleep. Or 1 mg at night for sleep  . LOSARTAN POTASSIUM PO Take by mouth as needed.  . mirabegron ER (MYRBETRIQ) 25 MG TB24 tablet Take 1 tablet (25 mg total) by mouth daily.  . montelukast (SINGULAIR) 10 MG tablet Take 1 tablet (10 mg total) by mouth at bedtime.  . mupirocin ointment (BACTROBAN) 2 % Apply 1 application topically 2 (two) times daily. Right lower leg  . naloxegol oxalate (MOVANTIK) 12.5 MG TABS tablet Take 1-2 tablets (12.5-25 mg total) by mouth daily. Constipation x 3 days prn  . omeprazole (PRILOSEC) 40 MG capsule Take 1 capsule (40 mg total) by mouth daily.  Marland Kitchen oxyCODONE-acetaminophen (PERCOCET/ROXICET) 5-325 MG tablet Take by mouth.  . oxyCODONE-acetaminophen (PERCOCET/ROXICET) 5-325 MG tablet Take 0.5-1 tablets by mouth 2 (two) times daily as needed.   . polyethylene glycol powder (GLYCOLAX/MIRALAX) 17 GM/SCOOP powder Take 17 g by mouth daily as needed for moderate constipation.  . sodium chloride (OCEAN) 0.65 % SOLN nasal spray Place 2 sprays into both nostrils as needed for congestion. Use 1st  . tiZANidine (ZANAFLEX) 4 MG tablet Take 1 tablet (4 mg total) by mouth 2 (two) times daily as needed for muscle spasms.  . traZODone (DESYREL) 50 MG tablet Take 1-2 tablets (50-100 mg total) by mouth at bedtime as needed for sleep. 1 hr before bed  . vitamin B-12 (CYANOCOBALAMIN) 1000 MCG tablet Take 1 tablet (1,000 mcg total) by mouth daily.  . [DISCONTINUED] fosfomycin (MONUROL) 3 g PACK   . [DISCONTINUED] gabapentin (NEURONTIN) 100 MG capsule 1 pill in the am and 2 pills qhs (Patient taking differently: 300 mg. 1 pill in the am and 2  pills qhs)   No Known Allergies Recent Results (from the past 2160 hour(s))  Bladder Scan (Post Void Residual) in office     Status: None   Collection Time: 10/07/19  9:42 AM  Result Value Ref Range   Scan Result 10   Urinalysis, Complete     Status: Abnormal   Collection Time: 10/07/19  9:43 AM  Result Value Ref Range   Specific Gravity, UA 1.015 1.005 - 1.030   pH, UA 5.5 5.0 - 7.5   Color, UA Yellow Yellow   Appearance Ur Cloudy (A) Clear   Leukocytes,UA 2+ (A) Negative   Protein,UA Trace (A) Negative/Trace   Glucose, UA Negative Negative   Ketones, UA Negative Negative   RBC, UA Trace (A) Negative   Bilirubin, UA Negative Negative   Urobilinogen, Ur 0.2 0.2 - 1.0 mg/dL   Nitrite, UA Negative Negative   Microscopic Examination See below:   Microscopic Examination     Status: Abnormal   Collection Time: 10/07/19  9:43 AM   URINE  Result Value Ref Range   WBC, UA >30 (A) 0 - 5 /hpf   RBC 0-2 0 - 2 /hpf   Epithelial Cells (non renal) 0-10 0 - 10 /hpf   Bacteria, UA Many (A) None seen/Few  Lipase, blood     Status: None   Collection Time: 10/19/19 10:30 PM  Result Value Ref Range   Lipase 27 11 - 51 U/L    Comment: Performed at [facility], [address] [place], [place] [address]  Comprehensive metabolic panel     Status: Abnormal   Collection Time: 10/19/19 10:30 PM  Result Value Ref Range   Sodium 136 135 - 145 mmol/L   Potassium 3.3 (L) 3.5 - 5.1 mmol/L   Chloride 100 98 - 111 mmol/L  CO2 24 22 - 32 mmol/L   Glucose, Bld 116 (H) 70 - 99 mg/dL    Comment: Glucose reference range applies only to samples taken after fasting for at least 8 hours.   BUN 13 8 - 23 mg/dL   Creatinine, Ser 3.57 0.44 - 1.00 mg/dL   Calcium 9.1 8.9 - 01.7 mg/dL   Total Protein 7.4 6.5 - 8.1 g/dL   Albumin 4.0 3.5 - 5.0 g/dL   AST 17 15 - 41 U/L   ALT 16 0 - 44 U/L   Alkaline Phosphatase 48 38 - 126 U/L   Total Bilirubin 0.9 0.3 - 1.2 mg/dL   GFR calc non Af Amer >60 >60  mL/min   GFR calc Af Amer >60 >60 mL/min   Anion gap 12 5 - 15    Comment: Performed at [facility], [address] [place], [place] [address]  CBC     Status: None   Collection Time: 10/19/19 10:30 PM  Result Value Ref Range   WBC 8.5 4.0 - 10.5 K/uL   RBC 4.19 3.87 - 5.11 MIL/uL   Hemoglobin 13.2 12.0 - 15.0 g/dL   HCT 30.0 92.3 - 30.0 %   MCV 89.3 80.0 - 100.0 fL   MCH 31.5 26.0 - 34.0 pg   MCHC 35.3 30.0 - 36.0 g/dL   RDW 76.2 26.3 - 33.5 %   Platelets 161 150 - 400 K/uL   nRBC 0.0 0.0 - 0.2 %    Comment: Performed at [facility], [address] [place], [place] [address]  Urinalysis, Complete w Microscopic     Status: Abnormal   Collection Time: 10/19/19 10:30 PM  Result Value Ref Range   Color, Urine YELLOW (A) YELLOW   APPearance HAZY (A) CLEAR   Specific Gravity, Urine 1.010 1.005 - 1.030   pH 7.0 5.0 - 8.0   Glucose, UA NEGATIVE NEGATIVE mg/dL   Hgb urine dipstick NEGATIVE NEGATIVE   Bilirubin Urine NEGATIVE NEGATIVE   Ketones, ur NEGATIVE NEGATIVE mg/dL   Protein, ur NEGATIVE NEGATIVE mg/dL   Nitrite NEGATIVE NEGATIVE   Leukocytes,Ua LARGE (A) NEGATIVE   RBC / HPF 0-5 0 - 5 RBC/hpf   WBC, UA >50 (H) 0 - 5 WBC/hpf   Bacteria, UA FEW (A) NONE SEEN   Squamous Epithelial / LPF 0-5 0 - 5    Comment: Performed at [facility], [address], [place], [place] [address]  Urine Culture     Status: Abnormal   Collection Time: 10/19/19 10:30 PM   Specimen: Urine, Clean Catch  Result Value Ref Range   Specimen Description      URINE, CLEAN CATCH Performed at [facility], [address], [place], [place] [address]    Special Requests      NONE Performed at [facility], [address] [place], [place] [address]    Culture >=100,000 COLONIES/mL ESCHERICHIA COLI (A)    Report Status 10/22/2019 FINAL    Organism ID, Bacteria ESCHERICHIA COLI (A)       Susceptibility   Escherichia coli - MIC*     AMPICILLIN >=32 RESISTANT Resistant     CEFAZOLIN <=4 SENSITIVE Sensitive     CEFTRIAXONE <=0.25 SENSITIVE Sensitive     CIPROFLOXACIN >=4 RESISTANT Resistant     GENTAMICIN <=1 SENSITIVE Sensitive     IMIPENEM <=0.25 SENSITIVE Sensitive     NITROFURANTOIN <=16 SENSITIVE Sensitive     TRIMETH/SULFA >=320 RESISTANT Resistant  AMPICILLIN/SULBACTAM 16 INTERMEDIATE Intermediate     PIP/TAZO <=4 SENSITIVE Sensitive     * >=100,000 COLONIES/mL ESCHERICHIA COLI   Objective  Body mass index is 30.86 kg/m. Wt Readings from Last 3 Encounters:  11/24/19 174 lb 3.2 oz (79 kg)  10/19/19 164 lb 14.5 oz (74.8 kg)  10/07/19 170 lb (77.1 kg)   Temp Readings from Last 3 Encounters:  11/24/19 98.1 F (36.7 C) (Oral)  10/19/19 99.3 F (37.4 C) (Oral)  05/07/19 97.9 F (36.6 C) (Oral)   BP Readings from Last 3 Encounters:  11/24/19 112/80  10/20/19 (!) 145/88  10/07/19 (!) 148/74   Pulse Readings from Last 3 Encounters:  11/24/19 84  10/20/19 95  10/07/19 76    Physical Exam Vitals and nursing note reviewed.  Constitutional:      Appearance: Normal appearance. She is well-developed and well-groomed. She is obese.  HENT:     Head: Normocephalic and atraumatic.  Eyes:     Conjunctiva/sclera: Conjunctivae normal.     Pupils: Pupils are equal, round, and reactive to light.  Cardiovascular:     Rate and Rhythm: Normal rate and regular rhythm.     Heart sounds: Normal heart sounds. No murmur.  Pulmonary:     Effort: Pulmonary effort is normal.     Breath sounds: Normal breath sounds.  Skin:    General: Skin is warm and dry.  Neurological:     General: No focal deficit present.     Mental Status: She is alert and oriented to person, place, and time. Mental status is at baseline.     Comments: Walking with walker today    Psychiatric:        Attention and Perception: Attention and perception normal.        Mood and Affect: Mood and affect normal.        Speech: Speech normal.         Behavior: Behavior is cooperative.        Thought Content: Thought content normal.        Cognition and Memory: Cognition and memory normal.        Judgment: Judgment normal.     Assessment  Plan  Acute cystitis without hematuria - Plan: Urinalysis, Routine w reflex microscopic, Urine Culture, fosfomycin (MONUROL) 3 g PACK  Essential hypertension - Plan: Lipid panel, TSH Cont meds   Hyperlipidemia, unspecified hyperlipidemia type - Plan: Lipid panel  Hyperglycemia - Plan: Hemoglobin A1c  Thyroid disorder screening - Plan: TSH  Hypokalemia - Plan: potassium chloride (KLOR-CON) 10 MEQ tablet  Chronic bilateral low back pain with left-sided sciatica - Plan: gabapentin (NEURONTIN) 300 MG capsule F/u pain clinic 12/07/19  Spinal stenosis of cervical region - Plan: gabapentin (NEURONTIN) 300 MG capsule  Spondylosis - Plan: gabapentin (NEURONTIN) 300 MG capsule  Osteoporosis, unspecified osteoporosis type, unspecified pathological fracture presence  On fosamax   HM Flu shot utd9/24/20 Tdapconsider in future unable to find record pna23 utd prevnar had 06/16/15 shingrix 1/2 10/18/19likely will need to repeat series should have had 2nd dose in 11/2018 Will need again  Consider hep B vaccine covid vx 2/2   A1C 5.0  colonoscopy had 06/03/11 Dr. Bosie Clos diverticulosis f/u in 10 years  mammo [facility] 9/9/20neg  Out of age window pap last 01/26/10 neg   dexa 04/14/19 osteopenia with high frax score considering forteo x 20months then NS reeval for low  Back surgery vs vs Tymlos vs evenity  -pt c/o cost of med as of  07/23/19 could nof afford On fosamax since 07/2019 1x per week   hcv neg Lipid had 10/09/18  reviewed CT chestoverdue ordered by Dr. Kendrick Fries Will need dermatology in future$ is issue currently Provider: Dr. French Ana McLean-Scocuzza-Internal Medicine

## 2019-11-24 NOTE — Patient Instructions (Signed)
Discuss spinal cord stimulator and chronic pain meds with pain clinic 12/07/19

## 2019-11-26 ENCOUNTER — Telehealth: Payer: Self-pay | Admitting: Internal Medicine

## 2019-11-26 LAB — URINALYSIS, ROUTINE W REFLEX MICROSCOPIC
Bilirubin Urine: NEGATIVE
Glucose, UA: NEGATIVE
Ketones, ur: NEGATIVE
Nitrite: NEGATIVE
Specific Gravity, Urine: 1.014 (ref 1.001–1.03)
Squamous Epithelial / HPF: NONE SEEN /HPF (ref <=5)
pH: <=5 (ref 5.0–8.0)

## 2019-11-26 LAB — URINE CULTURE
MICRO NUMBER:: 10389649
SPECIMEN QUALITY:: ADEQUATE

## 2019-11-26 NOTE — Telephone Encounter (Signed)
Patient informed and verbalized understanding

## 2019-11-26 NOTE — Telephone Encounter (Signed)
McLean-Scocuzza, Kimberly Spillers, MD  11/24/2019 6:02 PM EDT    Triglycerides slight elevated rec healthy diet options less breads, fatty foods, sweets   A1C no prediabetes  Thyroid lab normal   Urine pending    Patient informed and verbalized understanding.  Please advise, on urine results.

## 2019-11-26 NOTE — Telephone Encounter (Signed)
Pt has recurrent UTI  Sent Fosfomycin x 1 has E coli UTI   Can urology sch appt for pt?    TMS

## 2019-11-26 NOTE — Telephone Encounter (Signed)
Pt called about lab results. °

## 2019-11-30 ENCOUNTER — Encounter: Payer: Self-pay | Admitting: Physician Assistant

## 2019-11-30 ENCOUNTER — Ambulatory Visit (INDEPENDENT_AMBULATORY_CARE_PROVIDER_SITE_OTHER): Payer: Medicare PPO | Admitting: Physician Assistant

## 2019-11-30 ENCOUNTER — Other Ambulatory Visit: Payer: Self-pay

## 2019-11-30 VITALS — BP 124/84 | HR 97 | Ht 64.0 in | Wt 174.0 lb

## 2019-11-30 DIAGNOSIS — R3915 Urgency of urination: Secondary | ICD-10-CM

## 2019-11-30 DIAGNOSIS — N390 Urinary tract infection, site not specified: Secondary | ICD-10-CM

## 2019-11-30 LAB — URINALYSIS, COMPLETE
Bilirubin, UA: NEGATIVE
Glucose, UA: NEGATIVE
Ketones, UA: NEGATIVE
Nitrite, UA: NEGATIVE
Protein,UA: NEGATIVE
RBC, UA: NEGATIVE
Specific Gravity, UA: 1.02 (ref 1.005–1.030)
Urobilinogen, Ur: 0.2 mg/dL (ref 0.2–1.0)
pH, UA: 5.5 (ref 5.0–7.5)

## 2019-11-30 LAB — MICROSCOPIC EXAMINATION

## 2019-11-30 LAB — BLADDER SCAN AMB NON-IMAGING: Scan Result: 0

## 2019-11-30 MED ORDER — MIRABEGRON ER 50 MG PO TB24: 50.0000 mg | ORAL_TABLET | Freq: Every day | ORAL | 11 refills | Status: DC

## 2019-11-30 NOTE — Progress Notes (Signed)
11/30/2019 6:32 PM   Kimberly Cook 1946-07-05 [id number]  CC: UTI follow-up  HPI: Kimberly Cook is a 74 y.o. female with PMH OAB on Myrbetriq 25 mg; asymptomatic bacteriuria versus recurrent UTI on probiotics, cranberry supplements, and Colace bowel regimen; the sensation of incomplete bladder emptying; and chronic back pain who presents today for follow-up of possible UTI. She was seen by her PCP on 11/24/2019 for routine follow-up. At that time, she reported dysuria, urgency, and frequency, consistent with prior reports. Urine culture grew MDR E. coli. Patient was prescribed fosfomycin x1 dose and counseled to follow-up in our clinic.  Today, patient reports resolution of dysuria after taking fosfomycin. She reports stable urinary urgency and frequency with urge incontinence on Myrbetriq. She is primarily bothered by her urgency and there is some element of functional incontinence, as she reports having difficulty physically ambulating to the restroom due to her chronic pain. Overall, she reports daytime frequency x5 and nocturia x3. She does report some persistent stinging at the urethral meatus. She previously failed a trial of Premarin cream due to burning with application.  In-office UA today positive for trace leukocyte esterase; urine microscopy with 11-30 WBCs/HPF. PVR 48mL.  PMH: Past Medical History:  Diagnosis Date  . Allergy   . Arthritis   . Coronary artery disease 04/2017   Mild to moderate CAD in LAD/diagonal by CTA (CT-FFR of apical LAD 0.79).  . Depression   . Diverticulosis   . Essential hypertension    Normal cardiolite 05/2006 EF 71%  . GERD (gastroesophageal reflux disease)   . Headache   . History of shingles   . Hyperlipidemia   . Hypothyroidism   . Mini stroke (HCC) 2011   . Occipital neuralgia   . Prediabetes   . Stroke (HCC)   . Stroke [facility])    MRI 04/2008 + left sup. frontal gyrus possibly puntate infarct   . Urinary tract infection   .  Vitamin D deficiency     Surgical History: Past Surgical History:  Procedure Laterality Date  . ABDOMINAL HYSTERECTOMY    . BLADDER SURGERY     2003  . BREAST EXCISIONAL BIOPSY Right Over 20 years    Benign  . CHOLECYSTECTOMY    . gastroplication     . JOINT REPLACEMENT Left   . KNEE ARTHROSCOPY Left 2011  . TONSILLECTOMY AND ADENOIDECTOMY    . TOTAL KNEE ARTHROPLASTY Left 04/17/2015   Procedure: LEFT TOTAL KNEE ARTHROPLASTY;  Surgeon: Durene Romans, MD;  Location: [facility];  Service: Orthopedics;  Laterality: Left;    Home Medications:  Allergies as of 11/30/2019   No Known Allergies     Medication List       Accurate as of November 30, 2019  6:32 PM. If you have any questions, ask your nurse or doctor.        alendronate 70 MG tablet Commonly known as: FOSAMAX Take 70 mg by mouth once a week.   aspirin EC 81 MG tablet Take 1 tablet (81 mg total) by mouth daily.   atorvastatin 20 MG tablet Commonly known as: LIPITOR Take 1 tablet (20 mg total) by mouth daily.   Biotin 1 MG Caps Take 1 mg by mouth daily.   CALCIUM PO Take by mouth daily.   cetirizine 5 MG tablet Commonly known as: ZYRTEC Take 1-2 tablets (5-10 mg total) by mouth at bedtime as needed for allergies.   cetirizine 10 MG tablet Commonly known as: ZYRTEC Take 5-10 mg  by mouth at bedtime as needed.   diazepam 2 MG tablet Commonly known as: VALIUM TAKE 1-2 TABLETS BY MOUTH 30 MINUTES BEFORE ESI   diclofenac sodium 1 % Gel Commonly known as: Voltaren 2 grams qid to upper body and 4 grams qid to lower body prn   diltiazem 60 MG 12 hr capsule Commonly known as: CARDIZEM SR Take 1 capsule (60 mg total) by mouth 2 (two) times daily.   escitalopram 5 MG tablet Commonly known as: LEXAPRO Take 1 tablet (5 mg total) by mouth daily.   Fluad Quadrivalent 0.5 ML injection Generic drug: influenza vaccine adjuvanted   fluticasone 50 MCG/ACT nasal spray Commonly known as: FLONASE Place 2 sprays into  both nostrils daily as needed for allergies or rhinitis. Use after nasal saline   folic acid 419 MCG tablet Commonly known as: FOLVITE Take 1 tablet (400 mcg total) by mouth daily. SEPARATE ALL SUPPLEMENTS TO LUNCH OR DINNER AND PRILOSEC NOT TO MESS W/THYROID MED   gabapentin 300 MG capsule Commonly known as: NEURONTIN 1 pill in the am and lunch and 2 pills qhs   hydrALAZINE 10 MG tablet Commonly known as: APRESOLINE Take 2 tablets (20 mg total) by mouth 2 (two) times daily as needed. BP>140/>90   hydrochlorothiazide 12.5 MG capsule Commonly known as: Microzide Take 1 capsule (12.5 mg total) by mouth daily. In am   HYDROcodone-acetaminophen 5-325 MG tablet Commonly known as: NORCO/VICODIN Take 0.5-1 tablets by mouth daily as needed.   levothyroxine 88 MCG tablet Commonly known as: SYNTHROID Take 1 tablet (88 mcg total) by mouth daily before breakfast. Skip sundays   LORazepam 0.5 MG tablet Commonly known as: Ativan Take 1 tablet (0.5 mg total) by mouth 2 (two) times daily as needed for anxiety or sleep. Or 1 mg at night for sleep   LOSARTAN POTASSIUM PO Take by mouth as needed.   mirabegron ER 50 MG Tb24 tablet Commonly known as: MYRBETRIQ Take 1 tablet (50 mg total) by mouth daily. What changed:   medication strength  how much to take Changed by: Debroah Loop, PA-C   montelukast 10 MG tablet Commonly known as: SINGULAIR Take 1 tablet (10 mg total) by mouth at bedtime.   mupirocin ointment 2 % Commonly known as: Bactroban Apply 1 application topically 2 (two) times daily. Right lower leg   naloxegol oxalate 12.5 MG Tabs tablet Commonly known as: MOVANTIK Take 1-2 tablets (12.5-25 mg total) by mouth daily. Constipation x 3 days prn   omeprazole 40 MG capsule Commonly known as: PRILOSEC Take 1 capsule (40 mg total) by mouth daily.   oxyCODONE-acetaminophen 5-325 MG tablet Commonly known as: PERCOCET/ROXICET Take by mouth.   oxyCODONE-acetaminophen  5-325 MG tablet Commonly known as: PERCOCET/ROXICET Take 0.5-1 tablets by mouth 2 (two) times daily as needed.   polyethylene glycol powder 17 GM/SCOOP powder Commonly known as: GLYCOLAX/MIRALAX Take 17 g by mouth daily as needed for moderate constipation.   potassium chloride 10 MEQ tablet Commonly known as: KLOR-CON Take 1 tablet (10 mEq total) by mouth daily.   Premarin vaginal cream Generic drug: conjugated estrogens Place 1 Applicatorful vaginally 3 (three) times a week.   sodium chloride 0.65 % Soln nasal spray Commonly known as: OCEAN Place 2 sprays into both nostrils as needed for congestion. Use 1st   tiZANidine 4 MG tablet Commonly known as: ZANAFLEX Take 1 tablet (4 mg total) by mouth 2 (two) times daily as needed for muscle spasms.   traZODone 50 MG tablet Commonly  known as: DESYREL Take 1-2 tablets (50-100 mg total) by mouth at bedtime as needed for sleep. 1 hr before bed   vitamin B-12 1000 MCG tablet Commonly known as: CYANOCOBALAMIN Take 1 tablet (1,000 mcg total) by mouth daily.   Vitamin D3 125 MCG (5000 UT) Tabs Take 1 tablet (5,000 Units total) by mouth daily.       Allergies:  No Known Allergies  Family History: Family History  Problem Relation Age of Onset  . Heart disease Mother   . Hypertension Mother   . Diabetes Mother   . Heart attack Mother 79  . Heart disease Father   . Heart attack Father 48  . Breast cancer Maternal Aunt   . Heart attack Brother     Social History:   reports that she is a non-smoker but has been exposed to tobacco smoke. She has never used smokeless tobacco. She reports that she does not drink alcohol or use drugs.  Physical Exam: BP 124/84   Pulse 97   Ht 5\' 4"  (1.626 m)   Wt 174 lb (78.9 kg)   BMI 29.87 kg/m   Constitutional:  Alert and oriented, no acute distress, nontoxic appearing HEENT: [place], AT Cardiovascular: No clubbing, cyanosis, or edema Respiratory: Normal respiratory effort, no increased work  of breathing Skin: No rashes, bruises or suspicious lesions Neurologic: Grossly intact, no focal deficits, moving all 4 extremities Psychiatric: Normal mood and affect  Laboratory Data: Results for orders placed or performed in visit on 11/30/19  Microscopic Examination   URINE  Result Value Ref Range   WBC, UA 11-30 (A) 0 - 5 /hpf   RBC 0-2 0 - 2 /hpf   Epithelial Cells (non renal) 0-10 0 - 10 /hpf   Renal Epithel, UA 0-10 (A) None seen /hpf   Bacteria, UA Few None seen/Few  Urinalysis, Complete  Result Value Ref Range   Specific Gravity, UA 1.020 1.005 - 1.030   pH, UA 5.5 5.0 - 7.5   Color, UA Yellow Yellow   Appearance Ur Clear Clear   Leukocytes,UA Trace (A) Negative   Protein,UA Negative Negative/Trace   Glucose, UA Negative Negative   Ketones, UA Negative Negative   RBC, UA Negative Negative   Bilirubin, UA Negative Negative   Urobilinogen, Ur 0.2 0.2 - 1.0 mg/dL   Nitrite, UA Negative Negative   Microscopic Examination See below:   BLADDER SCAN AMB NON-IMAGING  Result Value Ref Range   Scan Result 0    Assessment & Plan:   1. Recurrent UTI 74 year old female with a history of recurrent UTI versus asymptomatic bacteriuria presents for follow-up in clinic following an episode of dysuria treated by her PCP with fosfomycin. Urine culture again grew multidrug-resistant E. coli. Patient reports resolution of dysuria following treatment.  I again counseled the patient on the difference between urinary tract infections versus asymptomatic bacteriuria and explained that her overlying OAB symptoms are mimicking infective symptoms. Counseled her to start an OTC nonhormonal vaginal moisturizer to soothe vulvar burning. We will adjust her OAB regimen to manage her other urinary symptoms, see #2 below. No further treatment indicated today. - Urinalysis, Complete  2. Urinary urgency Patient reports continued urinary urgency on Myrbetriq 25 mg daily. PVR 0 mL today. Will increase  dose to 50 mg daily with symptom recheck and PVR in 1 month. - mirabegron ER (MYRBETRIQ) 50 MG TB24 tablet; Take 1 tablet (50 mg total) by mouth daily.  Dispense: 30 tablet; Refill: 11 - BLADDER SCAN  AMB NON-IMAGING  Return in about 4 weeks (around 12/28/2019) for Symptom recheck and PVR.  Carman Ching, PA-C  [facility] [address] [place], [place] [address] [phone]

## 2019-11-30 NOTE — Patient Instructions (Signed)
Start using an over-the-counter vaginal moisturizer to help with your burning. I recommend the brand Replens. It's available in the feminine care aisle of the pharmacy.  I've increased your dose of Myrbetriq and would like to see you back in clinic in one month for a repeat bladder scan and to see how your symptoms are doing.

## 2019-12-02 ENCOUNTER — Other Ambulatory Visit: Payer: Self-pay | Admitting: Internal Medicine

## 2019-12-02 DIAGNOSIS — E039 Hypothyroidism, unspecified: Secondary | ICD-10-CM

## 2019-12-02 MED ORDER — LEVOTHYROXINE SODIUM 88 MCG PO TABS: 88.0000 ug | ORAL_TABLET | Freq: Every day | ORAL | 3 refills | Status: DC

## 2019-12-07 ENCOUNTER — Ambulatory Visit (HOSPITAL_BASED_OUTPATIENT_CLINIC_OR_DEPARTMENT_OTHER): Payer: Medicare PPO | Admitting: Student in an Organized Health Care Education/Training Program

## 2019-12-07 ENCOUNTER — Ambulatory Visit
Admission: RE | Admit: 2019-12-07 | Discharge: 2019-12-07 | Disposition: A | Discharge status: Home / Self Care | Payer: Medicare PPO | Source: Ambulatory Visit | Attending: Student in an Organized Health Care Education/Training Program | Admitting: Student in an Organized Health Care Education/Training Program

## 2019-12-07 ENCOUNTER — Other Ambulatory Visit: Payer: Self-pay

## 2019-12-07 ENCOUNTER — Encounter: Payer: Self-pay | Admitting: Student in an Organized Health Care Education/Training Program

## 2019-12-07 VITALS — BP 119/74 | HR 77 | Temp 98.2°F | Resp 18 | Ht 63.0 in | Wt 167.0 lb

## 2019-12-07 DIAGNOSIS — G894 Chronic pain syndrome: Secondary | ICD-10-CM

## 2019-12-07 DIAGNOSIS — M5416 Radiculopathy, lumbar region: Secondary | ICD-10-CM

## 2019-12-07 DIAGNOSIS — M5136 Other intervertebral disc degeneration, lumbar region: Secondary | ICD-10-CM

## 2019-12-07 DIAGNOSIS — M47816 Spondylosis without myelopathy or radiculopathy, lumbar region: Secondary | ICD-10-CM | POA: Insufficient documentation

## 2019-12-07 DIAGNOSIS — M48062 Spinal stenosis, lumbar region with neurogenic claudication: Secondary | ICD-10-CM | POA: Diagnosis not present

## 2019-12-07 DIAGNOSIS — G8929 Other chronic pain: Secondary | ICD-10-CM

## 2019-12-07 NOTE — Progress Notes (Signed)
Safety precautions to be maintained throughout the outpatient stay will include: orient to surroundings, keep bed in low position, maintain call bell within reach at all times, provide assistance with transfer out of bed and ambulation.  

## 2019-12-07 NOTE — Progress Notes (Signed)
Patient: Kimberly Cook  Service Category: E/M  Provider: Gillis Santa, MD  DOB: February 01, 1946  DOS: 12/07/2019  Referring Provider: Gillis Santa, MD  MRN: [id number]  Setting: Ambulatory outpatient  PCP: McLean-Scocuzza, Nino Glow, MD  Type: New Patient  Specialty: Interventional Pain Management    Location: Office  Delivery: Face-to-face     Primary Reason(s) for Visit: Encounter for initial evaluation of one or more chronic problems (new to examiner) potentially causing chronic pain, and posing a threat to normal musculoskeletal function. (Level of risk: High) CC: Back Pain (lower)  HPI  Kimberly Cook is a 74 y.o. year old, female patient, who comes today to see [place] for the first time for an initial evaluation of her chronic pain. She has Mixed hyperlipidemia; GERD (gastroesophageal reflux disease); Vitamin D deficiency; Depression; Hypothyroidism; Morbid obesity ([place]); Medication management; S/P TKR (total knee replacement); Essential hypertension; Labile blood pressure; Syncope, near; Palpitations; Orthostatic lightheadedness; Shortness of breath; Coronary artery disease of native artery of native heart with stable angina pectoris ([place]); Lung nodule; Anxiety and depression; Insomnia; Syncope; Bilateral occipital neuralgia; Labile hypertension; Hernia, paraesophageal; Kidney cysts; CKD (chronic kidney disease) stage 3, GFR 30-59 ml/min; Carotid artery stenosis; Chronic pain; Osteopenia; DDD (degenerative disc disease), cervical; PAC (premature atrial contraction); PVC's (premature ventricular contractions); Lightheadedness; Toe fracture, left; Chronic midline low back pain; Chronic pain of left knee; Osteoarthritis; Overactive bladder; Abnormal MRI, lumbar spine; Abnormal gait; Chronic back pain; Spinal stenosis of cervical region; Lumbar spondylosis; Recurrent UTI; Lumbar herniated disc; Lumbar degenerative disc disease; Osteoporosis; Lumbar radiculopathy; Lumbar facet arthropathy; and Spinal stenosis,  lumbar region, with neurogenic claudication on their problem list. Today she comes in for evaluation of her Back Pain (lower)  Pain Assessment: Location: Right, Left Back Radiating: both hips and both legs to the feet Onset: More than a month ago Duration: Chronic pain Quality: (agonizing) Severity: 10-Worst pain ever/10 (subjective, self-reported pain score)  Note: Reported level is compatible with observation.  Effect on ADL: difficulty performing daily acitivities Timing: Constant Modifying factors: rest, heat BP: 119/74  HR: 77  Cause of pain: Unknown Severity: Getting worse, NAS-11 at its worse: 10/10, NAS-11 at its best: 6/10, NAS-11 now: 10/10 and NAS-11 on the average: 10/10 Timing: Not influenced by the time of the day Aggravating Factors: Bending, Kneeling, Lifiting, Motion, Prolonged standing and Walking Alleviating Factors: Hot packs, Lying down and Resting Associated Problems: Pain that wakes patient up and Pain that does not allow patient to sleep Quality of Pain: Agonizing, Constant, Disabling, Distressing, Dreadful, Exhausting, Sharp, Shooting and Stabbing Previous Examinations or Tests: Bone scan, CT scan and MRI scan  The patient comes into the clinics today for the first time for a chronic pain management evaluation.   Patient is a pleasant 74 year old female who presents with a chief complaint of severe and persistent axial low back pain with extension into left lateral buttock and left lateral leg.  She states that she has been having low back pain since her 25s.  This increased approximately 1-1/2-year ago.  She ambulates with a walker.  She states that laying down does help alleviate her pain.  She has work with home health physical therapy in the past which she states was not beneficial and actually increased her pain.  She sees physical medicine and rehab for medication management and is currently on Percocet 5 mg 1 tablet twice daily as needed.  She resides at  [facility] and has been utilizing a walker for ambulation since April 2020.  Patient also has severe scoliosis.  She was evaluated by Dr. Cari Caraway on 11/11/2019 and their discussion was regarding spinal cord stimulator trial versus extensive thoracolumbar reconstruction with multilevel fusion.  She is being referred here for consideration of spinal cord stimulator trial.  Of note she also has been receiving treatment for osteoporosis.   Meds   Current Outpatient Medications:  .  alendronate (FOSAMAX) 70 MG tablet, Take 70 mg by mouth once a week., Disp: , Rfl:  .  aspirin EC 81 MG tablet, Take 1 tablet (81 mg total) by mouth daily., Disp: 90 tablet, Rfl: 3 .  atorvastatin (LIPITOR) 20 MG tablet, Take 1 tablet (20 mg total) by mouth daily., Disp: 90 tablet, Rfl: 3 .  Biotin 1 MG CAPS, Take 1 mg by mouth daily. , Disp: , Rfl:  .  CALCIUM PO, Take by mouth daily., Disp: , Rfl:  .  cetirizine (ZYRTEC) 10 MG tablet, Take 5-10 mg by mouth at bedtime as needed., Disp: , Rfl:  .  Cholecalciferol (VITAMIN D3) 125 MCG (5000 UT) TABS, Take 1 tablet (5,000 Units total) by mouth daily., Disp: 90 tablet, Rfl: 3 .  conjugated estrogens (PREMARIN) vaginal cream, Place 1 Applicatorful vaginally 3 (three) times a week., Disp: 30 g, Rfl: 0 .  diltiazem (CARDIZEM SR) 60 MG 12 hr capsule, Take 1 capsule (60 mg total) by mouth 2 (two) times daily., Disp: 180 capsule, Rfl: 3 .  escitalopram (LEXAPRO) 5 MG tablet, Take 1 tablet (5 mg total) by mouth daily., Disp: 90 tablet, Rfl: 3 .  fluticasone (FLONASE) 50 MCG/ACT nasal spray, Place 2 sprays into both nostrils daily as needed for allergies or rhinitis. Use after nasal saline, Disp: 16 g, Rfl: 12 .  folic acid (FOLVITE) 239 MCG tablet, Take 1 tablet (400 mcg total) by mouth daily. SEPARATE ALL SUPPLEMENTS TO LUNCH OR DINNER AND PRILOSEC NOT TO MESS W/THYROID MED, Disp: 90 tablet, Rfl: 3 .  gabapentin (NEURONTIN) 300 MG capsule, 1 pill in the am and lunch and 2 pills  qhs, Disp: , Rfl:  .  hydrALAZINE (APRESOLINE) 10 MG tablet, Take 2 tablets (20 mg total) by mouth 2 (two) times daily as needed. BP>140/>90, Disp: 180 tablet, Rfl: 3 .  hydrochlorothiazide (MICROZIDE) 12.5 MG capsule, Take 1 capsule (12.5 mg total) by mouth daily. In am, Disp: 90 capsule, Rfl: 3 .  levothyroxine (SYNTHROID) 88 MCG tablet, Take 1 tablet (88 mcg total) by mouth daily before breakfast. Skip sundays, Disp: 90 tablet, Rfl: 3 .  LORazepam (ATIVAN) 0.5 MG tablet, Take 1 tablet (0.5 mg total) by mouth 2 (two) times daily as needed for anxiety or sleep. Or 1 mg at night for sleep, Disp: 60 tablet, Rfl: 5 .  LOSARTAN POTASSIUM PO, Take by mouth as needed., Disp: , Rfl:  .  mirabegron ER (MYRBETRIQ) 50 MG TB24 tablet, Take 1 tablet (50 mg total) by mouth daily., Disp: 30 tablet, Rfl: 11 .  montelukast (SINGULAIR) 10 MG tablet, Take 1 tablet (10 mg total) by mouth at bedtime., Disp: 90 tablet, Rfl: 3 .  mupirocin ointment (BACTROBAN) 2 %, Apply 1 application topically 2 (two) times daily. Right lower leg, Disp: 30 g, Rfl: 0 .  naloxegol oxalate (MOVANTIK) 12.5 MG TABS tablet, Take 1-2 tablets (12.5-25 mg total) by mouth daily. Constipation x 3 days prn, Disp: 6 tablet, Rfl: 0 .  omeprazole (PRILOSEC) 40 MG capsule, Take 1 capsule (40 mg total) by mouth daily., Disp: 90 capsule, Rfl: 3 .  oxyCODONE-acetaminophen (PERCOCET/ROXICET) 5-325 MG tablet, Take 0.5-1 tablets by mouth 2 (two) times daily as needed. , Disp: , Rfl:  .  polyethylene glycol powder (GLYCOLAX/MIRALAX) 17 GM/SCOOP powder, Take 17 g by mouth daily as needed for moderate constipation., Disp: 255 g, Rfl: 11 .  potassium chloride (KLOR-CON) 10 MEQ tablet, Take 1 tablet (10 mEq total) by mouth daily., Disp: 90 tablet, Rfl: 3 .  sodium chloride (OCEAN) 0.65 % SOLN nasal spray, Place 2 sprays into both nostrils as needed for congestion. Use 1st, Disp: 1 Bottle, Rfl: 12 .  tiZANidine (ZANAFLEX) 4 MG tablet, Take 1 tablet (4 mg total) by  mouth 2 (two) times daily as needed for muscle spasms., Disp: 60 tablet, Rfl: 5 .  traZODone (DESYREL) 50 MG tablet, Take 1-2 tablets (50-100 mg total) by mouth at bedtime as needed for sleep. 1 hr before bed, Disp: 60 tablet, Rfl: 11 .  vitamin B-12 (CYANOCOBALAMIN) 1000 MCG tablet, Take 1 tablet (1,000 mcg total) by mouth daily., Disp: 90 tablet, Rfl: 3 .  cetirizine (ZYRTEC) 5 MG tablet, Take 1-2 tablets (5-10 mg total) by mouth at bedtime as needed for allergies. (Patient not taking: Reported on 12/07/2019), Disp: 180 tablet, Rfl: 3 .  diazepam (VALIUM) 2 MG tablet, TAKE 1-2 TABLETS BY MOUTH 30 MINUTES BEFORE ESI, Disp: , Rfl:  .  diclofenac sodium (VOLTAREN) 1 % GEL, 2 grams qid to upper body and 4 grams qid to lower body prn (Patient not taking: Reported on 12/07/2019), Disp: 100 g, Rfl: 11 .  FLUAD QUADRIVALENT 0.5 ML injection, , Disp: , Rfl:  .  HYDROcodone-acetaminophen (NORCO/VICODIN) 5-325 MG tablet, Take 0.5-1 tablets by mouth daily as needed., Disp: , Rfl:  .  oxyCODONE-acetaminophen (PERCOCET/ROXICET) 5-325 MG tablet, Take by mouth., Disp: , Rfl:   Imaging Review  Cervical Imaging: Cervical MR wo contrast:  Results for orders placed during the hospital encounter of 11/24/07  MR Cervical Spine Wo Contrast   Narrative Clinical Data: Neck and left arm pain   MRI CERVICAL SPINE WITHOUT CONTRAST   Technique:  Multiplanar and multiecho pulse sequences of the cervical spine, to include the craniocervical junction and cervicothoracic junction, were obtained according to standard protocol without intravenous contrast.   Comparison: None   Findings: Normal cervical alignment.  No fracture is identified and the cord has normal signal.  Hemangiomata are noted in the cervical spine especially at C5 vertebral body.   C2-3:  Negative   C3-4:  Disc degeneration with disc bulging and mild uncinate spurring bilaterally.  There is mild left facet arthropathy and left foraminal  encroachment.   C4-5:  Mild disc degeneration and spondylosis.  There is left foraminal narrowing due to uncinate spurring.   C5-6:  Moderate disc degeneration and spondylosis.  Diffuse uncinate spurring is present with moderate foraminal narrowing bilaterally and mild central canal stenosis.  Central canal measures 9.0 mm in diameter.   C6-7:  Mild disc degeneration   C7-T1:  Negative   IMPRESSION: Cervical degenerative changes as above.  There is spinal stenosis and bilateral foraminal stenosis at C5-6 due to spondylosis.   There is mild left foraminal narrowing at C3-4 and C4-5.  Provider: Lynelle Doctor    Thoracic Imaging: Thoracic MR wo contrast:  Results for orders placed during the hospital encounter of 11/03/19  MR THORACIC SPINE WO CONTRAST   Narrative CLINICAL DATA:  Chronic mid back pain and low back pain extending to the legs, left more than right. Scoliosis.  EXAM: MRI  THORACIC SPINE WITHOUT CONTRAST  TECHNIQUE: Multiplanar, multisequence MR imaging of the thoracic spine was performed. No intravenous contrast was administered.  COMPARISON:  Radiographs dated 05/18/2019  FINDINGS: Alignment: Lateral alignment of the thoracic spine is normal. Compound thoracolumbar scoliosis.  Vertebrae: No fracture, evidence of discitis, or bone lesion.  Cord:  Normal. Tip of the conus is at L1.  Paraspinal and other soft tissues: Negative.  Disc levels:  There is diffuse narrowing of the disc spaces throughout the thoracic spine with tiny disc bulges at each level without neural impingement. Degenerative changes of the vertebral endplates are most prominent at T3-4 and T5-6 and T6-7 and T10-11 and T12-L1.  There is no spinal stenosis. No significant foraminal stenosis. No significant facet arthritis in the thoracic spine.  IMPRESSION: 1. Diffuse degenerative disc disease throughout the thoracic spine without neural impingement. 2. Compound thoracolumbar  scoliosis.   Electronically Signed   By: Lorriane Shire M.D.   On: 11/03/2019 09:59     Results for orders placed during the hospital encounter of 11/03/19  CT THORACIC SPINE WO CONTRAST   Narrative CLINICAL DATA:  Severe back pain worsening over the past several years.  EXAM: CT THORACIC AND LUMBAR SPINE WITHOUT CONTRAST  TECHNIQUE: Multidetector CT imaging of the thoracic and lumbar spine was performed without contrast. Multiplanar CT image reconstructions were also generated.  COMPARISON:  MRI thoracic spine 11/03/2019 and MRI lumbar spine 04/29/2019  FINDINGS: CT THORACIC SPINE FINDINGS  Alignment: Normal alignment of the thoracic vertebral bodies.  Vertebrae: Moderate degenerative changes with endplate reactive type changes/discogenic sclerosis but no bone lesions or acute fracture. The facets are normally aligned.  Paraspinal and other soft tissues: No significant paraspinal findings are identified. There is a moderate-sized hiatal hernia and surgical changes noted along the GE junction. The visualized lung bases are grossly clear.  Disc levels: No obvious large thoracic disc protrusions. No significant spinal or foraminal stenosis. Left-sided spurring changes are noted at T5-6 and T6-7.  CT LUMBAR SPINE FINDINGS  Segmentation: There are five lumbar type vertebral bodies. The last full intervertebral disc space is labeled L5-S1.  Alignment: Normal  Vertebrae: No bone lesions or fractures. Mild endplate reactive changes and small Schmorl's nodes.  Paraspinal and other soft tissues: No significant paraspinal or retroperitoneal findings. Aortic and iliac artery calcifications without aneurysm. Left renal cyst noted.  Disc levels: T12-L1: Moderate degenerative disc disease and mild facet disease but no significant spinal or foraminal stenosis.  L1-2: No significant findings.  L2-3: Bulging annulus asymmetric on the right with mild right lateral recess  stenosis. No significant spinal or foraminal stenosis. Moderate facet disease.  L3-4: Disc disease and facet disease with a bulging annulus and osteophytic ridging. No significant spinal or foraminal stenosis.  L4-5: Advanced disc disease and facet disease with mild spinal and moderate bilateral lateral recess stenosis and mild bilateral foraminal stenosis.  L5-S1: Bulging annulus, asymmetric left with mild left foraminal encroachment. No spinal stenosis. Moderate facet disease.  IMPRESSION: CT THORACIC SPINE IMPRESSION  CLINICAL DATA:  Severe back pain worsening over the past several years.  EXAM: CT THORACIC AND LUMBAR SPINE WITHOUT CONTRAST  TECHNIQUE: Multidetector CT imaging of the thoracic and lumbar spine was performed without contrast. Multiplanar CT image reconstructions were also generated.  COMPARISON:  MRI thoracic spine 11/03/2019 and MRI lumbar spine 04/29/2019  FINDINGS: CT THORACIC SPINE FINDINGS  Alignment: Normal alignment of the thoracic vertebral bodies.  Vertebrae: Moderate degenerative changes with endplate reactive type changes/discogenic sclerosis but  no bone lesions or acute fracture. The facets are normally aligned.  Paraspinal and other soft tissues: No significant paraspinal findings are identified. There is a moderate-sized hiatal hernia and surgical changes noted along the GE junction. The visualized lung bases are grossly clear.  Disc levels: No obvious large thoracic disc protrusions. No significant spinal or foraminal stenosis. Left-sided spurring changes are noted at T5-6 and T6-7.  CT LUMBAR SPINE FINDINGS  Segmentation: There are five lumbar type vertebral bodies. The last full intervertebral disc space is labeled L5-S1.  Alignment: Normal  Vertebrae: No bone lesions or fractures. Mild endplate reactive changes and small Schmorl's nodes.  Paraspinal and other soft tissues: No significant paraspinal or retroperitoneal  findings. Aortic and iliac artery calcifications without aneurysm. Left renal cyst noted.  Disc levels: T12-L1: Moderate degenerative disc disease and mild facet disease but no significant spinal or foraminal stenosis.  L1-2: No significant findings.  L2-3: Bulging annulus asymmetric on the right with mild right lateral recess stenosis. No significant spinal or foraminal stenosis. Moderate facet disease.  L3-4: Disc disease and facet disease with a bulging annulus and osteophytic ridging. No significant spinal or foraminal stenosis.  L4-5: Advanced disc disease and facet disease with mild spinal and moderate bilateral lateral recess stenosis and mild bilateral foraminal stenosis.  L5-S1: Bulging annulus, asymmetric left with mild left foraminal encroachment. No spinal stenosis. Moderate facet disease.  IMPRESSION: 1. Normal alignment of the thoracic and lumbar vertebral bodies and no acute bony findings. 2. Degenerative thoracic spondylosis with endplate reactive type changes/discogenic sclerosis but no bone lesions or significant spinal or foraminal stenosis. 3. Degenerative lumbar spondylosis with multilevel disc disease and facet disease. This is most significant at L4-5 with mild spinal and moderate bilateral lateral recess stenosis and mild bilateral foraminal stenosis. 4. Bulging annulus asymmetric on the left at L5-S1 with mild left foraminal encroachment.   Electronically Signed   By: Marijo Sanes M.D.   On: 11/03/2019 10:15     Results for orders placed during the hospital encounter of 06/25/11  DG Thoracic Spine 4V   Narrative *RADIOLOGY REPORT*  Clinical Data: Pain and upper thoracic and lower lumbar spine; finger numbness  THORACIC SPINE - 4+ VIEW  Comparison: None.  Findings: There is mild S-shaped thoracolumbar scoliosis.  The vertebral body heights are maintained at all levels.  There is mild anterior osteophytosis at all thoracic levels.  The  lateral alignment is normal.  The prevertebral soft tissue stripe is normal.  The pedicles and spinous processes are intact at all levels.  Postsurgical change in the epigastric region is incidentally noted.  IMPRESSION: There is mild S-shaped thoracolumbar scoliosis.  If there is clinical concern regarding a herniated thoracic disc, MRI may be of help.  Original Report Authenticated By: Duayne Cal, M.D.    Lumbosacral Imaging: Lumbar MR wo contrast:  Results for orders placed during the hospital encounter of 04/29/19  MR LUMBAR SPINE WO CONTRAST   Narrative CLINICAL DATA:  Initial evaluation for acute back pain, unable to walk.  EXAM: MRI LUMBAR SPINE WITHOUT CONTRAST  TECHNIQUE: Multiplanar, multisequence MR imaging of the lumbar spine was performed. No intravenous contrast was administered.  COMPARISON:  Prior MRI from 01/15/2019.  FINDINGS: Segmentation: Standard. Lowest well-formed disc labeled the L5-S1 level.  Alignment: Moderate levoscoliosis of the thoracolumbar spine, stable. Trace 3 mm retrolisthesis of L4 on L5, with additional trace retrolisthesis of L1 on L2. Overall, appearance is stable from previous.  Vertebrae: Vertebral body height maintained  without evidence for acute or interval fracture. Bone marrow signal intensity within normal limits. Subcentimeter benign hemangioma noted within the T11 vertebral body. No worrisome osseous lesions. Reactive endplate changes noted throughout the lumbar spine, largely due to scoliotic curvature, most notable at T12-L1, similar to previous. No other abnormal marrow edema.  Conus medullaris and cauda equina: Conus extends to the L1 level. Conus and cauda equina appear normal.  Paraspinal and other soft tissues: Paraspinous soft tissues demonstrate no acute finding. Simple bilateral renal cysts noted, stable. Visualized visceral structures otherwise unremarkable.  Disc levels:  T12-L1: Diffuse disc  bulge with disc desiccation and intervertebral disc space narrowing. Predominant left-sided reactive endplate changes with marginal endplate osteophytic spurring. Mild bilateral facet hypertrophy. Resultant mild narrowing of the left lateral recess. No significant spinal stenosis. Mild left foraminal narrowing. Appearance is unchanged.  L1-2: Diffuse disc bulge with disc desiccation and intervertebral disc space narrowing. Right greater than left reactive endplate changes with marginal endplate osteophytic spurring. Disc bulging and endplate changes asymmetric to the right. Superimposed small right subarticular disc extrusion with inferior migration (series 10, image 16). Superimposed mild facet hypertrophy. Resultant moderate right lateral recess stenosis without significant narrowing of the central canal. Mild right L1 foraminal narrowing. Overall, appearance is stable.  L2-3: Mild diffuse disc bulge with disc desiccation and intervertebral disc space narrowing. Disc bulging asymmetric to the right with right-sided reactive endplate changes. Moderate facet and ligament flavum hypertrophy. Resultant mild right lateral recess stenosis, stable. Moderate right L2 foraminal narrowing, also unchanged.  L3-4: Diffuse disc bulge with disc desiccation and intervertebral disc space narrowing. Right greater than left reactive endplate changes with marginal endplate spurring. Moderate right worse than left facet degeneration. Resultant moderate right lateral recess stenosis without significant narrowing of the central canal. Moderate right L3 foraminal narrowing. Appearance is stable.  L4-5: Chronic intervertebral disc space narrowing with diffuse disc bulge and disc desiccation. Disc bulging asymmetric to the left with left-sided reactive endplate changes. Moderate facet and ligament flavum hypertrophy. Resultant mild canal with moderate left lateral recess stenosis, stable. Moderate left  greater than right L4 foraminal narrowing, also unchanged.  L5-S1: Mild disc bulge, asymmetric to the left. Bulging disc contacts the exiting left L5 nerve root in the left neural foramen, stable (series 6, image 13). Moderate facet degeneration with associated trace reactive joint effusions. No significant canal or lateral recess stenosis. Mild left greater than right L5 foraminal narrowing.  IMPRESSION: 1. Overall, no significant interval change in appearance of the lumbar spine as compared to recent MRI from 01/15/2019. No new or progressive findings. 2. Moderate levoscoliosis with multilevel lumbar spondylosis and facet degeneration. Associated mild to moderate bilateral lateral recess and foraminal narrowing at L1-2 through L4-5 as above.   Electronically Signed   By: Jeannine Boga M.D.   On: 04/29/2019 20:14     Results for orders placed during the hospital encounter of 11/03/19  CT LUMBAR SPINE WO CONTRAST   Narrative CLINICAL DATA:  Severe back pain worsening over the past several years.  EXAM: CT THORACIC AND LUMBAR SPINE WITHOUT CONTRAST  TECHNIQUE: Multidetector CT imaging of the thoracic and lumbar spine was performed without contrast. Multiplanar CT image reconstructions were also generated.  COMPARISON:  MRI thoracic spine 11/03/2019 and MRI lumbar spine 04/29/2019  FINDINGS: CT THORACIC SPINE FINDINGS  Alignment: Normal alignment of the thoracic vertebral bodies.  Vertebrae: Moderate degenerative changes with endplate reactive type changes/discogenic sclerosis but no bone lesions or acute fracture. The facets are  normally aligned.  Paraspinal and other soft tissues: No significant paraspinal findings are identified. There is a moderate-sized hiatal hernia and surgical changes noted along the GE junction. The visualized lung bases are grossly clear.  Disc levels: No obvious large thoracic disc protrusions. No significant spinal or foraminal  stenosis. Left-sided spurring changes are noted at T5-6 and T6-7.  CT LUMBAR SPINE FINDINGS  Segmentation: There are five lumbar type vertebral bodies. The last full intervertebral disc space is labeled L5-S1.  Alignment: Normal  Vertebrae: No bone lesions or fractures. Mild endplate reactive changes and small Schmorl's nodes.  Paraspinal and other soft tissues: No significant paraspinal or retroperitoneal findings. Aortic and iliac artery calcifications without aneurysm. Left renal cyst noted.  Disc levels: T12-L1: Moderate degenerative disc disease and mild facet disease but no significant spinal or foraminal stenosis.  L1-2: No significant findings.  L2-3: Bulging annulus asymmetric on the right with mild right lateral recess stenosis. No significant spinal or foraminal stenosis. Moderate facet disease.  L3-4: Disc disease and facet disease with a bulging annulus and osteophytic ridging. No significant spinal or foraminal stenosis.  L4-5: Advanced disc disease and facet disease with mild spinal and moderate bilateral lateral recess stenosis and mild bilateral foraminal stenosis.  L5-S1: Bulging annulus, asymmetric left with mild left foraminal encroachment. No spinal stenosis. Moderate facet disease.  IMPRESSION: CT THORACIC SPINE IMPRESSION  CLINICAL DATA:  Severe back pain worsening over the past several years.  EXAM: CT THORACIC AND LUMBAR SPINE WITHOUT CONTRAST  TECHNIQUE: Multidetector CT imaging of the thoracic and lumbar spine was performed without contrast. Multiplanar CT image reconstructions were also generated.  COMPARISON:  MRI thoracic spine 11/03/2019 and MRI lumbar spine 04/29/2019  FINDINGS: CT THORACIC SPINE FINDINGS  Alignment: Normal alignment of the thoracic vertebral bodies.  Vertebrae: Moderate degenerative changes with endplate reactive type changes/discogenic sclerosis but no bone lesions or acute fracture. The facets are normally  aligned.  Paraspinal and other soft tissues: No significant paraspinal findings are identified. There is a moderate-sized hiatal hernia and surgical changes noted along the GE junction. The visualized lung bases are grossly clear.  Disc levels: No obvious large thoracic disc protrusions. No significant spinal or foraminal stenosis. Left-sided spurring changes are noted at T5-6 and T6-7.  CT LUMBAR SPINE FINDINGS  Segmentation: There are five lumbar type vertebral bodies. The last full intervertebral disc space is labeled L5-S1.  Alignment: Normal  Vertebrae: No bone lesions or fractures. Mild endplate reactive changes and small Schmorl's nodes.  Paraspinal and other soft tissues: No significant paraspinal or retroperitoneal findings. Aortic and iliac artery calcifications without aneurysm. Left renal cyst noted.  Disc levels: T12-L1: Moderate degenerative disc disease and mild facet disease but no significant spinal or foraminal stenosis.  L1-2: No significant findings.  L2-3: Bulging annulus asymmetric on the right with mild right lateral recess stenosis. No significant spinal or foraminal stenosis. Moderate facet disease.  L3-4: Disc disease and facet disease with a bulging annulus and osteophytic ridging. No significant spinal or foraminal stenosis.  L4-5: Advanced disc disease and facet disease with mild spinal and moderate bilateral lateral recess stenosis and mild bilateral foraminal stenosis.  L5-S1: Bulging annulus, asymmetric left with mild left foraminal encroachment. No spinal stenosis. Moderate facet disease.  IMPRESSION: 1. Normal alignment of the thoracic and lumbar vertebral bodies and no acute bony findings. 2. Degenerative thoracic spondylosis with endplate reactive type changes/discogenic sclerosis but no bone lesions or significant spinal or foraminal stenosis. 3. Degenerative lumbar spondylosis with  multilevel disc disease and facet disease. This  is most significant at L4-5 with mild spinal and moderate bilateral lateral recess stenosis and mild bilateral foraminal stenosis. 4. Bulging annulus asymmetric on the left at L5-S1 with mild left foraminal encroachment.   Electronically Signed   By: Marijo Sanes M.D.   On: 11/03/2019 10:15     Lumbar DG (Complete) 4+V:  Results for orders placed during the hospital encounter of 06/25/11  DG Lumbar Spine Complete   Narrative *RADIOLOGY REPORT*  Clinical Data: Backache.  LUMBAR SPINE - COMPLETE 4+ VIEW  Comparison: 12/27/2009  Findings: Atherosclerotic calcification of the aorta and splenic artery noted.  The levoconvex lumbar scoliosis has significant rotary component. There is loss of intervertebral disc height at the L3-4 and L4-5 levels.  Facet arthropathy is present bilaterally at L3-4, L4-5, and L5-S1.  No lumbar spine fracture is observed.  No pars defects identified.  IMPRESSION:  1.  Considerable lumbar spondylosis and scoliosis, with loss of intervertebral disc height at the L3-4 and L4-5 levels. 2.  No compelling findings of fracture or significant subluxation.  Original Report Authenticated By: Carron Curie, M.D.          Results for orders placed during the hospital encounter of 12/18/07  DG Epidurography   Narrative Clinical Data:  Moderate improvement from the initial injection. Persistent symptoms particularly on the left   Fluoroscopy Time: 35 seconds   CERVICAL EPIDURAL INJECTION   The overlying skin was scrubbed with Betadine and draped in sterile fashion.  Skin anesthesia was carried out using 1% Lidocaine. An interlaminar approach was performed on the left at C7-T1. A 20 gauge Crawford epidural needle was advanced using loss-of- resistance technique.   DIAGNOSTIC EPIDURAL INJECTION   Injection of Omnipaque 300 shows a good epidural pattern with spread above and below the level of needle placement, primarily on the left. No  vascular opacification is seen.   THERAPEUTIC EPIDURAL INJECTION   1.5 cc of Kenalog 40 mixed with 1 cc of 1% Lidocaine and 2 cc of normal saline were then instilled.  The procedure was well- tolerated, and the patient was discharged thirty minutes following the injection in good condition.   IMPRESSION: Technically successful second epidural injection on the left at C7- T1  Provider: Candis Schatz    Results for orders placed in visit on 04/20/18  DG Knee Complete 4 Views Left   Narrative CLINICAL DATA:  Left knee pain for 1 year following fall, initial encounter  EXAM: LEFT KNEE - COMPLETE 4+ VIEW  COMPARISON:  11/29/2009  FINDINGS: Left knee prosthesis is now noted. No acute fracture or dislocation is noted. No loosening is seen. No soft tissue changes are noted.  IMPRESSION: No acute abnormality noted.   Electronically Signed   By: Inez Catalina M.D.   On: 04/20/2018 16:41     Complexity Note: Imaging results reviewed. Results shared with Ms. Hendler, using Layman's terms.                         ROS  Cardiovascular: Daily Aspirin intake and High blood pressure Pulmonary or Respiratory: No reported pulmonary signs or symptoms such as wheezing and difficulty taking a deep full breath (Asthma), difficulty blowing air out (Emphysema), coughing up mucus (Bronchitis), persistent dry cough, or temporary stoppage of breathing during sleep Neurological: Stroke (Residual deficits or weakness: weakness) Psychological-Psychiatric: Anxiousness and Depressed Gastrointestinal: Reflux or heatburn and No reported gastrointestinal signs or symptoms such  as vomiting or evacuating blood, reflux, heartburn, alternating episodes of diarrhea and constipation, inflamed or scarred liver, or pancreas or irrregular and/or infrequent bowel movements Genitourinary: No reported renal or genitourinary signs or symptoms such as difficulty voiding or producing urine, peeing blood,  non-functioning kidney, kidney stones, difficulty emptying the bladder, difficulty controlling the flow of urine, or chronic kidney disease Hematological: No reported hematological signs or symptoms such as prolonged bleeding, low or poor functioning platelets, bruising or bleeding easily, hereditary bleeding problems, low energy levels due to low hemoglobin or being anemic Endocrine: Slow thyroid Rheumatologic: No reported rheumatological signs and symptoms such as fatigue, joint pain, tenderness, swelling, redness, heat, stiffness, decreased range of motion, with or without associated rash Musculoskeletal: Negative for myasthenia gravis, muscular dystrophy, multiple sclerosis or malignant hyperthermia Work History: Retired  Allergies  Ms. Cantrall has No Known Allergies.  Laboratory Chemistry Profile   Renal Lab Results  Component Value Date   BUN 13 10/19/2019   CREATININE 0.84 10/19/2019   LABCREA 119 12/16/2017   BCR 17 04/11/2017   GFR 56.82 (L) 10/09/2018   GFRAA >60 10/19/2019   GFRNONAA >60 10/19/2019   SPECGRAV 1.020 11/30/2019   PHUR 5.5 11/30/2019   PROTEINUR Negative 11/30/2019     Electrolytes Lab Results  Component Value Date   NA 136 10/19/2019   K 3.3 (L) 10/19/2019   CL 100 10/19/2019   CALCIUM 9.1 10/19/2019   MG 1.9 05/19/2018   PHOS 2.7 04/25/2008     Hepatic Lab Results  Component Value Date   AST 17 10/19/2019   ALT 16 10/19/2019   ALBUMIN 4.0 10/19/2019   ALKPHOS 48 10/19/2019   LIPASE 27 10/19/2019     ID Lab Results  Component Value Date   STAPHAUREUS NEGATIVE 04/06/2015   MRSAPCR NEGATIVE 04/06/2015     Bone Lab Results  Component Value Date   VD25OH 70.87 05/19/2018     Endocrine Lab Results  Component Value Date   GLUCOSE 116 (H) 10/19/2019   GLUCOSEU Negative 11/30/2019   HGBA1C 5.1 11/24/2019   TSH 2.93 11/24/2019   FREET4 1.72 04/25/2008     Neuropathy Lab Results  Component Value Date   VITAMINB12 248 05/19/2018    FOLATE  04/27/2008    8.2 (NOTE)  Reference Ranges        Deficient:       0.4 - 3.3 ng/mL        Indeterminate:   3.4 - 5.4 ng/mL        Normal:              > 5.4 ng/mL   HGBA1C 5.1 11/24/2019     CNS No results found for: COLORCSF, APPEARCSF, RBCCOUNTCSF, WBCCSF, POLYSCSF, LYMPHSCSF, EOSCSF, PROTEINCSF, GLUCCSF, JCVIRUS, CSFOLI, IGGCSF, LABACHR, ACETBL, LABACHR, ACETBL   Inflammation (CRP: Acute  ESR: Chronic) Lab Results  Component Value Date   LATICACIDVEN 1.6 12/26/2009     Rheumatology No results found for: RF, ANA, LABURIC, URICUR, LYMEIGGIGMAB, LYMEABIGMQN, HLAB27   Coagulation Lab Results  Component Value Date   INR 1.06 04/06/2015   LABPROT 14.0 04/06/2015   APTT 28 04/06/2015   PLT 161 10/19/2019     Cardiovascular Lab Results  Component Value Date   CKTOTAL 32 01/07/2017   CKMB 1.8 04/26/2008   TROPONINI <0.03 06/30/2018   HGB 13.2 10/19/2019   HCT 37.4 10/19/2019   EPIRU see note 12/16/2017   EPINEPH24HUR 3 09/29/2017   NOREPRU 13 09/29/2017   NOREPI24HUR 20  09/29/2017   DOPARU 313 12/16/2017   DOPAM24HRUR 215 09/29/2017     Screening Lab Results  Component Value Date   STAPHAUREUS NEGATIVE 04/06/2015   MRSAPCR NEGATIVE 04/06/2015     Cancer No results found for: CEA, CA125, LABCA2   Allergens No results found for: ALMOND, APPLE, ASPARAGUS, AVOCADO, BANANA, BARLEY, BASIL, BAYLEAF, GREENBEAN, LIMABEAN, WHITEBEAN, BEEFIGE, REDBEET, BLUEBERRY, BROCCOLI, CABBAGE, MELON, CARROT, CASEIN, CASHEWNUT, CAULIFLOWER, CELERY     Note: Lab results reviewed.   [place]  Drug: Ms. Quesinberry  reports no history of drug use. Alcohol:  reports no history of alcohol use. Tobacco:  reports that she is a non-smoker but has been exposed to tobacco smoke. She has never used smokeless tobacco. Medical:  has a past medical history of Allergy, Arthritis, Coronary artery disease (04/2017), Depression, Diverticulosis, Essential hypertension, GERD (gastroesophageal reflux  disease), Headache, History of shingles, Hyperlipidemia, Hypothyroidism, Mini stroke ([place]) (2011 ), Occipital neuralgia, Prediabetes, Stroke [facility]), Stroke [facility]), Urinary tract infection, and Vitamin D deficiency. Family: family history includes Breast cancer in her maternal aunt; Diabetes in her mother; Heart attack in her brother; Heart attack (age of onset: 73) in her mother; Heart attack (age of onset: 66) in her father; Heart disease in her father and mother; Hypertension in her mother.  Past Surgical History:  Procedure Laterality Date  . ABDOMINAL HYSTERECTOMY    . BLADDER SURGERY     2003  . BREAST EXCISIONAL BIOPSY Right Over 20 years    Benign  . CHOLECYSTECTOMY    . gastroplication     . JOINT REPLACEMENT Left   . KNEE ARTHROSCOPY Left 2011  . TONSILLECTOMY AND ADENOIDECTOMY    . TOTAL KNEE ARTHROPLASTY Left 04/17/2015   Procedure: LEFT TOTAL KNEE ARTHROPLASTY;  Surgeon: Paralee Cancel, MD;  Location: [facility];  Service: Orthopedics;  Laterality: Left;   Active Ambulatory Problems    Diagnosis Date Noted  . Mixed hyperlipidemia   . GERD (gastroesophageal reflux disease)   . Vitamin D deficiency   . Depression   . Hypothyroidism 02/17/2014  . Morbid obesity ([place]) 09/21/2014  . Medication management 09/21/2014  . S/P TKR (total knee replacement) 04/17/2015  . Essential hypertension   . Labile blood pressure 03/08/2017  . Syncope, near 03/08/2017  . Palpitations 03/08/2017  . Orthostatic lightheadedness 03/08/2017  . Shortness of breath 04/11/2017  . Coronary artery disease of native artery of native heart with stable angina pectoris ([place]) 06/03/2017  . Lung nodule 06/30/2017  . Anxiety and depression 08/27/2017  . Insomnia 08/27/2017  . Syncope 08/27/2017  . Bilateral occipital neuralgia 08/27/2017  . Labile hypertension 09/05/2017  . Hernia, paraesophageal 12/16/2017  . Kidney cysts 12/16/2017  . CKD (chronic kidney disease) stage 3, GFR 30-59 ml/min 12/16/2017  .  Carotid artery stenosis 12/16/2017  . Chronic pain 12/16/2017  . Osteopenia 12/16/2017  . DDD (degenerative disc disease), cervical 12/16/2017  . PAC (premature atrial contraction) 01/19/2018  . PVC's (premature ventricular contractions) 01/19/2018  . Lightheadedness 01/21/2018  . Toe fracture, left 03/10/2018  . Chronic midline low back pain 04/22/2018  . Chronic pain of left knee 04/22/2018  . Osteoarthritis 10/09/2018  . Overactive bladder 10/09/2018  . Abnormal MRI, lumbar spine 02/18/2019  . Abnormal gait 02/18/2019  . Chronic back pain 02/18/2019  . Spinal stenosis of cervical region 05/07/2019  . Lumbar spondylosis 05/07/2019  . Recurrent UTI 05/07/2019  . Lumbar herniated disc 06/09/2019  . Lumbar degenerative disc disease 06/09/2019  . Osteoporosis 11/24/2019  . Lumbar  radiculopathy 12/07/2019  . Lumbar facet arthropathy 12/07/2019  . Spinal stenosis, lumbar region, with neurogenic claudication 12/07/2019   Resolved Ambulatory Problems    Diagnosis Date Noted  . Prediabetes   . Encounter for long-term (current) use of other medications 01/10/2014  . Poor compliance 01/10/2014  . BMI 27.0-27.9,adult 06/17/2015   Past Medical History:  Diagnosis Date  . Allergy   . Arthritis   . Coronary artery disease 04/2017  . Diverticulosis   . Headache   . History of shingles   . Hyperlipidemia   . Mini stroke ([place]) 2011   . Occipital neuralgia   . Stroke ([place])   . Stroke ([place])   . Urinary tract infection    Constitutional Exam  General appearance: alert, cooperative and in moderate distress Vitals:   12/07/19 0923  BP: 119/74  Pulse: 77  Resp: 18  Temp: 98.2 F (36.8 C)  TempSrc: Oral  SpO2: 98%  Weight: 167 lb (75.8 kg)  Height: 5' 3"  (1.6 m)   BMI Assessment: Estimated body mass index is 29.58 kg/m as calculated from the following:   Height as of this encounter: 5' 3"  (1.6 m).   Weight as of this encounter: 167 lb (75.8 kg).  BMI interpretation  table: BMI level Category Range association with higher incidence of chronic pain  <18 kg/m2 Underweight   18.5-24.9 kg/m2 Ideal body weight   25-29.9 kg/m2 Overweight Increased incidence by 20%  30-34.9 kg/m2 Obese (Class I) Increased incidence by 68%  35-39.9 kg/m2 Severe obesity (Class II) Increased incidence by 136%  >40 kg/m2 Extreme obesity (Class III) Increased incidence by 254%   Patient's current BMI Ideal Body weight  Body mass index is 29.58 kg/m. Ideal body weight: 52.4 kg (115 lb 8.3 oz) Adjusted ideal body weight: 61.7 kg (136 lb 1.8 oz)   BMI Readings from Last 4 Encounters:  12/07/19 29.58 kg/m  11/30/19 29.87 kg/m  11/24/19 30.86 kg/m  10/19/19 29.21 kg/m   Wt Readings from Last 4 Encounters:  12/07/19 167 lb (75.8 kg)  11/30/19 174 lb (78.9 kg)  11/24/19 174 lb 3.2 oz (79 kg)  10/19/19 164 lb 14.5 oz (74.8 kg)    Psych/Mental status: Alert, oriented x 3 (person, place, & time)       Eyes: PERLA Respiratory: No evidence of acute respiratory distress  Cervical Spine Exam  Skin & Axial Inspection: No masses, redness, edema, swelling, or associated skin lesions Alignment: Symmetrical Functional ROM: Unrestricted ROM      Stability: No instability detected Muscle Tone/Strength: Functionally intact. No obvious neuro-muscular anomalies detected. Sensory (Neurological): Unimpaired Palpation: No palpable anomalies              Upper Extremity (UE) Exam    Side: Right upper extremity  Side: Left upper extremity  Skin & Extremity Inspection: Skin color, temperature, and hair growth are WNL. No peripheral edema or cyanosis. No masses, redness, swelling, asymmetry, or associated skin lesions. No contractures.  Skin & Extremity Inspection: Skin color, temperature, and hair growth are WNL. No peripheral edema or cyanosis. No masses, redness, swelling, asymmetry, or associated skin lesions. No contractures.  Functional ROM: Unrestricted ROM          Functional ROM:  Unrestricted ROM          Muscle Tone/Strength: Functionally intact. No obvious neuro-muscular anomalies detected.  Muscle Tone/Strength: Functionally intact. No obvious neuro-muscular anomalies detected.  Sensory (Neurological): Unimpaired          Sensory (Neurological): Unimpaired  Palpation: No palpable anomalies              Palpation: No palpable anomalies              Provocative Test(s):  Phalen's test: deferred Tinel's test: deferred Apley's scratch test (touch opposite shoulder):  Action 1 (Across chest): deferred Action 2 (Overhead): deferred Action 3 (LB reach): deferred   Provocative Test(s):  Phalen's test: deferred Tinel's test: deferred Apley's scratch test (touch opposite shoulder):  Action 1 (Across chest): deferred Action 2 (Overhead): deferred Action 3 (LB reach): deferred    Thoracic Spine Area Exam  Skin & Axial Inspection: No masses, redness, or swelling Alignment: Symmetrical Functional ROM: Decreased ROM Stability: No instability detected Muscle Tone/Strength: Functionally intact. No obvious neuro-muscular anomalies detected. Sensory (Neurological): Dermatomal pain pattern Muscle strength & Tone: No palpable anomalies  Lumbar Exam  Skin & Axial Inspection: Lumbar Scoliosis Alignment: Levoscoliosis Functional ROM: Decreased ROM       Stability: No instability detected Muscle Tone/Strength: Functionally intact. No obvious neuro-muscular anomalies detected. Sensory (Neurological): Dermatomal pain pattern and musculoskeletal Palpation: No palpable anomalies       Provocative Tests: Hyperextension/rotation test: (+) bilaterally for facet joint pain. Lumbar quadrant test (Kemp's test): (+) bilateral for foraminal stenosis Lateral bending test: (+) ipsilateral radicular pain, bilaterally. Positive for bilateral foraminal stenosis. Patrick's Maneuver: (+) for bilateral S-I arthralgia              Gait & Posture Assessment  Ambulation: Patient  ambulates using a walker Gait: Significantly limited. Dependent on assistive device to ambulate Posture: Difficulty standing up straight, due to pain   Lower Extremity Exam    Side: Right lower extremity  Side: Left lower extremity  Stability: No instability observed          Stability: No instability observed          Skin & Extremity Inspection: Skin color, temperature, and hair growth are WNL. No peripheral edema or cyanosis. No masses, redness, swelling, asymmetry, or associated skin lesions. No contractures.  Skin & Extremity Inspection: Skin color, temperature, and hair growth are WNL. No peripheral edema or cyanosis. No masses, redness, swelling, asymmetry, or associated skin lesions. No contractures.  Functional ROM: Decreased ROM for hip and knee joints          Functional ROM: Decreased ROM for hip and knee joints          Muscle Tone/Strength: Functionally intact. No obvious neuro-muscular anomalies detected.  Muscle Tone/Strength: Functionally intact. No obvious neuro-muscular anomalies detected.  Sensory (Neurological): Dermatomal pain pattern        Sensory (Neurological): Dermatomal pain pattern        DTR: Patellar: 0: absent Achilles: deferred today Plantar: deferred today  DTR: Patellar: 0: absent Achilles: deferred today Plantar: deferred today  Palpation: No palpable anomalies  Palpation: No palpable anomalies   Assessment  Primary Diagnosis & Pertinent Problem List: The primary encounter diagnosis was Chronic radicular lumbar pain. Diagnoses of Lumbar radiculopathy, Lumbar facet arthropathy, Lumbar spondylosis, Spinal stenosis, lumbar region, with neurogenic claudication, Lumbar degenerative disc disease, and Chronic pain syndrome were also pertinent to this visit.  Visit Diagnosis (New problems to examiner): 1. Chronic radicular lumbar pain   2. Lumbar radiculopathy   3. Lumbar facet arthropathy   4. Lumbar spondylosis   5. Spinal stenosis, lumbar region, with  neurogenic claudication   6. Lumbar degenerative disc disease   7. Chronic pain syndrome    Plan of Care (Initial workup  plan)      Imaging Orders     DG PAIN CLINIC C-ARM 1-60 MIN NO REPORT  I had extensive discussion with the patient regarding spinal cord stimulator trial and what it entails.  Patient has severe facet arthrosis, heterotopic bone formation as well as severe scoliosis.  I evaluated the patient's interlaminar windows under fluoroscopy and unfortunately they are not patent enough to tolerate a percutaneous trial.  This is further complicated by her levoscoliosis which makes it even more challenging to obtain percutaneous access at the T12-L1 or L1-L2 interspace which has severely collapsed disks and significant facet and laminar arthrosis.  For this reason, I do not recommend percutaneous spinal cord stimulator trial.  Could consider paddle trial with Dr. Lacinda Axon or Dr. Cari Caraway.  Patient states that she will reach back out to Dr. Cari Caraway to discuss further.  I also offered the patient a referral to [facility] with Dr. Mearl Latin as he also performs paddle trial but she states that she will follow back up with Dr. Cari Caraway for the time being.  Provider-requested follow-up: No follow-ups on file.  Future Appointments  Date Time Provider [place]  12/28/2019 11:00 AM Vaillancourt, [place], PA-C [facility] None  07/24/2020  9:00 AM Dia Crawford, LPN [id number]  11/08/477 11:15 AM Hollice Espy, MD [facility] None    Note by: Gillis Santa, MD Date: 12/07/2019; Time: 12:39 PM

## 2019-12-21 DIAGNOSIS — G894 Chronic pain syndrome: Secondary | ICD-10-CM | POA: Diagnosis not present

## 2019-12-28 ENCOUNTER — Ambulatory Visit: Payer: Self-pay | Admitting: Physician Assistant

## 2020-01-10 DIAGNOSIS — G894 Chronic pain syndrome: Secondary | ICD-10-CM | POA: Diagnosis not present

## 2020-01-13 ENCOUNTER — Other Ambulatory Visit: Payer: Self-pay | Admitting: Neurosurgery

## 2020-01-14 ENCOUNTER — Other Ambulatory Visit: Payer: Self-pay | Admitting: Neurosurgery

## 2020-01-20 ENCOUNTER — Encounter
Admission: RE | Admit: 2020-01-20 | Discharge: 2020-01-20 | Disposition: A | Discharge status: Home / Self Care | Payer: Medicare HMO | Source: Ambulatory Visit | Attending: Neurosurgery | Admitting: Neurosurgery

## 2020-01-20 ENCOUNTER — Other Ambulatory Visit: Payer: Self-pay

## 2020-01-20 DIAGNOSIS — Z20822 Contact with and (suspected) exposure to covid-19: Secondary | ICD-10-CM | POA: Diagnosis not present

## 2020-01-20 DIAGNOSIS — Z01818 Encounter for other preprocedural examination: Secondary | ICD-10-CM | POA: Insufficient documentation

## 2020-01-20 HISTORY — DX: Chronic kidney disease, unspecified: N18.9

## 2020-01-20 HISTORY — DX: Solitary pulmonary nodule: R91.1

## 2020-01-20 HISTORY — DX: Anxiety disorder, unspecified: F41.9

## 2020-01-20 HISTORY — DX: Other intervertebral disc degeneration, lumbar region: M51.36

## 2020-01-20 HISTORY — DX: Other intervertebral disc degeneration, lumbar region without mention of lumbar back pain or lower extremity pain: M51.369

## 2020-01-20 HISTORY — DX: Dorsalgia, unspecified: M54.9

## 2020-01-20 HISTORY — DX: Palpitations: R00.2

## 2020-01-20 NOTE — Patient Instructions (Addendum)
Your procedure is scheduled on: 01-24-20 MONDAY/01-31-20 MONDAY Report to Same Day Surgery 2nd floor medical mall [facility] Entrance-take elevator on left to 2nd floor.  Check in with surgery information desk.) To find out your arrival time please call [phone] between 1PM - 3PM on 01-21-20 FRIDAY/01-28-20 FRIDAY  Remember: Instructions that are not followed completely may result in serious medical risk, up to and including death, or upon the discretion of your surgeon and anesthesiologist your surgery may need to be rescheduled.    _x___ 1. Do not eat food after midnight the night before your procedure. NO GUM OR CANDY AFTER MIDNIGHT. You may drink clear liquids up to 2 hours before you are scheduled to arrive at the hospital for your procedure.  Do not drink clear liquids within 2 hours of your scheduled arrival to the hospital.  Clear liquids include  --Water or Apple juice without pulp  --Gatorade  --Black Coffee or Clear Tea (No milk, no creamers, do not add anything to the coffee or Tea-ok to add sugar)     __x__ 2. No Alcohol for 24 hours before or after surgery.   __x__3. No Smoking or e-cigarettes for 24 prior to surgery.  Do not use any chewable tobacco products for at least 6 hour prior to surgery   ____  4. Bring all medications with you on the day of surgery if instructed.    __x__ 5. Notify your doctor if there is any change in your medical condition     (cold, fever, infections).    x___6. On the morning of surgery brush your teeth with toothpaste and water.  You may rinse your mouth with mouth wash if you wish.  Do not swallow any toothpaste or mouthwash.   Do not wear jewelry, make-up, hairpins, clips or nail polish.  Do not wear lotions, powders, or perfumes.  Do not shave 48 hours prior to surgery. Men may shave face and neck.  Do not bring valuables to the hospital.    [facility] is not responsible for any belongings or valuables.               Contacts,  dentures or bridgework may not be worn into surgery.  Leave your suitcase in the car. After surgery it may be brought to your room.  For patients admitted to the hospital, discharge time is determined by your treatment team.  _  Patients discharged the day of surgery will not be allowed to drive home.  You will need someone to drive you home and stay with you the night of your procedure.    Please read over the following fact sheets that you were given:   [facility] Preparing for Surgery and or MRSA Information   _x___ TAKE THE FOLLOWING MEDICATION THE MORNING OF SURGERY WITH A SMALL SIP OF WATER. These include:  1. DILTIAZEM (CARDIZEM)  2. BRING YOUR HYDRALAZINE BOTTLE TO THE HOSPITAL THE DAY OF YOUR SURGERY   3.  4.  5.  6.  ____Fleets enema or Magnesium Citrate as directed.   _x___ Use CHG Soap or sage wipes as directed on instruction sheet   ____ Use inhalers on the day of surgery and bring to hospital day of surgery  ____ Stop Metformin and Janumet 2 days prior to surgery.    ____ Take 1/2 of usual insulin dose the night before surgery and none on the morning surgery.   _x___ Follow recommendations from Cardiologist, Pulmonologist or PCP regarding stopping Aspirin,  Coumadin, Plavix ,Eliquis, Effient, or Pradaxa, and Pletal-CALL DR COOK'S OFFICE REGARDING WHEN TO STOP YOUR ASPIRIN  X____Stop Anti-inflammatories such as Advil, Aleve, Ibuprofen, Motrin, Naproxen, Naprosyn, Goodies powders or aspirin products NOW-OK to take Tylenol OR PERCOCET IF NEEDED   _x___ Stop supplements until after surgery-STOP YOUR BIOTIN, TURMERIC AND CRANBERRY NOW-YOU MAY RESUME AFTER YOUR SURGERY   ____ Bring C-Pap to the hospital.

## 2020-01-20 NOTE — Pre-Procedure Instructions (Signed)
Kimberly Kendall, MD  Physician  Cardiology  Progress Notes     Signed  Encounter Date:  01/21/2018          Signed      Expand All Collapse All  Show:Clear all [x] Manual[x] Template[x] Copied  Added by: [x] End, , MD  [] Hover for details   Follow-up Outpatient Visit Date: 01/21/2018  Primary Care Provider: McLean-Scocuzza, Cristal Deer, MD [address] [place] [address]  Chief Complaint: Follow-up labile blood pressure and palpitations  HPI:  Kimberly Cook is a 74 y.o. year-old female with history of hypertension, hyperlipidemia, "mini strokes," GERD, depression, and anxiety, who presents for follow-up of lightheadedness and labile blood pressure.  I last saw her in March, which time she reported that her blood pressure was still up and down.  Prior work-up for secondary hypertension was negative.  Since her last visit, her PCP is also obtained renal artery Dopplers, which did not show evidence of renal artery stenosis.  Today, Kimberly Cook reports that she is feeling better than at our prior visits.  She feels like having moved to [facility] [place] has greatly improved her stress.  She continues to have some stress, noting that her 18-month-old grandchild is undergoing cardiac surgery today for congenital heart defect.  She has not had any significant palpitations dyspnea, or chest pain.  She reports minimal lightheadedness, though her blood pressure has been labile at times.  Overall, it is better than at previous visits.  She notes that her blood pressure and heart rates have been somewhat labile though overall under better control since being switched to diltiazem 60 mg every morning and 30 mg every afternoon.  She has not had any significant bradycardic episodes for several weeks.  Ms. Darcey intermittently sleeps well at night but typically is well rested when she wakes up in the morning.  She does not nap during the day, concerned that she may have trouble  falling asleep if she naps too much.  She has not had any significant orthopnea, PND, or edema.  She notes having undergone a sleep study recently and was told that she does not have sleep apnea.  --------------------------------------------------------------------------------------------------  Cardiovascular History & Procedures: Cardiovascular Problems:  Orthostatic lightheadedness and labile blood pressure  Dyspnea on exertion  Risk Factors:  Hypertension, hyperlipidemia, family history, sedentary lifestyle, and age greater than 82  Cath/PCI:  None  CV Surgery:  None  EP Procedures and Devices:  14-day event monitor (04/24/17): Sinus rhythm without arrhythmias.  48 hour Holter monitor (03/20/17): Predominantly sinus rhythm with occasional PACs and rare PVCs. Brief episode of blocked PACs versus 2:1 AV block.  Non-Invasive Evaluation(s):  Renal artery Doppler (12/26/17): No evidence of renal artery stenosis.  Incidental note made of 2.1 cm left renal cyst.  Carotid Doppler (09/08/17): Minimal plaquing of both carotid arteries. No significant stenosis. Patent vertebral arteries with antegrade flow bilaterally. Normal hemodynamics in both subclavian arteries.  Coronary CTA (04/30/17):Mild to moderate coronary artery disease proximal LAD and ostium of small D2. CT FFR of distal most LAD is 0.79. Coronary calcium score 257. Small left lower lobe nodule (3 mm).  TTE (03/20/17): Normal LV size with mild focal basal hypertrophy of the septum. LVEF 50-55% with normal wall motion. Grade 1 diastolic dysfunction. Mitral annular calcification present. Normal RV size and function. Normal pulmonary artery pressure.  Pharmacologic MPI (12/14/15): Low risk without ischemia or scar. LVEF 68%.  TTE (12/14/15): Normal LV size and wall thickness. LVEF 55-60%.Normal RV size and function. No significant  valvular abnormalities.  Recent CV Pertinent Labs: Labs (Brief)       Lab  Results  Component Value Date   CHOL 119 08/22/2017   HDL 57 08/22/2017   LDLCALC 46 08/22/2017   TRIG 81 08/22/2017   CHOLHDL 2.1 08/22/2017   CHOLHDL 2.0 10/08/2016   INR 1.06 04/06/2015   K 3.7 12/16/2017   MG 2.1 01/07/2017   BUN 17 12/16/2017   BUN 17 04/11/2017   CREATININE 0.91 12/16/2017   CREATININE 1.18 (H) 01/07/2017      Past medical and surgical history were reviewed and updated in EPIC.  Active Medications      Current Meds  Medication Sig  . aspirin EC 81 MG tablet Take 1 tablet (81 mg total) by mouth daily.  Marland Kitchen atorvastatin (LIPITOR) 20 MG tablet Take 1 tablet (20 mg total) by mouth daily.  . Biotin 1 MG CAPS Take 1 mg by mouth daily.   . Cholecalciferol (VITAMIN D3) 5000 units TABS Take 5,000 Units by mouth daily.  Marland Kitchen diltiazem (CARDIZEM SR) 60 MG 12 hr capsule Take 1 capsule (60 mg total) by mouth every morning.  . diltiazem (CARDIZEM) 30 MG tablet Take 1 tablet (30 mg total) by mouth at bedtime.  Marland Kitchen escitalopram (LEXAPRO) 5 MG tablet Take 5 mg by mouth daily.  . folic acid (FOLVITE) 578 MCG tablet Take 400 mcg by mouth daily.  . hydrochlorothiazide (MICROZIDE) 12.5 MG capsule Take 1 capsule (12.5 mg total) by mouth daily. In am  . levothyroxine (SYNTHROID, LEVOTHROID) 100 MCG tablet Take 1 tablet (100 mcg total) by mouth daily before breakfast.  . omeprazole (PRILOSEC) 40 MG capsule Take 1 capsule (40 mg total) by mouth daily.  . temazepam (RESTORIL) 30 MG capsule Take 1 capsule (30 mg total) by mouth at bedtime as needed for sleep.  Marland Kitchen tiZANidine (ZANAFLEX) 4 MG tablet Take 1 tablet (4 mg total) by mouth 2 (two) times daily as needed for muscle spasms.      Allergies: Patient has no known allergies.  Social History       Tobacco Use  . Smoking status: Passive Smoke Exposure - Never Smoker  . Smokeless tobacco: Never Used  . Tobacco comment: husbands and children smoked in home.   Substance Use Topics  . Alcohol use: No  . Drug  use: No         Family History  Problem Relation Age of Onset  . Heart disease Mother   . Hypertension Mother   . Diabetes Mother   . Heart attack Mother 47  . Heart disease Father   . Heart attack Father 53  . Breast cancer Maternal Aunt   . Heart attack Brother     Review of Systems: A 12-system review of systems was performed and was negative except as noted in the HPI.  --------------------------------------------------------------------------------------------------  Physical Exam: BP (!) 142/90 (BP Location: Left Arm, Patient Position: Sitting, Cuff Size: Normal)   Pulse 66   Ht 5' 3.5" (1.613 m)   Wt 172 lb (78 kg)   BMI 29.99 kg/m   General: NAD. HEENT: No conjunctival pallor or scleral icterus. Moist mucous membranes.  OP clear. Neck: Supple without lymphadenopathy, thyromegaly, JVD, or HJR. No carotid bruit. Lungs: Normal work of breathing. Clear to auscultation bilaterally without wheezes or crackles. Heart: We will rate and rhythm with occasional extrasystoles.  No murmurs, rubs, or gallops.  Unable to assess PMI due to body habitus. Abd: Bowel sounds present. Soft, NT/ND without  hepatosplenomegaly Ext: No lower extremity edema. Radial, PT, and DP pulses are 2+ bilaterally. Skin: Warm and dry without rash.  EKG: Normal sinus rhythm with PACs and PVCs.  Borderline LVH.  Recent Labs       Lab Results  Component Value Date   WBC 5.1 12/16/2017   HGB 12.9 12/16/2017   HCT 37.3 12/16/2017   MCV 93.3 12/16/2017   PLT 157.0 12/16/2017      Recent Labs       Lab Results  Component Value Date   NA 139 12/16/2017   K 3.7 12/16/2017   CL 104 12/16/2017   CO2 24 12/16/2017   BUN 17 12/16/2017   CREATININE 0.91 12/16/2017   GLUCOSE 110 (H) 12/16/2017   ALT 16 12/16/2017      Recent Labs       Lab Results  Component Value Date   CHOL 119 08/22/2017   HDL 57 08/22/2017   LDLCALC 46 08/22/2017   TRIG 81  08/22/2017   CHOLHDL 2.1 08/22/2017      --------------------------------------------------------------------------------------------------  ASSESSMENT AND PLAN: Lightheadedness Much improved since prior visit.  No syncopal episodes.  Blood pressure today is mildly elevated, though we will tolerated degree of permissive hypertension.  Extensive cardiac work-up thus far has been largely unrevealing.  No further testing is recommended at this time unless symptoms worsen.  Hypertension Blood pressure mildly elevated today.  Home readings have been anywhere from 120-160 systolic.  I will tolerate a degree of permissive hypertension, given history of lightheadedness and syncope in the past.  We will continue her current regimen of diltiazem and HCTZ.  Coronary artery disease with stable angina No episode of significant chest pain since our last visit.  We will plan to continue her current medications including diltiazem, aspirin, and atorvastatin.  PVC's PVC's again noted on EKG today.  Prior monitor did not show any sustained arrhythmias.  Given lack of symptoms, we will defer additional testing at this time.  Continue diltiazem.  Follow-up: Return to clinic in 6 months.  Kimberly Kendall, MD 01/21/2018 1:56 PM         Electronically signed by Kimberly Kendall, MD at 01/21/2018 8:13 PM  Office Visit on 01/21/2018   Office Visit on 01/21/2018     Detailed Report    Note shared with patient

## 2020-01-21 ENCOUNTER — Encounter
Admission: RE | Admit: 2020-01-21 | Discharge: 2020-01-21 | Disposition: A | Discharge status: Home / Self Care | Payer: Medicare HMO | Source: Ambulatory Visit | Attending: Neurosurgery | Admitting: Neurosurgery

## 2020-01-21 ENCOUNTER — Inpatient Hospital Stay: Admission: RE | Admit: 2020-01-21 | Payer: Medicare PPO | Source: Ambulatory Visit

## 2020-01-21 DIAGNOSIS — Z01818 Encounter for other preprocedural examination: Secondary | ICD-10-CM | POA: Diagnosis not present

## 2020-01-21 DIAGNOSIS — Z20822 Contact with and (suspected) exposure to covid-19: Secondary | ICD-10-CM | POA: Diagnosis not present

## 2020-01-21 LAB — CBC
HCT: 37.6 % (ref 36.0–46.0)
Hemoglobin: 13.5 g/dL (ref 12.0–15.0)
MCH: 32.3 pg (ref 26.0–34.0)
MCHC: 35.9 g/dL (ref 30.0–36.0)
MCV: 90 fL (ref 80.0–100.0)
Platelets: 147 10*3/uL — ABNORMAL LOW (ref 150–400)
RBC: 4.18 MIL/uL (ref 3.87–5.11)
RDW: 12.6 % (ref 11.5–15.5)
WBC: 4.9 10*3/uL (ref 4.0–10.5)
nRBC: 0 % (ref 0.0–0.2)

## 2020-01-21 LAB — PROTIME-INR
INR: 1 (ref 0.8–1.2)
Prothrombin Time: 12.3 seconds (ref 11.4–15.2)

## 2020-01-21 LAB — URINALYSIS, ROUTINE W REFLEX MICROSCOPIC
Bilirubin Urine: NEGATIVE
Glucose, UA: NEGATIVE mg/dL
Hgb urine dipstick: NEGATIVE
Ketones, ur: NEGATIVE mg/dL
Nitrite: POSITIVE — AB
Protein, ur: NEGATIVE mg/dL
Specific Gravity, Urine: 1.015 (ref 1.005–1.030)
WBC, UA: >50 WBC/hpf — ABNORMAL HIGH (ref 0–5)
pH: 5 (ref 5.0–8.0)

## 2020-01-21 LAB — BASIC METABOLIC PANEL
Anion gap: 8 (ref 5–15)
BUN: 17 mg/dL (ref 8–23)
CO2: 26 mmol/L (ref 22–32)
Calcium: 9.2 mg/dL (ref 8.9–10.3)
Chloride: 106 mmol/L (ref 98–111)
Creatinine, Ser: 1.05 mg/dL — ABNORMAL HIGH (ref 0.44–1.00)
GFR calc Af Amer: >60 mL/min (ref >60)
GFR calc non Af Amer: 52 mL/min — ABNORMAL LOW (ref >60)
Glucose, Bld: 107 mg/dL — ABNORMAL HIGH (ref 70–99)
Potassium: 3.8 mmol/L (ref 3.5–5.1)
Sodium: 140 mmol/L (ref 135–145)

## 2020-01-21 LAB — TYPE AND SCREEN
ABO/RH(D): AB POS
Antibody Screen: NEGATIVE

## 2020-01-21 LAB — SURGICAL PCR SCREEN
MRSA, PCR: NEGATIVE
Staphylococcus aureus: NEGATIVE

## 2020-01-21 LAB — APTT: aPTT: 29 seconds (ref 24–36)

## 2020-01-21 LAB — SARS CORONAVIRUS 2 (TAT 6-24 HRS): SARS Coronavirus 2: NEGATIVE

## 2020-01-21 NOTE — Pre-Procedure Instructions (Signed)
Pre-Admit Testing Provider Notification Note  Provider Notified: Dr. Adriana Simas  Notification Mode: Fax  Reason: Abnormal lab result. (UA)  Response: Fax confirmation received.   Additional Information: Placed on chart. Noted on Pre-Admit Worksheet.  Signed: Alvester Morin, RN

## 2020-01-24 ENCOUNTER — Ambulatory Visit: Payer: Medicare HMO | Admitting: Anesthesiology

## 2020-01-24 ENCOUNTER — Ambulatory Visit: Payer: Medicare HMO

## 2020-01-24 ENCOUNTER — Observation Stay
Admission: RE | Admit: 2020-01-24 | Discharge: 2020-01-25 | Disposition: A | Discharge status: Home / Self Care | Payer: Medicare HMO | Attending: Neurosurgery | Admitting: Neurosurgery

## 2020-01-24 ENCOUNTER — Encounter
Admission: RE | Disposition: A | Discharge status: Home / Self Care | Payer: Self-pay | Source: Home / Self Care | Attending: Neurosurgery

## 2020-01-24 ENCOUNTER — Encounter: Payer: Self-pay | Admitting: Neurosurgery

## 2020-01-24 ENCOUNTER — Other Ambulatory Visit: Payer: Self-pay

## 2020-01-24 DIAGNOSIS — E039 Hypothyroidism, unspecified: Secondary | ICD-10-CM | POA: Diagnosis not present

## 2020-01-24 DIAGNOSIS — M1990 Unspecified osteoarthritis, unspecified site: Secondary | ICD-10-CM | POA: Diagnosis not present

## 2020-01-24 DIAGNOSIS — Z7982 Long term (current) use of aspirin: Secondary | ICD-10-CM | POA: Insufficient documentation

## 2020-01-24 DIAGNOSIS — N183 Chronic kidney disease, stage 3 unspecified: Secondary | ICD-10-CM | POA: Diagnosis not present

## 2020-01-24 DIAGNOSIS — G8929 Other chronic pain: Secondary | ICD-10-CM | POA: Diagnosis present

## 2020-01-24 DIAGNOSIS — Z79899 Other long term (current) drug therapy: Secondary | ICD-10-CM | POA: Insufficient documentation

## 2020-01-24 DIAGNOSIS — Z9682 Presence of neurostimulator: Secondary | ICD-10-CM | POA: Diagnosis not present

## 2020-01-24 DIAGNOSIS — K219 Gastro-esophageal reflux disease without esophagitis: Secondary | ICD-10-CM | POA: Diagnosis not present

## 2020-01-24 DIAGNOSIS — Z96652 Presence of left artificial knee joint: Secondary | ICD-10-CM | POA: Insufficient documentation

## 2020-01-24 DIAGNOSIS — E785 Hyperlipidemia, unspecified: Secondary | ICD-10-CM | POA: Diagnosis not present

## 2020-01-24 DIAGNOSIS — Z7722 Contact with and (suspected) exposure to environmental tobacco smoke (acute) (chronic): Secondary | ICD-10-CM | POA: Diagnosis not present

## 2020-01-24 DIAGNOSIS — I129 Hypertensive chronic kidney disease with stage 1 through stage 4 chronic kidney disease, or unspecified chronic kidney disease: Secondary | ICD-10-CM | POA: Diagnosis not present

## 2020-01-24 DIAGNOSIS — F329 Major depressive disorder, single episode, unspecified: Secondary | ICD-10-CM | POA: Diagnosis not present

## 2020-01-24 DIAGNOSIS — Z79890 Hormone replacement therapy (postmenopausal): Secondary | ICD-10-CM | POA: Diagnosis not present

## 2020-01-24 DIAGNOSIS — Z8673 Personal history of transient ischemic attack (TIA), and cerebral infarction without residual deficits: Secondary | ICD-10-CM | POA: Insufficient documentation

## 2020-01-24 DIAGNOSIS — G894 Chronic pain syndrome: Principal | ICD-10-CM | POA: Insufficient documentation

## 2020-01-24 DIAGNOSIS — F419 Anxiety disorder, unspecified: Secondary | ICD-10-CM | POA: Insufficient documentation

## 2020-01-24 DIAGNOSIS — Z7983 Long term (current) use of bisphosphonates: Secondary | ICD-10-CM | POA: Diagnosis not present

## 2020-01-24 DIAGNOSIS — I2510 Atherosclerotic heart disease of native coronary artery without angina pectoris: Secondary | ICD-10-CM | POA: Diagnosis not present

## 2020-01-24 DIAGNOSIS — E559 Vitamin D deficiency, unspecified: Secondary | ICD-10-CM | POA: Insufficient documentation

## 2020-01-24 DIAGNOSIS — Z419 Encounter for procedure for purposes other than remedying health state, unspecified: Secondary | ICD-10-CM

## 2020-01-24 HISTORY — PX: THORACIC LAMINECTOMY FOR SPINAL CORD STIMULATOR: SHX6887

## 2020-01-24 LAB — ABO/RH: ABO/RH(D): AB POS

## 2020-01-24 SURGERY — THORACIC LAMINECTOMY FOR SPINAL CORD STIMULATOR
Anesthesia: General | Site: Back

## 2020-01-24 MED ORDER — LIDOCAINE HCL (PF) 2 % IJ SOLN
INTRAMUSCULAR | Status: AC
  Filled 2020-01-24: qty 5

## 2020-01-24 MED ORDER — CEFAZOLIN SODIUM-DEXTROSE 2-4 GM/100ML-% IV SOLN
2.0000 g | Freq: Three times a day (TID) | INTRAVENOUS | Status: DC
  Filled 2020-01-24 (×2): qty 100

## 2020-01-24 MED ORDER — REMIFENTANIL HCL 1 MG IV SOLR
INTRAVENOUS | Status: AC
  Filled 2020-01-24: qty 1000

## 2020-01-24 MED ORDER — POLYETHYLENE GLYCOL 3350 17 G PO PACK: 17.0000 g | PACK | Freq: Every day | ORAL | Status: DC | PRN

## 2020-01-24 MED ORDER — OXYCODONE HCL 5 MG PO TABS
ORAL_TABLET | ORAL | Status: AC
  Filled 2020-01-24: qty 1

## 2020-01-24 MED ORDER — THROMBIN 5000 UNITS EX SOLR
CUTANEOUS | Status: AC
  Filled 2020-01-24: qty 5000

## 2020-01-24 MED ORDER — VANCOMYCIN HCL 1000 MG IV SOLR
INTRAVENOUS | Status: AC
  Filled 2020-01-24: qty 1000

## 2020-01-24 MED ORDER — REMIFENTANIL HCL 1 MG IV SOLR
INTRAVENOUS | Status: DC | PRN
  Administered 2020-01-24: .1 ug/kg/min via INTRAVENOUS

## 2020-01-24 MED ORDER — DILTIAZEM HCL ER 60 MG PO CP12
60.0000 mg | ORAL_CAPSULE | Freq: Two times a day (BID) | ORAL | Status: DC
  Administered 2020-01-25: 60 mg via ORAL
  Filled 2020-01-24 (×2): qty 1

## 2020-01-24 MED ORDER — CEFAZOLIN SODIUM-DEXTROSE 1-4 GM/50ML-% IV SOLN
1.0000 g | Freq: Three times a day (TID) | INTRAVENOUS | Status: AC
  Administered 2020-01-24 – 2020-01-25 (×2): 1 g via INTRAVENOUS
  Filled 2020-01-24 (×2): qty 50

## 2020-01-24 MED ORDER — FENTANYL CITRATE (PF) 100 MCG/2ML IJ SOLN
INTRAMUSCULAR | Status: AC
  Filled 2020-01-24: qty 2

## 2020-01-24 MED ORDER — MIDAZOLAM HCL 2 MG/2ML IJ SOLN
INTRAMUSCULAR | Status: DC | PRN
  Administered 2020-01-24: 1 mg via INTRAVENOUS

## 2020-01-24 MED ORDER — KETAMINE HCL 50 MG/ML IJ SOLN
INTRAMUSCULAR | Status: DC | PRN
  Administered 2020-01-24 (×2): 25 mg via INTRAVENOUS

## 2020-01-24 MED ORDER — ROCURONIUM BROMIDE 100 MG/10ML IV SOLN
INTRAVENOUS | Status: DC | PRN
  Administered 2020-01-24: 10 mg via INTRAVENOUS

## 2020-01-24 MED ORDER — ACETAMINOPHEN 325 MG PO TABS: 650.0000 mg | ORAL_TABLET | ORAL | Status: DC | PRN

## 2020-01-24 MED ORDER — MONTELUKAST SODIUM 10 MG PO TABS
10.0000 mg | ORAL_TABLET | Freq: Every day | ORAL | Status: DC
  Administered 2020-01-24: 10 mg via ORAL
  Filled 2020-01-24: qty 1

## 2020-01-24 MED ORDER — TIZANIDINE HCL 2 MG PO TABS
2.0000 mg | ORAL_TABLET | Freq: Three times a day (TID) | ORAL | Status: DC | PRN
  Filled 2020-01-24: qty 1

## 2020-01-24 MED ORDER — GABAPENTIN 300 MG PO CAPS
600.0000 mg | ORAL_CAPSULE | Freq: Every day | ORAL | Status: DC
  Administered 2020-01-24: 600 mg via ORAL
  Filled 2020-01-24: qty 2

## 2020-01-24 MED ORDER — BUPIVACAINE-EPINEPHRINE (PF) 0.5% -1:200000 IJ SOLN
INTRAMUSCULAR | Status: DC | PRN
  Administered 2020-01-24: 8 mL

## 2020-01-24 MED ORDER — LACTATED RINGERS IV SOLN: INTRAVENOUS | Status: DC

## 2020-01-24 MED ORDER — ACETAMINOPHEN 650 MG RE SUPP: 650.0000 mg | RECTAL | Status: DC | PRN

## 2020-01-24 MED ORDER — SODIUM CHLORIDE 0.9 % IV SOLN
INTRAVENOUS | Status: DC | PRN
  Administered 2020-01-24: 50 ug/min via INTRAVENOUS

## 2020-01-24 MED ORDER — HYDROCHLOROTHIAZIDE 12.5 MG PO CAPS
12.5000 mg | ORAL_CAPSULE | Freq: Every day | ORAL | Status: DC
  Administered 2020-01-25: 12.5 mg via ORAL
  Filled 2020-01-24: qty 1

## 2020-01-24 MED ORDER — PHENYLEPHRINE HCL (PRESSORS) 10 MG/ML IV SOLN
INTRAVENOUS | Status: DC | PRN
  Administered 2020-01-24 (×2): 100 ug via INTRAVENOUS

## 2020-01-24 MED ORDER — FAMOTIDINE 20 MG PO TABS
ORAL_TABLET | ORAL | Status: AC
  Administered 2020-01-24: 20 mg via ORAL
  Filled 2020-01-24: qty 1

## 2020-01-24 MED ORDER — LEVOTHYROXINE SODIUM 88 MCG PO TABS
88.0000 ug | ORAL_TABLET | Freq: Every day | ORAL | Status: DC
  Administered 2020-01-25: 88 ug via ORAL
  Filled 2020-01-24: qty 1

## 2020-01-24 MED ORDER — PHENOL 1.4 % MT LIQD
1.0000 | OROMUCOSAL | Status: DC | PRN
  Filled 2020-01-24: qty 177

## 2020-01-24 MED ORDER — CEFAZOLIN SODIUM-DEXTROSE 2-4 GM/100ML-% IV SOLN
2.0000 g | INTRAVENOUS | Status: AC
  Administered 2020-01-24: 2 g via INTRAVENOUS

## 2020-01-24 MED ORDER — FENTANYL CITRATE (PF) 100 MCG/2ML IJ SOLN
25.0000 ug | INTRAMUSCULAR | Status: DC | PRN
  Administered 2020-01-24: 50 ug via INTRAVENOUS
  Administered 2020-01-24: 25 ug via INTRAVENOUS

## 2020-01-24 MED ORDER — FAMOTIDINE 20 MG PO TABS: 20.0000 mg | ORAL_TABLET | Freq: Once | ORAL | Status: AC

## 2020-01-24 MED ORDER — PROPOFOL 500 MG/50ML IV EMUL
INTRAVENOUS | Status: DC | PRN
  Administered 2020-01-24: 150 ug/kg/min via INTRAVENOUS
  Administered 2020-01-24: 100 ug/kg/min via INTRAVENOUS

## 2020-01-24 MED ORDER — BISACODYL 10 MG RE SUPP: 10.0000 mg | Freq: Every day | RECTAL | Status: DC | PRN

## 2020-01-24 MED ORDER — LORAZEPAM 0.5 MG PO TABS
0.5000 mg | ORAL_TABLET | Freq: Every evening | ORAL | Status: DC | PRN
  Filled 2020-01-24: qty 1

## 2020-01-24 MED ORDER — ACETAMINOPHEN 10 MG/ML IV SOLN
INTRAVENOUS | Status: AC
  Filled 2020-01-24: qty 100

## 2020-01-24 MED ORDER — HYDRALAZINE HCL 10 MG PO TABS
20.0000 mg | ORAL_TABLET | Freq: Two times a day (BID) | ORAL | Status: DC | PRN
  Filled 2020-01-24: qty 2

## 2020-01-24 MED ORDER — PROPOFOL 500 MG/50ML IV EMUL
INTRAVENOUS | Status: AC
  Filled 2020-01-24: qty 50

## 2020-01-24 MED ORDER — CHLORHEXIDINE GLUCONATE 0.12 % MT SOLN
OROMUCOSAL | Status: AC
  Administered 2020-01-24: 15 mL via OROMUCOSAL
  Filled 2020-01-24: qty 15

## 2020-01-24 MED ORDER — SODIUM CHLORIDE 0.9% FLUSH
3.0000 mL | Freq: Two times a day (BID) | INTRAVENOUS | Status: DC
  Administered 2020-01-25: 3 mL via INTRAVENOUS

## 2020-01-24 MED ORDER — OXYCODONE HCL 5 MG PO TABS
5.0000 mg | ORAL_TABLET | Freq: Once | ORAL | Status: AC | PRN
  Administered 2020-01-24: 5 mg via ORAL

## 2020-01-24 MED ORDER — CEFAZOLIN SODIUM-DEXTROSE 2-4 GM/100ML-% IV SOLN: 2.0000 g | Freq: Three times a day (TID) | INTRAVENOUS | Status: DC

## 2020-01-24 MED ORDER — THROMBIN 5000 UNITS EX SOLR
CUTANEOUS | Status: DC | PRN
  Administered 2020-01-24: 5000 [IU] via TOPICAL

## 2020-01-24 MED ORDER — FENTANYL CITRATE (PF) 100 MCG/2ML IJ SOLN
INTRAMUSCULAR | Status: AC
  Administered 2020-01-24: 25 ug via INTRAVENOUS
  Filled 2020-01-24: qty 2

## 2020-01-24 MED ORDER — ONDANSETRON HCL 4 MG/2ML IJ SOLN
INTRAMUSCULAR | Status: AC
  Filled 2020-01-24: qty 2

## 2020-01-24 MED ORDER — ONDANSETRON HCL 4 MG PO TABS: 4.0000 mg | ORAL_TABLET | Freq: Four times a day (QID) | ORAL | Status: DC | PRN

## 2020-01-24 MED ORDER — ACETAMINOPHEN 10 MG/ML IV SOLN: 1000.0000 mg | Freq: Once | INTRAVENOUS | Status: DC | PRN

## 2020-01-24 MED ORDER — ACETAMINOPHEN 10 MG/ML IV SOLN
INTRAVENOUS | Status: DC | PRN
  Administered 2020-01-24: 1000 mg via INTRAVENOUS

## 2020-01-24 MED ORDER — PANTOPRAZOLE SODIUM 40 MG PO TBEC
40.0000 mg | DELAYED_RELEASE_TABLET | Freq: Every day | ORAL | Status: DC
  Administered 2020-01-25: 40 mg via ORAL
  Filled 2020-01-24: qty 1

## 2020-01-24 MED ORDER — OXYCODONE HCL 5 MG PO TABS
5.0000 mg | ORAL_TABLET | ORAL | Status: DC | PRN
  Administered 2020-01-25: 5 mg via ORAL
  Filled 2020-01-24: qty 1

## 2020-01-24 MED ORDER — MIRABEGRON ER 50 MG PO TB24
50.0000 mg | ORAL_TABLET | Freq: Every day | ORAL | Status: DC
  Administered 2020-01-24: 50 mg via ORAL
  Filled 2020-01-24 (×2): qty 1

## 2020-01-24 MED ORDER — SODIUM CHLORIDE 0.9 % IV SOLN
INTRAVENOUS | Status: DC | PRN
Start: 2020-01-24 — End: 2020-01-24

## 2020-01-24 MED ORDER — ESCITALOPRAM OXALATE 10 MG PO TABS
5.0000 mg | ORAL_TABLET | Freq: Every day | ORAL | Status: DC
  Administered 2020-01-25: 5 mg via ORAL
  Filled 2020-01-24: qty 0.5

## 2020-01-24 MED ORDER — HYDROMORPHONE HCL 1 MG/ML IJ SOLN: 0.5000 mg | INTRAMUSCULAR | Status: DC | PRN

## 2020-01-24 MED ORDER — ATORVASTATIN CALCIUM 20 MG PO TABS
20.0000 mg | ORAL_TABLET | Freq: Every day | ORAL | Status: DC
  Administered 2020-01-24: 20 mg via ORAL
  Filled 2020-01-24: qty 1

## 2020-01-24 MED ORDER — SENNA 8.6 MG PO TABS
1.0000 | ORAL_TABLET | Freq: Two times a day (BID) | ORAL | Status: DC
  Administered 2020-01-24 – 2020-01-25 (×2): 8.6 mg via ORAL
  Filled 2020-01-24 (×2): qty 1

## 2020-01-24 MED ORDER — ORAL CARE MOUTH RINSE
15.0000 mL | Freq: Once | OROMUCOSAL | Status: AC
  Administered 2020-01-24: 15 mL via OROMUCOSAL

## 2020-01-24 MED ORDER — MENTHOL 3 MG MT LOZG
1.0000 | LOZENGE | OROMUCOSAL | Status: DC | PRN
  Filled 2020-01-24: qty 9

## 2020-01-24 MED ORDER — FLUTICASONE PROPIONATE 50 MCG/ACT NA SUSP: 2.0000 | Freq: Every day | NASAL | Status: DC | PRN

## 2020-01-24 MED ORDER — SODIUM CHLORIDE 0.9 % IV SOLN
250.0000 mL | INTRAVENOUS | Status: DC
  Administered 2020-01-24 – 2020-01-25 (×2): 250 mL via INTRAVENOUS

## 2020-01-24 MED ORDER — SODIUM CHLORIDE (PF) 0.9 % IJ SOLN
INTRAMUSCULAR | Status: AC
  Filled 2020-01-24: qty 20

## 2020-01-24 MED ORDER — CEFAZOLIN SODIUM-DEXTROSE 2-4 GM/100ML-% IV SOLN
INTRAVENOUS | Status: AC
  Filled 2020-01-24: qty 100

## 2020-01-24 MED ORDER — BUPIVACAINE-EPINEPHRINE (PF) 0.5% -1:200000 IJ SOLN
INTRAMUSCULAR | Status: AC
  Filled 2020-01-24: qty 30

## 2020-01-24 MED ORDER — GLYCOPYRROLATE 0.2 MG/ML IJ SOLN
INTRAMUSCULAR | Status: AC
  Filled 2020-01-24: qty 1

## 2020-01-24 MED ORDER — LORATADINE 10 MG PO TABS
10.0000 mg | ORAL_TABLET | Freq: Every day | ORAL | Status: DC
  Administered 2020-01-25: 10 mg via ORAL
  Filled 2020-01-24: qty 1

## 2020-01-24 MED ORDER — FENTANYL CITRATE (PF) 100 MCG/2ML IJ SOLN
INTRAMUSCULAR | Status: AC
  Administered 2020-01-24: 50 ug via INTRAVENOUS
  Filled 2020-01-24: qty 2

## 2020-01-24 MED ORDER — POTASSIUM CHLORIDE CRYS ER 10 MEQ PO TBCR
10.0000 meq | EXTENDED_RELEASE_TABLET | Freq: Every day | ORAL | Status: DC
  Administered 2020-01-25: 10 meq via ORAL
  Filled 2020-01-24: qty 1

## 2020-01-24 MED ORDER — ONDANSETRON HCL 4 MG/2ML IJ SOLN: 4.0000 mg | Freq: Four times a day (QID) | INTRAMUSCULAR | Status: DC | PRN

## 2020-01-24 MED ORDER — DEXAMETHASONE SODIUM PHOSPHATE 10 MG/ML IJ SOLN
INTRAMUSCULAR | Status: DC | PRN
  Administered 2020-01-24: 10 mg via INTRAVENOUS

## 2020-01-24 MED ORDER — ONDANSETRON HCL 4 MG/2ML IJ SOLN: 4.0000 mg | Freq: Once | INTRAMUSCULAR | Status: DC | PRN

## 2020-01-24 MED ORDER — LIDOCAINE HCL (CARDIAC) PF 100 MG/5ML IV SOSY
PREFILLED_SYRINGE | INTRAVENOUS | Status: DC | PRN
  Administered 2020-01-24: 50 mg via INTRAVENOUS

## 2020-01-24 MED ORDER — SUCCINYLCHOLINE CHLORIDE 200 MG/10ML IV SOSY
PREFILLED_SYRINGE | INTRAVENOUS | Status: AC
  Filled 2020-01-24: qty 10

## 2020-01-24 MED ORDER — SODIUM CHLORIDE 0.9% FLUSH: 3.0000 mL | INTRAVENOUS | Status: DC | PRN

## 2020-01-24 MED ORDER — PROPOFOL 10 MG/ML IV BOLUS
INTRAVENOUS | Status: AC
  Filled 2020-01-24: qty 20

## 2020-01-24 MED ORDER — OXYCODONE HCL 5 MG/5ML PO SOLN: 5.0000 mg | Freq: Once | ORAL | Status: AC | PRN

## 2020-01-24 MED ORDER — CHLORHEXIDINE GLUCONATE 0.12 % MT SOLN: 15.0000 mL | Freq: Once | OROMUCOSAL | Status: AC

## 2020-01-24 MED ORDER — GLYCOPYRROLATE 0.2 MG/ML IJ SOLN
INTRAMUSCULAR | Status: DC | PRN
  Administered 2020-01-24: .2 mg via INTRAVENOUS

## 2020-01-24 MED ORDER — SUCCINYLCHOLINE CHLORIDE 20 MG/ML IJ SOLN
INTRAMUSCULAR | Status: DC | PRN
  Administered 2020-01-24: 100 mg via INTRAVENOUS

## 2020-01-24 MED ORDER — MIDAZOLAM HCL 2 MG/2ML IJ SOLN
INTRAMUSCULAR | Status: AC
  Filled 2020-01-24: qty 2

## 2020-01-24 MED ORDER — ONDANSETRON HCL 4 MG/2ML IJ SOLN
INTRAMUSCULAR | Status: DC | PRN
  Administered 2020-01-24: 4 mg via INTRAVENOUS

## 2020-01-24 MED ORDER — FLEET ENEMA 7-19 GM/118ML RE ENEM: 1.0000 | ENEMA | Freq: Once | RECTAL | Status: DC | PRN

## 2020-01-24 MED ORDER — FENTANYL CITRATE (PF) 100 MCG/2ML IJ SOLN
INTRAMUSCULAR | Status: DC | PRN
  Administered 2020-01-24 (×2): 50 ug via INTRAVENOUS

## 2020-01-24 MED ORDER — SODIUM CHLORIDE 0.9 % IV SOLN: 250.0000 mL | INTRAVENOUS | Status: DC

## 2020-01-24 MED ORDER — OXYCODONE HCL 5 MG PO TABS
10.0000 mg | ORAL_TABLET | ORAL | Status: DC | PRN
  Administered 2020-01-24 – 2020-01-25 (×3): 10 mg via ORAL
  Filled 2020-01-24 (×3): qty 2

## 2020-01-24 SURGICAL SUPPLY — 68 items
BLADE BOVIE TIP EXT 4 (BLADE) ×2 IMPLANT
BUR NEURO DRILL SOFT 3.0X3.8M (BURR) ×2 IMPLANT
CANISTER SUCT 1200ML W/VALVE (MISCELLANEOUS) ×4 IMPLANT
CHLORAPREP W/TINT 26 (MISCELLANEOUS) ×2 IMPLANT
CNTNR SPEC 2.5X3XGRAD LEK (MISCELLANEOUS) ×1
CONT SPEC 4OZ STER OR WHT (MISCELLANEOUS) ×1
CONT SPEC 4OZ STRL OR WHT (MISCELLANEOUS) ×1
CONTAINER SPEC 2.5X3XGRAD LEK (MISCELLANEOUS) ×1 IMPLANT
COUNTER NEEDLE 20/40 LG (NEEDLE) ×2 IMPLANT
COVER LIGHT HANDLE STERIS (MISCELLANEOUS) ×4 IMPLANT
COVER WAND RF STERILE (DRAPES) ×2 IMPLANT
CUP MEDICINE 2OZ PLAST GRAD ST (MISCELLANEOUS) ×2 IMPLANT
DERMABOND ADVANCED (GAUZE/BANDAGES/DRESSINGS) ×1
DERMABOND ADVANCED .7 DNX12 (GAUZE/BANDAGES/DRESSINGS) ×1 IMPLANT
DRAPE C-ARM XRAY 36X54 (DRAPES) ×4 IMPLANT
DRAPE LAPAROTOMY 100X77 ABD (DRAPES) ×2 IMPLANT
DRAPE MICROSCOPE SPINE 48X150 (DRAPES) IMPLANT
DRAPE SURG 17X11 SM STRL (DRAPES) ×2 IMPLANT
DURASEAL APPLICATOR TIP (TIP) IMPLANT
DURASEAL SPINE SEALANT 3ML (MISCELLANEOUS) IMPLANT
ELECT CAUTERY BLADE TIP 2.5 (TIP) ×2
ELECT EZSTD 165MM 6.5IN (MISCELLANEOUS) ×2
ELECT REM PT RETURN 9FT ADLT (ELECTROSURGICAL) ×2
ELECTRODE CAUTERY BLDE TIP 2.5 (TIP) ×1 IMPLANT
ELECTRODE EZSTD 165MM 6.5IN (MISCELLANEOUS) ×1 IMPLANT
ELECTRODE REM PT RTRN 9FT ADLT (ELECTROSURGICAL) ×1 IMPLANT
FEE INTRAOP MONITOR IMPULS NCS (MISCELLANEOUS) ×1 IMPLANT
GAUZE SPONGE 4X4 12PLY STRL (GAUZE/BANDAGES/DRESSINGS) IMPLANT
GLOVE BIOGEL PI IND STRL 7.0 (GLOVE) ×1 IMPLANT
GLOVE BIOGEL PI IND STRL 8 (GLOVE) ×1 IMPLANT
GLOVE BIOGEL PI INDICATOR 7.0 (GLOVE) ×1
GLOVE BIOGEL PI INDICATOR 8 (GLOVE) ×1
GLOVE SURG SYN 7.0 (GLOVE) ×4 IMPLANT
GLOVE SURG SYN 8.0 (GLOVE) ×2 IMPLANT
GOWN STRL REUS W/ TWL LRG LVL3 (GOWN DISPOSABLE) ×1 IMPLANT
GOWN STRL REUS W/ TWL XL LVL3 (GOWN DISPOSABLE) ×2 IMPLANT
GOWN STRL REUS W/TWL LRG LVL3 (GOWN DISPOSABLE) ×2
GOWN STRL REUS W/TWL XL LVL3 (GOWN DISPOSABLE) ×4
GRADUATE 1200CC STRL 31836 (MISCELLANEOUS) ×2 IMPLANT
INTRAOP MONITOR FEE IMPULS NCS (MISCELLANEOUS) ×1
INTRAOP MONITOR FEE IMPULSE (MISCELLANEOUS) ×2
KIT EXT SPINAL CORD STIMULATOR (Orthopedic Implant) ×4 IMPLANT
KIT TURNOVER KIT A (KITS) ×2 IMPLANT
MARKER SKIN DUAL TIP RULER LAB (MISCELLANEOUS) ×2 IMPLANT
NDL SAFETY ECLIPSE 18X1.5 (NEEDLE) ×1 IMPLANT
NEEDLE HYPO 18GX1.5 SHARP (NEEDLE) ×2
NEEDLE HYPO 22GX1.5 SAFETY (NEEDLE) ×2 IMPLANT
NS IRRIG 1000ML POUR BTL (IV SOLUTION) ×2 IMPLANT
PACK LAMINECTOMY NEURO (CUSTOM PROCEDURE TRAY) ×2 IMPLANT
PAD ARMBOARD 7.5X6 YLW CONV (MISCELLANEOUS) ×2 IMPLANT
RECLAIM DISTAL TAPERED 25X190A (Hips) ×2 IMPLANT
SPOGE SURGIFLO 8M (HEMOSTASIS)
SPONGE SURGIFLO 8M (HEMOSTASIS) IMPLANT
STAPLER SKIN PROX 35W (STAPLE) IMPLANT
STIMULATOR CORD SURESCAN MRI (Stimulator) ×2 IMPLANT
STIMULATOR WIRELESS 74X79X20MM (MISCELLANEOUS) ×2 IMPLANT
SUT ETHILON 3-0 FS-10 30 BLK (SUTURE) ×4
SUT POLYSORB 2-0 5X18 GS-10 (SUTURE) ×4 IMPLANT
SUT SILK 2 0 SH (SUTURE) ×4 IMPLANT
SUT VIC AB 0 CT1 18XCR BRD 8 (SUTURE) ×2 IMPLANT
SUT VIC AB 0 CT1 8-18 (SUTURE) ×4
SUTURE EHLN 3-0 FS-10 30 BLK (SUTURE) ×2 IMPLANT
SYR 10ML LL (SYRINGE) ×4 IMPLANT
SYR 20ML LL LF (SYRINGE) ×2 IMPLANT
SYR 30ML LL (SYRINGE) ×4 IMPLANT
SYR 3ML LL SCALE MARK (SYRINGE) ×2 IMPLANT
TOWEL OR 17X26 4PK STRL BLUE (TOWEL DISPOSABLE) ×4 IMPLANT
TUBING CONNECTING 10 (TUBING) ×2 IMPLANT

## 2020-01-24 NOTE — Progress Notes (Signed)
Patient dressing on back is leaking where her device is. Placed gauze and Tegaderm over the original dressing. Applied ice pack to site. MD Adriana Simas notified. Will continue to assess site throughout the shift.

## 2020-01-24 NOTE — Consult Note (Signed)
Pharmacy Antibiotic Note  Kimberly Cook is a 74 y.o. female admitted on 01/24/2020 for a scheduled procedure. Pharmacy has been consulted for cefazolin dosing.  Plan: Administer cefazolin 2g IV x1 dose 30 mins prior to procedure.   Height: 5\' 3"  (160 cm) Weight: 72.6 kg (160 lb) IBW/kg (Calculated) : 52.4  Temp (24hrs), Avg:97.7 F (36.5 C), Min:97.7 F (36.5 C), Max:97.7 F (36.5 C)  Recent Labs  Lab 01/21/20 0941  WBC 4.9  CREATININE 1.05*    Estimated Creatinine Clearance: 44.2 mL/min (A) (by C-G formula based on SCr of 1.05 mg/dL (H)).    No Known Allergies   Thank you for allowing pharmacy to be a part of this patient's care.  01/23/20  PharmD Student  01/24/2020 10:34 AM

## 2020-01-24 NOTE — Anesthesia Preprocedure Evaluation (Signed)
Anesthesia Evaluation  Patient identified by MRN, date of birth, ID band Patient awake    Reviewed: Allergy & Precautions, NPO status , Patient's Chart, lab work & pertinent test results  History of Anesthesia Complications Negative for: history of anesthetic complications  Airway Mallampati: II  TM Distance: >3 FB Neck ROM: Full    Dental no notable dental hx. (+) Lower Dentures, Upper Dentures   Pulmonary neg pulmonary ROS, neg shortness of breath, neg sleep apnea, neg COPD, Patient abstained from smoking.Not current smoker,    Pulmonary exam normal breath sounds clear to auscultation       Cardiovascular Exercise Tolerance: Good METShypertension, Pt. on medications (-) CAD and (-) Past MI (-) dysrhythmias  Rhythm:Regular Rate:Normal - Systolic murmurs    Neuro/Psych  Headaches, PSYCHIATRIC DISORDERS Anxiety Depression TIA   GI/Hepatic Neg liver ROS, GERD  Medicated,  Endo/Other  neg diabetesHypothyroidism   Renal/GU Renal InsufficiencyRenal disease  negative genitourinary   Musculoskeletal  (+) Arthritis ,   Abdominal   Peds negative pediatric ROS (+)  Hematology negative hematology ROS (+)   Anesthesia Other Findings Past Medical History: No date: Allergy No date: Anxiety No date: Arthritis No date: Back pain No date: Chronic kidney disease     Comment:  STAGE 3 04/2017: Coronary artery disease     Comment:  Mild to moderate CAD in LAD/diagonal by CTA (CT-FFR of               apical LAD 0.79). No date: DDD (degenerative disc disease), lumbar No date: Depression No date: Diverticulosis No date: Essential hypertension     Comment:  Normal cardiolite 05/2006 EF 71% No date: GERD (gastroesophageal reflux disease) No date: Headache No date: History of shingles No date: Hyperlipidemia No date: Hypothyroidism No date: Lung nodule 2011 : Mini stroke (HCC) No date: Occipital neuralgia No date:  Palpitations 2018: Pneumonia No date: Prediabetes No date: Stroke [facility]) No date: Stroke [facility])     Comment:  MRI 04/2008 + left sup. frontal gyrus possibly puntate               infarct  2019: Syncope No date: Urinary tract infection No date: Vitamin D deficiency  Reproductive/Obstetrics                             Lab Results  Component Value Date   WBC 4.9 01/21/2020   HGB 13.5 01/21/2020   HCT 37.6 01/21/2020   MCV 90.0 01/21/2020   PLT 147 (L) 01/21/2020   Lab Results  Component Value Date   CREATININE 1.05 (H) 01/21/2020   BUN 17 01/21/2020   NA 140 01/21/2020   K 3.8 01/21/2020   CL 106 01/21/2020   CO2 26 01/21/2020   Lab Results  Component Value Date   INR 1.0 01/21/2020   INR 1.06 04/06/2015   INR 1.01 12/26/2009    Anesthesia Physical  Anesthesia Plan  ASA: II  Anesthesia Plan: General   Post-op Pain Management:    Induction: Intravenous  PONV Risk Score and Plan: 4 or greater and Ondansetron and Dexamethasone  Airway Management Planned: Oral ETT  Additional Equipment: None  Intra-op Plan:   Post-operative Plan: Extubation in OR  Informed Consent: I have reviewed the patients History and Physical, chart, labs and discussed the procedure including the risks, benefits and alternatives for the proposed anesthesia with the patient or authorized representative who has indicated his/her understanding and acceptance.  Dental advisory given  Plan Discussed with: CRNA  Anesthesia Plan Comments: (Discussed risks of anesthesia with patient, including PONV, sore throat, lip/dental damage. Rare risks discussed as well, such as cardiorespiratory and neurological sequelae. Patient understands.)        Anesthesia Quick Evaluation

## 2020-01-24 NOTE — OR Nursing (Signed)
Contacted DR. Zack and reported BP 171/116 at 10:17 and BP 166/107 at 1046. He advised he was "fine" with that.

## 2020-01-24 NOTE — Anesthesia Postprocedure Evaluation (Signed)
Anesthesia Post Note  Patient: Kimberly Cook  Procedure(s) Performed: THORACIC SPINAL CORD STIMULATOR PADDLE TRIAL VIA LAMINECTOMY (N/A Back)  Patient location during evaluation: PACU Anesthesia Type: General Level of consciousness: awake and alert Pain management: pain level controlled Vital Signs Assessment: post-procedure vital signs reviewed and stable Respiratory status: spontaneous breathing, nonlabored ventilation, respiratory function stable and patient connected to nasal cannula oxygen Cardiovascular status: blood pressure returned to baseline and stable Postop Assessment: no apparent nausea or vomiting Anesthetic complications: no   No complications documented.   Last Vitals:  Vitals:   01/24/20 1514 01/24/20 1524  BP: (!) 131/100 (!) 157/110  Pulse: 83 74  Resp: 20 18  Temp: (!) 36 C   SpO2: 100% 100%    Last Pain:  Vitals:   01/24/20 1514  TempSrc:   PainSc: 0-No pain                 Corinda Gubler

## 2020-01-24 NOTE — Anesthesia Procedure Notes (Signed)
Procedure Name: Intubation Performed by: Rolla Plate, CRNA Pre-anesthesia Checklist: Patient identified, Patient being monitored, Timeout performed, Emergency Drugs available and Suction available Patient Re-evaluated:Patient Re-evaluated prior to induction Oxygen Delivery Method: Circle system utilized Preoxygenation: Pre-oxygenation with 100% oxygen Induction Type: IV induction Ventilation: Mask ventilation without difficulty Laryngoscope Size: 3 and McGraph Grade View: Grade I Tube type: Oral Tube size: 7.0 mm Number of attempts: 1 Airway Equipment and Method: Stylet,  Video-laryngoscopy,  Bite block and LTA kit utilized Placement Confirmation: ETT inserted through vocal cords under direct vision,  positive ETCO2 and breath sounds checked- equal and bilateral Secured at: 21 cm Tube secured with: Tape Dental Injury: Teeth and Oropharynx as per pre-operative assessment

## 2020-01-24 NOTE — H&P (Signed)
Kimberly Cook is an 74 y.o. female.   Chief Complaint: Back and leg pain HPI: Kimberly Cook is here for evaluation of ongoing back and leg pain. She was last seen by me in 2020 and was evaluated for possible scoliosis surgery. She is increased her bone quality but after discussion with my partner regarding the risk versus benefits of a large decompression and fusion, she is here to discuss a possible spinal cord stimulator placement for pain. She states that she has pain going from the back into the legs which limits her activity. This does get relieved with sitting and avoiding activity. She does not endorse any obvious numbness in her legs. However, she does feel like her legs will give out her times and she does equate most of this to the pain. She is using a walker. Given failure of conservative management, she is ready to proceed with thoracic SCS trial  She has had an MRI of the thoracic spine which did not reveal any obvious stenosis.   Past Medical History:  Diagnosis Date  . Allergy   . Anxiety   . Arthritis   . Back pain   . Chronic kidney disease    STAGE 3  . Coronary artery disease 04/2017   Mild to moderate CAD in LAD/diagonal by CTA (CT-FFR of apical LAD 0.79).  . DDD (degenerative disc disease), lumbar   . Depression   . Diverticulosis   . Essential hypertension    Normal cardiolite 05/2006 EF 71%  . GERD (gastroesophageal reflux disease)   . Headache   . History of shingles   . Hyperlipidemia   . Hypothyroidism   . Lung nodule   . Mini stroke (HCC) 2011   . Occipital neuralgia   . Palpitations   . Pneumonia 2018  . Prediabetes   . Stroke (HCC)   . Stroke [facility])    MRI 04/2008 + left sup. frontal gyrus possibly puntate infarct   . Syncope 2019  . Urinary tract infection   . Vitamin D deficiency     Past Surgical History:  Procedure Laterality Date  . ABDOMINAL HYSTERECTOMY    . BLADDER SURGERY     2003  . BREAST EXCISIONAL BIOPSY Right Over 20 years     Benign  . CHOLECYSTECTOMY    . gastroplication     . JOINT REPLACEMENT Left    KNEE  . KNEE ARTHROSCOPY Left 2011  . TONSILLECTOMY AND ADENOIDECTOMY    . TOTAL KNEE ARTHROPLASTY Left 04/17/2015   Procedure: LEFT TOTAL KNEE ARTHROPLASTY;  Surgeon: Durene Romans, MD;  Location: [facility];  Service: Orthopedics;  Laterality: Left;    Family History  Problem Relation Age of Onset  . Heart disease Mother   . Hypertension Mother   . Diabetes Mother   . Heart attack Mother 16  . Heart disease Father   . Heart attack Father 13  . Breast cancer Maternal Aunt   . Heart attack Brother    Social History:  reports that she is a non-smoker but has been exposed to tobacco smoke. She has never used smokeless tobacco. She reports that she does not drink alcohol and does not use drugs.  Allergies: No Known Allergies  Medications Prior to Admission  Medication Sig Dispense Refill  . alendronate (FOSAMAX) 70 MG tablet Take 70 mg by mouth once a week. Fridays    . aspirin EC 81 MG tablet Take 1 tablet (81 mg total) by mouth daily. 90 tablet 3  .  atorvastatin (LIPITOR) 20 MG tablet Take 1 tablet (20 mg total) by mouth daily. (Patient taking differently: Take 20 mg by mouth at bedtime. ) 90 tablet 3  . Biotin 1 MG CAPS Take 1 mg by mouth daily.     Marland Kitchen CALCIUM PO Take 1,200 mg by mouth daily.     . cetirizine (ZYRTEC) 10 MG tablet Take 10 mg by mouth every morning.    . Cholecalciferol (VITAMIN D3) 125 MCG (5000 UT) TABS Take 1 tablet (5,000 Units total) by mouth daily. 90 tablet 3  . Cranberry 500 MG CAPS Take 500 mg by mouth daily.    Marland Kitchen diltiazem (CARDIZEM SR) 60 MG 12 hr capsule Take 1 capsule (60 mg total) by mouth 2 (two) times daily. 180 capsule 3  . escitalopram (LEXAPRO) 5 MG tablet Take 1 tablet (5 mg total) by mouth daily. (Patient taking differently: Take 5 mg by mouth every morning. ) 90 tablet 3  . FLUAD QUADRIVALENT 0.5 ML injection     . fluticasone (FLONASE) 50 MCG/ACT nasal spray Place 2  sprays into both nostrils daily as needed for allergies or rhinitis. Use after nasal saline 16 g 12  . folic acid (FOLVITE) 267 MCG tablet Take 1 tablet (400 mcg total) by mouth daily. SEPARATE ALL SUPPLEMENTS TO LUNCH OR DINNER AND PRILOSEC NOT TO MESS W/THYROID MED 90 tablet 3  . gabapentin (NEURONTIN) 300 MG capsule 1 pill in the am and lunch and 2 pills qhs (Patient taking differently: Take 600 mg by mouth at bedtime. )    . hydrALAZINE (APRESOLINE) 10 MG tablet Take 2 tablets (20 mg total) by mouth 2 (two) times daily as needed. BP>140/>90 180 tablet 3  . hydrochlorothiazide (MICROZIDE) 12.5 MG capsule Take 1 capsule (12.5 mg total) by mouth daily. In am 90 capsule 3  . levothyroxine (SYNTHROID) 88 MCG tablet Take 1 tablet (88 mcg total) by mouth daily before breakfast. Skip sundays 90 tablet 3  . LORazepam (ATIVAN) 0.5 MG tablet Take 1 tablet (0.5 mg total) by mouth 2 (two) times daily as needed for anxiety or sleep. Or 1 mg at night for sleep (Patient taking differently: Take 0.5 mg by mouth 2 (two) times daily. ) 60 tablet 5  . mirabegron ER (MYRBETRIQ) 50 MG TB24 tablet Take 1 tablet (50 mg total) by mouth daily. (Patient taking differently: Take 50 mg by mouth at bedtime. ) 30 tablet 11  . montelukast (SINGULAIR) 10 MG tablet Take 1 tablet (10 mg total) by mouth at bedtime. 90 tablet 3  . naloxegol oxalate (MOVANTIK) 12.5 MG TABS tablet Take 1-2 tablets (12.5-25 mg total) by mouth daily. Constipation x 3 days prn 6 tablet 0  . omeprazole (PRILOSEC) 40 MG capsule Take 1 capsule (40 mg total) by mouth daily. (Patient taking differently: Take 40 mg by mouth daily after lunch. ) 90 capsule 3  . polyethylene glycol powder (GLYCOLAX/MIRALAX) 17 GM/SCOOP powder Take 17 g by mouth daily as needed for moderate constipation. (Patient not taking: Reported on 01/20/2020) 255 g 11  . potassium chloride (KLOR-CON) 10 MEQ tablet Take 1 tablet (10 mEq total) by mouth daily. 90 tablet 3  . Probiotic Product  (HEALTHY COLON PO) Take 1 capsule by mouth daily.    . sodium chloride (OCEAN) 0.65 % SOLN nasal spray Place 2 sprays into both nostrils as needed for congestion. Use 1st (Patient not taking: Reported on 01/20/2020) 1 Bottle 12  . tiZANidine (ZANAFLEX) 4 MG tablet Take 1 tablet (4  mg total) by mouth 2 (two) times daily as needed for muscle spasms. 60 tablet 5  . traZODone (DESYREL) 50 MG tablet Take 1-2 tablets (50-100 mg total) by mouth at bedtime as needed for sleep. 1 hr before bed (Patient taking differently: Take 100 mg by mouth at bedtime. 1 hr before bed) 60 tablet 11  . Turmeric Curcumin 500 MG CAPS Take 500 mg by mouth daily.    . Vaginal Lubricant (REPLENS) GEL Place 1 application vaginally 3 (three) times a week.    . vitamin B-12 (CYANOCOBALAMIN) 1000 MCG tablet Take 1 tablet (1,000 mcg total) by mouth daily. 90 tablet 3  . oxyCODONE-acetaminophen (PERCOCET/ROXICET) 5-325 MG tablet Take 0.5-1 tablets by mouth at bedtime.  (Patient not taking: Reported on 01/24/2020)      No results found for this or any previous visit (from the past 48 hour(s)). No results found.  Review of Systems General ROS: Negative Respiratory ROS: Negative Cardiovascular ROS: Negative Gastrointestinal ROS: Negative Genito-Urinary ROS: Negative Musculoskeletal ROS: Positive for back pain Neurological ROS: Positive for bilateral leg pain Dermatological ROS: Negative   Blood pressure (!) 166/107, pulse 93, temperature 97.7 F (36.5 C), temperature source Tympanic, resp. rate 17, height 5\' 3"  (1.6 m), weight 72.6 kg, SpO2 99 %. Physical Exam  General appearance: Alert, cooperative, in no acute distress CV: Regular rate and rhythm Pulm: Clear to auscultation  Neurologic exam:  Mental status: alertness: alert, affect: normal Speech: fluent and clear Motor:strength symmetric 5/5 in bilateral lower extremities Sensory: intact to light touch in bilateral lower extremities Gait: Antalgic gait, using walker     MRI thoracic spine: Diffuse degenerative disc disease throughout the thoracic spine without neural impingement. There is no spinal stenosis. No significant foraminal stenosis. No  significant facet arthritis in the thoracic spine.    Assessment/Plan We will proceed with thoracic SCS trial via laminectomy  , MD 01/24/2020, 11:02 AM

## 2020-01-24 NOTE — Interval H&P Note (Signed)
History and Physical Interval Note:  01/24/2020 11:04 AM  Kimberly Cook  has presented today for surgery, with the diagnosis of chronic pain g89.4.  The various methods of treatment have been discussed with the patient and family. After consideration of risks, benefits and other options for treatment, the patient has consented to  Procedure(s): THORACIC SPINAL CORD STIMULATOR PADDLE TRIAL VIA LAMINECTOMY (N/A) as a surgical intervention.  The patient's history has been reviewed, patient examined, no change in status, stable for surgery.  I have reviewed the patient's chart and labs.  Questions were answered to the patient's satisfaction.     Lucy Chris

## 2020-01-24 NOTE — Transfer of Care (Signed)
Immediate Anesthesia Transfer of Care Note  Patient: Kimberly Cook  Procedure(s) Performed: THORACIC SPINAL CORD STIMULATOR PADDLE TRIAL VIA LAMINECTOMY (N/A Back)  Patient Location: PACU  Anesthesia Type:General  Level of Consciousness: awake, alert  and patient cooperative  Airway & Oxygen Therapy: Patient Spontanous Breathing and Patient connected to face mask oxygen  Post-op Assessment: Report given to RN and Post -op Vital signs reviewed and stable  Post vital signs: Reviewed and stable  Last Vitals:  Vitals Value Taken Time  BP 131/100 01/24/20 1509  Temp    Pulse 80 01/24/20 1517  Resp 15 01/24/20 1517  SpO2 100 % 01/24/20 1517  Vitals shown include unvalidated device data.  Last Pain:  Vitals:   01/24/20 1017  TempSrc: Tympanic  PainSc: 7          Complications: No complications documented.

## 2020-01-24 NOTE — Op Note (Signed)
Operative Note   SURGERY DATE:  01/24/2020   PRE-OP DIAGNOSIS:  Chronic pain syndrome   POST-OP DIAGNOSIS: Post-Op Diagnosis Codes: Chronic pain syndrome   Procedure(s) with comments: Thoracic spinal cord stimulator trial placement via laminectomy   SURGEON:     * Malen Gauze, MD       Lonell Face - assistant     ANESTHESIA: GETA   OPERATIVE FINDINGS: Successful placement of thoracic spinal cord stimulator paddle trial with externalization of wire   Indication Kimberly Cook was seen in clinic on 5/18 with ongoing back and left leg pain.  MRI of the spine revealed no concerning stenosis at the level of the implant.  Given her severe scoliosis, we did discuss a paddle placement via laminectomy. Risks including weakness, hematoma, infection, failure of pain relief, post-operative pain,  stroke, heart attack, pneumonia, and spinal cord injury were discussed.  We discussed using neuro monitoring to prevent any neurologic deficits.  She wanted to proceed with the trial.     Procedure The patient was brought to the operating room where vascular access was obtained and she was intubated by the anesthesia service.  Neuro monitoring electrodes were placed and baseline MEP's and SSEPs were normal.  She was placed prone on a Wilson frame. Antibiotics were given. Fluoroscopy was used to confirm planned incision in mid thoracic area.  In addition, an incision was planned in the left flank for the pulse generator placement.  The patient was prepped and draped in a sterile fashion. A hard time out was performed. Local anesthetic was instilled into planned incision sites.    The midline thoracic incision was opened and taken to the fascia using cautery.the spinous process and lamina of T9-10 were exposed.  Fluoroscopy confirmed the level.  The drill was used to remove the inferior portion of the T9 lamina and superior portion of T10.  The ligament was removed exposing the dura.  Hemostasis was  achieved.  The paddle was placed gently in the rostral direction ensuring no undue pressure on the dura.  Once the paddle was successfully placed in midline, the level was confirmed with fluoroscopy.  It was seen to be at the T7-8 disc level.  We used the programmer and monitoring to ensure these were covering the patients areas of pain in the left leg.   The leads were secured with anchors in the fascia.  The incision was irrigated and hemostasis obtained   Next, the left flank incision was opened and taken to the fascia and inferior dissection to allow for a large enough pocket for the placement of the battery.  The incision was irrigated and hemostasis obtained.  Next, a tunneler was used to pass the electrode leads from the lumbar incision to the left flank.  There, the electrodes were attached to extension leads.  The extension leads were then tunneled to the right flank to exit the skin.  There they were secured with nylon. A final fluoroscopic image was taken show good placement of the paddle.  Vancomycin powder was placed into the incisions.  Next, a combination of 0 and 2-0 Vicryls were used to close the incisions with 3-0 nylon on the skin.    Sterile dressings were applied. The patient was returned to supine position and the patient was seen to be moving all extremities symmetrically and was taken to PACU for recovery. The family was updated and all questions answered.    ESTIMATED BLOOD LOSS:   50 cc   SPECIMENS  None  IMPLANT   STIMULATOR CORD SURESCAN MRI - [id number]  Inventory Item: STIMULATOR CORD SURESCAN MRI Serial no.:  Model/Cat no.: [id number]  Implant name: STIMULATOR CORD SURESCAN MRI - [id number] Laterality: N/A Area: Spine Thoracic  Manufacturer: [employer] Date of Manufacture:    Action: Implanted Number Used: 1   Device Identifier:  Device Identifier Type:    KIT EXT SPINAL CORD STIMULATOR - [id number]  Inventory Item: KIT EXT SPINAL CORD STIMULATOR  Serial no.: [id number] Model/Cat no.: [id number]  Implant name: KIT EXT SPINAL CORD STIMULATOR - [id number] Laterality: N/A Area: Spine Thoracic  Manufacturer: [employer] Date of Manufacture:    Action: Implanted Number Used: 1   Device Identifier:  Device Identifier Type:    KIT EXT SPINAL CORD STIMULATOR - [id number]  Inventory Item: KIT EXT SPINAL CORD STIMULATOR Serial no.: [id number] Model/Cat no.: [id number]  Implant name: KIT EXT SPINAL CORD STIMULATOR - [id number] Laterality: N/A Area: Spine Thoracic  Manufacturer: [employer] NEUROMOD PAIN MGMT Date of Manufacture:    Action: Implanted Number Used: 1   Device Identifier:  Device Identifier Type:        I performed the case in its entirety with assistance of Lonell Face, NP   Kimberly Cook, [place]

## 2020-01-25 ENCOUNTER — Encounter: Payer: Self-pay | Admitting: Neurosurgery

## 2020-01-25 DIAGNOSIS — N183 Chronic kidney disease, stage 3 unspecified: Secondary | ICD-10-CM | POA: Diagnosis not present

## 2020-01-25 DIAGNOSIS — I2510 Atherosclerotic heart disease of native coronary artery without angina pectoris: Secondary | ICD-10-CM | POA: Diagnosis not present

## 2020-01-25 DIAGNOSIS — F329 Major depressive disorder, single episode, unspecified: Secondary | ICD-10-CM | POA: Diagnosis not present

## 2020-01-25 DIAGNOSIS — G894 Chronic pain syndrome: Secondary | ICD-10-CM | POA: Diagnosis not present

## 2020-01-25 DIAGNOSIS — F419 Anxiety disorder, unspecified: Secondary | ICD-10-CM | POA: Diagnosis not present

## 2020-01-25 DIAGNOSIS — E785 Hyperlipidemia, unspecified: Secondary | ICD-10-CM | POA: Diagnosis not present

## 2020-01-25 DIAGNOSIS — M1990 Unspecified osteoarthritis, unspecified site: Secondary | ICD-10-CM | POA: Diagnosis not present

## 2020-01-25 DIAGNOSIS — K219 Gastro-esophageal reflux disease without esophagitis: Secondary | ICD-10-CM | POA: Diagnosis not present

## 2020-01-25 DIAGNOSIS — I129 Hypertensive chronic kidney disease with stage 1 through stage 4 chronic kidney disease, or unspecified chronic kidney disease: Secondary | ICD-10-CM | POA: Diagnosis not present

## 2020-01-25 MED ORDER — CEPHALEXIN 500 MG PO CAPS: 500.0000 mg | ORAL_CAPSULE | Freq: Two times a day (BID) | ORAL | 0 refills | Status: DC

## 2020-01-25 MED ORDER — OXYCODONE HCL 5 MG PO TABS: 5.0000 mg | ORAL_TABLET | Freq: Four times a day (QID) | ORAL | 0 refills | Status: AC | PRN

## 2020-01-25 MED ORDER — CEFAZOLIN SODIUM-DEXTROSE 1-4 GM/50ML-% IV SOLN
1.0000 g | Freq: Three times a day (TID) | INTRAVENOUS | Status: DC
  Administered 2020-01-25: 1 g via INTRAVENOUS
  Filled 2020-01-25 (×2): qty 50

## 2020-01-25 MED ORDER — ACETAMINOPHEN 325 MG PO TABS: 650.0000 mg | ORAL_TABLET | ORAL | Status: DC | PRN

## 2020-01-25 NOTE — Discharge Instructions (Signed)
Please CONTINUE TO HOLD YOUR ASPIRIN UNTIL FURTHER NOTICE. ALSO PLEASE AVOID ANY ANTIINFLAMMATORY MEDICATIONS AS THESE CAN INCREASE YOUR RISK OF BLEEDING.  We have prescribed Keflex for you, please take this until your surgery next week. Please make sure you are drinking plenty of fluids.   DO NOT REMOVE THE DRESSINGS ON YOUR INCISIONS.  DO NOT SHOWER.   Your surgeon has performed an operation on your lumbar spine (low back) to relieve pressure on one or more nerves. Many times, patients feel better immediately after surgery and can overdo it. Even if you feel well, it is important that you follow these activity guidelines. If you do not let your back heal properly from the surgery, you can increase the chance of a disc herniation and/or return of your symptoms. The following are instructions to help in your recovery once you have been discharged from the hospital.   Activity    No bending, lifting, or twisting (BLT). Avoid lifting objects heavier than 10 pounds (gallon milk jug).  Where possible, avoid household activities that involve lifting, bending, pushing, or pulling such as laundry, vacuuming, grocery shopping, and childcare. Try to arrange for help from friends and family for these activities while your back heals.  Increase physical activity slowly as tolerated.  Taking short walks is encouraged, but avoid strenuous exercise. Do not jog, run, bicycle, lift weights, or participate in any other exercises unless specifically allowed by your doctor. Avoid prolonged sitting, including car rides.  Talk to your doctor before resuming sexual activity.  You should not drive until cleared by your doctor.  Until released by your doctor, you should not return to work or school.  You should rest at home and let your body heal.   You may shower two days after your surgery.  After showering, lightly dab your incision dry. Do not take a tub bath or go swimming for 3 weeks, or until approved by  your doctor at your follow-up appointment.  If you smoke, we strongly recommend that you quit.  Smoking has been proven to interfere with normal healing in your back and will dramatically reduce the success rate of your surgery. Please contact [facility] (800-QUIT-NOW) and use the resources at www.[facility].com for assistance in stopping smoking.  Surgical Incision   If you have a dressing on your incision, you may remove it three days after your surgery. Keep your incision area clean and dry.  If you have staples or stitches on your incision, you should have a follow up scheduled for removal. If you do not have staples or stitches, you will have steri-strips (small pieces of surgical tape) or Dermabond glue. The steri-strips/glue should begin to peel away within about a week (it is fine if the steri-strips fall off before then). If the strips are still in place one week after your surgery, you may gently remove them.  Diet            You may return to your usual diet. Be sure to stay hydrated.  When to Contact us  Although your surgery and recovery will likely be uneventful, you may have some residual numbness, aches, and pains in your back and/or legs. This is normal and should improve in the next few weeks.  However, should you experience any of the following, contact us immediately:  New numbness or weakness  Pain that is progressively getting worse, and is not relieved by your pain medications or rest  Bleeding, redness, swelling, pain, or drainage from surgical  incision  Chills or flu-like symptoms  Fever greater than 101.0 F (38.3 C)  Problems with bowel or bladder functions  Difficulty breathing or shortness of breath  Warmth, tenderness, or swelling in your calf  Contact Information  During office hours (Monday-Friday 9 am to 5 pm), please call your physician at [phone]  After hours and weekends, please call [phone] and an answering service will put you in  touch with either Dr. Lacinda Axon or Dr. Izora Ribas.   For a life-threatening emergency, call 911

## 2020-01-25 NOTE — Discharge Summary (Signed)
Procedure: Thoracic SCS paddle trial Procedure date: 01/24/2020 Diagnosis: Chronic pain syndrome   History: Kimberly Cook is s/p thoracic SCS paddle lead trial POD1: Pt was seen this morning, ambulating, urinating. Pain well controlled.   Physical Exam: Vitals:   01/25/20 0044 01/25/20 0824  BP: (!) 147/76 (!) 153/85  Pulse: 84 82  Resp: 16 16  Temp: 97.8 F (36.6 C) 98 F (36.7 C)  SpO2: 97% 97%    General: Alert and oriented, sitting in bed Strength:5/5 throughout  Sensation: intact and symmetric throughout  Skin: dressings over thoracic spine and right and left flank in place. Left flank dressing changed.   Data:  Recent Labs  Lab 01/21/20 0941  NA 140  K 3.8  CL 106  CO2 26  BUN 17  CREATININE 1.05*  GLUCOSE 107*  CALCIUM 9.2   No results for input(s): AST, ALT, ALKPHOS in the last 168 hours.  Invalid input(s): TBILI   Recent Labs  Lab 01/21/20 0941  WBC 4.9  HGB 13.5  HCT 37.6  PLT 147*   Recent Labs  Lab 01/21/20 0941  APTT 29  INR 1.0           Assessment/Plan:  Kimberly Cook is POD1 s/p SCS paddle lead trial.  She is appropriate for discharge to home today.  She will continue to hold her aspirin until after her permanent implantation. She was given a prescription for Keflex to continue until after implantation.  She should keep dressings in place until seen back at [facility]. She should not shower.    Kimberly Berthold, NP Department of Neurosurgery

## 2020-01-26 ENCOUNTER — Encounter: Payer: Self-pay | Admitting: Neurosurgery

## 2020-01-26 ENCOUNTER — Encounter: Payer: Self-pay | Admitting: Internal Medicine

## 2020-01-27 ENCOUNTER — Other Ambulatory Visit
Admission: RE | Admit: 2020-01-27 | Discharge: 2020-01-27 | Disposition: A | Discharge status: Home / Self Care | Payer: Medicare HMO | Source: Ambulatory Visit | Attending: Neurosurgery | Admitting: Neurosurgery

## 2020-01-27 ENCOUNTER — Encounter: Payer: Self-pay | Admitting: Urgent Care

## 2020-01-27 ENCOUNTER — Other Ambulatory Visit: Payer: Self-pay

## 2020-01-27 DIAGNOSIS — G894 Chronic pain syndrome: Secondary | ICD-10-CM | POA: Diagnosis not present

## 2020-01-27 DIAGNOSIS — Z01812 Encounter for preprocedural laboratory examination: Secondary | ICD-10-CM | POA: Insufficient documentation

## 2020-01-27 DIAGNOSIS — Z20822 Contact with and (suspected) exposure to covid-19: Secondary | ICD-10-CM | POA: Insufficient documentation

## 2020-01-27 LAB — SARS CORONAVIRUS 2 (TAT 6-24 HRS): SARS Coronavirus 2: NEGATIVE

## 2020-01-31 ENCOUNTER — Ambulatory Visit: Payer: Medicare HMO | Admitting: Anesthesiology

## 2020-01-31 ENCOUNTER — Encounter
Admission: RE | Disposition: A | Discharge status: Home / Self Care | Payer: Self-pay | Source: Home / Self Care | Attending: Neurosurgery

## 2020-01-31 ENCOUNTER — Encounter: Payer: Self-pay | Admitting: Neurosurgery

## 2020-01-31 ENCOUNTER — Ambulatory Visit: Payer: Medicare HMO

## 2020-01-31 ENCOUNTER — Ambulatory Visit
Admission: RE | Admit: 2020-01-31 | Discharge: 2020-01-31 | Disposition: A | Discharge status: Home / Self Care | Payer: Medicare HMO | Attending: Neurosurgery | Admitting: Neurosurgery

## 2020-01-31 ENCOUNTER — Other Ambulatory Visit: Payer: Self-pay

## 2020-01-31 DIAGNOSIS — Z96652 Presence of left artificial knee joint: Secondary | ICD-10-CM | POA: Insufficient documentation

## 2020-01-31 DIAGNOSIS — G894 Chronic pain syndrome: Secondary | ICD-10-CM | POA: Insufficient documentation

## 2020-01-31 DIAGNOSIS — F329 Major depressive disorder, single episode, unspecified: Secondary | ICD-10-CM | POA: Diagnosis not present

## 2020-01-31 DIAGNOSIS — K219 Gastro-esophageal reflux disease without esophagitis: Secondary | ICD-10-CM | POA: Insufficient documentation

## 2020-01-31 DIAGNOSIS — E782 Mixed hyperlipidemia: Secondary | ICD-10-CM | POA: Diagnosis not present

## 2020-01-31 DIAGNOSIS — E785 Hyperlipidemia, unspecified: Secondary | ICD-10-CM | POA: Diagnosis not present

## 2020-01-31 DIAGNOSIS — Z7983 Long term (current) use of bisphosphonates: Secondary | ICD-10-CM | POA: Diagnosis not present

## 2020-01-31 DIAGNOSIS — I2510 Atherosclerotic heart disease of native coronary artery without angina pectoris: Secondary | ICD-10-CM | POA: Insufficient documentation

## 2020-01-31 DIAGNOSIS — Z8673 Personal history of transient ischemic attack (TIA), and cerebral infarction without residual deficits: Secondary | ICD-10-CM | POA: Insufficient documentation

## 2020-01-31 DIAGNOSIS — I739 Peripheral vascular disease, unspecified: Secondary | ICD-10-CM | POA: Diagnosis not present

## 2020-01-31 DIAGNOSIS — E039 Hypothyroidism, unspecified: Secondary | ICD-10-CM | POA: Diagnosis not present

## 2020-01-31 DIAGNOSIS — Z7722 Contact with and (suspected) exposure to environmental tobacco smoke (acute) (chronic): Secondary | ICD-10-CM | POA: Insufficient documentation

## 2020-01-31 DIAGNOSIS — M1990 Unspecified osteoarthritis, unspecified site: Secondary | ICD-10-CM | POA: Insufficient documentation

## 2020-01-31 DIAGNOSIS — I129 Hypertensive chronic kidney disease with stage 1 through stage 4 chronic kidney disease, or unspecified chronic kidney disease: Secondary | ICD-10-CM | POA: Insufficient documentation

## 2020-01-31 DIAGNOSIS — N183 Chronic kidney disease, stage 3 unspecified: Secondary | ICD-10-CM | POA: Insufficient documentation

## 2020-01-31 DIAGNOSIS — Z79890 Hormone replacement therapy (postmenopausal): Secondary | ICD-10-CM | POA: Diagnosis not present

## 2020-01-31 DIAGNOSIS — M5136 Other intervertebral disc degeneration, lumbar region: Secondary | ICD-10-CM | POA: Diagnosis not present

## 2020-01-31 DIAGNOSIS — Z8249 Family history of ischemic heart disease and other diseases of the circulatory system: Secondary | ICD-10-CM | POA: Insufficient documentation

## 2020-01-31 DIAGNOSIS — Z79899 Other long term (current) drug therapy: Secondary | ICD-10-CM | POA: Insufficient documentation

## 2020-01-31 DIAGNOSIS — E559 Vitamin D deficiency, unspecified: Secondary | ICD-10-CM | POA: Insufficient documentation

## 2020-01-31 DIAGNOSIS — F419 Anxiety disorder, unspecified: Secondary | ICD-10-CM | POA: Insufficient documentation

## 2020-01-31 DIAGNOSIS — Z419 Encounter for procedure for purposes other than remedying health state, unspecified: Secondary | ICD-10-CM

## 2020-01-31 HISTORY — PX: PULSE GENERATOR IMPLANT: SHX5370

## 2020-01-31 SURGERY — UNILATERAL PULSE GENERATOR IMPLANT
Anesthesia: General | Laterality: Left

## 2020-01-31 MED ORDER — BUPIVACAINE-EPINEPHRINE (PF) 0.5% -1:200000 IJ SOLN
INTRAMUSCULAR | Status: DC | PRN
  Administered 2020-01-31: 20 mL

## 2020-01-31 MED ORDER — PROPOFOL 500 MG/50ML IV EMUL
INTRAVENOUS | Status: AC
  Filled 2020-01-31: qty 50

## 2020-01-31 MED ORDER — MIDAZOLAM HCL 2 MG/2ML IJ SOLN
INTRAMUSCULAR | Status: AC
  Filled 2020-01-31: qty 2

## 2020-01-31 MED ORDER — MIDAZOLAM HCL 2 MG/2ML IJ SOLN
INTRAMUSCULAR | Status: DC | PRN
  Administered 2020-01-31: .5 mg via INTRAVENOUS
  Administered 2020-01-31: 1 mg via INTRAVENOUS
  Administered 2020-01-31: .5 mg via INTRAVENOUS

## 2020-01-31 MED ORDER — PROPOFOL 10 MG/ML IV BOLUS
INTRAVENOUS | Status: DC | PRN
  Administered 2020-01-31: 50 mg via INTRAVENOUS

## 2020-01-31 MED ORDER — FENTANYL CITRATE (PF) 100 MCG/2ML IJ SOLN
25.0000 ug | INTRAMUSCULAR | Status: DC | PRN
  Administered 2020-01-31 (×4): 25 ug via INTRAVENOUS

## 2020-01-31 MED ORDER — CEFAZOLIN SODIUM-DEXTROSE 2-4 GM/100ML-% IV SOLN
INTRAVENOUS | Status: AC
  Filled 2020-01-31: qty 100

## 2020-01-31 MED ORDER — VANCOMYCIN HCL 1 G IV SOLR
INTRAVENOUS | Status: DC | PRN
  Administered 2020-01-31: 1000 mg via TOPICAL

## 2020-01-31 MED ORDER — CHLORHEXIDINE GLUCONATE 0.12 % MT SOLN
OROMUCOSAL | Status: AC
  Administered 2020-01-31: 15 mL via OROMUCOSAL
  Filled 2020-01-31: qty 15

## 2020-01-31 MED ORDER — DEXTROSE 5 % IV SOLN: 2000.0000 mg | Freq: Once | INTRAVENOUS | Status: DC

## 2020-01-31 MED ORDER — ONDANSETRON HCL 4 MG/2ML IJ SOLN: 4.0000 mg | Freq: Once | INTRAMUSCULAR | Status: DC | PRN

## 2020-01-31 MED ORDER — ORAL CARE MOUTH RINSE: 15.0000 mL | Freq: Once | OROMUCOSAL | Status: AC

## 2020-01-31 MED ORDER — LIDOCAINE HCL (PF) 2 % IJ SOLN
INTRAMUSCULAR | Status: AC
  Filled 2020-01-31: qty 5

## 2020-01-31 MED ORDER — OXYCODONE HCL 5 MG PO TABS: 5.0000 mg | ORAL_TABLET | Freq: Four times a day (QID) | ORAL | 0 refills | Status: AC | PRN

## 2020-01-31 MED ORDER — CEFAZOLIN SODIUM-DEXTROSE 2-4 GM/100ML-% IV SOLN
2.0000 g | Freq: Once | INTRAVENOUS | Status: AC
  Administered 2020-01-31: 2 g via INTRAVENOUS

## 2020-01-31 MED ORDER — CHLORHEXIDINE GLUCONATE 0.12 % MT SOLN: 15.0000 mL | Freq: Once | OROMUCOSAL | Status: AC

## 2020-01-31 MED ORDER — PROPOFOL 500 MG/50ML IV EMUL
INTRAVENOUS | Status: DC | PRN
  Administered 2020-01-31: 100 ug/kg/min via INTRAVENOUS

## 2020-01-31 MED ORDER — LACTATED RINGERS IV SOLN
INTRAVENOUS | Status: DC
  Administered 2020-01-31: 10 mL/h via INTRAVENOUS

## 2020-01-31 MED ORDER — FENTANYL CITRATE (PF) 100 MCG/2ML IJ SOLN
INTRAMUSCULAR | Status: AC
  Filled 2020-01-31: qty 2

## 2020-01-31 SURGICAL SUPPLY — 51 items
BUR NEURO DRILL SOFT 3.0X3.8M (BURR) ×2 IMPLANT
CANISTER SUCT 1200ML W/VALVE (MISCELLANEOUS) ×2 IMPLANT
CHLORAPREP W/TINT 26 (MISCELLANEOUS) ×4 IMPLANT
COUNTER NEEDLE 20/40 LG (NEEDLE) ×2 IMPLANT
COVER LIGHT HANDLE STERIS (MISCELLANEOUS) ×4 IMPLANT
COVER WAND RF STERILE (DRAPES) ×2 IMPLANT
CUP MEDICINE 2OZ PLAST GRAD ST (MISCELLANEOUS) ×2 IMPLANT
DERMABOND ADVANCED (GAUZE/BANDAGES/DRESSINGS)
DERMABOND ADVANCED .7 DNX12 (GAUZE/BANDAGES/DRESSINGS) IMPLANT
DEVICE IMPLANT NEUROSTIMULATOR (Neuro Prosthesis/Implant) ×2 IMPLANT
DRAPE C-ARMOR (DRAPES) IMPLANT
DRAPE INCISE IOBAN 66X45 STRL (DRAPES) ×2 IMPLANT
DRAPE LAPAROTOMY 77X122 PED (DRAPES) IMPLANT
DRAPE SURG 17X11 SM STRL (DRAPES) ×2 IMPLANT
DRSG TEGADERM 4X4.75 (GAUZE/BANDAGES/DRESSINGS) IMPLANT
DRSG TELFA 4X3 1S NADH ST (GAUZE/BANDAGES/DRESSINGS) IMPLANT
ELECT CAUTERY BLADE TIP 2.5 (TIP) ×2
ELECT REM PT RETURN 9FT ADLT (ELECTROSURGICAL) ×2
ELECTRODE CAUTERY BLDE TIP 2.5 (TIP) ×1 IMPLANT
ELECTRODE REM PT RTRN 9FT ADLT (ELECTROSURGICAL) ×1 IMPLANT
ENVELOPE ABSORB ANTIBACTERIAL (Neuro Prosthesis/Implant) ×2 IMPLANT
FEE INTRAOP MONITOR IMPULS NCS (MISCELLANEOUS) IMPLANT
GAUZE SPONGE 4X4 12PLY STRL (GAUZE/BANDAGES/DRESSINGS) ×4 IMPLANT
GLOVE BIOGEL PI IND STRL 7.0 (GLOVE) ×1 IMPLANT
GLOVE BIOGEL PI INDICATOR 7.0 (GLOVE) ×1
GLOVE INDICATOR 8.0 STRL GRN (GLOVE) ×2 IMPLANT
GLOVE SURG SYN 7.0 (GLOVE) ×4 IMPLANT
GLOVE SURG SYN 8.0 (GLOVE) ×4 IMPLANT
GOWN STRL REUS W/ TWL LRG LVL3 (GOWN DISPOSABLE) ×1 IMPLANT
GOWN STRL REUS W/ TWL XL LVL3 (GOWN DISPOSABLE) ×2 IMPLANT
GOWN STRL REUS W/TWL LRG LVL3 (GOWN DISPOSABLE) ×2
GOWN STRL REUS W/TWL XL LVL3 (GOWN DISPOSABLE) ×4
GRADUATE 1200CC STRL 31836 (MISCELLANEOUS) ×2 IMPLANT
INTRAOP MONITOR FEE IMPULS NCS (MISCELLANEOUS)
INTRAOP MONITOR FEE IMPULSE (MISCELLANEOUS)
KIT TURNOVER KIT A (KITS) ×2 IMPLANT
KIT WILSON FRAME (KITS) ×2 IMPLANT
MARKER SKIN DUAL TIP RULER LAB (MISCELLANEOUS) ×2 IMPLANT
NEEDLE HYPO 22GX1.5 SAFETY (NEEDLE) ×2 IMPLANT
NS IRRIG 1000ML POUR BTL (IV SOLUTION) ×2 IMPLANT
PACK LAMINECTOMY NEURO (CUSTOM PROCEDURE TRAY) ×2 IMPLANT
PAD ARMBOARD 7.5X6 YLW CONV (MISCELLANEOUS) ×2 IMPLANT
RECHARGER INTELLIS (NEUROSURGERY SUPPLIES) ×2 IMPLANT
REPROGRAMMER INTELLIS (NEUROSURGERY SUPPLIES) ×2 IMPLANT
SUT ETHILON 3-0 FS-10 30 BLK (SUTURE)
SUT POLYSORB 2-0 5X18 GS-10 (SUTURE) ×6 IMPLANT
SUT SILK 2 0 PERMA HAND 18 BK (SUTURE) IMPLANT
SUT SILK 2 0 SH (SUTURE) ×6 IMPLANT
SUTURE EHLN 3-0 FS-10 30 BLK (SUTURE) IMPLANT
SYR BULB IRRIG 60ML STRL (SYRINGE) ×2 IMPLANT
TOWEL OR 17X26 4PK STRL BLUE (TOWEL DISPOSABLE) ×4 IMPLANT

## 2020-01-31 NOTE — OR Nursing (Signed)
Called Dr. Adriana Simas and advised consent states left side and he marked right. He came back and marked left side.

## 2020-01-31 NOTE — Anesthesia Preprocedure Evaluation (Addendum)
Anesthesia Evaluation  Patient identified by MRN, date of birth, ID band Patient awake    Reviewed: Allergy & Precautions, NPO status , Patient's Chart, lab work & pertinent test results  History of Anesthesia Complications Negative for: history of anesthetic complications  Airway Mallampati: II  TM Distance: >3 FB Neck ROM: Full    Dental no notable dental hx. (+) Lower Dentures, Upper Dentures   Pulmonary neg pulmonary ROS, neg shortness of breath, neg sleep apnea, neg COPD, Patient abstained from smoking.Not current smoker,    Pulmonary exam normal breath sounds clear to auscultation       Cardiovascular Exercise Tolerance: Good METShypertension, Pt. on medications + Peripheral Vascular Disease  (-) CAD and (-) Past MI (-) dysrhythmias  Rhythm:Regular Rate:Normal - Systolic murmurs    Neuro/Psych  Headaches, PSYCHIATRIC DISORDERS Anxiety Depression TIA Neuromuscular disease    GI/Hepatic Neg liver ROS, hiatal hernia, GERD  Medicated,  Endo/Other  neg diabetesHypothyroidism   Renal/GU Renal InsufficiencyRenal disease  negative genitourinary   Musculoskeletal  (+) Arthritis , Osteoarthritis,    Abdominal   Peds negative pediatric ROS (+)  Hematology negative hematology ROS (+)   Anesthesia Other Findings Past Medical History: No date: Allergy No date: Anxiety No date: Arthritis No date: Back pain No date: Chronic kidney disease     Comment:  STAGE 3 04/2017: Coronary artery disease     Comment:  Mild to moderate CAD in LAD/diagonal by CTA (CT-FFR of               apical LAD 0.79). No date: DDD (degenerative disc disease), lumbar No date: Depression No date: Diverticulosis No date: Essential hypertension     Comment:  Normal cardiolite 05/2006 EF 71% No date: GERD (gastroesophageal reflux disease) No date: Headache No date: History of shingles No date: Hyperlipidemia No date: Hypothyroidism No date:  Lung nodule 2011 : Mini stroke (HCC) No date: Occipital neuralgia No date: Palpitations 2018: Pneumonia No date: Prediabetes No date: Stroke [facility]) No date: Stroke [facility])     Comment:  MRI 04/2008 + left sup. frontal gyrus possibly puntate               infarct  2019: Syncope No date: Urinary tract infection No date: Vitamin D deficiency  Reproductive/Obstetrics                             Lab Results  Component Value Date   WBC 4.9 01/21/2020   HGB 13.5 01/21/2020   HCT 37.6 01/21/2020   MCV 90.0 01/21/2020   PLT 147 (L) 01/21/2020   Lab Results  Component Value Date   CREATININE 1.05 (H) 01/21/2020   BUN 17 01/21/2020   NA 140 01/21/2020   K 3.8 01/21/2020   CL 106 01/21/2020   CO2 26 01/21/2020   Lab Results  Component Value Date   INR 1.0 01/21/2020   INR 1.06 04/06/2015   INR 1.01 12/26/2009    Anesthesia Physical  Anesthesia Plan  ASA: III  Anesthesia Plan: General   Post-op Pain Management:    Induction: Intravenous  PONV Risk Score and Plan: 4 or greater and Ondansetron, Dexamethasone and TIVA  Airway Management Planned: Nasal Cannula  Additional Equipment: None  Intra-op Plan:   Post-operative Plan:   Informed Consent: I have reviewed the patients History and Physical, chart, labs and discussed the procedure including the risks, benefits and alternatives for the proposed anesthesia with the patient  or authorized representative who has indicated his/her understanding and acceptance.     Dental advisory given  Plan Discussed with: CRNA  Anesthesia Plan Comments: (Discussed risks of anesthesia with patient, including PONV, sore throat, lip/dental damage. Rare risks discussed as well, such as cardiorespiratory and neurological sequelae. Patient understands.)       Anesthesia Quick Evaluation

## 2020-01-31 NOTE — Transfer of Care (Signed)
Immediate Anesthesia Transfer of Care Note  Patient: Kimberly Cook  Procedure(s) Performed: PLACEMENT RIGHT FLANK PULSE GENERATOR VS REMOVAL SPINAL CORD STIMULATOR (Left )  Patient Location: PACU  Anesthesia Type:General  Level of Consciousness: drowsy and patient cooperative  Airway & Oxygen Therapy: Patient Spontanous Breathing and Patient connected to nasal cannula oxygen  Post-op Assessment: Report given to RN and Post -op Vital signs reviewed and stable  Post vital signs: Reviewed and stable  Last Vitals:  Vitals Value Taken Time  BP    Temp    Pulse    Resp    SpO2      Last Pain:  Vitals:   01/31/20 0608  TempSrc: Tympanic  PainSc: 0-No pain         Complications: No complications documented.

## 2020-01-31 NOTE — Anesthesia Postprocedure Evaluation (Signed)
Anesthesia Post Note  Patient: Kimberly Cook  Procedure(s) Performed: PLACEMENT RIGHT FLANK PULSE GENERATOR VS REMOVAL SPINAL CORD STIMULATOR (Left )  Patient location during evaluation: PACU Anesthesia Type: General Level of consciousness: awake and alert and oriented Pain management: pain level controlled Vital Signs Assessment: post-procedure vital signs reviewed and stable Respiratory status: spontaneous breathing Cardiovascular status: blood pressure returned to baseline Anesthetic complications: no   No complications documented.   Last Vitals:  Vitals:   01/31/20 0909 01/31/20 0959  BP: (!) 131/99 (!) 146/91  Pulse: 71 85  Resp: 16 16  Temp: 36.6 C 36.6 C  SpO2: 99% 100%    Last Pain:  Vitals:   01/31/20 0959  TempSrc: Temporal  PainSc: 0-No pain                 Devante Capano

## 2020-01-31 NOTE — Discharge Summary (Signed)
Procedure: permanent placement of thoracic SCS and IPG Procedure date: 01/31/2020 Diagnosis: chronic pain   History: Kimberly Cook is s/p permanent implantation of thoracic SCS and IPG POD0: Tolerated procedure well. Evaluated in post op recovery still disoriented from anesthesia but able to answer questions and obey commands.   Physical Exam: Vitals:   01/31/20 0909 01/31/20 0959  BP: (!) 131/99 (!) 146/91  Pulse: 71 85  Resp: 16 16  Temp: 97.9 F (36.6 C) 97.8 F (36.6 C)  SpO2: 99% 100%    General: Alert and oriented, lying in bed Strength:5/5 throughout  Sensation: intact and symmetric throughout  Skin: incisions to midline and left flank are clean, dry, intact covered in sterile dressings  Data:  No results for input(s): NA, K, CL, CO2, BUN, CREATININE, LABGLOM, GLUCOSE, CALCIUM in the last 168 hours. No results for input(s): AST, ALT, ALKPHOS in the last 168 hours.  Invalid input(s): TBILI   No results for input(s): WBC, HGB, HCT, PLT in the last 168 hours. No results for input(s): APTT, INR in the last 168 hours.        Assessment/Plan:  Kimberly Cook is POD0 s/p permanent placement of thoracic SCS and IPG.   Once patient able to ambulate, tolerate some PO, and urinate, she is cleared for discharge.  She will continue to hold her ASA x 7 days and finish her Keflex course.   Patsey Berthold, NP  Department of Neurosurgery

## 2020-01-31 NOTE — Interval H&P Note (Signed)
History and Physical Interval Note:  01/31/2020 6:39 AM  Kimberly Cook  has presented today for surgery, with the diagnosis of chronic pain g89.4.  The various methods of treatment have been discussed with the patient and family. After consideration of risks, benefits and other options for treatment, the patient has consented to  Procedure(s): PLACEMENT RIGHT FLANK PULSE GENERATOR VS REMOVAL SPINAL CORD STIMULATOR (Right) as a surgical intervention.  The patient's history has been reviewed, patient examined, no change in status, stable for surgery.  I have reviewed the patient's chart and labs.  Questions were answered to the patient's satisfaction.     Lucy Chris

## 2020-01-31 NOTE — H&P (Signed)
Kimberly Cook is an 74 y.o. female.   Chief Complaint: Back and leg pain HPI: Kimberly Cook is here for evaluation of ongoing back and leg pain. She was last seen by me in 2020 and was evaluated for possible scoliosis surgery. She is increased her bone quality but after discussion with my partner regarding the risk versus benefits of a large decompression and fusion, she is here to discuss a possible spinal cord stimulator placement for pain. She states that she has pain going from the back into the legs which limits her activity. This does get relieved with sitting and avoiding activity. She does not endorse any obvious numbness in her legs. However, she does feel like her legs will give out her times and she does equate most of this to the pain.   She had a thoracic SCS trial this past week and got great relief of her pain and is here to proceed with battery placement.   Past Medical History:  Diagnosis Date  . Allergy   . Anxiety   . Arthritis   . Back pain   . Chronic kidney disease    STAGE 3  . Coronary artery disease 04/2017   Mild to moderate CAD in LAD/diagonal by CTA (CT-FFR of apical LAD 0.79).  . DDD (degenerative disc disease), lumbar   . Depression   . Diverticulosis   . Essential hypertension    Normal cardiolite 05/2006 EF 71%  . GERD (gastroesophageal reflux disease)   . Headache   . History of shingles   . Hyperlipidemia   . Hypothyroidism   . Lung nodule   . Mini stroke (HCC) 2011   . Occipital neuralgia   . Palpitations   . Pneumonia 2018  . Prediabetes   . Stroke (HCC)   . Stroke [facility])    MRI 04/2008 + left sup. frontal gyrus possibly puntate infarct   . Syncope 2019  . Urinary tract infection   . Vitamin D deficiency     Past Surgical History:  Procedure Laterality Date  . ABDOMINAL HYSTERECTOMY    . BLADDER SURGERY     2003  . BREAST EXCISIONAL BIOPSY Right Over 20 years    Benign  . CHOLECYSTECTOMY    . gastroplication     . JOINT REPLACEMENT  Left    KNEE  . KNEE ARTHROSCOPY Left 2011  . THORACIC LAMINECTOMY FOR SPINAL CORD STIMULATOR N/A 01/24/2020   Procedure: THORACIC SPINAL CORD STIMULATOR PADDLE TRIAL VIA LAMINECTOMY;  Surgeon: Lucy Chris, MD;  Location: [facility] ORS;  Service: Neurosurgery;  Laterality: N/A;  . TONSILLECTOMY AND ADENOIDECTOMY    . TOTAL KNEE ARTHROPLASTY Left 04/17/2015   Procedure: LEFT TOTAL KNEE ARTHROPLASTY;  Surgeon: Durene Romans, MD;  Location: [facility] ORS;  Service: Orthopedics;  Laterality: Left;    Family History  Problem Relation Age of Onset  . Heart disease Mother   . Hypertension Mother   . Diabetes Mother   . Heart attack Mother 55  . Heart disease Father   . Heart attack Father 40  . Breast cancer Maternal Aunt   . Heart attack Brother    Social History:  reports that she is a non-smoker but has been exposed to tobacco smoke. She has never used smokeless tobacco. She reports that she does not drink alcohol and does not use drugs.  Allergies: No Known Allergies  Medications Prior to Admission  Medication Sig Dispense Refill  . acetaminophen (TYLENOL) 325 MG tablet Take 2 tablets (650 mg  total) by mouth every 4 (four) hours as needed for mild pain ((score 1 to 3) or temp > 100.5).    Marland Kitchen alendronate (FOSAMAX) 70 MG tablet Take 70 mg by mouth once a week. Fridays    . atorvastatin (LIPITOR) 20 MG tablet Take 1 tablet (20 mg total) by mouth daily. (Patient taking differently: Take 20 mg by mouth at bedtime. ) 90 tablet 3  . Biotin 1 MG CAPS Take 1 mg by mouth daily.     Marland Kitchen CALCIUM PO Take 1,200 mg by mouth daily.     . cephALEXin (KEFLEX) 500 MG capsule Take 1 capsule (500 mg total) by mouth 2 (two) times daily. 20 capsule 0  . cetirizine (ZYRTEC) 10 MG tablet Take 10 mg by mouth every morning.    . Cholecalciferol (VITAMIN D3) 125 MCG (5000 UT) TABS Take 1 tablet (5,000 Units total) by mouth daily. 90 tablet 3  . Cranberry 500 MG CAPS Take 500 mg by mouth daily.    Marland Kitchen diltiazem (CARDIZEM SR) 60 MG  12 hr capsule Take 1 capsule (60 mg total) by mouth 2 (two) times daily. 180 capsule 3  . escitalopram (LEXAPRO) 5 MG tablet Take 1 tablet (5 mg total) by mouth daily. (Patient taking differently: Take 5 mg by mouth every morning. ) 90 tablet 3  . fluticasone (FLONASE) 50 MCG/ACT nasal spray Place 2 sprays into both nostrils daily as needed for allergies or rhinitis. Use after nasal saline 16 g 12  . folic acid (FOLVITE) 400 MCG tablet Take 1 tablet (400 mcg total) by mouth daily. SEPARATE ALL SUPPLEMENTS TO LUNCH OR DINNER AND PRILOSEC NOT TO MESS W/THYROID MED 90 tablet 3  . gabapentin (NEURONTIN) 300 MG capsule 1 pill in the am and lunch and 2 pills qhs (Patient taking differently: Take 600 mg by mouth at bedtime. )    . hydrALAZINE (APRESOLINE) 10 MG tablet Take 2 tablets (20 mg total) by mouth 2 (two) times daily as needed. BP>140/>90 180 tablet 3  . hydrochlorothiazide (MICROZIDE) 12.5 MG capsule Take 1 capsule (12.5 mg total) by mouth daily. In am 90 capsule 3  . levothyroxine (SYNTHROID) 88 MCG tablet Take 1 tablet (88 mcg total) by mouth daily before breakfast. Skip sundays 90 tablet 3  . LORazepam (ATIVAN) 0.5 MG tablet Take 1 tablet (0.5 mg total) by mouth 2 (two) times daily as needed for anxiety or sleep. Or 1 mg at night for sleep (Patient taking differently: Take 0.5 mg by mouth 2 (two) times daily. ) 60 tablet 5  . mirabegron ER (MYRBETRIQ) 50 MG TB24 tablet Take 1 tablet (50 mg total) by mouth daily. (Patient taking differently: Take 50 mg by mouth at bedtime. ) 30 tablet 11  . montelukast (SINGULAIR) 10 MG tablet Take 1 tablet (10 mg total) by mouth at bedtime. 90 tablet 3  . naloxegol oxalate (MOVANTIK) 12.5 MG TABS tablet Take 1-2 tablets (12.5-25 mg total) by mouth daily. Constipation x 3 days prn 6 tablet 0  . omeprazole (PRILOSEC) 40 MG capsule Take 1 capsule (40 mg total) by mouth daily. (Patient taking differently: Take 40 mg by mouth daily after lunch. ) 90 capsule 3  .  polyethylene glycol powder (GLYCOLAX/MIRALAX) 17 GM/SCOOP powder Take 17 g by mouth daily as needed for moderate constipation. 255 g 11  . potassium chloride (KLOR-CON) 10 MEQ tablet Take 1 tablet (10 mEq total) by mouth daily. 90 tablet 3  . Probiotic Product (HEALTHY COLON PO) Take 1  capsule by mouth daily.    . sodium chloride (OCEAN) 0.65 % SOLN nasal spray Place 2 sprays into both nostrils as needed for congestion. Use 1st 1 Bottle 12  . tiZANidine (ZANAFLEX) 4 MG tablet Take 1 tablet (4 mg total) by mouth 2 (two) times daily as needed for muscle spasms. 60 tablet 5  . traZODone (DESYREL) 50 MG tablet Take 1-2 tablets (50-100 mg total) by mouth at bedtime as needed for sleep. 1 hr before bed (Patient taking differently: Take 100 mg by mouth at bedtime. 1 hr before bed) 60 tablet 11  . Turmeric Curcumin 500 MG CAPS Take 500 mg by mouth daily.    . Vaginal Lubricant (REPLENS) GEL Place 1 application vaginally 3 (three) times a week.    . vitamin B-12 (CYANOCOBALAMIN) 1000 MCG tablet Take 1 tablet (1,000 mcg total) by mouth daily. 90 tablet 3    No results found for this or any previous visit (from the past 48 hour(s)). No results found.  Review of Systems General ROS: Negative Respiratory ROS: Negative Cardiovascular ROS: Negative Gastrointestinal ROS: Negative Genito-Urinary ROS: Negative Musculoskeletal ROS: Positive for back pain Neurological ROS: Positive for bilateral leg pain Dermatological ROS: Negative   Blood pressure (!) 153/93, pulse 97, temperature (!) 97.3 F (36.3 C), temperature source Tympanic, resp. rate 18, height 5\' 3"  (1.6 m), weight 72.6 kg, SpO2 100 %. Physical Exam  General appearance: Alert, cooperative, in no acute distress CV: Regular rate and rhythm Pulm: Clear to auscultation Back: SCS leads with dressing, incisions approximated  Neurologic exam:  Mental status: alertness: alert, affect: normal Speech: fluent and clear Motor:strength symmetric 5/5 in  bilateral lower extremities Sensory: intact to light touch in bilateral lower extremities Gait: Antalgic gait, using walker  Assessment/Plan Plan is to proceed with battery placement  , MD 01/31/2020, 6:37 AM

## 2020-01-31 NOTE — Discharge Instructions (Addendum)
NEUROSURGERY DISCHARGE INSTRUCTIONS  The following are instructions to help in your recovery once you have been discharged from the hospital. Even if you feel well, it is important that you follow these activity guidelines.  What to do after you leave the hospital:  Recommended diet:  Increase protein intake to promote wound healing. You may return to your usual diet. However, you may experience discomfort when swallowing in the first month after your surgery. This is normal. You may find that softer foods are more comfortable for you to swallow. Be sure to stay hydrated.   Recommended activity: No bending, lifting, or twisting ("BLT"). Avoid lifting objects heavier than 10 pounds (gallon milk jug). Where possible, avoid household activities that involve lifting, bending, reaching, pushing, or pulling such as laundry, vacuuming, grocery shopping, and childcare. Try to arrange for help from friends and family for these activities while you heal.   Increase physical activity slowly as tolerated. Taking short walks is encouraged, but avoid strenuous exercise. Do not jog, run, bicycle, lift weights, or participate in any other exercises unless specifically allowed by your doctor.   You should not drive until cleared by your doctor.   Until released by your doctor, you should not return to work or school. You should rest at home and let your body heal.   You may shower the day after your surgery. After showering, lightly dab your incision dry. Do not take a tub bath or go swimming until approved by your doctor at your follow-up appointment.   If you smoke, we strongly recommend that you quit. Smoking has been proven to interfere with normal bone healing and will dramatically reduce the success rate of your surgery. Please contact [facility] (800-QUIT-NOW) and use the resources at www.[email or web address] for assistance in stopping smoking.   Medications  Do not restart Aspirin until seven days after  surgery (July 5). Finish the Keflex prescription.  * Do not take anti-inflammatory medications for 3 days after surgery (naproxen [Aleve], ibuprofen [Advil, Motrin], celecoxib [Celebrex], etc.).   You may restart home medications.   Wound Care Instructions  If you have a dressing on your incision, remove it two days after your surgery. Keep your incision area clean and dry.   If you have staples or stitches on your incision, you should have a follow up scheduled for removal. If you do not have staples or stitches, you will have steri-strips (small pieces of surgical tape) or Dermabond glue. The steri-strips/glue should begin to peel away within about a week (it is fine if the steri-strips fall off before then). If the strips are still in place one week after your surgery, you may gently remove them.    Please Report any of the following: Should you experience any of the following, contact us immediately:   New numbness or weakness   Pain that is progressively getting worse, and is not relieved by your pain medication, muscle relaxers, rest, and warm compresses   Bleeding, redness, swelling, pain, or drainage from surgical incision   Chills or flu-like symptoms   Fever greater than 101.0 F (38.3 C)   Inability to eat, drink fluids, or take medications   Problems with bowel or bladder functions   Difficulty breathing or shortness of breath   Warmth, tenderness, or swelling in your calf    Additional Follow up appointments During office hours (Monday-Friday 9 am to 5 pm), please call your physician at [phone] and ask for Sharlot Gowda.   After hours and weekends,  please call [phone] and an answering service will put you in touch with either Dr. Lacinda Axon or Dr. Izora Ribas.   For a life-threatening emergency, call [place]   1) The drugs that you were given will stay in your system until tomorrow so for the next 24 hours you  should not:  A) Drive an automobile B) Make any legal decisions C) Drink any alcoholic beverage   2) You may resume regular meals tomorrow.  Today it is better to start with liquids and gradually work up to solid foods.  You may eat anything you prefer, but it is better to start with liquids, then soup and crackers, and gradually work up to solid foods.   3) Please notify your doctor immediately if you have any unusual bleeding, trouble breathing, redness and pain at the surgery site, drainage, fever, or pain not relieved by medication.    4) Additional Instructions:        Please contact your physician with any problems or Same Day Surgery at [phone], Monday through Friday 6 am to 4 pm, or [place] at [facility] number at [phone].

## 2020-01-31 NOTE — Op Note (Signed)
Operative Note  SURGERY DATE:01/31/2020  PRE-OP DIAGNOSIS: Chronic Pain Syndrome(G89.4)  POST-OP DIAGNOSIS:Chronic Pain Syndrome(G89.4)  Procedure(s) with comments: Placement Left Flank Pulse Generator Removal Trial lead extension  SURGEON:  * Nathaniel Man, MD Patsey Berthold, NP, Assistant  ANESTHESIA:IV anesthetic, local  OPERATIVE FINDINGS:Successful Spinal Cord Stimulator Trial  Indications: Ms. Bucy underwent a SCS trial placement on 6/21 and she noted over the last week that she had almost complete relief of her back and leg pain. Given this, she wished to proceed with pulse generator placement. The risks including hematoma, infection, damage to spinal cord stimulator wires, failure of relief of pain, and need for revision surgery were discussed. The patient elected to proceed with the surgery  Procedure:  The patient was brought to the operating room and placed prone on a standard OR table. The anesthesia service had vascular access and provided sedation medications to the patient.  The left flank incision and right side exit site was prepped and draped in a sterile fashion.  A hard timeout was performed.  Local anesthetic was instilled into the incision. The sutures were removed.  The left flank incision was opened bluntly to expose the wires and all sutures removed. The leads were seen to be intact. The leads were removed from the extension connectors and then these were cut and the exiting leads removed from field. The stimulator leads were cleaned.    Hemostasis was obtained and the battery pocket was irrigated with saline.  Electrodes were attached to the pulse generator and interrogated. The impedences were found to be normal.  Electrodes were secured and tightened.  The battery was placed into the pocket in antibiotic pouch and secured with a 2-0 silk suture.  Vancomycin powder was placed into the pocket.  The deep tissue was closed with  2-0 Vicryl and subcutaneous with 2-0 Vicryl.  The skin was closed with 3-0 Nylon.  A dressing was applied. A 3-0 nylon suture was placed at the right exit site.  Patient was turned to the supine position and sedation medications were stopped and the patient was awoken from anesthesia.  Patient was taken to the PACU for further recovery and seen to be at neurologic baseline.  I discussed the results with the family        ESTIMATED BLOOD LOSS: 20cc   IMPLANT DEVICE Chesley Noon [id number]  Inventory Item: DEVICE IMPLANT NEUROSTIMINATOR Serial no.: [id number] Model/Cat no.: [id number]  Implant name: DEVICE Chesley Noon - [id number] Laterality: Left Area: Back   Manufacturer: [employer] NEUROMOD PAIN MGMT Date of Manufacture:    Action: Implanted Number Used: 1   Device Identifier:  Device Identifier Type:    ENVELOPE ABSORB ANTIBACTERIAL - [id number]  Inventory Item: ENVELOPE ABSORB ANTIBACTERIAL Serial no.:  Model/Cat no.: [id number]  Implant name: ENVELOPE ABSORB ANTIBACTERIAL - [id number] Laterality: Left Area: Back   Manufacturer: [employer] [place] [employer] Date of Manufacture:    Action: Implanted Number Used: 1   Device Identifier:  Device Identifier Type:        I performed the case in its entiretywith assistance of Patsey Berthold, NP  Lucy Chris, MD [phone]

## 2020-01-31 NOTE — OR Nursing (Signed)
Tolerating po's; ambulatory and voided.  Patient discharged to home with son.

## 2020-02-01 ENCOUNTER — Encounter: Payer: Self-pay | Admitting: Neurosurgery

## 2020-02-14 ENCOUNTER — Other Ambulatory Visit: Payer: Self-pay | Admitting: Internal Medicine

## 2020-02-14 DIAGNOSIS — J309 Allergic rhinitis, unspecified: Secondary | ICD-10-CM

## 2020-02-14 MED ORDER — MONTELUKAST SODIUM 10 MG PO TABS: 10.0000 mg | ORAL_TABLET | Freq: Every day | ORAL | 3 refills | Status: DC

## 2020-02-28 ENCOUNTER — Other Ambulatory Visit: Payer: Self-pay | Admitting: Internal Medicine

## 2020-02-28 DIAGNOSIS — G4700 Insomnia, unspecified: Secondary | ICD-10-CM

## 2020-02-28 DIAGNOSIS — F419 Anxiety disorder, unspecified: Secondary | ICD-10-CM

## 2020-02-28 MED ORDER — LORAZEPAM 0.5 MG PO TABS: 0.5000 mg | ORAL_TABLET | Freq: Two times a day (BID) | ORAL | 5 refills | Status: DC | PRN

## 2020-02-28 NOTE — Telephone Encounter (Signed)
Pended for your approval or denial.  ° °

## 2020-02-29 ENCOUNTER — Other Ambulatory Visit: Payer: Self-pay | Admitting: Internal Medicine

## 2020-02-29 DIAGNOSIS — M4185 Other forms of scoliosis, thoracolumbar region: Secondary | ICD-10-CM | POA: Diagnosis not present

## 2020-02-29 DIAGNOSIS — G894 Chronic pain syndrome: Secondary | ICD-10-CM | POA: Diagnosis not present

## 2020-02-29 DIAGNOSIS — F32A Depression, unspecified: Secondary | ICD-10-CM

## 2020-02-29 MED ORDER — ESCITALOPRAM OXALATE 5 MG PO TABS: 5.0000 mg | ORAL_TABLET | Freq: Every day | ORAL | 3 refills | Status: DC

## 2020-03-22 DIAGNOSIS — M810 Age-related osteoporosis without current pathological fracture: Secondary | ICD-10-CM | POA: Diagnosis not present

## 2020-03-22 DIAGNOSIS — E559 Vitamin D deficiency, unspecified: Secondary | ICD-10-CM | POA: Diagnosis not present

## 2020-04-12 ENCOUNTER — Other Ambulatory Visit: Payer: Self-pay | Admitting: Neurosurgery

## 2020-04-19 ENCOUNTER — Encounter
Admission: RE | Admit: 2020-04-19 | Discharge: 2020-04-19 | Disposition: A | Discharge status: Home / Self Care | Payer: Medicare HMO | Source: Ambulatory Visit | Attending: Neurosurgery | Admitting: Neurosurgery

## 2020-04-19 ENCOUNTER — Other Ambulatory Visit: Payer: Self-pay

## 2020-04-19 DIAGNOSIS — Z01812 Encounter for preprocedural laboratory examination: Secondary | ICD-10-CM | POA: Insufficient documentation

## 2020-04-19 LAB — URINALYSIS, ROUTINE W REFLEX MICROSCOPIC
Bilirubin Urine: NEGATIVE
Glucose, UA: NEGATIVE mg/dL
Ketones, ur: NEGATIVE mg/dL
Nitrite: POSITIVE — AB
Protein, ur: NEGATIVE mg/dL
Specific Gravity, Urine: 1.015 (ref 1.005–1.030)
WBC, UA: >50 WBC/hpf — ABNORMAL HIGH (ref 0–5)
pH: 5 (ref 5.0–8.0)

## 2020-04-19 LAB — BASIC METABOLIC PANEL
Anion gap: 9 (ref 5–15)
BUN: 12 mg/dL (ref 8–23)
CO2: 26 mmol/L (ref 22–32)
Calcium: 9.1 mg/dL (ref 8.9–10.3)
Chloride: 104 mmol/L (ref 98–111)
Creatinine, Ser: 0.91 mg/dL (ref 0.44–1.00)
GFR calc Af Amer: >60 mL/min (ref >60)
GFR calc non Af Amer: >60 mL/min (ref >60)
Glucose, Bld: 99 mg/dL (ref 70–99)
Potassium: 3.9 mmol/L (ref 3.5–5.1)
Sodium: 139 mmol/L (ref 135–145)

## 2020-04-19 LAB — SURGICAL PCR SCREEN
MRSA, PCR: NEGATIVE
Staphylococcus aureus: NEGATIVE

## 2020-04-19 LAB — APTT: aPTT: 28 seconds (ref 24–36)

## 2020-04-19 LAB — TYPE AND SCREEN
ABO/RH(D): AB POS
Antibody Screen: NEGATIVE

## 2020-04-19 LAB — PROTIME-INR
INR: 1 (ref 0.8–1.2)
Prothrombin Time: 12.3 seconds (ref 11.4–15.2)

## 2020-04-19 LAB — CBC
HCT: 37.4 % (ref 36.0–46.0)
Hemoglobin: 13 g/dL (ref 12.0–15.0)
MCH: 32 pg (ref 26.0–34.0)
MCHC: 34.8 g/dL (ref 30.0–36.0)
MCV: 92.1 fL (ref 80.0–100.0)
Platelets: 142 10*3/uL — ABNORMAL LOW (ref 150–400)
RBC: 4.06 MIL/uL (ref 3.87–5.11)
RDW: 12 % (ref 11.5–15.5)
WBC: 4.7 10*3/uL (ref 4.0–10.5)
nRBC: 0 % (ref 0.0–0.2)

## 2020-04-19 NOTE — Progress Notes (Signed)
  [facility] Perioperative Services: Pre-Admission/Anesthesia Testing  Abnormal Lab Notification   Date: 04/19/20  Name: Kimberly Cook MRN:   [id number]  Re: Abnormal labs noted during PAT appointment   Provider(s) Notified: Lucy Chris, MD Notification mode: Routed and/or faxed via [facility]   ABNORMAL LAB VALUE(S): Lab Results  Component Value Date   COLORURINE YELLOW (A) 04/19/2020   APPEARANCEUR CLOUDY (A) 04/19/2020   LABSPEC 1.015 04/19/2020   PHURINE 5.0 04/19/2020   GLUCOSEU NEGATIVE 04/19/2020   HGBUR SMALL (A) 04/19/2020   BILIRUBINUR NEGATIVE 04/19/2020   KETONESUR NEGATIVE 04/19/2020   PROTEINUR NEGATIVE 04/19/2020   UROBILINOGEN 0.2 12/16/2017   NITRITE POSITIVE (A) 04/19/2020   LEUKOCYTESUR LARGE (A) 04/19/2020   EPIU 0-5 04/19/2020   WBCU >50 (H) 04/19/2020   RBCU 6-10 04/19/2020   BACTERIA FEW (A) 04/19/2020   Notes:  UA consistent with infection. No leukocytosis noted on CBC. Renal function normal; Estimated Creatinine Clearance: 52.6 mL/min (by C-G formula based on SCr of 0.91 mg/dL).  Urine C&S added to assess for pathogenically significant growth. Patient scheduled for REPLACEMENT LEFT FLANK PULSE GENERATOR IMPLANT (Left ) on 04/24/2020. Will forward UA, and subsequent C&S results, to attending surgeon for review and Tx as deemed appropriate. This is a Personal assistant; no formal response is required.  Kimberly Mulling, MSN, APRN, FNP-C, CEN [facility]  Peri-operative Services Nurse Practitioner Phone: [phone] 04/19/20 1:43 PM

## 2020-04-19 NOTE — Patient Instructions (Signed)
Your procedure is scheduled on: Monday 04/24/20.  Report to DAY SURGERY DEPARTMENT LOCATED ON 2ND FLOOR MEDICAL MALL ENTRANCE. To find out your arrival time please call [phone] between 1PM - 3PM on Friday 04/21/20.   Remember: Instructions that are not followed completely may result in serious medical risk, up to and including death, or upon the discretion of your surgeon and anesthesiologist your surgery may need to be rescheduled.     __X__ 1. Do not eat food after midnight the night before your procedure.                 No gum chewing or hard candies. You may drink clear liquids up to 2 hours                 before you are scheduled to arrive for your surgery- DO NOT drink clear                 liquids within 2 hours of the start of your surgery.                 Clear Liquids include:  water, apple juice without pulp, clear carbohydrate                 drink such as Clearfast or Gatorade, Black Coffee or Tea (Do not add                 milk or creamer to coffee or tea).  __X__2.  On the morning of surgery brush your teeth with toothpaste and water, you may rinse your mouth with mouthwash if you wish.  Do not swallow any toothpaste or mouthwash.    __X__ 3.  No Alcohol for 24 hours before or after surgery.  __X__ 4.  Do Not Smoke or use e-cigarettes For 24 Hours Prior to Your Surgery.                 Do not use any chewable tobacco products for at least 6 hours prior to                 surgery.  __X__5.  Notify your doctor if there is any change in your medical condition      (cold, fever, infections).      Do NOT wear jewelry, make-up, hairpins, clips or nail polish. Do NOT wear lotions, powders, or perfumes.  Do NOT shave 48 hours prior to surgery. Men may shave face and neck. Do NOT bring valuables to the hospital.     [facility] is not responsible for any belongings or valuables.   Contacts, dentures/partials or body piercings may not be worn into surgery. Bring a  case for your contacts, glasses or hearing aids, a denture cup will be supplied.    Patients discharged the day of surgery will not be allowed to drive home.     __X__ Take these medicines the morning of surgery with A SIP OF WATER:     1. cetirizine (ZYRTEC)  2. escitalopram (LEXAPRO)  3. levothyroxine (SYNTHROID)   4. omeprazole (PRILOSEC)   5. tiZANidine (ZANAFLEX) if needed    __X__ Use CHG Soap as directed  __X__ Stop Blood Thinners:Aspirin. You have already stopped this for surgery.  __X__ Stop Anti-inflammatories 7 days before surgery such as Advil, Ibuprofen, Motrin, BC or Goodies Powder, Naprosyn, Naproxen, Aleve, Aspirin, Meloxicam. May take Tylenol if needed for pain or discomfort.   __X__Do not start taking any new herbal supplements  or vitamins prior to your procedure.  __X__ Stop the following herbal supplements or vitamins:  Turmeric Curcumin 500   Cranberry 500 MG CAPS   Wear comfortable clothing (specific to your surgery type) to the hospital.  Plan for stool softeners for home use; pain medications have a tendency to cause constipation. You can also help prevent constipation by eating foods high in fiber such as fruits and vegetables and drinking plenty of fluids as your diet allows.  After surgery, you can prevent lung complications by doing breathing exercises.Take deep breaths and cough every 1-2 hours. Your doctor may order a device called an Incentive Spirometer to help you take deep breaths.  Please call the Pre-Admissions Testing Department at [phone] if you have any questions about these instructions

## 2020-04-21 ENCOUNTER — Other Ambulatory Visit: Payer: Self-pay

## 2020-04-21 ENCOUNTER — Other Ambulatory Visit
Admission: RE | Admit: 2020-04-21 | Discharge: 2020-04-21 | Disposition: A | Discharge status: Home / Self Care | Payer: Medicare HMO | Source: Ambulatory Visit | Attending: Neurosurgery | Admitting: Neurosurgery

## 2020-04-21 DIAGNOSIS — Z01812 Encounter for preprocedural laboratory examination: Secondary | ICD-10-CM | POA: Diagnosis not present

## 2020-04-21 DIAGNOSIS — Z20822 Contact with and (suspected) exposure to covid-19: Secondary | ICD-10-CM | POA: Diagnosis not present

## 2020-04-21 LAB — URINE CULTURE: Culture: >=100000 — AB

## 2020-04-22 LAB — SARS CORONAVIRUS 2 (TAT 6-24 HRS): SARS Coronavirus 2: NEGATIVE

## 2020-04-24 ENCOUNTER — Encounter
Admission: RE | Disposition: A | Discharge status: Home / Self Care | Payer: Self-pay | Source: Home / Self Care | Attending: Neurosurgery

## 2020-04-24 ENCOUNTER — Ambulatory Visit: Payer: Medicare HMO

## 2020-04-24 ENCOUNTER — Ambulatory Visit: Payer: Medicare HMO | Admitting: Urgent Care

## 2020-04-24 ENCOUNTER — Encounter: Payer: Self-pay | Admitting: Neurosurgery

## 2020-04-24 ENCOUNTER — Ambulatory Visit
Admission: RE | Admit: 2020-04-24 | Discharge: 2020-04-24 | Disposition: A | Discharge status: Home / Self Care | Payer: Medicare HMO | Attending: Neurosurgery | Admitting: Neurosurgery

## 2020-04-24 ENCOUNTER — Other Ambulatory Visit: Payer: Self-pay

## 2020-04-24 DIAGNOSIS — Z7983 Long term (current) use of bisphosphonates: Secondary | ICD-10-CM | POA: Diagnosis not present

## 2020-04-24 DIAGNOSIS — E039 Hypothyroidism, unspecified: Secondary | ICD-10-CM | POA: Diagnosis not present

## 2020-04-24 DIAGNOSIS — F419 Anxiety disorder, unspecified: Secondary | ICD-10-CM | POA: Diagnosis not present

## 2020-04-24 DIAGNOSIS — N189 Chronic kidney disease, unspecified: Secondary | ICD-10-CM | POA: Insufficient documentation

## 2020-04-24 DIAGNOSIS — Z4542 Encounter for adjustment and management of neuropacemaker (brain) (peripheral nerve) (spinal cord): Secondary | ICD-10-CM | POA: Insufficient documentation

## 2020-04-24 DIAGNOSIS — N183 Chronic kidney disease, stage 3 unspecified: Secondary | ICD-10-CM | POA: Diagnosis not present

## 2020-04-24 DIAGNOSIS — Z419 Encounter for procedure for purposes other than remedying health state, unspecified: Secondary | ICD-10-CM

## 2020-04-24 DIAGNOSIS — Z8673 Personal history of transient ischemic attack (TIA), and cerebral infarction without residual deficits: Secondary | ICD-10-CM | POA: Insufficient documentation

## 2020-04-24 DIAGNOSIS — F329 Major depressive disorder, single episode, unspecified: Secondary | ICD-10-CM | POA: Insufficient documentation

## 2020-04-24 DIAGNOSIS — M1990 Unspecified osteoarthritis, unspecified site: Secondary | ICD-10-CM | POA: Diagnosis not present

## 2020-04-24 DIAGNOSIS — E785 Hyperlipidemia, unspecified: Secondary | ICD-10-CM | POA: Insufficient documentation

## 2020-04-24 DIAGNOSIS — R7303 Prediabetes: Secondary | ICD-10-CM | POA: Diagnosis not present

## 2020-04-24 DIAGNOSIS — Z8249 Family history of ischemic heart disease and other diseases of the circulatory system: Secondary | ICD-10-CM | POA: Insufficient documentation

## 2020-04-24 DIAGNOSIS — Z79899 Other long term (current) drug therapy: Secondary | ICD-10-CM | POA: Insufficient documentation

## 2020-04-24 DIAGNOSIS — Z79890 Hormone replacement therapy (postmenopausal): Secondary | ICD-10-CM | POA: Insufficient documentation

## 2020-04-24 DIAGNOSIS — I129 Hypertensive chronic kidney disease with stage 1 through stage 4 chronic kidney disease, or unspecified chronic kidney disease: Secondary | ICD-10-CM | POA: Diagnosis not present

## 2020-04-24 DIAGNOSIS — G894 Chronic pain syndrome: Secondary | ICD-10-CM | POA: Insufficient documentation

## 2020-04-24 DIAGNOSIS — K219 Gastro-esophageal reflux disease without esophagitis: Secondary | ICD-10-CM | POA: Diagnosis not present

## 2020-04-24 DIAGNOSIS — Z96652 Presence of left artificial knee joint: Secondary | ICD-10-CM | POA: Insufficient documentation

## 2020-04-24 DIAGNOSIS — Z9049 Acquired absence of other specified parts of digestive tract: Secondary | ICD-10-CM | POA: Insufficient documentation

## 2020-04-24 DIAGNOSIS — E782 Mixed hyperlipidemia: Secondary | ICD-10-CM | POA: Diagnosis not present

## 2020-04-24 DIAGNOSIS — Z7722 Contact with and (suspected) exposure to environmental tobacco smoke (acute) (chronic): Secondary | ICD-10-CM | POA: Insufficient documentation

## 2020-04-24 DIAGNOSIS — M5481 Occipital neuralgia: Secondary | ICD-10-CM | POA: Insufficient documentation

## 2020-04-24 DIAGNOSIS — E1122 Type 2 diabetes mellitus with diabetic chronic kidney disease: Secondary | ICD-10-CM | POA: Diagnosis not present

## 2020-04-24 DIAGNOSIS — Z833 Family history of diabetes mellitus: Secondary | ICD-10-CM | POA: Insufficient documentation

## 2020-04-24 DIAGNOSIS — Z90710 Acquired absence of both cervix and uterus: Secondary | ICD-10-CM | POA: Diagnosis not present

## 2020-04-24 DIAGNOSIS — I2510 Atherosclerotic heart disease of native coronary artery without angina pectoris: Secondary | ICD-10-CM | POA: Insufficient documentation

## 2020-04-24 DIAGNOSIS — T85113A Breakdown (mechanical) of implanted electronic neurostimulator, generator, initial encounter: Secondary | ICD-10-CM | POA: Diagnosis not present

## 2020-04-24 DIAGNOSIS — T85193A Other mechanical complication of implanted electronic neurostimulator, generator, initial encounter: Secondary | ICD-10-CM | POA: Diagnosis not present

## 2020-04-24 HISTORY — PX: PULSE GENERATOR IMPLANT: SHX5370

## 2020-04-24 SURGERY — UNILATERAL PULSE GENERATOR IMPLANT
Anesthesia: Monitor Anesthesia Care | Laterality: Left

## 2020-04-24 MED ORDER — BUPIVACAINE-EPINEPHRINE (PF) 0.5% -1:200000 IJ SOLN
INTRAMUSCULAR | Status: DC | PRN
  Administered 2020-04-24: 10 mL
  Administered 2020-04-24: 30 mL

## 2020-04-24 MED ORDER — CEFAZOLIN SODIUM-DEXTROSE 2-3 GM-%(50ML) IV SOLR
INTRAVENOUS | Status: DC | PRN
  Administered 2020-04-24: 2 g via INTRAVENOUS

## 2020-04-24 MED ORDER — MIDAZOLAM HCL 2 MG/2ML IJ SOLN
INTRAMUSCULAR | Status: DC | PRN
  Administered 2020-04-24: .5 mg via INTRAVENOUS
  Administered 2020-04-24: 1 mg via INTRAVENOUS
  Administered 2020-04-24: .5 mg via INTRAVENOUS

## 2020-04-24 MED ORDER — EPINEPHRINE PF 1 MG/ML IJ SOLN
INTRAMUSCULAR | Status: AC
  Filled 2020-04-24: qty 1

## 2020-04-24 MED ORDER — TRAMADOL HCL 50 MG PO TABS: 25.0000 mg | ORAL_TABLET | Freq: Four times a day (QID) | ORAL | 0 refills | Status: DC | PRN

## 2020-04-24 MED ORDER — FENTANYL CITRATE (PF) 100 MCG/2ML IJ SOLN
INTRAMUSCULAR | Status: AC
  Filled 2020-04-24: qty 2

## 2020-04-24 MED ORDER — LIDOCAINE HCL (CARDIAC) PF 100 MG/5ML IV SOSY
PREFILLED_SYRINGE | INTRAVENOUS | Status: DC | PRN
  Administered 2020-04-24: 40 mg via INTRAVENOUS

## 2020-04-24 MED ORDER — BUPIVACAINE HCL (PF) 0.5 % IJ SOLN
INTRAMUSCULAR | Status: AC
  Filled 2020-04-24: qty 30

## 2020-04-24 MED ORDER — PROPOFOL 500 MG/50ML IV EMUL
INTRAVENOUS | Status: DC | PRN
  Administered 2020-04-24: 75 ug/kg/min via INTRAVENOUS

## 2020-04-24 MED ORDER — VANCOMYCIN HCL 1 G IV SOLR
INTRAVENOUS | Status: DC | PRN
  Administered 2020-04-24: 1000 mg via TOPICAL

## 2020-04-24 MED ORDER — ONDANSETRON HCL 4 MG/2ML IJ SOLN: 4.0000 mg | Freq: Once | INTRAMUSCULAR | Status: DC | PRN

## 2020-04-24 MED ORDER — CEFAZOLIN SODIUM-DEXTROSE 2-4 GM/100ML-% IV SOLN
INTRAVENOUS | Status: AC
  Filled 2020-04-24: qty 100

## 2020-04-24 MED ORDER — PHENYLEPHRINE HCL (PRESSORS) 10 MG/ML IV SOLN
INTRAVENOUS | Status: AC
  Filled 2020-04-24: qty 1

## 2020-04-24 MED ORDER — KETAMINE HCL 10 MG/ML IJ SOLN
INTRAMUSCULAR | Status: DC | PRN
  Administered 2020-04-24 (×2): 10 mg via INTRAVENOUS

## 2020-04-24 MED ORDER — CHLORHEXIDINE GLUCONATE 0.12 % MT SOLN
OROMUCOSAL | Status: AC
  Filled 2020-04-24: qty 15

## 2020-04-24 MED ORDER — ORAL CARE MOUTH RINSE: 15.0000 mL | Freq: Once | OROMUCOSAL | Status: AC

## 2020-04-24 MED ORDER — SODIUM CHLORIDE (PF) 0.9 % IJ SOLN
INTRAMUSCULAR | Status: AC
  Filled 2020-04-24: qty 10

## 2020-04-24 MED ORDER — CEFAZOLIN SODIUM-DEXTROSE 2-4 GM/100ML-% IV SOLN: 2.0000 g | INTRAVENOUS | Status: DC

## 2020-04-24 MED ORDER — ACETAMINOPHEN 10 MG/ML IV SOLN
INTRAVENOUS | Status: DC | PRN
  Administered 2020-04-24: 1000 mg via INTRAVENOUS

## 2020-04-24 MED ORDER — LIDOCAINE HCL (PF) 2 % IJ SOLN
INTRAMUSCULAR | Status: AC
  Filled 2020-04-24: qty 5

## 2020-04-24 MED ORDER — PROPOFOL 500 MG/50ML IV EMUL
INTRAVENOUS | Status: AC
  Filled 2020-04-24: qty 100

## 2020-04-24 MED ORDER — ACETAMINOPHEN 10 MG/ML IV SOLN
INTRAVENOUS | Status: AC
  Filled 2020-04-24: qty 100

## 2020-04-24 MED ORDER — KETAMINE HCL 50 MG/ML IJ SOLN
INTRAMUSCULAR | Status: AC
  Filled 2020-04-24: qty 10

## 2020-04-24 MED ORDER — LACTATED RINGERS IV SOLN: INTRAVENOUS | Status: DC

## 2020-04-24 MED ORDER — MIDAZOLAM HCL 2 MG/2ML IJ SOLN
INTRAMUSCULAR | Status: AC
  Filled 2020-04-24: qty 2

## 2020-04-24 MED ORDER — FAMOTIDINE 20 MG PO TABS
20.0000 mg | ORAL_TABLET | Freq: Once | ORAL | Status: AC
  Administered 2020-04-24: 20 mg via ORAL

## 2020-04-24 MED ORDER — CHLORHEXIDINE GLUCONATE 0.12 % MT SOLN
15.0000 mL | Freq: Once | OROMUCOSAL | Status: AC
  Administered 2020-04-24: 15 mL via OROMUCOSAL

## 2020-04-24 MED ORDER — PROPOFOL 10 MG/ML IV BOLUS
INTRAVENOUS | Status: DC | PRN
  Administered 2020-04-24 (×2): 30 mg via INTRAVENOUS

## 2020-04-24 MED ORDER — FENTANYL CITRATE (PF) 100 MCG/2ML IJ SOLN: 25.0000 ug | INTRAMUSCULAR | Status: DC | PRN

## 2020-04-24 SURGICAL SUPPLY — 54 items
ACC NRSTM SPNL CORD STM CHRNC ×1 IMPLANT
ADH SKN CLS APL DERMABOND .7 (GAUZE/BANDAGES/DRESSINGS)
APL PRP STRL LF DISP 70% ISPRP (MISCELLANEOUS) ×2
BUR NEURO DRILL SOFT 3.0X3.8M (BURR) ×2 IMPLANT
CANISTER SUCT 1200ML W/VALVE (MISCELLANEOUS) ×2 IMPLANT
CHLORAPREP W/TINT 26 (MISCELLANEOUS) ×4 IMPLANT
COUNTER NEEDLE 20/40 LG (NEEDLE) ×2 IMPLANT
COVER LIGHT HANDLE STERIS (MISCELLANEOUS) ×4 IMPLANT
COVER WAND RF STERILE (DRAPES) ×2 IMPLANT
CUP MEDICINE 2OZ PLAST GRAD ST (MISCELLANEOUS) ×2 IMPLANT
DERMABOND ADVANCED (GAUZE/BANDAGES/DRESSINGS)
DERMABOND ADVANCED .7 DNX12 (GAUZE/BANDAGES/DRESSINGS) IMPLANT
DRAPE C-ARMOR (DRAPES) IMPLANT
DRAPE INCISE IOBAN 66X45 STRL (DRAPES) ×2 IMPLANT
DRAPE LAPAROTOMY 77X122 PED (DRAPES) ×2 IMPLANT
DRAPE SURG 17X11 SM STRL (DRAPES) ×2 IMPLANT
DRSG TEGADERM 4X4.75 (GAUZE/BANDAGES/DRESSINGS) IMPLANT
DRSG TELFA 4X3 1S NADH ST (GAUZE/BANDAGES/DRESSINGS) IMPLANT
ELECT CAUTERY BLADE TIP 2.5 (TIP) ×2
ELECT REM PT RETURN 9FT ADLT (ELECTROSURGICAL) ×2
ELECTRODE CAUTERY BLDE TIP 2.5 (TIP) ×1 IMPLANT
ELECTRODE REM PT RTRN 9FT ADLT (ELECTROSURGICAL) ×1 IMPLANT
ENVELOPE ABSORB ANTIBACTERIAL (Neuro Prosthesis/Implant) ×2 IMPLANT
FEE INTRAOP MONITOR IMPULS NCS (MISCELLANEOUS) IMPLANT
GAUZE SPONGE 4X4 12PLY STRL (GAUZE/BANDAGES/DRESSINGS) ×2 IMPLANT
GLOVE BIOGEL PI IND STRL 7.0 (GLOVE) ×1 IMPLANT
GLOVE BIOGEL PI INDICATOR 7.0 (GLOVE) ×1
GLOVE INDICATOR 8.0 STRL GRN (GLOVE) ×2 IMPLANT
GLOVE SURG SYN 7.0 (GLOVE) ×4 IMPLANT
GLOVE SURG SYN 8.0 (GLOVE) ×4 IMPLANT
GOWN STRL REUS W/ TWL LRG LVL3 (GOWN DISPOSABLE) ×1 IMPLANT
GOWN STRL REUS W/ TWL XL LVL3 (GOWN DISPOSABLE) ×2 IMPLANT
GOWN STRL REUS W/TWL LRG LVL3 (GOWN DISPOSABLE) ×2
GOWN STRL REUS W/TWL XL LVL3 (GOWN DISPOSABLE) ×4
GRADUATE 1200CC STRL 31836 (MISCELLANEOUS) ×2 IMPLANT
INTRAOP MONITOR FEE IMPULS NCS (MISCELLANEOUS)
INTRAOP MONITOR FEE IMPULSE (MISCELLANEOUS)
KIT TURNOVER KIT A (KITS) ×2 IMPLANT
KIT WILSON FRAME (KITS) IMPLANT
MARKER SKIN DUAL TIP RULER LAB (MISCELLANEOUS) ×2 IMPLANT
NEEDLE HYPO 22GX1.5 SAFETY (NEEDLE) ×2 IMPLANT
NS IRRIG 1000ML POUR BTL (IV SOLUTION) ×2 IMPLANT
PACK LAMINECTOMY NEURO (CUSTOM PROCEDURE TRAY) ×2 IMPLANT
PAD ARMBOARD 7.5X6 YLW CONV (MISCELLANEOUS) ×2 IMPLANT
RECHARGER VANTA TM NEURO STIM ×2 IMPLANT
REPROGRAMMER VANTA TM STIM ×2 IMPLANT
SUT ETHILON 3-0 FS-10 30 BLK (SUTURE)
SUT POLYSORB 2-0 5X18 GS-10 (SUTURE) ×12 IMPLANT
SUT SILK 2 0 PERMA HAND 18 BK (SUTURE) ×4 IMPLANT
SUT SILK 2 0 SH (SUTURE) ×2 IMPLANT
SUTURE EHLN 3-0 FS-10 30 BLK (SUTURE) IMPLANT
SYR BULB IRRIG 60ML STRL (SYRINGE) ×2 IMPLANT
TOWEL OR 17X26 4PK STRL BLUE (TOWEL DISPOSABLE) ×4 IMPLANT
Vanta Handheld ×2 IMPLANT

## 2020-04-24 NOTE — Op Note (Signed)
Operative Note  SURGERY DATE:04/24/2020  PRE-OP DIAGNOSIS: Chronic Pain Syndrome(G89.4)  POST-OP DIAGNOSIS:Chronic Pain Syndrome(G89.4)  Procedure(s) with comments: Revision and replacement Left Flank Pulse Generator   SURGEON:  * Nathaniel Man, MD Patsey Berthold, NP, Assistant  ANESTHESIA:IV anesthetic, local  OPERATIVE FINDINGS:Failed SCS Pulse generator  Indications: Kimberly Cook underwent a SCS placement earlier this year but has had difficulty with charging her device and getting full pain relief. When working, she states it adequately covers pain areas.  Given this, she wished to proceed with pulse generator replacement to a non-rechargeable one. The risks including hematoma, infection, damage to spinal cord stimulator wires, failure of relief of pain, and need for revision surgery were discussed. The patient elected to proceed with the surgery  Procedure: The patient was brought to the operating room and placed prone on a standard OR table. The anesthesia service had vascular access and provided sedation medications to the patient. The left flank incision was prepped and draped in a sterile fashion. A hard timeout was performed. Local anesthetic was instilled into the incision.   The left flank incision was opened sharply to expose the wires and the pulse generator removed. The leads were seen to be intact. The leads were removed from the existing pulse genrator and then cleaned.   Hemostasis was obtained and the battery pocket was irrigated with saline. Electrodes were attached to the new pulse generator and interrogated. The impedences were found to be normal. Electrodes were secured and tightened. The battery was placed into the pocket in antibiotic pouch and secured with a 2-0 silk suture. Vancomycin powder was placed into the pocket.  The deep tissue was closed with 2-0 Vicryl and subcutaneous with 2-0 Vicryl. The skin was closed  with Dermabond. A dressing was applied. Patient was turned to the supine position and sedation medications were stopped and the patient was awoken from anesthesia. Patient was taken to the PACU for further recovery and seen to be at neurologic baseline. I discussed the results with the family        ESTIMATED BLOOD LOSS: 20cc   IMPLANT RECHARGER Jim Like STIM - [id number]  Inventory Item: Marinell Blight NEURO STIM Serial no.: [id number] Model/Cat no.: [id number]  Implant name: Marinell Blight NEURO STIM - [id number] Laterality: Left Area: Back   Manufacturer: [employer] NEUROMOD PAIN MGMT Date of Manufacture:    Action: Implanted Number Used: 1   Device Identifier:  Device Identifier Type:    ENVELOPE ABSORB ANTIBACTERIAL - [id number]  Inventory Item: ENVELOPE ABSORB ANTIBACTERIAL Serial no.:  Model/Cat no.: [id number]  Implant name: ENVELOPE ABSORB ANTIBACTERIAL - [id number] Laterality: Left Area: Back   Manufacturer: [employer] [place] [employer] Date of Manufacture:    Action: Implanted Number Used: 1   Device Identifier:  Device Identifier Type:        I performed the case in its entiretywith assistance of Patsey Berthold, NP  Lucy Chris, MD [phone]

## 2020-04-24 NOTE — Transfer of Care (Signed)
Immediate Anesthesia Transfer of Care Note  Patient: Kimberly Cook  Procedure(s) Performed: REPLACEMENT LEFT FLANK PULSE GENERATOR IMPLANT (Left )  Patient Location: PACU  Anesthesia Type:General  Level of Consciousness: drowsy  Airway & Oxygen Therapy: Patient Spontanous Breathing and Patient connected to face mask oxygen  Post-op Assessment: Report given to RN and Post -op Vital signs reviewed and stable  Post vital signs: Reviewed and stable  Last Vitals:  Vitals Value Taken Time  BP 110/69 04/24/20 0853  Temp    Pulse 77 04/24/20 0854  Resp 18 04/24/20 0854  SpO2 100 % 04/24/20 0854  Vitals shown include unvalidated device data.  Last Pain:  Vitals:   04/24/20 0623  TempSrc: Oral  PainSc: 0-No pain         Complications: No complications documented.

## 2020-04-24 NOTE — Progress Notes (Signed)
   04/24/20 0755  Clinical Encounter Type  Visited With Family  Visit Type Initial  Referral From Chaplain  Consult/Referral To Chaplain  While rounding SDS waiting area, chaplain briefly visited with pt's friend as she waited, tryin gto find out how she was doing. Pt's friend said she is fine.

## 2020-04-24 NOTE — Discharge Instructions (Addendum)
You can take the dressing off of your incision in two days and shower in 3 days.   Your surgeon has performed an operation on your lumbar spine (low back). Many times, patients feel better immediately after surgery and can "overdo it." Even if you feel well, it is important that you follow these activity guidelines. If you do not let your back heal properly from the surgery, you can increase the chance of a disc herniation and/or return of your symptoms. The following are instructions to help in your recovery once you have been discharged from the hospital.  * Do not take anti-inflammatory medications for 3 days after surgery (naproxen [Aleve], ibuprofen [Advil, Motrin], celecoxib [Celebrex], etc.)  Activity    No bending, lifting, or twisting ("BLT"). Avoid lifting objects heavier than 10 pounds (gallon milk jug).  Where possible, avoid household activities that involve lifting, bending, pushing, or pulling such as laundry, vacuuming, grocery shopping, and childcare. Try to arrange for help from friends and family for these activities while your back heals.  Increase physical activity slowly as tolerated.  Taking short walks is encouraged, but avoid strenuous exercise. Do not jog, run, bicycle, lift weights, or participate in any other exercises unless specifically allowed by your doctor. Avoid prolonged sitting, including car rides.  Talk to your doctor before resuming sexual activity.  You should not drive until cleared by your doctor.  Until released by your doctor, you should not return to work or school.  You should rest at home and let your body heal.   You may shower two days after your surgery.  After showering, lightly dab your incision dry. Do not take a tub bath or go swimming for 3 weeks, or until approved by your doctor at your follow-up appointment.  If you smoke, we strongly recommend that you quit.  Smoking has been proven to interfere with normal healing in your back and will  dramatically reduce the success rate of your surgery. Please contact [facility] (800-QUIT-NOW) and use the resources at www.[facility].com for assistance in stopping smoking.  Surgical Incision   If you have a dressing on your incision, you may remove it three days after your surgery. Keep your incision area clean and dry.  If you have staples or stitches on your incision, you should have a follow up scheduled for removal. If you do not have staples or stitches, you will have steri-strips (small pieces of surgical tape) or Dermabond glue. The steri-strips/glue should begin to peel away within about a week (it is fine if the steri-strips fall off before then). If the strips are still in place one week after your surgery, you may gently remove them.  Diet            You may return to your usual diet. Be sure to stay hydrated.  When to Contact us  Although your surgery and recovery will likely be uneventful, you may have some residual numbness, aches, and pains in your back and/or legs. This is normal and should improve in the next few weeks.  However, should you experience any of the following, contact us immediately: . New numbness or weakness . Pain that is progressively getting worse, and is not relieved by your pain medications or rest . Bleeding, redness, swelling, pain, or drainage from surgical incision . Chills or flu-like symptoms . Fever greater than 101.0 F (38.3 C) . Problems with bowel or bladder functions . Difficulty breathing or shortness of breath . Warmth, tenderness, or swelling  in your calf  Contact Information . During office hours (Monday-Friday 9 am to 5 pm), please call your physician at [phone] . After hours and weekends, please call [phone] and an answering service will put you in touch with either Dr. Adriana Simas or Dr. Myer Haff.  . For a life-threatening emergency, call 911   AMBULATORY SURGERY  DISCHARGE INSTRUCTIONS   1) The drugs that you were  given will stay in your system until tomorrow so for the next 24 hours you should not:  A) Drive an automobile B) Make any legal decisions C) Drink any alcoholic beverage   2) You may resume regular meals tomorrow.  Today it is better to start with liquids and gradually work up to solid foods.  You may eat anything you prefer, but it is better to start with liquids, then soup and crackers, and gradually work up to solid foods.   3) Please notify your doctor immediately if you have any unusual bleeding, trouble breathing, redness and pain at the surgery site, drainage, fever, or pain not relieved by medication.    4) Additional Instructions:        Please contact your physician with any problems or Same Day Surgery at [phone], Monday through Friday 6 am to 4 pm, or [place] [facility] number at [phone].

## 2020-04-24 NOTE — Interval H&P Note (Signed)
History and Physical Interval Note:  04/24/2020 6:56 AM  Kimberly Cook  has presented today for surgery, with the diagnosis of failed spinal cord stimulator battery.  The various methods of treatment have been discussed with the patient and family. After consideration of risks, benefits and other options for treatment, the patient has consented to  Procedure(s) with comments: REPLACEMENT LEFT FLANK PULSE GENERATOR IMPLANT (Left) - MAC w/ local as a surgical intervention.  The patient's history has been reviewed, patient examined, no change in status, stable for surgery.  I have reviewed the patient's chart and labs.  Questions were answered to the patient's satisfaction.     Deetta Perla

## 2020-04-24 NOTE — H&P (Signed)
Kimberly Cook is an 74 y.o. female.   Chief Complaint: Failed Pulse generator HPI:Kimberly Cook is here for evaluation now 3 months out from her spinal cord stimulator placement. She states the stimulator is controlling her preop pain very well but she is having some new pain from the surgical sites. She does get some swelling around the left flank. Additionally, she was having some pain near the midline incision but this is resolving now she is doing with more right paramedian pain. She has had difficulty charging the battery and therefore not getting any sustained relief from stimulator. We discussed replacement and she wishes to proceed   Past Medical History:  Diagnosis Date  . Allergy   . Anxiety   . Arthritis   . Back pain   . Chronic kidney disease    STAGE 3  . Coronary artery disease 04/2017   Mild to moderate CAD in LAD/diagonal by CTA (CT-FFR of apical LAD 0.79).  . DDD (degenerative disc disease), lumbar   . Depression   . Diverticulosis   . Essential hypertension    Normal cardiolite 05/2006 EF 71%  . GERD (gastroesophageal reflux disease)   . Headache   . History of shingles   . Hyperlipidemia   . Hypothyroidism   . Lung nodule   . Mini stroke (HCC) 2011   . Occipital neuralgia   . Palpitations   . Pneumonia 2018  . Prediabetes   . Stroke (HCC)   . Stroke [facility])    MRI 04/2008 + left sup. frontal gyrus possibly puntate infarct   . Syncope 2019  . Urinary tract infection   . Vitamin D deficiency     Past Surgical History:  Procedure Laterality Date  . ABDOMINAL HYSTERECTOMY    . BLADDER SURGERY     2003  . BREAST EXCISIONAL BIOPSY Right Over 20 years    Benign  . CHOLECYSTECTOMY    . gastroplication     . JOINT REPLACEMENT Left    KNEE  . KNEE ARTHROSCOPY Left 2011  . PULSE GENERATOR IMPLANT Left 01/31/2020   Procedure: PLACEMENT RIGHT FLANK PULSE GENERATOR VS REMOVAL SPINAL CORD STIMULATOR;  Surgeon: Lucy Chris, MD;  Location: [facility] ORS;  Service:  Neurosurgery;  Laterality: Left;  . right arm fracture    . THORACIC LAMINECTOMY FOR SPINAL CORD STIMULATOR N/A 01/24/2020   Procedure: THORACIC SPINAL CORD STIMULATOR PADDLE TRIAL VIA LAMINECTOMY;  Surgeon: Lucy Chris, MD;  Location: [facility] ORS;  Service: Neurosurgery;  Laterality: N/A;  . TONSILLECTOMY AND ADENOIDECTOMY    . TOTAL KNEE ARTHROPLASTY Left 04/17/2015   Procedure: LEFT TOTAL KNEE ARTHROPLASTY;  Surgeon: Durene Romans, MD;  Location: [facility] ORS;  Service: Orthopedics;  Laterality: Left;    Family History  Problem Relation Age of Onset  . Heart disease Mother   . Hypertension Mother   . Diabetes Mother   . Heart attack Mother 54  . Heart disease Father   . Heart attack Father 37  . Breast cancer Maternal Aunt   . Heart attack Brother    Social History:  reports that she is a non-smoker but has been exposed to tobacco smoke. She has never used smokeless tobacco. She reports that she does not drink alcohol and does not use drugs.  Allergies: No Known Allergies  Medications Prior to Admission  Medication Sig Dispense Refill  . acetaminophen (TYLENOL) 325 MG tablet Take 2 tablets (650 mg total) by mouth every 4 (four) hours as needed for mild pain ((score  1 to 3) or temp > 100.5).    Marland Kitchen alendronate (FOSAMAX) 70 MG tablet Take 70 mg by mouth once a week.     Marland Kitchen atorvastatin (LIPITOR) 20 MG tablet Take 1 tablet (20 mg total) by mouth daily. (Patient taking differently: Take 20 mg by mouth at bedtime. ) 90 tablet 3  . Biotin 1 MG CAPS Take 1 mg by mouth daily.     Marland Kitchen CALCIUM PO Take 1,200 mg by mouth daily.     . cetirizine (ZYRTEC) 10 MG tablet Take 10 mg by mouth every morning.    . Cholecalciferol (VITAMIN D3) 125 MCG (5000 UT) TABS Take 1 tablet (5,000 Units total) by mouth daily. 90 tablet 3  . Cranberry 500 MG CAPS Take 500 mg by mouth daily.    Marland Kitchen diltiazem (CARDIZEM SR) 60 MG 12 hr capsule Take 1 capsule (60 mg total) by mouth 2 (two) times daily. 180 capsule 3  . escitalopram  (LEXAPRO) 5 MG tablet Take 1 tablet (5 mg total) by mouth daily. 90 tablet 3  . fluticasone (FLONASE) 50 MCG/ACT nasal spray Place 2 sprays into both nostrils daily as needed for allergies or rhinitis. Use after nasal saline 16 g 12  . folic acid (FOLVITE) 400 MCG tablet Take 1 tablet (400 mcg total) by mouth daily. SEPARATE ALL SUPPLEMENTS TO LUNCH OR DINNER AND PRILOSEC NOT TO MESS W/THYROID MED 90 tablet 3  . gabapentin (NEURONTIN) 300 MG capsule 1 pill in the am and lunch and 2 pills qhs (Patient taking differently: Take 600 mg by mouth at bedtime. )    . hydrALAZINE (APRESOLINE) 10 MG tablet Take 2 tablets (20 mg total) by mouth 2 (two) times daily as needed. BP>140/>90 180 tablet 3  . hydrochlorothiazide (MICROZIDE) 12.5 MG capsule Take 1 capsule (12.5 mg total) by mouth daily. In am 90 capsule 3  . levothyroxine (SYNTHROID) 88 MCG tablet Take 1 tablet (88 mcg total) by mouth daily before breakfast. Skip sundays 90 tablet 3  . LORazepam (ATIVAN) 0.5 MG tablet Take 1 tablet (0.5 mg total) by mouth 2 (two) times daily as needed for anxiety or sleep. Or 1 mg at night for sleep 60 tablet 5  . mirabegron ER (MYRBETRIQ) 50 MG TB24 tablet Take 1 tablet (50 mg total) by mouth daily. (Patient taking differently: Take 50 mg by mouth at bedtime. ) 30 tablet 11  . montelukast (SINGULAIR) 10 MG tablet Take 1 tablet (10 mg total) by mouth at bedtime. 90 tablet 3  . naloxegol oxalate (MOVANTIK) 12.5 MG TABS tablet Take 1-2 tablets (12.5-25 mg total) by mouth daily. Constipation x 3 days prn 6 tablet 0  . omeprazole (PRILOSEC) 40 MG capsule Take 1 capsule (40 mg total) by mouth daily. (Patient taking differently: Take 40 mg by mouth daily after lunch. ) 90 capsule 3  . polyethylene glycol powder (GLYCOLAX/MIRALAX) 17 GM/SCOOP powder Take 17 g by mouth daily as needed for moderate constipation. 255 g 11  . potassium chloride (KLOR-CON) 10 MEQ tablet Take 1 tablet (10 mEq total) by mouth daily. 90 tablet 3  .  Probiotic Product (HEALTHY COLON PO) Take 1 capsule by mouth daily.    . sodium chloride (OCEAN) 0.65 % SOLN nasal spray Place 2 sprays into both nostrils as needed for congestion. Use 1st 1 Bottle 12  . tiZANidine (ZANAFLEX) 4 MG tablet Take 1 tablet (4 mg total) by mouth 2 (two) times daily as needed for muscle spasms. 60 tablet 5  .  traMADol (ULTRAM) 50 MG tablet Take 25-50 mg by mouth every 6 (six) hours as needed for pain.    . traZODone (DESYREL) 50 MG tablet Take 1-2 tablets (50-100 mg total) by mouth at bedtime as needed for sleep. 1 hr before bed (Patient taking differently: Take 100 mg by mouth at bedtime. 1 hr before bed) 60 tablet 11  . Turmeric Curcumin 500 MG CAPS Take 500 mg by mouth daily.    . Vaginal Lubricant (REPLENS) GEL Place 1 application vaginally 3 (three) times a week.    . vitamin B-12 (CYANOCOBALAMIN) 1000 MCG tablet Take 1 tablet (1,000 mcg total) by mouth daily. 90 tablet 3  . cephALEXin (KEFLEX) 500 MG capsule Take 1 capsule (500 mg total) by mouth 2 (two) times daily. (Patient not taking: Reported on 04/17/2020) 20 capsule 0    No results found for this or any previous visit (from the past 48 hour(s)). No results found.  Review of Systems General ROS: Negative Respiratory ROS: Negative Cardiovascular ROS: Negative Gastrointestinal ROS: Negative Genito-Urinary ROS: Negative Musculoskeletal ROS: Positive for back pain Neurological ROS: Negative for leg pain or numbness Dermatological ROS: Negative   Blood pressure (!) 143/93, pulse 91, temperature 97.8 F (36.6 C), temperature source Oral, resp. rate 18, height 5\' 3"  (1.6 m), weight 72.6 kg, SpO2 98 %. Physical Exam  General appearance: Alert, cooperative, in no acute distress Back: Well-healed midline and left flank incision, well-healed exit site on the right flank  Neurologic exam:  Mental status: alertness: alert, affect: normal Speech: fluent and clear Motor:strength symmetric 5/5 in bilateral lower  extremities Sensory: intact to light touch in bilateral lower extremities Gait: Using walker  Assessment/Plan Plan for pulse generator replacement today  , MD 04/24/2020, 6:48 AM

## 2020-04-24 NOTE — OR Nursing (Signed)
Ambulatory, tolerating po's, voided.  Discharged to home.

## 2020-04-24 NOTE — Anesthesia Preprocedure Evaluation (Signed)
Anesthesia Evaluation  Patient identified by MRN, date of birth, ID band Patient awake    Reviewed: Allergy & Precautions, H&P , NPO status , Patient's Chart, lab work & pertinent test results, reviewed documented beta blocker date and time   Airway Mallampati: II  TM Distance: >3 FB Neck ROM: full    Dental no notable dental hx. (+) Teeth Intact   Pulmonary shortness of breath and with exertion, pneumonia,    Pulmonary exam normal breath sounds clear to auscultation       Cardiovascular Exercise Tolerance: Poor hypertension, On Medications + angina with exertion + CAD   Rhythm:regular Rate:Normal     Neuro/Psych  Headaches, PSYCHIATRIC DISORDERS Anxiety Depression  Neuromuscular disease CVA    GI/Hepatic Neg liver ROS, hiatal hernia, GERD  Medicated,  Endo/Other  diabetesHypothyroidism   Renal/GU Renal disease     Musculoskeletal   Abdominal   Peds  Hematology negative hematology ROS (+)   Anesthesia Other Findings   Reproductive/Obstetrics negative OB ROS                             Anesthesia Physical Anesthesia Plan  ASA: III  Anesthesia Plan: MAC   Post-op Pain Management:    Induction:   PONV Risk Score and Plan:   Airway Management Planned:   Additional Equipment:   Intra-op Plan:   Post-operative Plan:   Informed Consent: I have reviewed the patients History and Physical, chart, labs and discussed the procedure including the risks, benefits and alternatives for the proposed anesthesia with the patient or authorized representative who has indicated his/her understanding and acceptance.       Plan Discussed with: CRNA  Anesthesia Plan Comments:         Anesthesia Quick Evaluation

## 2020-04-24 NOTE — Discharge Summary (Signed)
Procedure: IPG exchange Procedure date: 04/24/2020 Diagnosis: failed SCS battery    History: Kimberly Cook is s/p SCS battery exchange POD0: Tolerated procedure well. Evaluated in post op recovery still disoriented from anesthesia but able to answer questions and obey commands.   Physical Exam: Vitals:   04/24/20 0623  BP: (!) 143/93  Pulse: 91  Resp: 18  Temp: 97.8 F (36.6 C)  SpO2: 98%    General: Alert and oriented, lying in bed Strength:5/5 throughout  Sensation: intact and symmetric throughout  Skin: dressing over incision is clean, dry, intact  Data:  Recent Labs  Lab 04/19/20 1155  NA 139  K 3.9  CL 104  CO2 26  BUN 12  CREATININE 0.91  GLUCOSE 99  CALCIUM 9.1   No results for input(s): AST, ALT, ALKPHOS in the last 168 hours.  Invalid input(s): TBILI   Recent Labs  Lab 04/19/20 1155  WBC 4.7  HGB 13.0  HCT 37.4  PLT 142*   Recent Labs  Lab 04/19/20 1155  APTT 28  INR 1.0         Assessment/Plan:  Kimberly Cook is POD0 s/p SCS battery exchange. Once she is able to tolerate PO, ambulate, and urinate, she is cleared for discharge to home.  She will f/u with [place] in two weeks.    Patsey Berthold, NP Department of Neurosurgery

## 2020-04-25 ENCOUNTER — Encounter: Payer: Self-pay | Admitting: Internal Medicine

## 2020-04-25 ENCOUNTER — Ambulatory Visit (INDEPENDENT_AMBULATORY_CARE_PROVIDER_SITE_OTHER): Payer: Medicare HMO | Admitting: Internal Medicine

## 2020-04-25 VITALS — BP 126/86 | HR 80 | Temp 98.4°F | Ht 63.0 in | Wt 168.6 lb

## 2020-04-25 DIAGNOSIS — E663 Overweight: Secondary | ICD-10-CM

## 2020-04-25 DIAGNOSIS — I10 Essential (primary) hypertension: Secondary | ICD-10-CM

## 2020-04-25 DIAGNOSIS — M545 Low back pain, unspecified: Secondary | ICD-10-CM

## 2020-04-25 DIAGNOSIS — M810 Age-related osteoporosis without current pathological fracture: Secondary | ICD-10-CM

## 2020-04-25 DIAGNOSIS — Z9689 Presence of other specified functional implants: Secondary | ICD-10-CM | POA: Diagnosis not present

## 2020-04-25 DIAGNOSIS — D696 Thrombocytopenia, unspecified: Secondary | ICD-10-CM

## 2020-04-25 DIAGNOSIS — E039 Hypothyroidism, unspecified: Secondary | ICD-10-CM

## 2020-04-25 NOTE — Progress Notes (Signed)
Chief Complaint  Patient presents with  . Back Pain   F/u  1. S/p reimplantation of spinal cord stimulator left back 04/24/20 with 9/10 back pain did not take tramadol today area closed dermabond and had chloroprep and vancomycin powder area is slightly red and swollen  2.osteoporosis on fosamax weekly since 07/2019 f/u [facility]  3.hypothyroidism on levo 88 mcg qd  4. HTN controlled on meds dilt 60 bid, hydrlaazine 10 hctz 12.5   Review of Systems  Constitutional: Negative for weight loss.  HENT: Negative for hearing loss.   Eyes: Negative for blurred vision.  Respiratory: Negative for sputum production.   Cardiovascular: Negative for chest pain.  Gastrointestinal: Negative for abdominal pain.  Genitourinary: Negative for dysuria.  Musculoskeletal: Positive for back pain.  Skin: Negative for rash.  Neurological: Negative for headaches.  Psychiatric/Behavioral: Negative for depression.   Past Medical History:  Diagnosis Date  . Allergy   . Anxiety   . Arthritis   . Back pain   . Chronic kidney disease    STAGE 3  . Coronary artery disease 04/2017   Mild to moderate CAD in LAD/diagonal by CTA (CT-FFR of apical LAD 0.79).  . DDD (degenerative disc disease), lumbar   . Depression   . Diverticulosis   . Essential hypertension    Normal cardiolite 05/2006 EF 71%  . GERD (gastroesophageal reflux disease)   . Headache   . History of shingles   . Hyperlipidemia   . Hypothyroidism   . Lung nodule   . Mini stroke (HCC) 2011   . Occipital neuralgia   . Palpitations   . Pneumonia 2018  . Prediabetes   . Stroke (HCC)   . Stroke [facility])    MRI 04/2008 + left sup. frontal gyrus possibly puntate infarct   . Syncope 2019  . Urinary tract infection   . Vitamin D deficiency    Past Surgical History:  Procedure Laterality Date  . ABDOMINAL HYSTERECTOMY    . BLADDER SURGERY     2003  . BREAST EXCISIONAL BIOPSY Right Over 20 years    Benign  . CHOLECYSTECTOMY    .  gastroplication     . JOINT REPLACEMENT Left    KNEE  . KNEE ARTHROSCOPY Left 2011  . PULSE GENERATOR IMPLANT Left 01/31/2020   Procedure: PLACEMENT RIGHT FLANK PULSE GENERATOR VS REMOVAL SPINAL CORD STIMULATOR;  Surgeon: Lucy Chris, MD;  Location: [facility] ORS;  Service: Neurosurgery;  Laterality: Left;  . right arm fracture    . THORACIC LAMINECTOMY FOR SPINAL CORD STIMULATOR N/A 01/24/2020   Procedure: THORACIC SPINAL CORD STIMULATOR PADDLE TRIAL VIA LAMINECTOMY;  Surgeon: Lucy Chris, MD;  Location: [facility] ORS;  Service: Neurosurgery;  Laterality: N/A;  . TONSILLECTOMY AND ADENOIDECTOMY    . TOTAL KNEE ARTHROPLASTY Left 04/17/2015   Procedure: LEFT TOTAL KNEE ARTHROPLASTY;  Surgeon: Durene Romans, MD;  Location: [facility] ORS;  Service: Orthopedics;  Laterality: Left;   Family History  Problem Relation Age of Onset  . Heart disease Mother   . Hypertension Mother   . Diabetes Mother   . Heart attack Mother 30  . Heart disease Father   . Heart attack Father 38  . Breast cancer Maternal Aunt   . Heart attack Brother    Social History   Socioeconomic History  . Marital status: Widowed    Spouse name: Not on file  . Number of children: Not on file  . Years of education: Not on file  . Highest education  level: Not on file  Occupational History  . Not on file  Tobacco Use  . Smoking status: Passive Smoke Exposure - Never Smoker  . Smokeless tobacco: Never Used  . Tobacco comment: husbands and children smoked in home.   Vaping Use  . Vaping Use: Never used  Substance and Sexual Activity  . Alcohol use: No  . Drug use: No  . Sexual activity: Never  Other Topics Concern  . Not on file  Social History Narrative   Widowed    Lives [facility]    Has kids and grandson    Social Determinants of Health   Financial Resource Strain:   . Difficulty of Paying Living Expenses: Not on file  Food Insecurity:   . Worried About Programme researcher, broadcasting/film/video in the Last Year: Not on file  . Ran Out of  Food in the Last Year: Not on file  Transportation Needs:   . Lack of Transportation (Medical): Not on file  . Lack of Transportation (Non-Medical): Not on file  Physical Activity:   . Days of Exercise per Week: Not on file  . Minutes of Exercise per Session: Not on file  Stress:   . Feeling of Stress : Not on file  Social Connections:   . Frequency of Communication with Friends and Family: Not on file  . Frequency of Social Gatherings with Friends and Family: Not on file  . Attends Religious Services: Not on file  . Active Member of Clubs or Organizations: Not on file  . Attends Banker Meetings: Not on file  . Marital Status: Not on file  Intimate Partner Violence:   . Fear of Current or Ex-Partner: Not on file  . Emotionally Abused: Not on file  . Physically Abused: Not on file  . Sexually Abused: Not on file   Current Meds  Medication Sig  . acetaminophen (TYLENOL) 325 MG tablet Take 2 tablets (650 mg total) by mouth every 4 (four) hours as needed for mild pain ((score 1 to 3) or temp > 100.5).  Marland Kitchen alendronate (FOSAMAX) 70 MG tablet Take 70 mg by mouth once a week.   Marland Kitchen atorvastatin (LIPITOR) 20 MG tablet Take 1 tablet (20 mg total) by mouth daily. (Patient taking differently: Take 20 mg by mouth at bedtime. )  . Biotin 1 MG CAPS Take 1 mg by mouth daily.   Marland Kitchen CALCIUM PO Take 1,200 mg by mouth daily.   . cetirizine (ZYRTEC) 10 MG tablet Take 10 mg by mouth every morning.  . Cholecalciferol (VITAMIN D3) 125 MCG (5000 UT) TABS Take 1 tablet (5,000 Units total) by mouth daily.  . Cranberry 500 MG CAPS Take 500 mg by mouth daily.  Marland Kitchen diltiazem (CARDIZEM SR) 60 MG 12 hr capsule Take 1 capsule (60 mg total) by mouth 2 (two) times daily.  Marland Kitchen escitalopram (LEXAPRO) 5 MG tablet Take 1 tablet (5 mg total) by mouth daily.  . fluticasone (FLONASE) 50 MCG/ACT nasal spray Place 2 sprays into both nostrils daily as needed for allergies or rhinitis. Use after nasal saline  . folic acid  (FOLVITE) 400 MCG tablet Take 1 tablet (400 mcg total) by mouth daily. SEPARATE ALL SUPPLEMENTS TO LUNCH OR DINNER AND PRILOSEC NOT TO MESS W/THYROID MED  . gabapentin (NEURONTIN) 300 MG capsule 1 pill in the am and lunch and 2 pills qhs (Patient taking differently: Take 600 mg by mouth at bedtime. )  . hydrALAZINE (APRESOLINE) 10 MG tablet Take 2 tablets (  20 mg total) by mouth 2 (two) times daily as needed. BP>140/>90  . hydrochlorothiazide (MICROZIDE) 12.5 MG capsule Take 1 capsule (12.5 mg total) by mouth daily. In am  . levothyroxine (SYNTHROID) 88 MCG tablet Take 1 tablet (88 mcg total) by mouth daily before breakfast. Skip sundays  . LORazepam (ATIVAN) 0.5 MG tablet Take 1 tablet (0.5 mg total) by mouth 2 (two) times daily as needed for anxiety or sleep. Or 1 mg at night for sleep  . mirabegron ER (MYRBETRIQ) 50 MG TB24 tablet Take 1 tablet (50 mg total) by mouth daily. (Patient taking differently: Take 50 mg by mouth at bedtime. )  . montelukast (SINGULAIR) 10 MG tablet Take 1 tablet (10 mg total) by mouth at bedtime.  . naloxegol oxalate (MOVANTIK) 12.5 MG TABS tablet Take 1-2 tablets (12.5-25 mg total) by mouth daily. Constipation x 3 days prn  . omeprazole (PRILOSEC) 40 MG capsule Take 1 capsule (40 mg total) by mouth daily. (Patient taking differently: Take 40 mg by mouth daily after lunch. )  . polyethylene glycol powder (GLYCOLAX/MIRALAX) 17 GM/SCOOP powder Take 17 g by mouth daily as needed for moderate constipation.  . potassium chloride (KLOR-CON) 10 MEQ tablet Take 1 tablet (10 mEq total) by mouth daily.  . Probiotic Product (HEALTHY COLON PO) Take 1 capsule by mouth daily.  . sodium chloride (OCEAN) 0.65 % SOLN nasal spray Place 2 sprays into both nostrils as needed for congestion. Use 1st  . tiZANidine (ZANAFLEX) 4 MG tablet Take 1 tablet (4 mg total) by mouth 2 (two) times daily as needed for muscle spasms.  . traMADol (ULTRAM) 50 MG tablet Take by mouth.  . traZODone (DESYREL) 50  MG tablet Take 1-2 tablets (50-100 mg total) by mouth at bedtime as needed for sleep. 1 hr before bed (Patient taking differently: Take 100 mg by mouth at bedtime. 1 hr before bed)  . Turmeric Curcumin 500 MG CAPS Take 500 mg by mouth daily.  . Vaginal Lubricant (REPLENS) GEL Place 1 application vaginally 3 (three) times a week.  . vitamin B-12 (CYANOCOBALAMIN) 1000 MCG tablet Take 1 tablet (1,000 mcg total) by mouth daily.  . [DISCONTINUED] traMADol (ULTRAM) 50 MG tablet Take 0.5-1 tablets (25-50 mg total) by mouth every 6 (six) hours as needed for up to 5 days for moderate pain or severe pain (take 1/2 tab (25mg ) for moderate pain, 1 tab (50mg ) for severe pain).   No Known Allergies Recent Results (from the past 2160 hour(s))  SARS CORONAVIRUS 2 (TAT 6-24 HRS) Nasopharyngeal Nasopharyngeal Swab     Status: None   Collection Time: 01/27/20  9:47 AM   Specimen: Nasopharyngeal Swab  Result Value Ref Range   SARS Coronavirus 2 NEGATIVE NEGATIVE    Comment: (NOTE) SARS-CoV-2 target nucleic acids are NOT DETECTED.  The SARS-CoV-2 RNA is generally detectable in upper and lower respiratory specimens during the acute phase of infection. Negative results do not preclude SARS-CoV-2 infection, do not rule out co-infections with other pathogens, and should not be used as the sole basis for treatment or other patient management decisions. Negative results must be combined with clinical observations, patient history, and epidemiological information. The expected result is Negative.  Fact Sheet for Patients: 2161  Fact Sheet for Healthcare Providers: 01/29/20  This test is not yet approved or cleared by the [place] FDA and  has been authorized for detection and/or diagnosis of SARS-CoV-2 by FDA under an Emergency Use Authorization (EUA). This EUA will remain  in effect (meaning this test can be used) for the duration of  the COVID-19 declaration under Se ction 564(b)(1) of the Act, 21 U.S.C. section 360bbb-3(b)(1), unless the authorization is terminated or revoked sooner.  Performed at [facility], [address], [place], [place] [address]   APTT     Status: None   Collection Time: 04/19/20 11:55 AM  Result Value Ref Range   aPTT 28 24 - 36 seconds    Comment: Performed at [facility], [address] [place], [place] [address]  Basic metabolic panel     Status: None   Collection Time: 04/19/20 11:55 AM  Result Value Ref Range   Sodium 139 135 - 145 mmol/L   Potassium 3.9 3.5 - 5.1 mmol/L   Chloride 104 98 - 111 mmol/L   CO2 26 22 - 32 mmol/L   Glucose, Bld 99 70 - 99 mg/dL    Comment: Glucose reference range applies only to samples taken after fasting for at least 8 hours.   BUN 12 8 - 23 mg/dL   Creatinine, Ser 0.93 0.44 - 1.00 mg/dL   Calcium 9.1 8.9 - 81.8 mg/dL   GFR calc non Af Amer >60 >60 mL/min   GFR calc Af Amer >60 >60 mL/min   Anion gap 9 5 - 15    Comment: Performed at [facility], [address] [place], [place] [address]  CBC     Status: Abnormal   Collection Time: 04/19/20 11:55 AM  Result Value Ref Range   WBC 4.7 4.0 - 10.5 K/uL   RBC 4.06 3.87 - 5.11 MIL/uL   Hemoglobin 13.0 12.0 - 15.0 g/dL   HCT 16.9 36 - 46 %   MCV 92.1 80.0 - 100.0 fL   MCH 32.0 26.0 - 34.0 pg   MCHC 34.8 30.0 - 36.0 g/dL   RDW 67.8 93.8 - 10.1 %   Platelets 142 (L) 150 - 400 K/uL   nRBC 0.0 0.0 - 0.2 %    Comment: Performed at [facility], [address] [place], [place] [address]  Protime-INR     Status: None   Collection Time: 04/19/20 11:55 AM  Result Value Ref Range   Prothrombin Time 12.3 11.4 - 15.2 seconds   INR 1.0 0.8 - 1.2    Comment: (NOTE) INR goal varies based on device and disease states. Performed at [facility], [address], [place], [place] [address]   Surgical pcr screen     Status: None   Collection Time: 04/19/20  11:55 AM   Specimen: Nasal Mucosa; Nasal Swab  Result Value Ref Range   MRSA, PCR NEGATIVE NEGATIVE   Staphylococcus aureus NEGATIVE NEGATIVE    Comment: (NOTE) The Xpert SA Assay (FDA approved for NASAL specimens in patients 63 years of age and older), is one component of a comprehensive surveillance program. It is not intended to diagnose infection nor to guide or monitor treatment. Performed at [facility], [address] [place], [place] [address]   Type and screen     Status: None   Collection Time: 04/19/20 11:55 AM  Result Value Ref Range   ABO/RH(D) AB POS    Antibody Screen NEG    Sample Expiration 05/03/2020,2359    Extend sample reason      NO TRANSFUSIONS OR PREGNANCY IN THE PAST 3 MONTHS Performed at [facility], [address] [place], [place] [address]   Urinalysis, Routine w reflex microscopic  Status: Abnormal   Collection Time: 04/19/20 11:55 AM  Result Value Ref Range   Color, Urine YELLOW (A) YELLOW   APPearance CLOUDY (A) CLEAR   Specific Gravity, Urine 1.015 1.005 - 1.030   pH 5.0 5.0 - 8.0   Glucose, UA NEGATIVE NEGATIVE mg/dL   Hgb urine dipstick SMALL (A) NEGATIVE   Bilirubin Urine NEGATIVE NEGATIVE   Ketones, ur NEGATIVE NEGATIVE mg/dL   Protein, ur NEGATIVE NEGATIVE mg/dL   Nitrite POSITIVE (A) NEGATIVE   Leukocytes,Ua LARGE (A) NEGATIVE   RBC / HPF 6-10 0 - 5 RBC/hpf   WBC, UA >50 (H) 0 - 5 WBC/hpf   Bacteria, UA FEW (A) NONE SEEN   Squamous Epithelial / LPF 0-5 0 - 5   WBC Clumps PRESENT     Comment: Performed at [facility], [address] [place], [place] [address]  Urine Culture     Status: Abnormal   Collection Time: 04/19/20 11:55 AM   Specimen: Urine, Random  Result Value Ref Range   Specimen Description      URINE, RANDOM Performed at [facility], [address] [place], [place] [address]    Special Requests      NONE Performed at [facility], [address]  [place], [place] [address]    Culture >=100,000 COLONIES/mL ESCHERICHIA COLI (A)    Report Status 04/21/2020 FINAL    Organism ID, Bacteria ESCHERICHIA COLI (A)       Susceptibility   Escherichia coli - MIC*    AMPICILLIN >=32 RESISTANT Resistant     CEFAZOLIN <=4 SENSITIVE Sensitive     CEFTRIAXONE <=0.25 SENSITIVE Sensitive     CIPROFLOXACIN >=4 RESISTANT Resistant     GENTAMICIN <=1 SENSITIVE Sensitive     IMIPENEM <=0.25 SENSITIVE Sensitive     NITROFURANTOIN <=16 SENSITIVE Sensitive     TRIMETH/SULFA >=320 RESISTANT Resistant     AMPICILLIN/SULBACTAM 16 INTERMEDIATE Intermediate     PIP/TAZO <=4 SENSITIVE Sensitive     * >=100,000 COLONIES/mL ESCHERICHIA COLI  SARS CORONAVIRUS 2 (TAT 6-24 HRS) Nasopharyngeal Nasopharyngeal Swab     Status: None   Collection Time: 04/21/20 12:52 PM   Specimen: Nasopharyngeal Swab  Result Value Ref Range   SARS Coronavirus 2 NEGATIVE NEGATIVE    Comment: (NOTE) SARS-CoV-2 target nucleic acids are NOT DETECTED.  The SARS-CoV-2 RNA is generally detectable in upper and lower respiratory specimens during the acute phase of infection. Negative results do not preclude SARS-CoV-2 infection, do not rule out co-infections with other pathogens, and should not be used as the sole basis for treatment or other patient management decisions. Negative results must be combined with clinical observations, patient history, and epidemiological information. The expected result is Negative.  Fact Sheet for Patients: [employer]  Fact Sheet for Healthcare Providers: quierodirigir.com  This test is not yet approved or cleared by the [place] FDA and  has been authorized for detection and/or diagnosis of SARS-CoV-2 by FDA under an Emergency Use Authorization (EUA). This EUA will remain  in effect (meaning this test can be used) for the duration of the COVID-19 declaration under Se ction 564(b)(1) of the Act,  21 U.S.C. section 360bbb-3(b)(1), unless the authorization is terminated or revoked sooner.  Performed at [facility], [address] [place], [place] [address]    Objective  Body mass index is 29.87 kg/m. Wt Readings from Last 3 Encounters:  04/25/20 168 lb 9.6 oz (76.5 kg)  04/24/20 160 lb (72.6 kg)  04/19/20 170 lb 11.2 oz (77.4 kg)   Temp Readings from Last 3 Encounters:  04/25/20 98.4 F (36.9 C) (Oral)  04/24/20 (!) 97 F (36.1 C) (Temporal)  01/31/20 97.8 F (36.6 C) (Temporal)   BP Readings from Last 3 Encounters:  04/25/20 126/86  04/24/20 (!) 156/87  04/19/20 (!) 154/82   Pulse Readings from Last 3 Encounters:  04/25/20 80  04/24/20 71  04/19/20 72    Physical Exam Vitals and nursing note reviewed.  Constitutional:      Appearance: Normal appearance. She is well-developed and well-groomed. She is obese.  HENT:     Head: Normocephalic and atraumatic.  Eyes:     Conjunctiva/sclera: Conjunctivae normal.     Pupils: Pupils are equal, round, and reactive to light.  Cardiovascular:     Rate and Rhythm: Normal rate and regular rhythm.     Heart sounds: Normal heart sounds.  Pulmonary:     Effort: Pulmonary effort is normal.     Breath sounds: Normal breath sounds.  Skin:    General: Skin is warm and dry.  Neurological:     General: No focal deficit present.     Mental Status: She is alert and oriented to person, place, and time. Mental status is at baseline.     Gait: Gait normal.  Psychiatric:        Attention and Perception: Attention and perception normal.        Mood and Affect: Mood and affect normal.        Speech: Speech normal.        Behavior: Behavior normal. Behavior is cooperative.        Thought Content: Thought content normal.        Cognition and Memory: Cognition normal.        Judgment: Judgment normal.     Comments: Walking with rollator     Assessment  Plan  S/P insertion of spinal cord stimulator Sent message to Dr.  Adriana Simas who did procedure 04/24/20 dermabond incision site slightly open and redness and swelling though does not look like cellulitis appt 05/09/20 and another 05/2020 to f/u   Thrombocytopenia (HCC) - Plan: CBC with Differential/Platelet  Hypothyroidism, unspecified type - Plan: TSH Cont meds   Essential hypertension Controlled cont meds   Osteoporosis, unspecified osteoporosis type, unspecified pathological fracture presence Fosamax weekly since 07/2019  Overweight (BMI 25.0-29.9)  rec healthy diet and exercise   HM Flu shot utd9/13/21 [facility]  Tdapconsider in future unable to find record pna23 utd prevnar had 06/16/15 Rx shingrix and tdap 04/25/20   shingrix 1/2 10/18/19likely will need to repeat series should have had 2nd dose in 11/2018 Will need again  Consider hep B vaccine covid vx 2/2   A1C 5.0  colonoscopy had 06/03/11 Dr. Bosie Clos diverticulosis f/u in 10 years  mammo [facility] 9/9/20negordered due call to schedule   Out of age window pap last 01/26/10 neg   dexa 04/14/19 osteopenia with high frax scoreconsidering forteo x 75months then NS reeval for low Back surgery vs vs Tymlos vs evenity  -pt c/o cost of med as of 07/23/19 could nof afford On fosamax since 07/2019 1x per week   hcv neg Lipid had3/6/20reviewed CT chestoverdue ordered by Dr. Kendrick Fries Will need dermatology in future$ is issue currently Provider: Dr. French Ana McLean-Scocuzza-Internal Medicine

## 2020-04-25 NOTE — Anesthesia Postprocedure Evaluation (Signed)
Anesthesia Post Note  Patient: Kimberly Cook  Procedure(s) Performed: REPLACEMENT LEFT FLANK PULSE GENERATOR IMPLANT (Left )  Patient location during evaluation: PACU Anesthesia Type: MAC Level of consciousness: awake and alert Pain management: pain level controlled Vital Signs Assessment: post-procedure vital signs reviewed and stable Respiratory status: spontaneous breathing, nonlabored ventilation, respiratory function stable and patient connected to nasal cannula oxygen Cardiovascular status: blood pressure returned to baseline and stable Postop Assessment: no apparent nausea or vomiting Anesthetic complications: no   No complications documented.   Last Vitals:  Vitals:   04/24/20 1016 04/24/20 1034  BP: 112/80 (!) 156/87  Pulse: 72 71  Resp: 18 16  Temp: (!) 36.1 C   SpO2: 99% 100%    Last Pain:  Vitals:   04/24/20 1034  TempSrc:   PainSc: 0-No pain                 Molli Barrows

## 2020-04-25 NOTE — Progress Notes (Signed)
Patient had a procedure on her back yesterday. Patient states she is having sever pain at the site of the stiches. She was given Tramadol to help but this is not working for her.  Patient has not been in contact with her back surgeon today.

## 2020-04-25 NOTE — Patient Instructions (Signed)
Space vaccine out by 1 month  shingrix needed 2 doses 2nd dose 2-6 months after the 1st.

## 2020-04-26 ENCOUNTER — Encounter: Payer: Self-pay | Admitting: Neurosurgery

## 2020-04-27 ENCOUNTER — Ambulatory Visit: Payer: Medicare HMO | Admitting: Internal Medicine

## 2020-05-01 ENCOUNTER — Telehealth: Payer: Self-pay | Admitting: Internal Medicine

## 2020-05-01 NOTE — Telephone Encounter (Signed)
-----   Message from Bevelyn Buckles, MD sent at 04/25/2020 10:56 AM EDT ----- Call [facility] and see waht vaccines pt had week of 04/17/20 and log pleaseThanksTMS

## 2020-05-01 NOTE — Telephone Encounter (Signed)
Called and spoke with Brett Canales with [facility]. He states that around that time quite a few Patient's got flu shots but also had the option to get a few more shots. He states they do not have a list of what was given to the Patient. States they uses [facility] and gave me their number to call.   [facility]ed [facility] and spoke with [place]. Patient had Prevanar 13 and high dose flu shot.

## 2020-05-05 DIAGNOSIS — S93402A Sprain of unspecified ligament of left ankle, initial encounter: Secondary | ICD-10-CM | POA: Diagnosis not present

## 2020-05-30 ENCOUNTER — Other Ambulatory Visit: Payer: Medicare HMO

## 2020-06-01 ENCOUNTER — Ambulatory Visit: Payer: Medicare HMO | Admitting: Internal Medicine

## 2020-06-02 ENCOUNTER — Other Ambulatory Visit (INDEPENDENT_AMBULATORY_CARE_PROVIDER_SITE_OTHER): Payer: Medicare HMO

## 2020-06-02 ENCOUNTER — Other Ambulatory Visit: Payer: Self-pay

## 2020-06-02 DIAGNOSIS — D696 Thrombocytopenia, unspecified: Secondary | ICD-10-CM

## 2020-06-02 DIAGNOSIS — E039 Hypothyroidism, unspecified: Secondary | ICD-10-CM | POA: Diagnosis not present

## 2020-06-02 LAB — CBC WITH DIFFERENTIAL/PLATELET
Basophils Absolute: 0 10*3/uL (ref 0.0–0.1)
Basophils Relative: 0.9 % (ref 0.0–3.0)
Eosinophils Absolute: 0.1 10*3/uL (ref 0.0–0.7)
Eosinophils Relative: 2.5 % (ref 0.0–5.0)
HCT: 38.3 % (ref 36.0–46.0)
Hemoglobin: 13.2 g/dL (ref 12.0–15.0)
Lymphocytes Relative: 20.6 % (ref 12.0–46.0)
Lymphs Abs: 0.8 10*3/uL (ref 0.7–4.0)
MCHC: 34.5 g/dL (ref 30.0–36.0)
MCV: 92.8 fl (ref 78.0–100.0)
Monocytes Absolute: 0.3 10*3/uL (ref 0.1–1.0)
Monocytes Relative: 6.6 % (ref 3.0–12.0)
Neutro Abs: 2.7 10*3/uL (ref 1.4–7.7)
Neutrophils Relative %: 69.4 % (ref 43.0–77.0)
Platelets: 157 10*3/uL (ref 150.0–400.0)
RBC: 4.12 Mil/uL (ref 3.87–5.11)
RDW: 13.2 % (ref 11.5–15.5)
WBC: 3.9 10*3/uL — ABNORMAL LOW (ref 4.0–10.5)

## 2020-06-02 LAB — TSH: TSH: 2.21 u[IU]/mL (ref 0.35–4.50)

## 2020-06-13 ENCOUNTER — Other Ambulatory Visit: Payer: Self-pay

## 2020-06-13 ENCOUNTER — Telehealth: Payer: Medicare HMO | Admitting: Internal Medicine

## 2020-06-13 DIAGNOSIS — Z9889 Other specified postprocedural states: Secondary | ICD-10-CM | POA: Diagnosis not present

## 2020-06-13 DIAGNOSIS — M4184 Other forms of scoliosis, thoracic region: Secondary | ICD-10-CM | POA: Diagnosis not present

## 2020-06-13 DIAGNOSIS — M4186 Other forms of scoliosis, lumbar region: Secondary | ICD-10-CM | POA: Diagnosis not present

## 2020-06-13 DIAGNOSIS — M47816 Spondylosis without myelopathy or radiculopathy, lumbar region: Secondary | ICD-10-CM | POA: Diagnosis not present

## 2020-06-13 DIAGNOSIS — Z9689 Presence of other specified functional implants: Secondary | ICD-10-CM | POA: Diagnosis not present

## 2020-06-15 ENCOUNTER — Other Ambulatory Visit: Payer: Self-pay

## 2020-06-15 ENCOUNTER — Telehealth: Payer: Self-pay | Admitting: Internal Medicine

## 2020-06-15 ENCOUNTER — Other Ambulatory Visit: Payer: Self-pay | Admitting: Neurosurgery

## 2020-06-15 NOTE — Telephone Encounter (Signed)
Patient called to let office know that [facility] will be faxing over information. She is changing to mail order.

## 2020-06-15 NOTE — Telephone Encounter (Signed)
I have changed pharmacy in chart.

## 2020-06-19 ENCOUNTER — Encounter
Admission: RE | Admit: 2020-06-19 | Discharge: 2020-06-19 | Disposition: A | Discharge status: Home / Self Care | Payer: Medicare HMO | Source: Ambulatory Visit | Attending: Neurosurgery | Admitting: Neurosurgery

## 2020-06-19 ENCOUNTER — Other Ambulatory Visit: Payer: Self-pay

## 2020-06-19 DIAGNOSIS — Z01818 Encounter for other preprocedural examination: Secondary | ICD-10-CM | POA: Diagnosis not present

## 2020-06-19 DIAGNOSIS — I10 Essential (primary) hypertension: Secondary | ICD-10-CM | POA: Diagnosis not present

## 2020-06-19 NOTE — Patient Instructions (Addendum)
Your procedure is scheduled on:06-26-20 MONDAY Report to the Registration Desk on the 1st floor of the [facility]. To find out your arrival time, please call [phone] between 1PM - 3PM on:06-23-20 FRIDAY  REMEMBER: Instructions that are not followed completely may result in serious medical risk, up to and including death; or upon the discretion of your surgeon and anesthesiologist your surgery may need to be rescheduled.  Do not eat food after midnight the night before surgery.  No gum chewing, lozengers or hard candies.  You may however, drink CLEAR liquids up to 2 hours before you are scheduled to arrive for your surgery. Do not drink anything within 2 hours of your scheduled arrival time.  Clear liquids include: - water  - apple juice without pulp - gatorade (not RED, PURPLE, OR BLUE) - black coffee or tea (Do NOT add milk or creamers to the coffee or tea) Do NOT drink anything that is not on this list.   TAKE THESE MEDICATIONS THE MORNING OF SURGERY WITH A SIP OF WATER: -ZYRTEC (CETIRIZINE) -CARDIZEM (DILTIAZEM) -LEXAPRO (ESCITALOPRAM) -GABAPENTIN (NEURONTIN) -SYNTHROID (LEVOTHYROXINE) -MYRBETRIQ (MIRABEGRON) -PRILOSEC (OMEPRAZOLE)-take one the night before and one on the morning of surgery - helps to prevent nausea after surgery.)  -YOU MAY TAKE ATIVAN (LORAZEPAM) THE MORNING OF SURGERY IF NEEDED   Follow recommendations from Cardiologist, Pulmonologist or PCP regarding stopping Aspirin, Coumadin, Plavix, Eliquis, Pradaxa, or Pletal-DR COOK INSTRUCTED PT TO STOP ASPIRIN 7 DAYS PRIOR TO SURGERY-LAST DOSE WAS ON 06-17-20  One week prior to surgery: Stop Anti-inflammatories (NSAIDS) such as Advil, Aleve, Ibuprofen, Motrin, Naproxen, Naprosyn and Aspirin based products such as Excedrin, Goodys Powder, BC Powder-OK TO TAKE TYLENOL/TRAMADOL IF NEEDED  Stop ANY OVER THE COUNTER supplements until after surgery-STOP YOUR BIOTIN  AND TURMERIC NOW-YOU MAY RESUME AFTER YOUR  SURGERY (However, you may continue taking Vitamin D, Calcium, Potassium, Folic Acid and Probiotic up until the day before surgery.)  No Alcohol for 24 hours before or after surgery.  No Smoking including e-cigarettes for 24 hours prior to surgery.  No chewable tobacco products for at least 6 hours prior to surgery.  No nicotine patches on the day of surgery.  Do not use any "recreational" drugs for at least a week prior to your surgery.  Please be advised that the combination of cocaine and anesthesia may have negative outcomes, up to and including death. If you test positive for cocaine, your surgery will be cancelled.  On the morning of surgery brush your teeth with toothpaste and water, you may rinse your mouth with mouthwash if you wish. Do not swallow any toothpaste or mouthwash.  Do not wear jewelry, make-up, hairpins, clips or nail polish.  Do not wear lotions, powders, or perfumes.   Do not shave body from the neck down 48 hours prior to surgery just in case you cut yourself which could leave a site for infection.  Also, freshly shaved skin may become irritated if using the CHG soap.  Contact lenses, hearing aids and dentures may not be worn into surgery.  Do not bring valuables to the hospital. [facility] is not responsible for any missing/lost belongings or valuables.   Use CHG Soap directed on instruction sheet.  Notify your doctor if there is any change in your medical condition (cold, fever, infection).  Wear comfortable clothing (specific to your surgery type) to the hospital.  Plan for stool softeners for home use; pain medications have a tendency to cause constipation. You can also help  prevent constipation by eating foods high in fiber such as fruits and vegetables and drinking plenty of fluids as your diet allows.  After surgery, you can help prevent lung complications by doing breathing exercises.  Take deep breaths and cough every 1-2 hours. Your doctor may  order a device called an Incentive Spirometer to help you take deep breaths. When coughing or sneezing, hold a pillow firmly against your incision with both hands. This is called "splinting." Doing this helps protect your incision. It also decreases belly discomfort.  If you are being admitted to the hospital overnight, leave your suitcase in the car. After surgery it may be brought to your room.  If you are being discharged the day of surgery, you will not be allowed to drive home. You will need a responsible adult (18 years or older) to drive you home and stay with you that night.   If you are taking public transportation, you will need to have a responsible adult (18 years or older) with you. Please confirm with your physician that it is acceptable to use public transportation.   Please call the Pre-admissions Testing Dept. at [phone] if you have any questions about these instructions.  Visitation Policy:  Patients undergoing a surgery or procedure may have one family member or support person with them as long as that person is not COVID-19 positive or experiencing its symptoms.  That person may remain in the waiting area during the procedure.  Inpatient Visitation Update:   In an effort to ensure the safety of our team members and our patients, we are implementing a change to our visitation policy:  Effective Monday, Aug. 9, at 7 a.m., inpatients will be allowed one support person.  o The support person may change daily.  o The support person must pass our screening, gel in and out, and wear a mask at all times, including in the patient's room.  o Patients must also wear a mask when staff or their support person are in the room.  o Masking is required regardless of vaccination status.  Systemwide, no visitors 17 or younger.

## 2020-06-20 ENCOUNTER — Other Ambulatory Visit: Payer: Self-pay

## 2020-06-20 ENCOUNTER — Encounter
Admission: RE | Admit: 2020-06-20 | Discharge: 2020-06-20 | Disposition: A | Discharge status: Home / Self Care | Payer: Medicare HMO | Source: Ambulatory Visit | Attending: Neurosurgery | Admitting: Neurosurgery

## 2020-06-20 DIAGNOSIS — G4700 Insomnia, unspecified: Secondary | ICD-10-CM

## 2020-06-20 DIAGNOSIS — Z01818 Encounter for other preprocedural examination: Secondary | ICD-10-CM | POA: Diagnosis not present

## 2020-06-20 DIAGNOSIS — M419 Scoliosis, unspecified: Secondary | ICD-10-CM

## 2020-06-20 DIAGNOSIS — I10 Essential (primary) hypertension: Secondary | ICD-10-CM | POA: Diagnosis not present

## 2020-06-20 DIAGNOSIS — M5416 Radiculopathy, lumbar region: Secondary | ICD-10-CM

## 2020-06-20 DIAGNOSIS — M47816 Spondylosis without myelopathy or radiculopathy, lumbar region: Secondary | ICD-10-CM

## 2020-06-20 DIAGNOSIS — M5030 Other cervical disc degeneration, unspecified cervical region: Secondary | ICD-10-CM

## 2020-06-20 DIAGNOSIS — E782 Mixed hyperlipidemia: Secondary | ICD-10-CM

## 2020-06-20 LAB — URINALYSIS, ROUTINE W REFLEX MICROSCOPIC
Bilirubin Urine: NEGATIVE
Glucose, UA: NEGATIVE mg/dL
Ketones, ur: NEGATIVE mg/dL
Nitrite: POSITIVE — AB
Protein, ur: NEGATIVE mg/dL
Specific Gravity, Urine: 1.016 (ref 1.005–1.030)
WBC, UA: >50 WBC/hpf — ABNORMAL HIGH (ref 0–5)
pH: 5 (ref 5.0–8.0)

## 2020-06-20 LAB — SURGICAL PCR SCREEN
MRSA, PCR: NEGATIVE
Staphylococcus aureus: NEGATIVE

## 2020-06-20 LAB — TYPE AND SCREEN
ABO/RH(D): AB POS
Antibody Screen: NEGATIVE

## 2020-06-20 LAB — BASIC METABOLIC PANEL
Anion gap: 10 (ref 5–15)
BUN: 13 mg/dL (ref 8–23)
CO2: 25 mmol/L (ref 22–32)
Calcium: 9 mg/dL (ref 8.9–10.3)
Chloride: 103 mmol/L (ref 98–111)
Creatinine, Ser: 0.87 mg/dL (ref 0.44–1.00)
GFR, Estimated: >60 mL/min (ref >60)
Glucose, Bld: 98 mg/dL (ref 70–99)
Potassium: 4 mmol/L (ref 3.5–5.1)
Sodium: 138 mmol/L (ref 135–145)

## 2020-06-20 LAB — PROTIME-INR
INR: 1 (ref 0.8–1.2)
Prothrombin Time: 12.9 seconds (ref 11.4–15.2)

## 2020-06-20 LAB — APTT: aPTT: 28 seconds (ref 24–36)

## 2020-06-20 MED ORDER — ATORVASTATIN CALCIUM 20 MG PO TABS: 20.0000 mg | ORAL_TABLET | Freq: Every day | ORAL | 3 refills | Status: DC

## 2020-06-20 MED ORDER — TRAZODONE HCL 50 MG PO TABS: 100.0000 mg | ORAL_TABLET | Freq: Every day | ORAL | 1 refills | Status: DC

## 2020-06-20 MED ORDER — TIZANIDINE HCL 4 MG PO TABS: 4.0000 mg | ORAL_TABLET | Freq: Two times a day (BID) | ORAL | 5 refills | Status: DC | PRN

## 2020-06-20 NOTE — Progress Notes (Signed)
Back pain s/p spinal stimulator  pt cancelled appt and f/u NS instead  Dr. French Ana McLean-Scocuzza

## 2020-06-21 ENCOUNTER — Telehealth: Payer: Self-pay | Admitting: Internal Medicine

## 2020-06-21 NOTE — Telephone Encounter (Signed)
[employer] called and they need new rx for the following sent to [facility] Mail Delivery potassium chloride (KLOR-CON) 10 MEQ tablet escitalopram (LEXAPRO) 5 MG tablet traMADol (ULTRAM) 50 MG tablet diltiazem (CARDIZEM SR) 60 MG 12 hr capsule omeprazole (PRILOSEC) 40 MG capsule LORazepam (ATIVAN) 0.5 MG tablet

## 2020-06-23 ENCOUNTER — Other Ambulatory Visit
Admission: RE | Admit: 2020-06-23 | Discharge: 2020-06-23 | Disposition: A | Discharge status: Home / Self Care | Payer: Medicare HMO | Source: Ambulatory Visit | Attending: Neurosurgery | Admitting: Neurosurgery

## 2020-06-23 ENCOUNTER — Other Ambulatory Visit: Payer: Self-pay | Admitting: Internal Medicine

## 2020-06-23 ENCOUNTER — Other Ambulatory Visit: Payer: Self-pay

## 2020-06-23 DIAGNOSIS — F32A Depression, unspecified: Secondary | ICD-10-CM

## 2020-06-23 DIAGNOSIS — Z20822 Contact with and (suspected) exposure to covid-19: Secondary | ICD-10-CM | POA: Insufficient documentation

## 2020-06-23 DIAGNOSIS — R002 Palpitations: Secondary | ICD-10-CM

## 2020-06-23 DIAGNOSIS — G4700 Insomnia, unspecified: Secondary | ICD-10-CM

## 2020-06-23 DIAGNOSIS — Z01812 Encounter for preprocedural laboratory examination: Secondary | ICD-10-CM | POA: Diagnosis not present

## 2020-06-23 DIAGNOSIS — I10 Essential (primary) hypertension: Secondary | ICD-10-CM

## 2020-06-23 DIAGNOSIS — K219 Gastro-esophageal reflux disease without esophagitis: Secondary | ICD-10-CM

## 2020-06-23 DIAGNOSIS — F419 Anxiety disorder, unspecified: Secondary | ICD-10-CM

## 2020-06-23 DIAGNOSIS — E876 Hypokalemia: Secondary | ICD-10-CM

## 2020-06-23 LAB — SARS CORONAVIRUS 2 (TAT 6-24 HRS): SARS Coronavirus 2: NEGATIVE

## 2020-06-23 MED ORDER — DILTIAZEM HCL ER 60 MG PO CP12: 60.0000 mg | ORAL_CAPSULE | Freq: Two times a day (BID) | ORAL | 3 refills | Status: DC

## 2020-06-23 MED ORDER — ESCITALOPRAM OXALATE 5 MG PO TABS: 5.0000 mg | ORAL_TABLET | ORAL | 3 refills | Status: DC

## 2020-06-23 MED ORDER — POTASSIUM CHLORIDE ER 10 MEQ PO TBCR: 10.0000 meq | EXTENDED_RELEASE_TABLET | Freq: Every day | ORAL | 3 refills | Status: DC

## 2020-06-23 MED ORDER — OMEPRAZOLE 40 MG PO CPDR: 40.0000 mg | DELAYED_RELEASE_CAPSULE | Freq: Every day | ORAL | 3 refills | Status: DC

## 2020-06-23 MED ORDER — LORAZEPAM 0.5 MG PO TABS: 0.5000 mg | ORAL_TABLET | Freq: Two times a day (BID) | ORAL | 5 refills | Status: DC | PRN

## 2020-06-23 NOTE — Pre-Procedure Instructions (Signed)
UA results sent to Dr. Patsey Berthold office for review.

## 2020-06-23 NOTE — Telephone Encounter (Signed)
I do not Rx Tramadol but pain clinic appt is not until 08/08/20  Dr. Adriana Simas can you bridge with tramadol until pain clinic appt?

## 2020-06-25 MED ORDER — LACTATED RINGERS IV SOLN: INTRAVENOUS | Status: DC

## 2020-06-25 MED ORDER — CHLORHEXIDINE GLUCONATE 0.12 % MT SOLN
15.0000 mL | Freq: Once | OROMUCOSAL | Status: AC
  Administered 2020-06-26: 15 mL via OROMUCOSAL

## 2020-06-25 MED ORDER — ORAL CARE MOUTH RINSE: 15.0000 mL | Freq: Once | OROMUCOSAL | Status: AC

## 2020-06-26 ENCOUNTER — Ambulatory Visit: Payer: Medicare HMO | Admitting: Certified Registered"

## 2020-06-26 ENCOUNTER — Ambulatory Visit
Admission: RE | Admit: 2020-06-26 | Discharge: 2020-06-26 | Disposition: A | Discharge status: Home / Self Care | Payer: Medicare HMO | Attending: Neurosurgery | Admitting: Neurosurgery

## 2020-06-26 ENCOUNTER — Encounter: Payer: Self-pay | Admitting: Neurosurgery

## 2020-06-26 ENCOUNTER — Encounter
Admission: RE | Disposition: A | Discharge status: Home / Self Care | Payer: Self-pay | Source: Home / Self Care | Attending: Neurosurgery

## 2020-06-26 ENCOUNTER — Other Ambulatory Visit: Payer: Self-pay

## 2020-06-26 DIAGNOSIS — R7303 Prediabetes: Secondary | ICD-10-CM | POA: Diagnosis not present

## 2020-06-26 DIAGNOSIS — G894 Chronic pain syndrome: Secondary | ICD-10-CM | POA: Insufficient documentation

## 2020-06-26 DIAGNOSIS — E039 Hypothyroidism, unspecified: Secondary | ICD-10-CM | POA: Insufficient documentation

## 2020-06-26 DIAGNOSIS — Z833 Family history of diabetes mellitus: Secondary | ICD-10-CM | POA: Diagnosis not present

## 2020-06-26 DIAGNOSIS — Z8249 Family history of ischemic heart disease and other diseases of the circulatory system: Secondary | ICD-10-CM | POA: Diagnosis not present

## 2020-06-26 DIAGNOSIS — T85192A Other mechanical complication of implanted electronic neurostimulator (electrode) of spinal cord, initial encounter: Secondary | ICD-10-CM | POA: Diagnosis not present

## 2020-06-26 DIAGNOSIS — Z79899 Other long term (current) drug therapy: Secondary | ICD-10-CM | POA: Diagnosis not present

## 2020-06-26 DIAGNOSIS — Z8673 Personal history of transient ischemic attack (TIA), and cerebral infarction without residual deficits: Secondary | ICD-10-CM | POA: Diagnosis not present

## 2020-06-26 DIAGNOSIS — I2510 Atherosclerotic heart disease of native coronary artery without angina pectoris: Secondary | ICD-10-CM | POA: Insufficient documentation

## 2020-06-26 DIAGNOSIS — X58XXXA Exposure to other specified factors, initial encounter: Secondary | ICD-10-CM | POA: Insufficient documentation

## 2020-06-26 DIAGNOSIS — N183 Chronic kidney disease, stage 3 unspecified: Secondary | ICD-10-CM | POA: Insufficient documentation

## 2020-06-26 DIAGNOSIS — M5136 Other intervertebral disc degeneration, lumbar region: Secondary | ICD-10-CM | POA: Insufficient documentation

## 2020-06-26 DIAGNOSIS — Z9682 Presence of neurostimulator: Secondary | ICD-10-CM | POA: Diagnosis not present

## 2020-06-26 DIAGNOSIS — I25119 Atherosclerotic heart disease of native coronary artery with unspecified angina pectoris: Secondary | ICD-10-CM | POA: Insufficient documentation

## 2020-06-26 DIAGNOSIS — Z803 Family history of malignant neoplasm of breast: Secondary | ICD-10-CM | POA: Insufficient documentation

## 2020-06-26 DIAGNOSIS — Z7982 Long term (current) use of aspirin: Secondary | ICD-10-CM | POA: Diagnosis not present

## 2020-06-26 DIAGNOSIS — Z87440 Personal history of urinary (tract) infections: Secondary | ICD-10-CM | POA: Diagnosis not present

## 2020-06-26 DIAGNOSIS — T85840A Pain due to nervous system prosthetic devices, implants and grafts, initial encounter: Secondary | ICD-10-CM | POA: Insufficient documentation

## 2020-06-26 DIAGNOSIS — I129 Hypertensive chronic kidney disease with stage 1 through stage 4 chronic kidney disease, or unspecified chronic kidney disease: Secondary | ICD-10-CM | POA: Diagnosis not present

## 2020-06-26 DIAGNOSIS — Z4542 Encounter for adjustment and management of neuropacemaker (brain) (peripheral nerve) (spinal cord): Secondary | ICD-10-CM | POA: Diagnosis not present

## 2020-06-26 DIAGNOSIS — K219 Gastro-esophageal reflux disease without esophagitis: Secondary | ICD-10-CM | POA: Insufficient documentation

## 2020-06-26 DIAGNOSIS — T85193A Other mechanical complication of implanted electronic neurostimulator, generator, initial encounter: Secondary | ICD-10-CM | POA: Diagnosis not present

## 2020-06-26 DIAGNOSIS — E782 Mixed hyperlipidemia: Secondary | ICD-10-CM | POA: Diagnosis not present

## 2020-06-26 HISTORY — PX: SPINAL CORD STIMULATOR REMOVAL: SHX5379

## 2020-06-26 SURGERY — LUMBAR SPINAL CORD STIMULATOR REMOVAL
Anesthesia: General

## 2020-06-26 MED ORDER — ONDANSETRON HCL 4 MG/2ML IJ SOLN
INTRAMUSCULAR | Status: DC | PRN
  Administered 2020-06-26 (×2): 4 mg via INTRAVENOUS

## 2020-06-26 MED ORDER — SUGAMMADEX SODIUM 200 MG/2ML IV SOLN
INTRAVENOUS | Status: DC | PRN
  Administered 2020-06-26: 150 mg via INTRAVENOUS

## 2020-06-26 MED ORDER — BUPIVACAINE-EPINEPHRINE (PF) 0.5% -1:200000 IJ SOLN
INTRAMUSCULAR | Status: AC
  Filled 2020-06-26: qty 30

## 2020-06-26 MED ORDER — THROMBIN 5000 UNITS EX SOLR
CUTANEOUS | Status: AC
  Filled 2020-06-26: qty 5000

## 2020-06-26 MED ORDER — FENTANYL CITRATE (PF) 100 MCG/2ML IJ SOLN
INTRAMUSCULAR | Status: AC
  Filled 2020-06-26: qty 2

## 2020-06-26 MED ORDER — TRAMADOL HCL 50 MG PO TABS: 50.0000 mg | ORAL_TABLET | Freq: Four times a day (QID) | ORAL | 0 refills | Status: AC | PRN

## 2020-06-26 MED ORDER — GLYCOPYRROLATE 0.2 MG/ML IJ SOLN
INTRAMUSCULAR | Status: DC | PRN
  Administered 2020-06-26: .2 mg via INTRAVENOUS

## 2020-06-26 MED ORDER — ONDANSETRON HCL 4 MG/2ML IJ SOLN: 4.0000 mg | Freq: Once | INTRAMUSCULAR | Status: DC | PRN

## 2020-06-26 MED ORDER — CEFAZOLIN SODIUM-DEXTROSE 2-4 GM/100ML-% IV SOLN
2.0000 g | INTRAVENOUS | Status: AC
  Administered 2020-06-26: 2 g via INTRAVENOUS

## 2020-06-26 MED ORDER — LIDOCAINE HCL (CARDIAC) PF 100 MG/5ML IV SOSY
PREFILLED_SYRINGE | INTRAVENOUS | Status: DC | PRN
  Administered 2020-06-26: 100 mg via INTRAVENOUS

## 2020-06-26 MED ORDER — FENTANYL CITRATE (PF) 100 MCG/2ML IJ SOLN
INTRAMUSCULAR | Status: DC | PRN
  Administered 2020-06-26 (×2): 50 ug via INTRAVENOUS

## 2020-06-26 MED ORDER — ACETAMINOPHEN 10 MG/ML IV SOLN
INTRAVENOUS | Status: DC | PRN
  Administered 2020-06-26: 1000 mg via INTRAVENOUS

## 2020-06-26 MED ORDER — PHENYLEPHRINE HCL (PRESSORS) 10 MG/ML IV SOLN
INTRAVENOUS | Status: DC | PRN
  Administered 2020-06-26 (×2): 200 ug via INTRAVENOUS

## 2020-06-26 MED ORDER — BUPIVACAINE-EPINEPHRINE (PF) 0.5% -1:200000 IJ SOLN
INTRAMUSCULAR | Status: DC | PRN
  Administered 2020-06-26: 9 mL

## 2020-06-26 MED ORDER — MIDAZOLAM HCL 2 MG/2ML IJ SOLN
INTRAMUSCULAR | Status: AC
  Filled 2020-06-26: qty 2

## 2020-06-26 MED ORDER — ACETAMINOPHEN 10 MG/ML IV SOLN
INTRAVENOUS | Status: AC
  Filled 2020-06-26: qty 100

## 2020-06-26 MED ORDER — DEXAMETHASONE SODIUM PHOSPHATE 10 MG/ML IJ SOLN
INTRAMUSCULAR | Status: DC | PRN
  Administered 2020-06-26: 10 mg via INTRAVENOUS

## 2020-06-26 MED ORDER — REMIFENTANIL HCL 1 MG IV SOLR
INTRAVENOUS | Status: AC
  Filled 2020-06-26: qty 1000

## 2020-06-26 MED ORDER — TRAMADOL HCL 50 MG PO TABS: 50.0000 mg | ORAL_TABLET | Freq: Once | ORAL | Status: AC

## 2020-06-26 MED ORDER — FENTANYL CITRATE (PF) 100 MCG/2ML IJ SOLN: 25.0000 ug | INTRAMUSCULAR | Status: DC | PRN

## 2020-06-26 MED ORDER — TRAMADOL HCL 50 MG PO TABS
ORAL_TABLET | ORAL | Status: AC
  Administered 2020-06-26: 50 mg via ORAL
  Filled 2020-06-26: qty 1

## 2020-06-26 MED ORDER — FENTANYL CITRATE (PF) 100 MCG/2ML IJ SOLN
INTRAMUSCULAR | Status: AC
  Administered 2020-06-26: 25 ug via INTRAVENOUS
  Filled 2020-06-26: qty 2

## 2020-06-26 MED ORDER — CHLORHEXIDINE GLUCONATE 0.12 % MT SOLN
OROMUCOSAL | Status: AC
  Filled 2020-06-26: qty 15

## 2020-06-26 MED ORDER — PROPOFOL 10 MG/ML IV BOLUS
INTRAVENOUS | Status: DC | PRN
  Administered 2020-06-26: 150 mg via INTRAVENOUS

## 2020-06-26 MED ORDER — PROPOFOL 10 MG/ML IV BOLUS
INTRAVENOUS | Status: AC
  Filled 2020-06-26: qty 20

## 2020-06-26 MED ORDER — DEXMEDETOMIDINE (PRECEDEX) IN NS 20 MCG/5ML (4 MCG/ML) IV SYRINGE
PREFILLED_SYRINGE | INTRAVENOUS | Status: DC | PRN
  Administered 2020-06-26: 8 ug via INTRAVENOUS

## 2020-06-26 MED ORDER — FENTANYL CITRATE (PF) 250 MCG/5ML IJ SOLN
INTRAMUSCULAR | Status: AC
  Filled 2020-06-26: qty 5

## 2020-06-26 MED ORDER — CEFAZOLIN SODIUM-DEXTROSE 2-4 GM/100ML-% IV SOLN
INTRAVENOUS | Status: AC
  Filled 2020-06-26: qty 100

## 2020-06-26 MED ORDER — ROCURONIUM BROMIDE 100 MG/10ML IV SOLN
INTRAVENOUS | Status: DC | PRN
  Administered 2020-06-26: 40 mg via INTRAVENOUS

## 2020-06-26 MED ORDER — SUCCINYLCHOLINE CHLORIDE 20 MG/ML IJ SOLN
INTRAMUSCULAR | Status: DC | PRN
  Administered 2020-06-26: 100 mg via INTRAVENOUS

## 2020-06-26 SURGICAL SUPPLY — 69 items
ADH SKN CLS APL DERMABOND .7 (GAUZE/BANDAGES/DRESSINGS) ×1
AGENT HMST MTR 8 SURGIFLO (HEMOSTASIS)
APL PRP STRL LF DISP 70% ISPRP (MISCELLANEOUS) ×1
APL SRG 60D 8 XTD TIP BNDBL (TIP)
BLADE BOVIE TIP EXT 4 (BLADE) ×2 IMPLANT
BUR NEURO DRILL SOFT 3.0X3.8M (BURR) ×2 IMPLANT
CANISTER SUCT 1200ML W/VALVE (MISCELLANEOUS) ×4 IMPLANT
CHLORAPREP W/TINT 26 (MISCELLANEOUS) ×2 IMPLANT
CNTNR SPEC 2.5X3XGRAD LEK (MISCELLANEOUS) ×1
CONT SPEC 4OZ STER OR WHT (MISCELLANEOUS) ×1
CONT SPEC 4OZ STRL OR WHT (MISCELLANEOUS) ×1
CONTAINER SPEC 2.5X3XGRAD LEK (MISCELLANEOUS) ×1 IMPLANT
COUNTER NEEDLE 20/40 LG (NEEDLE) ×2 IMPLANT
COVER LIGHT HANDLE STERIS (MISCELLANEOUS) ×4 IMPLANT
COVER WAND RF STERILE (DRAPES) ×2 IMPLANT
CUP MEDICINE 2OZ PLAST GRAD ST (MISCELLANEOUS) ×2 IMPLANT
DERMABOND ADVANCED (GAUZE/BANDAGES/DRESSINGS) ×1
DERMABOND ADVANCED .7 DNX12 (GAUZE/BANDAGES/DRESSINGS) ×1 IMPLANT
DRAPE C-ARM XRAY 36X54 (DRAPES) IMPLANT
DRAPE LAPAROTOMY 100X77 ABD (DRAPES) ×2 IMPLANT
DRAPE MICROSCOPE SPINE 48X150 (DRAPES) IMPLANT
DRAPE SURG 17X11 SM STRL (DRAPES) ×2 IMPLANT
DRSG TEGADERM 4X4.75 (GAUZE/BANDAGES/DRESSINGS) ×4 IMPLANT
DRSG TEGADERM 6X8 (GAUZE/BANDAGES/DRESSINGS) ×2 IMPLANT
DRSG TELFA 3X8 NADH (GAUZE/BANDAGES/DRESSINGS) ×4 IMPLANT
DURASEAL APPLICATOR TIP (TIP) IMPLANT
DURASEAL SPINE SEALANT 3ML (MISCELLANEOUS) IMPLANT
ELECT CAUTERY BLADE TIP 2.5 (TIP) ×2
ELECT EZSTD 165MM 6.5IN (MISCELLANEOUS) ×2
ELECT REM PT RETURN 9FT ADLT (ELECTROSURGICAL) ×2
ELECTRODE CAUTERY BLDE TIP 2.5 (TIP) ×1 IMPLANT
ELECTRODE EZSTD 165MM 6.5IN (MISCELLANEOUS) ×1 IMPLANT
ELECTRODE REM PT RTRN 9FT ADLT (ELECTROSURGICAL) ×1 IMPLANT
GAUZE SPONGE 4X4 12PLY STRL (GAUZE/BANDAGES/DRESSINGS) IMPLANT
GLOVE BIOGEL PI IND STRL 7.0 (GLOVE) ×1 IMPLANT
GLOVE BIOGEL PI IND STRL 8 (GLOVE) ×1 IMPLANT
GLOVE BIOGEL PI INDICATOR 7.0 (GLOVE) ×1
GLOVE BIOGEL PI INDICATOR 8 (GLOVE) ×1
GLOVE SURG SYN 7.0 (GLOVE) ×4 IMPLANT
GLOVE SURG SYN 8.0 (GLOVE) ×2 IMPLANT
GOWN STRL REUS W/ TWL LRG LVL3 (GOWN DISPOSABLE) ×1 IMPLANT
GOWN STRL REUS W/ TWL XL LVL3 (GOWN DISPOSABLE) ×2 IMPLANT
GOWN STRL REUS W/TWL LRG LVL3 (GOWN DISPOSABLE) ×2
GOWN STRL REUS W/TWL XL LVL3 (GOWN DISPOSABLE) ×4
GRADUATE 1200CC STRL 31836 (MISCELLANEOUS) ×2 IMPLANT
KIT TURNOVER KIT A (KITS) ×2 IMPLANT
KIT WILSON FRAME (KITS) ×2 IMPLANT
MANIFOLD NEPTUNE II (INSTRUMENTS) ×2 IMPLANT
MARKER SKIN DUAL TIP RULER LAB (MISCELLANEOUS) ×2 IMPLANT
NDL SAFETY ECLIPSE 18X1.5 (NEEDLE) ×1 IMPLANT
NEEDLE HYPO 18GX1.5 SHARP (NEEDLE) ×2
NEEDLE HYPO 22GX1.5 SAFETY (NEEDLE) ×2 IMPLANT
NS IRRIG 1000ML POUR BTL (IV SOLUTION) ×2 IMPLANT
PACK LAMINECTOMY NEURO (CUSTOM PROCEDURE TRAY) ×2 IMPLANT
PAD ARMBOARD 7.5X6 YLW CONV (MISCELLANEOUS) ×2 IMPLANT
SPOGE SURGIFLO 8M (HEMOSTASIS)
SPONGE SURGIFLO 8M (HEMOSTASIS) IMPLANT
STAPLER SKIN PROX 35W (STAPLE) IMPLANT
SUT ETHILON 3-0 FS-10 30 BLK (SUTURE) ×4
SUT POLYSORB 2-0 5X18 GS-10 (SUTURE) ×18 IMPLANT
SUT VIC AB 0 CT1 18XCR BRD 8 (SUTURE) ×2 IMPLANT
SUT VIC AB 0 CT1 8-18 (SUTURE) ×4
SUTURE EHLN 3-0 FS-10 30 BLK (SUTURE) ×2 IMPLANT
SYR 10ML LL (SYRINGE) ×4 IMPLANT
SYR 20ML LL LF (SYRINGE) ×2 IMPLANT
SYR 30ML LL (SYRINGE) ×4 IMPLANT
SYR 3ML LL SCALE MARK (SYRINGE) ×2 IMPLANT
TOWEL OR 17X26 4PK STRL BLUE (TOWEL DISPOSABLE) ×4 IMPLANT
TUBING CONNECTING 10 (TUBING) ×2 IMPLANT

## 2020-06-26 NOTE — Telephone Encounter (Signed)
Pt called back returning your call °

## 2020-06-26 NOTE — Anesthesia Preprocedure Evaluation (Signed)
Anesthesia Evaluation  Patient identified by MRN, date of birth, ID band Patient awake    Reviewed: Allergy & Precautions, H&P , NPO status , Patient's Chart, lab work & pertinent test results, reviewed documented beta blocker date and time   Airway Mallampati: II  TM Distance: >3 FB Neck ROM: full    Dental  (+) Teeth Intact   Pulmonary shortness of breath, pneumonia, resolved,    Pulmonary exam normal        Cardiovascular Exercise Tolerance: Poor hypertension, + angina with exertion + CAD  Normal cardiovascular exam Rhythm:regular Rate:Normal     Neuro/Psych  Headaches, Anxiety Depression  Neuromuscular disease CVA, No Residual Symptoms negative psych ROS   GI/Hepatic Neg liver ROS, hiatal hernia, GERD  Medicated,  Endo/Other  Hypothyroidism   Renal/GU CRFRenal disease  negative genitourinary   Musculoskeletal   Abdominal   Peds  Hematology negative hematology ROS (+)   Anesthesia Other Findings Past Medical History: No date: Allergy No date: Anxiety No date: Arthritis No date: Back pain No date: Chronic kidney disease     Comment:  STAGE 3 04/2017: Coronary artery disease     Comment:  Mild to moderate CAD in LAD/diagonal by CTA (CT-FFR of               apical LAD 0.79). No date: DDD (degenerative disc disease), lumbar No date: Depression No date: Diverticulosis No date: Essential hypertension     Comment:  Normal cardiolite 05/2006 EF 71% No date: GERD (gastroesophageal reflux disease) No date: Headache No date: History of shingles No date: Hyperlipidemia No date: Hypothyroidism No date: Lung nodule 2011 : Mini stroke ([place]) No date: Occipital neuralgia No date: Palpitations 2018: Pneumonia No date: Prediabetes No date: Stroke [facility]) No date: Stroke [facility])     Comment:  MRI 04/2008 + left sup. frontal gyrus possibly puntate               infarct  2019: Syncope No date: Urinary tract  infection No date: Vitamin D deficiency Past Surgical History: No date: ABDOMINAL HYSTERECTOMY No date: BLADDER SURGERY     Comment:  2003 Over 20 years : BREAST EXCISIONAL BIOPSY; Right     Comment:  Benign No date: CHOLECYSTECTOMY No date: gastroplication  No date: JOINT REPLACEMENT; Left     Comment:  KNEE 2011: KNEE ARTHROSCOPY; Left 01/31/2020: PULSE GENERATOR IMPLANT; Left     Comment:  Procedure: PLACEMENT RIGHT FLANK PULSE GENERATOR VS               REMOVAL SPINAL CORD STIMULATOR;  Surgeon: Deetta Perla,               MD;  Location: [facility] ORS;  Service: Neurosurgery;                Laterality: Left; 04/24/2020: PULSE GENERATOR IMPLANT; Left     Comment:  Procedure: REPLACEMENT LEFT FLANK PULSE GENERATOR               IMPLANT;  Surgeon: Deetta Perla, MD;  Location: [facility] ORS;              Service: Neurosurgery;  Laterality: Left;  MAC w/ local No date: right arm fracture 01/24/2020: THORACIC LAMINECTOMY FOR SPINAL CORD STIMULATOR; N/A     Comment:  Procedure: THORACIC SPINAL CORD STIMULATOR PADDLE TRIAL               VIA LAMINECTOMY;  Surgeon: Deetta Perla, MD;  Location:  [facility] ORS;  Service: Neurosurgery;  Laterality: N/A; No date: TONSILLECTOMY AND ADENOIDECTOMY 04/17/2015: TOTAL KNEE ARTHROPLASTY; Left     Comment:  Procedure: LEFT TOTAL KNEE ARTHROPLASTY;  Surgeon:               Paralee Cancel, MD;  Location: [facility] ORS;  Service:               Orthopedics;  Laterality: Left; BMI    Body Mass Index: 28.34 kg/m     Reproductive/Obstetrics negative OB ROS                             Anesthesia Physical Anesthesia Plan  ASA: III  Anesthesia Plan: General ETT and General   Post-op Pain Management:    Induction:   PONV Risk Score and Plan: 4 or greater  Airway Management Planned:   Additional Equipment:   Intra-op Plan:   Post-operative Plan:   Informed Consent: I have reviewed the patients History and Physical, chart, labs  and discussed the procedure including the risks, benefits and alternatives for the proposed anesthesia with the patient or authorized representative who has indicated his/her understanding and acceptance.     Dental Advisory Given  Plan Discussed with: CRNA  Anesthesia Plan Comments:         Anesthesia Quick Evaluation

## 2020-06-26 NOTE — Progress Notes (Signed)
Patient was able to urinate 150 cc more and I was confident that she will be able to go home and urinate on her own in her own environment.

## 2020-06-26 NOTE — Discharge Instructions (Signed)

## 2020-06-26 NOTE — Op Note (Signed)
Operative Note  SURGERY DATE:06/26/2020  PRE-OP DIAGNOSIS: Chronic Pain Syndrome(G89.4)  POST-OP DIAGNOSIS:Chronic Pain Syndrome(G89.4)  Procedure(s) with comments: Removal Spinal Cord Stimulator Paddle via Laminectomy and Left Flank Pulse Generator   SURGEON:  * Nathaniel Man, MD Patsey Berthold, NP, Assistant  ANESTHESIA:IV anesthetic, local  OPERATIVE FINDINGS:Failed SCS with painful implant  Indications: Ms. Knack underwent a SCS placement earlier this year but has had difficulty with pain relief and at this point verbalizes no significant benefit and the device is painful in its location. Given this, she wished to proceed with removal.The risks including hematoma, infection, damage to spinal cord were discussed. The patient elected to proceed with thesurgery  Procedure: The patient was brought to the operating room and placedproneon a Wilson frame after intuabtion. The anesthesia service had vascular access and provided general anesthesia to the patient.Antibiotic was given. Theleftflank and thoracic incisions were prepped and draped in a sterile fashion. A hard timeout was performed. Local anesthetic was instilled into the incisions.  The thoracic incision was opened sharply and the wires identified. These were followed until going under the lamina. The wires were cut. Next, the drill was used to remove small portion of lamina above to expose scar tissue. This was incised sharply and the paddle dissected from scar tissue. The paddle was then removed in caudal direction intact.   The left flank incisionwas openedsharply to expose the wires and the pulse generator was removed. The leads were seen to be intact and connected.   Hemostasis was obtained and the incisions irrigated with saline.   The deep tissue was closed with 0 Vicryl and subcutaneous with2-0 Vicryl. The skin was closed with3-0 Nylon. A dressing was  applied.Patient was turned to the supine position and sedation medications were stopped and the patient was awoken from anesthesia. Patient was taken to the PACU for further recovery and seen to be at neurologic baseline. I discussed the results with the family     ESTIMATED BLOOD LOSS: 20cc     I performed the case in its entiretywith assistance ofChristine Zdeb, NP  Lucy Chris, MD [phone]

## 2020-06-26 NOTE — Transfer of Care (Signed)
Immediate Anesthesia Transfer of Care Note  Patient: Kimberly Cook  Procedure(s) Performed: SPINAL CORD STIMULATOR REMOVAL (N/A )  Patient Location: PACU  Anesthesia Type:General  Level of Consciousness: awake and patient cooperative  Airway & Oxygen Therapy: Patient Spontanous Breathing and Patient connected to face mask oxygen  Post-op Assessment: Report given to RN and Post -op Vital signs reviewed and stable  Post vital signs: Reviewed and stable  Last Vitals:  Vitals Value Taken Time  BP    Temp    Pulse 75 06/26/20 0856  Resp    SpO2 99 % 06/26/20 0856  Vitals shown include unvalidated device data.  Last Pain:  Vitals:   06/26/20 0629  TempSrc: Tympanic  PainSc: 7          Complications: No complications documented.

## 2020-06-26 NOTE — H&P (Signed)
Kimberly Cook is an 74 y.o. female.   Chief Complaint: Chronic Pain HPI: Kimberly Cook called clinic yesterday stating that she was having increased pain and felt that the spinal cord stimulator wires were "poking out of her back". I advised her to come in and be seen in order to evaluate her. She last saw Dr. Lacinda Axon on 05/30/2020 and appeared to be doing well. However, she says that since that time she has had severe left-sided back pain, along the site of where the stimulator leads are tunneled. She also reports that the IPG feels like it is "flipping" in her back and she is very frustrated with this.   Past Medical History:  Diagnosis Date  . Allergy   . Anxiety   . Arthritis   . Back pain   . Chronic kidney disease    STAGE 3  . Coronary artery disease 04/2017   Mild to moderate CAD in LAD/diagonal by CTA (CT-FFR of apical LAD 0.79).  . DDD (degenerative disc disease), lumbar   . Depression   . Diverticulosis   . Essential hypertension    Normal cardiolite 05/2006 EF 71%  . GERD (gastroesophageal reflux disease)   . Headache   . History of shingles   . Hyperlipidemia   . Hypothyroidism   . Lung nodule   . Mini stroke ([place]) 2011   . Occipital neuralgia   . Palpitations   . Pneumonia 2018  . Prediabetes   . Stroke ([place])   . Stroke [facility])    MRI 04/2008 + left sup. frontal gyrus possibly puntate infarct   . Syncope 2019  . Urinary tract infection   . Vitamin D deficiency     Past Surgical History:  Procedure Laterality Date  . ABDOMINAL HYSTERECTOMY    . BLADDER SURGERY     2003  . BREAST EXCISIONAL BIOPSY Right Over 20 years    Benign  . CHOLECYSTECTOMY    . gastroplication     . JOINT REPLACEMENT Left    KNEE  . KNEE ARTHROSCOPY Left 2011  . PULSE GENERATOR IMPLANT Left 01/31/2020   Procedure: PLACEMENT RIGHT FLANK PULSE GENERATOR VS REMOVAL SPINAL CORD STIMULATOR;  Surgeon: Deetta Perla, MD;  Location: [facility] ORS;  Service: Neurosurgery;  Laterality: Left;  .  PULSE GENERATOR IMPLANT Left 04/24/2020   Procedure: REPLACEMENT LEFT FLANK PULSE GENERATOR IMPLANT;  Surgeon: Deetta Perla, MD;  Location: [facility] ORS;  Service: Neurosurgery;  Laterality: Left;  MAC w/ local  . right arm fracture    . THORACIC LAMINECTOMY FOR SPINAL CORD STIMULATOR N/A 01/24/2020   Procedure: THORACIC SPINAL CORD STIMULATOR PADDLE TRIAL VIA LAMINECTOMY;  Surgeon: Deetta Perla, MD;  Location: [facility] ORS;  Service: Neurosurgery;  Laterality: N/A;  . TONSILLECTOMY AND ADENOIDECTOMY    . TOTAL KNEE ARTHROPLASTY Left 04/17/2015   Procedure: LEFT TOTAL KNEE ARTHROPLASTY;  Surgeon: Paralee Cancel, MD;  Location: [facility];  Service: Orthopedics;  Laterality: Left;    Family History  Problem Relation Age of Onset  . Heart disease Mother   . Hypertension Mother   . Diabetes Mother   . Heart attack Mother 83  . Heart disease Father   . Heart attack Father 102  . Breast cancer Maternal Aunt   . Heart attack Brother    Social History:  reports that she is a non-smoker but has been exposed to tobacco smoke. She has never used smokeless tobacco. She reports that she does not drink alcohol and does not use drugs.  Allergies: No Known Allergies  Medications Prior to Admission  Medication Sig Dispense Refill  . acetaminophen (TYLENOL) 500 MG tablet Take 1,000 mg by mouth every 8 (eight) hours as needed for moderate pain.    Marland Kitchen alendronate (FOSAMAX) 70 MG tablet Take 70 mg by mouth once a week.     Marland Kitchen aspirin EC 81 MG tablet Take 81 mg by mouth at bedtime. Swallow whole.    Marland Kitchen atorvastatin (LIPITOR) 20 MG tablet Take 1 tablet (20 mg total) by mouth daily. 90 tablet 3  . Biotin 1 MG CAPS Take 1 mg by mouth daily.     Marland Kitchen CALCIUM PO Take 1 tablet by mouth daily.     . cetirizine (ZYRTEC) 10 MG tablet Take 10 mg by mouth every morning.    . Cholecalciferol (VITAMIN D3) 125 MCG (5000 UT) TABS Take 1 tablet (5,000 Units total) by mouth daily. 90 tablet 3  . diltiazem (CARDIZEM SR) 60 MG 12 hr capsule  Take 1 capsule (60 mg total) by mouth 2 (two) times daily. 180 capsule 3  . escitalopram (LEXAPRO) 5 MG tablet Take 1 tablet (5 mg total) by mouth every morning. 90 tablet 3  . folic acid (FOLVITE) 694 MCG tablet Take 1 tablet (400 mcg total) by mouth daily. SEPARATE ALL SUPPLEMENTS TO LUNCH OR DINNER AND PRILOSEC NOT TO MESS W/THYROID MED 90 tablet 3  . gabapentin (NEURONTIN) 300 MG capsule 1 pill in the am and lunch and 2 pills qhs (Patient taking differently: Take 300-600 mg by mouth See admin instructions. Take 300 mg by mouth in the morning and lunch and 600 mg at bedtime)    . hydrochlorothiazide (MICROZIDE) 12.5 MG capsule Take 1 capsule (12.5 mg total) by mouth daily. In am 90 capsule 3  . levothyroxine (SYNTHROID) 88 MCG tablet Take 1 tablet (88 mcg total) by mouth daily before breakfast. Skip sundays 90 tablet 3  . lidocaine (LIDODERM) 5 % Place 1-2 patches onto the skin daily as needed (pain). Remove & Discard patch within 12 hours or as directed by MD    . LORazepam (ATIVAN) 0.5 MG tablet Take 1 tablet (0.5 mg total) by mouth 2 (two) times daily as needed for anxiety or sleep. Or 1 mg at night for sleep 60 tablet 5  . mirabegron ER (MYRBETRIQ) 50 MG TB24 tablet Take 1 tablet (50 mg total) by mouth daily. (Patient taking differently: Take 50 mg by mouth every morning. ) 30 tablet 11  . montelukast (SINGULAIR) 10 MG tablet Take 1 tablet (10 mg total) by mouth at bedtime. 90 tablet 3  . omeprazole (PRILOSEC) 40 MG capsule Take 1 capsule (40 mg total) by mouth daily. After lunch 90 capsule 3  . potassium chloride (KLOR-CON) 10 MEQ tablet Take 1 tablet (10 mEq total) by mouth daily. 90 tablet 3  . Probiotic Product (HEALTHY COLON PO) Take 1 capsule by mouth daily.    Marland Kitchen tiZANidine (ZANAFLEX) 4 MG tablet Take 1 tablet (4 mg total) by mouth 2 (two) times daily as needed for muscle spasms. 60 tablet 5  . traMADol (ULTRAM) 50 MG tablet Take 50 mg by mouth every 6 (six) hours as needed.    .  traZODone (DESYREL) 50 MG tablet Take 2 tablets (100 mg total) by mouth at bedtime. 1 hr before bed 90 tablet 1  . Turmeric Curcumin 500 MG CAPS Take 500 mg by mouth daily.    . Vaginal Lubricant (REPLENS) GEL Place 1 application vaginally every 3 (  three) days.     Marland Kitchen acetaminophen (TYLENOL) 325 MG tablet Take 2 tablets (650 mg total) by mouth every 4 (four) hours as needed for mild pain ((score 1 to 3) or temp > 100.5). (Patient not taking: Reported on 06/16/2020)    . Cranberry 500 MG CAPS Take 500 mg by mouth daily. (Patient not taking: Reported on 06/16/2020)    . fluticasone (FLONASE) 50 MCG/ACT nasal spray Place 2 sprays into both nostrils daily as needed for allergies or rhinitis. Use after nasal saline (Patient not taking: Reported on 06/16/2020) 16 g 12  . hydrALAZINE (APRESOLINE) 10 MG tablet Take 2 tablets (20 mg total) by mouth 2 (two) times daily as needed. BP>140/>90 (Patient not taking: Reported on 06/16/2020) 180 tablet 3  . naloxegol oxalate (MOVANTIK) 12.5 MG TABS tablet Take 1-2 tablets (12.5-25 mg total) by mouth daily. Constipation x 3 days prn (Patient not taking: Reported on 06/16/2020) 6 tablet 0  . polyethylene glycol powder (GLYCOLAX/MIRALAX) 17 GM/SCOOP powder Take 17 g by mouth daily as needed for moderate constipation. (Patient not taking: Reported on 06/16/2020) 255 g 11  . sodium chloride (OCEAN) 0.65 % SOLN nasal spray Place 2 sprays into both nostrils as needed for congestion. Use 1st (Patient not taking: Reported on 06/16/2020) 1 Bottle 12  . vitamin B-12 (CYANOCOBALAMIN) 1000 MCG tablet Take 1 tablet (1,000 mcg total) by mouth daily. (Patient not taking: Reported on 06/16/2020) 90 tablet 3    No results found for this or any previous visit (from the past 48 hour(s)). No results found.  Review of Systems General ROS: Negative Respiratory ROS: Negative Cardiovascular ROS: Negative Gastrointestinal ROS: Negative Genito-Urinary ROS: Negative Musculoskeletal ROS:  Positive for back pain Neurological ROS: Positive for bilateral leg pain Dermatological ROS: Negative  Blood pressure (!) 135/92, pulse 71, temperature (!) 97.1 F (36.2 C), temperature source Tympanic, resp. rate 20, height _0  (1.6 m), weight 72.6 kg, SpO2 95 %. Physical Exam  General appearance: Alert, cooperative, in no acute distress Skin: Incision sites healing well. No bleeding, erythema, edema, drainage, warmth, tenderness, fluctuance.   Neurologic exam:  Mental status: alertness: alert, affect: normal Speech: fluent and clear Motor:strength symmetric 5/5 in bilateral lower extremities Sensory: intact to light touch in bilateral lower extremities Gait: Antalgic gait, using walker  Assessment/Plan Plan for removal of SCS today  Deetta Perla, MD 06/26/2020, 6:37 AM

## 2020-06-26 NOTE — Anesthesia Procedure Notes (Signed)
Procedure Name: Intubation Performed by: Mohammed Kindle, CRNA Pre-anesthesia Checklist: Patient identified, Emergency Drugs available, Suction available and Patient being monitored Patient Re-evaluated:Patient Re-evaluated prior to induction Oxygen Delivery Method: Circle system utilized Preoxygenation: Pre-oxygenation with 100% oxygen Induction Type: IV induction Ventilation: Mask ventilation without difficulty Laryngoscope Size: McGraph and 3 Tube type: Oral Tube size: 6.5 mm Number of attempts: 1 Airway Equipment and Method: Stylet and Oral airway Placement Confirmation: ETT inserted through vocal cords under direct vision,  positive ETCO2,  breath sounds checked- equal and bilateral and CO2 detector Tube secured with: Tape Dental Injury: Teeth and Oropharynx as per pre-operative assessment

## 2020-06-26 NOTE — Telephone Encounter (Signed)
See Neurosurgery response pain control pt has pain clinic appt 08/2020 will have to discuss chronic pain with them if still has this  I do not fill chronic pain meds

## 2020-06-26 NOTE — Progress Notes (Signed)
Pharmacy Antibiotic Note  CORBIN HOTT is a 74 y.o. female admitted on 06/26/2020 with surgical prophylaxis.  Pharmacy has been consulted for cefazolin dosing.  Plan: Cefazolin 2 gm IV X 1 60 min pre-op.   Height: 5\' 3"  (160 cm) Weight: 72.6 kg (160 lb) IBW/kg (Calculated) : 52.4  Temp (24hrs), Avg:97.1 F (36.2 C), Min:97.1 F (36.2 C), Max:97.1 F (36.2 C)  Recent Labs  Lab 06/20/20 0939  CREATININE 0.87    Estimated Creatinine Clearance: 53.4 mL/min (by C-G formula based on SCr of 0.87 mg/dL).    No Known Allergies  Antimicrobials this admission:   >>    >>   Dose adjustments this admission:   Microbiology results:  BCx:   UCx:    Sputum:    MRSA PCR:   Thank you for allowing pharmacy to be a part of this patient's care.  Julyana Woolverton D 06/26/2020 6:40 AM

## 2020-06-26 NOTE — Interval H&P Note (Signed)
History and Physical Interval Note:  06/26/2020 6:39 AM  Kimberly Cook  has presented today for surgery, with the diagnosis of failed spinal cord stimulator.  The various methods of treatment have been discussed with the patient and family. After consideration of risks, benefits and other options for treatment, the patient has consented to  Procedure(s): SPINAL CORD STIMULATOR REMOVAL (N/A) as a surgical intervention.  The patient's history has been reviewed, patient examined, no change in status, stable for surgery.  I have reviewed the patient's chart and labs.  Questions were answered to the patient's satisfaction.     Lucy Chris

## 2020-06-26 NOTE — Telephone Encounter (Signed)
Left message to return call 

## 2020-06-26 NOTE — Anesthesia Postprocedure Evaluation (Signed)
Anesthesia Post Note  Patient: Kimberly Cook  Procedure(s) Performed: SPINAL CORD STIMULATOR REMOVAL (N/A )  Patient location during evaluation: PACU Anesthesia Type: General Level of consciousness: awake and alert Pain management: pain level controlled Vital Signs Assessment: post-procedure vital signs reviewed and stable Respiratory status: spontaneous breathing, nonlabored ventilation, respiratory function stable and patient connected to nasal cannula oxygen Cardiovascular status: blood pressure returned to baseline and stable Postop Assessment: no apparent nausea or vomiting Anesthetic complications: no   No complications documented.   Last Vitals:  Vitals:   06/26/20 0926 06/26/20 0931  BP:  128/90  Pulse:  71  Resp:  (!) 23  Temp: (!) 36.1 C (!) 36.1 C  SpO2:  96%    Last Pain:  Vitals:   06/26/20 0931  TempSrc:   PainSc: 1                  Yevette Edwards

## 2020-06-26 NOTE — Telephone Encounter (Signed)
We can typically give 2-3 weeks post op, not sure she really needs this long term and I discussed that with her, it was mainly for surgical pain. I dont think its a great option for her chronic pain

## 2020-06-26 NOTE — Progress Notes (Signed)
At 10:00 when patient was entering the post op area she felt the need to void but she only could void 25cc. I did a nursing protocol and performed a bladder scan and the patient had 778cc of urine in her bladder. I called Christine in the OR to ask if I should perform and in and out catheter for the patient and she agreed. The patient then asked if she could go to the bathroom to try again. She urinated 150 so I rescanned her and I got 449 so I am holding the patient until she can urinate a little more. I will monitor the patient while we wait.

## 2020-06-26 NOTE — Telephone Encounter (Signed)
Patient informed and verbalized understanding.  States that she recently had back surgery and they gave her a temporary supply of pain medication that is helping with everything over all. She will wait for her appointment with pain management.

## 2020-06-28 ENCOUNTER — Telehealth: Payer: Self-pay | Admitting: Internal Medicine

## 2020-06-28 NOTE — Telephone Encounter (Signed)
Faxed to [facility] pharmacy mail delivery for Lorazepam 0.5 mg and Tramadol 50 mg faxed on 06-28-2020

## 2020-07-05 ENCOUNTER — Ambulatory Visit: Payer: Medicare HMO | Admitting: Internal Medicine

## 2020-07-10 ENCOUNTER — Ambulatory Visit: Payer: Medicare HMO | Admitting: Internal Medicine

## 2020-07-12 ENCOUNTER — Ambulatory Visit: Payer: Medicare HMO | Admitting: Internal Medicine

## 2020-07-13 ENCOUNTER — Telehealth: Payer: Self-pay | Admitting: Internal Medicine

## 2020-07-13 DIAGNOSIS — R350 Frequency of micturition: Secondary | ICD-10-CM

## 2020-07-13 DIAGNOSIS — R309 Painful micturition, unspecified: Secondary | ICD-10-CM

## 2020-07-13 DIAGNOSIS — R300 Dysuria: Secondary | ICD-10-CM

## 2020-07-13 NOTE — Telephone Encounter (Signed)
Pt thinks she has a UTI and said that she normal just calls and Dr.Tracy puts in a order for a Urine and calls something in  Her urine is cloudy, and also has an odor

## 2020-07-13 NOTE — Telephone Encounter (Signed)
Patient states symptoms started Tuesday and worsened yesterday. Having burning, frequency, pain with urination, decreased output.   Patient is agreeable to a consolation fee. Labs scheduled for tomorrow morning and ordered. Patient understanding medication can not be sent in until urine is collected.   Patient also states her BP is 99-64 and she is feeling weak.

## 2020-07-14 ENCOUNTER — Other Ambulatory Visit (INDEPENDENT_AMBULATORY_CARE_PROVIDER_SITE_OTHER): Payer: Medicare HMO

## 2020-07-14 ENCOUNTER — Other Ambulatory Visit: Payer: Self-pay

## 2020-07-14 DIAGNOSIS — R300 Dysuria: Secondary | ICD-10-CM | POA: Diagnosis not present

## 2020-07-14 DIAGNOSIS — R350 Frequency of micturition: Secondary | ICD-10-CM | POA: Diagnosis not present

## 2020-07-14 DIAGNOSIS — R309 Painful micturition, unspecified: Secondary | ICD-10-CM | POA: Diagnosis not present

## 2020-07-16 LAB — URINALYSIS
Bilirubin Urine: NEGATIVE
Glucose, UA: NEGATIVE
Ketones, ur: NEGATIVE
Nitrite: POSITIVE — AB
Specific Gravity, Urine: 1.012 (ref 1.001–1.03)
pH: <=5 (ref 5.0–8.0)

## 2020-07-16 LAB — URINE CULTURE
MICRO NUMBER:: 11303218
SPECIMEN QUALITY:: ADEQUATE

## 2020-07-17 ENCOUNTER — Other Ambulatory Visit: Payer: Self-pay | Admitting: Internal Medicine

## 2020-07-17 ENCOUNTER — Telehealth: Payer: Self-pay | Admitting: Internal Medicine

## 2020-07-17 DIAGNOSIS — N390 Urinary tract infection, site not specified: Secondary | ICD-10-CM

## 2020-07-17 MED ORDER — NITROFURANTOIN MONOHYD MACRO 100 MG PO CAPS: 100.0000 mg | ORAL_CAPSULE | Freq: Two times a day (BID) | ORAL | 0 refills | Status: DC

## 2020-07-17 NOTE — Telephone Encounter (Addendum)
Patient was returning call for results 

## 2020-07-17 NOTE — Telephone Encounter (Signed)
Advise pt to increase water intake    Telephone consult UTI  1. Sxs started tues of last week worsened Friday with dysuria, freq, and reduced urine and low BP 99/64 and feeling week   Agreeable to consult fee  A/P  UTI E coli  Rx macrobid bid x 5 days  F/u urology [place] for recurrent uti   Dr. Desma Maxim Scocuzza  Time 5-10 min

## 2020-07-17 NOTE — Telephone Encounter (Signed)
LMTCB for results. 

## 2020-07-17 NOTE — Telephone Encounter (Signed)
LMTCB

## 2020-07-19 NOTE — Telephone Encounter (Signed)
Patient informed and verbalized understanding

## 2020-07-24 ENCOUNTER — Ambulatory Visit (INDEPENDENT_AMBULATORY_CARE_PROVIDER_SITE_OTHER): Payer: Medicare HMO

## 2020-07-24 VITALS — Ht 63.0 in | Wt 160.0 lb

## 2020-07-24 DIAGNOSIS — Z0000 Encounter for general adult medical examination without abnormal findings: Secondary | ICD-10-CM

## 2020-07-24 NOTE — Progress Notes (Addendum)
Subjective:   Kimberly Cook is a 74 y.o. female who presents for Medicare Annual (Subsequent) preventive examination.  Review of Systems    No ROS.  Medicare Wellness Virtual Visit.   Cardiac Risk Factors include: advanced age (>82mn, >>56women);hypertension     Objective:    Today's Vitals   07/24/20 0904  Weight: 160 lb (72.6 kg)  Height: _0  (1.6 m)   Body mass index is 28.34 kg/m.  Advanced Directives 07/24/2020 06/19/2020 04/24/2020 04/19/2020 01/31/2020 01/24/2020 01/20/2020  Does Patient Have a Medical Advance Directive? Yes No Yes Yes No No Yes  Type of AParamed[place]of [place] will - [place] will Living will;Healthcare Power of [place] will  Does patient want to make changes to medical advance directive? No - Patient declined - No - Patient declined - - - -  Copy of [place] Chart? Yes - validated most recent copy scanned in chart (See row information) - No - copy requested No - copy requested - - -  Would patient like information on creating a medical advance directive? - - - - No - Patient declined No - Patient declined -    Current Medications (verified) Outpatient Encounter Medications as of 07/24/2020  Medication Sig  . acetaminophen (TYLENOL) 325 MG tablet Take 2 tablets (650 mg total) by mouth every 4 (four) hours as needed for mild pain ((score 1 to 3) or temp > 100.5). (Patient not taking: Reported on 06/16/2020)  . acetaminophen (TYLENOL) 500 MG tablet Take 1,000 mg by mouth every 8 (eight) hours as needed for moderate pain.  .Marland Kitchenalendronate (FOSAMAX) 70 MG tablet Take 70 mg by mouth once a week.   .Marland Kitchenaspirin EC 81 MG tablet Take 81 mg by mouth at bedtime. Swallow whole.  .Marland Kitchenatorvastatin (LIPITOR) 20 MG tablet Take 1 tablet (20 mg total) by mouth daily.  . Biotin 1 MG CAPS Take 1 mg by mouth daily.   .Marland KitchenCALCIUM PO Take 1 tablet by mouth  daily.   . cetirizine (ZYRTEC) 10 MG tablet Take 10 mg by mouth every morning.  . Cholecalciferol (VITAMIN D3) 125 MCG (5000 UT) TABS Take 1 tablet (5,000 Units total) by mouth daily.  . Cranberry 500 MG CAPS Take 500 mg by mouth daily. (Patient not taking: Reported on 06/16/2020)  . diltiazem (CARDIZEM SR) 60 MG 12 hr capsule Take 1 capsule (60 mg total) by mouth 2 (two) times daily.  .Marland Kitchenescitalopram (LEXAPRO) 5 MG tablet Take 1 tablet (5 mg total) by mouth every morning.  . fluticasone (FLONASE) 50 MCG/ACT nasal spray Place 2 sprays into both nostrils daily as needed for allergies or rhinitis. Use after nasal saline (Patient not taking: Reported on 06/16/2020)  . folic acid (FOLVITE) 4300MCG tablet Take 1 tablet (400 mcg total) by mouth daily. SEPARATE ALL SUPPLEMENTS TO LUNCH OR DINNER AND PRILOSEC NOT TO MESS W/THYROID MED  . gabapentin (NEURONTIN) 300 MG capsule 1 pill in the am and lunch and 2 pills qhs (Patient taking differently: Take 300-600 mg by mouth See admin instructions. Take 300 mg by mouth in the morning and lunch and 600 mg at bedtime)  . hydrALAZINE (APRESOLINE) 10 MG tablet Take 2 tablets (20 mg total) by mouth 2 (two) times daily as needed. BP>140/>90 (Patient not taking: Reported on 06/16/2020)  . hydrochlorothiazide (MICROZIDE) 12.5 MG capsule Take 1 capsule (12.5 mg total) by mouth daily.  In am  . levothyroxine (SYNTHROID) 88 MCG tablet Take 1 tablet (88 mcg total) by mouth daily before breakfast. Skip sundays  . lidocaine (LIDODERM) 5 % Place 1-2 patches onto the skin daily as needed (pain). Remove & Discard patch within 12 hours or as directed by MD  . LORazepam (ATIVAN) 0.5 MG tablet Take 1 tablet (0.5 mg total) by mouth 2 (two) times daily as needed for anxiety or sleep. Or 1 mg at night for sleep  . mirabegron ER (MYRBETRIQ) 50 MG TB24 tablet Take 1 tablet (50 mg total) by mouth daily. (Patient taking differently: Take 50 mg by mouth every morning. )  . montelukast  (SINGULAIR) 10 MG tablet Take 1 tablet (10 mg total) by mouth at bedtime.  . naloxegol oxalate (MOVANTIK) 12.5 MG TABS tablet Take 1-2 tablets (12.5-25 mg total) by mouth daily. Constipation x 3 days prn (Patient not taking: Reported on 06/16/2020)  . nitrofurantoin, macrocrystal-monohydrate, (MACROBID) 100 MG capsule Take 1 capsule (100 mg total) by mouth 2 (two) times daily. With food  . omeprazole (PRILOSEC) 40 MG capsule Take 1 capsule (40 mg total) by mouth daily. After lunch  . polyethylene glycol powder (GLYCOLAX/MIRALAX) 17 GM/SCOOP powder Take 17 g by mouth daily as needed for moderate constipation. (Patient not taking: Reported on 06/16/2020)  . potassium chloride (KLOR-CON) 10 MEQ tablet Take 1 tablet (10 mEq total) by mouth daily.  . Probiotic Product (HEALTHY COLON PO) Take 1 capsule by mouth daily.  . sodium chloride (OCEAN) 0.65 % SOLN nasal spray Place 2 sprays into both nostrils as needed for congestion. Use 1st (Patient not taking: Reported on 06/16/2020)  . tiZANidine (ZANAFLEX) 4 MG tablet Take 1 tablet (4 mg total) by mouth 2 (two) times daily as needed for muscle spasms.  . traZODone (DESYREL) 50 MG tablet Take 2 tablets (100 mg total) by mouth at bedtime. 1 hr before bed  . Turmeric Curcumin 500 MG CAPS Take 500 mg by mouth daily.  . Vaginal Lubricant (REPLENS) GEL Place 1 application vaginally every 3 (three) days.   . vitamin B-12 (CYANOCOBALAMIN) 1000 MCG tablet Take 1 tablet (1,000 mcg total) by mouth daily. (Patient not taking: Reported on 06/16/2020)   No facility-administered encounter medications on file as of 07/24/2020.    Allergies (verified) Patient has no known allergies.   History: Past Medical History:  Diagnosis Date  . Allergy   . Anxiety   . Arthritis   . Back pain   . Chronic kidney disease    STAGE 3  . Coronary artery disease 04/2017   Mild to moderate CAD in LAD/diagonal by CTA (CT-FFR of apical LAD 0.79).  . DDD (degenerative disc disease),  lumbar   . Depression   . Diverticulosis   . Essential hypertension    Normal cardiolite 05/2006 EF 71%  . GERD (gastroesophageal reflux disease)   . Headache   . History of shingles   . Hyperlipidemia   . Hypothyroidism   . Lung nodule   . Mini stroke ([place]) 2011   . Occipital neuralgia   . Palpitations   . Pneumonia 2018  . Prediabetes   . Stroke ([place])   . Stroke [facility])    MRI 04/2008 + left sup. frontal gyrus possibly puntate infarct   . Syncope 2019  . Urinary tract infection   . Vitamin D deficiency    Past Surgical History:  Procedure Laterality Date  . ABDOMINAL HYSTERECTOMY    . BLADDER SURGERY  2003  . BREAST EXCISIONAL BIOPSY Right Over 20 years    Benign  . CHOLECYSTECTOMY    . gastroplication     . JOINT REPLACEMENT Left    KNEE  . KNEE ARTHROSCOPY Left 2011  . PULSE GENERATOR IMPLANT Left 01/31/2020   Procedure: PLACEMENT RIGHT FLANK PULSE GENERATOR VS REMOVAL SPINAL CORD STIMULATOR;  Surgeon: Deetta Perla, MD;  Location: [facility] ORS;  Service: Neurosurgery;  Laterality: Left;  . PULSE GENERATOR IMPLANT Left 04/24/2020   Procedure: REPLACEMENT LEFT FLANK PULSE GENERATOR IMPLANT;  Surgeon: Deetta Perla, MD;  Location: [facility] ORS;  Service: Neurosurgery;  Laterality: Left;  MAC w/ local  . right arm fracture    . SPINAL CORD STIMULATOR REMOVAL N/A 06/26/2020   Procedure: SPINAL CORD STIMULATOR REMOVAL;  Surgeon: Deetta Perla, MD;  Location: [facility] ORS;  Service: Neurosurgery;  Laterality: N/A;  . THORACIC LAMINECTOMY FOR SPINAL CORD STIMULATOR N/A 01/24/2020   Procedure: THORACIC SPINAL CORD STIMULATOR PADDLE TRIAL VIA LAMINECTOMY;  Surgeon: Deetta Perla, MD;  Location: [facility] ORS;  Service: Neurosurgery;  Laterality: N/A;  . TONSILLECTOMY AND ADENOIDECTOMY    . TOTAL KNEE ARTHROPLASTY Left 04/17/2015   Procedure: LEFT TOTAL KNEE ARTHROPLASTY;  Surgeon: Paralee Cancel, MD;  Location: [facility] ORS;  Service: Orthopedics;  Laterality: Left;   Family History  Problem Relation Age  of Onset  . Heart disease Mother   . Hypertension Mother   . Diabetes Mother   . Heart attack Mother 58  . Heart disease Father   . Heart attack Father 104  . Breast cancer Maternal Aunt   . Heart attack Brother    Social History   Socioeconomic History  . Marital status: Widowed    Spouse name: Not on file  . Number of children: Not on file  . Years of education: Not on file  . Highest education level: Not on file  Occupational History  . Not on file  Tobacco Use  . Smoking status: Passive Smoke Exposure - Never Smoker  . Smokeless tobacco: Never Used  . Tobacco comment: husbands and children smoked in home.   Vaping Use  . Vaping Use: Never used  Substance and Sexual Activity  . Alcohol use: No  . Drug use: No  . Sexual activity: Never  Other Topics Concern  . Not on file  Social History Narrative   Widowed    [place]    Has kids and grandson    Social Determinants of Health   Financial Resource Strain: Low Risk   . Difficulty of Paying Living Expenses: Not hard at all  Food Insecurity: No Food Insecurity  . Worried About Charity fundraiser in the Last Year: Never true  . Ran Out of Food in the Last Year: Never true  Transportation Needs: No Transportation Needs  . Lack of Transportation (Medical): No  . Lack of Transportation (Non-Medical): No  Physical Activity: Unknown  . Days of Exercise per Week: 0 days  . Minutes of Exercise per Session: Not on file  Stress: No Stress Concern Present  . Feeling of Stress : Only a little  Social Connections: Unknown  . Frequency of Communication with Friends and Family: More than three times a week  . Frequency of Social Gatherings with Friends and Family: More than three times a week  . Attends Religious Services: Not on file  . Active Member of Clubs or Organizations: Not on file  . Attends Archivist Meetings: Not on file  .  Marital Status: Not on file    Tobacco Counseling Counseling given:  Not Answered Comment: husbands and children smoked in home.    Clinical Intake:  Pre-visit preparation completed: Yes        Diabetes: No  How often do you need to have someone help you when you read instructions, pamphlets, or other written materials from your doctor or pharmacy?: 1 - Never  Interpreter Needed?: No    Activities of Daily Living In your present state of health, do you have any difficulty performing the following activities: 07/24/2020 06/19/2020  Hearing? N N  Vision? N N  Difficulty concentrating or making decisions? N N  Walking or climbing stairs? Y Y  Comment - due to back pain  Dressing or bathing? N N  Doing errands, shopping? Y N  Comment She does not drive or run errands alone Facilities manager and eating ? N -  Using the Toilet? N -  In the past six months, have you accidently leaked urine? Y -  Comment Followed by Urology -  Do you have problems with loss of bowel control? N -  Managing your Medications? N -  Managing your Finances? N -  Housekeeping or managing your Housekeeping? Y -  Comment Maid assist -  Some recent data might be hidden    Patient Care Team: McLean-Scocuzza, Nino Glow, MD as PCP - General (Internal Medicine) End, Harrell Gave, MD as PCP - Cardiology (Cardiology) Wilford Corner, MD as Consulting Physician (Gastroenterology) Elsie Saas, MD as Consulting Physician (Orthopedic Surgery)  Indicate any recent Medical Services you may have received from other than [facility] providers in the past year (date may be approximate).     Assessment:   This is a routine wellness examination for Ranya.  I connected with Nate today by telephone and verified that I am speaking with the correct person using two identifiers. Location patient: home Location provider: work Persons participating in the virtual visit: patient, Marine scientist.    I discussed the limitations, risks, security and privacy concerns of performing an evaluation and  management service by telephone and the availability of in person appointments. The patient expressed understanding and verbally consented to this telephonic visit.    Interactive audio and video telecommunications were attempted between this provider and patient, however failed, due to patient having technical difficulties OR patient did not have access to video capability.  We continued and completed visit with audio only.  Some vital signs may be absent or patient reported.   Hearing/Vision screen  Hearing Screening   _0  _1  _2  _3  _4  _5  _6  _7  _8   Right ear:           Left ear:           Comments: Patient is able to hear conversational tones without difficulty. No issues reported.  Vision Screening Comments: Visual acuity not assessed, virtual visit.   Dietary issues and exercise activities discussed: Current Exercise Habits: Home exercise routine, Type of exercise: walking, Intensity: Mild  Regular diet Good water intake  Goals      Patient Stated   .  Blood Pressure < 140/90 (pt-stated)      Monitor      Depression Screen PHQ 2/9 Scores 07/24/2020 12/07/2019 11/24/2019 07/22/2019 07/17/2018 03/10/2018 12/16/2017  PHQ - 2 Score 0 0 _9 0 0  PHQ- 9 Score - - - - - - -    Fall Risk Fall Risk  07/24/2020 04/25/2020 12/07/2019 11/24/2019 07/22/2019  Falls in the past year? 0 0 _0 Comment - - - - -  Number falls in past yr: 0 0 1 0 0  Comment - - - - -  Injury with Fall? 0 0 0 0 -  Risk Factor Category  - - - - -  Risk for fall due to : Impaired balance/gait - - History of fall(s);Impaired balance/gait;Impaired mobility Impaired balance/gait  Risk for fall due to: Comment - - - - -  Follow up Falls evaluation completed Falls evaluation completed - Falls evaluation completed Falls prevention discussed    FALL RISK PREVENTION PERTAINING TO THE HOME: Handrails in use when climbing stairs? Yes Home free of loose throw rugs in walkways, pet beds,  electrical cords, etc? Yes  Adequate lighting in your home to reduce risk of falls? Yes   ASSISTIVE DEVICES UTILIZED TO PREVENT FALLS: Life alert? Yes  Use of a cane, walker or w/c? Yes , rollator walker Grab bars in the bathroom? Yes  Shower chair or bench in shower? Yes  Elevated toilet seat or a handicapped toilet? Yes   TIMED UP AND GO: Was the test performed? No . Virtual visit.   Cognitive Function:     6CIT Screen 07/24/2020 07/22/2019 07/17/2018  What Year? 0 points 0 points 0 points  What month? 0 points 0 points 0 points  What time? 0 points 0 points 0 points  Count back from 20 0 points 0 points 0 points  Months in reverse 0 points 0 points 0 points  Repeat phrase 0 points 0 points 0 points  Total Score 0 0 0    Immunizations Immunization History  Administered Date(s) Administered  . DT (Pediatric) 09/26/2015  . Fluad Quad(high Dose 65+) 04/29/2019  . Influenza, High Dose Seasonal PF 06/16/2015, 04/02/2016, 05/06/2017, 05/04/2018, 04/17/2020  . Influenza-Unspecified 04/18/2013, 04/05/2014  . PFIZER SARS-COV-2 Vaccination 08/26/2019, 09/28/2019, 05/19/2020  . PPD Test 04/19/2015  . Pneumococcal Conjugate-13 06/16/2015, 04/17/2020  . Pneumococcal Polysaccharide-23 01/19/2018, 05/22/2018  . Pneumococcal-Unspecified 08/18/2010  . Td 08/18/2005  . Zoster 08/18/2005  . Zoster Recombinat (Shingrix) 05/22/2018   Health Maintenance Health Maintenance  Topic Date Due  . COLONOSCOPY  06/02/2021  . TETANUS/TDAP  09/25/2025  . INFLUENZA VACCINE  Completed  . DEXA SCAN  Completed  . COVID-19 Vaccine  Completed  . Hepatitis C Screening  Completed  . PNA vac Low Risk Adult  Completed  . FOOT EXAM  Discontinued  . HEMOGLOBIN A1C  Discontinued  . OPHTHALMOLOGY EXAM  Discontinued   Colorectal cancer screening: Type of screening: Colonoscopy. Completed 06/03/11. Repeat every 10 years.  Mammogram- Plans to schedule at the top of the year. Delayed in the last year due  to covid. Ordered 11/24/19. 3D Bilateral. Last completed 04/20/19.   Dexa Scan- 04/14/19. Osteoporosis. Repeat every 2 years. Cholecalciferol (VITAMIN D3) 125 MCG (5000 UT) TABS. CALCIUM PO. alendronate (FOSAMAX) 70 MG tablet  Lung Cancer Screening: (Low Dose CT Chest recommended if Age 40-80 years, 30 pack-year currently smoking OR have quit w/in 15years.) does not qualify.   Hepatitis C Screening: Completed 12/16/17.  Vision Screening: Recommended annual ophthalmology exams for early detection of glaucoma and other disorders of the eye. Is the patient up to date with their annual eye exam?  Yes  Who is the provider or what is the name of the office in which the patient attends annual eye exams? [facility].  Dental Screening: Recommended annual dental exams for proper oral hygiene.  Community Resource Referral / Chronic Care Management: CRR required this visit?  No   CCM required this visit?  No      Plan:   Keep all routine maintenance appointments.   Follow up 08/31/19 @ 9:00  I have personally reviewed and noted the following in the patient's chart:   . Medical and social history . Use of alcohol, tobacco or illicit drugs  . Current medications and supplements . Functional ability and status . Nutritional status . Physical activity . Advanced directives . List of other physicians . Hospitalizations, surgeries, and ER visits in previous 12 months . Vitals . Screenings to include cognitive, depression, and falls . Referrals and appointments  In addition, I have reviewed and discussed with patient certain preventive protocols, quality metrics, and best practice recommendations. A written personalized care plan for preventive services as well as general preventive health recommendations were provided to patient via mail.     Varney Biles, LPN   75/17/0017     Reviewed above information.  Agree with assessment and plan.    Dr Nicki Reaper

## 2020-07-24 NOTE — Patient Instructions (Addendum)
Kimberly Cook , Thank you for taking time to come for your Medicare Wellness Visit. I appreciate your ongoing commitment to your health goals. Please review the following plan we discussed and let me know if I can assist you in the future.   These are the goals we discussed: Goals      Patient Stated   .  Blood Pressure < 140/90 (pt-stated)      Monitor       This is a list of the screening recommended for you and due dates:  Health Maintenance  Topic Date Due  . Colon Cancer Screening  06/02/2021  . Tetanus Vaccine  09/25/2025  . Flu Shot  Completed  . DEXA scan (bone density measurement)  Completed  . COVID-19 Vaccine  Completed  .  Hepatitis C: One time screening is recommended by Center for Disease Control  (CDC) for  adults born from [birth year] through 1966.   Completed  . Pneumonia vaccines  Completed  . Complete foot exam   Discontinued  . Hemoglobin A1C  Discontinued  . Eye exam for diabetics  Discontinued    Immunizations Immunization History  Administered Date(s) Administered  . DT (Pediatric) 09/26/2015  . Fluad Quad(high Dose 65+) 04/29/2019  . Influenza, High Dose Seasonal PF 06/16/2015, 04/02/2016, 05/06/2017, 05/04/2018, 04/17/2020  . Influenza-Unspecified 04/18/2013, 04/05/2014  . PFIZER SARS-COV-2 Vaccination 08/26/2019, 09/28/2019  . PPD Test 04/19/2015  . Pneumococcal Conjugate-13 06/16/2015, 04/17/2020  . Pneumococcal Polysaccharide-23 01/19/2018, 05/22/2018  . Pneumococcal-Unspecified 08/18/2010  . Td 08/18/2005  . Zoster 08/18/2005  . Zoster Recombinat (Shingrix) 05/22/2018   Keep all routine maintenance appointments.   Follow up 08/31/19 @ 9:00  Advanced directives: on file  Conditions/risks identified: none new  Follow up in one year for your annual wellness visit.   Preventive Care 54 Years and Older, Female Preventive care refers to lifestyle choices and visits with your health care provider that can promote health and wellness. What does  preventive care include?  A yearly physical exam. This is also called an annual well check.  Dental exams once or twice a year.  Routine eye exams. Ask your health care provider how often you should have your eyes checked.  Personal lifestyle choices, including:  Daily care of your teeth and gums.  Regular physical activity.  Eating a healthy diet.  Avoiding tobacco and drug use.  Limiting alcohol use.  Practicing safe sex.  Taking low-dose aspirin every day.  Taking vitamin and mineral supplements as recommended by your health care provider. What happens during an annual well check? The services and screenings done by your health care provider during your annual well check will depend on your age, overall health, lifestyle risk factors, and family history of disease. Counseling  Your health care provider may ask you questions about your:  Alcohol use.  Tobacco use.  Drug use.  Emotional well-being.  Home and relationship well-being.  Sexual activity.  Eating habits.  History of falls.  Memory and ability to understand (cognition).  Work and work Astronomer.  Reproductive health. Screening  You may have the following tests or measurements:  Height, weight, and BMI.  Blood pressure.  Lipid and cholesterol levels. These may be checked every 5 years, or more frequently if you are over 41 years old.  Skin check.  Lung cancer screening. You may have this screening every year starting at age 66 if you have a 30-pack-year history of smoking and currently smoke or have quit within the past 15  years.  Fecal occult blood test (FOBT) of the stool. You may have this test every year starting at age 54.  Flexible sigmoidoscopy or colonoscopy. You may have a sigmoidoscopy every 5 years or a colonoscopy every 10 years starting at age 59.  Hepatitis C blood test.  Hepatitis B blood test.  Sexually transmitted disease (STD) testing.  Diabetes screening. This  is done by checking your blood sugar (glucose) after you have not eaten for a while (fasting). You may have this done every 1-3 years.  Bone density scan. This is done to screen for osteoporosis. You may have this done starting at age 28.  Mammogram. This may be done every 1-2 years. Talk to your health care provider about how often you should have regular mammograms. Talk with your health care provider about your test results, treatment options, and if necessary, the need for more tests. Vaccines  Your health care provider may recommend certain vaccines, such as:  Influenza vaccine. This is recommended every year.  Tetanus, diphtheria, and acellular pertussis (Tdap, Td) vaccine. You may need a Td booster every 10 years.  Zoster vaccine. You may need this after age 71.  Pneumococcal 13-valent conjugate (PCV13) vaccine. One dose is recommended after age 49.  Pneumococcal polysaccharide (PPSV23) vaccine. One dose is recommended after age 62. Talk to your health care provider about which screenings and vaccines you need and how often you need them. This information is not intended to replace advice given to you by your health care provider. Make sure you discuss any questions you have with your health care provider. Document Released: 08/18/2015 Document Revised: 04/10/2016 Document Reviewed: 05/23/2015 Elsevier Interactive Patient Education  2017 [employer] Prevention in the Home Falls can cause injuries. They can happen to people of all ages. There are many things you can do to make your home safe and to help prevent falls. What can I do on the outside of my home?  Regularly fix the edges of walkways and driveways and fix any cracks.  Remove anything that might make you trip as you walk through a door, such as a raised step or threshold.  Trim any bushes or trees on the path to your home.  Use bright outdoor lighting.  Clear any walking paths of anything that might make  someone trip, such as rocks or tools.  Regularly check to see if handrails are loose or broken. Make sure that both sides of any steps have handrails.  Any raised decks and porches should have guardrails on the edges.  Have any leaves, snow, or ice cleared regularly.  Use sand or salt on walking paths during winter.  Clean up any spills in your garage right away. This includes oil or grease spills. What can I do in the bathroom?  Use night lights.  Install grab bars by the toilet and in the tub and shower. Do not use towel bars as grab bars.  Use non-skid mats or decals in the tub or shower.  If you need to sit down in the shower, use a plastic, non-slip stool.  Keep the floor dry. Clean up any water that spills on the floor as soon as it happens.  Remove soap buildup in the tub or shower regularly.  Attach bath mats securely with double-sided non-slip rug tape.  Do not have throw rugs and other things on the floor that can make you trip. What can I do in the bedroom?  Use night lights.  Make  sure that you have a light by your bed that is easy to reach.  Do not use any sheets or blankets that are too big for your bed. They should not hang down onto the floor.  Have a firm chair that has side arms. You can use this for support while you get dressed.  Do not have throw rugs and other things on the floor that can make you trip. What can I do in the kitchen?  Clean up any spills right away.  Avoid walking on wet floors.  Keep items that you use a lot in easy-to-reach places.  If you need to reach something above you, use a strong step stool that has a grab bar.  Keep electrical cords out of the way.  Do not use floor polish or wax that makes floors slippery. If you must use wax, use non-skid floor wax.  Do not have throw rugs and other things on the floor that can make you trip. What can I do with my stairs?  Do not leave any items on the stairs.  Make sure that  there are handrails on both sides of the stairs and use them. Fix handrails that are broken or loose. Make sure that handrails are as long as the stairways.  Check any carpeting to make sure that it is firmly attached to the stairs. Fix any carpet that is loose or worn.  Avoid having throw rugs at the top or bottom of the stairs. If you do have throw rugs, attach them to the floor with carpet tape.  Make sure that you have a light switch at the top of the stairs and the bottom of the stairs. If you do not have them, ask someone to add them for you. What else can I do to help prevent falls?  Wear shoes that:  Do not have high heels.  Have rubber bottoms.  Are comfortable and fit you well.  Are closed at the toe. Do not wear sandals.  If you use a stepladder:  Make sure that it is fully opened. Do not climb a closed stepladder.  Make sure that both sides of the stepladder are locked into place.  Ask someone to hold it for you, if possible.  Clearly mark and make sure that you can see:  Any grab bars or handrails.  First and last steps.  Where the edge of each step is.  Use tools that help you move around (mobility aids) if they are needed. These include:  Canes.  Walkers.  Scooters.  Crutches.  Turn on the lights when you go into a dark area. Replace any light bulbs as soon as they burn out.  Set up your furniture so you have a clear path. Avoid moving your furniture around.  If any of your floors are uneven, fix them.  If there are any pets around you, be aware of where they are.  Review your medicines with your doctor. Some medicines can make you feel dizzy. This can increase your chance of falling. Ask your doctor what other things that you can do to help prevent falls. This information is not intended to replace advice given to you by your health care provider. Make sure you discuss any questions you have with your health care provider. Document Released:  05/18/2009 Document Revised: 12/28/2015 Document Reviewed: 08/26/2014 Elsevier Interactive Patient Education  2017 [employer].

## 2020-08-03 DIAGNOSIS — R300 Dysuria: Secondary | ICD-10-CM | POA: Diagnosis not present

## 2020-08-03 DIAGNOSIS — I2510 Atherosclerotic heart disease of native coronary artery without angina pectoris: Secondary | ICD-10-CM | POA: Diagnosis not present

## 2020-08-03 DIAGNOSIS — I129 Hypertensive chronic kidney disease with stage 1 through stage 4 chronic kidney disease, or unspecified chronic kidney disease: Secondary | ICD-10-CM | POA: Diagnosis not present

## 2020-08-03 DIAGNOSIS — R2681 Unsteadiness on feet: Secondary | ICD-10-CM | POA: Diagnosis not present

## 2020-08-03 DIAGNOSIS — K219 Gastro-esophageal reflux disease without esophagitis: Secondary | ICD-10-CM | POA: Diagnosis not present

## 2020-08-03 DIAGNOSIS — G8929 Other chronic pain: Secondary | ICD-10-CM | POA: Diagnosis not present

## 2020-08-03 DIAGNOSIS — M549 Dorsalgia, unspecified: Secondary | ICD-10-CM | POA: Diagnosis not present

## 2020-08-03 DIAGNOSIS — E039 Hypothyroidism, unspecified: Secondary | ICD-10-CM | POA: Diagnosis not present

## 2020-08-03 DIAGNOSIS — N183 Chronic kidney disease, stage 3 unspecified: Secondary | ICD-10-CM | POA: Diagnosis not present

## 2020-08-08 ENCOUNTER — Encounter: Payer: Self-pay | Admitting: Student in an Organized Health Care Education/Training Program

## 2020-08-08 ENCOUNTER — Other Ambulatory Visit: Payer: Self-pay

## 2020-08-08 ENCOUNTER — Ambulatory Visit
Payer: Medicare HMO | Attending: Student in an Organized Health Care Education/Training Program | Admitting: Student in an Organized Health Care Education/Training Program

## 2020-08-08 VITALS — BP 123/98 | HR 80 | Temp 97.0°F | Resp 16 | Ht 63.0 in | Wt 155.0 lb

## 2020-08-08 DIAGNOSIS — M48062 Spinal stenosis, lumbar region with neurogenic claudication: Secondary | ICD-10-CM | POA: Diagnosis not present

## 2020-08-08 DIAGNOSIS — M5136 Other intervertebral disc degeneration, lumbar region: Secondary | ICD-10-CM | POA: Diagnosis not present

## 2020-08-08 DIAGNOSIS — G894 Chronic pain syndrome: Secondary | ICD-10-CM | POA: Diagnosis not present

## 2020-08-08 DIAGNOSIS — T85192D Other mechanical complication of implanted electronic neurostimulator (electrode) of spinal cord, subsequent encounter: Secondary | ICD-10-CM | POA: Diagnosis not present

## 2020-08-08 DIAGNOSIS — G8929 Other chronic pain: Secondary | ICD-10-CM

## 2020-08-08 DIAGNOSIS — M5416 Radiculopathy, lumbar region: Secondary | ICD-10-CM

## 2020-08-08 DIAGNOSIS — M47816 Spondylosis without myelopathy or radiculopathy, lumbar region: Secondary | ICD-10-CM

## 2020-08-08 MED ORDER — HYDROCODONE-ACETAMINOPHEN 5-325 MG PO TABS: 1.0000 | ORAL_TABLET | Freq: Three times a day (TID) | ORAL | 0 refills | Status: DC | PRN

## 2020-08-08 NOTE — Progress Notes (Signed)
Safety precautions to be maintained throughout the outpatient stay will include: orient to surroundings, keep bed in low position, maintain call bell within reach at all times, provide assistance with transfer out of bed and ambulation.  

## 2020-08-08 NOTE — Patient Instructions (Signed)
Opioid Pain Medicine Management Opioid pain medicines are strong medicines that are used to treat bad or very bad pain. When you take them for a short time, they can help you:  Sleep better.  Do better in physical therapy.  Feel better during the first few days after you get hurt.  Recover from surgery. Only take these medicines if a doctor says that you can. You should only take them for a short time. This is because opioids can be hard to stop taking (they are addictive). The longer you take opioids, the harder it may be to stop taking them (opioid use disorder). What are the risks? Opioids can cause problems (side effects). Taking them for more than 3 days raises your chance of problems, such as:  Trouble pooping (constipation).  Feeling sick to your stomach (nausea).  Vomiting.  Feeling very sleepy.  Confusion.  Not being able to stop taking the medicine.  Breathing problems. Taking opioids for a long time can make it hard for you to do daily tasks. It can also put you at risk for:  Car accidents.  Depression.  Suicide.  Heart attack.  Taking too much of the medicine (overdose), which can sometimes lead to death. What is a pain treatment plan? A pain treatment plan is a plan made by you and your doctor. Work with your doctor to make a plan for treating your pain. To help you do this:  Talk about the goals of your treatment, including: ? How much pain you might expect to have. ? How you will manage the pain.  Talk about the risks and benefits of taking these medicines for your condition.  Remember that a good treatment plan uses more than one approach and lowers the risks of side effects.  Tell your doctor about the amount of medicines you take and about any drug or alcohol use.  Get your pain medicine prescriptions from only one doctor. Pain can be managed with other treatments. Work with your doctor to find other ways to help your pain, such as:  Physical  therapy.  Counseling.  Eating healthy foods.  Brain exercises.  Massage.  Meditation.  Other pain medicines.  Doing gentle exercises. Tapering your use of opioids If you have been taking opioids for more than a few weeks, you may need to slowly decrease (taper) how much you take until you stop taking them. Doing this can lower your chance of having symptoms, such as:  Pain and cramping in your belly (abdomen).  Feeling sick to your stomach.  Sweating.  Feeling very sleepy.  Feeling restless.  Shaking you cannot control (tremors).  Cravings for the medicine. Do not try to stop taking them by yourself. Work with your doctor to stop. Your doctor will help you take less until you are not taking the medicine at all. Follow these instructions at home: Safety and storage   While you are taking opioids: ? Do not drive. ? Do not use machines or power tools. ? Do not sign important papers (legal documents). ? Do not drink alcohol. ? Do not take sleeping pills. ? Do not take care of children by yourself. ? Do not do activities where you need to climb or be in high places, like working on a ladder. ? Do not go into any water, such as a lake, river, ocean, swimming pool, or hot tub.  Keep your opioids locked up or in a place where children cannot reach them.  Do not share your   pain medicine with anyone. Getting rid of leftover pills Do not save any leftover pills. Get rid of leftover pills safely by:  Taking them to a take-back program in your area.  Bringing them to a pharmacy that has a container for throwing away pills (pill disposal).  Throwing them in the trash. Check the label or package insert of your medicine to see whether this is safe to do. If it is safe to throw them out: 1. Take the pills out of their container. 2. Mix the pills with pet poop or food scraps. 3. Put this in the trash. Activity  Return to your normal activities as told by your doctor. Ask  your doctor what activities are safe for you.  Avoid doing things that make your pain worse.  Do exercises as told by your doctor. General instructions  You may need to take these actions to prevent or treat trouble pooping: ? Drink enough fluid to keep your pee (urine) pale yellow. ? Take over-the-counter or prescription medicines. ? Eat foods that are high in fiber. These include beans, whole grains, and fresh fruits and vegetables. ? Limit foods that are high in fat and sugar. These include fried or sweet foods.  Keep all follow-up visits as told by your doctor. This is important. Where to find support If you have been taking opioids for a long time, think about getting help quitting from a local support group or counselor. Ask your doctor about this. Where to find more information Centers for Disease Control and Prevention (CDC): www.cdc.gov Get help right away if: Seek medical care right away if you are taking opioids and you, or people close to you, notice any of the following:  You have trouble breathing.  Your breathing is slower or more shallow than normal.  You have a very slow heartbeat.  You feel very confused.  You pass out (faint).  You are very sleepy.  Your speech is not normal.  You feel sick to your stomach and vomit.  You have cold skin.  You have blue lips or fingernails.  Your muscles are weak (limp) and your body seems floppy.  The black centers of your eyes (pupils) are smaller than normal. If you think that you or someone else may have taken too much of an opioid medicine, get medical help right away. Call your local emergency services (911 in the [place] Do not drive yourself to the hospital. If you ever feel like you may hurt yourself or others, or have thoughts about taking your own life, get help right away. You can go to your nearest emergency department or call:  Your local emergency services (911 in the [place]  The hotline of the National  Poison Control Center (1-800-222-1222 in the [place]  A suicide crisis helpline, such as the National Suicide Prevention Lifeline at 1-800-273-8255. This is open 24 hours a day. Summary  Opioid are strong medicines that are used to treat bad or very bad pain.  A pain treatment plan is a plan made by you and your doctor. Work with your doctor to make a plan for treating your pain.  Work with your doctor to find other ways to help your pain.  If you think that you or someone else may have taken too much of an opioid, get help right away. This information is not intended to replace advice given to you by your health care provider. Make sure you discuss any questions you have with your health care provider.   Document Revised: 08/21/2018 Document Reviewed: 08/21/2018 Elsevier Patient Education  2020 [employer]  

## 2020-08-08 NOTE — Progress Notes (Signed)
PROVIDER NOTE: Information contained herein reflects review and annotations entered in association with encounter. Interpretation of such information and data should be left to medically-trained personnel. Information provided to patient can be located elsewhere in the medical record under "Patient Instructions". Document created using STT-dictation technology, any transcriptional errors that may result from process are unintentional.    Patient: Kimberly Cook  Service Category: E/M  Provider: Gillis Santa, MD  DOB: 02/02/1946  DOS: 08/08/2020  Specialty: Interventional Pain Management  MRN: [id number]  Setting: Ambulatory outpatient  PCP: McLean-Scocuzza, Nino Glow, MD  Type: Established Patient    Referring Provider: Orland Mustard *  Location: Office  Delivery: Face-to-face     HPI  [name], a 75 y.o. year old female, is here today because of her Failed spinal cord stimulator, subsequent encounter [T85.192D]. Ms. Dwyer primary complain today is Back Pain (lower)  Pertinent problems: Ms. Varnell has Chronic pain; Chronic back pain; Lumbar spondylosis; Lumbar herniated disc; Lumbar degenerative disc disease; Chronic radicular lumbar pain; Lumbar facet arthropathy; and Spinal stenosis, lumbar region, with neurogenic claudication on their pertinent problem list. Pain Assessment: Severity of Chronic pain is reported as a 10-Worst pain ever/10. Location: Back Lower/both legs to the feet. Onset: More than a month ago. Quality: Other (Comment) (excruciating). Timing: Constant. Modifying factor(s): Tylenol, lying on side with knees flexed. Vitals:  height is 5' 3"  (1.6 m) and weight is 155 lb (70.3 kg). Her temporal temperature is 97 F (36.1 C) (abnormal). Her blood pressure is 123/98 (abnormal) and her pulse is 80. Her respiration is 16 and oxygen saturation is 100%.   Reason for encounter: worsening of previously known (established) problem    Patient was initially evaluated  by me on 12/07/2019 for spinal cord stimulator trial evaluation.  See plan below.     Plan from initial visit 12/07/19 I had extensive discussion with the patient regarding spinal cord stimulator trial and what it entails.  Patient has severe facet arthrosis, heterotopic bone formation as well as severe scoliosis.  I evaluated the patient's interlaminar windows under fluoroscopy and unfortunately they are not patent enough to tolerate a percutaneous trial.  This is further complicated by her levoscoliosis which makes it even more challenging to obtain percutaneous access at the T12-L1 or L1-L2 interspace which has severely collapsed disks and significant facet and laminar arthrosis.  For this reason, I do not recommend percutaneous spinal cord stimulator trial.  Could consider paddle trial with Dr. Lacinda Axon or Dr. Cari Caraway.  Patient states that she will reach back out to Dr. Cari Caraway to discuss further.  I also offered the patient a referral to [facility] with Dr. Mearl Latin as he also performs paddle trial but she states that she will follow back up with Dr. Cari Caraway for the time being.  Patient went on to have a spinal cord stimulator implant done with Dr. Lacinda Axon in June 2021.  She had issues with the IPG as well as spinal cord stimulator leads causing her discomfort.  She attempted to work with [employer] spinal cord stimulator representative to try and optimize programming which was not effective.  She was not receiving any analgesic benefit or functional benefit from her spinal cord stimulator trial was having increased pain and discomfort at insertion site so decision was made at that time to remove her spinal cord stimulator which was done on 06/26/2020.  She states that since then, she continues to have persistent mid and low back pain as well as bilateral leg pain  as well as weakness.  She states that she has fallen 3 times over the last week given weakness of her legs.  She also has issues using the bathroom  and states that she is not able to urinate completely but that this has been a chronic issue.  She is here for pain management.  She states that she is been on able to obtain tramadol for the last 2 months that she has been in severe pain.  She states that tramadol is not very effective.  She utilizes gabapentin 300 mg in the morning 300 mg at lunch and 600 mg nightly.  She is having a hard time coping with her pain and is very tearful during the encounter.  She has significant limitations in performing ADLs given her chronic pain burden.  ROS  Constitutional: Denies any fever or chills Gastrointestinal: No reported hemesis, hematochezia, vomiting, or acute GI distress Musculoskeletal: , Mid back, low back pain, bilateral leg pain Neurological: No reported episodes of acute onset apraxia, aphasia, dysarthria, agnosia, amnesia, paralysis, loss of coordination, or loss of consciousness  Medication Review  Biotin, Calcium, Cranberry, HYDROcodone-acetaminophen, LORazepam, Probiotic Product, Replens, Turmeric Curcumin, Vitamin D3, acetaminophen, alendronate, aspirin EC, atorvastatin, cetirizine, diltiazem, escitalopram, fluticasone, folic acid, gabapentin, hydrALAZINE, hydrochlorothiazide, levothyroxine, lidocaine, mirabegron ER, montelukast, naloxegol oxalate, nitrofurantoin (macrocrystal-monohydrate), omeprazole, polyethylene glycol powder, potassium chloride, sodium chloride, tiZANidine, traZODone, and vitamin B-12  History Review  Allergy: Ms. Bonfiglio has No Known Allergies. Drug: Ms. Reeser  reports no history of drug use. Alcohol:  reports no history of alcohol use. Tobacco:  reports that she is a non-smoker but has been exposed to tobacco smoke. She has never used smokeless tobacco. Social: Ms. Rettke  reports that she is a non-smoker but has been exposed to tobacco smoke. She has never used smokeless tobacco. She reports that she does not drink alcohol and does not use drugs. Medical:  has  a past medical history of Allergy, Anxiety, Arthritis, Back pain, Chronic kidney disease, Coronary artery disease (04/2017), DDD (degenerative disc disease), lumbar, Depression, Diverticulosis, Essential hypertension, GERD (gastroesophageal reflux disease), Headache, History of shingles, Hyperlipidemia, Hypothyroidism, Lung nodule, Mini stroke ([place]) (2011 ), Occipital neuralgia, Palpitations, Pneumonia (2018), Prediabetes, Stroke [facility]), Stroke [facility]), Syncope (2019), Urinary tract infection, and Vitamin D deficiency. Surgical: Ms. Lodico  has a past surgical history that includes Cholecystectomy; Bladder surgery; Tonsillectomy and adenoidectomy; Knee arthroscopy (Left, 2011); Abdominal hysterectomy; gastroplication ; Total knee arthroplasty (Left, 04/17/2015); Breast excisional biopsy (Right, Over 20 years ); Joint replacement (Left); Thoracic laminectomy for spinal cord stimulator (N/A, 01/24/2020); Pulse generator implant (Left, 01/31/2020); right arm fracture; Pulse generator implant (Left, 04/24/2020); and Spinal cord stimulator removal (N/A, 06/26/2020). Family: family history includes Breast cancer in her maternal aunt; Diabetes in her mother; Heart attack in her brother; Heart attack (age of onset: 64) in her mother; Heart attack (age of onset: 32) in her father; Heart disease in her father and mother; Hypertension in her mother.  Laboratory Chemistry Profile   Renal Lab Results  Component Value Date   BUN 13 06/20/2020   CREATININE 0.87 06/20/2020   LABCREA 119 12/16/2017   BCR 17 04/11/2017   GFR 56.82 (L) 10/09/2018   GFRAA >60 04/19/2020   GFRNONAA >60 06/20/2020   EPIRU see note 12/16/2017   XYVOPFY92KMQ 3 09/29/2017   NOREPRU 13 09/29/2017   NOREPI24HUR 20 09/29/2017   DOPARU 313 12/16/2017   DOPAM24HRUR 215 09/29/2017     Hepatic Lab Results  Component Value Date  AST 17 10/19/2019   ALT 16 10/19/2019   ALBUMIN 4.0 10/19/2019   ALKPHOS 48 10/19/2019   LIPASE 27 10/19/2019      Electrolytes Lab Results  Component Value Date   NA 138 06/20/2020   K 4.0 06/20/2020   CL 103 06/20/2020   CALCIUM 9.0 06/20/2020   MG 1.9 05/19/2018   PHOS 2.7 04/25/2008     Bone Lab Results  Component Value Date   VD25OH 70.87 05/19/2018     Inflammation (CRP: Acute Phase) (ESR: Chronic Phase) Lab Results  Component Value Date   LATICACIDVEN 1.6 12/26/2009       Note: Above Lab results reviewed.  Recent Imaging Review  DG C-Arm 1-60 Min CLINICAL DATA:  Spinal cord stimulator placement.  EXAM: DG C-ARM 1-60 MIN; THORACIC SPINE 2 VIEWS  COMPARISON:  CT thoracic spine dated November 03, 2019.  FLUOROSCOPY TIME:  48 seconds.  C-arm fluoroscopic images were obtained intraoperatively and submitted for post operative interpretation.  FINDINGS: Multiple intraoperative fluoroscopic images of the thoracic spine demonstrate placement of a spinal cord stimulator terminating at T7-T8. Postsurgical changes in the upper abdomen.  IMPRESSION: Intraoperative fluoroscopic guidance for thoracic spinal cord stimulator placement.  Electronically Signed   By: Titus Dubin M.D.   On: 01/24/2020 15:10 DG C-Arm 1-60 Min CLINICAL DATA:  Spinal cord stimulator placement.  EXAM: DG C-ARM 1-60 MIN; THORACIC SPINE 2 VIEWS  COMPARISON:  CT thoracic spine dated November 03, 2019.  FLUOROSCOPY TIME:  48 seconds.  C-arm fluoroscopic images were obtained intraoperatively and submitted for post operative interpretation.  FINDINGS: Multiple intraoperative fluoroscopic images of the thoracic spine demonstrate placement of a spinal cord stimulator terminating at T7-T8. Postsurgical changes in the upper abdomen.  IMPRESSION: Intraoperative fluoroscopic guidance for thoracic spinal cord stimulator placement.  Electronically Signed   By: Titus Dubin M.D.   On: 01/24/2020 15:10 DG Thoracic Spine 2 View CLINICAL DATA:  Spinal cord stimulator placement.  EXAM: DG C-ARM  1-60 MIN; THORACIC SPINE 2 VIEWS  COMPARISON:  CT thoracic spine dated November 03, 2019.  FLUOROSCOPY TIME:  48 seconds.  C-arm fluoroscopic images were obtained intraoperatively and submitted for post operative interpretation.  FINDINGS: Multiple intraoperative fluoroscopic images of the thoracic spine demonstrate placement of a spinal cord stimulator terminating at T7-T8. Postsurgical changes in the upper abdomen.  IMPRESSION: Intraoperative fluoroscopic guidance for thoracic spinal cord stimulator placement.  Electronically Signed   By: Titus Dubin M.D.   On: 01/24/2020 15:10 DG C-Arm 1-60 Min CLINICAL DATA:  Spinal cord stimulator placement.  EXAM: DG C-ARM 1-60 MIN; THORACIC SPINE 2 VIEWS  COMPARISON:  CT thoracic spine dated November 03, 2019.  FLUOROSCOPY TIME:  48 seconds.  C-arm fluoroscopic images were obtained intraoperatively and submitted for post operative interpretation.  FINDINGS: Multiple intraoperative fluoroscopic images of the thoracic spine demonstrate placement of a spinal cord stimulator terminating at T7-T8. Postsurgical changes in the upper abdomen.  IMPRESSION: Intraoperative fluoroscopic guidance for thoracic spinal cord stimulator placement.  Electronically Signed   By: Titus Dubin M.D.   On: 01/24/2020 15:10 Note: Reviewed        Physical Exam  General appearance: alert, cooperative and in mild distress Mental status: Alert, oriented x 3 (person, place, & time)       Respiratory: No evidence of acute respiratory distress Eyes: PERLA Vitals: BP (!) 123/98   Pulse 80   Temp (!) 97 F (36.1 C) (Temporal)   Resp 16   Ht 5' 3"  (  1.6 m)   Wt 155 lb (70.3 kg)   SpO2 100%   BMI 27.46 kg/m  BMI: Estimated body mass index is 27.46 kg/m as calculated from the following:   Height as of this encounter: 5' 3"  (1.6 m).   Weight as of this encounter: 155 lb (70.3 kg). Ideal: Ideal body weight: 52.4 kg (115 lb 8.3 oz) Adjusted ideal  body weight: 59.6 kg (131 lb 5 oz)  Thoracic spine: Surgical scar present.  Scoliosis noted.  Mild tenderness noted along the scar. Lumbar spine: Limited range of motion, pain with lumbar extension.  Scoliosis noted. Lower extremity: 4 out of 5 strength bilateral lower extremity: Plantar flexion, dorsiflexion, knee flexion, knee extension, antalgic gait, ambulates with walker, instability while walking Positive straight leg raise test bilaterally Neuropathic pain pattern noted of lower extremity  Assessment   Diagnosis  1. Failed spinal cord stimulator, subsequent encounter   2. Chronic radicular lumbar pain   3. Lumbar radiculopathy   4. Lumbar facet arthropathy   5. Lumbar spondylosis   6. Spinal stenosis, lumbar region, with neurogenic claudication   7. Chronic pain syndrome   8. Lumbar degenerative disc disease      Updated Problems: Problem  Chronic Radicular Lumbar Pain  Lumbar Facet Arthropathy  Spinal Stenosis, Lumbar Region, With Neurogenic Claudication  Lumbar Herniated Disc  Lumbar Degenerative Disc Disease  Lumbar Spondylosis  Chronic Back Pain  Chronic Pain   Neck, back 2/2 scoliosis, DDD, spinal stenosis    Failed Spinal Cord Stimulator, Subsequent Encounter    Plan of Care   [name] has a current medication list which includes the following long-term medication(s): atorvastatin, calcium, diltiazem, escitalopram, fluticasone, gabapentin, hydralazine, hydrochlorothiazide, levothyroxine, mirabegron er, montelukast, omeprazole, potassium chloride, sodium chloride, and trazodone.   1.  Limited interventional options. 2.  Continue gabapentin as prescribed.  Future considerations could include Lyrica. 3.  Start hydrocodone as below. 4.  Follow-up in 1 month  Pharmacotherapy (Medications Ordered): Meds ordered this encounter  Medications  . HYDROcodone-acetaminophen (NORCO/VICODIN) 5-325 MG tablet    Sig: Take 1 tablet by mouth every 8 (eight)  hours as needed for severe pain. Must last 30 days.    Dispense:  90 tablet    Refill:  0    Chronic Pain. (STOP Act - Not applicable). Fill one day early if closed on scheduled refill date.   Orders:  Orders Placed This Encounter  Procedures  . Compliance Drug Analysis, Ur    Volume: 30 ml(s). Minimum 3 ml of urine is needed. Document temperature of fresh sample. Indications: Long term (current) use of opiate analgesic (Z79.891) Test#: [phone] (Comprehensive Profile)    Order Specific Question:   Release to patient    Answer:   Immediate   Follow-up plan:   Return in about 4 weeks (around 09/05/2020) for Medication Management, in person.   Recent Visits No visits were found meeting these conditions. Showing recent visits within past 90 days and meeting all other requirements Today's Visits Date Type Provider Dept  08/08/20 Office Visit Gillis Santa, MD [facility]  Showing today's visits and meeting all other requirements Future Appointments Date Type Provider Dept  11/02/20 Appointment Gillis Santa, MD [facility]  Showing future appointments within next 90 days and meeting all other requirements  I discussed the assessment and treatment plan with the patient. The patient was provided an opportunity to ask questions and all were answered. The patient agreed with the plan and demonstrated  an understanding of the instructions.  Patient advised to call back or seek an in-person evaluation if the symptoms or condition worsens.  Duration of encounter:30 minutes.  Note by: Gillis Santa, MD Date: 08/08/2020; Time: 9:58 AM

## 2020-08-10 DIAGNOSIS — R2681 Unsteadiness on feet: Secondary | ICD-10-CM | POA: Diagnosis not present

## 2020-08-10 DIAGNOSIS — I10 Essential (primary) hypertension: Secondary | ICD-10-CM | POA: Diagnosis not present

## 2020-08-10 DIAGNOSIS — E039 Hypothyroidism, unspecified: Secondary | ICD-10-CM | POA: Diagnosis not present

## 2020-08-10 DIAGNOSIS — M810 Age-related osteoporosis without current pathological fracture: Secondary | ICD-10-CM | POA: Diagnosis not present

## 2020-08-10 DIAGNOSIS — Z79899 Other long term (current) drug therapy: Secondary | ICD-10-CM | POA: Diagnosis not present

## 2020-08-14 DIAGNOSIS — M549 Dorsalgia, unspecified: Secondary | ICD-10-CM | POA: Diagnosis not present

## 2020-08-14 DIAGNOSIS — N183 Chronic kidney disease, stage 3 unspecified: Secondary | ICD-10-CM | POA: Diagnosis not present

## 2020-08-14 DIAGNOSIS — E039 Hypothyroidism, unspecified: Secondary | ICD-10-CM | POA: Diagnosis not present

## 2020-08-14 DIAGNOSIS — E538 Deficiency of other specified B group vitamins: Secondary | ICD-10-CM | POA: Diagnosis not present

## 2020-08-14 DIAGNOSIS — G8929 Other chronic pain: Secondary | ICD-10-CM | POA: Diagnosis not present

## 2020-08-14 DIAGNOSIS — K219 Gastro-esophageal reflux disease without esophagitis: Secondary | ICD-10-CM | POA: Diagnosis not present

## 2020-08-14 DIAGNOSIS — I129 Hypertensive chronic kidney disease with stage 1 through stage 4 chronic kidney disease, or unspecified chronic kidney disease: Secondary | ICD-10-CM | POA: Diagnosis not present

## 2020-08-15 DIAGNOSIS — Z1231 Encounter for screening mammogram for malignant neoplasm of breast: Secondary | ICD-10-CM | POA: Diagnosis not present

## 2020-08-15 LAB — COMPLIANCE DRUG ANALYSIS, UR

## 2020-08-24 DIAGNOSIS — M419 Scoliosis, unspecified: Secondary | ICD-10-CM | POA: Diagnosis not present

## 2020-08-24 DIAGNOSIS — M438X9 Other specified deforming dorsopathies, site unspecified: Secondary | ICD-10-CM | POA: Diagnosis not present

## 2020-08-30 ENCOUNTER — Ambulatory Visit: Payer: Medicare HMO | Admitting: Internal Medicine

## 2020-08-30 DIAGNOSIS — R2681 Unsteadiness on feet: Secondary | ICD-10-CM | POA: Diagnosis not present

## 2020-08-30 DIAGNOSIS — M5459 Other low back pain: Secondary | ICD-10-CM | POA: Diagnosis not present

## 2020-08-30 DIAGNOSIS — M6281 Muscle weakness (generalized): Secondary | ICD-10-CM | POA: Diagnosis not present

## 2020-08-30 DIAGNOSIS — R262 Difficulty in walking, not elsewhere classified: Secondary | ICD-10-CM | POA: Diagnosis not present

## 2020-08-31 DIAGNOSIS — R911 Solitary pulmonary nodule: Secondary | ICD-10-CM | POA: Diagnosis not present

## 2020-08-31 DIAGNOSIS — R918 Other nonspecific abnormal finding of lung field: Secondary | ICD-10-CM | POA: Diagnosis not present

## 2020-08-31 NOTE — Progress Notes (Signed)
Follow-up Outpatient Visit Date: 09/01/2020  Primary Care Provider: Melonie Florida, FNP [address] [place] [address]  Chief Complaint: Back pain  HPI:  Kimberly Cook is a 75 y.o. female with history of moderate coronary artery disease by cardiac CTA, hypertension, hyperlipidemia, "mini strokes," GERD, depression, and anxiety, who presents for follow-up of shortness of breath, hypertension, and coronary artery disease.  I last saw her over 2.5 years ago, at which time she was feeling well.  We did not make any medication changes or pursue additional testing.  6 month return visit was recommended, but Kimberly Cook was lost to follow-up.  Today, Kimberly Cook returns, most concerned about her worsening back pain.  She underwent spinal stimulator implantation last summer but feels like this actually made her back pain worse.  She expects to undergo back surgery at [facility] when COVID-19 precautions allow.  Kimberly Cook notes quite a bit of shortness of breath that she attributes to pain in her back.  She is sedentary, as severe back pain and sciatica involving the left leg limit her mobility.  She gets out of breath walking to the laundry room.  She denies chest pain and palpitations.  She has fallen at least 4 times in the last year but denies syncope.  Her blood pressure remains somewhat labile with occasional low readings with associated lightheadedness.  She has not needed to use her as needed hydralazine much.  She has chronic dependent edema that improves with compression stocking use.  She is scheduled to begin physical therapy next week for her back pain.  --------------------------------------------------------------------------------------------------  Cardiovascular History & Procedures: Cardiovascular Problems:  Orthostatic lightheadedness and labile blood pressure  Dyspnea on exertion  Risk Factors:  Hypertension, hyperlipidemia, family history, sedentary  lifestyle, and age greater than 33  Cath/PCI:  None  CV Surgery:  None  EP Procedures and Devices:  14-day event monitor (04/24/17): Sinus rhythm without arrhythmias.  48 hour Holter monitor (03/20/17): Predominantly sinus rhythm with occasional PACs and rare PVCs. Brief episode of blocked PACs versus 2:1 AV block.  Non-Invasive Evaluation(s):  Renal artery Doppler (12/26/17): No evidence of renal artery stenosis.  Incidental note made of 2.1 cm left renal cyst.  Carotid Doppler (09/08/17): Minimal plaquing of both carotid arteries. No significant stenosis. Patent vertebral arteries with antegrade flow bilaterally. Normal hemodynamics in both subclavian arteries.  Coronary CTA (04/30/17):Mild to moderate coronary artery disease proximal LAD and ostium of small D2. CT FFR of distal most LAD is 0.79. Coronary calcium score 257. Small left lower lobe nodule (3 mm).  TTE (03/20/17): Normal LV size with mild focal basal hypertrophy of the septum. LVEF 50-55% with normal wall motion. Grade 1 diastolic dysfunction. Mitral annular calcification present. Normal RV size and function. Normal pulmonary artery pressure.  Pharmacologic MPI (12/14/15): Low risk without ischemia or scar. LVEF 68%.  TTE (12/14/15): Normal LV size and wall thickness. LVEF 55-60%.Normal RV size and function. No significant valvular abnormalities.   Recent CV Pertinent Labs: Lab Results  Component Value Date   CHOL 125 11/24/2019   CHOL 119 08/22/2017   HDL 46.40 11/24/2019   HDL 57 08/22/2017   LDLCALC 47 11/24/2019   LDLCALC 46 08/22/2017   TRIG 158.0 (H) 11/24/2019   CHOLHDL 3 11/24/2019   INR 1.0 06/20/2020   K 4.0 06/20/2020   MG 1.9 05/19/2018   BUN 13 06/20/2020   BUN 17 04/11/2017   CREATININE 0.87 06/20/2020   CREATININE 1.18 (H) 01/07/2017  Past medical and surgical history were reviewed and updated in EPIC.  Current Meds  Medication Sig   acetaminophen (TYLENOL) 500 MG tablet Take  1,000 mg by mouth every 8 (eight) hours as needed for moderate pain.   alendronate (FOSAMAX) 70 MG tablet Take 70 mg by mouth once a week.    aspirin EC 81 MG tablet Take 81 mg by mouth at bedtime. Swallow whole.   atorvastatin (LIPITOR) 20 MG tablet Take 1 tablet (20 mg total) by mouth daily.   Biotin 1 MG CAPS Take 1 mg by mouth daily.    CALCIUM PO Take 1 tablet by mouth daily.    cetirizine (ZYRTEC) 10 MG tablet Take 10 mg by mouth every morning.   Cholecalciferol (VITAMIN D3) 125 MCG (5000 UT) TABS Take 1 tablet (5,000 Units total) by mouth daily.   Cranberry 500 MG CAPS Take 500 mg by mouth daily.   diltiazem (CARDIZEM SR) 60 MG 12 hr capsule Take 1 capsule (60 mg total) by mouth 2 (two) times daily.   escitalopram (LEXAPRO) 5 MG tablet Take 1 tablet (5 mg total) by mouth every morning.   gabapentin (NEURONTIN) 300 MG capsule 1 pill in the am and lunch and 2 pills qhs (Patient taking differently: Take 300-600 mg by mouth See admin instructions. Take 300 mg by mouth in the morning and lunch and 600 mg at bedtime)   hydrALAZINE (APRESOLINE) 10 MG tablet Take 2 tablets (20 mg total) by mouth 2 (two) times daily as needed. BP>140/>90   hydrochlorothiazide (MICROZIDE) 12.5 MG capsule Take 1 capsule (12.5 mg total) by mouth daily. In am   HYDROcodone-acetaminophen (NORCO/VICODIN) 5-325 MG tablet Take 1 tablet by mouth every 8 (eight) hours as needed for severe pain. Must last 30 days.   levothyroxine (SYNTHROID) 88 MCG tablet Take 1 tablet (88 mcg total) by mouth daily before breakfast. Skip sundays   lidocaine (LIDODERM) 5 % Place 1-2 patches onto the skin daily as needed (pain). Remove & Discard patch within 12 hours or as directed by MD   LORazepam (ATIVAN) 0.5 MG tablet Take 1 tablet (0.5 mg total) by mouth 2 (two) times daily as needed for anxiety or sleep. Or 1 mg at night for sleep   mirabegron ER (MYRBETRIQ) 50 MG TB24 tablet Take 1 tablet (50 mg total) by mouth daily.  (Patient taking differently: Take 50 mg by mouth every morning.)   montelukast (SINGULAIR) 10 MG tablet Take 1 tablet (10 mg total) by mouth at bedtime.   naloxegol oxalate (MOVANTIK) 12.5 MG TABS tablet Take 1-2 tablets (12.5-25 mg total) by mouth daily. Constipation x 3 days prn   omeprazole (PRILOSEC) 40 MG capsule Take 1 capsule (40 mg total) by mouth daily. After lunch   polyethylene glycol powder (GLYCOLAX/MIRALAX) 17 GM/SCOOP powder Take 17 g by mouth daily as needed for moderate constipation.   potassium chloride (KLOR-CON) 10 MEQ tablet Take 1 tablet (10 mEq total) by mouth daily.   Probiotic Product (HEALTHY COLON PO) Take 1 capsule by mouth daily.   sodium chloride (OCEAN) 0.65 % SOLN nasal spray Place 2 sprays into both nostrils as needed for congestion. Use 1st   tiZANidine (ZANAFLEX) 4 MG tablet Take 1 tablet (4 mg total) by mouth 2 (two) times daily as needed for muscle spasms.   traZODone (DESYREL) 50 MG tablet Take 2 tablets (100 mg total) by mouth at bedtime. 1 hr before bed   Turmeric Curcumin 500 MG CAPS Take 500 mg by mouth  daily.   Vaginal Lubricant (REPLENS) GEL Place 1 application vaginally every 3 (three) days.    vitamin B-12 (CYANOCOBALAMIN) 1000 MCG tablet Take 1 tablet (1,000 mcg total) by mouth daily.    Allergies: Patient has no known allergies.  Social History   Tobacco Use   Smoking status: Passive Smoke Exposure - Never Smoker   Smokeless tobacco: Never Used   Tobacco comment: husbands and children smoked in home.   Vaping Use   Vaping Use: Never used  Substance Use Topics   Alcohol use: No   Drug use: No    Family History  Problem Relation Age of Onset   Heart disease Mother    Hypertension Mother    Diabetes Mother    Heart attack Mother 22   Heart disease Father    Heart attack Father 46   Breast cancer Maternal Aunt    Heart attack Brother     Review of Systems: A 12-system review of systems was performed and  was negative except as noted in the HPI.  --------------------------------------------------------------------------------------------------  Physical Exam: BP 128/80 (BP Location: Left Arm, Patient Position: Sitting, Cuff Size: Large)    Pulse 73    Ht 5\' 3"  (1.6 m)    Wt 171 lb 4 oz (77.7 kg)    SpO2 98%    BMI 30.34 kg/m   General:  NAD. Neck: No JVD or HJR. Lungs: Clear to auscultation bilaterally without wheezes or crackles. Heart: Distant heart sounds.  Regular rate and rhythm without murmurs, rubs, or gallops. Abdomen: Soft, nontender, nondistended. Extremities: Trace pedal edema.  EKG: Normal sinus rhythm with poor R wave progression, new from prior tracing on 06/20/2020.  Lab Results  Component Value Date   WBC 3.9 (L) 06/02/2020   HGB 13.2 06/02/2020   HCT 38.3 06/02/2020   MCV 92.8 06/02/2020   PLT 157.0 06/02/2020    Lab Results  Component Value Date   NA 138 06/20/2020   K 4.0 06/20/2020   CL 103 06/20/2020   CO2 25 06/20/2020   BUN 13 06/20/2020   CREATININE 0.87 06/20/2020   GLUCOSE 98 06/20/2020   ALT 16 10/19/2019    Lab Results  Component Value Date   CHOL 125 11/24/2019   HDL 46.40 11/24/2019   LDLCALC 47 11/24/2019   TRIG 158.0 (H) 11/24/2019   CHOLHDL 3 11/24/2019    --------------------------------------------------------------------------------------------------  ASSESSMENT AND PLAN: Coronary artery disease with stable angina and shortness of breath: Prior work-up was notable for mild to moderate LAD disease on cardiac CTA in 2018.  Kimberly Cook denies angina but has experienced worsening shortness of breath.  While her back pain and associated deconditioning may be contributing, she is at risk for progression of her CAD.  She appears euvolemic on examination today other than trace dependent edema.  Her EKG shows poor R wave progression that is new from prior tracings.  While this may reflect lead placement, in the setting of previously mild  to moderate LAD stenosis by CTA and likely upcoming back surgery, I think it would be prudent to exclude ischemia/scar.  We have therefore agreed to perform a pharmacologic myocardial perfusion stress test.  We will defer medication changes today.  Shared Decision Making/Informed Consent The risks [chest pain, shortness of breath, cardiac arrhythmias, dizziness, blood pressure fluctuations, myocardial infarction, stroke/transient ischemic attack, nausea, vomiting, allergic reaction, radiation exposure, metallic taste sensation and life-threatening complications (estimated to be 1 in 10,000)], benefits (risk stratification, diagnosing coronary artery disease, treatment guidance)  and alternatives of a nuclear stress test were discussed in detail with Kimberly Cook and she agrees to proceed.  Hypertension: Blood pressure well controlled though still somewhat labile at home.  We will not make any medication changes at this time.  Hyperlipidemia: LDL at goal on last check.  Continue atorvastatin 20 mg daily.  Follow-up: Return to clinic in 6 months.  Yvonne Kendall, MD 09/01/2020 9:38 AM

## 2020-09-01 ENCOUNTER — Other Ambulatory Visit: Payer: Self-pay

## 2020-09-01 ENCOUNTER — Encounter: Payer: Self-pay | Admitting: Internal Medicine

## 2020-09-01 ENCOUNTER — Ambulatory Visit: Payer: Medicare HMO | Admitting: Internal Medicine

## 2020-09-01 VITALS — BP 128/80 | HR 73 | Ht 63.0 in | Wt 171.2 lb

## 2020-09-01 DIAGNOSIS — I25118 Atherosclerotic heart disease of native coronary artery with other forms of angina pectoris: Secondary | ICD-10-CM | POA: Diagnosis not present

## 2020-09-01 DIAGNOSIS — E785 Hyperlipidemia, unspecified: Secondary | ICD-10-CM

## 2020-09-01 DIAGNOSIS — R0602 Shortness of breath: Secondary | ICD-10-CM

## 2020-09-01 DIAGNOSIS — R0989 Other specified symptoms and signs involving the circulatory and respiratory systems: Secondary | ICD-10-CM | POA: Diagnosis not present

## 2020-09-01 NOTE — Patient Instructions (Addendum)
Medication Instructions:  Your physician recommends that you continue on your current medications as directed. Please refer to the Current Medication list given to you today.  *If you need a refill on your cardiac medications before your next appointment, please call your pharmacy*   Lab Work: none If you have labs (blood work) drawn today and your tests are completely normal, you will receive your results only by: Marland Kitchen MyChart Message (if you have MyChart) OR . A paper copy in the mail If you have any lab test that is abnormal or we need to change your treatment, we will call you to review the results.   Testing/Procedures: [facility] LEXISCAN MYOVIEW  Your caregiver has ordered a Stress Test with nuclear imaging. The purpose of this test is to evaluate the blood supply to your heart muscle. This procedure is referred to as a "Non-Invasive Stress Test." This is because other than having an IV started in your vein, nothing is inserted or "invades" your body. Cardiac stress tests are done to find areas of poor blood flow to the heart by determining the extent of coronary artery disease (CAD). Some patients exercise on a treadmill, which naturally increases the blood flow to your heart, while others who are  unable to walk on a treadmill due to physical limitations have a pharmacologic/chemical stress agent called Lexiscan . This medicine will mimic walking on a treadmill by temporarily increasing your coronary blood flow.   Please note: these test may take anywhere between 2-4 hours to complete  PLEASE REPORT TO [facility] ENTRANCE  THE VOLUNTEERS AT THE FIRST DESK WILL DIRECT YOU WHERE TO GO  Date of Procedure:_____________________________________  Arrival Time for Procedure:______________________________  Instructions regarding medication:   _xx_:  Hold other medications as follows:_____HCTZ_________  PLEASE NOTIFY THE OFFICE AT LEAST 24 HOURS IN ADVANCE IF YOU ARE UNABLE TO KEEP YOUR  APPOINTMENT.  [phone] AND  PLEASE NOTIFY NUCLEAR MEDICINE AT [facility] AT LEAST 24 HOURS IN ADVANCE IF YOU ARE UNABLE TO KEEP YOUR APPOINTMENT. [phone]  How to prepare for your Myoview test:  1. Do not eat or drink after midnight 2. No caffeine for 24 hours prior to test 3. No smoking 24 hours prior to test. 4. Your medication may be taken with water.  If your doctor stopped a medication because of this test, do not take that medication. 5. Ladies, please do not wear dresses.  Skirts or pants are appropriate. Please wear a short sleeve shirt. 6. No perfume, cologne or lotion. 7. Wear comfortable walking shoes. No heels!   Follow-Up: At [facility], you and your health needs are our priority.  As part of our continuing mission to provide you with exceptional heart care, we have created designated Provider Care Teams.  These Care Teams include your primary Cardiologist (physician) and Advanced Practice Providers (APPs -  Physician Assistants and Nurse Practitioners) who all work together to provide you with the care you need, when you need it.  We recommend signing up for the patient portal called "MyChart".  Sign up information is provided on this After Visit Summary.  MyChart is used to connect with patients for Virtual Visits (Telemedicine).  Patients are able to view lab/test results, encounter notes, upcoming appointments, etc.  Non-urgent messages can be sent to your provider as well.   To learn more about what you can do with MyChart, go to [email or web address].    Your next appointment:   6 month(s)  The format for your next  appointment:   In Person  Provider:   You may see Yvonne Kendall, MD or one of the following Advanced Practice Providers on your designated Care Team:    Nicolasa Ducking, NP  Eula Listen, PA-C  Marisue Ivan, PA-C  Cadence Fransico Michael, [place]  Gillian Shields, NP    Cardiac Nuclear Scan A cardiac nuclear scan is a test that measures blood  flow to the heart when a person is resting and when he or she is exercising. The test looks for problems such as:  Not enough blood reaching a portion of the heart.  The heart muscle not working normally. You may need this test if:  You have heart disease.  You have had abnormal lab results.  You have had heart surgery or a balloon procedure to open up blocked arteries (angioplasty).  You have chest pain.  You have shortness of breath. In this test, a radioactive dye (tracer) is injected into your bloodstream. After the tracer has traveled to your heart, an imaging device is used to measure how much of the tracer is absorbed by or distributed to various areas of your heart. This procedure is usually done at a hospital and takes 2-4 hours. Tell a health care provider about:  Any allergies you have.  All medicines you are taking, including vitamins, herbs, eye drops, creams, and over-the-counter medicines.  Any problems you or family members have had with anesthetic medicines.  Any blood disorders you have.  Any surgeries you have had.  Any medical conditions you have.  Whether you are pregnant or may be pregnant. What are the risks? Generally, this is a safe procedure. However, problems may occur, including:  Serious chest pain and heart attack. This is only a risk if the stress portion of the test is done.  Rapid heartbeat.  Sensation of warmth in your chest. This usually passes quickly.  Allergic reaction to the tracer. What happens before the procedure?  Ask your health care provider about changing or stopping your regular medicines. This is especially important if you are taking diabetes medicines or blood thinners.  Follow instructions from your health care provider about eating or drinking restrictions.  Remove your jewelry on the day of the procedure. What happens during the procedure?  An IV will be inserted into one of your veins.  Your health care  provider will inject a small amount of radioactive tracer through the IV.  You will wait for 20-40 minutes while the tracer travels through your bloodstream.  Your heart activity will be monitored with an electrocardiogram (ECG).  You will lie down on an exam table.  Images of your heart will be taken for about 15-20 minutes.  You may also have a stress test. For this test, one of the following may be done: ? You will exercise on a treadmill or stationary bike. While you exercise, your heart's activity will be monitored with an ECG, and your blood pressure will be checked. ? You will be given medicines that will increase blood flow to parts of your heart. This is done if you are unable to exercise.  When blood flow to your heart has peaked, a tracer will again be injected through the IV.  After 20-40 minutes, you will get back on the exam table and have more images taken of your heart.  Depending on the type of tracer used, scans may need to be repeated 3-4 hours later.  Your IV line will be removed when the procedure is over.  The procedure may vary among health care providers and hospitals. What happens after the procedure?  Unless your health care provider tells you otherwise, you may return to your normal schedule, including diet, activities, and medicines.  Unless your health care provider tells you otherwise, you may increase your fluid intake. This will help to flush the contrast dye from your body. Drink enough fluid to keep your urine pale yellow.  Ask your health care provider, or the department that is doing the test: ? When will my results be ready? ? How will I get my results? Summary  A cardiac nuclear scan measures the blood flow to the heart when a person is resting and when he or she is exercising.  Tell your health care provider if you are pregnant.  Before the procedure, ask your health care provider about changing or stopping your regular medicines. This is  especially important if you are taking diabetes medicines or blood thinners.  After the procedure, unless your health care provider tells you otherwise, increase your fluid intake. This will help flush the contrast dye from your body.  After the procedure, unless your health care provider tells you otherwise, you may return to your normal schedule, including diet, activities, and medicines. This information is not intended to replace advice given to you by your health care provider. Make sure you discuss any questions you have with your health care provider. Document Revised: 01/05/2018 Document Reviewed: 01/05/2018 Elsevier Patient Education  2021 [employer].

## 2020-09-05 ENCOUNTER — Encounter: Payer: Self-pay | Admitting: Student in an Organized Health Care Education/Training Program

## 2020-09-05 ENCOUNTER — Ambulatory Visit
Payer: Medicare HMO | Attending: Student in an Organized Health Care Education/Training Program | Admitting: Student in an Organized Health Care Education/Training Program

## 2020-09-05 ENCOUNTER — Other Ambulatory Visit: Payer: Self-pay

## 2020-09-05 VITALS — BP 145/93 | HR 88 | Temp 97.1°F | Resp 16 | Ht 63.0 in | Wt 160.0 lb

## 2020-09-05 DIAGNOSIS — M47816 Spondylosis without myelopathy or radiculopathy, lumbar region: Secondary | ICD-10-CM | POA: Diagnosis not present

## 2020-09-05 DIAGNOSIS — T85192D Other mechanical complication of implanted electronic neurostimulator (electrode) of spinal cord, subsequent encounter: Secondary | ICD-10-CM

## 2020-09-05 DIAGNOSIS — G894 Chronic pain syndrome: Secondary | ICD-10-CM | POA: Diagnosis not present

## 2020-09-05 DIAGNOSIS — M48062 Spinal stenosis, lumbar region with neurogenic claudication: Secondary | ICD-10-CM | POA: Diagnosis not present

## 2020-09-05 DIAGNOSIS — G8929 Other chronic pain: Secondary | ICD-10-CM | POA: Insufficient documentation

## 2020-09-05 DIAGNOSIS — M5416 Radiculopathy, lumbar region: Secondary | ICD-10-CM | POA: Diagnosis not present

## 2020-09-05 MED ORDER — HYDROCODONE-ACETAMINOPHEN 5-325 MG PO TABS: 1.0000 | ORAL_TABLET | Freq: Three times a day (TID) | ORAL | 0 refills | Status: AC | PRN

## 2020-09-05 MED ORDER — HYDROCODONE-ACETAMINOPHEN 5-325 MG PO TABS: 1.0000 | ORAL_TABLET | Freq: Three times a day (TID) | ORAL | 0 refills | Status: DC | PRN

## 2020-09-05 NOTE — Progress Notes (Signed)
PROVIDER NOTE: Information contained herein reflects review and annotations entered in association with encounter. Interpretation of such information and data should be left to medically-trained personnel. Information provided to patient can be located elsewhere in the medical record under "Patient Instructions". Document created using STT-dictation technology, any transcriptional errors that may result from process are unintentional.    Patient: Kimberly Cook  Service Category: E/M  Provider: Gillis Santa, MD  DOB: [birth year]  DOS: 09/05/2020  Specialty: Interventional Pain Management  MRN: [id number]  Setting: Ambulatory outpatient  PCP: Virgie Dad, FNP  Type: Established Patient    Referring Provider: Orland Mustard *  Location: Office  Delivery: Face-to-face     HPI  [name], a 75 y.o. year old female, is here today because of her Failed spinal cord stimulator, subsequent encounter [T85.192D]. Kimberly Cook primary complain today is Back Pain (Left lower) Last encounter: My last encounter with her was on 08/08/2020. Pertinent problems: Kimberly Cook has Chronic pain; Chronic back pain; Lumbar spondylosis; Lumbar herniated disc; Lumbar degenerative disc disease; Chronic radicular lumbar pain; Lumbar facet arthropathy; and Spinal stenosis, lumbar region, with neurogenic claudication on their pertinent problem list. Pain Assessment: Severity of Chronic pain is reported as a 10-Worst pain ever/10. Location: Back Left/down left leg. Onset: More than a month ago. Quality: Aching. Timing: Constant. Modifying factor(s): "nothing really". Vitals:  height is 5' 3"  (1.6 m) and weight is 160 lb (72.6 kg). Her temporal temperature is 97.1 F (36.2 C) (abnormal). Her blood pressure is 145/93 (abnormal) and her pulse is 88. Her respiration is 16 and oxygen saturation is 100%.   Reason for encounter: medication management.    Patient presents today for medication management.  She was  last seen by me on 08/08/2020.  At that time, she received her first prescription of an opioid analgesic from our clinic at hydrocodone 5 mg 3 times daily as needed.  She follows up today endorsing moderate to severe pain.  She has not been taking her medications as prescribed as she has 38 tablets still left.  When asked how she takes her medications, patient states that sometimes she forgets to take her afternoon dose.  We discussed the concept of preemptive analgesia and I recommend that she take her hydrocodone up to 3 times a day to help manage her pain.  She saw Dr. Cari Caraway was recommended physical therapy and may consider extensive thoracic and lumbar spinal fusion for her condition later this year.  I have provided clear instructions to the patient on how to correctly take her hydrocodone.  She endorses understanding.  She will follow-up in 10 weeks for medication management.  Pharmacotherapy Assessment   08/08/2020  08/08/2020   4  Hydrocodone-Acetamin 5-325 Mg  90.00  30  Bi Lat  [id number]  Thr (4878)  0/0  15.00 MME  Medicare  [place]      Analgesic: Hydrocodone 5 mg 3 times daily as needed, quantity 90/month    Monitoring: [place] PMP: PDMP reviewed during this encounter.       Pharmacotherapy: No side-effects or adverse reactions reported. Compliance: No problems identified. Effectiveness: Clinically acceptable.  Rise Patience, RN  09/05/2020 11:19 AM  Sign when Signing Visit Nursing Pain Medication Assessment:  Safety precautions to be maintained throughout the outpatient stay will include: orient to surroundings, keep bed in low position, maintain call bell within reach at all times, provide assistance with transfer out of bed and ambulation.  Medication Inspection Compliance: Pill count conducted under  aseptic conditions, in front of the patient. Neither the pills nor the bottle was removed from the patient's sight at any time. Once count was completed pills were immediately returned to the  patient in their original bottle.  Medication: Hydrocodone/APAP Pill/Patch Count: 38 of 90 pills remain Pill/Patch Appearance: Markings consistent with prescribed medication Bottle Appearance: Standard pharmacy container. Clearly labeled. Filled Date: 1 / 4 / 22 Last Medication intake:  Today    UDS:  Summary  Date Value Ref Range Status  08/08/2020 Note  Final    Comment:    ==================================================================== Compliance Drug Analysis, Ur ==================================================================== Test                             Result       Flag       Units  Drug Present and Declared for Prescription Verification   Lorazepam                      433          EXPECTED   ng/mg creat    Source of lorazepam is a scheduled prescription medication.    Gabapentin                     PRESENT      EXPECTED   Tizanidine                     PRESENT      EXPECTED   Citalopram                     PRESENT      EXPECTED   Desmethylcitalopram            PRESENT      EXPECTED    Desmethylcitalopram is an expected metabolite of citalopram or the    enantiomeric form, escitalopram.    Trazodone                      PRESENT      EXPECTED   1,3 chlorophenyl piperazine    PRESENT      EXPECTED    1,3-chlorophenyl piperazine is an expected metabolite of trazodone.    Acetaminophen                  PRESENT      EXPECTED   Diltiazem                      PRESENT      EXPECTED  Drug Absent but Declared for Prescription Verification   Hydrocodone                    Not Detected UNEXPECTED ng/mg creat   Salicylate                     Not Detected UNEXPECTED    Aspirin, as indicated in the declared medication list, is not always    detected even when used as directed.    Lidocaine                      Not Detected UNEXPECTED    Lidocaine, as indicated in the declared medication list, is not    always detected even when used as  directed.  ==================================================================== Test  Result    Flag   Units      Ref Range   Creatinine              189              mg/dL      >=20 ==================================================================== Declared Medications:  The flagging and interpretation on this report are based on the  following declared medications.  Unexpected results may arise from  inaccuracies in the declared medications.   **Note: The testing scope of this panel includes these medications:   Diltiazem (Cardizem)  Escitalopram (Lexapro)  Gabapentin (Neurontin)  Hydrocodone (Norco)  Lorazepam (Ativan)  Trazodone (Desyrel)   **Note: The testing scope of this panel does not include small to  moderate amounts of these reported medications:   Acetaminophen (Tylenol)  Acetaminophen (Norco)  Aspirin  Lidocaine  Tizanidine (Zanaflex)   **Note: The testing scope of this panel does not include the  following reported medications:   Alendronate (Fosamax)  Atorvastatin (Lipitor)  Biotin  Cetirizine (Zyrtec)  Cranberry  Cyanocobalamin  Fluticasone (Flonase)  Folic Acid  Hydralazine (Apresoline)  Hydrochlorothiazide (Microzide)  Levothyroxine (Synthroid)  Mirabegron (Myrbetriq)  Montelukast (Singulair)  Naloxegol (Movantik)  Nitrofurantoin (Macrobid)  Omeprazole (Prilosec)  Polyethylene Glycol  Potassium (Klor-Con)  Sodium Chloride  Topical  Turmeric  Vitamin D3 ==================================================================== For clinical consultation, please call [phone]. ====================================================================      ROS  Constitutional: Denies any fever or chills Gastrointestinal: No reported hemesis, hematochezia, vomiting, or acute GI distress Musculoskeletal: Left low back, left leg pain Neurological: No reported episodes of acute onset apraxia, aphasia, dysarthria, agnosia,  amnesia, paralysis, loss of coordination, or loss of consciousness  Medication Review  Biotin, Calcium, Cranberry, HYDROcodone-acetaminophen, LORazepam, Probiotic Product, Replens, Turmeric Curcumin, Vitamin D3, acetaminophen, alendronate, aspirin EC, atorvastatin, cetirizine, diltiazem, escitalopram, folic acid, gabapentin, hydrALAZINE, hydrochlorothiazide, levothyroxine, lidocaine, mirabegron ER, montelukast, naloxegol oxalate, omeprazole, polyethylene glycol powder, potassium chloride, sodium chloride, tiZANidine, traZODone, and vitamin B-12  History Review  Allergy: Kimberly Cook has No Known Allergies. Drug: Kimberly Cook  reports no history of drug use. Alcohol:  reports no history of alcohol use. Tobacco:  reports that she is a non-smoker but has been exposed to tobacco smoke. She has never used smokeless tobacco. Social: Kimberly Cook  reports that she is a non-smoker but has been exposed to tobacco smoke. She has never used smokeless tobacco. She reports that she does not drink alcohol and does not use drugs. Medical:  has a past medical history of Allergy, Anxiety, Arthritis, Back pain, Chronic kidney disease, Coronary artery disease (04/2017), DDD (degenerative disc disease), lumbar, Depression, Diverticulosis, Essential hypertension, GERD (gastroesophageal reflux disease), Headache, History of shingles, Hyperlipidemia, Hypothyroidism, Lung nodule, Mini stroke ([place]) (2011 ), Occipital neuralgia, Palpitations, Pneumonia (2018), Prediabetes, Stroke [facility]), Stroke [facility]), Syncope (2019), Urinary tract infection, and Vitamin D deficiency. Surgical: Kimberly Cook  has a past surgical history that includes Cholecystectomy; Bladder surgery; Tonsillectomy and adenoidectomy; Knee arthroscopy (Left, 2011); Abdominal hysterectomy; gastroplication ; Total knee arthroplasty (Left, 04/17/2015); Breast excisional biopsy (Right, Over 20 years ); Joint replacement (Left); Thoracic laminectomy for spinal cord  stimulator (N/A, 01/24/2020); Pulse generator implant (Left, 01/31/2020); right arm fracture; Pulse generator implant (Left, 04/24/2020); and Spinal cord stimulator removal (N/A, 06/26/2020). Family: family history includes Breast cancer in her maternal aunt; Diabetes in her mother; Heart attack in her brother; Heart attack (age of onset: 84) in her mother; Heart attack (age of onset: 66) in her father; Heart disease in her father  and mother; Hypertension in her mother.  Laboratory Chemistry Profile   Renal Lab Results  Component Value Date   BUN 13 06/20/2020   CREATININE 0.87 06/20/2020   LABCREA 119 12/16/2017   BCR 17 04/11/2017   GFR 56.82 (L) 10/09/2018   GFRAA >60 04/19/2020   GFRNONAA >60 06/20/2020   EPIRU see note 12/16/2017   EPINEPH24HUR 3 09/29/2017   NOREPRU 13 09/29/2017   NOREPI24HUR 20 09/29/2017   DOPARU 313 12/16/2017   DOPAM24HRUR 215 09/29/2017     Hepatic Lab Results  Component Value Date   AST 17 10/19/2019   ALT 16 10/19/2019   ALBUMIN 4.0 10/19/2019   ALKPHOS 48 10/19/2019   LIPASE 27 10/19/2019     Electrolytes Lab Results  Component Value Date   NA 138 06/20/2020   K 4.0 06/20/2020   CL 103 06/20/2020   CALCIUM 9.0 06/20/2020   MG 1.9 05/19/2018   PHOS 2.7 04/25/2008     Bone Lab Results  Component Value Date   VD25OH 70.87 05/19/2018     Inflammation (CRP: Acute Phase) (ESR: Chronic Phase) Lab Results  Component Value Date   LATICACIDVEN 1.6 12/26/2009       Note: Above Lab results reviewed.  Recent Imaging Review  DG C-Arm 1-60 Min CLINICAL DATA:  Spinal cord stimulator placement.  EXAM: DG C-ARM 1-60 MIN; THORACIC SPINE 2 VIEWS  COMPARISON:  CT thoracic spine dated November 03, 2019.  FLUOROSCOPY TIME:  48 seconds.  C-arm fluoroscopic images were obtained intraoperatively and submitted for post operative interpretation.  FINDINGS: Multiple intraoperative fluoroscopic images of the thoracic spine demonstrate placement of a  spinal cord stimulator terminating at T7-T8. Postsurgical changes in the upper abdomen.  IMPRESSION: Intraoperative fluoroscopic guidance for thoracic spinal cord stimulator placement.  Electronically Signed   By: Titus Dubin M.D.   On: 01/24/2020 15:10 DG C-Arm 1-60 Min CLINICAL DATA:  Spinal cord stimulator placement.  EXAM: DG C-ARM 1-60 MIN; THORACIC SPINE 2 VIEWS  COMPARISON:  CT thoracic spine dated November 03, 2019.  FLUOROSCOPY TIME:  48 seconds.  C-arm fluoroscopic images were obtained intraoperatively and submitted for post operative interpretation.  FINDINGS: Multiple intraoperative fluoroscopic images of the thoracic spine demonstrate placement of a spinal cord stimulator terminating at T7-T8. Postsurgical changes in the upper abdomen.  IMPRESSION: Intraoperative fluoroscopic guidance for thoracic spinal cord stimulator placement.  Electronically Signed   By: Titus Dubin M.D.   On: 01/24/2020 15:10 DG Thoracic Spine 2 View CLINICAL DATA:  Spinal cord stimulator placement.  EXAM: DG C-ARM 1-60 MIN; THORACIC SPINE 2 VIEWS  COMPARISON:  CT thoracic spine dated November 03, 2019.  FLUOROSCOPY TIME:  48 seconds.  C-arm fluoroscopic images were obtained intraoperatively and submitted for post operative interpretation.  FINDINGS: Multiple intraoperative fluoroscopic images of the thoracic spine demonstrate placement of a spinal cord stimulator terminating at T7-T8. Postsurgical changes in the upper abdomen.  IMPRESSION: Intraoperative fluoroscopic guidance for thoracic spinal cord stimulator placement.  Electronically Signed   By: Titus Dubin M.D.   On: 01/24/2020 15:10 DG C-Arm 1-60 Min CLINICAL DATA:  Spinal cord stimulator placement.  EXAM: DG C-ARM 1-60 MIN; THORACIC SPINE 2 VIEWS  COMPARISON:  CT thoracic spine dated November 03, 2019.  FLUOROSCOPY TIME:  48 seconds.  C-arm fluoroscopic images were obtained intraoperatively  and submitted for post operative interpretation.  FINDINGS: Multiple intraoperative fluoroscopic images of the thoracic spine demonstrate placement of a spinal cord stimulator terminating at T7-T8. Postsurgical changes in the  upper abdomen.  IMPRESSION: Intraoperative fluoroscopic guidance for thoracic spinal cord stimulator placement.  Electronically Signed   By: Titus Dubin M.D.   On: 01/24/2020 15:10 Note: Reviewed        Physical Exam  General appearance: Well nourished, well developed, and well hydrated. In no apparent acute distress Mental status: Alert, oriented x 3 (person, place, & time)       Respiratory: No evidence of acute respiratory distress Eyes: PERLA Vitals: BP (!) 145/93 (BP Location: Right Arm, Patient Position: Sitting, Cuff Size: Large)   Pulse 88   Temp (!) 97.1 F (36.2 C) (Temporal)   Resp 16   Ht 5' 3"  (1.6 m)   Wt 160 lb (72.6 kg)   SpO2 100%   BMI 28.34 kg/m  BMI: Estimated body mass index is 28.34 kg/m as calculated from the following:   Height as of this encounter: 5' 3"  (1.6 m).   Weight as of this encounter: 160 lb (72.6 kg). Ideal: Ideal body weight: 52.4 kg (115 lb 8.3 oz) Adjusted ideal body weight: 60.5 kg (133 lb 5 oz)   Thoracic spine: Surgical scar present.  Scoliosis noted.  Mild tenderness noted along the scar. Lumbar spine: Limited range of motion, pain with lumbar extension.  Scoliosis noted. Lower extremity: 4 out of 5 strength bilateral lower extremity: Plantar flexion, dorsiflexion, knee flexion, knee extension, antalgic gait, ambulates with walker, instability while walking yes Neuropathic pain pattern noted of lower extremity  Assessment   Status Diagnosis  Persistent Persistent Persistent 1. Failed spinal cord stimulator, subsequent encounter   2. Chronic radicular lumbar pain   3. Lumbar radiculopathy   4. Lumbar facet arthropathy   5. Lumbar spondylosis   6. Spinal stenosis, lumbar region, with neurogenic  claudication   7. Chronic pain syndrome       Plan of Care   Kimberly Cook has a current medication list which includes the following long-term medication(s): atorvastatin, calcium, diltiazem, escitalopram, gabapentin, hydralazine, hydrochlorothiazide, levothyroxine, mirabegron er, montelukast, omeprazole, potassium chloride, sodium chloride, and trazodone.  Pharmacotherapy (Medications Ordered): Meds ordered this encounter  Medications  . HYDROcodone-acetaminophen (NORCO/VICODIN) 5-325 MG tablet    Sig: Take 1 tablet by mouth every 8 (eight) hours as needed for severe pain. Must last 30 days.    Dispense:  90 tablet    Refill:  0    Chronic Pain. (STOP Act - Not applicable). Fill one day early if closed on scheduled refill date.  Marland Kitchen HYDROcodone-acetaminophen (NORCO/VICODIN) 5-325 MG tablet    Sig: Take 1 tablet by mouth every 8 (eight) hours as needed for severe pain. Must last 30 days.    Dispense:  90 tablet    Refill:  0    Chronic Pain. (STOP Act - Not applicable). Fill one day early if closed on scheduled refill date.  Continue gabapentin as prescribed, 20 mg every morning, 300 mg every afternoon, 600 mg nightly.  Continue Movantik for opioid-induced constipation.  Follow-up plan:   Return in about 10 weeks (around 11/14/2020) for Medication Management, in person.   Recent Visits Date Type Provider Dept  08/08/20 Office Visit Gillis Santa, MD [facility]  Showing recent visits within past 90 days and meeting all other requirements Today's Visits Date Type Provider Dept  09/05/20 Office Visit Gillis Santa, MD [facility]  Showing today's visits and meeting all other requirements Future Appointments No visits were found meeting these conditions. Showing future appointments within next 90 days and meeting  all other requirements  I discussed the assessment and treatment plan with the patient. The patient was provided an opportunity to ask questions  and all were answered. The patient agreed with the plan and demonstrated an understanding of the instructions.  Patient advised to call back or seek an in-person evaluation if the symptoms or condition worsens.  Duration of encounter: 62mnutes.  Note by: BGillis Santa MD Date: 09/05/2020; Time: 11:46 AM

## 2020-09-05 NOTE — Progress Notes (Signed)
Nursing Pain Medication Assessment:  Safety precautions to be maintained throughout the outpatient stay will include: orient to surroundings, keep bed in low position, maintain call bell within reach at all times, provide assistance with transfer out of bed and ambulation.  Medication Inspection Compliance: Pill count conducted under aseptic conditions, in front of the patient. Neither the pills nor the bottle was removed from the patient's sight at any time. Once count was completed pills were immediately returned to the patient in their original bottle.  Medication: Hydrocodone/APAP Pill/Patch Count: 38 of 90 pills remain Pill/Patch Appearance: Markings consistent with prescribed medication Bottle Appearance: Standard pharmacy container. Clearly labeled. Filled Date: 1 / 4 / 22 Last Medication intake:  Today

## 2020-09-05 NOTE — Patient Instructions (Signed)
Hydrocodone/APAP to last until 11/18/20 has been escribed to your pharmacy.

## 2020-09-06 DIAGNOSIS — M5459 Other low back pain: Secondary | ICD-10-CM | POA: Diagnosis not present

## 2020-09-06 DIAGNOSIS — R262 Difficulty in walking, not elsewhere classified: Secondary | ICD-10-CM | POA: Diagnosis not present

## 2020-09-06 DIAGNOSIS — M6281 Muscle weakness (generalized): Secondary | ICD-10-CM | POA: Diagnosis not present

## 2020-09-06 DIAGNOSIS — R2681 Unsteadiness on feet: Secondary | ICD-10-CM | POA: Diagnosis not present

## 2020-09-07 DIAGNOSIS — R2681 Unsteadiness on feet: Secondary | ICD-10-CM | POA: Diagnosis not present

## 2020-09-07 DIAGNOSIS — M6281 Muscle weakness (generalized): Secondary | ICD-10-CM | POA: Diagnosis not present

## 2020-09-07 DIAGNOSIS — R262 Difficulty in walking, not elsewhere classified: Secondary | ICD-10-CM | POA: Diagnosis not present

## 2020-09-07 DIAGNOSIS — M5459 Other low back pain: Secondary | ICD-10-CM | POA: Diagnosis not present

## 2020-09-08 DIAGNOSIS — M5459 Other low back pain: Secondary | ICD-10-CM | POA: Diagnosis not present

## 2020-09-08 DIAGNOSIS — M6281 Muscle weakness (generalized): Secondary | ICD-10-CM | POA: Diagnosis not present

## 2020-09-08 DIAGNOSIS — R262 Difficulty in walking, not elsewhere classified: Secondary | ICD-10-CM | POA: Diagnosis not present

## 2020-09-08 DIAGNOSIS — R2681 Unsteadiness on feet: Secondary | ICD-10-CM | POA: Diagnosis not present

## 2020-09-09 ENCOUNTER — Other Ambulatory Visit: Payer: Self-pay | Admitting: Internal Medicine

## 2020-09-09 DIAGNOSIS — G4700 Insomnia, unspecified: Secondary | ICD-10-CM

## 2020-09-10 DIAGNOSIS — R262 Difficulty in walking, not elsewhere classified: Secondary | ICD-10-CM | POA: Diagnosis not present

## 2020-09-10 DIAGNOSIS — M6281 Muscle weakness (generalized): Secondary | ICD-10-CM | POA: Diagnosis not present

## 2020-09-10 DIAGNOSIS — M5459 Other low back pain: Secondary | ICD-10-CM | POA: Diagnosis not present

## 2020-09-10 DIAGNOSIS — R2681 Unsteadiness on feet: Secondary | ICD-10-CM | POA: Diagnosis not present

## 2020-09-11 DIAGNOSIS — M6281 Muscle weakness (generalized): Secondary | ICD-10-CM | POA: Diagnosis not present

## 2020-09-11 DIAGNOSIS — F5109 Other insomnia not due to a substance or known physiological condition: Secondary | ICD-10-CM | POA: Diagnosis not present

## 2020-09-11 DIAGNOSIS — G8929 Other chronic pain: Secondary | ICD-10-CM | POA: Diagnosis not present

## 2020-09-11 DIAGNOSIS — R2681 Unsteadiness on feet: Secondary | ICD-10-CM | POA: Diagnosis not present

## 2020-09-11 DIAGNOSIS — M5459 Other low back pain: Secondary | ICD-10-CM | POA: Diagnosis not present

## 2020-09-11 DIAGNOSIS — I129 Hypertensive chronic kidney disease with stage 1 through stage 4 chronic kidney disease, or unspecified chronic kidney disease: Secondary | ICD-10-CM | POA: Diagnosis not present

## 2020-09-11 DIAGNOSIS — N183 Chronic kidney disease, stage 3 unspecified: Secondary | ICD-10-CM | POA: Diagnosis not present

## 2020-09-11 DIAGNOSIS — R262 Difficulty in walking, not elsewhere classified: Secondary | ICD-10-CM | POA: Diagnosis not present

## 2020-09-11 DIAGNOSIS — M549 Dorsalgia, unspecified: Secondary | ICD-10-CM | POA: Diagnosis not present

## 2020-09-11 DIAGNOSIS — I2510 Atherosclerotic heart disease of native coronary artery without angina pectoris: Secondary | ICD-10-CM | POA: Diagnosis not present

## 2020-09-12 DIAGNOSIS — M5459 Other low back pain: Secondary | ICD-10-CM | POA: Diagnosis not present

## 2020-09-12 DIAGNOSIS — M6281 Muscle weakness (generalized): Secondary | ICD-10-CM | POA: Diagnosis not present

## 2020-09-12 DIAGNOSIS — R2681 Unsteadiness on feet: Secondary | ICD-10-CM | POA: Diagnosis not present

## 2020-09-12 DIAGNOSIS — R262 Difficulty in walking, not elsewhere classified: Secondary | ICD-10-CM | POA: Diagnosis not present

## 2020-09-13 ENCOUNTER — Other Ambulatory Visit: Payer: Self-pay

## 2020-09-13 ENCOUNTER — Encounter
Admission: RE | Admit: 2020-09-13 | Discharge: 2020-09-13 | Disposition: A | Discharge status: Home / Self Care | Payer: Medicare HMO | Source: Ambulatory Visit | Attending: Internal Medicine | Admitting: Internal Medicine

## 2020-09-13 DIAGNOSIS — I25118 Atherosclerotic heart disease of native coronary artery with other forms of angina pectoris: Secondary | ICD-10-CM

## 2020-09-13 DIAGNOSIS — R0602 Shortness of breath: Secondary | ICD-10-CM | POA: Insufficient documentation

## 2020-09-13 LAB — NM MYOCAR MULTI W/SPECT W/WALL MOTION / EF
Estimated workload: 1 METS
Exercise duration (min): 0 min
Exercise duration (sec): 0 s
LV dias vol: 74 mL (ref 46–106)
LV sys vol: 32 mL
MPHR: 145 {beats}/min
Peak HR: 101 {beats}/min
Percent HR: 69 %
Rest HR: 74 {beats}/min
SDS: 1
SRS: 2
SSS: 3
TID: 0.89

## 2020-09-13 MED ORDER — TECHNETIUM TC 99M TETROFOSMIN IV KIT
31.8100 | PACK | Freq: Once | INTRAVENOUS | Status: AC | PRN
  Administered 2020-09-13: 31.81 via INTRAVENOUS

## 2020-09-13 MED ORDER — REGADENOSON 0.4 MG/5ML IV SOLN
0.4000 mg | Freq: Once | INTRAVENOUS | Status: AC
  Administered 2020-09-13: 0.4 mg via INTRAVENOUS
  Filled 2020-09-13: qty 5

## 2020-09-13 MED ORDER — TECHNETIUM TC 99M TETROFOSMIN IV KIT
11.0960 | PACK | Freq: Once | INTRAVENOUS | Status: AC | PRN
  Administered 2020-09-13: 11.096 via INTRAVENOUS

## 2020-09-14 ENCOUNTER — Telehealth: Payer: Self-pay

## 2020-09-14 DIAGNOSIS — M6281 Muscle weakness (generalized): Secondary | ICD-10-CM | POA: Diagnosis not present

## 2020-09-14 DIAGNOSIS — R262 Difficulty in walking, not elsewhere classified: Secondary | ICD-10-CM | POA: Diagnosis not present

## 2020-09-14 DIAGNOSIS — R2681 Unsteadiness on feet: Secondary | ICD-10-CM | POA: Diagnosis not present

## 2020-09-14 DIAGNOSIS — M5459 Other low back pain: Secondary | ICD-10-CM | POA: Diagnosis not present

## 2020-09-14 NOTE — Telephone Encounter (Signed)
Able to reach pt regarding her recent stress test, Dr. Okey Dupre had a chance to review her results and advised" "her stress test does not show evidence of a significant blockage. I have reviewed her study and agree that LVEF appears normal with QGS calculation likely being artifactually low. As long as her symptoms are stable, I do not recommend any further cardiac testing or intervention at this time. She can proceed with back surgery as planned.  Pt is very excited with the good report and thanks Dr. Okey Dupre.  Pt has no questions or concerns at this time, will call back for anything further.

## 2020-09-19 DIAGNOSIS — M5459 Other low back pain: Secondary | ICD-10-CM | POA: Diagnosis not present

## 2020-09-19 DIAGNOSIS — M6281 Muscle weakness (generalized): Secondary | ICD-10-CM | POA: Diagnosis not present

## 2020-09-19 DIAGNOSIS — R262 Difficulty in walking, not elsewhere classified: Secondary | ICD-10-CM | POA: Diagnosis not present

## 2020-09-19 DIAGNOSIS — R2681 Unsteadiness on feet: Secondary | ICD-10-CM | POA: Diagnosis not present

## 2020-09-20 DIAGNOSIS — M6281 Muscle weakness (generalized): Secondary | ICD-10-CM | POA: Diagnosis not present

## 2020-09-20 DIAGNOSIS — M5459 Other low back pain: Secondary | ICD-10-CM | POA: Diagnosis not present

## 2020-09-20 DIAGNOSIS — R262 Difficulty in walking, not elsewhere classified: Secondary | ICD-10-CM | POA: Diagnosis not present

## 2020-09-20 DIAGNOSIS — R2681 Unsteadiness on feet: Secondary | ICD-10-CM | POA: Diagnosis not present

## 2020-09-21 DIAGNOSIS — R2681 Unsteadiness on feet: Secondary | ICD-10-CM | POA: Diagnosis not present

## 2020-09-21 DIAGNOSIS — M810 Age-related osteoporosis without current pathological fracture: Secondary | ICD-10-CM | POA: Diagnosis not present

## 2020-09-21 DIAGNOSIS — M6281 Muscle weakness (generalized): Secondary | ICD-10-CM | POA: Diagnosis not present

## 2020-09-21 DIAGNOSIS — R262 Difficulty in walking, not elsewhere classified: Secondary | ICD-10-CM | POA: Diagnosis not present

## 2020-09-21 DIAGNOSIS — M5459 Other low back pain: Secondary | ICD-10-CM | POA: Diagnosis not present

## 2020-09-22 DIAGNOSIS — R262 Difficulty in walking, not elsewhere classified: Secondary | ICD-10-CM | POA: Diagnosis not present

## 2020-09-22 DIAGNOSIS — M6281 Muscle weakness (generalized): Secondary | ICD-10-CM | POA: Diagnosis not present

## 2020-09-22 DIAGNOSIS — R2681 Unsteadiness on feet: Secondary | ICD-10-CM | POA: Diagnosis not present

## 2020-09-22 DIAGNOSIS — M5459 Other low back pain: Secondary | ICD-10-CM | POA: Diagnosis not present

## 2020-09-25 DIAGNOSIS — M6281 Muscle weakness (generalized): Secondary | ICD-10-CM | POA: Diagnosis not present

## 2020-09-25 DIAGNOSIS — R262 Difficulty in walking, not elsewhere classified: Secondary | ICD-10-CM | POA: Diagnosis not present

## 2020-09-25 DIAGNOSIS — M5459 Other low back pain: Secondary | ICD-10-CM | POA: Diagnosis not present

## 2020-09-25 DIAGNOSIS — R2681 Unsteadiness on feet: Secondary | ICD-10-CM | POA: Diagnosis not present

## 2020-09-26 ENCOUNTER — Other Ambulatory Visit: Payer: Self-pay | Admitting: Internal Medicine

## 2020-09-26 DIAGNOSIS — M6281 Muscle weakness (generalized): Secondary | ICD-10-CM | POA: Diagnosis not present

## 2020-09-26 DIAGNOSIS — M5459 Other low back pain: Secondary | ICD-10-CM | POA: Diagnosis not present

## 2020-09-26 DIAGNOSIS — R2681 Unsteadiness on feet: Secondary | ICD-10-CM | POA: Diagnosis not present

## 2020-09-26 DIAGNOSIS — I10 Essential (primary) hypertension: Secondary | ICD-10-CM

## 2020-09-26 DIAGNOSIS — R262 Difficulty in walking, not elsewhere classified: Secondary | ICD-10-CM | POA: Diagnosis not present

## 2020-09-27 DIAGNOSIS — R2681 Unsteadiness on feet: Secondary | ICD-10-CM | POA: Diagnosis not present

## 2020-09-27 DIAGNOSIS — R262 Difficulty in walking, not elsewhere classified: Secondary | ICD-10-CM | POA: Diagnosis not present

## 2020-09-27 DIAGNOSIS — M6281 Muscle weakness (generalized): Secondary | ICD-10-CM | POA: Diagnosis not present

## 2020-09-27 DIAGNOSIS — M5459 Other low back pain: Secondary | ICD-10-CM | POA: Diagnosis not present

## 2020-09-29 DIAGNOSIS — M5459 Other low back pain: Secondary | ICD-10-CM | POA: Diagnosis not present

## 2020-09-29 DIAGNOSIS — R262 Difficulty in walking, not elsewhere classified: Secondary | ICD-10-CM | POA: Diagnosis not present

## 2020-09-29 DIAGNOSIS — M6281 Muscle weakness (generalized): Secondary | ICD-10-CM | POA: Diagnosis not present

## 2020-09-29 DIAGNOSIS — R2681 Unsteadiness on feet: Secondary | ICD-10-CM | POA: Diagnosis not present

## 2020-10-02 DIAGNOSIS — M5459 Other low back pain: Secondary | ICD-10-CM | POA: Diagnosis not present

## 2020-10-02 DIAGNOSIS — R262 Difficulty in walking, not elsewhere classified: Secondary | ICD-10-CM | POA: Diagnosis not present

## 2020-10-02 DIAGNOSIS — R2681 Unsteadiness on feet: Secondary | ICD-10-CM | POA: Diagnosis not present

## 2020-10-02 DIAGNOSIS — M6281 Muscle weakness (generalized): Secondary | ICD-10-CM | POA: Diagnosis not present

## 2020-10-03 DIAGNOSIS — M5459 Other low back pain: Secondary | ICD-10-CM | POA: Diagnosis not present

## 2020-10-03 DIAGNOSIS — R2681 Unsteadiness on feet: Secondary | ICD-10-CM | POA: Diagnosis not present

## 2020-10-03 DIAGNOSIS — M6281 Muscle weakness (generalized): Secondary | ICD-10-CM | POA: Diagnosis not present

## 2020-10-03 DIAGNOSIS — R262 Difficulty in walking, not elsewhere classified: Secondary | ICD-10-CM | POA: Diagnosis not present

## 2020-10-04 DIAGNOSIS — R262 Difficulty in walking, not elsewhere classified: Secondary | ICD-10-CM | POA: Diagnosis not present

## 2020-10-04 DIAGNOSIS — M6281 Muscle weakness (generalized): Secondary | ICD-10-CM | POA: Diagnosis not present

## 2020-10-04 DIAGNOSIS — R2681 Unsteadiness on feet: Secondary | ICD-10-CM | POA: Diagnosis not present

## 2020-10-04 DIAGNOSIS — M5459 Other low back pain: Secondary | ICD-10-CM | POA: Diagnosis not present

## 2020-10-05 DIAGNOSIS — M5459 Other low back pain: Secondary | ICD-10-CM | POA: Diagnosis not present

## 2020-10-05 DIAGNOSIS — R2681 Unsteadiness on feet: Secondary | ICD-10-CM | POA: Diagnosis not present

## 2020-10-05 DIAGNOSIS — R262 Difficulty in walking, not elsewhere classified: Secondary | ICD-10-CM | POA: Diagnosis not present

## 2020-10-05 DIAGNOSIS — M6281 Muscle weakness (generalized): Secondary | ICD-10-CM | POA: Diagnosis not present

## 2020-10-06 DIAGNOSIS — M6281 Muscle weakness (generalized): Secondary | ICD-10-CM | POA: Diagnosis not present

## 2020-10-06 DIAGNOSIS — R2681 Unsteadiness on feet: Secondary | ICD-10-CM | POA: Diagnosis not present

## 2020-10-06 DIAGNOSIS — R262 Difficulty in walking, not elsewhere classified: Secondary | ICD-10-CM | POA: Diagnosis not present

## 2020-10-06 DIAGNOSIS — M5459 Other low back pain: Secondary | ICD-10-CM | POA: Diagnosis not present

## 2020-10-08 DIAGNOSIS — M6281 Muscle weakness (generalized): Secondary | ICD-10-CM | POA: Diagnosis not present

## 2020-10-08 DIAGNOSIS — R262 Difficulty in walking, not elsewhere classified: Secondary | ICD-10-CM | POA: Diagnosis not present

## 2020-10-08 DIAGNOSIS — M5459 Other low back pain: Secondary | ICD-10-CM | POA: Diagnosis not present

## 2020-10-08 DIAGNOSIS — R2681 Unsteadiness on feet: Secondary | ICD-10-CM | POA: Diagnosis not present

## 2020-10-09 ENCOUNTER — Telehealth: Payer: Self-pay | Admitting: Student in an Organized Health Care Education/Training Program

## 2020-10-09 DIAGNOSIS — M6281 Muscle weakness (generalized): Secondary | ICD-10-CM | POA: Diagnosis not present

## 2020-10-09 DIAGNOSIS — R2681 Unsteadiness on feet: Secondary | ICD-10-CM | POA: Diagnosis not present

## 2020-10-09 DIAGNOSIS — M5459 Other low back pain: Secondary | ICD-10-CM | POA: Diagnosis not present

## 2020-10-09 DIAGNOSIS — R262 Difficulty in walking, not elsewhere classified: Secondary | ICD-10-CM | POA: Diagnosis not present

## 2020-10-09 NOTE — Telephone Encounter (Signed)
Lvmail having a lot of pain hurting very bad.

## 2020-10-10 ENCOUNTER — Ambulatory Visit: Payer: Medicare HMO | Admitting: Urology

## 2020-10-10 ENCOUNTER — Other Ambulatory Visit: Payer: Self-pay

## 2020-10-10 VITALS — BP 133/86 | HR 71 | Ht 63.0 in | Wt 160.0 lb

## 2020-10-10 DIAGNOSIS — M5459 Other low back pain: Secondary | ICD-10-CM | POA: Diagnosis not present

## 2020-10-10 DIAGNOSIS — N3281 Overactive bladder: Secondary | ICD-10-CM | POA: Diagnosis not present

## 2020-10-10 DIAGNOSIS — N390 Urinary tract infection, site not specified: Secondary | ICD-10-CM

## 2020-10-10 DIAGNOSIS — R2681 Unsteadiness on feet: Secondary | ICD-10-CM | POA: Diagnosis not present

## 2020-10-10 DIAGNOSIS — R262 Difficulty in walking, not elsewhere classified: Secondary | ICD-10-CM | POA: Diagnosis not present

## 2020-10-10 DIAGNOSIS — M6281 Muscle weakness (generalized): Secondary | ICD-10-CM | POA: Diagnosis not present

## 2020-10-10 LAB — BLADDER SCAN AMB NON-IMAGING: Scan Result: 29

## 2020-10-10 NOTE — Telephone Encounter (Signed)
Scheduled patient to come in for appt with Dr. Cherylann Ratel

## 2020-10-10 NOTE — Progress Notes (Signed)
10/10/2020 11:20 AM   Kimberly Cook [birth year] [id number]  Referring provider: McLean-Scocuzza, Nino Glow, MD [place],   [id number]  Chief Complaint  Patient presents with  . Recurrent UTI    HPI: 75 year old female who returns today for follow-up of OAB as well as recurrent UTIs.  She has a personal history of multidrug-resistant E. coli UTI.  She was treated twice over the past 12 months, once by Debroah Loop in 11/2019 and again by primary care in late 2021.  She was treated with fosfomycin her symptoms resolved.  She denies any UTI symptoms today including no dysuria.  UA today was not checked given lack of symptoms.  She takes cranberry tablets.  She is not able to tolerate topical estrogen cream due to vaginal burning.  She also has a personal history of OAB, urinary urgency and occasional urge incontinence.  She is on Myrbetriq 50 mg currently which has improved her symptoms.  She does report that occasionally she has difficulty starting her stream but ultimately stable to go.  Results for orders placed or performed in visit on 10/10/20  BLADDER SCAN AMB NON-IMAGING  Result Value Ref Range   Scan Result 29 ml       PMH: Past Medical History:  Diagnosis Date  . Allergy   . Anxiety   . Arthritis   . Back pain   . Chronic kidney disease    STAGE 3  . Coronary artery disease 04/2017   Mild to moderate CAD in LAD/diagonal by CTA (CT-FFR of apical LAD 0.79).  . DDD (degenerative disc disease), lumbar   . Depression   . Diverticulosis   . Essential hypertension    Normal cardiolite 05/2006 EF 71%  . GERD (gastroesophageal reflux disease)   . Headache   . History of shingles   . Hyperlipidemia   . Hypothyroidism   . Lung nodule   . Mini stroke ([place]) 2011   . Occipital neuralgia   . Palpitations   . Pneumonia 2018  . Prediabetes   . Stroke ([place])   . Stroke [facility])    MRI 04/2008 + left sup. frontal gyrus possibly puntate  infarct   . Syncope 2019  . Urinary tract infection   . Vitamin D deficiency     Surgical History: Past Surgical History:  Procedure Laterality Date  . ABDOMINAL HYSTERECTOMY    . BLADDER SURGERY     2003  . BREAST EXCISIONAL BIOPSY Right Over 20 years    Benign  . CHOLECYSTECTOMY    . gastroplication     . JOINT REPLACEMENT Left    KNEE  . KNEE ARTHROSCOPY Left 2011  . PULSE GENERATOR IMPLANT Left 01/31/2020   Procedure: PLACEMENT RIGHT FLANK PULSE GENERATOR VS REMOVAL SPINAL CORD STIMULATOR;  Surgeon: Deetta Perla, MD;  Location: [facility] ORS;  Service: Neurosurgery;  Laterality: Left;  . PULSE GENERATOR IMPLANT Left 04/24/2020   Procedure: REPLACEMENT LEFT FLANK PULSE GENERATOR IMPLANT;  Surgeon: Deetta Perla, MD;  Location: [facility] ORS;  Service: Neurosurgery;  Laterality: Left;  MAC w/ local  . right arm fracture    . SPINAL CORD STIMULATOR REMOVAL N/A 06/26/2020   Procedure: SPINAL CORD STIMULATOR REMOVAL;  Surgeon: Deetta Perla, MD;  Location: [facility] ORS;  Service: Neurosurgery;  Laterality: N/A;  . THORACIC LAMINECTOMY FOR SPINAL CORD STIMULATOR N/A 01/24/2020   Procedure: THORACIC SPINAL CORD STIMULATOR PADDLE TRIAL VIA LAMINECTOMY;  Surgeon: Deetta Perla, MD;  Location: [facility] ORS;  Service: Neurosurgery;  Laterality:  N/A;  . TONSILLECTOMY AND ADENOIDECTOMY    . TOTAL KNEE ARTHROPLASTY Left 04/17/2015   Procedure: LEFT TOTAL KNEE ARTHROPLASTY;  Surgeon: Paralee Cancel, MD;  Location: [facility];  Service: Orthopedics;  Laterality: Left;    Home Medications:  Allergies as of 10/10/2020   No Known Allergies     Medication List       Accurate as of October 10, 2020 11:20 AM. If you have any questions, ask your nurse or doctor.        acetaminophen 500 MG tablet Commonly known as: TYLENOL Take 1,000 mg by mouth every 8 (eight) hours as needed for moderate pain.   alendronate 70 MG tablet Commonly known as: FOSAMAX Take 70 mg by mouth once a week.   aspirin EC 81 MG tablet Take 81 mg  by mouth at bedtime. Swallow whole.   atorvastatin 20 MG tablet Commonly known as: LIPITOR Take 1 tablet (20 mg total) by mouth daily.   Biotin 1 MG Caps Take 1 mg by mouth daily.   CALCIUM PO Take 1 tablet by mouth daily.   cetirizine 10 MG tablet Commonly known as: ZYRTEC Take 10 mg by mouth every morning.   Cranberry 500 MG Caps Take 500 mg by mouth daily.   diltiazem 60 MG 12 hr capsule Commonly known as: CARDIZEM SR Take 1 capsule (60 mg total) by mouth 2 (two) times daily.   escitalopram 5 MG tablet Commonly known as: LEXAPRO Take 1 tablet (5 mg total) by mouth every morning.   folic acid 856 MCG tablet Commonly known as: FOLVITE Take 1 tablet (400 mcg total) by mouth daily. SEPARATE ALL SUPPLEMENTS TO LUNCH OR DINNER AND PRILOSEC NOT TO MESS W/THYROID MED   gabapentin 300 MG capsule Commonly known as: NEURONTIN 1 pill in the am and lunch and 2 pills qhs What changed:   how much to take  how to take this  when to take this  additional instructions   HEALTHY COLON PO Take 1 capsule by mouth daily.   hydrALAZINE 10 MG tablet Commonly known as: APRESOLINE Take 2 tablets (20 mg total) by mouth 2 (two) times daily as needed. BP>140/>90   hydrochlorothiazide 12.5 MG capsule Commonly known as: MICROZIDE TAKE 1 CAPSULE EVERY MORNING   HYDROcodone-acetaminophen 5-325 MG tablet Commonly known as: NORCO/VICODIN Take 1 tablet by mouth every 8 (eight) hours as needed for severe pain. Must last 30 days.   HYDROcodone-acetaminophen 5-325 MG tablet Commonly known as: NORCO/VICODIN Take 1 tablet by mouth every 8 (eight) hours as needed for severe pain. Must last 30 days. Start taking on: October 19, 2020   levothyroxine 88 MCG tablet Commonly known as: SYNTHROID Take 1 tablet (88 mcg total) by mouth daily before breakfast. Skip sundays   lidocaine 5 % Commonly known as: LIDODERM Place 1-2 patches onto the skin daily as needed (pain). Remove & Discard patch  within 12 hours or as directed by MD   LORazepam 0.5 MG tablet Commonly known as: Ativan Take 1 tablet (0.5 mg total) by mouth 2 (two) times daily as needed for anxiety or sleep. Or 1 mg at night for sleep   mirabegron ER 50 MG Tb24 tablet Commonly known as: MYRBETRIQ Take 1 tablet (50 mg total) by mouth daily. What changed: when to take this   montelukast 10 MG tablet Commonly known as: SINGULAIR Take 1 tablet (10 mg total) by mouth at bedtime.   naloxegol oxalate 12.5 MG Tabs tablet Commonly known as: MOVANTIK Take  1-2 tablets (12.5-25 mg total) by mouth daily. Constipation x 3 days prn   omeprazole 40 MG capsule Commonly known as: PRILOSEC Take 1 capsule (40 mg total) by mouth daily. After lunch   polyethylene glycol powder 17 GM/SCOOP powder Commonly known as: GLYCOLAX/MIRALAX Take 17 g by mouth daily as needed for moderate constipation.   potassium chloride 10 MEQ tablet Commonly known as: KLOR-CON Take 1 tablet (10 mEq total) by mouth daily.   Replens Gel Place 1 application vaginally every 3 (three) days.   sodium chloride 0.65 % Soln nasal spray Commonly known as: OCEAN Place 2 sprays into both nostrils as needed for congestion. Use 1st   tiZANidine 4 MG tablet Commonly known as: ZANAFLEX Take 1 tablet (4 mg total) by mouth 2 (two) times daily as needed for muscle spasms.   traZODone 50 MG tablet Commonly known as: DESYREL TAKE 2 TABLETS EVERY DAY 1 HOUR BEFORE BED   Turmeric Curcumin 500 MG Caps Take 500 mg by mouth daily.   vitamin B-12 1000 MCG tablet Commonly known as: CYANOCOBALAMIN Take 1 tablet (1,000 mcg total) by mouth daily.   Vitamin D3 125 MCG (5000 UT) Tabs Take 1 tablet (5,000 Units total) by mouth daily.       Allergies: No Known Allergies  Family History: Family History  Problem Relation Age of Onset  . Heart disease Mother   . Hypertension Mother   . Diabetes Mother   . Heart attack Mother 57  . Heart disease Father   .  Heart attack Father 58  . Breast cancer Maternal Aunt   . Heart attack Brother     Social History:  reports that she is a non-smoker but has been exposed to tobacco smoke. She has never used smokeless tobacco. She reports that she does not drink alcohol and does not use drugs.   Physical Exam: BP 133/86   Pulse 71   Ht _0  (1.6 m)   Wt 160 lb (72.6 kg)   BMI 28.34 kg/m   Constitutional:  Alert and oriented, No acute distress. HEENT: [place] AT, moist mucus membranes.  Trachea midline, no masses. Cardiovascular: No clubbing, cyanosis, or edema. Respiratory: Normal respiratory effort, no increased work of breathing. Skin: No rashes, bruises or suspicious lesions. Neurologic: Grossly intact, no focal deficits, moving all 4 extremities. Psychiatric: Normal mood and affect.  Assessment & Plan:    1. Recurrent UTI 2 isolated episodes of MDR E. coli in the past 12 months, currently asymptomatic  We discussed antibiotic stewardship and treating very judiciously only when she has symptoms.  Asked her if she thinks that she has signs or symptoms of infection, make a same day or next day appointment with [place] will be happy to manage this.  Continue cranberry tablets/probiotics.  Unable to tolerate topical estrogen cream - BLADDER SCAN AMB NON-IMAGING  2. OAB (overactive bladder) Emptying adequately on Myrbetriq 50 mg  Continue this medication, seems to be helping with her OAB symptoms.  Timed and double void if she feels like she is not able to empty.   F/u 1 year  Hollice Espy, MD  [facility] [address], [place], [place] [address] [phone]

## 2020-10-12 ENCOUNTER — Other Ambulatory Visit: Payer: Self-pay | Admitting: Neurosurgery

## 2020-10-12 ENCOUNTER — Ambulatory Visit
Admission: RE | Admit: 2020-10-12 | Discharge: 2020-10-12 | Disposition: A | Discharge status: Home / Self Care | Payer: Medicare HMO | Source: Ambulatory Visit | Attending: Neurosurgery | Admitting: Neurosurgery

## 2020-10-12 ENCOUNTER — Ambulatory Visit
Admission: RE | Admit: 2020-10-12 | Discharge: 2020-10-12 | Disposition: A | Discharge status: Home / Self Care | Payer: Medicare HMO | Attending: Neurosurgery | Admitting: Neurosurgery

## 2020-10-12 DIAGNOSIS — M419 Scoliosis, unspecified: Secondary | ICD-10-CM

## 2020-10-12 DIAGNOSIS — M6281 Muscle weakness (generalized): Secondary | ICD-10-CM | POA: Diagnosis not present

## 2020-10-12 DIAGNOSIS — M4014 Other secondary kyphosis, thoracic region: Secondary | ICD-10-CM | POA: Diagnosis not present

## 2020-10-12 DIAGNOSIS — M48062 Spinal stenosis, lumbar region with neurogenic claudication: Secondary | ICD-10-CM | POA: Diagnosis not present

## 2020-10-12 DIAGNOSIS — M438X9 Other specified deforming dorsopathies, site unspecified: Secondary | ICD-10-CM | POA: Diagnosis not present

## 2020-10-12 DIAGNOSIS — M5459 Other low back pain: Secondary | ICD-10-CM | POA: Diagnosis not present

## 2020-10-12 DIAGNOSIS — R262 Difficulty in walking, not elsewhere classified: Secondary | ICD-10-CM | POA: Diagnosis not present

## 2020-10-12 DIAGNOSIS — M4185 Other forms of scoliosis, thoracolumbar region: Secondary | ICD-10-CM | POA: Diagnosis not present

## 2020-10-12 DIAGNOSIS — R2681 Unsteadiness on feet: Secondary | ICD-10-CM | POA: Diagnosis not present

## 2020-10-13 ENCOUNTER — Other Ambulatory Visit: Payer: Self-pay | Admitting: Neurosurgery

## 2020-10-13 ENCOUNTER — Other Ambulatory Visit (HOSPITAL_COMMUNITY): Payer: Self-pay | Admitting: Neurosurgery

## 2020-10-13 DIAGNOSIS — M48062 Spinal stenosis, lumbar region with neurogenic claudication: Secondary | ICD-10-CM

## 2020-10-13 DIAGNOSIS — R262 Difficulty in walking, not elsewhere classified: Secondary | ICD-10-CM | POA: Diagnosis not present

## 2020-10-13 DIAGNOSIS — M419 Scoliosis, unspecified: Secondary | ICD-10-CM

## 2020-10-13 DIAGNOSIS — M6281 Muscle weakness (generalized): Secondary | ICD-10-CM | POA: Diagnosis not present

## 2020-10-13 DIAGNOSIS — M5459 Other low back pain: Secondary | ICD-10-CM | POA: Diagnosis not present

## 2020-10-13 DIAGNOSIS — R2681 Unsteadiness on feet: Secondary | ICD-10-CM | POA: Diagnosis not present

## 2020-10-16 DIAGNOSIS — R2681 Unsteadiness on feet: Secondary | ICD-10-CM | POA: Diagnosis not present

## 2020-10-16 DIAGNOSIS — R262 Difficulty in walking, not elsewhere classified: Secondary | ICD-10-CM | POA: Diagnosis not present

## 2020-10-16 DIAGNOSIS — M5459 Other low back pain: Secondary | ICD-10-CM | POA: Diagnosis not present

## 2020-10-16 DIAGNOSIS — M6281 Muscle weakness (generalized): Secondary | ICD-10-CM | POA: Diagnosis not present

## 2020-10-17 DIAGNOSIS — R262 Difficulty in walking, not elsewhere classified: Secondary | ICD-10-CM | POA: Diagnosis not present

## 2020-10-17 DIAGNOSIS — R2681 Unsteadiness on feet: Secondary | ICD-10-CM | POA: Diagnosis not present

## 2020-10-17 DIAGNOSIS — M6281 Muscle weakness (generalized): Secondary | ICD-10-CM | POA: Diagnosis not present

## 2020-10-17 DIAGNOSIS — M5459 Other low back pain: Secondary | ICD-10-CM | POA: Diagnosis not present

## 2020-10-18 DIAGNOSIS — M5459 Other low back pain: Secondary | ICD-10-CM | POA: Diagnosis not present

## 2020-10-18 DIAGNOSIS — M6281 Muscle weakness (generalized): Secondary | ICD-10-CM | POA: Diagnosis not present

## 2020-10-18 DIAGNOSIS — R2681 Unsteadiness on feet: Secondary | ICD-10-CM | POA: Diagnosis not present

## 2020-10-18 DIAGNOSIS — R262 Difficulty in walking, not elsewhere classified: Secondary | ICD-10-CM | POA: Diagnosis not present

## 2020-10-19 DIAGNOSIS — M5459 Other low back pain: Secondary | ICD-10-CM | POA: Diagnosis not present

## 2020-10-19 DIAGNOSIS — M810 Age-related osteoporosis without current pathological fracture: Secondary | ICD-10-CM | POA: Diagnosis not present

## 2020-10-19 DIAGNOSIS — R2681 Unsteadiness on feet: Secondary | ICD-10-CM | POA: Diagnosis not present

## 2020-10-20 ENCOUNTER — Other Ambulatory Visit: Payer: Self-pay | Admitting: Physician Assistant

## 2020-10-20 DIAGNOSIS — R3915 Urgency of urination: Secondary | ICD-10-CM

## 2020-10-20 DIAGNOSIS — R2681 Unsteadiness on feet: Secondary | ICD-10-CM | POA: Diagnosis not present

## 2020-10-20 DIAGNOSIS — M6281 Muscle weakness (generalized): Secondary | ICD-10-CM | POA: Diagnosis not present

## 2020-10-20 DIAGNOSIS — R262 Difficulty in walking, not elsewhere classified: Secondary | ICD-10-CM | POA: Diagnosis not present

## 2020-10-20 DIAGNOSIS — M5459 Other low back pain: Secondary | ICD-10-CM | POA: Diagnosis not present

## 2020-10-23 DIAGNOSIS — M5459 Other low back pain: Secondary | ICD-10-CM | POA: Diagnosis not present

## 2020-10-23 DIAGNOSIS — G8929 Other chronic pain: Secondary | ICD-10-CM | POA: Diagnosis not present

## 2020-10-23 DIAGNOSIS — R2681 Unsteadiness on feet: Secondary | ICD-10-CM | POA: Diagnosis not present

## 2020-10-23 DIAGNOSIS — M6281 Muscle weakness (generalized): Secondary | ICD-10-CM | POA: Diagnosis not present

## 2020-10-23 DIAGNOSIS — M549 Dorsalgia, unspecified: Secondary | ICD-10-CM | POA: Diagnosis not present

## 2020-10-23 DIAGNOSIS — I2510 Atherosclerotic heart disease of native coronary artery without angina pectoris: Secondary | ICD-10-CM | POA: Diagnosis not present

## 2020-10-23 DIAGNOSIS — R5381 Other malaise: Secondary | ICD-10-CM | POA: Diagnosis not present

## 2020-10-23 DIAGNOSIS — R262 Difficulty in walking, not elsewhere classified: Secondary | ICD-10-CM | POA: Diagnosis not present

## 2020-10-23 DIAGNOSIS — F419 Anxiety disorder, unspecified: Secondary | ICD-10-CM | POA: Diagnosis not present

## 2020-10-24 DIAGNOSIS — R262 Difficulty in walking, not elsewhere classified: Secondary | ICD-10-CM | POA: Diagnosis not present

## 2020-10-24 DIAGNOSIS — M5459 Other low back pain: Secondary | ICD-10-CM | POA: Diagnosis not present

## 2020-10-24 DIAGNOSIS — M6281 Muscle weakness (generalized): Secondary | ICD-10-CM | POA: Diagnosis not present

## 2020-10-24 DIAGNOSIS — R2681 Unsteadiness on feet: Secondary | ICD-10-CM | POA: Diagnosis not present

## 2020-10-25 ENCOUNTER — Ambulatory Visit
Admission: RE | Admit: 2020-10-25 | Discharge: 2020-10-25 | Disposition: A | Discharge status: Home / Self Care | Payer: Medicare HMO | Source: Ambulatory Visit | Attending: Neurosurgery | Admitting: Neurosurgery

## 2020-10-25 ENCOUNTER — Other Ambulatory Visit: Payer: Self-pay

## 2020-10-25 ENCOUNTER — Other Ambulatory Visit: Payer: Self-pay | Admitting: Internal Medicine

## 2020-10-25 DIAGNOSIS — M419 Scoliosis, unspecified: Secondary | ICD-10-CM | POA: Diagnosis not present

## 2020-10-25 DIAGNOSIS — M48062 Spinal stenosis, lumbar region with neurogenic claudication: Secondary | ICD-10-CM | POA: Diagnosis not present

## 2020-10-25 DIAGNOSIS — R262 Difficulty in walking, not elsewhere classified: Secondary | ICD-10-CM | POA: Diagnosis not present

## 2020-10-25 DIAGNOSIS — M5459 Other low back pain: Secondary | ICD-10-CM | POA: Diagnosis not present

## 2020-10-25 DIAGNOSIS — R2681 Unsteadiness on feet: Secondary | ICD-10-CM | POA: Diagnosis not present

## 2020-10-25 DIAGNOSIS — M48061 Spinal stenosis, lumbar region without neurogenic claudication: Secondary | ICD-10-CM | POA: Diagnosis not present

## 2020-10-25 DIAGNOSIS — M4186 Other forms of scoliosis, lumbar region: Secondary | ICD-10-CM | POA: Diagnosis not present

## 2020-10-25 DIAGNOSIS — M47816 Spondylosis without myelopathy or radiculopathy, lumbar region: Secondary | ICD-10-CM | POA: Diagnosis not present

## 2020-10-25 DIAGNOSIS — M5126 Other intervertebral disc displacement, lumbar region: Secondary | ICD-10-CM | POA: Diagnosis not present

## 2020-10-25 DIAGNOSIS — M6281 Muscle weakness (generalized): Secondary | ICD-10-CM | POA: Diagnosis not present

## 2020-10-25 MED ORDER — ASPIRIN EC 81 MG PO TBEC: 81.0000 mg | DELAYED_RELEASE_TABLET | Freq: Every day | ORAL | 3 refills | Status: DC

## 2020-10-26 DIAGNOSIS — R2681 Unsteadiness on feet: Secondary | ICD-10-CM | POA: Diagnosis not present

## 2020-10-26 DIAGNOSIS — M6281 Muscle weakness (generalized): Secondary | ICD-10-CM | POA: Diagnosis not present

## 2020-10-26 DIAGNOSIS — M5459 Other low back pain: Secondary | ICD-10-CM | POA: Diagnosis not present

## 2020-10-26 DIAGNOSIS — R262 Difficulty in walking, not elsewhere classified: Secondary | ICD-10-CM | POA: Diagnosis not present

## 2020-10-27 DIAGNOSIS — R262 Difficulty in walking, not elsewhere classified: Secondary | ICD-10-CM | POA: Diagnosis not present

## 2020-10-27 DIAGNOSIS — M6281 Muscle weakness (generalized): Secondary | ICD-10-CM | POA: Diagnosis not present

## 2020-10-27 DIAGNOSIS — R2681 Unsteadiness on feet: Secondary | ICD-10-CM | POA: Diagnosis not present

## 2020-10-27 DIAGNOSIS — M5459 Other low back pain: Secondary | ICD-10-CM | POA: Diagnosis not present

## 2020-10-30 DIAGNOSIS — R2681 Unsteadiness on feet: Secondary | ICD-10-CM | POA: Diagnosis not present

## 2020-10-30 DIAGNOSIS — M5459 Other low back pain: Secondary | ICD-10-CM | POA: Diagnosis not present

## 2020-10-30 DIAGNOSIS — R262 Difficulty in walking, not elsewhere classified: Secondary | ICD-10-CM | POA: Diagnosis not present

## 2020-10-30 DIAGNOSIS — M6281 Muscle weakness (generalized): Secondary | ICD-10-CM | POA: Diagnosis not present

## 2020-10-31 ENCOUNTER — Encounter: Payer: Medicare HMO | Admitting: Student in an Organized Health Care Education/Training Program

## 2020-10-31 ENCOUNTER — Ambulatory Visit: Payer: Medicare HMO

## 2020-10-31 DIAGNOSIS — R2681 Unsteadiness on feet: Secondary | ICD-10-CM | POA: Diagnosis not present

## 2020-10-31 DIAGNOSIS — R262 Difficulty in walking, not elsewhere classified: Secondary | ICD-10-CM | POA: Diagnosis not present

## 2020-10-31 DIAGNOSIS — M6281 Muscle weakness (generalized): Secondary | ICD-10-CM | POA: Diagnosis not present

## 2020-10-31 DIAGNOSIS — M5459 Other low back pain: Secondary | ICD-10-CM | POA: Diagnosis not present

## 2020-11-02 ENCOUNTER — Encounter: Payer: Self-pay | Admitting: Student in an Organized Health Care Education/Training Program

## 2020-11-02 ENCOUNTER — Ambulatory Visit
Payer: Medicare HMO | Attending: Student in an Organized Health Care Education/Training Program | Admitting: Student in an Organized Health Care Education/Training Program

## 2020-11-02 ENCOUNTER — Encounter: Payer: Medicare HMO | Admitting: Student in an Organized Health Care Education/Training Program

## 2020-11-02 ENCOUNTER — Other Ambulatory Visit: Payer: Self-pay

## 2020-11-02 VITALS — BP 118/86 | HR 68 | Temp 97.0°F | Resp 18 | Ht 64.0 in | Wt 160.0 lb

## 2020-11-02 DIAGNOSIS — M479 Spondylosis, unspecified: Secondary | ICD-10-CM | POA: Diagnosis not present

## 2020-11-02 DIAGNOSIS — G894 Chronic pain syndrome: Secondary | ICD-10-CM | POA: Insufficient documentation

## 2020-11-02 DIAGNOSIS — T85192D Other mechanical complication of implanted electronic neurostimulator (electrode) of spinal cord, subsequent encounter: Secondary | ICD-10-CM | POA: Diagnosis not present

## 2020-11-02 DIAGNOSIS — R2681 Unsteadiness on feet: Secondary | ICD-10-CM | POA: Diagnosis not present

## 2020-11-02 DIAGNOSIS — M4802 Spinal stenosis, cervical region: Secondary | ICD-10-CM | POA: Insufficient documentation

## 2020-11-02 DIAGNOSIS — R262 Difficulty in walking, not elsewhere classified: Secondary | ICD-10-CM | POA: Diagnosis not present

## 2020-11-02 DIAGNOSIS — M5442 Lumbago with sciatica, left side: Secondary | ICD-10-CM | POA: Insufficient documentation

## 2020-11-02 DIAGNOSIS — G8929 Other chronic pain: Secondary | ICD-10-CM | POA: Insufficient documentation

## 2020-11-02 DIAGNOSIS — M5416 Radiculopathy, lumbar region: Secondary | ICD-10-CM | POA: Diagnosis not present

## 2020-11-02 DIAGNOSIS — M5136 Other intervertebral disc degeneration, lumbar region: Secondary | ICD-10-CM

## 2020-11-02 DIAGNOSIS — M47816 Spondylosis without myelopathy or radiculopathy, lumbar region: Secondary | ICD-10-CM | POA: Diagnosis not present

## 2020-11-02 DIAGNOSIS — M6281 Muscle weakness (generalized): Secondary | ICD-10-CM | POA: Diagnosis not present

## 2020-11-02 DIAGNOSIS — M5459 Other low back pain: Secondary | ICD-10-CM | POA: Diagnosis not present

## 2020-11-02 MED ORDER — ORPHENADRINE CITRATE 30 MG/ML IJ SOLN
INTRAMUSCULAR | Status: AC
  Filled 2020-11-02: qty 2

## 2020-11-02 MED ORDER — KETOROLAC TROMETHAMINE 30 MG/ML IJ SOLN
30.0000 mg | Freq: Once | INTRAMUSCULAR | Status: AC
  Administered 2020-11-02: 30 mg via INTRAMUSCULAR

## 2020-11-02 MED ORDER — HYDROCODONE-ACETAMINOPHEN 7.5-325 MG PO TABS: 1.0000 | ORAL_TABLET | Freq: Four times a day (QID) | ORAL | 0 refills | Status: DC | PRN

## 2020-11-02 MED ORDER — GABAPENTIN 600 MG PO TABS: 600.0000 mg | ORAL_TABLET | Freq: Three times a day (TID) | ORAL | 2 refills | Status: DC

## 2020-11-02 MED ORDER — ORPHENADRINE CITRATE 30 MG/ML IJ SOLN
30.0000 mg | Freq: Once | INTRAMUSCULAR | Status: AC
  Administered 2020-11-02: 30 mg via INTRAMUSCULAR

## 2020-11-02 MED ORDER — KETOROLAC TROMETHAMINE 30 MG/ML IJ SOLN
INTRAMUSCULAR | Status: AC
  Filled 2020-11-02: qty 1

## 2020-11-02 MED ORDER — HYDROCODONE-ACETAMINOPHEN 7.5-325 MG PO TABS: 1.0000 | ORAL_TABLET | Freq: Four times a day (QID) | ORAL | 0 refills | Status: AC | PRN

## 2020-11-02 NOTE — Progress Notes (Signed)
PROVIDER NOTE: Information contained herein reflects review and annotations entered in association with encounter. Interpretation of such information and data should be left to medically-trained personnel. Information provided to patient can be located elsewhere in the medical record under "Patient Instructions". Document created using STT-dictation technology, any transcriptional errors that may result from process are unintentional.    Patient: Kimberly Cook  Service Category: E/M  Provider: Gillis Santa, MD  DOB: 03-08-1946  DOS: 11/02/2020  Specialty: Interventional Pain Management  MRN: [id number]  Setting: Ambulatory outpatient  PCP: Virgie Dad, FNP  Type: Established Patient    Referring Provider: Virgie Dad, FNP  Location: Office  Delivery: Face-to-face     HPI  [name], a 75 y.o. year old female, is here today because of her Failed spinal cord stimulator, subsequent encounter [T85.192D]. [name] primary complain today is Back Pain (Low, left side is worse) Last encounter: My last encounter with her was on 10/31/2020. Pertinent problems: [name] has Chronic pain; Chronic back pain; Lumbar spondylosis; Lumbar herniated disc; Lumbar degenerative disc disease; Lumbar radiculopathy; Lumbar facet arthropathy; and Spinal stenosis, lumbar region, with neurogenic claudication on their pertinent problem list. Pain Assessment: Severity of Chronic pain is reported as a 10-Worst pain ever/10. Location: Back Lower,Right,Left (left is worse)/radiates down left leg to foot. Onset: More than a month ago. Quality: Aching,Heaviness (electrical pains). Timing: Constant. Modifying factor(s): medication. Vitals:  height is 5' 4"  (1.626 m) and weight is 160 lb (72.6 kg). Her temperature is 97 F (36.1 C) (abnormal). Her blood pressure is 118/86 and her pulse is 68. Her respiration is 18 and oxygen saturation is 99%.   Reason for encounter: medication management.    Presents  today for increased low back pain that is radiating into her left leg. States that she has really been struggling with severe pain that is really impacting her quality of life. She has an upcoming appointment with Dr Cari Caraway to review MRI. Is taking Hydrocodone 5 mg TID however states that it is not very helpful.  She notices an increase in her pain by our 3/4.  She is utilizing gabapentin 300 mg in the morning, 300 mg in the evening, 600 mg nightly.  Of note, she is status post failed spinal cord stimulator implant.  Her spinal cord stimulator was removed 06/26/2020.  Pharmacotherapy Assessment   Analgesic: Hydrocodone 5 mg 3 times daily as needed, quantity 90/month, awill increase to 7.5 mg every 6 hours as needed given increased pain    Monitoring: [place] PMP: PDMP reviewed during this encounter.       Pharmacotherapy: No side-effects or adverse reactions reported. Compliance: No problems identified. Effectiveness: Clinically acceptable.  Dewayne Shorter, RN  11/02/2020  9:57 AM  Signed Nursing Pain Medication Assessment:  Safety precautions to be maintained throughout the outpatient stay will include: orient to surroundings, keep bed in low position, maintain call bell within reach at all times, provide assistance with transfer out of bed and ambulation.  Medication Inspection Compliance: Pill count conducted under aseptic conditions, in front of the patient. Neither the pills nor the bottle was removed from the patient's sight at any time. Once count was completed pills were immediately returned to the patient in their original bottle.  Medication: Hydrocodone/APAP Pill/Patch Count: 51 of 90 pills remain Pill/Patch Appearance: Markings consistent with prescribed medication Bottle Appearance: Standard pharmacy container. Clearly labeled. Filled Date: 03 / 17 / 2022 Last Medication intake:  Today    UDS:  Summary  Date Value Ref Range Status  08/08/2020 Note  Final    Comment:     ==================================================================== Compliance Drug Analysis, Ur ==================================================================== Test                             Result       Flag       Units  Drug Present and Declared for Prescription Verification   Lorazepam                      433          EXPECTED   ng/mg creat    Source of lorazepam is a scheduled prescription medication.    Gabapentin                     PRESENT      EXPECTED   Tizanidine                     PRESENT      EXPECTED   Citalopram                     PRESENT      EXPECTED   Desmethylcitalopram            PRESENT      EXPECTED    Desmethylcitalopram is an expected metabolite of citalopram or the    enantiomeric form, escitalopram.    Trazodone                      PRESENT      EXPECTED   1,3 chlorophenyl piperazine    PRESENT      EXPECTED    1,3-chlorophenyl piperazine is an expected metabolite of trazodone.    Acetaminophen                  PRESENT      EXPECTED   Diltiazem                      PRESENT      EXPECTED  Drug Absent but Declared for Prescription Verification   Hydrocodone                    Not Detected UNEXPECTED ng/mg creat   Salicylate                     Not Detected UNEXPECTED    Aspirin, as indicated in the declared medication list, is not always    detected even when used as directed.    Lidocaine                      Not Detected UNEXPECTED    Lidocaine, as indicated in the declared medication list, is not    always detected even when used as directed.  ==================================================================== Test                      Result    Flag   Units      Ref Range   Creatinine              189              mg/dL      >=20 ==================================================================== Declared Medications:  The flagging and interpretation on this report are based on the  following declared medications.  Unexpected results may  arise from  inaccuracies in the declared medications.   **Note: The testing scope of this panel includes these medications:   Diltiazem (Cardizem)  Escitalopram (Lexapro)  Gabapentin (Neurontin)  Hydrocodone (Norco)  Lorazepam (Ativan)  Trazodone (Desyrel)   **Note: The testing scope of this panel does not include small to  moderate amounts of these reported medications:   Acetaminophen (Tylenol)  Acetaminophen (Norco)  Aspirin  Lidocaine  Tizanidine (Zanaflex)   **Note: The testing scope of this panel does not include the  following reported medications:   Alendronate (Fosamax)  Atorvastatin (Lipitor)  Biotin  Cetirizine (Zyrtec)  Cranberry  Cyanocobalamin  Fluticasone (Flonase)  Folic Acid  Hydralazine (Apresoline)  Hydrochlorothiazide (Microzide)  Levothyroxine (Synthroid)  Mirabegron (Myrbetriq)  Montelukast (Singulair)  Naloxegol (Movantik)  Nitrofurantoin (Macrobid)  Omeprazole (Prilosec)  Polyethylene Glycol  Potassium (Klor-Con)  Sodium Chloride  Topical  Turmeric  Vitamin D3 ==================================================================== For clinical consultation, please call [phone]. ====================================================================      ROS  Constitutional: Denies any fever or chills Gastrointestinal: No reported hemesis, hematochezia, vomiting, or acute GI distress Musculoskeletal: Low back, left leg pain Neurological: No reported episodes of acute onset apraxia, aphasia, dysarthria, agnosia, amnesia, paralysis, loss of coordination, or loss of consciousness  Medication Review  Biotin, Calcium, Cranberry, HYDROcodone-acetaminophen, LORazepam, Probiotic Product, Replens, Turmeric Curcumin, Vitamin D3, acetaminophen, alendronate, aspirin EC, atorvastatin, cetirizine, diltiazem, escitalopram, folic acid, gabapentin, hydrALAZINE, hydrochlorothiazide, levothyroxine, lidocaine, mirabegron ER, montelukast, naloxegol  oxalate, omeprazole, polyethylene glycol powder, potassium chloride, sodium chloride, tiZANidine, traZODone, and vitamin B-12  History Review  Allergy: Ms. Moskowitz has No Known Allergies. Drug: Ms. Polzin  reports no history of drug use. Alcohol:  reports no history of alcohol use. Tobacco:  reports that she is a non-smoker but has been exposed to tobacco smoke. She has never used smokeless tobacco. Social: Ms. Noland  reports that she is a non-smoker but has been exposed to tobacco smoke. She has never used smokeless tobacco. She reports that she does not drink alcohol and does not use drugs. Medical:  has a past medical history of Allergy, Anxiety, Arthritis, Back pain, Chronic kidney disease, Coronary artery disease (04/2017), DDD (degenerative disc disease), lumbar, Depression, Diverticulosis, Essential hypertension, GERD (gastroesophageal reflux disease), Headache, History of shingles, Hyperlipidemia, Hypothyroidism, Lung nodule, Mini stroke ([place]) (2011 ), Occipital neuralgia, Palpitations, Pneumonia (2018), Prediabetes, Stroke [facility]), Stroke [facility]), Syncope (2019), Urinary tract infection, and Vitamin D deficiency. Surgical: Ms. Dolce  has a past surgical history that includes Cholecystectomy; Bladder surgery; Tonsillectomy and adenoidectomy; Knee arthroscopy (Left, 2011); Abdominal hysterectomy; gastroplication ; Total knee arthroplasty (Left, 04/17/2015); Breast excisional biopsy (Right, Over 20 years ); Joint replacement (Left); Thoracic laminectomy for spinal cord stimulator (N/A, 01/24/2020); Pulse generator implant (Left, 01/31/2020); right arm fracture; Pulse generator implant (Left, 04/24/2020); and Spinal cord stimulator removal (N/A, 06/26/2020). Family: family history includes Breast cancer in her maternal aunt; Diabetes in her mother; Heart attack in her brother; Heart attack (age of onset: 80) in her mother; Heart attack (age of onset: 80) in her father; Heart disease in her father and  mother; Hypertension in her mother.  Laboratory Chemistry Profile   Renal Lab Results  Component Value Date   BUN 13 06/20/2020   CREATININE 0.87 06/20/2020   LABCREA 119 12/16/2017   BCR 17 04/11/2017   GFR 56.82 (L) 10/09/2018   GFRAA >60 04/19/2020   GFRNONAA >60 06/20/2020   EPIRU see note 12/16/2017   RTMYTRZ73VAP 3 09/29/2017  NOREPRU 13 09/29/2017   NOREPI24HUR 20 09/29/2017   DOPARU 313 12/16/2017   DOPAM24HRUR 215 09/29/2017     Hepatic Lab Results  Component Value Date   AST 17 10/19/2019   ALT 16 10/19/2019   ALBUMIN 4.0 10/19/2019   ALKPHOS 48 10/19/2019   LIPASE 27 10/19/2019     Electrolytes Lab Results  Component Value Date   NA 138 06/20/2020   K 4.0 06/20/2020   CL 103 06/20/2020   CALCIUM 9.0 06/20/2020   MG 1.9 05/19/2018   PHOS 2.7 04/25/2008     Bone Lab Results  Component Value Date   VD25OH 70.87 05/19/2018     Inflammation (CRP: Acute Phase) (ESR: Chronic Phase) Lab Results  Component Value Date   LATICACIDVEN 1.6 12/26/2009       Note: Above Lab results reviewed.  Recent Imaging Review  MR LUMBAR SPINE WO CONTRAST CLINICAL DATA:  Acquired scoliosis. Neurogenic claudication due to lumbar spinal stenosis.  EXAM: MRI LUMBAR SPINE WITHOUT CONTRAST  TECHNIQUE: Multiplanar, multisequence MR imaging of the lumbar spine was performed. No intravenous contrast was administered.  COMPARISON:  MRI of the lumbar spine April 29, 2019.  FINDINGS: Segmentation:  Standard.  Alignment: Levoconvex scoliosis of the lumbar spine. Small retrolisthesis from L1-2 through L4-5.  Vertebrae: No fracture, evidence of discitis, or bone lesion. Endplate degenerative changes and loss of disc height from T12-L1 through L4-5.  Conus medullaris and cauda equina: Conus extends to the T12-L1 level. Conus and cauda equina appear normal.  Paraspinal and other soft tissues: Left renal cyst.  Disc levels:  T12-L1: Left asymmetric disc bulge  and mild facet degenerative changes resulting in mild narrowing of the left subarticular zone and mild left neural foraminal narrowing. No spinal canal stenosis. No significant change from prior.  L1-2: Right asymmetric disc bulge and mild facet degenerative changes resulting in narrowing of the right subarticular zone, moderate right and mild left neural foraminal narrowing. No significant change from prior.  L2-3: Right asymmetric disc bulge and moderate facet degenerative changes resulting in narrowing of the right subarticular zone, moderate right and mild left neural foraminal narrowing, stable.  L3-4: Right asymmetric disc bulge with associated osteophytic component and moderate facet degenerative changes resulting in narrowing of the right subarticular zone, moderate right and mild left neural foraminal narrowing, stable.  L4-5: Left asymmetric disc bulge with associated osteophytic component and moderate facet degenerative changes. Findings result in mild spinal canal stenosis with narrowing of the bilateral subarticular zones, moderate right and severe left neural foraminal narrowing, stable.  L5-S1: Shallow disc bulge and prominent hypertrophic facet degenerative changes, left greater than right resulting in moderate left neural foraminal, progressed from prior MRI.  IMPRESSION: 1. Multilevel degenerative changes of the lumbar spine with multilevel subarticular zone narrowing and neural foraminal stenosis, as described above. 2. Mild spinal canal stenosis, moderate right and severe left neural foraminal narrowing at L4-5, unchanged. 3. Moderate left neural foraminal narrowing at L5-S1, progressed from prior MRI.  Electronically Signed   By: Pedro Earls M.D.   On: 10/25/2020 18:11 Note: Reviewed        Physical Exam  General appearance: alert, cooperative and in mild distress tearful during encounter due to pain Mental status: Alert, oriented x  3 (person, place, & time)       Respiratory: No evidence of acute respiratory distress Eyes: PERLA Vitals: BP 118/86   Pulse 68   Temp (!) 97 F (36.1 C)   Resp  18   Ht 5' 4"  (1.626 m)   Wt 160 lb (72.6 kg)   SpO2 99%   BMI 27.46 kg/m  BMI: Estimated body mass index is 27.46 kg/m as calculated from the following:   Height as of this encounter: 5' 4"  (1.626 m).   Weight as of this encounter: 160 lb (72.6 kg). Ideal: Ideal body weight: 54.7 kg (120 lb 9.5 oz) Adjusted ideal body weight: 61.9 kg (136 lb 5.7 oz)   Thoracic spine: Surgical scar present. Scoliosis noted. Mild tenderness noted along the scar. Lumbar spine: Limited range of motion, pain with lumbar extension. Scoliosis noted. Lower extremity:4out of 5 strength bilateral lower extremity: Plantar flexion, dorsiflexion, knee flexion, knee extension,antalgic gait, ambulates with walker, instability while walking Neuropathic pain pattern noted of lower extremity  Assessment   Status Diagnosis  Persistent Persistent Persistent 1. Failed spinal cord stimulator, subsequent encounter   2. Chronic radicular lumbar pain   3. Lumbar radiculopathy   4. Lumbar spondylosis   5. Lumbar facet arthropathy   6. Lumbar degenerative disc disease   7. Chronic pain syndrome   8. Chronic bilateral low back pain with left-sided sciatica   9. Spinal stenosis of cervical region   10. Spondylosis      Updated Problems: Problem  Lumbar Radiculopathy    Plan of Care   Ms. AMBRY DIX has a current medication list which includes the following long-term medication(s): atorvastatin, calcium, diltiazem, escitalopram, gabapentin, hydralazine, hydrochlorothiazide, levothyroxine, montelukast, myrbetriq, omeprazole, potassium chloride, sodium chloride, and trazodone.  1.  Increase hydrocodone to 7.5 mg every 6 hours as needed, quantity 120/month. 2.  Increase gabapentin to 600 mg 3 times a day 3.  Intramuscular Norflex and  Toradol today for acute on chronic pain 4.  Follow-up with Dr. Cari Caraway as scheduled  Pharmacotherapy (Medications Ordered): Meds ordered this encounter  Medications  . HYDROcodone-acetaminophen (NORCO) 7.5-325 MG tablet    Sig: Take 1 tablet by mouth every 6 (six) hours as needed for severe pain. Must last 30 days.    Dispense:  120 tablet    Refill:  0    Chronic Pain. (STOP Act - Not applicable). Fill one day early if closed on scheduled refill date.  Marland Kitchen HYDROcodone-acetaminophen (NORCO) 7.5-325 MG tablet    Sig: Take 1 tablet by mouth every 6 (six) hours as needed for severe pain. Must last 30 days.    Dispense:  120 tablet    Refill:  0    Chronic Pain. (STOP Act - Not applicable). Fill one day early if closed on scheduled refill date.  . orphenadrine (NORFLEX) injection 30 mg  . ketorolac (TORADOL) 30 MG/ML injection 30 mg  . gabapentin (NEURONTIN) 600 MG tablet    Sig: Take 1 tablet (600 mg total) by mouth 3 (three) times daily.    Dispense:  90 tablet    Refill:  2   Follow-up plan:   Return in about 8 weeks (around 12/28/2020) for Medication Management, in person.   Recent Visits Date Type Provider Dept  10/31/20 Appointment Gillis Santa, MD [facility]  09/05/20 Office Visit Gillis Santa, MD [facility]  08/08/20 Office Visit Gillis Santa, MD [facility]  Showing recent visits within past 90 days and meeting all other requirements Today's Visits Date Type Provider Dept  11/02/20 Office Visit Gillis Santa, MD [facility]  Showing today's visits and meeting all other requirements Future Appointments Date Type Provider Dept  12/28/20 Appointment Gillis Santa,  MD [facility]  Showing future appointments within next 90 days and meeting all other requirements  I discussed the assessment and treatment plan with the patient. The patient was provided an opportunity to ask questions and all were answered. The patient  agreed with the plan and demonstrated an understanding of the instructions.  Patient advised to call back or seek an in-person evaluation if the symptoms or condition worsens.  Duration of encounter: 30 minutes.  Note by: Gillis Santa, MD Date: 11/02/2020; Time: 12:25 PM

## 2020-11-02 NOTE — Progress Notes (Signed)
Nursing Pain Medication Assessment:  Safety precautions to be maintained throughout the outpatient stay will include: orient to surroundings, keep bed in low position, maintain call bell within reach at all times, provide assistance with transfer out of bed and ambulation.  Medication Inspection Compliance: Pill count conducted under aseptic conditions, in front of the patient. Neither the pills nor the bottle was removed from the patient's sight at any time. Once count was completed pills were immediately returned to the patient in their original bottle.  Medication: Hydrocodone/APAP Pill/Patch Count: 51 of 90 pills remain Pill/Patch Appearance: Markings consistent with prescribed medication Bottle Appearance: Standard pharmacy container. Clearly labeled. Filled Date: 03 / 17 / 2022 Last Medication intake:  Today

## 2020-11-07 DIAGNOSIS — R2681 Unsteadiness on feet: Secondary | ICD-10-CM | POA: Diagnosis not present

## 2020-11-07 DIAGNOSIS — M5459 Other low back pain: Secondary | ICD-10-CM | POA: Diagnosis not present

## 2020-11-07 DIAGNOSIS — R262 Difficulty in walking, not elsewhere classified: Secondary | ICD-10-CM | POA: Diagnosis not present

## 2020-11-07 DIAGNOSIS — M6281 Muscle weakness (generalized): Secondary | ICD-10-CM | POA: Diagnosis not present

## 2020-11-08 DIAGNOSIS — M4316 Spondylolisthesis, lumbar region: Secondary | ICD-10-CM | POA: Diagnosis not present

## 2020-11-08 DIAGNOSIS — M5459 Other low back pain: Secondary | ICD-10-CM | POA: Diagnosis not present

## 2020-11-08 DIAGNOSIS — M438X9 Other specified deforming dorsopathies, site unspecified: Secondary | ICD-10-CM | POA: Diagnosis not present

## 2020-11-08 DIAGNOSIS — R262 Difficulty in walking, not elsewhere classified: Secondary | ICD-10-CM | POA: Diagnosis not present

## 2020-11-08 DIAGNOSIS — R2681 Unsteadiness on feet: Secondary | ICD-10-CM | POA: Diagnosis not present

## 2020-11-08 DIAGNOSIS — M419 Scoliosis, unspecified: Secondary | ICD-10-CM | POA: Diagnosis not present

## 2020-11-08 DIAGNOSIS — M6281 Muscle weakness (generalized): Secondary | ICD-10-CM | POA: Diagnosis not present

## 2020-11-09 DIAGNOSIS — R262 Difficulty in walking, not elsewhere classified: Secondary | ICD-10-CM | POA: Diagnosis not present

## 2020-11-09 DIAGNOSIS — M5459 Other low back pain: Secondary | ICD-10-CM | POA: Diagnosis not present

## 2020-11-09 DIAGNOSIS — M6281 Muscle weakness (generalized): Secondary | ICD-10-CM | POA: Diagnosis not present

## 2020-11-09 DIAGNOSIS — R2681 Unsteadiness on feet: Secondary | ICD-10-CM | POA: Diagnosis not present

## 2020-11-13 DIAGNOSIS — M5459 Other low back pain: Secondary | ICD-10-CM | POA: Diagnosis not present

## 2020-11-13 DIAGNOSIS — M6281 Muscle weakness (generalized): Secondary | ICD-10-CM | POA: Diagnosis not present

## 2020-11-13 DIAGNOSIS — R262 Difficulty in walking, not elsewhere classified: Secondary | ICD-10-CM | POA: Diagnosis not present

## 2020-11-13 DIAGNOSIS — R2681 Unsteadiness on feet: Secondary | ICD-10-CM | POA: Diagnosis not present

## 2020-11-15 DIAGNOSIS — R2681 Unsteadiness on feet: Secondary | ICD-10-CM | POA: Diagnosis not present

## 2020-11-15 DIAGNOSIS — M6281 Muscle weakness (generalized): Secondary | ICD-10-CM | POA: Diagnosis not present

## 2020-11-15 DIAGNOSIS — M5459 Other low back pain: Secondary | ICD-10-CM | POA: Diagnosis not present

## 2020-11-15 DIAGNOSIS — R262 Difficulty in walking, not elsewhere classified: Secondary | ICD-10-CM | POA: Diagnosis not present

## 2020-11-19 DIAGNOSIS — R2681 Unsteadiness on feet: Secondary | ICD-10-CM | POA: Diagnosis not present

## 2020-11-19 DIAGNOSIS — M810 Age-related osteoporosis without current pathological fracture: Secondary | ICD-10-CM | POA: Diagnosis not present

## 2020-11-19 DIAGNOSIS — M5459 Other low back pain: Secondary | ICD-10-CM | POA: Diagnosis not present

## 2020-11-20 DIAGNOSIS — M5136 Other intervertebral disc degeneration, lumbar region: Secondary | ICD-10-CM | POA: Diagnosis not present

## 2020-11-20 DIAGNOSIS — M549 Dorsalgia, unspecified: Secondary | ICD-10-CM | POA: Diagnosis not present

## 2020-11-20 DIAGNOSIS — K5903 Drug induced constipation: Secondary | ICD-10-CM | POA: Diagnosis not present

## 2020-11-20 DIAGNOSIS — T402X5D Adverse effect of other opioids, subsequent encounter: Secondary | ICD-10-CM | POA: Diagnosis not present

## 2020-11-20 DIAGNOSIS — K219 Gastro-esophageal reflux disease without esophagitis: Secondary | ICD-10-CM | POA: Diagnosis not present

## 2020-11-20 DIAGNOSIS — I2510 Atherosclerotic heart disease of native coronary artery without angina pectoris: Secondary | ICD-10-CM | POA: Diagnosis not present

## 2020-11-20 DIAGNOSIS — R296 Repeated falls: Secondary | ICD-10-CM | POA: Diagnosis not present

## 2020-11-20 DIAGNOSIS — G8929 Other chronic pain: Secondary | ICD-10-CM | POA: Diagnosis not present

## 2020-11-20 DIAGNOSIS — R2681 Unsteadiness on feet: Secondary | ICD-10-CM | POA: Diagnosis not present

## 2020-11-21 DIAGNOSIS — M5459 Other low back pain: Secondary | ICD-10-CM | POA: Diagnosis not present

## 2020-11-21 DIAGNOSIS — M6281 Muscle weakness (generalized): Secondary | ICD-10-CM | POA: Diagnosis not present

## 2020-11-21 DIAGNOSIS — R2681 Unsteadiness on feet: Secondary | ICD-10-CM | POA: Diagnosis not present

## 2020-11-21 DIAGNOSIS — R262 Difficulty in walking, not elsewhere classified: Secondary | ICD-10-CM | POA: Diagnosis not present

## 2020-11-22 DIAGNOSIS — R262 Difficulty in walking, not elsewhere classified: Secondary | ICD-10-CM | POA: Diagnosis not present

## 2020-11-22 DIAGNOSIS — R2681 Unsteadiness on feet: Secondary | ICD-10-CM | POA: Diagnosis not present

## 2020-11-22 DIAGNOSIS — M6281 Muscle weakness (generalized): Secondary | ICD-10-CM | POA: Diagnosis not present

## 2020-11-22 DIAGNOSIS — M5459 Other low back pain: Secondary | ICD-10-CM | POA: Diagnosis not present

## 2020-11-23 DIAGNOSIS — M5459 Other low back pain: Secondary | ICD-10-CM | POA: Diagnosis not present

## 2020-11-23 DIAGNOSIS — M6281 Muscle weakness (generalized): Secondary | ICD-10-CM | POA: Diagnosis not present

## 2020-11-23 DIAGNOSIS — R2681 Unsteadiness on feet: Secondary | ICD-10-CM | POA: Diagnosis not present

## 2020-11-23 DIAGNOSIS — R262 Difficulty in walking, not elsewhere classified: Secondary | ICD-10-CM | POA: Diagnosis not present

## 2020-11-24 DIAGNOSIS — M6281 Muscle weakness (generalized): Secondary | ICD-10-CM | POA: Diagnosis not present

## 2020-11-24 DIAGNOSIS — M5459 Other low back pain: Secondary | ICD-10-CM | POA: Diagnosis not present

## 2020-11-24 DIAGNOSIS — R262 Difficulty in walking, not elsewhere classified: Secondary | ICD-10-CM | POA: Diagnosis not present

## 2020-11-24 DIAGNOSIS — R2681 Unsteadiness on feet: Secondary | ICD-10-CM | POA: Diagnosis not present

## 2020-11-27 ENCOUNTER — Other Ambulatory Visit: Payer: Self-pay | Admitting: Student in an Organized Health Care Education/Training Program

## 2020-11-27 DIAGNOSIS — R262 Difficulty in walking, not elsewhere classified: Secondary | ICD-10-CM | POA: Diagnosis not present

## 2020-11-27 DIAGNOSIS — R2681 Unsteadiness on feet: Secondary | ICD-10-CM | POA: Diagnosis not present

## 2020-11-27 DIAGNOSIS — M5459 Other low back pain: Secondary | ICD-10-CM | POA: Diagnosis not present

## 2020-11-27 DIAGNOSIS — T85192D Other mechanical complication of implanted electronic neurostimulator (electrode) of spinal cord, subsequent encounter: Secondary | ICD-10-CM

## 2020-11-27 DIAGNOSIS — M6281 Muscle weakness (generalized): Secondary | ICD-10-CM | POA: Diagnosis not present

## 2020-11-27 DIAGNOSIS — G894 Chronic pain syndrome: Secondary | ICD-10-CM

## 2020-11-27 DIAGNOSIS — G8929 Other chronic pain: Secondary | ICD-10-CM

## 2020-11-27 DIAGNOSIS — M5416 Radiculopathy, lumbar region: Secondary | ICD-10-CM

## 2020-11-28 DIAGNOSIS — R262 Difficulty in walking, not elsewhere classified: Secondary | ICD-10-CM | POA: Diagnosis not present

## 2020-11-28 DIAGNOSIS — M6281 Muscle weakness (generalized): Secondary | ICD-10-CM | POA: Diagnosis not present

## 2020-11-28 DIAGNOSIS — M5459 Other low back pain: Secondary | ICD-10-CM | POA: Diagnosis not present

## 2020-11-28 DIAGNOSIS — R2681 Unsteadiness on feet: Secondary | ICD-10-CM | POA: Diagnosis not present

## 2020-12-01 DIAGNOSIS — M5459 Other low back pain: Secondary | ICD-10-CM | POA: Diagnosis not present

## 2020-12-01 DIAGNOSIS — R2681 Unsteadiness on feet: Secondary | ICD-10-CM | POA: Diagnosis not present

## 2020-12-01 DIAGNOSIS — M6281 Muscle weakness (generalized): Secondary | ICD-10-CM | POA: Diagnosis not present

## 2020-12-01 DIAGNOSIS — R262 Difficulty in walking, not elsewhere classified: Secondary | ICD-10-CM | POA: Diagnosis not present

## 2020-12-04 DIAGNOSIS — M5459 Other low back pain: Secondary | ICD-10-CM | POA: Diagnosis not present

## 2020-12-04 DIAGNOSIS — M6281 Muscle weakness (generalized): Secondary | ICD-10-CM | POA: Diagnosis not present

## 2020-12-06 DIAGNOSIS — M5459 Other low back pain: Secondary | ICD-10-CM | POA: Diagnosis not present

## 2020-12-06 DIAGNOSIS — M6281 Muscle weakness (generalized): Secondary | ICD-10-CM | POA: Diagnosis not present

## 2020-12-07 DIAGNOSIS — M6281 Muscle weakness (generalized): Secondary | ICD-10-CM | POA: Diagnosis not present

## 2020-12-07 DIAGNOSIS — M5459 Other low back pain: Secondary | ICD-10-CM | POA: Diagnosis not present

## 2020-12-11 DIAGNOSIS — M6281 Muscle weakness (generalized): Secondary | ICD-10-CM | POA: Diagnosis not present

## 2020-12-11 DIAGNOSIS — M5459 Other low back pain: Secondary | ICD-10-CM | POA: Diagnosis not present

## 2020-12-12 DIAGNOSIS — M6281 Muscle weakness (generalized): Secondary | ICD-10-CM | POA: Diagnosis not present

## 2020-12-12 DIAGNOSIS — M5459 Other low back pain: Secondary | ICD-10-CM | POA: Diagnosis not present

## 2020-12-14 DIAGNOSIS — M6281 Muscle weakness (generalized): Secondary | ICD-10-CM | POA: Diagnosis not present

## 2020-12-14 DIAGNOSIS — M5459 Other low back pain: Secondary | ICD-10-CM | POA: Diagnosis not present

## 2020-12-15 DIAGNOSIS — M5459 Other low back pain: Secondary | ICD-10-CM | POA: Diagnosis not present

## 2020-12-15 DIAGNOSIS — M6281 Muscle weakness (generalized): Secondary | ICD-10-CM | POA: Diagnosis not present

## 2020-12-18 DIAGNOSIS — M5459 Other low back pain: Secondary | ICD-10-CM | POA: Diagnosis not present

## 2020-12-18 DIAGNOSIS — M6281 Muscle weakness (generalized): Secondary | ICD-10-CM | POA: Diagnosis not present

## 2020-12-19 DIAGNOSIS — M810 Age-related osteoporosis without current pathological fracture: Secondary | ICD-10-CM | POA: Diagnosis not present

## 2020-12-19 DIAGNOSIS — R2681 Unsteadiness on feet: Secondary | ICD-10-CM | POA: Diagnosis not present

## 2020-12-19 DIAGNOSIS — M5459 Other low back pain: Secondary | ICD-10-CM | POA: Diagnosis not present

## 2020-12-20 DIAGNOSIS — M6281 Muscle weakness (generalized): Secondary | ICD-10-CM | POA: Diagnosis not present

## 2020-12-20 DIAGNOSIS — M5459 Other low back pain: Secondary | ICD-10-CM | POA: Diagnosis not present

## 2020-12-21 DIAGNOSIS — M5459 Other low back pain: Secondary | ICD-10-CM | POA: Diagnosis not present

## 2020-12-21 DIAGNOSIS — M6281 Muscle weakness (generalized): Secondary | ICD-10-CM | POA: Diagnosis not present

## 2020-12-25 DIAGNOSIS — E782 Mixed hyperlipidemia: Secondary | ICD-10-CM | POA: Diagnosis not present

## 2020-12-25 DIAGNOSIS — I951 Orthostatic hypotension: Secondary | ICD-10-CM | POA: Diagnosis not present

## 2020-12-25 DIAGNOSIS — E039 Hypothyroidism, unspecified: Secondary | ICD-10-CM | POA: Diagnosis not present

## 2020-12-25 DIAGNOSIS — R42 Dizziness and giddiness: Secondary | ICD-10-CM | POA: Diagnosis not present

## 2020-12-25 DIAGNOSIS — Z01818 Encounter for other preprocedural examination: Secondary | ICD-10-CM | POA: Diagnosis not present

## 2020-12-25 DIAGNOSIS — R911 Solitary pulmonary nodule: Secondary | ICD-10-CM | POA: Diagnosis not present

## 2020-12-25 DIAGNOSIS — K219 Gastro-esophageal reflux disease without esophagitis: Secondary | ICD-10-CM | POA: Diagnosis not present

## 2020-12-25 DIAGNOSIS — I10 Essential (primary) hypertension: Secondary | ICD-10-CM | POA: Diagnosis not present

## 2020-12-25 DIAGNOSIS — I25118 Atherosclerotic heart disease of native coronary artery with other forms of angina pectoris: Secondary | ICD-10-CM | POA: Diagnosis not present

## 2020-12-25 DIAGNOSIS — M419 Scoliosis, unspecified: Secondary | ICD-10-CM | POA: Diagnosis not present

## 2020-12-25 DIAGNOSIS — F419 Anxiety disorder, unspecified: Secondary | ICD-10-CM | POA: Diagnosis not present

## 2020-12-27 DIAGNOSIS — M5459 Other low back pain: Secondary | ICD-10-CM | POA: Diagnosis not present

## 2020-12-27 DIAGNOSIS — M6281 Muscle weakness (generalized): Secondary | ICD-10-CM | POA: Diagnosis not present

## 2020-12-28 ENCOUNTER — Ambulatory Visit
Payer: Medicare HMO | Attending: Student in an Organized Health Care Education/Training Program | Admitting: Student in an Organized Health Care Education/Training Program

## 2020-12-28 ENCOUNTER — Encounter: Payer: Self-pay | Admitting: Student in an Organized Health Care Education/Training Program

## 2020-12-28 ENCOUNTER — Other Ambulatory Visit: Payer: Self-pay

## 2020-12-28 VITALS — BP 138/88 | HR 72 | Temp 96.8°F | Resp 16 | Ht 63.0 in | Wt 155.0 lb

## 2020-12-28 DIAGNOSIS — G894 Chronic pain syndrome: Secondary | ICD-10-CM | POA: Diagnosis not present

## 2020-12-28 DIAGNOSIS — T85192D Other mechanical complication of implanted electronic neurostimulator (electrode) of spinal cord, subsequent encounter: Secondary | ICD-10-CM | POA: Insufficient documentation

## 2020-12-28 DIAGNOSIS — M5416 Radiculopathy, lumbar region: Secondary | ICD-10-CM

## 2020-12-28 DIAGNOSIS — M5459 Other low back pain: Secondary | ICD-10-CM | POA: Diagnosis not present

## 2020-12-28 DIAGNOSIS — G8929 Other chronic pain: Secondary | ICD-10-CM | POA: Diagnosis not present

## 2020-12-28 DIAGNOSIS — M6281 Muscle weakness (generalized): Secondary | ICD-10-CM | POA: Diagnosis not present

## 2020-12-28 MED ORDER — HYDROCODONE-ACETAMINOPHEN 7.5-325 MG PO TABS: 1.0000 | ORAL_TABLET | Freq: Four times a day (QID) | ORAL | 0 refills | Status: AC | PRN

## 2020-12-28 MED ORDER — GABAPENTIN 600 MG PO TABS: 600.0000 mg | ORAL_TABLET | Freq: Three times a day (TID) | ORAL | 2 refills | Status: DC

## 2020-12-28 NOTE — Progress Notes (Signed)
PROVIDER NOTE: Information contained herein reflects review and annotations entered in association with encounter. Interpretation of such information and data should be left to medically-trained personnel. Information provided to patient can be located elsewhere in the medical record under "Patient Instructions". Document created using STT-dictation technology, any transcriptional errors that may result from process are unintentional.    Patient: Kimberly Cook  Service Category: E/M  Provider: Gillis Santa, MD  DOB: [birth year]  DOS: 12/28/2020  Specialty: Interventional Pain Management  MRN: [id number]  Setting: Ambulatory outpatient  PCP: Kimberly Dad, FNP  Type: Established Patient    Referring Provider: Virgie Dad, FNP  Location: Office  Delivery: Face-to-face     HPI  [name], a 75 y.o. year old female, is here today because of her Chronic radicular lumbar pain [M54.16, G89.29]. Kimberly Cook primary complain today is Back Pain Last encounter: My last encounter with her was on 11/27/2020. Pertinent problems: Kimberly Cook has Chronic pain; Chronic back pain; Lumbar spondylosis; Lumbar herniated disc; Lumbar degenerative disc disease; Lumbar radiculopathy; Lumbar facet arthropathy; and Spinal stenosis, lumbar region, with neurogenic claudication on their pertinent problem list. Pain Assessment: Severity of Acute pain is reported as a 8 /10. Location: Back Lower,Mid/radiates down legs to feet, left side is the worse. Onset: More than a month ago. Quality: Aching,Discomfort,Heaviness,Constant. Timing: Constant. Modifying factor(s): medications, rest on side with knees pulled up, heat. Vitals:  height is $RemoveB'5\' 3"'BPBFBBkM$  (1.6 m) and weight is 155 lb (70.3 kg). Her temperature is 96.8 F (36 C) (abnormal). Her blood pressure is 138/88 and her pulse is 72. Her respiration is 16 and oxygen saturation is 100%.   Reason for encounter: medication management.    Patient is having upcoming  extensive lumbar spine surgery L1-L5 lumbar interbody fusion with possible thoracic posterior spinal fusion as well.  She is having this done with Kimberly Cook at [facility].  I wish her the best.  We discussed perioperative pain management.  I recommend that her anesthesia care team consider perioperative ketamine infusion for pain management.  I will refill her hydrocodone.  She is taking 1 to 2 tablets every 6-12 hours.  She is in fairly severe pain.  She is looking forward to pain relief after surgery.  We discussed bowel health and utilizing Colace and MiraLAX as needed.  She continues gabapentin as prescribed.  Pharmacotherapy Assessment   Analgesic: Hydrocodone 7.5 mg every 6 hours as needed given increased pain    Monitoring: [place] PMP: PDMP reviewed during this encounter.       Pharmacotherapy: No side-effects or adverse reactions reported. Compliance: No problems identified. Effectiveness: Clinically acceptable.  Kimberly Specking, RN  12/28/2020  9:14 AM  Sign when Signing Visit Nursing Pain Medication Assessment:  Safety precautions to be maintained throughout the outpatient stay will include: orient to surroundings, keep bed in low position, maintain call bell within reach at all times, provide assistance with transfer out of bed and ambulation.  Medication Inspection Compliance: Pill count conducted under aseptic conditions, in front of the patient. Neither the pills nor the bottle was removed from the patient's sight at any time. Once count was completed pills were immediately returned to the patient in their original bottle.  Medication: Hydrocodone/APAP Pill/Patch Count: 75 of 120 pills remain Pill/Patch Appearance: Markings consistent with prescribed medication Bottle Appearance: Standard pharmacy container. Clearly labeled. Filled Date: 5 / 10 / 2022 Last Medication intake:  Today    UDS:  Summary  Date Value Ref  Range Status  08/08/2020 Note  Final    Comment:     ==================================================================== Compliance Drug Analysis, Ur ==================================================================== Test                             Result       Flag       Units  Drug Present and Declared for Prescription Verification   Lorazepam                      433          EXPECTED   ng/mg creat    Source of lorazepam is a scheduled prescription medication.    Gabapentin                     PRESENT      EXPECTED   Tizanidine                     PRESENT      EXPECTED   Citalopram                     PRESENT      EXPECTED   Desmethylcitalopram            PRESENT      EXPECTED    Desmethylcitalopram is an expected metabolite of citalopram or the    enantiomeric form, escitalopram.    Trazodone                      PRESENT      EXPECTED   1,3 chlorophenyl piperazine    PRESENT      EXPECTED    1,3-chlorophenyl piperazine is an expected metabolite of trazodone.    Acetaminophen                  PRESENT      EXPECTED   Diltiazem                      PRESENT      EXPECTED  Drug Absent but Declared for Prescription Verification   Hydrocodone                    Not Detected UNEXPECTED ng/mg creat   Salicylate                     Not Detected UNEXPECTED    Aspirin, as indicated in the declared medication list, is not always    detected even when used as directed.    Lidocaine                      Not Detected UNEXPECTED    Lidocaine, as indicated in the declared medication list, is not    always detected even when used as directed.  ==================================================================== Test                      Result    Flag   Units      Ref Range   Creatinine              189              mg/dL      >=20 ==================================================================== Declared Medications:  The flagging and interpretation on this report are based on the  following declared medications.  Unexpected results may  arise from  inaccuracies in the declared medications.   **Note: The testing scope of this panel includes these medications:   Diltiazem (Cardizem)  Escitalopram (Lexapro)  Gabapentin (Neurontin)  Hydrocodone (Norco)  Lorazepam (Ativan)  Trazodone (Desyrel)   **Note: The testing scope of this panel does not include small to  moderate amounts of these reported medications:   Acetaminophen (Tylenol)  Acetaminophen (Norco)  Aspirin  Lidocaine  Tizanidine (Zanaflex)   **Note: The testing scope of this panel does not include the  following reported medications:   Alendronate (Fosamax)  Atorvastatin (Lipitor)  Biotin  Cetirizine (Zyrtec)  Cranberry  Cyanocobalamin  Fluticasone (Flonase)  Folic Acid  Hydralazine (Apresoline)  Hydrochlorothiazide (Microzide)  Levothyroxine (Synthroid)  Mirabegron (Myrbetriq)  Montelukast (Singulair)  Naloxegol (Movantik)  Nitrofurantoin (Macrobid)  Omeprazole (Prilosec)  Polyethylene Glycol  Potassium (Klor-Con)  Sodium Chloride  Topical  Turmeric  Vitamin D3 ==================================================================== For clinical consultation, please call [phone]. ====================================================================      ROS  Constitutional: Denies any fever or chills Gastrointestinal: No reported hemesis, hematochezia, vomiting, or acute GI distress Musculoskeletal: Denies any acute onset joint swelling, redness, loss of ROM, or weakness Neurological: No reported episodes of acute onset apraxia, aphasia, dysarthria, agnosia, amnesia, paralysis, loss of coordination, or loss of consciousness  Medication Review  Biotin, Calcium, Cranberry, HYDROcodone-acetaminophen, LORazepam, Probiotic Product, Replens, Turmeric Curcumin, Vitamin D3, acetaminophen, alendronate, aspirin EC, atorvastatin, cetirizine, diltiazem, escitalopram, folic acid, gabapentin, hydrALAZINE, hydrochlorothiazide, levothyroxine,  lidocaine, mirabegron ER, montelukast, naloxegol oxalate, omeprazole, polyethylene glycol powder, potassium chloride, sodium chloride, tiZANidine, traZODone, and vitamin B-12  History Review  Allergy: Ms. Goodson has No Known Allergies. Drug: Ms. Hornberger  reports no history of drug use. Alcohol:  reports no history of alcohol use. Tobacco:  reports that she is a non-smoker but has been exposed to tobacco smoke. She has never used smokeless tobacco. Social: Ms. Bunner  reports that she is a non-smoker but has been exposed to tobacco smoke. She has never used smokeless tobacco. She reports that she does not drink alcohol and does not use drugs. Medical:  has a past medical history of Allergy, Anxiety, Arthritis, Back pain, Chronic kidney disease, Coronary artery disease (04/2017), DDD (degenerative disc disease), lumbar, Depression, Diverticulosis, Essential hypertension, GERD (gastroesophageal reflux disease), Headache, History of shingles, Hyperlipidemia, Hypothyroidism, Lung nodule, Mini stroke ([place]) (2011 ), Occipital neuralgia, Palpitations, Pneumonia (2018), Prediabetes, Stroke [facility]), Stroke [facility]), Syncope (2019), Urinary tract infection, and Vitamin D deficiency. Surgical: Ms. Matty  has a past surgical history that includes Cholecystectomy; Bladder surgery; Tonsillectomy and adenoidectomy; Knee arthroscopy (Left, 2011); Abdominal hysterectomy; gastroplication ; Total knee arthroplasty (Left, 04/17/2015); Breast excisional biopsy (Right, Over 20 years ); Joint replacement (Left); Thoracic laminectomy for spinal cord stimulator (N/A, 01/24/2020); Pulse generator implant (Left, 01/31/2020); right arm fracture; Pulse generator implant (Left, 04/24/2020); and Spinal cord stimulator removal (N/A, 06/26/2020). Family: family history includes Breast cancer in her maternal aunt; Diabetes in her mother; Heart attack in her brother; Heart attack (age of onset: 33) in her mother; Heart attack (age of onset:  48) in her father; Heart disease in her father and mother; Hypertension in her mother.  Laboratory Chemistry Profile   Renal Lab Results  Component Value Date   BUN 13 06/20/2020   CREATININE 0.87 06/20/2020   LABCREA 119 12/16/2017   BCR 17 04/11/2017   GFR 56.82 (L) 10/09/2018   GFRAA >60 04/19/2020   GFRNONAA >60 06/20/2020   EPIRU see note 12/16/2017   JJHERDE08XKG 3  09/29/2017   NOREPRU 13 09/29/2017   NOREPI24HUR 20 09/29/2017   DOPARU 313 12/16/2017   DOPAM24HRUR 215 09/29/2017     Hepatic Lab Results  Component Value Date   AST 17 10/19/2019   ALT 16 10/19/2019   ALBUMIN 4.0 10/19/2019   ALKPHOS 48 10/19/2019   LIPASE 27 10/19/2019     Electrolytes Lab Results  Component Value Date   NA 138 06/20/2020   K 4.0 06/20/2020   CL 103 06/20/2020   CALCIUM 9.0 06/20/2020   MG 1.9 05/19/2018   PHOS 2.7 04/25/2008     Bone Lab Results  Component Value Date   VD25OH 70.87 05/19/2018     Inflammation (CRP: Acute Phase) (ESR: Chronic Phase) Lab Results  Component Value Date   LATICACIDVEN 1.6 12/26/2009       Note: Above Lab results reviewed.  Recent Imaging Review  MR LUMBAR SPINE WO CONTRAST CLINICAL DATA:  Acquired scoliosis. Neurogenic claudication due to lumbar spinal stenosis.  EXAM: MRI LUMBAR SPINE WITHOUT CONTRAST  TECHNIQUE: Multiplanar, multisequence MR imaging of the lumbar spine was performed. No intravenous contrast was administered.  COMPARISON:  MRI of the lumbar spine April 29, 2019.  FINDINGS: Segmentation:  Standard.  Alignment: Levoconvex scoliosis of the lumbar spine. Small retrolisthesis from L1-2 through L4-5.  Vertebrae: No fracture, evidence of discitis, or bone lesion. Endplate degenerative changes and loss of disc height from T12-L1 through L4-5.  Conus medullaris and cauda equina: Conus extends to the T12-L1 level. Conus and cauda equina appear normal.  Paraspinal and other soft tissues: Left renal  cyst.  Disc levels:  T12-L1: Left asymmetric disc bulge and mild facet degenerative changes resulting in mild narrowing of the left subarticular zone and mild left neural foraminal narrowing. No spinal canal stenosis. No significant change from prior.  L1-2: Right asymmetric disc bulge and mild facet degenerative changes resulting in narrowing of the right subarticular zone, moderate right and mild left neural foraminal narrowing. No significant change from prior.  L2-3: Right asymmetric disc bulge and moderate facet degenerative changes resulting in narrowing of the right subarticular zone, moderate right and mild left neural foraminal narrowing, stable.  L3-4: Right asymmetric disc bulge with associated osteophytic component and moderate facet degenerative changes resulting in narrowing of the right subarticular zone, moderate right and mild left neural foraminal narrowing, stable.  L4-5: Left asymmetric disc bulge with associated osteophytic component and moderate facet degenerative changes. Findings result in mild spinal canal stenosis with narrowing of the bilateral subarticular zones, moderate right and severe left neural foraminal narrowing, stable.  L5-S1: Shallow disc bulge and prominent hypertrophic facet degenerative changes, left greater than right resulting in moderate left neural foraminal, progressed from prior MRI.  IMPRESSION: 1. Multilevel degenerative changes of the lumbar spine with multilevel subarticular zone narrowing and neural foraminal stenosis, as described above. 2. Mild spinal canal stenosis, moderate right and severe left neural foraminal narrowing at L4-5, unchanged. 3. Moderate left neural foraminal narrowing at L5-S1, progressed from prior MRI.  Electronically Signed   By: Pedro Earls M.D.   On: 10/25/2020 18:11 Note: Reviewed        Physical Exam  General appearance: Well nourished, well developed, and well hydrated.  In no apparent acute distress Mental status: Alert, oriented x 3 (person, place, & time)       Respiratory: No evidence of acute respiratory distress Eyes: PERLA Vitals: BP 138/88   Pulse 72   Temp (!) 96.8 F (36 C)  Resp 16   Ht _0  (1.6 m)   Wt 155 lb (70.3 kg)   SpO2 100%   BMI 27.46 kg/m  BMI: Estimated body mass index is 27.46 kg/m as calculated from the following:   Height as of this encounter: _1  (1.6 m).   Weight as of this encounter: 155 lb (70.3 kg). Ideal: Ideal body weight: 52.4 kg (115 lb 8.3 oz) Adjusted ideal body weight: 59.6 kg (131 lb 5 oz)  Thoracic spine: Surgical scar present. Scoliosis noted. Mild tenderness noted along the scar. Lumbar spine: Limited range of motion, pain with lumbar extension. Scoliosis noted. Lower extremity:4out of 5 strength bilateral lower extremity: Plantar flexion, dorsiflexion, knee flexion, knee extension,antalgic gait, ambulates with walker, instability while walking Neuropathic pain pattern noted of lower extremity  Assessment   Diagnosis  1. Chronic radicular lumbar pain   2. Failed spinal cord stimulator, subsequent encounter   3. Lumbar radiculopathy   4. Chronic pain syndrome      Plan of Care  Ms. CENIYAH THORP has a current medication list which includes the following long-term medication(s): atorvastatin, calcium, diltiazem, escitalopram, hydralazine, hydrochlorothiazide, levothyroxine, montelukast, myrbetriq, omeprazole, potassium chloride, sodium chloride, trazodone, and gabapentin.  Pharmacotherapy (Medications Ordered): Meds ordered this encounter  Medications  . HYDROcodone-acetaminophen (NORCO) 7.5-325 MG tablet    Sig: Take 1 tablet by mouth every 6 (six) hours as needed for severe pain. Must last 30 days.    Dispense:  120 tablet    Refill:  0    Chronic Pain. (STOP Act - Not applicable). Fill one day early if closed on scheduled refill date.  Marland Kitchen HYDROcodone-acetaminophen (NORCO) 7.5-325 MG  tablet    Sig: Take 1 tablet by mouth every 6 (six) hours as needed for severe pain. Must last 30 days.    Dispense:  120 tablet    Refill:  0    Chronic Pain. (STOP Act - Not applicable). Fill one day early if closed on scheduled refill date.  . gabapentin (NEURONTIN) 600 MG tablet    Sig: Take 1 tablet (600 mg total) by mouth 3 (three) times daily.    Dispense:  90 tablet    Refill:  2   Follow-up plan:   Return in about 2 months (around 03/06/2021) for Medication Management, in person.   Recent Visits Date Type Provider Dept  11/02/20 Office Visit Kimberly Santa, MD [facility]  Showing recent visits within past 90 days and meeting all other requirements Today's Visits Date Type Provider Dept  12/28/20 Office Visit Kimberly Santa, MD [facility]  Showing today's visits and meeting all other requirements Future Appointments No visits were found meeting these conditions. Showing future appointments within next 90 days and meeting all other requirements  I discussed the assessment and treatment plan with the patient. The patient was provided an opportunity to ask questions and all were answered. The patient agreed with the plan and demonstrated an understanding of the instructions.  Patient advised to call back or seek an in-person evaluation if the symptoms or condition worsens.  Duration of encounter: 30 minutes.  Note by: Kimberly Santa, MD Date: 12/28/2020; Time: 9:26 AM

## 2020-12-28 NOTE — Progress Notes (Signed)
Nursing Pain Medication Assessment:  Safety precautions to be maintained throughout the outpatient stay will include: orient to surroundings, keep bed in low position, maintain call bell within reach at all times, provide assistance with transfer out of bed and ambulation.  Medication Inspection Compliance: Pill count conducted under aseptic conditions, in front of the patient. Neither the pills nor the bottle was removed from the patient's sight at any time. Once count was completed pills were immediately returned to the patient in their original bottle.  Medication: Hydrocodone/APAP Pill/Patch Count: 75 of 120 pills remain Pill/Patch Appearance: Markings consistent with prescribed medication Bottle Appearance: Standard pharmacy container. Clearly labeled. Filled Date: 5 / 10 / 2022 Last Medication intake:  Today

## 2021-01-01 DIAGNOSIS — M5459 Other low back pain: Secondary | ICD-10-CM | POA: Diagnosis not present

## 2021-01-01 DIAGNOSIS — M6281 Muscle weakness (generalized): Secondary | ICD-10-CM | POA: Diagnosis not present

## 2021-01-03 ENCOUNTER — Other Ambulatory Visit: Payer: Self-pay | Admitting: Internal Medicine

## 2021-01-03 DIAGNOSIS — M6281 Muscle weakness (generalized): Secondary | ICD-10-CM | POA: Diagnosis not present

## 2021-01-03 DIAGNOSIS — M419 Scoliosis, unspecified: Secondary | ICD-10-CM

## 2021-01-03 DIAGNOSIS — M5030 Other cervical disc degeneration, unspecified cervical region: Secondary | ICD-10-CM

## 2021-01-03 DIAGNOSIS — M5416 Radiculopathy, lumbar region: Secondary | ICD-10-CM

## 2021-01-03 DIAGNOSIS — M47816 Spondylosis without myelopathy or radiculopathy, lumbar region: Secondary | ICD-10-CM

## 2021-01-03 DIAGNOSIS — M5459 Other low back pain: Secondary | ICD-10-CM | POA: Diagnosis not present

## 2021-01-03 MED ORDER — TIZANIDINE HCL 4 MG PO TABS: 4.0000 mg | ORAL_TABLET | Freq: Two times a day (BID) | ORAL | 3 refills | Status: DC | PRN

## 2021-01-03 NOTE — Telephone Encounter (Signed)
Look like patient has new PCP.

## 2021-01-04 DIAGNOSIS — M5459 Other low back pain: Secondary | ICD-10-CM | POA: Diagnosis not present

## 2021-01-04 DIAGNOSIS — M6281 Muscle weakness (generalized): Secondary | ICD-10-CM | POA: Diagnosis not present

## 2021-01-08 DIAGNOSIS — M5459 Other low back pain: Secondary | ICD-10-CM | POA: Diagnosis not present

## 2021-01-08 DIAGNOSIS — M6281 Muscle weakness (generalized): Secondary | ICD-10-CM | POA: Diagnosis not present

## 2021-01-10 DIAGNOSIS — M5459 Other low back pain: Secondary | ICD-10-CM | POA: Diagnosis not present

## 2021-01-10 DIAGNOSIS — M6281 Muscle weakness (generalized): Secondary | ICD-10-CM | POA: Diagnosis not present

## 2021-01-10 DIAGNOSIS — Z01818 Encounter for other preprocedural examination: Secondary | ICD-10-CM | POA: Diagnosis not present

## 2021-01-11 DIAGNOSIS — M6281 Muscle weakness (generalized): Secondary | ICD-10-CM | POA: Diagnosis not present

## 2021-01-11 DIAGNOSIS — M5459 Other low back pain: Secondary | ICD-10-CM | POA: Diagnosis not present

## 2021-01-12 DIAGNOSIS — D62 Acute posthemorrhagic anemia: Secondary | ICD-10-CM | POA: Diagnosis not present

## 2021-01-12 DIAGNOSIS — D735 Infarction of spleen: Secondary | ICD-10-CM | POA: Diagnosis not present

## 2021-01-12 DIAGNOSIS — F32A Depression, unspecified: Secondary | ICD-10-CM | POA: Diagnosis not present

## 2021-01-12 DIAGNOSIS — R0689 Other abnormalities of breathing: Secondary | ICD-10-CM | POA: Diagnosis not present

## 2021-01-12 DIAGNOSIS — M4327 Fusion of spine, lumbosacral region: Secondary | ICD-10-CM | POA: Diagnosis not present

## 2021-01-12 DIAGNOSIS — M419 Scoliosis, unspecified: Secondary | ICD-10-CM | POA: Diagnosis not present

## 2021-01-12 DIAGNOSIS — Z7401 Bed confinement status: Secondary | ICD-10-CM | POA: Diagnosis not present

## 2021-01-12 DIAGNOSIS — R0602 Shortness of breath: Secondary | ICD-10-CM | POA: Diagnosis not present

## 2021-01-12 DIAGNOSIS — G939 Disorder of brain, unspecified: Secondary | ICD-10-CM | POA: Diagnosis not present

## 2021-01-12 DIAGNOSIS — F419 Anxiety disorder, unspecified: Secondary | ICD-10-CM | POA: Diagnosis not present

## 2021-01-12 DIAGNOSIS — N183 Chronic kidney disease, stage 3 unspecified: Secondary | ICD-10-CM | POA: Diagnosis not present

## 2021-01-12 DIAGNOSIS — F418 Other specified anxiety disorders: Secondary | ICD-10-CM | POA: Diagnosis not present

## 2021-01-12 DIAGNOSIS — M4316 Spondylolisthesis, lumbar region: Secondary | ICD-10-CM | POA: Diagnosis not present

## 2021-01-12 DIAGNOSIS — T8119XA Other postprocedural shock, initial encounter: Secondary | ICD-10-CM | POA: Diagnosis not present

## 2021-01-12 DIAGNOSIS — N390 Urinary tract infection, site not specified: Secondary | ICD-10-CM | POA: Diagnosis not present

## 2021-01-12 DIAGNOSIS — R031 Nonspecific low blood-pressure reading: Secondary | ICD-10-CM | POA: Diagnosis not present

## 2021-01-12 DIAGNOSIS — I2510 Atherosclerotic heart disease of native coronary artery without angina pectoris: Secondary | ICD-10-CM | POA: Diagnosis not present

## 2021-01-12 DIAGNOSIS — E1122 Type 2 diabetes mellitus with diabetic chronic kidney disease: Secondary | ICD-10-CM | POA: Diagnosis not present

## 2021-01-12 DIAGNOSIS — R54 Age-related physical debility: Secondary | ICD-10-CM | POA: Diagnosis not present

## 2021-01-12 DIAGNOSIS — R578 Other shock: Secondary | ICD-10-CM | POA: Diagnosis not present

## 2021-01-12 DIAGNOSIS — Z8673 Personal history of transient ischemic attack (TIA), and cerebral infarction without residual deficits: Secondary | ICD-10-CM | POA: Diagnosis not present

## 2021-01-12 DIAGNOSIS — M48062 Spinal stenosis, lumbar region with neurogenic claudication: Secondary | ICD-10-CM | POA: Diagnosis not present

## 2021-01-12 DIAGNOSIS — M6281 Muscle weakness (generalized): Secondary | ICD-10-CM | POA: Diagnosis not present

## 2021-01-12 DIAGNOSIS — M438X7 Other specified deforming dorsopathies, lumbosacral region: Secondary | ICD-10-CM | POA: Diagnosis not present

## 2021-01-12 DIAGNOSIS — M5134 Other intervertebral disc degeneration, thoracic region: Secondary | ICD-10-CM | POA: Diagnosis not present

## 2021-01-12 DIAGNOSIS — I10 Essential (primary) hypertension: Secondary | ICD-10-CM | POA: Diagnosis not present

## 2021-01-12 DIAGNOSIS — G4700 Insomnia, unspecified: Secondary | ICD-10-CM | POA: Diagnosis not present

## 2021-01-12 DIAGNOSIS — E039 Hypothyroidism, unspecified: Secondary | ICD-10-CM | POA: Diagnosis not present

## 2021-01-12 DIAGNOSIS — E871 Hypo-osmolality and hyponatremia: Secondary | ICD-10-CM | POA: Diagnosis not present

## 2021-01-12 DIAGNOSIS — M4326 Fusion of spine, lumbar region: Secondary | ICD-10-CM | POA: Diagnosis not present

## 2021-01-12 DIAGNOSIS — M5459 Other low back pain: Secondary | ICD-10-CM | POA: Diagnosis not present

## 2021-01-12 DIAGNOSIS — M4185 Other forms of scoliosis, thoracolumbar region: Secondary | ICD-10-CM | POA: Diagnosis not present

## 2021-01-12 DIAGNOSIS — M4320 Fusion of spine, site unspecified: Secondary | ICD-10-CM | POA: Diagnosis not present

## 2021-01-12 DIAGNOSIS — K5900 Constipation, unspecified: Secondary | ICD-10-CM | POA: Diagnosis not present

## 2021-01-12 DIAGNOSIS — R739 Hyperglycemia, unspecified: Secondary | ICD-10-CM | POA: Diagnosis not present

## 2021-01-12 DIAGNOSIS — K219 Gastro-esophageal reflux disease without esophagitis: Secondary | ICD-10-CM | POA: Diagnosis not present

## 2021-01-12 DIAGNOSIS — R2681 Unsteadiness on feet: Secondary | ICD-10-CM | POA: Diagnosis not present

## 2021-01-12 DIAGNOSIS — M438X9 Other specified deforming dorsopathies, site unspecified: Secondary | ICD-10-CM | POA: Diagnosis not present

## 2021-01-12 DIAGNOSIS — M40204 Unspecified kyphosis, thoracic region: Secondary | ICD-10-CM | POA: Diagnosis not present

## 2021-01-12 DIAGNOSIS — Z428 Encounter for other plastic and reconstructive surgery following medical procedure or healed injury: Secondary | ICD-10-CM | POA: Diagnosis not present

## 2021-01-12 DIAGNOSIS — R410 Disorientation, unspecified: Secondary | ICD-10-CM | POA: Diagnosis not present

## 2021-01-12 DIAGNOSIS — I25118 Atherosclerotic heart disease of native coronary artery with other forms of angina pectoris: Secondary | ICD-10-CM | POA: Diagnosis not present

## 2021-01-12 DIAGNOSIS — R52 Pain, unspecified: Secondary | ICD-10-CM | POA: Diagnosis not present

## 2021-01-12 DIAGNOSIS — N3281 Overactive bladder: Secondary | ICD-10-CM | POA: Diagnosis not present

## 2021-01-12 DIAGNOSIS — R42 Dizziness and giddiness: Secondary | ICD-10-CM | POA: Diagnosis not present

## 2021-01-12 DIAGNOSIS — E119 Type 2 diabetes mellitus without complications: Secondary | ICD-10-CM | POA: Diagnosis not present

## 2021-01-12 DIAGNOSIS — Z9889 Other specified postprocedural states: Secondary | ICD-10-CM | POA: Diagnosis not present

## 2021-01-12 DIAGNOSIS — E782 Mixed hyperlipidemia: Secondary | ICD-10-CM | POA: Diagnosis not present

## 2021-01-12 DIAGNOSIS — M810 Age-related osteoporosis without current pathological fracture: Secondary | ICD-10-CM | POA: Diagnosis not present

## 2021-01-12 DIAGNOSIS — M4180 Other forms of scoliosis, site unspecified: Secondary | ICD-10-CM | POA: Diagnosis not present

## 2021-01-15 DIAGNOSIS — M4316 Spondylolisthesis, lumbar region: Secondary | ICD-10-CM | POA: Diagnosis not present

## 2021-01-15 DIAGNOSIS — M438X9 Other specified deforming dorsopathies, site unspecified: Secondary | ICD-10-CM | POA: Diagnosis not present

## 2021-01-15 DIAGNOSIS — E119 Type 2 diabetes mellitus without complications: Secondary | ICD-10-CM | POA: Diagnosis not present

## 2021-01-15 DIAGNOSIS — Z428 Encounter for other plastic and reconstructive surgery following medical procedure or healed injury: Secondary | ICD-10-CM | POA: Diagnosis not present

## 2021-01-15 DIAGNOSIS — M419 Scoliosis, unspecified: Secondary | ICD-10-CM | POA: Diagnosis not present

## 2021-01-26 DIAGNOSIS — Z7401 Bed confinement status: Secondary | ICD-10-CM | POA: Diagnosis not present

## 2021-01-26 DIAGNOSIS — I700 Atherosclerosis of aorta: Secondary | ICD-10-CM | POA: Diagnosis not present

## 2021-01-26 DIAGNOSIS — N1831 Chronic kidney disease, stage 3a: Secondary | ICD-10-CM | POA: Diagnosis not present

## 2021-01-26 DIAGNOSIS — E039 Hypothyroidism, unspecified: Secondary | ICD-10-CM | POA: Diagnosis not present

## 2021-01-26 DIAGNOSIS — F419 Anxiety disorder, unspecified: Secondary | ICD-10-CM | POA: Diagnosis not present

## 2021-01-26 DIAGNOSIS — F330 Major depressive disorder, recurrent, mild: Secondary | ICD-10-CM | POA: Diagnosis not present

## 2021-01-26 DIAGNOSIS — N183 Chronic kidney disease, stage 3 unspecified: Secondary | ICD-10-CM | POA: Diagnosis not present

## 2021-01-26 DIAGNOSIS — I10 Essential (primary) hypertension: Secondary | ICD-10-CM | POA: Diagnosis not present

## 2021-01-26 DIAGNOSIS — M6281 Muscle weakness (generalized): Secondary | ICD-10-CM | POA: Diagnosis not present

## 2021-01-26 DIAGNOSIS — R5382 Chronic fatigue, unspecified: Secondary | ICD-10-CM | POA: Diagnosis not present

## 2021-01-26 DIAGNOSIS — M5459 Other low back pain: Secondary | ICD-10-CM | POA: Diagnosis not present

## 2021-01-26 DIAGNOSIS — R5381 Other malaise: Secondary | ICD-10-CM | POA: Diagnosis not present

## 2021-01-26 DIAGNOSIS — D7389 Other diseases of spleen: Secondary | ICD-10-CM | POA: Diagnosis not present

## 2021-01-26 DIAGNOSIS — R2681 Unsteadiness on feet: Secondary | ICD-10-CM | POA: Diagnosis not present

## 2021-01-26 DIAGNOSIS — K567 Ileus, unspecified: Secondary | ICD-10-CM | POA: Diagnosis not present

## 2021-01-26 DIAGNOSIS — R270 Ataxia, unspecified: Secondary | ICD-10-CM | POA: Diagnosis not present

## 2021-01-26 DIAGNOSIS — I25118 Atherosclerotic heart disease of native coronary artery with other forms of angina pectoris: Secondary | ICD-10-CM | POA: Diagnosis not present

## 2021-01-26 DIAGNOSIS — Z7722 Contact with and (suspected) exposure to environmental tobacco smoke (acute) (chronic): Secondary | ICD-10-CM | POA: Diagnosis not present

## 2021-01-26 DIAGNOSIS — K1230 Oral mucositis (ulcerative), unspecified: Secondary | ICD-10-CM | POA: Diagnosis not present

## 2021-01-26 DIAGNOSIS — Z743 Need for continuous supervision: Secondary | ICD-10-CM | POA: Diagnosis not present

## 2021-01-26 DIAGNOSIS — R54 Age-related physical debility: Secondary | ICD-10-CM | POA: Diagnosis not present

## 2021-01-26 DIAGNOSIS — M810 Age-related osteoporosis without current pathological fracture: Secondary | ICD-10-CM | POA: Diagnosis not present

## 2021-01-26 DIAGNOSIS — K5900 Constipation, unspecified: Secondary | ICD-10-CM | POA: Diagnosis not present

## 2021-01-26 DIAGNOSIS — M62838 Other muscle spasm: Secondary | ICD-10-CM | POA: Diagnosis not present

## 2021-01-26 DIAGNOSIS — F32A Depression, unspecified: Secondary | ICD-10-CM | POA: Diagnosis not present

## 2021-01-26 DIAGNOSIS — D649 Anemia, unspecified: Secondary | ICD-10-CM | POA: Diagnosis not present

## 2021-01-26 DIAGNOSIS — M4326 Fusion of spine, lumbar region: Secondary | ICD-10-CM | POA: Diagnosis not present

## 2021-01-26 DIAGNOSIS — Z7982 Long term (current) use of aspirin: Secondary | ICD-10-CM | POA: Diagnosis not present

## 2021-01-26 DIAGNOSIS — G603 Idiopathic progressive neuropathy: Secondary | ICD-10-CM | POA: Diagnosis not present

## 2021-01-26 DIAGNOSIS — Z981 Arthrodesis status: Secondary | ICD-10-CM | POA: Diagnosis not present

## 2021-01-26 DIAGNOSIS — M4180 Other forms of scoliosis, site unspecified: Secondary | ICD-10-CM | POA: Diagnosis not present

## 2021-01-26 DIAGNOSIS — Z96652 Presence of left artificial knee joint: Secondary | ICD-10-CM | POA: Diagnosis not present

## 2021-01-26 DIAGNOSIS — R52 Pain, unspecified: Secondary | ICD-10-CM | POA: Diagnosis not present

## 2021-01-26 DIAGNOSIS — I129 Hypertensive chronic kidney disease with stage 1 through stage 4 chronic kidney disease, or unspecified chronic kidney disease: Secondary | ICD-10-CM | POA: Diagnosis not present

## 2021-01-26 DIAGNOSIS — Z79899 Other long term (current) drug therapy: Secondary | ICD-10-CM | POA: Diagnosis not present

## 2021-01-26 DIAGNOSIS — N139 Obstructive and reflux uropathy, unspecified: Secondary | ICD-10-CM | POA: Diagnosis not present

## 2021-01-26 DIAGNOSIS — K121 Other forms of stomatitis: Secondary | ICD-10-CM | POA: Diagnosis not present

## 2021-01-26 DIAGNOSIS — R339 Retention of urine, unspecified: Secondary | ICD-10-CM | POA: Diagnosis not present

## 2021-01-26 DIAGNOSIS — M419 Scoliosis, unspecified: Secondary | ICD-10-CM | POA: Diagnosis not present

## 2021-01-26 DIAGNOSIS — R300 Dysuria: Secondary | ICD-10-CM | POA: Diagnosis not present

## 2021-01-26 DIAGNOSIS — R531 Weakness: Secondary | ICD-10-CM | POA: Diagnosis not present

## 2021-01-26 DIAGNOSIS — M4324 Fusion of spine, thoracic region: Secondary | ICD-10-CM | POA: Diagnosis not present

## 2021-01-26 DIAGNOSIS — I2510 Atherosclerotic heart disease of native coronary artery without angina pectoris: Secondary | ICD-10-CM | POA: Diagnosis not present

## 2021-01-26 DIAGNOSIS — N390 Urinary tract infection, site not specified: Secondary | ICD-10-CM | POA: Diagnosis not present

## 2021-01-26 DIAGNOSIS — G894 Chronic pain syndrome: Secondary | ICD-10-CM | POA: Diagnosis not present

## 2021-01-26 DIAGNOSIS — W06XXXA Fall from bed, initial encounter: Secondary | ICD-10-CM | POA: Diagnosis not present

## 2021-01-29 DIAGNOSIS — N390 Urinary tract infection, site not specified: Secondary | ICD-10-CM | POA: Diagnosis not present

## 2021-01-29 DIAGNOSIS — E039 Hypothyroidism, unspecified: Secondary | ICD-10-CM | POA: Diagnosis not present

## 2021-01-29 DIAGNOSIS — I10 Essential (primary) hypertension: Secondary | ICD-10-CM | POA: Diagnosis not present

## 2021-01-29 DIAGNOSIS — N1831 Chronic kidney disease, stage 3a: Secondary | ICD-10-CM | POA: Diagnosis not present

## 2021-01-29 DIAGNOSIS — N139 Obstructive and reflux uropathy, unspecified: Secondary | ICD-10-CM | POA: Diagnosis not present

## 2021-01-30 ENCOUNTER — Other Ambulatory Visit: Payer: Self-pay

## 2021-01-30 DIAGNOSIS — Z981 Arthrodesis status: Secondary | ICD-10-CM

## 2021-01-31 DIAGNOSIS — D649 Anemia, unspecified: Secondary | ICD-10-CM | POA: Diagnosis not present

## 2021-01-31 DIAGNOSIS — E039 Hypothyroidism, unspecified: Secondary | ICD-10-CM | POA: Diagnosis not present

## 2021-01-31 DIAGNOSIS — I10 Essential (primary) hypertension: Secondary | ICD-10-CM | POA: Diagnosis not present

## 2021-01-31 DIAGNOSIS — M4180 Other forms of scoliosis, site unspecified: Secondary | ICD-10-CM | POA: Diagnosis not present

## 2021-01-31 DIAGNOSIS — R270 Ataxia, unspecified: Secondary | ICD-10-CM | POA: Diagnosis not present

## 2021-01-31 DIAGNOSIS — N1831 Chronic kidney disease, stage 3a: Secondary | ICD-10-CM | POA: Diagnosis not present

## 2021-02-01 DIAGNOSIS — M4180 Other forms of scoliosis, site unspecified: Secondary | ICD-10-CM | POA: Diagnosis not present

## 2021-02-01 DIAGNOSIS — K567 Ileus, unspecified: Secondary | ICD-10-CM | POA: Diagnosis not present

## 2021-02-01 DIAGNOSIS — D649 Anemia, unspecified: Secondary | ICD-10-CM | POA: Diagnosis not present

## 2021-02-01 DIAGNOSIS — R270 Ataxia, unspecified: Secondary | ICD-10-CM | POA: Diagnosis not present

## 2021-02-02 DIAGNOSIS — R270 Ataxia, unspecified: Secondary | ICD-10-CM | POA: Diagnosis not present

## 2021-02-02 DIAGNOSIS — D649 Anemia, unspecified: Secondary | ICD-10-CM | POA: Diagnosis not present

## 2021-02-02 DIAGNOSIS — R339 Retention of urine, unspecified: Secondary | ICD-10-CM | POA: Diagnosis not present

## 2021-02-02 DIAGNOSIS — M62838 Other muscle spasm: Secondary | ICD-10-CM | POA: Diagnosis not present

## 2021-02-18 DIAGNOSIS — R2681 Unsteadiness on feet: Secondary | ICD-10-CM | POA: Diagnosis not present

## 2021-02-18 DIAGNOSIS — M5459 Other low back pain: Secondary | ICD-10-CM | POA: Diagnosis not present

## 2021-02-18 DIAGNOSIS — M810 Age-related osteoporosis without current pathological fracture: Secondary | ICD-10-CM | POA: Diagnosis not present

## 2021-02-26 ENCOUNTER — Other Ambulatory Visit: Payer: Self-pay | Admitting: Neurosurgery

## 2021-02-26 DIAGNOSIS — F330 Major depressive disorder, recurrent, mild: Secondary | ICD-10-CM | POA: Diagnosis not present

## 2021-02-26 DIAGNOSIS — G894 Chronic pain syndrome: Secondary | ICD-10-CM | POA: Diagnosis not present

## 2021-02-26 DIAGNOSIS — G603 Idiopathic progressive neuropathy: Secondary | ICD-10-CM | POA: Diagnosis not present

## 2021-02-26 DIAGNOSIS — Z981 Arthrodesis status: Secondary | ICD-10-CM

## 2021-02-26 DIAGNOSIS — I10 Essential (primary) hypertension: Secondary | ICD-10-CM | POA: Diagnosis not present

## 2021-03-01 ENCOUNTER — Ambulatory Visit
Admission: RE | Admit: 2021-03-01 | Discharge: 2021-03-01 | Disposition: A | Discharge status: Home / Self Care | Payer: Medicare HMO | Source: Ambulatory Visit | Attending: Neurosurgery | Admitting: Neurosurgery

## 2021-03-01 ENCOUNTER — Encounter: Payer: Medicare HMO | Admitting: Student in an Organized Health Care Education/Training Program

## 2021-03-01 DIAGNOSIS — I700 Atherosclerosis of aorta: Secondary | ICD-10-CM | POA: Diagnosis not present

## 2021-03-01 DIAGNOSIS — M419 Scoliosis, unspecified: Secondary | ICD-10-CM | POA: Diagnosis not present

## 2021-03-01 DIAGNOSIS — M4326 Fusion of spine, lumbar region: Secondary | ICD-10-CM | POA: Diagnosis not present

## 2021-03-01 DIAGNOSIS — D7389 Other diseases of spleen: Secondary | ICD-10-CM | POA: Diagnosis not present

## 2021-03-01 DIAGNOSIS — Z981 Arthrodesis status: Secondary | ICD-10-CM | POA: Diagnosis not present

## 2021-03-01 DIAGNOSIS — M4324 Fusion of spine, thoracic region: Secondary | ICD-10-CM | POA: Diagnosis not present

## 2021-03-07 DIAGNOSIS — R2681 Unsteadiness on feet: Secondary | ICD-10-CM | POA: Diagnosis not present

## 2021-03-07 DIAGNOSIS — K5900 Constipation, unspecified: Secondary | ICD-10-CM | POA: Diagnosis not present

## 2021-03-10 DIAGNOSIS — R54 Age-related physical debility: Secondary | ICD-10-CM | POA: Diagnosis not present

## 2021-03-10 DIAGNOSIS — M6281 Muscle weakness (generalized): Secondary | ICD-10-CM | POA: Diagnosis not present

## 2021-03-10 DIAGNOSIS — R5382 Chronic fatigue, unspecified: Secondary | ICD-10-CM | POA: Diagnosis not present

## 2021-03-15 DIAGNOSIS — K5900 Constipation, unspecified: Secondary | ICD-10-CM | POA: Diagnosis not present

## 2021-03-15 DIAGNOSIS — R2681 Unsteadiness on feet: Secondary | ICD-10-CM | POA: Diagnosis not present

## 2021-03-15 DIAGNOSIS — W06XXXA Fall from bed, initial encounter: Secondary | ICD-10-CM | POA: Diagnosis not present

## 2021-03-18 ENCOUNTER — Emergency Department
Admission: EM | Admit: 2021-03-18 | Discharge: 2021-03-18 | Disposition: A | Discharge status: Home / Self Care | Payer: Medicare HMO | Attending: Emergency Medicine | Admitting: Emergency Medicine

## 2021-03-18 ENCOUNTER — Other Ambulatory Visit: Payer: Self-pay

## 2021-03-18 DIAGNOSIS — Z7982 Long term (current) use of aspirin: Secondary | ICD-10-CM | POA: Insufficient documentation

## 2021-03-18 DIAGNOSIS — E039 Hypothyroidism, unspecified: Secondary | ICD-10-CM | POA: Insufficient documentation

## 2021-03-18 DIAGNOSIS — Z79899 Other long term (current) drug therapy: Secondary | ICD-10-CM | POA: Diagnosis not present

## 2021-03-18 DIAGNOSIS — N183 Chronic kidney disease, stage 3 unspecified: Secondary | ICD-10-CM | POA: Insufficient documentation

## 2021-03-18 DIAGNOSIS — K121 Other forms of stomatitis: Secondary | ICD-10-CM

## 2021-03-18 DIAGNOSIS — Z7722 Contact with and (suspected) exposure to environmental tobacco smoke (acute) (chronic): Secondary | ICD-10-CM | POA: Insufficient documentation

## 2021-03-18 DIAGNOSIS — I25118 Atherosclerotic heart disease of native coronary artery with other forms of angina pectoris: Secondary | ICD-10-CM | POA: Diagnosis not present

## 2021-03-18 DIAGNOSIS — Z96652 Presence of left artificial knee joint: Secondary | ICD-10-CM | POA: Diagnosis not present

## 2021-03-18 DIAGNOSIS — I129 Hypertensive chronic kidney disease with stage 1 through stage 4 chronic kidney disease, or unspecified chronic kidney disease: Secondary | ICD-10-CM | POA: Insufficient documentation

## 2021-03-18 DIAGNOSIS — K1230 Oral mucositis (ulcerative), unspecified: Secondary | ICD-10-CM | POA: Insufficient documentation

## 2021-03-18 LAB — CBC WITH DIFFERENTIAL/PLATELET
Abs Immature Granulocytes: 0.02 10*3/uL (ref 0.00–0.07)
Basophils Absolute: 0 10*3/uL (ref 0.0–0.1)
Basophils Relative: 1 %
Eosinophils Absolute: 0.1 10*3/uL (ref 0.0–0.5)
Eosinophils Relative: 2 %
HCT: 38 % (ref 36.0–46.0)
Hemoglobin: 13.2 g/dL (ref 12.0–15.0)
Immature Granulocytes: 0 %
Lymphocytes Relative: 21 %
Lymphs Abs: 1.3 10*3/uL (ref 0.7–4.0)
MCH: 32.4 pg (ref 26.0–34.0)
MCHC: 34.7 g/dL (ref 30.0–36.0)
MCV: 93.4 fL (ref 80.0–100.0)
Monocytes Absolute: 0.3 10*3/uL (ref 0.1–1.0)
Monocytes Relative: 5 %
Neutro Abs: 4.5 10*3/uL (ref 1.7–7.7)
Neutrophils Relative %: 71 %
Platelets: 230 10*3/uL (ref 150–400)
RBC: 4.07 MIL/uL (ref 3.87–5.11)
RDW: 13.7 % (ref 11.5–15.5)
WBC: 6.3 10*3/uL (ref 4.0–10.5)
nRBC: 0 % (ref 0.0–0.2)

## 2021-03-18 LAB — BASIC METABOLIC PANEL
Anion gap: 7 (ref 5–15)
BUN: 10 mg/dL (ref 8–23)
CO2: 26 mmol/L (ref 22–32)
Calcium: 8.4 mg/dL — ABNORMAL LOW (ref 8.9–10.3)
Chloride: 101 mmol/L (ref 98–111)
Creatinine, Ser: 0.61 mg/dL (ref 0.44–1.00)
GFR, Estimated: >60 mL/min (ref >60)
Glucose, Bld: 107 mg/dL — ABNORMAL HIGH (ref 70–99)
Potassium: 3.4 mmol/L — ABNORMAL LOW (ref 3.5–5.1)
Sodium: 134 mmol/L — ABNORMAL LOW (ref 135–145)

## 2021-03-18 MED ORDER — CLOTRIMAZOLE 10 MG MT TROC: 10.0000 mg | Freq: Every day | OROMUCOSAL | 0 refills | Status: DC

## 2021-03-18 MED ORDER — SODIUM CHLORIDE 0.9 % IV BOLUS
1000.0000 mL | Freq: Once | INTRAVENOUS | Status: AC
  Administered 2021-03-18: 1000 mL via INTRAVENOUS

## 2021-03-18 MED ORDER — CLOTRIMAZOLE 10 MG MT TROC
10.0000 mg | Freq: Every day | OROMUCOSAL | Status: DC
  Administered 2021-03-18: 10 mg via ORAL
  Filled 2021-03-18 (×4): qty 1

## 2021-03-18 MED ORDER — LIDOCAINE VISCOUS HCL 2 % MT SOLN
15.0000 mL | Freq: Once | OROMUCOSAL | Status: AC
  Administered 2021-03-18: 15 mL via OROMUCOSAL
  Filled 2021-03-18: qty 15

## 2021-03-18 MED ORDER — NYSTATIN 100000 UNIT/ML MT SUSP: 5.0000 mL | Freq: Four times a day (QID) | OROMUCOSAL | 0 refills | Status: DC | PRN

## 2021-03-18 NOTE — ED Provider Notes (Signed)
[facility] Emergency Department Provider Note   ____________________________________________   Event Date/Time   First MD Initiated Contact with Patient 03/18/21 1920     (approximate)  I have reviewed the triage vital signs and the nursing notes.   HISTORY  Chief Complaint Mouth Lesions  HPI Kimberly Cook is a 75 y.o. female with a history of CKD, CAD, chronic back pain presents to the ED from [facility] rehab facility.  Patient had a recent spinal fusion secondary to spinal stenosis.  Dr. Elayne Guerin performed a L1-5 XLIF procedure.  Patient had been progressing well, until about 5 days ago when she began to develop some shallow oral lesions.  They made it difficult for her to drink liquids, ultimately swallow solid foods.  She presents to the ED for evaluation of her symptoms.  She voices some concern for dehydration noted she has not had much to eat or drink in the last 5 days patient denies any fever, chills, sweats, chest pain, or shortness of breath.  Patient denies any significant back pain or distal paresthesias.  Past Medical History:  Diagnosis Date   Allergy    Anxiety    Arthritis    Back pain    Chronic kidney disease    STAGE 3   Coronary artery disease 04/2017   Mild to moderate CAD in LAD/diagonal by CTA (CT-FFR of apical LAD 0.79).   DDD (degenerative disc disease), lumbar    Depression    Diverticulosis    Essential hypertension    Normal cardiolite 05/2006 EF 71%   GERD (gastroesophageal reflux disease)    Headache    History of shingles    Hyperlipidemia    Hypothyroidism    Lung nodule    Mini stroke ([place]) 2011    Occipital neuralgia    Palpitations    Pneumonia 2018   Prediabetes    Stroke [facility])    Stroke ([place])    MRI 04/2008 + left sup. frontal gyrus possibly puntate infarct    Syncope 2019   Urinary tract infection    Vitamin D deficiency     Patient Active Problem List   Diagnosis Date Noted   Failed  spinal cord stimulator, subsequent encounter 08/08/2020   S/P insertion of spinal cord stimulator 04/25/2020   Lumbar radiculopathy 12/07/2019   Lumbar facet arthropathy 12/07/2019   Spinal stenosis, lumbar region, with neurogenic claudication 12/07/2019   Osteoporosis 11/24/2019   Lumbar herniated disc 06/09/2019   Lumbar degenerative disc disease 06/09/2019   Spinal stenosis of cervical region 05/07/2019   Lumbar spondylosis 05/07/2019   Recurrent UTI 05/07/2019   Abnormal MRI, lumbar spine 02/18/2019   Abnormal gait 02/18/2019   Chronic back pain 02/18/2019   Osteoarthritis 10/09/2018   Overactive bladder 10/09/2018   Chronic midline low back pain 04/22/2018   Chronic pain of left knee 04/22/2018   Toe fracture, left 03/10/2018   Lightheadedness 01/21/2018   PAC (premature atrial contraction) 01/19/2018   PVC's (premature ventricular contractions) 01/19/2018   Hernia, paraesophageal 12/16/2017   Kidney cysts 12/16/2017   CKD (chronic kidney disease) stage 3, GFR 30-59 ml/min (HCC) 12/16/2017   Carotid artery stenosis 12/16/2017   Chronic pain 12/16/2017   Osteopenia 12/16/2017   DDD (degenerative disc disease), cervical 12/16/2017   Labile hypertension 09/05/2017   Anxiety and depression 08/27/2017   Insomnia 08/27/2017   Syncope 08/27/2017   Bilateral occipital neuralgia 08/27/2017   Lung nodule 06/30/2017   Coronary artery disease of  native artery of native heart with stable angina pectoris ([place]) 06/03/2017   SOB (shortness of breath) 04/11/2017   Labile blood pressure 03/08/2017   Syncope, near 03/08/2017   Palpitations 03/08/2017   Orthostatic lightheadedness 03/08/2017   Essential hypertension    Overweight (BMI 25.0-29.9) 06/17/2015   S/P TKR (total knee replacement) 04/17/2015   Morbid obesity ([place]) 09/21/2014   Medication management 09/21/2014   Hypothyroidism 02/17/2014   Hyperlipidemia LDL goal <70    GERD (gastroesophageal reflux disease)    Vitamin D  deficiency    Depression     Past Surgical History:  Procedure Laterality Date   ABDOMINAL HYSTERECTOMY     BLADDER SURGERY     2003   BREAST EXCISIONAL BIOPSY Right Over 20 years    Benign   CHOLECYSTECTOMY     gastroplication      JOINT REPLACEMENT Left    KNEE   KNEE ARTHROSCOPY Left 2011   PULSE GENERATOR IMPLANT Left 01/31/2020   Procedure: PLACEMENT RIGHT FLANK PULSE GENERATOR VS REMOVAL SPINAL CORD STIMULATOR;  Surgeon: Deetta Perla, MD;  Location: [facility] ORS;  Service: Neurosurgery;  Laterality: Left;   PULSE GENERATOR IMPLANT Left 04/24/2020   Procedure: REPLACEMENT LEFT FLANK PULSE GENERATOR IMPLANT;  Surgeon: Deetta Perla, MD;  Location: [facility] ORS;  Service: Neurosurgery;  Laterality: Left;  MAC w/ local   right arm fracture     SPINAL CORD STIMULATOR REMOVAL N/A 06/26/2020   Procedure: SPINAL CORD STIMULATOR REMOVAL;  Surgeon: Deetta Perla, MD;  Location: [facility] ORS;  Service: Neurosurgery;  Laterality: N/A;   THORACIC LAMINECTOMY FOR SPINAL CORD STIMULATOR N/A 01/24/2020   Procedure: THORACIC SPINAL CORD STIMULATOR PADDLE TRIAL VIA LAMINECTOMY;  Surgeon: Deetta Perla, MD;  Location: [facility] ORS;  Service: Neurosurgery;  Laterality: N/A;   TONSILLECTOMY AND ADENOIDECTOMY     TOTAL KNEE ARTHROPLASTY Left 04/17/2015   Procedure: LEFT TOTAL KNEE ARTHROPLASTY;  Surgeon: Paralee Cancel, MD;  Location: [facility] ORS;  Service: Orthopedics;  Laterality: Left;    Prior to Admission medications   Medication Sig Start Date End Date Taking? Authorizing Provider  clotrimazole (MYCELEX) 10 MG troche Take 1 tablet (10 mg total) by mouth 5 (five) times daily. 03/18/21  Yes Ruqayyah Lute, Dannielle Karvonen, PA-C  magic mouthwash (nystatin, lidocaine, diphenhydrAMINE, alum & mag hydroxide) suspension Swish and spit 5 mLs 4 (four) times daily as needed for mouth pain. 03/18/21  Yes Ruthie Berch, Dannielle Karvonen, PA-C  acetaminophen (TYLENOL) 500 MG tablet Take 1,000 mg by mouth every 8 (eight) hours as needed for moderate  pain.    [provider]  alendronate (FOSAMAX) 70 MG tablet Take 70 mg by mouth once a week.  10/23/19   [provider]  aspirin EC 81 MG tablet Take 1 tablet (81 mg total) by mouth at bedtime. Swallow whole. 10/25/20   McLean-Scocuzza, Nino Glow, MD  atorvastatin (LIPITOR) 20 MG tablet Take 1 tablet (20 mg total) by mouth daily. 06/20/20   McLean-Scocuzza, Nino Glow, MD  Biotin 1 MG CAPS Take 1 mg by mouth daily.     [provider]  CALCIUM PO Take 1 tablet by mouth daily.     [provider]  cetirizine (ZYRTEC) 10 MG tablet Take 10 mg by mouth every morning.    [provider]  Cholecalciferol (VITAMIN D3) 125 MCG (5000 UT) TABS Take 1 tablet (5,000 Units total) by mouth daily. 10/09/18   McLean-Scocuzza, Nino Glow, MD  Cranberry 500 MG CAPS Take 500 mg by mouth  daily.    [provider]  diltiazem (CARDIZEM SR) 60 MG 12 hr capsule Take 1 capsule (60 mg total) by mouth 2 (two) times daily. 06/23/20   McLean-Scocuzza, Nino Glow, MD  escitalopram (LEXAPRO) 5 MG tablet Take 1 tablet (5 mg total) by mouth every morning. 06/23/20   McLean-Scocuzza, Nino Glow, MD  folic acid (FOLVITE) 644 MCG tablet Take 1 tablet (400 mcg total) by mouth daily. SEPARATE ALL SUPPLEMENTS TO LUNCH OR DINNER AND PRILOSEC NOT TO MESS W/THYROID MED 12/10/18   McLean-Scocuzza, Nino Glow, MD  gabapentin (NEURONTIN) 600 MG tablet Take 1 tablet (600 mg total) by mouth 3 (three) times daily. 12/28/20 03/28/21  Gillis Santa, MD  hydrALAZINE (APRESOLINE) 10 MG tablet Take 2 tablets (20 mg total) by mouth 2 (two) times daily as needed. BP>140/>90 11/02/18   McLean-Scocuzza, Nino Glow, MD  hydrochlorothiazide (MICROZIDE) 12.5 MG capsule TAKE 1 CAPSULE EVERY MORNING 09/26/20   McLean-Scocuzza, Nino Glow, MD  levothyroxine (SYNTHROID) 88 MCG tablet Take 1 tablet (88 mcg total) by mouth daily before breakfast. Skip sundays 12/02/19   McLean-Scocuzza, Nino Glow, MD  lidocaine (LIDODERM) 5 % Place 1-2 patches  onto the skin daily as needed (pain). Remove & Discard patch within 12 hours or as directed by MD    [provider]  LORazepam (ATIVAN) 0.5 MG tablet Take 1 tablet (0.5 mg total) by mouth 2 (two) times daily as needed for anxiety or sleep. Or 1 mg at night for sleep 06/23/20   McLean-Scocuzza, Nino Glow, MD  montelukast (SINGULAIR) 10 MG tablet Take 1 tablet (10 mg total) by mouth at bedtime. 02/14/20   McLean-Scocuzza, Nino Glow, MD  MYRBETRIQ 50 MG TB24 tablet TAKE ONE TABLET EVERY DAY 10/20/20   Vaillancourt, Aldona Bar, PA-C  naloxegol oxalate (MOVANTIK) 12.5 MG TABS tablet Take 1-2 tablets (12.5-25 mg total) by mouth daily. Constipation x 3 days prn 10/21/19   McLean-Scocuzza, Nino Glow, MD  omeprazole (PRILOSEC) 40 MG capsule Take 1 capsule (40 mg total) by mouth daily. After lunch 06/23/20   McLean-Scocuzza, Nino Glow, MD  polyethylene glycol powder (GLYCOLAX/MIRALAX) 17 GM/SCOOP powder Take 17 g by mouth daily as needed for moderate constipation. 10/21/19   McLean-Scocuzza, Nino Glow, MD  potassium chloride (KLOR-CON) 10 MEQ tablet Take 1 tablet (10 mEq total) by mouth daily. 06/23/20   McLean-Scocuzza, Nino Glow, MD  Probiotic Product (HEALTHY COLON PO) Take 1 capsule by mouth daily.    [provider]  sodium chloride (OCEAN) 0.65 % SOLN nasal spray Place 2 sprays into both nostrils as needed for congestion. Use 1st 11/17/18   McLean-Scocuzza, Nino Glow, MD  tiZANidine (ZANAFLEX) 4 MG tablet Take 1 tablet (4 mg total) by mouth 2 (two) times daily as needed for muscle spasms. appt further refills call office 01/03/21   McLean-Scocuzza, Nino Glow, MD  traZODone (DESYREL) 50 MG tablet TAKE 2 TABLETS EVERY DAY 1 HOUR BEFORE BED 09/11/20   McLean-Scocuzza, Nino Glow, MD  Turmeric Curcumin 500 MG CAPS Take 500 mg by mouth daily.    [provider]  Vaginal Lubricant (REPLENS) GEL Place 1 application vaginally every 3 (three) days.     [provider]  vitamin B-12 (CYANOCOBALAMIN) 1000 MCG  tablet Take 1 tablet (1,000 mcg total) by mouth daily. 10/09/18   McLean-Scocuzza, Nino Glow, MD    Allergies Patient has no known allergies.  Family History  Problem Relation Age of Onset   Heart disease Mother    Hypertension Mother  Diabetes Mother    Heart attack Mother 44   Heart disease Father    Heart attack Father 18   Breast cancer Maternal Aunt    Heart attack Brother     Social History Social History   Tobacco Use   Smoking status: Passive Smoke Exposure - Never Smoker   Smokeless tobacco: Never   Tobacco comments:    husbands and children smoked in home.   Vaping Use   Vaping Use: Never used  Substance Use Topics   Alcohol use: No   Drug use: No    Review of Systems  Constitutional: No fever/chills Eyes: No visual changes. ENT: No sore throat.  Notes oral lesions as above. Cardiovascular: Denies chest pain. Respiratory: Denies shortness of breath. Gastrointestinal: No abdominal pain.  No nausea, no vomiting.  No diarrhea.  No constipation. Genitourinary: Negative for dysuria. Musculoskeletal: Negative for back pain. Skin: Negative for rash. Neurological: Negative for headaches, focal weakness or numbness. ____________________________________________   PHYSICAL EXAM:  VITAL SIGNS: ED Triage Vitals  Enc Vitals Group     BP 03/18/21 1810 (!) 124/91     Pulse Rate 03/18/21 1810 99     Resp 03/18/21 1810 20     Temp 03/18/21 1810 99.6 F (37.6 C)     Temp Source 03/18/21 1810 Oral     SpO2 03/18/21 1810 97 %     Weight 03/18/21 1810 130 lb (59 kg)     Height 03/18/21 1810 _0  (1.626 m)     Head Circumference --      Peak Flow --      Pain Score 03/18/21 1755 8     Pain Loc --      Pain Edu? --      Excl. in [place]? --    Constitutional: Alert and oriented. Well appearing and in no acute distress. Eyes: Conjunctivae are normal. PERRL. EOMI. Head: Atraumatic. Nose: No congestion/rhinnorhea. Mouth/Throat: Mucous membranes are moist.  Oropharynx  mildly erythematous. Patient is edentulous with white, flat lesions noted to the soft palate, geographic tongue noted distally, and erythema and mucositis noted to the sublingual space. Uvula is midline and tonsils are flat. No brawny edema to the floor of the mouth Neck: No stridor.   Cardiovascular: Normal rate, regular rhythm. Grossly normal heart sounds.  Good peripheral circulation. Respiratory: Normal respiratory effort.  No retractions. Lungs CTAB. Gastrointestinal: Soft and nontender. No distention. No abdominal bruits. No CVA tenderness. Musculoskeletal: No lower extremity tenderness nor edema.  No joint effusions. Neurologic:  Normal speech and language. No gross focal neurologic deficits are appreciated.  Skin:  Skin is warm, dry and intact. No rash noted. Psychiatric: Mood and affect are normal. Speech and behavior are normal.  ____________________________________________   LABS (all labs ordered are listed, but only abnormal results are displayed)  Labs Reviewed  BASIC METABOLIC PANEL - Abnormal; Notable for the following components:      Result Value   Sodium 134 (*)    Potassium 3.4 (*)    Glucose, Bld 107 (*)    Calcium 8.4 (*)    All other components within normal limits  CBC WITH DIFFERENTIAL/PLATELET   ____________________________________________  EKG   ____________________________________________  RADIOLOGY I, Melvenia Needles, personally viewed and evaluated these images (plain radiographs) as part of my medical decision making, as well as reviewing the written report by the radiologist.  ED MD interpretation:    Official radiology report(s): No results found.  ____________________________________________   PROCEDURES  Procedure(s) performed (including Critical Care):  Procedures  Clotrimazole troche  1 PO Viscous lido 2% gargle PO challenge - apple juice ____________________________________________   INITIAL IMPRESSION / ASSESSMENT  AND PLAN / ED COURSE  As part of my medical decision making, I reviewed the following data within the [place] reviewed WNL and Notes from prior ED visits    DDX: angular cheilitis, thrush, denture stomatitis, aphthous ulcers  Geriatric patient with ED evaluation of several days of developing oral lesions, preventing normal oral intake. She was using a mouth rinse at the facility. She is found to be stable without evidence of malignant lesions, dehydration, or serious infectious process. She has lesions concerning for stomatitis and yeast candidiasis. She will be treated with clotrimazole troches and a magic mouthwash solution. She is encouraged to drink fluids to prevent dehydration. Return precautions are reviewed.   ____________________________________________   FINAL CLINICAL IMPRESSION(S) / ED DIAGNOSES  Final diagnoses:  Stomatitis and mucositis     ED Discharge Orders          Ordered    magic mouthwash (nystatin, lidocaine, diphenhydrAMINE, alum & mag hydroxide) suspension  4 times daily PRN       Note to Pharmacy: Mix equal parts   03/18/21 2041    clotrimazole (MYCELEX) 10 MG troche  5 times daily        03/18/21 2041             Note:  This document was prepared using Dragon voice recognition software and may include unintentional dictation errors.    Melvenia Needles, PA-C 03/19/21 1121    Rada Hay, MD 03/19/21 1309

## 2021-03-18 NOTE — ED Notes (Signed)
Pt NAD, a/ox4, transport at bedside. Report called to tammy at [facility]. Report fggiven to transport.

## 2021-03-18 NOTE — ED Triage Notes (Addendum)
Pt comes via EMS from [facility] with c/o mouth sores for about 5 days. Staff report to EMS that pt unable to swallow pills.  VSS  EMS reports staff have been giving pt oral mouth rinse.

## 2021-03-18 NOTE — Discharge Instructions (Addendum)
Use the Magic mouthwash solution to gargle and spit 3-4 times daily as needed.  Use the clotrimazole lozenges 5 times daily for oral care.  Follow with your primary provider return to the ED if needed.

## 2021-03-22 DIAGNOSIS — K1230 Oral mucositis (ulcerative), unspecified: Secondary | ICD-10-CM | POA: Diagnosis not present

## 2021-03-28 DIAGNOSIS — N1831 Chronic kidney disease, stage 3a: Secondary | ICD-10-CM | POA: Diagnosis not present

## 2021-03-28 DIAGNOSIS — I10 Essential (primary) hypertension: Secondary | ICD-10-CM | POA: Diagnosis not present

## 2021-03-28 DIAGNOSIS — F330 Major depressive disorder, recurrent, mild: Secondary | ICD-10-CM | POA: Diagnosis not present

## 2021-03-30 DIAGNOSIS — M419 Scoliosis, unspecified: Secondary | ICD-10-CM | POA: Diagnosis not present

## 2021-03-30 DIAGNOSIS — M6281 Muscle weakness (generalized): Secondary | ICD-10-CM | POA: Diagnosis not present

## 2021-03-30 DIAGNOSIS — R2681 Unsteadiness on feet: Secondary | ICD-10-CM | POA: Diagnosis not present

## 2021-03-31 ENCOUNTER — Emergency Department: Payer: Medicare HMO

## 2021-03-31 ENCOUNTER — Encounter: Payer: Self-pay | Admitting: Emergency Medicine

## 2021-03-31 ENCOUNTER — Inpatient Hospital Stay
Admission: EM | Admit: 2021-03-31 | Discharge: 2021-04-04 | DRG: 689 | Disposition: A | Discharge status: Nursing Home (Includes ICF) | Payer: Medicare HMO | Source: Skilled Nursing Facility | Attending: Student | Admitting: Student

## 2021-03-31 ENCOUNTER — Other Ambulatory Visit: Payer: Self-pay

## 2021-03-31 DIAGNOSIS — Z79890 Hormone replacement therapy (postmenopausal): Secondary | ICD-10-CM | POA: Diagnosis not present

## 2021-03-31 DIAGNOSIS — B961 Klebsiella pneumoniae [K. pneumoniae] as the cause of diseases classified elsewhere: Secondary | ICD-10-CM | POA: Diagnosis present

## 2021-03-31 DIAGNOSIS — R0689 Other abnormalities of breathing: Secondary | ICD-10-CM | POA: Diagnosis not present

## 2021-03-31 DIAGNOSIS — Z7982 Long term (current) use of aspirin: Secondary | ICD-10-CM | POA: Diagnosis not present

## 2021-03-31 DIAGNOSIS — I129 Hypertensive chronic kidney disease with stage 1 through stage 4 chronic kidney disease, or unspecified chronic kidney disease: Secondary | ICD-10-CM | POA: Diagnosis present

## 2021-03-31 DIAGNOSIS — Z8249 Family history of ischemic heart disease and other diseases of the circulatory system: Secondary | ICD-10-CM

## 2021-03-31 DIAGNOSIS — E440 Moderate protein-calorie malnutrition: Secondary | ICD-10-CM | POA: Diagnosis not present

## 2021-03-31 DIAGNOSIS — R410 Disorientation, unspecified: Secondary | ICD-10-CM | POA: Diagnosis not present

## 2021-03-31 DIAGNOSIS — Z90710 Acquired absence of both cervix and uterus: Secondary | ICD-10-CM | POA: Diagnosis not present

## 2021-03-31 DIAGNOSIS — E878 Other disorders of electrolyte and fluid balance, not elsewhere classified: Secondary | ICD-10-CM | POA: Diagnosis present

## 2021-03-31 DIAGNOSIS — E871 Hypo-osmolality and hyponatremia: Secondary | ICD-10-CM

## 2021-03-31 DIAGNOSIS — Z20822 Contact with and (suspected) exposure to covid-19: Secondary | ICD-10-CM | POA: Diagnosis not present

## 2021-03-31 DIAGNOSIS — M5481 Occipital neuralgia: Secondary | ICD-10-CM | POA: Diagnosis present

## 2021-03-31 DIAGNOSIS — Z6823 Body mass index (BMI) 23.0-23.9, adult: Secondary | ICD-10-CM

## 2021-03-31 DIAGNOSIS — Z96652 Presence of left artificial knee joint: Secondary | ICD-10-CM | POA: Diagnosis present

## 2021-03-31 DIAGNOSIS — G9341 Metabolic encephalopathy: Secondary | ICD-10-CM | POA: Diagnosis not present

## 2021-03-31 DIAGNOSIS — R531 Weakness: Secondary | ICD-10-CM | POA: Diagnosis not present

## 2021-03-31 DIAGNOSIS — E039 Hypothyroidism, unspecified: Secondary | ICD-10-CM | POA: Diagnosis not present

## 2021-03-31 DIAGNOSIS — N182 Chronic kidney disease, stage 2 (mild): Secondary | ICD-10-CM | POA: Diagnosis present

## 2021-03-31 DIAGNOSIS — I10 Essential (primary) hypertension: Secondary | ICD-10-CM

## 2021-03-31 DIAGNOSIS — E785 Hyperlipidemia, unspecified: Secondary | ICD-10-CM | POA: Diagnosis not present

## 2021-03-31 DIAGNOSIS — A419 Sepsis, unspecified organism: Secondary | ICD-10-CM | POA: Diagnosis not present

## 2021-03-31 DIAGNOSIS — K219 Gastro-esophageal reflux disease without esophagitis: Secondary | ICD-10-CM | POA: Diagnosis present

## 2021-03-31 DIAGNOSIS — R3915 Urgency of urination: Secondary | ICD-10-CM | POA: Diagnosis present

## 2021-03-31 DIAGNOSIS — K5900 Constipation, unspecified: Secondary | ICD-10-CM | POA: Diagnosis not present

## 2021-03-31 DIAGNOSIS — N390 Urinary tract infection, site not specified: Principal | ICD-10-CM

## 2021-03-31 DIAGNOSIS — Z79899 Other long term (current) drug therapy: Secondary | ICD-10-CM | POA: Diagnosis not present

## 2021-03-31 DIAGNOSIS — M419 Scoliosis, unspecified: Secondary | ICD-10-CM | POA: Diagnosis not present

## 2021-03-31 DIAGNOSIS — Z9889 Other specified postprocedural states: Secondary | ICD-10-CM | POA: Diagnosis not present

## 2021-03-31 DIAGNOSIS — E559 Vitamin D deficiency, unspecified: Secondary | ICD-10-CM | POA: Diagnosis present

## 2021-03-31 DIAGNOSIS — Z66 Do not resuscitate: Secondary | ICD-10-CM | POA: Diagnosis present

## 2021-03-31 DIAGNOSIS — I2510 Atherosclerotic heart disease of native coronary artery without angina pectoris: Secondary | ICD-10-CM | POA: Diagnosis not present

## 2021-03-31 DIAGNOSIS — F419 Anxiety disorder, unspecified: Secondary | ICD-10-CM

## 2021-03-31 DIAGNOSIS — R918 Other nonspecific abnormal finding of lung field: Secondary | ICD-10-CM | POA: Diagnosis not present

## 2021-03-31 DIAGNOSIS — R404 Transient alteration of awareness: Secondary | ICD-10-CM | POA: Diagnosis not present

## 2021-03-31 DIAGNOSIS — Z8673 Personal history of transient ischemic attack (TIA), and cerebral infarction without residual deficits: Secondary | ICD-10-CM

## 2021-03-31 DIAGNOSIS — R4182 Altered mental status, unspecified: Secondary | ICD-10-CM | POA: Diagnosis not present

## 2021-03-31 DIAGNOSIS — Z833 Family history of diabetes mellitus: Secondary | ICD-10-CM

## 2021-03-31 DIAGNOSIS — Z981 Arthrodesis status: Secondary | ICD-10-CM | POA: Diagnosis not present

## 2021-03-31 DIAGNOSIS — G4700 Insomnia, unspecified: Secondary | ICD-10-CM

## 2021-03-31 DIAGNOSIS — R000 Tachycardia, unspecified: Secondary | ICD-10-CM | POA: Diagnosis not present

## 2021-03-31 DIAGNOSIS — Z803 Family history of malignant neoplasm of breast: Secondary | ICD-10-CM | POA: Diagnosis not present

## 2021-03-31 DIAGNOSIS — Z7983 Long term (current) use of bisphosphonates: Secondary | ICD-10-CM

## 2021-03-31 LAB — URINALYSIS, COMPLETE (UACMP) WITH MICROSCOPIC
Bilirubin Urine: NEGATIVE
Glucose, UA: NEGATIVE mg/dL
Ketones, ur: 5 mg/dL — AB
Nitrite: NEGATIVE
Protein, ur: 100 mg/dL — AB
RBC / HPF: >50 RBC/hpf — ABNORMAL HIGH (ref 0–5)
Specific Gravity, Urine: 1.016 (ref 1.005–1.030)
Squamous Epithelial / HPF: NONE SEEN (ref 0–5)
pH: 6 (ref 5.0–8.0)

## 2021-03-31 LAB — CBC WITH DIFFERENTIAL/PLATELET
Abs Immature Granulocytes: 0.05 10*3/uL (ref 0.00–0.07)
Basophils Absolute: 0 10*3/uL (ref 0.0–0.1)
Basophils Relative: 0 %
Eosinophils Absolute: 0.1 10*3/uL (ref 0.0–0.5)
Eosinophils Relative: 1 %
HCT: 41.8 % (ref 36.0–46.0)
Hemoglobin: 15 g/dL (ref 12.0–15.0)
Immature Granulocytes: 1 %
Lymphocytes Relative: 11 %
Lymphs Abs: 0.9 10*3/uL (ref 0.7–4.0)
MCH: 32 pg (ref 26.0–34.0)
MCHC: 35.9 g/dL (ref 30.0–36.0)
MCV: 89.1 fL (ref 80.0–100.0)
Monocytes Absolute: 0.5 10*3/uL (ref 0.1–1.0)
Monocytes Relative: 6 %
Neutro Abs: 6.8 10*3/uL (ref 1.7–7.7)
Neutrophils Relative %: 81 %
Platelets: 344 10*3/uL (ref 150–400)
RBC: 4.69 MIL/uL (ref 3.87–5.11)
RDW: 13 % (ref 11.5–15.5)
WBC: 8.4 10*3/uL (ref 4.0–10.5)
nRBC: 0 % (ref 0.0–0.2)

## 2021-03-31 LAB — COMPREHENSIVE METABOLIC PANEL
ALT: 19 U/L (ref 0–44)
AST: 32 U/L (ref 15–41)
Albumin: 3.1 g/dL — ABNORMAL LOW (ref 3.5–5.0)
Alkaline Phosphatase: 102 U/L (ref 38–126)
Anion gap: 14 (ref 5–15)
BUN: 13 mg/dL (ref 8–23)
CO2: 26 mmol/L (ref 22–32)
Calcium: 9 mg/dL (ref 8.9–10.3)
Chloride: 87 mmol/L — ABNORMAL LOW (ref 98–111)
Creatinine, Ser: 0.76 mg/dL (ref 0.44–1.00)
GFR, Estimated: >60 mL/min (ref >60)
Glucose, Bld: 130 mg/dL — ABNORMAL HIGH (ref 70–99)
Potassium: 3.3 mmol/L — ABNORMAL LOW (ref 3.5–5.1)
Sodium: 127 mmol/L — ABNORMAL LOW (ref 135–145)
Total Bilirubin: 1.2 mg/dL (ref 0.3–1.2)
Total Protein: 7.3 g/dL (ref 6.5–8.1)

## 2021-03-31 LAB — PROTIME-INR
INR: 1 (ref 0.8–1.2)
Prothrombin Time: 12.7 seconds (ref 11.4–15.2)

## 2021-03-31 LAB — APTT: aPTT: 28 seconds (ref 24–36)

## 2021-03-31 LAB — RESP PANEL BY RT-PCR (FLU A&B, COVID) ARPGX2
Influenza A by PCR: NEGATIVE
Influenza B by PCR: NEGATIVE
SARS Coronavirus 2 by RT PCR: NEGATIVE

## 2021-03-31 LAB — LACTIC ACID, PLASMA: Lactic Acid, Venous: 0.9 mmol/L (ref 0.5–1.9)

## 2021-03-31 MED ORDER — CALCIUM CARBONATE 1250 (500 CA) MG PO TABS
1.0000 | ORAL_TABLET | Freq: Every day | ORAL | Status: DC
  Administered 2021-04-01 – 2021-04-04 (×4): 500 mg via ORAL
  Filled 2021-03-31 (×4): qty 1

## 2021-03-31 MED ORDER — ONDANSETRON HCL 4 MG PO TABS: 4.0000 mg | ORAL_TABLET | Freq: Four times a day (QID) | ORAL | Status: DC | PRN

## 2021-03-31 MED ORDER — SODIUM CHLORIDE 0.9 % IV SOLN
1.0000 g | INTRAVENOUS | Status: DC
  Administered 2021-04-01 – 2021-04-02 (×2): 1 g via INTRAVENOUS
  Filled 2021-03-31: qty 10
  Filled 2021-03-31 (×2): qty 1

## 2021-03-31 MED ORDER — LACTATED RINGERS IV SOLN: INTRAVENOUS | Status: AC

## 2021-03-31 MED ORDER — VITAMIN D 25 MCG (1000 UNIT) PO TABS
5000.0000 [IU] | ORAL_TABLET | Freq: Every day | ORAL | Status: DC
  Administered 2021-04-01 – 2021-04-04 (×4): 5000 [IU] via ORAL
  Filled 2021-03-31 (×4): qty 5

## 2021-03-31 MED ORDER — ASPIRIN EC 81 MG PO TBEC
81.0000 mg | DELAYED_RELEASE_TABLET | Freq: Every day | ORAL | Status: DC
  Administered 2021-03-31 – 2021-04-03 (×4): 81 mg via ORAL
  Filled 2021-03-31 (×4): qty 1

## 2021-03-31 MED ORDER — HYDRALAZINE HCL 10 MG PO TABS
20.0000 mg | ORAL_TABLET | Freq: Two times a day (BID) | ORAL | Status: DC | PRN
  Filled 2021-03-31: qty 2

## 2021-03-31 MED ORDER — GABAPENTIN 600 MG PO TABS
600.0000 mg | ORAL_TABLET | Freq: Three times a day (TID) | ORAL | Status: DC
Start: 2021-03-31 — End: 2021-04-04
  Administered 2021-03-31 – 2021-04-04 (×10): 600 mg via ORAL
  Filled 2021-03-31 (×11): qty 1

## 2021-03-31 MED ORDER — MONTELUKAST SODIUM 10 MG PO TABS
10.0000 mg | ORAL_TABLET | Freq: Every day | ORAL | Status: DC
  Administered 2021-03-31 – 2021-04-03 (×4): 10 mg via ORAL
  Filled 2021-03-31 (×4): qty 1

## 2021-03-31 MED ORDER — ENOXAPARIN SODIUM 40 MG/0.4ML IJ SOSY
40.0000 mg | PREFILLED_SYRINGE | Freq: Every day | INTRAMUSCULAR | Status: DC
  Administered 2021-03-31 – 2021-04-03 (×4): 40 mg via SUBCUTANEOUS
  Filled 2021-03-31 (×4): qty 0.4

## 2021-03-31 MED ORDER — SODIUM CHLORIDE 0.9 % IV BOLUS (SEPSIS)
1000.0000 mL | Freq: Once | INTRAVENOUS | Status: AC
  Administered 2021-03-31: 1000 mL via INTRAVENOUS

## 2021-03-31 MED ORDER — ACETAMINOPHEN 325 MG PO TABS
650.0000 mg | ORAL_TABLET | Freq: Four times a day (QID) | ORAL | Status: DC | PRN
  Administered 2021-04-03: 650 mg via ORAL
  Filled 2021-03-31: qty 2

## 2021-03-31 MED ORDER — PANTOPRAZOLE SODIUM 40 MG PO TBEC
40.0000 mg | DELAYED_RELEASE_TABLET | Freq: Every day | ORAL | Status: DC
  Administered 2021-04-01 – 2021-04-04 (×4): 40 mg via ORAL
  Filled 2021-03-31 (×4): qty 1

## 2021-03-31 MED ORDER — CRANBERRY 500 MG PO CAPS: 500.0000 mg | ORAL_CAPSULE | Freq: Every day | ORAL | Status: DC

## 2021-03-31 MED ORDER — MIRABEGRON ER 50 MG PO TB24
50.0000 mg | ORAL_TABLET | Freq: Every day | ORAL | Status: DC
  Administered 2021-04-01 – 2021-04-04 (×4): 50 mg via ORAL
  Filled 2021-03-31 (×4): qty 1

## 2021-03-31 MED ORDER — ESCITALOPRAM OXALATE 10 MG PO TABS
5.0000 mg | ORAL_TABLET | ORAL | Status: DC
  Administered 2021-04-01 – 2021-04-04 (×4): 5 mg via ORAL
  Filled 2021-03-31 (×4): qty 0.5

## 2021-03-31 MED ORDER — NYSTATIN 100000 UNIT/ML MT SUSP: 5.0000 mL | Freq: Four times a day (QID) | OROMUCOSAL | Status: DC | PRN

## 2021-03-31 MED ORDER — POTASSIUM CHLORIDE CRYS ER 20 MEQ PO TBCR
10.0000 meq | EXTENDED_RELEASE_TABLET | Freq: Every day | ORAL | Status: DC
  Administered 2021-03-31 – 2021-04-01 (×2): 10 meq via ORAL
  Filled 2021-03-31 (×2): qty 1

## 2021-03-31 MED ORDER — MAGIC MOUTHWASH W/LIDOCAINE
5.0000 mL | Freq: Four times a day (QID) | ORAL | Status: DC | PRN
  Filled 2021-03-31: qty 5

## 2021-03-31 MED ORDER — LORATADINE 10 MG PO TABS
10.0000 mg | ORAL_TABLET | Freq: Every day | ORAL | Status: DC
  Administered 2021-04-01 – 2021-04-04 (×4): 10 mg via ORAL
  Filled 2021-03-31 (×4): qty 1

## 2021-03-31 MED ORDER — FOLIC ACID 1 MG PO TABS
500.0000 ug | ORAL_TABLET | Freq: Every day | ORAL | Status: DC
  Administered 2021-04-01 – 2021-04-04 (×4): 0.5 mg via ORAL
  Filled 2021-03-31 (×4): qty 1

## 2021-03-31 MED ORDER — REPLENS VA GEL: 1.0000 "application " | VAGINAL | Status: DC

## 2021-03-31 MED ORDER — LIDOCAINE 5 % EX PTCH
1.0000 | MEDICATED_PATCH | Freq: Every day | CUTANEOUS | Status: DC | PRN
  Filled 2021-03-31: qty 2

## 2021-03-31 MED ORDER — TRAZODONE HCL 50 MG PO TABS: 25.0000 mg | ORAL_TABLET | Freq: Every evening | ORAL | Status: DC | PRN

## 2021-03-31 MED ORDER — POLYETHYLENE GLYCOL 3350 17 G PO PACK: 17.0000 g | PACK | Freq: Every day | ORAL | Status: DC | PRN

## 2021-03-31 MED ORDER — BIOTIN 1 MG PO CAPS: 1.0000 mg | ORAL_CAPSULE | Freq: Every day | ORAL | Status: DC

## 2021-03-31 MED ORDER — DILTIAZEM HCL ER 60 MG PO CP12
60.0000 mg | ORAL_CAPSULE | Freq: Two times a day (BID) | ORAL | Status: DC
  Administered 2021-04-01 – 2021-04-04 (×5): 60 mg via ORAL
  Filled 2021-03-31 (×8): qty 1

## 2021-03-31 MED ORDER — LORAZEPAM 0.5 MG PO TABS: 0.5000 mg | ORAL_TABLET | Freq: Two times a day (BID) | ORAL | Status: DC | PRN

## 2021-03-31 MED ORDER — ALENDRONATE SODIUM 70 MG PO TABS: 70.0000 mg | ORAL_TABLET | ORAL | Status: DC

## 2021-03-31 MED ORDER — SODIUM CHLORIDE 0.9 % IV SOLN
1.0000 g | Freq: Once | INTRAVENOUS | Status: AC
  Administered 2021-03-31: 1 g via INTRAVENOUS
  Filled 2021-03-31: qty 10

## 2021-03-31 MED ORDER — TURMERIC CURCUMIN 500 MG PO CAPS: 500.0000 mg | ORAL_CAPSULE | Freq: Every day | ORAL | Status: DC

## 2021-03-31 MED ORDER — CLOTRIMAZOLE 10 MG MT TROC: 10.0000 mg | Freq: Every day | OROMUCOSAL | Status: DC

## 2021-03-31 MED ORDER — POTASSIUM CHLORIDE IN NACL 20-0.9 MEQ/L-% IV SOLN
INTRAVENOUS | Status: DC
  Filled 2021-03-31 (×7): qty 1000

## 2021-03-31 MED ORDER — MAGNESIUM HYDROXIDE 400 MG/5ML PO SUSP: 30.0000 mL | Freq: Every day | ORAL | Status: DC | PRN

## 2021-03-31 MED ORDER — TRAZODONE HCL 100 MG PO TABS
100.0000 mg | ORAL_TABLET | Freq: Every day | ORAL | Status: DC
  Administered 2021-03-31 – 2021-04-03 (×4): 100 mg via ORAL
  Filled 2021-03-31 (×3): qty 1
  Filled 2021-03-31: qty 2

## 2021-03-31 MED ORDER — SALINE SPRAY 0.65 % NA SOLN
2.0000 | NASAL | Status: DC | PRN
  Filled 2021-03-31: qty 44

## 2021-03-31 MED ORDER — VITAMIN B-12 1000 MCG PO TABS
1000.0000 ug | ORAL_TABLET | Freq: Every day | ORAL | Status: DC
  Administered 2021-04-01 – 2021-04-04 (×4): 1000 ug via ORAL
  Filled 2021-03-31 (×4): qty 1

## 2021-03-31 MED ORDER — LEVOTHYROXINE SODIUM 88 MCG PO TABS
88.0000 ug | ORAL_TABLET | ORAL | Status: DC
  Administered 2021-04-02 – 2021-04-04 (×3): 88 ug via ORAL
  Filled 2021-03-31 (×4): qty 1

## 2021-03-31 MED ORDER — ONDANSETRON HCL 4 MG/2ML IJ SOLN: 4.0000 mg | Freq: Four times a day (QID) | INTRAMUSCULAR | Status: DC | PRN

## 2021-03-31 MED ORDER — ACETAMINOPHEN 650 MG RE SUPP: 650.0000 mg | Freq: Four times a day (QID) | RECTAL | Status: DC | PRN

## 2021-03-31 MED ORDER — ATORVASTATIN CALCIUM 20 MG PO TABS
20.0000 mg | ORAL_TABLET | Freq: Every day | ORAL | Status: DC
  Administered 2021-03-31 – 2021-04-04 (×5): 20 mg via ORAL
  Filled 2021-03-31 (×5): qty 1

## 2021-03-31 MED ORDER — TIZANIDINE HCL 4 MG PO TABS
4.0000 mg | ORAL_TABLET | Freq: Two times a day (BID) | ORAL | Status: DC | PRN
  Filled 2021-03-31: qty 1

## 2021-03-31 NOTE — H&P (Addendum)
PATIENT NAME: Kimberly Cook    MR#:  [id number]  DATE OF BIRTH:  06-Feb-1946  DATE OF ADMISSION:  03/31/2021  PRIMARY CARE PHYSICIAN: Virgie Dad, FNP   Patient is coming from: [facility]    REQUESTING/REFERRING PHYSICIAN: Harvest Dark, MD  CHIEF COMPLAINT:   Chief Complaint  Patient presents with  . Altered Mental Status    HISTORY OF PRESENT ILLNESS:  Kimberly Cook is a 75 y.o. Caucasian female with medical history significant for coronary artery disease, hypertension, CVA, dyslipidemia, hypothyroidism and stage III chronic kidney disease, and anxiety and depression, who presented to the emergency room with acute onset of altered mental status with confusion as well as generalized weakness at her assisted living facility.  The patient admits to urinary frequency and urgency without dysuria or hematuria or flank pain.  No chest pain or dyspnea or palpitations.  No cough or wheezing.  She denies any headache or dizziness or blurred vision.  No paresthesias or focal muscle weakness.  ED Course: Upon presentation to the emergency room BP was 96/79 with heart rate of 111 with otherwise normal vital signs.  Labs revealed hyponatremia 127 and hypochloremia of 87 with albumin of 3.1.  Lactic acid was 0.9 and CBC was within normal.  UA was positive for UTI. EKG as reviewed by me : Showed sinus tachycardia with rate of 115 with poor R wave progression and Q waves inferiorly. Imaging: Chest x-ray showed the following: 1. No evidence of pneumonia. 2. Nodular density over the right apex may be associated with the nearby EKG lead. Recommend a PA and lateral chest x-ray without EKG leads before discharge.  The patient was given 1 L bolus of IV normal saline, 1 g of IV Rocephin and was placed on IV lactated Ringer 150 mill per hour.  She will be admitted to a medical bed for further evaluation and management. PAST MEDICAL HISTORY:   Past Medical History:   Diagnosis Date  . Allergy   . Anxiety   . Arthritis   . Back pain   . Chronic kidney disease    STAGE 3  . Coronary artery disease 04/2017   Mild to moderate CAD in LAD/diagonal by CTA (CT-FFR of apical LAD 0.79).  . DDD (degenerative disc disease), lumbar   . Depression   . Diverticulosis   . Essential hypertension    Normal cardiolite 05/2006 EF 71%  . GERD (gastroesophageal reflux disease)   . Headache   . History of shingles   . Hyperlipidemia   . Hypothyroidism   . Lung nodule   . Mini stroke ([place]) 2011   . Occipital neuralgia   . Palpitations   . Pneumonia 2018  . Prediabetes   . Stroke ([place])   . Stroke [facility])    MRI 04/2008 + left sup. frontal gyrus possibly puntate infarct   . Syncope 2019  . Urinary tract infection   . Vitamin D deficiency     PAST SURGICAL HISTORY:   Past Surgical History:  Procedure Laterality Date  . ABDOMINAL HYSTERECTOMY    . BLADDER SURGERY     2003  . BREAST EXCISIONAL BIOPSY Right Over 20 years    Benign  . CHOLECYSTECTOMY    . gastroplication     . JOINT REPLACEMENT Left    KNEE  . KNEE ARTHROSCOPY Left 2011  . PULSE GENERATOR IMPLANT Left 01/31/2020   Procedure: PLACEMENT RIGHT FLANK PULSE GENERATOR VS REMOVAL  SPINAL CORD STIMULATOR;  Surgeon: Deetta Perla, MD;  Location: [facility];  Service: Neurosurgery;  Laterality: Left;  . PULSE GENERATOR IMPLANT Left 04/24/2020   Procedure: REPLACEMENT LEFT FLANK PULSE GENERATOR IMPLANT;  Surgeon: Deetta Perla, MD;  Location: [facility] OR[facility];  Service: Neurosurgery;  Laterality: Left;  MAC w/ local  . right arm fracture    . SPINAL CORD STIMULATOR REMOVAL N/A 06/26/2020   Procedure: SPINAL CORD STIMULATOR REMOVAL;  Surgeon: Deetta Perla, MD;  Location: [facility] OR[facility];  Service: Neurosurgery;  Laterality: N/A;  . THORACIC LAMINECTOMY FOR SPINAL CORD STIMULATOR N/A 01/24/2020   Procedure: THORACIC SPINAL CORD STIMULATOR PADDLE TRIAL VIA LAMINECTOMY;  Surgeon: Deetta Perla, MD;  Location: [facility];   Service: Neurosurgery;  Laterality: N/A;  . TONSILLECTOMY AND ADENOIDECTOMY    . TOTAL KNEE ARTHROPLASTY Left 04/17/2015   Procedure: LEFT TOTAL KNEE ARTHROPLASTY;  Surgeon: Paralee Cancel, MD;  Location: [facility];  Service: Orthopedics;  Laterality: Left;    SOCIAL HISTORY:   Social History   Tobacco Use  . Smoking status: Passive Smoke Exposure - Never Smoker  . Smokeless tobacco: Never  . Tobacco comments:    husbands and children smoked in home.   Substance Use Topics  . Alcohol use: No    FAMILY HISTORY:   Family History  Problem Relation Age of Onset  . Heart disease Mother   . Hypertension Mother   . Diabetes Mother   . Heart attack Mother 56  . Heart disease Father   . Heart attack Father 51  . Breast cancer Maternal Aunt   . Heart attack Brother     DRUG ALLERGIES:  No Known Allergies  REVIEW OF SYSTEMS:   ROS As per history of present illness. All pertinent systems were reviewed above. Constitutional, HEENT, cardiovascular, respiratory, GI, GU, musculoskeletal, neuro, psychiatric, endocrine, integumentary and hematologic systems were reviewed and are otherwise negative/unremarkable except for positive findings mentioned above in the HPI.   MEDICATIONS AT HOME:   Prior to Admission medications   Medication Sig Start Date End Date Taking? Authorizing Provider  acetaminophen (TYLENOL) 500 MG tablet Take 1,000 mg by mouth every 8 (eight) hours as needed for moderate pain.    [provider]  alendronate (FOSAMAX) 70 MG tablet Take 70 mg by mouth once a week.  10/23/19   [provider]  aspirin EC 81 MG tablet Take 1 tablet (81 mg total) by mouth at bedtime. Swallow whole. 10/25/20   McLean-Scocuzza, Nino Glow, MD  atorvastatin (LIPITOR) 20 MG tablet Take 1 tablet (20 mg total) by mouth daily. 06/20/20   McLean-Scocuzza, Nino Glow, MD  Biotin 1 MG CAPS Take 1 mg by mouth daily.     [provider]  CALCIUM PO Take 1 tablet by mouth daily.      [provider]  cetirizine (ZYRTEC) 10 MG tablet Take 10 mg by mouth every morning.    [provider]  Cholecalciferol (VITAMIN D3) 125 MCG (5000 UT) TABS Take 1 tablet (5,000 Units total) by mouth daily. 10/09/18   McLean-Scocuzza, Nino Glow, MD  clotrimazole (MYCELEX) 10 MG troche Take 1 tablet (10 mg total) by mouth 5 (five) times daily. 03/18/21   Menshew, Dannielle Karvonen, PA-C  Cranberry 500 MG CAPS Take 500 mg by mouth daily.    [provider]  diltiazem (CARDIZEM SR) 60 MG 12 hr capsule Take 1 capsule (60 mg total) by mouth 2 (two) times daily. 06/23/20   McLean-Scocuzza, Nino Glow, MD  escitalopram (LEXAPRO) 5 MG tablet Take 1 tablet (5 mg total) by mouth every morning. 06/23/20   McLean-Scocuzza, Nino Glow, MD  folic acid (FOLVITE) 751 MCG tablet Take 1 tablet (400 mcg total) by mouth daily. SEPARATE ALL SUPPLEMENTS TO LUNCH OR DINNER AND PRILOSEC NOT TO MESS W/THYROID MED 12/10/18   McLean-Scocuzza, Nino Glow, MD  gabapentin (NEURONTIN) 600 MG tablet Take 1 tablet (600 mg total) by mouth 3 (three) times daily. 12/28/20 03/28/21  Gillis Santa, MD  hydrALAZINE (APRESOLINE) 10 MG tablet Take 2 tablets (20 mg total) by mouth 2 (two) times daily as needed. BP>140/>90 11/02/18   McLean-Scocuzza, Nino Glow, MD  hydrochlorothiazide (MICROZIDE) 12.5 MG capsule TAKE 1 CAPSULE EVERY MORNING 09/26/20   McLean-Scocuzza, Nino Glow, MD  levothyroxine (SYNTHROID) 88 MCG tablet Take 1 tablet (88 mcg total) by mouth daily before breakfast. Skip sundays 12/02/19   McLean-Scocuzza, Nino Glow, MD  lidocaine (LIDODERM) 5 % Place 1-2 patches onto the skin daily as needed (pain). Remove & Discard patch within 12 hours or as directed by MD    [provider]  LORazepam (ATIVAN) 0.5 MG tablet Take 1 tablet (0.5 mg total) by mouth 2 (two) times daily as needed for anxiety or sleep. Or 1 mg at night for sleep 06/23/20   McLean-Scocuzza, Nino Glow, MD  magic mouthwash (nystatin, lidocaine, diphenhydrAMINE,  alum & mag hydroxide) suspension Swish and spit 5 mLs 4 (four) times daily as needed for mouth pain. 03/18/21   Menshew, Dannielle Karvonen, PA-C  montelukast (SINGULAIR) 10 MG tablet Take 1 tablet (10 mg total) by mouth at bedtime. 02/14/20   McLean-Scocuzza, Nino Glow, MD  MYRBETRIQ 50 MG TB24 tablet TAKE ONE TABLET EVERY DAY 10/20/20   Vaillancourt, Aldona Bar, PA-C  naloxegol oxalate (MOVANTIK) 12.5 MG TABS tablet Take 1-2 tablets (12.5-25 mg total) by mouth daily. Constipation x 3 days prn 10/21/19   McLean-Scocuzza, Nino Glow, MD  omeprazole (PRILOSEC) 40 MG capsule Take 1 capsule (40 mg total) by mouth daily. After lunch 06/23/20   McLean-Scocuzza, Nino Glow, MD  polyethylene glycol powder (GLYCOLAX/MIRALAX) 17 GM/SCOOP powder Take 17 g by mouth daily as needed for moderate constipation. 10/21/19   McLean-Scocuzza, Nino Glow, MD  potassium chloride (KLOR-CON) 10 MEQ tablet Take 1 tablet (10 mEq total) by mouth daily. 06/23/20   McLean-Scocuzza, Nino Glow, MD  Probiotic Product (HEALTHY COLON PO) Take 1 capsule by mouth daily.    [provider]  sodium chloride (OCEAN) 0.65 % SOLN nasal spray Place 2 sprays into both nostrils as needed for congestion. Use 1st 11/17/18   McLean-Scocuzza, Nino Glow, MD  tiZANidine (ZANAFLEX) 4 MG tablet Take 1 tablet (4 mg total) by mouth 2 (two) times daily as needed for muscle spasms. appt further refills call office 01/03/21   McLean-Scocuzza, Nino Glow, MD  traZODone (DESYREL) 50 MG tablet TAKE 2 TABLETS EVERY DAY 1 HOUR BEFORE BED 09/11/20   McLean-Scocuzza, Nino Glow, MD  Turmeric Curcumin 500 MG CAPS Take 500 mg by mouth daily.    [provider]  Vaginal Lubricant (REPLENS) GEL Place 1 application vaginally every 3 (three) days.     [provider]  vitamin B-12 (CYANOCOBALAMIN) 1000 MCG tablet Take 1 tablet (1,000 mcg total) by mouth daily. 10/09/18   McLean-Scocuzza, Nino Glow, MD      VITAL SIGNS:  Blood pressure 98/75, pulse 89, temperature 98.2 F (36.8  C), temperature source Oral, resp. rate 13, height _0  (1.626 m), weight 59 kg, SpO2  98 %.  PHYSICAL EXAMINATION:  Physical Exam  GENERAL:  75 y.o.-year-old Caucasian female patient lying in the bed with no acute distress.  She is currently alert and oriented x 3 except to the month and the day.   EYES: Pupils equal, round, reactive to light and accommodation. No scleral icterus. Extraocular muscles intact.  HEENT: Head atraumatic, normocephalic. Oropharynx and nasopharynx clear.  NECK:  Supple, no jugular venous distention. No thyroid enlargement, no tenderness.  LUNGS: Normal breath sounds bilaterally, no wheezing, rales,rhonchi or crepitation. No use of accessory muscles of respiration.  CARDIOVASCULAR: Regular rate and rhythm, S1, S2 normal. No murmurs, rubs, or gallops.  ABDOMEN: Soft, nondistended, nontender. Bowel sounds present. No organomegaly or mass.  EXTREMITIES: No pedal edema, cyanosis, or clubbing.  NEUROLOGIC: Cranial nerves II through XII are intact. Muscle strength 5/5 in all extremities. Sensation intact. Gait not checked.  PSYCHIATRIC: The patient is alert and oriented to her name, place and person as well as to the year but not the day or the month.  Normal affect and good eye contact. SKIN: No obvious rash, lesion, or ulcer.   LABORATORY PANEL:   CBC Recent Labs  Lab 03/31/21 1925  WBC 8.4  HGB 15.0  HCT 41.8  PLT 344   ------------------------------------------------------------------------------------------------------------------  Chemistries  Recent Labs  Lab 03/31/21 1925  NA 127*  K 3.3*  CL 87*  CO2 26  GLUCOSE 130*  BUN 13  CREATININE 0.76  CALCIUM 9.0  AST 32  ALT 19  ALKPHOS 102  BILITOT 1.2   ------------------------------------------------------------------------------------------------------------------  Cardiac Enzymes No results for input(s): TROPONINI in the last 168  hours. ------------------------------------------------------------------------------------------------------------------  RADIOLOGY:  DG Chest Port 1 View  Result Date: 03/31/2021 CLINICAL DATA:  Questionable sepsis.  Evaluate for abnormality. EXAM: PORTABLE CHEST 1 VIEW COMPARISON:  March 04, 2017 FINDINGS: No pneumothorax. Nodular density in the right apex may be associated with the nearby EKG lead. The heart, hila, and mediastinum are unremarkable. The lungs are otherwise clear. Pedicle rods and screws seen in the thoracic and lumbar spine. IMPRESSION: 1. No evidence of pneumonia. 2. Nodular density over the right apex may be associated with the nearby EKG lead. Recommend a PA and lateral chest x-ray without EKG leads before discharge. Electronically Signed   By: Dorise Bullion III M.D.   On: 03/31/2021 18:58      IMPRESSION AND PLAN:  Active Problems:   Metabolic encephalopathy  1.  Acute metabolic cephalopathy likely multifactorial due to UTI and hyponatremia. - The patient be admitted to a medical bed. - We will continue hydration with IV normal saline. - We will restrict p.o. fluids. - We will follow sodium levels. - Neurochecks will be followed. - She will be placed on IV Rocephin for her UTI. - We will follow urine culture and sensitivity.  2.  Essential hypertension. - We will continue Cardizem CD, Cozaar, hydralazine and hold off HCTZ.  3.  Hypothyroidism. - We will continue Synthroid.  4.  Bilateral occipital neuralgia. - We will continue Neurontin.  5.  Coronary artery disease. - We will continue aspirin, beta-blocker therapy and ARB therapy.  DVT prophylaxis: Lovenox. Code Status: This was discussed with her and she decided to be DNR/DNI. Family Communication:  The plan of care was discussed in details with the patient (and family). I answered all questions. The patient agreed to proceed with the above mentioned plan. Further management will depend upon hospital  course. Disposition Plan: Back to previous home environment Consults  called: none. All the records are reviewed and case discussed with ED provider.  Status is: Inpatient  Remains inpatient appropriate because:Altered mental status, Ongoing diagnostic testing needed not appropriate for outpatient work up, Unsafe d/c plan, IV treatments appropriate due to intensity of illness or inability to take PO, and Inpatient level of care appropriate due to severity of illness  Dispo: The patient is from: ALF              Anticipated d/c is to: ALF              Patient currently is not medically stable to d/c.   Difficult to place patient No   TOTAL TIME TAKING CARE OF THIS PATIENT: 55 minutes.    Christel Mormon M.D on 03/31/2021 at 9:43 PM  [facility]   From 7 PM-7 AM, contact night-coverage [email or web address]  CC: Primary care physician; Virgie Dad, FNP

## 2021-03-31 NOTE — Progress Notes (Signed)
PHARMACIST - PHYSICIAN COMMUNICATION  CONCERNING: P&T Medication Policy Regarding Oral Bisphosphonates  RECOMMENDATION: Your order for alendronate (Fosamax), ibandronate (Boniva), or risedronate (Actonel) has been discontinued at this time.  If the patient's post-hospital medical condition warrants safe use of this class of drugs, please resume the pre-hospital regimen upon discharge.  DESCRIPTION:  Alendronate (Fosamax), ibandronate (Boniva), and risedronate (Actonel) can cause severe esophageal erosions in patients who are unable to remain upright at least 30 minutes after taking this medication.   Since brief interruptions in therapy are thought to have minimal impact on bone mineral density, the Pharmacy & Therapeutics Committee has established that bisphosphonate orders should be routinely discontinued during hospitalization.   To override this safety policy and permit administration of Boniva, Fosamax, or Actonel in the hospital, prescribers must write "DO NOT HOLD" in the comments section when placing the order for this class of medications.   Otelia Sergeant, PharmD, [facility] 03/31/2021 10:57 PM

## 2021-03-31 NOTE — ED Triage Notes (Signed)
Pt to ED via [facility] from [facility]. Per EMS they were called out per family request for altered mental status. EMS reports that pt A & O x 3 for them. EMS states that the facility told them she is there for mouth sores and that she is currently on Lyrica. Pt reported to EMS that she noticed a funny smell when urinating. Pt was tachycardiac with EMS at 116. All other vital signs WNL. Pt CBG 158. Pt cool and clammy on arrival but is in NAD.

## 2021-03-31 NOTE — ED Provider Notes (Signed)
[facility] Emergency Department Provider Note  Time seen: 6:49 PM  I have reviewed the triage vital signs and the nursing notes.   HISTORY  Chief Complaint Altered Mental Status   HPI Kimberly Cook is a 75 y.o. female with a past medical history of allergies, anxiety, CKD, CAD, gastric reflux, hyperlipidemia, CVA, presents to the emergency department for altered mental status and fatigue.  According to report patient comes from nursing facility where she is currently in rehab for back issues.  Patient was seen in the emergency department recently for oral lesions diagnosed with stomatitis and discharged on a Magic mouthwash.  Patient states the lesions appear to be improving.  Today family was concerned because the patient seemed to be more confused than her baseline and also seemed a little more tired than usual so they sent the patient to the emergency department.  Patient denies any symptoms however on review of systems she does state foul smell to her urine.  No fever patient is mildly tachycardic around 110 bpm blood pressure currently 96/79.   Past Medical History:  Diagnosis Date   Allergy    Anxiety    Arthritis    Back pain    Chronic kidney disease    STAGE 3   Coronary artery disease 04/2017   Mild to moderate CAD in LAD/diagonal by CTA (CT-FFR of apical LAD 0.79).   DDD (degenerative disc disease), lumbar    Depression    Diverticulosis    Essential hypertension    Normal cardiolite 05/2006 EF 71%   GERD (gastroesophageal reflux disease)    Headache    History of shingles    Hyperlipidemia    Hypothyroidism    Lung nodule    Mini stroke ([place]) 2011    Occipital neuralgia    Palpitations    Pneumonia 2018   Prediabetes    Stroke [facility])    Stroke ([place])    MRI 04/2008 + left sup. frontal gyrus possibly puntate infarct    Syncope 2019   Urinary tract infection    Vitamin D deficiency     Patient Active Problem List   Diagnosis Date  Noted   Failed spinal cord stimulator, subsequent encounter 08/08/2020   S/P insertion of spinal cord stimulator 04/25/2020   Lumbar radiculopathy 12/07/2019   Lumbar facet arthropathy 12/07/2019   Spinal stenosis, lumbar region, with neurogenic claudication 12/07/2019   Osteoporosis 11/24/2019   Lumbar herniated disc 06/09/2019   Lumbar degenerative disc disease 06/09/2019   Spinal stenosis of cervical region 05/07/2019   Lumbar spondylosis 05/07/2019   Recurrent UTI 05/07/2019   Abnormal MRI, lumbar spine 02/18/2019   Abnormal gait 02/18/2019   Chronic back pain 02/18/2019   Osteoarthritis 10/09/2018   Overactive bladder 10/09/2018   Chronic midline low back pain 04/22/2018   Chronic pain of left knee 04/22/2018   Toe fracture, left 03/10/2018   Lightheadedness 01/21/2018   PAC (premature atrial contraction) 01/19/2018   PVC's (premature ventricular contractions) 01/19/2018   Hernia, paraesophageal 12/16/2017   Kidney cysts 12/16/2017   CKD (chronic kidney disease) stage 3, GFR 30-59 ml/min (HCC) 12/16/2017   Carotid artery stenosis 12/16/2017   Chronic pain 12/16/2017   Osteopenia 12/16/2017   DDD (degenerative disc disease), cervical 12/16/2017   Labile hypertension 09/05/2017   Anxiety and depression 08/27/2017   Insomnia 08/27/2017   Syncope 08/27/2017   Bilateral occipital neuralgia 08/27/2017   Lung nodule 06/30/2017   Coronary artery disease of native artery of  native heart with stable angina pectoris ([place]) 06/03/2017   SOB (shortness of breath) 04/11/2017   Labile blood pressure 03/08/2017   Syncope, near 03/08/2017   Palpitations 03/08/2017   Orthostatic lightheadedness 03/08/2017   Essential hypertension    Overweight (BMI 25.0-29.9) 06/17/2015   S/P TKR (total knee replacement) 04/17/2015   Morbid obesity ([place]) 09/21/2014   Medication management 09/21/2014   Hypothyroidism 02/17/2014   Hyperlipidemia LDL goal <70    GERD (gastroesophageal reflux disease)     Vitamin D deficiency    Depression     Past Surgical History:  Procedure Laterality Date   ABDOMINAL HYSTERECTOMY     BLADDER SURGERY     2003   BREAST EXCISIONAL BIOPSY Right Over 20 years    Benign   CHOLECYSTECTOMY     gastroplication      JOINT REPLACEMENT Left    KNEE   KNEE ARTHROSCOPY Left 2011   PULSE GENERATOR IMPLANT Left 01/31/2020   Procedure: PLACEMENT RIGHT FLANK PULSE GENERATOR VS REMOVAL SPINAL CORD STIMULATOR;  Surgeon: Deetta Perla, MD;  Location: [facility] ORS;  Service: Neurosurgery;  Laterality: Left;   PULSE GENERATOR IMPLANT Left 04/24/2020   Procedure: REPLACEMENT LEFT FLANK PULSE GENERATOR IMPLANT;  Surgeon: Deetta Perla, MD;  Location: [facility] ORS;  Service: Neurosurgery;  Laterality: Left;  MAC w/ local   right arm fracture     SPINAL CORD STIMULATOR REMOVAL N/A 06/26/2020   Procedure: SPINAL CORD STIMULATOR REMOVAL;  Surgeon: Deetta Perla, MD;  Location: [facility] ORS;  Service: Neurosurgery;  Laterality: N/A;   THORACIC LAMINECTOMY FOR SPINAL CORD STIMULATOR N/A 01/24/2020   Procedure: THORACIC SPINAL CORD STIMULATOR PADDLE TRIAL VIA LAMINECTOMY;  Surgeon: Deetta Perla, MD;  Location: [facility] ORS;  Service: Neurosurgery;  Laterality: N/A;   TONSILLECTOMY AND ADENOIDECTOMY     TOTAL KNEE ARTHROPLASTY Left 04/17/2015   Procedure: LEFT TOTAL KNEE ARTHROPLASTY;  Surgeon: Paralee Cancel, MD;  Location: [facility] ORS;  Service: Orthopedics;  Laterality: Left;    Prior to Admission medications   Medication Sig Start Date End Date Taking? Authorizing Provider  acetaminophen (TYLENOL) 500 MG tablet Take 1,000 mg by mouth every 8 (eight) hours as needed for moderate pain.    [provider]  alendronate (FOSAMAX) 70 MG tablet Take 70 mg by mouth once a week.  10/23/19   [provider]  aspirin EC 81 MG tablet Take 1 tablet (81 mg total) by mouth at bedtime. Swallow whole. 10/25/20   McLean-Scocuzza, Nino Glow, MD  atorvastatin (LIPITOR) 20 MG tablet Take 1 tablet (20 mg  total) by mouth daily. 06/20/20   McLean-Scocuzza, Nino Glow, MD  Biotin 1 MG CAPS Take 1 mg by mouth daily.     [provider]  CALCIUM PO Take 1 tablet by mouth daily.     [provider]  cetirizine (ZYRTEC) 10 MG tablet Take 10 mg by mouth every morning.    [provider]  Cholecalciferol (VITAMIN D3) 125 MCG (5000 UT) TABS Take 1 tablet (5,000 Units total) by mouth daily. 10/09/18   McLean-Scocuzza, Nino Glow, MD  clotrimazole (MYCELEX) 10 MG troche Take 1 tablet (10 mg total) by mouth 5 (five) times daily. 03/18/21   Menshew, Dannielle Karvonen, PA-C  Cranberry 500 MG CAPS Take 500 mg by mouth daily.    [provider]  diltiazem (CARDIZEM SR) 60 MG 12 hr capsule Take 1 capsule (60 mg total) by mouth 2 (two) times daily. 06/23/20   McLean-Scocuzza, Nino Glow, MD  escitalopram (LEXAPRO) 5 MG tablet Take 1 tablet (5 mg total) by mouth every morning. 06/23/20   McLean-Scocuzza, Nino Glow, MD  folic acid (FOLVITE) 097 MCG tablet Take 1 tablet (400 mcg total) by mouth daily. SEPARATE ALL SUPPLEMENTS TO LUNCH OR DINNER AND PRILOSEC NOT TO MESS W/THYROID MED 12/10/18   McLean-Scocuzza, Nino Glow, MD  gabapentin (NEURONTIN) 600 MG tablet Take 1 tablet (600 mg total) by mouth 3 (three) times daily. 12/28/20 03/28/21  Gillis Santa, MD  hydrALAZINE (APRESOLINE) 10 MG tablet Take 2 tablets (20 mg total) by mouth 2 (two) times daily as needed. BP>140/>90 11/02/18   McLean-Scocuzza, Nino Glow, MD  hydrochlorothiazide (MICROZIDE) 12.5 MG capsule TAKE 1 CAPSULE EVERY MORNING 09/26/20   McLean-Scocuzza, Nino Glow, MD  levothyroxine (SYNTHROID) 88 MCG tablet Take 1 tablet (88 mcg total) by mouth daily before breakfast. Skip sundays 12/02/19   McLean-Scocuzza, Nino Glow, MD  lidocaine (LIDODERM) 5 % Place 1-2 patches onto the skin daily as needed (pain). Remove & Discard patch within 12 hours or as directed by MD    [provider]  LORazepam (ATIVAN) 0.5 MG tablet Take 1 tablet (0.5 mg total) by  mouth 2 (two) times daily as needed for anxiety or sleep. Or 1 mg at night for sleep 06/23/20   McLean-Scocuzza, Nino Glow, MD  magic mouthwash (nystatin, lidocaine, diphenhydrAMINE, alum & mag hydroxide) suspension Swish and spit 5 mLs 4 (four) times daily as needed for mouth pain. 03/18/21   Menshew, Dannielle Karvonen, PA-C  montelukast (SINGULAIR) 10 MG tablet Take 1 tablet (10 mg total) by mouth at bedtime. 02/14/20   McLean-Scocuzza, Nino Glow, MD  MYRBETRIQ 50 MG TB24 tablet TAKE ONE TABLET EVERY DAY 10/20/20   Vaillancourt, Aldona Bar, PA-C  naloxegol oxalate (MOVANTIK) 12.5 MG TABS tablet Take 1-2 tablets (12.5-25 mg total) by mouth daily. Constipation x 3 days prn 10/21/19   McLean-Scocuzza, Nino Glow, MD  omeprazole (PRILOSEC) 40 MG capsule Take 1 capsule (40 mg total) by mouth daily. After lunch 06/23/20   McLean-Scocuzza, Nino Glow, MD  polyethylene glycol powder (GLYCOLAX/MIRALAX) 17 GM/SCOOP powder Take 17 g by mouth daily as needed for moderate constipation. 10/21/19   McLean-Scocuzza, Nino Glow, MD  potassium chloride (KLOR-CON) 10 MEQ tablet Take 1 tablet (10 mEq total) by mouth daily. 06/23/20   McLean-Scocuzza, Nino Glow, MD  Probiotic Product (HEALTHY COLON PO) Take 1 capsule by mouth daily.    [provider]  sodium chloride (OCEAN) 0.65 % SOLN nasal spray Place 2 sprays into both nostrils as needed for congestion. Use 1st 11/17/18   McLean-Scocuzza, Nino Glow, MD  tiZANidine (ZANAFLEX) 4 MG tablet Take 1 tablet (4 mg total) by mouth 2 (two) times daily as needed for muscle spasms. appt further refills call office 01/03/21   McLean-Scocuzza, Nino Glow, MD  traZODone (DESYREL) 50 MG tablet TAKE 2 TABLETS EVERY DAY 1 HOUR BEFORE BED 09/11/20   McLean-Scocuzza, Nino Glow, MD  Turmeric Curcumin 500 MG CAPS Take 500 mg by mouth daily.    [provider]  Vaginal Lubricant (REPLENS) GEL Place 1 application vaginally every 3 (three) days.     [provider]  vitamin B-12 (CYANOCOBALAMIN) 1000  MCG tablet Take 1 tablet (1,000 mcg total) by mouth daily. 10/09/18   McLean-Scocuzza, Nino Glow, MD    No Known Allergies  Family History  Problem Relation Age of Onset   Heart disease Mother    Hypertension Mother    Diabetes Mother  Heart attack Mother 88   Heart disease Father    Heart attack Father 27   Breast cancer Maternal Aunt    Heart attack Brother     Social History Social History   Tobacco Use   Smoking status: Passive Smoke Exposure - Never Smoker   Smokeless tobacco: Never   Tobacco comments:    husbands and children smoked in home.   Vaping Use   Vaping Use: Never used  Substance Use Topics   Alcohol use: No   Drug use: No    Review of Systems Constitutional: No known fever, increased weakness Cardiovascular: Negative for chest pain. Respiratory: Negative for shortness of breath.  Negative for cough. Gastrointestinal: Negative for abdominal pain, vomiting although positive for nausea. Genitourinary: States foul smell to her urine. Musculoskeletal: Negative for musculoskeletal complaints Neurological: Negative for headache All other ROS negative, although possibly somewhat limited due to confusion.  ____________________________________________   PHYSICAL EXAM:  VITAL SIGNS: ED Triage Vitals  Enc Vitals Group     BP 03/31/21 1823 96/79     Pulse Rate 03/31/21 1823 (!) 111     Resp 03/31/21 1823 14     Temp 03/31/21 1824 98.2 F (36.8 C)     Temp Source 03/31/21 1824 Oral     SpO2 03/31/21 1823 100 %     Weight --      Height --      Head Circumference --      Peak Flow --      Pain Score 03/31/21 1820 0     Pain Loc --      Pain Edu? --      Excl. in [place]? --     Constitutional: Patient is awake alert oriented to person and place. Eyes: Normal exam ENT      Head: Normocephalic and atraumatic.      Mouth/Throat: Mucous membranes are moist. Cardiovascular: Normal rate, regular rhythm.  Respiratory: Normal respiratory effort without  tachypnea nor retractions. Breath sounds are clear  Gastrointestinal: Soft and nontender. No distention.  Musculoskeletal: Nontender with normal range of motion in all extremities.  Neurologic:  Normal speech and language. No gross focal neurologic deficits Skin:  Skin is warm, dry and intact.  Psychiatric: Mood and affect are normal.   ____________________________________________    EKG  EKG read and interpreted by myself shows sinus tachycardia 115 bpm with a narrow QRS, normal axis, normal intervals, no concerning ST changes.  ____________________________________________    RADIOLOGY  Chest x-ray does not appear to show any acute finding.  ____________________________________________   INITIAL IMPRESSION / ASSESSMENT AND PLAN / ED COURSE  Pertinent labs & imaging results that were available during my care of the patient were reviewed by me and considered in my medical decision making (see chart for details).   Patient presents emergency department for increased confusion and weakness per family per report.  Currently the patient appears well she is mildly tachycardic at 115, afebrile and satting 100% with a normal respiratory rate.  Blood pressure is borderline hypotensive 96/79.  We will check labs, urinalysis, obtain a chest x-ray and a COVID swab.  We will IV hydrate while awaiting results.  Differential at this time is quite broad but would include infectious etiology such as pneumonia or urinary tract infection or COVID, electrolyte or metabolic abnormality such as renal insufficiency or dehydration.  We will IV hydrate while waiting for the results.  Patient's work-up shows urinalysis consistent with urinary tract infection which  likely explains the patient's increased confusion and weakness.  Patient is sodium is also 127.  Patient receiving IV fluids, I have ordered IV Rocephin and send urine culture as well as blood cultures.  We will admit to the hospital service.  BINA VEENSTRA was evaluated in Emergency Department on 03/31/2021 for the symptoms described in the history of present illness. She was evaluated in the context of the global COVID-19 pandemic, which necessitated consideration that the patient might be at risk for infection with the SARS-CoV-2 virus that causes COVID-19. Institutional protocols and algorithms that pertain to the evaluation of patients at risk for COVID-19 are in a state of rapid change based on information released by regulatory bodies including the CDC and federal and state organizations. These policies and algorithms were followed during the patient's care in the ED.  ____________________________________________   FINAL CLINICAL IMPRESSION(S) / ED DIAGNOSES  Weakness Confusion Urinary tract infection Hyponatremia   Harvest Dark, MD 03/31/21 2130

## 2021-03-31 NOTE — ED Notes (Signed)
Pt care assumed at this time. Pt NAD in bed, a/ox2. Per medics. Pt family called for her to be transported due to increasing ALOC with HX UTI. Pt does state her urine smells bad. Pt denies dysuria, CP, SOB, dizziness, ABD pain, n/v/d.

## 2021-03-31 NOTE — Progress Notes (Signed)
PHARMACIST - PHYSICIAN ORDER COMMUNICATION  CONCERNING: P&T Medication Policy on Herbal Medications  DESCRIPTION:  This patient's order for:  Cranberry CAPS 500 mg,  Turmeric Curcumin CAPS 500 mg,  & Biotin CAPS 1 mg has been noted.  This product(s) is classified as an "herbal" or natural product. Due to a lack of definitive safety studies or FDA approval, nonstandard manufacturing practices, plus the potential risk of unknown drug-drug interactions while on inpatient medications, the Pharmacy and Therapeutics Committee does not permit the use of "herbal" or natural products of this type within [place].   ACTION TAKEN: The pharmacy department is unable to verify this order at this time. Please reevaluate patient's clinical condition at discharge and address if the herbal or natural product(s) should be resumed at that time.   Otelia Sergeant, PharmD, [facility] 03/31/2021 11:05 PM

## 2021-04-01 ENCOUNTER — Other Ambulatory Visit: Payer: Self-pay

## 2021-04-01 ENCOUNTER — Inpatient Hospital Stay: Payer: Medicare HMO

## 2021-04-01 ENCOUNTER — Encounter: Payer: Self-pay | Admitting: Family Medicine

## 2021-04-01 LAB — BASIC METABOLIC PANEL
Anion gap: 10 (ref 5–15)
BUN: 10 mg/dL (ref 8–23)
CO2: 26 mmol/L (ref 22–32)
Calcium: 8.2 mg/dL — ABNORMAL LOW (ref 8.9–10.3)
Chloride: 95 mmol/L — ABNORMAL LOW (ref 98–111)
Creatinine, Ser: 0.55 mg/dL (ref 0.44–1.00)
GFR, Estimated: >60 mL/min (ref >60)
Glucose, Bld: 95 mg/dL (ref 70–99)
Potassium: 3 mmol/L — ABNORMAL LOW (ref 3.5–5.1)
Sodium: 131 mmol/L — ABNORMAL LOW (ref 135–145)

## 2021-04-01 LAB — CBC
HCT: 34.5 % — ABNORMAL LOW (ref 36.0–46.0)
Hemoglobin: 12.7 g/dL (ref 12.0–15.0)
MCH: 33.5 pg (ref 26.0–34.0)
MCHC: 36.8 g/dL — ABNORMAL HIGH (ref 30.0–36.0)
MCV: 91 fL (ref 80.0–100.0)
Platelets: 246 10*3/uL (ref 150–400)
RBC: 3.79 MIL/uL — ABNORMAL LOW (ref 3.87–5.11)
RDW: 13.2 % (ref 11.5–15.5)
WBC: 5.5 10*3/uL (ref 4.0–10.5)
nRBC: 0 % (ref 0.0–0.2)

## 2021-04-01 MED ORDER — POTASSIUM CHLORIDE CRYS ER 20 MEQ PO TBCR
40.0000 meq | EXTENDED_RELEASE_TABLET | Freq: Every day | ORAL | Status: DC
  Administered 2021-04-01: 40 meq via ORAL
  Filled 2021-04-01: qty 2

## 2021-04-01 MED ORDER — SODIUM CHLORIDE 0.9 % IV BOLUS
1000.0000 mL | Freq: Once | INTRAVENOUS | Status: AC
  Administered 2021-04-01: 1000 mL via INTRAVENOUS

## 2021-04-01 NOTE — Progress Notes (Signed)
Kimberly Cook/Son contact # [phone]

## 2021-04-01 NOTE — Plan of Care (Signed)

## 2021-04-01 NOTE — Progress Notes (Signed)
PROGRESS NOTE    Kimberly Cook  TGG:[id number] DOB: [birth year] DOA: 03/31/2021 PCP: Melonie [place], FNP  Brief Narrative:  75 y.o. Caucasian female with medical history significant for coronary artery disease, hypertension, CVA, dyslipidemia, hypothyroidism and stage III chronic kidney disease, and anxiety and depression, who presented to the emergency room with acute onset of altered mental status with confusion as well as generalized weakness at her assisted living facility.  The patient admits to urinary frequency and urgency without dysuria or hematuria or flank pain.   Assessment & Plan:   Active Problems:   Metabolic encephalopathy  Acute urinary tract infection Acute metabolic encephalopathy Altered mentation likely secondary to UTI Hyponatremia also possible cause Plan: Continue IV Rocephin Continue IV fluids Follow sodium levels Frequent neurochecks Follow culture data Monitor vitals and fever curve  Hyponatremia Suspect hypovolemic hyponatremia Improved after IV fluids Will continue IVF and treatment for UTI  Essential hypertension Continue Cardizem CD, Cozaar, hydralazine per home meds Hold home hydrochlorothiazide  Hypothyroidism PTA Synthroid  Bilateral occipital neuralgia PTA Neurontin  Coronary artery disease Continue aspirin, beta-blocker, ARB   DVT prophylaxis: SQ Lovenox Code Status: DNR Family Communication: None today Disposition Plan: Status is: Inpatient  Remains inpatient appropriate because:Inpatient level of care appropriate due to severity of illness  Dispo: The patient is from: ALF              Anticipated d/c is to: ALF              Patient currently is not medically stable to d/c.   Difficult to place patient No  Level of care: Med-Surg  Consultants:  None  Procedures:  None  Antimicrobials:  Ceftriaxone   Subjective: Seen and examined.  No somewhat confused but overall stable.  In no  distress.  Objective: Vitals:   04/01/21 0031 04/01/21 0502 04/01/21 0502 04/01/21 0828  BP: 131/85 102/69  104/70  Pulse: 100 99  (!) 102  Resp: 16 18  18   Temp: 97.9 F (36.6 C) 97.9 F (36.6 C)  98.3 F (36.8 C)  TempSrc: Oral Oral Oral Oral  SpO2: 100% 95%  99%  Weight: 57.2 kg     Height: 5\' 2"  (1.575 m)       Intake/Output Summary (Last 24 hours) at 04/01/2021 1250 Last data filed at 04/01/2021 0500 Gross per 24 hour  Intake --  Output 400 ml  Net -400 ml   Filed Weights   03/31/21 2055 04/01/21 0031  Weight: 59 kg 57.2 kg    Examination:  General exam: No acute distress.  Confused Respiratory system: Clear to auscultation. Respiratory effort normal. Cardiovascular system: S1 & S2 heard, RRR. No JVD, murmurs, rubs, gallops or clicks. No pedal edema. Gastrointestinal system: Soft, nondistended, mild tender to palpation suprapubic region Central nervous system: Alert, oriented x2, no focal deficits Extremities: Symmetric 5 x 5 power. Skin: No rashes, lesions or ulcers Psychiatry: Judgement and insight appear normal. Mood & affect appropriate.     Data Reviewed: I have personally reviewed following labs and imaging studies  CBC: Recent Labs  Lab 03/31/21 1925 04/01/21 0556  WBC 8.4 5.5  NEUTROABS 6.8  --   HGB 15.0 12.7  HCT 41.8 34.5*  MCV 89.1 91.0  PLT 344 246   Basic Metabolic Panel: Recent Labs  Lab 03/31/21 1925 04/01/21 0556  NA 127* 131*  K 3.3* 3.0*  CL 87* 95*  CO2 26 26  GLUCOSE 130* 95  BUN 13 10  CREATININE 0.76  0.55  CALCIUM 9.0 8.2*   GFR: Estimated Creatinine Clearance: 47.3 mL/min (by C-G formula based on SCr of 0.55 mg/dL). Liver Function Tests: Recent Labs  Lab 03/31/21 1925  AST 32  ALT 19  ALKPHOS 102  BILITOT 1.2  PROT 7.3  ALBUMIN 3.1*   No results for input(s): LIPASE, AMYLASE in the last 168 hours. No results for input(s): AMMONIA in the last 168 hours. Coagulation Profile: Recent Labs  Lab  03/31/21 1925  INR 1.0   Cardiac Enzymes: No results for input(s): CKTOTAL, CKMB, CKMBINDEX, TROPONINI in the last 168 hours. BNP (last 3 results) No results for input(s): PROBNP in the last 8760 hours. HbA1C: No results for input(s): HGBA1C in the last 72 hours. CBG: No results for input(s): GLUCAP in the last 168 hours. Lipid Profile: No results for input(s): CHOL, HDL, LDLCALC, TRIG, CHOLHDL, LDLDIRECT in the last 72 hours. Thyroid Function Tests: No results for input(s): TSH, T4TOTAL, FREET4, T3FREE, THYROIDAB in the last 72 hours. Anemia Panel: No results for input(s): VITAMINB12, FOLATE, FERRITIN, TIBC, IRON, RETICCTPCT in the last 72 hours. Sepsis Labs: Recent Labs  Lab 03/31/21 2100  LATICACIDVEN 0.9    Recent Results (from the past 240 hour(s))  Blood Culture (routine x 2)     Status: None (Preliminary result)   Collection Time: 03/31/21  7:15 PM   Specimen: BLOOD  Result Value Ref Range Status   Specimen Description BLOOD LEFT ANTECUBITAL  Final   Special Requests   Final    BOTTLES DRAWN AEROBIC AND ANAEROBIC Blood Culture adequate volume   Culture   Final    NO GROWTH < 12 HOURS Performed at [facility], [address], [place], [place] [address]    Report Status PENDING  Incomplete  Blood Culture (routine x 2)     Status: None (Preliminary result)   Collection Time: 03/31/21  7:19 PM   Specimen: BLOOD  Result Value Ref Range Status   Specimen Description BLOOD RIGHT ANTECUBITAL  Final   Special Requests   Final    BOTTLES DRAWN AEROBIC AND ANAEROBIC Blood Culture adequate volume   Culture   Final    NO GROWTH < 12 HOURS Performed at [facility], [address] [place], [place] [address]    Report Status PENDING  Incomplete  Resp Panel by RT-PCR (Flu A&B, Covid) Nasopharyngeal Swab     Status: None   Collection Time: 03/31/21 11:05 PM   Specimen: Nasopharyngeal Swab; Nasopharyngeal(NP) swabs in vial transport medium  Result Value  Ref Range Status   SARS Coronavirus 2 by RT PCR NEGATIVE NEGATIVE Final    Comment: (NOTE) SARS-CoV-2 target nucleic acids are NOT DETECTED.  The SARS-CoV-2 RNA is generally detectable in upper respiratory specimens during the acute phase of infection. The lowest concentration of SARS-CoV-2 viral copies this assay can detect is 138 copies/mL. A negative result does not preclude SARS-Cov-2 infection and should not be used as the sole basis for treatment or other patient management decisions. A negative result may occur with  improper specimen collection/handling, submission of specimen other than nasopharyngeal swab, presence of viral mutation(s) within the areas targeted by this assay, and inadequate number of viral copies(<138 copies/mL). A negative result must be combined with clinical observations, patient history, and epidemiological information. The expected result is Negative.  Fact Sheet for Patients:  BloggerCourse.com  Fact Sheet for Healthcare Providers:  SeriousBroker.it  This test is no t yet approved or cleared by the [place] FDA  and  has been authorized for detection and/or diagnosis of SARS-CoV-2 by FDA under an Emergency Use Authorization (EUA). This EUA will remain  in effect (meaning this test can be used) for the duration of the COVID-19 declaration under Section 564(b)(1) of the Act, 21 U.S.C.section 360bbb-3(b)(1), unless the authorization is terminated  or revoked sooner.       Influenza A by PCR NEGATIVE NEGATIVE Final   Influenza B by PCR NEGATIVE NEGATIVE Final    Comment: (NOTE) The Xpert Xpress SARS-CoV-2/FLU/RSV plus assay is intended as an aid in the diagnosis of influenza from Nasopharyngeal swab specimens and should not be used as a sole basis for treatment. Nasal washings and aspirates are unacceptable for Xpert Xpress SARS-CoV-2/FLU/RSV testing.  Fact Sheet for  Patients: BloggerCourse.com  Fact Sheet for Healthcare Providers: SeriousBroker.it  This test is not yet approved or cleared by the [place] FDA and has been authorized for detection and/or diagnosis of SARS-CoV-2 by FDA under an Emergency Use Authorization (EUA). This EUA will remain in effect (meaning this test can be used) for the duration of the COVID-19 declaration under Section 564(b)(1) of the Act, 21 U.S.C. section 360bbb-3(b)(1), unless the authorization is terminated or revoked.  Performed at [facility], [address] [place], [place] [address]          Radiology Studies: [facility] Chest [place] 1 View  Result Date: 03/31/2021 CLINICAL DATA:  Questionable sepsis.  Evaluate for abnormality. EXAM: PORTABLE CHEST 1 VIEW COMPARISON:  March 04, 2017 FINDINGS: No pneumothorax. Nodular density in the right apex may be associated with the nearby EKG lead. The heart, hila, and mediastinum are unremarkable. The lungs are otherwise clear. Pedicle rods and screws seen in the thoracic and lumbar spine. IMPRESSION: 1. No evidence of pneumonia. 2. Nodular density over the right apex may be associated with the nearby EKG lead. Recommend a PA and lateral chest x-ray without EKG leads before discharge. Electronically Signed   By: Gerome Sam III M.D.   On: 03/31/2021 18:58        Scheduled Meds:  aspirin EC  81 mg Oral QHS   atorvastatin  20 mg Oral Daily   calcium carbonate  1 tablet Oral Q1200   cholecalciferol  5,000 Units Oral Q1200   diltiazem  60 mg Oral BID   enoxaparin (LOVENOX) injection  40 mg Subcutaneous QHS   escitalopram  5 mg Oral BH-q7a   folic acid  500 mcg Oral Q1200   gabapentin  600 mg Oral TID   [START ON 04/02/2021] levothyroxine  88 mcg Oral Once per day on Mon Tue Wed Thu Fri Sat   loratadine  10 mg Oral Daily   mirabegron ER  50 mg Oral Daily   montelukast  10 mg Oral QHS   pantoprazole  40 mg  Oral Daily   potassium chloride SA  40 mEq Oral Daily   traZODone  100 mg Oral Daily   vitamin B-12  1,000 mcg Oral Q1200   Continuous Infusions:  0.9 % NaCl with KCl 20 mEq / L 100 mL/hr at 04/01/21 5093   cefTRIAXone (ROCEPHIN)  IV     lactated ringers Stopped (04/01/21 0835)     LOS: 1 day    Time spent: 25 minutes    Tresa Moore, MD [facility] Pager [phone]  If 7PM-7AM, please contact night-coverage 04/01/2021, 12:50 PM

## 2021-04-01 NOTE — TOC Initial Note (Signed)
Transition of Care [facility]) - Initial/Assessment Note    Patient Details  Name: Kimberly Cook MRN: [id number] Date of Birth: 10-11-1945  Transition [facility]) CM/SW Contact:    Liliana Cline, LCSW Phone Number: 04/01/2021, 3:25 PM  Clinical Narrative:          CSW spoke with son [name]) who confirmed patient is from [facility]. She is there for short term rehab, but they are in the process of transitioning her over to long term care. Patient was previously living at [facility]. Patient mostly uses a wheelchair at the SNF. TOC will follow to assist with discharge back to [facility] when medically ready.          Expected Discharge Plan: Skilled Nursing Facility Barriers to Discharge: Continued Medical Work up   Patient Goals and CMS Choice Patient states their goals for this hospitalization and ongoing recovery are:: to return to SNF CMS Medicare.gov Compare Post Acute Care list provided to:: Patient Represenative (must comment) Choice offered to / list presented to : Adult Children  Expected Discharge Plan and Services Expected Discharge Plan: Skilled Nursing Facility       Living arrangements for the past 2 months: Skilled Nursing Facility                                      Prior Living Arrangements/Services Living arrangements for the past 2 months: Skilled Nursing Facility Lives with:: Facility Resident Patient language and need for interpreter reviewed:: Yes Do you feel safe going back to the place where you live?: Yes      Need for Family Participation in Patient Care: Yes (Comment) Care giver support system in place?: Yes (comment) Current home services: DME Criminal Activity/Legal Involvement Pertinent to Current Situation/Hospitalization: No - Comment as needed  Activities of Daily Living Home Assistive Devices/Equipment: Bedside commode/3-in-1, [place] (specify type) ADL Screening (condition at time of admission) Patient's cognitive  ability adequate to safely complete daily activities?: No Is the patient deaf or have difficulty hearing?: No Does the patient have difficulty seeing, even when wearing glasses/contacts?: No Does the patient have difficulty concentrating, remembering, or making decisions?: Yes Patient able to express need for assistance with ADLs?: Yes Does the patient have difficulty dressing or bathing?: Yes Independently performs ADLs?: No Communication: Independent Dressing (OT): Needs assistance, Appropriate for developmental age Is this a change from baseline?: Pre-admission baseline Grooming: Needs assistance, Appropriate for developmental age Is this a change from baseline?: Pre-admission baseline Feeding: Independent Bathing: Needs assistance Is this a change from baseline?: Pre-admission baseline Toileting: Needs assistance Is this a change from baseline?: Pre-admission baseline In/Out Bed: Needs assistance Is this a change from baseline?: Pre-admission baseline Walks in Home: Appropriate for developmental age, Independent with device (comment) Does the patient have difficulty walking or climbing stairs?: Yes Weakness of Legs: Both Weakness of Arms/Hands: None  Permission Sought/Granted Permission sought to share information with : Oceanographer granted to share information with : Yes, Verbal Permission Granted (by son [name] due to patient's confusion)     Permission granted to share info w AGENCY: [facility]        Emotional Assessment       Orientation: : Fluctuating Orientation (Suspected and/or reported Sundowners) Alcohol / Substance Use: Not Applicable Psych Involvement: No (comment)  Admission diagnosis:  Metabolic encephalopathy [G93.41] Lower urinary tract infectious disease [N39.0]  Hyponatremia [E87.1] Patient Active Problem List   Diagnosis Date Noted   Metabolic encephalopathy 03/31/2021   Failed spinal cord stimulator, subsequent  encounter 08/08/2020   S/P insertion of spinal cord stimulator 04/25/2020   Lumbar radiculopathy 12/07/2019   Lumbar facet arthropathy 12/07/2019   Spinal stenosis, lumbar region, with neurogenic claudication 12/07/2019   Osteoporosis 11/24/2019   Lumbar herniated disc 06/09/2019   Lumbar degenerative disc disease 06/09/2019   Spinal stenosis of cervical region 05/07/2019   Lumbar spondylosis 05/07/2019   Recurrent UTI 05/07/2019   Abnormal MRI, lumbar spine 02/18/2019   Abnormal gait 02/18/2019   Chronic back pain 02/18/2019   Osteoarthritis 10/09/2018   Overactive bladder 10/09/2018   Chronic midline low back pain 04/22/2018   Chronic pain of left knee 04/22/2018   Toe fracture, left 03/10/2018   Lightheadedness 01/21/2018   PAC (premature atrial contraction) 01/19/2018   PVC's (premature ventricular contractions) 01/19/2018   Hernia, paraesophageal 12/16/2017   Kidney cysts 12/16/2017   CKD (chronic kidney disease) stage 3, GFR 30-59 ml/min (HCC) 12/16/2017   Carotid artery stenosis 12/16/2017   Chronic pain 12/16/2017   Osteopenia 12/16/2017   DDD (degenerative disc disease), cervical 12/16/2017   Labile hypertension 09/05/2017   Anxiety and depression 08/27/2017   Insomnia 08/27/2017   Syncope 08/27/2017   Bilateral occipital neuralgia 08/27/2017   Lung nodule 06/30/2017   Coronary artery disease of native artery of native heart with stable angina pectoris (HCC) 06/03/2017   SOB (shortness of breath) 04/11/2017   Labile blood pressure 03/08/2017   Syncope, near 03/08/2017   Palpitations 03/08/2017   Orthostatic lightheadedness 03/08/2017   Essential hypertension    Overweight (BMI 25.0-29.9) 06/17/2015   S/P TKR (total knee replacement) 04/17/2015   Morbid obesity (HCC) 09/21/2014   Medication management 09/21/2014   Hypothyroidism 02/17/2014   Hyperlipidemia LDL goal <70    GERD (gastroesophageal reflux disease)    Vitamin D deficiency    Depression    PCP:   Melonie [place], FNP Pharmacy:   [facility] (Now [facility]) - [place], [place] - [address] [place] [address] Phone: [phone] Fax: [phone]  [facility] - [place], [place] - [address] [place] [address] Phone: [phone] Fax: [phone]     Social Determinants of Health (SDOH) Interventions    Readmission Risk Interventions No flowsheet data found.

## 2021-04-01 NOTE — [place] FL2 (Signed)
[place] MEDICAID FL2 LEVEL OF CARE SCREENING TOOL     IDENTIFICATION  Patient Name: Kimberly Cook Birthdate: [birth year] Sex: female Admission Date (Current Location): 03/31/2021  [facility] [place] and [place] Number:  Chiropodist and Address:  [facility], [address] [place], [place] [address]      Provider Number: [id number]  Attending Physician Name and Address:  Tresa Moore, MD  Relative Name and Phone Number:  Shelly Rubenstein [phone]    Current Level of Care: Hospital Recommended Level of Care: Skilled Nursing Facility Prior Approval Number:    Date Approved/Denied:   PASRR Number: [id number]  Discharge Plan:      Current Diagnoses: Patient Active Problem List   Diagnosis Date Noted   Metabolic encephalopathy 03/31/2021   Failed spinal cord stimulator, subsequent encounter 08/08/2020   S/P insertion of spinal cord stimulator 04/25/2020   Lumbar radiculopathy 12/07/2019   Lumbar facet arthropathy 12/07/2019   Spinal stenosis, lumbar region, with neurogenic claudication 12/07/2019   Osteoporosis 11/24/2019   Lumbar herniated disc 06/09/2019   Lumbar degenerative disc disease 06/09/2019   Spinal stenosis of cervical region 05/07/2019   Lumbar spondylosis 05/07/2019   Recurrent UTI 05/07/2019   Abnormal MRI, lumbar spine 02/18/2019   Abnormal gait 02/18/2019   Chronic back pain 02/18/2019   Osteoarthritis 10/09/2018   Overactive bladder 10/09/2018   Chronic midline low back pain 04/22/2018   Chronic pain of left knee 04/22/2018   Toe fracture, left 03/10/2018   Lightheadedness 01/21/2018   PAC (premature atrial contraction) 01/19/2018   PVC's (premature ventricular contractions) 01/19/2018   Hernia, paraesophageal 12/16/2017   Kidney cysts 12/16/2017   CKD (chronic kidney disease) stage 3, GFR 30-59 ml/min (HCC) 12/16/2017   Carotid artery stenosis 12/16/2017   Chronic pain 12/16/2017   Osteopenia 12/16/2017    DDD (degenerative disc disease), cervical 12/16/2017   Labile hypertension 09/05/2017   Anxiety and depression 08/27/2017   Insomnia 08/27/2017   Syncope 08/27/2017   Bilateral occipital neuralgia 08/27/2017   Lung nodule 06/30/2017   Coronary artery disease of native artery of native heart with stable angina pectoris (HCC) 06/03/2017   SOB (shortness of breath) 04/11/2017   Labile blood pressure 03/08/2017   Syncope, near 03/08/2017   Palpitations 03/08/2017   Orthostatic lightheadedness 03/08/2017   Essential hypertension    Overweight (BMI 25.0-29.9) 06/17/2015   S/P TKR (total knee replacement) 04/17/2015   Morbid obesity (HCC) 09/21/2014   Medication management 09/21/2014   Hypothyroidism 02/17/2014   Hyperlipidemia LDL goal <70    GERD (gastroesophageal reflux disease)    Vitamin D deficiency    Depression     Orientation RESPIRATION BLADDER Height & Weight     Self, Time, Place  Normal   Weight: 126 lb 1.7 oz (57.2 kg) Height:  5\' 2"  (157.5 cm)  BEHAVIORAL SYMPTOMS/MOOD NEUROLOGICAL BOWEL NUTRITION STATUS        Diet (heart diet; thin liquids)  AMBULATORY STATUS COMMUNICATION OF NEEDS Skin   Extensive Assist Verbally Normal                       Personal Care Assistance Level of Assistance  Bathing, Feeding, Dressing Bathing Assistance: Maximum assistance Feeding assistance: Maximum assistance Dressing Assistance: Maximum assistance     Functional Limitations Info             SPECIAL CARE FACTORS FREQUENCY  PT (By licensed PT), OT (By licensed OT)     PT  Frequency: per facility OT Frequency: per facility            Contractures      Additional Factors Info  Code Status, Allergies Code Status Info: DNR Allergies Info: nka           Current Medications (04/01/2021):  This is the current hospital active medication list Current Facility-Administered Medications  Medication Dose Route Frequency Provider Last Rate Last Admin   0.9 %  NaCl with KCl 20 mEq/ L  infusion   Intravenous Continuous Mansy, Jan A, MD 100 mL/hr at 04/01/21 0838 New Bag at 04/01/21 0838   acetaminophen (TYLENOL) tablet 650 mg  650 mg Oral Q6H PRN Mansy, Jan A, MD       Or   acetaminophen (TYLENOL) suppository 650 mg  650 mg Rectal Q6H PRN Mansy, Vernetta Honey, MD       aspirin EC tablet 81 mg  81 mg Oral QHS Mansy, Jan A, MD   81 mg at 03/31/21 2327   atorvastatin (LIPITOR) tablet 20 mg  20 mg Oral Daily Mansy, Jan A, MD   20 mg at 04/01/21 0831   calcium carbonate (OS-CAL - dosed in mg of elemental calcium) tablet 500 mg of elemental calcium  1 tablet Oral Q1200 Mansy, Jan A, MD   500 mg of elemental calcium at 04/01/21 1245   cefTRIAXone (ROCEPHIN) 1 g in sodium chloride 0.9 % 100 mL IVPB  1 g Intravenous Q24H Mansy, Jan A, MD       cholecalciferol (VITAMIN D3) tablet 5,000 Units  5,000 Units Oral Q1200 Mansy, Jan A, MD   5,000 Units at 04/01/21 1245   diltiazem (CARDIZEM SR) 12 hr capsule 60 mg  60 mg Oral BID Mansy, Jan A, MD   60 mg at 04/01/21 0832   enoxaparin (LOVENOX) injection 40 mg  40 mg Subcutaneous QHS Mansy, Jan A, MD   40 mg at 03/31/21 2328   escitalopram (LEXAPRO) tablet 5 mg  5 mg Oral BH-q7a Mansy, Jan A, MD   5 mg at 04/01/21 3614   folic acid (FOLVITE) tablet 0.5 mg  500 mcg Oral Q1200 Mansy, Jan A, MD   0.5 mg at 04/01/21 1245   gabapentin (NEURONTIN) tablet 600 mg  600 mg Oral TID Mansy, Jan A, MD   600 mg at 04/01/21 0831   hydrALAZINE (APRESOLINE) tablet 20 mg  20 mg Oral BID PRN Mansy, Jan A, MD       [START ON 04/02/2021] levothyroxine (SYNTHROID) tablet 88 mcg  88 mcg Oral Once per day on Mon Tue Wed Thu Fri Sat Mansy, Jan A, MD       lidocaine (LIDODERM) 5 % 1-2 patch  1-2 patch Transdermal Daily PRN Mansy, Jan A, MD       loratadine (CLARITIN) tablet 10 mg  10 mg Oral Daily Mansy, Jan A, MD   10 mg at 04/01/21 0831   LORazepam (ATIVAN) tablet 0.5 mg  0.5 mg Oral BID PRN Mansy, Jan A, MD       magic mouthwash w/lidocaine  5 mL Oral  QID PRN Otelia Sergeant, RPH       magnesium hydroxide (MILK OF MAGNESIA) suspension 30 mL  30 mL Oral Daily PRN Mansy, Jan A, MD       mirabegron ER (MYRBETRIQ) tablet 50 mg  50 mg Oral Daily Mansy, Jan A, MD   50 mg at 04/01/21 0832   montelukast (SINGULAIR) tablet 10 mg  10 mg Oral  QHS Mansy, Jan A, MD   10 mg at 03/31/21 2327   ondansetron [facility]) tablet 4 mg  4 mg Oral Q6H PRN Mansy, Jan A, MD       Or   ondansetron [facility]) injection 4 mg  4 mg Intravenous Q6H PRN Mansy, Jan A, MD       pantoprazole (PROTONIX) EC tablet 40 mg  40 mg Oral Daily Mansy, Jan A, MD   40 mg at 04/01/21 0831   polyethylene glycol (MIRALAX / GLYCOLAX) packet 17 g  17 g Oral Daily PRN Mansy, Jan A, MD       potassium chloride SA (KLOR-CON) CR tablet 40 mEq  40 mEq Oral Daily Georgeann Oppenheim, Sudheer B, MD   40 mEq at 04/01/21 1245   sodium chloride (OCEAN) 0.65 % nasal spray 2 spray  2 spray Each Nare PRN Mansy, Jan A, MD       tiZANidine (ZANAFLEX) tablet 4 mg  4 mg Oral BID PRN Mansy, Jan A, MD       traZODone (DESYREL) tablet 100 mg  100 mg Oral Daily Mansy, Jan A, MD   100 mg at 03/31/21 2327   vitamin B-12 (CYANOCOBALAMIN) tablet 1,000 mcg  1,000 mcg Oral Q1200 Mansy, Jan A, MD   1,000 mcg at 04/01/21 1245     Discharge Medications: Please see discharge summary for a list of discharge medications.  Relevant Imaging Results:  Relevant Lab Results:   Additional Information SS #: [id number]  Monicia Tse E Genola Yuille, LCSW

## 2021-04-02 MED ORDER — PROSOURCE PLUS PO LIQD
30.0000 mL | Freq: Two times a day (BID) | ORAL | Status: DC
  Administered 2021-04-02 – 2021-04-03 (×3): 30 mL via ORAL
  Filled 2021-04-02: qty 30

## 2021-04-02 MED ORDER — ADULT MULTIVITAMIN W/MINERALS CH
1.0000 | ORAL_TABLET | Freq: Every day | ORAL | Status: DC
  Administered 2021-04-03 – 2021-04-04 (×2): 1 via ORAL
  Filled 2021-04-02 (×2): qty 1

## 2021-04-02 MED ORDER — POTASSIUM CHLORIDE CRYS ER 20 MEQ PO TBCR
40.0000 meq | EXTENDED_RELEASE_TABLET | Freq: Two times a day (BID) | ORAL | Status: DC
  Administered 2021-04-02 – 2021-04-04 (×5): 40 meq via ORAL
  Filled 2021-04-02 (×5): qty 2

## 2021-04-02 NOTE — Evaluation (Signed)
Physical Therapy Evaluation Patient Details Name: Kimberly Cook MRN: [id number] DOB: 09-Oct-1945 Today's Date: 04/02/2021   History of Present Illness  Pt is a 75 y.o. female presenting to hospital from [facility] (rehab for back issues) with AMS and fatigue.  Recent ED visit for oral lesions (dx with stomatitis).  Pt admitted with acute metabolic encephalopathy likely d/t UTI and hyponatremia, essential htn, hypothyroidism, B occipital neuralgia, and CAD.  Of note, pt s/p L1-L5 XLIF 01/12/21 and then T4-pelvis PSF 01/15/21 at outside hospital.  PMH includes anxiety, CKD, CAD, gastric reflux, HLD, CVA, TKR L.  Clinical Impression  Prior to hospital admission, pt was most recently at rehab ambulating short distances with walker and therapist assist; was independent ambulating with rollator prior to extensive back surgery in June 2022.  Pt oriented to person, place, and time (pt unsure why she was in hospital but said her sons told her it was d/t her not making sense when she was talking).  Pt able to talk about recent back surgery and rehab.  Currently pt is min to mod assist with bed mobility via logrolling and unable to stand pt (or clear pt's bottom from bed) x3 trials from bed up to walker d/t LE weakness.  Pt would benefit from skilled PT to address noted impairments and functional limitations (see below for any additional details).  Upon hospital discharge, pt would benefit from continued rehab at [facility].    Follow Up Recommendations SNF    Equipment Recommendations   (TBD at next facility)    Recommendations for Other Services OT consult     Precautions / Restrictions Precautions Precautions: Fall;Back Precaution Booklet Issued: No Precaution Comments: h/o L1-L5 XLIF 01/12/21 and T4-pelvis PSF 01/15/21 (pt reports Dr. Marcell Barlow said she did not have to wear TLSO brace anymore 2-3 weeks ago d/t brace causing her pain) Restrictions Weight Bearing Restrictions: No      Mobility  Bed  Mobility Overal bed mobility: Needs Assistance Bed Mobility: Rolling;Sidelying to Sit;Sit to Sidelying Rolling: Min assist Sidelying to sit: Mod assist;HOB elevated     Sit to sidelying: Mod assist;HOB elevated General bed mobility comments: assist for logrolling L and R in bed; assist for trunk and B LE's; vc's for technique (including logrolling)    Transfers Overall transfer level: Needs assistance Equipment used: Rolling walker (2 wheeled) Transfers: Sit to/from Stand Sit to Stand: Total assist;From elevated surface         General transfer comment: x3 trials; unable to stand from regular height bed x2 trials and elevated bed x1 trial; vc's for UE/LE placement; unable to clear pt's bottom from bed with attempted standing  Ambulation/Gait             General Gait Details: unable to stand to attempt  Stairs            Wheelchair Mobility    Modified Rankin (Stroke Patients Only)       Balance Overall balance assessment: Needs assistance Sitting-balance support: No upper extremity supported;Feet supported Sitting balance-Leahy Scale: Poor Sitting balance - Comments: pt with intermittent posterior lean in sitting requiring close SBA to min assist for safety/balance       Standing balance comment: unable to stand to assess                             Pertinent Vitals/Pain Pain Assessment: No/denies pain Vitals (HR and O2 on room air) stable and [facility]  throughout treatment session.    Home Living Family/patient expects to be discharged to:: Skilled nursing facility                 Additional Comments: Pt was at [facility] prior to surgery; has been at [facility] in [place] for rehab since    Prior Function Level of Independence: Independent with assistive device(s)         Comments: Pt reports being ambulatory with 4ww prior to surgery in June; pt reports recently being able to walk short distance with RW with  therapy.  Pt reports about 1 fall a month prior to June 6th 2022.     Hand Dominance        Extremity/Trunk Assessment   Upper Extremity Assessment Upper Extremity Assessment: Generalized weakness    Lower Extremity Assessment Lower Extremity Assessment: RLE deficits/detail;LLE deficits/detail RLE Deficits / Details: at least 3/5 AROM ankle DF/PF, good quad set isometrics, hip flexion at least 3/5 AROM; at least 3/5 AROM knee flexion/extension LLE Deficits / Details: at least 3/5 AROM ankle DF/PF, good quad set isometrics, hip flexion at least 3-/5 AROM; at least 3-/5 AROM knee flexion/extension    Cervical / Trunk Assessment Cervical / Trunk Assessment: Normal  Communication   Communication: No difficulties  Cognition Arousal/Alertness: Awake/alert Behavior During Therapy: WFL for tasks assessed/performed Overall Cognitive Status: Within Functional Limits for tasks assessed                                 General Comments: Oriented to person, place, and time.  Pt reports she is not sure why she is in the hospital but that her sons told her that she wasn't talking clearly.      General Comments  Nursing cleared pt for participation in physical therapy.  Pt agreeable to PT session.    Exercises     Assessment/Plan    PT Assessment Patient needs continued PT services  PT Problem List Decreased strength;Decreased activity tolerance;Decreased balance;Decreased mobility;Decreased knowledge of precautions       PT Treatment Interventions DME instruction;Gait training;Functional mobility training;Therapeutic activities;Therapeutic exercise;Balance training;Cognitive remediation;Patient/family education    PT Goals (Current goals can be found in the Care Plan section)  Acute Rehab PT Goals Patient Stated Goal: to be able to walk again PT Goal Formulation: With patient Time For Goal Achievement: 04/16/21 Potential to Achieve Goals: Good    Frequency Min  2X/week   Barriers to discharge Decreased caregiver support      Co-evaluation               AM-PAC PT "6 Clicks" Mobility  Outcome Measure Help needed turning from your back to your side while in a flat bed without using bedrails?: A Little Help needed moving from lying on your back to sitting on the side of a flat bed without using bedrails?: A Lot Help needed moving to and from a bed to a chair (including a wheelchair)?: Total Help needed standing up from a chair using your arms (e.g., wheelchair or bedside chair)?: Total Help needed to walk in hospital room?: Total Help needed climbing 3-5 steps with a railing? : Total 6 Click Score: 9    End of Session Equipment Utilized During Treatment: Gait belt Activity Tolerance: Patient tolerated treatment well Patient left: in bed;with call bell/phone within reach;with bed alarm set;Other (comment) (B heels floating via pillow support) Nurse Communication: Mobility status;Precautions;Other (  comment) (pt's spinal precautions) PT Visit Diagnosis: Other abnormalities of gait and mobility (R26.89);Muscle weakness (generalized) (M62.81);History of falling (Z91.81)    Time: 1856-3149 PT Time Calculation (min) (ACUTE ONLY): 45 min   Charges:   PT Evaluation $PT Eval Low Complexity: 1 Low PT Treatments $Therapeutic Activity: 23-37 mins       Hendricks Limes, PT 04/02/21, 4:53 PM

## 2021-04-02 NOTE — Progress Notes (Signed)
Initial Nutrition Assessment  DOCUMENTATION CODES:  Non-severe (moderate) malnutrition in context of chronic illness  INTERVENTION:  Downgrade diet to Dysphagia 3.  Add 30 ml Prostat po BID, each supplement provides 100 kcal and 15 grams of protein.   Add double protein portions to meals.  Add MVI with minerals daily.  NUTRITION DIAGNOSIS:  Moderate Malnutrition related to chronic illness as evidenced by percent weight loss, mild fat depletion, moderate fat depletion, mild muscle depletion, moderate muscle depletion.  GOAL:  Patient will meet greater than or equal to 90% of their needs  MONITOR:  PO intake, Supplement acceptance, Diet advancement, Labs, Weight trends, I & O's  REASON FOR ASSESSMENT:  Consult Assessment of nutrition requirement/status  ASSESSMENT:  75 yo female with a PMH of CAD, HTN, CVA, dyslipidemia, hypothyroidism, CKD stage 3, and anxiety/depression who presents with metabolic encephalopathy likely 2/2 acute UTI.  Spoke with pt at bedside. She reports that she has been eating a bit less at home due to some decreased appetite. Mentioned that with her teeth out, it is hard for her to chew sometimes. She also reports a mouth sore on her tongue, which hurts when she eats. This has been going on about 3 weeks now.  Pt endorses some weight loss, unsure of the amount and over how long. Per Epic, pt has lost ~28.5 lbs (18.4%) in the last 3 months, which is significant and severe for the time frame.  Pt does not like Ensure - reports that they are too sweet and does not like milky consistency either. Pt is open to ProSource and double proteins at meals.  Patient also will likely benefit from having her diet downgraded to Dysphagia 3 since her teeth are at home. RD to order this.  On exam, pt with mild to moderate depletions.  Medications: reviewed; OsCal, Vitamin D3, folic acid, Synthroid, Protonix, Klor-Con 40 mEq BID, Vitamin B12, NaCl @ 100 ml/hr  Labs:  reviewed; Na 131 (L), K 3 (L)  NUTRITION - FOCUSED PHYSICAL EXAM: Flowsheet Row Most Recent Value  Orbital Region Mild depletion  Upper Arm Region Mild depletion  Thoracic and Lumbar Region No depletion  Buccal Region Mild depletion  Temple Region Moderate depletion  Clavicle Bone Region Moderate depletion  Clavicle and Acromion Bone Region Moderate depletion  Scapular Bone Region Mild depletion  Dorsal Hand Moderate depletion  Patellar Region Moderate depletion  Anterior Thigh Region Moderate depletion  Posterior Calf Region Moderate depletion  Edema (RD Assessment) None  Hair Reviewed  Eyes Reviewed  Mouth Reviewed  Skin Reviewed  Nails Reviewed   Diet Order:   Diet Order             DIET DYS 3 Room service appropriate? Yes; Fluid consistency: Thin  Diet effective now                  EDUCATION NEEDS:  Education needs have been addressed  Skin:  Skin Assessment: Reviewed RN Assessment  Last BM:  unknown  Height:  Ht Readings from Last 1 Encounters:  04/01/21 5\' 2"  (1.575 m)   Weight:  Wt Readings from Last 1 Encounters:  04/01/21 57.2 kg   BMI:  Body mass index is 23.06 kg/m.  Estimated Nutritional Needs:  Kcal:  1850-2050 Protein:  75-90 grams Fluid:  >1.85 L  04/03/21, RD, LDN (she/her/hers) Registered Dietitian I After-Hours/Weekend Pager # in [place]

## 2021-04-02 NOTE — Progress Notes (Signed)
PROGRESS NOTE    Kimberly Cook  ZOX:[id number] DOB: March 22, 1946 DOA: 03/31/2021 PCP: Melonie Florida, FNP  Brief Narrative:  75 y.o. Caucasian female with medical history significant for coronary artery disease, hypertension, CVA, dyslipidemia, hypothyroidism and stage III chronic kidney disease, and anxiety and depression, who presented to the emergency room with acute onset of altered mental status with confusion as well as generalized weakness at her assisted living facility.  The patient admits to urinary frequency and urgency without dysuria or hematuria or flank pain.  Weakness and disorientation appear to improved after initiation of IV antibiotics for UTI   Assessment & Plan:   Active Problems:   Metabolic encephalopathy  Acute urinary tract infection Acute metabolic encephalopathy Altered mentation likely secondary to UTI Hyponatremia also possible cause Urine culture with Klebsiella pneumonia, sensitivities pending Plan: Continue IV Rocephin Continue IV fluids Follow sodium levels Frequent neurochecks Urine culture with Klebsiella, sensitivities pending Monitor vitals and fever curve  Hyponatremia Suspect hypovolemic hyponatremia Improved after IV fluids Decrease rate of fluids to 50 cc/h Continue UTI treatment as above  Essential hypertension Continue Cardizem CD, Cozaar, hydralazine per home meds Hold home hydrochlorothiazide  Hypothyroidism PTA Synthroid  Bilateral occipital neuralgia PTA Neurontin  Coronary artery disease Continue aspirin, beta-blocker, ARB   DVT prophylaxis: SQ Lovenox Code Status: DNR Family Communication:  Disposition Plan: Status is: Inpatient  Remains inpatient appropriate because:Inpatient level of care appropriate due to severity of illness  Dispo: The patient is from: ALF              Anticipated d/c is to: ALF              Patient currently is not medically stable to d/c.   Difficult to place patient No  Level of  care: Med-Surg  Consultants:  None  Procedures:  None  Antimicrobials:  Ceftriaxone   Subjective: Seen and examined.  Sitting comfortably in bed.  In good spirits.  Somewhat confused but stable  Objective: Vitals:   04/01/21 1528 04/01/21 1539 04/02/21 0337 04/02/21 1024  BP: (!) 85/70 105/74 116/82 94/76  Pulse: 94 94 100 89  Resp: 12 16 18 18   Temp: 97.8 F (36.6 C) 98.1 F (36.7 C) 98.4 F (36.9 C) 98.4 F (36.9 C)  TempSrc: Oral Oral Oral Oral  SpO2: 100% 93% 99% 98%  Weight:      Height:        Intake/Output Summary (Last 24 hours) at 04/02/2021 1256 Last data filed at 04/02/2021 0130 Gross per 24 hour  Intake 712.7 ml  Output 200 ml  Net 512.7 ml   Filed Weights   03/31/21 2055 04/01/21 0031  Weight: 59 kg 57.2 kg    Examination:  General exam: No acute distress.  Sitting up in bed. Respiratory system: Clear to auscultation. Respiratory effort normal. Cardiovascular system: S1 & S2 heard, RRR. No JVD, murmurs, rubs, gallops or clicks. No pedal edema. Gastrointestinal system: Soft, nondistended, nontender, normal bowel sounds  Central nervous system: Alert, oriented x2, no focal deficits Extremities: Symmetric 5 x 5 power. Skin: No rashes, lesions or ulcers Psychiatry: Judgement and insight appear normal. Mood & affect appropriate.     Data Reviewed: I have personally reviewed following labs and imaging studies  CBC: Recent Labs  Lab 03/31/21 1925 04/01/21 0556  WBC 8.4 5.5  NEUTROABS 6.8  --   HGB 15.0 12.7  HCT 41.8 34.5*  MCV 89.1 91.0  PLT 344 246   Basic Metabolic Panel: Recent Labs  Lab 03/31/21 1925 04/01/21 0556  NA 127* 131*  K 3.3* 3.0*  CL 87* 95*  CO2 26 26  GLUCOSE 130* 95  BUN 13 10  CREATININE 0.76 0.55  CALCIUM 9.0 8.2*   GFR: Estimated Creatinine Clearance: 47.3 mL/min (by C-G formula based on SCr of 0.55 mg/dL). Liver Function Tests: Recent Labs  Lab 03/31/21 1925  AST 32  ALT 19  ALKPHOS 102  BILITOT  1.2  PROT 7.3  ALBUMIN 3.1*   No results for input(s): LIPASE, AMYLASE in the last 168 hours. No results for input(s): AMMONIA in the last 168 hours. Coagulation Profile: Recent Labs  Lab 03/31/21 1925  INR 1.0   Cardiac Enzymes: No results for input(s): CKTOTAL, CKMB, CKMBINDEX, TROPONINI in the last 168 hours. BNP (last 3 results) No results for input(s): PROBNP in the last 8760 hours. HbA1C: No results for input(s): HGBA1C in the last 72 hours. CBG: No results for input(s): GLUCAP in the last 168 hours. Lipid Profile: No results for input(s): CHOL, HDL, LDLCALC, TRIG, CHOLHDL, LDLDIRECT in the last 72 hours. Thyroid Function Tests: No results for input(s): TSH, T4TOTAL, FREET4, T3FREE, THYROIDAB in the last 72 hours. Anemia Panel: No results for input(s): VITAMINB12, FOLATE, FERRITIN, TIBC, IRON, RETICCTPCT in the last 72 hours. Sepsis Labs: Recent Labs  Lab 03/31/21 2100  LATICACIDVEN 0.9    Recent Results (from the past 240 hour(s))  Urine Culture     Status: Abnormal (Preliminary result)   Collection Time: 03/31/21  6:37 PM   Specimen: Urine, Random  Result Value Ref Range Status   Specimen Description   Final    URINE, RANDOM Performed at [facility], [address], [place], [place] [address]    Special Requests   Final    NONE Performed at [facility], [address], [place], [place] [address]    Culture (A)  Final    >=100,000 COLONIES/mL KLEBSIELLA PNEUMONIAE SUSCEPTIBILITIES TO FOLLOW Performed at [facility], [address] [place], [place] [address]    Report Status PENDING  Incomplete  Blood Culture (routine x 2)     Status: None (Preliminary result)   Collection Time: 03/31/21  7:15 PM   Specimen: BLOOD  Result Value Ref Range Status   Specimen Description BLOOD LEFT ANTECUBITAL  Final   Special Requests   Final    BOTTLES DRAWN AEROBIC AND ANAEROBIC Blood Culture adequate volume   Culture   Final    NO GROWTH  2 DAYS Performed at [facility], [address], [place], [place] [address]    Report Status PENDING  Incomplete  Blood Culture (routine x 2)     Status: None (Preliminary result)   Collection Time: 03/31/21  7:19 PM   Specimen: BLOOD  Result Value Ref Range Status   Specimen Description BLOOD RIGHT ANTECUBITAL  Final   Special Requests   Final    BOTTLES DRAWN AEROBIC AND ANAEROBIC Blood Culture adequate volume   Culture   Final    NO GROWTH 2 DAYS Performed at [facility], [address], [place], [place] [address]    Report Status PENDING  Incomplete  Resp Panel by RT-PCR (Flu A&B, Covid) Nasopharyngeal Swab     Status: None   Collection Time: 03/31/21 11:05 PM   Specimen: Nasopharyngeal Swab; Nasopharyngeal(NP) swabs in vial transport medium  Result Value Ref Range Status   SARS Coronavirus 2 by RT PCR NEGATIVE NEGATIVE Final    Comment: (NOTE)  SARS-CoV-2 target nucleic acids are NOT DETECTED.  The SARS-CoV-2 RNA is generally detectable in upper respiratory specimens during the acute phase of infection. The lowest concentration of SARS-CoV-2 viral copies this assay can detect is 138 copies/mL. A negative result does not preclude SARS-Cov-2 infection and should not be used as the sole basis for treatment or other patient management decisions. A negative result may occur with  improper specimen collection/handling, submission of specimen other than nasopharyngeal swab, presence of viral mutation(s) within the areas targeted by this assay, and inadequate number of viral copies(<138 copies/mL). A negative result must be combined with clinical observations, patient history, and epidemiological information. The expected result is Negative.  Fact Sheet for Patients:  BloggerCourse.com  Fact Sheet for Healthcare Providers:  SeriousBroker.it  This test is no t yet approved or cleared by the [place] FDA  and  has been authorized for detection and/or diagnosis of SARS-CoV-2 by FDA under an Emergency Use Authorization (EUA). This EUA will remain  in effect (meaning this test can be used) for the duration of the COVID-19 declaration under Section 564(b)(1) of the Act, 21 U.S.C.section 360bbb-3(b)(1), unless the authorization is terminated  or revoked sooner.       Influenza A by PCR NEGATIVE NEGATIVE Final   Influenza B by PCR NEGATIVE NEGATIVE Final    Comment: (NOTE) The Xpert Xpress SARS-CoV-2/FLU/RSV plus assay is intended as an aid in the diagnosis of influenza from Nasopharyngeal swab specimens and should not be used as a sole basis for treatment. Nasal washings and aspirates are unacceptable for Xpert Xpress SARS-CoV-2/FLU/RSV testing.  Fact Sheet for Patients: BloggerCourse.com  Fact Sheet for Healthcare Providers: SeriousBroker.it  This test is not yet approved or cleared by the [place] FDA and has been authorized for detection and/or diagnosis of SARS-CoV-2 by FDA under an Emergency Use Authorization (EUA). This EUA will remain in effect (meaning this test can be used) for the duration of the COVID-19 declaration under Section 564(b)(1) of the Act, 21 U.S.C. section 360bbb-3(b)(1), unless the authorization is terminated or revoked.  Performed at [facility], [address] [place], [place] [address]          Radiology Studies: CT HEAD WO CONTRAST ( )  Result Date: 04/01/2021 CLINICAL DATA:  Mental status change. Unknown cause. Increased confusion, no trauma or fall. EXAM: CT HEAD WITHOUT CONTRAST TECHNIQUE: Contiguous axial images were obtained from the base of the skull through the vertex without intravenous contrast. COMPARISON:  CT head 06/30/2018 BRAIN: BRAIN Cerebral ventricle sizes are concordant with the degree of cerebral volume loss. Patchy and confluent areas of decreased attenuation are  noted throughout the deep and periventricular white matter of the cerebral hemispheres bilaterally, compatible with chronic microvascular ischemic disease. No evidence of large-territorial acute infarction. No parenchymal hemorrhage. No mass lesion. No extra-axial collection. No mass effect or midline shift. No hydrocephalus. Basilar cisterns are patent. Vascular: No hyperdense vessel. Atherosclerotic calcifications are present within the cavernous internal carotid arteries. Skull: No acute fracture or focal lesion. Sinuses/Orbits: Paranasal sinuses and mastoid air cells are clear. The orbits are unremarkable. Other: None. IMPRESSION: No acute intracranial abnormality. Electronically Signed   By: Tish Frederickson M.D.   On: 04/01/2021 16:30   DG Chest Port 1 View  Result Date: 03/31/2021 CLINICAL DATA:  Questionable sepsis.  Evaluate for abnormality. EXAM: PORTABLE CHEST 1 VIEW COMPARISON:  March 04, 2017 FINDINGS: No pneumothorax. Nodular density in the right apex may be associated with the nearby EKG lead.  The heart, hila, and mediastinum are unremarkable. The lungs are otherwise clear. Pedicle rods and screws seen in the thoracic and lumbar spine. IMPRESSION: 1. No evidence of pneumonia. 2. Nodular density over the right apex may be associated with the nearby EKG lead. Recommend a PA and lateral chest x-ray without EKG leads before discharge. Electronically Signed   By: Gerome Sam III M.D.   On: 03/31/2021 18:58        Scheduled Meds:  aspirin EC  81 mg Oral QHS   atorvastatin  20 mg Oral Daily   calcium carbonate  1 tablet Oral Q1200   cholecalciferol  5,000 Units Oral Q1200   diltiazem  60 mg Oral BID   enoxaparin (LOVENOX) injection  40 mg Subcutaneous QHS   escitalopram  5 mg Oral BH-q7a   folic acid  500 mcg Oral Q1200   gabapentin  600 mg Oral TID   levothyroxine  88 mcg Oral Once per day on Mon Tue Wed Thu Fri Sat   loratadine  10 mg Oral Daily   mirabegron ER  50 mg Oral Daily    montelukast  10 mg Oral QHS   pantoprazole  40 mg Oral Daily   potassium chloride SA  40 mEq Oral BID   traZODone  100 mg Oral Daily   vitamin B-12  1,000 mcg Oral Q1200   Continuous Infusions:  0.9 % NaCl with KCl 20 mEq / L 100 mL/hr at 04/01/21 2245   cefTRIAXone (ROCEPHIN)  IV 1 g (04/01/21 2246)     LOS: 2 days    Time spent: 25 minutes    Tresa Moore, MD [facility] Pager [phone]  If 7PM-7AM, please contact night-coverage 04/02/2021, 12:56 PM

## 2021-04-03 ENCOUNTER — Inpatient Hospital Stay: Payer: Medicare HMO

## 2021-04-03 DIAGNOSIS — E440 Moderate protein-calorie malnutrition: Secondary | ICD-10-CM | POA: Insufficient documentation

## 2021-04-03 LAB — URINE CULTURE: Culture: >=100000 — AB

## 2021-04-03 LAB — RESP PANEL BY RT-PCR (FLU A&B, COVID) ARPGX2
Influenza A by PCR: NEGATIVE
Influenza B by PCR: NEGATIVE
SARS Coronavirus 2 by RT PCR: NEGATIVE

## 2021-04-03 MED ORDER — POLYETHYLENE GLYCOL 3350 17 G PO PACK
17.0000 g | PACK | Freq: Every day | ORAL | Status: DC
  Administered 2021-04-03 – 2021-04-04 (×2): 17 g via ORAL
  Filled 2021-04-03 (×2): qty 1

## 2021-04-03 MED ORDER — FLEET ENEMA 7-19 GM/118ML RE ENEM: 1.0000 | ENEMA | Freq: Every day | RECTAL | Status: DC | PRN

## 2021-04-03 MED ORDER — SENNOSIDES-DOCUSATE SODIUM 8.6-50 MG PO TABS
1.0000 | ORAL_TABLET | Freq: Two times a day (BID) | ORAL | Status: DC
  Administered 2021-04-03 – 2021-04-04 (×3): 1 via ORAL
  Filled 2021-04-03 (×3): qty 1

## 2021-04-03 MED ORDER — CIPROFLOXACIN HCL 500 MG PO TABS: 500.0000 mg | ORAL_TABLET | Freq: Two times a day (BID) | ORAL | Status: DC

## 2021-04-03 NOTE — Care Management Important Message (Signed)
Important Message  Patient Details  Name: Kimberly Cook MRN: [id number] Date of Birth: 25-Jul-1946   Medicare Important Message Given:  Yes     [name] 04/03/2021, 11:08 AM

## 2021-04-03 NOTE — Progress Notes (Deleted)
04/04/2021 11:31 AM   Kimberly Cook November 04, 1945 [id number]  Referring provider: Virgie Dad, [address] [place],  [place] [address]  Urological history: 1. rUTI's -contributing factors of age, vaginal atrophy, poor perineal hygiene,  -documented positive urine culture over the last year  08/27/022 Klebsiella pneumoniae resistant to ampicillin  07/14/2020 e.coli resistant to ampicillin, ampicillin sulbactam, cefazolin, amoxicillin clavulanic acid, ceftriaxone, ciprofloxacin, levofloxacin and trimethoprim/sulfa  04/19/2020 e.coli resistant to ampicillin, ciprofloxacin and trimethoprim/sulfa  2. OAB -contributing factors of age, vaginal atrophy, obesity, depression, anxiety, stroke and arthritis -PVR *** -managed by Myrbetriq 50 mg daily   3. Renal cyst -CT renal stone study 2021 - 2.2 cm fluid attenuation cyst seen in the interpolar left kidney   No chief complaint on file.   HPI: Kimberly Cook is a 75 y.o. female who presents today for TOV  Catheter Removal  Patient is present today for a catheter removal.  ***ml of water was drained from the balloon. A ***FR foley cath was removed from the bladder {dnt complications:20057} . Patient tolerated well.  Performed by: ***   PMH: Past Medical History:  Diagnosis Date   Allergy    Anxiety    Arthritis    Back pain    Chronic kidney disease    STAGE 3   Coronary artery disease 04/2017   Mild to moderate CAD in LAD/diagonal by CTA (CT-FFR of apical LAD 0.79).   DDD (degenerative disc disease), lumbar    Depression    Diverticulosis    Essential hypertension    Normal cardiolite 05/2006 EF 71%   GERD (gastroesophageal reflux disease)    Headache    History of shingles    Hyperlipidemia    Hypothyroidism    Lung nodule    Mini stroke ([place]) 2011    Occipital neuralgia    Palpitations    Pneumonia 2018   Prediabetes    Stroke [facility])    Stroke ([place])    MRI 04/2008 + left sup. frontal  gyrus possibly puntate infarct    Syncope 2019   Urinary tract infection    Vitamin D deficiency     Surgical History: Past Surgical History:  Procedure Laterality Date   ABDOMINAL HYSTERECTOMY     BLADDER SURGERY     2003   BREAST EXCISIONAL BIOPSY Right Over 20 years    Benign   CHOLECYSTECTOMY     gastroplication      JOINT REPLACEMENT Left    KNEE   KNEE ARTHROSCOPY Left 2011   PULSE GENERATOR IMPLANT Left 01/31/2020   Procedure: PLACEMENT RIGHT FLANK PULSE GENERATOR VS REMOVAL SPINAL CORD STIMULATOR;  Surgeon: Deetta Perla, MD;  Location: [facility] ORS;  Service: Neurosurgery;  Laterality: Left;   PULSE GENERATOR IMPLANT Left 04/24/2020   Procedure: REPLACEMENT LEFT FLANK PULSE GENERATOR IMPLANT;  Surgeon: Deetta Perla, MD;  Location: [facility] ORS;  Service: Neurosurgery;  Laterality: Left;  MAC w/ local   right arm fracture     SPINAL CORD STIMULATOR REMOVAL N/A 06/26/2020   Procedure: SPINAL CORD STIMULATOR REMOVAL;  Surgeon: Deetta Perla, MD;  Location: [facility] ORS;  Service: Neurosurgery;  Laterality: N/A;   THORACIC LAMINECTOMY FOR SPINAL CORD STIMULATOR N/A 01/24/2020   Procedure: THORACIC SPINAL CORD STIMULATOR PADDLE TRIAL VIA LAMINECTOMY;  Surgeon: Deetta Perla, MD;  Location: [facility] ORS;  Service: Neurosurgery;  Laterality: N/A;   TONSILLECTOMY AND ADENOIDECTOMY     TOTAL KNEE ARTHROPLASTY Left 04/17/2015   Procedure: LEFT TOTAL KNEE  ARTHROPLASTY;  Surgeon: Paralee Cancel, MD;  Location: [facility];  Service: Orthopedics;  Laterality: Left;    Home Medications:  Allergies as of 04/04/2021   No Known Allergies      Medication List      Notice   This visit is during an admission. Changes to the med list made in this visit will be reflected in the After Visit Summary of the admission.     Allergies: No Known Allergies  Family History: Family History  Problem Relation Age of Onset   Heart disease Mother    Hypertension Mother    Diabetes Mother    Heart attack Mother 48    Heart disease Father    Heart attack Father 77   Breast cancer Maternal Aunt    Heart attack Brother     Social History:  reports that she is a non-smoker but has been exposed to tobacco smoke. She has never used smokeless tobacco. She reports that she does not drink alcohol and does not use drugs.  ROS: Pertinent ROS in HPI  Physical Exam: There were no vitals taken for this visit.  Constitutional:  Well nourished. Alert and oriented, No acute distress. HEENT: [place] AT, moist mucus membranes.  Trachea midline, no masses. Cardiovascular: No clubbing, cyanosis, or edema. Respiratory: Normal respiratory effort, no increased work of breathing. GI: Abdomen is soft, non tender, non distended, no abdominal masses. Liver and spleen not palpable.  No hernias appreciated.  Stool sample for occult testing is not indicated.   GU: No CVA tenderness.  No bladder fullness or masses.  *** external genitalia, *** pubic hair distribution, no lesions.  Normal urethral meatus, no lesions, no prolapse, no discharge.   No urethral masses, tenderness and/or tenderness. No bladder fullness, tenderness or masses. *** vagina mucosa, *** estrogen effect, no discharge, no lesions, *** pelvic support, *** cystocele and *** rectocele noted.  No cervical motion tenderness.  Uterus is freely mobile and non-fixed.  No adnexal/parametria masses or tenderness noted.  Anus and perineum are without rashes or lesions.   ***  Skin: No rashes, bruises or suspicious lesions. Lymph: No cervical or inguinal adenopathy. Neurologic: Grossly intact, no focal deficits, moving all 4 extremities. Psychiatric: Normal mood and affect.    Laboratory Data: Lab Results  Component Value Date   WBC 5.5 04/01/2021   HGB 12.7 04/01/2021   HCT 34.5 (L) 04/01/2021   MCV 91.0 04/01/2021   PLT 246 04/01/2021    Lab Results  Component Value Date   CREATININE 0.55 04/01/2021   Lab Results  Component Value Date   AST 32 03/31/2021   Lab  Results  Component Value Date   ALT 19 03/31/2021    Urinalysis    Component Value Date/Time   COLORURINE YELLOW (A) 03/31/2021 1919   APPEARANCEUR TURBID (A) 03/31/2021 1919   APPEARANCEUR Clear 11/30/2019 1340   LABSPEC 1.016 03/31/2021 1919   PHURINE 6.0 03/31/2021 1919   GLUCOSEU NEGATIVE 03/31/2021 1919   GLUCOSEU NEGATIVE 12/16/2017 1504   HGBUR SMALL (A) 03/31/2021 1919   BILIRUBINUR NEGATIVE 03/31/2021 1919   BILIRUBINUR Negative 11/30/2019 1340   KETONESUR 5 (A) 03/31/2021 1919   PROTEINUR 100 (A) 03/31/2021 1919   UROBILINOGEN 0.2 12/16/2017 1504   NITRITE NEGATIVE 03/31/2021 1919   LEUKOCYTESUR MODERATE (A) 03/31/2021 1919  I have reviewed the labs.   Pertinent Imaging: CLINICAL DATA:  Unspecified abdominal pain, chronic back pain worsening   EXAM: CT ABDOMEN AND PELVIS WITHOUT CONTRAST  TECHNIQUE: Multidetector CT imaging of the abdomen and pelvis was performed following the standard protocol without IV contrast.   COMPARISON:  CT Dec 26, 2009   FINDINGS: Lower chest: Lung bases are clear. Normal cardiac size. Trace pericardial fluid or thickening noted anteriorly.   Hepatobiliary: No focal liver abnormality is seen. Patient is post cholecystectomy. Slight prominence of the biliary tree likely related to reservoir effect. No calcified intraductal gallstones.   Pancreas: Partial fatty replacement of the pancreas. No pancreatic ductal dilatation or surrounding inflammatory changes.   Spleen: Spleen is at the upper limits of normal for size. Few punctate calcifications may reflect splenic granulomata.   Adrenals/Urinary Tract: Normal adrenal glands. 2.2 cm fluid attenuation cyst seen in the interpolar left kidney. No visible or contour deforming worrisome renal lesions are seen. No urolithiasis or hydronephrosis. Urinary bladder is circumferentially thickened with hazy perivesicular stranding though this may be reactive to the adjacent colonic  process detailed below.   Stomach/Bowel: There are postsurgical changes at the GE junction most compatible with an endoluminal gastroplication but with recurrent hiatal hernia at this time. Distal stomach and duodenal sweep are unremarkable. No small bowel dilatation or wall thickening. Small stump at the tip of the appendix may reflect prior appendectomy. Proximal colon is unremarkable. There is segmental thickening and pericolonic inflammation centered upon a culprit diverticulum in the mid sigmoid colon (2/71) surrounding inflammatory change and small amount of likely reactive free fluid is seen without organized collection or abscess or extraluminal gas.   Vascular/Lymphatic: Atherosclerotic plaque within the normal caliber aorta. No suspicious or enlarged lymph nodes in the included lymphatic chains.   Reproductive: Uterus is surgically absent. No concerning adnexal lesions.   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 Inflammatory changes in the left lower quadrant with trace reactive free fluid along the left pelvic sidewall. No free air. No bowel containing hernias.   Musculoskeletal: Multilevel degenerative changes are present in the imaged portions of the spine. Severe levocurvature of the lumbar spine apex L3 with compensatory dextrocurvature of the thoracolumbar spine. Interspinous arthrosis compatible with Baastrup's disease. Additional degenerative changes in the hips and SI joints and minimally at the symphysis pubis. No acute osseous abnormality or suspicious osseous lesion.   IMPRESSION: 1. Acute, uncomplicated sigmoid diverticulitis. 2. Circumferentially thickened urinary bladder with hazy perivesicular stranding may be reactive to the adjacent colonic process detailed below. Recommend correlation with urinalysis to exclude underlying  cystitis. 3. Postsurgical changes at the GE junction most compatible with an endoluminal gastroplication but with recurrent hiatal hernia at this time. 4. Geographic region mesenteric haze and reactive lymph nodes in the mid abdomen compatible with mesenteritis. 5. Multilevel degenerative changes throughout the imaged portions of the spine with associated Baastrup's disease. 6. Aortic Atherosclerosis (ICD10-I70.0). 7. Coronary artery disease and trace pericardial effusion/pericardial thickening. Finding is nonspecific.     Electronically Signed   By: Lovena Le M.D.   On: 10/19/2019 22:31 I have independently reviewed the films.  See HPI.    Assessment & Plan:  ***  1.   No follow-ups on file.  These notes generated with voice recognition software. I apologize for typographical errors.  Zara Council, PA-C  [facility] [address]  [place], [place] [address] [phone]

## 2021-04-03 NOTE — Progress Notes (Signed)
Physical Therapy Treatment Patient Details Name: Kimberly Cook MRN: [id number] DOB: 1945-11-14 Today's Date: 04/03/2021    History of Present Illness Pt is a 75 y.o. female presenting to hospital from [facility] (rehab for back issues) with AMS and fatigue.  Recent ED visit for oral lesions (dx with stomatitis).  Pt admitted with acute metabolic encephalopathy likely d/t UTI and hyponatremia, essential htn, hypothyroidism, B occipital neuralgia, and CAD.  Of note, pt s/p L1-L5 XLIF 01/12/21 and then T4-pelvis PSF 01/15/21 at outside hospital.  PMH includes anxiety, CKD, CAD, gastric reflux, HLD, CVA, TKR L.    PT Comments    Pt oriented to person and time but not situation or place today (pt appearing more confused today and also a little anxious--nurse notified).  Focused on LE ex's in bed to improve LE strength for functional mobility--pt tolerated LE ex's fairly well with assist as needed.  Will continue to focus on strengthening and progressive functional mobility per pt tolerance.    Follow Up Recommendations  SNF     Equipment Recommendations   (TBD at next venue of care)    Recommendations for Other Services OT consult     Precautions / Restrictions Precautions Precautions: Fall;Back Precaution Booklet Issued: No Precaution Comments: h/o L1-L5 XLIF 01/12/21 and T4-pelvis PSF 01/15/21 (pt reports Dr. Marcell Barlow said she did not have to wear TLSO brace anymore 2-3 weeks ago d/t brace causing her pain) Restrictions Weight Bearing Restrictions: No    Mobility  Bed Mobility                    Transfers                    Ambulation/Gait                 Stairs             Wheelchair Mobility    Modified Rankin (Stroke Patients Only)       Balance                                            Cognition Arousal/Alertness: Awake/alert Behavior During Therapy: Anxious Overall Cognitive Status: Impaired/Different  from baseline                                 General Comments: Pt oriented to person and time but not place and situation.      Exercises General Exercises - Lower Extremity Ankle Circles/Pumps: AROM;Strengthening;Both;Supine (10 reps x2) Quad Sets: AROM;Strengthening;Supine (10 reps x2) Gluteal Sets: AROM;Strengthening;Both;10 reps;Supine Short Arc Quad: AROM;Strengthening;Both;Supine (10 reps x2) Heel Slides: AAROM;Strengthening;Both;Supine (10 reps x2) Hip ABduction/ADduction: AAROM;Strengthening;Both;Supine (10 reps x2)    General Comments  Nursing cleared pt for participation in physical therapy.  Pt agreeable to PT session.      Pertinent Vitals/Pain Pain Assessment: No/denies pain Vitals (HR and O2 on room air) stable and WFL throughout treatment session.    Home Living                      Prior Function            PT Goals (current goals can now be found in the care plan section) Acute Rehab PT Goals Patient Stated Goal: to be able to  care for myself again PT Goal Formulation: With patient Time For Goal Achievement: 04/16/21 Potential to Achieve Goals: Good Progress towards PT goals: Progressing toward goals (with LE strengthening)    Frequency    Min 2X/week      PT Plan Current plan remains appropriate    Co-evaluation              AM-PAC PT "6 Clicks" Mobility   Outcome Measure    Help needed moving from lying on your back to sitting on the side of a flat bed without using bedrails?: A Lot Help needed moving to and from a bed to a chair (including a wheelchair)?: Total Help needed standing up from a chair using your arms (e.g., wheelchair or bedside chair)?: Total Help needed to walk in hospital room?: Total Help needed climbing 3-5 steps with a railing? : Total 6 Click Score: 6    End of Session Equipment Utilized During Treatment: Gait belt Activity Tolerance: Patient tolerated treatment well Patient left: in  bed;with call bell/phone within reach;with bed alarm set;Other (comment) (B heels floating via pillow support) Nurse Communication: Mobility status;Precautions;Other (comment) (pt appearing confused and anxious) PT Visit Diagnosis: Other abnormalities of gait and mobility (R26.89);Muscle weakness (generalized) (M62.81);History of falling (Z91.81)     Time: 8841-6606 PT Time Calculation (min) (ACUTE ONLY): 18 min  Charges:  $Therapeutic Exercise: 8-22 mins                     Hendricks Limes, PT 04/03/21, 6:21 PM

## 2021-04-03 NOTE — Progress Notes (Signed)
Chaplain Maggie made initial visit with pt at bedside. Pt expressed she is anxious to hear news of discharge from her doctor. Chaplain offered empathy, understanding, and encouragement. Patient agreed that prayer would be helpful and shared her favorite verse from [place], [place]. 8. Chaplain and patient joined in prayer. Continued support available per on call chaplain.

## 2021-04-03 NOTE — Progress Notes (Signed)
PROGRESS NOTE    Kimberly Cook  NKN:[id number] DOB: March 12, 1946 DOA: 03/31/2021 PCP: Melonie Florida, FNP  Brief Narrative:  75 y.o. Caucasian female with medical history significant for coronary artery disease, hypertension, CVA, dyslipidemia, hypothyroidism and stage III chronic kidney disease, and anxiety and depression, who presented to the emergency room with acute onset of altered mental status with confusion as well as generalized weakness at her assisted living facility.  The patient admits to urinary frequency and urgency without dysuria or hematuria or flank pain.  Weakness and disorientation appear to improved after initiation of IV antibiotics for UTI   Assessment & Plan:   Active Problems:   Metabolic encephalopathy   Malnutrition of moderate degree  Acute urinary tract infection Acute metabolic encephalopathy Altered mentation likely secondary to UTI Hyponatremia also possible cause Urine culture with Klebsiella pneumonia, pansensitive Plan: Discontinue Rocephin Patient completed 3-day course for uncomplicated cystitis No need to resume antibiotics Can discontinue IV fluids Patient medically stable for discharge at this time Need skilled nursing facility [facility] consulted Monitor vitals and fever curve  Hyponatremia Suspect hypovolemic hyponatremia Improved after IV fluids IV fluids discontinued  Essential hypertension Continue Cardizem CD, Cozaar, hydralazine per home meds Hold home hydrochlorothiazide Can likely resume on discharge  Hypothyroidism PTA Synthroid  Bilateral occipital neuralgia PTA Neurontin  Coronary artery disease Continue aspirin, beta-blocker, ARB   DVT prophylaxis: SQ Lovenox Code Status: DNR Family Communication:  Disposition Plan: Status is: Inpatient  Remains inpatient appropriate because:Inpatient level of care appropriate due to severity of illness  Dispo: The patient is from: ALF              Anticipated d/c is to:  Skilled nursing facility              Patient currently is not medically stable to d/c.   Difficult to place patient No  Patient is medically stable for discharge at this time.  Pending insurance authorization for discharge to skilled nursing facility.  Level of care: Med-Surg  Consultants:  None  Procedures:  None  Antimicrobials:    Subjective: Seen and examined.  Sitting comfortably in bed.  In good spirits.  Somewhat confused but stable  Objective: Vitals:   04/02/21 1800 04/02/21 2035 04/03/21 0439 04/03/21 0827  BP: 130/77 (!) 124/91 108/75 98/67  Pulse: 90 94 93 87  Resp: 16 15 16 16   Temp: 98.1 F (36.7 C) 98.3 F (36.8 C) 98.7 F (37.1 C) 97.9 F (36.6 C)  TempSrc: Oral   Oral  SpO2: 100% 100%  100%  Weight:      Height:        Intake/Output Summary (Last 24 hours) at 04/03/2021 1400 Last data filed at 04/03/2021 0500 Gross per 24 hour  Intake --  Output 800 ml  Net -800 ml   Filed Weights   03/31/21 2055 04/01/21 0031  Weight: 59 kg 57.2 kg    Examination:  General exam: No acute distress Respiratory system: Clear to auscultation. Respiratory effort normal. Cardiovascular system: S1-S2, regular rate and rhythm, no murmurs Gastrointestinal system: Soft, nondistended, nontender, normal bowel sounds  Central nervous system: Alert, oriented x2, no focal deficits Extremities: Symmetric 5 x 5 power. Skin: No rashes, lesions or ulcers Psychiatry: Judgement and insight appear normal. Mood & affect appropriate.     Data Reviewed: I have personally reviewed following labs and imaging studies  CBC: Recent Labs  Lab 03/31/21 1925 04/01/21 0556  WBC 8.4 5.5  NEUTROABS 6.8  --  HGB 15.0 12.7  HCT 41.8 34.5*  MCV 89.1 91.0  PLT 344 246   Basic Metabolic Panel: Recent Labs  Lab 03/31/21 1925 04/01/21 0556  NA 127* 131*  K 3.3* 3.0*  CL 87* 95*  CO2 26 26  GLUCOSE 130* 95  BUN 13 10  CREATININE 0.76 0.55  CALCIUM 9.0 8.2*    GFR: Estimated Creatinine Clearance: 47.3 mL/min (by C-G formula based on SCr of 0.55 mg/dL). Liver Function Tests: Recent Labs  Lab 03/31/21 1925  AST 32  ALT 19  ALKPHOS 102  BILITOT 1.2  PROT 7.3  ALBUMIN 3.1*   No results for input(s): LIPASE, AMYLASE in the last 168 hours. No results for input(s): AMMONIA in the last 168 hours. Coagulation Profile: Recent Labs  Lab 03/31/21 1925  INR 1.0   Cardiac Enzymes: No results for input(s): CKTOTAL, CKMB, CKMBINDEX, TROPONINI in the last 168 hours. BNP (last 3 results) No results for input(s): PROBNP in the last 8760 hours. HbA1C: No results for input(s): HGBA1C in the last 72 hours. CBG: No results for input(s): GLUCAP in the last 168 hours. Lipid Profile: No results for input(s): CHOL, HDL, LDLCALC, TRIG, CHOLHDL, LDLDIRECT in the last 72 hours. Thyroid Function Tests: No results for input(s): TSH, T4TOTAL, FREET4, T3FREE, THYROIDAB in the last 72 hours. Anemia Panel: No results for input(s): VITAMINB12, FOLATE, FERRITIN, TIBC, IRON, RETICCTPCT in the last 72 hours. Sepsis Labs: Recent Labs  Lab 03/31/21 2100  LATICACIDVEN 0.9    Recent Results (from the past 240 hour(s))  Urine Culture     Status: Abnormal   Collection Time: 03/31/21  6:37 PM   Specimen: Urine, Random  Result Value Ref Range Status   Specimen Description   Final    URINE, RANDOM Performed at [facility], [address] [place], [place] [address]    Special Requests   Final    NONE Performed at [facility], [address] [place], [place] [address]    Culture >=100,000 COLONIES/mL KLEBSIELLA PNEUMONIAE (A)  Final   Report Status 04/03/2021 FINAL  Final   Organism ID, Bacteria KLEBSIELLA PNEUMONIAE (A)  Final      Susceptibility   Klebsiella pneumoniae - MIC*    AMPICILLIN RESISTANT Resistant     CEFAZOLIN <=4 SENSITIVE Sensitive     CEFEPIME <=0.12 SENSITIVE Sensitive     CEFTRIAXONE <=0.25 SENSITIVE Sensitive      CIPROFLOXACIN <=0.25 SENSITIVE Sensitive     GENTAMICIN <=1 SENSITIVE Sensitive     IMIPENEM <=0.25 SENSITIVE Sensitive     NITROFURANTOIN 64 INTERMEDIATE Intermediate     TRIMETH/SULFA <=20 SENSITIVE Sensitive     AMPICILLIN/SULBACTAM 4 SENSITIVE Sensitive     PIP/TAZO <=4 SENSITIVE Sensitive     * >=100,000 COLONIES/mL KLEBSIELLA PNEUMONIAE  Blood Culture (routine x 2)     Status: None (Preliminary result)   Collection Time: 03/31/21  7:15 PM   Specimen: BLOOD  Result Value Ref Range Status   Specimen Description BLOOD LEFT ANTECUBITAL  Final   Special Requests   Final    BOTTLES DRAWN AEROBIC AND ANAEROBIC Blood Culture adequate volume   Culture   Final    NO GROWTH 3 DAYS Performed at [facility], [address], [place], [place] [address]    Report Status PENDING  Incomplete  Blood Culture (routine x 2)     Status: None (Preliminary result)   Collection Time: 03/31/21  7:19 PM   Specimen: BLOOD  Result Value Ref  Range Status   Specimen Description BLOOD RIGHT ANTECUBITAL  Final   Special Requests   Final    BOTTLES DRAWN AEROBIC AND ANAEROBIC Blood Culture adequate volume   Culture   Final    NO GROWTH 3 DAYS Performed at [facility], [address] [place], [place] [address]    Report Status PENDING  Incomplete  Resp Panel by RT-PCR (Flu A&B, Covid) Nasopharyngeal Swab     Status: None   Collection Time: 03/31/21 11:05 PM   Specimen: Nasopharyngeal Swab; Nasopharyngeal(NP) swabs in vial transport medium  Result Value Ref Range Status   SARS Coronavirus 2 by RT PCR NEGATIVE NEGATIVE Final    Comment: (NOTE) SARS-CoV-2 target nucleic acids are NOT DETECTED.  The SARS-CoV-2 RNA is generally detectable in upper respiratory specimens during the acute phase of infection. The lowest concentration of SARS-CoV-2 viral copies this assay can detect is 138 copies/mL. A negative result does not preclude SARS-Cov-2 infection and should not be used as the  sole basis for treatment or other patient management decisions. A negative result may occur with  improper specimen collection/handling, submission of specimen other than nasopharyngeal swab, presence of viral mutation(s) within the areas targeted by this assay, and inadequate number of viral copies(<138 copies/mL). A negative result must be combined with clinical observations, patient history, and epidemiological information. The expected result is Negative.  Fact Sheet for Patients:  BloggerCourse.com  Fact Sheet for Healthcare Providers:  SeriousBroker.it  This test is no t yet approved or cleared by the [place] FDA and  has been authorized for detection and/or diagnosis of SARS-CoV-2 by FDA under an Emergency Use Authorization (EUA). This EUA will remain  in effect (meaning this test can be used) for the duration of the COVID-19 declaration under Section 564(b)(1) of the Act, 21 U.S.C.section 360bbb-3(b)(1), unless the authorization is terminated  or revoked sooner.       Influenza A by PCR NEGATIVE NEGATIVE Final   Influenza B by PCR NEGATIVE NEGATIVE Final    Comment: (NOTE) The Xpert Xpress SARS-CoV-2/FLU/RSV plus assay is intended as an aid in the diagnosis of influenza from Nasopharyngeal swab specimens and should not be used as a sole basis for treatment. Nasal washings and aspirates are unacceptable for Xpert Xpress SARS-CoV-2/FLU/RSV testing.  Fact Sheet for Patients: BloggerCourse.com  Fact Sheet for Healthcare Providers: SeriousBroker.it  This test is not yet approved or cleared by the [place] FDA and has been authorized for detection and/or diagnosis of SARS-CoV-2 by FDA under an Emergency Use Authorization (EUA). This EUA will remain in effect (meaning this test can be used) for the duration of the COVID-19 declaration under Section 564(b)(1) of the  Act, 21 U.S.C. section 360bbb-3(b)(1), unless the authorization is terminated or revoked.  Performed at [facility], [address] [place], [place] [address]          Radiology Studies: DG Abd 1 View  Result Date: 04/03/2021 CLINICAL DATA:  History of constipation EXAM: ABDOMEN - 1 VIEW COMPARISON:  None. FINDINGS: Multilevel fusion and posterior fixation are seen throughout thoracic spine. Scattered large and small bowel gas is noted. No obstructive changes are seen. No findings to suggest constipation are noted. IMPRESSION: Postsurgical changes.  No acute abnormality noted. Electronically Signed   By: Alcide Clever M.D.   On: 04/03/2021 12:35   CT HEAD WO CONTRAST ( )  Result Date: 04/01/2021 CLINICAL DATA:  Mental status change. Unknown cause. Increased confusion, no trauma or fall. EXAM: CT  HEAD WITHOUT CONTRAST TECHNIQUE: Contiguous axial images were obtained from the base of the skull through the vertex without intravenous contrast. COMPARISON:  CT head 06/30/2018 BRAIN: BRAIN Cerebral ventricle sizes are concordant with the degree of cerebral volume loss. Patchy and confluent areas of decreased attenuation are noted throughout the deep and periventricular white matter of the cerebral hemispheres bilaterally, compatible with chronic microvascular ischemic disease. No evidence of large-territorial acute infarction. No parenchymal hemorrhage. No mass lesion. No extra-axial collection. No mass effect or midline shift. No hydrocephalus. Basilar cisterns are patent. Vascular: No hyperdense vessel. Atherosclerotic calcifications are present within the cavernous internal carotid arteries. Skull: No acute fracture or focal lesion. Sinuses/Orbits: Paranasal sinuses and mastoid air cells are clear. The orbits are unremarkable. Other: None. IMPRESSION: No acute intracranial abnormality. Electronically Signed   By: Tish Frederickson M.D.   On: 04/01/2021 16:30        Scheduled Meds:   (feeding supplement) PROSource Plus  30 mL Oral BID BM   aspirin EC  81 mg Oral QHS   atorvastatin  20 mg Oral Daily   calcium carbonate  1 tablet Oral Q1200   cholecalciferol  5,000 Units Oral Q1200   diltiazem  60 mg Oral BID   enoxaparin (LOVENOX) injection  40 mg Subcutaneous QHS   escitalopram  5 mg Oral BH-q7a   folic acid  500 mcg Oral Q1200   gabapentin  600 mg Oral TID   levothyroxine  88 mcg Oral Once per day on Mon Tue Wed Thu Fri Sat   loratadine  10 mg Oral Daily   mirabegron ER  50 mg Oral Daily   montelukast  10 mg Oral QHS   multivitamin with minerals  1 tablet Oral Daily   pantoprazole  40 mg Oral Daily   polyethylene glycol  17 g Oral Daily   potassium chloride SA  40 mEq Oral BID   senna-docusate  1 tablet Oral BID   traZODone  100 mg Oral Daily   vitamin B-12  1,000 mcg Oral Q1200   Continuous Infusions:     LOS: 3 days    Time spent: 25 minutes    Tresa Moore, MD [facility] Pager [phone]  If 7PM-7AM, please contact night-coverage 04/03/2021, 2:00 PM

## 2021-04-03 NOTE — Evaluation (Signed)
Occupational Therapy Evaluation Patient Details Name: Kimberly Cook MRN: [id number] DOB: 12/01/1945 Today's Date: 04/03/2021    History of Present Illness Pt is a 75 y.o. female presenting to hospital from [facility] (rehab for back issues) with AMS and fatigue.  Recent ED visit for oral lesions (dx with stomatitis).  Pt admitted with acute metabolic encephalopathy likely d/t UTI and hyponatremia, essential htn, hypothyroidism, B occipital neuralgia, and CAD.  Of note, pt s/p L1-L5 XLIF 01/12/21 and then T4-pelvis PSF 01/15/21 at outside hospital.  PMH includes anxiety, CKD, CAD, gastric reflux, HLD, CVA, TKR L.   Clinical Impression   Pt seen for OT evaluation this date.  Pt with recent back surgery in June 2022 and was at [facility] for rehab.  She was admitted to the hospital for metabolic encephalopathy and UTI with confusion.  Prior to [facility] she lived at [facility].  She presents with continued confusion, muscle weakness, decreased transfers, decreased ability to stand or ambulate and decreased ability to perform basic self care tasks.  She would benefit from skilled OT services to maximize her safety and independence in necessary daily tasks.  Recommend SNF upon discharge for continued rehab.      Follow Up Recommendations  SNF    Equipment Recommendations       Recommendations for Other Services       Precautions / Restrictions Precautions Precautions: Fall;Back Precaution Booklet Issued: No Precaution Comments: h/o L1-L5 XLIF 01/12/21 and T4-pelvis PSF 01/15/21 (pt reports Dr. Marcell Barlow said she did not have to wear TLSO brace anymore 2-3 weeks ago d/t brace causing her pain)      Mobility Bed Mobility Overal bed mobility: Needs Assistance Bed Mobility: Rolling;Sidelying to Sit;Sit to Sidelying Rolling: Min assist Sidelying to sit: Mod assist;HOB elevated     Sit to sidelying: Mod assist;HOB elevated      Transfers Overall transfer level: Needs  assistance               General transfer comment: Unable to stand this date, required +2 to reposition in bed    Balance Overall balance assessment: Needs assistance Sitting-balance support: No upper extremity supported;Feet supported Sitting balance-Leahy Scale: Poor Sitting balance - Comments: pt with intermittent posterior lean in sitting requiring close SBA to min assist for safety/balance       Standing balance comment: unable to stand to assess                           ADL either performed or assessed with clinical judgement   ADL Overall ADL's : Needs assistance/impaired   Eating/Feeding Details (indicate cue type and reason): Pt reports mouth sore for the last week and has not been able to eat.  Her breakfast tray untouched.  NA came in during session and will get pt some broth and gingle ale per pt request. She reports she cannot wear her dentures, cannot chew presently. Grooming: Bed level;Set up   Upper Body Bathing: Minimal assistance   Lower Body Bathing: Maximal assistance   Upper Body Dressing : Minimal assistance   Lower Body Dressing: Maximal assistance   Toilet Transfer: +2 for physical assistance             General ADL Comments: Pt requiring increased assistance for basic self care tasks.  Transfers required +2 and was unable to stand today and required assistance     Vision  Perception     Praxis      Pertinent Vitals/Pain Pain Assessment: No/denies pain     Hand Dominance Right   Extremity/Trunk Assessment Upper Extremity Assessment Upper Extremity Assessment: Generalized weakness   Lower Extremity Assessment Lower Extremity Assessment: Defer to PT evaluation   Cervical / Trunk Assessment Cervical / Trunk Assessment: Normal   Communication Communication Communication: No difficulties   Cognition Arousal/Alertness: Awake/alert Behavior During Therapy: WFL for tasks assessed/performed Overall Cognitive  Status: Within Functional Limits for tasks assessed                                 General Comments: Pt reports she still feels confused, she is oriented to person and time but unsure of place and details of situation.  [place] [facility] before going to rehab.   General Comments   Pt unable to maintain sitting at EOB for participation in dressing skills or grooming without support.  Grooming in bed with setup.     Exercises     Shoulder Instructions      Home Living Family/patient expects to be discharged to:: Skilled nursing facility                                 Additional Comments: Pt was at [facility] prior to surgery; has been at [facility] in [place] for rehab since      Prior Functioning/Environment Level of Independence: Independent with assistive device(s)        Comments: Pt reports being ambulatory with [facility]ww prior to surgery in June; pt reports recently being able to walk short distance with RW with therapy.  Pt reports about 1 fall a month prior to June 6th 2022.  Reports she was independent with self care prior to her back surgery but required increased assist since having surgery.        OT Problem List: Decreased strength;Decreased knowledge of use of DME or AE;Decreased range of motion;Decreased activity tolerance;Decreased cognition;Impaired balance (sitting and/or standing)      OT Treatment/Interventions: Self-care/ADL training;Therapeutic exercise;Patient/family education;Balance training;Therapeutic activities;DME and/or AE instruction;Cognitive remediation/compensation    OT Goals(Current goals can be found in the care plan section) Acute Rehab OT Goals Patient Stated Goal: to be able to care for myself again OT Goal Formulation: With patient Time For Goal Achievement: 04/17/21 Potential to Achieve Goals: Fair ADL Goals Pt Will Perform Lower Body Dressing: with min  assist;with mod assist Pt Will Transfer to Toilet: with mod assist  OT Frequency: Min 2X/week   Barriers to D/C:            Co-evaluation              AM-PAC OT "6 Clicks" Daily Activity     Outcome Measure Help from another person eating meals?: None Help from another person taking care of personal grooming?: A Little Help from another person toileting, which includes using toliet, bedpan, or urinal?: Total Help from another person bathing (including washing, rinsing, drying)?: A Lot Help from another person to put on and taking off regular upper body clothing?: A Little Help from another person to put on and taking off regular lower body clothing?: A Lot 6 Click Score: 15   End of Session Nurse Communication: Other (comment) (difficulty with food consistency, did not eat breakfast)  Activity Tolerance: Patient tolerated treatment well Patient left: in bed;with call bell/phone within reach;with bed alarm set  OT Visit Diagnosis: Muscle weakness (generalized) (M62.81);History of falling (Z91.81);Unsteadiness on feet (R26.81)                Time: 1898-4210 OT Time Calculation (min): 35 min Charges:  OT General Charges $OT Visit: 1 Visit OT Evaluation $OT Eval Low Complexity: 1 Low OT Treatments $Self Care/Home Management : 8-22 mins  Tansy Lorek T Blakeley Margraf, OTR/L, CLT   Analiyah Lechuga 04/03/2021, 10:05 AM

## 2021-04-03 NOTE — TOC Progression Note (Addendum)
Transition of Care [facility]) - Progression Note    Patient Details  Name: Kimberly Cook MRN: [id number] Date of Birth: 1946/03/19  Transition of Care [facility]) CM/SW Contact  Chapman Fitch, RN Phone Number: 04/03/2021, 12:11 PM  Clinical Narrative:     Patient not managed by Navi.  Message left for Debra at [facility] to start authorization   115 pm - Stanton Kidney confirms she will start auth. MD to order covid test   Expected Discharge Plan: Skilled Nursing Facility Barriers to Discharge: Continued Medical Work up  Expected Discharge Plan and Services Expected Discharge Plan: Skilled Nursing Facility       Living arrangements for the past 2 months: Skilled Nursing Facility                                       Social Determinants of Health (SDOH) Interventions    Readmission Risk Interventions No flowsheet data found.

## 2021-04-04 ENCOUNTER — Ambulatory Visit: Payer: Medicare HMO | Admitting: Urology

## 2021-04-04 DIAGNOSIS — D7389 Other diseases of spleen: Secondary | ICD-10-CM | POA: Diagnosis not present

## 2021-04-04 DIAGNOSIS — R2681 Unsteadiness on feet: Secondary | ICD-10-CM | POA: Diagnosis not present

## 2021-04-04 DIAGNOSIS — E119 Type 2 diabetes mellitus without complications: Secondary | ICD-10-CM | POA: Diagnosis not present

## 2021-04-04 DIAGNOSIS — F330 Major depressive disorder, recurrent, mild: Secondary | ICD-10-CM | POA: Diagnosis not present

## 2021-04-04 DIAGNOSIS — Z981 Arthrodesis status: Secondary | ICD-10-CM | POA: Diagnosis not present

## 2021-04-04 DIAGNOSIS — Z79899 Other long term (current) drug therapy: Secondary | ICD-10-CM | POA: Diagnosis not present

## 2021-04-04 DIAGNOSIS — G309 Alzheimer's disease, unspecified: Secondary | ICD-10-CM | POA: Diagnosis not present

## 2021-04-04 DIAGNOSIS — M419 Scoliosis, unspecified: Secondary | ICD-10-CM | POA: Diagnosis not present

## 2021-04-04 DIAGNOSIS — K5900 Constipation, unspecified: Secondary | ICD-10-CM | POA: Diagnosis not present

## 2021-04-04 DIAGNOSIS — N390 Urinary tract infection, site not specified: Secondary | ICD-10-CM | POA: Diagnosis not present

## 2021-04-04 DIAGNOSIS — D649 Anemia, unspecified: Secondary | ICD-10-CM | POA: Diagnosis not present

## 2021-04-04 DIAGNOSIS — G9341 Metabolic encephalopathy: Secondary | ICD-10-CM | POA: Diagnosis not present

## 2021-04-04 DIAGNOSIS — I2510 Atherosclerotic heart disease of native coronary artery without angina pectoris: Secondary | ICD-10-CM | POA: Diagnosis not present

## 2021-04-04 DIAGNOSIS — I10 Essential (primary) hypertension: Secondary | ICD-10-CM | POA: Diagnosis not present

## 2021-04-04 DIAGNOSIS — M4326 Fusion of spine, lumbar region: Secondary | ICD-10-CM | POA: Diagnosis not present

## 2021-04-04 DIAGNOSIS — M810 Age-related osteoporosis without current pathological fracture: Secondary | ICD-10-CM | POA: Diagnosis not present

## 2021-04-04 DIAGNOSIS — M5459 Other low back pain: Secondary | ICD-10-CM | POA: Diagnosis not present

## 2021-04-04 DIAGNOSIS — N1831 Chronic kidney disease, stage 3a: Secondary | ICD-10-CM | POA: Diagnosis not present

## 2021-04-04 DIAGNOSIS — R339 Retention of urine, unspecified: Secondary | ICD-10-CM

## 2021-04-04 LAB — BASIC METABOLIC PANEL
Anion gap: 4 — ABNORMAL LOW (ref 5–15)
BUN: 6 mg/dL — ABNORMAL LOW (ref 8–23)
CO2: 25 mmol/L (ref 22–32)
Calcium: 8.4 mg/dL — ABNORMAL LOW (ref 8.9–10.3)
Chloride: 105 mmol/L (ref 98–111)
Creatinine, Ser: 0.52 mg/dL (ref 0.44–1.00)
GFR, Estimated: >60 mL/min (ref >60)
Glucose, Bld: 116 mg/dL — ABNORMAL HIGH (ref 70–99)
Potassium: 4.5 mmol/L (ref 3.5–5.1)
Sodium: 134 mmol/L — ABNORMAL LOW (ref 135–145)

## 2021-04-04 LAB — CBC
HCT: 34.9 % — ABNORMAL LOW (ref 36.0–46.0)
Hemoglobin: 12 g/dL (ref 12.0–15.0)
MCH: 32.2 pg (ref 26.0–34.0)
MCHC: 34.4 g/dL (ref 30.0–36.0)
MCV: 93.6 fL (ref 80.0–100.0)
Platelets: 202 10*3/uL (ref 150–400)
RBC: 3.73 MIL/uL — ABNORMAL LOW (ref 3.87–5.11)
RDW: 13.6 % (ref 11.5–15.5)
WBC: 4.6 10*3/uL (ref 4.0–10.5)
nRBC: 0 % (ref 0.0–0.2)

## 2021-04-04 LAB — MAGNESIUM: Magnesium: 1.6 mg/dL — ABNORMAL LOW (ref 1.7–2.4)

## 2021-04-04 LAB — PHOSPHORUS: Phosphorus: 2.4 mg/dL — ABNORMAL LOW (ref 2.5–4.6)

## 2021-04-04 MED ORDER — QUETIAPINE FUMARATE 25 MG PO TABS: 12.5000 mg | ORAL_TABLET | Freq: Every day | ORAL | Status: DC

## 2021-04-04 MED ORDER — LORAZEPAM 0.5 MG PO TABS: 0.5000 mg | ORAL_TABLET | Freq: Two times a day (BID) | ORAL | 0 refills | Status: DC | PRN

## 2021-04-04 MED ORDER — K PHOS MONO-SOD PHOS DI & MONO 155-852-130 MG PO TABS
500.0000 mg | ORAL_TABLET | Freq: Every day | ORAL | Status: DC
  Administered 2021-04-04: 500 mg via ORAL
  Filled 2021-04-04: qty 2

## 2021-04-04 MED ORDER — MAGNESIUM OXIDE -MG SUPPLEMENT 400 (240 MG) MG PO TABS
400.0000 mg | ORAL_TABLET | Freq: Two times a day (BID) | ORAL | Status: DC
  Administered 2021-04-04: 400 mg via ORAL
  Filled 2021-04-04: qty 1

## 2021-04-04 MED ORDER — MAGNESIUM OXIDE -MG SUPPLEMENT 400 (240 MG) MG PO TABS: 400.0000 mg | ORAL_TABLET | Freq: Two times a day (BID) | ORAL | 0 refills | Status: AC

## 2021-04-04 MED ORDER — MELATONIN 5 MG PO TABS: 2.5000 mg | ORAL_TABLET | Freq: Every day | ORAL | Status: DC

## 2021-04-04 MED ORDER — MELATONIN 5 MG PO TABS: 5.0000 mg | ORAL_TABLET | Freq: Every day | ORAL | 0 refills | Status: DC

## 2021-04-04 MED ORDER — SODIUM CHLORIDE 0.9 % IV SOLN
1.0000 g | INTRAVENOUS | Status: DC
  Filled 2021-04-04: qty 10

## 2021-04-04 NOTE — TOC Progression Note (Signed)
Transition of Care [facility]) - Progression Note    Patient Details  Name: Kimberly Cook MRN: [id number] Date of Birth: 04-25-1946  Transition of Care [facility]) CM/SW Contact  Chapman Fitch, RN Phone Number: 04/04/2021, 10:26 AM  Clinical Narrative:     Notified by Kimberly Cook at [facility] that Berkley Harvey has been obtained  and they can accept patient today MD notified.  Awaiting lab results to determine if patient will be discharged   Expected Discharge Plan: Skilled Nursing Facility Barriers to Discharge: Continued Medical Work up  Expected Discharge Plan and Services Expected Discharge Plan: Skilled Nursing Facility       Living arrangements for the past 2 months: Skilled Nursing Facility                                       Social Determinants of Health (SDOH) Interventions    Readmission Risk Interventions No flowsheet data found.

## 2021-04-04 NOTE — Discharge Summary (Signed)
[facility] Discharge Summary   Patient: Kimberly Cook LOV:[id number]  PCP: Melonie [place], FNP  Date of admission: 03/31/2021   Date of discharge:  04/04/2021     Discharge Diagnoses:  Principal diagnosis UTI Active Problems:   Metabolic encephalopathy   Malnutrition of moderate degree   Admitted From: ALF Disposition:  SNF   Recommendations for Outpatient Follow-up:  PCP: in 1 wk Follow up LABS/TEST: Monitor BP and titrate medications accordingly, repeat CBC BMP, mag and phosphorus after 1 week.   Diet recommendation: Cardiac diet  Activity: The patient is advised to gradually reintroduce usual activities, as tolerated  Discharge Condition: stable  Code Status: DNR   History of present illness: As per the H and P dictated on admission 75 y.o. Caucasian female with medical history significant for coronary artery disease, hypertension, CVA, dyslipidemia, hypothyroidism and stage III chronic kidney disease, and anxiety and depression, who presented to the emergency room with acute onset of altered mental status with confusion as well as generalized weakness at her assisted living facility.  The patient admits to urinary frequency and urgency without dysuria or hematuria or flank pain. Weakness and disorientation appear to improved after initiation of IV antibiotics for UTI Hospital Course:  Assessment & Plan: Active Problems:   Metabolic encephalopathy   Malnutrition of moderate degree # Acute urinary tract infection and Acute metabolic encephalopathy Altered mentation likely secondary to UTI, Hyponatremia also possible cause. Urine culture with Klebsiella pneumonia, pansensitive. S/p Rocephin, completed 3-day course for uncomplicated cystitis. No need to resume antibiotics. S/p IV fluids. Patient medically stable for discharge at this time. Need skilled nursing facility. TOC consulted and received insurance authorization.   #Hyponatremia, Suspect hypovolemic  hyponatremia, Improved after IV fluids, Na 134 # Essential hypertension, Continue Cardizem CD, d/c'd Cozaar, hydralazine and HCTZ due to soft BP.  Monitor BP and resume medications accordingly. # Hypothyroidism, PTA Synthroid # Bilateral occipital neuralgia, PTA Neurontin # Coronary artery disease, Continue aspirin, and Cardizem, discontinued Cozaar, monitor BP and titrate medications accordingly. Body mass index is 23.06 kg/m.  Nutrition Problem: Moderate Malnutrition Etiology: chronic illness Nutrition Interventions: Interventions: Prostat, Other (Comment), Liberalize Diet, MVI (Double protein)   - Patient was instructed, not to drive, operate heavy machinery, perform activities at heights, swimming or participation in water activities or provide baby sitting services while on Pain, Sleep and Anxiety Medications; until her outpatient Physician has advised to do so again.  - Also recommended to not to take more than prescribed Pain, Sleep and Anxiety Medications.  Patient was seen by physical therapy, who recommended SNF, which was arranged. On the day of the discharge the patient's vitals were stable, and no other acute medical condition were reported by patient. the patient was felt safe to be discharge at [facility].  Consultants: None Procedures: None  Discharge Exam: General: Appear in no distress, no Rash; Oral Mucosa Clear, moist. Cardiovascular: S1 and S2 Present, no Murmur, Respiratory: normal respiratory effort, Bilateral Air entry present and no Crackles, no wheezes Abdomen: Bowel Sound present, Soft and no tenderness, no hernia Extremities: no Pedal edema, no calf tenderness Neurology: alert and oriented to place and person affect appropriate.  Filed Weights   03/31/21 2055 04/01/21 0031  Weight: 59 kg 57.2 kg   Vitals:   04/03/21 2001 04/04/21 0826  BP: 112/90 110/83  Pulse: 94 (!) 101  Resp: 15 18  Temp: 98.9 F (37.2 C) 97.7 F (36.5 C)  SpO2: 100% 100%     DISCHARGE MEDICATION: Allergies  as of 04/04/2021   No Known Allergies      Medication List     STOP taking these medications    clotrimazole 10 MG troche Commonly known as: MYCELEX   gabapentin 600 MG tablet Commonly known as: Neurontin   hydrALAZINE 10 MG tablet Commonly known as: APRESOLINE   hydrochlorothiazide 12.5 MG capsule Commonly known as: MICROZIDE   losartan 25 MG tablet Commonly known as: COZAAR   naloxegol oxalate 12.5 MG Tabs tablet Commonly known as: MOVANTIK   potassium chloride 10 MEQ tablet Commonly known as: KLOR-CON   Replens Gel   sodium chloride 0.65 % Soln nasal spray Commonly known as: OCEAN   tiZANidine 4 MG tablet Commonly known as: ZANAFLEX   Turmeric Curcumin 500 MG Caps       TAKE these medications    acetaminophen 500 MG tablet Commonly known as: TYLENOL Take 1,000 mg by mouth every 8 (eight) hours as needed for moderate pain.   alendronate 70 MG tablet Commonly known as: FOSAMAX Take 70 mg by mouth once a week.   ascorbic acid 500 MG tablet Commonly known as: VITAMIN C Take 500 mg by mouth daily.   aspirin EC 81 MG tablet Take 1 tablet (81 mg total) by mouth at bedtime. Swallow whole.   atorvastatin 20 MG tablet Commonly known as: LIPITOR Take 1 tablet (20 mg total) by mouth daily.   Biotin 1 MG Caps Take 1 mg by mouth daily.   CALCIUM PO Take 1 tablet by mouth daily.   cetirizine 10 MG tablet Commonly known as: ZYRTEC Take 10 mg by mouth every morning.   Cranberry 500 MG Caps Take 500 mg by mouth daily.   diltiazem 60 MG 12 hr capsule Commonly known as: CARDIZEM SR Take 1 capsule (60 mg total) by mouth 2 (two) times daily.   escitalopram 5 MG tablet Commonly known as: LEXAPRO Take 1 tablet (5 mg total) by mouth every morning.   folic acid 400 MCG tablet Commonly known as: FOLVITE Take 1 tablet (400 mcg total) by mouth daily. SEPARATE ALL SUPPLEMENTS TO LUNCH OR DINNER AND PRILOSEC NOT TO MESS  W/THYROID MED   HEALTHY COLON PO Take 1 capsule by mouth daily.   levothyroxine 88 MCG tablet Commonly known as: SYNTHROID Take 1 tablet (88 mcg total) by mouth daily before breakfast. Skip sundays   lidocaine 5 % Commonly known as: LIDODERM Place 1-2 patches onto the skin daily as needed (pain). Remove & Discard patch within 12 hours or as directed by MD   LORazepam 0.5 MG tablet Commonly known as: Ativan Take 1 tablet (0.5 mg total) by mouth 2 (two) times daily as needed for anxiety or sleep. Or 1 mg at night for sleep   magic mouthwash (nystatin, lidocaine, diphenhydrAMINE, alum & mag hydroxide) suspension Swish and spit 5 mLs 4 (four) times daily as needed for mouth pain.   magnesium oxide 400 (240 Mg) MG tablet Commonly known as: MAG-OX Take 1 tablet (400 mg total) by mouth 2 (two) times daily for 7 days.   melatonin 5 MG Tabs Take 1 tablet (5 mg total) by mouth at bedtime.   montelukast 10 MG tablet Commonly known as: SINGULAIR Take 1 tablet (10 mg total) by mouth at bedtime.   multivitamin with minerals Tabs tablet Take 1 tablet by mouth daily.   Myrbetriq 50 MG Tb24 tablet Generic drug: mirabegron ER TAKE ONE TABLET EVERY DAY   omeprazole 40 MG capsule Commonly known as: PRILOSEC Take  1 capsule (40 mg total) by mouth daily. After lunch   polyethylene glycol powder 17 GM/SCOOP powder Commonly known as: GLYCOLAX/MIRALAX Take 17 g by mouth daily as needed for moderate constipation.   pregabalin 75 MG capsule Commonly known as: LYRICA Take 75 mg by mouth 3 (three) times daily.   QUEtiapine 25 MG tablet Commonly known as: SEROQUEL Take 0.5 tablets (12.5 mg total) by mouth at bedtime.   traZODone 50 MG tablet Commonly known as: DESYREL TAKE 2 TABLETS EVERY DAY 1 HOUR BEFORE BED   vitamin B-12 1000 MCG tablet Commonly known as: CYANOCOBALAMIN Take 1 tablet (1,000 mcg total) by mouth daily.   Vitamin D3 125 MCG (5000 UT) Tabs Take 1 tablet (5,000 Units  total) by mouth daily.       No Known Allergies Discharge Instructions     Call MD for:  extreme fatigue   Complete by: As directed    Call MD for:  persistant dizziness or light-headedness   Complete by: As directed    Call MD for:  severe uncontrolled pain   Complete by: As directed    Call MD for:  temperature >100.4   Complete by: As directed    Diet - low sodium heart healthy   Complete by: As directed    Discharge instructions   Complete by: As directed    Follow with PCP in 1 week, Follow-up with neurology in 1 to 2 weeks if persistent decline in mental status and sundowning. Repeat CBC and BMP, magnesium and phosphorus level after 1 week.  Monitor BP and titrate medications accordingly, discontinued some of the home medications due to low blood pressure.   Increase activity slowly   Complete by: As directed        The results of significant diagnostics from this hospitalization (including imaging, microbiology, ancillary and laboratory) are listed below for reference.    Significant Diagnostic Studies: DG Abd 1 View  Result Date: 04/03/2021 CLINICAL DATA:  History of constipation EXAM: ABDOMEN - 1 VIEW COMPARISON:  None. FINDINGS: Multilevel fusion and posterior fixation are seen throughout thoracic spine. Scattered large and small bowel gas is noted. No obstructive changes are seen. No findings to suggest constipation are noted. IMPRESSION: Postsurgical changes.  No acute abnormality noted. Electronically Signed   By: Alcide CleverMark  Lukens M.D.   On: 04/03/2021 12:35   CT HEAD WO CONTRAST (5MM)  Result Date: 04/01/2021 CLINICAL DATA:  Mental status change. Unknown cause. Increased confusion, no trauma or fall. EXAM: CT HEAD WITHOUT CONTRAST TECHNIQUE: Contiguous axial images were obtained from the base of the skull through the vertex without intravenous contrast. COMPARISON:  CT head 06/30/2018 BRAIN: BRAIN Cerebral ventricle sizes are concordant with the degree of cerebral  volume loss. Patchy and confluent areas of decreased attenuation are noted throughout the deep and periventricular white matter of the cerebral hemispheres bilaterally, compatible with chronic microvascular ischemic disease. No evidence of large-territorial acute infarction. No parenchymal hemorrhage. No mass lesion. No extra-axial collection. No mass effect or midline shift. No hydrocephalus. Basilar cisterns are patent. Vascular: No hyperdense vessel. Atherosclerotic calcifications are present within the cavernous internal carotid arteries. Skull: No acute fracture or focal lesion. Sinuses/Orbits: Paranasal sinuses and mastoid air cells are clear. The orbits are unremarkable. Other: None. IMPRESSION: No acute intracranial abnormality. Electronically Signed   By: Tish FredericksonMorgane  Naveau M.D.   On: 04/01/2021 16:30   DG Chest Port 1 View  Result Date: 03/31/2021 CLINICAL DATA:  Questionable sepsis.  Evaluate for abnormality.  EXAM: PORTABLE CHEST 1 VIEW COMPARISON:  March 04, 2017 FINDINGS: No pneumothorax. Nodular density in the right apex may be associated with the nearby EKG lead. The heart, hila, and mediastinum are unremarkable. The lungs are otherwise clear. Pedicle rods and screws seen in the thoracic and lumbar spine. IMPRESSION: 1. No evidence of pneumonia. 2. Nodular density over the right apex may be associated with the nearby EKG lead. Recommend a PA and lateral chest x-ray without EKG leads before discharge. Electronically Signed   By: Gerome Sam III M.D.   On: 03/31/2021 18:58    Microbiology: Recent Results (from the past 240 hour(s))  Urine Culture     Status: Abnormal   Collection Time: 03/31/21  6:37 PM   Specimen: Urine, Random  Result Value Ref Range Status   Specimen Description   Final    URINE, RANDOM Performed at [facility], [address] [place], [place] [address]    Special Requests   Final    NONE Performed at [facility], [address]  [place], [place] [address]    Culture >=100,000 COLONIES/mL KLEBSIELLA PNEUMONIAE (A)  Final   Report Status 04/03/2021 FINAL  Final   Organism ID, Bacteria KLEBSIELLA PNEUMONIAE (A)  Final      Susceptibility   Klebsiella pneumoniae - MIC*    AMPICILLIN RESISTANT Resistant     CEFAZOLIN <=4 SENSITIVE Sensitive     CEFEPIME <=0.12 SENSITIVE Sensitive     CEFTRIAXONE <=0.25 SENSITIVE Sensitive     CIPROFLOXACIN <=0.25 SENSITIVE Sensitive     GENTAMICIN <=1 SENSITIVE Sensitive     IMIPENEM <=0.25 SENSITIVE Sensitive     NITROFURANTOIN 64 INTERMEDIATE Intermediate     TRIMETH/SULFA <=20 SENSITIVE Sensitive     AMPICILLIN/SULBACTAM 4 SENSITIVE Sensitive     PIP/TAZO <=4 SENSITIVE Sensitive     * >=100,000 COLONIES/mL KLEBSIELLA PNEUMONIAE  Blood Culture (routine x 2)     Status: None (Preliminary result)   Collection Time: 03/31/21  7:15 PM   Specimen: BLOOD  Result Value Ref Range Status   Specimen Description BLOOD LEFT ANTECUBITAL  Final   Special Requests   Final    BOTTLES DRAWN AEROBIC AND ANAEROBIC Blood Culture adequate volume   Culture   Final    NO GROWTH 4 DAYS Performed at [facility], [address], [place], [place] [address]    Report Status PENDING  Incomplete  Blood Culture (routine x 2)     Status: None (Preliminary result)   Collection Time: 03/31/21  7:19 PM   Specimen: BLOOD  Result Value Ref Range Status   Specimen Description BLOOD RIGHT ANTECUBITAL  Final   Special Requests   Final    BOTTLES DRAWN AEROBIC AND ANAEROBIC Blood Culture adequate volume   Culture   Final    NO GROWTH 4 DAYS Performed at [facility], [address], [place], [place] [address]    Report Status PENDING  Incomplete  Resp Panel by RT-PCR (Flu A&B, Covid) Nasopharyngeal Swab     Status: None   Collection Time: 03/31/21 11:05 PM   Specimen: Nasopharyngeal Swab; Nasopharyngeal(NP) swabs in vial transport medium  Result Value Ref Range Status   SARS Coronavirus 2  by RT PCR NEGATIVE NEGATIVE Final    Comment: (NOTE) SARS-CoV-2 target nucleic acids are NOT DETECTED.  The SARS-CoV-2 RNA is generally detectable in upper respiratory specimens during the acute phase of infection. The lowest concentration of SARS-CoV-2 viral copies this assay can detect  is 138 copies/mL. A negative result does not preclude SARS-Cov-2 infection and should not be used as the sole basis for treatment or other patient management decisions. A negative result may occur with  improper specimen collection/handling, submission of specimen other than nasopharyngeal swab, presence of viral mutation(s) within the areas targeted by this assay, and inadequate number of viral copies(<138 copies/mL). A negative result must be combined with clinical observations, patient history, and epidemiological information. The expected result is Negative.  Fact Sheet for Patients:  BloggerCourse.com  Fact Sheet for Healthcare Providers:  SeriousBroker.it  This test is no t yet approved or cleared by the [place] FDA and  has been authorized for detection and/or diagnosis of SARS-CoV-2 by FDA under an Emergency Use Authorization (EUA). This EUA will remain  in effect (meaning this test can be used) for the duration of the COVID-19 declaration under Section 564(b)(1) of the Act, 21 U.S.C.section 360bbb-3(b)(1), unless the authorization is terminated  or revoked sooner.       Influenza A by PCR NEGATIVE NEGATIVE Final   Influenza B by PCR NEGATIVE NEGATIVE Final    Comment: (NOTE) The Xpert Xpress SARS-CoV-2/FLU/RSV plus assay is intended as an aid in the diagnosis of influenza from Nasopharyngeal swab specimens and should not be used as a sole basis for treatment. Nasal washings and aspirates are unacceptable for Xpert Xpress SARS-CoV-2/FLU/RSV testing.  Fact Sheet for Patients: BloggerCourse.com  Fact  Sheet for Healthcare Providers: SeriousBroker.it  This test is not yet approved or cleared by the [place] FDA and has been authorized for detection and/or diagnosis of SARS-CoV-2 by FDA under an Emergency Use Authorization (EUA). This EUA will remain in effect (meaning this test can be used) for the duration of the COVID-19 declaration under Section 564(b)(1) of the Act, 21 U.S.C. section 360bbb-3(b)(1), unless the authorization is terminated or revoked.  Performed at [facility], [address] [place], [place] [address]   Resp Panel by RT-PCR (Flu A&B, Covid) Nasopharyngeal Swab     Status: None   Collection Time: 04/03/21  3:45 PM   Specimen: Nasopharyngeal Swab; Nasopharyngeal(NP) swabs in vial transport medium  Result Value Ref Range Status   SARS Coronavirus 2 by RT PCR NEGATIVE NEGATIVE Final    Comment: (NOTE) SARS-CoV-2 target nucleic acids are NOT DETECTED.  The SARS-CoV-2 RNA is generally detectable in upper respiratory specimens during the acute phase of infection. The lowest concentration of SARS-CoV-2 viral copies this assay can detect is 138 copies/mL. A negative result does not preclude SARS-Cov-2 infection and should not be used as the sole basis for treatment or other patient management decisions. A negative result may occur with  improper specimen collection/handling, submission of specimen other than nasopharyngeal swab, presence of viral mutation(s) within the areas targeted by this assay, and inadequate number of viral copies(<138 copies/mL). A negative result must be combined with clinical observations, patient history, and epidemiological information. The expected result is Negative.  Fact Sheet for Patients:  BloggerCourse.com  Fact Sheet for Healthcare Providers:  SeriousBroker.it  This test is no t yet approved or cleared by the [place] FDA and  has  been authorized for detection and/or diagnosis of SARS-CoV-2 by FDA under an Emergency Use Authorization (EUA). This EUA will remain  in effect (meaning this test can be used) for the duration of the COVID-19 declaration under Section 564(b)(1) of the Act, 21 U.S.C.section 360bbb-3(b)(1), unless the authorization is terminated  or revoked sooner.  Influenza A by PCR NEGATIVE NEGATIVE Final   Influenza B by PCR NEGATIVE NEGATIVE Final    Comment: (NOTE) The Xpert Xpress SARS-CoV-2/FLU/RSV plus assay is intended as an aid in the diagnosis of influenza from Nasopharyngeal swab specimens and should not be used as a sole basis for treatment. Nasal washings and aspirates are unacceptable for Xpert Xpress SARS-CoV-2/FLU/RSV testing.  Fact Sheet for Patients: BloggerCourse.com  Fact Sheet for Healthcare Providers: SeriousBroker.it  This test is not yet approved or cleared by the [place] FDA and has been authorized for detection and/or diagnosis of SARS-CoV-2 by FDA under an Emergency Use Authorization (EUA). This EUA will remain in effect (meaning this test can be used) for the duration of the COVID-19 declaration under Section 564(b)(1) of the Act, 21 U.S.C. section 360bbb-3(b)(1), unless the authorization is terminated or revoked.  Performed at [facility], [address] [place], [place] [address]      Labs: CBC: Recent Labs  Lab 03/31/21 1925 04/01/21 0556 04/04/21 0935  WBC 8.4 5.5 4.6  NEUTROABS 6.8  --   --   HGB 15.0 12.7 12.0  HCT 41.8 34.5* 34.9*  MCV 89.1 91.0 93.6  PLT 344 246 202   Basic Metabolic Panel: Recent Labs  Lab 03/31/21 1925 04/01/21 0556 04/04/21 0935  NA 127* 131* 134*  K 3.3* 3.0* 4.5  CL 87* 95* 105  CO2 26 26 25   GLUCOSE 130* 95 116*  BUN 13 10 6*  CREATININE 0.76 0.55 0.52  CALCIUM 9.0 8.2* 8.4*  MG  --   --  1.6*  PHOS  --   --  2.4*   Liver Function  Tests: Recent Labs  Lab 03/31/21 1925  AST 32  ALT 19  ALKPHOS 102  BILITOT 1.2  PROT 7.3  ALBUMIN 3.1*   No results for input(s): LIPASE, AMYLASE in the last 168 hours. No results for input(s): AMMONIA in the last 168 hours. Cardiac Enzymes: No results for input(s): CKTOTAL, CKMB, CKMBINDEX, TROPONINI in the last 168 hours. BNP (last 3 results) No results for input(s): BNP in the last 8760 hours. CBG: No results for input(s): GLUCAP in the last 168 hours.  Time spent: 35 minutes  Signed:  04/02/21  [facility]  04/04/2021 1:42 PM

## 2021-04-04 NOTE — TOC Transition Note (Signed)
Transition of Care [facility]) - CM/SW Discharge Note   Patient Details  Name: ADYLIN HANKEY MRN: [id number] Date of Birth: [birth year]  [facility] CM/SW Contact:  Chapman Fitch, RN Phone Number: 04/04/2021, 2:24 PM   Clinical Narrative:      Patient will DC to:[facility] Anticipated DC date: 04/04/21 Family notified:Son Gene Transport by:EMS  Per MD patient ready for DC to . RN, patient, patient's family, and facility notified of DC. Discharge Summary sent to facility. RN given number for report. DC packet on chart. Ambulance transport requested for patient.  TOC signing off.  Bevelyn Ngo [facility] [phone]    Barriers to Discharge: Continued Medical Work up   Patient Goals and CMS Choice Patient states their goals for this hospitalization and ongoing recovery are:: to return to SNF CMS Medicare.gov Compare Post Acute Care list provided to:: Patient Represenative (must comment) Choice offered to / list presented to : Adult Children  Discharge Placement                       Discharge Plan and Services                                     Social Determinants of Health (SDOH) Interventions     Readmission Risk Interventions No flowsheet data found.

## 2021-04-04 NOTE — Progress Notes (Signed)
Report called to Ashley RN at [facility]. 

## 2021-04-05 LAB — CULTURE, BLOOD (ROUTINE X 2)
Culture: NO GROWTH
Culture: NO GROWTH
Special Requests: ADEQUATE
Special Requests: ADEQUATE

## 2021-04-12 DIAGNOSIS — D7389 Other diseases of spleen: Secondary | ICD-10-CM | POA: Diagnosis not present

## 2021-04-12 DIAGNOSIS — M419 Scoliosis, unspecified: Secondary | ICD-10-CM | POA: Diagnosis not present

## 2021-04-12 DIAGNOSIS — Z981 Arthrodesis status: Secondary | ICD-10-CM | POA: Diagnosis not present

## 2021-04-12 DIAGNOSIS — M4326 Fusion of spine, lumbar region: Secondary | ICD-10-CM | POA: Diagnosis not present

## 2021-04-16 DIAGNOSIS — K5900 Constipation, unspecified: Secondary | ICD-10-CM | POA: Diagnosis not present

## 2021-04-16 DIAGNOSIS — N1831 Chronic kidney disease, stage 3a: Secondary | ICD-10-CM | POA: Diagnosis not present

## 2021-04-16 DIAGNOSIS — F330 Major depressive disorder, recurrent, mild: Secondary | ICD-10-CM | POA: Diagnosis not present

## 2021-04-16 DIAGNOSIS — I10 Essential (primary) hypertension: Secondary | ICD-10-CM | POA: Diagnosis not present

## 2021-05-14 DIAGNOSIS — F330 Major depressive disorder, recurrent, mild: Secondary | ICD-10-CM | POA: Diagnosis not present

## 2021-05-14 DIAGNOSIS — N1831 Chronic kidney disease, stage 3a: Secondary | ICD-10-CM | POA: Diagnosis not present

## 2021-05-14 DIAGNOSIS — G309 Alzheimer's disease, unspecified: Secondary | ICD-10-CM | POA: Diagnosis not present

## 2021-05-14 DIAGNOSIS — I10 Essential (primary) hypertension: Secondary | ICD-10-CM | POA: Diagnosis not present

## 2021-05-21 DIAGNOSIS — M810 Age-related osteoporosis without current pathological fracture: Secondary | ICD-10-CM | POA: Diagnosis not present

## 2021-05-21 DIAGNOSIS — M5459 Other low back pain: Secondary | ICD-10-CM | POA: Diagnosis not present

## 2021-05-21 DIAGNOSIS — F419 Anxiety disorder, unspecified: Secondary | ICD-10-CM | POA: Diagnosis not present

## 2021-05-21 DIAGNOSIS — G309 Alzheimer's disease, unspecified: Secondary | ICD-10-CM | POA: Diagnosis not present

## 2021-05-21 DIAGNOSIS — R2681 Unsteadiness on feet: Secondary | ICD-10-CM | POA: Diagnosis not present

## 2021-05-21 DIAGNOSIS — G603 Idiopathic progressive neuropathy: Secondary | ICD-10-CM | POA: Diagnosis not present

## 2021-05-21 DIAGNOSIS — N1831 Chronic kidney disease, stage 3a: Secondary | ICD-10-CM | POA: Diagnosis not present

## 2021-06-07 DIAGNOSIS — M4800 Spinal stenosis, site unspecified: Secondary | ICD-10-CM | POA: Diagnosis not present

## 2021-06-07 DIAGNOSIS — K5900 Constipation, unspecified: Secondary | ICD-10-CM | POA: Diagnosis not present

## 2021-06-07 DIAGNOSIS — E538 Deficiency of other specified B group vitamins: Secondary | ICD-10-CM | POA: Diagnosis not present

## 2021-06-07 DIAGNOSIS — I519 Heart disease, unspecified: Secondary | ICD-10-CM | POA: Diagnosis not present

## 2021-06-07 DIAGNOSIS — Z87440 Personal history of urinary (tract) infections: Secondary | ICD-10-CM | POA: Diagnosis not present

## 2021-06-07 DIAGNOSIS — M810 Age-related osteoporosis without current pathological fracture: Secondary | ICD-10-CM | POA: Diagnosis not present

## 2021-06-07 DIAGNOSIS — F419 Anxiety disorder, unspecified: Secondary | ICD-10-CM | POA: Diagnosis not present

## 2021-06-07 DIAGNOSIS — M419 Scoliosis, unspecified: Secondary | ICD-10-CM | POA: Diagnosis not present

## 2021-06-07 DIAGNOSIS — G4700 Insomnia, unspecified: Secondary | ICD-10-CM | POA: Diagnosis not present

## 2021-06-22 DIAGNOSIS — I700 Atherosclerosis of aorta: Secondary | ICD-10-CM | POA: Diagnosis not present

## 2021-06-22 DIAGNOSIS — M419 Scoliosis, unspecified: Secondary | ICD-10-CM | POA: Diagnosis not present

## 2021-06-22 DIAGNOSIS — M4324 Fusion of spine, thoracic region: Secondary | ICD-10-CM | POA: Diagnosis not present

## 2021-06-22 DIAGNOSIS — M4326 Fusion of spine, lumbar region: Secondary | ICD-10-CM | POA: Diagnosis not present

## 2021-06-22 DIAGNOSIS — Z981 Arthrodesis status: Secondary | ICD-10-CM | POA: Diagnosis not present

## 2021-06-26 DIAGNOSIS — I519 Heart disease, unspecified: Secondary | ICD-10-CM | POA: Diagnosis not present

## 2021-06-26 DIAGNOSIS — G4700 Insomnia, unspecified: Secondary | ICD-10-CM | POA: Diagnosis not present

## 2021-06-26 DIAGNOSIS — F419 Anxiety disorder, unspecified: Secondary | ICD-10-CM | POA: Diagnosis not present

## 2021-07-02 DIAGNOSIS — M6281 Muscle weakness (generalized): Secondary | ICD-10-CM | POA: Diagnosis not present

## 2021-07-02 DIAGNOSIS — R2689 Other abnormalities of gait and mobility: Secondary | ICD-10-CM | POA: Diagnosis not present

## 2021-07-02 DIAGNOSIS — R279 Unspecified lack of coordination: Secondary | ICD-10-CM | POA: Diagnosis not present

## 2021-07-03 DIAGNOSIS — M4800 Spinal stenosis, site unspecified: Secondary | ICD-10-CM | POA: Diagnosis not present

## 2021-07-03 DIAGNOSIS — G629 Polyneuropathy, unspecified: Secondary | ICD-10-CM | POA: Diagnosis not present

## 2021-07-03 DIAGNOSIS — E538 Deficiency of other specified B group vitamins: Secondary | ICD-10-CM | POA: Diagnosis not present

## 2021-07-04 DIAGNOSIS — R2689 Other abnormalities of gait and mobility: Secondary | ICD-10-CM | POA: Diagnosis not present

## 2021-07-04 DIAGNOSIS — R279 Unspecified lack of coordination: Secondary | ICD-10-CM | POA: Diagnosis not present

## 2021-07-04 DIAGNOSIS — M6281 Muscle weakness (generalized): Secondary | ICD-10-CM | POA: Diagnosis not present

## 2021-07-05 DIAGNOSIS — R279 Unspecified lack of coordination: Secondary | ICD-10-CM | POA: Diagnosis not present

## 2021-07-05 DIAGNOSIS — R2689 Other abnormalities of gait and mobility: Secondary | ICD-10-CM | POA: Diagnosis not present

## 2021-07-05 DIAGNOSIS — M6281 Muscle weakness (generalized): Secondary | ICD-10-CM | POA: Diagnosis not present

## 2021-07-06 ENCOUNTER — Ambulatory Visit (INDEPENDENT_AMBULATORY_CARE_PROVIDER_SITE_OTHER): Payer: Medicare HMO | Admitting: Internal Medicine

## 2021-07-06 ENCOUNTER — Other Ambulatory Visit: Payer: Self-pay

## 2021-07-06 ENCOUNTER — Encounter: Payer: Self-pay | Admitting: Internal Medicine

## 2021-07-06 VITALS — BP 120/80 | HR 82 | Ht 63.5 in | Wt 157.0 lb

## 2021-07-06 DIAGNOSIS — E782 Mixed hyperlipidemia: Secondary | ICD-10-CM | POA: Diagnosis not present

## 2021-07-06 DIAGNOSIS — I25118 Atherosclerotic heart disease of native coronary artery with other forms of angina pectoris: Secondary | ICD-10-CM | POA: Diagnosis not present

## 2021-07-06 DIAGNOSIS — M6281 Muscle weakness (generalized): Secondary | ICD-10-CM | POA: Diagnosis not present

## 2021-07-06 DIAGNOSIS — R2689 Other abnormalities of gait and mobility: Secondary | ICD-10-CM | POA: Diagnosis not present

## 2021-07-06 DIAGNOSIS — I491 Atrial premature depolarization: Secondary | ICD-10-CM | POA: Diagnosis not present

## 2021-07-06 DIAGNOSIS — I10 Essential (primary) hypertension: Secondary | ICD-10-CM

## 2021-07-06 DIAGNOSIS — R279 Unspecified lack of coordination: Secondary | ICD-10-CM | POA: Diagnosis not present

## 2021-07-06 NOTE — Patient Instructions (Signed)
Medication Instructions:  ° °Your physician recommends that you continue on your current medications as directed. Please refer to the Current Medication list given to you today. ° °*If you need a refill on your cardiac medications before your next appointment, please call your pharmacy* ° ° °Lab Work: ° °None ordered ° °Testing/Procedures: ° °None ordered ° ° °Follow-Up: °At [facility], you and your health needs are our priority.  As part of our continuing mission to provide you with exceptional heart care, we have created designated Provider Care Teams.  These Care Teams include your primary Cardiologist (physician) and Advanced Practice Providers (APPs -  Physician Assistants and Nurse Practitioners) who all work together to provide you with the care you need, when you need it. ° °We recommend signing up for the patient portal called "MyChart".  Sign up information is provided on this After Visit Summary.  MyChart is used to connect with patients for Virtual Visits (Telemedicine).  Patients are able to view lab/test results, encounter notes, upcoming appointments, etc.  Non-urgent messages can be sent to your provider as well.   °To learn more about what you can do with MyChart, go to [email or web address]://[email or web address].com.   ° °Your next appointment:   °6 month(s) ° °The format for your next appointment:   °In Person ° °Provider:   °You may see Christopher End, MD or one of the following Advanced Practice Providers on your designated Care Team:   °Christopher Berge, NP °Ryan Dunn, PA-C °Cadence Furth, PA-C °

## 2021-07-06 NOTE — Progress Notes (Signed)
Follow-up Outpatient Visit Date: 07/06/2021  Primary Care Provider: Virgie Dad, [address] [place] [address]  Chief Complaint: Follow-up coronary artery disease  HPI:  [facility] is a 75 y.o. female with history of moderate coronary artery disease by cardiac CTA, hypertension, hyperlipidemia, "mini strokes," GERD, depression, and anxiety, who presents for follow-up of shortness of breath, hypertension, coronary artery disease.  I last saw her in January, at which time she was most concerned about worsening back pain that had actually increased after spinal stimulator implantation.  [facility] complained of shortness of breath that she attributed to her back pain.  Myocardial perfusion stress test in February was low risk without ischemia or scar.  [facility] underwent back surgery in June and has been at [facility] since then doing therapy.  She is having to learn how to walk again.  She is unsure of when she may be discharged.  Her mobility is slowly improving though she still spends most of her time in a wheelchair.  She denies chest pain, palpitations, and lightheadedness.  She gets a little out of breath when she overexerts herself.  This has been stable and over the last 6 months.  She also notes mild dependent swelling in her legs.  She has experienced some numbness and tingling in her left foot that began in the great toe and seems to be moving more proximally.  She mentioned this to the PA who saw her at her last neurosurgery visit; no further recommendations were provided.  --------------------------------------------------------------------------------------------------  Cardiovascular History & Procedures: Cardiovascular Problems: Orthostatic lightheadedness and labile blood pressure Dyspnea on exertion   Risk Factors: Hypertension, hyperlipidemia, family history, sedentary lifestyle, and age greater than 33   Cath/PCI: None   CV  Surgery: None   EP Procedures and Devices: 14-day event monitor (04/24/17): Sinus rhythm without arrhythmias. 48 hour Holter monitor (03/20/17): Predominantly sinus rhythm with occasional PACs and rare PVCs. Brief episode of blocked PACs versus 2:1 AV block.   Non-Invasive Evaluation(s): Pharmacologic MPI (09/13/2020): Low risk study without ischemia or scar.  Calculated LVEF of 36% artifactually low with normal LVEF by visual estimation. Renal artery Doppler (12/26/17): No evidence of renal artery stenosis.  Incidental note made of 2.1 cm left renal cyst. Carotid Doppler (09/08/17): Minimal plaquing of both carotid arteries.  No significant stenosis.  Patent vertebral arteries with antegrade flow bilaterally.  Normal hemodynamics in both subclavian arteries. Coronary CTA (04/30/17): Mild to moderate coronary artery disease proximal LAD and ostium of small D2.  CT FFR of distal most LAD is 0.79.  Coronary calcium score 257.  Small left lower lobe nodule (3 mm). TTE (03/20/17): Normal LV size with mild focal basal hypertrophy of the septum. LVEF 50-55% with normal wall motion. Grade 1 diastolic dysfunction. Mitral annular calcification present. Normal RV size and function. Normal pulmonary artery pressure. Pharmacologic MPI (12/14/15): Low risk without ischemia or scar. LVEF 68%. TTE (12/14/15): Normal LV size and wall thickness. LVEF 55-60%. Normal RV size and function. No significant valvular abnormalities.  Recent CV Pertinent Labs: Lab Results  Component Value Date   CHOL 125 11/24/2019   CHOL 119 08/22/2017   HDL 46.40 11/24/2019   HDL 57 08/22/2017   LDLCALC 47 11/24/2019   LDLCALC 46 08/22/2017   TRIG 158.0 (H) 11/24/2019   CHOLHDL 3 11/24/2019   INR 1.0 03/31/2021   K 4.5 04/04/2021   MG 1.6 (L) 04/04/2021   BUN 6 (L) 04/04/2021  BUN 17 04/11/2017   CREATININE 0.52 04/04/2021   CREATININE 1.18 (H) 01/07/2017    Past medical and surgical history were reviewed and updated in  EPIC.  Current Meds  Medication Sig   acetaminophen (TYLENOL) 500 MG tablet Take 1,000 mg by mouth every 8 (eight) hours as needed for moderate pain.   alendronate (FOSAMAX) 70 MG tablet Take 70 mg by mouth once a week.    ascorbic acid (VITAMIN C) 500 MG tablet Take 500 mg by mouth daily.   aspirin EC 81 MG tablet Take 1 tablet (81 mg total) by mouth at bedtime. Swallow whole.   atorvastatin (LIPITOR) 20 MG tablet Take 1 tablet (20 mg total) by mouth daily.   Biotin 1 MG CAPS Take 1 mg by mouth daily.    cetirizine (ZYRTEC) 10 MG tablet Take 10 mg by mouth every morning.   Cholecalciferol (VITAMIN D3) 125 MCG (5000 UT) TABS Take 1 tablet (5,000 Units total) by mouth daily.   Cranberry 450 MG CAPS Take 1 capsule by mouth daily.   diltiazem (CARDIZEM SR) 60 MG 12 hr capsule Take 1 capsule (60 mg total) by mouth 2 (two) times daily.   escitalopram (LEXAPRO) 5 MG tablet Take 1 tablet (5 mg total) by mouth every morning.   folic acid (FOLVITE) 400 MCG tablet Take 1 tablet (400 mcg total) by mouth daily. SEPARATE ALL SUPPLEMENTS TO LUNCH OR DINNER AND PRILOSEC NOT TO MESS W/THYROID MED   levothyroxine (SYNTHROID) 88 MCG tablet Take 1 tablet (88 mcg total) by mouth daily before breakfast. Skip sundays   lidocaine (LIDODERM) 5 % Place 1-2 patches onto the skin daily as needed (pain). Remove & Discard patch within 12 hours or as directed by MD   melatonin 5 MG TABS Take 1 tablet (5 mg total) by mouth at bedtime.   montelukast (SINGULAIR) 10 MG tablet Take 1 tablet (10 mg total) by mouth at bedtime.   Multiple Vitamin (MULTIVITAMIN WITH MINERALS) TABS tablet Take 1 tablet by mouth daily.   MYRBETRIQ 50 MG TB24 tablet TAKE ONE TABLET EVERY DAY   omeprazole (PRILOSEC) 40 MG capsule Take 1 capsule (40 mg total) by mouth daily. After lunch   polyethylene glycol powder (GLYCOLAX/MIRALAX) 17 GM/SCOOP powder Take 17 g by mouth daily as needed for moderate constipation.   pregabalin (LYRICA) 75 MG capsule  Take 75 mg by mouth 3 (three) times daily.   traZODone (DESYREL) 50 MG tablet TAKE 2 TABLETS EVERY DAY 1 HOUR BEFORE BED   vitamin B-12 (CYANOCOBALAMIN) 1000 MCG tablet Take 1 tablet (1,000 mcg total) by mouth daily.    Allergies: Patient has no known allergies.  Social History   Tobacco Use   Smoking status: Passive Smoke Exposure - Never Smoker   Smokeless tobacco: Never   Tobacco comments:    husbands and children smoked in home.   Vaping Use   Vaping Use: Never used  Substance Use Topics   Alcohol use: No   Drug use: No    Family History  Problem Relation Age of Onset   Heart disease Mother    Hypertension Mother    Diabetes Mother    Heart attack Mother 86   Heart disease Father    Heart attack Father 12   Breast cancer Maternal Aunt    Heart attack Brother     Review of Systems: A 12-system review of systems was performed and was negative except as noted in the HPI.  --------------------------------------------------------------------------------------------------  Physical Exam: BP  120/80 (BP Location: Right Arm, Patient Position: Sitting, Cuff Size: Normal)   Pulse 82   Ht 5' 3.5" (1.613 m)   Wt 157 lb (71.2 kg)   SpO2 96%   BMI 27.38 kg/m   General:  NAD. Neck: No JVD or HJR. Lungs: Clear to auscultation bilaterally without wheezes or crackles. Heart: Regular rate and rhythm with occasional extrasystoles.  No murmurs, rubs, or gallops. Abdomen: Soft, nontender, nondistended. Extremities: Trace ankle edema bilaterally.  EKG: Sinus rhythm with PACs, borderline Inferior Q waves, and poor R wave progression.  PACs are new since 09/01/2020.  Otherwise, there has been no significant interval change.  Lab Results  Component Value Date   WBC 4.6 04/04/2021   HGB 12.0 04/04/2021   HCT 34.9 (L) 04/04/2021   MCV 93.6 04/04/2021   PLT 202 04/04/2021    Lab Results  Component Value Date   NA 134 (L) 04/04/2021   K 4.5 04/04/2021   CL 105 04/04/2021    CO2 25 04/04/2021   BUN 6 (L) 04/04/2021   CREATININE 0.52 04/04/2021   GLUCOSE 116 (H) 04/04/2021   ALT 19 03/31/2021    Lab Results  Component Value Date   CHOL 125 11/24/2019   HDL 46.40 11/24/2019   LDLCALC 47 11/24/2019   TRIG 158.0 (H) 11/24/2019   CHOLHDL 3 11/24/2019    --------------------------------------------------------------------------------------------------  ASSESSMENT AND PLAN: Coronary artery disease: No new angina reported.  Myocardial perfusion stress test earlier this year was low risk without ischemia or scar.  No further testing is recommended at this time.  [facility] should continue her current medications to prevent progression of CAD and antianginal therapy.  PACs: Noted previously and again seen on today's EKG.  [facility] is asymptomatic.  We will continue current dose of diltiazem.  Hypertension: Blood pressure adequately controlled today.  No medication changes at this time.  Hyperlipidemia: Continue atorvastatin 20 mg daily.  Fasting lipid panel should be repeated with her next blood draw through her PCP to ensure that LDL and triglycerides are at goal.  Follow-up: Return to clinic in 6 months.  Nelva Bush, MD 07/06/2021 11:58 AM

## 2021-07-07 DIAGNOSIS — R2689 Other abnormalities of gait and mobility: Secondary | ICD-10-CM | POA: Diagnosis not present

## 2021-07-07 DIAGNOSIS — M6281 Muscle weakness (generalized): Secondary | ICD-10-CM | POA: Diagnosis not present

## 2021-07-07 DIAGNOSIS — R279 Unspecified lack of coordination: Secondary | ICD-10-CM | POA: Diagnosis not present

## 2021-07-09 DIAGNOSIS — R279 Unspecified lack of coordination: Secondary | ICD-10-CM | POA: Diagnosis not present

## 2021-07-09 DIAGNOSIS — R2689 Other abnormalities of gait and mobility: Secondary | ICD-10-CM | POA: Diagnosis not present

## 2021-07-09 DIAGNOSIS — M6281 Muscle weakness (generalized): Secondary | ICD-10-CM | POA: Diagnosis not present

## 2021-07-11 DIAGNOSIS — R279 Unspecified lack of coordination: Secondary | ICD-10-CM | POA: Diagnosis not present

## 2021-07-11 DIAGNOSIS — R2689 Other abnormalities of gait and mobility: Secondary | ICD-10-CM | POA: Diagnosis not present

## 2021-07-11 DIAGNOSIS — M6281 Muscle weakness (generalized): Secondary | ICD-10-CM | POA: Diagnosis not present

## 2021-07-12 DIAGNOSIS — M6281 Muscle weakness (generalized): Secondary | ICD-10-CM | POA: Diagnosis not present

## 2021-07-12 DIAGNOSIS — R279 Unspecified lack of coordination: Secondary | ICD-10-CM | POA: Diagnosis not present

## 2021-07-12 DIAGNOSIS — R2689 Other abnormalities of gait and mobility: Secondary | ICD-10-CM | POA: Diagnosis not present

## 2021-07-13 DIAGNOSIS — M6281 Muscle weakness (generalized): Secondary | ICD-10-CM | POA: Diagnosis not present

## 2021-07-13 DIAGNOSIS — R279 Unspecified lack of coordination: Secondary | ICD-10-CM | POA: Diagnosis not present

## 2021-07-13 DIAGNOSIS — R2689 Other abnormalities of gait and mobility: Secondary | ICD-10-CM | POA: Diagnosis not present

## 2021-07-14 DIAGNOSIS — M6281 Muscle weakness (generalized): Secondary | ICD-10-CM | POA: Diagnosis not present

## 2021-07-14 DIAGNOSIS — R2689 Other abnormalities of gait and mobility: Secondary | ICD-10-CM | POA: Diagnosis not present

## 2021-07-14 DIAGNOSIS — R279 Unspecified lack of coordination: Secondary | ICD-10-CM | POA: Diagnosis not present

## 2021-07-16 DIAGNOSIS — R2689 Other abnormalities of gait and mobility: Secondary | ICD-10-CM | POA: Diagnosis not present

## 2021-07-16 DIAGNOSIS — M6281 Muscle weakness (generalized): Secondary | ICD-10-CM | POA: Diagnosis not present

## 2021-07-16 DIAGNOSIS — R279 Unspecified lack of coordination: Secondary | ICD-10-CM | POA: Diagnosis not present

## 2021-07-18 DIAGNOSIS — R279 Unspecified lack of coordination: Secondary | ICD-10-CM | POA: Diagnosis not present

## 2021-07-18 DIAGNOSIS — M6281 Muscle weakness (generalized): Secondary | ICD-10-CM | POA: Diagnosis not present

## 2021-07-18 DIAGNOSIS — R2689 Other abnormalities of gait and mobility: Secondary | ICD-10-CM | POA: Diagnosis not present

## 2021-07-19 DIAGNOSIS — R2689 Other abnormalities of gait and mobility: Secondary | ICD-10-CM | POA: Diagnosis not present

## 2021-07-19 DIAGNOSIS — M6281 Muscle weakness (generalized): Secondary | ICD-10-CM | POA: Diagnosis not present

## 2021-07-19 DIAGNOSIS — R279 Unspecified lack of coordination: Secondary | ICD-10-CM | POA: Diagnosis not present

## 2021-07-20 DIAGNOSIS — R279 Unspecified lack of coordination: Secondary | ICD-10-CM | POA: Diagnosis not present

## 2021-07-20 DIAGNOSIS — R2689 Other abnormalities of gait and mobility: Secondary | ICD-10-CM | POA: Diagnosis not present

## 2021-07-20 DIAGNOSIS — M6281 Muscle weakness (generalized): Secondary | ICD-10-CM | POA: Diagnosis not present

## 2021-07-22 DIAGNOSIS — R2689 Other abnormalities of gait and mobility: Secondary | ICD-10-CM | POA: Diagnosis not present

## 2021-07-22 DIAGNOSIS — R279 Unspecified lack of coordination: Secondary | ICD-10-CM | POA: Diagnosis not present

## 2021-07-22 DIAGNOSIS — M6281 Muscle weakness (generalized): Secondary | ICD-10-CM | POA: Diagnosis not present

## 2021-07-24 DIAGNOSIS — R2689 Other abnormalities of gait and mobility: Secondary | ICD-10-CM | POA: Diagnosis not present

## 2021-07-24 DIAGNOSIS — M6281 Muscle weakness (generalized): Secondary | ICD-10-CM | POA: Diagnosis not present

## 2021-07-24 DIAGNOSIS — R279 Unspecified lack of coordination: Secondary | ICD-10-CM | POA: Diagnosis not present

## 2021-07-25 ENCOUNTER — Telehealth: Payer: Self-pay

## 2021-07-25 ENCOUNTER — Ambulatory Visit: Payer: Medicare HMO

## 2021-07-25 DIAGNOSIS — M6281 Muscle weakness (generalized): Secondary | ICD-10-CM | POA: Diagnosis not present

## 2021-07-25 DIAGNOSIS — R279 Unspecified lack of coordination: Secondary | ICD-10-CM | POA: Diagnosis not present

## 2021-07-25 DIAGNOSIS — R2689 Other abnormalities of gait and mobility: Secondary | ICD-10-CM | POA: Diagnosis not present

## 2021-07-25 NOTE — Telephone Encounter (Signed)
Called patient for scheduled AWV and noticed patient is no longer a patient of this facility. Current pcp is Melonie Florida, FNP with [facility]. Patient would like to establish with [facility]. Deferred for follow up with previous physician to see if currently accepting patients. AWV appointment canceled.

## 2021-07-26 DIAGNOSIS — R2689 Other abnormalities of gait and mobility: Secondary | ICD-10-CM | POA: Diagnosis not present

## 2021-07-26 DIAGNOSIS — M6281 Muscle weakness (generalized): Secondary | ICD-10-CM | POA: Diagnosis not present

## 2021-07-26 DIAGNOSIS — R279 Unspecified lack of coordination: Secondary | ICD-10-CM | POA: Diagnosis not present

## 2021-07-27 DIAGNOSIS — R2689 Other abnormalities of gait and mobility: Secondary | ICD-10-CM | POA: Diagnosis not present

## 2021-07-27 DIAGNOSIS — M6281 Muscle weakness (generalized): Secondary | ICD-10-CM | POA: Diagnosis not present

## 2021-07-27 DIAGNOSIS — R279 Unspecified lack of coordination: Secondary | ICD-10-CM | POA: Diagnosis not present

## 2021-07-28 DIAGNOSIS — R279 Unspecified lack of coordination: Secondary | ICD-10-CM | POA: Diagnosis not present

## 2021-07-28 DIAGNOSIS — M6281 Muscle weakness (generalized): Secondary | ICD-10-CM | POA: Diagnosis not present

## 2021-07-28 DIAGNOSIS — R2689 Other abnormalities of gait and mobility: Secondary | ICD-10-CM | POA: Diagnosis not present

## 2021-07-31 DIAGNOSIS — R279 Unspecified lack of coordination: Secondary | ICD-10-CM | POA: Diagnosis not present

## 2021-07-31 DIAGNOSIS — R2689 Other abnormalities of gait and mobility: Secondary | ICD-10-CM | POA: Diagnosis not present

## 2021-07-31 DIAGNOSIS — M6281 Muscle weakness (generalized): Secondary | ICD-10-CM | POA: Diagnosis not present

## 2021-08-01 DIAGNOSIS — M6281 Muscle weakness (generalized): Secondary | ICD-10-CM | POA: Diagnosis not present

## 2021-08-01 DIAGNOSIS — R279 Unspecified lack of coordination: Secondary | ICD-10-CM | POA: Diagnosis not present

## 2021-08-01 DIAGNOSIS — R2689 Other abnormalities of gait and mobility: Secondary | ICD-10-CM | POA: Diagnosis not present

## 2021-08-02 DIAGNOSIS — G629 Polyneuropathy, unspecified: Secondary | ICD-10-CM | POA: Diagnosis not present

## 2021-08-02 DIAGNOSIS — G4700 Insomnia, unspecified: Secondary | ICD-10-CM | POA: Diagnosis not present

## 2021-08-02 DIAGNOSIS — G8929 Other chronic pain: Secondary | ICD-10-CM | POA: Diagnosis not present

## 2021-08-03 DIAGNOSIS — R279 Unspecified lack of coordination: Secondary | ICD-10-CM | POA: Diagnosis not present

## 2021-08-03 DIAGNOSIS — R2689 Other abnormalities of gait and mobility: Secondary | ICD-10-CM | POA: Diagnosis not present

## 2021-08-03 DIAGNOSIS — M6281 Muscle weakness (generalized): Secondary | ICD-10-CM | POA: Diagnosis not present

## 2021-08-07 DIAGNOSIS — M79605 Pain in left leg: Secondary | ICD-10-CM | POA: Diagnosis not present

## 2021-08-07 DIAGNOSIS — J984 Other disorders of lung: Secondary | ICD-10-CM | POA: Diagnosis not present

## 2021-08-07 DIAGNOSIS — J90 Pleural effusion, not elsewhere classified: Secondary | ICD-10-CM | POA: Diagnosis not present

## 2021-08-07 DIAGNOSIS — G8929 Other chronic pain: Secondary | ICD-10-CM | POA: Diagnosis not present

## 2021-08-07 DIAGNOSIS — R0789 Other chest pain: Secondary | ICD-10-CM | POA: Diagnosis not present

## 2021-08-08 DIAGNOSIS — R2242 Localized swelling, mass and lump, left lower limb: Secondary | ICD-10-CM | POA: Diagnosis not present

## 2021-08-13 DIAGNOSIS — F419 Anxiety disorder, unspecified: Secondary | ICD-10-CM | POA: Diagnosis not present

## 2021-08-13 DIAGNOSIS — R079 Chest pain, unspecified: Secondary | ICD-10-CM | POA: Diagnosis not present

## 2021-08-13 DIAGNOSIS — Z79899 Other long term (current) drug therapy: Secondary | ICD-10-CM | POA: Diagnosis not present

## 2021-08-13 DIAGNOSIS — R0789 Other chest pain: Secondary | ICD-10-CM | POA: Diagnosis not present

## 2021-08-16 DIAGNOSIS — R0602 Shortness of breath: Secondary | ICD-10-CM | POA: Diagnosis not present

## 2021-08-25 ENCOUNTER — Emergency Department: Payer: Medicare HMO

## 2021-08-25 ENCOUNTER — Other Ambulatory Visit: Payer: Self-pay

## 2021-08-25 ENCOUNTER — Inpatient Hospital Stay
Admission: EM | Admit: 2021-08-25 | Discharge: 2021-08-30 | DRG: 286 | Disposition: A | Discharge status: Skilled Nursing Facility | Payer: Medicare HMO | Source: Skilled Nursing Facility | Attending: Internal Medicine | Admitting: Internal Medicine

## 2021-08-25 DIAGNOSIS — I5023 Acute on chronic systolic (congestive) heart failure: Secondary | ICD-10-CM | POA: Diagnosis present

## 2021-08-25 DIAGNOSIS — K219 Gastro-esophageal reflux disease without esophagitis: Secondary | ICD-10-CM | POA: Diagnosis present

## 2021-08-25 DIAGNOSIS — I2510 Atherosclerotic heart disease of native coronary artery without angina pectoris: Secondary | ICD-10-CM | POA: Diagnosis present

## 2021-08-25 DIAGNOSIS — Z20822 Contact with and (suspected) exposure to covid-19: Secondary | ICD-10-CM | POA: Diagnosis present

## 2021-08-25 DIAGNOSIS — E785 Hyperlipidemia, unspecified: Secondary | ICD-10-CM | POA: Diagnosis present

## 2021-08-25 DIAGNOSIS — I160 Hypertensive urgency: Secondary | ICD-10-CM | POA: Diagnosis present

## 2021-08-25 DIAGNOSIS — I130 Hypertensive heart and chronic kidney disease with heart failure and stage 1 through stage 4 chronic kidney disease, or unspecified chronic kidney disease: Principal | ICD-10-CM | POA: Diagnosis present

## 2021-08-25 DIAGNOSIS — N189 Chronic kidney disease, unspecified: Secondary | ICD-10-CM | POA: Diagnosis present

## 2021-08-25 DIAGNOSIS — E039 Hypothyroidism, unspecified: Secondary | ICD-10-CM | POA: Diagnosis present

## 2021-08-25 DIAGNOSIS — I5022 Chronic systolic (congestive) heart failure: Secondary | ICD-10-CM

## 2021-08-25 DIAGNOSIS — R079 Chest pain, unspecified: Secondary | ICD-10-CM | POA: Diagnosis not present

## 2021-08-25 DIAGNOSIS — F32A Depression, unspecified: Secondary | ICD-10-CM | POA: Diagnosis present

## 2021-08-25 DIAGNOSIS — R531 Weakness: Secondary | ICD-10-CM | POA: Diagnosis not present

## 2021-08-25 DIAGNOSIS — J90 Pleural effusion, not elsewhere classified: Secondary | ICD-10-CM | POA: Diagnosis not present

## 2021-08-25 DIAGNOSIS — Z7982 Long term (current) use of aspirin: Secondary | ICD-10-CM

## 2021-08-25 DIAGNOSIS — Z66 Do not resuscitate: Secondary | ICD-10-CM | POA: Diagnosis not present

## 2021-08-25 DIAGNOSIS — Z90710 Acquired absence of both cervix and uterus: Secondary | ICD-10-CM | POA: Diagnosis not present

## 2021-08-25 DIAGNOSIS — R5381 Other malaise: Secondary | ICD-10-CM | POA: Diagnosis not present

## 2021-08-25 DIAGNOSIS — R0789 Other chest pain: Secondary | ICD-10-CM | POA: Diagnosis not present

## 2021-08-25 DIAGNOSIS — Z8249 Family history of ischemic heart disease and other diseases of the circulatory system: Secondary | ICD-10-CM

## 2021-08-25 DIAGNOSIS — I509 Heart failure, unspecified: Secondary | ICD-10-CM

## 2021-08-25 DIAGNOSIS — I471 Supraventricular tachycardia: Secondary | ICD-10-CM | POA: Diagnosis present

## 2021-08-25 DIAGNOSIS — D61818 Other pancytopenia: Secondary | ICD-10-CM | POA: Diagnosis present

## 2021-08-25 DIAGNOSIS — Z79890 Hormone replacement therapy (postmenopausal): Secondary | ICD-10-CM

## 2021-08-25 DIAGNOSIS — Z79899 Other long term (current) drug therapy: Secondary | ICD-10-CM

## 2021-08-25 DIAGNOSIS — Z9889 Other specified postprocedural states: Secondary | ICD-10-CM | POA: Diagnosis not present

## 2021-08-25 DIAGNOSIS — M549 Dorsalgia, unspecified: Secondary | ICD-10-CM | POA: Diagnosis present

## 2021-08-25 DIAGNOSIS — I420 Dilated cardiomyopathy: Secondary | ICD-10-CM | POA: Diagnosis not present

## 2021-08-25 DIAGNOSIS — Z96652 Presence of left artificial knee joint: Secondary | ICD-10-CM | POA: Diagnosis present

## 2021-08-25 DIAGNOSIS — Z8673 Personal history of transient ischemic attack (TIA), and cerebral infarction without residual deficits: Secondary | ICD-10-CM

## 2021-08-25 DIAGNOSIS — I5043 Acute on chronic combined systolic (congestive) and diastolic (congestive) heart failure: Secondary | ICD-10-CM | POA: Diagnosis not present

## 2021-08-25 DIAGNOSIS — I5031 Acute diastolic (congestive) heart failure: Secondary | ICD-10-CM | POA: Diagnosis not present

## 2021-08-25 DIAGNOSIS — I441 Atrioventricular block, second degree: Secondary | ICD-10-CM | POA: Diagnosis present

## 2021-08-25 DIAGNOSIS — I428 Other cardiomyopathies: Secondary | ICD-10-CM | POA: Diagnosis present

## 2021-08-25 DIAGNOSIS — G629 Polyneuropathy, unspecified: Secondary | ICD-10-CM | POA: Diagnosis present

## 2021-08-25 DIAGNOSIS — Z981 Arthrodesis status: Secondary | ICD-10-CM

## 2021-08-25 DIAGNOSIS — F419 Anxiety disorder, unspecified: Secondary | ICD-10-CM | POA: Diagnosis present

## 2021-08-25 DIAGNOSIS — R0602 Shortness of breath: Secondary | ICD-10-CM | POA: Diagnosis not present

## 2021-08-25 DIAGNOSIS — G8929 Other chronic pain: Secondary | ICD-10-CM | POA: Diagnosis present

## 2021-08-25 DIAGNOSIS — I3139 Other pericardial effusion (noninflammatory): Secondary | ICD-10-CM | POA: Diagnosis not present

## 2021-08-25 DIAGNOSIS — E782 Mixed hyperlipidemia: Secondary | ICD-10-CM | POA: Diagnosis not present

## 2021-08-25 DIAGNOSIS — I10 Essential (primary) hypertension: Secondary | ICD-10-CM | POA: Diagnosis not present

## 2021-08-25 DIAGNOSIS — Z743 Need for continuous supervision: Secondary | ICD-10-CM | POA: Diagnosis not present

## 2021-08-25 DIAGNOSIS — I208 Other forms of angina pectoris: Secondary | ICD-10-CM | POA: Diagnosis not present

## 2021-08-25 DIAGNOSIS — I517 Cardiomegaly: Secondary | ICD-10-CM | POA: Diagnosis not present

## 2021-08-25 DIAGNOSIS — R0600 Dyspnea, unspecified: Secondary | ICD-10-CM | POA: Diagnosis not present

## 2021-08-25 DIAGNOSIS — I272 Pulmonary hypertension, unspecified: Secondary | ICD-10-CM | POA: Diagnosis not present

## 2021-08-25 LAB — TROPONIN I (HIGH SENSITIVITY)
Troponin I (High Sensitivity): 7 ng/L (ref <18)
Troponin I (High Sensitivity): 7 ng/L (ref <18)

## 2021-08-25 LAB — CBC
HCT: 35.7 % — ABNORMAL LOW (ref 36.0–46.0)
Hemoglobin: 11.9 g/dL — ABNORMAL LOW (ref 12.0–15.0)
MCH: 31.9 pg (ref 26.0–34.0)
MCHC: 33.3 g/dL (ref 30.0–36.0)
MCV: 95.7 fL (ref 80.0–100.0)
Platelets: 124 10*3/uL — ABNORMAL LOW (ref 150–400)
RBC: 3.73 MIL/uL — ABNORMAL LOW (ref 3.87–5.11)
RDW: 14 % (ref 11.5–15.5)
WBC: 3.9 10*3/uL — ABNORMAL LOW (ref 4.0–10.5)
nRBC: 0 % (ref 0.0–0.2)

## 2021-08-25 LAB — BASIC METABOLIC PANEL
Anion gap: 7 (ref 5–15)
BUN: 25 mg/dL — ABNORMAL HIGH (ref 8–23)
CO2: 23 mmol/L (ref 22–32)
Calcium: 8.4 mg/dL — ABNORMAL LOW (ref 8.9–10.3)
Chloride: 108 mmol/L (ref 98–111)
Creatinine, Ser: 0.77 mg/dL (ref 0.44–1.00)
GFR, Estimated: >60 mL/min (ref >60)
Glucose, Bld: 94 mg/dL (ref 70–99)
Potassium: 4.4 mmol/L (ref 3.5–5.1)
Sodium: 138 mmol/L (ref 135–145)

## 2021-08-25 NOTE — ED Triage Notes (Signed)
Pt presents to ER via ems from [facility].  Pt states she is having pain in her chest along with trouble breathing but the difficulty breathing is what is worrying her.  Pt states she has had trouble breathing for "a while."  Pt A&O x4 at this time.  No acute distress noted in triage

## 2021-08-25 NOTE — ED Notes (Signed)
First Nurse Note:  Patient in via [place] from [facility], call out for chest pain and shortness of breath x 1 week.  Per EMS, patient A/Ox4, vitals WDL.  Patient given 2 Nitro per facility prior to transportation.  Patient is non ambulatory upon arrival.  NAD noted at this time.

## 2021-08-26 ENCOUNTER — Other Ambulatory Visit: Payer: Self-pay

## 2021-08-26 ENCOUNTER — Inpatient Hospital Stay (HOSPITAL_COMMUNITY)
Admit: 2021-08-26 | Discharge: 2021-08-26 | Disposition: A | Discharge status: Home / Self Care | Payer: Medicare HMO | Attending: Internal Medicine | Admitting: Internal Medicine

## 2021-08-26 DIAGNOSIS — R0602 Shortness of breath: Secondary | ICD-10-CM | POA: Diagnosis present

## 2021-08-26 DIAGNOSIS — I509 Heart failure, unspecified: Secondary | ICD-10-CM

## 2021-08-26 DIAGNOSIS — I3139 Other pericardial effusion (noninflammatory): Secondary | ICD-10-CM

## 2021-08-26 DIAGNOSIS — M549 Dorsalgia, unspecified: Secondary | ICD-10-CM | POA: Diagnosis present

## 2021-08-26 DIAGNOSIS — I5043 Acute on chronic combined systolic (congestive) and diastolic (congestive) heart failure: Secondary | ICD-10-CM | POA: Diagnosis not present

## 2021-08-26 DIAGNOSIS — I10 Essential (primary) hypertension: Secondary | ICD-10-CM | POA: Diagnosis not present

## 2021-08-26 DIAGNOSIS — Z20822 Contact with and (suspected) exposure to covid-19: Secondary | ICD-10-CM | POA: Diagnosis present

## 2021-08-26 DIAGNOSIS — Z8673 Personal history of transient ischemic attack (TIA), and cerebral infarction without residual deficits: Secondary | ICD-10-CM | POA: Diagnosis not present

## 2021-08-26 DIAGNOSIS — I272 Pulmonary hypertension, unspecified: Secondary | ICD-10-CM | POA: Diagnosis not present

## 2021-08-26 DIAGNOSIS — Z90710 Acquired absence of both cervix and uterus: Secondary | ICD-10-CM | POA: Diagnosis not present

## 2021-08-26 DIAGNOSIS — Z8249 Family history of ischemic heart disease and other diseases of the circulatory system: Secondary | ICD-10-CM | POA: Diagnosis not present

## 2021-08-26 DIAGNOSIS — E785 Hyperlipidemia, unspecified: Secondary | ICD-10-CM | POA: Diagnosis present

## 2021-08-26 DIAGNOSIS — I5023 Acute on chronic systolic (congestive) heart failure: Secondary | ICD-10-CM

## 2021-08-26 DIAGNOSIS — F32A Depression, unspecified: Secondary | ICD-10-CM | POA: Diagnosis present

## 2021-08-26 DIAGNOSIS — I471 Supraventricular tachycardia: Secondary | ICD-10-CM | POA: Diagnosis present

## 2021-08-26 DIAGNOSIS — J90 Pleural effusion, not elsewhere classified: Secondary | ICD-10-CM | POA: Diagnosis not present

## 2021-08-26 DIAGNOSIS — I208 Other forms of angina pectoris: Secondary | ICD-10-CM | POA: Diagnosis not present

## 2021-08-26 DIAGNOSIS — R079 Chest pain, unspecified: Secondary | ICD-10-CM | POA: Diagnosis present

## 2021-08-26 DIAGNOSIS — I5031 Acute diastolic (congestive) heart failure: Secondary | ICD-10-CM | POA: Diagnosis not present

## 2021-08-26 DIAGNOSIS — Z96652 Presence of left artificial knee joint: Secondary | ICD-10-CM | POA: Diagnosis present

## 2021-08-26 DIAGNOSIS — Z981 Arthrodesis status: Secondary | ICD-10-CM | POA: Diagnosis not present

## 2021-08-26 DIAGNOSIS — G8929 Other chronic pain: Secondary | ICD-10-CM | POA: Diagnosis present

## 2021-08-26 DIAGNOSIS — D61818 Other pancytopenia: Secondary | ICD-10-CM | POA: Diagnosis present

## 2021-08-26 DIAGNOSIS — I420 Dilated cardiomyopathy: Secondary | ICD-10-CM | POA: Diagnosis not present

## 2021-08-26 DIAGNOSIS — I160 Hypertensive urgency: Secondary | ICD-10-CM | POA: Diagnosis present

## 2021-08-26 DIAGNOSIS — E782 Mixed hyperlipidemia: Secondary | ICD-10-CM | POA: Diagnosis not present

## 2021-08-26 DIAGNOSIS — N189 Chronic kidney disease, unspecified: Secondary | ICD-10-CM | POA: Diagnosis present

## 2021-08-26 DIAGNOSIS — I441 Atrioventricular block, second degree: Secondary | ICD-10-CM | POA: Diagnosis present

## 2021-08-26 DIAGNOSIS — I428 Other cardiomyopathies: Secondary | ICD-10-CM | POA: Diagnosis present

## 2021-08-26 DIAGNOSIS — I130 Hypertensive heart and chronic kidney disease with heart failure and stage 1 through stage 4 chronic kidney disease, or unspecified chronic kidney disease: Secondary | ICD-10-CM | POA: Diagnosis present

## 2021-08-26 DIAGNOSIS — Z66 Do not resuscitate: Secondary | ICD-10-CM | POA: Diagnosis not present

## 2021-08-26 DIAGNOSIS — I2510 Atherosclerotic heart disease of native coronary artery without angina pectoris: Secondary | ICD-10-CM | POA: Diagnosis present

## 2021-08-26 DIAGNOSIS — I5022 Chronic systolic (congestive) heart failure: Secondary | ICD-10-CM

## 2021-08-26 DIAGNOSIS — K219 Gastro-esophageal reflux disease without esophagitis: Secondary | ICD-10-CM | POA: Diagnosis present

## 2021-08-26 DIAGNOSIS — F419 Anxiety disorder, unspecified: Secondary | ICD-10-CM | POA: Diagnosis present

## 2021-08-26 DIAGNOSIS — G629 Polyneuropathy, unspecified: Secondary | ICD-10-CM | POA: Diagnosis present

## 2021-08-26 DIAGNOSIS — E039 Hypothyroidism, unspecified: Secondary | ICD-10-CM | POA: Diagnosis present

## 2021-08-26 LAB — BASIC METABOLIC PANEL
Anion gap: 6 (ref 5–15)
BUN: 23 mg/dL (ref 8–23)
CO2: 25 mmol/L (ref 22–32)
Calcium: 8.4 mg/dL — ABNORMAL LOW (ref 8.9–10.3)
Chloride: 108 mmol/L (ref 98–111)
Creatinine, Ser: 0.81 mg/dL (ref 0.44–1.00)
GFR, Estimated: >60 mL/min (ref >60)
Glucose, Bld: 89 mg/dL (ref 70–99)
Potassium: 3.9 mmol/L (ref 3.5–5.1)
Sodium: 139 mmol/L (ref 135–145)

## 2021-08-26 LAB — CBC
HCT: 34.7 % — ABNORMAL LOW (ref 36.0–46.0)
Hemoglobin: 11.1 g/dL — ABNORMAL LOW (ref 12.0–15.0)
MCH: 30.5 pg (ref 26.0–34.0)
MCHC: 32 g/dL (ref 30.0–36.0)
MCV: 95.3 fL (ref 80.0–100.0)
Platelets: 119 10*3/uL — ABNORMAL LOW (ref 150–400)
RBC: 3.64 MIL/uL — ABNORMAL LOW (ref 3.87–5.11)
RDW: 13.9 % (ref 11.5–15.5)
WBC: 3.5 10*3/uL — ABNORMAL LOW (ref 4.0–10.5)
nRBC: 0 % (ref 0.0–0.2)

## 2021-08-26 LAB — ECHOCARDIOGRAM COMPLETE
AR max vel: 1.07 cm2
AV Peak grad: 9.9 mmHg
Ao pk vel: 1.57 m/s
Area-P 1/2: 3.15 cm2
Calc EF: 27.9 %
Height: 64 in
S' Lateral: 4.4 cm
Single Plane A2C EF: 30.1 %
Single Plane A4C EF: 25.9 %
Weight: 2539.7 oz

## 2021-08-26 LAB — MRSA NEXT GEN BY PCR, NASAL: MRSA by PCR Next Gen: NOT DETECTED

## 2021-08-26 LAB — RESP PANEL BY RT-PCR (FLU A&B, COVID) ARPGX2
Influenza A by PCR: NEGATIVE
Influenza B by PCR: NEGATIVE
SARS Coronavirus 2 by RT PCR: NEGATIVE

## 2021-08-26 LAB — BRAIN NATRIURETIC PEPTIDE: B Natriuretic Peptide: 353.9 pg/mL — ABNORMAL HIGH (ref 0.0–100.0)

## 2021-08-26 LAB — MAGNESIUM: Magnesium: 1.7 mg/dL (ref 1.7–2.4)

## 2021-08-26 MED ORDER — ACETAMINOPHEN 325 MG PO TABS
650.0000 mg | ORAL_TABLET | Freq: Four times a day (QID) | ORAL | Status: DC | PRN
  Administered 2021-08-26 – 2021-08-29 (×6): 650 mg via ORAL
  Filled 2021-08-26 (×6): qty 2

## 2021-08-26 MED ORDER — LORATADINE 10 MG PO TABS
10.0000 mg | ORAL_TABLET | Freq: Every day | ORAL | Status: DC
  Administered 2021-08-26 – 2021-08-30 (×5): 10 mg via ORAL
  Filled 2021-08-26 (×5): qty 1

## 2021-08-26 MED ORDER — MIRABEGRON ER 50 MG PO TB24
50.0000 mg | ORAL_TABLET | Freq: Every day | ORAL | Status: DC
  Administered 2021-08-26 – 2021-08-30 (×5): 50 mg via ORAL
  Filled 2021-08-26 (×5): qty 1

## 2021-08-26 MED ORDER — VITAMIN B-12 1000 MCG PO TABS
1000.0000 ug | ORAL_TABLET | Freq: Every day | ORAL | Status: DC
  Administered 2021-08-26 – 2021-08-30 (×5): 1000 ug via ORAL
  Filled 2021-08-26 (×5): qty 1

## 2021-08-26 MED ORDER — NITROGLYCERIN 2 % TD OINT
0.5000 [in_us] | TOPICAL_OINTMENT | Freq: Once | TRANSDERMAL | Status: AC
  Administered 2021-08-26: 0.5 [in_us] via TOPICAL
  Filled 2021-08-26: qty 1

## 2021-08-26 MED ORDER — ESCITALOPRAM OXALATE 10 MG PO TABS
5.0000 mg | ORAL_TABLET | ORAL | Status: DC
  Administered 2021-08-26 – 2021-08-30 (×5): 5 mg via ORAL
  Filled 2021-08-26 (×5): qty 0.5

## 2021-08-26 MED ORDER — PANTOPRAZOLE SODIUM 40 MG PO TBEC
40.0000 mg | DELAYED_RELEASE_TABLET | Freq: Every day | ORAL | Status: DC
  Administered 2021-08-27 – 2021-08-30 (×4): 40 mg via ORAL
  Filled 2021-08-26 (×4): qty 1

## 2021-08-26 MED ORDER — FUROSEMIDE 10 MG/ML IJ SOLN
20.0000 mg | Freq: Once | INTRAMUSCULAR | Status: AC
  Administered 2021-08-26: 20 mg via INTRAVENOUS
  Filled 2021-08-26: qty 4

## 2021-08-26 MED ORDER — LEVOTHYROXINE SODIUM 88 MCG PO TABS
88.0000 ug | ORAL_TABLET | Freq: Every day | ORAL | Status: DC
  Administered 2021-08-26 – 2021-08-30 (×5): 88 ug via ORAL
  Filled 2021-08-26 (×5): qty 1

## 2021-08-26 MED ORDER — PREGABALIN 75 MG PO CAPS
75.0000 mg | ORAL_CAPSULE | Freq: Three times a day (TID) | ORAL | Status: DC
  Administered 2021-08-26 – 2021-08-30 (×11): 75 mg via ORAL
  Filled 2021-08-26: qty 3
  Filled 2021-08-26: qty 1
  Filled 2021-08-26: qty 3
  Filled 2021-08-26 (×2): qty 1
  Filled 2021-08-26 (×2): qty 3
  Filled 2021-08-26: qty 1
  Filled 2021-08-26 (×3): qty 3
  Filled 2021-08-26: qty 1

## 2021-08-26 MED ORDER — TRAZODONE HCL 50 MG PO TABS
50.0000 mg | ORAL_TABLET | Freq: Every day | ORAL | Status: DC
  Administered 2021-08-26 – 2021-08-29 (×4): 50 mg via ORAL
  Filled 2021-08-26 (×4): qty 1

## 2021-08-26 MED ORDER — POLYETHYLENE GLYCOL 3350 17 GM/SCOOP PO POWD
17.0000 g | Freq: Every day | ORAL | Status: DC | PRN
  Filled 2021-08-26: qty 255

## 2021-08-26 MED ORDER — ENOXAPARIN SODIUM 40 MG/0.4ML IJ SOSY
40.0000 mg | PREFILLED_SYRINGE | INTRAMUSCULAR | Status: DC
  Administered 2021-08-26 – 2021-08-30 (×5): 40 mg via SUBCUTANEOUS
  Filled 2021-08-26 (×5): qty 0.4

## 2021-08-26 MED ORDER — LIDOCAINE 5 % EX PTCH
1.0000 | MEDICATED_PATCH | Freq: Every day | CUTANEOUS | Status: DC | PRN
  Administered 2021-08-27: 1 via TRANSDERMAL
  Filled 2021-08-26 (×2): qty 2

## 2021-08-26 MED ORDER — ALPRAZOLAM 0.5 MG PO TABS
0.5000 mg | ORAL_TABLET | Freq: Two times a day (BID) | ORAL | Status: DC | PRN
  Administered 2021-08-28 – 2021-08-30 (×2): 0.5 mg via ORAL
  Filled 2021-08-26 (×2): qty 1

## 2021-08-26 MED ORDER — ASPIRIN EC 81 MG PO TBEC
81.0000 mg | DELAYED_RELEASE_TABLET | Freq: Every day | ORAL | Status: DC
  Administered 2021-08-26 – 2021-08-29 (×4): 81 mg via ORAL
  Filled 2021-08-26 (×4): qty 1

## 2021-08-26 MED ORDER — ASPIRIN 81 MG PO CHEW
324.0000 mg | CHEWABLE_TABLET | Freq: Once | ORAL | Status: AC
  Administered 2021-08-26: 324 mg via ORAL
  Filled 2021-08-26: qty 4

## 2021-08-26 MED ORDER — ACETAMINOPHEN 650 MG RE SUPP: 650.0000 mg | Freq: Four times a day (QID) | RECTAL | Status: DC | PRN

## 2021-08-26 MED ORDER — VITAMIN D 25 MCG (1000 UNIT) PO TABS
5000.0000 [IU] | ORAL_TABLET | Freq: Every day | ORAL | Status: DC
  Administered 2021-08-27 – 2021-08-30 (×4): 5000 [IU] via ORAL
  Filled 2021-08-26 (×4): qty 5

## 2021-08-26 MED ORDER — ATORVASTATIN CALCIUM 20 MG PO TABS
20.0000 mg | ORAL_TABLET | Freq: Every day | ORAL | Status: DC
  Administered 2021-08-26 – 2021-08-30 (×5): 20 mg via ORAL
  Filled 2021-08-26 (×5): qty 1

## 2021-08-26 MED ORDER — CARVEDILOL 6.25 MG PO TABS
6.2500 mg | ORAL_TABLET | Freq: Two times a day (BID) | ORAL | Status: DC
  Administered 2021-08-26 – 2021-08-28 (×6): 6.25 mg via ORAL
  Filled 2021-08-26 (×7): qty 1

## 2021-08-26 MED ORDER — FUROSEMIDE 10 MG/ML IJ SOLN
40.0000 mg | Freq: Two times a day (BID) | INTRAMUSCULAR | Status: DC
  Administered 2021-08-26 – 2021-08-28 (×4): 40 mg via INTRAVENOUS
  Filled 2021-08-26 (×4): qty 4

## 2021-08-26 MED ORDER — MONTELUKAST SODIUM 10 MG PO TABS
10.0000 mg | ORAL_TABLET | Freq: Every day | ORAL | Status: DC
  Administered 2021-08-26 – 2021-08-29 (×4): 10 mg via ORAL
  Filled 2021-08-26 (×4): qty 1

## 2021-08-26 MED ORDER — MELATONIN 5 MG PO TABS
5.0000 mg | ORAL_TABLET | Freq: Every day | ORAL | Status: DC
Start: 2021-08-26 — End: 2021-08-30
  Administered 2021-08-26 – 2021-08-29 (×4): 5 mg via ORAL
  Filled 2021-08-26 (×4): qty 1

## 2021-08-26 MED ORDER — FOLIC ACID 1 MG PO TABS
0.5000 mg | ORAL_TABLET | Freq: Every day | ORAL | Status: DC
  Administered 2021-08-27 – 2021-08-30 (×4): 0.5 mg via ORAL
  Filled 2021-08-26 (×4): qty 1

## 2021-08-26 MED ORDER — ONDANSETRON HCL 4 MG/2ML IJ SOLN: 4.0000 mg | Freq: Four times a day (QID) | INTRAMUSCULAR | Status: DC | PRN

## 2021-08-26 MED ORDER — LISINOPRIL 10 MG PO TABS
10.0000 mg | ORAL_TABLET | Freq: Every day | ORAL | Status: DC
  Administered 2021-08-26 – 2021-08-27 (×2): 10 mg via ORAL
  Filled 2021-08-26 (×2): qty 1

## 2021-08-26 MED ORDER — ONDANSETRON HCL 4 MG PO TABS
4.0000 mg | ORAL_TABLET | Freq: Four times a day (QID) | ORAL | Status: DC | PRN
Start: 2021-08-26 — End: 2021-08-30

## 2021-08-26 MED ORDER — FUROSEMIDE 10 MG/ML IJ SOLN
20.0000 mg | Freq: Two times a day (BID) | INTRAMUSCULAR | Status: DC
  Administered 2021-08-26: 20 mg via INTRAVENOUS
  Filled 2021-08-26: qty 4

## 2021-08-26 NOTE — H&P (Signed)
2+ History and Physical    [name] CBJ:[id number] DOB: April 15, 1946 DOA: 08/25/2021  PCP: Virgie Dad, FNP   Patient coming from: SNF  I have personally briefly reviewed patient's relevant medical records in [place]  Chief Complaint: Chest pain and shortness of breath  HPI: [facility] is a 76 y.o. female with medical history significant for Chronic back pain s/p thoracic spinal fusion June 2022, HTN, hypothyroidism, anxiety and depression, recurrent chest pain with low risk stress this 09/2020, followed by cardiology and with no prior history of CHF who presents to the ED from [facility] with chest pain and shortness of breath.  She has increasing lower extremity edema but denies orthopnea.  She denies cough, fever or chills.  She has no nausea, vomiting, abdominal pain or diarrhea  ED course: On arrival BP 123/108-150 3/112 with otherwise normal vitals Blood work: Troponin 7-7 and BNP 353 WBC 3900, hemoglobin 11.9 and platelets 124  EKG, personally viewed and interpreted: Irregularly irregular rhythm, undetermined but without acute ST or T wave changes  Chest x-ray: Moderate bilateral pleural effusions and mild cardiomegaly  Patient given IV Lasix, Nitropaste and chewable aspirin.  Hospitalist consulted for admission.   Review of Systems: As per HPI otherwise all other systems on review of systems negative.   Assessment/Plan    Acute CHF (congestive heart failure) (HCC)   Bilateral pleural effusion - Possibly related to uncontrolled blood pressure - IV Lasix, nitroglycerin ointment - Lisinopril, metoprolol - Daily weights with intake and output monitoring - Echocardiogram - Cardiology consult    Chest pain - Troponin negative and EKG nonacute and low risk stress test in February 2022 - Echo to evaluate for wall motion abnormality  Hypertensive urgency -Lisinopril, metoprolol plus IV Lasix and nitroglycerin - Adjust medications to achieve  goal   pancytopenia ([place]) - Uncertain etiology - Continue to monitor - Consider hematology consult or outpatient referral if persistent  Hypothyroidism - Continue levothyroxine    Anxiety and depression - Continue home Lexapro    Chronic back pain with history of thoracic spinal fusion 01/2021 -Continue pregabalin   DVT prophylaxis: Lovenox  Code Status: full code  Family Communication:  none  Disposition Plan: Back to previous home environment Consults called: Cardiology Status:At the time of admission, it appears that the appropriate admission status for this patient is INPATIENT. This is judged to be reasonable and necessary in order to provide the required intensity of service to ensure the patient's safety given the presenting symptoms, physical exam findings, and initial radiographic and laboratory data in the context of their  Comorbid conditions.   Patient requires inpatient status due to high intensity of service, high risk for further deterioration and high frequency of surveillance required.   I certify that at the point of admission it is my clinical judgment that the patient will require inpatient hospital care spanning beyond 2 midnights     Physical Exam: Vitals:   08/26/21 0100 08/26/21 0130 08/26/21 0200 08/26/21 0230  BP: (!) 126/107 126/78 112/78 100/88  Pulse: 78 70 75 78  Resp: _0 Temp:      TempSrc:      SpO2: 96% 99% 98% 96%  Weight:      Height:       Constitutional: Alert, oriented x 3 . Not in any apparent distress HEENT:      Head: Normocephalic and atraumatic.         Eyes: PERLA, EOMI,  Conjunctivae are normal. Sclera is non-icteric.       Mouth/Throat: Mucous membranes are moist.       Neck: Supple with no signs of meningismus. Cardiovascular: Regular rate and rhythm. No murmurs, gallops, or rubs. 2+ symmetrical distal pulses are present . No JVD. No  LE edema Respiratory: Respiratory effort increased with diminished lung sounds at  bases Gastrointestinal: Soft, non tender, non distended. Positive bowel sounds.  Genitourinary: No CVA tenderness. Musculoskeletal: Nontender with normal range of motion in all extremities. No cyanosis, or erythema of extremities. Neurologic:  Face is symmetric. Moving all extremities. No gross focal neurologic deficits . Skin: Skin is warm, dry.  No rash or ulcers Psychiatric: Mood and affect are appropriate     Past Medical History:  Diagnosis Date   Allergy    Anxiety    Arthritis    Back pain    Chronic kidney disease    STAGE 3   Coronary artery disease 04/2017   Mild to moderate CAD in LAD/diagonal by CTA (CT-FFR of apical LAD 0.79).   DDD (degenerative disc disease), lumbar    Depression    Diverticulosis    Essential hypertension    Normal cardiolite 05/2006 EF 71%   GERD (gastroesophageal reflux disease)    Headache    History of shingles    Hyperlipidemia    Hypothyroidism    Lung nodule    Mini stroke 2011    Occipital neuralgia    Palpitations    Pneumonia 2018   Prediabetes    Stroke [facility])    Stroke ([place])    MRI 04/2008 + left sup. frontal gyrus possibly puntate infarct    Syncope 2019   Urinary tract infection    Vitamin D deficiency     Past Surgical History:  Procedure Laterality Date   ABDOMINAL HYSTERECTOMY     BLADDER SURGERY     2003   BREAST EXCISIONAL BIOPSY Right Over 20 years    Benign   CHOLECYSTECTOMY     gastroplication      JOINT REPLACEMENT Left    KNEE   KNEE ARTHROSCOPY Left 2011   PULSE GENERATOR IMPLANT Left 01/31/2020   Procedure: PLACEMENT RIGHT FLANK PULSE GENERATOR VS REMOVAL SPINAL CORD STIMULATOR;  Surgeon: Deetta Perla, MD;  Location: [facility] ORS;  Service: Neurosurgery;  Laterality: Left;   PULSE GENERATOR IMPLANT Left 04/24/2020   Procedure: REPLACEMENT LEFT FLANK PULSE GENERATOR IMPLANT;  Surgeon: Deetta Perla, MD;  Location: [facility] ORS;  Service: Neurosurgery;  Laterality: Left;  MAC w/ local   right arm fracture      SPINAL CORD STIMULATOR REMOVAL N/A 06/26/2020   Procedure: SPINAL CORD STIMULATOR REMOVAL;  Surgeon: Deetta Perla, MD;  Location: [facility] ORS;  Service: Neurosurgery;  Laterality: N/A;   THORACIC LAMINECTOMY FOR SPINAL CORD STIMULATOR N/A 01/24/2020   Procedure: THORACIC SPINAL CORD STIMULATOR PADDLE TRIAL VIA LAMINECTOMY;  Surgeon: Deetta Perla, MD;  Location: [facility] ORS;  Service: Neurosurgery;  Laterality: N/A;   TONSILLECTOMY AND ADENOIDECTOMY     TOTAL KNEE ARTHROPLASTY Left 04/17/2015   Procedure: LEFT TOTAL KNEE ARTHROPLASTY;  Surgeon: Paralee Cancel, MD;  Location: [facility] ORS;  Service: Orthopedics;  Laterality: Left;     reports that she is a non-smoker but has been exposed to tobacco smoke. She has never used smokeless tobacco. She reports that she does not drink alcohol and does not use drugs.  No Known Allergies  Family History  Problem Relation Age of Onset   Heart disease Mother  Hypertension Mother    Diabetes Mother    Heart attack Mother 64   Heart disease Father    Heart attack Father 67   Breast cancer Maternal Aunt    Heart attack Brother       Prior to Admission medications   Medication Sig Start Date End Date Taking? Authorizing Provider  acetaminophen (TYLENOL) 500 MG tablet Take 1,000 mg by mouth every 8 (eight) hours as needed for moderate pain.    [provider]  alendronate (FOSAMAX) 70 MG tablet Take 70 mg by mouth once a week.  10/23/19   [provider]  ascorbic acid (VITAMIN C) 500 MG tablet Take 500 mg by mouth daily.    [provider]  aspirin EC 81 MG tablet Take 1 tablet (81 mg total) by mouth at bedtime. Swallow whole. 10/25/20   McLean-Scocuzza, Nino Glow, MD  atorvastatin (LIPITOR) 20 MG tablet Take 1 tablet (20 mg total) by mouth daily. 06/20/20   McLean-Scocuzza, Nino Glow, MD  Biotin 1 MG CAPS Take 1 mg by mouth daily.     [provider]  cetirizine (ZYRTEC) 10 MG tablet Take 10 mg by mouth every morning.    [provider]  Cholecalciferol (VITAMIN D3) 125 MCG (5000 UT) TABS Take 1 tablet (5,000 Units total) by mouth daily. 10/09/18   McLean-Scocuzza, Nino Glow, MD  Cranberry 450 MG CAPS Take 1 capsule by mouth daily.    [provider]  diltiazem (CARDIZEM SR) 60 MG 12 hr capsule Take 1 capsule (60 mg total) by mouth 2 (two) times daily. 06/23/20   McLean-Scocuzza, Nino Glow, MD  escitalopram (LEXAPRO) 5 MG tablet Take 1 tablet (5 mg total) by mouth every morning. 06/23/20   McLean-Scocuzza, Nino Glow, MD  folic acid (FOLVITE) 413 MCG tablet Take 1 tablet (400 mcg total) by mouth daily. SEPARATE ALL SUPPLEMENTS TO LUNCH OR DINNER AND PRILOSEC NOT TO MESS W/THYROID MED 12/10/18   McLean-Scocuzza, Nino Glow, MD  levothyroxine (SYNTHROID) 88 MCG tablet Take 1 tablet (88 mcg total) by mouth daily before breakfast. Skip sundays 12/02/19   McLean-Scocuzza, Nino Glow, MD  lidocaine (LIDODERM) 5 % Place 1-2 patches onto the skin daily as needed (pain). Remove & Discard patch within 12 hours or as directed by MD    [provider]  melatonin 5 MG TABS Take 1 tablet (5 mg total) by mouth at bedtime. 04/04/21   Val Riles, MD  montelukast (SINGULAIR) 10 MG tablet Take 1 tablet (10 mg total) by mouth at bedtime. 02/14/20   McLean-Scocuzza, Nino Glow, MD  Multiple Vitamin (MULTIVITAMIN WITH MINERALS) TABS tablet Take 1 tablet by mouth daily.    [provider]  MYRBETRIQ 50 MG TB24 tablet TAKE ONE TABLET EVERY DAY 10/20/20   Debroah Loop, PA-C  omeprazole (PRILOSEC) 40 MG capsule Take 1 capsule (40 mg total) by mouth daily. After lunch 06/23/20   McLean-Scocuzza, Nino Glow, MD  polyethylene glycol powder (GLYCOLAX/MIRALAX) 17 GM/SCOOP powder Take 17 g by mouth daily as needed for moderate constipation. 10/21/19   McLean-Scocuzza, Nino Glow, MD  pregabalin (LYRICA) 75 MG capsule Take 75 mg by mouth 3 (three) times daily.    [provider]  traZODone (DESYREL) 50 MG tablet TAKE 2 TABLETS EVERY  DAY 1 HOUR BEFORE BED 09/11/20   McLean-Scocuzza, Nino Glow, MD  vitamin B-12 (CYANOCOBALAMIN) 1000 MCG tablet Take 1 tablet (1,000 mcg total) by mouth daily. 10/09/18   McLean-Scocuzza, Nino Glow, MD  Labs on Admission: I have personally reviewed following labs and imaging studies  CBC: Recent Labs  Lab 08/25/21 1913  WBC 3.9*  HGB 11.9*  HCT 35.7*  MCV 95.7  PLT 157*   Basic Metabolic Panel: Recent Labs  Lab 08/25/21 1913  NA 138  K 4.4  CL 108  CO2 23  GLUCOSE 94  BUN 25*  CREATININE 0.77  CALCIUM 8.4*   GFR: Estimated Creatinine Clearance: 58.2 mL/min (by C-G formula based on SCr of 0.77 mg/dL). Liver Function Tests: No results for input(s): AST, ALT, ALKPHOS, BILITOT, PROT, ALBUMIN in the last 168 hours. No results for input(s): LIPASE, AMYLASE in the last 168 hours. No results for input(s): AMMONIA in the last 168 hours. Coagulation Profile: No results for input(s): INR, PROTIME in the last 168 hours. Cardiac Enzymes: No results for input(s): CKTOTAL, CKMB, CKMBINDEX, TROPONINI in the last 168 hours. BNP (last 3 results) No results for input(s): PROBNP in the last 8760 hours. HbA1C: No results for input(s): HGBA1C in the last 72 hours. CBG: No results for input(s): GLUCAP in the last 168 hours. Lipid Profile: No results for input(s): CHOL, HDL, LDLCALC, TRIG, CHOLHDL, LDLDIRECT in the last 72 hours. Thyroid Function Tests: No results for input(s): TSH, T4TOTAL, FREET4, T3FREE, THYROIDAB in the last 72 hours. Anemia Panel: No results for input(s): VITAMINB12, FOLATE, FERRITIN, TIBC, IRON, RETICCTPCT in the last 72 hours. Urine analysis:    Component Value Date/Time   COLORURINE YELLOW (A) 03/31/2021 1919   APPEARANCEUR TURBID (A) 03/31/2021 1919   APPEARANCEUR Clear 11/30/2019 1340   LABSPEC 1.016 03/31/2021 1919   PHURINE 6.0 03/31/2021 1919   GLUCOSEU NEGATIVE 03/31/2021 1919   GLUCOSEU NEGATIVE 12/16/2017 1504   HGBUR SMALL (A) 03/31/2021 1919    BILIRUBINUR NEGATIVE 03/31/2021 1919   BILIRUBINUR Negative 11/30/2019 1340   KETONESUR 5 (A) 03/31/2021 1919   PROTEINUR 100 (A) 03/31/2021 1919   UROBILINOGEN 0.2 12/16/2017 1504   NITRITE NEGATIVE 03/31/2021 1919   LEUKOCYTESUR MODERATE (A) 03/31/2021 1919    Radiological Exams on Admission: DG Chest 2 View  Result Date: 08/25/2021 CLINICAL DATA:  Dyspnea EXAM: CHEST - 2 VIEW COMPARISON:  03/31/2021 FINDINGS: Moderate bilateral pleural effusions are present with resultant mild retrocardiac opacification. No superimposed focal pulmonary infiltrate. No pneumothorax. Mild cardiomegaly is present, accentuated by seated positioning. Pulmonary vascularity is normal. No acute bone abnormality. Extensive thoracolumbar fusion instrumentation again noted. IMPRESSION: Moderate bilateral pleural effusions. Mild cardiomegaly. Electronically Signed   By: Fidela Salisbury M.D.   On: 08/25/2021 19:56       Athena Masse MD [facility]   08/26/2021, 2:36 AM

## 2021-08-26 NOTE — Progress Notes (Signed)
Patient ID: Kimberly Cook, female   DOB: 1946-01-06, 76 y.o.   MRN: [id number] [facility] PROGRESS NOTE  Kimberly Cook WUJ:[id number] DOB: [birth year] DOA: 08/25/2021 PCP: Kimberly [place], FNP  HPI/Subjective: Patient still not feeling well.  Still with some shortness of breath.  Feels exhausted.  Hurts when she tries to breathe.  Occasionally has a wheeze.  She decided she wants to be a DO NOT RESUSCITATE.    Objective: Vitals:   08/26/21 1034 08/26/21 1141  BP: 100/72 103/82  Pulse: 80 69  Resp: 17 17  Temp: 97.8 F (36.6 C) 98 F (36.7 C)  SpO2: 97% 97%    Intake/Output Summary (Last 24 hours) at 08/26/2021 1544 Last data filed at 08/26/2021 1424 Gross per 24 hour  Intake 480 ml  Output 1800 ml  Net -1320 ml   Filed Weights   08/25/21 1903  Weight: 72 kg    ROS: Review of Systems  Respiratory:  Positive for shortness of breath.   Cardiovascular:  Positive for chest pain.  Gastrointestinal:  Negative for abdominal pain, nausea and vomiting.  Exam: Physical Exam HENT:     Head: Normocephalic.     Mouth/Throat:     Pharynx: No oropharyngeal exudate.  Eyes:     General: Lids are normal.     Conjunctiva/sclera: Conjunctivae normal.  Cardiovascular:     Rate and Rhythm: Normal rate and regular rhythm.     Heart sounds: Normal heart sounds, S1 normal and S2 normal.  Pulmonary:     Breath sounds: Examination of the right-lower field reveals decreased breath sounds and rales. Examination of the left-lower field reveals decreased breath sounds and rales. Decreased breath sounds and rales present. No wheezing or rhonchi.  Abdominal:     Palpations: Abdomen is soft.     Tenderness: There is no abdominal tenderness.  Musculoskeletal:     Right lower leg: Swelling present.     Left lower leg: Swelling present.  Skin:    General: Skin is warm.     Findings: No rash.  Neurological:     Mental Status: She is alert and oriented to person, place, and time.       Scheduled Meds:  aspirin EC  81 mg Oral QHS   atorvastatin  20 mg Oral Daily   carvedilol  6.25 mg Oral BID WC   cholecalciferol  5,000 Units Oral Daily   enoxaparin (LOVENOX) injection  40 mg Subcutaneous Q24H   escitalopram  5 mg Oral BH-q7a   folic acid  0.5 mg Oral Daily   furosemide  20 mg Intravenous BID   levothyroxine  88 mcg Oral QAC breakfast   lisinopril  10 mg Oral Daily   loratadine  10 mg Oral Daily   melatonin  5 mg Oral QHS   mirabegron ER  50 mg Oral Daily   montelukast  10 mg Oral QHS   pantoprazole  40 mg Oral Daily   pregabalin  75 mg Oral TID   traZODone  50 mg Oral QHS   vitamin B-12  1,000 mcg Oral Daily     Assessment/Plan:  Acute on chronic systolic congestive heart failure.  EF 30 to 35% increase IV Lasix to 40 mg IV twice daily.  Continue lisinopril and metoprolol. Bilateral pleural effusions.  Repeat chest x-ray tomorrow after diuresis today.  Will evaluate tomorrow on whether thoracentesis is indicated or not. Essential hypertension.  Initially blood pressure was high with difficulty breathing.  Now blood pressure improved  on lisinopril metoprolol and Lasix. Pancytopenia.  Continue to monitor.  Recheck hepatitis C level. Weakness.  Patient has been in rehab since her back surgery in June.  We will get PT and OT consultations Hypothyroidism unspecified on levothyroxine Neuropathy on gabapentin Hyperlipidemia unspecified on atorvastatin Depression on Lexapro Previous history of completed stroke on aspirin and Lipitor. Patient request to be a DO NOT RESUSCITATE.        Code Status:     Code Status Orders  (From admission, onward)           Start     Ordered   08/26/21 1139  Do not attempt resuscitation (DNR)  Continuous       Question Answer Comment  In the event of cardiac or respiratory ARREST Do not call a code blue   In the event of cardiac or respiratory ARREST Do not perform Intubation, CPR, defibrillation or ACLS   In  the event of cardiac or respiratory ARREST Use medication by any route, position, wound care, and other measures to relive pain and suffering. May use oxygen, suction and manual treatment of airway obstruction as needed for comfort.   Comments nurse may pronounce      08/26/21 1138           Code Status History     Date Active Date Inactive Code Status Order ID Comments User Context   08/26/2021 0246 08/26/2021 1138 Full Code [id number]  Andris Baumann, MD ED   04/01/2021 0224 04/04/2021 2200 DNR [id number]  Arville Care, Vernetta Honey, MD Inpatient   03/31/2021 2143 04/01/2021 0224 Full Code [id number]  Mansy, Vernetta Honey, MD ED   01/24/2020 1738 01/25/2020 1907 Full Code [id number]  Patsey Berthold, NP Inpatient   12/13/2015 2059 12/14/2015 2049 Full Code [id number]  Rhetta Mura, MD Inpatient   04/17/2015 1712 04/19/2015 1446 Full Code [id number]  Shelly Coss Inpatient      Advance Directive Documentation    Flowsheet Row Most Recent Value  Type of Advance Directive Out of facility DNR (pink MOST or yellow form)  Pre-existing out of facility DNR order (yellow form or pink MOST form) --  "MOST" Form in Place? --      Family Communication: Spoke with son Kimberly Cook on the phone Disposition Plan: Status is: Inpatient  Will need a few days of diuresis here in the hospital with IV Lasix.  Kimberly Cook [employer]  [facility] [employer]

## 2021-08-26 NOTE — ED Notes (Signed)
Pt to be admitted to room 242 per MD order. Report given to Gaffer. VSS at this time, belongings sent with pt.pt transported by tech via stretcher.

## 2021-08-26 NOTE — Consult Note (Addendum)
Cardiology Consultation:   Patient ID: Kimberly Cook MRN: [id number]; DOB: 31-Mar-1946  Admit date: 08/25/2021 Date of Consult: 08/26/2021  PCP:  Virgie Dad, [place] Providers Cardiologist:  Nelva Bush, MD   { Physician requesting consult: Dr. Leslye Peer Reason for consult: Shortness of breath/congestive heart failure   Patient Profile:   Kimberly Cook is a 76 y.o. female with a hx of moderate coronary artery disease by cardiac CTA 2018, hypertension, hyperlipidemia, " mini strokes" known to our office last seen July 06, 2021 for shortness of breath, presented to the hospital with worsening shortness of breath, ankle swelling   History of Present Illness:   Ms. Klinkner reports that she lives at [facility], has been seen through our clinic for periodic chest pain and shortness of breath.  Reports symptoms have been worse since last clinic visit, particularly the past week.  Reports that she drinks a lot of ginger ale.   This presentation to the emergency room reported having pain in her chest along with trouble breathing Some days feet and ankles look like " grapefruit", very swollen  Report that she ambulates with a walker, sedentary at baseline  In the emergency room BNP 350  EKG with normal sinus rhythm, PACs noted X-ray with moderate bilateral pleural effusions mild cardiomegaly She was treated with IV Lasix, Nitropaste still in place this morning, aspirin  -1.3 L so far Very mild improvement in her symptoms, still short of breath, on nasal cannula oxygen  Past Medical History:  Diagnosis Date   Allergy    Anxiety    Arthritis    Back pain    Chronic kidney disease    STAGE 3   Coronary artery disease 04/2017   Mild to moderate CAD in LAD/diagonal by CTA (CT-FFR of apical LAD 0.79).   DDD (degenerative disc disease), lumbar    Depression    Diverticulosis    Essential hypertension    Normal cardiolite 05/2006 EF 71%   GERD  (gastroesophageal reflux disease)    Headache    History of shingles    Hyperlipidemia    Hypothyroidism    Lung nodule    Mini stroke 2011    Occipital neuralgia    Palpitations    Pneumonia 2018   Prediabetes    Stroke [facility])    Stroke ([place])    MRI 04/2008 + left sup. frontal gyrus possibly puntate infarct    Syncope 2019   Urinary tract infection    Vitamin D deficiency     Past Surgical History:  Procedure Laterality Date   ABDOMINAL HYSTERECTOMY     BLADDER SURGERY     2003   BREAST EXCISIONAL BIOPSY Right Over 20 years    Benign   CHOLECYSTECTOMY     gastroplication      JOINT REPLACEMENT Left    KNEE   KNEE ARTHROSCOPY Left 2011   PULSE GENERATOR IMPLANT Left 01/31/2020   Procedure: PLACEMENT RIGHT FLANK PULSE GENERATOR VS REMOVAL SPINAL CORD STIMULATOR;  Surgeon: Deetta Perla, MD;  Location: [facility] ORS;  Service: Neurosurgery;  Laterality: Left;   PULSE GENERATOR IMPLANT Left 04/24/2020   Procedure: REPLACEMENT LEFT FLANK PULSE GENERATOR IMPLANT;  Surgeon: Deetta Perla, MD;  Location: [facility] ORS;  Service: Neurosurgery;  Laterality: Left;  MAC w/ local   right arm fracture     SPINAL CORD STIMULATOR REMOVAL N/A 06/26/2020   Procedure: SPINAL CORD STIMULATOR REMOVAL;  Surgeon: Deetta Perla, MD;  Location: [facility] ORS;  Service: Neurosurgery;  Laterality: N/A;   THORACIC LAMINECTOMY FOR SPINAL CORD STIMULATOR N/A 01/24/2020   Procedure: THORACIC SPINAL CORD STIMULATOR PADDLE TRIAL VIA LAMINECTOMY;  Surgeon: Deetta Perla, MD;  Location: [facility] ORS;  Service: Neurosurgery;  Laterality: N/A;   TONSILLECTOMY AND ADENOIDECTOMY     TOTAL KNEE ARTHROPLASTY Left 04/17/2015   Procedure: LEFT TOTAL KNEE ARTHROPLASTY;  Surgeon: Paralee Cancel, MD;  Location: [facility] ORS;  Service: Orthopedics;  Laterality: Left;     Home Medications:  Prior to Admission medications   Medication Sig Start Date End Date Taking? Authorizing Provider  alendronate (FOSAMAX) 70 MG tablet Take 70 mg by mouth once a  week.  10/23/19  Yes [provider]  ALPRAZolam Duanne Moron) 0.5 MG tablet Take 0.5 mg by mouth 2 (two) times daily as needed. 08/21/21  Yes [provider]  ascorbic acid (VITAMIN C) 500 MG tablet Take 500 mg by mouth daily.   Yes [provider]  aspirin EC 81 MG tablet Take 1 tablet (81 mg total) by mouth at bedtime. Swallow whole. 10/25/20  Yes McLean-Scocuzza, Nino Glow, MD  atorvastatin (LIPITOR) 20 MG tablet Take 1 tablet (20 mg total) by mouth daily. 06/20/20  Yes McLean-Scocuzza, Nino Glow, MD  Biotin 1 MG CAPS Take 1 mg by mouth daily.    Yes [provider]  cetirizine (ZYRTEC) 10 MG tablet Take 10 mg by mouth every morning.   Yes [provider]  Cholecalciferol (VITAMIN D3) 125 MCG (5000 UT) TABS Take 1 tablet (5,000 Units total) by mouth daily. 10/09/18  Yes McLean-Scocuzza, Nino Glow, MD  Cranberry 450 MG CAPS Take 1 capsule by mouth daily.   Yes [provider]  diltiazem (CARDIZEM) 60 MG tablet Take 60 mg by mouth 2 (two) times daily. 05/05/21  Yes [provider]  escitalopram (LEXAPRO) 5 MG tablet Take 1 tablet (5 mg total) by mouth every morning. 06/23/20  Yes McLean-Scocuzza, Nino Glow, MD  folic acid (FOLVITE) 623 MCG tablet Take 1 tablet (400 mcg total) by mouth daily. SEPARATE ALL SUPPLEMENTS TO LUNCH OR DINNER AND PRILOSEC NOT TO MESS W/THYROID MED 12/10/18  Yes McLean-Scocuzza, Nino Glow, MD  ibuprofen (ADVIL) 800 MG tablet Take 800 mg by mouth every 12 (twelve) hours as needed. 08/21/21  Yes [provider]  lidocaine (LIDODERM) 5 % Place 1-2 patches onto the skin daily as needed (pain). Apply patch to back at 8am, then remove at 8pm same day   Yes [provider]  Multiple Vitamin (MULTIVITAMIN WITH MINERALS) TABS tablet Take 1 tablet by mouth daily.   Yes [provider]  omeprazole (PRILOSEC) 40 MG capsule Take 1 capsule (40 mg total) by mouth daily. After lunch 06/23/20  Yes McLean-Scocuzza, Nino Glow, MD   pregabalin (LYRICA) 75 MG capsule Take 75 mg by mouth 3 (three) times daily.   Yes [provider]  vitamin B-12 (CYANOCOBALAMIN) 1000 MCG tablet Take 1 tablet (1,000 mcg total) by mouth daily. 10/09/18  Yes McLean-Scocuzza, Nino Glow, MD  acetaminophen (TYLENOL) 500 MG tablet Take 1,000 mg by mouth every 8 (eight) hours as needed for moderate pain.    [provider]  levothyroxine (SYNTHROID) 88 MCG tablet Take 1 tablet (88 mcg total) by mouth daily before breakfast. Skip sundays 12/02/19   McLean-Scocuzza, Nino Glow, MD  melatonin 5 MG TABS Take 1 tablet (5 mg total) by mouth at bedtime. 04/04/21   Val Riles, MD  montelukast (SINGULAIR) 10 MG tablet Take 1 tablet (10 mg  total) by mouth at bedtime. 02/14/20   McLean-Scocuzza, Nino Glow, MD  MYRBETRIQ 50 MG TB24 tablet TAKE ONE TABLET EVERY DAY 10/20/20   Vaillancourt, Aldona Bar, PA-C  polyethylene glycol powder (GLYCOLAX/MIRALAX) 17 GM/SCOOP powder Take 17 g by mouth daily as needed for moderate constipation. 10/21/19   McLean-Scocuzza, Nino Glow, MD  traZODone (DESYREL) 50 MG tablet TAKE 2 TABLETS EVERY DAY 1 HOUR BEFORE BED 09/11/20   McLean-Scocuzza, Nino Glow, MD    Inpatient Medications: Scheduled Meds:  aspirin EC  81 mg Oral QHS   atorvastatin  20 mg Oral Daily   carvedilol  6.25 mg Oral BID WC   cholecalciferol  5,000 Units Oral Daily   enoxaparin (LOVENOX) injection  40 mg Subcutaneous Q24H   escitalopram  5 mg Oral ZO-X0R   folic acid  0.5 mg Oral Daily   furosemide  40 mg Intravenous BID   levothyroxine  88 mcg Oral QAC breakfast   lisinopril  10 mg Oral Daily   loratadine  10 mg Oral Daily   melatonin  5 mg Oral QHS   mirabegron ER  50 mg Oral Daily   montelukast  10 mg Oral QHS   pantoprazole  40 mg Oral Daily   pregabalin  75 mg Oral TID   traZODone  50 mg Oral QHS   vitamin B-12  1,000 mcg Oral Daily   Continuous Infusions:  PRN Meds: acetaminophen **OR** acetaminophen, ALPRAZolam, lidocaine, ondansetron **OR**  ondansetron (ZOFRAN) IV, polyethylene glycol powder  Allergies:   No Known Allergies  Social History:   Social History   Socioeconomic History   Marital status: Widowed    Spouse name: Not on file   Number of children: Not on file   Years of education: Not on file   Highest education level: Not on file  Occupational History   Not on file  Tobacco Use   Smoking status: Passive Smoke Exposure - Never Smoker   Smokeless tobacco: Never   Tobacco comments:    husbands and children smoked in home.   Vaping Use   Vaping Use: Never used  Substance and Sexual Activity   Alcohol use: No   Drug use: No   Sexual activity: Never  Other Topics Concern   Not on file  Social History Narrative   Widowed    Lives [place]    Has kids and grandson    Social Determinants of Health   Financial Resource Strain: Not on file  Food Insecurity: Not on file  Transportation Needs: Not on file  Physical Activity: Not on file  Stress: Not on file  Social Connections: Not on file  Intimate Partner Violence: Not on file    Family History:    Family History  Problem Relation Age of Onset   Heart disease Mother    Hypertension Mother    Diabetes Mother    Heart attack Mother 10   Heart disease Father    Heart attack Father 62   Breast cancer Maternal Aunt    Heart attack Brother      ROS:  Please see the history of present illness.  Review of Systems  Constitutional: Negative.   HENT: Negative.    Respiratory:  Positive for shortness of breath.   Cardiovascular:  Positive for chest pain and leg swelling.  Gastrointestinal: Negative.   Musculoskeletal: Negative.   Neurological: Negative.   Psychiatric/Behavioral: Negative.    All other systems reviewed and are negative.   Physical Exam/Data:   Vitals:  08/26/21 0900 08/26/21 1034 08/26/21 1141 08/26/21 1544  BP: 128/90 100/72 103/82 107/78  Pulse: 84 80 69 79  Resp: _0 Temp:  97.8 F (36.6 C) 98 F (36.7 C)  98.1 F (36.7 C)  TempSrc:      SpO2: 97% 97% 97% 97%  Weight:      Height:        Intake/Output Summary (Last 24 hours) at 08/26/2021 1554 Last data filed at 08/26/2021 1424 Gross per 24 hour  Intake 480 ml  Output 1800 ml  Net -1320 ml    Last 3 Weights 08/25/2021 07/06/2021 04/01/2021  Weight (lbs) 158 lb 11.7 oz 157 lb 126 lb 1.7 oz  Weight (kg) 72 kg 71.215 kg 57.2 kg     Body mass index is 27.25 kg/m.  General:  Well nourished, well developed, in no acute distress HEENT: normal Neck:  JVD 10+ Vascular: No carotid bruits; Distal pulses 2+ bilaterally Cardiac:  normal S1, S2; RRR; no murmur  Lungs:  clear to auscultation bilaterally, no wheezing, rhonchi or rales  Abd: soft, nontender, no hepatomegaly  Ext: no edema Musculoskeletal:  No deformities, BUE and BLE strength normal and equal Skin: warm and dry  Neuro:  CNs 2-12 intact, no focal abnormalities noted Psych:  Normal affect   EKG:  The EKG was personally reviewed and demonstrates:   Normal sinus rhythm with secondary AV block type I ventricular rate 96 bpm  Telemetry:  Telemetry was personally reviewed and demonstrates:   Normal sinus rhythm with second-degree AV block type I  Relevant CV Studies: Echocardiogram   1. Left ventricular ejection fraction, by estimation, is 30 to 35%. The  left ventricle has moderately decreased function. The left ventricle  demonstrates global hypokinesis. The left ventricular internal cavity size  was mildly dilated. Left ventricular  diastolic parameters are consistent with Grade I diastolic dysfunction  (impaired relaxation).   2. Right ventricular systolic function is normal. The right ventricular  size is normal. Tricuspid regurgitation signal is inadequate for assessing  PA pressure.   3. Left atrial size was mild to moderately dilated.   4. Moderate pericardial effusion. The pericardial effusion is  circumferential. There is no evidence of cardiac tamponade. Moderate   pleural effusion.   5. The mitral valve is normal in structure. Moderate mitral valve  regurgitation. No evidence of mitral stenosis.   6. The aortic valve is normal in structure. Aortic valve regurgitation is  not visualized. Aortic valve sclerosis is present, with no evidence of  aortic valve stenosis.   Laboratory Data:  High Sensitivity Troponin:   Recent Labs  Lab 08/25/21 1913 08/25/21 2139  TROPONINIHS 7 7      Chemistry Recent Labs  Lab 08/25/21 1913 08/26/21 0448  NA 138 139  K 4.4 3.9  CL 108 108  CO2 23 25  GLUCOSE 94 89  BUN 25* 23  CREATININE 0.77 0.81  CALCIUM 8.4* 8.4*  GFRNONAA >60 >60  ANIONGAP 7 6     No results for input(s): PROT, ALBUMIN, AST, ALT, ALKPHOS, BILITOT in the last 168 hours. Lipids No results for input(s): CHOL, TRIG, HDL, LABVLDL, LDLCALC, CHOLHDL in the last 168 hours.  Hematology Recent Labs  Lab 08/25/21 1913 08/26/21 0448  WBC 3.9* 3.5*  RBC 3.73* 3.64*  HGB 11.9* 11.1*  HCT 35.7* 34.7*  MCV 95.7 95.3  MCH 31.9 30.5  MCHC 33.3 32.0  RDW 14.0 13.9  PLT 124* 119*    Thyroid  No results for input(s): TSH, FREET4 in the last 168 hours.  BNP Recent Labs  Lab 08/25/21 1913  BNP 353.9*     DDimer No results for input(s): DDIMER in the last 168 hours.   Radiology/Studies:  DG Chest 2 View  Result Date: 08/25/2021 CLINICAL DATA:  Dyspnea EXAM: CHEST - 2 VIEW COMPARISON:  03/31/2021 FINDINGS: Moderate bilateral pleural effusions are present with resultant mild retrocardiac opacification. No superimposed focal pulmonary infiltrate. No pneumothorax. Mild cardiomegaly is present, accentuated by seated positioning. Pulmonary vascularity is normal. No acute bone abnormality. Extensive thoracolumbar fusion instrumentation again noted. IMPRESSION: Moderate bilateral pleural effusions. Mild cardiomegaly. Electronically Signed   By: Fidela Salisbury M.D.   On: 08/25/2021 19:56   ECHOCARDIOGRAM COMPLETE  Result Date: 08/26/2021     ECHOCARDIOGRAM REPORT   Patient Name:   Kimberly Cook Date of Exam: 08/26/2021 Medical Rec #:  [id number]         Height:       64.0 in Accession #:    [id number]        Weight:       158.7 lb Date of Birth:  [birth year]         BSA:          1.773 m Patient Age:    53 years          BP:           100/88 mmHg Patient Gender: F                 HR:           78 bpm. Exam Location:  [facility] Procedure: 2D Echo, Cardiac Doppler and Color Doppler Indications:     CHF-Acute Diastolic E95.28  History:         Patient has prior history of Echocardiogram examinations.                  Stroke. Palps.  Sonographer:     Alyse Low Roar Referring Phys:  [id number] Loletha Grayer Diagnosing Phys: Ida Rogue MD IMPRESSIONS  1. Left ventricular ejection fraction, by estimation, is 30 to 35%. The left ventricle has moderately decreased function. The left ventricle demonstrates global hypokinesis. The left ventricular internal cavity size was mildly dilated. Left ventricular diastolic parameters are consistent with Grade I diastolic dysfunction (impaired relaxation).  2. Right ventricular systolic function is normal. The right ventricular size is normal. Tricuspid regurgitation signal is inadequate for assessing PA pressure.  3. Left atrial size was mild to moderately dilated.  4. Moderate pericardial effusion. The pericardial effusion is circumferential. There is no evidence of cardiac tamponade. Moderate pleural effusion.  5. The mitral valve is normal in structure. Moderate mitral valve regurgitation. No evidence of mitral stenosis.  6. The aortic valve is normal in structure. Aortic valve regurgitation is not visualized. Aortic valve sclerosis is present, with no evidence of aortic valve stenosis. FINDINGS  Left Ventricle: Left ventricular ejection fraction, by estimation, is 30 to 35%. The left ventricle has moderately decreased function. The left ventricle demonstrates global hypokinesis. The left ventricular internal cavity size  was mildly dilated. There is no left ventricular hypertrophy. Left ventricular diastolic parameters are consistent with Grade I diastolic dysfunction (impaired relaxation). Right Ventricle: The right ventricular size is normal. No increase in right ventricular wall thickness. Right ventricular systolic function is normal. Tricuspid regurgitation signal is inadequate for assessing PA pressure. Left Atrium: Left atrial size was mild to moderately dilated. Right Atrium: Right  atrial size was normal in size. Pericardium: A moderately sized pericardial effusion is present. The pericardial effusion is circumferential. There is diastolic collapse of the right atrial wall. There is no evidence of cardiac tamponade. Mitral Valve: The mitral valve is normal in structure. Moderate mitral valve regurgitation. No evidence of mitral valve stenosis. Tricuspid Valve: The tricuspid valve is normal in structure. Tricuspid valve regurgitation is mild . No evidence of tricuspid stenosis. Aortic Valve: The aortic valve is normal in structure. Aortic valve regurgitation is not visualized. Aortic valve sclerosis is present, with no evidence of aortic valve stenosis. Aortic valve peak gradient measures 9.9 mmHg. Pulmonic Valve: The pulmonic valve was normal in structure. Pulmonic valve regurgitation is not visualized. No evidence of pulmonic stenosis. Aorta: The aortic root is normal in size and structure. Venous: The inferior vena cava was not well visualized. IAS/Shunts: No atrial level shunt detected by color flow Doppler. Additional Comments: There is a moderate pleural effusion.  LEFT VENTRICLE PLAX 2D LVIDd:         5.10 cm      Diastology LVIDs:         4.40 cm      LV e' medial:    4.90 cm/s LV PW:         0.90 cm      LV E/e' medial:  19.3 LV IVS:        0.80 cm      LV e' lateral:   7.62 cm/s LVOT diam:     1.70 cm      LV E/e' lateral: 12.4 LVOT Area:     2.27 cm  LV Volumes (MOD) LV vol d, MOD A2C: 102.0 ml LV vol d, MOD A4C:  97.8 ml LV vol s, MOD A2C: 71.3 ml LV vol s, MOD A4C: 72.5 ml LV SV MOD A2C:     30.7 ml LV SV MOD A4C:     97.8 ml LV SV MOD BP:      28.7 ml RIGHT VENTRICLE RV Mid diam:    2.80 cm RV S prime:     10.00 cm/s TAPSE (M-mode): 2.0 cm LEFT ATRIUM             Index        RIGHT ATRIUM           Index LA diam:        4.60 cm 2.59 cm/m   RA Area:     15.90 cm LA Vol (A2C):   66.4 ml 37.44 ml/m  RA Volume:   39.10 ml  22.05 ml/m LA Vol (A4C):   67.9 ml 38.29 ml/m LA Biplane Vol: 72.8 ml 41.05 ml/m  AORTIC VALVE                 PULMONIC VALVE AV Area (Vmax): 1.07 cm     PV Vmax:          0.86 m/s AV Vmax:        157.00 cm/s  PV Peak grad:     2.9 mmHg AV Peak Grad:   9.9 mmHg     PR End Diast Vel: 2.57 msec LVOT Vmax:      74.10 cm/s   RVOT Peak grad:   1 mmHg  AORTA Ao Root diam: 2.70 cm Ao Asc diam:  2.80 cm MITRAL VALVE                TRICUSPID VALVE MV Area (PHT): 3.15 cm  TR Peak grad:   18.7 mmHg MV Decel Time: 241 msec     TR Vmax:        216.00 cm/s MV E velocity: 94.70 cm/s MV A velocity: 114.00 cm/s  SHUNTS MV E/A ratio:  0.83         Systemic Diam: 1.70 cm MV A Prime:    10.7 cm/s Ida Rogue MD Electronically signed by Ida Rogue MD Signature Date/Time: 08/26/2021/12:50:20 PM    Final      Assessment and Plan:   Acute systolic CHF -Presenting with chest pain, worsening shortness of breath Moderate bilateral pleural effusions Echocardiogram with severely depressed ejection fraction 30% global hypokinesis, dilated LV -Chest pain concerning for angina, cardiac enzymes negative -IV Lasix twice daily, close monitoring of renal function and electrolytes -We will check magnesium, previously low August 2022 Fluid restriction recommended, has high volume intake with ginger ale at nursing facility -Ischemic work-up as detailed below  2.  Moderate pericardial effusion No tamponade on echo No indication for intervention/pericardiocentesis at this time  3.  Cardiomyopathy Ejection  fraction 30%, global hypokinesis Will need several days of medical management, diuresis, guideline directed medical therapy before ischemic work-up/ Once appropriate candidate, euvolemic, would consider left heart and right heart catheterization -This was discussed with her, she is in agreement Still very short of breath -On aspirin, statin, ACE inhibitor, Lasix IV twice daily, Carvedilol  4.  Moderate bilateral pleural effusions Secondary to acute systolic CHF as detailed above Continue IV diuresis as above  5.  Mild pancytopenia Low WBC 3.5, hemoglobin 11, platelets 119 Outpatient work-up  6.  Second-degree AV block type I Asymptomatic, monitor for now    Total encounter time more than 110 minutes  Greater than 50% was spent in counseling and coordination of care with the patient   For questions or updates, please contact [facility] Please consult [email or web address] for contact info under    Signed, Ida Rogue, MD  08/26/2021 3:54 PM

## 2021-08-26 NOTE — Evaluation (Signed)
Clinical/Bedside Swallow Evaluation Patient Details  Name: Kimberly Cook MRN: [id number] Date of Birth: 01-Mar-1946  Today's Date: 08/26/2021 Time: SLP Start Time (ACUTE ONLY): 60 SLP Stop Time (ACUTE ONLY): 1330 SLP Time Calculation (min) (ACUTE ONLY): 20 min  Past Medical History:  Past Medical History:  Diagnosis Date   Allergy    Anxiety    Arthritis    Back pain    Chronic kidney disease    STAGE 3   Coronary artery disease 04/2017   Mild to moderate CAD in LAD/diagonal by CTA (CT-FFR of apical LAD 0.79).   DDD (degenerative disc disease), lumbar    Depression    Diverticulosis    Essential hypertension    Normal cardiolite 05/2006 EF 71%   GERD (gastroesophageal reflux disease)    Headache    History of shingles    Hyperlipidemia    Hypothyroidism    Lung nodule    Mini stroke 2011    Occipital neuralgia    Palpitations    Pneumonia 2018   Prediabetes    Stroke [facility])    Stroke ([place])    MRI 04/2008 + left sup. frontal gyrus possibly puntate infarct    Syncope 2019   Urinary tract infection    Vitamin D deficiency    Past Surgical History:  Past Surgical History:  Procedure Laterality Date   ABDOMINAL HYSTERECTOMY     BLADDER SURGERY     2003   BREAST EXCISIONAL BIOPSY Right Over 20 years    Benign   CHOLECYSTECTOMY     gastroplication      JOINT REPLACEMENT Left    KNEE   KNEE ARTHROSCOPY Left 2011   PULSE GENERATOR IMPLANT Left 01/31/2020   Procedure: PLACEMENT RIGHT FLANK PULSE GENERATOR VS REMOVAL SPINAL CORD STIMULATOR;  Surgeon: Deetta Perla, MD;  Location: [facility] ORS;  Service: Neurosurgery;  Laterality: Left;   PULSE GENERATOR IMPLANT Left 04/24/2020   Procedure: REPLACEMENT LEFT FLANK PULSE GENERATOR IMPLANT;  Surgeon: Deetta Perla, MD;  Location: [facility] ORS;  Service: Neurosurgery;  Laterality: Left;  MAC w/ local   right arm fracture     SPINAL CORD STIMULATOR REMOVAL N/A 06/26/2020   Procedure: SPINAL CORD STIMULATOR REMOVAL;  Surgeon: Deetta Perla, MD;  Location: [facility] ORS;  Service: Neurosurgery;  Laterality: N/A;   THORACIC LAMINECTOMY FOR SPINAL CORD STIMULATOR N/A 01/24/2020   Procedure: THORACIC SPINAL CORD STIMULATOR PADDLE TRIAL VIA LAMINECTOMY;  Surgeon: Deetta Perla, MD;  Location: [facility] ORS;  Service: Neurosurgery;  Laterality: N/A;   TONSILLECTOMY AND ADENOIDECTOMY     TOTAL KNEE ARTHROPLASTY Left 04/17/2015   Procedure: LEFT TOTAL KNEE ARTHROPLASTY;  Surgeon: Paralee Cancel, MD;  Location: [facility];  Service: Orthopedics;  Laterality: Left;   HPI:  Per Physician's H&P "Kimberly Cook is a 76 y.o. female with medical history significant for Chronic back pain s/p thoracic spinal fusion June 2022, HTN, hypothyroidism, anxiety and depression, recurrent chest pain with low risk stress this 09/2020, followed by cardiology and with no prior history of CHF who presents to the ED from [facility] with chest pain and shortness of breath.  She has increasing lower extremity edema but denies orthopnea.  She denies cough, fever or chills.  She has no nausea, vomiting, abdominal pain or diarrhea     ED course: On arrival BP 123/108-150 3/112 with otherwise normal vitals  Blood work: Troponin 7-7 and BNP 353  WBC 3900, hemoglobin 11.9 and platelets 124     EKG, personally  viewed and interpreted: Irregularly irregular rhythm, undetermined but without acute ST or T wave changes     Chest x-ray: Moderate bilateral pleural effusions and mild cardiomegaly     Patient given IV Lasix, Nitropaste and chewable aspirin.  Hospitalist consulted for admission.      Review of Systems: As per HPI otherwise all other systems on review of systems negative."    Assessment / Plan / Recommendation  Clinical Impression  Pt seen for clinical swallowing evaluation. Pt alert, pleasant, and cooperative. Consuming lunch upon SLP entrance to room. Denies s/sx oral or pharyngeal dysphagia.  Oral motor examination completed and unremarkable with exception of a mildly  hoarse, but functional, vocal quality. Pt does wear upper and lower dentures and currently is on 2L/min O2 via [place] (which pt required encouragement to wear).   Per chart review, temp WNL and WBC decreased. CXR 08/25/21 "Moderate bilateral pleural effusions. Mild cardiomegaly."  Pt observed with items from regular consistency lunch tray. Pt demonstrated an intact oral swallow. Pharyngeal swallow appeared [facility] per clinical assessment. No overt or subtle s/sx pharyngeal dysphagia. To palpation, pt with seemingly timely swallow initiation and seemingly adequate laryngeal elevation.  No change to vocal quality across trials.   Recommend continuation of a regular diet with thin liquids and safe swallowing strategies/aspiration precautions as outlined below.   SLP to sign off as pt has no acute SLP needs.   Pt and RN made aware of results of assessment, diet recommendations, and SLP POC. Pt verbalized understanding/agreement.  SLP Visit Diagnosis: Dysphagia, unspecified (R13.10)    Aspiration Risk  No limitations    Diet Recommendation Regular;Thin liquid   Medication Administration: Whole meds with liquid Supervision: Patient able to self feed Compensations:  (standard aspiration precautions)    Other  Recommendations Oral Care Recommendations: Oral care QID    Recommendations for follow up therapy are one component of a multi-disciplinary discharge planning process, led by the attending physician.  Recommendations may be updated based on patient status, additional functional criteria and insurance authorization.  Follow up Recommendations No SLP follow up      Assistance Recommended at Discharge  (anticipate need for some level of assistance at d/c)  Functional Status Assessment Patient has not had a recent decline in their functional status      Prognosis Prognosis for Safe Diet Advancement: Good      Swallow Study   General Date of Onset: 08/25/21 HPI: Per Physician's H&P "Kimberly Cook is a 76 y.o. female with medical history significant for Chronic back pain s/p thoracic spinal fusion June 2022, HTN, hypothyroidism, anxiety and depression, recurrent chest pain with low risk stress this 09/2020, followed by cardiology and with no prior history of CHF who presents to the ED from [facility] with chest pain and shortness of breath.  She has increasing lower extremity edema but denies orthopnea.  She denies cough, fever or chills.  She has no nausea, vomiting, abdominal pain or diarrhea     ED course: On arrival BP 123/108-150 3/112 with otherwise normal vitals  Blood work: Troponin 7-7 and BNP 353  WBC 3900, hemoglobin 11.9 and platelets 124     EKG, personally viewed and interpreted: Irregularly irregular rhythm, undetermined but without acute ST or T wave changes     Chest x-ray: Moderate bilateral pleural effusions and mild cardiomegaly     Patient given IV Lasix, Nitropaste and chewable aspirin.  Hospitalist consulted for admission.      Review  of Systems: As per HPI otherwise all other systems on review of systems negative." Type of Study: Bedside Swallow Evaluation Previous Swallow Assessment: unknown Diet Prior to this Study: Regular;Thin liquids Temperature Spikes Noted: No Respiratory Status: Nasal cannula (2L/min; encouragement to wear) History of Recent Intubation: No Behavior/Cognition: Alert;Cooperative;Pleasant mood Oral Cavity Assessment: Within Functional Limits Oral Care Completed by SLP: Yes Oral Cavity - Dentition: Dentures, top;Dentures, bottom Vision: Functional for self-feeding Self-Feeding Abilities: Able to feed self Patient Positioning: Upright in bed Baseline Vocal Quality:  (mildly hoarse; functional) Volitional Cough: Strong Volitional Swallow: Able to elicit    Oral/Motor/Sensory Function Overall Oral Motor/Sensory Function: Within functional limits   Thin Liquid Thin Liquid: Within functional limits Presentation: Cup;Self Fed;Straw     Puree Puree: Within functional limits Presentation: Self Fed;Spoon   Solid     Solid: Within functional limits Presentation: Self Fed     Cherrie Gauze, M.S., [facility] [phone] [place])   Quintella Baton 08/26/2021,2:06 PM

## 2021-08-26 NOTE — ED Provider Notes (Signed)
[facility] Provider Note    Event Date/Time   First MD Initiated Contact with Patient 08/26/21 0014     (approximate)   History   Chest Pain   HPI  Kimberly Cook is a 76 y.o. female brought to the ED via EMS from [facility] with a chief complaint of chest pain and shortness of breath.  Patient with a history of hypertension, GERD, CKD not on baseline oxygen endorses bilateral leg swelling x2 weeks.  Over the past 3 days she has experienced chest tightness and shortness of breath especially on ambulation.  Usually ambulates with a walker.  Denies fever, cough, abdominal pain, nausea, vomiting or dizziness.     Past Medical History   Past Medical History:  Diagnosis Date   Allergy    Anxiety    Arthritis    Back pain    Chronic kidney disease    STAGE 3   Coronary artery disease 04/2017   Mild to moderate CAD in LAD/diagonal by CTA (CT-FFR of apical LAD 0.79).   DDD (degenerative disc disease), lumbar    Depression    Diverticulosis    Essential hypertension    Normal cardiolite 05/2006 EF 71%   GERD (gastroesophageal reflux disease)    Headache    History of shingles    Hyperlipidemia    Hypothyroidism    Lung nodule    Mini stroke 2011    Occipital neuralgia    Palpitations    Pneumonia 2018   Prediabetes    Stroke [facility])    Stroke ([place])    MRI 04/2008 + left sup. frontal gyrus possibly puntate infarct    Syncope 2019   Urinary tract infection    Vitamin D deficiency      Active Problem List   Patient Active Problem List   Diagnosis Date Noted   Malnutrition of moderate degree 35/59/7416   Metabolic encephalopathy 38/45/3646   Failed spinal cord stimulator, subsequent encounter 08/08/2020   S/P insertion of spinal cord stimulator 04/25/2020   Lumbar radiculopathy 12/07/2019   Lumbar facet arthropathy 12/07/2019   Spinal stenosis, lumbar region, with neurogenic claudication 12/07/2019   Osteoporosis 11/24/2019    Lumbar herniated disc 06/09/2019   Lumbar degenerative disc disease 06/09/2019   Spinal stenosis of cervical region 05/07/2019   Lumbar spondylosis 05/07/2019   Recurrent UTI 05/07/2019   Abnormal MRI, lumbar spine 02/18/2019   Abnormal gait 02/18/2019   Chronic back pain 02/18/2019   Osteoarthritis 10/09/2018   Overactive bladder 10/09/2018   Chronic midline low back pain 04/22/2018   Chronic pain of left knee 04/22/2018   Toe fracture, left 03/10/2018   Lightheadedness 01/21/2018   PAC (premature atrial contraction) 01/19/2018   PVC's (premature ventricular contractions) 01/19/2018   Hernia, paraesophageal 12/16/2017   Kidney cysts 12/16/2017   CKD (chronic kidney disease) stage 3, GFR 30-59 ml/min (HCC) 12/16/2017   Carotid artery stenosis 12/16/2017   Chronic pain 12/16/2017   Osteopenia 12/16/2017   DDD (degenerative disc disease), cervical 12/16/2017   Labile hypertension 09/05/2017   Anxiety and depression 08/27/2017   Insomnia 08/27/2017   Syncope 08/27/2017   Bilateral occipital neuralgia 08/27/2017   Lung nodule 06/30/2017   Coronary artery disease of native artery of native heart with stable angina pectoris ([place]) 06/03/2017   SOB (shortness of breath) 04/11/2017   Labile blood pressure 03/08/2017   Syncope, near 03/08/2017   Palpitations 03/08/2017   Orthostatic lightheadedness 03/08/2017   Essential hypertension  Overweight (BMI 25.0-29.9) 06/17/2015   S/P TKR (total knee replacement) 04/17/2015   Morbid obesity ([place]) 09/21/2014   Medication management 09/21/2014   Hypothyroidism 02/17/2014   Mixed hyperlipidemia    GERD (gastroesophageal reflux disease)    Vitamin D deficiency    Depression      Past Surgical History   Past Surgical History:  Procedure Laterality Date   ABDOMINAL HYSTERECTOMY     BLADDER SURGERY     2003   BREAST EXCISIONAL BIOPSY Right Over 20 years    Benign   CHOLECYSTECTOMY     gastroplication      JOINT REPLACEMENT Left     KNEE   KNEE ARTHROSCOPY Left 2011   PULSE GENERATOR IMPLANT Left 01/31/2020   Procedure: PLACEMENT RIGHT FLANK PULSE GENERATOR VS REMOVAL SPINAL CORD STIMULATOR;  Surgeon: Deetta Perla, MD;  Location: [facility] ORS;  Service: Neurosurgery;  Laterality: Left;   PULSE GENERATOR IMPLANT Left 04/24/2020   Procedure: REPLACEMENT LEFT FLANK PULSE GENERATOR IMPLANT;  Surgeon: Deetta Perla, MD;  Location: [facility] ORS;  Service: Neurosurgery;  Laterality: Left;  MAC w/ local   right arm fracture     SPINAL CORD STIMULATOR REMOVAL N/A 06/26/2020   Procedure: SPINAL CORD STIMULATOR REMOVAL;  Surgeon: Deetta Perla, MD;  Location: [facility] ORS;  Service: Neurosurgery;  Laterality: N/A;   THORACIC LAMINECTOMY FOR SPINAL CORD STIMULATOR N/A 01/24/2020   Procedure: THORACIC SPINAL CORD STIMULATOR PADDLE TRIAL VIA LAMINECTOMY;  Surgeon: Deetta Perla, MD;  Location: [facility] ORS;  Service: Neurosurgery;  Laterality: N/A;   TONSILLECTOMY AND ADENOIDECTOMY     TOTAL KNEE ARTHROPLASTY Left 04/17/2015   Procedure: LEFT TOTAL KNEE ARTHROPLASTY;  Surgeon: Paralee Cancel, MD;  Location: [facility];  Service: Orthopedics;  Laterality: Left;     Home Medications   Prior to Admission medications   Medication Sig Start Date End Date Taking? Authorizing Provider  acetaminophen (TYLENOL) 500 MG tablet Take 1,000 mg by mouth every 8 (eight) hours as needed for moderate pain.    [provider]  alendronate (FOSAMAX) 70 MG tablet Take 70 mg by mouth once a week.  10/23/19   [provider]  ascorbic acid (VITAMIN C) 500 MG tablet Take 500 mg by mouth daily.    [provider]  aspirin EC 81 MG tablet Take 1 tablet (81 mg total) by mouth at bedtime. Swallow whole. 10/25/20   McLean-Scocuzza, Nino Glow, MD  atorvastatin (LIPITOR) 20 MG tablet Take 1 tablet (20 mg total) by mouth daily. 06/20/20   McLean-Scocuzza, Nino Glow, MD  Biotin 1 MG CAPS Take 1 mg by mouth daily.     [provider]  cetirizine (ZYRTEC) 10 MG  tablet Take 10 mg by mouth every morning.    [provider]  Cholecalciferol (VITAMIN D3) 125 MCG (5000 UT) TABS Take 1 tablet (5,000 Units total) by mouth daily. 10/09/18   McLean-Scocuzza, Nino Glow, MD  Cranberry 450 MG CAPS Take 1 capsule by mouth daily.    [provider]  diltiazem (CARDIZEM SR) 60 MG 12 hr capsule Take 1 capsule (60 mg total) by mouth 2 (two) times daily. 06/23/20   McLean-Scocuzza, Nino Glow, MD  escitalopram (LEXAPRO) 5 MG tablet Take 1 tablet (5 mg total) by mouth every morning. 06/23/20   McLean-Scocuzza, Nino Glow, MD  folic acid (FOLVITE) 973 MCG tablet Take 1 tablet (400 mcg total) by mouth daily. SEPARATE ALL SUPPLEMENTS TO LUNCH OR DINNER AND PRILOSEC NOT TO MESS W/THYROID MED 12/10/18  McLean-Scocuzza, Nino Glow, MD  levothyroxine (SYNTHROID) 88 MCG tablet Take 1 tablet (88 mcg total) by mouth daily before breakfast. Skip sundays 12/02/19   McLean-Scocuzza, Nino Glow, MD  lidocaine (LIDODERM) 5 % Place 1-2 patches onto the skin daily as needed (pain). Remove & Discard patch within 12 hours or as directed by MD    [provider]  melatonin 5 MG TABS Take 1 tablet (5 mg total) by mouth at bedtime. 04/04/21   Val Riles, MD  montelukast (SINGULAIR) 10 MG tablet Take 1 tablet (10 mg total) by mouth at bedtime. 02/14/20   McLean-Scocuzza, Nino Glow, MD  Multiple Vitamin (MULTIVITAMIN WITH MINERALS) TABS tablet Take 1 tablet by mouth daily.    [provider]  MYRBETRIQ 50 MG TB24 tablet TAKE ONE TABLET EVERY DAY 10/20/20   Debroah Loop, PA-C  omeprazole (PRILOSEC) 40 MG capsule Take 1 capsule (40 mg total) by mouth daily. After lunch 06/23/20   McLean-Scocuzza, Nino Glow, MD  polyethylene glycol powder (GLYCOLAX/MIRALAX) 17 GM/SCOOP powder Take 17 g by mouth daily as needed for moderate constipation. 10/21/19   McLean-Scocuzza, Nino Glow, MD  pregabalin (LYRICA) 75 MG capsule Take 75 mg by mouth 3 (three) times daily.    [provider]   traZODone (DESYREL) 50 MG tablet TAKE 2 TABLETS EVERY DAY 1 HOUR BEFORE BED 09/11/20   McLean-Scocuzza, Nino Glow, MD  vitamin B-12 (CYANOCOBALAMIN) 1000 MCG tablet Take 1 tablet (1,000 mcg total) by mouth daily. 10/09/18   McLean-Scocuzza, Nino Glow, MD     Allergies  Patient has no known allergies.   Family History   Family History  Problem Relation Age of Onset   Heart disease Mother    Hypertension Mother    Diabetes Mother    Heart attack Mother 43   Heart disease Father    Heart attack Father 56   Breast cancer Maternal Aunt    Heart attack Brother      Physical Exam  Triage Vital Signs: ED Triage Vitals  Enc Vitals Group     BP 08/25/21 1902 (!) 123/108     Pulse Rate 08/25/21 1902 83     Resp 08/25/21 1902 20     Temp 08/25/21 1902 98.5 F (36.9 C)     Temp Source 08/25/21 1902 Oral     SpO2 08/25/21 1902 94 %     Weight 08/25/21 1903 158 lb 11.7 oz (72 kg)     Height 08/25/21 1903 _0  (1.626 m)     Head Circumference --      Peak Flow --      Pain Score 08/25/21 1902 7     Pain Loc --      Pain Edu? --      Excl. in [place]? --     Updated Vital Signs: BP 112/78    Pulse 75    Temp 98.5 F (36.9 C) (Oral)    Resp 13    Ht _1  (1.626 m)    Wt 72 kg    SpO2 98%    BMI 27.25 kg/m    General: Awake, mild distress.  CV:  RRR.  Good peripheral perfusion.  Resp:  Increased effort.  Bibasilar rales. Abd:  Nontender to palpation.  No distention.  Other:  BLE 1+ nonpitting edema.   ED Results / Procedures / Treatments  Labs (all labs ordered are listed, but only abnormal results are displayed) Labs Reviewed  BASIC METABOLIC PANEL - Abnormal; Notable for  the following components:      Result Value   BUN 25 (*)    Calcium 8.4 (*)    All other components within normal limits  CBC - Abnormal; Notable for the following components:   WBC 3.9 (*)    RBC 3.73 (*)    Hemoglobin 11.9 (*)    HCT 35.7 (*)    Platelets 124 (*)    All other components within normal  limits  BRAIN NATRIURETIC PEPTIDE - Abnormal; Notable for the following components:   B Natriuretic Peptide 353.9 (*)    All other components within normal limits  RESP PANEL BY RT-PCR (FLU A&B, COVID) ARPGX2  TROPONIN I (HIGH SENSITIVITY)  TROPONIN I (HIGH SENSITIVITY)     EKG  ED ECG REPORT I, Mayda Shippee J, the attending physician, personally viewed and interpreted this ECG.   Date: 08/26/2021  EKG Time: 1909  Rate: 96  Rhythm: normal sinus rhythm  Axis: Normal  Intervals: Arrhythmia  ST&T Change: Nonspecific    RADIOLOGY I have personally reviewed patient's chest x-ray as well as the radiology interpretation:  Chest x-ray: Moderate bilateral pleural effusions, mild cardiomegaly  Official radiology report(s): DG Chest 2 View  Result Date: 08/25/2021 CLINICAL DATA:  Dyspnea EXAM: CHEST - 2 VIEW COMPARISON:  03/31/2021 FINDINGS: Moderate bilateral pleural effusions are present with resultant mild retrocardiac opacification. No superimposed focal pulmonary infiltrate. No pneumothorax. Mild cardiomegaly is present, accentuated by seated positioning. Pulmonary vascularity is normal. No acute bone abnormality. Extensive thoracolumbar fusion instrumentation again noted. IMPRESSION: Moderate bilateral pleural effusions. Mild cardiomegaly. Electronically Signed   By: Fidela Salisbury M.D.   On: 08/25/2021 19:56     PROCEDURES:  Critical Care performed: No  .1-3 Lead EKG Interpretation Performed by: Paulette Blanch, MD Authorized by: Paulette Blanch, MD     Interpretation: normal     ECG rate:  83   ECG rate assessment: normal     Rhythm: sinus rhythm     Ectopy: none     Conduction: normal   Comments:     Patient placed on cardiac monitor to evaluate for arrhythmia   MEDICATIONS ORDERED IN ED: Medications  aspirin chewable tablet 324 mg (324 mg Oral Given 08/26/21 0045)  nitroGLYCERIN (NITROGLYN) 2 % ointment 0.5 inch (0.5 inches Topical Given 08/26/21 0045)  furosemide  (LASIX) injection 20 mg (20 mg Intravenous Given 08/26/21 0046)     IMPRESSION / MDM / ASSESSMENT AND PLAN / ED COURSE  I reviewed the triage vital signs and the nursing notes.                             76 year old female presenting with chest pain and shortness of breath. Differential includes, but is not limited to, viral syndrome, bronchitis including COPD exacerbation, pneumonia, reactive airway disease including asthma, CHF including exacerbation with or without pulmonary/interstitial edema, pneumothorax, ACS, thoracic trauma, and pulmonary embolism.  I have personally reviewed patient's records especially her 07/06/2021 cardiology office visit.  The patient is on the cardiac monitor to evaluate for evidence of arrhythmia and/or significant heart rate changes.  Laboratory results notable for 2 sets of negative troponin, WBC 3.9, normal electrolytes.  Chest x-ray demonstrates bilateral pleural effusions.  Patient currently on 2 L nasal cannula oxygen.  Will attempt ambulation trial on room air.  Administer aspirin, nitroglycerin which should also help blood pressure, low-dose IV Lasix for diuresis.  Will reassess.  0218 Patient was  too weak to ambulate.  BNP mildly elevated.  Will consult hospitalist services to discuss case for admission.    FINAL CLINICAL IMPRESSION(S) / ED DIAGNOSES   Final diagnoses:  Nonspecific chest pain  SOB (shortness of breath)  Pleural effusion     Rx / DC Orders   ED Discharge Orders     None        Note:  This document was prepared using Dragon voice recognition software and may include unintentional dictation errors.   Paulette Blanch, MD 08/26/21 [id number]

## 2021-08-26 NOTE — Progress Notes (Signed)
*  PRELIMINARY RESULTS* Echocardiogram 2D Echocardiogram has been performed.  Kimberly Cook 08/26/2021, 12:25 PM

## 2021-08-26 NOTE — ED Notes (Signed)
Pt had large BM, pt cleaned and changed.

## 2021-08-27 ENCOUNTER — Inpatient Hospital Stay: Payer: Medicare HMO

## 2021-08-27 DIAGNOSIS — F32A Depression, unspecified: Secondary | ICD-10-CM

## 2021-08-27 DIAGNOSIS — E039 Hypothyroidism, unspecified: Secondary | ICD-10-CM

## 2021-08-27 LAB — BODY FLUID CELL COUNT WITH DIFFERENTIAL
Eos, Fluid: 0 %
Lymphs, Fluid: 67 %
Monocyte-Macrophage-Serous Fluid: 31 %
Neutrophil Count, Fluid: 2 %
Other Cells, Fluid: 0 %
Total Nucleated Cell Count, Fluid: 488 cu mm

## 2021-08-27 LAB — BASIC METABOLIC PANEL
Anion gap: 6 (ref 5–15)
BUN: 18 mg/dL (ref 8–23)
CO2: 28 mmol/L (ref 22–32)
Calcium: 8.3 mg/dL — ABNORMAL LOW (ref 8.9–10.3)
Chloride: 103 mmol/L (ref 98–111)
Creatinine, Ser: 0.75 mg/dL (ref 0.44–1.00)
GFR, Estimated: >60 mL/min (ref >60)
Glucose, Bld: 89 mg/dL (ref 70–99)
Potassium: 3.5 mmol/L (ref 3.5–5.1)
Sodium: 137 mmol/L (ref 135–145)

## 2021-08-27 LAB — HEPATITIS C ANTIBODY: HCV Ab: NONREACTIVE

## 2021-08-27 LAB — LACTATE DEHYDROGENASE, PLEURAL OR PERITONEAL FLUID: LD, Fluid: 66 U/L — ABNORMAL HIGH (ref 3–23)

## 2021-08-27 LAB — PROTEIN, PLEURAL OR PERITONEAL FLUID: Total protein, fluid: <3 g/dL

## 2021-08-27 LAB — MAGNESIUM: Magnesium: 1.7 mg/dL (ref 1.7–2.4)

## 2021-08-27 MED ORDER — SODIUM CHLORIDE 0.9% FLUSH: 3.0000 mL | INTRAVENOUS | Status: DC | PRN

## 2021-08-27 MED ORDER — LOSARTAN POTASSIUM 25 MG PO TABS
25.0000 mg | ORAL_TABLET | Freq: Every day | ORAL | Status: DC
  Administered 2021-08-28 – 2021-08-30 (×3): 25 mg via ORAL
  Filled 2021-08-27 (×3): qty 1

## 2021-08-27 MED ORDER — MAGNESIUM OXIDE -MG SUPPLEMENT 400 (240 MG) MG PO TABS
400.0000 mg | ORAL_TABLET | Freq: Every day | ORAL | Status: DC
  Administered 2021-08-27 – 2021-08-30 (×4): 400 mg via ORAL
  Filled 2021-08-27 (×4): qty 1

## 2021-08-27 MED ORDER — ASPIRIN 81 MG PO CHEW
81.0000 mg | CHEWABLE_TABLET | ORAL | Status: AC
  Administered 2021-08-28: 06:00:00 81 mg via ORAL
  Filled 2021-08-27: qty 1

## 2021-08-27 MED ORDER — POTASSIUM CHLORIDE CRYS ER 20 MEQ PO TBCR
20.0000 meq | EXTENDED_RELEASE_TABLET | Freq: Two times a day (BID) | ORAL | Status: DC
  Administered 2021-08-27 – 2021-08-30 (×6): 20 meq via ORAL
  Filled 2021-08-27 (×6): qty 1

## 2021-08-27 MED ORDER — SODIUM CHLORIDE 0.9 % IV SOLN: 250.0000 mL | INTRAVENOUS | Status: DC | PRN

## 2021-08-27 MED ORDER — SODIUM CHLORIDE 0.9 % IV SOLN: INTRAVENOUS | Status: DC

## 2021-08-27 MED ORDER — SODIUM CHLORIDE 0.9% FLUSH
3.0000 mL | Freq: Two times a day (BID) | INTRAVENOUS | Status: DC
  Administered 2021-08-27 – 2021-08-30 (×5): 3 mL via INTRAVENOUS

## 2021-08-27 MED ORDER — POLYETHYLENE GLYCOL 3350 17 G PO PACK: 17.0000 g | PACK | Freq: Every day | ORAL | Status: DC | PRN

## 2021-08-27 NOTE — Evaluation (Signed)
Occupational Therapy Evaluation Patient Details Name: Kimberly Cook MRN: [id number] DOB: [birth year] Today's Date: 08/27/2021   History of Present Illness Pt admitted for CHF. History includes CAD, HTN, and HLD. Pt complains of SOB symptoms.   Clinical Impression   Pt was seen for OT evaluation this date. Prior to hospital admission, pt was most recently at rehab where she required assist for seated bathing and LB dressing. Pt eager to go back to rehab to get stronger. Currently pt demonstrates impairments as described below (See OT problem list) which functionally limit her ability to perform ADL/self-care tasks. Pt currently requires MIN A for ADL transfers and MOD A for LB ADL. Pt educated in use of AE for LB dressing. Pt would benefit from skilled OT services to address noted impairments and functional limitations (see below for any additional details) in order to maximize safety and independence while minimizing falls risk and caregiver burden. Upon hospital discharge, recommend STR to maximize pt safety and return to PLOF.     Recommendations for follow up therapy are one component of a multi-disciplinary discharge planning process, led by the attending physician.  Recommendations may be updated based on patient status, additional functional criteria and insurance authorization.   Follow Up Recommendations  Skilled nursing-short term rehab (<3 hours/day)    Assistance Recommended at Discharge Intermittent Supervision/Assistance  Patient can return home with the following A little help with walking and/or transfers;A lot of help with bathing/dressing/bathroom;Assistance with cooking/housework;Assist for transportation;Help with stairs or ramp for entrance    Functional Status Assessment  Patient has had a recent decline in their functional status and demonstrates the ability to make significant improvements in function in a reasonable and predictable amount of time.  Equipment  Recommendations  Other (comment) Lexicographer)    Recommendations for Other Services       Precautions / Restrictions Precautions Precautions: Fall Restrictions Weight Bearing Restrictions: No      Mobility Bed Mobility               General bed mobility comments: NT, up in recliner at start and end of session    Transfers Overall transfer level: Needs assistance Equipment used: Rolling walker (2 wheels) Transfers: Sit to/from Stand Sit to Stand: Min assist                  Balance Overall balance assessment: Needs assistance   Sitting balance-Leahy Scale: Good     Standing balance support: Bilateral upper extremity supported Standing balance-Leahy Scale: Fair                             ADL either performed or assessed with clinical judgement   ADL Overall ADL's : Needs assistance/impaired                                       General ADL Comments: Pt currently requires MOD A for LB ADL and MIN A for ADL transfers     Vision         Perception     Praxis      Pertinent Vitals/Pain Pain Assessment Pain Assessment: No/denies pain     Hand Dominance     Extremity/Trunk Assessment Upper Extremity Assessment Upper Extremity Assessment: Generalized weakness   Lower Extremity Assessment Lower Extremity Assessment: Generalized weakness (L<R)       Communication  Communication Communication: No difficulties   Cognition Arousal/Alertness: Awake/alert Behavior During Therapy: WFL for tasks assessed/performed Overall Cognitive Status: Within Functional Limits for tasks assessed                                       General Comments       Exercises Other Exercises Other Exercises: Pt educated in AE for LB ADL to improve her independence; pt interested in practicing during future session   Shoulder Instructions      Home Living Family/patient expects to be discharged to:: Skilled nursing  facility                                 Additional Comments: Pt currently at [facility], recently dc'd from therapy services. Per son, plans to transition to LTC at [facility] whenever SNF benefits end      Prior Functioning/Environment Prior Level of Function : Needs assist       Physical Assist : Mobility (physical)     Mobility Comments: was primarily using her WC to self propel using B UE/LE. Also using rollator for short distances. ADLs Comments: was requiring assist for showering from staff 2x/wk and assist for LB dressing; was independent with meds mgt per pt report        OT Problem List: Decreased strength;Decreased activity tolerance;Decreased knowledge of use of DME or AE;Impaired balance (sitting and/or standing)      OT Treatment/Interventions: Self-care/ADL training;Therapeutic exercise;Therapeutic activities;DME and/or AE instruction;Patient/family education;Balance training    OT Goals(Current goals can be found in the care plan section) Acute Rehab OT Goals Patient Stated Goal: go to rehab to get stronger OT Goal Formulation: With patient Time For Goal Achievement: 09/10/21 Potential to Achieve Goals: Good  OT Frequency: Min 2X/week    Co-evaluation              AM-PAC OT "6 Clicks" Daily Activity     Outcome Measure Help from another person eating meals?: None Help from another person taking care of personal grooming?: None Help from another person toileting, which includes using toliet, bedpan, or urinal?: A Little Help from another person bathing (including washing, rinsing, drying)?: A Lot Help from another person to put on and taking off regular upper body clothing?: None Help from another person to put on and taking off regular lower body clothing?: A Lot 6 Click Score: 19   End of Session Nurse Communication: Other (comment) (let unit secretary know about chair alarm wall unit not connected to wall; chair alarm  on)  Activity Tolerance: Patient tolerated treatment well Patient left: in chair;with call bell/phone within reach;with chair alarm set;with family/visitor present  OT Visit Diagnosis: Other abnormalities of gait and mobility (R26.89);Muscle weakness (generalized) (M62.81)                Time: 2993-7169 OT Time Calculation (min): 16 min Charges:  OT General Charges $OT Visit: 1 Visit OT Evaluation $OT Eval Moderate Complexity: 1 Mod  Arman Filter., MPH, MS, OTR/L ascom [phone] 08/27/21, 5:02 PM

## 2021-08-27 NOTE — Progress Notes (Signed)
°  Progress Note   Date: 08/27/2021  Patient Name: Kimberly Cook        MRN#: [id number]  Clarification of diagnosis:  GFR greater than 60.  Unable to clarify if chronic kidney disease.

## 2021-08-27 NOTE — Consult Note (Addendum)
° °  Heart Failure Nurse Navigator Note  HFrEF 30 to 35%.  Grade 1 diastolic dysfunction.  Moderate pericardial effusion.  Ejection fraction had previously been reported at 50 to 55%.  She reported to the emergency room with complaints of chest pain, worsening shortness of breath and lower extremity edema.  Comorbidities:  Anxiety/depression Chronic kidney disease Coronary artery disease Hyperlipidemia Strokes  Medications:  Aspirin 81 mg daily Lipitor 20 mg daily Carvedilol 6.25 mg 2 times a day with meals Folate acid 0.5 mg daily Furosemide 40 mg IV 2 times a day  Losartan 25 mg listed as future medication   Labs:  Sodium 137, potassium 3.5, chloride 103, CO2 28, BUN 89, creatinine 1.8, magnesium 1.7.  BNP on admission 350. Weight is 77.2 kg Blood pressure 103/65 Intake 480 mL Output 3350 Thoracentesis 500 mL   Initial meeting with patient today.  She states that her breathing has improved since the thoracentesis but still is not back to 100%.  She states since her back surgery she has lived at [facility] rehab facility.  She is hoping to get back to independent living.  She states that she had noted changes in her breathing and in her voice for the last 2 to 3 weeks.  Discussed heart failure and showed examples.  She is questioning why this happened to her.  Explained the different causes, and told her that the doctors will  know more after they do her heart catheterization.  Discussed low sodium and not using salt at the table.  Also talked about fluid restriction.  She states that she drinks 5 small cans of ginger ale daily.  She denied any other fluid intake.  Discussed follow-up in the outpatient heart failure clinic.  She has an appointment for February 1 at 1:30 in the afternoon.  She has a 5% ratio of no-show which is 6 out of 128 appointments.  She was given the living with heart failure teaching booklet, information on low-sodium and zone magnet.  Pricilla Riffle RN CHFN

## 2021-08-27 NOTE — Procedures (Signed)
PROCEDURE SUMMARY:  Successful US guided left thoracentesis. Yielded 500 mL of clear yellow fluid. Pt tolerated procedure well. No immediate complications.  Specimen was sent for labs. CXR ordered.  EBL < 1 mL  Cloretta Ned 08/27/2021 12:08 PM

## 2021-08-27 NOTE — Progress Notes (Signed)
Progress Note  Patient Name: Kimberly Cook Date of Encounter: 08/27/2021  Primary Cardiologist: Nelva Bush, MD  Subjective   S/p L thoracentesis this AM.  Breathing has been improving.  Good response to IV lasix.  No chest pain.  Inpatient Medications    Scheduled Meds:  aspirin EC  81 mg Oral QHS   atorvastatin  20 mg Oral Daily   carvedilol  6.25 mg Oral BID WC   cholecalciferol  5,000 Units Oral Daily   enoxaparin (LOVENOX) injection  40 mg Subcutaneous Q24H   escitalopram  5 mg Oral [id number]   folic acid  0.5 mg Oral Daily   furosemide  40 mg Intravenous BID   levothyroxine  88 mcg Oral QAC breakfast   lisinopril  10 mg Oral Daily   loratadine  10 mg Oral Daily   melatonin  5 mg Oral QHS   mirabegron ER  50 mg Oral Daily   montelukast  10 mg Oral QHS   pantoprazole  40 mg Oral Daily   pregabalin  75 mg Oral TID   traZODone  50 mg Oral QHS   vitamin B-12  1,000 mcg Oral Daily   Continuous Infusions:  PRN Meds: acetaminophen **OR** acetaminophen, ALPRAZolam, lidocaine, ondansetron **OR** ondansetron (ZOFRAN) IV, polyethylene glycol   Vital Signs    Vitals:   08/27/21 0500 08/27/21 0731 08/27/21 1122 08/27/21 1152  BP:  101/82 106/72 103/65  Pulse:  71 61 80  Resp:  18    Temp:  98.4 F (36.9 C)    TempSrc:      SpO2:  95% 100% 98%  Weight: 77.2 kg     Height:        Intake/Output Summary (Last 24 hours) at 08/27/2021 1153 Last data filed at 08/27/2021 0900 Gross per 24 hour  Intake 720 ml  Output 3150 ml  Net -2430 ml   Filed Weights   08/25/21 1903 08/27/21 0500  Weight: 72 kg 77.2 kg    Physical Exam   GEN: Well nourished, well developed, in no acute distress.  HEENT: Grossly normal.  Neck: Supple, mildly elevated JVD, no carotid bruits, or masses. Cardiac: RRR, no murmurs, rubs, or gallops. No clubbing, cyanosis, trace bilat LE edema.  Radials 2+, DP/PT 2+ and equal bilaterally.  Respiratory:  Respirations regular and unlabored,  diminished breath sounds @ R base.  CTA on L. GI: Soft, nontender, nondistended, BS + x 4. MS: no deformity or atrophy. Skin: warm and dry, no rash. Neuro:  Strength and sensation are intact. Psych: AAOx3.  Normal affect.  Labs    Chemistry Recent Labs  Lab 08/25/21 1913 08/26/21 0448 08/27/21 0606  NA 138 139 137  K 4.4 3.9 3.5  CL 108 108 103  CO2 23 25 28   GLUCOSE 94 89 89  BUN 25* 23 18  CREATININE 0.77 0.81 0.75  CALCIUM 8.4* 8.4* 8.3*  GFRNONAA >60 >60 >60  ANIONGAP 7 6 6      Hematology Recent Labs  Lab 08/25/21 1913 08/26/21 0448  WBC 3.9* 3.5*  RBC 3.73* 3.64*  HGB 11.9* 11.1*  HCT 35.7* 34.7*  MCV 95.7 95.3  MCH 31.9 30.5  MCHC 33.3 32.0  RDW 14.0 13.9  PLT 124* 119*    Cardiac Enzymes  Recent Labs  Lab 08/25/21 1913 08/25/21 2139  TROPONINIHS 7 7      BNP Recent Labs  Lab 08/25/21 1913  BNP 353.9*   Lipids  Lab Results  Component Value Date  CHOL 125 11/24/2019   HDL 46.40 11/24/2019   LDLCALC 47 11/24/2019   TRIG 158.0 (H) 11/24/2019   CHOLHDL 3 11/24/2019    HbA1c  Lab Results  Component Value Date   HGBA1C 5.1 11/24/2019    Radiology    DG Chest 2 View  Result Date: 08/27/2021 CLINICAL DATA:  76 year old female with a history of CHF EXAM: CHEST - 2 VIEW COMPARISON:  08/25/2021 FINDINGS: Cardiomediastinal silhouette unchanged with cardiomegaly. Low lung volumes persist with mixed vague interstitial and airspace opacities at the lung bases. Mild interlobular septal thickening with blunting of the bilateral costophrenic angles, and meniscus on the lateral view in the costophrenic sulcus. Thickening of the fissures. No pneumothorax. No new confluent airspace disease. Surgical changes of the thoracolumbar region. IMPRESSION: Acute CHF, with bilateral pleural effusions. Overall appears slightly increased from the prior. Electronically Signed   By: Corrie Mckusick D.O.   On: 08/27/2021 08:15   DG Chest 2 View  Result Date:  08/25/2021 CLINICAL DATA:  Dyspnea EXAM: CHEST - 2 VIEW COMPARISON:  03/31/2021 FINDINGS: Moderate bilateral pleural effusions are present with resultant mild retrocardiac opacification. No superimposed focal pulmonary infiltrate. No pneumothorax. Mild cardiomegaly is present, accentuated by seated positioning. Pulmonary vascularity is normal. No acute bone abnormality. Extensive thoracolumbar fusion instrumentation again noted. IMPRESSION: Moderate bilateral pleural effusions. Mild cardiomegaly. Electronically Signed   By: Fidela Salisbury M.D.   On: 08/25/2021 19:56   Telemetry    RSR w/ freq PACs,  - Personally Reviewed  Cardiac Studies   Coronary CTA 9.2018  Left main is a large artery that gives rise to LAD, ramus intermedius and LCX arteries. LAD is a large vessel that gives rise to two diagonal branches. Proximal LAD had mild diffuse calcified plaque with maximum stenosis 25-50%. D1 is small and has no significant plaque. D2 has moderate calcified ostial plaque associated with 50-69% stenosis. RI is a very small artery that has no plaque. LCX is a non-dominant artery that gives rise to one OM1 branch. There is no plaque. RCA is a large dominant artery that gives rise to PDA and PLVB.  There is no plaque.   Other findings:   Normal pulmonary vein drainage into the left atrium. Normal let atrial appendage without a thrombus. Normal size of the pulmonary artery.   IMPRESSION: 1. Coronary calcium score of 257. This was 22 percentile for age and sex matched control. 2. Normal coronary origin with right dominance. 3. Mild non-obstructive CAD in the proximal LAD and moderate CAD is in ostium of a small second diagonal branch. Aggressive risk factor modification is recommended. _____________   2D Echocardiogram 1.22.2023   1. Left ventricular ejection fraction, by estimation, is 30 to 35%. The  left ventricle has moderately decreased function. The left ventricle  demonstrates  global hypokinesis. The left ventricular internal cavity size  was mildly dilated. Left ventricular  diastolic parameters are consistent with Grade I diastolic dysfunction  (impaired relaxation).   2. Right ventricular systolic function is normal. The right ventricular  size is normal. Tricuspid regurgitation signal is inadequate for assessing  PA pressure.   3. Left atrial size was mild to moderately dilated.   4. Moderate pericardial effusion. The pericardial effusion is  circumferential. There is no evidence of cardiac tamponade. Moderate  pleural effusion.   5. The mitral valve is normal in structure. Moderate mitral valve  regurgitation. No evidence of mitral stenosis.   6. The aortic valve is normal in structure. Aortic valve  regurgitation is  not visualized. Aortic valve sclerosis is present, with no evidence of  aortic valve stenosis.  _____________   Patient Profile     76 y.o. female w/ a h/o moderate nonobstructive CAD by coronary CTA in 04/2017, HTN, HL, and "mini-strokes," who was admitted 1/21 w/ acute CHF and found to have LV dysfunction (EF 30-35%).  Assessment & Plan    1.  Acute systolic CHF/Cardiomyopathy:  Admitted 1/21 w/ progressive dyspnea/edema and found to have EF of 30-35% on echo.  She has responded well to IV lasix and was minus 2.8L overnight.  Wts inaccurate.  Still w/ mild volume overload on exam.  She was 157 lbs when seen in cardiology clinic on 07/06/2021, and felt to be euvolemic @ that time.  Cont IV diuresis.  Cont ? blocker.  Change lisinopril to losartan so that we may transition to entresto if BP allows.  I suspect that she may be ready for cath tomorrow.  Will keep NPO after midnight.  2.  Essential HTN:  BPs stable/soft.  Cont ? blocker.  As above, change lisinopril to losartan in order to begin process of transitioning to entresto (if BP allows).  3.  HL:  LDL 47 in 11/2019.  Needs f/u.  Cont statin rx.  4.  Mobitz I:  I do not appreciate  recurrent Mobitz I on tele overnight.  Freq PACs and atrial runs noted.  Cont ? blocker.  5.  PSVT/PAT:  freq PACs and atrial runs noted on tele.  Cont ? blocker.  No sustained SVT. Was prev on dilt @ home  d/c'd in setting of LV dysfxn.  ? Potential role for frequent atrial arrhythmias in LV dysfxn (tach-induced?).  6.  Pancytopenia: Stable.  7.  Moderate bilateral pleural effusions: CXR this AM w/ sl worsening of edema/effusions.  S/p L thoracentesis this AM w/ 500 ml removed.  Follow.  Signed, Murray Hodgkins, NP  08/27/2021, 11:53 AM    For questions or updates, please contact   Please consult [email or web address] for contact info under Cardiology/STEMI.

## 2021-08-27 NOTE — Evaluation (Signed)
Physical Therapy Evaluation Patient Details Name: Kimberly Cook MRN: [id number] DOB: 1945-08-21 Today's Date: 08/27/2021  History of Present Illness  Pt admitted for CHF. History includes CAD, HTN, and HLD. Pt complains of SOB symptoms.  Clinical Impression  Pt is a pleasant 76 year old female who was admitted for CHF. Pt performs bed mobility/transfers with min assist and ambulation with cga and RW. Pt demonstrates deficits with strength/mobility. All mobility performed on RA with sats WNL. Would benefit from skilled PT to address above deficits and promote optimal return to PLOF; recommend transition to STR upon discharge from acute hospitalization.      Recommendations for follow up therapy are one component of a multi-disciplinary discharge planning process, led by the attending physician.  Recommendations may be updated based on patient status, additional functional criteria and insurance authorization.  Follow Up Recommendations Skilled nursing-short term rehab (<3 hours/day)    Assistance Recommended at Discharge Intermittent Supervision/Assistance  Patient can return home with the following  A little help with walking and/or transfers;A little help with bathing/dressing/bathroom    Equipment Recommendations None recommended by PT  Recommendations for Other Services       Functional Status Assessment Patient has had a recent decline in their functional status and demonstrates the ability to make significant improvements in function in a reasonable and predictable amount of time.     Precautions / Restrictions Precautions Precautions: Fall Restrictions Weight Bearing Restrictions: No      Mobility  Bed Mobility Overal bed mobility: Needs Assistance Bed Mobility: Supine to Sit     Supine to sit: Min assist     General bed mobility comments: able to initially assist with trunkal elevation, however needs assist for scooting hips out towards EOB.     Transfers Overall transfer level: Needs assistance Equipment used: Rolling walker (2 wheels) Transfers: Sit to/from Stand Sit to Stand: Min assist           General transfer comment: takes several attempts for successful standing. Once standing, upright posture noted. All mobility performed on RA with sats at 94%.    Ambulation/Gait Ambulation/Gait assistance: Min guard Gait Distance (Feet): 40 Feet Assistive device: Rolling walker (2 wheels) Gait Pattern/deviations: Step-to pattern       General Gait Details: ambulation performed in room with step to gait pattern and safe technique. Does fatigue with short distance. O2 sats at 93% with exertion.  Stairs            Wheelchair Mobility    Modified Rankin (Stroke Patients Only)       Balance Overall balance assessment: Needs assistance Sitting-balance support: Feet supported Sitting balance-Leahy Scale: Good     Standing balance support: Bilateral upper extremity supported Standing balance-Leahy Scale: Fair                               Pertinent Vitals/Pain Pain Assessment Pain Assessment: No/denies pain    Home Living Family/patient expects to be discharged to:: Skilled nursing facility                   Additional Comments: Pt currently at [facility], recently dc'd from therapy services. Per son, plans to transition to LTC at [facility] whenever SNF benefits end    Prior Function Prior Level of Function : Needs assist       Physical Assist : Mobility (physical)     Mobility Comments: was primarily  using her WC to self propel using B UE/LE. Also using rollator for short distances.       Hand Dominance        Extremity/Trunk Assessment   Upper Extremity Assessment Upper Extremity Assessment: Generalized weakness    Lower Extremity Assessment Lower Extremity Assessment: Generalized weakness (L < R)       Communication   Communication: No difficulties   Cognition Arousal/Alertness: Awake/alert Behavior During Therapy: WFL for tasks assessed/performed Overall Cognitive Status: Within Functional Limits for tasks assessed                                          General Comments      Exercises Other Exercises Other Exercises: pt ambulated to bathroom and able to perform hygiene and self care with supervision. Safe technique performed   Assessment/Plan    PT Assessment Patient needs continued PT services  PT Problem List Decreased strength;Decreased activity tolerance;Decreased balance;Decreased mobility       PT Treatment Interventions Gait training;Therapeutic exercise;Balance training    PT Goals (Current goals can be found in the Care Plan section)  Acute Rehab PT Goals Patient Stated Goal: to go back to SNF PT Goal Formulation: With patient Time For Goal Achievement: 09/10/21 Potential to Achieve Goals: Good    Frequency Min 2X/week     Co-evaluation               AM-PAC PT "6 Clicks" Mobility  Outcome Measure Help needed turning from your back to your side while in a flat bed without using bedrails?: A Little Help needed moving from lying on your back to sitting on the side of a flat bed without using bedrails?: A Little Help needed moving to and from a bed to a chair (including a wheelchair)?: A Little Help needed standing up from a chair using your arms (e.g., wheelchair or bedside chair)?: A Little Help needed to walk in hospital room?: A Little Help needed climbing 3-5 steps with a railing? : A Little 6 Click Score: 18    End of Session Equipment Utilized During Treatment: Gait belt Activity Tolerance: Patient tolerated treatment well Patient left: in chair (left with OT) Nurse Communication: Mobility status PT Visit Diagnosis: Unsteadiness on feet (R26.81);Muscle weakness (generalized) (M62.81);Difficulty in walking, not elsewhere classified (R26.2)    Time: [id number] PT Time  Calculation (min) (ACUTE ONLY): 26 min   Charges:   PT Evaluation $PT Eval Low Complexity: 1 Low PT Treatments $Therapeutic Activity: 8-22 mins        Greggory Stallion, PT, DPT, GCS [phone]   Malikah Principato 08/27/2021, 4:46 PM

## 2021-08-27 NOTE — Progress Notes (Addendum)
Patient ID: Kimberly Cook, female   DOB: [birth year], 76 y.o.   MRN: [id number] [facility] PROGRESS NOTE  Kimberly Cook [name]:[id number] DOB: [birth year] DOA: 08/25/2021 PCP: Melonie [place], FNP  HPI/Subjective: Patient still with some shortness of breath.  Admitted with acute systolic congestive heart failure bilateral pleural effusions and pericardial effusion.  Objective: Vitals:   08/27/21 1122 08/27/21 1152  BP: 106/72 103/65  Pulse: 61 80  Resp:    Temp:    SpO2: 100% 98%    Intake/Output Summary (Last 24 hours) at 08/27/2021 1433 Last data filed at 08/27/2021 1300 Gross per 24 hour  Intake 480 ml  Output 2200 ml  Net -1720 ml   Filed Weights   08/25/21 1903 08/27/21 0500  Weight: 72 kg 77.2 kg    ROS: Review of Systems  Respiratory:  Positive for shortness of breath. Negative for cough.   Cardiovascular:  Negative for chest pain.  Gastrointestinal:  Negative for abdominal pain, nausea and vomiting.  Exam: Physical Exam HENT:     Head: Normocephalic.     Mouth/Throat:     Pharynx: No oropharyngeal exudate.  Eyes:     General: Lids are normal.     Conjunctiva/sclera: Conjunctivae normal.  Cardiovascular:     Rate and Rhythm: Normal rate and regular rhythm.     Heart sounds: Normal heart sounds, S1 normal and S2 normal.  Pulmonary:     Breath sounds: Examination of the right-lower field reveals decreased breath sounds and rales. Examination of the left-lower field reveals decreased breath sounds and rales. Decreased breath sounds and rales present. No wheezing or rhonchi.  Abdominal:     Palpations: Abdomen is soft.     Tenderness: There is no abdominal tenderness.  Musculoskeletal:     Right lower leg: Swelling present.     Left lower leg: Swelling present.  Skin:    General: Skin is warm.     Findings: No rash.  Neurological:     Mental Status: She is alert and oriented to person, place, and time.      Scheduled Meds:  aspirin EC  81 mg  Oral QHS   atorvastatin  20 mg Oral Daily   carvedilol  6.25 mg Oral BID WC   cholecalciferol  5,000 Units Oral Daily   enoxaparin (LOVENOX) injection  40 mg Subcutaneous Q24H   escitalopram  5 mg Oral BH-q7a   folic acid  0.5 mg Oral Daily   furosemide  40 mg Intravenous BID   levothyroxine  88 mcg Oral QAC breakfast   loratadine  10 mg Oral Daily   [START ON 08/28/2021] losartan  25 mg Oral Daily   melatonin  5 mg Oral QHS   mirabegron ER  50 mg Oral Daily   montelukast  10 mg Oral QHS   pantoprazole  40 mg Oral Daily   pregabalin  75 mg Oral TID   traZODone  50 mg Oral QHS   vitamin B-12  1,000 mcg Oral Daily     Assessment/Plan:  Acute on chronic systolic congestive heart failure, pericardial effusion.  EF 30 to 35%.  Continue increased dose of Lasix 40 mg IV twice daily.  Continue lisinopril and metoprolol.  Try and taper off oxygen. Bilateral pleural effusions.  Since patient was still having shortness of breath today I ordered for thoracentesis.  500 mL of fluid taken off underneath left lung today by interventional radiology. Essential hypertension.  Initial blood pressures were high when she was having  a lot of difficulty breathing.  Now blood pressure is improved on lisinopril metoprolol and Lasix. Pancytopenia.  Hepatitis C negative. Weakness.  The patient has been in rehab since her back surgery in June.  PT and OT consultations Hypothyroidism unspecified on levothyroxine Neuropathy on gabapentin Hyperlipidemia unspecified on atorvastatin Depression on Lexapro Previous completed stroke on aspirin and Lipitor DNR     Code Status:     Code Status Orders  (From admission, onward)           Start     Ordered   08/26/21 1139  Do not attempt resuscitation (DNR)  Continuous       Question Answer Comment  In the event of cardiac or respiratory ARREST Do not call a code blue   In the event of cardiac or respiratory ARREST Do not perform Intubation, CPR,  defibrillation or ACLS   In the event of cardiac or respiratory ARREST Use medication by any route, position, wound care, and other measures to relive pain and suffering. May use oxygen, suction and manual treatment of airway obstruction as needed for comfort.   Comments nurse may pronounce      08/26/21 1138           Code Status History     Date Active Date Inactive Code Status Order ID Comments User Context   08/26/2021 0246 08/26/2021 1138 Full Code [id number]  Andris Baumann, MD ED   04/01/2021 0224 04/04/2021 2200 DNR [id number]  Arville Care, Vernetta Honey, MD Inpatient   03/31/2021 2143 04/01/2021 0224 Full Code [id number]  Mansy, Vernetta Honey, MD ED   01/24/2020 1738 01/25/2020 1907 Full Code [id number]  Patsey Berthold, NP Inpatient   12/13/2015 2059 12/14/2015 2049 Full Code [id number]  Rhetta Mura, MD Inpatient   04/17/2015 1712 04/19/2015 1446 Full Code [id number]  Shelly Coss Inpatient      Advance Directive Documentation    Flowsheet Row Most Recent Value  Type of Advance Directive Out of facility DNR (pink MOST or yellow form)  Pre-existing out of facility DNR order (yellow form or pink MOST form) --  "MOST" Form in Place? --      Family Communication: Spoke with patient's son on the phone Disposition Plan: Status is: Inpatient  Patient still short of breath today.  Thoracentesis done today and 500 mL removed underneath the left lung.  Continue IV Lasix for diuresis.  Machai Desmith [employer]  [facility] [employer]

## 2021-08-27 NOTE — Progress Notes (Signed)
Pt family would like to be notified of the time Heart Cath procedure will take place.  Pt family requesting a copy of DNR and FL2, will provide family with social worker information

## 2021-08-27 NOTE — TOC Initial Note (Addendum)
Transition of Care [facility]) - Initial/Assessment Note    Patient Details  Name: Kimberly Cook MRN: [id number] Date of Birth: 13-Apr-1946  Transition of Care [facility]) CM/SW Contact:    Alberteen Sam, LCSW Phone Number: 08/27/2021, 3:07 PM  Clinical Narrative:                  CSW met with patient at bedside. She confirms she is from [facility] and plans to return there when medically ready to dc.   CSW has reached out to Neoma Laming at [facility] confirms that they will need insurance auth at time of discharge if she is eligible for therapy.   CSW notes PT/OT evals in, pending evals at this time.   Expected Discharge Plan: Skilled Nursing Facility Barriers to Discharge: Continued Medical Work up   Patient Goals and CMS Choice Patient states their goals for this hospitalization and ongoing recovery are:: to go home CMS Medicare.gov Compare Post Acute Care list provided to:: Patient Choice offered to / list presented to : Patient  Expected Discharge Plan and Services Expected Discharge Plan: [place]       Living arrangements for the past 2 months: [place] [facility])                                      Prior Living Arrangements/Services Living arrangements for the past 2 months: [place] ([place]) Lives with:: Facility Resident   Do you feel safe going back to the place where you live?: Yes               Activities of Daily Living Home Assistive Devices/Equipment: Environmental consultant (specify type) ADL Screening (condition at time of admission) Patient's cognitive ability adequate to safely complete daily activities?: No Is the patient deaf or have difficulty hearing?: No Does the patient have difficulty seeing, even when wearing glasses/contacts?: No Does the patient have difficulty concentrating, remembering, or making decisions?: No Patient able to express need for assistance with ADLs?: Yes Does the patient have difficulty  dressing or bathing?: Yes Independently performs ADLs?: No Does the patient have difficulty walking or climbing stairs?: Yes Weakness of Legs: Both Weakness of Arms/Hands: None  Permission Sought/Granted                  Emotional Assessment Appearance:: Appears stated age Attitude/Demeanor/Rapport: Gracious Affect (typically observed): Calm Orientation: : Oriented to Self, Oriented to Place, Oriented to  Time, Oriented to Situation Alcohol / Substance Use: Not Applicable Psych Involvement: No (comment)  Admission diagnosis:  SOB (shortness of breath) [R06.02] Pleural effusion [J90] CHF exacerbation (HCC) [I50.9] Nonspecific chest pain [R07.9] Patient Active Problem List   Diagnosis Date Noted   CHF exacerbation ([place]) 08/26/2021   Bilateral pleural effusion    Acute CHF (congestive heart failure) (HCC)    Chest pain    Pancytopenia ([place])    Acute on chronic systolic CHF (congestive heart failure) ([place])    Malnutrition of moderate degree 78/93/8101   Metabolic encephalopathy 75/05/2584   Failed spinal cord stimulator, subsequent encounter 08/08/2020   S/P insertion of spinal cord stimulator 04/25/2020   Lumbar radiculopathy 12/07/2019   Lumbar facet arthropathy 12/07/2019   Spinal stenosis, lumbar region, with neurogenic claudication 12/07/2019   Osteoporosis 11/24/2019   Lumbar herniated disc 06/09/2019   Lumbar degenerative disc disease 06/09/2019   Spinal stenosis of cervical region 05/07/2019  Lumbar spondylosis 05/07/2019   Recurrent UTI 05/07/2019   Abnormal MRI, lumbar spine 02/18/2019   Abnormal gait 02/18/2019   Chronic back pain 02/18/2019   Osteoarthritis 10/09/2018   Overactive bladder 10/09/2018   Chronic midline low back pain 04/22/2018   Chronic pain of left knee 04/22/2018   Toe fracture, left 03/10/2018   Lightheadedness 01/21/2018   PAC (premature atrial contraction) 01/19/2018   PVC's (premature ventricular contractions) 01/19/2018    Hernia, paraesophageal 12/16/2017   Kidney cysts 12/16/2017   CKD (chronic kidney disease) stage 3, GFR 30-59 ml/min (HCC) 12/16/2017   Carotid artery stenosis 12/16/2017   Chronic pain 12/16/2017   Osteopenia 12/16/2017   DDD (degenerative disc disease), cervical 12/16/2017   Labile hypertension 09/05/2017   Anxiety and depression 08/27/2017   Insomnia 08/27/2017   Syncope 08/27/2017   Bilateral occipital neuralgia 08/27/2017   Lung nodule 06/30/2017   Coronary artery disease of native artery of native heart with stable angina pectoris ([place]) 06/03/2017   SOB (shortness of breath) 04/11/2017   Labile blood pressure 03/08/2017   Syncope, near 03/08/2017   Palpitations 03/08/2017   Orthostatic lightheadedness 03/08/2017   Essential hypertension    Overweight (BMI 25.0-29.9) 06/17/2015   S/P TKR (total knee replacement) 04/17/2015   Morbid obesity ([place]) 09/21/2014   Medication management 09/21/2014   Hypothyroidism 02/17/2014   Mixed hyperlipidemia    GERD (gastroesophageal reflux disease)    Vitamin D deficiency    Depression    PCP:  Virgie Dad, FNP Pharmacy:   [facility] - [place], [address] [place] [address] Phone: [phone] Fax: [phone]  [facility] [place] [address] [address] Phone: [phone] Fax: [phone]  [id number] - [place], [address] [place] [address] Phone: [phone] Fax: [phone]     Social Determinants of Health (SDOH) Interventions    Readmission Risk Interventions No flowsheet data found.

## 2021-08-27 NOTE — NC FL2 (Signed)
[place] LEVEL OF CARE SCREENING TOOL     IDENTIFICATION  Patient Name: Kimberly Cook Birthdate: [birth year] Sex: female Admission Date (Current Location): 08/25/2021  [facility] and [place] Number:  Engineering geologist and Address:  [facility], [address], [place], [place] [address]      Provider Number: [id number]  Attending Physician Name and Address:  Loletha Grayer, MD  Relative Name and Phone Number:  Arrie Aran [phone]    Current Level of Care: Hospital Recommended Level of Care: [place] Prior Approval Number:    Date Approved/Denied:   PASRR Number: [id number]  Discharge Plan: SNF    Current Diagnoses: Patient Active Problem List   Diagnosis Date Noted   CHF exacerbation ([place]) 08/26/2021   Bilateral pleural effusion    Acute CHF (congestive heart failure) (HCC)    Chest pain    Pancytopenia (HCC)    Acute on chronic systolic CHF (congestive heart failure) ([place])    Malnutrition of moderate degree 123456   Metabolic encephalopathy [id number]   Failed spinal cord stimulator, subsequent encounter 08/08/2020   S/P insertion of spinal cord stimulator 04/25/2020   Lumbar radiculopathy 12/07/2019   Lumbar facet arthropathy 12/07/2019   Spinal stenosis, lumbar region, with neurogenic claudication 12/07/2019   Osteoporosis 11/24/2019   Lumbar herniated disc 06/09/2019   Lumbar degenerative disc disease 06/09/2019   Spinal stenosis of cervical region 05/07/2019   Lumbar spondylosis 05/07/2019   Recurrent UTI 05/07/2019   Abnormal MRI, lumbar spine 02/18/2019   Abnormal gait 02/18/2019   Chronic back pain 02/18/2019   Osteoarthritis 10/09/2018   Overactive bladder 10/09/2018   Chronic midline low back pain 04/22/2018   Chronic pain of left knee 04/22/2018   Toe fracture, left 03/10/2018   Lightheadedness 01/21/2018   PAC (premature atrial contraction) 01/19/2018   PVC's (premature ventricular  contractions) 01/19/2018   Hernia, paraesophageal 12/16/2017   Kidney cysts 12/16/2017   CKD (chronic kidney disease) stage 3, GFR 30-59 ml/min (HCC) 12/16/2017   Carotid artery stenosis 12/16/2017   Chronic pain 12/16/2017   Osteopenia 12/16/2017   DDD (degenerative disc disease), cervical 12/16/2017   Labile hypertension 09/05/2017   Anxiety and depression 08/27/2017   Insomnia 08/27/2017   Syncope 08/27/2017   Bilateral occipital neuralgia 08/27/2017   Lung nodule 06/30/2017   Coronary artery disease of native artery of native heart with stable angina pectoris ([place]) 06/03/2017   SOB (shortness of breath) 04/11/2017   Labile blood pressure 03/08/2017   Syncope, near 03/08/2017   Palpitations 03/08/2017   Orthostatic lightheadedness 03/08/2017   Essential hypertension    Overweight (BMI 25.0-29.9) 06/17/2015   S/P TKR (total knee replacement) 04/17/2015   Morbid obesity ([place]) 09/21/2014   Medication management 09/21/2014   Hypothyroidism 02/17/2014   Mixed hyperlipidemia    GERD (gastroesophageal reflux disease)    Vitamin D deficiency    Depression     Orientation RESPIRATION BLADDER Height & Weight     Self, Time, Situation, Place  O2 (2L nasal cannula) Continent Weight: 170 lb 3.1 oz (77.2 kg) Height:  5\' 4"  (162.6 cm)  BEHAVIORAL SYMPTOMS/MOOD NEUROLOGICAL BOWEL NUTRITION STATUS      Continent Diet (see discharge summary)  AMBULATORY STATUS COMMUNICATION OF NEEDS Skin   Limited Assist Verbally Other (Comment) (skin tear left arm)                       Personal Care Assistance Level of Assistance  Bathing,  Feeding, Dressing, Total care Bathing Assistance: Limited assistance Feeding assistance: Independent Dressing Assistance: Limited assistance Total Care Assistance: Limited assistance   Functional Limitations Info  Sight, Hearing, Speech Sight Info: Adequate Hearing Info: Adequate Speech Info: Adequate    SPECIAL CARE FACTORS FREQUENCY  PT (By  licensed PT), OT (By licensed OT)     PT Frequency: min 5x weekly OT Frequency: min 5x weekly            Contractures Contractures Info: Not present    Additional Factors Info  Code Status, Allergies Code Status Info: DNR Allergies Info: No Known Allergies           Current Medications (08/27/2021):  This is the current hospital active medication list Current Facility-Administered Medications  Medication Dose Route Frequency Provider Last Rate Last Admin   acetaminophen (TYLENOL) tablet 650 mg  650 mg Oral Q6H PRN Athena Masse, MD   650 mg at 08/27/21 1446   Or   acetaminophen (TYLENOL) suppository 650 mg  650 mg Rectal Q6H PRN Athena Masse, MD       ALPRAZolam Duanne Moron) tablet 0.5 mg  0.5 mg Oral BID PRN Loletha Grayer, MD       aspirin EC tablet 81 mg  81 mg Oral QHS Loletha Grayer, MD   81 mg at 08/26/21 2243   atorvastatin (LIPITOR) tablet 20 mg  20 mg Oral Daily Loletha Grayer, MD   20 mg at 08/27/21 [id number]   carvedilol (COREG) tablet 6.25 mg  6.25 mg Oral BID WC Athena Masse, MD   6.25 mg at 08/27/21 [id number]   cholecalciferol (VITAMIN D3) tablet 5,000 Units  5,000 Units Oral Daily Loletha Grayer, MD   5,000 Units at 08/27/21 0920   enoxaparin (LOVENOX) injection 40 mg  40 mg Subcutaneous Q24H Judd Gaudier V, MD   40 mg at 08/27/21 0911   escitalopram (LEXAPRO) tablet 5 mg  5 mg Oral Haskel Khan, Richard, MD   5 mg at 0000000 [id number]   folic acid (FOLVITE) tablet 0.5 mg  0.5 mg Oral Daily Loletha Grayer, MD   0.5 mg at 08/27/21 [id number]   furosemide (LASIX) injection 40 mg  40 mg Intravenous BID Loletha Grayer, MD   40 mg at 08/27/21 [id number]   levothyroxine (SYNTHROID) tablet 88 mcg  88 mcg Oral QAC breakfast Wynelle Cleveland, RPH   88 mcg at 08/27/21 0604   lidocaine (LIDODERM) 5 % 1-2 patch  1-2 patch Transdermal Daily PRN Loletha Grayer, MD   1 patch at 08/27/21 1445   loratadine (CLARITIN) tablet 10 mg  10 mg Oral Daily Loletha Grayer, MD   10 mg at  08/27/21 0908   [START ON 08/28/2021] losartan (COZAAR) tablet 25 mg  25 mg Oral Daily Theora Gianotti, NP       magnesium oxide (MAG-OX) tablet 400 mg  400 mg Oral Daily Loletha Grayer, MD   400 mg at 08/27/21 1446   melatonin tablet 5 mg  5 mg Oral QHS Loletha Grayer, MD   5 mg at 08/26/21 2243   mirabegron ER (MYRBETRIQ) tablet 50 mg  50 mg Oral Daily Loletha Grayer, MD   50 mg at 08/27/21 0932   montelukast (SINGULAIR) tablet 10 mg  10 mg Oral QHS Loletha Grayer, MD   10 mg at 08/26/21 2243   ondansetron (ZOFRAN) tablet 4 mg  4 mg Oral Q6H PRN Athena Masse, MD       Or   ondansetron [facility]) injection  4 mg  4 mg Intravenous Q6H PRN Athena Masse, MD       pantoprazole (PROTONIX) EC tablet 40 mg  40 mg Oral Daily Wieting, Richard, MD   40 mg at 08/27/21 0908   polyethylene glycol (MIRALAX / GLYCOLAX) packet 17 g  17 g Oral Daily PRN Benita Gutter, RPH       potassium chloride SA (KLOR-CON M) CR tablet 20 mEq  20 mEq Oral BID Wieting, Richard, MD       pregabalin (LYRICA) capsule 75 mg  75 mg Oral TID Loletha Grayer, MD   75 mg at 08/27/21 1446   traZODone (DESYREL) tablet 50 mg  50 mg Oral QHS Loletha Grayer, MD   50 mg at 08/26/21 2243   vitamin B-12 (CYANOCOBALAMIN) tablet 1,000 mcg  1,000 mcg Oral Daily Loletha Grayer, MD   1,000 mcg at 08/27/21 [id number]     Discharge Medications: Please see discharge summary for a list of discharge medications.  Relevant Imaging Results:  Relevant Lab Results:   Additional Information [phone]  Alberteen Sam, LCSW

## 2021-08-28 ENCOUNTER — Encounter
Admission: EM | Disposition: A | Discharge status: Skilled Nursing Facility | Payer: Self-pay | Source: Skilled Nursing Facility | Attending: Internal Medicine

## 2021-08-28 ENCOUNTER — Encounter: Payer: Self-pay | Admitting: Internal Medicine

## 2021-08-28 DIAGNOSIS — R531 Weakness: Secondary | ICD-10-CM

## 2021-08-28 DIAGNOSIS — E785 Hyperlipidemia, unspecified: Secondary | ICD-10-CM

## 2021-08-28 DIAGNOSIS — I428 Other cardiomyopathies: Secondary | ICD-10-CM

## 2021-08-28 DIAGNOSIS — I420 Dilated cardiomyopathy: Secondary | ICD-10-CM

## 2021-08-28 DIAGNOSIS — I272 Pulmonary hypertension, unspecified: Secondary | ICD-10-CM

## 2021-08-28 HISTORY — PX: RIGHT/LEFT HEART CATH AND CORONARY ANGIOGRAPHY: CATH118266

## 2021-08-28 LAB — BASIC METABOLIC PANEL
Anion gap: 6 (ref 5–15)
BUN: 17 mg/dL (ref 8–23)
CO2: 29 mmol/L (ref 22–32)
Calcium: 8.6 mg/dL — ABNORMAL LOW (ref 8.9–10.3)
Chloride: 100 mmol/L (ref 98–111)
Creatinine, Ser: 0.75 mg/dL (ref 0.44–1.00)
GFR, Estimated: >60 mL/min (ref >60)
Glucose, Bld: 105 mg/dL — ABNORMAL HIGH (ref 70–99)
Potassium: 3.7 mmol/L (ref 3.5–5.1)
Sodium: 135 mmol/L (ref 135–145)

## 2021-08-28 LAB — PROTEIN, BODY FLUID (OTHER): Total Protein, Body Fluid Other: 2.3 g/dL

## 2021-08-28 LAB — CYTOLOGY - NON PAP

## 2021-08-28 SURGERY — RIGHT/LEFT HEART CATH AND CORONARY ANGIOGRAPHY
Anesthesia: Moderate Sedation

## 2021-08-28 MED ORDER — LIDOCAINE HCL 1 % IJ SOLN
INTRAMUSCULAR | Status: AC
  Filled 2021-08-28: qty 20

## 2021-08-28 MED ORDER — SODIUM CHLORIDE 0.9% FLUSH: 3.0000 mL | INTRAVENOUS | Status: DC | PRN

## 2021-08-28 MED ORDER — HEPARIN SODIUM (PORCINE) 1000 UNIT/ML IJ SOLN
INTRAMUSCULAR | Status: DC | PRN
  Administered 2021-08-28: 3000 [IU] via INTRAVENOUS

## 2021-08-28 MED ORDER — MIDAZOLAM HCL 2 MG/2ML IJ SOLN
INTRAMUSCULAR | Status: DC | PRN
  Administered 2021-08-28: 1 mg via INTRAVENOUS

## 2021-08-28 MED ORDER — LIDOCAINE HCL (PF) 1 % IJ SOLN
INTRAMUSCULAR | Status: DC | PRN
  Administered 2021-08-28: 5 mL

## 2021-08-28 MED ORDER — VERAPAMIL HCL 2.5 MG/ML IV SOLN
INTRAVENOUS | Status: AC
  Filled 2021-08-28: qty 2

## 2021-08-28 MED ORDER — SODIUM CHLORIDE 0.9 % IV SOLN: 250.0000 mL | INTRAVENOUS | Status: DC | PRN

## 2021-08-28 MED ORDER — FENTANYL CITRATE (PF) 100 MCG/2ML IJ SOLN
INTRAMUSCULAR | Status: AC
  Filled 2021-08-28: qty 2

## 2021-08-28 MED ORDER — HEPARIN (PORCINE) IN NACL 1000-0.9 UT/500ML-% IV SOLN
INTRAVENOUS | Status: AC
  Filled 2021-08-28: qty 1000

## 2021-08-28 MED ORDER — FENTANYL CITRATE (PF) 100 MCG/2ML IJ SOLN
INTRAMUSCULAR | Status: DC | PRN
  Administered 2021-08-28: 25 ug via INTRAVENOUS

## 2021-08-28 MED ORDER — FUROSEMIDE 10 MG/ML IJ SOLN
40.0000 mg | Freq: Two times a day (BID) | INTRAMUSCULAR | Status: AC
  Administered 2021-08-29: 09:00:00 40 mg via INTRAVENOUS
  Filled 2021-08-28: qty 4

## 2021-08-28 MED ORDER — VERAPAMIL HCL 2.5 MG/ML IV SOLN
INTRAVENOUS | Status: DC | PRN
  Administered 2021-08-28: 2.5 mg via INTRA_ARTERIAL

## 2021-08-28 MED ORDER — IOHEXOL 300 MG/ML  SOLN
INTRAMUSCULAR | Status: DC | PRN
  Administered 2021-08-28: 16:00:00 34 mL

## 2021-08-28 MED ORDER — SODIUM CHLORIDE 0.9% FLUSH
3.0000 mL | Freq: Two times a day (BID) | INTRAVENOUS | Status: DC
  Administered 2021-08-28 – 2021-08-30 (×3): 3 mL via INTRAVENOUS

## 2021-08-28 MED ORDER — HEPARIN (PORCINE) IN NACL 1000-0.9 UT/500ML-% IV SOLN
INTRAVENOUS | Status: DC | PRN
  Administered 2021-08-28 (×2): 500 mL

## 2021-08-28 MED ORDER — MIDAZOLAM HCL 2 MG/2ML IJ SOLN
INTRAMUSCULAR | Status: AC
  Filled 2021-08-28: qty 2

## 2021-08-28 MED ORDER — HEPARIN SODIUM (PORCINE) 1000 UNIT/ML IJ SOLN
INTRAMUSCULAR | Status: AC
  Filled 2021-08-28: qty 10

## 2021-08-28 MED ORDER — FUROSEMIDE 40 MG PO TABS
40.0000 mg | ORAL_TABLET | Freq: Every day | ORAL | Status: DC
  Administered 2021-08-29 – 2021-08-30 (×2): 40 mg via ORAL
  Filled 2021-08-28 (×4): qty 1

## 2021-08-28 SURGICAL SUPPLY — 12 items
CATH BALLN WEDGE 5F 110CM (CATHETERS) ×1 IMPLANT
CATH OPTITORQUE JACKY 4.0 5F (CATHETERS) ×1 IMPLANT
DEVICE RAD TR BAND REGULAR (VASCULAR PRODUCTS) ×1 IMPLANT
DRAPE BRACHIAL (DRAPES) ×2 IMPLANT
GLIDESHEATH SLEND SS 6F .021 (SHEATH) ×1 IMPLANT
GUIDEWIRE INQWIRE 1.5J.035X260 (WIRE) IMPLANT
INQWIRE 1.5J .035X260CM (WIRE) ×2
PACK CARDIAC CATH (CUSTOM PROCEDURE TRAY) ×2 IMPLANT
PROTECTION STATION PRESSURIZED (MISCELLANEOUS) ×2
SET ATX SIMPLICITY (MISCELLANEOUS) ×1 IMPLANT
SHEATH GLIDE SLENDER 4/5FR (SHEATH) ×1 IMPLANT
STATION PROTECTION PRESSURIZED (MISCELLANEOUS) IMPLANT

## 2021-08-28 NOTE — Progress Notes (Signed)
Patient ID: Kimberly Cook, female   DOB: Mar 11, 1946, 76 y.o.   MRN: [id number] [facility] PROGRESS NOTE  Kimberly Cook [id number] DOB: 1946/05/31 DOA: 08/25/2021 PCP: Virgie Dad, FNP  HPI/Subjective: Patient feeling a little bit better than when she came in.  Still has some shortness of breath.  This afternoon did have a little chest pain.  Admitted with CHF exacerbation and pleural effusion.  Objective: Vitals:   08/28/21 0821 08/28/21 1200  BP: 108/72 (!) 83/69  Pulse: 70 63  Resp: 18 18  Temp: 98.2 F (36.8 C) 98.1 F (36.7 C)  SpO2: 100% 100%    Intake/Output Summary (Last 24 hours) at 08/28/2021 1313 Last data filed at 08/28/2021 1157 Gross per 24 hour  Intake 940 ml  Output 2000 ml  Net -1060 ml   Filed Weights   08/25/21 1903 08/27/21 0500 08/28/21 0600  Weight: 72 kg 77.2 kg 74.7 kg    ROS: Review of Systems  Respiratory:  Positive for shortness of breath.   Cardiovascular:  Negative for chest pain.  Gastrointestinal:  Negative for abdominal pain, nausea and vomiting.  Exam: Physical Exam HENT:     Head: Normocephalic.     Mouth/Throat:     Pharynx: No oropharyngeal exudate.  Eyes:     General: Lids are normal.     Conjunctiva/sclera: Conjunctivae normal.  Cardiovascular:     Rate and Rhythm: Normal rate and regular rhythm.     Heart sounds: Normal heart sounds and S1 normal.  Pulmonary:     Breath sounds: Examination of the right-lower field reveals decreased breath sounds. Examination of the left-lower field reveals decreased breath sounds. Decreased breath sounds present. No wheezing, rhonchi or rales.  Abdominal:     Palpations: Abdomen is soft.     Tenderness: There is no abdominal tenderness.  Musculoskeletal:     Right lower leg: Swelling present.     Left lower leg: Swelling present.  Skin:    General: Skin is warm.     Findings: No rash.  Neurological:     Mental Status: She is alert and oriented to person, place, and  time.      Scheduled Meds:  aspirin EC  81 mg Oral QHS   atorvastatin  20 mg Oral Daily   carvedilol  6.25 mg Oral BID WC   cholecalciferol  5,000 Units Oral Daily   enoxaparin (LOVENOX) injection  40 mg Subcutaneous Q24H   escitalopram  5 mg Oral 123456   folic acid  0.5 mg Oral Daily   furosemide  40 mg Intravenous BID   levothyroxine  88 mcg Oral QAC breakfast   loratadine  10 mg Oral Daily   losartan  25 mg Oral Daily   magnesium oxide  400 mg Oral Daily   melatonin  5 mg Oral QHS   mirabegron ER  50 mg Oral Daily   montelukast  10 mg Oral QHS   pantoprazole  40 mg Oral Daily   potassium chloride  20 mEq Oral BID   pregabalin  75 mg Oral TID   sodium chloride flush  3 mL Intravenous Q12H   traZODone  50 mg Oral QHS   vitamin B-12  1,000 mcg Oral Daily   Continuous Infusions:  sodium chloride     sodium chloride 10 mL/hr at 08/28/21 0601   Brief history 76 year old female that has been in [facility] since her back surgery 6 months ago, previous history of stroke, GERD, essential hypertension,  CAD, chronic back pain, hypothyroidism, depression.  She presented with shortness of breath and weight gain.  Diagnosed with acute on chronic systolic congestive heart failure.  Patient had left-sided thoracentesis on 08/27/2021 removing 500 mL of fluid underneath the left lung.  Patient will be brought for cardiac catheterization on 08/28/2021.  Assessment/Plan:  Acute on chronic systolic congestive heart failure with pericardial effusion.  EF 30 to 35%.  Patient is on Lasix 40 mg twice a day and losartan and Coreg.  Blood pressure on the lower side right now.  May need to cut back on medications.  Repeat blood pressure 107/71. Bilateral pleural effusions.  Interventional radiology did a thoracentesis on 08/27/2021 and removed 500 mL underneath the left lung. Essential hypertension.  Initial blood pressures were high when she had a lot of difficulty breathing.  Now blood pressure on  the lower side.  Cardiology changed meds to Coreg and losartan.  Repeat blood pressure 107/71. Pancytopenia.  Hepatitis C is negative Weakness.  Patient has been in rehab since her back surgery in June.  Continue PT and OT consultations Hypothyroidism unspecified on levothyroxine Neuropathy on gabapentin Hyperlipidemia unspecified on atorvastatin Depression on Lexapro Previous completed stroke on aspirin and Lipitor Patient is a DO NOT RESUSCITATE     Code Status:     Code Status Orders  (From admission, onward)           Start     Ordered   08/26/21 1139  Do not attempt resuscitation (DNR)  Continuous       Question Answer Comment  In the event of cardiac or respiratory ARREST Do not call a code blue   In the event of cardiac or respiratory ARREST Do not perform Intubation, CPR, defibrillation or ACLS   In the event of cardiac or respiratory ARREST Use medication by any route, position, wound care, and other measures to relive pain and suffering. May use oxygen, suction and manual treatment of airway obstruction as needed for comfort.   Comments nurse may pronounce      08/26/21 1138           Code Status History     Date Active Date Inactive Code Status Order ID Comments User Context   08/26/2021 0246 08/26/2021 1138 Full Code [id number]  Athena Masse, MD ED   04/01/2021 0224 04/04/2021 2200 DNR [id number]  Sidney Ace, Arvella Merles, MD Inpatient   03/31/2021 2143 04/01/2021 0224 Full Code [id number]  Mansy, Arvella Merles, MD ED   01/24/2020 1738 01/25/2020 1907 Full Code [id number]  Lonell Face, NP Inpatient   12/13/2015 2059 12/14/2015 2049 Full Code [id number]  Nita Sells, MD Inpatient   04/17/2015 1712 04/19/2015 1446 Full Code [id number]  Norman Herrlich Inpatient      Advance Directive Documentation    Flowsheet Row Most Recent Value  Type of Advance Directive Out of facility DNR (pink MOST or yellow form)  Pre-existing out of facility DNR order (yellow form or pink  MOST form) --  "MOST" Form in Place? --      Family Communication: Updated patient's son Darnell Level on the phone Disposition Plan: Status is: Inpatient  The patient will have a cardiac cath today.  With blood pressure on the lower side right now may have to give a little fluid back and adjust medications.  [place]  [facility] [employer]

## 2021-08-28 NOTE — Progress Notes (Signed)
Progress Note  Patient Name: Kimberly Cook Date of Encounter: 08/28/2021  [facility] Cardiologist: Nelva Bush, MD   Subjective   Reports still with shortness of breath though mild improvement with several days of diuresis Still requiring oxygen she feels Scheduled for right and left heart catheterization this afternoon  Inpatient Medications    Scheduled Meds:  aspirin EC  81 mg Oral QHS   atorvastatin  20 mg Oral Daily   carvedilol  6.25 mg Oral BID WC   cholecalciferol  5,000 Units Oral Daily   enoxaparin (LOVENOX) injection  40 mg Subcutaneous Q24H   escitalopram  5 mg Oral 123456   folic acid  0.5 mg Oral Daily   furosemide  40 mg Intravenous BID   levothyroxine  88 mcg Oral QAC breakfast   loratadine  10 mg Oral Daily   losartan  25 mg Oral Daily   magnesium oxide  400 mg Oral Daily   melatonin  5 mg Oral QHS   mirabegron ER  50 mg Oral Daily   montelukast  10 mg Oral QHS   pantoprazole  40 mg Oral Daily   potassium chloride  20 mEq Oral BID   pregabalin  75 mg Oral TID   sodium chloride flush  3 mL Intravenous Q12H   traZODone  50 mg Oral QHS   vitamin B-12  1,000 mcg Oral Daily   Continuous Infusions:  sodium chloride     sodium chloride 10 mL/hr at 08/28/21 0601   PRN Meds: sodium chloride, acetaminophen **OR** acetaminophen, ALPRAZolam, lidocaine, ondansetron **OR** ondansetron (ZOFRAN) IV, polyethylene glycol, sodium chloride flush   Vital Signs    Vitals:   08/27/21 2349 08/28/21 0337 08/28/21 0600 08/28/21 0821  BP: (!) 86/58 (!) 103/56  108/72  Pulse: 62 71  70  Resp: 17 18  18   Temp: 98.4 F (36.9 C) 97.7 F (36.5 C)  98.2 F (36.8 C)  TempSrc:  Oral    SpO2: 97% 97%  100%  Weight:   74.7 kg   Height:        Intake/Output Summary (Last 24 hours) at 08/28/2021 1014 Last data filed at 08/28/2021 0935 Gross per 24 hour  Intake 1180 ml  Output 1100 ml  Net 80 ml   Last 3 Weights 08/28/2021 08/27/2021 08/25/2021  Weight (lbs)  164 lb 10.9 oz 170 lb 3.1 oz 158 lb 11.7 oz  Weight (kg) 74.7 kg 77.2 kg 72 kg      Telemetry    Normal sinus rhythm- Personally Reviewed  ECG     - Personally Reviewed  Physical Exam   GEN: No acute distress.   Neck: No JVD Cardiac: RRR, no murmurs, rubs, or gallops.  Respiratory: Clear to auscultation bilaterally. GI: Soft, nontender, non-distended  MS: No edema; No deformity. Neuro:  Nonfocal  Psych: Normal affect   Labs    High Sensitivity Troponin:   Recent Labs  Lab 08/25/21 1913 08/25/21 2139  TROPONINIHS 7 7     Chemistry Recent Labs  Lab 08/26/21 0448 08/27/21 0606 08/28/21 0804  NA 139 137 135  K 3.9 3.5 3.7  CL 108 103 100  CO2 25 28 29   GLUCOSE 89 89 105*  BUN 23 18 17   CREATININE 0.81 0.75 0.75  CALCIUM 8.4* 8.3* 8.6*  MG 1.7 1.7  --   GFRNONAA >60 >60 >60  ANIONGAP 6 6 6     Lipids No results for input(s): CHOL, TRIG, HDL, LABVLDL, LDLCALC, CHOLHDL in  the last 168 hours.  Hematology Recent Labs  Lab 08/25/21 1913 08/26/21 0448  WBC 3.9* 3.5*  RBC 3.73* 3.64*  HGB 11.9* 11.1*  HCT 35.7* 34.7*  MCV 95.7 95.3  MCH 31.9 30.5  MCHC 33.3 32.0  RDW 14.0 13.9  PLT 124* 119*   Thyroid No results for input(s): TSH, FREET4 in the last 168 hours.  BNP Recent Labs  Lab 08/25/21 1913  BNP 353.9*    DDimer No results for input(s): DDIMER in the last 168 hours.   Radiology    DG Chest 2 View  Result Date: 08/27/2021 CLINICAL DATA:  76 year old female with a history of CHF EXAM: CHEST - 2 VIEW COMPARISON:  08/25/2021 FINDINGS: Cardiomediastinal silhouette unchanged with cardiomegaly. Low lung volumes persist with mixed vague interstitial and airspace opacities at the lung bases. Mild interlobular septal thickening with blunting of the bilateral costophrenic angles, and meniscus on the lateral view in the costophrenic sulcus. Thickening of the fissures. No pneumothorax. No new confluent airspace disease. Surgical changes of the thoracolumbar  region. IMPRESSION: Acute CHF, with bilateral pleural effusions. Overall appears slightly increased from the prior. Electronically Signed   By: Corrie Mckusick D.O.   On: 08/27/2021 08:15   DG Chest Port 1 View  Result Date: 08/27/2021 CLINICAL DATA:  Status post thoracentesis EXAM: PORTABLE CHEST 1 VIEW COMPARISON:  08/27/2021 FINDINGS: There is interval decrease in the left pleural effusion. There is blunting of both lateral CP angles. Transverse diameter of heart is increased. Central pulmonary vessels are prominent. There is no pneumothorax. IMPRESSION: There is interval decrease in the left pleural effusion. There is no pneumothorax. Cardiomegaly. Central pulmonary vessels are prominent suggesting possible mild CHF. Small bilateral pleural effusions. Electronically Signed   By: Elmer Picker M.D.   On: 08/27/2021 12:57   ECHOCARDIOGRAM COMPLETE  Result Date: 08/26/2021    ECHOCARDIOGRAM REPORT   Patient Name:   Kimberly Cook Date of Exam: 08/26/2021 Medical Rec #:  [id number]         Height:       64.0 in Accession #:    [id number]        Weight:       158.7 lb Date of Birth:  April 03, 1946         BSA:          1.773 m Patient Age:    7 years          BP:           100/88 mmHg Patient Gender: F                 HR:           78 bpm. Exam Location:  [facility] Procedure: 2D Echo, Cardiac Doppler and Color Doppler Indications:     CHF-Acute Diastolic [id number]  History:         Patient has prior history of Echocardiogram examinations.                  Stroke. Palps.  Sonographer:     Alyse Low Roar Referring Phys:  [id number] Loletha Grayer Diagnosing Phys: Ida Rogue MD IMPRESSIONS  1. Left ventricular ejection fraction, by estimation, is 30 to 35%. The left ventricle has moderately decreased function. The left ventricle demonstrates global hypokinesis. The left ventricular internal cavity size was mildly dilated. Left ventricular diastolic parameters are consistent with Grade I diastolic dysfunction (impaired  relaxation).  2. Right ventricular systolic function is normal. The right ventricular  size is normal. Tricuspid regurgitation signal is inadequate for assessing PA pressure.  3. Left atrial size was mild to moderately dilated.  4. Moderate pericardial effusion. The pericardial effusion is circumferential. There is no evidence of cardiac tamponade. Moderate pleural effusion.  5. The mitral valve is normal in structure. Moderate mitral valve regurgitation. No evidence of mitral stenosis.  6. The aortic valve is normal in structure. Aortic valve regurgitation is not visualized. Aortic valve sclerosis is present, with no evidence of aortic valve stenosis. FINDINGS  Left Ventricle: Left ventricular ejection fraction, by estimation, is 30 to 35%. The left ventricle has moderately decreased function. The left ventricle demonstrates global hypokinesis. The left ventricular internal cavity size was mildly dilated. There is no left ventricular hypertrophy. Left ventricular diastolic parameters are consistent with Grade I diastolic dysfunction (impaired relaxation). Right Ventricle: The right ventricular size is normal. No increase in right ventricular wall thickness. Right ventricular systolic function is normal. Tricuspid regurgitation signal is inadequate for assessing PA pressure. Left Atrium: Left atrial size was mild to moderately dilated. Right Atrium: Right atrial size was normal in size. Pericardium: A moderately sized pericardial effusion is present. The pericardial effusion is circumferential. There is diastolic collapse of the right atrial wall. There is no evidence of cardiac tamponade. Mitral Valve: The mitral valve is normal in structure. Moderate mitral valve regurgitation. No evidence of mitral valve stenosis. Tricuspid Valve: The tricuspid valve is normal in structure. Tricuspid valve regurgitation is mild . No evidence of tricuspid stenosis. Aortic Valve: The aortic valve is normal in structure. Aortic valve  regurgitation is not visualized. Aortic valve sclerosis is present, with no evidence of aortic valve stenosis. Aortic valve peak gradient measures 9.9 mmHg. Pulmonic Valve: The pulmonic valve was normal in structure. Pulmonic valve regurgitation is not visualized. No evidence of pulmonic stenosis. Aorta: The aortic root is normal in size and structure. Venous: The inferior vena cava was not well visualized. IAS/Shunts: No atrial level shunt detected by color flow Doppler. Additional Comments: There is a moderate pleural effusion.  LEFT VENTRICLE PLAX 2D LVIDd:         5.10 cm      Diastology LVIDs:         4.40 cm      LV e' medial:    4.90 cm/s LV PW:         0.90 cm      LV E/e' medial:  19.3 LV IVS:        0.80 cm      LV e' lateral:   7.62 cm/s LVOT diam:     1.70 cm      LV E/e' lateral: 12.4 LVOT Area:     2.27 cm  LV Volumes (MOD) LV vol d, MOD A2C: 102.0 ml LV vol d, MOD A4C: 97.8 ml LV vol s, MOD A2C: 71.3 ml LV vol s, MOD A4C: 72.5 ml LV SV MOD A2C:     30.7 ml LV SV MOD A4C:     97.8 ml LV SV MOD BP:      28.7 ml RIGHT VENTRICLE RV Mid diam:    2.80 cm RV S prime:     10.00 cm/s TAPSE (M-mode): 2.0 cm LEFT ATRIUM             Index        RIGHT ATRIUM           Index LA diam:        4.60 cm 2.59  cm/m   RA Area:     15.90 cm LA Vol (A2C):   66.4 ml 37.44 ml/m  RA Volume:   39.10 ml  22.05 ml/m LA Vol (A4C):   67.9 ml 38.29 ml/m LA Biplane Vol: 72.8 ml 41.05 ml/m  AORTIC VALVE                 PULMONIC VALVE AV Area (Vmax): 1.07 cm     PV Vmax:          0.86 m/s AV Vmax:        157.00 cm/s  PV Peak grad:     2.9 mmHg AV Peak Grad:   9.9 mmHg     PR End Diast Vel: 2.57 msec LVOT Vmax:      74.10 cm/s   RVOT Peak grad:   1 mmHg  AORTA Ao Root diam: 2.70 cm Ao Asc diam:  2.80 cm MITRAL VALVE                TRICUSPID VALVE MV Area (PHT): 3.15 cm     TR Peak grad:   18.7 mmHg MV Decel Time: 241 msec     TR Vmax:        216.00 cm/s MV E velocity: 94.70 cm/s MV A velocity: 114.00 cm/s  SHUNTS MV E/A ratio:   0.83         Systemic Diam: 1.70 cm MV A Prime:    10.7 cm/s Ida Rogue MD Electronically signed by Ida Rogue MD Signature Date/Time: 08/26/2021/12:50:20 PM    Final    US THORACENTESIS ASP PLEURAL SPACE W/IMG GUIDE  Result Date: 08/27/2021 INDICATION: Shortness of breath with bilateral pleural effusions request received for diagnostic and therapeutic thoracentesis. EXAM: ULTRASOUND GUIDED LEFT THORACENTESIS MEDICATIONS: Local 1% lidocaine only COMPLICATIONS: None immediate.  No pneumothorax on follow-up chest radiograph. PROCEDURE: An ultrasound guided thoracentesis was thoroughly discussed with the patient and questions answered. The benefits, risks, alternatives and complications were also discussed. The patient understands and wishes to proceed with the procedure. Written consent was obtained. Ultrasound was performed to localize and mark an adequate pocket of fluid in the left chest. The area was then prepped and draped in the normal sterile fashion. 1% Lidocaine was used for local anesthesia. Under ultrasound guidance a 19 gauge, 7-cm, Yueh catheter was introduced. Thoracentesis was performed. The catheter was removed and a dressing applied. FINDINGS: A total of approximately 500 mL of clear yellow fluid was removed. Samples were sent to the laboratory as requested by the clinical team. IMPRESSION: Successful ultrasound guided left thoracentesis yielding 500 mL of pleural fluid. This exam was performed by Tsosie Billing PA-C, and was supervised and interpreted by Dr. Vernard Gambles. Electronically Signed   By: Lucrezia Europe M.D.   On: 08/27/2021 13:45    Cardiac Studies   Echo  1. Left ventricular ejection fraction, by estimation, is 30 to 35%. The  left ventricle has moderately decreased function. The left ventricle  demonstrates global hypokinesis. The left ventricular internal cavity size  was mildly dilated. Left ventricular  diastolic parameters are consistent with Grade I diastolic dysfunction   (impaired relaxation).   2. Right ventricular systolic function is normal. The right ventricular  size is normal. Tricuspid regurgitation signal is inadequate for assessing  PA pressure.   3. Left atrial size was mild to moderately dilated.   4. Moderate pericardial effusion. The pericardial effusion is  circumferential. There is no evidence of cardiac tamponade. Moderate  pleural effusion.  5. The mitral valve is normal in structure. Moderate mitral valve  regurgitation. No evidence of mitral stenosis.   6. The aortic valve is normal in structure. Aortic valve regurgitation is  not visualized. Aortic valve sclerosis is present, with no evidence of  aortic valve stenosis.   Patient Profile     76 y.o. female w/ a h/o moderate nonobstructive CAD by coronary CTA in 04/2017, HTN, HL, and "mini-strokes," who was admitted 1/21 w/ acute CHF and found to have LV dysfunction (EF 30-35%).  Assessment & Plan    Acute systolic CHF In the setting of cardiomyopathy, ejection fraction 30 to 35% this admission Treated with IV Lasix, -4.1 L this admission, reports she is still symptomatic Scheduled for right and left heart catheterization today with Dr. Fletcher Anon Known moderate coronary disease by history --Continue carvedilol, losartan Blood pressure low limiting addition of additional agents at this time -Renal function stable on Lasix 40 IV twice daily  Cardiomyopathy Nonobstructive coronary disease by coronary CTA 2018 Reports worsening shortness of breath, cardiomyopathy this admission Ischemic work-up as above scheduled for later today  Second-degree AV block type I Noted on admission, this appears to have resolved  Moderate bilateral pleural effusions Noted on chest x-ray, confirmed on echocardiogram Underwent left thoracentesis yesterday 500 mils out For continued shortness of breath symptoms may need thoracentesis on right    For questions or updates, please contact [facility] Please consult [email or web address] for contact info under        Signed, Ida Rogue, MD  08/28/2021, 10:14 AM

## 2021-08-28 NOTE — H&P (View-Only) (Signed)
Progress Note  Patient Name: Kimberly Cook Date of Encounter: 08/28/2021  [facility] Cardiologist: Nelva Bush, MD   Subjective   Reports still with shortness of breath though mild improvement with several days of diuresis Still requiring oxygen she feels Scheduled for right and left heart catheterization this afternoon  Inpatient Medications    Scheduled Meds:  aspirin EC  81 mg Oral QHS   atorvastatin  20 mg Oral Daily   carvedilol  6.25 mg Oral BID WC   cholecalciferol  5,000 Units Oral Daily   enoxaparin (LOVENOX) injection  40 mg Subcutaneous Q24H   escitalopram  5 mg Oral [id number]   folic acid  0.5 mg Oral Daily   furosemide  40 mg Intravenous BID   levothyroxine  88 mcg Oral QAC breakfast   loratadine  10 mg Oral Daily   losartan  25 mg Oral Daily   magnesium oxide  400 mg Oral Daily   melatonin  5 mg Oral QHS   mirabegron ER  50 mg Oral Daily   montelukast  10 mg Oral QHS   pantoprazole  40 mg Oral Daily   potassium chloride  20 mEq Oral BID   pregabalin  75 mg Oral TID   sodium chloride flush  3 mL Intravenous Q12H   traZODone  50 mg Oral QHS   vitamin B-12  1,000 mcg Oral Daily   Continuous Infusions:  sodium chloride     sodium chloride 10 mL/hr at 08/28/21 0601   PRN Meds: sodium chloride, acetaminophen **OR** acetaminophen, ALPRAZolam, lidocaine, ondansetron **OR** ondansetron (ZOFRAN) IV, polyethylene glycol, sodium chloride flush   Vital Signs    Vitals:   08/27/21 2349 08/28/21 0337 08/28/21 0600 08/28/21 0821  BP: (!) 86/58 (!) 103/56  108/72  Pulse: 62 71  70  Resp: 17 18  18   Temp: 98.4 F (36.9 C) 97.7 F (36.5 C)  98.2 F (36.8 C)  TempSrc:  Oral    SpO2: 97% 97%  100%  Weight:   74.7 kg   Height:        Intake/Output Summary (Last 24 hours) at 08/28/2021 1014 Last data filed at 08/28/2021 0935 Gross per 24 hour  Intake 1180 ml  Output 1100 ml  Net 80 ml   Last 3 Weights 08/28/2021 08/27/2021 08/25/2021  Weight (lbs)  164 lb 10.9 oz 170 lb 3.1 oz 158 lb 11.7 oz  Weight (kg) 74.7 kg 77.2 kg 72 kg      Telemetry    Normal sinus rhythm- Personally Reviewed  ECG     - Personally Reviewed  Physical Exam   GEN: No acute distress.   Neck: No JVD Cardiac: RRR, no murmurs, rubs, or gallops.  Respiratory: Clear to auscultation bilaterally. GI: Soft, nontender, non-distended  MS: No edema; No deformity. Neuro:  Nonfocal  Psych: Normal affect   Labs    High Sensitivity Troponin:   Recent Labs  Lab 08/25/21 1913 08/25/21 2139  TROPONINIHS 7 7     Chemistry Recent Labs  Lab 08/26/21 0448 08/27/21 0606 08/28/21 0804  NA 139 137 135  K 3.9 3.5 3.7  CL 108 103 100  CO2 25 28 29   GLUCOSE 89 89 105*  BUN 23 18 17   CREATININE 0.81 0.75 0.75  CALCIUM 8.4* 8.3* 8.6*  MG 1.7 1.7  --   GFRNONAA >60 >60 >60  ANIONGAP 6 6 6     Lipids No results for input(s): CHOL, TRIG, HDL, LABVLDL, LDLCALC, CHOLHDL in  the last 168 hours.  Hematology Recent Labs  Lab 08/25/21 1913 08/26/21 0448  WBC 3.9* 3.5*  RBC 3.73* 3.64*  HGB 11.9* 11.1*  HCT 35.7* 34.7*  MCV 95.7 95.3  MCH 31.9 30.5  MCHC 33.3 32.0  RDW 14.0 13.9  PLT 124* 119*   Thyroid No results for input(s): TSH, FREET4 in the last 168 hours.  BNP Recent Labs  Lab 08/25/21 1913  BNP 353.9*    DDimer No results for input(s): DDIMER in the last 168 hours.   Radiology    DG Chest 2 View  Result Date: 08/27/2021 CLINICAL DATA:  76 year old female with a history of CHF EXAM: CHEST - 2 VIEW COMPARISON:  08/25/2021 FINDINGS: Cardiomediastinal silhouette unchanged with cardiomegaly. Low lung volumes persist with mixed vague interstitial and airspace opacities at the lung bases. Mild interlobular septal thickening with blunting of the bilateral costophrenic angles, and meniscus on the lateral view in the costophrenic sulcus. Thickening of the fissures. No pneumothorax. No new confluent airspace disease. Surgical changes of the thoracolumbar  region. IMPRESSION: Acute CHF, with bilateral pleural effusions. Overall appears slightly increased from the prior. Electronically Signed   By: Corrie Mckusick D.O.   On: 08/27/2021 08:15   DG Chest Port 1 View  Result Date: 08/27/2021 CLINICAL DATA:  Status post thoracentesis EXAM: PORTABLE CHEST 1 VIEW COMPARISON:  08/27/2021 FINDINGS: There is interval decrease in the left pleural effusion. There is blunting of both lateral CP angles. Transverse diameter of heart is increased. Central pulmonary vessels are prominent. There is no pneumothorax. IMPRESSION: There is interval decrease in the left pleural effusion. There is no pneumothorax. Cardiomegaly. Central pulmonary vessels are prominent suggesting possible mild CHF. Small bilateral pleural effusions. Electronically Signed   By: Elmer Picker M.D.   On: 08/27/2021 12:57   ECHOCARDIOGRAM COMPLETE  Result Date: 08/26/2021    ECHOCARDIOGRAM REPORT   Patient Name:   Kimberly Cook Date of Exam: 08/26/2021 Medical Rec #:  [id number]         Height:       64.0 in Accession #:    [id number]        Weight:       158.7 lb Date of Birth:  [birth year]         BSA:          1.773 m Patient Age:    76 years          BP:           100/88 mmHg Patient Gender: F                 HR:           78 bpm. Exam Location:  [facility] Procedure: 2D Echo, Cardiac Doppler and Color Doppler Indications:     CHF-Acute Diastolic [id number]  History:         Patient has prior history of Echocardiogram examinations.                  Stroke. Palps.  Sonographer:     Alyse Low Roar Referring Phys:  [id number] Loletha Grayer Diagnosing Phys: Ida Rogue MD IMPRESSIONS  1. Left ventricular ejection fraction, by estimation, is 30 to 35%. The left ventricle has moderately decreased function. The left ventricle demonstrates global hypokinesis. The left ventricular internal cavity size was mildly dilated. Left ventricular diastolic parameters are consistent with Grade I diastolic dysfunction (impaired  relaxation).  2. Right ventricular systolic function is normal. The right ventricular  size is normal. Tricuspid regurgitation signal is inadequate for assessing PA pressure.  3. Left atrial size was mild to moderately dilated.  4. Moderate pericardial effusion. The pericardial effusion is circumferential. There is no evidence of cardiac tamponade. Moderate pleural effusion.  5. The mitral valve is normal in structure. Moderate mitral valve regurgitation. No evidence of mitral stenosis.  6. The aortic valve is normal in structure. Aortic valve regurgitation is not visualized. Aortic valve sclerosis is present, with no evidence of aortic valve stenosis. FINDINGS  Left Ventricle: Left ventricular ejection fraction, by estimation, is 30 to 35%. The left ventricle has moderately decreased function. The left ventricle demonstrates global hypokinesis. The left ventricular internal cavity size was mildly dilated. There is no left ventricular hypertrophy. Left ventricular diastolic parameters are consistent with Grade I diastolic dysfunction (impaired relaxation). Right Ventricle: The right ventricular size is normal. No increase in right ventricular wall thickness. Right ventricular systolic function is normal. Tricuspid regurgitation signal is inadequate for assessing PA pressure. Left Atrium: Left atrial size was mild to moderately dilated. Right Atrium: Right atrial size was normal in size. Pericardium: A moderately sized pericardial effusion is present. The pericardial effusion is circumferential. There is diastolic collapse of the right atrial wall. There is no evidence of cardiac tamponade. Mitral Valve: The mitral valve is normal in structure. Moderate mitral valve regurgitation. No evidence of mitral valve stenosis. Tricuspid Valve: The tricuspid valve is normal in structure. Tricuspid valve regurgitation is mild . No evidence of tricuspid stenosis. Aortic Valve: The aortic valve is normal in structure. Aortic valve  regurgitation is not visualized. Aortic valve sclerosis is present, with no evidence of aortic valve stenosis. Aortic valve peak gradient measures 9.9 mmHg. Pulmonic Valve: The pulmonic valve was normal in structure. Pulmonic valve regurgitation is not visualized. No evidence of pulmonic stenosis. Aorta: The aortic root is normal in size and structure. Venous: The inferior vena cava was not well visualized. IAS/Shunts: No atrial level shunt detected by color flow Doppler. Additional Comments: There is a moderate pleural effusion.  LEFT VENTRICLE PLAX 2D LVIDd:         5.10 cm      Diastology LVIDs:         4.40 cm      LV e' medial:    4.90 cm/s LV PW:         0.90 cm      LV E/e' medial:  19.3 LV IVS:        0.80 cm      LV e' lateral:   7.62 cm/s LVOT diam:     1.70 cm      LV E/e' lateral: 12.4 LVOT Area:     2.27 cm  LV Volumes (MOD) LV vol d, MOD A2C: 102.0 ml LV vol d, MOD A4C: 97.8 ml LV vol s, MOD A2C: 71.3 ml LV vol s, MOD A4C: 72.5 ml LV SV MOD A2C:     30.7 ml LV SV MOD A4C:     97.8 ml LV SV MOD BP:      28.7 ml RIGHT VENTRICLE RV Mid diam:    2.80 cm RV S prime:     10.00 cm/s TAPSE (M-mode): 2.0 cm LEFT ATRIUM             Index        RIGHT ATRIUM           Index LA diam:        4.60 cm 2.59  cm/m   RA Area:     15.90 cm LA Vol (A2C):   66.4 ml 37.44 ml/m  RA Volume:   39.10 ml  22.05 ml/m LA Vol (A4C):   67.9 ml 38.29 ml/m LA Biplane Vol: 72.8 ml 41.05 ml/m  AORTIC VALVE                 PULMONIC VALVE AV Area (Vmax): 1.07 cm     PV Vmax:          0.86 m/s AV Vmax:        157.00 cm/s  PV Peak grad:     2.9 mmHg AV Peak Grad:   9.9 mmHg     PR End Diast Vel: 2.57 msec LVOT Vmax:      74.10 cm/s   RVOT Peak grad:   1 mmHg  AORTA Ao Root diam: 2.70 cm Ao Asc diam:  2.80 cm MITRAL VALVE                TRICUSPID VALVE MV Area (PHT): 3.15 cm     TR Peak grad:   18.7 mmHg MV Decel Time: 241 msec     TR Vmax:        216.00 cm/s MV E velocity: 94.70 cm/s MV A velocity: 114.00 cm/s  SHUNTS MV E/A ratio:   0.83         Systemic Diam: 1.70 cm MV A Prime:    10.7 cm/s Ida Rogue MD Electronically signed by Ida Rogue MD Signature Date/Time: 08/26/2021/12:50:20 PM    Final    US THORACENTESIS ASP PLEURAL SPACE W/IMG GUIDE  Result Date: 08/27/2021 INDICATION: Shortness of breath with bilateral pleural effusions request received for diagnostic and therapeutic thoracentesis. EXAM: ULTRASOUND GUIDED LEFT THORACENTESIS MEDICATIONS: Local 1% lidocaine only COMPLICATIONS: None immediate.  No pneumothorax on follow-up chest radiograph. PROCEDURE: An ultrasound guided thoracentesis was thoroughly discussed with the patient and questions answered. The benefits, risks, alternatives and complications were also discussed. The patient understands and wishes to proceed with the procedure. Written consent was obtained. Ultrasound was performed to localize and mark an adequate pocket of fluid in the left chest. The area was then prepped and draped in the normal sterile fashion. 1% Lidocaine was used for local anesthesia. Under ultrasound guidance a 19 gauge, 7-cm, Yueh catheter was introduced. Thoracentesis was performed. The catheter was removed and a dressing applied. FINDINGS: A total of approximately 500 mL of clear yellow fluid was removed. Samples were sent to the laboratory as requested by the clinical team. IMPRESSION: Successful ultrasound guided left thoracentesis yielding 500 mL of pleural fluid. This exam was performed by Tsosie Billing PA-C, and was supervised and interpreted by Dr. Vernard Gambles. Electronically Signed   By: Lucrezia Europe M.D.   On: 08/27/2021 13:45    Cardiac Studies   Echo  1. Left ventricular ejection fraction, by estimation, is 30 to 35%. The  left ventricle has moderately decreased function. The left ventricle  demonstrates global hypokinesis. The left ventricular internal cavity size  was mildly dilated. Left ventricular  diastolic parameters are consistent with Grade I diastolic dysfunction   (impaired relaxation).   2. Right ventricular systolic function is normal. The right ventricular  size is normal. Tricuspid regurgitation signal is inadequate for assessing  PA pressure.   3. Left atrial size was mild to moderately dilated.   4. Moderate pericardial effusion. The pericardial effusion is  circumferential. There is no evidence of cardiac tamponade. Moderate  pleural effusion.  5. The mitral valve is normal in structure. Moderate mitral valve  regurgitation. No evidence of mitral stenosis.   6. The aortic valve is normal in structure. Aortic valve regurgitation is  not visualized. Aortic valve sclerosis is present, with no evidence of  aortic valve stenosis.   Patient Profile     76 y.o. female w/ a h/o moderate nonobstructive CAD by coronary CTA in 04/2017, HTN, HL, and "mini-strokes," who was admitted 1/21 w/ acute CHF and found to have LV dysfunction (EF 30-35%).  Assessment & Plan    Acute systolic CHF In the setting of cardiomyopathy, ejection fraction 30 to 35% this admission Treated with IV Lasix, -4.1 L this admission, reports she is still symptomatic Scheduled for right and left heart catheterization today with Dr. Fletcher Anon Known moderate coronary disease by history --Continue carvedilol, losartan Blood pressure low limiting addition of additional agents at this time -Renal function stable on Lasix 40 IV twice daily  Cardiomyopathy Nonobstructive coronary disease by coronary CTA 2018 Reports worsening shortness of breath, cardiomyopathy this admission Ischemic work-up as above scheduled for later today  Second-degree AV block type I Noted on admission, this appears to have resolved  Moderate bilateral pleural effusions Noted on chest x-ray, confirmed on echocardiogram Underwent left thoracentesis yesterday 500 mils out For continued shortness of breath symptoms may need thoracentesis on right    For questions or updates, please contact [facility] Please consult [email or web address] for contact info under        Signed, Ida Rogue, MD  08/28/2021, 10:14 AM

## 2021-08-28 NOTE — Progress Notes (Signed)
OT Cancellation Note  Patient Details Name: [name] MRN: [id number] DOB: [birth year]   Cancelled Treatment:    Reason Eval/Treat Not Completed: Patient at procedure or test/ unavailable. Pt out of room. Per chart, plan for heart catheterization. Will follow up as appropriate.   Arman Filter., MPH, MS, OTR/L ascom [phone] 08/28/21, 3:26 PM

## 2021-08-28 NOTE — Interval H&P Note (Signed)
History and Physical Interval Note:  08/28/2021 3:22 PM  Kimberly Cook  has presented today for surgery, with the diagnosis of cardiomyopathy.  The various methods of treatment have been discussed with the patient and family. After consideration of risks, benefits and other options for treatment, the patient has consented to  Procedure(s): RIGHT/LEFT HEART CATH AND CORONARY ANGIOGRAPHY (N/A) as a surgical intervention.  The patient's history has been reviewed, patient examined, no change in status, stable for surgery.  I have reviewed the patient's chart and labs.  Questions were answered to the patient's satisfaction.     Lorine Bears

## 2021-08-28 NOTE — Progress Notes (Addendum)
° °  Heart Failure Nurse Navigator Note  Follow-up visit with patient today.  She states that she had read through all the information given to her in the heart failure packet.  She states that she has asked her daughter to remove all the salt food types from her room.  Includes the saltshaker along with 2 new bags of potato chips.  Made her aware that I had found salt free potato chips at [employer] and that would be a possibility for her to snack on.  She states that this time she did not feel that she had any more questions.  She is scheduled for right and left heart catheterization to be performed later this afternoon.  States that she had sat up in the chair for a while this morning without the oxygen and did become short of breath.  At the time of the visit she was lying in bed in no acute distress with the O2 on per nasal cannula.  Stated would continue to follow along as long as she is a inpatient.  She voices understanding.  Tresa Endo RN CHFN

## 2021-08-29 ENCOUNTER — Encounter: Payer: Self-pay | Admitting: Cardiovascular Disease

## 2021-08-29 ENCOUNTER — Telehealth: Payer: Self-pay | Admitting: *Deleted

## 2021-08-29 DIAGNOSIS — G629 Polyneuropathy, unspecified: Secondary | ICD-10-CM

## 2021-08-29 DIAGNOSIS — Z8673 Personal history of transient ischemic attack (TIA), and cerebral infarction without residual deficits: Secondary | ICD-10-CM

## 2021-08-29 DIAGNOSIS — I428 Other cardiomyopathies: Secondary | ICD-10-CM

## 2021-08-29 LAB — BASIC METABOLIC PANEL
Anion gap: 9 (ref 5–15)
BUN: 20 mg/dL (ref 8–23)
CO2: 28 mmol/L (ref 22–32)
Calcium: 8.6 mg/dL — ABNORMAL LOW (ref 8.9–10.3)
Chloride: 97 mmol/L — ABNORMAL LOW (ref 98–111)
Creatinine, Ser: 0.9 mg/dL (ref 0.44–1.00)
GFR, Estimated: >60 mL/min (ref >60)
Glucose, Bld: 84 mg/dL (ref 70–99)
Potassium: 4.3 mmol/L (ref 3.5–5.1)
Sodium: 134 mmol/L — ABNORMAL LOW (ref 135–145)

## 2021-08-29 LAB — CBC
HCT: 36.1 % (ref 36.0–46.0)
Hemoglobin: 11.9 g/dL — ABNORMAL LOW (ref 12.0–15.0)
MCH: 30.8 pg (ref 26.0–34.0)
MCHC: 33 g/dL (ref 30.0–36.0)
MCV: 93.5 fL (ref 80.0–100.0)
Platelets: 123 10*3/uL — ABNORMAL LOW (ref 150–400)
RBC: 3.86 MIL/uL — ABNORMAL LOW (ref 3.87–5.11)
RDW: 13.6 % (ref 11.5–15.5)
WBC: 3.2 10*3/uL — ABNORMAL LOW (ref 4.0–10.5)
nRBC: 0 % (ref 0.0–0.2)

## 2021-08-29 LAB — COMP PANEL: LEUKEMIA/LYMPHOMA

## 2021-08-29 LAB — RESP PANEL BY RT-PCR (FLU A&B, COVID) ARPGX2
Influenza A by PCR: NEGATIVE
Influenza B by PCR: NEGATIVE
SARS Coronavirus 2 by RT PCR: NEGATIVE

## 2021-08-29 MED ORDER — CARVEDILOL 3.125 MG PO TABS
3.1250 mg | ORAL_TABLET | Freq: Two times a day (BID) | ORAL | Status: DC
  Administered 2021-08-29 – 2021-08-30 (×2): 3.125 mg via ORAL
  Filled 2021-08-29 (×2): qty 1

## 2021-08-29 NOTE — Assessment & Plan Note (Signed)
On Lipitor 

## 2021-08-29 NOTE — Assessment & Plan Note (Signed)
Due to CHF.  Status post thoracentesis 08/27/2021 with IR, 500 cc fluid removed from left side.

## 2021-08-29 NOTE — Assessment & Plan Note (Signed)
Currently BP is soft with cardiac medications. --Reduced Coreg to 3.125 mg BID --Also on losartan and oral Lasix

## 2021-08-29 NOTE — Assessment & Plan Note (Signed)
PT/OT recommend SNF for rehab. Plan to return to [facility]. TOC following. Fall precautions.

## 2021-08-29 NOTE — Assessment & Plan Note (Signed)
On ASA, statin 

## 2021-08-29 NOTE — Assessment & Plan Note (Addendum)
S/p surgeries earlier this year. PT/OT recommending return to SNF for ongoing rehab.  Pain control PRN, Lyrica.

## 2021-08-29 NOTE — Telephone Encounter (Signed)
-----   Message from Dalia Heading sent at 08/29/2021  9:12 AM EST ----- Regarding: TOC 2/7 with Nicolasa Ducking, NP

## 2021-08-29 NOTE — Assessment & Plan Note (Addendum)
POA.  Status post IV diuresis.  Now euvolemic. Net IO Since Admission: -4,610 mL [08/29/21 1418] EF 30 to 35%.   BP running soft with GDMT.  --Mgmt per Cardiology --Continue oral Lasix this AM, 40 mg daily --Reduce Coreg to 3.125 mg BID given soft BP's --Reduce losartan to 12.5 mg daily ---Hold Parameters on losartan, Coreg and Lasix - hold if SBP<110 or DBO<60 --Daily weights --Monitor renal function, electrolytes --Follow up with Cardiology as scheduled

## 2021-08-29 NOTE — Progress Notes (Signed)
°  Progress Note   Patient: Kimberly Cook GHW:[id number] DOB: 12/02/1945 DOA: 08/25/2021     3 DOS: the patient was seen and examined on 08/29/2021   Brief hospital course: No notes on file  Assessment and Plan * Acute on chronic systolic CHF (congestive heart failure) (HCC)- (present on admission) POA.  Status post IV diuresis. Net IO Since Admission: -4,610 mL [08/29/21 1418] EF 30 to 35%.   BP running soft.  --Mgmt per Cardiology --Transitioned to oral Lasix this AM, 40 mg daily --Reduce Coreg to 3.125 mg BID given soft BP's --Also on losartan 25 mg daily, may need to reduce --I/O's and daily weights --Monitor renal function, electrolytes  Bilateral pleural effusion- (present on admission) Due to CHF.  Status post thoracentesis 08/27/2021 with IR, 500 cc fluid removed from left side.  Chest pain- (present on admission) Due to CHF decompensation most likely. POA, resolved.   Essential hypertension- (present on admission) Currently BP is soft with cardiac medications. --Reduced Coreg to 3.125 mg BID --Also on losartan and oral Lasix  Hyperlipidemia- (present on admission) On Lipitor  Weakness PT/OT recommend SNF for rehab. Plan to return to [facility]. TOC following. Fall precautions.  Pancytopenia (HCC)- (present on admission) Hep C negative.  Monitor CBC.  Hypothyroidism- (present on admission) On levothyroxine  Chronic back pain- (present on admission) S/p surgeries earlier this year. PT/OT recommending return to SNF for ongoing rehab.  Pain control PRN, Lyrica.  Anxiety and depression- (present on admission) On Lexapro  History of stroke On ASA, statin  Neuropathy On Lyrica     Subjective: Pt awake resting in bed.  Reports feeling overall well.  No chest pain or SOB at rest.  Reports ongoing but stable back pain.  No other acute complaints. She reports ambulating with walker at rehab, doing well.  Objective Vitals notable for soft BP's  101/53, overnight 88/54, this AM 93/60. On 2 L/min [place] O2 intermittently.  General exam: awake, alert, no acute distress HEENT: atraumatic, clear conjunctiva, anicteric sclera, moist mucus membranes, hearing grossly normal  Respiratory system: CTAB diminished bases, no wheezes, rales or rhonchi, normal respiratory effort. Cardiovascular system: normal S1/S2,  RRR,  no pedal edema.   Gastrointestinal system: soft, NT, ND, no HSM felt, +bowel sounds. Central nervous system: A&O x3. no gross focal neurologic deficits, normal speech Extremities: moves all , no edema, normal tone Skin: dry, intact, normal temperature Psychiatry: normal mood, congruent affect, judgement and insight appear normal   Data Reviewed:  Labs reviewed, notable for Na 134, Ca 8.6, WBC 3.2, Hbg 11.2, Plt 123  Family Communication: None present, will attempt to call  Disposition: Status is: Inpatient  Remains inpatient appropriate because: Requires SNF for rehab.  D/c pending clearance by cardiology and SNF bed available.         Time spent: 35 minutes  Author: Pennie Banter, DO 08/29/2021 2:25 PM  For on call review [email or web address].

## 2021-08-29 NOTE — Progress Notes (Signed)
Physical Therapy Treatment Patient Details Name: Kimberly Cook MRN: [id number] DOB: [birth year] Today's Date: 08/29/2021   History of Present Illness Pt admitted for CHF. History includes CAD, HTN, and HLD. Pt complains of SOB symptoms.    PT Comments    Patient tolerated session well and was agreeable to treatment. Patient reported no pain throughout session, and vitals stable and WFL throughout treatment session. Patient completed session on 2L O2 via [place]. Patient continues to require Min A through HHA to complete supine<>sit. From an elevated height patient is able to complete sit to stand CGA with RW. Patient was able to demonstrate increased tolerance with gait, completing 120 feet around the nurses station CGA with RW. Patient is progressing towards her goals and would continue to benefit from skilled physical therapy in order to optimize patient's return to PLOF. Current recommendation remains appropriate (SNF) upon discharge from acute hospitalization.    Recommendations for follow up therapy are one component of a multi-disciplinary discharge planning process, led by the attending physician.  Recommendations may be updated based on patient status, additional functional criteria and insurance authorization.  Follow Up Recommendations  Skilled nursing-short term rehab (<3 hours/day)     Assistance Recommended at Discharge Intermittent Supervision/Assistance  Patient can return home with the following A little help with walking and/or transfers;A little help with bathing/dressing/bathroom   Equipment Recommendations  None recommended by PT    Recommendations for Other Services       Precautions / Restrictions Precautions Precautions: Fall Restrictions Weight Bearing Restrictions: No     Mobility  Bed Mobility Overal bed mobility: Needs Assistance Bed Mobility: Supine to Sit     Supine to sit: Min assist (Through HHA)          Transfers Overall transfer level:  Needs assistance Equipment used: Rolling walker (2 wheels) Transfers: Sit to/from Stand Sit to Stand: Min guard (from elevated bed height)           General transfer comment: x1 failed attempt from lowest setting height on bed; was able to complete CGA with elevated bed height    Ambulation/Gait Ambulation/Gait assistance: Min guard Gait Distance (Feet): 120 Feet Assistive device: Rolling walker (2 wheels) Gait Pattern/deviations: Step-through pattern, Decreased step length - right, Decreased step length - left, Trunk flexed, Narrow base of support Gait velocity: decreased     General Gait Details: no LOB noted, patient relied on RW for balane, was able to perform horizontal head turns and look around while she walked and remained stable   Stairs             Wheelchair Mobility    Modified Rankin (Stroke Patients Only)       Balance Overall balance assessment: Needs assistance Sitting-balance support: Feet supported Sitting balance-Leahy Scale: Good     Standing balance support: Bilateral upper extremity supported Standing balance-Leahy Scale: Fair                              Cognition Arousal/Alertness: Awake/alert Behavior During Therapy: WFL for tasks assessed/performed Overall Cognitive Status: Within Functional Limits for tasks assessed                                          Exercises      General Comments        Pertinent Vitals/Pain Pain  Assessment Pain Assessment: No/denies pain Pain Intervention(s): Limited activity within patient's tolerance, Monitored during session, Repositioned    Home Living                          Prior Function            PT Goals (current goals can now be found in the care plan section) Acute Rehab PT Goals Patient Stated Goal: to go back to SNF PT Goal Formulation: With patient Time For Goal Achievement: 09/10/21 Potential to Achieve Goals: Good Progress  towards PT goals: Progressing toward goals    Frequency    Min 2X/week      PT Plan Current plan remains appropriate    Co-evaluation              AM-PAC PT "6 Clicks" Mobility   Outcome Measure  Help needed turning from your back to your side while in a flat bed without using bedrails?: A Little Help needed moving from lying on your back to sitting on the side of a flat bed without using bedrails?: A Little Help needed moving to and from a bed to a chair (including a wheelchair)?: A Little Help needed standing up from a chair using your arms (e.g., wheelchair or bedside chair)?: A Little Help needed to walk in hospital room?: A Little Help needed climbing 3-5 steps with a railing? : A Little 6 Click Score: 18    End of Session Equipment Utilized During Treatment: Gait belt;Oxygen Activity Tolerance: Patient tolerated treatment well Patient left: in bed;with call bell/phone within reach;with bed alarm set Nurse Communication: Mobility status PT Visit Diagnosis: Unsteadiness on feet (R26.81);Muscle weakness (generalized) (M62.81);Difficulty in walking, not elsewhere classified (R26.2)     Time: 0102-7253 PT Time Calculation (min) (ACUTE ONLY): 22 min  Charges:  $Gait Training: 8-22 mins                     Angelica Ran, PT  08/29/21. 3:29 PM

## 2021-08-29 NOTE — Care Management Important Message (Signed)
Important Message  Patient Details  Name: Kimberly Cook MRN: [id number] Date of Birth: July 29, 1946   Medicare Important Message Given:  Yes     [name] 08/29/2021, 11:47 AM

## 2021-08-29 NOTE — Assessment & Plan Note (Signed)
Hep C negative.  Monitor CBC.

## 2021-08-29 NOTE — Assessment & Plan Note (Signed)
On Lexapro 

## 2021-08-29 NOTE — Telephone Encounter (Signed)
Pt currently admitted.  Our office will contact pt for TOC post discharge.  Pt scheduled with Ignacia Bayley, NP 09/11/21.

## 2021-08-29 NOTE — Assessment & Plan Note (Signed)
On Lyrica

## 2021-08-29 NOTE — TOC Progression Note (Signed)
Transition of Care [facility]) - Progression Note    Patient Details  Name: ERENDIDA WRENN MRN: [id number] Date of Birth: 05-15-1946  Transition of Care [facility] CM/SW Contact  Maree Krabbe, LCSW Phone Number: 08/29/2021, 2:50 PM  Clinical Narrative: Pt from [facility]. CSW has reached out to SNF to start that auth.      Expected Discharge Plan: Skilled Nursing Facility Barriers to Discharge: Continued Medical Work up  Expected Discharge Plan and Services Expected Discharge Plan: Skilled Nursing Facility       Living arrangements for the past 2 months: Skilled Nursing Facility [facility])                                       Social Determinants of Health (SDOH) Interventions    Readmission Risk Interventions No flowsheet data found.

## 2021-08-29 NOTE — Progress Notes (Addendum)
Progress Note  Patient Name: Kimberly Cook Date of Encounter: 08/29/2021  Primary Cardiologist: Nelva Bush, MD  Subjective   No chest pain or sob.  Breathing much improved.  Chronic back pain.  Inpatient Medications    Scheduled Meds:  aspirin EC  81 mg Oral QHS   atorvastatin  20 mg Oral Daily   carvedilol  6.25 mg Oral BID WC   cholecalciferol  5,000 Units Oral Daily   enoxaparin (LOVENOX) injection  40 mg Subcutaneous Q24H   escitalopram  5 mg Oral [id number]   folic acid  0.5 mg Oral Daily   furosemide  40 mg Oral Daily   levothyroxine  88 mcg Oral QAC breakfast   loratadine  10 mg Oral Daily   losartan  25 mg Oral Daily   magnesium oxide  400 mg Oral Daily   melatonin  5 mg Oral QHS   mirabegron ER  50 mg Oral Daily   montelukast  10 mg Oral QHS   pantoprazole  40 mg Oral Daily   potassium chloride  20 mEq Oral BID   pregabalin  75 mg Oral TID   sodium chloride flush  3 mL Intravenous Q12H   sodium chloride flush  3 mL Intravenous Q12H   traZODone  50 mg Oral QHS   vitamin B-12  1,000 mcg Oral Daily   Continuous Infusions:  sodium chloride     PRN Meds: sodium chloride, acetaminophen **OR** acetaminophen, ALPRAZolam, lidocaine, ondansetron **OR** ondansetron (ZOFRAN) IV, polyethylene glycol, sodium chloride flush   Vital Signs    Vitals:   08/29/21 0406 08/29/21 0808 08/29/21 0907 08/29/21 1011  BP: 108/63 99/66 93/60    Pulse: 70 67 70   Resp: 20 18    Temp: 98.2 F (36.8 C) 98 F (36.7 C)    TempSrc: Oral     SpO2: 97% 97%    Weight: 77.3 kg   74.7 kg  Height:        Intake/Output Summary (Last 24 hours) at 08/29/2021 1046 Last data filed at 08/29/2021 1010 Gross per 24 hour  Intake 360 ml  Output 900 ml  Net -540 ml   Filed Weights   08/28/21 0600 08/29/21 0406 08/29/21 1011  Weight: 74.7 kg 77.3 kg 74.7 kg    Physical Exam   GEN: Well nourished, well developed, in no acute distress.  HEENT: Grossly normal.  Neck: Supple, no JVD,  carotid bruits, or masses. Cardiac: RRR, no murmurs, rubs, or gallops. No clubbing, cyanosis, edema.  Radials 2+, DP/PT 2+ and equal bilaterally.  R radial/brachial cath sites w/o bleeding/bruit/hematoma. Respiratory:  Respirations regular and unlabored, diminished @ R base. GI: Soft, nontender, nondistended, BS + x 4. MS: no deformity or atrophy. Skin: warm and dry, no rash. Neuro:  Strength and sensation are intact. Psych: AAOx3.  Normal affect.  Labs    Chemistry Recent Labs  Lab 08/27/21 0606 08/28/21 0804 08/29/21 0420  NA 137 135 134*  K 3.5 3.7 4.3  CL 103 100 97*  CO2 28 29 28   GLUCOSE 89 105* 84  BUN 18 17 20   CREATININE 0.75 0.75 0.90  CALCIUM 8.3* 8.6* 8.6*  GFRNONAA >60 >60 >60  ANIONGAP 6 6 9      Hematology Recent Labs  Lab 08/25/21 1913 08/26/21 0448 08/29/21 0420  WBC 3.9* 3.5* 3.2*  RBC 3.73* 3.64* 3.86*  HGB 11.9* 11.1* 11.9*  HCT 35.7* 34.7* 36.1  MCV 95.7 95.3 93.5  MCH 31.9 30.5 30.8  MCHC  33.3 32.0 33.0  RDW 14.0 13.9 13.6  PLT 124* 119* 123*    Cardiac Enzymes  Recent Labs  Lab 08/25/21 1913 08/25/21 2139  TROPONINIHS 7 7      BNP Recent Labs  Lab 08/25/21 1913  BNP 353.9*    Lipids  Lab Results  Component Value Date   CHOL 125 11/24/2019   HDL 46.40 11/24/2019   LDLCALC 47 11/24/2019   TRIG 158.0 (H) 11/24/2019   CHOLHDL 3 11/24/2019    HbA1c  Lab Results  Component Value Date   HGBA1C 5.1 11/24/2019    Radiology    DG Chest 2 View  Result Date: 08/27/2021 CLINICAL DATA:  76 year old female with a history of CHF EXAM: CHEST - 2 VIEW COMPARISON:  08/25/2021 FINDINGS: Cardiomediastinal silhouette unchanged with cardiomegaly. Low lung volumes persist with mixed vague interstitial and airspace opacities at the lung bases. Mild interlobular septal thickening with blunting of the bilateral costophrenic angles, and meniscus on the lateral view in the costophrenic sulcus. Thickening of the fissures. No pneumothorax. No new  confluent airspace disease. Surgical changes of the thoracolumbar region. IMPRESSION: Acute CHF, with bilateral pleural effusions. Overall appears slightly increased from the prior. Electronically Signed   By: Corrie Mckusick D.O.   On: 08/27/2021 08:15   DG Chest 2 View  Result Date: 08/25/2021 CLINICAL DATA:  Dyspnea EXAM: CHEST - 2 VIEW COMPARISON:  03/31/2021 FINDINGS: Moderate bilateral pleural effusions are present with resultant mild retrocardiac opacification. No superimposed focal pulmonary infiltrate. No pneumothorax. Mild cardiomegaly is present, accentuated by seated positioning. Pulmonary vascularity is normal. No acute bone abnormality. Extensive thoracolumbar fusion instrumentation again noted. IMPRESSION: Moderate bilateral pleural effusions. Mild cardiomegaly. Electronically Signed   By: Fidela Salisbury M.D.   On: 08/25/2021 19:56   DG Chest Port 1 View  Result Date: 08/27/2021 CLINICAL DATA:  Status post thoracentesis EXAM: PORTABLE CHEST 1 VIEW COMPARISON:  08/27/2021 FINDINGS: There is interval decrease in the left pleural effusion. There is blunting of both lateral CP angles. Transverse diameter of heart is increased. Central pulmonary vessels are prominent. There is no pneumothorax. IMPRESSION: There is interval decrease in the left pleural effusion. There is no pneumothorax. Cardiomegaly. Central pulmonary vessels are prominent suggesting possible mild CHF. Small bilateral pleural effusions. Electronically Signed   By: Elmer Picker M.D.   On: 08/27/2021 12:57   US THORACENTESIS ASP PLEURAL SPACE W/IMG GUIDE  Result Date: 08/27/2021 INDICATION: Shortness of breath with bilateral pleural effusions request received for diagnostic and therapeutic thoracentesis. EXAM: ULTRASOUND GUIDED LEFT THORACENTESIS MEDICATIONS: Local 1% lidocaine only COMPLICATIONS: None immediate.  No pneumothorax on follow-up chest radiograph. PROCEDURE: An ultrasound guided thoracentesis was thoroughly  discussed with the patient and questions answered. The benefits, risks, alternatives and complications were also discussed. The patient understands and wishes to proceed with the procedure. Written consent was obtained. Ultrasound was performed to localize and mark an adequate pocket of fluid in the left chest. The area was then prepped and draped in the normal sterile fashion. 1% Lidocaine was used for local anesthesia. Under ultrasound guidance a 19 gauge, 7-cm, Yueh catheter was introduced. Thoracentesis was performed. The catheter was removed and a dressing applied. FINDINGS: A total of approximately 500 mL of clear yellow fluid was removed. Samples were sent to the laboratory as requested by the clinical team. IMPRESSION: Successful ultrasound guided left thoracentesis yielding 500 mL of pleural fluid. This exam was performed by Tsosie Billing PA-C, and was supervised and interpreted by Dr. Vernard Gambles.  Electronically Signed   By: Lucrezia Europe M.D.   On: 08/27/2021 13:45    Telemetry    RSR, frequent PACs w/ brief runs of atrial tachycardia and intermittent mobitz I - Personally Reviewed  Cardiac Studies   2D Echocardiogram 01.22.2023   1. Left ventricular ejection fraction, by estimation, is 30 to 35%. The  left ventricle has moderately decreased function. The left ventricle  demonstrates global hypokinesis. The left ventricular internal cavity size  was mildly dilated. Left ventricular  diastolic parameters are consistent with Grade I diastolic dysfunction  (impaired relaxation).   2. Right ventricular systolic function is normal. The right ventricular  size is normal. Tricuspid regurgitation signal is inadequate for assessing  PA pressure.   3. Left atrial size was mild to moderately dilated.   4. Moderate pericardial effusion. The pericardial effusion is  circumferential. There is no evidence of cardiac tamponade. Moderate  pleural effusion.   5. The mitral valve is normal in structure.  Moderate mitral valve  regurgitation. No evidence of mitral stenosis.   6. The aortic valve is normal in structure. Aortic valve regurgitation is  not visualized. Aortic valve sclerosis is present, with no evidence of  aortic valve stenosis.  _____________  Cardiac Catheterization  1.24.2023  Left Main  Vessel is angiographically normal.  Left Anterior Descending  The vessel exhibits minimal luminal irregularities.  First Diagonal Branch  Vessel is angiographically normal.  Second Diagonal Branch  Vessel is angiographically normal.  Third Diagonal Branch  Vessel is small in size. Vessel is angiographically normal.  Left Circumflex  Vessel is angiographically normal.  Second Obtuse Marginal Branch  Vessel is large in size.  Third Obtuse Marginal Branch  Vessel is angiographically normal.  Right Coronary Artery  Prox RCA lesion is 20% stenosed. The lesion is mildly calcified.  Right Posterior Descending Artery  There is mild disease in the vessel.  Right Posterior Atrioventricular Artery  Vessel is angiographically normal.   RA 15/12 RV 37/10 (14) PA 36/19 (27) PCWP 19/17 (16) CO 4.26 L/min CI 2.36 L/min/m^2 _____________  Patient Profile     76 y.o. female w/ a h/o moderate nonobstructive CAD by coronary CTA in 04/2017, HTN, HL, and "mini-strokes," who was admitted 1/21 w/ acute CHF and found to have LV dysfunction (EF 30-35%).  Cath 1/24 w/ mildly elevated filling pressures and nonobs CAD.  Assessment & Plan    1.  Acute systolic CHF/NICM:  Admitted 1/21 w/ progressive dyspnea/edema and found to have EF of 30-35% on echo.  R & L heart cath 1/24 w/ nonobs CAD and mildly elevated filling pressures.  Transitioned to PO lasix this AM.  Minus 200 ml yesterday and minus 4.8 since admission. Standing wt this AM 74.7kg.  Appears euvolemic on exam.  Cont PO lasix - 40mg  daily.  Cont ? blocker and ARB.  BP relatively soft, so no opportunity to transition to entresto or add MRA at  this point, but will readdress as outpt.  Reasonable to add SGLT2i, if not cost-prohibitive.  We will arrange for f/u in the office in 7-14 days (has CHF clinic appt next wek).  2.  Nonobs CAD:  s/p cath 1/24 w/o significant dzs.  Cont asa/statin.  3.  Essential HTN:  stable/soft.  4.  Frequent PACs/PAT:  Pt w/ freq PACs and brief runs of atrial tachycardia.  Asymptomatic.  No sustained arrhythmias.  Cont ? blocker.    5.  Mobitz I:  intermittent and typically seen coming out of  an atrial run.  No high grade heart block noted.  6.  Moderate Pleural Effusions:  s/p thoracentesis on L on 1/23  553ml.  Breathing overall, much improved.  7.  H/o Pancytopenia:  stable.  Signed, Murray Hodgkins, NP  08/29/2021, 10:46 AM    For questions or updates, please contact   Please consult [email or web address] for contact info under Cardiology/STEMI.

## 2021-08-29 NOTE — Assessment & Plan Note (Signed)
Due to CHF decompensation most likely. POA, resolved.

## 2021-08-29 NOTE — Assessment & Plan Note (Signed)
On levothyroxine  ?

## 2021-08-30 DIAGNOSIS — E782 Mixed hyperlipidemia: Secondary | ICD-10-CM

## 2021-08-30 DIAGNOSIS — R531 Weakness: Secondary | ICD-10-CM

## 2021-08-30 DIAGNOSIS — Z9889 Other specified postprocedural states: Secondary | ICD-10-CM

## 2021-08-30 DIAGNOSIS — I5043 Acute on chronic combined systolic (congestive) and diastolic (congestive) heart failure: Secondary | ICD-10-CM

## 2021-08-30 LAB — BASIC METABOLIC PANEL
Anion gap: 5 (ref 5–15)
BUN: 22 mg/dL (ref 8–23)
CO2: 32 mmol/L (ref 22–32)
Calcium: 9.1 mg/dL (ref 8.9–10.3)
Chloride: 100 mmol/L (ref 98–111)
Creatinine, Ser: 0.82 mg/dL (ref 0.44–1.00)
GFR, Estimated: >60 mL/min (ref >60)
Glucose, Bld: 96 mg/dL (ref 70–99)
Potassium: 4.3 mmol/L (ref 3.5–5.1)
Sodium: 137 mmol/L (ref 135–145)

## 2021-08-30 LAB — CBC
HCT: 34.8 % — ABNORMAL LOW (ref 36.0–46.0)
Hemoglobin: 11.6 g/dL — ABNORMAL LOW (ref 12.0–15.0)
MCH: 31.3 pg (ref 26.0–34.0)
MCHC: 33.3 g/dL (ref 30.0–36.0)
MCV: 93.8 fL (ref 80.0–100.0)
Platelets: 118 10*3/uL — ABNORMAL LOW (ref 150–400)
RBC: 3.71 MIL/uL — ABNORMAL LOW (ref 3.87–5.11)
RDW: 13.5 % (ref 11.5–15.5)
WBC: 3.4 10*3/uL — ABNORMAL LOW (ref 4.0–10.5)
nRBC: 0 % (ref 0.0–0.2)

## 2021-08-30 LAB — MAGNESIUM: Magnesium: 1.8 mg/dL (ref 1.7–2.4)

## 2021-08-30 MED ORDER — MAGNESIUM OXIDE -MG SUPPLEMENT 400 (240 MG) MG PO TABS: 400.0000 mg | ORAL_TABLET | Freq: Every day | ORAL | Status: DC

## 2021-08-30 MED ORDER — LOSARTAN POTASSIUM 25 MG PO TABS: 12.5000 mg | ORAL_TABLET | Freq: Every day | ORAL | Status: DC

## 2021-08-30 MED ORDER — ALPRAZOLAM 0.5 MG PO TABS: 0.5000 mg | ORAL_TABLET | Freq: Two times a day (BID) | ORAL | 0 refills | Status: DC | PRN

## 2021-08-30 MED ORDER — POTASSIUM CHLORIDE CRYS ER 20 MEQ PO TBCR: 20.0000 meq | EXTENDED_RELEASE_TABLET | Freq: Two times a day (BID) | ORAL | Status: DC

## 2021-08-30 MED ORDER — CARVEDILOL 3.125 MG PO TABS: 3.1250 mg | ORAL_TABLET | Freq: Two times a day (BID) | ORAL | Status: DC

## 2021-08-30 MED ORDER — FUROSEMIDE 40 MG PO TABS: 40.0000 mg | ORAL_TABLET | Freq: Every day | ORAL | Status: DC

## 2021-08-30 NOTE — Progress Notes (Signed)
Occupational Therapy Treatment Patient Details Name: Kimberly Cook MRN: [id number] DOB: 1946-02-13 Today's Date: 08/30/2021   History of present illness Pt admitted for CHF. History includes CAD, HTN, and HLD. Pt complains of SOB symptoms.   OT comments  Pt seen for OT tx this date. Pt received in recliner, agreeable to session, denies complaints. Pt instructed in use of reacher for LB dressing. Pt completed with PRN VC after initial instruction in use of reacher to assist in threading undergarments over feet, CGA in standing to complete donning over hips without UE support. Pt demo'd good problem solving while manipulating garment to improve technique. Pt continues to benefit from skilled OT services. Continue to recommend SNF at this time.    Recommendations for follow up therapy are one component of a multi-disciplinary discharge planning process, led by the attending physician.  Recommendations may be updated based on patient status, additional functional criteria and insurance authorization.    Follow Up Recommendations  Skilled nursing-short term rehab (<3 hours/day)    Assistance Recommended at Discharge Intermittent Supervision/Assistance  Patient can return home with the following  A little help with walking and/or transfers;A little help with bathing/dressing/bathroom;Assistance with cooking/housework;Assist for transportation;Help with stairs or ramp for entrance;Direct supervision/assist for medications management   Equipment Recommendations  Other (comment) (standard length reacher)    Recommendations for Other Services      Precautions / Restrictions Precautions Precautions: Fall Restrictions Weight Bearing Restrictions: No       Mobility Bed Mobility               General bed mobility comments: NT, up in recliner at start and end of session    Transfers Overall transfer level: Needs assistance Equipment used: Rolling walker (2 wheels) Transfers: Sit  to/from Stand Sit to Stand: Min guard, Supervision           General transfer comment: SBA from recliner + RW     Balance Overall balance assessment: Needs assistance Sitting-balance support: Feet supported Sitting balance-Leahy Scale: Good     Standing balance support: No upper extremity supported, During functional activity Standing balance-Leahy Scale: Fair                             ADL either performed or assessed with clinical judgement   ADL Overall ADL's : Needs assistance/impaired                     Lower Body Dressing: Sit to/from stand;Min guard;Set up;Supervision/safety;With adaptive equipment Lower Body Dressing Details (indicate cue type and reason): PRN VC after initial instruction in use of reacher to assist in threading undergarments over feet, CGA in standing to complete donning over hips without UE support                    Extremity/Trunk Assessment              Vision       Perception     Praxis      Cognition Arousal/Alertness: Awake/alert Behavior During Therapy: WFL for tasks assessed/performed Overall Cognitive Status: Within Functional Limits for tasks assessed                                          Exercises Other Exercises Other Exercises: Pt educated in AE for LB dressing  to improve her independence; pt able to teach back proper technique    Shoulder Instructions       General Comments      Pertinent Vitals/ Pain       Pain Assessment Pain Assessment: No/denies pain  Home Living                                          Prior Functioning/Environment              Frequency  Min 2X/week        Progress Toward Goals  OT Goals(current goals can now be found in the care plan section)  Progress towards OT goals: Progressing toward goals  Acute Rehab OT Goals Patient Stated Goal: go to rehab to get stronger OT Goal Formulation: With  patient Time For Goal Achievement: 09/10/21 Potential to Achieve Goals: Good  Plan Discharge plan remains appropriate;Frequency remains appropriate    Co-evaluation                 AM-PAC OT "6 Clicks" Daily Activity     Outcome Measure   Help from another person eating meals?: None Help from another person taking care of personal grooming?: None Help from another person toileting, which includes using toliet, bedpan, or urinal?: A Little Help from another person bathing (including washing, rinsing, drying)?: A Lot Help from another person to put on and taking off regular upper body clothing?: None Help from another person to put on and taking off regular lower body clothing?: A Little 6 Click Score: 20    End of Session    OT Visit Diagnosis: Other abnormalities of gait and mobility (R26.89);Muscle weakness (generalized) (M62.81)   Activity Tolerance Patient tolerated treatment well   Patient Left in chair;with call bell/phone within reach;with chair alarm set   Nurse Communication          Time: [phone] OT Time Calculation (min): 10 min  Charges: OT General Charges $OT Visit: 1 Visit OT Treatments $Self Care/Home Management : 8-22 mins  Arman Filter., MPH, MS, OTR/L ascom [phone] 08/30/21, 9:55 AM

## 2021-08-30 NOTE — Telephone Encounter (Signed)
Patient returning call.

## 2021-08-30 NOTE — Progress Notes (Signed)
Discharge report called to [facility] iv and tele removed/ EMS to transport

## 2021-08-30 NOTE — TOC Progression Note (Signed)
Transition of Care [facility]) - Progression Note    Patient Details  Name: Kimberly Cook MRN: [id number] Date of Birth: 21-Feb-1946  Transition of Care [facility]) CM/SW [place], LCSW Phone Number: 08/30/2021, 10:03 AM  Clinical Narrative:   Per [facility], pt has exhausted her [employer] days and can return under long term care. SNF prepared to take pt today if medically stable. MD notified.    Expected Discharge Plan: [place] Barriers to Discharge: Continued Medical Work up  Expected Discharge Plan and Services Expected Discharge Plan: [place] arrangements for the past 2 months: [facility]                                       Social Determinants of Health (SDOH) Interventions    Readmission Risk Interventions No flowsheet data found.

## 2021-08-30 NOTE — Progress Notes (Signed)
Progress Note  Patient Name: Kimberly Cook Date of Encounter: 08/30/2021  [facility] Cardiologist: Nelva Bush, MD   Subjective   UOP -3.2L. She reports breathing is stable. No chest pain. Cath site is stable.   Inpatient Medications    Scheduled Meds:  aspirin EC  81 mg Oral QHS   atorvastatin  20 mg Oral Daily   carvedilol  3.125 mg Oral BID WC   cholecalciferol  5,000 Units Oral Daily   enoxaparin (LOVENOX) injection  40 mg Subcutaneous Q24H   escitalopram  5 mg Oral [id number]   folic acid  0.5 mg Oral Daily   furosemide  40 mg Oral Daily   levothyroxine  88 mcg Oral QAC breakfast   loratadine  10 mg Oral Daily   losartan  25 mg Oral Daily   magnesium oxide  400 mg Oral Daily   melatonin  5 mg Oral QHS   mirabegron ER  50 mg Oral Daily   montelukast  10 mg Oral QHS   pantoprazole  40 mg Oral Daily   potassium chloride  20 mEq Oral BID   pregabalin  75 mg Oral TID   sodium chloride flush  3 mL Intravenous Q12H   sodium chloride flush  3 mL Intravenous Q12H   traZODone  50 mg Oral QHS   vitamin B-12  1,000 mcg Oral Daily   Continuous Infusions:  sodium chloride     PRN Meds: sodium chloride, acetaminophen **OR** acetaminophen, ALPRAZolam, lidocaine, ondansetron **OR** ondansetron (ZOFRAN) IV, polyethylene glycol, sodium chloride flush   Vital Signs    Vitals:   08/29/21 1604 08/29/21 1907 08/30/21 0057 08/30/21 0717  BP: 103/69 94/61 104/72 106/64  Pulse: 67 68 77 67  Resp: 18 16 18 17   Temp: 98.1 F (36.7 C) 98 F (36.7 C) 98.2 F (36.8 C) 97.7 F (36.5 C)  TempSrc: Oral Oral    SpO2: 100% 96% 97% 97%  Weight:      Height:        Intake/Output Summary (Last 24 hours) at 08/30/2021 0821 Last data filed at 08/30/2021 [id number] Gross per 24 hour  Intake 1080 ml  Output 3900 ml  Net -2820 ml   Last 3 Weights 08/29/2021 08/29/2021 08/28/2021  Weight (lbs) 164 lb 10.9 oz 170 lb 6.7 oz 164 lb 10.9 oz  Weight (kg) 74.7 kg 77.3 kg 74.7 kg       Telemetry    NSR,HR 80s, PACs, intermittent atach (brief runs),  - Personally Reviewed  ECG    No new - Personally Reviewed  Physical Exam   GEN: No acute distress.   Neck: No JVD Cardiac: RRR, no murmurs, rubs, or gallops.  Respiratory: Clear to auscultation bilaterally. GI: Soft, nontender, non-distended  MS: No edema; No deformity. Neuro:  Nonfocal  Psych: Normal affect   Labs    High Sensitivity Troponin:   Recent Labs  Lab 08/25/21 1913 08/25/21 2139  TROPONINIHS 7 7     Chemistry Recent Labs  Lab 08/26/21 0448 08/27/21 0606 08/28/21 0804 08/29/21 0420 08/30/21 0628  NA 139 137 135 134* 137  K 3.9 3.5 3.7 4.3 4.3  CL 108 103 100 97* 100  CO2 25 28 29 28  32  GLUCOSE 89 89 105* 84 96  BUN 23 18 17 20 22   CREATININE 0.81 0.75 0.75 0.90 0.82  CALCIUM 8.4* 8.3* 8.6* 8.6* 9.1  MG 1.7 1.7  --   --  1.8  GFRNONAA >60 >60 >60 >60 >  60  ANIONGAP 6 6 6 9 5     Lipids No results for input(s): CHOL, TRIG, HDL, LABVLDL, LDLCALC, CHOLHDL in the last 168 hours.  Hematology Recent Labs  Lab 08/26/21 0448 08/29/21 0420 08/30/21 0628  WBC 3.5* 3.2* 3.4*  RBC 3.64* 3.86* 3.71*  HGB 11.1* 11.9* 11.6*  HCT 34.7* 36.1 34.8*  MCV 95.3 93.5 93.8  MCH 30.5 30.8 31.3  MCHC 32.0 33.0 33.3  RDW 13.9 13.6 13.5  PLT 119* 123* 118*   Thyroid No results for input(s): TSH, FREET4 in the last 168 hours.  BNP Recent Labs  Lab 08/25/21 1913  BNP 353.9*    DDimer No results for input(s): DDIMER in the last 168 hours.   Radiology    CARDIAC CATHETERIZATION  Result Date: 08/28/2021   Prox RCA lesion is 20% stenosed. 1.  Mild nonobstructive coronary artery disease. 2.  Left ventricular angiography was not performed.  EF was 30 to 35% by echo. 3.  Right heart catheterization showed mildly elevated right and left filling pressures, mild pulmonary hypertension and mildly reduced cardiac output. Recommendations: Recommend medical therapy for nonischemic cardiomyopathy. The  patient continues to be mildly volume overloaded.  We will continue IV furosemide tonight and switch to oral furosemide tomorrow morning.    Cardiac Studies   2D Echocardiogram 01.22.2023    1. Left ventricular ejection fraction, by estimation, is 30 to 35%. The  left ventricle has moderately decreased function. The left ventricle  demonstrates global hypokinesis. The left ventricular internal cavity size  was mildly dilated. Left ventricular  diastolic parameters are consistent with Grade I diastolic dysfunction  (impaired relaxation).   2. Right ventricular systolic function is normal. The right ventricular  size is normal. Tricuspid regurgitation signal is inadequate for assessing  PA pressure.   3. Left atrial size was mild to moderately dilated.   4. Moderate pericardial effusion. The pericardial effusion is  circumferential. There is no evidence of cardiac tamponade. Moderate  pleural effusion.   5. The mitral valve is normal in structure. Moderate mitral valve  regurgitation. No evidence of mitral stenosis.   6. The aortic valve is normal in structure. Aortic valve regurgitation is  not visualized. Aortic valve sclerosis is present, with no evidence of  aortic valve stenosis.  _____________  Cardiac Catheterization  1.24.2023           Left Main       Vessel is angiographically normal.       Left Anterior Descending    The vessel exhibits minimal luminal irregularities.    First Diagonal Branch       Vessel is angiographically normal.       Second Diagonal Branch       Vessel is angiographically normal.       Third Diagonal Branch   Vessel is small in size. Vessel is angiographically normal.   Left Circumflex       Vessel is angiographically normal.       Second Obtuse Marginal Branch        Vessel is large in size.        Third Obtuse Marginal Branch       Vessel is angiographically normal.       Right Coronary Artery  Prox RCA lesion is 20% stenosed. The lesion  is mildly calcified.  Right Posterior Descending Artery      There is mild disease in the vessel.      Right Posterior Atrioventricular Artery  Vessel is angiographically normal.       RA 15/12 RV 37/10 (14) PA 36/19 (27) PCWP 19/17 (16) CO 4.26 L/min CI 2.36 L/min/m^2 _____________  Patient Profile     76 y.o. female w/ a h/o moderate nonobstructive CAD by coronary CTA in 04/2017, HTN, HL, and "mini-strokes," who was admitted 1/21 w/ acute CHF and found to have LV dysfunction (EF 30-35%).  Cath 1/24 w/ mildly elevated filling pressures and nonobs CAD.  Assessment & Plan    Acute systolic CHF/NICM - admitted 1/21 with progressive dyspnea/edema found to have EF 30-35% on echo.  - R/L heart cath showed nonobstructive CAD and mildly elevated filling pressures.  - she is on PO lasix 40mg  daily - euvolemic on exam - continue Coreg and Losartan - continue GDMT as able as OP, BP soft and may limit this - F/u scheduled  Nonobstructive CAD - LHC showed nonobstructive CAD - no anginal symptoms reported - continue Aspirin, statin, BB  HTN - soft  Frequent PACs/PAT - frequent PACs and brief atrial tachycardia - patient is asymptomatic - continue BB>>unable to titrate with soft pressures  Mobitz 1 - intermittent, no high grade heart block noted - continue BB  Moderate pleural effusion  - s/p thoracentesis on 1/23 - breathing improved   For questions or updates, please contact [facility] Please consult [email or web address] for contact info under        Signed, Kortnie Stovall Ninfa Meeker, PA-C  08/30/2021, 8:21 AM

## 2021-08-30 NOTE — Telephone Encounter (Signed)
Transition Care Management Unsuccessful Follow-up Telephone Call  Date of discharge and from where:  1/26 at 12:39, [facility]  Attempts:  1st Attempt  Reason for unsuccessful TCM follow-up call:  Left voice message

## 2021-08-30 NOTE — Telephone Encounter (Signed)
Transition Care Management Unsuccessful Follow-up Telephone Call  Date of discharge and from where:  [facility] 1/26 at 12:39   Attempts:  2nd Attempt  Reason for unsuccessful TCM follow-up call:  Left voice message   Appears pt was discharge to [facility] with EMS transport to facility  Pt not qualified/required for TCM call.   F/u appt with Ward Givens, NP 09/11/21.

## 2021-08-30 NOTE — Discharge Summary (Addendum)
Physician Discharge Summary   Patient: Kimberly Cook MRN: [id number] DOB: 1946-03-07  Admit date:     08/25/2021  Discharge date: 08/30/21  Discharge Physician: Ezekiel Slocumb   PCP: Virgie Dad, FNP   Recommendations at discharge:    Follow up with Primary Care in 1 week Check CBC/CMP/Mg in 1 week Follow up with cardiology as scheduled Follow up on Blood Pressure, running soft with cardiac medications for CHF.  Hold parameters placed on medications to prevent hypotension  DAILY WEIGHTS Patient requires a recliner chair in her room Place TED hose daily when patient is up and awake  Discharge Diagnoses Principal Problem:   Acute on chronic HFrEF (heart failure with reduced ejection fraction) ([place]) Active Problems:   Bilateral pleural effusion   Essential hypertension   Chest pain   Hyperlipidemia   Weakness   Pancytopenia (HCC)   Hypothyroidism   Chronic back pain   Anxiety and depression   History of stroke   Neuropathy   NICM (nonischemic cardiomyopathy) ([place])  Resolved Problems:   Acute CHF (congestive heart failure) (HCC)   CHF exacerbation [facility])   Hospital Course   76 year old female that has been in [facility] since her back surgery 6 months ago, previous history of stroke, GERD, essential hypertension, CAD, chronic back pain, hypothyroidism, depression.  She presented with shortness of breath and weight gain.  Diagnosed with acute on chronic systolic congestive heart failure.  Patient had left-sided thoracentesis on 08/27/2021 removing 500 mL of fluid underneath the left lung.  Patient will be brought for cardiac catheterization on 08/28/2021 which showed nonobstructive coronary disease.     * Acute on chronic HFrEF (heart failure with reduced ejection fraction) ([place]) POA.  Status post IV diuresis.  Now euvolemic. Net IO Since Admission: -4,610 mL [08/29/21 1418] EF 30 to 35%.   BP running soft with GDMT.  --Mgmt per Cardiology --Continue oral  Lasix this AM, 40 mg daily --Reduce Coreg to 3.125 mg BID given soft BP's --Reduce losartan to 12.5 mg daily ---Hold Parameters on losartan, Coreg and Lasix - hold if SBP<110 or DBO<60 --Daily weights --Monitor renal function, electrolytes --Follow up with Cardiology as scheduled  Bilateral pleural effusion- (present on admission) Due to CHF.  Status post thoracentesis 08/27/2021 with IR, 500 cc fluid removed from left side.  Chest pain- (present on admission) Due to CHF decompensation most likely. POA, resolved.   Essential hypertension- (present on admission) Currently BP is soft with cardiac medications. --Reduced Coreg to 3.125 mg BID --Also on losartan and oral Lasix  Hyperlipidemia- (present on admission) On Lipitor  Weakness PT/OT recommend SNF for rehab. Plan to [facility]. TOC following. Fall precautions.  Pancytopenia ([place])- (present on admission) Hep C negative.  Monitor CBC.  Hypothyroidism- (present on admission) On levothyroxine  Chronic back pain- (present on admission) S/p surgeries earlier this year. PT/OT recommending return to SNF for ongoing rehab.  Pain control PRN, Lyrica.  Anxiety and depression- (present on admission) On Lexapro  History of stroke On ASA, statin  Neuropathy On Lyrica        Consultants: Cardiology Procedures performed: Cardiac Cath, Echo  Disposition: Skilled nursing facility Diet recommendation: Cardiac diet  DISCHARGE MEDICATION: Allergies as of 08/30/2021   No Known Allergies      Medication List     STOP taking these medications    diltiazem 60 MG tablet Commonly known as: CARDIZEM       TAKE these medications  acetaminophen 500 MG tablet Commonly known as: TYLENOL Take 1,000 mg by mouth every 8 (eight) hours as needed for moderate pain.   alendronate 70 MG tablet Commonly known as: FOSAMAX Take 70 mg by mouth once a week.   ALPRAZolam 0.5 MG tablet Commonly known as:  XANAX Take 1 tablet (0.5 mg total) by mouth 2 (two) times daily as needed.   ascorbic acid 500 MG tablet Commonly known as: VITAMIN C Take 500 mg by mouth daily.   aspirin EC 81 MG tablet Take 1 tablet (81 mg total) by mouth at bedtime. Swallow whole.   atorvastatin 20 MG tablet Commonly known as: LIPITOR Take 1 tablet (20 mg total) by mouth daily.   Biotin 1 MG Caps Take 1 mg by mouth daily.   carvedilol 3.125 MG tablet Commonly known as: COREG Take 1 tablet (3.125 mg total) by mouth 2 (two) times daily with a meal. Hold if SBP<110 or DBP<60   cetirizine 10 MG tablet Commonly known as: ZYRTEC Take 10 mg by mouth every morning.   Cranberry 450 MG Caps Take 1 capsule by mouth daily.   escitalopram 5 MG tablet Commonly known as: LEXAPRO Take 1 tablet (5 mg total) by mouth every morning.   folic acid A999333 MCG tablet Commonly known as: FOLVITE Take 1 tablet (400 mcg total) by mouth daily. SEPARATE ALL SUPPLEMENTS TO LUNCH OR DINNER AND PRILOSEC NOT TO MESS W/THYROID MED   furosemide 40 MG tablet Commonly known as: LASIX Take 1 tablet (40 mg total) by mouth daily. Hold if SBP<110 or DBP<60 Start taking on: August 31, 2021   levothyroxine 88 MCG tablet Commonly known as: SYNTHROID Take 1 tablet (88 mcg total) by mouth daily before breakfast. Skip sundays   lidocaine 5 % Commonly known as: LIDODERM Place 1-2 patches onto the skin daily as needed (pain). Apply patch to back at 8am, then remove at 8pm same day   losartan 25 MG tablet Commonly known as: COZAAR Take 0.5 tablets (12.5 mg total) by mouth daily. Hold if SBP<110 or DBP<60 Start taking on: August 31, 2021   magnesium oxide 400 (240 Mg) MG tablet Commonly known as: MAG-OX Take 1 tablet (400 mg total) by mouth daily. Start taking on: August 31, 2021   melatonin 5 MG Tabs Take 1 tablet (5 mg total) by mouth at bedtime.   montelukast 10 MG tablet Commonly known as: SINGULAIR Take 1 tablet (10 mg total)  by mouth at bedtime.   multivitamin with minerals Tabs tablet Take 1 tablet by mouth daily.   Myrbetriq 50 MG Tb24 tablet Generic drug: mirabegron ER TAKE ONE TABLET EVERY DAY   omeprazole 40 MG capsule Commonly known as: PRILOSEC Take 1 capsule (40 mg total) by mouth daily. After lunch   polyethylene glycol powder 17 GM/SCOOP powder Commonly known as: GLYCOLAX/MIRALAX Take 17 g by mouth daily as needed for moderate constipation.   potassium chloride SA 20 MEQ tablet Commonly known as: KLOR-CON M Take 1 tablet (20 mEq total) by mouth 2 (two) times daily.   pregabalin 75 MG capsule Commonly known as: LYRICA Take 75 mg by mouth 3 (three) times daily.   traZODone 50 MG tablet Commonly known as: DESYREL TAKE 2 TABLETS EVERY DAY 1 HOUR BEFORE BED   vitamin B-12 1000 MCG tablet Commonly known as: CYANOCOBALAMIN Take 1 tablet (1,000 mcg total) by mouth daily.   Vitamin D3 125 MCG (5000 UT) Tabs Take 1 tablet (5,000 Units total) by mouth daily.  Discharge Care Instructions  (From admission, onward)           Start     Ordered   08/30/21 0000  Discharge wound care:       Comments: Left arm skin tear - keep covered with padded foam dressing.  Remove daily to clean and inspect the wound.  Cleanse gently with soap and water, pat dry and re-cover until healing.   08/30/21 1034             Discharge Exam: Filed Weights   08/28/21 0600 08/29/21 0406 08/29/21 1011  Weight: 74.7 kg 77.3 kg 74.7 kg   General exam: awake, alert, no acute distress HEENT: atraumatic, clear conjunctiva, anicteric sclera, moist mucus membranes, hearing grossly normal  Respiratory system: CTAB, no wheezes, rales or rhonchi, normal respiratory effort. Cardiovascular system: normal S1/S2, RRR, no JVD, murmurs, rubs, gallops, trace BLE edema.   Gastrointestinal system: soft, NT, ND, no HSM felt, +bowel sounds. Central nervous system: A&O x3. no gross focal neurologic  deficits, normal speech Extremities: moves all, normal tone Skin: dry, intact, normal temperature Psychiatry: normal mood, congruent affect, judgement and insight appear normal   Condition at discharge: stable  The results of significant diagnostics from this hospitalization (including imaging, microbiology, ancillary and laboratory) are listed below for reference.   Imaging Studies: DG Chest 2 View  Result Date: 08/27/2021 CLINICAL DATA:  76 year old female with a history of CHF EXAM: CHEST - 2 VIEW COMPARISON:  08/25/2021 FINDINGS: Cardiomediastinal silhouette unchanged with cardiomegaly. Low lung volumes persist with mixed vague interstitial and airspace opacities at the lung bases. Mild interlobular septal thickening with blunting of the bilateral costophrenic angles, and meniscus on the lateral view in the costophrenic sulcus. Thickening of the fissures. No pneumothorax. No new confluent airspace disease. Surgical changes of the thoracolumbar region. IMPRESSION: Acute CHF, with bilateral pleural effusions. Overall appears slightly increased from the prior. Electronically Signed   By: Corrie Mckusick D.O.   On: 08/27/2021 08:15   DG Chest 2 View  Result Date: 08/25/2021 CLINICAL DATA:  Dyspnea EXAM: CHEST - 2 VIEW COMPARISON:  03/31/2021 FINDINGS: Moderate bilateral pleural effusions are present with resultant mild retrocardiac opacification. No superimposed focal pulmonary infiltrate. No pneumothorax. Mild cardiomegaly is present, accentuated by seated positioning. Pulmonary vascularity is normal. No acute bone abnormality. Extensive thoracolumbar fusion instrumentation again noted. IMPRESSION: Moderate bilateral pleural effusions. Mild cardiomegaly. Electronically Signed   By: Fidela Salisbury M.D.   On: 08/25/2021 19:56   CARDIAC CATHETERIZATION  Result Date: 08/28/2021   Prox RCA lesion is 20% stenosed. 1.  Mild nonobstructive coronary artery disease. 2.  Left ventricular angiography was not  performed.  EF was 30 to 35% by echo. 3.  Right heart catheterization showed mildly elevated right and left filling pressures, mild pulmonary hypertension and mildly reduced cardiac output. Recommendations: Recommend medical therapy for nonischemic cardiomyopathy. The patient continues to be mildly volume overloaded.  We will continue IV furosemide tonight and switch to oral furosemide tomorrow morning.   DG Chest Port 1 View  Result Date: 08/27/2021 CLINICAL DATA:  Status post thoracentesis EXAM: PORTABLE CHEST 1 VIEW COMPARISON:  08/27/2021 FINDINGS: There is interval decrease in the left pleural effusion. There is blunting of both lateral CP angles. Transverse diameter of heart is increased. Central pulmonary vessels are prominent. There is no pneumothorax. IMPRESSION: There is interval decrease in the left pleural effusion. There is no pneumothorax. Cardiomegaly. Central pulmonary vessels are prominent suggesting possible mild CHF. Small bilateral pleural  effusions. Electronically Signed   By: Elmer Picker M.D.   On: 08/27/2021 12:57   ECHOCARDIOGRAM COMPLETE  Result Date: 08/26/2021    ECHOCARDIOGRAM REPORT   Patient Name:   CHLO… BLEAK Date of Exam: 08/26/2021 Medical Rec #:  [id number]         Height:       64.0 in Accession #:    [id number]        Weight:       158.7 lb Date of Birth:  [birth year]         BSA:          1.773 m Patient Age:    46 years          BP:           100/88 mmHg Patient Gender: F                 HR:           78 bpm. Exam Location:  [facility] Procedure: 2D Echo, Cardiac Doppler and Color Doppler Indications:     CHF-Acute Diastolic [id number]  History:         Patient has prior history of Echocardiogram examinations.                  Stroke. Palps.  Sonographer:     Alyse Low Roar Referring Phys:  [id number] Loletha Grayer Diagnosing Phys: Ida Rogue MD IMPRESSIONS  1. Left ventricular ejection fraction, by estimation, is 30 to 35%. The left ventricle has moderately decreased  function. The left ventricle demonstrates global hypokinesis. The left ventricular internal cavity size was mildly dilated. Left ventricular diastolic parameters are consistent with Grade I diastolic dysfunction (impaired relaxation).  2. Right ventricular systolic function is normal. The right ventricular size is normal. Tricuspid regurgitation signal is inadequate for assessing PA pressure.  3. Left atrial size was mild to moderately dilated.  4. Moderate pericardial effusion. The pericardial effusion is circumferential. There is no evidence of cardiac tamponade. Moderate pleural effusion.  5. The mitral valve is normal in structure. Moderate mitral valve regurgitation. No evidence of mitral stenosis.  6. The aortic valve is normal in structure. Aortic valve regurgitation is not visualized. Aortic valve sclerosis is present, with no evidence of aortic valve stenosis. FINDINGS  Left Ventricle: Left ventricular ejection fraction, by estimation, is 30 to 35%. The left ventricle has moderately decreased function. The left ventricle demonstrates global hypokinesis. The left ventricular internal cavity size was mildly dilated. There is no left ventricular hypertrophy. Left ventricular diastolic parameters are consistent with Grade I diastolic dysfunction (impaired relaxation). Right Ventricle: The right ventricular size is normal. No increase in right ventricular wall thickness. Right ventricular systolic function is normal. Tricuspid regurgitation signal is inadequate for assessing PA pressure. Left Atrium: Left atrial size was mild to moderately dilated. Right Atrium: Right atrial size was normal in size. Pericardium: A moderately sized pericardial effusion is present. The pericardial effusion is circumferential. There is diastolic collapse of the right atrial wall. There is no evidence of cardiac tamponade. Mitral Valve: The mitral valve is normal in structure. Moderate mitral valve regurgitation. No evidence of  mitral valve stenosis. Tricuspid Valve: The tricuspid valve is normal in structure. Tricuspid valve regurgitation is mild . No evidence of tricuspid stenosis. Aortic Valve: The aortic valve is normal in structure. Aortic valve regurgitation is not visualized. Aortic valve sclerosis is present, with no evidence of aortic valve stenosis. Aortic valve peak gradient measures 9.9 mmHg.  Pulmonic Valve: The pulmonic valve was normal in structure. Pulmonic valve regurgitation is not visualized. No evidence of pulmonic stenosis. Aorta: The aortic root is normal in size and structure. Venous: The inferior vena cava was not well visualized. IAS/Shunts: No atrial level shunt detected by color flow Doppler. Additional Comments: There is a moderate pleural effusion.  LEFT VENTRICLE PLAX 2D LVIDd:         5.10 cm      Diastology LVIDs:         4.40 cm      LV e' medial:    4.90 cm/s LV PW:         0.90 cm      LV E/e' medial:  19.3 LV IVS:        0.80 cm      LV e' lateral:   7.62 cm/s LVOT diam:     1.70 cm      LV E/e' lateral: 12.4 LVOT Area:     2.27 cm  LV Volumes (MOD) LV vol d, MOD A2C: 102.0 ml LV vol d, MOD A4C: 97.8 ml LV vol s, MOD A2C: 71.3 ml LV vol s, MOD A4C: 72.5 ml LV SV MOD A2C:     30.7 ml LV SV MOD A4C:     97.8 ml LV SV MOD BP:      28.7 ml RIGHT VENTRICLE RV Mid diam:    2.80 cm RV S prime:     10.00 cm/s TAPSE (M-mode): 2.0 cm LEFT ATRIUM             Index        RIGHT ATRIUM           Index LA diam:        4.60 cm 2.59 cm/m   RA Area:     15.90 cm LA Vol (A2C):   66.4 ml 37.44 ml/m  RA Volume:   39.10 ml  22.05 ml/m LA Vol (A4C):   67.9 ml 38.29 ml/m LA Biplane Vol: 72.8 ml 41.05 ml/m  AORTIC VALVE                 PULMONIC VALVE AV Area (Vmax): 1.07 cm     PV Vmax:          0.86 m/s AV Vmax:        157.00 cm/s  PV Peak grad:     2.9 mmHg AV Peak Grad:   9.9 mmHg     PR End Diast Vel: 2.57 msec LVOT Vmax:      74.10 cm/s   RVOT Peak grad:   1 mmHg  AORTA Ao Root diam: 2.70 cm Ao Asc diam:  2.80 cm  MITRAL VALVE                TRICUSPID VALVE MV Area (PHT): 3.15 cm     TR Peak grad:   18.7 mmHg MV Decel Time: 241 msec     TR Vmax:        216.00 cm/s MV E velocity: 94.70 cm/s MV A velocity: 114.00 cm/s  SHUNTS MV E/A ratio:  0.83         Systemic Diam: 1.70 cm MV A Prime:    10.7 cm/s Ida Rogue MD Electronically signed by Ida Rogue MD Signature Date/Time: 08/26/2021/12:50:20 PM    Final    US THORACENTESIS ASP PLEURAL SPACE W/IMG GUIDE  Result Date: 08/27/2021 INDICATION: Shortness of breath with bilateral pleural effusions request received for diagnostic  and therapeutic thoracentesis. EXAM: ULTRASOUND GUIDED LEFT THORACENTESIS MEDICATIONS: Local 1% lidocaine only COMPLICATIONS: None immediate.  No pneumothorax on follow-up chest radiograph. PROCEDURE: An ultrasound guided thoracentesis was thoroughly discussed with the patient and questions answered. The benefits, risks, alternatives and complications were also discussed. The patient understands and wishes to proceed with the procedure. Written consent was obtained. Ultrasound was performed to localize and mark an adequate pocket of fluid in the left chest. The area was then prepped and draped in the normal sterile fashion. 1% Lidocaine was used for local anesthesia. Under ultrasound guidance a 19 gauge, 7-cm, Yueh catheter was introduced. Thoracentesis was performed. The catheter was removed and a dressing applied. FINDINGS: A total of approximately 500 mL of clear yellow fluid was removed. Samples were sent to the laboratory as requested by the clinical team. IMPRESSION: Successful ultrasound guided left thoracentesis yielding 500 mL of pleural fluid. This exam was performed by Tsosie Billing PA-C, and was supervised and interpreted by Dr. Vernard Gambles. Electronically Signed   By: Lucrezia Europe M.D.   On: 08/27/2021 13:45    Microbiology: Results for orders placed or performed during the hospital encounter of 08/25/21  Resp Panel by RT-PCR (Flu A&B,  Covid) Nasopharyngeal Swab     Status: None   Collection Time: 08/26/21 12:46 AM   Specimen: Nasopharyngeal Swab; Nasopharyngeal(NP) swabs in vial transport medium  Result Value Ref Range Status   SARS Coronavirus 2 by RT PCR NEGATIVE NEGATIVE Final    Comment: (NOTE) SARS-CoV-2 target nucleic acids are NOT DETECTED.  The SARS-CoV-2 RNA is generally detectable in upper respiratory specimens during the acute phase of infection. The lowest concentration of SARS-CoV-2 viral copies this assay can detect is 138 copies/mL. A negative result does not preclude SARS-Cov-2 infection and should not be used as the sole basis for treatment or other patient management decisions. A negative result may occur with  improper specimen collection/handling, submission of specimen other than nasopharyngeal swab, presence of viral mutation(s) within the areas targeted by this assay, and inadequate number of viral copies(<138 copies/mL). A negative result must be combined with clinical observations, patient history, and epidemiological information. The expected result is Negative.  Fact Sheet for Patients:  EntrepreneurPulse.com.au  Fact Sheet for Healthcare Providers:  IncredibleEmployment.be  This test is no t yet approved or cleared by the [place] FDA and  has been authorized for detection and/or diagnosis of SARS-CoV-2 by FDA under an Emergency Use Authorization (EUA). This EUA will remain  in effect (meaning this test can be used) for the duration of the COVID-19 declaration under Section 564(b)(1) of the Act, 21 U.S.C.section 360bbb-3(b)(1), unless the authorization is terminated  or revoked sooner.       Influenza A by PCR NEGATIVE NEGATIVE Final   Influenza B by PCR NEGATIVE NEGATIVE Final    Comment: (NOTE) The Xpert Xpress SARS-CoV-2/FLU/RSV plus assay is intended as an aid in the diagnosis of influenza from Nasopharyngeal swab specimens and should  not be used as a sole basis for treatment. Nasal washings and aspirates are unacceptable for Xpert Xpress SARS-CoV-2/FLU/RSV testing.  Fact Sheet for Patients: EntrepreneurPulse.com.au  Fact Sheet for Healthcare Providers: IncredibleEmployment.be  This test is not yet approved or cleared by the [place] FDA and has been authorized for detection and/or diagnosis of SARS-CoV-2 by FDA under an Emergency Use Authorization (EUA). This EUA will remain in effect (meaning this test can be used) for the duration of the COVID-19 declaration under Section 564(b)(1) of the Act,  21 U.S.C. section 360bbb-3(b)(1), unless the authorization is terminated or revoked.  Performed at [facility], [address] [place], [place] [address]   MRSA Next Gen by PCR, Nasal     Status: None   Collection Time: 08/26/21  2:20 PM   Specimen: Nasal Mucosa; Nasal Swab  Result Value Ref Range Status   MRSA by PCR Next Gen NOT DETECTED NOT DETECTED Final    Comment: (NOTE) The GeneXpert MRSA Assay (FDA approved for NASAL specimens only), is one component of a comprehensive MRSA colonization surveillance program. It is not intended to diagnose MRSA infection nor to guide or monitor treatment for MRSA infections. Test performance is not FDA approved in patients less than 90 years old. Performed at [facility], [address] [place], [place] [address]   Body fluid culture w Gram Stain     Status: None (Preliminary result)   Collection Time: 08/27/21 11:44 AM   Specimen: PATH Cytology Pleural fluid  Result Value Ref Range Status   Specimen Description   Final    PLEURAL Performed at [facility], [address] [place], [place] [address]    Special Requests   Final    PLEURAL Performed at [facility], [address] [place], [place] [address]    Gram Stain   Final    FEW WBC PRESENT, PREDOMINANTLY MONONUCLEAR NO  ORGANISMS SEEN    Culture   Final    NO GROWTH 3 DAYS Performed at [facility], [address] [place], [place] [address]    Report Status PENDING  Incomplete  Resp Panel by RT-PCR (Flu A&B, Covid) Nasopharyngeal Swab     Status: None   Collection Time: 08/29/21  5:10 PM   Specimen: Nasopharyngeal Swab; Nasopharyngeal(NP) swabs in vial transport medium  Result Value Ref Range Status   SARS Coronavirus 2 by RT PCR NEGATIVE NEGATIVE Final    Comment: (NOTE) SARS-CoV-2 target nucleic acids are NOT DETECTED.  The SARS-CoV-2 RNA is generally detectable in upper respiratory specimens during the acute phase of infection. The lowest concentration of SARS-CoV-2 viral copies this assay can detect is 138 copies/mL. A negative result does not preclude SARS-Cov-2 infection and should not be used as the sole basis for treatment or other patient management decisions. A negative result may occur with  improper specimen collection/handling, submission of specimen other than nasopharyngeal swab, presence of viral mutation(s) within the areas targeted by this assay, and inadequate number of viral copies(<138 copies/mL). A negative result must be combined with clinical observations, patient history, and epidemiological information. The expected result is Negative.  Fact Sheet for Patients:  EntrepreneurPulse.com.au  Fact Sheet for Healthcare Providers:  IncredibleEmployment.be  This test is no t yet approved or cleared by the [place] FDA and  has been authorized for detection and/or diagnosis of SARS-CoV-2 by FDA under an Emergency Use Authorization (EUA). This EUA will remain  in effect (meaning this test can be used) for the duration of the COVID-19 declaration under Section 564(b)(1) of the Act, 21 U.S.C.section 360bbb-3(b)(1), unless the authorization is terminated  or revoked sooner.       Influenza A by PCR NEGATIVE NEGATIVE Final    Influenza B by PCR NEGATIVE NEGATIVE Final    Comment: (NOTE) The Xpert Xpress SARS-CoV-2/FLU/RSV plus assay is intended as an aid in the diagnosis of influenza from Nasopharyngeal swab specimens and should not be used as a sole basis for treatment. Nasal washings and aspirates are  unacceptable for Xpert Xpress SARS-CoV-2/FLU/RSV testing.  Fact Sheet for Patients: EntrepreneurPulse.com.au  Fact Sheet for Healthcare Providers: IncredibleEmployment.be  This test is not yet approved or cleared by the [place] FDA and has been authorized for detection and/or diagnosis of SARS-CoV-2 by FDA under an Emergency Use Authorization (EUA). This EUA will remain in effect (meaning this test can be used) for the duration of the COVID-19 declaration under Section 564(b)(1) of the Act, 21 U.S.C. section 360bbb-3(b)(1), unless the authorization is terminated or revoked.  Performed at [facility], [address] [place], [place] [address]     Labs: CBC: Recent Labs  Lab 08/25/21 1913 08/26/21 0448 08/29/21 0420 08/30/21 0628  WBC 3.9* 3.5* 3.2* 3.4*  HGB 11.9* 11.1* 11.9* 11.6*  HCT 35.7* 34.7* 36.1 34.8*  MCV 95.7 95.3 93.5 93.8  PLT 124* 119* 123* [id number]*   Basic Metabolic Panel: Recent Labs  Lab 08/26/21 0448 08/27/21 0606 08/28/21 0804 08/29/21 0420 08/30/21 0628  NA 139 137 135 134* 137  K 3.9 3.5 3.7 4.3 4.3  CL 108 103 100 97* 100  CO2 25 28 29 28  32  GLUCOSE 89 89 105* 84 96  BUN 23 18 17 20 22   CREATININE 0.81 0.75 0.75 0.90 0.82  CALCIUM 8.4* 8.3* 8.6* 8.6* 9.1  MG 1.7 1.7  --   --  1.8   Liver Function Tests: No results for input(s): AST, ALT, ALKPHOS, BILITOT, PROT, ALBUMIN in the last 168 hours. CBG: No results for input(s): GLUCAP in the last 168 hours.  Discharge time spent: greater than 30 minutes.  Signed: Ezekiel Slocumb, DO [facility] 08/30/2021

## 2021-08-31 DIAGNOSIS — M6281 Muscle weakness (generalized): Secondary | ICD-10-CM | POA: Diagnosis not present

## 2021-08-31 DIAGNOSIS — R2681 Unsteadiness on feet: Secondary | ICD-10-CM | POA: Diagnosis not present

## 2021-08-31 DIAGNOSIS — R279 Unspecified lack of coordination: Secondary | ICD-10-CM | POA: Diagnosis not present

## 2021-08-31 DIAGNOSIS — R2689 Other abnormalities of gait and mobility: Secondary | ICD-10-CM | POA: Diagnosis not present

## 2021-08-31 DIAGNOSIS — R531 Weakness: Secondary | ICD-10-CM | POA: Diagnosis not present

## 2021-08-31 LAB — BODY FLUID CULTURE W GRAM STAIN: Culture: NO GROWTH

## 2021-09-03 DIAGNOSIS — M6281 Muscle weakness (generalized): Secondary | ICD-10-CM | POA: Diagnosis not present

## 2021-09-03 DIAGNOSIS — R279 Unspecified lack of coordination: Secondary | ICD-10-CM | POA: Diagnosis not present

## 2021-09-03 DIAGNOSIS — R2681 Unsteadiness on feet: Secondary | ICD-10-CM | POA: Diagnosis not present

## 2021-09-03 DIAGNOSIS — R531 Weakness: Secondary | ICD-10-CM | POA: Diagnosis not present

## 2021-09-03 DIAGNOSIS — R2689 Other abnormalities of gait and mobility: Secondary | ICD-10-CM | POA: Diagnosis not present

## 2021-09-04 DIAGNOSIS — M6281 Muscle weakness (generalized): Secondary | ICD-10-CM | POA: Diagnosis not present

## 2021-09-04 DIAGNOSIS — R2689 Other abnormalities of gait and mobility: Secondary | ICD-10-CM | POA: Diagnosis not present

## 2021-09-04 DIAGNOSIS — R2681 Unsteadiness on feet: Secondary | ICD-10-CM | POA: Diagnosis not present

## 2021-09-04 DIAGNOSIS — R531 Weakness: Secondary | ICD-10-CM | POA: Diagnosis not present

## 2021-09-04 DIAGNOSIS — R279 Unspecified lack of coordination: Secondary | ICD-10-CM | POA: Diagnosis not present

## 2021-09-04 NOTE — Progress Notes (Deleted)
°   Patient ID: Kimberly Cook, female    DOB: 1946/05/11, 76 y.o.   MRN: [id number]  HPI  Ms Mckelvin is a 76 y/o female with a history of  Echo report from 08/26/21 reviewed and showed an EF of 30-35% along with moderate LAE and moderate MR.   RHC/LHC done 08/28/21 and showed: Prox RCA lesion is 20% stenosed.   1.  Mild nonobstructive coronary artery disease. 2.  Left ventricular angiography was not performed.  EF was 30 to 35% by echo. 3.  Right heart catheterization showed mildly elevated right and left filling pressures, mild pulmonary hypertension and mildly reduced cardiac output.  Admitted 08/25/21 due to shortness of breath and weight gain. Initially given IV lasix with transition to oral diuretics. Cardiology consult obtained. Left thoracentesis done with removal of . BP parameters given for holding losartan, carvedilol and furosemide. PT/OT evaluations done. Discharged after 5 days to SNF.   She presents today for her initial visit with a chief complaint of   Review of Systems/    Physical Exam    Assessment & Plan:  1: Chronic heart failure with reduced ejection fraction- - NYHA class - BNP 08/25/21 was 353.9  2: HTN- - BP - currently seeing PCP at facility - [facility] 08/30/21 reviewed and showed sodium 137, potassium 4.3, creatinine 0.82 and GFR >60  3: CAD- - saw cardiology (End) 07/06/21

## 2021-09-05 ENCOUNTER — Ambulatory Visit: Payer: Medicare HMO | Admitting: Family

## 2021-09-05 DIAGNOSIS — R279 Unspecified lack of coordination: Secondary | ICD-10-CM | POA: Diagnosis not present

## 2021-09-05 DIAGNOSIS — R2689 Other abnormalities of gait and mobility: Secondary | ICD-10-CM | POA: Diagnosis not present

## 2021-09-05 DIAGNOSIS — R2681 Unsteadiness on feet: Secondary | ICD-10-CM | POA: Diagnosis not present

## 2021-09-05 DIAGNOSIS — R531 Weakness: Secondary | ICD-10-CM | POA: Diagnosis not present

## 2021-09-05 DIAGNOSIS — M6281 Muscle weakness (generalized): Secondary | ICD-10-CM | POA: Diagnosis not present

## 2021-09-06 DIAGNOSIS — R2681 Unsteadiness on feet: Secondary | ICD-10-CM | POA: Diagnosis not present

## 2021-09-06 DIAGNOSIS — R531 Weakness: Secondary | ICD-10-CM | POA: Diagnosis not present

## 2021-09-06 DIAGNOSIS — R2689 Other abnormalities of gait and mobility: Secondary | ICD-10-CM | POA: Diagnosis not present

## 2021-09-06 DIAGNOSIS — M6281 Muscle weakness (generalized): Secondary | ICD-10-CM | POA: Diagnosis not present

## 2021-09-06 DIAGNOSIS — R279 Unspecified lack of coordination: Secondary | ICD-10-CM | POA: Diagnosis not present

## 2021-09-07 DIAGNOSIS — R531 Weakness: Secondary | ICD-10-CM | POA: Diagnosis not present

## 2021-09-07 DIAGNOSIS — R2681 Unsteadiness on feet: Secondary | ICD-10-CM | POA: Diagnosis not present

## 2021-09-07 DIAGNOSIS — M6281 Muscle weakness (generalized): Secondary | ICD-10-CM | POA: Diagnosis not present

## 2021-09-07 DIAGNOSIS — R279 Unspecified lack of coordination: Secondary | ICD-10-CM | POA: Diagnosis not present

## 2021-09-07 DIAGNOSIS — R2689 Other abnormalities of gait and mobility: Secondary | ICD-10-CM | POA: Diagnosis not present

## 2021-09-08 DIAGNOSIS — R531 Weakness: Secondary | ICD-10-CM | POA: Diagnosis not present

## 2021-09-08 DIAGNOSIS — R2681 Unsteadiness on feet: Secondary | ICD-10-CM | POA: Diagnosis not present

## 2021-09-08 DIAGNOSIS — R279 Unspecified lack of coordination: Secondary | ICD-10-CM | POA: Diagnosis not present

## 2021-09-08 DIAGNOSIS — R2689 Other abnormalities of gait and mobility: Secondary | ICD-10-CM | POA: Diagnosis not present

## 2021-09-08 DIAGNOSIS — M6281 Muscle weakness (generalized): Secondary | ICD-10-CM | POA: Diagnosis not present

## 2021-09-09 DIAGNOSIS — R279 Unspecified lack of coordination: Secondary | ICD-10-CM | POA: Diagnosis not present

## 2021-09-09 DIAGNOSIS — R531 Weakness: Secondary | ICD-10-CM | POA: Diagnosis not present

## 2021-09-09 DIAGNOSIS — R2689 Other abnormalities of gait and mobility: Secondary | ICD-10-CM | POA: Diagnosis not present

## 2021-09-09 DIAGNOSIS — R2681 Unsteadiness on feet: Secondary | ICD-10-CM | POA: Diagnosis not present

## 2021-09-09 DIAGNOSIS — M6281 Muscle weakness (generalized): Secondary | ICD-10-CM | POA: Diagnosis not present

## 2021-09-10 DIAGNOSIS — R2689 Other abnormalities of gait and mobility: Secondary | ICD-10-CM | POA: Diagnosis not present

## 2021-09-10 DIAGNOSIS — R279 Unspecified lack of coordination: Secondary | ICD-10-CM | POA: Diagnosis not present

## 2021-09-10 DIAGNOSIS — R2681 Unsteadiness on feet: Secondary | ICD-10-CM | POA: Diagnosis not present

## 2021-09-10 DIAGNOSIS — M6281 Muscle weakness (generalized): Secondary | ICD-10-CM | POA: Diagnosis not present

## 2021-09-10 DIAGNOSIS — R531 Weakness: Secondary | ICD-10-CM | POA: Diagnosis not present

## 2021-09-11 ENCOUNTER — Other Ambulatory Visit: Payer: Self-pay

## 2021-09-11 ENCOUNTER — Ambulatory Visit (INDEPENDENT_AMBULATORY_CARE_PROVIDER_SITE_OTHER): Payer: Medicare HMO | Admitting: Nurse Practitioner

## 2021-09-11 ENCOUNTER — Encounter: Payer: Self-pay | Admitting: Nurse Practitioner

## 2021-09-11 VITALS — BP 116/80 | HR 68 | Ht 64.0 in | Wt 159.0 lb

## 2021-09-11 DIAGNOSIS — I3139 Other pericardial effusion (noninflammatory): Secondary | ICD-10-CM

## 2021-09-11 DIAGNOSIS — I2510 Atherosclerotic heart disease of native coronary artery without angina pectoris: Secondary | ICD-10-CM | POA: Diagnosis not present

## 2021-09-11 DIAGNOSIS — R531 Weakness: Secondary | ICD-10-CM | POA: Diagnosis not present

## 2021-09-11 DIAGNOSIS — I5022 Chronic systolic (congestive) heart failure: Secondary | ICD-10-CM

## 2021-09-11 DIAGNOSIS — R2689 Other abnormalities of gait and mobility: Secondary | ICD-10-CM | POA: Diagnosis not present

## 2021-09-11 DIAGNOSIS — R279 Unspecified lack of coordination: Secondary | ICD-10-CM | POA: Diagnosis not present

## 2021-09-11 DIAGNOSIS — E782 Mixed hyperlipidemia: Secondary | ICD-10-CM | POA: Diagnosis not present

## 2021-09-11 DIAGNOSIS — I10 Essential (primary) hypertension: Secondary | ICD-10-CM | POA: Diagnosis not present

## 2021-09-11 DIAGNOSIS — N182 Chronic kidney disease, stage 2 (mild): Secondary | ICD-10-CM | POA: Diagnosis not present

## 2021-09-11 DIAGNOSIS — I428 Other cardiomyopathies: Secondary | ICD-10-CM

## 2021-09-11 DIAGNOSIS — M6281 Muscle weakness (generalized): Secondary | ICD-10-CM | POA: Diagnosis not present

## 2021-09-11 DIAGNOSIS — R2681 Unsteadiness on feet: Secondary | ICD-10-CM | POA: Diagnosis not present

## 2021-09-11 MED ORDER — DAPAGLIFLOZIN PROPANEDIOL 10 MG PO TABS
10.0000 mg | ORAL_TABLET | Freq: Every day | ORAL | 6 refills | Status: DC
Start: 2021-09-11 — End: 2022-04-17

## 2021-09-11 MED ORDER — DAPAGLIFLOZIN PROPANEDIOL 10 MG PO TABS: 10.0000 mg | ORAL_TABLET | Freq: Every day | ORAL | 5 refills | Status: DC

## 2021-09-11 NOTE — Progress Notes (Signed)
Office Visit    Patient Name: Kimberly Cook Date of Encounter: 09/11/2021  Primary Care Provider:  Virgie Dad, FNP Primary Cardiologist:  Nelva Bush, MD  Chief Complaint    76 year old female with a history of nonobstructive CAD, hypertension, hyperlipidemia, hypothyroidism, stroke, GERD, CKD II, depression, and anxiety who presents for follow-up after recent hospitalization for acute systolic heart failure and nonischemic cardiomyopathy (EF 30 to 35%).  Past Medical History    Past Medical History:  Diagnosis Date   Allergy    Anxiety    Arthritis    Back pain    Chronic HFrEF (heart failure with reduced ejection fraction) ([place])    a. 08/2021 echo: EF 30-35%, glob HK, GrI DD.   CKD (chronic kidney disease), stage II    Coronary artery disease    a. Mild to moderate CAD in LAD/diagonal by CTA (CT-FFR of apical LAD 0.79); b. 08/2021 Cath: LM nl, LAD min irregs, D1/2/3 nl, LCX nl, OM2/3 nl, RCA 20p, RPDA mild dzs, RPAV nl.   DDD (degenerative disc disease), lumbar    Depression    Diverticulosis    Essential hypertension    GERD (gastroesophageal reflux disease)    Headache    History of shingles    Hyperlipidemia    Hypothyroidism    Lung nodule    Mini stroke 2011   Moderate Pericardial effusion    a. 08/2021 Echo: moderate circumferential pericardial effusion w/o tamponade.   NICM (nonischemic cardiomyopathy) ([place])    a. 08/2021 Echo: EF 30-35%, glob HK, GrI DD, nl RV fxn, mild-mod dil LA, mod circumferential pericardial eff w/o tamponade, Mod MR.   Occipital neuralgia    Palpitations    Pleural effusion on left    a. 08/2021 s/p thoracentesis.   Pneumonia 2018   Prediabetes    Stroke [facility])    Stroke ([place])    MRI 04/2008 + left sup. frontal gyrus possibly puntate infarct    Syncope 2019   Urinary tract infection    Vitamin D deficiency    Past Surgical History:  Procedure Laterality Date   ABDOMINAL HYSTERECTOMY     BLADDER SURGERY     2003    BREAST EXCISIONAL BIOPSY Right Over 20 years    Benign   CHOLECYSTECTOMY     gastroplication      JOINT REPLACEMENT Left    KNEE   KNEE ARTHROSCOPY Left 2011   PULSE GENERATOR IMPLANT Left 01/31/2020   Procedure: PLACEMENT RIGHT FLANK PULSE GENERATOR VS REMOVAL SPINAL CORD STIMULATOR;  Surgeon: Deetta Perla, MD;  Location: [facility] ORS;  Service: Neurosurgery;  Laterality: Left;   PULSE GENERATOR IMPLANT Left 04/24/2020   Procedure: REPLACEMENT LEFT FLANK PULSE GENERATOR IMPLANT;  Surgeon: Deetta Perla, MD;  Location: [facility] ORS;  Service: Neurosurgery;  Laterality: Left;  MAC w/ local   right arm fracture     RIGHT/LEFT HEART CATH AND CORONARY ANGIOGRAPHY N/A 08/28/2021   Procedure: RIGHT/LEFT HEART CATH AND CORONARY ANGIOGRAPHY;  Surgeon: Wellington Hampshire, MD;  Location: [facility];  Service: Cardiovascular;  Laterality: N/A;   SPINAL CORD STIMULATOR REMOVAL N/A 06/26/2020   Procedure: SPINAL CORD STIMULATOR REMOVAL;  Surgeon: Deetta Perla, MD;  Location: [facility] ORS;  Service: Neurosurgery;  Laterality: N/A;   THORACIC LAMINECTOMY FOR SPINAL CORD STIMULATOR N/A 01/24/2020   Procedure: THORACIC SPINAL CORD STIMULATOR PADDLE TRIAL VIA LAMINECTOMY;  Surgeon: Deetta Perla, MD;  Location: [facility] ORS;  Service: Neurosurgery;  Laterality: N/A;   TONSILLECTOMY AND  ADENOIDECTOMY     TOTAL KNEE ARTHROPLASTY Left 04/17/2015   Procedure: LEFT TOTAL KNEE ARTHROPLASTY;  Surgeon: Paralee Cancel, MD;  Location: [facility] ORS;  Service: Orthopedics;  Laterality: Left;    Allergies  No Known Allergies  History of Present Illness    76 year old female with above past medical history including nonobstructive CAD, hypertension, hyperlipidemia, hypothyroidism, CKD II, stroke, GERD, depression, and anxiety.  She was previous evaluated in outpatient cardiology clinic in the setting of chest pain and dyspnea with echocardiogram August 2018 showing EF of 50 to 55% with grade 1 diastolic dysfunction.  Coronary CT angiography  in September 2018 showed mild nonobstructive CAD involving the proximal LAD and second diagonal.  She subsequently had a low risk Myoview in February 2022 in the setting of intermittent chest discomfort.  Ms. Speelman was admitted to [facility] on January 21 with worsening dyspnea and ankle swelling.  She was also noted to have moderate bilateral pleural effusions.  Echocardiogram showed an EF of 30 to 35% with global hypokinesis, moderate mitral regurgitation, and a moderate circumferential pericardial effusion without tamponade.  She underwent diagnostic catheterization which revealed mild proximal RCA disease and otherwise relatively normal coronary arteries.  She was medically managed from a heart failure standpoint.  In the setting of moderate pleural effusions, she underwent thoracentesis on the left with 500 mL removed.  On telemetry, she was noted to have frequent PACs and brief runs of atrial tachycardia with intermittent Mobitz 1, typically seen coming out of an atrial run.  She was discharged back to her skilled nursing facility on January 26 on carvedilol, losartan, and Lasix 40 mg daily with plan for early cardiology follow-up and repeat limited echo.  Since her discharge, she has done reasonably well.  She has been working with physical therapy at the skilled nursing facility and tolerating this well.  She mostly sits in her wheelchair throughout the day but uses her walker some.  She has noted minimal lower extremity swelling, usually late in the evening and denies dyspnea, palpitations, or chest pain.  No PND, orthopnea, dizziness, syncope, or early satiety.  She has had 2 episodes of nausea with vomiting, which she thinks is related to eating food too quickly in the setting of a prior history of GERD and esophageal surgery in the past.  Home Medications    Current Outpatient Medications  Medication Sig Dispense Refill   acetaminophen (TYLENOL) 500 MG tablet Take 1,000 mg by mouth  every 8 (eight) hours as needed for moderate pain.     alendronate (FOSAMAX) 70 MG tablet Take 70 mg by mouth once a week.      ALPRAZolam (XANAX) 0.5 MG tablet Take 1 tablet (0.5 mg total) by mouth 2 (two) times daily as needed. 30 tablet 0   ascorbic acid (VITAMIN C) 500 MG tablet Take 500 mg by mouth daily.     aspirin EC 81 MG tablet Take 1 tablet (81 mg total) by mouth at bedtime. Swallow whole. 90 tablet 3   atorvastatin (LIPITOR) 20 MG tablet Take 1 tablet (20 mg total) by mouth daily. 90 tablet 3   Biotin 1 MG CAPS Take 1 mg by mouth daily.      carvedilol (COREG) 3.125 MG tablet Take 1 tablet (3.125 mg total) by mouth 2 (two) times daily with a meal. Hold if SBP<110 or DBP<60     cetirizine (ZYRTEC) 10 MG tablet Take 10 mg by mouth every morning.     Cholecalciferol (VITAMIN D3)  125 MCG (5000 UT) TABS Take 1 tablet (5,000 Units total) by mouth daily. 90 tablet 3   Cranberry 450 MG CAPS Take 1 capsule by mouth daily.     escitalopram (LEXAPRO) 5 MG tablet Take 1 tablet (5 mg total) by mouth every morning. 90 tablet 3   folic acid (FOLVITE) 321 MCG tablet Take 1 tablet (400 mcg total) by mouth daily. SEPARATE ALL SUPPLEMENTS TO LUNCH OR DINNER AND PRILOSEC NOT TO MESS W/THYROID MED 90 tablet 3   furosemide (LASIX) 40 MG tablet Take 1 tablet (40 mg total) by mouth daily. Hold if SBP<110 or DBP<60 30 tablet    levothyroxine (SYNTHROID) 88 MCG tablet Take 1 tablet (88 mcg total) by mouth daily before breakfast. Skip sundays 90 tablet 3   lidocaine (LIDODERM) 5 % Place 1-2 patches onto the skin daily as needed (pain). Apply patch to back at 8am, then remove at 8pm same day     losartan (COZAAR) 25 MG tablet Take 0.5 tablets (12.5 mg total) by mouth daily. Hold if SBP<110 or DBP<60     magnesium oxide (MAG-OX) 400 (240 Mg) MG tablet Take 1 tablet (400 mg total) by mouth daily.     melatonin 5 MG TABS Take 1 tablet (5 mg total) by mouth at bedtime.  0   montelukast (SINGULAIR) 10 MG tablet Take 1  tablet (10 mg total) by mouth at bedtime. 90 tablet 3   Multiple Vitamin (MULTIVITAMIN WITH MINERALS) TABS tablet Take 1 tablet by mouth daily.     MYRBETRIQ 50 MG TB24 tablet TAKE ONE TABLET EVERY DAY 30 tablet 11   omeprazole (PRILOSEC) 40 MG capsule Take 1 capsule (40 mg total) by mouth daily. After lunch 90 capsule 3   polyethylene glycol powder (GLYCOLAX/MIRALAX) 17 GM/SCOOP powder Take 17 g by mouth daily as needed for moderate constipation. 255 g 11   potassium chloride SA (KLOR-CON M) 20 MEQ tablet Take 1 tablet (20 mEq total) by mouth 2 (two) times daily.     pregabalin (LYRICA) 75 MG capsule Take 75 mg by mouth 3 (three) times daily.     traZODone (DESYREL) 50 MG tablet TAKE 2 TABLETS EVERY DAY 1 HOUR BEFORE BED 180 tablet 1   vitamin B-12 (CYANOCOBALAMIN) 1000 MCG tablet Take 1 tablet (1,000 mcg total) by mouth daily. 90 tablet 3   No current facility-administered medications for this visit.     Review of Systems    Overall doing well from a cardiac standpoint without chest pain, dyspnea, palpitations, PND, orthopnea, dizziness, syncope, or early satiety.  She has noted minimal lower extremity swelling in the evenings.  She had 2 episodes of nausea and vomiting the setting of eating very fast with prior history of Nissen fundoplication.  All other systems reviewed and are otherwise negative except as noted above.    Physical Exam    VS:  BP 116/80 (BP Location: Left Arm, Patient Position: Sitting, Cuff Size: Normal)    Pulse 68    Ht _0  (1.626 m)    Wt 159 lb (72.1 kg)    SpO2 98%    BMI 27.29 kg/m  , BMI Body mass index is 27.29 kg/m.     GEN: Well nourished, well developed, in no acute distress. HEENT: normal. Neck: Supple, no JVD, carotid bruits, or masses. Cardiac: RRR, no murmurs, rubs, or gallops. No clubbing, cyanosis, edema.  Radials/PT 2+ and equal bilaterally.  Respiratory:  Respirations regular and unlabored, clear to auscultation bilaterally. GI:  Soft,  nontender, nondistended, BS + x 4. MS: no deformity or atrophy. Skin: warm and dry, no rash. Neuro:  Strength and sensation are intact. Psych: Normal affect.  Accessory Clinical Findings    EKG: Sinus rhythm, 68, PACs, nonspecific T changes, prolonged QT-personally reviewed  Lab Results  Component Value Date   WBC 3.4 (L) 08/30/2021   HGB 11.6 (L) 08/30/2021   HCT 34.8 (L) 08/30/2021   MCV 93.8 08/30/2021   PLT 118 (L) 08/30/2021   Lab Results  Component Value Date   CREATININE 0.82 08/30/2021   BUN 22 08/30/2021   NA 137 08/30/2021   K 4.3 08/30/2021   CL 100 08/30/2021   CO2 32 08/30/2021   Lab Results  Component Value Date   ALT 19 03/31/2021   AST 32 03/31/2021   ALKPHOS 102 03/31/2021   BILITOT 1.2 03/31/2021   Lab Results  Component Value Date   CHOL 125 11/24/2019   HDL 46.40 11/24/2019   LDLCALC 47 11/24/2019   TRIG 158.0 (H) 11/24/2019   CHOLHDL 3 11/24/2019    Lab Results  Component Value Date   HGBA1C 5.1 11/24/2019    Assessment & Plan    1.  Chronic heart failure with reduced ejection fraction/nonischemic cardiomyopathy: Patient recently hospitalized secondary to dyspnea and lower extremity swelling and found to have an EF of 30 to 35% with global hypokinesis and moderate mitral regurgitation.  Moderate circumferential pericardial effusion without tamponade was also noted.  Diagnostic catheterization showed mild proximal RCA disease and no significant stenoses.  She did require left-sided thoracentesis during hospitalization.  Since discharge, she has done well.  She is staying in a skilled nursing facility working with physical therapy without chest pain or dyspnea.  She is euvolemic on examination today.  She remains on beta-blocker, ARB, and Lasix.  I am adding Farxiga 10 mg daily today.  We did discuss potentially switching her from ARB to [facility] however, she is on only losartan 12.5 mg daily and her blood pressure has been soft at that nursing  facility and is only 116/80 today and thus I am not sure that she would tolerate Entresto at this time.  I will follow-up basic metabolic panel today.  2.  Moderate circumferential pericardial effusion: No rub on exam.  No current dyspnea.  I will arrange for follow-up limited echo.  3.  Bilateral pleural effusions: Status post left-sided thoracentesis during hospitalization.  Lungs are clear today and she denies dyspnea.  4.  Essential hypertension: Stable on beta-blocker and low-dose ARB therapy.  5.  Hyperlipidemia: LDL of 47 in 2021.  This is followed by her primary care provider and she is on a statin.  6.  Nonobstructive CAD: Status post recent catheterization with nonobstructive RCA disease and otherwise minor irregularities.  She is on low-dose aspirin, beta-blocker, and statin.  7.  Stage II chronic kidney disease: Stable during recent hospitalization.  Plan for follow-up basic metabolic panel today in the setting of ongoing ARB and furosemide usage.  8.  Nausea/vomiting: In setting of eating very fast with prior history of GERD and esophageal surgery in the past.  She says that as soon as they put her tray down at the nursing home, they were back to pick it up and so she has been trying to eat food quickly.  She will need more time for meals and potentially GI follow-up if symptoms persist.  9.  Disposition: Follow-up basic metabolic panel today.  Follow-up limited echocardiogram to reevaluate  pericardial effusion.  Follow-up in clinic in 1 month or sooner if necessary.   Murray Hodgkins, NP 09/11/2021, 11:15 AM

## 2021-09-11 NOTE — Patient Instructions (Signed)
Medication Instructions:  Your physician has recommended you make the following change in your medication:  1.Start Farxiga 10 mg, take one tablet by mouth daily *If you need a refill on your cardiac medications before your next appointment, please call your pharmacy*   Lab Work: BMET today If you have labs (blood work) drawn today and your tests are completely normal, you will receive your results only by: [place] (if you have MyChart) OR A paper copy in the mail If you have any lab test that is abnormal or we need to change your treatment, we will call you to review the results.   Testing/Procedures: Your physician has requested that you have an echocardiogram prior to your 1 month follow up appointment. Echocardiography is a painless test that uses sound waves to create images of your heart. It provides your doctor with information about the size and shape of your heart and how well your hearts chambers and valves are working. This procedure takes approximately one hour. There are no restrictions for this procedure.    Follow-Up: At [facility], you and your health needs are our priority.  As part of our continuing mission to provide you with exceptional heart care, we have created designated Provider Care Teams.  These Care Teams include your primary Cardiologist (physician) and Advanced Practice Providers (APPs -  Physician Assistants and Nurse Practitioners) who all work together to provide you with the care you need, when you need it.   Your next appointment:   1 month(s)  The format for your next appointment:   In Person  Provider:   Nelva Bush, MD or Murray Hodgkins, NP{

## 2021-09-12 DIAGNOSIS — M6281 Muscle weakness (generalized): Secondary | ICD-10-CM | POA: Diagnosis not present

## 2021-09-12 DIAGNOSIS — R531 Weakness: Secondary | ICD-10-CM | POA: Diagnosis not present

## 2021-09-12 DIAGNOSIS — R2681 Unsteadiness on feet: Secondary | ICD-10-CM | POA: Diagnosis not present

## 2021-09-12 DIAGNOSIS — R279 Unspecified lack of coordination: Secondary | ICD-10-CM | POA: Diagnosis not present

## 2021-09-12 DIAGNOSIS — R2689 Other abnormalities of gait and mobility: Secondary | ICD-10-CM | POA: Diagnosis not present

## 2021-09-12 LAB — BASIC METABOLIC PANEL
BUN/Creatinine Ratio: 20 (ref 12–28)
BUN: 20 mg/dL (ref 8–27)
CO2: 24 mmol/L (ref 20–29)
Calcium: 9.2 mg/dL (ref 8.7–10.3)
Chloride: 102 mmol/L (ref 96–106)
Creatinine, Ser: 1.02 mg/dL — ABNORMAL HIGH (ref 0.57–1.00)
Glucose: 98 mg/dL (ref 70–99)
Potassium: 4.7 mmol/L (ref 3.5–5.2)
Sodium: 141 mmol/L (ref 134–144)
eGFR: 57 mL/min/{1.73_m2} — ABNORMAL LOW (ref >59)

## 2021-09-12 NOTE — Progress Notes (Signed)
Patient ID: Kimberly Cook, female    DOB: 1945-10-08, 76 y.o.   MRN: [id number]  HPI  [name] is a 76 y/o female with a history of CAD, hyperlipidemia, HTN, CKD, stroke, syncope, thyroid disease, anxiety, DDD, depression, diverticulosis, GERD, occipital neuralgia, vitamin D deficiency and chronic heart failure.   Echo report from 08/26/21 reviewed and showed an EF of 30-35% along with moderate LAE and moderate MR.   RHC/LHC done 08/28/21 and showed: Prox RCA lesion is 20% stenosed.   1.  Mild nonobstructive coronary artery disease. 2.  Left ventricular angiography was not performed.  EF was 30 to 35% by echo. 3.  Right heart catheterization showed mildly elevated right and left filling pressures, mild pulmonary hypertension and mildly reduced cardiac output.  Admitted 08/25/21 due to shortness of breath and weight gain. Initially given IV lasix with transition to oral diuretics. Cardiology consult obtained. Left thoracentesis done with removal of 548m. BP parameters given for holding losartan, carvedilol and furosemide. PT/OT evaluations done. Discharged after 5 days to SNF.   She presents today for her initial visit with a chief complaint of minimal fatigue upon moderate exertion. She describes this as chronic in nature. She has associated cough, shortness of breath, chronic back pain and depression along with this. She denies dizziness, difficulty sleeping, abdominal distention, palpitations, pedal edema or chest pain.   Says that she's currently not getting weighed at the facility and she's concerned about her living arrangements once she's released from facility. [facility].   She saw cardiology on 09/11/21 and [facility] and they say that they have no order for this.   Past  Medical History:  Diagnosis Date   Allergy    Anxiety    Arthritis    Back pain    CHF (congestive heart failure) (HCC)    [place]ronic [place] ([place]) [place]    a. 08/2021 echo: EF 30-35%, glob HK, GrI DD.   CKD (chronic kidney disease), stage II    Coronary artery disease    a. Mild to moderate CAD in LAD/diagonal by CTA (CT-FFR of apical LAD 0.79); b. 08/2021 Cath: LM nl, LAD min irregs, D1/2/3 nl, LCX nl, OM2/3 nl, RCA 20p, RPDA mild dzs, RPAV nl.   DDD (degenerative disc disease), lumbar    Depression    Diverticulosis    Essential hypertension    GERD (gastroesophageal reflux disease)    Headache    History of shingles    Hyperlipidemia    Hypothyroidism    Lung nodule    Mini stroke 2011   Moderate Pericardial effusion    a. 08/2021 Echo: moderate circumferential pericardial effusion w/o tamponade.   [place]    a. 08/2021 Echo: EF 30-35%, glob HK, GrI DD, nl RV fxn, mild-mod dil LA, mod circumferential pericardial eff w/o tamponade, Mod MR.   Occipital neuralgia    Palpitations    Pleural effusion on left    a. 08/2021 s/p thoracentesis.   Pneumonia 2018   Prediabetes    [facility]    [place]    MRI 04/2008 + left sup. frontal gyrus possibly puntate infarct    Syncope 2019   Urinary tract infection    Vitamin  D deficiency    Past Surgical History:  Procedure Laterality Date   ABDOMINAL HYSTERECTOMY     BLADDER SURGERY     2003   BREAST EXCISIONAL BIOPSY Right Over 20 years    Benign   CHOLECYSTECTOMY     gastroplication      JOINT REPLACEMENT Left    KNEE   KNEE ARTHROSCOPY Left 2011   PULSE GENERATOR IMPLANT Left 01/31/2020   Procedure: PLACEMENT RIGHT FLANK PULSE GENERATOR VS REMOVAL SPINAL CORD STIMULATOR;  Surgeon: Deetta Perla, MD;  Location: [facility] ORS;  Service: Neurosurgery;  Laterality: Left;   PULSE GENERATOR IMPLANT Left 04/24/2020   Procedure: REPLACEMENT LEFT FLANK PULSE GENERATOR  IMPLANT;  Surgeon: Deetta Perla, MD;  Location: [facility] ORS;  Service: Neurosurgery;  Laterality: Left;  MAC w/ local   right arm fracture     RIGHT/LEFT HEART CATH AND CORONARY ANGIOGRAPHY N/A 08/28/2021   Procedure: RIGHT/LEFT HEART CATH AND CORONARY ANGIOGRAPHY;  Surgeon: Wellington Hampshire, MD;  Location: [place] [facility];  Service: Cardiovascular;  Laterality: N/A;   SPINAL CORD STIMULATOR REMOVAL N/A 06/26/2020   Procedure: SPINAL CORD STIMULATOR REMOVAL;  Surgeon: Deetta Perla, MD;  Location: [facility] ORS;  Service: Neurosurgery;  Laterality: N/A;   THORACIC LAMINECTOMY FOR SPINAL CORD STIMULATOR N/A 01/24/2020   Procedure: THORACIC SPINAL CORD STIMULATOR PADDLE TRIAL VIA LAMINECTOMY;  Surgeon: Deetta Perla, MD;  Location: [facility] ORS;  Service: Neurosurgery;  Laterality: N/A;   TONSILLECTOMY AND ADENOIDECTOMY     TOTAL KNEE ARTHROPLASTY Left 04/17/2015   Procedure: LEFT TOTAL KNEE ARTHROPLASTY;  Surgeon: Paralee Cancel, MD;  Location: [facility];  Service: Orthopedics;  Laterality: Left;   Family History  Problem Relation Age of Onset   Heart disease Mother    Hypertension Mother    Diabetes Mother    Heart attack Mother 34   Heart disease Father    Heart attack Father 71   Breast cancer Maternal Aunt    Heart attack Brother    Social History   Tobacco Use   Smoking status: Never    Passive exposure: Yes   Smokeless tobacco: Never   Tobacco comments:    husbands and children smoked in home.   Substance Use Topics   Alcohol use: No   No Known Allergies Prior to Admission medications   Medication Sig Start Date End Date Taking? Authorizing Provider  vitamin B-12 (CYANOCOBALAMIN) 1000 MCG tablet Take 1 tablet (1,000 mcg total) by mouth daily. 10/09/18  Yes McLean-Scocuzza, Nino Glow, MD  acetaminophen (TYLENOL) 500 MG tablet Take 1,000 mg by mouth every 8 (eight) hours as needed for moderate pain.    [provider]  alendronate (FOSAMAX) 70 MG tablet Take 70 mg by mouth once a week.   10/23/19   [provider]  ALPRAZolam Duanne Moron) 0.5 MG tablet Take 1 tablet (0.5 mg total) by mouth 2 (two) times daily as needed. 08/30/21   Ezekiel Slocumb, DO  ascorbic acid (VITAMIN C) 500 MG tablet Take 500 mg by mouth daily.    [provider]  aspirin EC 81 MG tablet Take 1 tablet (81 mg total) by mouth at bedtime. Swallow whole. 10/25/20   McLean-Scocuzza, Nino Glow, MD  atorvastatin (LIPITOR) 20 MG tablet Take 1 tablet (20 mg total) by mouth daily. 06/20/20   McLean-Scocuzza, Nino Glow, MD  Biotin 1 MG CAPS Take 1 mg by mouth daily.     [provider]  carvedilol (COREG) 3.125 MG tablet Take 1 tablet (3.125  mg total) by mouth 2 (two) times daily with a meal. Hold if SBP<110 or DBP<60 08/30/21   Nicole Kindred A, DO  cetirizine (ZYRTEC) 10 MG tablet Take 10 mg by mouth every morning.    [provider]  Cholecalciferol (VITAMIN D3) 125 MCG (5000 UT) TABS Take 1 tablet (5,000 Units total) by mouth daily. 10/09/18   McLean-Scocuzza, Nino Glow, MD  Cranberry 450 MG CAPS Take 1 capsule by mouth daily.    [provider]  dapagliflozin propanediol (FARXIGA) 10 MG TABS tablet Take 1 tablet (10 mg total) by mouth daily before breakfast. 09/11/21   Theora Gianotti, NP  escitalopram (LEXAPRO) 5 MG tablet Take 1 tablet (5 mg total) by mouth every morning. 06/23/20   McLean-Scocuzza, Nino Glow, MD  folic acid (FOLVITE) 389 MCG tablet Take 1 tablet (400 mcg total) by mouth daily. SEPARATE ALL SUPPLEMENTS TO LUNCH OR DINNER AND PRILOSEC NOT TO MESS W/THYROID MED 12/10/18   McLean-Scocuzza, Nino Glow, MD  furosemide (LASIX) 40 MG tablet Take 1 tablet (40 mg total) by mouth daily. Hold if SBP<110 or DBP<60 08/31/21   Ezekiel Slocumb, DO  levothyroxine (SYNTHROID) 88 MCG tablet Take 1 tablet (88 mcg total) by mouth daily before breakfast. Skip sundays 12/02/19   McLean-Scocuzza, Nino Glow, MD  losartan (COZAAR) 25 MG tablet Take 0.5 tablets (12.5 mg total) by mouth daily.  Hold if SBP<110 or DBP<60 08/31/21   Nicole Kindred A, DO  magnesium oxide (MAG-OX) 400 (240 Mg) MG tablet Take 1 tablet (400 mg total) by mouth daily. 08/31/21   Nicole Kindred A, DO  melatonin 5 MG TABS Take 1 tablet (5 mg total) by mouth at bedtime. 04/04/21   Val Riles, MD  montelukast (SINGULAIR) 10 MG tablet Take 1 tablet (10 mg total) by mouth at bedtime. 02/14/20   McLean-Scocuzza, Nino Glow, MD  Multiple Vitamin (MULTIVITAMIN WITH MINERALS) TABS tablet Take 1 tablet by mouth daily.    [provider]  MYRBETRIQ 50 MG TB24 tablet TAKE ONE TABLET EVERY DAY 10/20/20   Debroah Loop, PA-C  omeprazole (PRILOSEC) 40 MG capsule Take 1 capsule (40 mg total) by mouth daily. After lunch 06/23/20   McLean-Scocuzza, Nino Glow, MD  polyethylene glycol powder (GLYCOLAX/MIRALAX) 17 GM/SCOOP powder Take 17 g by mouth daily as needed for moderate constipation. 10/21/19   McLean-Scocuzza, Nino Glow, MD  potassium chloride SA (KLOR-CON M) 20 MEQ tablet Take 1 tablet (20 mEq total) by mouth 2 (two) times daily. 08/30/21   Ezekiel Slocumb, DO  pregabalin (LYRICA) 75 MG capsule Take 75 mg by mouth 3 (three) times daily.    [provider]  traZODone (DESYREL) 50 MG tablet TAKE 2 TABLETS EVERY DAY 1 HOUR BEFORE BED 09/11/20   McLean-Scocuzza, Nino Glow, MD   Review of Systems  Constitutional:  Positive for fatigue. Negative for appetite change.  HENT:  Negative for congestion, postnasal drip and sore throat.   Eyes: Negative.   Respiratory:  Positive for cough (dry) and shortness of breath. Negative for chest tightness.   Cardiovascular:  Negative for chest pain, palpitations and leg swelling.  Gastrointestinal:  Negative for abdominal distention and abdominal pain.  Endocrine: Negative.   Genitourinary: Negative.   Musculoskeletal:  Positive for back pain (chronic due to 4 rods in back). Negative for neck pain.  Skin: Negative.   Allergic/Immunologic: Negative.   Neurological:  Negative  for dizziness and light-headedness.  Hematological:  Negative for adenopathy. Does not bruise/bleed easily.  Psychiatric/Behavioral:  Positive for dysphoric mood. Negative for sleep disturbance (sleeping on 1 pillow). The patient is not nervous/anxious.  /  Vitals:   09/13/21 0908  BP: (!) 104/50  Pulse: 67  Resp: 16  SpO2: 98%  Height: _0  (1.626 m)   Wt Readings from Last 3 Encounters:  09/11/21 159 lb (72.1 kg)  08/29/21 164 lb 10.9 oz (74.7 kg)  07/06/21 157 lb (71.2 kg)   Lab Results  Component Value Date   CREATININE 1.02 (H) 09/11/2021   CREATININE 0.82 08/30/2021   CREATININE 0.90 08/29/2021   Physical Exam Vitals and nursing note reviewed.  Constitutional:      Appearance: She is well-developed.  HENT:     Head: Normocephalic and atraumatic.  Cardiovascular:     Rate and Rhythm: Normal rate and regular rhythm.  Pulmonary:     Effort: Pulmonary effort is normal. No respiratory distress.     Breath sounds: No wheezing or rales.  Abdominal:     Palpations: Abdomen is soft.     Tenderness: There is no abdominal tenderness.  Musculoskeletal:     Cervical back: Normal range of motion and neck supple.     Right lower leg: Edema (trace pitting) present.     Left lower leg: No tenderness. Edema (trace pitting) present.  Skin:    General: Skin is warm and dry.  Neurological:     General: No focal deficit present.     Mental Status: She is alert and oriented to person, place, and time.  Psychiatric:        Mood and Affect: Mood normal.        Behavior: Behavior normal.    Assessment & Plan:  1: Chronic heart failure with reduced ejection fraction- - NYHA class II - euvolemic today - order written for patient to be weighed daily and for facility to call for an overnight weight gain of > 2 pounds or a weekly weight gain of >5 pounds - currently not adding salt except to grits; reviewed the importance of not adding any salt to her food if possible - on GDMT of  carvedilol & losartan - farxiga was started by cardiology on 09/11/21 but facility does not have order; order written for farxiga 83m once daily - has upcoming echo scheduled on 10/05/21 - BNP 08/25/21 was 353.9  2: HTN- - BP looks good today (104/50) - [facility] and showed sodium 141, potassium 4.7, creatinine 1.02 and GFR 57  3: CAD- - saw cardiology (Sharolyn Douglas 09/11/21; returns 10/17/21   Facility medication list reviewed.   Return in 2 months sooner if needed.

## 2021-09-13 ENCOUNTER — Encounter: Payer: Self-pay | Admitting: Family

## 2021-09-13 ENCOUNTER — Ambulatory Visit: Payer: Medicare HMO | Attending: Family | Admitting: Family

## 2021-09-13 ENCOUNTER — Other Ambulatory Visit: Payer: Self-pay

## 2021-09-13 ENCOUNTER — Encounter: Payer: Self-pay | Admitting: Pharmacist

## 2021-09-13 VITALS — BP 104/50 | HR 67 | Resp 16 | Ht 64.0 in

## 2021-09-13 DIAGNOSIS — M5481 Occipital neuralgia: Secondary | ICD-10-CM | POA: Diagnosis not present

## 2021-09-13 DIAGNOSIS — Z7984 Long term (current) use of oral hypoglycemic drugs: Secondary | ICD-10-CM | POA: Diagnosis not present

## 2021-09-13 DIAGNOSIS — M5136 Other intervertebral disc degeneration, lumbar region: Secondary | ICD-10-CM | POA: Insufficient documentation

## 2021-09-13 DIAGNOSIS — I340 Nonrheumatic mitral (valve) insufficiency: Secondary | ICD-10-CM | POA: Insufficient documentation

## 2021-09-13 DIAGNOSIS — Z79899 Other long term (current) drug therapy: Secondary | ICD-10-CM | POA: Diagnosis not present

## 2021-09-13 DIAGNOSIS — M6281 Muscle weakness (generalized): Secondary | ICD-10-CM | POA: Diagnosis not present

## 2021-09-13 DIAGNOSIS — F419 Anxiety disorder, unspecified: Secondary | ICD-10-CM | POA: Insufficient documentation

## 2021-09-13 DIAGNOSIS — K219 Gastro-esophageal reflux disease without esophagitis: Secondary | ICD-10-CM | POA: Insufficient documentation

## 2021-09-13 DIAGNOSIS — I10 Essential (primary) hypertension: Secondary | ICD-10-CM

## 2021-09-13 DIAGNOSIS — R55 Syncope and collapse: Secondary | ICD-10-CM | POA: Diagnosis not present

## 2021-09-13 DIAGNOSIS — I2510 Atherosclerotic heart disease of native coronary artery without angina pectoris: Secondary | ICD-10-CM | POA: Diagnosis not present

## 2021-09-13 DIAGNOSIS — R279 Unspecified lack of coordination: Secondary | ICD-10-CM | POA: Diagnosis not present

## 2021-09-13 DIAGNOSIS — I130 Hypertensive heart and chronic kidney disease with heart failure and stage 1 through stage 4 chronic kidney disease, or unspecified chronic kidney disease: Secondary | ICD-10-CM | POA: Insufficient documentation

## 2021-09-13 DIAGNOSIS — R2681 Unsteadiness on feet: Secondary | ICD-10-CM | POA: Diagnosis not present

## 2021-09-13 DIAGNOSIS — N182 Chronic kidney disease, stage 2 (mild): Secondary | ICD-10-CM | POA: Diagnosis not present

## 2021-09-13 DIAGNOSIS — I272 Pulmonary hypertension, unspecified: Secondary | ICD-10-CM | POA: Insufficient documentation

## 2021-09-13 DIAGNOSIS — Z8673 Personal history of transient ischemic attack (TIA), and cerebral infarction without residual deficits: Secondary | ICD-10-CM | POA: Insufficient documentation

## 2021-09-13 DIAGNOSIS — E079 Disorder of thyroid, unspecified: Secondary | ICD-10-CM | POA: Diagnosis not present

## 2021-09-13 DIAGNOSIS — R531 Weakness: Secondary | ICD-10-CM | POA: Diagnosis not present

## 2021-09-13 DIAGNOSIS — E559 Vitamin D deficiency, unspecified: Secondary | ICD-10-CM | POA: Insufficient documentation

## 2021-09-13 DIAGNOSIS — F32A Depression, unspecified: Secondary | ICD-10-CM | POA: Diagnosis not present

## 2021-09-13 DIAGNOSIS — I5022 Chronic systolic (congestive) heart failure: Secondary | ICD-10-CM | POA: Insufficient documentation

## 2021-09-13 DIAGNOSIS — R2689 Other abnormalities of gait and mobility: Secondary | ICD-10-CM | POA: Diagnosis not present

## 2021-09-13 NOTE — Progress Notes (Signed)
[place] - PHARMACIST COUNSELING NOTE  *HFrEF*  Guideline-Directed Medical Therapy/Evidence Based Medicine  ACE/ARB/ARNI: Losartan 12.5 mg daily Beta Blocker: Carvedilol 3.125 mg twice daily Aldosterone Antagonist:  n/a Diuretic: Furosemide 40 mg daily SGLT2i: Dapagliflozin 10 mg daily - started 09/11/2021 by Laban Emperor NP  Adherence Assessment  Do you ever forget to take your medication? [] Yes [x] No  Do you ever skip doses due to side effects? [] Yes [x] No  Do you have trouble affording your medicines? [] Yes [x] No  Are you ever unable to pick up your medication due to transportation difficulties? [] Yes [x] No  Do you ever stop taking your medications because you don't believe they are helping? [] Yes [x] No  Do you check your weight daily? [x] Yes [] No   Adherence strategy: none (currently @ SNF)  Barriers to obtaining medications: none  Vital signs: HR 67, BP 104/50, weight (pounds) 159 lbs ECHO: Date 08/26/21, EF 30-35%, notes global hypokinesis  BMP Latest Ref Rng & Units 09/11/2021 08/30/2021 08/29/2021  Glucose 70 - 99 mg/dL 98 96 84  BUN 8 - 27 mg/dL 20 22 20   Creatinine 0.57 - 1.00 mg/dL 1.02(H) 0.82 0.90  BUN/Creat Ratio 12 - 28 20 - -  Sodium 134 - 144 mmol/L 141 137 134(L)  Potassium 3.5 - 5.2 mmol/L 4.7 4.3 4.3  Chloride 96 - 106 mmol/L 102 100 97(L)  CO2 20 - 29 mmol/L 24 32 28  Calcium 8.7 - 10.3 mg/dL 9.2 9.1 8.6(L)    Past Medical History:  Diagnosis Date   Allergy    Anxiety    Arthritis    Back pain    Chronic HFrEF (heart failure with reduced ejection fraction) ([place])    a. 08/2021 echo: EF 30-35%, glob HK, GrI DD.   CKD (chronic kidney disease), stage II    Coronary artery disease    a. Mild to moderate CAD in LAD/diagonal by CTA (CT-FFR of apical LAD 0.79); b. 08/2021 Cath: LM nl, LAD min irregs, D1/2/3 nl, LCX nl, OM2/3 nl, RCA 20p, RPDA mild dzs, RPAV nl.   DDD (degenerative disc disease), lumbar     Depression    Diverticulosis    Essential hypertension    GERD (gastroesophageal reflux disease)    Headache    History of shingles    Hyperlipidemia    Hypothyroidism    Lung nodule    Mini stroke 2011   Moderate Pericardial effusion    a. 08/2021 Echo: moderate circumferential pericardial effusion w/o tamponade.   NICM (nonischemic cardiomyopathy) ([place])    a. 08/2021 Echo: EF 30-35%, glob HK, GrI DD, nl RV fxn, mild-mod dil LA, mod circumferential pericardial eff w/o tamponade, Mod MR.   Occipital neuralgia    Palpitations    Pleural effusion on left    a. 08/2021 s/p thoracentesis.   Pneumonia 2018   Prediabetes    Stroke [facility])    Stroke ([place])    MRI 04/2008 + left sup. frontal gyrus possibly puntate infarct    Syncope 2019   Urinary tract infection    Vitamin D deficiency     ASSESSMENT 76 year old female who presents to the HF clinic for follow up. PMH relevant for CAD, HTN, HLD, hypothyroidism, stroke, GERD, CKD, depression, and anxiety. Noted most recent follow up with cardiology Laban Emperor - NP) was only 2 days ago. [name] was initiated by cardiology during mist recent visit.  While completing medication reconciliation with SNF MAR I noticed [name] was absence  from medication list. Confirmed with   Recent ED Visit (past 6 months): Date - 08/25/21, [facility] - chest pain  PLAN  Recommendations: Start Farxiga 10mg  daily as prescribed by cardiology Doubtful patient will be able to tolerate Entresto d/t low BP Consider adding low dose spironolactone and d/c Kcl during follow up.   Time spent: 15 minutes  Landrey Mahurin Rodriguez-Guzman PharmD, BCPS 09/13/2021 12:12 PM    Current Outpatient Medications:    acetaminophen (TYLENOL) 500 MG tablet, Take 1,000 mg by mouth every 8 (eight) hours as needed for moderate pain., Disp: , Rfl:    alendronate (FOSAMAX) 70 MG tablet, Take 70 mg by mouth once a week. , Disp: , Rfl:    ALPRAZolam (XANAX) 0.5 MG tablet, Take 1 tablet (0.5 mg total)  by mouth 2 (two) times daily as needed., Disp: 30 tablet, Rfl: 0   ascorbic acid (VITAMIN C) 500 MG tablet, Take 500 mg by mouth daily., Disp: , Rfl:    aspirin EC 81 MG tablet, Take 1 tablet (81 mg total) by mouth at bedtime. Swallow whole., Disp: 90 tablet, Rfl: 3   atorvastatin (LIPITOR) 20 MG tablet, Take 1 tablet (20 mg total) by mouth daily., Disp: 90 tablet, Rfl: 3   Biotin 1 MG CAPS, Take 1 mg by mouth daily. , Disp: , Rfl:    carvedilol (COREG) 3.125 MG tablet, Take 1 tablet (3.125 mg total) by mouth 2 (two) times daily with a meal. Hold if SBP<110 or DBP<60, Disp: , Rfl:    cetirizine (ZYRTEC) 10 MG tablet, Take 10 mg by mouth every morning., Disp: , Rfl:    Cholecalciferol (VITAMIN D3) 125 MCG (5000 UT) TABS, Take 1 tablet (5,000 Units total) by mouth daily., Disp: 90 tablet, Rfl: 3   Cranberry 450 MG CAPS, Take 1 capsule by mouth daily., Disp: , Rfl:    dapagliflozin propanediol (FARXIGA) 10 MG TABS tablet, Take 1 tablet (10 mg total) by mouth daily before breakfast., Disp: 30 tablet, Rfl: 6   escitalopram (LEXAPRO) 5 MG tablet, Take 1 tablet (5 mg total) by mouth every morning., Disp: 90 tablet, Rfl: 3   folic acid (FOLVITE) A999333 MCG tablet, Take 1 tablet (400 mcg total) by mouth daily. SEPARATE ALL SUPPLEMENTS TO LUNCH OR DINNER AND PRILOSEC NOT TO MESS W/THYROID MED, Disp: 90 tablet, Rfl: 3   furosemide (LASIX) 40 MG tablet, Take 1 tablet (40 mg total) by mouth daily. Hold if SBP<110 or DBP<60, Disp: 30 tablet, Rfl:    levothyroxine (SYNTHROID) 88 MCG tablet, Take 1 tablet (88 mcg total) by mouth daily before breakfast. Skip sundays, Disp: 90 tablet, Rfl: 3   lidocaine (LIDODERM) 5 %, Place 1-2 patches onto the skin daily as needed (pain). Apply patch to back at 8am, then remove at 8pm same day, Disp: , Rfl:    losartan (COZAAR) 25 MG tablet, Take 0.5 tablets (12.5 mg total) by mouth daily. Hold if SBP<110 or DBP<60, Disp: , Rfl:    magnesium oxide (MAG-OX) 400 (240 Mg) MG tablet, Take  1 tablet (400 mg total) by mouth daily., Disp: , Rfl:    melatonin 5 MG TABS, Take 1 tablet (5 mg total) by mouth at bedtime., Disp: , Rfl: 0   montelukast (SINGULAIR) 10 MG tablet, Take 1 tablet (10 mg total) by mouth at bedtime., Disp: 90 tablet, Rfl: 3   Multiple Vitamin (MULTIVITAMIN WITH MINERALS) TABS tablet, Take 1 tablet by mouth daily., Disp: , Rfl:    MYRBETRIQ 50 MG TB24 tablet,  TAKE ONE TABLET EVERY DAY, Disp: 30 tablet, Rfl: 11   omeprazole (PRILOSEC) 40 MG capsule, Take 1 capsule (40 mg total) by mouth daily. After lunch, Disp: 90 capsule, Rfl: 3   polyethylene glycol powder (GLYCOLAX/MIRALAX) 17 GM/SCOOP powder, Take 17 g by mouth daily as needed for moderate constipation., Disp: 255 g, Rfl: 11   potassium chloride SA (KLOR-CON M) 20 MEQ tablet, Take 1 tablet (20 mEq total) by mouth 2 (two) times daily., Disp: , Rfl:    pregabalin (LYRICA) 75 MG capsule, Take 75 mg by mouth 3 (three) times daily., Disp: , Rfl:    traZODone (DESYREL) 50 MG tablet, TAKE 2 TABLETS EVERY DAY 1 HOUR BEFORE BED, Disp: 180 tablet, Rfl: 1   vitamin B-12 (CYANOCOBALAMIN) 1000 MCG tablet, Take 1 tablet (1,000 mcg total) by mouth daily., Disp: 90 tablet, Rfl: 3   MEDICATION ADHERENCES TIPS AND STRATEGIES Taking medication as prescribed improves patient outcomes in heart failure (reduces hospitalizations, improves symptoms, increases survival) Side effects of medications can be managed by decreasing doses, switching agents, stopping drugs, or adding additional therapy. Please let someone in the [facility] know if you have having bothersome side effects so we can modify your regimen. Do not alter your medication regimen without talking to [place].  Medication reminders can help patients remember to take drugs on time. If you are missing or forgetting doses you can try linking behaviors, using pill boxes, or an electronic reminder like an alarm on your phone or an app. Some people can also get automated phone  calls as medication reminders.

## 2021-09-13 NOTE — Patient Instructions (Addendum)
Begin weighing daily and call for an overnight weight gain of 3 pounds or more or a weekly weight gain of more than 5 pounds.   The [facility] will be moving around the corner to [id number] mid-February. Our phone number will remain the same

## 2021-09-14 DIAGNOSIS — E538 Deficiency of other specified B group vitamins: Secondary | ICD-10-CM | POA: Diagnosis not present

## 2021-09-14 DIAGNOSIS — G629 Polyneuropathy, unspecified: Secondary | ICD-10-CM | POA: Diagnosis not present

## 2021-09-14 DIAGNOSIS — M4800 Spinal stenosis, site unspecified: Secondary | ICD-10-CM | POA: Diagnosis not present

## 2021-09-14 DIAGNOSIS — J9610 Chronic respiratory failure, unspecified whether with hypoxia or hypercapnia: Secondary | ICD-10-CM | POA: Diagnosis not present

## 2021-09-14 DIAGNOSIS — I110 Hypertensive heart disease with heart failure: Secondary | ICD-10-CM | POA: Diagnosis not present

## 2021-09-17 DIAGNOSIS — R2689 Other abnormalities of gait and mobility: Secondary | ICD-10-CM | POA: Diagnosis not present

## 2021-09-17 DIAGNOSIS — R531 Weakness: Secondary | ICD-10-CM | POA: Diagnosis not present

## 2021-09-17 DIAGNOSIS — R279 Unspecified lack of coordination: Secondary | ICD-10-CM | POA: Diagnosis not present

## 2021-09-17 DIAGNOSIS — R2681 Unsteadiness on feet: Secondary | ICD-10-CM | POA: Diagnosis not present

## 2021-09-17 DIAGNOSIS — M6281 Muscle weakness (generalized): Secondary | ICD-10-CM | POA: Diagnosis not present

## 2021-09-18 DIAGNOSIS — R279 Unspecified lack of coordination: Secondary | ICD-10-CM | POA: Diagnosis not present

## 2021-09-18 DIAGNOSIS — R2681 Unsteadiness on feet: Secondary | ICD-10-CM | POA: Diagnosis not present

## 2021-09-18 DIAGNOSIS — M6281 Muscle weakness (generalized): Secondary | ICD-10-CM | POA: Diagnosis not present

## 2021-09-18 DIAGNOSIS — R531 Weakness: Secondary | ICD-10-CM | POA: Diagnosis not present

## 2021-09-18 DIAGNOSIS — R2689 Other abnormalities of gait and mobility: Secondary | ICD-10-CM | POA: Diagnosis not present

## 2021-09-19 DIAGNOSIS — M6281 Muscle weakness (generalized): Secondary | ICD-10-CM | POA: Diagnosis not present

## 2021-09-19 DIAGNOSIS — R279 Unspecified lack of coordination: Secondary | ICD-10-CM | POA: Diagnosis not present

## 2021-09-19 DIAGNOSIS — R2689 Other abnormalities of gait and mobility: Secondary | ICD-10-CM | POA: Diagnosis not present

## 2021-09-19 DIAGNOSIS — R531 Weakness: Secondary | ICD-10-CM | POA: Diagnosis not present

## 2021-09-19 DIAGNOSIS — R2681 Unsteadiness on feet: Secondary | ICD-10-CM | POA: Diagnosis not present

## 2021-09-20 DIAGNOSIS — R279 Unspecified lack of coordination: Secondary | ICD-10-CM | POA: Diagnosis not present

## 2021-09-20 DIAGNOSIS — R2681 Unsteadiness on feet: Secondary | ICD-10-CM | POA: Diagnosis not present

## 2021-09-20 DIAGNOSIS — R2689 Other abnormalities of gait and mobility: Secondary | ICD-10-CM | POA: Diagnosis not present

## 2021-09-20 DIAGNOSIS — M6281 Muscle weakness (generalized): Secondary | ICD-10-CM | POA: Diagnosis not present

## 2021-09-20 DIAGNOSIS — R531 Weakness: Secondary | ICD-10-CM | POA: Diagnosis not present

## 2021-09-21 DIAGNOSIS — R531 Weakness: Secondary | ICD-10-CM | POA: Diagnosis not present

## 2021-09-21 DIAGNOSIS — M6281 Muscle weakness (generalized): Secondary | ICD-10-CM | POA: Diagnosis not present

## 2021-09-21 DIAGNOSIS — G4700 Insomnia, unspecified: Secondary | ICD-10-CM | POA: Diagnosis not present

## 2021-09-21 DIAGNOSIS — G629 Polyneuropathy, unspecified: Secondary | ICD-10-CM | POA: Diagnosis not present

## 2021-09-21 DIAGNOSIS — M4800 Spinal stenosis, site unspecified: Secondary | ICD-10-CM | POA: Diagnosis not present

## 2021-09-21 DIAGNOSIS — R2689 Other abnormalities of gait and mobility: Secondary | ICD-10-CM | POA: Diagnosis not present

## 2021-09-21 DIAGNOSIS — R279 Unspecified lack of coordination: Secondary | ICD-10-CM | POA: Diagnosis not present

## 2021-09-21 DIAGNOSIS — R2681 Unsteadiness on feet: Secondary | ICD-10-CM | POA: Diagnosis not present

## 2021-09-21 DIAGNOSIS — E538 Deficiency of other specified B group vitamins: Secondary | ICD-10-CM | POA: Diagnosis not present

## 2021-09-24 DIAGNOSIS — R2681 Unsteadiness on feet: Secondary | ICD-10-CM | POA: Diagnosis not present

## 2021-09-24 DIAGNOSIS — R2689 Other abnormalities of gait and mobility: Secondary | ICD-10-CM | POA: Diagnosis not present

## 2021-09-24 DIAGNOSIS — M6281 Muscle weakness (generalized): Secondary | ICD-10-CM | POA: Diagnosis not present

## 2021-09-24 DIAGNOSIS — R531 Weakness: Secondary | ICD-10-CM | POA: Diagnosis not present

## 2021-09-24 DIAGNOSIS — R279 Unspecified lack of coordination: Secondary | ICD-10-CM | POA: Diagnosis not present

## 2021-09-25 DIAGNOSIS — R2681 Unsteadiness on feet: Secondary | ICD-10-CM | POA: Diagnosis not present

## 2021-09-25 DIAGNOSIS — M6281 Muscle weakness (generalized): Secondary | ICD-10-CM | POA: Diagnosis not present

## 2021-09-25 DIAGNOSIS — R279 Unspecified lack of coordination: Secondary | ICD-10-CM | POA: Diagnosis not present

## 2021-09-25 DIAGNOSIS — R2689 Other abnormalities of gait and mobility: Secondary | ICD-10-CM | POA: Diagnosis not present

## 2021-09-25 DIAGNOSIS — R531 Weakness: Secondary | ICD-10-CM | POA: Diagnosis not present

## 2021-09-26 DIAGNOSIS — R2689 Other abnormalities of gait and mobility: Secondary | ICD-10-CM | POA: Diagnosis not present

## 2021-09-26 DIAGNOSIS — M6281 Muscle weakness (generalized): Secondary | ICD-10-CM | POA: Diagnosis not present

## 2021-09-26 DIAGNOSIS — R531 Weakness: Secondary | ICD-10-CM | POA: Diagnosis not present

## 2021-09-26 DIAGNOSIS — R2681 Unsteadiness on feet: Secondary | ICD-10-CM | POA: Diagnosis not present

## 2021-09-26 DIAGNOSIS — R279 Unspecified lack of coordination: Secondary | ICD-10-CM | POA: Diagnosis not present

## 2021-09-27 DIAGNOSIS — E559 Vitamin D deficiency, unspecified: Secondary | ICD-10-CM | POA: Diagnosis not present

## 2021-10-05 ENCOUNTER — Other Ambulatory Visit: Payer: Self-pay

## 2021-10-05 ENCOUNTER — Ambulatory Visit (INDEPENDENT_AMBULATORY_CARE_PROVIDER_SITE_OTHER): Payer: Medicare HMO

## 2021-10-05 DIAGNOSIS — I3139 Other pericardial effusion (noninflammatory): Secondary | ICD-10-CM | POA: Diagnosis not present

## 2021-10-05 LAB — ECHOCARDIOGRAM LIMITED
Calc EF: 45.5 %
S' Lateral: 4.2 cm
Single Plane A2C EF: 51.8 %
Single Plane A4C EF: 36.9 %

## 2021-10-16 ENCOUNTER — Ambulatory Visit (INDEPENDENT_AMBULATORY_CARE_PROVIDER_SITE_OTHER): Payer: Medicare HMO | Admitting: Urology

## 2021-10-16 ENCOUNTER — Other Ambulatory Visit: Payer: Self-pay

## 2021-10-16 VITALS — BP 125/83 | HR 73 | Ht 65.0 in | Wt 163.0 lb

## 2021-10-16 DIAGNOSIS — K219 Gastro-esophageal reflux disease without esophagitis: Secondary | ICD-10-CM | POA: Diagnosis not present

## 2021-10-16 DIAGNOSIS — M419 Scoliosis, unspecified: Secondary | ICD-10-CM | POA: Diagnosis not present

## 2021-10-16 DIAGNOSIS — R3915 Urgency of urination: Secondary | ICD-10-CM | POA: Diagnosis not present

## 2021-10-16 DIAGNOSIS — K5900 Constipation, unspecified: Secondary | ICD-10-CM | POA: Diagnosis not present

## 2021-10-16 DIAGNOSIS — J309 Allergic rhinitis, unspecified: Secondary | ICD-10-CM | POA: Diagnosis not present

## 2021-10-16 DIAGNOSIS — N3281 Overactive bladder: Secondary | ICD-10-CM | POA: Diagnosis not present

## 2021-10-16 DIAGNOSIS — F32A Depression, unspecified: Secondary | ICD-10-CM | POA: Diagnosis not present

## 2021-10-16 DIAGNOSIS — G4700 Insomnia, unspecified: Secondary | ICD-10-CM | POA: Diagnosis not present

## 2021-10-16 DIAGNOSIS — E039 Hypothyroidism, unspecified: Secondary | ICD-10-CM | POA: Diagnosis not present

## 2021-10-16 DIAGNOSIS — E539 Vitamin B deficiency, unspecified: Secondary | ICD-10-CM | POA: Diagnosis not present

## 2021-10-16 LAB — BLADDER SCAN AMB NON-IMAGING

## 2021-10-16 NOTE — Progress Notes (Signed)
? ?10/16/2021 ?10:16 AM  ? ?Kimberly Cook ?[birth year] ?[id number] ? ?Referring provider: Virgie Dad, [place] [address] ?[address] ?[place],  [place] [address] ? ?Chief Complaint  ?Patient presents with  ? Over Active Bladder  ? ?Urological history: ?1. rUTI's ?-contributing factors of age, vaginal atrophy, constipation, poor perineal hygiene and incontinence ?-documented positive urine cultures over the last year ? 03/31/2021 Klebsiella pneumoniae ?-managed with cranberry tablets  ? ?2. OAB ?-contributing factors of age, vaginal atrophy, HTN, neuropathy, lumbar radiculopathy, CVA, diuretics and benzo's  ?-PVR 68 mL  ?-managed with Myrbetriq 50 mg daily  ?  ? ?HPI: ?Kimberly Cook is a 76 y.o. female who presents today for a yearly visit.  ? ?She is experiencing 1-7 daytime void, 1-2 night time urination and strong urge to urinate.   She is having urinary leakage 3 or more times daily and wears depends daily.   ? ?She does restrict fluid and engages in toilet mapping. ? ?Patient denies any modifying or aggravating factors.  Patient denies any gross hematuria, dysuria or suprapubic/flank pain.  Patient denies any fevers, chills, nausea or vomiting.   ? ?She only drinks Ginger Ale and she has a dry mouth.   Laying down seems to make the OAB worse and she is also taking Lasix for her pedal edema. ? ?PVR 68 mL ? ?She is unsure if the Myrbetriq is helping.   ? ?No UTI's since last visit.   ? ?PMH: ?Past Medical History:  ?Diagnosis Date  ? Allergy   ? Anxiety   ? Arthritis   ? Back pain   ? Chronic HFrEF (heart failure with reduced ejection fraction) ([place])   ? a. 08/2021 echo: EF 30-35%, glob HK, GrI DD; b. 10/2021 Echo: EF 30-35%.  ? CKD (chronic kidney disease), stage II   ? Coronary artery disease   ? a. Mild to moderate CAD in LAD/diagonal by CTA (CT-FFR of apical LAD 0.79); b. 08/2021 Cath: LM nl, LAD min irregs, D1/2/3 nl, LCX nl, OM2/3 nl, RCA 20p, RPDA mild dzs, RPAV nl.  ? DDD (degenerative disc disease),  lumbar   ? Depression   ? Diverticulosis   ? Essential hypertension   ? GERD (gastroesophageal reflux disease)   ? Headache   ? History of shingles   ? Hyperlipidemia   ? Hypothyroidism   ? Lung nodule   ? Mini stroke 2011  ? Moderate Pericardial effusion   ? a. 08/2021 Echo: moderate circumferential pericardial effusion w/o tamponade; b. 10/2021 Echo: EF 30-35%, small to mod circumferential pericardial effusion w/o tamponade.  ? NICM (nonischemic cardiomyopathy) ([place])   ? a. 08/2021 Echo: EF 30-35%, glob HK, GrI DD, nl RV fxn, mild-mod dil LA, mod circumferential pericardial eff w/o tamponade, Mod MR; b. 10/2021 Echo: EF 30-35%, glob HK.  ? Occipital neuralgia   ? Palpitations   ? Pleural effusion on left   ? a. 08/2021 s/p thoracentesis.  ? Pneumonia 2018  ? Prediabetes   ? Stroke [facility])   ? Stroke [facility])   ? MRI 04/2008 + left sup. frontal gyrus possibly puntate infarct   ? Syncope 2019  ? Urinary tract infection   ? Vitamin D deficiency   ? ? ?Surgical History: ?Past Surgical History:  ?Procedure Laterality Date  ? ABDOMINAL HYSTERECTOMY    ? BLADDER SURGERY    ? 2003  ? BREAST EXCISIONAL BIOPSY Right Over 20 years   ? Benign  ? CHOLECYSTECTOMY    ? gastroplication     ?  JOINT REPLACEMENT Left   ? KNEE  ? KNEE ARTHROSCOPY Left 2011  ? PULSE GENERATOR IMPLANT Left 01/31/2020  ? Procedure: PLACEMENT RIGHT FLANK PULSE GENERATOR VS REMOVAL SPINAL CORD STIMULATOR;  Surgeon: Deetta Perla, MD;  Location: [facility] ORS;  Service: Neurosurgery;  Laterality: Left;  ? PULSE GENERATOR IMPLANT Left 04/24/2020  ? Procedure: REPLACEMENT LEFT FLANK PULSE GENERATOR IMPLANT;  Surgeon: Deetta Perla, MD;  Location: [facility] ORS;  Service: Neurosurgery;  Laterality: Left;  MAC w/ local  ? right arm fracture    ? RIGHT/LEFT HEART CATH AND CORONARY ANGIOGRAPHY N/A 08/28/2021  ? Procedure: RIGHT/LEFT HEART CATH AND CORONARY ANGIOGRAPHY;  Surgeon: Wellington Hampshire, MD;  Location: [place] CV [facility];  Service: Cardiovascular;  Laterality: N/A;  ? SPINAL  CORD STIMULATOR REMOVAL N/A 06/26/2020  ? Procedure: SPINAL CORD STIMULATOR REMOVAL;  Surgeon: Deetta Perla, MD;  Location: [facility] ORS;  Service: Neurosurgery;  Laterality: N/A;  ? THORACIC LAMINECTOMY FOR SPINAL CORD STIMULATOR N/A 01/24/2020  ? Procedure: THORACIC SPINAL CORD STIMULATOR PADDLE TRIAL VIA LAMINECTOMY;  Surgeon: Deetta Perla, MD;  Location: [facility] OR[facility];  Service: Neurosurgery;  Laterality: N/A;  ? TONSILLECTOMY AND ADENOIDECTOMY    ? TOTAL KNEE ARTHROPLASTY Left 04/17/2015  ? Procedure: LEFT TOTAL KNEE ARTHROPLASTY;  Surgeon: Paralee Cancel, MD;  Location: [facility];  Service: Orthopedics;  Laterality: Left;  ? ? ?Home Medications:  ?Allergies as of 10/16/2021   ?No Known Allergies ?  ? ?  ?Medication List  ?  ? ?  ? Accurate as of October 16, 2021 11:59 PM. If you have any questions, ask your nurse or doctor.  ?  ?  ? ?  ? ?acetaminophen 500 MG tablet ?Commonly known as: TYLENOL ?Take 1,000 mg by mouth every 8 (eight) hours as needed for moderate pain. ?  ?alendronate 70 MG tablet ?Commonly known as: FOSAMAX ?Take 70 mg by mouth once a week. ?  ?ALPRAZolam 0.5 MG tablet ?Commonly known as: Duanne Moron ?Take 1 tablet (0.5 mg total) by mouth 2 (two) times daily as needed. ?  ?ascorbic acid 500 MG tablet ?Commonly known as: VITAMIN C ?Take 500 mg by mouth daily. ?  ?aspirin EC 81 MG tablet ?Take 1 tablet (81 mg total) by mouth at bedtime. Swallow whole. ?  ?atorvastatin 20 MG tablet ?Commonly known as: LIPITOR ?Take 1 tablet (20 mg total) by mouth daily. ?  ?Biotin 1 MG Caps ?Take 1 mg by mouth daily. ?  ?carvedilol 3.125 MG tablet ?Commonly known as: COREG ?Take 1 tablet (3.125 mg total) by mouth 2 (two) times daily with a meal. Hold if SBP<110 or DBP<60 ?  ?cetirizine 10 MG tablet ?Commonly known as: ZYRTEC ?Take 10 mg by mouth every morning. ?  ?Cranberry 450 MG Caps ?Take 1 capsule by mouth daily. ?  ?dapagliflozin propanediol 10 MG Tabs tablet ?Commonly known as: [place] ?Take 1 tablet (10 mg total) by mouth daily  before breakfast. ?  ?escitalopram 5 MG tablet ?Commonly known as: LEXAPRO ?Take 1 tablet (5 mg total) by mouth every morning. ?  ?folic acid 700 MCG tablet ?Commonly known as: FOLVITE ?Take 1 tablet (400 mcg total) by mouth daily. SEPARATE ALL SUPPLEMENTS TO LUNCH OR DINNER AND PRILOSEC NOT TO MESS W/THYROID MED ?  ?furosemide 40 MG tablet ?Commonly known as: LASIX ?Take 1 tablet (40 mg total) by mouth daily. Hold if SBP<110 or DBP<60 ?  ?levothyroxine 88 MCG tablet ?Commonly known as: SYNTHROID ?Take 1 tablet (88 mcg total) by mouth daily before breakfast.  Skip sundays ?  ?losartan 25 MG tablet ?Commonly known as: COZAAR ?Take 0.5 tablets (12.5 mg total) by mouth daily. Hold if SBP<110 or DBP<60 ?  ?magnesium oxide 400 (240 Mg) MG tablet ?Commonly known as: MAG-OX ?Take 1 tablet (400 mg total) by mouth daily. ?  ?melatonin 5 MG Tabs ?Take 1 tablet (5 mg total) by mouth at bedtime. ?  ?montelukast 10 MG tablet ?Commonly known as: SINGULAIR ?Take 1 tablet (10 mg total) by mouth at bedtime. ?  ?multivitamin with minerals Tabs tablet ?Take 1 tablet by mouth daily. ?  ?Myrbetriq 50 MG Tb24 tablet ?Generic drug: mirabegron ER ?TAKE ONE TABLET EVERY DAY ?  ?omeprazole 40 MG capsule ?Commonly known as: PRILOSEC ?Take 1 capsule (40 mg total) by mouth daily. After lunch ?  ?polyethylene glycol powder 17 GM/SCOOP powder ?Commonly known as: GLYCOLAX/MIRALAX ?Take 17 g by mouth daily as needed for moderate constipation. ?  ?potassium chloride SA 20 MEQ tablet ?Commonly known as: KLOR-CON M ?Take 1 tablet (20 mEq total) by mouth 2 (two) times daily. ?  ?pregabalin 75 MG capsule ?Commonly known as: LYRICA ?Take 75 mg by mouth 3 (three) times daily. ?  ?traZODone 50 MG tablet ?Commonly known as: DESYREL ?TAKE 2 TABLETS EVERY DAY 1 HOUR BEFORE BED ?  ?vitamin B-12 1000 MCG tablet ?Commonly known as: CYANOCOBALAMIN ?Take 1 tablet (1,000 mcg total) by mouth daily. ?  ?Vitamin D3 125 MCG (5000 UT) Tabs ?Take 1 tablet (5,000 Units  total) by mouth daily. ?  ? ?  ? ? ?Allergies: No Known Allergies ? ?Family History: ?Family History  ?Problem Relation Age of Onset  ? Heart disease Mother   ? Hypertension Mother   ? Diabetes Mother   ? H

## 2021-10-17 ENCOUNTER — Encounter: Payer: Self-pay | Admitting: Nurse Practitioner

## 2021-10-17 ENCOUNTER — Ambulatory Visit (INDEPENDENT_AMBULATORY_CARE_PROVIDER_SITE_OTHER): Payer: Medicare HMO | Admitting: Nurse Practitioner

## 2021-10-17 VITALS — BP 124/70 | HR 66 | Ht 64.0 in | Wt 163.0 lb

## 2021-10-17 DIAGNOSIS — I428 Other cardiomyopathies: Secondary | ICD-10-CM | POA: Diagnosis not present

## 2021-10-17 DIAGNOSIS — I3139 Other pericardial effusion (noninflammatory): Secondary | ICD-10-CM | POA: Diagnosis not present

## 2021-10-17 DIAGNOSIS — I10 Essential (primary) hypertension: Secondary | ICD-10-CM | POA: Diagnosis not present

## 2021-10-17 DIAGNOSIS — E782 Mixed hyperlipidemia: Secondary | ICD-10-CM | POA: Diagnosis not present

## 2021-10-17 DIAGNOSIS — N182 Chronic kidney disease, stage 2 (mild): Secondary | ICD-10-CM | POA: Diagnosis not present

## 2021-10-17 DIAGNOSIS — I5022 Chronic systolic (congestive) heart failure: Secondary | ICD-10-CM

## 2021-10-17 MED ORDER — LOSARTAN POTASSIUM 25 MG PO TABS: 25.0000 mg | ORAL_TABLET | Freq: Every day | ORAL | 3 refills | Status: DC

## 2021-10-17 MED ORDER — LOSARTAN POTASSIUM 25 MG PO TABS: 12.5000 mg | ORAL_TABLET | Freq: Every day | ORAL | 3 refills | Status: DC

## 2021-10-17 NOTE — Addendum Note (Signed)
Addended by: Valora Corporal on: 10/17/2021 02:08 PM ? ? Modules accepted: Orders ? ?

## 2021-10-17 NOTE — Progress Notes (Addendum)
? ? ?Office Visit  ?  ?Patient Name: Kimberly Cook ?Date of Encounter: 10/17/2021 ? ?Primary Care Provider:  Virgie Dad, FNP ?Primary Cardiologist:  Nelva Bush, MD ? ?Chief Complaint  ?  ?76 year old female with a history of nonobstructive CAD, hypertension, hyperlipidemia, hypothyroidism, stroke, GERD, CKD 2, depression, and anxiety, presents for follow-up related to heart failure and nonischemic cardiomyopathy (EF 30 to 35%). ? ?Past Medical History  ?  ?Past Medical History:  ?Diagnosis Date  ? Allergy   ? Anxiety   ? Arthritis   ? Back pain   ? Chronic HFrEF (heart failure with reduced ejection fraction) ([place])   ? a. 08/2021 echo: EF 30-35%, glob HK, GrI DD; b. 10/2021 Echo: EF 30-35%.  ? CKD (chronic kidney disease), stage II   ? Coronary artery disease   ? a. Mild to moderate CAD in LAD/diagonal by CTA (CT-FFR of apical LAD 0.79); b. 08/2021 Cath: LM nl, LAD min irregs, D1/2/3 nl, LCX nl, OM2/3 nl, RCA 20p, RPDA mild dzs, RPAV nl.  ? DDD (degenerative disc disease), lumbar   ? Depression   ? Diverticulosis   ? Essential hypertension   ? GERD (gastroesophageal reflux disease)   ? Headache   ? History of shingles   ? Hyperlipidemia   ? Hypothyroidism   ? Lung nodule   ? Mini stroke 2011  ? Moderate Pericardial effusion   ? a. 08/2021 Echo: moderate circumferential pericardial effusion w/o tamponade; b. 10/2021 Echo: EF 30-35%, small to mod circumferential pericardial effusion w/o tamponade.  ? NICM (nonischemic cardiomyopathy) ([place])   ? a. 08/2021 Echo: EF 30-35%, glob HK, GrI DD, nl RV fxn, mild-mod dil LA, mod circumferential pericardial eff w/o tamponade, Mod MR; b. 10/2021 Echo: EF 30-35%, glob HK.  ? Occipital neuralgia   ? Palpitations   ? Pleural effusion on left   ? a. 08/2021 s/p thoracentesis.  ? Pneumonia 2018  ? Prediabetes   ? Stroke [facility])   ? Stroke [facility])   ? MRI 04/2008 + left sup. frontal gyrus possibly puntate infarct   ? Syncope 2019  ? Urinary tract infection   ? Vitamin D deficiency    ? ?Past Surgical History:  ?Procedure Laterality Date  ? ABDOMINAL HYSTERECTOMY    ? BLADDER SURGERY    ? 2003  ? BREAST EXCISIONAL BIOPSY Right Over 20 years   ? Benign  ? CHOLECYSTECTOMY    ? gastroplication     ? JOINT REPLACEMENT Left   ? KNEE  ? KNEE ARTHROSCOPY Left 2011  ? PULSE GENERATOR IMPLANT Left 01/31/2020  ? Procedure: PLACEMENT RIGHT FLANK PULSE GENERATOR VS REMOVAL SPINAL CORD STIMULATOR;  Surgeon: Deetta Perla, MD;  Location: [facility] OR[facility];  Service: Neurosurgery;  Laterality: Left;  ? PULSE GENERATOR IMPLANT Left 04/24/2020  ? Procedure: REPLACEMENT LEFT FLANK PULSE GENERATOR IMPLANT;  Surgeon: Deetta Perla, MD;  Location: [facility] OR[facility];  Service: Neurosurgery;  Laterality: Left;  MAC w/ local  ? right arm fracture    ? RIGHT/LEFT HEART CATH AND CORONARY ANGIOGRAPHY N/A 08/28/2021  ? Procedure: RIGHT/LEFT HEART CATH AND CORONARY ANGIOGRAPHY;  Surgeon: Wellington Hampshire, MD;  Location: [place] [facility];  Service: Cardiovascular;  Laterality: N/A;  ? SPINAL CORD STIMULATOR REMOVAL N/A 06/26/2020  ? Procedure: SPINAL CORD STIMULATOR REMOVAL;  Surgeon: Deetta Perla, MD;  Location: [facility] OR[facility];  Service: Neurosurgery;  Laterality: N/A;  ? THORACIC LAMINECTOMY FOR SPINAL CORD STIMULATOR N/A 01/24/2020  ? Procedure: THORACIC SPINAL CORD STIMULATOR PADDLE TRIAL VIA  LAMINECTOMY;  Surgeon: Deetta Perla, MD;  Location: [facility] OR[facility];  Service: Neurosurgery;  Laterality: N/A;  ? TONSILLECTOMY AND ADENOIDECTOMY    ? TOTAL KNEE ARTHROPLASTY Left 04/17/2015  ? Procedure: LEFT TOTAL KNEE ARTHROPLASTY;  Surgeon: Paralee Cancel, MD;  Location: [facility];  Service: Orthopedics;  Laterality: Left;  ? ? ?Allergies ? ?No Known Allergies ? ?History of Present Illness  ?  ?76 year old female with above past medical history including nonobstructive CAD, hypertension, hyperlipidemia, hypothyroidism, CKD 2, stroke, GERD, depression, and anxiety.  Prior echocardiogram in August 2018 showed an EF of 50 to 55% with grade 1 diastolic dysfunction.   Coronary CT angiography in September 2018, showed mild nonobstructive CAD involving the proximal LAD and second diagonal.  She subsequently had a low risk Myoview in February 2022 in the setting of intermittent chest discomfort.  Kimberly Cook was admitted to [facility] in late January 2023 with worsening dyspnea and ankle swelling.  She was also noted to moderate bilateral pleural effusions.  Echo showed an EF of 30 to 35% global hypokinesis, moderate mitral regurgitation, and a moderate circumferential pericardial effusion without tamponade.  Diagnostic catheterization showed mild proximal RCA disease and otherwise relatively normal coronary arteries.  She was medically managed.  She did require thoracentesis of her left pleural effusion with 500 mL removed.  On telemetry, she was noted to have frequent PACs with brief runs of atrial tachycardia and intermittent Mobitz 1, typically seen coming out of an atrial run.  She was discharged to skilled nursing facility on January 26 on beta-blocker, ARB, and once daily Lasix.  Excuse me ? ?At cardiology follow-up on February 7, [name] noted feeling well.  [name] was added.  Subsequent limited echo earlier this month shows persistent LV dysfunction with an EF of 30-35%, and a small to moderate circumferential pericardial effusion without tamponade.  Since her last visit, she has done reasonably well.  She has been doing a fair amount of walking, using a walker, and has tolerated this reasonably well.  She does experience dyspnea on exertion with faster paces.  She does not experience chest pain and denies palpitations, PND, orthopnea, dizziness, syncope, edema, or early satiety.  Her blood pressure this morning was in the 160s.  She was 140/70 here and on repeat, is 124/70.  She says typically she is trending in the 120s at her skilled nursing facility. ? ?Home Medications  ?  ?Current Outpatient Medications  ?Medication Sig Dispense Refill  ? acetaminophen  (TYLENOL) 500 MG tablet Take 1,000 mg by mouth every 8 (eight) hours as needed for moderate pain.    ? alendronate (FOSAMAX) 70 MG tablet Take 70 mg by mouth once a week.     ? ALPRAZolam (XANAX) 0.5 MG tablet Take 1 tablet (0.5 mg total) by mouth 2 (two) times daily as needed. 30 tablet 0  ? ascorbic acid (VITAMIN C) 500 MG tablet Take 500 mg by mouth daily.    ? aspirin EC 81 MG tablet Take 1 tablet (81 mg total) by mouth at bedtime. Swallow whole. 90 tablet 3  ? atorvastatin (LIPITOR) 20 MG tablet Take 1 tablet (20 mg total) by mouth daily. 90 tablet 3  ? Biotin 1 MG CAPS Take 1 mg by mouth daily.     ? carvedilol (COREG) 3.125 MG tablet Take 1 tablet (3.125 mg total) by mouth 2 (two) times daily with a meal. Hold if SBP<110 or DBP<60    ? cetirizine (ZYRTEC) 10 MG tablet Take 10  mg by mouth every morning.    ? Cholecalciferol (VITAMIN D3) 125 MCG (5000 UT) TABS Take 1 tablet (5,000 Units total) by mouth daily. 90 tablet 3  ? Cranberry 450 MG CAPS Take 1 capsule by mouth daily.    ? dapagliflozin propanediol (FARXIGA) 10 MG TABS tablet Take 1 tablet (10 mg total) by mouth daily before breakfast. 30 tablet 6  ? escitalopram (LEXAPRO) 5 MG tablet Take 1 tablet (5 mg total) by mouth every morning. 90 tablet 3  ? folic acid (FOLVITE) 842 MCG tablet Take 1 tablet (400 mcg total) by mouth daily. SEPARATE ALL SUPPLEMENTS TO LUNCH OR DINNER AND PRILOSEC NOT TO MESS W/THYROID MED 90 tablet 3  ? furosemide (LASIX) 40 MG tablet Take 1 tablet (40 mg total) by mouth daily. Hold if SBP<110 or DBP<60 30 tablet   ? levothyroxine (SYNTHROID) 88 MCG tablet Take 1 tablet (88 mcg total) by mouth daily before breakfast. Skip sundays 90 tablet 3  ? magnesium oxide (MAG-OX) 400 (240 Mg) MG tablet Take 1 tablet (400 mg total) by mouth daily.    ? melatonin 5 MG TABS Take 1 tablet (5 mg total) by mouth at bedtime.  0  ? montelukast (SINGULAIR) 10 MG tablet Take 1 tablet (10 mg total) by mouth at bedtime. 90 tablet 3  ? Multiple Vitamin  (MULTIVITAMIN WITH MINERALS) TABS tablet Take 1 tablet by mouth daily.    ? MYRBETRIQ 50 MG TB24 tablet TAKE ONE TABLET EVERY DAY 30 tablet 11  ? omeprazole (PRILOSEC) 40 MG capsule Take 1 capsule (40 mg total

## 2021-10-17 NOTE — Patient Instructions (Addendum)
Medication Instructions:  ?Your physician has recommended you make the following change in your medication:  ? ?CONTINUE Losartan to 12.5 mg once daily and hold for Systolic Blood pressure less than 110. ? ?*If you need a refill on your cardiac medications before your next appointment, please call your pharmacy* ? ? ?Lab Work: ?None ? ?If you have labs (blood work) drawn today and your tests are completely normal, you will receive your results only by: ?MyChart Message (if you have MyChart) OR ?A paper copy in the mail ?If you have any lab test that is abnormal or we need to change your treatment, we will call you to review the results. ? ? ?Testing/Procedures: ?None ? ? ?Follow-Up: ?At [facility], you and your health needs are our priority.  As part of our continuing mission to provide you with exceptional heart care, we have created designated Provider Care Teams.  These Care Teams include your primary Cardiologist (physician) and Advanced Practice Providers (APPs -  Physician Assistants and Nurse Practitioners) who all work together to provide you with the care you need, when you need it. ? ? ?Your next appointment:   ?11/12/21 at 10:30 am with [facility] ?01/04/22 at 09:00 am with Dr. Okey Dupre ? ?The format for your next appointment:   ?In Person ? ?Provider:   ?Yvonne Kendall, MD ?

## 2021-10-18 MED ORDER — LOSARTAN POTASSIUM 25 MG PO TABS: 12.5000 mg | ORAL_TABLET | Freq: Every day | ORAL | 3 refills | Status: DC

## 2021-10-18 NOTE — Addendum Note (Signed)
Addended by: Bryna Colander on: 10/18/2021 10:04 AM ? ? Modules accepted: Orders ? ?

## 2021-10-23 DIAGNOSIS — E039 Hypothyroidism, unspecified: Secondary | ICD-10-CM | POA: Diagnosis not present

## 2021-10-23 DIAGNOSIS — M419 Scoliosis, unspecified: Secondary | ICD-10-CM | POA: Diagnosis not present

## 2021-10-23 DIAGNOSIS — E539 Vitamin B deficiency, unspecified: Secondary | ICD-10-CM | POA: Diagnosis not present

## 2021-10-23 DIAGNOSIS — K219 Gastro-esophageal reflux disease without esophagitis: Secondary | ICD-10-CM | POA: Diagnosis not present

## 2021-10-23 DIAGNOSIS — K5900 Constipation, unspecified: Secondary | ICD-10-CM | POA: Diagnosis not present

## 2021-10-23 DIAGNOSIS — G4700 Insomnia, unspecified: Secondary | ICD-10-CM | POA: Diagnosis not present

## 2021-10-23 DIAGNOSIS — J309 Allergic rhinitis, unspecified: Secondary | ICD-10-CM | POA: Diagnosis not present

## 2021-10-23 DIAGNOSIS — F32A Depression, unspecified: Secondary | ICD-10-CM | POA: Diagnosis not present

## 2021-10-25 ENCOUNTER — Encounter: Payer: Self-pay | Admitting: Urology

## 2021-10-25 DIAGNOSIS — Z981 Arthrodesis status: Secondary | ICD-10-CM | POA: Diagnosis not present

## 2021-10-25 DIAGNOSIS — M4326 Fusion of spine, lumbar region: Secondary | ICD-10-CM | POA: Diagnosis not present

## 2021-11-06 DIAGNOSIS — G629 Polyneuropathy, unspecified: Secondary | ICD-10-CM | POA: Diagnosis not present

## 2021-11-06 DIAGNOSIS — F419 Anxiety disorder, unspecified: Secondary | ICD-10-CM | POA: Diagnosis not present

## 2021-11-06 DIAGNOSIS — E782 Mixed hyperlipidemia: Secondary | ICD-10-CM | POA: Diagnosis not present

## 2021-11-06 DIAGNOSIS — I10 Essential (primary) hypertension: Secondary | ICD-10-CM | POA: Diagnosis not present

## 2021-11-06 DIAGNOSIS — E039 Hypothyroidism, unspecified: Secondary | ICD-10-CM | POA: Diagnosis not present

## 2021-11-06 DIAGNOSIS — K219 Gastro-esophageal reflux disease without esophagitis: Secondary | ICD-10-CM | POA: Diagnosis not present

## 2021-11-06 DIAGNOSIS — F32A Depression, unspecified: Secondary | ICD-10-CM | POA: Diagnosis not present

## 2021-11-06 DIAGNOSIS — K5900 Constipation, unspecified: Secondary | ICD-10-CM | POA: Diagnosis not present

## 2021-11-07 DIAGNOSIS — I428 Other cardiomyopathies: Secondary | ICD-10-CM | POA: Diagnosis not present

## 2021-11-09 DIAGNOSIS — N390 Urinary tract infection, site not specified: Secondary | ICD-10-CM | POA: Diagnosis not present

## 2021-11-10 NOTE — Progress Notes (Signed)
? Patient ID: Kimberly Cook, female    DOB: 03-Feb-1946, 76 y.o.   MRN: [id number] ? ?HPI ? ?Ms Nuon is a 76 y/o female with a history of CAD, hyperlipidemia, HTN, CKD, stroke, syncope, thyroid disease, anxiety, DDD, depression, diverticulosis, GERD, occipital neuralgia, vitamin D deficiency and chronic heart failure.  ? ?Echo report from 10/05/21 reviewed and showed an EF of 30-35% with small/moderate pleural effusion. Echo report from 08/26/21 reviewed and showed an EF of 30-35% along with moderate LAE and moderate MR.  ? ?RHC/LHC done 08/28/21 and showed: ?Prox RCA lesion is 20% stenosed. ?  ?1.  Mild nonobstructive coronary artery disease. ?2.  Left ventricular angiography was not performed.  EF was 30 to 35% by echo. ?3.  Right heart catheterization showed mildly elevated right and left filling pressures, mild pulmonary hypertension and mildly reduced cardiac output. ? ?Admitted 08/25/21 due to shortness of breath and weight gain. Initially given IV lasix with transition to oral diuretics. Cardiology consult obtained. Left thoracentesis done with removal of 528m. BP parameters given for holding losartan, carvedilol and furosemide. PT/OT evaluations done. Discharged after 5 days to SNF.  ? ?She presents today for a follow-up visit with a chief complaint of moderate shortness of breath with minimal exertion. Describes this as chronic in nature although feels like it has worsened over the last week or so. She has associated fatigue, cough, pedal edema, abdominal distention (new), chronic back pain and weight gain along with this.  ? ?She says that she's gained 7 pounds in the last week. Reports a gradual weight gain of ~ 1 pound a day. Says that she's been taking lasix for quite awhile and was taking it prior to her going to SNF. She says that she's been told that she currently has a UTI but doesn't know how that will be treated  yet. Denies any urinary symptoms.  ? ?Abdominal distention is new for her.  ? ?Past  Medical History:  ?Diagnosis Date  ? Allergy   ? Anxiety   ? Arthritis   ? Back pain   ? Chronic HFrEF (heart failure with reduced ejection fraction) ([place]   ? a. 08/2021 echo: EF 30-35%, glob HK, GrI DD; b. 10/2021 Echo: EF 30-35%.  ? CKD (chronic kidney disease), stage II   ? Coronary artery disease   ? a. Mild to moderate CAD in LAD/diagonal by CTA (CT-FFR of apical LAD 0.79); b. 08/2021 Cath: LM nl, LAD min irregs, D1/2/3 nl, LCX nl, OM2/3 nl, RCA 20p, RPDA mild dzs, RPAV nl.  ? DDD (degenerative disc disease), lumbar   ? Depression   ? Diverticulosis   ? Essential hypertension   ? GERD (gastroesophageal reflux disease)   ? Headache   ? History of shingles   ? Hyperlipidemia   ? Hypothyroidism   ? Lung nodule   ? Mini stroke 2011  ? Moderate Pericardial effusion   ? a. 08/2021 Echo: moderate circumferential pericardial effusion w/o tamponade; b. 10/2021 Echo: EF 30-35%, small to mod circumferential pericardial effusion w/o tamponade.  ? NICM (nonischemic cardiomyopathy) ([place]   ? a. 08/2021 Echo: EF 30-35%, glob HK, GrI DD, nl RV fxn, mild-mod dil LA, mod circumferential pericardial eff w/o tamponade, Mod MR; b. 10/2021 Echo: EF 30-35%, glob HK.  ? Occipital neuralgia   ? Palpitations   ? Pleural effusion on left   ? a. 08/2021 s/p thoracentesis.  ? Pneumonia 2018  ? Prediabetes   ? Stroke [facility]   ? Stroke (  [place])   ? MRI 04/2008 + left sup. frontal gyrus possibly puntate infarct   ? Syncope 2019  ? Urinary tract infection   ? Vitamin D deficiency   ? ?Past Surgical History:  ?Procedure Laterality Date  ? ABDOMINAL HYSTERECTOMY    ? BLADDER SURGERY    ? 2003  ? BREAST EXCISIONAL BIOPSY Right Over 20 years   ? Benign  ? CHOLECYSTECTOMY    ? gastroplication     ? JOINT REPLACEMENT Left   ? KNEE  ? KNEE ARTHROSCOPY Left 2011  ? PULSE GENERATOR IMPLANT Left 01/31/2020  ? Procedure: PLACEMENT RIGHT FLANK PULSE GENERATOR VS REMOVAL SPINAL CORD STIMULATOR;  Surgeon: Deetta Perla, MD;  Location: [facility] ORS;  Service: Neurosurgery;   Laterality: Left;  ? PULSE GENERATOR IMPLANT Left 04/24/2020  ? Procedure: REPLACEMENT LEFT FLANK PULSE GENERATOR IMPLANT;  Surgeon: Deetta Perla, MD;  Location: [facility] OR[facility];  Service: Neurosurgery;  Laterality: Left;  MAC w/ local  ? right arm fracture    ? RIGHT/LEFT HEART CATH AND CORONARY ANGIOGRAPHY N/A 08/28/2021  ? Procedure: RIGHT/LEFT HEART CATH AND CORONARY ANGIOGRAPHY;  Surgeon: Wellington Hampshire, MD;  Location: [place] [facility];  Service: Cardiovascular;  Laterality: N/A;  ? SPINAL CORD STIMULATOR REMOVAL N/A 06/26/2020  ? Procedure: SPINAL CORD STIMULATOR REMOVAL;  Surgeon: Deetta Perla, MD;  Location: [facility] OR[facility];  Service: Neurosurgery;  Laterality: N/A;  ? THORACIC LAMINECTOMY FOR SPINAL CORD STIMULATOR N/A 01/24/2020  ? Procedure: THORACIC SPINAL CORD STIMULATOR PADDLE TRIAL VIA LAMINECTOMY;  Surgeon: Deetta Perla, MD;  Location: [facility];  Service: Neurosurgery;  Laterality: N/A;  ? TONSILLECTOMY AND ADENOIDECTOMY    ? TOTAL KNEE ARTHROPLASTY Left 04/17/2015  ? Procedure: LEFT TOTAL KNEE ARTHROPLASTY;  Surgeon: Paralee Cancel, MD;  Location: [facility];  Service: Orthopedics;  Laterality: Left;  ? ?Family History  ?Problem Relation Age of Onset  ? Heart disease Mother   ? Hypertension Mother   ? Diabetes Mother   ? Heart attack Mother 76  ? Heart disease Father   ? Heart attack Father 80  ? Breast cancer Maternal Aunt   ? Heart attack Brother   ? ?Social History  ? ?Tobacco Use  ? Smoking status: Never  ?  Passive exposure: Yes  ? Smokeless tobacco: Never  ? Tobacco comments:  ?  husbands and children smoked in home.   ?Substance Use Topics  ? Alcohol use: No  ? ?No Known Allergies ? ?Prior to Admission medications   ?Medication Sig Start Date End Date Taking? Authorizing Provider  ?acetaminophen (TYLENOL) 500 MG tablet Take 1,000 mg by mouth every 8 (eight) hours as needed for moderate pain.    [provider]  ?alendronate (FOSAMAX) 70 MG tablet Take 70 mg by mouth once a week.  10/23/19   [provider]  ?ALPRAZolam Duanne Moron) 0.5 MG tablet Take 1 tablet (0.5 mg total) by mouth 2 (two) times daily as needed. 08/30/21   Ezekiel Slocumb, DO  ?ascorbic acid (VITAMIN C) 500 MG tablet Take 500 mg by mouth daily.    [provider]  ?aspirin EC 81 MG tablet Take 1 tablet (81 mg total) by mouth at bedtime. Swallow whole. 10/25/20   McLean-Scocuzza, Nino Glow, MD  ?atorvastatin (LIPITOR) 20 MG tablet Take 1 tablet (20 mg total) by mouth daily. 06/20/20   McLean-Scocuzza, Nino Glow, MD  ?Biotin 1 MG CAPS Take 1 mg by mouth daily.     [provider]  ?carvedilol (COREG) 3.125  MG tablet Take 1 tablet (3.125 mg total) by mouth 2 (two) times daily with a meal. Hold if SBP<110 or DBP<60 08/30/21   Nicole Kindred A, DO  ?cetirizine (ZYRTEC) 10 MG tablet Take 10 mg by mouth every morning.    [provider]  ?Cholecalciferol (VITAMIN D3) 125 MCG (5000 UT) TABS Take 1 tablet (5,000 Units total) by mouth daily. 10/09/18   McLean-Scocuzza, Nino Glow, MD  ?Cranberry 450 MG CAPS Take 1 capsule by mouth daily.    [provider]  ?dapagliflozin propanediol (FARXIGA) 10 MG TABS tablet Take 1 tablet (10 mg total) by mouth daily before breakfast. 09/11/21   Theora Gianotti, NP  ?escitalopram (LEXAPRO) 5 MG tablet Take 1 tablet (5 mg total) by mouth every morning. 06/23/20   McLean-Scocuzza, Nino Glow, MD  ?folic acid (FOLVITE) 837 MCG tablet Take 1 tablet (400 mcg total) by mouth daily. SEPARATE ALL SUPPLEMENTS TO LUNCH OR DINNER AND PRILOSEC NOT TO MESS W/THYROID MED 12/10/18   McLean-Scocuzza, Nino Glow, MD  ?furosemide (LASIX) 40 MG tablet Take 1 tablet (40 mg total) by mouth daily. Hold if SBP<110 or DBP<60 08/31/21   Ezekiel Slocumb, DO  ?levothyroxine (SYNTHROID) 88 MCG tablet Take 1 tablet (88 mcg total) by mouth daily before breakfast. Skip sundays 12/02/19   McLean-Scocuzza, Nino Glow, MD  ?losartan (COZAAR) 25 MG tablet Take 0.5 tablets (12.5 mg total) by mouth daily. Hold for Systolic  Blood pressure less than 110. 10/18/21   Theora Gianotti, NP  ?magnesium oxide (MAG-OX) 400 (240 Mg) MG tablet Take 1 tablet (400 mg total) by mouth daily. 08/31/21   Ezekiel Slocumb, DO  ?mela

## 2021-11-12 ENCOUNTER — Ambulatory Visit: Payer: Medicare HMO | Attending: Family | Admitting: Family

## 2021-11-12 ENCOUNTER — Encounter: Payer: Self-pay | Admitting: Family

## 2021-11-12 VITALS — BP 106/90 | HR 70 | Resp 16 | Ht 64.0 in | Wt 169.1 lb

## 2021-11-12 DIAGNOSIS — I2510 Atherosclerotic heart disease of native coronary artery without angina pectoris: Secondary | ICD-10-CM | POA: Insufficient documentation

## 2021-11-12 DIAGNOSIS — K219 Gastro-esophageal reflux disease without esophagitis: Secondary | ICD-10-CM | POA: Diagnosis not present

## 2021-11-12 DIAGNOSIS — I5022 Chronic systolic (congestive) heart failure: Secondary | ICD-10-CM | POA: Diagnosis not present

## 2021-11-12 DIAGNOSIS — Z7984 Long term (current) use of oral hypoglycemic drugs: Secondary | ICD-10-CM | POA: Insufficient documentation

## 2021-11-12 DIAGNOSIS — E039 Hypothyroidism, unspecified: Secondary | ICD-10-CM | POA: Diagnosis not present

## 2021-11-12 DIAGNOSIS — E782 Mixed hyperlipidemia: Secondary | ICD-10-CM | POA: Diagnosis not present

## 2021-11-12 DIAGNOSIS — Z8673 Personal history of transient ischemic attack (TIA), and cerebral infarction without residual deficits: Secondary | ICD-10-CM | POA: Insufficient documentation

## 2021-11-12 DIAGNOSIS — F32A Depression, unspecified: Secondary | ICD-10-CM | POA: Diagnosis not present

## 2021-11-12 DIAGNOSIS — Z79899 Other long term (current) drug therapy: Secondary | ICD-10-CM | POA: Insufficient documentation

## 2021-11-12 DIAGNOSIS — I130 Hypertensive heart and chronic kidney disease with heart failure and stage 1 through stage 4 chronic kidney disease, or unspecified chronic kidney disease: Secondary | ICD-10-CM | POA: Insufficient documentation

## 2021-11-12 DIAGNOSIS — R8279 Other abnormal findings on microbiological examination of urine: Secondary | ICD-10-CM | POA: Diagnosis not present

## 2021-11-12 DIAGNOSIS — I10 Essential (primary) hypertension: Secondary | ICD-10-CM | POA: Diagnosis not present

## 2021-11-12 DIAGNOSIS — Z7982 Long term (current) use of aspirin: Secondary | ICD-10-CM | POA: Diagnosis not present

## 2021-11-12 DIAGNOSIS — M48062 Spinal stenosis, lumbar region with neurogenic claudication: Secondary | ICD-10-CM | POA: Diagnosis not present

## 2021-11-12 DIAGNOSIS — E785 Hyperlipidemia, unspecified: Secondary | ICD-10-CM | POA: Insufficient documentation

## 2021-11-12 DIAGNOSIS — M810 Age-related osteoporosis without current pathological fracture: Secondary | ICD-10-CM | POA: Diagnosis not present

## 2021-11-12 MED ORDER — TORSEMIDE 20 MG PO TABS: 40.0000 mg | ORAL_TABLET | Freq: Every day | ORAL | 3 refills | Status: DC

## 2021-11-12 NOTE — Patient Instructions (Addendum)
Continue weighing daily and call for an overnight weight gain of 3 pounds or more or a weekly weight gain of more than 5 pounds. ? ? ?Stop taking furosemide and will begin torsemide 40mg  every morning ?

## 2021-11-15 DIAGNOSIS — I509 Heart failure, unspecified: Secondary | ICD-10-CM | POA: Diagnosis not present

## 2021-11-15 DIAGNOSIS — I10 Essential (primary) hypertension: Secondary | ICD-10-CM | POA: Diagnosis not present

## 2021-11-15 DIAGNOSIS — E539 Vitamin B deficiency, unspecified: Secondary | ICD-10-CM | POA: Diagnosis not present

## 2021-11-15 DIAGNOSIS — F32A Depression, unspecified: Secondary | ICD-10-CM | POA: Diagnosis not present

## 2021-11-15 DIAGNOSIS — F419 Anxiety disorder, unspecified: Secondary | ICD-10-CM | POA: Diagnosis not present

## 2021-11-15 DIAGNOSIS — K219 Gastro-esophageal reflux disease without esophagitis: Secondary | ICD-10-CM | POA: Diagnosis not present

## 2021-11-15 DIAGNOSIS — E039 Hypothyroidism, unspecified: Secondary | ICD-10-CM | POA: Diagnosis not present

## 2021-11-15 DIAGNOSIS — K5900 Constipation, unspecified: Secondary | ICD-10-CM | POA: Diagnosis not present

## 2021-11-16 ENCOUNTER — Ambulatory Visit: Payer: Medicare HMO | Admitting: Urology

## 2021-11-20 NOTE — Progress Notes (Signed)
? ?11/21/2021 ?12:09 PM  ? ?Kimberly Cook ?20-Dec-1945 ?[id number] ? ?Referring provider: Virgie Dad, [address] ?[address] ?[address] ?[place],  [place] [address] ? ?Chief Complaint  ?Patient presents with  ? Over Active Bladder  ? ?Urological history: ?1. rUTI's ?-contributing factors of age, vaginal atrophy, constipation, poor perineal hygiene and incontinence ?-documented positive urine cultures over the last year ? 03/31/2021 Klebsiella pneumoniae ?-managed with cranberry tablets  ? ?2. OAB ?-contributing factors of age, vaginal atrophy, HTN, neuropathy, lumbar radiculopathy, CVA, diuretics and benzo's  ?-PVR 52 mL  ?-managed with Myrbetriq 50 mg daily  ?  ? ?HPI: ?Kimberly Cook is a 76 y.o. female who presents today for a 1 month follow-up after discontinuing Myrbetriq.   ? ?Her facility did not discontinue the Myrbetriq as advised. ? ?She says having 8 or more daytime urinations, nocturia x2 with a strong urge to urinate she has urge incontinence she is wearing 1 panty liner with 3 absorbent pads daily and she does engage in toilet mapping.   ? ?She is having moments of urinary hesitancy while on the Myrbetriq.  Her bladder scan noted a possible residual of 359 mL when she first presented to the office. ? ?PVR 52 mL   ? ?She also states that she recently had a urinary tract infection diagnosed by her facility.  I do not have record of that.  She states her symptoms were milky colored urine. ? ? ?PMH: ?Past Medical History:  ?Diagnosis Date  ? Allergy   ? Anxiety   ? Arthritis   ? Back pain   ? Chronic HFrEF (heart failure with reduced ejection fraction) ([place])   ? a. 08/2021 echo: EF 30-35%, glob HK, GrI DD; b. 10/2021 Echo: EF 30-35%.  ? CKD (chronic kidney disease), stage II   ? Coronary artery disease   ? a. Mild to moderate CAD in LAD/diagonal by CTA (CT-FFR of apical LAD 0.79); b. 08/2021 Cath: LM nl, LAD min irregs, D1/2/3 nl, LCX nl, OM2/3 nl, RCA 20p, RPDA mild dzs, RPAV nl.  ? DDD (degenerative  disc disease), lumbar   ? Depression   ? Diverticulosis   ? Essential hypertension   ? GERD (gastroesophageal reflux disease)   ? Headache   ? History of shingles   ? Hyperlipidemia   ? Hypothyroidism   ? Lung nodule   ? Mini stroke 2011  ? Moderate Pericardial effusion   ? a. 08/2021 Echo: moderate circumferential pericardial effusion w/o tamponade; b. 10/2021 Echo: EF 30-35%, small to mod circumferential pericardial effusion w/o tamponade.  ? NICM (nonischemic cardiomyopathy) ([place])   ? a. 08/2021 Echo: EF 30-35%, glob HK, GrI DD, nl RV fxn, mild-mod dil LA, mod circumferential pericardial eff w/o tamponade, Mod MR; b. 10/2021 Echo: EF 30-35%, glob HK.  ? Occipital neuralgia   ? Palpitations   ? Pleural effusion on left   ? a. 08/2021 s/p thoracentesis.  ? Pneumonia 2018  ? Prediabetes   ? Stroke [facility])   ? Stroke [facility])   ? MRI 04/2008 + left sup. frontal gyrus possibly puntate infarct   ? Syncope 2019  ? Urinary tract infection   ? Vitamin D deficiency   ? ? ?Surgical History: ?Past Surgical History:  ?Procedure Laterality Date  ? ABDOMINAL HYSTERECTOMY    ? BLADDER SURGERY    ? 2003  ? BREAST EXCISIONAL BIOPSY Right Over 20 years   ? Benign  ? CHOLECYSTECTOMY    ? gastroplication     ?  JOINT REPLACEMENT Left   ? KNEE  ? KNEE ARTHROSCOPY Left 2011  ? PULSE GENERATOR IMPLANT Left 01/31/2020  ? Procedure: PLACEMENT RIGHT FLANK PULSE GENERATOR VS REMOVAL SPINAL CORD STIMULATOR;  Surgeon: Deetta Perla, MD;  Location: [facility];  Service: Neurosurgery;  Laterality: Left;  ? PULSE GENERATOR IMPLANT Left 04/24/2020  ? Procedure: REPLACEMENT LEFT FLANK PULSE GENERATOR IMPLANT;  Surgeon: Deetta Perla, MD;  Location: [facility];  Service: Neurosurgery;  Laterality: Left;  MAC w/ local  ? right arm fracture    ? RIGHT/LEFT HEART CATH AND CORONARY ANGIOGRAPHY N/A 08/28/2021  ? Procedure: RIGHT/LEFT HEART CATH AND CORONARY ANGIOGRAPHY;  Surgeon: Wellington Hampshire, MD;  Location: [place] CV LAB;  Service: Cardiovascular;  Laterality:  N/A;  ? SPINAL CORD STIMULATOR REMOVAL N/A 06/26/2020  ? Procedure: SPINAL CORD STIMULATOR REMOVAL;  Surgeon: Deetta Perla, MD;  Location: [facility];  Service: Neurosurgery;  Laterality: N/A;  ? THORACIC LAMINECTOMY FOR SPINAL CORD STIMULATOR N/A 01/24/2020  ? Procedure: THORACIC SPINAL CORD STIMULATOR PADDLE TRIAL VIA LAMINECTOMY;  Surgeon: Deetta Perla, MD;  Location: [facility] OR[facility];  Service: Neurosurgery;  Laterality: N/A;  ? TONSILLECTOMY AND ADENOIDECTOMY    ? TOTAL KNEE ARTHROPLASTY Left 04/17/2015  ? Procedure: LEFT TOTAL KNEE ARTHROPLASTY;  Surgeon: Paralee Cancel, MD;  Location: [facility];  Service: Orthopedics;  Laterality: Left;  ? ? ?Home Medications:  ?Allergies as of 11/21/2021   ?No Known Allergies ?  ? ?  ?Medication List  ?  ? ?  ? Accurate as of November 21, 2021 12:09 PM. If you have any questions, ask your nurse or doctor.  ?  ?  ? ?  ? ?acetaminophen 500 MG tablet ?Commonly known as: TYLENOL ?Take 1,000 mg by mouth every 8 (eight) hours as needed for moderate pain. ?  ?alendronate 70 MG tablet ?Commonly known as: FOSAMAX ?Take 70 mg by mouth once a week. ?  ?ascorbic acid 500 MG tablet ?Commonly known as: VITAMIN C ?Take 500 mg by mouth daily. ?  ?aspirin EC 81 MG tablet ?Take 1 tablet (81 mg total) by mouth at bedtime. Swallow whole. ?  ?atorvastatin 20 MG tablet ?Commonly known as: LIPITOR ?Take 1 tablet (20 mg total) by mouth daily. ?  ?Biotin 1 MG Caps ?Take 1 mg by mouth daily. ?  ?carvedilol 3.125 MG tablet ?Commonly known as: COREG ?Take 1 tablet (3.125 mg total) by mouth 2 (two) times daily with a meal. Hold if SBP<110 or DBP<60 ?  ?cetirizine 10 MG tablet ?Commonly known as: ZYRTEC ?Take 10 mg by mouth every morning. ?  ?citalopram 10 MG tablet ?Commonly known as: CELEXA ?Take 10 mg by mouth daily. ?  ?Cranberry 450 MG Caps ?Take 1 capsule by mouth daily. ?  ?dapagliflozin propanediol 10 MG Tabs tablet ?Commonly known as: [place] ?Take 1 tablet (10 mg total) by mouth daily before breakfast. ?  ?folic  acid 300 MCG tablet ?Commonly known as: FOLVITE ?Take 1 tablet (400 mcg total) by mouth daily. SEPARATE ALL SUPPLEMENTS TO LUNCH OR DINNER AND PRILOSEC NOT TO MESS W/THYROID MED ?  ?levothyroxine 88 MCG tablet ?Commonly known as: SYNTHROID ?Take 1 tablet (88 mcg total) by mouth daily before breakfast. Skip sundays ?  ?lidocaine 4 % ?Place 1 patch onto the skin daily. Remove & Discard patch within 12 hours or as directed by MD ?  ?losartan 25 MG tablet ?Commonly known as: COZAAR ?Take 0.5 tablets (12.5 mg total) by mouth daily. Hold for Systolic Blood pressure less than  110. ?  ?magnesium oxide 400 (240 Mg) MG tablet ?Commonly known as: MAG-OX ?Take 1 tablet (400 mg total) by mouth daily. ?  ?melatonin 5 MG Tabs ?Take 1 tablet (5 mg total) by mouth at bedtime. ?  ?montelukast 10 MG tablet ?Commonly known as: SINGULAIR ?Take 1 tablet (10 mg total) by mouth at bedtime. ?  ?multivitamin with minerals Tabs tablet ?Take 1 tablet by mouth daily. ?  ?Myrbetriq 50 MG Tb24 tablet ?Generic drug: mirabegron ER ?TAKE ONE TABLET EVERY DAY ?  ?omeprazole 40 MG capsule ?Commonly known as: PRILOSEC ?Take 1 capsule (40 mg total) by mouth daily. After lunch ?  ?polyethylene glycol powder 17 GM/SCOOP powder ?Commonly known as: GLYCOLAX/MIRALAX ?Take 17 g by mouth daily as needed for moderate constipation. ?  ?potassium chloride SA 20 MEQ tablet ?Commonly known as: KLOR-CON M ?Take 1 tablet (20 mEq total) by mouth 2 (two) times daily. ?  ?pregabalin 75 MG capsule ?Commonly known as: LYRICA ?Take 75 mg by mouth 3 (three) times daily. ?  ?torsemide 20 MG tablet ?Commonly known as: DEMADEX ?Take 2 tablets (40 mg total) by mouth daily. ?  ?traMADol 50 MG tablet ?Commonly known as: ULTRAM ?Take 50 mg by mouth every 6 (six) hours as needed. ?  ?traZODone 50 MG tablet ?Commonly known as: DESYREL ?TAKE 2 TABLETS EVERY DAY 1 HOUR BEFORE BED ?  ?vitamin B-12 1000 MCG tablet ?Commonly known as: CYANOCOBALAMIN ?Take 1 tablet (1,000 mcg total) by  mouth daily. ?  ?Vitamin D3 125 MCG (5000 UT) Tabs ?Take 1 tablet (5,000 Units total) by mouth daily. ?  ? ?  ? ? ?Allergies: No Known Allergies ? ?Family History: ?Family History  ?Problem Relation Age of O

## 2021-11-21 ENCOUNTER — Encounter: Payer: Self-pay | Admitting: Urology

## 2021-11-21 ENCOUNTER — Ambulatory Visit (INDEPENDENT_AMBULATORY_CARE_PROVIDER_SITE_OTHER): Payer: Medicare HMO | Admitting: Urology

## 2021-11-21 VITALS — BP 137/77 | HR 72 | Ht 64.0 in | Wt 160.0 lb

## 2021-11-21 DIAGNOSIS — N3281 Overactive bladder: Secondary | ICD-10-CM

## 2021-11-21 DIAGNOSIS — Z87440 Personal history of urinary (tract) infections: Secondary | ICD-10-CM | POA: Diagnosis not present

## 2021-11-21 LAB — BLADDER SCAN AMB NON-IMAGING

## 2021-11-21 MED ORDER — GEMTESA 75 MG PO TABS: 75.0000 mg | ORAL_TABLET | Freq: Every day | ORAL | 0 refills | Status: DC

## 2021-11-26 DIAGNOSIS — I428 Other cardiomyopathies: Secondary | ICD-10-CM | POA: Diagnosis not present

## 2021-11-28 DIAGNOSIS — R590 Localized enlarged lymph nodes: Secondary | ICD-10-CM | POA: Diagnosis not present

## 2021-11-28 DIAGNOSIS — I509 Heart failure, unspecified: Secondary | ICD-10-CM | POA: Diagnosis not present

## 2021-11-28 DIAGNOSIS — R300 Dysuria: Secondary | ICD-10-CM | POA: Diagnosis not present

## 2021-11-29 DIAGNOSIS — Z8673 Personal history of transient ischemic attack (TIA), and cerebral infarction without residual deficits: Secondary | ICD-10-CM | POA: Diagnosis not present

## 2021-11-29 DIAGNOSIS — K219 Gastro-esophageal reflux disease without esophagitis: Secondary | ICD-10-CM | POA: Diagnosis not present

## 2021-11-29 DIAGNOSIS — R590 Localized enlarged lymph nodes: Secondary | ICD-10-CM | POA: Diagnosis not present

## 2021-11-29 DIAGNOSIS — F32A Depression, unspecified: Secondary | ICD-10-CM | POA: Diagnosis not present

## 2021-11-29 DIAGNOSIS — E039 Hypothyroidism, unspecified: Secondary | ICD-10-CM | POA: Diagnosis not present

## 2021-11-29 DIAGNOSIS — R339 Retention of urine, unspecified: Secondary | ICD-10-CM | POA: Diagnosis not present

## 2021-11-29 DIAGNOSIS — E782 Mixed hyperlipidemia: Secondary | ICD-10-CM | POA: Diagnosis not present

## 2021-11-29 DIAGNOSIS — F419 Anxiety disorder, unspecified: Secondary | ICD-10-CM | POA: Diagnosis not present

## 2021-11-30 ENCOUNTER — Telehealth: Payer: Self-pay | Admitting: Urology

## 2021-11-30 DIAGNOSIS — R221 Localized swelling, mass and lump, neck: Secondary | ICD-10-CM | POA: Diagnosis not present

## 2021-11-30 NOTE — Telephone Encounter (Signed)
Patient's son and daughter-in-law called the office today to report that they were notified by the patient's facility that they placed a catheter because the patient was not able to urinate on her own.  ? ?Family wanted to let you know and inquire about the new medication [facility]).  I advised them that it would be unlikely that the new medication would cause retention, but I would reach out to be sure.  ? ?They are going to the facility today to obtain more information. ?

## 2021-12-03 NOTE — Telephone Encounter (Signed)
[facility] from [facility] calling stating that the Levan Hurst has caused urinary retention per the NP. They started the pt back on myrbetriq and the urine volume is going down????  ?

## 2021-12-04 ENCOUNTER — Telehealth: Payer: Self-pay | Admitting: Family Medicine

## 2021-12-04 NOTE — Telephone Encounter (Signed)
Per Larene Beach If they do not have a bladder scanner at [facility] manner then I would like the voiding trial done here in our office. Spoke to [facility] they do not have a bladder scanner. Appointment has been scheduled for voiding trial.  ?

## 2021-12-04 NOTE — Telephone Encounter (Signed)
The nurse practitioner Kimberly Cook called stating a Catheter was placed on 11/27/2021. Kimberly Cook was stopped on 11/29/21 BMP was elevated so she started Torsemide 20mg  an extra dose at lunch time daily for 5 days. The facility does not have a bladder scanner. She started her back on Myrbetriq 50 mg. Patient is doing much better now. Symptoms have improved. She wants to know if you want her to do a voiding trial at the facility.  ?[facility] call back number   [phone] ?

## 2021-12-05 NOTE — Progress Notes (Signed)
Catheter Removal ? ?Catheter placed by the facility after she went into retention after she was given Gemtesa samples.  She has been switched back to Myrbetriq.   ? ?Patient is present today for a catheter removal.  30 ml of water was drained from the balloon. A 14 FR foley cath was removed from the bladder no complications were noted . Patient tolerated well. ? ?Performed by: Zara Council, PA-C  ? ?Follow up/ Additional notes: She returns this afternoon for a bladder scan.  Her bladder scan notes 326 mL.  She has been pushing fluids and has voided once away from the facility and right before her afternoon appointment.  We will replace the catheter at this time and discontinue her Myrbetriq 50 mg daily.  She will return in two weeks for a repeat TOV.   ? ?Simple Catheter Placement ? ?Due to urinary retention patient is present today for a foley cath placement.  Patient was cleaned and prepped in a sterile fashion with betadine. A 16 FR foley catheter was inserted, urine return was noted  300 ml, urine was yellow in color.  The balloon was filled with 10cc of sterile water.  A leg bag was attached for drainage. Patient was also given a night bag to take home and was given instruction on how to change from one bag to another.  Patient was given instruction on proper catheter care.  Patient tolerated well, no complications were noted  ? ?Performed by: Zara Council, PA-C  ? ?

## 2021-12-05 NOTE — Progress Notes (Incomplete)
12/05/21 ?9:17 PM  ? ?Kimberly Cook ?05-Jul-1946 ?[id number] ? ?Referring provider:  ?Virgie Dad, [address] ?[address] ?[place],   [address] ?No chief complaint on file. ? ? ?Urological history  ?1. rUTI's ?-contributing factors of age, vaginal atrophy, constipation, poor perineal hygiene and incontinence ?-documented positive urine cultures over the last year ?            03/31/2021 Klebsiella pneumoniae ?-managed with cranberry tablets  ?  ?2. OAB ?-contributing factors of age, vaginal atrophy, HTN, neuropathy, lumbar radiculopathy, CVA, diuretics and benzo's  ?-PVR *** ?-managed with Myrbetriq 50 mg daily  ? ? ?HPI: ?Kimberly Cook is a 76 y.o.female who presents today for a PVR.  ? ? ? ? ? ?PMH: ?Past Medical History:  ?Diagnosis Date  ? Allergy   ? Anxiety   ? Arthritis   ? Back pain   ? Chronic HFrEF (heart failure with reduced ejection fraction) ([place])   ? a. 08/2021 echo: EF 30-35%, glob HK, GrI DD; b. 10/2021 Echo: EF 30-35%.  ? CKD (chronic kidney disease), stage II   ? Coronary artery disease   ? a. Mild to moderate CAD in LAD/diagonal by CTA (CT-FFR of apical LAD 0.79); b. 08/2021 Cath: LM nl, LAD min irregs, D1/2/3 nl, LCX nl, OM2/3 nl, RCA 20p, RPDA mild dzs, RPAV nl.  ? DDD (degenerative disc disease), lumbar   ? Depression   ? Diverticulosis   ? Essential hypertension   ? GERD (gastroesophageal reflux disease)   ? Headache   ? History of shingles   ? Hyperlipidemia   ? Hypothyroidism   ? Lung nodule   ? Mini stroke 2011  ? Moderate Pericardial effusion   ? a. 08/2021 Echo: moderate circumferential pericardial effusion w/o tamponade; b. 10/2021 Echo: EF 30-35%, small to mod circumferential pericardial effusion w/o tamponade.  ? NICM (nonischemic cardiomyopathy) ([place])   ? a. 08/2021 Echo: EF 30-35%, glob HK, GrI DD, nl RV fxn, mild-mod dil LA, mod circumferential pericardial eff w/o tamponade, Mod MR; b. 10/2021 Echo: EF 30-35%, glob HK.  ? Occipital neuralgia   ? Palpitations   ?  Pleural effusion on left   ? a. 08/2021 s/p thoracentesis.  ? Pneumonia 2018  ? Prediabetes   ? Stroke [facility])   ? Stroke [facility])   ? MRI 04/2008 + left sup. frontal gyrus possibly puntate infarct   ? Syncope 2019  ? Urinary tract infection   ? Vitamin D deficiency   ? ? ?Surgical History: ?Past Surgical History:  ?Procedure Laterality Date  ? ABDOMINAL HYSTERECTOMY    ? BLADDER SURGERY    ? 2003  ? BREAST EXCISIONAL BIOPSY Right Over 20 years   ? Benign  ? CHOLECYSTECTOMY    ? gastroplication     ? JOINT REPLACEMENT Left   ? KNEE  ? KNEE ARTHROSCOPY Left 2011  ? PULSE GENERATOR IMPLANT Left 01/31/2020  ? Procedure: PLACEMENT RIGHT FLANK PULSE GENERATOR VS REMOVAL SPINAL CORD STIMULATOR;  Surgeon: Deetta Perla, MD;  Location: [facility] ORS;  Service: Neurosurgery;  Laterality: Left;  ? PULSE GENERATOR IMPLANT Left 04/24/2020  ? Procedure: REPLACEMENT LEFT FLANK PULSE GENERATOR IMPLANT;  Surgeon: Deetta Perla, MD;  Location: [facility] ORS;  Service: Neurosurgery;  Laterality: Left;  MAC w/ local  ? right arm fracture    ? RIGHT/LEFT HEART CATH AND CORONARY ANGIOGRAPHY N/A 08/28/2021  ? Procedure: RIGHT/LEFT HEART CATH AND CORONARY ANGIOGRAPHY;  Surgeon: Wellington Hampshire, MD;  Location: [place]  CV LAB;  Service: Cardiovascular;  Laterality: N/A;  ? SPINAL CORD STIMULATOR REMOVAL N/A 06/26/2020  ? Procedure: SPINAL CORD STIMULATOR REMOVAL;  Surgeon: Deetta Perla, MD;  Location: [facility] ORS;  Service: Neurosurgery;  Laterality: N/A;  ? THORACIC LAMINECTOMY FOR SPINAL CORD STIMULATOR N/A 01/24/2020  ? Procedure: THORACIC SPINAL CORD STIMULATOR PADDLE TRIAL VIA LAMINECTOMY;  Surgeon: Deetta Perla, MD;  Location: [facility] ORS;  Service: Neurosurgery;  Laterality: N/A;  ? TONSILLECTOMY AND ADENOIDECTOMY    ? TOTAL KNEE ARTHROPLASTY Left 04/17/2015  ? Procedure: LEFT TOTAL KNEE ARTHROPLASTY;  Surgeon: Paralee Cancel, MD;  Location: [facility];  Service: Orthopedics;  Laterality: Left;  ? ? ?Home Medications:  ?Allergies as of 12/06/2021   ?No Known  Allergies ?  ? ?  ?Medication List  ?  ? ?  ? Accurate as of Dec 05, 2021  9:17 PM. If you have any questions, ask your nurse or doctor.  ?  ?  ? ?  ? ?acetaminophen 500 MG tablet ?Commonly known as: TYLENOL ?Take 1,000 mg by mouth every 8 (eight) hours as needed for moderate pain. ?  ?alendronate 70 MG tablet ?Commonly known as: FOSAMAX ?Take 70 mg by mouth once a week. ?  ?ascorbic acid 500 MG tablet ?Commonly known as: VITAMIN C ?Take 500 mg by mouth daily. ?  ?aspirin EC 81 MG tablet ?Take 1 tablet (81 mg total) by mouth at bedtime. Swallow whole. ?  ?atorvastatin 20 MG tablet ?Commonly known as: LIPITOR ?Take 1 tablet (20 mg total) by mouth daily. ?  ?Biotin 1 MG Caps ?Take 1 mg by mouth daily. ?  ?carvedilol 3.125 MG tablet ?Commonly known as: COREG ?Take 1 tablet (3.125 mg total) by mouth 2 (two) times daily with a meal. Hold if SBP<110 or DBP<60 ?  ?cetirizine 10 MG tablet ?Commonly known as: ZYRTEC ?Take 10 mg by mouth every morning. ?  ?citalopram 10 MG tablet ?Commonly known as: CELEXA ?Take 10 mg by mouth daily. ?  ?Cranberry 450 MG Caps ?Take 1 capsule by mouth daily. ?  ?dapagliflozin propanediol 10 MG Tabs tablet ?Commonly known as: [place] ?Take 1 tablet (10 mg total) by mouth daily before breakfast. ?  ?folic acid 518 MCG tablet ?Commonly known as: FOLVITE ?Take 1 tablet (400 mcg total) by mouth daily. SEPARATE ALL SUPPLEMENTS TO LUNCH OR DINNER AND PRILOSEC NOT TO MESS W/THYROID MED ?  ?Gemtesa 75 MG Tabs ?Generic drug: Vibegron ?Take 75 mg by mouth daily. ?  ?levothyroxine 88 MCG tablet ?Commonly known as: SYNTHROID ?Take 1 tablet (88 mcg total) by mouth daily before breakfast. Skip sundays ?  ?lidocaine 4 % ?Place 1 patch onto the skin daily. Remove & Discard patch within 12 hours or as directed by MD ?  ?losartan 25 MG tablet ?Commonly known as: COZAAR ?Take 0.5 tablets (12.5 mg total) by mouth daily. Hold for Systolic Blood pressure less than 110. ?  ?magnesium oxide 400 (240 Mg) MG  tablet ?Commonly known as: MAG-OX ?Take 1 tablet (400 mg total) by mouth daily. ?  ?melatonin 5 MG Tabs ?Take 1 tablet (5 mg total) by mouth at bedtime. ?  ?montelukast 10 MG tablet ?Commonly known as: SINGULAIR ?Take 1 tablet (10 mg total) by mouth at bedtime. ?  ?multivitamin with minerals Tabs tablet ?Take 1 tablet by mouth daily. ?  ?omeprazole 40 MG capsule ?Commonly known as: PRILOSEC ?Take 1 capsule (40 mg total) by mouth daily. After lunch ?  ?polyethylene glycol powder 17 GM/SCOOP powder ?Commonly known  asDesma Maxim ?Take 17 g by mouth daily as needed for moderate constipation. ?  ?potassium chloride SA 20 MEQ tablet ?Commonly known as: KLOR-CON M ?Take 1 tablet (20 mEq total) by mouth 2 (two) times daily. ?  ?pregabalin 75 MG capsule ?Commonly known as: LYRICA ?Take 75 mg by mouth 3 (three) times daily. ?  ?torsemide 20 MG tablet ?Commonly known as: DEMADEX ?Take 2 tablets (40 mg total) by mouth daily. ?  ?traMADol 50 MG tablet ?Commonly known as: ULTRAM ?Take 50 mg by mouth every 6 (six) hours as needed. ?  ?traZODone 50 MG tablet ?Commonly known as: DESYREL ?TAKE 2 TABLETS EVERY DAY 1 HOUR BEFORE BED ?  ?vitamin B-12 1000 MCG tablet ?Commonly known as: CYANOCOBALAMIN ?Take 1 tablet (1,000 mcg total) by mouth daily. ?  ?Vitamin D3 125 MCG (5000 UT) Tabs ?Take 1 tablet (5,000 Units total) by mouth daily. ?  ? ?  ? ? ?Allergies:  ?No Known Allergies ? ?Family History: ?Family History  ?Problem Relation Age of Onset  ? Heart disease Mother   ? Hypertension Mother   ? Diabetes Mother   ? Heart attack Mother 78  ? Heart disease Father   ? Heart attack Father 78  ? Breast cancer Maternal Aunt   ? Heart attack Brother   ? ? ?Social History:  reports that she has never smoked. She has been exposed to tobacco smoke. She has never used smokeless tobacco. She reports that she does not drink alcohol and does not use drugs. ? ? ?Physical Exam: ?There were no vitals taken for this visit.  ?Constitutional:   Alert and oriented, No acute distress. ?HEENT: [place] AT, moist mucus membranes.  Trachea midline, no masses. ?Cardiovascular: No clubbing, cyanosis, or edema. ?Respiratory: Normal respiratory effort, no increased work of breathin

## 2021-12-06 ENCOUNTER — Ambulatory Visit (INDEPENDENT_AMBULATORY_CARE_PROVIDER_SITE_OTHER): Payer: Medicare HMO | Admitting: Urology

## 2021-12-06 ENCOUNTER — Encounter: Payer: Self-pay | Admitting: Urology

## 2021-12-06 ENCOUNTER — Ambulatory Visit: Payer: Medicare HMO | Admitting: Urology

## 2021-12-06 DIAGNOSIS — Z8673 Personal history of transient ischemic attack (TIA), and cerebral infarction without residual deficits: Secondary | ICD-10-CM | POA: Diagnosis not present

## 2021-12-06 DIAGNOSIS — Z978 Presence of other specified devices: Secondary | ICD-10-CM | POA: Diagnosis not present

## 2021-12-06 DIAGNOSIS — F419 Anxiety disorder, unspecified: Secondary | ICD-10-CM | POA: Diagnosis not present

## 2021-12-06 DIAGNOSIS — I10 Essential (primary) hypertension: Secondary | ICD-10-CM | POA: Diagnosis not present

## 2021-12-06 DIAGNOSIS — R339 Retention of urine, unspecified: Secondary | ICD-10-CM | POA: Diagnosis not present

## 2021-12-06 DIAGNOSIS — M48062 Spinal stenosis, lumbar region with neurogenic claudication: Secondary | ICD-10-CM | POA: Diagnosis not present

## 2021-12-06 DIAGNOSIS — G629 Polyneuropathy, unspecified: Secondary | ICD-10-CM | POA: Diagnosis not present

## 2021-12-06 DIAGNOSIS — M810 Age-related osteoporosis without current pathological fracture: Secondary | ICD-10-CM | POA: Diagnosis not present

## 2021-12-06 LAB — BLADDER SCAN AMB NON-IMAGING

## 2021-12-06 NOTE — Progress Notes (Signed)
Error

## 2021-12-18 ENCOUNTER — Ambulatory Visit: Payer: Medicare HMO | Admitting: Family

## 2021-12-20 ENCOUNTER — Ambulatory Visit: Payer: Medicare HMO | Attending: Family | Admitting: Family

## 2021-12-20 ENCOUNTER — Ambulatory Visit (INDEPENDENT_AMBULATORY_CARE_PROVIDER_SITE_OTHER): Payer: Medicare HMO | Admitting: Physician Assistant

## 2021-12-20 ENCOUNTER — Other Ambulatory Visit
Admission: RE | Admit: 2021-12-20 | Discharge: 2021-12-20 | Disposition: A | Discharge status: Home / Self Care | Payer: Medicare HMO | Source: Ambulatory Visit | Attending: Family | Admitting: Family

## 2021-12-20 ENCOUNTER — Ambulatory Visit: Payer: Medicare HMO | Admitting: Physician Assistant

## 2021-12-20 ENCOUNTER — Encounter: Payer: Self-pay | Admitting: Family

## 2021-12-20 VITALS — BP 114/75 | HR 74 | Resp 16 | Ht 61.0 in | Wt 174.2 lb

## 2021-12-20 DIAGNOSIS — F419 Anxiety disorder, unspecified: Secondary | ICD-10-CM | POA: Insufficient documentation

## 2021-12-20 DIAGNOSIS — Z8673 Personal history of transient ischemic attack (TIA), and cerebral infarction without residual deficits: Secondary | ICD-10-CM | POA: Diagnosis not present

## 2021-12-20 DIAGNOSIS — Z7982 Long term (current) use of aspirin: Secondary | ICD-10-CM | POA: Diagnosis not present

## 2021-12-20 DIAGNOSIS — I10 Essential (primary) hypertension: Secondary | ICD-10-CM | POA: Diagnosis not present

## 2021-12-20 DIAGNOSIS — K5792 Diverticulitis of intestine, part unspecified, without perforation or abscess without bleeding: Secondary | ICD-10-CM | POA: Diagnosis not present

## 2021-12-20 DIAGNOSIS — E559 Vitamin D deficiency, unspecified: Secondary | ICD-10-CM | POA: Diagnosis not present

## 2021-12-20 DIAGNOSIS — K219 Gastro-esophageal reflux disease without esophagitis: Secondary | ICD-10-CM | POA: Insufficient documentation

## 2021-12-20 DIAGNOSIS — E079 Disorder of thyroid, unspecified: Secondary | ICD-10-CM | POA: Insufficient documentation

## 2021-12-20 DIAGNOSIS — F32A Depression, unspecified: Secondary | ICD-10-CM | POA: Diagnosis not present

## 2021-12-20 DIAGNOSIS — N182 Chronic kidney disease, stage 2 (mild): Secondary | ICD-10-CM | POA: Insufficient documentation

## 2021-12-20 DIAGNOSIS — I5022 Chronic systolic (congestive) heart failure: Secondary | ICD-10-CM | POA: Diagnosis not present

## 2021-12-20 DIAGNOSIS — R55 Syncope and collapse: Secondary | ICD-10-CM | POA: Diagnosis not present

## 2021-12-20 DIAGNOSIS — N3281 Overactive bladder: Secondary | ICD-10-CM | POA: Diagnosis not present

## 2021-12-20 DIAGNOSIS — I2510 Atherosclerotic heart disease of native coronary artery without angina pectoris: Secondary | ICD-10-CM | POA: Diagnosis not present

## 2021-12-20 DIAGNOSIS — R609 Edema, unspecified: Secondary | ICD-10-CM | POA: Diagnosis not present

## 2021-12-20 DIAGNOSIS — Z79899 Other long term (current) drug therapy: Secondary | ICD-10-CM | POA: Diagnosis not present

## 2021-12-20 DIAGNOSIS — M5481 Occipital neuralgia: Secondary | ICD-10-CM | POA: Insufficient documentation

## 2021-12-20 DIAGNOSIS — I130 Hypertensive heart and chronic kidney disease with heart failure and stage 1 through stage 4 chronic kidney disease, or unspecified chronic kidney disease: Secondary | ICD-10-CM | POA: Diagnosis not present

## 2021-12-20 DIAGNOSIS — I5042 Chronic combined systolic (congestive) and diastolic (congestive) heart failure: Secondary | ICD-10-CM | POA: Diagnosis not present

## 2021-12-20 DIAGNOSIS — Z7902 Long term (current) use of antithrombotics/antiplatelets: Secondary | ICD-10-CM | POA: Diagnosis not present

## 2021-12-20 DIAGNOSIS — M549 Dorsalgia, unspecified: Secondary | ICD-10-CM | POA: Diagnosis not present

## 2021-12-20 DIAGNOSIS — R339 Retention of urine, unspecified: Secondary | ICD-10-CM

## 2021-12-20 DIAGNOSIS — M5136 Other intervertebral disc degeneration, lumbar region: Secondary | ICD-10-CM | POA: Diagnosis not present

## 2021-12-20 DIAGNOSIS — E785 Hyperlipidemia, unspecified: Secondary | ICD-10-CM | POA: Diagnosis not present

## 2021-12-20 DIAGNOSIS — R0602 Shortness of breath: Secondary | ICD-10-CM | POA: Insufficient documentation

## 2021-12-20 LAB — BLADDER SCAN AMB NON-IMAGING: Scan Result: 141

## 2021-12-20 LAB — BASIC METABOLIC PANEL
Anion gap: 10 (ref 5–15)
BUN: 16 mg/dL (ref 8–23)
CO2: 31 mmol/L (ref 22–32)
Calcium: 9.4 mg/dL (ref 8.9–10.3)
Chloride: 100 mmol/L (ref 98–111)
Creatinine, Ser: 1.22 mg/dL — ABNORMAL HIGH (ref 0.44–1.00)
GFR, Estimated: 46 mL/min — ABNORMAL LOW (ref >60)
Glucose, Bld: 109 mg/dL — ABNORMAL HIGH (ref 70–99)
Potassium: 3.4 mmol/L — ABNORMAL LOW (ref 3.5–5.1)
Sodium: 141 mmol/L (ref 135–145)

## 2021-12-20 NOTE — Patient Instructions (Addendum)
Stay off Myrbetriq and Gemtesa. We're going to do a bladder drug holiday for the next month. I'll see you back next month to check on your urinary symptoms and we'll decide how to proceed at that time.

## 2021-12-20 NOTE — Progress Notes (Signed)
Patient ID: Kimberly Cook, female    DOB: [birth year], 76 y.o.   MRN: [id number]  HPI  Kimberly Cook is a 76 y/o female with a history of CAD, hyperlipidemia, HTN, CKD, stroke, syncope, thyroid disease, anxiety, DDD, depression, diverticulosis, GERD, occipital neuralgia, vitamin D deficiency and chronic heart failure.   Echo report from 10/05/21 reviewed and showed an EF of 30-35% with small/moderate pleural effusion. Echo report from 08/26/21 reviewed and showed an EF of 30-35% along with moderate LAE and moderate MR.   RHC/LHC done 08/28/21 and showed: Prox RCA lesion is 20% stenosed.   1.  Mild nonobstructive coronary artery disease. 2.  Left ventricular angiography was not performed.  EF was 30 to 35% by echo. 3.  Right heart catheterization showed mildly elevated right and left filling pressures, mild pulmonary hypertension and mildly reduced cardiac output.  Admitted 08/25/21 due to shortness of breath and weight gain. Initially given IV lasix with transition to oral diuretics. Cardiology consult obtained. Left thoracentesis done with removal of 543m. BP parameters given for holding losartan, carvedilol and furosemide. PT/OT evaluations done. Discharged after 5 days to SNF.   Kimberly Cook presents today for a follow-up visit with a chief complaint of minimal shortness of breath with moderate exertion. Describes this as chronic in nature having been present for several years. Kimberly Cook has associated cough, pedal edema and back pain along with this. Kimberly Cook denies any difficulty sleeping, dizziness, abdominal distention, palpitations, chest pain, fatigue or weight gain.   Says that the torsemide Kimberly Cook is now taking seems to be making her feel better. Does have oxygen at 2L at bedtime and during the day PRN.  Past Medical History:  Diagnosis Date   Allergy    Anxiety    Arthritis    Back pain    Chronic HFrEF (heart failure with reduced ejection fraction) ([place]    a. 08/2021 echo: EF 30-35%, glob HK, GrI DD;  b. 10/2021 Echo: EF 30-35%.   CKD (chronic kidney disease), stage II    Coronary artery disease    a. Mild to moderate CAD in LAD/diagonal by CTA (CT-FFR of apical LAD 0.79); b. 08/2021 Cath: LM nl, LAD min irregs, D1/2/3 nl, LCX nl, OM2/3 nl, RCA 20p, RPDA mild dzs, RPAV nl.   DDD (degenerative disc disease), lumbar    Depression    Diverticulosis    Essential hypertension    GERD (gastroesophageal reflux disease)    Headache    History of shingles    Hyperlipidemia    Hypothyroidism    Lung nodule    Mini stroke 2011   Moderate Pericardial effusion    a. 08/2021 Echo: moderate circumferential pericardial effusion w/o tamponade; b. 10/2021 Echo: EF 30-35%, small to mod circumferential pericardial effusion w/o tamponade.   NICM (nonischemic cardiomyopathy) ([place]    a. 08/2021 Echo: EF 30-35%, glob HK, GrI DD, nl RV fxn, mild-mod dil LA, mod circumferential pericardial eff w/o tamponade, Mod MR; b. 10/2021 Echo: EF 30-35%, glob HK.   Occipital neuralgia    Palpitations    Pleural effusion on left    a. 08/2021 s/p thoracentesis.   Pneumonia 2018   Prediabetes    Stroke ([facility]    [place]    MRI 04/2008 + left sup. frontal gyrus possibly puntate infarct    Syncope 2019   Urinary tract infection    Vitamin D deficiency    Past Surgical History:  Procedure Laterality Date   ABDOMINAL HYSTERECTOMY  BLADDER SURGERY     2003   BREAST EXCISIONAL BIOPSY Right Over 20 years    Benign   CHOLECYSTECTOMY     gastroplication      JOINT REPLACEMENT Left    KNEE   KNEE ARTHROSCOPY Left 2011   PULSE GENERATOR IMPLANT Left 01/31/2020   Procedure: PLACEMENT RIGHT FLANK PULSE GENERATOR VS REMOVAL SPINAL CORD STIMULATOR;  Surgeon: Deetta Perla, MD;  Location: [facility] OR[facility];  Service: Neurosurgery;  Laterality: Left;   PULSE GENERATOR IMPLANT Left 04/24/2020   Procedure: REPLACEMENT LEFT FLANK PULSE GENERATOR IMPLANT;  Surgeon: Deetta Perla, MD;  Location: [facility];  Service: Neurosurgery;   Laterality: Left;  MAC w/ local   right arm fracture     RIGHT/LEFT HEART CATH AND CORONARY ANGIOGRAPHY N/A 08/28/2021   Procedure: RIGHT/LEFT HEART CATH AND CORONARY ANGIOGRAPHY;  Surgeon: Wellington Hampshire, MD;  Location: [place] CV LAB;  Service: Cardiovascular;  Laterality: N/A;   SPINAL CORD STIMULATOR REMOVAL N/A 06/26/2020   Procedure: SPINAL CORD STIMULATOR REMOVAL;  Surgeon: Deetta Perla, MD;  Location: [facility] OR[facility];  Service: Neurosurgery;  Laterality: N/A;   THORACIC LAMINECTOMY FOR SPINAL CORD STIMULATOR N/A 01/24/2020   Procedure: THORACIC SPINAL CORD STIMULATOR PADDLE TRIAL VIA LAMINECTOMY;  Surgeon: Deetta Perla, MD;  Location: [facility];  Service: Neurosurgery;  Laterality: N/A;   TONSILLECTOMY AND ADENOIDECTOMY     TOTAL KNEE ARTHROPLASTY Left 04/17/2015   Procedure: LEFT TOTAL KNEE ARTHROPLASTY;  Surgeon: Paralee Cancel, MD;  Location: [facility];  Service: Orthopedics;  Laterality: Left;   Family History  Problem Relation Age of Onset   Heart disease Mother    Hypertension Mother    Diabetes Mother    Heart attack Mother 64   Heart disease Father    Heart attack Father 20   Breast cancer Maternal Aunt    Heart attack Brother    Social History   Tobacco Use   Smoking status: Never    Passive exposure: Yes   Smokeless tobacco: Never   Tobacco comments:    husbands and children smoked in home.   Substance Use Topics   Alcohol use: No   No Known Allergies  Prior to Admission medications   Medication Sig Start Date End Date Taking? Authorizing Provider  acetaminophen (TYLENOL) 500 MG tablet Take 1,000 mg by mouth every 8 (eight) hours as needed for moderate pain.   Yes [provider]  alendronate (FOSAMAX) 70 MG tablet Take 70 mg by mouth once a week.  10/23/19  Yes [provider]  ascorbic acid (VITAMIN C) 500 MG tablet Take 500 mg by mouth daily.   Yes [provider]  aspirin EC 81 MG tablet Take 1 tablet (81 mg total) by mouth at bedtime.  Swallow whole. 10/25/20  Yes McLean-Scocuzza, Nino Glow, MD  atorvastatin (LIPITOR) 20 MG tablet Take 1 tablet (20 mg total) by mouth daily. 06/20/20  Yes McLean-Scocuzza, Nino Glow, MD  Biotin 1 MG CAPS Take 1 mg by mouth daily.    Yes [provider]  carvedilol (COREG) 3.125 MG tablet Take 1 tablet (3.125 mg total) by mouth 2 (two) times daily with a meal. Hold if SBP<110 or DBP<60 08/30/21  Yes Nicole Kindred A, DO  cetirizine (ZYRTEC) 10 MG tablet Take 10 mg by mouth every morning.   Yes [provider]  Cholecalciferol (VITAMIN D3) 125 MCG (5000 UT) TABS Take 1 tablet (5,000 Units total) by mouth daily. 10/09/18  Yes McLean-Scocuzza, Nino Glow, MD  citalopram (  CELEXA) 10 MG tablet Take 10 mg by mouth daily.   Yes [provider]  Cranberry 450 MG CAPS Take 1 capsule by mouth daily.   Yes [provider]  dapagliflozin propanediol (FARXIGA) 10 MG TABS tablet Take 1 tablet (10 mg total) by mouth daily before breakfast. 09/11/21  Yes Theora Gianotti, NP  folic acid (FOLVITE) 833 MCG tablet Take 1 tablet (400 mcg total) by mouth daily. SEPARATE ALL SUPPLEMENTS TO LUNCH OR DINNER AND PRILOSEC NOT TO MESS W/THYROID MED 12/10/18  Yes McLean-Scocuzza, Nino Glow, MD  levothyroxine (SYNTHROID) 88 MCG tablet Take 1 tablet (88 mcg total) by mouth daily before breakfast. Skip sundays 12/02/19  Yes McLean-Scocuzza, Nino Glow, MD  lidocaine 4 % Place 1 patch onto the skin daily. Remove & Discard patch within 12 hours or as directed by MD   Yes [provider]  losartan (COZAAR) 25 MG tablet Take 0.5 tablets (12.5 mg total) by mouth daily. Hold for Systolic Blood pressure less than 110. 10/18/21  Yes Theora Gianotti, NP  magnesium oxide (MAG-OX) 400 (240 Mg) MG tablet Take 1 tablet (400 mg total) by mouth daily. 08/31/21  Yes Nicole Kindred A, DO  melatonin 5 MG TABS Take 1 tablet (5 mg total) by mouth at bedtime. 04/04/21  Yes Val Riles, MD  montelukast  (SINGULAIR) 10 MG tablet Take 1 tablet (10 mg total) by mouth at bedtime. 02/14/20  Yes McLean-Scocuzza, Nino Glow, MD  Multiple Vitamin (MULTIVITAMIN WITH MINERALS) TABS tablet Take 1 tablet by mouth daily.   Yes [provider]  omeprazole (PRILOSEC) 40 MG capsule Take 1 capsule (40 mg total) by mouth daily. After lunch 06/23/20  Yes McLean-Scocuzza, Nino Glow, MD  polyethylene glycol powder (GLYCOLAX/MIRALAX) 17 GM/SCOOP powder Take 17 g by mouth daily as needed for moderate constipation. 10/21/19  Yes McLean-Scocuzza, Nino Glow, MD  potassium chloride SA (KLOR-CON M) 20 MEQ tablet Take 1 tablet (20 mEq total) by mouth 2 (two) times daily. 08/30/21  Yes Nicole Kindred A, DO  pregabalin (LYRICA) 75 MG capsule Take 75 mg by mouth 3 (three) times daily.   Yes [provider]  torsemide (DEMADEX) 20 MG tablet Take 2 tablets (40 mg total) by mouth daily. 11/12/21 02/10/22 Yes Shigeo Baugh, Aura Fey, FNP  traMADol (ULTRAM) 50 MG tablet Take 50 mg by mouth every 6 (six) hours as needed.   Yes [provider]  traZODone (DESYREL) 100 MG tablet Take by mouth. 12/13/21  Yes [provider]  vitamin B-12 (CYANOCOBALAMIN) 1000 MCG tablet Take 1 tablet (1,000 mcg total) by mouth daily. 10/09/18  Yes McLean-Scocuzza, Nino Glow, MD  furosemide (LASIX) 40 MG tablet Take by mouth. Patient not taking: Reported on 12/20/2021 12/13/21   [provider]   'Review of Systems  Constitutional:  Negative for appetite change and fatigue.  HENT:  Negative for congestion, postnasal drip and sore throat.   Eyes: Negative.   Respiratory:  Positive for cough (dry) and shortness of breath. Negative for chest tightness.   Cardiovascular:  Positive for leg swelling ("very little"). Negative for chest pain and palpitations.  Gastrointestinal:  Negative for abdominal distention and abdominal pain.  Endocrine: Negative.   Genitourinary: Negative.   Musculoskeletal:  Positive for back pain (chronic due to 4  rods in back). Negative for neck pain.  Skin: Negative.   Allergic/Immunologic: Negative.   Neurological:  Negative for dizziness and light-headedness.  Hematological:  Negative for adenopathy. Does not bruise/bleed easily.  Psychiatric/Behavioral:  Negative for dysphoric mood and sleep disturbance (sleeping on 1 pillow). The patient is not nervous/anxious.  /  Vitals:   12/20/21 1127  BP: 114/75  Pulse: 74  Resp: 16  SpO2: 99%  Weight: 174 lb 4 oz (79 kg)  Height: _0  (1.549 m)   Wt Readings from Last 3 Encounters:  12/20/21 174 lb 4 oz (79 kg)  11/21/21 160 lb (72.6 kg)  11/12/21 169 lb 2 oz (76.7 kg)   Lab Results  Component Value Date   CREATININE 1.02 (H) 09/11/2021   CREATININE 0.82 08/30/2021   CREATININE 0.90 08/29/2021   Physical Exam Vitals and nursing note reviewed. Exam conducted with a chaperone present (friend).  Constitutional:      Appearance: Kimberly Cook is well-developed.  HENT:     Head: Normocephalic and atraumatic.  Cardiovascular:     Rate and Rhythm: Normal rate and regular rhythm.  Pulmonary:     Effort: Pulmonary effort is normal. No respiratory distress.     Breath sounds: No wheezing or rales.  Abdominal:     General: There is no distension.     Palpations: Abdomen is soft.     Tenderness: There is no abdominal tenderness.  Musculoskeletal:     Cervical back: Normal range of motion and neck supple.     Right lower leg: Edema (trace pitting) present.     Left lower leg: No tenderness. Edema (trace pitting) present.  Skin:    General: Skin is warm and dry.  Neurological:     General: No focal deficit present.     Mental Status: Kimberly Cook is alert and oriented to person, place, and time.  Psychiatric:        Mood and Affect: Mood normal.        Behavior: Behavior normal.    Assessment & Plan:  1: Chronic heart failure with reduced ejection fraction- - NYHA class II - euvolemic today - being weighed daily; reminded to call for an overnight weight  gain of > 2 pounds or a weekly weight gain of >5 pounds - weight up 5 pounds from last visit here 1 month ago - currently not adding salt except to grits; reviewed the importance of not adding any salt to her food if possible - on GDMT of carvedilol, farxiga & losartan  - diuretic changed to torsemide at last visit - will draw BMP today as facility didn't drawn it - encouraged her to get looser fitting socks or cut the sock band since it's so tight - BNP 08/25/21 was 353.9  2: HTN- - BP looks good (114/75) - currently seeing PCP at facility - [facility] 09/11/21 reviewed and showed sodium 141, potassium 4.7, creatinine 1.02 and GFR 57  3: CAD- - saw cardiology Sharolyn Douglas) 10/17/21 - currently taking aspirin, atorvastatin   Facility medication list reviewed.   Return in 3 months, sooner if needed.

## 2021-12-20 NOTE — Patient Instructions (Signed)
Continue weighing daily and call for an overnight weight gain of 3 pounds or more or a weekly weight gain of more than 5 pounds.  °

## 2021-12-20 NOTE — Progress Notes (Signed)
Catheter Removal  Patient is present today for a catheter removal.  10 ml of water was drained from the balloon. A 16FR foley cath was removed from the bladder no complications were noted . Patient tolerated well.  Performed by: Lucia Bitter  Follow up/ Additional notes: pt scheduled this afternoon for PVR.

## 2021-12-20 NOTE — Progress Notes (Signed)
12/20/2021 4:41 PM   Kimberly Cook 11-20-1945 [id number]  CC: Chief Complaint  Patient presents with   Urinary Retention   HPI: Kimberly Cook is a 76 y.o. female with PMH recurrent UTI, vaginal atrophy, and OAB who developed urinary retention on Gemtesa who presents today for repeat voiding trial off of beta 3 agonists.  Foley catheter removed in the morning, see separate procedure note for details.  She reports drinking approximately 24 ounces of fluid during the day and has been able to urinate without difficulty.  Afternoon PVR 141 mL.  PMH: Past Medical History:  Diagnosis Date   Allergy    Anxiety    Arthritis    Back pain    Chronic HFrEF (heart failure with reduced ejection fraction) ([place])    a. 08/2021 echo: EF 30-35%, glob HK, GrI DD; b. 10/2021 Echo: EF 30-35%.   CKD (chronic kidney disease), stage II    Coronary artery disease    a. Mild to moderate CAD in LAD/diagonal by CTA (CT-FFR of apical LAD 0.79); b. 08/2021 Cath: LM nl, LAD min irregs, D1/2/3 nl, LCX nl, OM2/3 nl, RCA 20p, RPDA mild dzs, RPAV nl.   DDD (degenerative disc disease), lumbar    Depression    Diverticulosis    Essential hypertension    GERD (gastroesophageal reflux disease)    Headache    History of shingles    Hyperlipidemia    Hypothyroidism    Lung nodule    Mini stroke 2011   Moderate Pericardial effusion    a. 08/2021 Echo: moderate circumferential pericardial effusion w/o tamponade; b. 10/2021 Echo: EF 30-35%, small to mod circumferential pericardial effusion w/o tamponade.   NICM (nonischemic cardiomyopathy) ([place])    a. 08/2021 Echo: EF 30-35%, glob HK, GrI DD, nl RV fxn, mild-mod dil LA, mod circumferential pericardial eff w/o tamponade, Mod MR; b. 10/2021 Echo: EF 30-35%, glob HK.   Occipital neuralgia    Palpitations    Pleural effusion on left    a. 08/2021 s/p thoracentesis.   Pneumonia 2018   Prediabetes    Stroke [facility])    Stroke ([place])    MRI 04/2008 + left sup. frontal  gyrus possibly puntate infarct    Syncope 2019   Urinary tract infection    Vitamin D deficiency     Surgical History: Past Surgical History:  Procedure Laterality Date   ABDOMINAL HYSTERECTOMY     BLADDER SURGERY     2003   BREAST EXCISIONAL BIOPSY Right Over 20 years    Benign   CHOLECYSTECTOMY     gastroplication      JOINT REPLACEMENT Left    KNEE   KNEE ARTHROSCOPY Left 2011   PULSE GENERATOR IMPLANT Left 01/31/2020   Procedure: PLACEMENT RIGHT FLANK PULSE GENERATOR VS REMOVAL SPINAL CORD STIMULATOR;  Surgeon: Deetta Perla, MD;  Location: [facility] OR[facility];  Service: Neurosurgery;  Laterality: Left;   PULSE GENERATOR IMPLANT Left 04/24/2020   Procedure: REPLACEMENT LEFT FLANK PULSE GENERATOR IMPLANT;  Surgeon: Deetta Perla, MD;  Location: [facility];  Service: Neurosurgery;  Laterality: Left;  MAC w/ local   right arm fracture     RIGHT/LEFT HEART CATH AND CORONARY ANGIOGRAPHY N/A 08/28/2021   Procedure: RIGHT/LEFT HEART CATH AND CORONARY ANGIOGRAPHY;  Surgeon: Wellington Hampshire, MD;  Location: [place] [facility];  Service: Cardiovascular;  Laterality: N/A;   SPINAL CORD STIMULATOR REMOVAL N/A 06/26/2020   Procedure: SPINAL CORD STIMULATOR REMOVAL;  Surgeon: Deetta Perla, MD;  Location:  [facility];  Service: Neurosurgery;  Laterality: N/A;   THORACIC LAMINECTOMY FOR SPINAL CORD STIMULATOR N/A 01/24/2020   Procedure: THORACIC SPINAL CORD STIMULATOR PADDLE TRIAL VIA LAMINECTOMY;  Surgeon: Deetta Perla, MD;  Location: [facility];  Service: Neurosurgery;  Laterality: N/A;   TONSILLECTOMY AND ADENOIDECTOMY     TOTAL KNEE ARTHROPLASTY Left 04/17/2015   Procedure: LEFT TOTAL KNEE ARTHROPLASTY;  Surgeon: Paralee Cancel, MD;  Location: [facility];  Service: Orthopedics;  Laterality: Left;    Home Medications:  Allergies as of 12/20/2021   No Known Allergies      Medication List        Accurate as of Dec 20, 2021  4:41 PM. If you have any questions, ask your nurse or doctor.          STOP taking  these medications    Gemtesa 75 MG Tabs Generic drug: Vibegron Stopped by: Debroah Loop, PA-C   Myrbetriq 50 MG Tb24 tablet Generic drug: mirabegron ER Stopped by: Debroah Loop, PA-C       TAKE these medications    acetaminophen 500 MG tablet Commonly known as: TYLENOL Take 1,000 mg by mouth every 8 (eight) hours as needed for moderate pain.   alendronate 70 MG tablet Commonly known as: FOSAMAX Take 70 mg by mouth once a week.   ascorbic acid 500 MG tablet Commonly known as: VITAMIN C Take 500 mg by mouth daily.   aspirin EC 81 MG tablet Take 1 tablet (81 mg total) by mouth at bedtime. Swallow whole.   atorvastatin 20 MG tablet Commonly known as: LIPITOR Take 1 tablet (20 mg total) by mouth daily.   Biotin 1 MG Caps Take 1 mg by mouth daily.   carvedilol 3.125 MG tablet Commonly known as: COREG Take 1 tablet (3.125 mg total) by mouth 2 (two) times daily with a meal. Hold if SBP<110 or DBP<60   cetirizine 10 MG tablet Commonly known as: ZYRTEC Take 10 mg by mouth every morning.   citalopram 10 MG tablet Commonly known as: CELEXA Take 10 mg by mouth daily.   Cranberry 450 MG Caps Take 1 capsule by mouth daily.   dapagliflozin propanediol 10 MG Tabs tablet Commonly known as: Farxiga Take 1 tablet (10 mg total) by mouth daily before breakfast.   folic acid 397 MCG tablet Commonly known as: FOLVITE Take 1 tablet (400 mcg total) by mouth daily. SEPARATE ALL SUPPLEMENTS TO LUNCH OR DINNER AND PRILOSEC NOT TO MESS W/THYROID MED   furosemide 40 MG tablet Commonly known as: LASIX Take by mouth.   levothyroxine 88 MCG tablet Commonly known as: SYNTHROID Take 1 tablet (88 mcg total) by mouth daily before breakfast. Skip sundays   lidocaine 4 % Place 1 patch onto the skin daily. Remove & Discard patch within 12 hours or as directed by MD   losartan 25 MG tablet Commonly known as: COZAAR Take 0.5 tablets (12.5 mg total) by mouth daily. Hold  for Systolic Blood pressure less than 110.   magnesium oxide 400 (240 Mg) MG tablet Commonly known as: MAG-OX Take 1 tablet (400 mg total) by mouth daily.   melatonin 5 MG Tabs Take 1 tablet (5 mg total) by mouth at bedtime.   montelukast 10 MG tablet Commonly known as: SINGULAIR Take 1 tablet (10 mg total) by mouth at bedtime.   multivitamin with minerals Tabs tablet Take 1 tablet by mouth daily.   omeprazole 40 MG capsule Commonly known as: PRILOSEC Take 1 capsule (40 mg total)  by mouth daily. After lunch   polyethylene glycol powder 17 GM/SCOOP powder Commonly known as: GLYCOLAX/MIRALAX Take 17 g by mouth daily as needed for moderate constipation.   potassium chloride SA 20 MEQ tablet Commonly known as: KLOR-CON M Take 1 tablet (20 mEq total) by mouth 2 (two) times daily.   pregabalin 75 MG capsule Commonly known as: LYRICA Take 75 mg by mouth 3 (three) times daily.   torsemide 20 MG tablet Commonly known as: DEMADEX Take 2 tablets (40 mg total) by mouth daily.   traMADol 50 MG tablet Commonly known as: ULTRAM Take 50 mg by mouth every 6 (six) hours as needed.   traZODone 100 MG tablet Commonly known as: DESYREL Take by mouth. What changed: Another medication with the same name was removed. Continue taking this medication, and follow the directions you see here. Changed by: Debroah Loop, PA-C   vitamin B-12 1000 MCG tablet Commonly known as: CYANOCOBALAMIN Take 1 tablet (1,000 mcg total) by mouth daily.   Vitamin D3 125 MCG (5000 UT) Tabs Take 1 tablet (5,000 Units total) by mouth daily.        Allergies:  No Known Allergies  Family History: Family History  Problem Relation Age of Onset   Heart disease Mother    Hypertension Mother    Diabetes Mother    Heart attack Mother 87   Heart disease Father    Heart attack Father 75   Breast cancer Maternal Aunt    Heart attack Brother     Social History:   reports that she has never  smoked. She has been exposed to tobacco smoke. She has never used smokeless tobacco. She reports that she does not drink alcohol and does not use drugs.  Physical Exam: There were no vitals taken for this visit.  Constitutional:  Alert and oriented, no acute distress, nontoxic appearing HEENT: [place], AT Cardiovascular: No clubbing, cyanosis, or edema Respiratory: Normal respiratory effort, no increased work of breathing Skin: No rashes, bruises or suspicious lesions Neurologic: Grossly intact, no focal deficits, moving all 4 extremities Psychiatric: Normal mood and affect  Laboratory Data: Results for orders placed or performed in visit on 12/20/21  BLADDER SCAN AMB NON-IMAGING  Result Value Ref Range   Scan Result 141 ml    Assessment & Plan:   1. Urinary retention Voiding trial passed today.  We discussed staying off urologic agents for the next month for a drug holiday.  We will reassess her urinary symptoms at that time and consider cautiously reintroducing medications as indicated. - BLADDER SCAN AMB NON-IMAGING  Return in about 4 weeks (around 01/17/2022) for Symptom recheck with PVR.  Debroah Loop, PA-C  [facility] [address] [place], [place] [address] [phone]

## 2021-12-21 ENCOUNTER — Telehealth: Payer: Self-pay | Admitting: Family

## 2021-12-21 ENCOUNTER — Telehealth: Payer: Self-pay

## 2021-12-21 MED ORDER — TORSEMIDE 20 MG PO TABS: ORAL_TABLET | ORAL | 3 refills | Status: DC

## 2021-12-21 NOTE — Telephone Encounter (Signed)
BMP results from yesterday reviewed. Potassium slightly low at 3.4 and renal function has worsened. Creatinine now 1.22 (was 1.02) and GFR now 46 (was 57). She was taking 40mg  torsemide daily. Will change torsemide to 40mg  on M,W,F & Sat and 20mg  on T, TH & Sunday.   This order was faxed to [facility]. Also placed order for facility to draw BMP on 01/21/22 & fax results to Monday.

## 2021-12-21 NOTE — Telephone Encounter (Signed)
LPN from [facility] calls and states that the patient is c/o bloating and having to push her abdomen to void. Nurse states that the patient does not want to have a foley catheter replaced. No bladder scanner is available at the home for a PVR. Per verbal orders from Hilton Sinclair, [place] nurse is instructed to in and out cath the patient, measure urine volume to determine next steps. If greater than nurse to replace foley cath, less than ok to proceed with pt voiding on her own. Nurse voiced understanding.

## 2021-12-24 DIAGNOSIS — F4323 Adjustment disorder with mixed anxiety and depressed mood: Secondary | ICD-10-CM | POA: Diagnosis not present

## 2021-12-25 ENCOUNTER — Telehealth: Payer: Self-pay | Admitting: Physician Assistant

## 2021-12-25 DIAGNOSIS — E782 Mixed hyperlipidemia: Secondary | ICD-10-CM | POA: Diagnosis not present

## 2021-12-25 DIAGNOSIS — K219 Gastro-esophageal reflux disease without esophagitis: Secondary | ICD-10-CM | POA: Diagnosis not present

## 2021-12-25 DIAGNOSIS — E039 Hypothyroidism, unspecified: Secondary | ICD-10-CM | POA: Diagnosis not present

## 2021-12-25 DIAGNOSIS — R339 Retention of urine, unspecified: Secondary | ICD-10-CM | POA: Diagnosis not present

## 2021-12-25 DIAGNOSIS — R590 Localized enlarged lymph nodes: Secondary | ICD-10-CM | POA: Diagnosis not present

## 2021-12-25 DIAGNOSIS — F419 Anxiety disorder, unspecified: Secondary | ICD-10-CM | POA: Diagnosis not present

## 2021-12-25 DIAGNOSIS — I509 Heart failure, unspecified: Secondary | ICD-10-CM | POA: Diagnosis not present

## 2021-12-25 DIAGNOSIS — I10 Essential (primary) hypertension: Secondary | ICD-10-CM | POA: Diagnosis not present

## 2021-12-25 NOTE — Telephone Encounter (Signed)
Pt's nurse from [place], Ms. Langston Masker [facility] that they had to put foley back in on Friday due to retention.  She said they need a DX code for Neurogenic Bladder or Obstructive Neuropathy in order to keep foley in.  Can someone please give her a call at [phone], Ms. Morris, LPN, Associate Professor on [employer].

## 2021-12-25 NOTE — Telephone Encounter (Signed)
Spoke with LPN, who did not want to speak with a CMA, she needed to speak with a Nurse or MD. She states per CMS guidelines, they can not use urinary retention as a dx code, they would have to do in/out cath's and per LPN that would cause more UTI's. I advised per the telephone note 12/21/21 that is what the PA advised. I advised LPN I could not dx the pt with something not in her chart. Please advise

## 2021-12-26 ENCOUNTER — Ambulatory Visit: Payer: Medicare HMO | Admitting: Urology

## 2021-12-26 NOTE — Telephone Encounter (Signed)
Contacted [place] [phone])  to follow up with patient questions per [place] with patient's nurse Charlesetta Garibaldi who then transferred me to [facility], who had previously called regarding a new diagnosis code of neurogenic bladder. She stated that the NP at the facility had added this to the patient's chart for them. It was stated that the reason for this call was to inquire about the patient's current status. There was no return call after verbal orders were given on 12/21/21:  Per verbal orders from Hilton Sinclair, [place] nurse is instructed to in and out cath the patient, measure urine volume to determine next steps. If greater than nurse to replace foley cath, less than ok to proceed with pt voiding on her own. Nurse voiced understanding.   Devita states that the patient currently has an indwelling foley. Residual from foley placement was requested, she states that it was 200cc. Patient had a significant incontinence episode prior to placement that required a bed linen change. The residual of did not indicate a foley placement per verbal order. It was inquired as to why the foley was placed. Unable to give an answer and would like to know if the catheter should be removed. She requested faxed orders not verbal orders be sent to [phone].

## 2021-12-26 NOTE — Telephone Encounter (Signed)
1. What was her residual when they replaced the Foley on Friday?  2. They may use a diagnosis code of neurogenic bladder.  3. As long as they are using proper clean technique, in and out catheterization does not increase the risk for UTI over an indwelling Foley catheter. Either option is appropriate for management of urinary retention in this patient, per the patient's preference. Per Sam:

## 2021-12-26 NOTE — Telephone Encounter (Signed)
Spoke with LPN at [facility] and the NP already gave them a dx of neurogenic bladder.

## 2021-12-27 ENCOUNTER — Telehealth: Payer: Self-pay | Admitting: Family

## 2021-12-27 NOTE — Telephone Encounter (Signed)
Nurse Leona Singleton called from [facility] to report a weight gain. Patient weighed 169.6 pounds yesterday and today she weighed 173.6 pounds. No change in her shortness of breath/ edema.   Takes 40mg  torsemide on M,W,F, Sat and 20mg  T,TH, and Sun so she took 20mg  today. Advised the nurse to give an additional 20mg  today.   She verbalized understanding.

## 2021-12-28 DIAGNOSIS — I5021 Acute systolic (congestive) heart failure: Secondary | ICD-10-CM | POA: Diagnosis not present

## 2021-12-28 DIAGNOSIS — I517 Cardiomegaly: Secondary | ICD-10-CM | POA: Diagnosis not present

## 2021-12-28 DIAGNOSIS — R140 Abdominal distension (gaseous): Secondary | ICD-10-CM | POA: Diagnosis not present

## 2021-12-28 NOTE — Telephone Encounter (Signed)
I spoke with NP Di Kindle at [facility] yesterday via telephone. I expressed my concern that my verbal orders were not followed by [facility] staff and gave verbal orders to remove Ms. Druschel' foley catheter at this time. I explained that given that she did not have recurrent urinary retention, I do not feel a diagnosis of neurogenic bladder is appropriate and her Foley catheter should not have been placed in the first place.   We discussed that Ms. Horgen has a known history of urinary incontinence that I anticipate will continue, especially for the next month while I have her on a bladder medication holiday. We confirmed her follow-up appointment information with me next month, at which point we may consider cautiously restarting bladder medications.  Di Kindle shared with me that Ms. Sullen' cardiologist has expressed concern for volume overload in this patient with known CHF, as she is up 5 pounds. I explained that her chief complaints on Friday were bloating and weight increase of 2 pounds and that by violating my verbal orders regarding Foley placement, her cardiac care was likely delayed. Di Kindle stated she will address these issues with her staff.

## 2022-01-02 NOTE — Progress Notes (Unsigned)
Follow-up Outpatient Visit Date: 01/04/2022  Primary Care Provider: Virgie Dad, [address] [place] [address]  Chief Complaint: Follow-up heart failure  HPI:  Ms. Kalisch is a 76 y.o. female with history of moderate coronary artery disease by cardiac CTA, hypertension, hyperlipidemia, "mini strokes," GERD, depression, and anxiety, who presents for follow-up of nonobstructive coronary artery disease and HFrEF.  I last saw her in early December, at which time she was recovering from back surgery.  She reported some exertional dyspnea and leg edema but otherwise was feeling well.  She was hospitalized in January with worsening shortness of breath and leg swelling.  Echo at that time showed newly reduced LVEF of 30-35% with moderate mitral regurgitation and moderate pericardial effusion.  She underwent cardiac catheterization that showed mild proximal RCA disease but otherwise no significant stenosis.  She underwent thoracentesis and escalation of her diuresis.  At her most recent follow-up visit with Ignacia Bayley, NP, in early February, she was improving with minimal leg edema and resolution of her shortness of breath.  She last saw Darylene Price on 12/20/2021, at which time Ms. Hackney documented "minimal shortness of breath with moderate exertion."  No medication changes were made though weight had increased 14 pounds from a month earlier.  Today, Ms. Buccellato remains concerned about continued weight gain.  She feels like she is still accumulating fluid, with some leg swelling and abdominal distention.  She notes stable exertional dyspnea and orthopnea.  She is ambulating with a walker at her rehab facility; it is unclear if/when she may be able to return home.  She denies chest pain and palpitations.  She has not fallen but occasionally feels dizzy.  She remains complaint with her medications, though she notes that losartan is frequently being held at her rehab facility because  of hold parameters for BP less than 110/60.  She has struggled with urinary retention that required a Foley catheter.  This has improved since stopping Myrbetriq such that she has been without the catheter for ~2 weeks.  --------------------------------------------------------------------------------------------------  Cardiovascular History & Procedures: Cardiovascular Problems: Orthostatic lightheadedness and labile blood pressure Dyspnea on exertion   Risk Factors: Hypertension, hyperlipidemia, family history, sedentary lifestyle, and age greater than 68   Cath/PCI: R/LHC (08/28/2021): LMCA, LAD, and LCx normal.  RCA with 20% proximal stenosis.  Mildly elevated right heart, left heart, and pulmonary artery pressures.  Mildly reduced cardiac output.   CV Surgery: None   EP Procedures and Devices: 14-day event monitor (04/24/17): Sinus rhythm without arrhythmias. 48 hour Holter monitor (03/20/17): Predominantly sinus rhythm with occasional PACs and rare PVCs. Brief episode of blocked PACs versus 2:1 AV block.   Non-Invasive Evaluation(s): Limited TTE 10/05/2021): Mildly dilated LV with LVEF 30-35%.  Normal RV size and function.  Small to moderate-sized pericardial effusion.  No significant mitral regurgitation.  Aortic sclerosis present. TTE (08/26/2021): Mildly dilated LV with LVEF 30-35% and grade 1 diastolic dysfunction.  Normal RV size and function.  Moderate pericardial effusion without tamponade physiology.  Moderate pleural effusion.  Moderate mitral regurgitation.  Aortic sclerosis without stenosis. Pharmacologic MPI (09/13/2020): Low risk study without ischemia or scar.  Calculated LVEF of 36% artifactually low with normal LVEF by visual estimation. Renal artery Doppler (12/26/17): No evidence of renal artery stenosis.  Incidental note made of 2.1 cm left renal cyst. Carotid Doppler (09/08/17): Minimal plaquing of both carotid arteries.  No significant stenosis.  Patent vertebral arteries  with antegrade flow bilaterally.  Normal  hemodynamics in both subclavian arteries. Coronary CTA (04/30/17): Mild to moderate coronary artery disease proximal LAD and ostium of small D2.  CT FFR of distal most LAD is 0.79.  Coronary calcium score 257.  Small left lower lobe nodule (3 mm). TTE (03/20/17): Normal LV size with mild focal basal hypertrophy of the septum. LVEF 50-55% with normal wall motion. Grade 1 diastolic dysfunction. Mitral annular calcification present. Normal RV size and function. Normal pulmonary artery pressure. Pharmacologic MPI (12/14/15): Low risk without ischemia or scar. LVEF 68%. TTE (12/14/15): Normal LV size and wall thickness. LVEF 55-60%. Normal RV size and function. No significant valvular abnormalities.  Recent CV Pertinent Labs: Lab Results  Component Value Date   CHOL 125 11/24/2019   CHOL 119 08/22/2017   HDL 46.40 11/24/2019   HDL 57 08/22/2017   LDLCALC 47 11/24/2019   LDLCALC 46 08/22/2017   TRIG 158.0 (H) 11/24/2019   CHOLHDL 3 11/24/2019   INR 1.0 03/31/2021   BNP 353.9 (H) 08/25/2021   K 3.4 (L) 12/20/2021   MG 1.8 08/30/2021   BUN 16 12/20/2021   BUN 20 09/11/2021   CREATININE 1.22 (H) 12/20/2021   CREATININE 1.18 (H) 01/07/2017    Past medical and surgical history were reviewed and updated in EPIC.  Current Meds  Medication Sig   acetaminophen (TYLENOL) 500 MG tablet Take 1,000 mg by mouth every 8 (eight) hours as needed for moderate pain.   alendronate (FOSAMAX) 70 MG tablet Take 70 mg by mouth once a week.    ascorbic acid (VITAMIN C) 500 MG tablet Take 500 mg by mouth daily.   aspirin EC 81 MG tablet Take 1 tablet (81 mg total) by mouth at bedtime. Swallow whole.   atorvastatin (LIPITOR) 20 MG tablet Take 1 tablet (20 mg total) by mouth daily.   Biotin 1 MG CAPS Take 1 mg by mouth daily.    carvedilol (COREG) 3.125 MG tablet Take 1 tablet (3.125 mg total) by mouth 2 (two) times daily with a meal. Hold if SBP<110 or DBP<60   cetirizine  (ZYRTEC) 10 MG tablet Take 10 mg by mouth every morning.   Cholecalciferol (VITAMIN D3) 125 MCG (5000 UT) TABS Take 1 tablet (5,000 Units total) by mouth daily.   citalopram (CELEXA) 10 MG tablet Take 10 mg by mouth daily.   Cranberry 450 MG CAPS Take 1 capsule by mouth daily.   dapagliflozin propanediol (FARXIGA) 10 MG TABS tablet Take 1 tablet (10 mg total) by mouth daily before breakfast.   folic acid (FOLVITE) A999333 MCG tablet Take 1 tablet (400 mcg total) by mouth daily. SEPARATE ALL SUPPLEMENTS TO LUNCH OR DINNER AND PRILOSEC NOT TO MESS W/THYROID MED   levothyroxine (SYNTHROID) 88 MCG tablet Take 1 tablet (88 mcg total) by mouth daily before breakfast. Skip sundays   lidocaine 4 % Place 1 patch onto the skin daily. Remove & Discard patch within 12 hours or as directed by MD   losartan (COZAAR) 25 MG tablet Take 0.5 tablets (12.5 mg total) by mouth daily. Hold for Systolic Blood pressure less than 110.   magnesium oxide (MAG-OX) 400 (240 Mg) MG tablet Take 1 tablet (400 mg total) by mouth daily.   melatonin 5 MG TABS Take 1 tablet (5 mg total) by mouth at bedtime.   montelukast (SINGULAIR) 10 MG tablet Take 1 tablet (10 mg total) by mouth at bedtime.   Multiple Vitamin (MULTIVITAMIN WITH MINERALS) TABS tablet Take 1 tablet by mouth daily.   omeprazole (  PRILOSEC) 40 MG capsule Take 1 capsule (40 mg total) by mouth daily. After lunch   polyethylene glycol powder (GLYCOLAX/MIRALAX) 17 GM/SCOOP powder Take 17 g by mouth daily as needed for moderate constipation.   potassium chloride SA (KLOR-CON M) 20 MEQ tablet Take 1 tablet (20 mEq total) by mouth 2 (two) times daily.   pregabalin (LYRICA) 75 MG capsule Take 75 mg by mouth 3 (three) times daily.   torsemide (DEMADEX) 20 MG tablet 40mg  on M, W,F,Sat and 20mg  on T,TH, Sun   traMADol (ULTRAM) 50 MG tablet Take 50 mg by mouth every 6 (six) hours as needed.   traZODone (DESYREL) 100 MG tablet Take by mouth.   vitamin B-12 (CYANOCOBALAMIN) 1000 MCG  tablet Take 1 tablet (1,000 mcg total) by mouth daily.    Allergies: Patient has no known allergies.  Social History   Tobacco Use   Smoking status: Never    Passive exposure: Yes   Smokeless tobacco: Never   Tobacco comments:    husbands and children smoked in home.   Vaping Use   Vaping Use: Never used  Substance Use Topics   Alcohol use: No   Drug use: No    Family History  Problem Relation Age of Onset   Heart disease Mother    Hypertension Mother    Diabetes Mother    Heart attack Mother 61   Heart disease Father    Heart attack Father 86   Breast cancer Maternal Aunt    Heart attack Brother     Review of Systems: A 12-system review of systems was performed and was negative except as noted in the HPI.  --------------------------------------------------------------------------------------------------  Physical Exam: BP 118/70 (BP Location: Left Arm, Patient Position: Sitting, Cuff Size: Large)   Pulse 97   Resp (!) 21   Ht 5' 4.5" (1.638 m)   Wt 176 lb 12.8 oz (80.2 kg)   SpO2 96%   BMI 29.88 kg/m   General:  NAD. Neck: JVP ~8 cm with positive HJR. Lungs: Clear to auscultation bilaterally without wheezes or crackles. Heart: Regular rate and rhythm with 1/6 systolic murmur. Abdomen: Soft, nontender, nondistended. Extremities: No lower extremity edema.  1+ pretibial edema bilaterally.  EKG:  NSR with borderline LVH and poor R wave progression (question lead placement).  Compared to prior tracing from 09/11/2021, PRWP is now present.  PAC's are no longer present.  Lab Results  Component Value Date   WBC 3.4 (L) 08/30/2021   HGB 11.6 (L) 08/30/2021   HCT 34.8 (L) 08/30/2021   MCV 93.8 08/30/2021   PLT 118 (L) 08/30/2021    Lab Results  Component Value Date   NA 141 12/20/2021   K 3.4 (L) 12/20/2021   CL 100 12/20/2021   CO2 31 12/20/2021   BUN 16 12/20/2021   CREATININE 1.22 (H) 12/20/2021   GLUCOSE 109 (H) 12/20/2021   ALT 19 03/31/2021     Lab Results  Component Value Date   CHOL 125 11/24/2019   HDL 46.40 11/24/2019   LDLCALC 47 11/24/2019   TRIG 158.0 (H) 11/24/2019   CHOLHDL 3 11/24/2019    --------------------------------------------------------------------------------------------------  ASSESSMENT AND PLAN: Chronic HFrEF due to NICM: Ms. Kopman continues to gain weight and appears at least mildly volume overloaded on exam.  I have encouraged her to limit her fluid intake to 2L/day.  We will increase torsemide to 40 mg daily with repeat BMP on 6/15 when she returns for her urology visit.  I have adjusted  the hold parameters for carvedilol and losartan so that they are held only for symptoms and/or BP < 90/60 in an effort to get her on consistent GDMT to help improve her cardiomyopathy.  If LVEF does not improve with escalation of GDMT, we may need to consider referral to EP to discuss ICD, though her comorbidities make her a suboptimal candidate.  Pericardial effusion: Pericardial effusion noted to be small to moderate in size on follow-up echo.  Continue diuresis, as tolerated.  Nonobstructive CAD: Minimal CAD noted on cath in January.  Continue aspirin and statin.  Follow-up: Return to clinic in 1 month.  Nelva Bush, MD 01/04/2022 9:40 AM

## 2022-01-04 ENCOUNTER — Ambulatory Visit (INDEPENDENT_AMBULATORY_CARE_PROVIDER_SITE_OTHER): Payer: Medicare HMO | Admitting: Internal Medicine

## 2022-01-04 ENCOUNTER — Other Ambulatory Visit: Payer: Self-pay

## 2022-01-04 ENCOUNTER — Encounter: Payer: Self-pay | Admitting: Internal Medicine

## 2022-01-04 VITALS — BP 118/70 | HR 97 | Resp 21 | Ht 64.5 in | Wt 176.8 lb

## 2022-01-04 DIAGNOSIS — I5022 Chronic systolic (congestive) heart failure: Secondary | ICD-10-CM

## 2022-01-04 DIAGNOSIS — I2510 Atherosclerotic heart disease of native coronary artery without angina pectoris: Secondary | ICD-10-CM

## 2022-01-04 DIAGNOSIS — I3139 Other pericardial effusion (noninflammatory): Secondary | ICD-10-CM

## 2022-01-04 DIAGNOSIS — I428 Other cardiomyopathies: Secondary | ICD-10-CM | POA: Diagnosis not present

## 2022-01-04 MED ORDER — LOSARTAN POTASSIUM 25 MG PO TABS: 12.5000 mg | ORAL_TABLET | Freq: Every day | ORAL | 3 refills | Status: DC

## 2022-01-04 MED ORDER — TORSEMIDE 40 MG PO TABS: 40.0000 mg | ORAL_TABLET | Freq: Every day | ORAL | 0 refills | Status: DC

## 2022-01-04 MED ORDER — CARVEDILOL 3.125 MG PO TABS: 3.1250 mg | ORAL_TABLET | Freq: Two times a day (BID) | ORAL | Status: DC

## 2022-01-04 NOTE — Patient Instructions (Signed)
Medication Instructions:   Your physician has recommended you make the following change in your medication:   INCREASE Torsemide 40 mg daily   CHANGE parameters for Losartan 12.5 mg daily - HOLD for blood pressure less than 90/60  CHANGE parameters for Carvedilol 3.125 mg twice daily - HOLD for blood pressure less than 90/60  *If you need a refill on your cardiac medications before your next appointment, please call your pharmacy*   Lab Work:  ON SAME DAY as upcoming urology appointment (01/17/22) - Have labs completed at medical mall at [facility]) - This lab does NOT require fasting  -  Please go to the Medical Mall Entrance at [facility] -  Check in at the Registration Desk: 1st desk to the right, past the screening table -  Valet Parking is offered if needed -  No appointment needed -  Lab hours: Monday- Friday (7:30 am- 5:30 pm)    Testing/Procedures:  None ordered   Follow-Up: At [facility], you and your health needs are our priority.  As part of our continuing mission to provide you with exceptional heart care, we have created designated Provider Care Teams.  These Care Teams include your primary Cardiologist (physician) and Advanced Practice Providers (APPs -  Physician Assistants and Nurse Practitioners) who all work together to provide you with the care you need, when you need it.  We recommend signing up for the patient portal called "MyChart".  Sign up information is provided on this After Visit Summary.  MyChart is used to connect with patients for Virtual Visits (Telemedicine).  Patients are able to view lab/test results, encounter notes, upcoming appointments, etc.  Non-urgent messages can be sent to your provider as well.   To learn more about what you can do with MyChart, go to [email or web address].    Your next appointment:   1 month(s)  The format for your next appointment:   In Person  Provider:   You may see Yvonne Kendall, MD or one of the following  Advanced Practice Providers on your designated Care Team:   Nicolasa Ducking, NP Eula Listen, PA-C Cadence Fransico Michael, PA-C{   Important Information About Sugar

## 2022-01-10 DIAGNOSIS — K5900 Constipation, unspecified: Secondary | ICD-10-CM | POA: Diagnosis not present

## 2022-01-10 DIAGNOSIS — F32A Depression, unspecified: Secondary | ICD-10-CM | POA: Diagnosis not present

## 2022-01-10 DIAGNOSIS — G629 Polyneuropathy, unspecified: Secondary | ICD-10-CM | POA: Diagnosis not present

## 2022-01-10 DIAGNOSIS — M419 Scoliosis, unspecified: Secondary | ICD-10-CM | POA: Diagnosis not present

## 2022-01-10 DIAGNOSIS — G4700 Insomnia, unspecified: Secondary | ICD-10-CM | POA: Diagnosis not present

## 2022-01-10 DIAGNOSIS — M810 Age-related osteoporosis without current pathological fracture: Secondary | ICD-10-CM | POA: Diagnosis not present

## 2022-01-10 DIAGNOSIS — G8929 Other chronic pain: Secondary | ICD-10-CM | POA: Diagnosis not present

## 2022-01-10 DIAGNOSIS — E539 Vitamin B deficiency, unspecified: Secondary | ICD-10-CM | POA: Diagnosis not present

## 2022-01-11 ENCOUNTER — Other Ambulatory Visit: Payer: Self-pay

## 2022-01-11 ENCOUNTER — Emergency Department: Payer: Medicare HMO

## 2022-01-11 DIAGNOSIS — M79662 Pain in left lower leg: Secondary | ICD-10-CM | POA: Diagnosis not present

## 2022-01-11 DIAGNOSIS — I130 Hypertensive heart and chronic kidney disease with heart failure and stage 1 through stage 4 chronic kidney disease, or unspecified chronic kidney disease: Secondary | ICD-10-CM | POA: Insufficient documentation

## 2022-01-11 DIAGNOSIS — Z7984 Long term (current) use of oral hypoglycemic drugs: Secondary | ICD-10-CM | POA: Insufficient documentation

## 2022-01-11 DIAGNOSIS — R059 Cough, unspecified: Secondary | ICD-10-CM | POA: Diagnosis not present

## 2022-01-11 DIAGNOSIS — R0789 Other chest pain: Secondary | ICD-10-CM | POA: Insufficient documentation

## 2022-01-11 DIAGNOSIS — R079 Chest pain, unspecified: Secondary | ICD-10-CM | POA: Diagnosis not present

## 2022-01-11 DIAGNOSIS — E039 Hypothyroidism, unspecified: Secondary | ICD-10-CM | POA: Insufficient documentation

## 2022-01-11 DIAGNOSIS — N182 Chronic kidney disease, stage 2 (mild): Secondary | ICD-10-CM | POA: Diagnosis not present

## 2022-01-11 DIAGNOSIS — M79605 Pain in left leg: Secondary | ICD-10-CM | POA: Insufficient documentation

## 2022-01-11 DIAGNOSIS — Z20822 Contact with and (suspected) exposure to covid-19: Secondary | ICD-10-CM | POA: Insufficient documentation

## 2022-01-11 DIAGNOSIS — M7989 Other specified soft tissue disorders: Secondary | ICD-10-CM | POA: Insufficient documentation

## 2022-01-11 DIAGNOSIS — I2510 Atherosclerotic heart disease of native coronary artery without angina pectoris: Secondary | ICD-10-CM | POA: Diagnosis not present

## 2022-01-11 DIAGNOSIS — R0602 Shortness of breath: Secondary | ICD-10-CM | POA: Diagnosis not present

## 2022-01-11 DIAGNOSIS — E876 Hypokalemia: Secondary | ICD-10-CM | POA: Insufficient documentation

## 2022-01-11 DIAGNOSIS — K449 Diaphragmatic hernia without obstruction or gangrene: Secondary | ICD-10-CM | POA: Diagnosis not present

## 2022-01-11 DIAGNOSIS — Z96652 Presence of left artificial knee joint: Secondary | ICD-10-CM | POA: Diagnosis not present

## 2022-01-11 DIAGNOSIS — M25519 Pain in unspecified shoulder: Secondary | ICD-10-CM | POA: Diagnosis not present

## 2022-01-11 DIAGNOSIS — I959 Hypotension, unspecified: Secondary | ICD-10-CM | POA: Diagnosis not present

## 2022-01-11 DIAGNOSIS — Z79899 Other long term (current) drug therapy: Secondary | ICD-10-CM | POA: Diagnosis not present

## 2022-01-11 DIAGNOSIS — I5022 Chronic systolic (congestive) heart failure: Secondary | ICD-10-CM | POA: Insufficient documentation

## 2022-01-11 DIAGNOSIS — Z7982 Long term (current) use of aspirin: Secondary | ICD-10-CM | POA: Insufficient documentation

## 2022-01-11 LAB — CBC
HCT: 32.9 % — ABNORMAL LOW (ref 36.0–46.0)
Hemoglobin: 11 g/dL — ABNORMAL LOW (ref 12.0–15.0)
MCH: 31.5 pg (ref 26.0–34.0)
MCHC: 33.4 g/dL (ref 30.0–36.0)
MCV: 94.3 fL (ref 80.0–100.0)
Platelets: 159 10*3/uL (ref 150–400)
RBC: 3.49 MIL/uL — ABNORMAL LOW (ref 3.87–5.11)
RDW: 12.2 % (ref 11.5–15.5)
WBC: 5 10*3/uL (ref 4.0–10.5)
nRBC: 0 % (ref 0.0–0.2)

## 2022-01-11 LAB — BASIC METABOLIC PANEL WITH GFR
Anion gap: 5 (ref 5–15)
BUN: 25 mg/dL — ABNORMAL HIGH (ref 8–23)
CO2: 29 mmol/L (ref 22–32)
Calcium: 8.5 mg/dL — ABNORMAL LOW (ref 8.9–10.3)
Chloride: 103 mmol/L (ref 98–111)
Creatinine, Ser: 1.11 mg/dL — ABNORMAL HIGH (ref 0.44–1.00)
GFR, Estimated: 51 mL/min — ABNORMAL LOW
Glucose, Bld: 101 mg/dL — ABNORMAL HIGH (ref 70–99)
Potassium: 3.2 mmol/L — ABNORMAL LOW (ref 3.5–5.1)
Sodium: 137 mmol/L (ref 135–145)

## 2022-01-11 LAB — TROPONIN I (HIGH SENSITIVITY): Troponin I (High Sensitivity): 6 ng/L (ref <18)

## 2022-01-11 NOTE — ED Triage Notes (Signed)
Pt coming from [facility] via EMS. Reports chest pain x 2 hours that radiates to back. Was trying to pull up in bed when it started. Took 324 aspirin. BS: 140  Hx CHF

## 2022-01-12 ENCOUNTER — Emergency Department: Payer: Medicare HMO

## 2022-01-12 ENCOUNTER — Emergency Department
Admission: EM | Admit: 2022-01-12 | Discharge: 2022-01-12 | Disposition: A | Discharge status: Home / Self Care | Payer: Medicare HMO | Attending: Emergency Medicine | Admitting: Emergency Medicine

## 2022-01-12 DIAGNOSIS — E162 Hypoglycemia, unspecified: Secondary | ICD-10-CM | POA: Diagnosis not present

## 2022-01-12 DIAGNOSIS — E161 Other hypoglycemia: Secondary | ICD-10-CM | POA: Diagnosis not present

## 2022-01-12 DIAGNOSIS — M79662 Pain in left lower leg: Secondary | ICD-10-CM | POA: Diagnosis not present

## 2022-01-12 DIAGNOSIS — K449 Diaphragmatic hernia without obstruction or gangrene: Secondary | ICD-10-CM | POA: Diagnosis not present

## 2022-01-12 DIAGNOSIS — R0789 Other chest pain: Secondary | ICD-10-CM

## 2022-01-12 DIAGNOSIS — Z743 Need for continuous supervision: Secondary | ICD-10-CM | POA: Diagnosis not present

## 2022-01-12 LAB — BRAIN NATRIURETIC PEPTIDE: B Natriuretic Peptide: 21.1 pg/mL (ref 0.0–100.0)

## 2022-01-12 LAB — D-DIMER, QUANTITATIVE: D-Dimer, Quant: 1.32 ug/mL-FEU — ABNORMAL HIGH (ref 0.00–0.50)

## 2022-01-12 LAB — TROPONIN I (HIGH SENSITIVITY): Troponin I (High Sensitivity): 6 ng/L (ref <18)

## 2022-01-12 LAB — MAGNESIUM: Magnesium: 2.2 mg/dL (ref 1.7–2.4)

## 2022-01-12 LAB — SARS CORONAVIRUS 2 BY RT PCR: SARS Coronavirus 2 by RT PCR: NEGATIVE

## 2022-01-12 MED ORDER — ONDANSETRON HCL 4 MG/2ML IJ SOLN
4.0000 mg | Freq: Once | INTRAMUSCULAR | Status: AC
  Administered 2022-01-12: 4 mg via INTRAVENOUS
  Filled 2022-01-12: qty 2

## 2022-01-12 MED ORDER — IOHEXOL 350 MG/ML SOLN
75.0000 mL | Freq: Once | INTRAVENOUS | Status: AC | PRN
  Administered 2022-01-12: 75 mL via INTRAVENOUS

## 2022-01-12 MED ORDER — FENTANYL CITRATE PF 50 MCG/ML IJ SOSY
50.0000 ug | PREFILLED_SYRINGE | Freq: Once | INTRAMUSCULAR | Status: AC
  Administered 2022-01-12: 50 ug via INTRAVENOUS
  Filled 2022-01-12: qty 1

## 2022-01-12 MED ORDER — POTASSIUM CHLORIDE CRYS ER 20 MEQ PO TBCR
40.0000 meq | EXTENDED_RELEASE_TABLET | Freq: Once | ORAL | Status: AC
Start: 2022-01-12 — End: 2022-01-12
  Administered 2022-01-12: 40 meq via ORAL
  Filled 2022-01-12: qty 2

## 2022-01-12 MED ORDER — ACETAMINOPHEN 500 MG PO TABS
1000.0000 mg | ORAL_TABLET | Freq: Once | ORAL | Status: AC
  Administered 2022-01-12: 1000 mg via ORAL
  Filled 2022-01-12: qty 2

## 2022-01-12 NOTE — ED Provider Notes (Addendum)
[facility] Provider Note    Event Date/Time   First MD Initiated Contact with Patient 01/12/22 0206     (approximate)   History   Chest Pain   HPI  Kimberly Cook is a 76 y.o. female with history of CHF, CAD, hypertension, hyperlipidemia, hypothyroidism who presents to the emergency department with complaints of chest pain that started at 8:30 PM tonight.  States initially started off as a left-sided sharp pain without radiation and now is a pressure.  Has had some shortness of breath, nonproductive cough for the past few days.  No nausea, vomiting, fevers, diaphoresis, dizziness.  States she has had some swelling in both of her legs and has had increased pain in the left calf compared to the right which she states is progressively worsened since she had knee replacement 7 years ago.  Denies any known history of PE or DVT.  States she did start having chest pain after she used her left arm to pull herself up in the bed.  Pain is worse with movement and palpation of the chest wall.   History provided by patient.    Past Medical History:  Diagnosis Date   Allergy    Anxiety    Arthritis    Back pain    Chronic HFrEF (heart failure with reduced ejection fraction) ([place])    a. 08/2021 echo: EF 30-35%, glob HK, GrI DD; b. 10/2021 Echo: EF 30-35%.   CKD (chronic kidney disease), stage II    Coronary artery disease    a. Mild to moderate CAD in LAD/diagonal by CTA (CT-FFR of apical LAD 0.79); b. 08/2021 Cath: LM nl, LAD min irregs, D1/2/3 nl, LCX nl, OM2/3 nl, RCA 20p, RPDA mild dzs, RPAV nl.   DDD (degenerative disc disease), lumbar    Depression    Diverticulosis    Essential hypertension    GERD (gastroesophageal reflux disease)    Headache    History of shingles    Hyperlipidemia    Hypothyroidism    Lung nodule    Mini stroke 2011   Moderate Pericardial effusion    a. 08/2021 Echo: moderate circumferential pericardial effusion w/o tamponade; b.  10/2021 Echo: EF 30-35%, small to mod circumferential pericardial effusion w/o tamponade.   NICM (nonischemic cardiomyopathy) ([place])    a. 08/2021 Echo: EF 30-35%, glob HK, GrI DD, nl RV fxn, mild-mod dil LA, mod circumferential pericardial eff w/o tamponade, Mod MR; b. 10/2021 Echo: EF 30-35%, glob HK.   Occipital neuralgia    Palpitations    Pleural effusion on left    a. 08/2021 s/p thoracentesis.   Pneumonia 2018   Prediabetes    Stroke [facility])    Stroke ([place])    MRI 04/2008 + left sup. frontal gyrus possibly puntate infarct    Syncope 2019   Urinary tract infection    Vitamin D deficiency     Past Surgical History:  Procedure Laterality Date   ABDOMINAL HYSTERECTOMY     BLADDER SURGERY     2003   BREAST EXCISIONAL BIOPSY Right Over 20 years    Benign   CHOLECYSTECTOMY     gastroplication      JOINT REPLACEMENT Left    KNEE   KNEE ARTHROSCOPY Left 2011   PULSE GENERATOR IMPLANT Left 01/31/2020   Procedure: PLACEMENT RIGHT FLANK PULSE GENERATOR VS REMOVAL SPINAL CORD STIMULATOR;  Surgeon: Deetta Perla, MD;  Location: [facility] ORS;  Service: Neurosurgery;  Laterality: Left;   PULSE GENERATOR  IMPLANT Left 04/24/2020   Procedure: REPLACEMENT LEFT FLANK PULSE GENERATOR IMPLANT;  Surgeon: Deetta Perla, MD;  Location: [facility] OR[facility];  Service: Neurosurgery;  Laterality: Left;  MAC w/ local   right arm fracture     RIGHT/LEFT HEART CATH AND CORONARY ANGIOGRAPHY N/A 08/28/2021   Procedure: RIGHT/LEFT HEART CATH AND CORONARY ANGIOGRAPHY;  Surgeon: Wellington Hampshire, MD;  Location: [facility];  Service: Cardiovascular;  Laterality: N/A;   SPINAL CORD STIMULATOR REMOVAL N/A 06/26/2020   Procedure: SPINAL CORD STIMULATOR REMOVAL;  Surgeon: Deetta Perla, MD;  Location: [facility] OR[facility];  Service: Neurosurgery;  Laterality: N/A;   THORACIC LAMINECTOMY FOR SPINAL CORD STIMULATOR N/A 01/24/2020   Procedure: THORACIC SPINAL CORD STIMULATOR PADDLE TRIAL VIA LAMINECTOMY;  Surgeon: Deetta Perla, MD;  Location:  [facility] OR[facility];  Service: Neurosurgery;  Laterality: N/A;   TONSILLECTOMY AND ADENOIDECTOMY     TOTAL KNEE ARTHROPLASTY Left 04/17/2015   Procedure: LEFT TOTAL KNEE ARTHROPLASTY;  Surgeon: Paralee Cancel, MD;  Location: [facility];  Service: Orthopedics;  Laterality: Left;    MEDICATIONS:  Prior to Admission medications   Medication Sig Start Date End Date Taking? Authorizing Provider  acetaminophen (TYLENOL) 500 MG tablet Take 1,000 mg by mouth every 8 (eight) hours as needed for moderate pain.    [provider]  alendronate (FOSAMAX) 70 MG tablet Take 70 mg by mouth once a week.  10/23/19   [provider]  ascorbic acid (VITAMIN C) 500 MG tablet Take 500 mg by mouth daily.    [provider]  aspirin EC 81 MG tablet Take 1 tablet (81 mg total) by mouth at bedtime. Swallow whole. 10/25/20   McLean-Scocuzza, Nino Glow, MD  atorvastatin (LIPITOR) 20 MG tablet Take 1 tablet (20 mg total) by mouth daily. 06/20/20   McLean-Scocuzza, Nino Glow, MD  Biotin 1 MG CAPS Take 1 mg by mouth daily.     [provider]  carvedilol (COREG) 3.125 MG tablet Take 1 tablet (3.125 mg total) by mouth 2 (two) times daily with a meal. Hold for blood pressure less than 90/60 01/04/22   End, Harrell Gave, MD  cetirizine (ZYRTEC) 10 MG tablet Take 10 mg by mouth every morning.    [provider]  Cholecalciferol (VITAMIN D3) 125 MCG (5000 UT) TABS Take 1 tablet (5,000 Units total) by mouth daily. 10/09/18   McLean-Scocuzza, Nino Glow, MD  citalopram (CELEXA) 10 MG tablet Take 10 mg by mouth daily.    [provider]  Cranberry 450 MG CAPS Take 1 capsule by mouth daily.    [provider]  dapagliflozin propanediol (FARXIGA) 10 MG TABS tablet Take 1 tablet (10 mg total) by mouth daily before breakfast. 09/11/21   Theora Gianotti, NP  folic acid (FOLVITE) 774 MCG tablet Take 1 tablet (400 mcg total) by mouth daily. SEPARATE ALL SUPPLEMENTS TO LUNCH OR DINNER AND PRILOSEC NOT  TO MESS W/THYROID MED 12/10/18   McLean-Scocuzza, Nino Glow, MD  levothyroxine (SYNTHROID) 88 MCG tablet Take 1 tablet (88 mcg total) by mouth daily before breakfast. Skip sundays 12/02/19   McLean-Scocuzza, Nino Glow, MD  lidocaine 4 % Place 1 patch onto the skin daily. Remove & Discard patch within 12 hours or as directed by MD    [provider]  losartan (COZAAR) 25 MG tablet Take 0.5 tablets (12.5 mg total) by mouth daily. Hold for Blood pressure less than 90/60 01/04/22   End, Harrell Gave, MD  magnesium oxide (MAG-OX) 400 (240 Mg) MG tablet Take  1 tablet (400 mg total) by mouth daily. 08/31/21   Nicole Kindred A, DO  melatonin 5 MG TABS Take 1 tablet (5 mg total) by mouth at bedtime. 04/04/21   Val Riles, MD  montelukast (SINGULAIR) 10 MG tablet Take 1 tablet (10 mg total) by mouth at bedtime. 02/14/20   McLean-Scocuzza, Nino Glow, MD  Multiple Vitamin (MULTIVITAMIN WITH MINERALS) TABS tablet Take 1 tablet by mouth daily.    [provider]  omeprazole (PRILOSEC) 40 MG capsule Take 1 capsule (40 mg total) by mouth daily. After lunch 06/23/20   McLean-Scocuzza, Nino Glow, MD  polyethylene glycol powder (GLYCOLAX/MIRALAX) 17 GM/SCOOP powder Take 17 g by mouth daily as needed for moderate constipation. 10/21/19   McLean-Scocuzza, Nino Glow, MD  potassium chloride SA (KLOR-CON M) 20 MEQ tablet Take 1 tablet (20 mEq total) by mouth 2 (two) times daily. 08/30/21   Ezekiel Slocumb, DO  pregabalin (LYRICA) 75 MG capsule Take 75 mg by mouth 3 (three) times daily.    [provider]  torsemide 40 MG TABS Take 40 mg by mouth daily. 01/04/22 02/03/22  End, Harrell Gave, MD  traMADol (ULTRAM) 50 MG tablet Take 50 mg by mouth every 6 (six) hours as needed.    [provider]  traZODone (DESYREL) 100 MG tablet Take by mouth. 12/13/21   [provider]  vitamin B-12 (CYANOCOBALAMIN) 1000 MCG tablet Take 1 tablet (1,000 mcg total) by mouth daily. 10/09/18   McLean-Scocuzza, Nino Glow, MD     Physical Exam   Triage Vital Signs: ED Triage Vitals  Enc Vitals Group     BP 01/11/22 2238 105/61     Pulse Rate 01/11/22 2238 68     Resp 01/11/22 2238 17     Temp 01/11/22 2246 98.6 F (37 C)     Temp Source 01/11/22 2246 Oral     SpO2 01/11/22 2238 97 %     Weight 01/11/22 2236 174 lb (78.9 kg)     Height 01/11/22 2236 _0  (1.651 m)     Head Circumference --      Peak Flow --      Pain Score 01/11/22 2236 7     Pain Loc --      Pain Edu? --      Excl. in [place]? --     Most recent vital signs: Vitals:   01/12/22 0745 01/12/22 0751  BP: 91/65 115/81  Pulse: 63 (!) 56  Resp: 20 13  Temp:    SpO2: 94% 98%    CONSTITUTIONAL: Alert and oriented and responds appropriately to questions. Well-appearing; well-nourished elderly, in no distress HEAD: Normocephalic, atraumatic EYES: Conjunctivae clear, pupils appear equal, sclera nonicteric ENT: normal nose; moist mucous membranes NECK: Supple, normal ROM CARD: RRR; S1 and S2 appreciated; no murmurs, no clicks, no rubs, no gallops CHEST:  Chest wall is tender to palpation over the left anterior chest wall.  No crepitus, ecchymosis, erythema, warmth, rash or other lesions present.   RESP: Normal chest excursion without splinting or tachypnea; breath sounds clear and equal bilaterally; no wheezes, no rhonchi, no rales, no hypoxia or respiratory distress, speaking full sentences ABD/GI: Normal bowel sounds; non-distended; soft, non-tender, no rebound, no guarding, no peritoneal signs BACK: The back appears normal EXT: Normal ROM in all joints; no deformity noted, no edema; no cyanosis, patient has left-sided calf tenderness on exam but no asymmetry, extremities warm well-perfused, compartments soft, no redness noted, no joint effusion, no bony deformity SKIN:  Normal color for age and race; warm; no rash on exposed skin NEURO: Moves all extremities equally, normal speech PSYCH: The patient's mood and manner are  appropriate.   ED Results / Procedures / Treatments   LABS: (all labs ordered are listed, but only abnormal results are displayed) Labs Reviewed  BASIC METABOLIC PANEL - Abnormal; Notable for the following components:      Result Value   Potassium 3.2 (*)    Glucose, Bld 101 (*)    BUN 25 (*)    Creatinine, Ser 1.11 (*)    Calcium 8.5 (*)    GFR, Estimated 51 (*)    All other components within normal limits  CBC - Abnormal; Notable for the following components:   RBC 3.49 (*)    Hemoglobin 11.0 (*)    HCT 32.9 (*)    All other components within normal limits  D-DIMER, QUANTITATIVE - Abnormal; Notable for the following components:   D-Dimer, Quant 1.32 (*)    All other components within normal limits  SARS CORONAVIRUS 2 BY RT PCR  BRAIN NATRIURETIC PEPTIDE  MAGNESIUM  TROPONIN I (HIGH SENSITIVITY)  TROPONIN I (HIGH SENSITIVITY)     EKG:  EKG Interpretation  Date/Time:  Saturday January 12 2022 01:58:21 EDT Ventricular Rate:  68 PR Interval:  171 QRS Duration: 99 QT Interval:  490 QTC Calculation: 522 R Axis:   -13 Text Interpretation: Sinus rhythm Low voltage, precordial leads Abnormal R-wave progression, early transition Borderline T abnormalities, anterior leads Prolonged QT interval Confirmed by Pryor Curia [phone]) on 01/12/2022 2:30:39 AM          EKG Interpretation  Date/Time:  Saturday January 12 2022 02:34:35 EDT Ventricular Rate:  64 PR Interval:  183 QRS Duration: 100 QT Interval:  501 QTC Calculation: 517 R Axis:   -12 Text Interpretation: Sinus rhythm Ventricular premature complex Low voltage, precordial leads Borderline T abnormalities, anterior leads Prolonged QT interval Confirmed by Pryor Curia [phone]) on 01/12/2022 3:42:50 AM         EKG Interpretation  Date/Time:  Saturday January 12 2022 04:31:39 EDT Ventricular Rate:  67 PR Interval:  174 QRS Duration: 100 QT Interval:  478 QTC Calculation: 505 R Axis:   -9 Text  Interpretation: Sinus rhythm Low voltage, precordial leads Borderline T abnormalities, anterior leads Prolonged QT interval Confirmed by Pryor Curia [phone]) on 01/12/2022 7:51:03 AM         RADIOLOGY: My personal review and interpretation of imaging: CTA shows no PE.  Left lower extremity ultrasound shows no DVT.  Chest x-ray clear.  I have personally reviewed all radiology reports.   CT Angio Chest PE W and/or Wo Contrast  Result Date: 01/12/2022 CLINICAL DATA:  Positive D-dimer with pulmonary embolism suspected EXAM: CT ANGIOGRAPHY CHEST WITH CONTRAST TECHNIQUE: Multidetector CT imaging of the chest was performed using the standard protocol during bolus administration of intravenous contrast. Multiplanar CT image reconstructions and MIPs were obtained to evaluate the vascular anatomy. RADIATION DOSE REDUCTION: This exam was performed according to the departmental dose-optimization program which includes automated exposure control, adjustment of the mA and/or kV according to patient size and/or use of iterative reconstruction technique. CONTRAST:  42m OMNIPAQUE IOHEXOL 350 MG/ML SOLN COMPARISON:  None Available. FINDINGS: Cardiovascular: Satisfactory opacification of the pulmonary arteries to the segmental level. No evidence of pulmonary embolism. Normal heart size. Trace pericardial fluid and/or thickening.  Coronary atherosclerosis Mediastinum/Nodes: Moderate sliding hiatal hernia affecting the postoperative stomach, appearance stable from 2021 thoracic spine CT.  No adenopathy or visible inflammation Lungs/Pleura: Central airways are clear. There is no edema, consolidation, effusion, or pneumothorax. Upper Abdomen: Postoperative stomach and gallbladder. Borderline splenomegaly with 5.2 cm thickness. Musculoskeletal: T4 to lumbar fusion, seen to the level of C58 on CT slices. No evidence of hardware or bony fracture. The open upper thoracic disc spaces are narrowed with endplate degeneration. Review  of the MIP images confirms the above findings. IMPRESSION: 1. Negative for pulmonary embolism or other acute finding. 2. Chronic hiatal hernia affecting the postoperative stomach. 3. Atherosclerosis of the coronary arteries. Electronically Signed   By: Jorje Guild M.D.   On: 01/12/2022 04:36   US Venous Img Lower Unilateral Left  Result Date: 01/12/2022 CLINICAL DATA:  Left lower extremity pain. EXAM: Left LOWER EXTREMITY VENOUS DOPPLER ULTRASOUND TECHNIQUE: Gray-scale sonography with compression, as well as color and duplex ultrasound, were performed to evaluate the deep venous system(s) from the level of the common femoral vein through the popliteal and proximal calf veins. COMPARISON:  None Available. FINDINGS: VENOUS Normal compressibility of the common femoral, superficial femoral, and popliteal veins, as well as the visualized calf veins. Visualized portions of profunda femoral vein and great saphenous vein unremarkable. No filling defects to suggest DVT on grayscale or color Doppler imaging. Doppler waveforms show normal direction of venous flow, normal respiratory plasticity and response to augmentation. Limited views of the contralateral common femoral vein are unremarkable. OTHER None. Limitations: none IMPRESSION: Negative. Electronically Signed   By: Anner Crete M.D.   On: 01/12/2022 03:30   DG Chest 2 View  Result Date: 01/11/2022 CLINICAL DATA:  Chest pain. EXAM: CHEST - 2 VIEW COMPARISON:  Chest radiograph dated 08/27/2021. FINDINGS: No focal consolidation, pleural effusion, pneumothorax. The cardiac silhouette is within limits. No acute osseous pathology. Extensive spinal fusion. IMPRESSION: No active cardiopulmonary disease. Electronically Signed   By: Anner Crete M.D.   On: 01/11/2022 23:15     PROCEDURES:  Critical Care performed: No      .1-3 Lead EKG Interpretation  Performed by: Jennene Downie, Delice Bison, DO Authorized by: Royer Cristobal, Delice Bison, DO     Interpretation: normal      ECG rate:  66   ECG rate assessment: normal     Rhythm: sinus rhythm     Ectopy: none     Conduction: normal       IMPRESSION / MDM / ASSESSMENT AND PLAN / ED COURSE  I reviewed the triage vital signs and the nursing notes.    Patient here with complaints of left-sided chest pain that is worse with movement and palpation.  Started after pulling herself upright in bed at her nursing facility and also has had an increased cough over the past few days.  Also complaining of increase pain in the left calf.  The patient is on the cardiac monitor to evaluate for evidence of arrhythmia and/or significant heart rate changes.   DIFFERENTIAL DIAGNOSIS (includes but not limited to):   Differential includes chest wall pain, ACS, CHF, pneumonia, pneumothorax, viral URI, PE, DVT   Patient's presentation is most consistent with acute presentation with potential threat to life or bodily function.   PLAN: We will obtain CBC, BMP, troponin x2, BNP, D-dimer.  EKG shows no new ischemic change.  Will obtain chest x-ray.  Will give fentanyl for pain.   MEDICATIONS GIVEN IN ED: Medications  fentaNYL (SUBLIMAZE) injection 50 mcg (50 mcg Intravenous Given 01/12/22 0222)  ondansetron (ZOFRAN) injection 4 mg (4 mg Intravenous Given 01/12/22  0222)  potassium chloride SA (KLOR-CON M) CR tablet 40 mEq (40 mEq Oral Given 01/12/22 0318)  iohexol (OMNIPAQUE) 350 MG/ML injection 75 mL (75 mLs Intravenous Contrast Given 01/12/22 0340)  acetaminophen (TYLENOL) tablet 1,000 mg (1,000 mg Oral Given 01/12/22 0551)     ED COURSE: Patient's labs show no leukocytosis.  Stable hemoglobin.  Troponin x2 negative.  Potassium level slightly low at 3.2.  Given oral replacement.  Magnesium level normal.  BNP normal.  COVID-negative.  Chest x-ray reviewed/interpreted by myself and radiology shows no infiltrate, edema or pneumothorax.  D-dimer elevated.  Proceeded with CTA of the chest which was reviewed/interpreted by myself and  radiologist and showed no PE.  Suspect musculoskeletal chest pain.  Initial EKG did show some QT prolongation which has improved.  I feel she is safe for discharge back to her nursing facility.  Recommended Tylenol for pain control.   At this time, I do not feel there is any life-threatening condition present. I reviewed all nursing notes, vitals, pertinent previous records.  All lab and urine results, EKGs, imaging ordered have been independently reviewed and interpreted by myself.  I reviewed all available radiology reports from any imaging ordered this visit.  Based on my assessment, I feel the patient is safe to be discharged home without further emergent workup and can continue workup as an outpatient as needed. Discussed all findings, treatment plan as well as usual and customary return precautions with patient.  They verbalize understanding and are comfortable with this plan.  Outpatient follow-up has been provided as needed.  All questions have been answered.   7:55 AM  Pt's nurse informing that she has had several blood pressures with systolics in the 75Z.  Maps still greater than 65 and heart rate is normal.  Patient states it is not abnormal for her blood pressure to be in the 90s intermittently and that often her blood pressure medications have to be held at the nursing home.  It looks like she is on low-dose carvedilol twice daily and torsemide daily.  She does not appear volume depleted.  She is afebrile.  Repeat oral temp is 98.  She denies fevers, cough, vomiting, diarrhea, bloody stools or melena.  I readjusted her blood pressure cuff and checked it again and is now 115/81.  I recommended that she talk to her primary care doctor about whether or not they should decrease any of her medications further.  She is not having any other new complaints today.  Chest pain has been worked up here in the emergency department and again I suspect musculoskeletal source.  Do not think that this is  cardiogenic shock.  I feels that she is still safe to be discharged back to [facility]   CONSULTS: Admission considered given multiple risk factors for ACS and CHF however chest pain seems atypical, likely musculoskeletal in nature and she has had a negative cardiac work-up here and has remained hemodynamically stable.   OUTSIDE RECORDS REVIEWED:  08/28/2021 [facility]:    Prox RCA lesion is 20% stenosed.   1.  Mild nonobstructive coronary artery disease. 2.  Left ventricular angiography was not performed.  EF was 30 to 35% by echo. 3.  Right heart catheterization showed mildly elevated right and left filling pressures, mild pulmonary hypertension and mildly reduced cardiac output.   Recommendations: Recommend medical therapy for nonischemic cardiomyopathy. The patient continues to be mildly volume overloaded.  We will continue IV furosemide tonight and switch to oral furosemide tomorrow  morning.         FINAL CLINICAL IMPRESSION(S) / ED DIAGNOSES   Final diagnoses:  Chest wall pain     Rx / DC Orders   ED Discharge Orders     None        Note:  This document was prepared using Dragon voice recognition software and may include unintentional dictation errors.   Pasha Gadison, Delice Bison, DO 01/12/22 [place], Delice Bison, DO 01/12/22 [phone]

## 2022-01-12 NOTE — Discharge Instructions (Signed)
You may take Tylenol 1000 mg every 6 hours as needed for pain. °

## 2022-01-12 NOTE — ED Notes (Signed)
RN to bedside to introduce self to pt. Pt is CAOx4. Pt in no acute distress.

## 2022-01-12 NOTE — ED Notes (Signed)
US at bedside

## 2022-01-12 NOTE — ED Notes (Signed)
Pt is hypotensive. MD made aware as she has been DC awaiting EMS transport back to her facility.

## 2022-01-12 NOTE — ED Notes (Signed)
[facility] to transport pt to [facility] 

## 2022-01-12 NOTE — ED Notes (Signed)
RN attempted to call report to [facility]. No response.

## 2022-01-12 NOTE — ED Notes (Signed)
Rn attempted to call report to [facility]. No answer.

## 2022-01-17 ENCOUNTER — Ambulatory Visit (INDEPENDENT_AMBULATORY_CARE_PROVIDER_SITE_OTHER): Payer: Medicare HMO | Admitting: Physician Assistant

## 2022-01-17 ENCOUNTER — Other Ambulatory Visit
Admission: RE | Admit: 2022-01-17 | Discharge: 2022-01-17 | Disposition: A | Discharge status: Home / Self Care | Payer: Medicare HMO | Attending: Internal Medicine | Admitting: Internal Medicine

## 2022-01-17 ENCOUNTER — Encounter: Payer: Self-pay | Admitting: Physician Assistant

## 2022-01-17 VITALS — BP 90/53 | HR 66 | Ht 63.0 in

## 2022-01-17 DIAGNOSIS — I5022 Chronic systolic (congestive) heart failure: Secondary | ICD-10-CM | POA: Diagnosis not present

## 2022-01-17 DIAGNOSIS — N3281 Overactive bladder: Secondary | ICD-10-CM | POA: Diagnosis not present

## 2022-01-17 DIAGNOSIS — R339 Retention of urine, unspecified: Secondary | ICD-10-CM

## 2022-01-17 DIAGNOSIS — I2510 Atherosclerotic heart disease of native coronary artery without angina pectoris: Secondary | ICD-10-CM | POA: Diagnosis not present

## 2022-01-17 LAB — BASIC METABOLIC PANEL
Anion gap: 9 (ref 5–15)
BUN: 25 mg/dL — ABNORMAL HIGH (ref 8–23)
CO2: 30 mmol/L (ref 22–32)
Calcium: 9.4 mg/dL (ref 8.9–10.3)
Chloride: 101 mmol/L (ref 98–111)
Creatinine, Ser: 1.01 mg/dL — ABNORMAL HIGH (ref 0.44–1.00)
GFR, Estimated: 57 mL/min — ABNORMAL LOW (ref >60)
Glucose, Bld: 98 mg/dL (ref 70–99)
Potassium: 3.5 mmol/L (ref 3.5–5.1)
Sodium: 140 mmol/L (ref 135–145)

## 2022-01-17 LAB — BLADDER SCAN AMB NON-IMAGING

## 2022-01-17 NOTE — Progress Notes (Signed)
01/17/2022 10:45 AM   Kimberly Cook [birth year] [id number]  CC: Chief Complaint  Patient presents with   Urinary Retention   HPI: Kimberly Cook is a 76 y.o. female with PMH recurrent UTI versus asymptomatic bacteriuria, vaginal atrophy, and OAB who developed urinary retention on Gemtesa who presents today for repeat PVR after passing a voiding trial off of beta 3 agonists last month.   Since I last saw her, a Foley catheter was inappropriately replaced at [facility] and has since been removed.  She has been struggling with volume overload and her torsemide has been increased. Cardiology has recommended a fluid restricted diet of <2L daily.  Today she reports concerns regarding possible Foley catheter replacement, which she thinks may be necessary because she has had problems with increased weight and thinks this could represent urinary retention.  She continues to experience primarily urinary urgency with some urge incontinence, though she is less bothered by the leakage.  She has issues with orthostasis and dry mouth at baseline.  PVR 40m.  PMH: Past Medical History:  Diagnosis Date   Allergy    Anxiety    Arthritis    Back pain    Chronic HFrEF (heart failure with reduced ejection fraction) ([place]    a. 08/2021 echo: EF 30-35%, glob HK, GrI DD; b. 10/2021 Echo: EF 30-35%.   CKD (chronic kidney disease), stage II    Coronary artery disease    a. Mild to moderate CAD in LAD/diagonal by CTA (CT-FFR of apical LAD 0.79); b. 08/2021 Cath: LM nl, LAD min irregs, D1/2/3 nl, LCX nl, OM2/3 nl, RCA 20p, RPDA mild dzs, RPAV nl.   DDD (degenerative disc disease), lumbar    Depression    Diverticulosis    Essential hypertension    GERD (gastroesophageal reflux disease)    Headache    History of shingles    Hyperlipidemia    Hypothyroidism    Lung nodule    Mini stroke 2011   Moderate Pericardial effusion    a. 08/2021 Echo: moderate circumferential pericardial effusion w/o  tamponade; b. 10/2021 Echo: EF 30-35%, small to mod circumferential pericardial effusion w/o tamponade.   NIC[place] (nonischemic cardiomyopathy) ([place]    a. 08/2021 Echo: EF 30-35%, glob HK, GrI DD, nl RV fxn, mild-mod dil LA, mod circumferential pericardial eff w/o tamponade, Mod MR; b. 10/2021 Echo: EF 30-35%, glob HK.   Occipital neuralgia    Palpitations    Pleural effusion on left    a. 08/2021 s/p thoracentesis.   Pneumonia 2018   Prediabetes    [facility] ([facility]    [place]    MRI 04/2008 + left sup. frontal gyrus possibly puntate infarct    Syncope 2019   Urinary tract infection    Vitamin D deficiency     Surgical History: Past Surgical History:  Procedure Laterality Date   ABDOMINAL HYSTERECTOMY     BLADDER SURGERY     2003   BREAST EXCISIONAL BIOPSY Right Over 20 years    Benign   CHOLECYSTECTOMY     gastroplication      JOINT REPLACEMENT Left    KNEE   KNEE ARTHROSCOPY Left 2011   PULSE GENERATOR IMPLANT Left 01/31/2020   Procedure: PLACEMENT RIGHT FLANK PULSE GENERATOR VS REMOVAL SPINAL CORD STIMULATOR;  Surgeon: CDeetta Perla MD;  Location: [facility] ORS;  Service: Neurosurgery;  Laterality: Left;   PULSE GENERATOR IMPLANT Left 04/24/2020   Procedure: REPLACEMENT LEFT FLANK PULSE GENERATOR IMPLANT;  Surgeon: CDeetta Perla  MD;  Location: [facility] ORS;  Service: Neurosurgery;  Laterality: Left;  MAC w/ local   right arm fracture     RIGHT/LEFT HEART CATH AND CORONARY ANGIOGRAPHY N/A 08/28/2021   Procedure: RIGHT/LEFT HEART CATH AND CORONARY ANGIOGRAPHY;  Surgeon: Wellington Hampshire, MD;  Location: [facility];  Service: Cardiovascular;  Laterality: N/A;   SPINAL CORD STIMULATOR REMOVAL N/A 06/26/2020   Procedure: SPINAL CORD STIMULATOR REMOVAL;  Surgeon: Deetta Perla, MD;  Location: [facility] ORS;  Service: Neurosurgery;  Laterality: N/A;   THORACIC LAMINECTOMY FOR SPINAL CORD STIMULATOR N/A 01/24/2020   Procedure: THORACIC SPINAL CORD STIMULATOR PADDLE TRIAL VIA LAMINECTOMY;  Surgeon:  Deetta Perla, MD;  Location: [facility] OR[facility];  Service: Neurosurgery;  Laterality: N/A;   TONSILLECTOMY AND ADENOIDECTOMY     TOTAL KNEE ARTHROPLASTY Left 04/17/2015   Procedure: LEFT TOTAL KNEE ARTHROPLASTY;  Surgeon: Paralee Cancel, MD;  Location: [facility];  Service: Orthopedics;  Laterality: Left;    Home Medications:  Allergies as of 01/17/2022   No Known Allergies      Medication List        Accurate as of January 17, 2022 10:45 AM. If you have any questions, ask your nurse or doctor.          acetaminophen 500 MG tablet Commonly known as: TYLENOL Take 1,000 mg by mouth every 8 (eight) hours as needed for moderate pain.   alendronate 70 MG tablet Commonly known as: FOSAMAX Take 70 mg by mouth once a week.   ascorbic acid 500 MG tablet Commonly known as: VITAMIN C Take 500 mg by mouth daily.   aspirin EC 81 MG tablet Take 1 tablet (81 mg total) by mouth at bedtime. Swallow whole.   atorvastatin 20 MG tablet Commonly known as: LIPITOR Take 1 tablet (20 mg total) by mouth daily.   Biotin 1 MG Caps Take 1 mg by mouth daily.   carvedilol 3.125 MG tablet Commonly known as: COREG Take 1 tablet (3.125 mg total) by mouth 2 (two) times daily with a meal. Hold for blood pressure less than 90/60   cetirizine 10 MG tablet Commonly known as: ZYRTEC Take 10 mg by mouth every morning.   citalopram 10 MG tablet Commonly known as: CELEXA Take 10 mg by mouth daily.   Cranberry 450 MG Caps Take 1 capsule by mouth daily.   dapagliflozin propanediol 10 MG Tabs tablet Commonly known as: Farxiga Take 1 tablet (10 mg total) by mouth daily before breakfast.   folic acid 476 MCG tablet Commonly known as: FOLVITE Take 1 tablet (400 mcg total) by mouth daily. SEPARATE ALL SUPPLEMENTS TO LUNCH OR DINNER AND PRILOSEC NOT TO MESS W/THYROID MED   levothyroxine 88 MCG tablet Commonly known as: SYNTHROID Take 1 tablet (88 mcg total) by mouth daily before breakfast. Skip sundays   lidocaine 4  % Place 1 patch onto the skin daily. Remove & Discard patch within 12 hours or as directed by MD   losartan 25 MG tablet Commonly known as: COZAAR Take 0.5 tablets (12.5 mg total) by mouth daily. Hold for Blood pressure less than 90/60   magnesium oxide 400 (240 Mg) MG tablet Commonly known as: MAG-OX Take 1 tablet (400 mg total) by mouth daily.   melatonin 5 MG Tabs Take 1 tablet (5 mg total) by mouth at bedtime.   montelukast 10 MG tablet Commonly known as: SINGULAIR Take 1 tablet (10 mg total) by mouth at bedtime.   multivitamin with minerals Tabs tablet Take  1 tablet by mouth daily.   omeprazole 40 MG capsule Commonly known as: PRILOSEC Take 1 capsule (40 mg total) by mouth daily. After lunch   polyethylene glycol powder 17 GM/SCOOP powder Commonly known as: GLYCOLAX/MIRALAX Take 17 g by mouth daily as needed for moderate constipation.   potassium chloride SA 20 MEQ tablet Commonly known as: KLOR-CON M Take 1 tablet (20 mEq total) by mouth 2 (two) times daily.   pregabalin 75 MG capsule Commonly known as: LYRICA Take 75 mg by mouth 3 (three) times daily.   Torsemide 40 MG Tabs Take 40 mg by mouth daily.   traMADol 50 MG tablet Commonly known as: ULTRAM Take 50 mg by mouth every 6 (six) hours as needed.   traZODone 100 MG tablet Commonly known as: DESYREL Take by mouth.   vitamin B-12 1000 MCG tablet Commonly known as: CYANOCOBALAMIN Take 1 tablet (1,000 mcg total) by mouth daily.   Vitamin D3 125 MCG (5000 UT) Tabs Take 1 tablet (5,000 Units total) by mouth daily.        Allergies:  No Known Allergies  Family History: Family History  Problem Relation Age of Onset   Heart disease Mother    Hypertension Mother    Diabetes Mother    Heart attack Mother 35   Heart disease Father    Heart attack Father 76   Breast cancer Maternal Aunt    Heart attack Brother     Social History:   reports that she has never smoked. She has been exposed to  tobacco smoke. She has never used smokeless tobacco. She reports that she does not drink alcohol and does not use drugs.  Physical Exam: BP (!) 90/53 (BP Location: Left Arm, Patient Position: Sitting, Cuff Size: Large)   Pulse 66   Ht _0  (1.6 m)   BMI 30.82 kg/m   Constitutional:  Alert and oriented, no acute distress, nontoxic appearing HEENT: [place], AT Cardiovascular: No clubbing, cyanosis, or edema Respiratory: Normal respiratory effort, no increased work of breathing Skin: No rashes, bruises or suspicious lesions Neurologic: Grossly intact, no focal deficits, moving all 4 extremities Psychiatric: Normal mood and affect  Laboratory Data: Results for orders placed or performed in visit on 01/17/22  Bladder Scan (Post Void Residual) in office  Result Value Ref Range   Scan Result 59m    Assessment & Plan:   1. Urinary retention Resolved.  I reassured the patient that I do not think that her recent weight gain is attributable to her bladder residual and this is more likely cardiogenic in nature.  No indication for Foley catheter replacement at this time. - Bladder Scan (Post Void Residual) in office  2. OAB (overactive bladder) We discussed the treatment goals need to be realistic given diuretic use, as I suspect she will always have an element of urgency/frequency due to diuresis.  She prefers to stay off bladder agents for now, which is reasonable.  We will plan for symptom recheck in 3 months and consider restarting agents at that time.  Return in about 3 months (around 04/19/2022) for OAB follow up and symptom recheck.  SDebroah Loop PA-C  [facility] [address] [place] [phone]

## 2022-01-17 NOTE — Patient Instructions (Addendum)
We're going to keep you off medication for your overactive bladder for now. I'll see you back in clinic in 3 months and we'll consider restarting medication at that time. Please come back and see me sooner if you have any difficulties in the meantime.  FYI, 2 liters is 68 fluid ounces.

## 2022-01-22 DIAGNOSIS — I10 Essential (primary) hypertension: Secondary | ICD-10-CM | POA: Diagnosis not present

## 2022-01-22 DIAGNOSIS — Z8673 Personal history of transient ischemic attack (TIA), and cerebral infarction without residual deficits: Secondary | ICD-10-CM | POA: Diagnosis not present

## 2022-01-22 DIAGNOSIS — G629 Polyneuropathy, unspecified: Secondary | ICD-10-CM | POA: Diagnosis not present

## 2022-01-22 DIAGNOSIS — F419 Anxiety disorder, unspecified: Secondary | ICD-10-CM | POA: Diagnosis not present

## 2022-01-22 DIAGNOSIS — M810 Age-related osteoporosis without current pathological fracture: Secondary | ICD-10-CM | POA: Diagnosis not present

## 2022-01-22 DIAGNOSIS — M48062 Spinal stenosis, lumbar region with neurogenic claudication: Secondary | ICD-10-CM | POA: Diagnosis not present

## 2022-01-22 DIAGNOSIS — I509 Heart failure, unspecified: Secondary | ICD-10-CM | POA: Diagnosis not present

## 2022-01-22 DIAGNOSIS — R635 Abnormal weight gain: Secondary | ICD-10-CM | POA: Diagnosis not present

## 2022-01-24 ENCOUNTER — Telehealth: Payer: Self-pay | Admitting: Cardiology

## 2022-01-24 DIAGNOSIS — I509 Heart failure, unspecified: Secondary | ICD-10-CM | POA: Diagnosis not present

## 2022-01-24 DIAGNOSIS — M48062 Spinal stenosis, lumbar region with neurogenic claudication: Secondary | ICD-10-CM | POA: Diagnosis not present

## 2022-01-24 DIAGNOSIS — E782 Mixed hyperlipidemia: Secondary | ICD-10-CM | POA: Diagnosis not present

## 2022-01-24 DIAGNOSIS — M79621 Pain in right upper arm: Secondary | ICD-10-CM | POA: Diagnosis not present

## 2022-01-24 DIAGNOSIS — M19011 Primary osteoarthritis, right shoulder: Secondary | ICD-10-CM | POA: Diagnosis not present

## 2022-01-24 DIAGNOSIS — R635 Abnormal weight gain: Secondary | ICD-10-CM | POA: Diagnosis not present

## 2022-01-24 DIAGNOSIS — Z79899 Other long term (current) drug therapy: Secondary | ICD-10-CM | POA: Diagnosis not present

## 2022-01-24 DIAGNOSIS — I10 Essential (primary) hypertension: Secondary | ICD-10-CM | POA: Diagnosis not present

## 2022-01-24 DIAGNOSIS — F419 Anxiety disorder, unspecified: Secondary | ICD-10-CM | POA: Diagnosis not present

## 2022-01-24 DIAGNOSIS — E039 Hypothyroidism, unspecified: Secondary | ICD-10-CM | POA: Diagnosis not present

## 2022-01-24 DIAGNOSIS — M25521 Pain in right elbow: Secondary | ICD-10-CM | POA: Diagnosis not present

## 2022-01-24 NOTE — Telephone Encounter (Signed)
Received call directly to triage from NP "Mardene Celeste" from [facility] re this pt wanting to speak with the DOD.   Secure chat sent to DOD to return her call @ [phone].

## 2022-01-24 NOTE — Telephone Encounter (Signed)
Shirlean Kelly NP from [facility] would like to speak to DOD about this patient.

## 2022-01-24 NOTE — Telephone Encounter (Signed)
Received a call from Rod Mae, NP from [facility] about Kimberly Cook.  Apparently for the last several days she has been complaining of increased fullness and swelling.  Her weight has increased a couple pounds over the last couple days and the 2 pounds overnight.  Requesting earlier than scheduled appointment which is currently scheduled on 717 with Kimberly Ducking, NP.  Recommendation was to give additional 20 mg torsemide daily and conjunction with the existing 40 mg dose until back to baseline weight.  We will try to move her scheduled appointment up to be seen sooner than the 17th.   Bryan Lemma, MD

## 2022-01-24 NOTE — Telephone Encounter (Signed)
Scheduled with cadence

## 2022-01-29 ENCOUNTER — Ambulatory Visit (INDEPENDENT_AMBULATORY_CARE_PROVIDER_SITE_OTHER): Payer: Medicare HMO | Admitting: Medical

## 2022-01-29 ENCOUNTER — Encounter: Payer: Self-pay | Admitting: Medical

## 2022-01-29 VITALS — BP 124/70 | HR 61 | Ht 64.0 in | Wt 179.0 lb

## 2022-01-29 DIAGNOSIS — I10 Essential (primary) hypertension: Secondary | ICD-10-CM | POA: Diagnosis not present

## 2022-01-29 DIAGNOSIS — R0989 Other specified symptoms and signs involving the circulatory and respiratory systems: Secondary | ICD-10-CM

## 2022-01-29 DIAGNOSIS — I25118 Atherosclerotic heart disease of native coronary artery with other forms of angina pectoris: Secondary | ICD-10-CM | POA: Diagnosis not present

## 2022-01-29 DIAGNOSIS — I5022 Chronic systolic (congestive) heart failure: Secondary | ICD-10-CM

## 2022-01-29 DIAGNOSIS — I3139 Other pericardial effusion (noninflammatory): Secondary | ICD-10-CM

## 2022-01-29 DIAGNOSIS — M25511 Pain in right shoulder: Secondary | ICD-10-CM | POA: Diagnosis not present

## 2022-01-29 DIAGNOSIS — I428 Other cardiomyopathies: Secondary | ICD-10-CM

## 2022-01-29 DIAGNOSIS — M810 Age-related osteoporosis without current pathological fracture: Secondary | ICD-10-CM | POA: Diagnosis not present

## 2022-01-29 DIAGNOSIS — Z8673 Personal history of transient ischemic attack (TIA), and cerebral infarction without residual deficits: Secondary | ICD-10-CM | POA: Diagnosis not present

## 2022-01-29 DIAGNOSIS — I509 Heart failure, unspecified: Secondary | ICD-10-CM | POA: Diagnosis not present

## 2022-01-29 DIAGNOSIS — F419 Anxiety disorder, unspecified: Secondary | ICD-10-CM | POA: Diagnosis not present

## 2022-01-29 DIAGNOSIS — Z79899 Other long term (current) drug therapy: Secondary | ICD-10-CM | POA: Diagnosis not present

## 2022-01-29 DIAGNOSIS — K219 Gastro-esophageal reflux disease without esophagitis: Secondary | ICD-10-CM | POA: Diagnosis not present

## 2022-01-29 NOTE — Progress Notes (Signed)
Cardiology Office Note:    Date:  01/29/2022   ID:  Kimberly, Cook January 03, 1946, MRN [id number]  PCP:  System, Provider Not In  [facility] Cardiologist:  Yvonne Kendall, MD  [facility] Electrophysiologist:  None   Referring MD: No ref. provider found   Chief Complaint: 1 month follow-up  History of Present Illness:    Kimberly Cook is a 76 y.o. female with a hx of moderate coronary artery disease by cardiac CTA, hypertension, hyperlipidemia, "mini strokes," GERD, depression, and anxiety, who presents for follow-up of nonobstructive coronary artery disease and HFrEF who presents for 1 month follow-up.  She was hospitalized in January with worsening shortness of breath and leg swelling.  Echo at that time showed newly reduced LVEF of 30-35% with moderate mitral regurgitation and moderate pericardial effusion.  She underwent cardiac catheterization that showed mild proximal RCA disease but otherwise no significant stenosis.  She underwent thoracentesis and escalation of her diuresis. In outpatient follow-up it was noted weight was slwoly increasing.   She was last seen 01/04/22 and noted continual weight gain. Limiting fluid intake was recommended. Torsemide was increased to 40mg  daily.   ER visit 01/12/22 for chest pain. Vitals were normal. Ddimer was elevated. CTA chest was negative for PE or acute finding, it showed chronic hiatal hernia. DVT US was negative. Hs trop negative x 2. BNP wnl. She had some leukocytosis. EKG showed some QTC prolongation, which improved. It was chest pain was MSK. BP was noted to be low with recommendation for PCP follow-up.   Today, the patient reports she has chronic right arm pain, likely needs to see orthopaedic surgeon. The day she went to the ER she had chest pain in the center of the chest, doesn't remember much about that day. She still has occasional chest tightness, she is TTP. Also has shortness of breath.  She walks daily, once or twice  around [facility] facility. She feels she has to stop and take more breaks. She is taking Torsemide 40mg  daily. Appears euvolemic on exam. She does have a h/o GERD on a PPI.EKG today with no ischemic changes.   Past Medical History:  Diagnosis Date   Allergy    Anxiety    Arthritis    Back pain    Chronic HFrEF (heart failure with reduced ejection fraction) (HCC)    a. 08/2021 echo: EF 30-35%, glob HK, GrI DD; b. 10/2021 Echo: EF 30-35%.   CKD (chronic kidney disease), stage II    Coronary artery disease    a. Mild to moderate CAD in LAD/diagonal by CTA (CT-FFR of apical LAD 0.79); b. 08/2021 Cath: LM nl, LAD min irregs, D1/2/3 nl, LCX nl, OM2/3 nl, RCA 20p, RPDA mild dzs, RPAV nl.   DDD (degenerative disc disease), lumbar    Depression    Diverticulosis    Essential hypertension    GERD (gastroesophageal reflux disease)    Headache    History of shingles    Hyperlipidemia    Hypothyroidism    Lung nodule    Mini stroke 2011   Moderate Pericardial effusion    a. 08/2021 Echo: moderate circumferential pericardial effusion w/o tamponade; b. 10/2021 Echo: EF 30-35%, small to mod circumferential pericardial effusion w/o tamponade.   NICM (nonischemic cardiomyopathy) (HCC)    a. 08/2021 Echo: EF 30-35%, glob HK, GrI DD, nl RV fxn, mild-mod dil LA, mod circumferential pericardial eff w/o tamponade, Mod MR; b. 10/2021 Echo: EF 30-35%, glob HK.   Occipital  neuralgia    Palpitations    Pleural effusion on left    a. 08/2021 s/p thoracentesis.   Pneumonia 2018   Prediabetes    Stroke [facility])    Stroke (HCC)    MRI 04/2008 + left sup. frontal gyrus possibly puntate infarct    Syncope 2019   Urinary tract infection    Vitamin D deficiency     Past Surgical History:  Procedure Laterality Date   ABDOMINAL HYSTERECTOMY     BLADDER SURGERY     2003   BREAST EXCISIONAL BIOPSY Right Over 20 years    Benign   CHOLECYSTECTOMY     gastroplication      JOINT REPLACEMENT Left    KNEE   KNEE  ARTHROSCOPY Left 2011   PULSE GENERATOR IMPLANT Left 01/31/2020   Procedure: PLACEMENT RIGHT FLANK PULSE GENERATOR VS REMOVAL SPINAL CORD STIMULATOR;  Surgeon: Lucy Chris, MD;  Location: [facility] ORS;  Service: Neurosurgery;  Laterality: Left;   PULSE GENERATOR IMPLANT Left 04/24/2020   Procedure: REPLACEMENT LEFT FLANK PULSE GENERATOR IMPLANT;  Surgeon: Lucy Chris, MD;  Location: [facility] ORS;  Service: Neurosurgery;  Laterality: Left;  MAC w/ local   right arm fracture     RIGHT/LEFT HEART CATH AND CORONARY ANGIOGRAPHY N/A 08/28/2021   Procedure: RIGHT/LEFT HEART CATH AND CORONARY ANGIOGRAPHY;  Surgeon: Iran Ouch, MD;  Location: [facility] INVASIVE CV LAB;  Service: Cardiovascular;  Laterality: N/A;   SPINAL CORD STIMULATOR REMOVAL N/A 06/26/2020   Procedure: SPINAL CORD STIMULATOR REMOVAL;  Surgeon: Lucy Chris, MD;  Location: [facility] ORS;  Service: Neurosurgery;  Laterality: N/A;   THORACIC LAMINECTOMY FOR SPINAL CORD STIMULATOR N/A 01/24/2020   Procedure: THORACIC SPINAL CORD STIMULATOR PADDLE TRIAL VIA LAMINECTOMY;  Surgeon: Lucy Chris, MD;  Location: [facility] ORS;  Service: Neurosurgery;  Laterality: N/A;   TONSILLECTOMY AND ADENOIDECTOMY     TOTAL KNEE ARTHROPLASTY Left 04/17/2015   Procedure: LEFT TOTAL KNEE ARTHROPLASTY;  Surgeon: Durene Romans, MD;  Location: [facility] ORS;  Service: Orthopedics;  Laterality: Left;    Current Medications: Current Meds  Medication Sig   acetaminophen (TYLENOL) 500 MG tablet Take 1,000 mg by mouth every 8 (eight) hours as needed for moderate pain.   alendronate (FOSAMAX) 70 MG tablet Take 70 mg by mouth once a week.    ALPRAZolam (XANAX) 0.5 MG tablet Take 0.5 mg by mouth 2 (two) times daily as needed.   ascorbic acid (VITAMIN C) 500 MG tablet Take 500 mg by mouth daily.   aspirin EC 81 MG tablet Take 1 tablet (81 mg total) by mouth at bedtime. Swallow whole.   atorvastatin (LIPITOR) 20 MG tablet Take 1 tablet (20 mg total) by mouth daily.   Biotin 1 MG CAPS Take 1  mg by mouth daily.    carvedilol (COREG) 3.125 MG tablet Take 1 tablet (3.125 mg total) by mouth 2 (two) times daily with a meal. Hold for blood pressure less than 90/60   cetirizine (ZYRTEC) 5 MG tablet Take 5 mg by mouth daily.   Cholecalciferol (VITAMIN D3) 125 MCG (5000 UT) TABS Take 1 tablet (5,000 Units total) by mouth daily.   citalopram (CELEXA) 20 MG tablet Take 20 mg by mouth daily.   Cranberry 450 MG TABS Take 1 tablet by mouth every morning.   dapagliflozin propanediol (FARXIGA) 10 MG TABS tablet Take 1 tablet (10 mg total) by mouth daily before breakfast.   folic acid (FOLVITE) 400 MCG tablet Take 1 tablet (400 mcg total) by mouth daily.  SEPARATE ALL SUPPLEMENTS TO LUNCH OR DINNER AND PRILOSEC NOT TO MESS W/THYROID MED   levothyroxine (SYNTHROID) 88 MCG tablet Take 1 tablet (88 mcg total) by mouth daily before breakfast. Skip sundays   lidocaine 4 % Place 1 patch onto the skin daily. Remove & Discard patch within 12 hours or as directed by MD   losartan (COZAAR) 25 MG tablet Take 0.5 tablets (12.5 mg total) by mouth daily. Hold for Blood pressure less than 90/60   magnesium oxide (MAG-OX) 400 (240 Mg) MG tablet Take 1 tablet (400 mg total) by mouth daily.   melatonin 5 MG TABS Take 1 tablet (5 mg total) by mouth at bedtime.   montelukast (SINGULAIR) 10 MG tablet Take 1 tablet (10 mg total) by mouth at bedtime.   Multiple Vitamin (MULTIVITAMIN WITH MINERALS) TABS tablet Take 1 tablet by mouth daily.   omeprazole (PRILOSEC) 40 MG capsule Take 1 capsule (40 mg total) by mouth daily. After lunch   polyethylene glycol powder (GLYCOLAX/MIRALAX) 17 GM/SCOOP powder Take 17 g by mouth daily as needed for moderate constipation.   potassium chloride SA (KLOR-CON M) 20 MEQ tablet Take 1 tablet (20 mEq total) by mouth 2 (two) times daily.   pregabalin (LYRICA) 75 MG capsule Take 75 mg by mouth 3 (three) times daily.   torsemide (DEMADEX) 20 MG tablet Take 40 mg by mouth daily.   traMADol (ULTRAM)  50 MG tablet Take 50 mg by mouth every 6 (six) hours as needed.   traZODone (DESYREL) 100 MG tablet Take 100 mg by mouth at bedtime.   vitamin B-12 (CYANOCOBALAMIN) 1000 MCG tablet Take 1 tablet (1,000 mcg total) by mouth daily.     Allergies:   Patient has no known allergies.   Social History   Socioeconomic History   Marital status: Widowed    Spouse name: Not on file   Number of children: Not on file   Years of education: Not on file   Highest education level: Not on file  Occupational History   Not on file  Tobacco Use   Smoking status: Never    Passive exposure: Yes   Smokeless tobacco: Never   Tobacco comments:    husbands and children smoked in home.   Vaping Use   Vaping Use: Never used  Substance and Sexual Activity   Alcohol use: No   Drug use: No   Sexual activity: Never  Other Topics Concern   Not on file  Social History Narrative   Widowed    L[facility] at [facility]   Has kids and grandson    Social Determinants of Health   Financial Resource Strain: Low Risk  (07/24/2020)   Overall Financial Resource Strain (CARDIA)    Difficulty of Paying Living Expenses: Not hard at all  Food Insecurity: No Food Insecurity (07/24/2020)   Hunger Vital Sign    Worried About Running Out of Food in the Last Year: Never true    Ran Out of Food in the Last Year: Never true  Transportation Needs: No Transportation Needs (07/24/2020)   PRAPARE - Administrator, Civil Service (Medical): No    Lack of Transportation (Non-Medical): No  Physical Activity: Unknown (07/24/2020)   Exercise Vital Sign    Days of Exercise per Week: 0 days    Minutes of Exercise per Session: Not on file  Stress: No Stress Concern Present (07/24/2020)   [employer] of Occupational Health - Occupational Stress Questionnaire    Feeling  of Stress : Only a little  Social Connections: Unknown (07/24/2020)   Social Connection and Isolation Panel [NHANES]    Frequency of Communication  with Friends and Family: More than three times a week    Frequency of Social Gatherings with Friends and Family: More than three times a week    Attends Religious Services: Not on Marketing executive or Organizations: Not on file    Attends Banker Meetings: Not on file    Marital Status: Not on file     Family History: The patient's family history includes Breast cancer in her maternal aunt; Diabetes in her mother; Heart attack in her brother; Heart attack (age of onset: 7) in her mother; Heart attack (age of onset: 62) in her father; Heart disease in her father and mother; Hypertension in her mother.  ROS:   Please see the history of present illness.     All other systems reviewed and are negative.  EKGs/Labs/Other Studies Reviewed:    The following studies were reviewed today:  Echo limited 10/2021  1. Left ventricular ejection fraction, by estimation, is 30 to 35%. The  left ventricle has moderately decreased function. The left ventricle  demonstrates global hypokinesis. The left ventricular internal cavity size  was mildly dilated.   2. Right ventricular systolic function is normal. The right ventricular  size is normal.   3. A smll to moderate pericardial effusion is present, 1 14 up to 1.68 cm  off the LV free wall, 0.66 cm off tyhe RV free wall. The pericardial  effusion is circumferential. There is no evidence of cardiac tamponade.   4. The mitral valve is normal in structure. No evidence of mitral valve  regurgitation. No evidence of mitral stenosis.   5. The aortic valve is normal in structure. Aortic valve regurgitation is  not visualized. Aortic valve sclerosis/calcification is present, without  any evidence of aortic stenosis.   6. The inferior vena cava is normal in size with greater than 50%  respiratory variability, suggesting right atrial pressure of 3 mmHg.   Comparison(s): LVEF 30-35%, Moderate pericardial effusion.   Cardiac cath  08/2021     Prox RCA lesion is 20% stenosed.   1.  Mild nonobstructive coronary artery disease. 2.  Left ventricular angiography was not performed.  EF was 30 to 35% by echo. 3.  Right heart catheterization showed mildly elevated right and left filling pressures, mild pulmonary hypertension and mildly reduced cardiac output.   Recommendations: Recommend medical therapy for nonischemic cardiomyopathy. The patient continues to be mildly volume overloaded.  We will continue IV furosemide tonight and switch to oral furosemide tomorrow morning.  Echo 08/26/21 1. Left ventricular ejection fraction, by estimation, is 30 to 35%. The  left ventricle has moderately decreased function. The left ventricle  demonstrates global hypokinesis. The left ventricular internal cavity size  was mildly dilated. Left ventricular  diastolic parameters are consistent with Grade I diastolic dysfunction  (impaired relaxation).   2. Right ventricular systolic function is normal. The right ventricular  size is normal. Tricuspid regurgitation signal is inadequate for assessing  PA pressure.   3. Left atrial size was mild to moderately dilated.   4. Moderate pericardial effusion. The pericardial effusion is  circumferential. There is no evidence of cardiac tamponade. Moderate  pleural effusion.   5. The mitral valve is normal in structure. Moderate mitral valve  regurgitation. No evidence of mitral stenosis.   6. The aortic valve  is normal in structure. Aortic valve regurgitation is  not visualized. Aortic valve sclerosis is present, with no evidence of  aortic valve stenosis.   Echo 2018 Study Conclusions   - Left ventricle: The cavity size was normal. There was mild focal    basal hypertrophy of the septum. Systolic function was normal.    The estimated ejection fraction was in the range of 50% to 55%.    Wall motion was normal; there were no regional wall motion    abnormalities. Doppler parameters are  consistent with abnormal    left ventricular relaxation (grade 1 diastolic dysfunction).  - Mitral valve: Calcified annulus.   Impressions:   - Normal LV systolic function; mild diastolic dysfunction.   EKG:  EKG is  ordered today.  The ekg ordered today demonstrates NSR 61bpm, LAD,nonspecific T wave changes  Recent Labs: 03/31/2021: ALT 19 01/11/2022: Hemoglobin 11.0; Platelets 159 01/12/2022: B Natriuretic Peptide 21.1; Magnesium 2.2 01/17/2022: BUN 25; Creatinine, Ser 1.01; Potassium 3.5; Sodium 140  Recent Lipid Panel    Component Value Date/Time   CHOL 125 11/24/2019 0946   CHOL 119 08/22/2017 0920   TRIG 158.0 (H) 11/24/2019 0946   HDL 46.40 11/24/2019 0946   HDL 57 08/22/2017 0920   CHOLHDL 3 11/24/2019 0946   VLDL 31.6 11/24/2019 0946   LDLCALC 47 11/24/2019 0946   LDLCALC 46 08/22/2017 0920     Risk Assessment/Calculations:    Physical Exam:    VS:  BP 124/70 (BP Location: Right Arm, Patient Position: Sitting, Cuff Size: Normal)   Pulse 61   Ht 5\' 4"  (1.626 m)   Wt 179 lb (81.2 kg)   SpO2 97%   BMI 30.73 kg/m     Wt Readings from Last 3 Encounters:  01/29/22 179 lb (81.2 kg)  01/11/22 174 lb (78.9 kg)  01/04/22 176 lb 12.8 oz (80.2 kg)     GEN:  Well nourished, well developed in no acute distress HEENT: Normal NECK: No JVD; No carotid bruits LYMPHATICS: No lymphadenopathy CARDIAC: RRR, no murmurs, rubs, gallops RESPIRATORY:  Clear to auscultation without rales, wheezing or rhonchi  ABDOMEN: Soft, non-tender, non-distended MUSCULOSKELETAL:  No edema; No deformity  SKIN: Warm and dry NEUROLOGIC:  Alert and oriented x 3 PSYCHIATRIC:  Normal affect   ASSESSMENT:    1. Chronic HFrEF (heart failure with reduced ejection fraction) (HCC)   2. Coronary artery disease involving native coronary artery of native heart with other form of angina pectoris (HCC)   3. NICM (nonischemic cardiomyopathy) (HCC)   4. Pericardial effusion   5. Labile hypertension     PLAN:    In order of problems listed above:  HFrEF NICM She reports persistent DOE and feels she has gained weight in her abdomen. Torsemide was increased at the last visit to 40mg  daily. She appears euvolemic on exam today. BNP on 6/10 was normal. Unsure etiology of SOB, may be general deconditioning. I will repeat a limited echo to evaluated EF. If this is still low will plan for referral to EP for ICD consideration. Continue Losartan and Coreg. Hypotension limits GDMT.   Chest pain Nonobstructive CAD LHC in 08/2021 showed nonobstructive CAD. Recent ER visit for chest pain with negative cardiac work-up. EKG with no significant ST/T wave changes. CTA chest showed no PE, but did show hiatal hernia. She is tender on exam, suspect MSK. Hernia may be contributing, she is on PPI. No further ischemic work-up indicated. Continue Aspirin, statin and BB therapy.  H/o Pericardial effusion Repeat limited echo as above. On last echo pericardial effusion was small to moderate.  Hypotension BP today is good. Continue current medications.   Disposition: Follow up in 1 month(s) with MD/APP     Signed, Jaxton Casale David Stall, PA-C  01/29/2022 12:15 PM    [facility]

## 2022-02-01 ENCOUNTER — Emergency Department
Admission: EM | Admit: 2022-02-01 | Discharge: 2022-02-02 | Disposition: A | Discharge status: Home / Self Care | Payer: Medicare HMO | Attending: Emergency Medicine | Admitting: Emergency Medicine

## 2022-02-01 ENCOUNTER — Other Ambulatory Visit: Payer: Self-pay

## 2022-02-01 ENCOUNTER — Emergency Department: Payer: Medicare HMO

## 2022-02-01 ENCOUNTER — Telehealth: Payer: Self-pay

## 2022-02-01 DIAGNOSIS — I10 Essential (primary) hypertension: Secondary | ICD-10-CM | POA: Diagnosis not present

## 2022-02-01 DIAGNOSIS — I2510 Atherosclerotic heart disease of native coronary artery without angina pectoris: Secondary | ICD-10-CM | POA: Diagnosis not present

## 2022-02-01 DIAGNOSIS — I700 Atherosclerosis of aorta: Secondary | ICD-10-CM | POA: Insufficient documentation

## 2022-02-01 DIAGNOSIS — N390 Urinary tract infection, site not specified: Secondary | ICD-10-CM | POA: Diagnosis not present

## 2022-02-01 DIAGNOSIS — N189 Chronic kidney disease, unspecified: Secondary | ICD-10-CM | POA: Insufficient documentation

## 2022-02-01 DIAGNOSIS — M25511 Pain in right shoulder: Secondary | ICD-10-CM | POA: Diagnosis not present

## 2022-02-01 DIAGNOSIS — E860 Dehydration: Secondary | ICD-10-CM

## 2022-02-01 DIAGNOSIS — I130 Hypertensive heart and chronic kidney disease with heart failure and stage 1 through stage 4 chronic kidney disease, or unspecified chronic kidney disease: Secondary | ICD-10-CM | POA: Diagnosis not present

## 2022-02-01 DIAGNOSIS — K449 Diaphragmatic hernia without obstruction or gangrene: Secondary | ICD-10-CM | POA: Diagnosis not present

## 2022-02-01 DIAGNOSIS — R5381 Other malaise: Secondary | ICD-10-CM | POA: Diagnosis not present

## 2022-02-01 DIAGNOSIS — N3289 Other specified disorders of bladder: Secondary | ICD-10-CM | POA: Diagnosis not present

## 2022-02-01 DIAGNOSIS — K5730 Diverticulosis of large intestine without perforation or abscess without bleeding: Secondary | ICD-10-CM | POA: Diagnosis not present

## 2022-02-01 DIAGNOSIS — R350 Frequency of micturition: Secondary | ICD-10-CM

## 2022-02-01 DIAGNOSIS — D7389 Other diseases of spleen: Secondary | ICD-10-CM | POA: Diagnosis not present

## 2022-02-01 DIAGNOSIS — R339 Retention of urine, unspecified: Secondary | ICD-10-CM | POA: Diagnosis present

## 2022-02-01 DIAGNOSIS — I509 Heart failure, unspecified: Secondary | ICD-10-CM | POA: Insufficient documentation

## 2022-02-01 LAB — URINALYSIS, ROUTINE W REFLEX MICROSCOPIC
Bilirubin Urine: NEGATIVE
Glucose, UA: >=500 mg/dL — AB
Hgb urine dipstick: NEGATIVE
Ketones, ur: NEGATIVE mg/dL
Nitrite: NEGATIVE
Protein, ur: NEGATIVE mg/dL
Specific Gravity, Urine: 1.005 (ref 1.005–1.030)
pH: 5 (ref 5.0–8.0)

## 2022-02-01 LAB — CBC
HCT: 39.3 % (ref 36.0–46.0)
Hemoglobin: 13.1 g/dL (ref 12.0–15.0)
MCH: 31.4 pg (ref 26.0–34.0)
MCHC: 33.3 g/dL (ref 30.0–36.0)
MCV: 94.2 fL (ref 80.0–100.0)
Platelets: 170 10*3/uL (ref 150–400)
RBC: 4.17 MIL/uL (ref 3.87–5.11)
RDW: 12.9 % (ref 11.5–15.5)
WBC: 5.1 10*3/uL (ref 4.0–10.5)
nRBC: 0 % (ref 0.0–0.2)

## 2022-02-01 LAB — BASIC METABOLIC PANEL
Anion gap: 10 (ref 5–15)
BUN: 23 mg/dL (ref 8–23)
CO2: 26 mmol/L (ref 22–32)
Calcium: 9.4 mg/dL (ref 8.9–10.3)
Chloride: 100 mmol/L (ref 98–111)
Creatinine, Ser: 1.42 mg/dL — ABNORMAL HIGH (ref 0.44–1.00)
GFR, Estimated: 38 mL/min — ABNORMAL LOW (ref >60)
Glucose, Bld: 97 mg/dL (ref 70–99)
Potassium: 3.6 mmol/L (ref 3.5–5.1)
Sodium: 136 mmol/L (ref 135–145)

## 2022-02-01 MED ORDER — TRAMADOL HCL 50 MG PO TABS
50.0000 mg | ORAL_TABLET | Freq: Once | ORAL | Status: AC
  Administered 2022-02-01: 50 mg via ORAL
  Filled 2022-02-01: qty 1

## 2022-02-01 MED ORDER — SODIUM CHLORIDE 0.9 % IV SOLN
1.0000 g | INTRAVENOUS | Status: AC
  Administered 2022-02-01: 1 g via INTRAVENOUS
  Filled 2022-02-01: qty 10

## 2022-02-01 MED ORDER — SODIUM CHLORIDE 0.9 % IV BOLUS
500.0000 mL | Freq: Once | INTRAVENOUS | Status: AC
  Administered 2022-02-01: 500 mL via INTRAVENOUS

## 2022-02-01 MED ORDER — CEPHALEXIN 500 MG PO CAPS: 500.0000 mg | ORAL_CAPSULE | Freq: Two times a day (BID) | ORAL | 0 refills | Status: AC

## 2022-02-01 NOTE — ED Triage Notes (Signed)
PAtient arrived by EMS from [facility] with c/o urine retention. Worsening within the past week. Feeling progressively sob X1 week. Also c/o pain in right shoulder for a few weeks. Has had imaging that was unremarkable on shoulder. Hx CHF and take diuretic. EMs vitals 99% RA, 125/76

## 2022-02-01 NOTE — ED Provider Triage Note (Signed)
Emergency Medicine Provider Triage Evaluation Note  Kimberly Cook , a 77 y.o. female  was evaluated in triage.  Pt complains of urinary retention x 2 days and chronic right shoulder pain. If she presses on the sides of her abdomen, urine comes out. No recent shoulder injury  Physical Exam  BP (!) 143/83   Pulse 67   Temp 98.7 F (37.1 C) (Oral)   Resp 18   SpO2 97%  Gen:   Awake, no distress   Resp:  Normal effort  MSK:   Moves extremities without difficulty  Other:    Medical Decision Making  Medically screening exam initiated at 6:23 PM.  Appropriate orders placed.  Kimberly Cook was informed that the remainder of the evaluation will be completed by another provider, this initial triage assessment does not replace that evaluation, and the importance of remaining in the ED until their evaluation is complete.  Under urine in bladder on scan.   Chinita Pester, FNP 02/01/22 1825

## 2022-02-01 NOTE — ED Notes (Signed)
Pt assisted to commode. Pt with 119 ml's on bladder scan after using toilet.

## 2022-02-01 NOTE — ED Notes (Signed)
Pt to CT

## 2022-02-01 NOTE — ED Triage Notes (Addendum)
Pt states she has had urinary retention where she had to press sides/bladder to finish urinating. Pt also c/o right shoulder pain and right arm pain w/ movement. Pt denies injury to right arm. See also original triage note. Pt c/o mild CP which she states is muscular in nature, SHOB and fatigue. Pt is AOX4, NAD noted.

## 2022-02-01 NOTE — ED Provider Notes (Signed)
[facility] Provider Note    Event Date/Time   First MD Initiated Contact with Patient 02/01/22 2139     (approximate)   History   Urinary Retention   HPI  Kimberly Cook is a 76 y.o. female history of congestive heart failure, chronic kidney disease coronary artery disease hypertension hyperlipidemia  Patient reports she has been experiencing several days of increased urinary frequency.  She reports that she feels like she cannot urinate all the way or has a frequent urge to urinate and that she has been having the almost pushed down on her bladder to make it feel like he can empty.  No fevers or chills.  No nausea or vomiting.  No chest pain or shortness of breath.  She does relate when she exerts herself and walks about the unit at the nursing home she does have to stop and rest though after walking a distance  She is also been experiencing some pain in her right shoulder when she moves it about for about a month now, but relates its not the primary cause for come today.  There is no pain at rest but when she goes to lift herself up to use walker or after using the toilet she gets soreness across her right shoulder.     Physical Exam   Triage Vital Signs: ED Triage Vitals  Enc Vitals Group     BP 02/01/22 1758 (!) 143/83     Pulse Rate 02/01/22 1758 67     Resp 02/01/22 1758 18     Temp 02/01/22 1758 98.7 F (37.1 C)     Temp Source 02/01/22 1758 Oral     SpO2 02/01/22 1758 97 %     Weight --      Height --      Head Circumference --      Peak Flow --      Pain Score 02/01/22 1800 4     Pain Loc --      Pain Edu? --      Excl. in GC? --     Most recent vital signs: Vitals:   02/01/22 2245 02/01/22 2300  BP: 135/77 120/81  Pulse: 70 62  Resp:  17  Temp:    SpO2: 98% 100%     General: Awake, no distress.  CV:  Good peripheral perfusion.  Normal heart sounds Resp:  Normal effort.  Clear lungs bilaterally.  Patient sits up  without difficulty.  Lung sounds are clear.  Normal work of breathing normal oxygen saturation on room air. Abd:  No distention.  Abdomen soft nontender nondistended throughout.  Patient does report an urge feeling.  Of note postvoid residual was performed and shows less than 200 mL residual Other:  No noted edema.  Mucous membranes.  Just slightly dry.  No lesions or abnormalities noted over the right shoulder right elbow right hand.  Strong radial pulse normal perfusion and good use of the right hand.  Patient does report tenderness and soreness when ranging the right shoulder through range of motion.  No skin lesions or joint effusions are noted.  Reproducible with movement of the right shoulder  Vitals:   02/01/22 2300 02/01/22 2335  BP: 120/81 (!) 137/92  Pulse: 62 68  Resp: 17 17  Temp:    SpO2: 100% 100%      ED Results / Procedures / Treatments   Labs (all labs ordered are listed, but only abnormal results are displayed) Labs  Reviewed  URINALYSIS, ROUTINE W REFLEX MICROSCOPIC - Abnormal; Notable for the following components:      Result Value   Color, Urine STRAW (*)    APPearance HAZY (*)    Glucose, UA >=500 (*)    Leukocytes,Ua MODERATE (*)    Bacteria, UA MANY (*)    All other components within normal limits  BASIC METABOLIC PANEL - Abnormal; Notable for the following components:   Creatinine, Ser 1.42 (*)    GFR, Estimated 38 (*)    All other components within normal limits  URINE CULTURE  CBC     EKG  Interpreted by me at 1820 heart rate 65 QRS 80 QTc 470 Normal sinus rhythm no evidence of acute ischemia.  Slight baseline artifact   RADIOLOGY   CT imaging interpreted by me, no gross obstructive uropathy denoted  CT Renal Stone Study  Result Date: 02/01/2022 CLINICAL DATA:  Urinary retention EXAM: CT ABDOMEN AND PELVIS WITHOUT CONTRAST TECHNIQUE: Multidetector CT imaging of the abdomen and pelvis was performed following the standard protocol without IV  contrast. RADIATION DOSE REDUCTION: This exam was performed according to the departmental dose-optimization program which includes automated exposure control, adjustment of the mA and/or kV according to patient size and/or use of iterative reconstruction technique. COMPARISON:  10/19/2019 FINDINGS: Lower chest: No acute abnormality. Hepatobiliary: No focal liver abnormality is seen. Status post cholecystectomy. No biliary dilatation. Pancreas: Unremarkable. No pancreatic ductal dilatation or surrounding inflammatory changes. Spleen: Mildly prominent with scattered calcified granulomas. This is stable from the prior exam. Adrenals/Urinary Tract: Adrenal glands are within normal limits. Kidneys demonstrate no renal calculi or obstructive changes. The bladder is well distended but shows some wall thickening laterally on the right. A small focus of air is noted likely related to recent instrumentation. Stomach/Bowel: Diverticular change of the sigmoid colon is again seen without evidence of diverticulitis. No obstructive or inflammatory changes of the colon are noted. The appendix is not well visualized. No inflammatory changes to suggest appendicitis are noted. Small bowel is within normal limits. Postsurgical changes in the stomach are seen. Persistent hiatal hernia is noted stable from the prior study. Vascular/Lymphatic: Aortic atherosclerosis. No enlarged abdominal or pelvic lymph nodes. Reproductive: Status post hysterectomy. No adnexal masses. Other: No abdominal wall hernia or abnormality. No abdominopelvic ascites. Musculoskeletal: Postsurgical changes are noted extending from T10-S2. No acute abnormality is noted. IMPRESSION: Mild bladder wall thickening laterally new from the prior exam. This could be related to prior diverticulitis and reactive thickening. Direct visualization may be helpful. Diverticulosis without diverticulitis. Postsurgical changes in the stomach and hiatal hernia stable in appearance  from the prior exam. Electronically Signed   By: Alcide Clever M.D.   On: 02/01/2022 22:33    Radiologist notes mild bladder wall thickening.   PROCEDURES:  Critical Care performed: No  Procedures   MEDICATIONS ORDERED IN ED: Medications  cefTRIAXone (ROCEPHIN) 1 g in sodium chloride 0.9 % 100 mL IVPB (1 g Intravenous New Bag/Given 02/01/22 2306)  sodium chloride 0.9 % bolus 500 mL (500 mLs Intravenous New Bag/Given 02/01/22 2306)  traMADol (ULTRAM) tablet 50 mg (50 mg Oral Given 02/01/22 2248)     IMPRESSION / MDM / ASSESSMENT AND PLAN / ED COURSE  I reviewed the triage vital signs and the nursing notes.                              Differential diagnosis includes, but is not  limited to, probable urinary tract infection, no clinical signs or symptoms of pyelonephritis, possible bladder outlet obstruction, nephrolithiasis, referred sense of urgency from other intra-abdominal inflammation though this seems unlikely given the clinical examination and history.  Additionally she has no evidence of CHF noted by clinical exam or history at this time, appears slightly volume depleted with mildly reduced GFR.  Will give fluid bolus and reassess.  Suspect urgency and symptoms of dysuria related to urinary tract infection.  Postvoid residual reassuring, I suspect the feeling of incomplete emptying is likely due to inflammation associated UTI as I do not have evidence to support obstructive uropathy or bladder outlet obstruction noted on imaging or by postvoid residual at this time.  Urinalysis is resulted demonstrates evidence of urinary tract infection.  Sent for culture.  Rocephin given and will prescribe Keflex for ongoing administration at her nursing facility.  After receiving fluid bolus blood pressure normal patient resting comfortably discussed options with patient including treatment recommendations and she is comfortable with plan to return to [place], continuation  of antibiotics and careful return precautions.  Regarding her right shoulder pain I did make recommendation that she may wish to follow-up with orthopedics I suspect this is likely musculoskeletal and she reports she actually had an x-ray and was told that she had arthritis in the shoulder just recently as well.  Certainly no evidence to suggest acute traumatic injury to the shoulder with very reassuring exam but I suspect she likely does have some underlying arthritis or tendinitis at play there as well.  Return precautions and treatment recommendations and follow-up discussed with the patient who is agreeable with the plan.   Patient's presentation is most consistent with acute complicated illness / injury requiring diagnostic workup.   The patient is on the cardiac monitor to evaluate for evidence of arrhythmia and/or significant heart rate changes.      FINAL CLINICAL IMPRESSION(S) / ED DIAGNOSES   Final diagnoses:  Urinary tract infection, acute  Urinary frequency  Dehydration, mild     Rx / DC Orders   ED Discharge Orders          Ordered    cephALEXin (KEFLEX) 500 MG capsule  2 times daily        02/01/22 2328             Note:  This document was prepared using Dragon voice recognition software and may include unintentional dictation errors.   Delman Kitten, MD 02/01/22 [phone]

## 2022-02-01 NOTE — Discharge Instructions (Signed)
Please follow-up with your facilities physician team within the next 48 hours for reevaluation and reassessment.  Return to the emergency department right away if you experience weakness, vomiting high fever, feel your symptoms are progressively worsening or other new concerns arise.

## 2022-02-01 NOTE — Telephone Encounter (Signed)
Pt facility [facility] called stating pt is complaining of frequent urination every 15 minutes. Pt states she is having difficulty voiding and has to press on her belly to get urine to empty. I asked facility if they are able to bring patient in to clinic now. Facility states all there drivers are gone today and they can not bring her. As per Harland German they will need to bring PT to ER since they are unable to get to clinic. Facility verbalized understanding.

## 2022-02-02 DIAGNOSIS — Z7401 Bed confinement status: Secondary | ICD-10-CM | POA: Diagnosis not present

## 2022-02-02 DIAGNOSIS — R5381 Other malaise: Secondary | ICD-10-CM | POA: Diagnosis not present

## 2022-02-02 DIAGNOSIS — R29898 Other symptoms and signs involving the musculoskeletal system: Secondary | ICD-10-CM | POA: Diagnosis not present

## 2022-02-02 NOTE — ED Notes (Addendum)
Called [facility] to ask about transportation for pt per Dr. Lorenza Chick request. No answer and no option to leave a message.Writer will try to call again momentarily to give report to caregiver at facility. Transport certificate of medical necessity completed and given to Diplomatic Services operational officer along with discharge paperwork.

## 2022-02-02 NOTE — ED Notes (Signed)
Patient is sleeping, chest rise and fall observed. Vitals are stable. Call bell within reach, siderails up x 2. No acute distress noted. Awaiting transport back to SNF.

## 2022-02-02 NOTE — ED Notes (Signed)
Attempted to call [facility] again to give report with no answer and no option to leave a message. Report given to [facility]

## 2022-02-04 LAB — URINE CULTURE: Culture: >=100000 — AB

## 2022-02-05 NOTE — Progress Notes (Signed)
ED Antimicrobial Stewardship Positive Culture Follow Up   Kimberly Cook is an 76 y.o. female who presented to [facility] on 02/01/2022 with a chief complaint of  Chief Complaint  Patient presents with   Urinary Retention    Recent Results (from the past 720 hour(s))  SARS Coronavirus 2 by RT PCR (hospital order, performed in [facility] hospital lab) *cepheid single result test* Anterior Nasal Swab     Status: None   Collection Time: 01/12/22  2:23 AM   Specimen: Anterior Nasal Swab  Result Value Ref Range Status   SARS Coronavirus 2 by RT PCR NEGATIVE NEGATIVE Final    Comment: (NOTE) SARS-CoV-2 target nucleic acids are NOT DETECTED.  The SARS-CoV-2 RNA is generally detectable in upper and lower respiratory specimens during the acute phase of infection. The lowest concentration of SARS-CoV-2 viral copies this assay can detect is 250 copies / mL. A negative result does not preclude SARS-CoV-2 infection and should not be used as the sole basis for treatment or other patient management decisions.  A negative result may occur with improper specimen collection / handling, submission of specimen other than nasopharyngeal swab, presence of viral mutation(s) within the areas targeted by this assay, and inadequate number of viral copies (<250 copies / mL). A negative result must be combined with clinical observations, patient history, and epidemiological information.  Fact Sheet for Patients:   RoadLapTop.co.za  Fact Sheet for Healthcare Providers: http://kim-miller.com/  This test is not yet approved or  cleared by the [place] FDA and has been authorized for detection and/or diagnosis of SARS-CoV-2 by FDA under an Emergency Use Authorization (EUA).  This EUA will remain in effect (meaning this test can be used) for the duration of the COVID-19 declaration under Section 564(b)(1) of the Act, 21 U.S.C. section 360bbb-3(b)(1), unless  the authorization is terminated or revoked sooner.  Performed at [facility], [address], [place], [place] [address]   Urine Culture     Status: Abnormal   Collection Time: 02/01/22  6:17 PM   Specimen: Urine, Clean Catch  Result Value Ref Range Status   Specimen Description   Final    URINE, CLEAN CATCH Performed at [facility], [address] [place], [place] [address]    Special Requests   Final    NONE Performed at [facility], [address] [place], [place] [address]    Culture (A)  Final    >=100,000 COLONIES/mL ESCHERICHIA COLI Confirmed Extended Spectrum Beta-Lactamase Producer (ESBL).  In bloodstream infections from ESBL organisms, carbapenems are preferred over piperacillin/tazobactam. They are shown to have a lower risk of mortality.    Report Status 02/04/2022 FINAL  Final   Organism ID, Bacteria ESCHERICHIA COLI (A)  Final      Susceptibility   Escherichia coli - MIC*    AMPICILLIN >=32 RESISTANT Resistant     CEFAZOLIN >=64 RESISTANT Resistant     CEFEPIME 16 RESISTANT Resistant     CEFTRIAXONE >=64 RESISTANT Resistant     CIPROFLOXACIN >=4 RESISTANT Resistant     GENTAMICIN <=1 SENSITIVE Sensitive     IMIPENEM <=0.25 SENSITIVE Sensitive     NITROFURANTOIN <=16 SENSITIVE Sensitive     TRIMETH/SULFA >=320 RESISTANT Resistant     AMPICILLIN/SULBACTAM 8 SENSITIVE Sensitive     PIP/TAZO <=4 SENSITIVE Sensitive     * >=100,000 COLONIES/mL ESCHERICHIA COLI    [x]  Treated with cephalexin, organism resistant to prescribed antimicrobial  Patient is resident at [facility] facility  7/4: called [facility] [phone] and faxed copy of Urine cx report to them [phone]. And explained that the RN should make pt's provider aware that her antibiotic does not cover the organism.   Eddis Pingleton A 02/05/2022, 1:19 PM Clinical Pharmacist

## 2022-02-06 DIAGNOSIS — M542 Cervicalgia: Secondary | ICD-10-CM | POA: Diagnosis not present

## 2022-02-06 DIAGNOSIS — M19011 Primary osteoarthritis, right shoulder: Secondary | ICD-10-CM | POA: Diagnosis not present

## 2022-02-06 DIAGNOSIS — Z79899 Other long term (current) drug therapy: Secondary | ICD-10-CM | POA: Diagnosis not present

## 2022-02-06 DIAGNOSIS — K5900 Constipation, unspecified: Secondary | ICD-10-CM | POA: Diagnosis not present

## 2022-02-06 DIAGNOSIS — M419 Scoliosis, unspecified: Secondary | ICD-10-CM | POA: Diagnosis not present

## 2022-02-06 DIAGNOSIS — G629 Polyneuropathy, unspecified: Secondary | ICD-10-CM | POA: Diagnosis not present

## 2022-02-06 DIAGNOSIS — F419 Anxiety disorder, unspecified: Secondary | ICD-10-CM | POA: Diagnosis not present

## 2022-02-06 DIAGNOSIS — G4700 Insomnia, unspecified: Secondary | ICD-10-CM | POA: Diagnosis not present

## 2022-02-06 DIAGNOSIS — F32A Depression, unspecified: Secondary | ICD-10-CM | POA: Diagnosis not present

## 2022-02-06 DIAGNOSIS — G8929 Other chronic pain: Secondary | ICD-10-CM | POA: Diagnosis not present

## 2022-02-14 DIAGNOSIS — Z79899 Other long term (current) drug therapy: Secondary | ICD-10-CM | POA: Diagnosis not present

## 2022-02-14 DIAGNOSIS — G8929 Other chronic pain: Secondary | ICD-10-CM | POA: Diagnosis not present

## 2022-02-14 DIAGNOSIS — F4323 Adjustment disorder with mixed anxiety and depressed mood: Secondary | ICD-10-CM | POA: Diagnosis not present

## 2022-02-14 DIAGNOSIS — E039 Hypothyroidism, unspecified: Secondary | ICD-10-CM | POA: Diagnosis not present

## 2022-02-14 DIAGNOSIS — I10 Essential (primary) hypertension: Secondary | ICD-10-CM | POA: Diagnosis not present

## 2022-02-14 DIAGNOSIS — E782 Mixed hyperlipidemia: Secondary | ICD-10-CM | POA: Diagnosis not present

## 2022-02-14 DIAGNOSIS — M48062 Spinal stenosis, lumbar region with neurogenic claudication: Secondary | ICD-10-CM | POA: Diagnosis not present

## 2022-02-14 DIAGNOSIS — I509 Heart failure, unspecified: Secondary | ICD-10-CM | POA: Diagnosis not present

## 2022-02-18 ENCOUNTER — Ambulatory Visit: Payer: Medicare HMO | Admitting: Nurse Practitioner

## 2022-02-18 DIAGNOSIS — F5101 Primary insomnia: Secondary | ICD-10-CM | POA: Diagnosis not present

## 2022-02-18 DIAGNOSIS — F4323 Adjustment disorder with mixed anxiety and depressed mood: Secondary | ICD-10-CM | POA: Diagnosis not present

## 2022-02-19 DIAGNOSIS — M48062 Spinal stenosis, lumbar region with neurogenic claudication: Secondary | ICD-10-CM | POA: Diagnosis not present

## 2022-02-19 DIAGNOSIS — M810 Age-related osteoporosis without current pathological fracture: Secondary | ICD-10-CM | POA: Diagnosis not present

## 2022-02-19 DIAGNOSIS — W19XXXD Unspecified fall, subsequent encounter: Secondary | ICD-10-CM | POA: Diagnosis not present

## 2022-02-19 DIAGNOSIS — K219 Gastro-esophageal reflux disease without esophagitis: Secondary | ICD-10-CM | POA: Diagnosis not present

## 2022-02-19 DIAGNOSIS — E539 Vitamin B deficiency, unspecified: Secondary | ICD-10-CM | POA: Diagnosis not present

## 2022-02-19 DIAGNOSIS — M419 Scoliosis, unspecified: Secondary | ICD-10-CM | POA: Diagnosis not present

## 2022-02-19 DIAGNOSIS — K5900 Constipation, unspecified: Secondary | ICD-10-CM | POA: Diagnosis not present

## 2022-02-19 DIAGNOSIS — F32A Depression, unspecified: Secondary | ICD-10-CM | POA: Diagnosis not present

## 2022-02-20 DIAGNOSIS — G629 Polyneuropathy, unspecified: Secondary | ICD-10-CM | POA: Diagnosis not present

## 2022-02-20 DIAGNOSIS — J309 Allergic rhinitis, unspecified: Secondary | ICD-10-CM | POA: Diagnosis not present

## 2022-02-20 DIAGNOSIS — I509 Heart failure, unspecified: Secondary | ICD-10-CM | POA: Diagnosis not present

## 2022-02-20 DIAGNOSIS — K219 Gastro-esophageal reflux disease without esophagitis: Secondary | ICD-10-CM | POA: Diagnosis not present

## 2022-02-20 DIAGNOSIS — M25519 Pain in unspecified shoulder: Secondary | ICD-10-CM | POA: Diagnosis not present

## 2022-02-20 DIAGNOSIS — Z8673 Personal history of transient ischemic attack (TIA), and cerebral infarction without residual deficits: Secondary | ICD-10-CM | POA: Diagnosis not present

## 2022-02-20 DIAGNOSIS — M545 Low back pain, unspecified: Secondary | ICD-10-CM | POA: Diagnosis not present

## 2022-02-20 DIAGNOSIS — E039 Hypothyroidism, unspecified: Secondary | ICD-10-CM | POA: Diagnosis not present

## 2022-02-26 DIAGNOSIS — M48062 Spinal stenosis, lumbar region with neurogenic claudication: Secondary | ICD-10-CM | POA: Diagnosis not present

## 2022-02-26 DIAGNOSIS — K5900 Constipation, unspecified: Secondary | ICD-10-CM | POA: Diagnosis not present

## 2022-02-26 DIAGNOSIS — E782 Mixed hyperlipidemia: Secondary | ICD-10-CM | POA: Diagnosis not present

## 2022-02-26 DIAGNOSIS — F4323 Adjustment disorder with mixed anxiety and depressed mood: Secondary | ICD-10-CM | POA: Diagnosis not present

## 2022-02-26 DIAGNOSIS — E539 Vitamin B deficiency, unspecified: Secondary | ICD-10-CM | POA: Diagnosis not present

## 2022-02-26 DIAGNOSIS — E039 Hypothyroidism, unspecified: Secondary | ICD-10-CM | POA: Diagnosis not present

## 2022-02-26 DIAGNOSIS — K219 Gastro-esophageal reflux disease without esophagitis: Secondary | ICD-10-CM | POA: Diagnosis not present

## 2022-02-26 DIAGNOSIS — F5101 Primary insomnia: Secondary | ICD-10-CM | POA: Diagnosis not present

## 2022-03-01 ENCOUNTER — Ambulatory Visit (INDEPENDENT_AMBULATORY_CARE_PROVIDER_SITE_OTHER): Payer: Medicare HMO

## 2022-03-01 DIAGNOSIS — I5022 Chronic systolic (congestive) heart failure: Secondary | ICD-10-CM

## 2022-03-01 LAB — ECHOCARDIOGRAM LIMITED
Calc EF: 54.8 %
S' Lateral: 4 cm
Single Plane A2C EF: 49.6 %
Single Plane A4C EF: 57.9 %

## 2022-03-02 ENCOUNTER — Inpatient Hospital Stay
Admission: EM | Admit: 2022-03-02 | Discharge: 2022-03-06 | DRG: 689 | Disposition: A | Discharge status: Skilled Nursing Facility | Payer: Medicare HMO | Source: Skilled Nursing Facility | Attending: Internal Medicine | Admitting: Internal Medicine

## 2022-03-02 ENCOUNTER — Emergency Department: Payer: Medicare HMO

## 2022-03-02 ENCOUNTER — Other Ambulatory Visit: Payer: Self-pay

## 2022-03-02 DIAGNOSIS — I10 Essential (primary) hypertension: Secondary | ICD-10-CM | POA: Diagnosis present

## 2022-03-02 DIAGNOSIS — R4182 Altered mental status, unspecified: Secondary | ICD-10-CM

## 2022-03-02 DIAGNOSIS — N1831 Chronic kidney disease, stage 3a: Secondary | ICD-10-CM | POA: Diagnosis not present

## 2022-03-02 DIAGNOSIS — I2510 Atherosclerotic heart disease of native coronary artery without angina pectoris: Secondary | ICD-10-CM | POA: Diagnosis present

## 2022-03-02 DIAGNOSIS — E039 Hypothyroidism, unspecified: Secondary | ICD-10-CM | POA: Diagnosis present

## 2022-03-02 DIAGNOSIS — R6889 Other general symptoms and signs: Secondary | ICD-10-CM | POA: Diagnosis not present

## 2022-03-02 DIAGNOSIS — Z20822 Contact with and (suspected) exposure to covid-19: Secondary | ICD-10-CM | POA: Diagnosis present

## 2022-03-02 DIAGNOSIS — I959 Hypotension, unspecified: Secondary | ICD-10-CM | POA: Diagnosis not present

## 2022-03-02 DIAGNOSIS — Z9049 Acquired absence of other specified parts of digestive tract: Secondary | ICD-10-CM

## 2022-03-02 DIAGNOSIS — R5381 Other malaise: Secondary | ICD-10-CM | POA: Diagnosis not present

## 2022-03-02 DIAGNOSIS — N3090 Cystitis, unspecified without hematuria: Secondary | ICD-10-CM | POA: Diagnosis not present

## 2022-03-02 DIAGNOSIS — Z66 Do not resuscitate: Secondary | ICD-10-CM | POA: Diagnosis not present

## 2022-03-02 DIAGNOSIS — Z8673 Personal history of transient ischemic attack (TIA), and cerebral infarction without residual deficits: Secondary | ICD-10-CM

## 2022-03-02 DIAGNOSIS — R911 Solitary pulmonary nodule: Secondary | ICD-10-CM | POA: Diagnosis present

## 2022-03-02 DIAGNOSIS — I130 Hypertensive heart and chronic kidney disease with heart failure and stage 1 through stage 4 chronic kidney disease, or unspecified chronic kidney disease: Secondary | ICD-10-CM | POA: Diagnosis not present

## 2022-03-02 DIAGNOSIS — I5023 Acute on chronic systolic (congestive) heart failure: Secondary | ICD-10-CM | POA: Diagnosis not present

## 2022-03-02 DIAGNOSIS — E669 Obesity, unspecified: Secondary | ICD-10-CM | POA: Diagnosis not present

## 2022-03-02 DIAGNOSIS — Z7722 Contact with and (suspected) exposure to environmental tobacco smoke (acute) (chronic): Secondary | ICD-10-CM | POA: Diagnosis present

## 2022-03-02 DIAGNOSIS — N183 Chronic kidney disease, stage 3 unspecified: Secondary | ICD-10-CM | POA: Diagnosis present

## 2022-03-02 DIAGNOSIS — Z79890 Hormone replacement therapy (postmenopausal): Secondary | ICD-10-CM

## 2022-03-02 DIAGNOSIS — N390 Urinary tract infection, site not specified: Secondary | ICD-10-CM | POA: Diagnosis present

## 2022-03-02 DIAGNOSIS — Z79891 Long term (current) use of opiate analgesic: Secondary | ICD-10-CM

## 2022-03-02 DIAGNOSIS — R9082 White matter disease, unspecified: Secondary | ICD-10-CM | POA: Diagnosis not present

## 2022-03-02 DIAGNOSIS — G4700 Insomnia, unspecified: Secondary | ICD-10-CM | POA: Diagnosis present

## 2022-03-02 DIAGNOSIS — Z90710 Acquired absence of both cervix and uterus: Secondary | ICD-10-CM

## 2022-03-02 DIAGNOSIS — D696 Thrombocytopenia, unspecified: Secondary | ICD-10-CM | POA: Diagnosis not present

## 2022-03-02 DIAGNOSIS — F32A Depression, unspecified: Secondary | ICD-10-CM | POA: Diagnosis present

## 2022-03-02 DIAGNOSIS — Z8619 Personal history of other infectious and parasitic diseases: Secondary | ICD-10-CM

## 2022-03-02 DIAGNOSIS — R531 Weakness: Secondary | ICD-10-CM

## 2022-03-02 DIAGNOSIS — M1990 Unspecified osteoarthritis, unspecified site: Secondary | ICD-10-CM | POA: Diagnosis present

## 2022-03-02 DIAGNOSIS — E559 Vitamin D deficiency, unspecified: Secondary | ICD-10-CM | POA: Diagnosis present

## 2022-03-02 DIAGNOSIS — Z6832 Body mass index (BMI) 32.0-32.9, adult: Secondary | ICD-10-CM

## 2022-03-02 DIAGNOSIS — R4781 Slurred speech: Secondary | ICD-10-CM | POA: Diagnosis not present

## 2022-03-02 DIAGNOSIS — G928 Other toxic encephalopathy: Secondary | ICD-10-CM | POA: Diagnosis not present

## 2022-03-02 DIAGNOSIS — E782 Mixed hyperlipidemia: Secondary | ICD-10-CM

## 2022-03-02 DIAGNOSIS — Z87440 Personal history of urinary (tract) infections: Secondary | ICD-10-CM

## 2022-03-02 DIAGNOSIS — E785 Hyperlipidemia, unspecified: Secondary | ICD-10-CM | POA: Diagnosis present

## 2022-03-02 DIAGNOSIS — R200 Anesthesia of skin: Secondary | ICD-10-CM | POA: Diagnosis not present

## 2022-03-02 DIAGNOSIS — J9600 Acute respiratory failure, unspecified whether with hypoxia or hypercapnia: Secondary | ICD-10-CM | POA: Diagnosis not present

## 2022-03-02 DIAGNOSIS — N3000 Acute cystitis without hematuria: Secondary | ICD-10-CM | POA: Diagnosis not present

## 2022-03-02 DIAGNOSIS — K219 Gastro-esophageal reflux disease without esophagitis: Secondary | ICD-10-CM | POA: Diagnosis not present

## 2022-03-02 DIAGNOSIS — I5022 Chronic systolic (congestive) heart failure: Secondary | ICD-10-CM | POA: Diagnosis present

## 2022-03-02 DIAGNOSIS — R29898 Other symptoms and signs involving the musculoskeletal system: Secondary | ICD-10-CM | POA: Diagnosis not present

## 2022-03-02 DIAGNOSIS — M6281 Muscle weakness (generalized): Secondary | ICD-10-CM | POA: Diagnosis not present

## 2022-03-02 DIAGNOSIS — F419 Anxiety disorder, unspecified: Secondary | ICD-10-CM

## 2022-03-02 DIAGNOSIS — I428 Other cardiomyopathies: Secondary | ICD-10-CM | POA: Diagnosis not present

## 2022-03-02 DIAGNOSIS — Z96652 Presence of left artificial knee joint: Secondary | ICD-10-CM | POA: Diagnosis present

## 2022-03-02 DIAGNOSIS — N179 Acute kidney failure, unspecified: Secondary | ICD-10-CM | POA: Diagnosis not present

## 2022-03-02 DIAGNOSIS — B9620 Unspecified Escherichia coli [E. coli] as the cause of diseases classified elsewhere: Secondary | ICD-10-CM | POA: Diagnosis present

## 2022-03-02 DIAGNOSIS — Z79899 Other long term (current) drug therapy: Secondary | ICD-10-CM

## 2022-03-02 DIAGNOSIS — Z7983 Long term (current) use of bisphosphonates: Secondary | ICD-10-CM

## 2022-03-02 DIAGNOSIS — Z7401 Bed confinement status: Secondary | ICD-10-CM | POA: Diagnosis not present

## 2022-03-02 DIAGNOSIS — I672 Cerebral atherosclerosis: Secondary | ICD-10-CM | POA: Diagnosis not present

## 2022-03-02 DIAGNOSIS — R0902 Hypoxemia: Secondary | ICD-10-CM | POA: Diagnosis not present

## 2022-03-02 DIAGNOSIS — A499 Bacterial infection, unspecified: Secondary | ICD-10-CM

## 2022-03-02 DIAGNOSIS — Z9682 Presence of neurostimulator: Secondary | ICD-10-CM

## 2022-03-02 DIAGNOSIS — Z7982 Long term (current) use of aspirin: Secondary | ICD-10-CM

## 2022-03-02 DIAGNOSIS — Z8249 Family history of ischemic heart disease and other diseases of the circulatory system: Secondary | ICD-10-CM

## 2022-03-02 LAB — URINALYSIS, ROUTINE W REFLEX MICROSCOPIC
Bilirubin Urine: NEGATIVE
Glucose, UA: >=500 mg/dL — AB
Hgb urine dipstick: NEGATIVE
Ketones, ur: NEGATIVE mg/dL
Nitrite: POSITIVE — AB
Protein, ur: NEGATIVE mg/dL
Specific Gravity, Urine: 1.005 (ref 1.005–1.030)
Squamous Epithelial / HPF: NONE SEEN (ref 0–5)
WBC, UA: >50 WBC/hpf — ABNORMAL HIGH (ref 0–5)
pH: 6 (ref 5.0–8.0)

## 2022-03-02 LAB — BASIC METABOLIC PANEL
Anion gap: 8 (ref 5–15)
BUN: 29 mg/dL — ABNORMAL HIGH (ref 8–23)
CO2: 31 mmol/L (ref 22–32)
Calcium: 8.7 mg/dL — ABNORMAL LOW (ref 8.9–10.3)
Chloride: 102 mmol/L (ref 98–111)
Creatinine, Ser: 1.21 mg/dL — ABNORMAL HIGH (ref 0.44–1.00)
GFR, Estimated: 46 mL/min — ABNORMAL LOW (ref >60)
Glucose, Bld: 100 mg/dL — ABNORMAL HIGH (ref 70–99)
Potassium: 3.6 mmol/L (ref 3.5–5.1)
Sodium: 141 mmol/L (ref 135–145)

## 2022-03-02 LAB — CBC
HCT: 39.3 % (ref 36.0–46.0)
Hemoglobin: 12.7 g/dL (ref 12.0–15.0)
MCH: 30.9 pg (ref 26.0–34.0)
MCHC: 32.3 g/dL (ref 30.0–36.0)
MCV: 95.6 fL (ref 80.0–100.0)
Platelets: 133 10*3/uL — ABNORMAL LOW (ref 150–400)
RBC: 4.11 MIL/uL (ref 3.87–5.11)
RDW: 12.7 % (ref 11.5–15.5)
WBC: 4.2 10*3/uL (ref 4.0–10.5)
nRBC: 0 % (ref 0.0–0.2)

## 2022-03-02 LAB — VITAMIN B12: Vitamin B-12: 1773 pg/mL — ABNORMAL HIGH (ref 180–914)

## 2022-03-02 LAB — SARS CORONAVIRUS 2 BY RT PCR: SARS Coronavirus 2 by RT PCR: NEGATIVE

## 2022-03-02 MED ORDER — DAPAGLIFLOZIN PROPANEDIOL 10 MG PO TABS
10.0000 mg | ORAL_TABLET | Freq: Every day | ORAL | Status: DC
  Administered 2022-03-03 – 2022-03-04 (×2): 10 mg via ORAL
  Filled 2022-03-02 (×2): qty 1

## 2022-03-02 MED ORDER — PREGABALIN 75 MG PO CAPS
75.0000 mg | ORAL_CAPSULE | Freq: Three times a day (TID) | ORAL | Status: DC
  Administered 2022-03-02 – 2022-03-06 (×11): 75 mg via ORAL
  Filled 2022-03-02 (×11): qty 1

## 2022-03-02 MED ORDER — ACETAMINOPHEN 325 MG PO TABS
650.0000 mg | ORAL_TABLET | Freq: Four times a day (QID) | ORAL | Status: DC | PRN
  Administered 2022-03-02 – 2022-03-05 (×4): 650 mg via ORAL
  Filled 2022-03-02 (×5): qty 2

## 2022-03-02 MED ORDER — MELATONIN 5 MG PO TABS
5.0000 mg | ORAL_TABLET | Freq: Every evening | ORAL | Status: DC | PRN
Start: 2022-03-02 — End: 2022-03-06
  Filled 2022-03-02: qty 1

## 2022-03-02 MED ORDER — PANTOPRAZOLE SODIUM 40 MG PO TBEC
80.0000 mg | DELAYED_RELEASE_TABLET | Freq: Every day | ORAL | Status: DC
  Administered 2022-03-02 – 2022-03-06 (×5): 80 mg via ORAL
  Filled 2022-03-02 (×5): qty 2

## 2022-03-02 MED ORDER — POLYETHYLENE GLYCOL 3350 17 GM/SCOOP PO POWD: 17.0000 g | Freq: Every day | ORAL | Status: DC | PRN

## 2022-03-02 MED ORDER — SODIUM CHLORIDE 0.9 % IV SOLN: 1.0000 g | Freq: Two times a day (BID) | INTRAVENOUS | Status: DC

## 2022-03-02 MED ORDER — CARVEDILOL 3.125 MG PO TABS
3.1250 mg | ORAL_TABLET | Freq: Two times a day (BID) | ORAL | Status: DC
  Administered 2022-03-02 – 2022-03-06 (×8): 3.125 mg via ORAL
  Filled 2022-03-02 (×8): qty 1

## 2022-03-02 MED ORDER — LEVOTHYROXINE SODIUM 88 MCG PO TABS: 88.0000 ug | ORAL_TABLET | Freq: Every day | ORAL | Status: DC

## 2022-03-02 MED ORDER — LEVOTHYROXINE SODIUM 88 MCG PO TABS
88.0000 ug | ORAL_TABLET | ORAL | Status: DC
  Administered 2022-03-04 – 2022-03-06 (×3): 88 ug via ORAL
  Filled 2022-03-02 (×3): qty 1

## 2022-03-02 MED ORDER — ONDANSETRON HCL 4 MG PO TABS: 4.0000 mg | ORAL_TABLET | Freq: Four times a day (QID) | ORAL | Status: DC | PRN

## 2022-03-02 MED ORDER — TORSEMIDE 20 MG PO TABS
40.0000 mg | ORAL_TABLET | Freq: Every day | ORAL | Status: DC
  Administered 2022-03-03 – 2022-03-06 (×4): 40 mg via ORAL
  Filled 2022-03-02 (×4): qty 2

## 2022-03-02 MED ORDER — TRAMADOL HCL 50 MG PO TABS
50.0000 mg | ORAL_TABLET | Freq: Four times a day (QID) | ORAL | Status: DC | PRN
  Administered 2022-03-05 (×2): 50 mg via ORAL
  Filled 2022-03-02 (×2): qty 1

## 2022-03-02 MED ORDER — ENOXAPARIN SODIUM 60 MG/0.6ML IJ SOSY
0.5000 mg/kg | PREFILLED_SYRINGE | Freq: Every day | INTRAMUSCULAR | Status: DC
  Administered 2022-03-02: 42.5 mg via SUBCUTANEOUS
  Filled 2022-03-02: qty 0.6

## 2022-03-02 MED ORDER — VITAMIN B-12 1000 MCG PO TABS
1000.0000 ug | ORAL_TABLET | Freq: Every evening | ORAL | Status: DC
  Administered 2022-03-02 – 2022-03-05 (×4): 1000 ug via ORAL
  Filled 2022-03-02 (×4): qty 1

## 2022-03-02 MED ORDER — ATORVASTATIN CALCIUM 20 MG PO TABS
20.0000 mg | ORAL_TABLET | Freq: Every day | ORAL | Status: DC
  Administered 2022-03-02 – 2022-03-05 (×4): 20 mg via ORAL
  Filled 2022-03-02 (×4): qty 1

## 2022-03-02 MED ORDER — HYDRALAZINE HCL 20 MG/ML IJ SOLN
5.0000 mg | Freq: Four times a day (QID) | INTRAMUSCULAR | Status: AC | PRN
Start: 2022-03-02 — End: 2022-03-04

## 2022-03-02 MED ORDER — ASPIRIN 81 MG PO TBEC
81.0000 mg | DELAYED_RELEASE_TABLET | Freq: Every day | ORAL | Status: DC
  Administered 2022-03-02 – 2022-03-05 (×4): 81 mg via ORAL
  Filled 2022-03-02 (×4): qty 1

## 2022-03-02 MED ORDER — FOLIC ACID 1 MG PO TABS
500.0000 ug | ORAL_TABLET | Freq: Every evening | ORAL | Status: DC
  Administered 2022-03-02 – 2022-03-05 (×4): 0.5 mg via ORAL
  Filled 2022-03-02 (×4): qty 1

## 2022-03-02 MED ORDER — POLYETHYLENE GLYCOL 3350 17 G PO PACK: 17.0000 g | PACK | Freq: Every day | ORAL | Status: DC | PRN

## 2022-03-02 MED ORDER — TRAZODONE HCL 50 MG PO TABS
100.0000 mg | ORAL_TABLET | Freq: Every day | ORAL | Status: DC
Start: 2022-03-02 — End: 2022-03-02

## 2022-03-02 MED ORDER — SENNOSIDES-DOCUSATE SODIUM 8.6-50 MG PO TABS
1.0000 | ORAL_TABLET | Freq: Every evening | ORAL | Status: DC | PRN
  Administered 2022-03-02: 1 via ORAL
  Filled 2022-03-02: qty 1

## 2022-03-02 MED ORDER — ACETAMINOPHEN 650 MG RE SUPP
650.0000 mg | Freq: Four times a day (QID) | RECTAL | Status: DC | PRN
Start: 2022-03-02 — End: 2022-03-06

## 2022-03-02 MED ORDER — MELATONIN 5 MG PO TABS
5.0000 mg | ORAL_TABLET | Freq: Every day | ORAL | Status: DC
  Administered 2022-03-02 – 2022-03-05 (×4): 5 mg via ORAL
  Filled 2022-03-02 (×4): qty 1

## 2022-03-02 MED ORDER — ONDANSETRON HCL 4 MG/2ML IJ SOLN
4.0000 mg | Freq: Four times a day (QID) | INTRAMUSCULAR | Status: DC | PRN
  Administered 2022-03-02: 4 mg via INTRAVENOUS
  Filled 2022-03-02: qty 2

## 2022-03-02 MED ORDER — TRAZODONE HCL 50 MG PO TABS
100.0000 mg | ORAL_TABLET | Freq: Every evening | ORAL | Status: DC | PRN
  Administered 2022-03-03 – 2022-03-05 (×3): 100 mg via ORAL
  Filled 2022-03-02 (×3): qty 2

## 2022-03-02 MED ORDER — MONTELUKAST SODIUM 10 MG PO TABS
10.0000 mg | ORAL_TABLET | Freq: Every day | ORAL | Status: DC
  Administered 2022-03-02 – 2022-03-05 (×4): 10 mg via ORAL
  Filled 2022-03-02 (×4): qty 1

## 2022-03-02 MED ORDER — PIPERACILLIN-TAZOBACTAM 3.375 G IVPB 30 MIN
3.3750 g | Freq: Once | INTRAVENOUS | Status: AC
  Administered 2022-03-02: 3.375 g via INTRAVENOUS
  Filled 2022-03-02: qty 50

## 2022-03-02 MED ORDER — PIPERACILLIN-TAZOBACTAM 3.375 G IVPB
3.3750 g | Freq: Three times a day (TID) | INTRAVENOUS | Status: DC
  Administered 2022-03-02 – 2022-03-04 (×5): 3.375 g via INTRAVENOUS
  Filled 2022-03-02 (×5): qty 50

## 2022-03-02 MED ORDER — LIDOCAINE 5 % EX PTCH: 1.0000 | MEDICATED_PATCH | Freq: Every day | CUTANEOUS | Status: DC | PRN

## 2022-03-02 MED ORDER — LOSARTAN POTASSIUM 25 MG PO TABS
12.5000 mg | ORAL_TABLET | Freq: Every day | ORAL | Status: DC
  Administered 2022-03-03 – 2022-03-04 (×2): 12.5 mg via ORAL
  Filled 2022-03-02 (×2): qty 1

## 2022-03-02 MED ORDER — ADULT MULTIVITAMIN W/MINERALS CH
1.0000 | ORAL_TABLET | Freq: Every evening | ORAL | Status: DC
  Administered 2022-03-02 – 2022-03-05 (×4): 1 via ORAL
  Filled 2022-03-02 (×4): qty 1

## 2022-03-02 MED ORDER — CITALOPRAM HYDROBROMIDE 20 MG PO TABS
20.0000 mg | ORAL_TABLET | Freq: Every day | ORAL | Status: DC
  Administered 2022-03-03 – 2022-03-06 (×4): 20 mg via ORAL
  Filled 2022-03-02 (×4): qty 1

## 2022-03-02 NOTE — ED Triage Notes (Addendum)
Patient to Er via [facility] from [facility] with complaints of generalized weakness that started this morning around 8:55am. Patient reports feeling poorly for the last several days, facility staff administered a xanax this morning prior to onset of symptoms. Patient reports that she has had bad anxiety recently and that this is a new medication for her.   Denies hx of stroke, negative stroke screen with EMS. Patient does report "sliding out of my wheelchair" last week onto her knees, but denies hitting head or LOC.   Ems VSS- bp 104/72, temp 97.1, HR 86, o2 99%.

## 2022-03-02 NOTE — Hospital Course (Signed)
Kimberly Cook is a 76 year old female with history of hypertension, anxiety, depression, hypothyroid, GERD, insomnia, who presents to the emergency department for chief concerns of confusion.  Initial vitals in the emergency department showed temperature of 98.7, respiration rate of 13, heart rate of 59, blood pressure 115/76, SPO2 of 99% on room air.  Serum sodium is 141, potassium of 3.6, chloride 102, bicarb 31, BUN of 29, serum creatinine 1.21, GFR 46, nonfasting blood glucose 100, WBCs 4.2, hemoglobin 12.7, platelets 133.  ED treatment: Meropenem per pharmacy.

## 2022-03-02 NOTE — Assessment & Plan Note (Signed)
Continue PPI ?

## 2022-03-02 NOTE — ED Notes (Signed)
Patient transported to CT 

## 2022-03-02 NOTE — Assessment & Plan Note (Addendum)
Continued on home Coreg, losartan and torsemide. IV hydralazine as needed

## 2022-03-02 NOTE — Assessment & Plan Note (Signed)
Continue statin. 

## 2022-03-02 NOTE — H&P (Signed)
History and Physical   Kimberly Cook YNW:[id number] DOB: 06/11/1946 DOA: 03/02/2022  PCP: System, Provider Not In  Outpatient Specialists: Dr. Glenetta Hew, cardiology Patient coming from: Home via EMS  I have personally briefly reviewed patient's old medical records in [place].  Chief Concern: Altered mental status  HPI: [name] is a 76 year old female with history of hypertension, anxiety, depression, hypothyroid, GERD, insomnia, who presents to the emergency department for chief concerns of confusion.  Initial vitals in the emergency department showed temperature of 98.7, respiration rate of 13, heart rate of 59, blood pressure 115/76, SPO2 of 99% on room air.  Serum sodium is 141, potassium of 3.6, chloride 102, bicarb 31, BUN of 29, serum creatinine 1.21, GFR 46, nonfasting blood glucose 100, WBCs 4.2, hemoglobin 12.7, platelets 133.  ED treatment: Meropenem per pharmacy.  At bedside, she is able to tell me her name, age, current calendar year, and current location. She is able to tell me the correct day and month, July 29.   She reports she does not know why she is in the hospital. All she knows is that the staff at [facility] says, 'some thing is not right'.   She denies fever, dysphagia, chest pain, abdominal pain, diarrhea, hematuria.  She did endorse dysuria and baseline shortness of breath with exertion.  She endorses generalized weakness.  Social history: She lives at [facility]. She denies tobacco, etoh, recreational drug use. She is retired and formerly worked as Orthoptist for [employer].   ROS: Constitutional: no weight change, no fever ENT/Mouth: no sore throat, no rhinorrhea Eyes: no eye pain, no vision changes Cardiovascular: no chest pain, no dyspnea,  no edema, no palpitations Respiratory: no cough, no sputum, no wheezing Gastrointestinal: no nausea, no vomiting, no diarrhea, no constipation Genitourinary: no  urinary incontinence, no dysuria, no hematuria Musculoskeletal: no arthralgias, no myalgias Skin: no skin lesions, no pruritus, Neuro: + weakness, no loss of consciousness, no syncope Psych: no anxiety, no depression, + decrease appetite Heme/Lymph: no bruising, no bleeding  ED Course: Discussed with emergency medicine provider, patient requiring hospitalization for chief concerns of altered mental status.  Assessment/Plan  Principal Problem:   Altered mental status Active Problems:   Hyperlipidemia   GERD (gastroesophageal reflux disease)   Hypothyroidism   Essential hypertension   Anxiety and depression   Recurrent UTI   Weakness   History of stroke   Obesity (BMI 30-39.9)   Exercise intolerance   Left leg numbness   Assessment and Plan:  * Altered mental status - Etiology work-up in progress, I suspect this is multifactorial including pharmacologic secondary to new Xanax prescription and UTI - Hold home Xanax at this time - Treat UTI as above  Left leg numbness - She reports this is normal and baseline for her since after her spinal surgery - I have resumed her gabapentin 75 mg p.o. 3 times daily  Exercise intolerance - Progressive mobility  Obesity (BMI 65-78.4) - This complicates overall care and prognosis.   Weakness - Presumed at baseline  Recurrent UTI Possible new UTI, present on admission - Discontinued meropenem ordered by EDP - Patient has history of ESBL E. coli, sensitive to: Zosyn, nitrofurantoin, imipenem, gentamicin, and ampicillin/sulbactam - Add urine culture to prior collection - We will continue with Zosyn per pharmacy at this time  Anxiety and depression - Citalopram 20 mg daily resumed - Trazodone 100 mg nightly as needed for sleep  Essential hypertension -  Resumed home carvedilol 3.125 mg p.o. twice daily, losartan 12.5 mg p.o. daily, torsemide 40 mg daily - Hydralazine 5 mg IV every 6 hours as needed for SBP greater than 185, 2 days  ordered  Hypothyroidism - Resume levothyroxine 88 mcg daily before breakfast ordered  GERD (gastroesophageal reflux disease) - Resumed home PPI  Hyperlipidemia - Atorvastatin 20 mg nightly resumed  Chart reviewed.   Patient was seen and discharged from the [facility] ED on 02/01/2022 for urinary tract infection.  Patient was discharged home with cephalexin 500 mg p.o. twice daily  DVT prophylaxis: Enoxaparin Code Status: DNR/DNI, confirmed with patient Diet: Heart healthy Family Communication: A phone call was offered, patient declined stating that her kids know she is in the hospital ready Disposition Plan: Pending clinical course Consults called: None at this time Admission status: MedSurg, observation  Past Medical History:  Diagnosis Date   Allergy    Anxiety    Arthritis    Back pain    Chronic HFrEF (heart failure with reduced ejection fraction) ([place])    a. 08/2021 echo: EF 30-35%, glob HK, GrI DD; b. 10/2021 Echo: EF 30-35%.   CKD (chronic kidney disease), stage II    Coronary artery disease    a. Mild to moderate CAD in LAD/diagonal by CTA (CT-FFR of apical LAD 0.79); b. 08/2021 Cath: LM nl, LAD min irregs, D1/2/3 nl, LCX nl, OM2/3 nl, RCA 20p, RPDA mild dzs, RPAV nl.   DDD (degenerative disc disease), lumbar    Depression    Diverticulosis    Essential hypertension    GERD (gastroesophageal reflux disease)    Headache    History of shingles    Hyperlipidemia    Hypothyroidism    Lung nodule    Mini stroke 2011   Moderate Pericardial effusion    a. 08/2021 Echo: moderate circumferential pericardial effusion w/o tamponade; b. 10/2021 Echo: EF 30-35%, small to mod circumferential pericardial effusion w/o tamponade.   NICM (nonischemic cardiomyopathy) ([place])    a. 08/2021 Echo: EF 30-35%, glob HK, GrI DD, nl RV fxn, mild-mod dil LA, mod circumferential pericardial eff w/o tamponade, Mod MR; b. 10/2021 Echo: EF 30-35%, glob HK.   Occipital neuralgia    Palpitations    Pleural  effusion on left    a. 08/2021 s/p thoracentesis.   Pneumonia 2018   Prediabetes    Stroke [facility])    Stroke ([place])    MRI 04/2008 + left sup. frontal gyrus possibly puntate infarct    Syncope 2019   Urinary tract infection    Vitamin D deficiency    Past Surgical History:  Procedure Laterality Date   ABDOMINAL HYSTERECTOMY     BLADDER SURGERY     2003   BREAST EXCISIONAL BIOPSY Right Over 20 years    Benign   CHOLECYSTECTOMY     gastroplication      JOINT REPLACEMENT Left    KNEE   KNEE ARTHROSCOPY Left 2011   PULSE GENERATOR IMPLANT Left 01/31/2020   Procedure: PLACEMENT RIGHT FLANK PULSE GENERATOR VS REMOVAL SPINAL CORD STIMULATOR;  Surgeon: Deetta Perla, MD;  Location: [facility] ORS;  Service: Neurosurgery;  Laterality: Left;   PULSE GENERATOR IMPLANT Left 04/24/2020   Procedure: REPLACEMENT LEFT FLANK PULSE GENERATOR IMPLANT;  Surgeon: Deetta Perla, MD;  Location: [facility] ORS;  Service: Neurosurgery;  Laterality: Left;  MAC w/ local   right arm fracture     RIGHT/LEFT HEART CATH AND CORONARY ANGIOGRAPHY N/A 08/28/2021   Procedure: RIGHT/LEFT HEART CATH AND  CORONARY ANGIOGRAPHY;  Surgeon: Wellington Hampshire, MD;  Location: [place] [facility] LAB;  Service: Cardiovascular;  Laterality: N/A;   SPINAL CORD STIMULATOR REMOVAL N/A 06/26/2020   Procedure: SPINAL CORD STIMULATOR REMOVAL;  Surgeon: Deetta Perla, MD;  Location: [facility] ORS;  Service: Neurosurgery;  Laterality: N/A;   THORACIC LAMINECTOMY FOR SPINAL CORD STIMULATOR N/A 01/24/2020   Procedure: THORACIC SPINAL CORD STIMULATOR PADDLE TRIAL VIA LAMINECTOMY;  Surgeon: Deetta Perla, MD;  Location: [facility] ORS;  Service: Neurosurgery;  Laterality: N/A;   TONSILLECTOMY AND ADENOIDECTOMY     TOTAL KNEE ARTHROPLASTY Left 04/17/2015   Procedure: LEFT TOTAL KNEE ARTHROPLASTY;  Surgeon: Paralee Cancel, MD;  Location: [facility] ORS;  Service: Orthopedics;  Laterality: Left;   Social History:  reports that she has never smoked. She has been exposed to tobacco smoke.  She has never used smokeless tobacco. She reports that she does not drink alcohol and does not use drugs.  No Known Allergies Family History  Problem Relation Age of Onset   Heart disease Mother    Hypertension Mother    Diabetes Mother    Heart attack Mother 7   Heart disease Father    Heart attack Father 39   Breast cancer Maternal Aunt    Heart attack Brother    Family history: Family history reviewed and not pertinent.  Prior to Admission medications   Medication Sig Start Date End Date Taking? Authorizing Provider  acetaminophen (TYLENOL) 500 MG tablet Take 1,000 mg by mouth every 8 (eight) hours as needed for moderate pain.    [provider]  alendronate (FOSAMAX) 70 MG tablet Take 70 mg by mouth once a week.  10/23/19   [provider]  ALPRAZolam Duanne Moron) 0.5 MG tablet Take 0.5 mg by mouth 2 (two) times daily as needed. 12/24/21   [provider]  ascorbic acid (VITAMIN C) 500 MG tablet Take 500 mg by mouth daily.    [provider]  aspirin EC 81 MG tablet Take 1 tablet (81 mg total) by mouth at bedtime. Swallow whole. 10/25/20   McLean-Scocuzza, Nino Glow, MD  atorvastatin (LIPITOR) 20 MG tablet Take 1 tablet (20 mg total) by mouth daily. 06/20/20   McLean-Scocuzza, Nino Glow, MD  Biotin 1 MG CAPS Take 1 mg by mouth daily.     [provider]  carvedilol (COREG) 3.125 MG tablet Take 1 tablet (3.125 mg total) by mouth 2 (two) times daily with a meal. Hold for blood pressure less than 90/60 01/04/22   End, Harrell Gave, MD  cetirizine (ZYRTEC) 5 MG tablet Take 5 mg by mouth daily.    [provider]  Cholecalciferol (VITAMIN D3) 125 MCG (5000 UT) TABS Take 1 tablet (5,000 Units total) by mouth daily. 10/09/18   McLean-Scocuzza, Nino Glow, MD  citalopram (CELEXA) 20 MG tablet Take 20 mg by mouth daily. 01/10/22   [provider]  Cranberry 450 MG TABS Take 1 tablet by mouth every morning. 01/03/22   [provider]   dapagliflozin propanediol (FARXIGA) 10 MG TABS tablet Take 1 tablet (10 mg total) by mouth daily before breakfast. 09/11/21   Theora Gianotti, NP  folic acid (FOLVITE) 324 MCG tablet Take 1 tablet (400 mcg total) by mouth daily. SEPARATE ALL SUPPLEMENTS TO LUNCH OR DINNER AND PRILOSEC NOT TO MESS W/THYROID MED 12/10/18   McLean-Scocuzza, Nino Glow, MD  levothyroxine (SYNTHROID) 88 MCG tablet Take 1 tablet (88 mcg total) by mouth daily before breakfast. Skip sundays 12/02/19   McLean-Scocuzza,  Nino Glow, MD  lidocaine 4 % Place 1 patch onto the skin daily. Remove & Discard patch within 12 hours or as directed by MD    [provider]  losartan (COZAAR) 25 MG tablet Take 0.5 tablets (12.5 mg total) by mouth daily. Hold for Blood pressure less than 90/60 01/04/22   End, Harrell Gave, MD  magnesium oxide (MAG-OX) 400 (240 Mg) MG tablet Take 1 tablet (400 mg total) by mouth daily. 08/31/21   Nicole Kindred A, DO  melatonin 5 MG TABS Take 1 tablet (5 mg total) by mouth at bedtime. 04/04/21   Val Riles, MD  montelukast (SINGULAIR) 10 MG tablet Take 1 tablet (10 mg total) by mouth at bedtime. 02/14/20   McLean-Scocuzza, Nino Glow, MD  Multiple Vitamin (MULTIVITAMIN WITH MINERALS) TABS tablet Take 1 tablet by mouth daily.    [provider]  omeprazole (PRILOSEC) 40 MG capsule Take 1 capsule (40 mg total) by mouth daily. After lunch 06/23/20   McLean-Scocuzza, Nino Glow, MD  polyethylene glycol powder (GLYCOLAX/MIRALAX) 17 GM/SCOOP powder Take 17 g by mouth daily as needed for moderate constipation. 10/21/19   McLean-Scocuzza, Nino Glow, MD  potassium chloride SA (KLOR-CON M) 20 MEQ tablet Take 1 tablet (20 mEq total) by mouth 2 (two) times daily. 08/30/21   Ezekiel Slocumb, DO  pregabalin (LYRICA) 75 MG capsule Take 75 mg by mouth 3 (three) times daily.    [provider]  torsemide (DEMADEX) 20 MG tablet Take 40 mg by mouth daily.    [provider]  traMADol (ULTRAM) 50 MG  tablet Take 50 mg by mouth every 6 (six) hours as needed.    [provider]  traZODone (DESYREL) 100 MG tablet Take 100 mg by mouth at bedtime. 12/13/21   [provider]  vitamin B-12 (CYANOCOBALAMIN) 1000 MCG tablet Take 1 tablet (1,000 mcg total) by mouth daily. 10/09/18   McLean-Scocuzza, Nino Glow, MD   Physical Exam: Vitals:   03/02/22 1230 03/02/22 1259 03/02/22 1300 03/02/22 1331  BP: 125/69  97/62 120/86  Pulse: (!) 56  (!) 59 (!) 59  Resp: _0 Temp: 98.8 F (37.1 C)   97.8 F (36.6 C)  TempSrc: Oral     SpO2: 97%  95% 100%  Weight:  87.1 kg    Height:       Constitutional: appears age-appropriate, frail, NAD, calm, comfortable Eyes: PERRL, lids and conjunctivae normal ENMT: Mucous membranes are moist. Posterior pharynx clear of any exudate or lesions. Age-appropriate dentition. Hearing appropriate Neck: normal, supple, no masses, no thyromegaly Respiratory: clear to auscultation bilaterally, no wheezing, no crackles. Normal respiratory effort. No accessory muscle use.  Cardiovascular: Regular rate and rhythm, no murmurs / rubs / gallops. No extremity edema. 2+ pedal pulses. No carotid bruits.  Abdomen:  obese abdomen,no tenderness, no masses palpated, no hepatosplenomegaly. Bowel sounds positive.  Musculoskeletal: no clubbing / cyanosis. No joint deformity upper and lower extremities. Good ROM, no contractures, no atrophy. Normal muscle tone.  Skin: no rashes, lesions, ulcers. No induration Neurologic: Sensation intact. Strength 5/5 in all 4.  Psychiatric: Normal judgment and insight. Alert and oriented x 3. Normal mood.   EKG: independently reviewed, showing sinus rhythm with rate of 60, QTc 478, diffuse low voltage  Chest x-ray on Admission: I personally reviewed and I agree with radiologist reading as below.  CT HEAD WO CONTRAST (5MM)  Result Date: 03/02/2022 CLINICAL DATA:  76 year old female with a history of mental status  changes EXAM: CT HEAD  WITHOUT CONTRAST TECHNIQUE: Contiguous axial images were obtained from the base of the skull through the vertex without intravenous contrast. RADIATION DOSE REDUCTION: This exam was performed according to the departmental dose-optimization program which includes automated exposure control, adjustment of the mA and/or kV according to patient size and/or use of iterative reconstruction technique. COMPARISON:  04/01/2021 FINDINGS: Brain: No acute intracranial hemorrhage. No midline shift or mass effect. Gray-white differentiation maintained. Similar appearance of mild volume loss. Patchy hypodensity in the periventricular white matter. Unremarkable appearance of the ventricular system. Vascular: Intracranial atherosclerosis Skull: No acute fracture.  No aggressive bone lesion identified. Sinuses/Orbits: Unremarkable appearance of the orbits. Mastoid air cells clear. No middle ear effusion. No significant sinus disease. Other: None IMPRESSION: Negative for acute intracranial abnormality. Periventricular white matter disease and associated intracranial atherosclerosis Electronically Signed   By: Corrie Mckusick D.O.   On: 03/02/2022 11:28   DG Chest Portable 1 View  Result Date: 03/02/2022 CLINICAL DATA:  Weakness EXAM: PORTABLE CHEST 1 VIEW COMPARISON:  01/11/2022 FINDINGS: The heart size and mediastinal contours are within normal limits. No focal airspace consolidation, pleural effusion, or pneumothorax. Extensive thoracolumbar spinal fusion hardware. IMPRESSION: No active disease. Electronically Signed   By: Davina Poke D.O.   On: 03/02/2022 11:12   ECHOCARDIOGRAM LIMITED  Result Date: 03/01/2022    ECHOCARDIOGRAM LIMITED REPORT   Patient Name:   ELYSA WOMAC Date of Exam: 03/01/2022 Medical Rec #:  [id number]         Height:       64.0 in Accession #:    [id number]        Weight:       179.0 lb Date of Birth:  10-23-1945         BSA:          1.866 m Patient Age:    47 years          BP:            124/70 mmHg Patient Gender: F                 HR:           62 bpm. Exam Location:  [facility] Procedure: Limited Echo, Cardiac Doppler, Color Doppler and Strain Analysis Indications:    Chronic HFrEF (heart failure with reduced ejection fraction)                 (HCC) [I50.22 (ICD-10-CM)]  History:        Patient has prior history of Echocardiogram examinations, most                 recent 10/05/2021. Arrythmias:PAC and PVC,                 Signs/Symptoms:Dizziness/Lightheadedness and Syncope; Risk                 Factors:Hypertension.  Sonographer:    Luane School RDCS Referring Phys: [id number] [place]  1. Left ventricular ejection fraction, by estimation, is 45 to 50%. The left ventricle has mildly decreased function. The left ventricle has no regional wall motion abnormalities. Left ventricular diastolic parameters are indeterminate. The average left  ventricular global longitudinal strain is -16.8 %.  2. Right ventricular systolic function is normal. The right ventricular size is normal. There is normal pulmonary artery systolic pressure.  3. The mitral valve is normal in structure. No evidence of mitral valve regurgitation. No evidence of mitral  stenosis.  4. The aortic valve is normal in structure. Aortic valve regurgitation is not visualized. No aortic stenosis is present.  5. The inferior vena cava is normal in size with greater than 50% respiratory variability, suggesting right atrial pressure of 3 mmHg. FINDINGS  Left Ventricle: Left ventricular ejection fraction, by estimation, is 45 to 50%. The left ventricle has mildly decreased function. The left ventricle has no regional wall motion abnormalities. The average left ventricular global longitudinal strain is -16.8 %. The left ventricular internal cavity size was normal in size. There is no left ventricular hypertrophy. Left ventricular diastolic parameters are indeterminate. Right Ventricle: The right ventricular size is normal. No  increase in right ventricular wall thickness. Right ventricular systolic function is normal. There is normal pulmonary artery systolic pressure. The tricuspid regurgitant velocity is 2.23 m/s, and  with an assumed right atrial pressure of 3 mmHg, the estimated right ventricular systolic pressure is 78.5 mmHg. Left Atrium: Left atrial size was normal in size. Right Atrium: Right atrial size was normal in size. Pericardium: There is no evidence of pericardial effusion. Mitral Valve: The mitral valve is normal in structure. No evidence of mitral valve stenosis. Tricuspid Valve: The tricuspid valve is normal in structure. Tricuspid valve regurgitation is mild . No evidence of tricuspid stenosis. Aortic Valve: The aortic valve is normal in structure. Aortic valve regurgitation is not visualized. No aortic stenosis is present. Pulmonic Valve: The pulmonic valve was normal in structure. Pulmonic valve regurgitation is not visualized. No evidence of pulmonic stenosis. Aorta: The aortic root is normal in size and structure. Venous: The inferior vena cava is normal in size with greater than 50% respiratory variability, suggesting right atrial pressure of 3 mmHg. IAS/Shunts: No atrial level shunt detected by color flow Doppler. LEFT VENTRICLE PLAX 2D LVIDd:         5.40 cm LVIDs:         4.00 cm     2D Longitudinal Strain LV PW:         1.20 cm     2D Strain GLS Avg:     -16.8 % LV IVS:        1.20 cm LVOT diam:     2.00 cm LV SV:         63 LV SV Index:   34 LVOT Area:     3.14 cm  LV Volumes (MOD) LV vol d, MOD A2C: 52.2 ml LV vol d, MOD A4C: 55.3 ml LV vol s, MOD A2C: 26.3 ml LV vol s, MOD A4C: 23.3 ml LV SV MOD A2C:     25.9 ml LV SV MOD A4C:     55.3 ml LV SV MOD BP:      30.0 ml RIGHT VENTRICLE         IVC TAPSE (M-mode): 2.1 cm  IVC diam: 1.00 cm LEFT ATRIUM           Index        RIGHT ATRIUM           Index LA diam:      4.00 cm 2.14 cm/m   RA Area:     11.70 cm LA Vol (A2C): 32.1 ml 17.20 ml/m  RA Volume:   23.40  ml  12.54 ml/m LA Vol (A4C): 46.4 ml 24.86 ml/m  AORTIC VALVE LVOT Vmax:   89.40 cm/s LVOT Vmean:  60.800 cm/s LVOT VTI:    0.201 m  AORTA Ao Root diam: 3.00 cm Ao Asc  diam:  3.50 cm TRICUSPID VALVE TR Peak grad:   19.9 mmHg TR Vmax:        223.00 cm/s  SHUNTS Systemic VTI:  0.20 m Systemic Diam: 2.00 cm Ida Rogue MD Electronically signed by Ida Rogue MD Signature Date/Time: 03/01/2022/4:46:26 PM    Final     Labs on Admission: I have personally reviewed following labs  CBC: Recent Labs  Lab 03/02/22 1022  WBC 4.2  HGB 12.7  HCT 39.3  MCV 95.6  PLT 845*   Basic Metabolic Panel: Recent Labs  Lab 03/02/22 1022  NA 141  K 3.6  CL 102  CO2 31  GLUCOSE 100*  BUN 29*  CREATININE 1.21*  CALCIUM 8.7*   GFR: Estimated Creatinine Clearance: 41.6 mL/min (A) (by C-G formula based on SCr of 1.21 mg/dL (H)).  Urine analysis:    Component Value Date/Time   COLORURINE YELLOW (A) 03/02/2022 1022   APPEARANCEUR HAZY (A) 03/02/2022 1022   APPEARANCEUR Clear 11/30/2019 1340   LABSPEC 1.005 03/02/2022 1022   PHURINE 6.0 03/02/2022 1022   GLUCOSEU >=500 (A) 03/02/2022 1022   GLUCOSEU NEGATIVE 12/16/2017 1504   HGBUR NEGATIVE 03/02/2022 1022   BILIRUBINUR NEGATIVE 03/02/2022 1022   BILIRUBINUR Negative 11/30/2019 1340   KETONESUR NEGATIVE 03/02/2022 1022   PROTEINUR NEGATIVE 03/02/2022 1022   UROBILINOGEN 0.2 12/16/2017 1504   NITRITE POSITIVE (A) 03/02/2022 1022   LEUKOCYTESUR LARGE (A) 03/02/2022 1022   Dr. Tobie Poet [facility]  If 7PM-7AM, please contact overnight-coverage provider If 7AM-7PM, please contact day coverage provider [email or web address]  03/02/2022, 2:17 PM

## 2022-03-02 NOTE — Assessment & Plan Note (Signed)
Generalized, likely due to UTI in addition to residual since her spine surgery and hospital admission. -- PT OT evaluations -- SNF recommended Pt in LTC at [facility], requests SNF/rehab there when she returns -- Fall precautions -- Mobilize as much as tolerated -- Out of bed to chair

## 2022-03-02 NOTE — Assessment & Plan Note (Addendum)
Continue levothyroxine 

## 2022-03-02 NOTE — Assessment & Plan Note (Signed)
Continue home Celexa and trazodone

## 2022-03-02 NOTE — Assessment & Plan Note (Addendum)
Possible new UTI, present on admission - Discontinued meropenem ordered by EDP - Patient has history of ESBL E. coli, sensitive to: Zosyn, nitrofurantoin, imipenem, gentamicin, and ampicillin/sulbactam - Add urine culture to prior collection - We will continue with Zosyn per pharmacy at this time

## 2022-03-02 NOTE — Assessment & Plan Note (Signed)
Chronic, patient reports this is baseline since her spinal surgery -- Continue home gabapentin

## 2022-03-02 NOTE — Assessment & Plan Note (Signed)
POA, most likely due to UTI and potentially medications (Xanax) - Hold home Xanax for now - Treat UTI as outlined -- Delirium precautions -- Avoid sedating medications

## 2022-03-02 NOTE — ED Provider Notes (Signed)
[facility] Provider Note    Event Date/Time   First MD Initiated Contact with Patient 03/02/22 1018     (approximate)   History   Weakness   HPI  Kimberly Cook is a 76 y.o. female   extensive past medical history presents to the ER from facility due to "not looking right ".  Does have a history of anxiety and was given Xanax this morning.  Reportedly has been having increasing weakness over the past few days to weeks sliding out of her chair does not recall hitting her head.  No neck pain.  Denies any chest pain.  No nausea or vomiting.      Physical Exam   Triage Vital Signs: ED Triage Vitals  Enc Vitals Group     BP 03/02/22 1018 116/85     Pulse Rate 03/02/22 1018 60     Resp 03/02/22 1018 13     Temp 03/02/22 1018 98.7 F (37.1 C)     Temp Source 03/02/22 1018 Oral     SpO2 03/02/22 1018 97 %     Weight --      Height 03/02/22 1019 5\' 4"  (1.626 m)     Head Circumference --      Peak Flow --      Pain Score 03/02/22 1018 0     Pain Loc --      Pain Edu? --      Excl. in GC? --     Most recent vital signs: Vitals:   03/02/22 1100 03/02/22 1130  BP: 115/76 125/78  Pulse: (!) 59 (!) 55  Resp: 13   Temp:    SpO2: 99% 97%     Constitutional: drowsy, ill appearing Eyes: Conjunctivae are normal.  Head: Atraumatic. Nose: No congestion/rhinnorhea. Mouth/Throat: Mucous membranes are moist.   Neck: Painless ROM.  Cardiovascular:   Good peripheral circulation. Respiratory: Normal respiratory effort.  No retractions.  Gastrointestinal: Soft and nontender.  Musculoskeletal:  no deformity Neurologic:  MAE spontaneously. No gross focal neurologic deficits are appreciated.  Skin:  Skin is warm, dry and intact. No rash noted. Psychiatric: Mood and affect are normal. Speech and behavior are normal.    ED Results / Procedures / Treatments   Labs (all labs ordered are listed, but only abnormal results are displayed) Labs Reviewed   BASIC METABOLIC PANEL - Abnormal; Notable for the following components:      Result Value   Glucose, Bld 100 (*)    BUN 29 (*)    Creatinine, Ser 1.21 (*)    Calcium 8.7 (*)    GFR, Estimated 46 (*)    All other components within normal limits  CBC - Abnormal; Notable for the following components:   Platelets 133 (*)    All other components within normal limits  URINALYSIS, ROUTINE W REFLEX MICROSCOPIC - Abnormal; Notable for the following components:   Color, Urine YELLOW (*)    APPearance HAZY (*)    Glucose, UA >=500 (*)    Nitrite POSITIVE (*)    Leukocytes,Ua LARGE (*)    WBC, UA >50 (*)    Bacteria, UA FEW (*)    All other components within normal limits  SARS CORONAVIRUS 2 BY RT PCR  VITAMIN B12  CBG MONITORING, ED     EKG  ED ECG REPORT I, 03/04/22, the attending physician, personally viewed and interpreted this ECG.   Date: 03/02/2022  EKG Time: 10:18  Rate: 60  Rhythm: sinus  Axis: normal  Intervals:10:18  ST&T Change: sinus    RADIOLOGY Please see ED Course for my review and interpretation.  I personally reviewed all radiographic images ordered to evaluate for the above acute complaints and reviewed radiology reports and findings.  These findings were personally discussed with the patient.  Please see medical record for radiology report.    PROCEDURES:  Critical Care performed: No  Procedures   MEDICATIONS ORDERED IN ED: Medications  atorvastatin (LIPITOR) tablet 20 mg (has no administration in time range)  acetaminophen (TYLENOL) tablet 650 mg (has no administration in time range)    Or  acetaminophen (TYLENOL) suppository 650 mg (has no administration in time range)  ondansetron (ZOFRAN) tablet 4 mg (has no administration in time range)    Or  ondansetron (ZOFRAN) injection 4 mg (has no administration in time range)  enoxaparin (LOVENOX) injection 40 mg (has no administration in time range)  senna-docusate (Senokot-S) tablet 1  tablet (has no administration in time range)     IMPRESSION / MDM / ASSESSMENT AND PLAN / ED COURSE  I reviewed the triage vital signs and the nursing notes.                              Differential diagnosis includes, but is not limited to, Dehydration, sepsis, pna, uti, hypoglycemia, cva, drug effect, withdrawal, encephalitis   Patient presenting to the ER for ealuation of symptoms as described above. This presenting complaint could reflect a potentially life-threatening illness therefore the patient will be placed on continuous pulse oximetry and telemetry for monitoring.  Laboratory evaluation will be sent to evaluate for the above complaints.  No focal deficits.  Encephalopathy seems metabolic. Imaging ordered for the above differential.    Clinical Course as of 03/02/22 1241  Sat Mar 02, 2022  1131 Chest x-ray on my review and interpretation without any evidence of pneumothorax or consolidation.  BMP appears normal no white count no anemia.  Clinically I suspect this is secondary to the antianxiety medication she was given.  We will continue observe [PR]  1207 Patient's urinalysis does show evidence of many bacteria leukocytes and nitrites concerning for UTI.  Given her increase in altered mental status after the Xanax I do suspect that there is a large pharmacologic component of this but per report she has been having increasing weakness sliding out of her wheelchair over the past few days.  She was recently treated for UTI with Keflex.  On review of her urine culture it is ESBL E. coli.  Will order Merrem.  Will consult hospitalist for observation further medical management. [PR]    Clinical Course User Index [PR] Willy Eddy, MD     FINAL CLINICAL IMPRESSION(S) / ED DIAGNOSES   Final diagnoses:  Weakness  Cystitis  ESBL (extended spectrum beta-lactamase) producing bacteria infection     Rx / DC Orders   ED Discharge Orders     None        Note:  This  document was prepared using Dragon voice recognition software and may include unintentional dictation errors.    Willy Eddy, MD 03/02/22 1241

## 2022-03-02 NOTE — Assessment & Plan Note (Addendum)
Body mass index is 32.96 kg/m. Complicates overall care and prognosis.  Recommend lifestyle modifications including physical activity and diet for weight loss and overall long-term health.

## 2022-03-02 NOTE — Assessment & Plan Note (Signed)
Mobilize as much as tolerated, out of bed to chair at least each shift

## 2022-03-03 DIAGNOSIS — I2510 Atherosclerotic heart disease of native coronary artery without angina pectoris: Secondary | ICD-10-CM | POA: Diagnosis present

## 2022-03-03 DIAGNOSIS — B9620 Unspecified Escherichia coli [E. coli] as the cause of diseases classified elsewhere: Secondary | ICD-10-CM | POA: Diagnosis present

## 2022-03-03 DIAGNOSIS — F419 Anxiety disorder, unspecified: Secondary | ICD-10-CM | POA: Diagnosis present

## 2022-03-03 DIAGNOSIS — N179 Acute kidney failure, unspecified: Secondary | ICD-10-CM | POA: Diagnosis not present

## 2022-03-03 DIAGNOSIS — N1831 Chronic kidney disease, stage 3a: Secondary | ICD-10-CM | POA: Diagnosis present

## 2022-03-03 DIAGNOSIS — G928 Other toxic encephalopathy: Secondary | ICD-10-CM | POA: Diagnosis present

## 2022-03-03 DIAGNOSIS — I130 Hypertensive heart and chronic kidney disease with heart failure and stage 1 through stage 4 chronic kidney disease, or unspecified chronic kidney disease: Secondary | ICD-10-CM | POA: Diagnosis present

## 2022-03-03 DIAGNOSIS — N3000 Acute cystitis without hematuria: Secondary | ICD-10-CM | POA: Diagnosis not present

## 2022-03-03 DIAGNOSIS — I10 Essential (primary) hypertension: Secondary | ICD-10-CM | POA: Diagnosis not present

## 2022-03-03 DIAGNOSIS — M1990 Unspecified osteoarthritis, unspecified site: Secondary | ICD-10-CM | POA: Diagnosis present

## 2022-03-03 DIAGNOSIS — E782 Mixed hyperlipidemia: Secondary | ICD-10-CM | POA: Diagnosis not present

## 2022-03-03 DIAGNOSIS — E039 Hypothyroidism, unspecified: Secondary | ICD-10-CM | POA: Diagnosis present

## 2022-03-03 DIAGNOSIS — F32A Depression, unspecified: Secondary | ICD-10-CM | POA: Diagnosis present

## 2022-03-03 DIAGNOSIS — I428 Other cardiomyopathies: Secondary | ICD-10-CM | POA: Diagnosis present

## 2022-03-03 DIAGNOSIS — E669 Obesity, unspecified: Secondary | ICD-10-CM | POA: Diagnosis present

## 2022-03-03 DIAGNOSIS — N3090 Cystitis, unspecified without hematuria: Secondary | ICD-10-CM | POA: Diagnosis present

## 2022-03-03 DIAGNOSIS — R200 Anesthesia of skin: Secondary | ICD-10-CM | POA: Diagnosis present

## 2022-03-03 DIAGNOSIS — N390 Urinary tract infection, site not specified: Secondary | ICD-10-CM | POA: Diagnosis present

## 2022-03-03 DIAGNOSIS — Z20822 Contact with and (suspected) exposure to covid-19: Secondary | ICD-10-CM | POA: Diagnosis present

## 2022-03-03 DIAGNOSIS — R531 Weakness: Secondary | ICD-10-CM | POA: Diagnosis present

## 2022-03-03 DIAGNOSIS — I5022 Chronic systolic (congestive) heart failure: Secondary | ICD-10-CM | POA: Diagnosis present

## 2022-03-03 DIAGNOSIS — K219 Gastro-esophageal reflux disease without esophagitis: Secondary | ICD-10-CM | POA: Diagnosis present

## 2022-03-03 DIAGNOSIS — Z66 Do not resuscitate: Secondary | ICD-10-CM | POA: Diagnosis present

## 2022-03-03 DIAGNOSIS — R4182 Altered mental status, unspecified: Secondary | ICD-10-CM | POA: Diagnosis present

## 2022-03-03 DIAGNOSIS — R911 Solitary pulmonary nodule: Secondary | ICD-10-CM | POA: Diagnosis present

## 2022-03-03 DIAGNOSIS — E785 Hyperlipidemia, unspecified: Secondary | ICD-10-CM | POA: Diagnosis present

## 2022-03-03 DIAGNOSIS — E559 Vitamin D deficiency, unspecified: Secondary | ICD-10-CM | POA: Diagnosis present

## 2022-03-03 DIAGNOSIS — G4700 Insomnia, unspecified: Secondary | ICD-10-CM | POA: Diagnosis present

## 2022-03-03 DIAGNOSIS — D696 Thrombocytopenia, unspecified: Secondary | ICD-10-CM | POA: Diagnosis present

## 2022-03-03 LAB — BASIC METABOLIC PANEL
Anion gap: 8 (ref 5–15)
BUN: 27 mg/dL — ABNORMAL HIGH (ref 8–23)
CO2: 28 mmol/L (ref 22–32)
Calcium: 8.4 mg/dL — ABNORMAL LOW (ref 8.9–10.3)
Chloride: 101 mmol/L (ref 98–111)
Creatinine, Ser: 1.14 mg/dL — ABNORMAL HIGH (ref 0.44–1.00)
GFR, Estimated: 50 mL/min — ABNORMAL LOW (ref >60)
Glucose, Bld: 91 mg/dL (ref 70–99)
Potassium: 3.7 mmol/L (ref 3.5–5.1)
Sodium: 137 mmol/L (ref 135–145)

## 2022-03-03 LAB — CBC
HCT: 35.2 % — ABNORMAL LOW (ref 36.0–46.0)
Hemoglobin: 11.8 g/dL — ABNORMAL LOW (ref 12.0–15.0)
MCH: 31.6 pg (ref 26.0–34.0)
MCHC: 33.5 g/dL (ref 30.0–36.0)
MCV: 94.4 fL (ref 80.0–100.0)
Platelets: 155 K/uL (ref 150–400)
RBC: 3.73 MIL/uL — ABNORMAL LOW (ref 3.87–5.11)
RDW: 12.7 % (ref 11.5–15.5)
WBC: 4.8 K/uL (ref 4.0–10.5)
nRBC: 0 % (ref 0.0–0.2)

## 2022-03-03 MED ORDER — ENOXAPARIN SODIUM 40 MG/0.4ML IJ SOSY
40.0000 mg | PREFILLED_SYRINGE | Freq: Every day | INTRAMUSCULAR | Status: DC
  Administered 2022-03-03 – 2022-03-05 (×3): 40 mg via SUBCUTANEOUS
  Filled 2022-03-03 (×3): qty 0.4

## 2022-03-03 NOTE — Progress Notes (Signed)
Progress Note   Patient: Kimberly Cook DXA:[id number] DOB: 1945/09/16 DOA: 03/02/2022     0 DOS: the patient was seen and examined on 03/03/2022   Brief hospital course: Kimberly Cook is a 76 year old female with history of hypertension, anxiety, depression, hypothyroid, GERD, insomnia, who presented to the ED on 03/02/2022 [facility] for evaluation of confusion.  In the ED, vitals were stable aside from heart rate in the upper 50s..  Labs notable for creatinine 1.21 disease baseline appears around 1.0-1.1), mild thrombocytopenia 133, BUN 29, vitamin B12 elevated.  No fever or leukocytosis.  Urinalysis with evidence of UTI (large leukocytes, positive nitrite, few bacteria, greater than 50 WBCs).  Patient has prior history of ESBL UTIs and was treated in the ED with meropenem.  Admitted to the hospital and continued on Zosyn based on prior urine culture results.       Assessment and Plan: * Altered mental status POA, most likely due to UTI and potentially medications (Xanax) - Hold home Xanax for now - Treat UTI as outlined -- Delirium precautions -- Avoid sedating medications  UTI (urinary tract infection) As outlined  Left leg numbness Chronic, patient reports this is baseline since her spinal surgery -- Continue home gabapentin  Exercise intolerance Mobilize as much as tolerated, out of bed to chair at least each shift  Obesity (BMI 30-39.9) Body mass index is 32.96 kg/m. Complicates overall care and prognosis.  Recommend lifestyle modifications including physical activity and diet for weight loss and overall long-term health.   History of stroke No acute issues. Continue aspirin and statin  Weakness Generalized, likely due to UTI in addition to residual since her spine surgery and hospital admission. -- PT OT evaluations -- Fall precautions  Recurrent UTI POA, urinalysis was consistent with infection (large leukocytes, positive nitrite, few bacteria,  greater than 50 WBCs.  History of ESBL E. coli UTIs sensitive to Zosyn, Macrobid, imipenem, gentamicin, Unasyn. Treated with meropenem in the ED. -- Continue Zosyn --Follow-up pending urine culture   Anxiety and depression Continue home Celexa and trazodone  Essential hypertension Continued on home Coreg, losartan and torsemide. IV hydralazine as needed  Hypothyroidism Continue levothyroxine  GERD (gastroesophageal reflux disease) Continue PPI  Hyperlipidemia Continue statin        Subjective: Patient was awake resting in bed when seen on rounds today.  She reports being at [facility] for about a year since her spine surgery last year.  She has not yet recovered mobility and physical function to safely return home.  Reports being very tired and feeling generally unwell for a while now.  Denies abdominal pain nausea vomiting or urinary symptoms  Physical Exam: Vitals:   03/02/22 1625 03/02/22 1952 03/03/22 0502 03/03/22 0757  BP: (!) 131/110 135/83 101/80 104/66  Pulse: 67 70 83 76  Resp: 19 18 18 18   Temp: 97.8 F (36.6 C) 99.1 F (37.3 C) 98.9 F (37.2 C) 99.2 F (37.3 C)  TempSrc:      SpO2: 100% 99% 95% 93%  Weight:      Height:       General exam: awake, appears fatigued, no acute distress, obese HEENT: moist mucus membranes, hearing grossly normal  Respiratory system: CTAB with diminished bases, no wheezes, rales or rhonchi, normal respiratory effort. Cardiovascular system: normal S1/S2, RRR, no peripheral edema.   Gastrointestinal system: soft, NT, ND, no HSM felt, +bowel sounds. Central nervous system: A&O x4. no gross focal neurologic deficits, normal speech Extremities: moves  all, no enosis, normal tone Skin: dry, intact, normal temperature Psychiatry: normal mood, congruent affect, judgement and insight appear normal   Data Reviewed:  Notable labs: BUN 27, creatinine 1.14 from 1.21, calcium 8.4, GFR 50, hemoglobin 11.8 .  Preliminary urine  culture growing E. coli with susceptibilities pending  Family Communication: None  Disposition: Status is: Inpatient Remains inpatient appropriate because: Remains on empiric IV antibiotics pending urine culture and further clinical improvement     Planned Discharge Destination: Skilled nursing facility    Time spent: 40 minutes  Author: Pennie Banter, DO 03/03/2022 3:41 PM  For on call review [email or web address].

## 2022-03-03 NOTE — Assessment & Plan Note (Addendum)
As outlined  

## 2022-03-03 NOTE — Assessment & Plan Note (Signed)
No acute issues. Continue aspirin and statin

## 2022-03-04 DIAGNOSIS — N1831 Chronic kidney disease, stage 3a: Secondary | ICD-10-CM

## 2022-03-04 DIAGNOSIS — N179 Acute kidney failure, unspecified: Secondary | ICD-10-CM

## 2022-03-04 DIAGNOSIS — I5022 Chronic systolic (congestive) heart failure: Secondary | ICD-10-CM | POA: Diagnosis not present

## 2022-03-04 DIAGNOSIS — R531 Weakness: Secondary | ICD-10-CM | POA: Diagnosis not present

## 2022-03-04 DIAGNOSIS — N390 Urinary tract infection, site not specified: Secondary | ICD-10-CM | POA: Diagnosis not present

## 2022-03-04 LAB — URINE CULTURE: Culture: >=100000 — AB

## 2022-03-04 LAB — BASIC METABOLIC PANEL
Anion gap: 10 (ref 5–15)
BUN: 26 mg/dL — ABNORMAL HIGH (ref 8–23)
CO2: 29 mmol/L (ref 22–32)
Calcium: 9 mg/dL (ref 8.9–10.3)
Chloride: 102 mmol/L (ref 98–111)
Creatinine, Ser: 1.4 mg/dL — ABNORMAL HIGH (ref 0.44–1.00)
GFR, Estimated: 39 mL/min — ABNORMAL LOW (ref >60)
Glucose, Bld: 94 mg/dL (ref 70–99)
Potassium: 3.5 mmol/L (ref 3.5–5.1)
Sodium: 141 mmol/L (ref 135–145)

## 2022-03-04 MED ORDER — SENNOSIDES-DOCUSATE SODIUM 8.6-50 MG PO TABS
1.0000 | ORAL_TABLET | Freq: Two times a day (BID) | ORAL | Status: DC
  Administered 2022-03-04 – 2022-03-06 (×4): 1 via ORAL
  Filled 2022-03-04 (×5): qty 1

## 2022-03-04 MED ORDER — BUTALBITAL-APAP-CAFFEINE 50-325-40 MG PO TABS: 1.0000 | ORAL_TABLET | Freq: Four times a day (QID) | ORAL | Status: DC | PRN

## 2022-03-04 MED ORDER — AMOXICILLIN 500 MG PO CAPS
500.0000 mg | ORAL_CAPSULE | Freq: Three times a day (TID) | ORAL | Status: DC
  Administered 2022-03-04 – 2022-03-06 (×6): 500 mg via ORAL
  Filled 2022-03-04 (×7): qty 1

## 2022-03-04 MED ORDER — POTASSIUM CHLORIDE CRYS ER 20 MEQ PO TBCR
40.0000 meq | EXTENDED_RELEASE_TABLET | Freq: Once | ORAL | Status: AC
Start: 2022-03-04 — End: 2022-03-04
  Administered 2022-03-04: 40 meq via ORAL
  Filled 2022-03-04: qty 2

## 2022-03-04 NOTE — TOC Initial Note (Addendum)
Transition of Care [facility]) - Initial/Assessment Note    Patient Details  Name: Kimberly Cook MRN: [id number] Date of Birth: 07/31/1946  Transition of Care [facility]) CM/SW Contact:    Gildardo Griffes, LCSW Phone Number: 03/04/2022, 10:34 AM  Clinical Narrative:                  Patient is a LTC resident at [facility], CSW has informed admissions Gavin Pound at [facility] that patient will return tomorrow, aware patient will return as LTC resident. Per Gavin Pound at [facility] patient will go to room 216 A.    Expected Discharge Plan: Skilled Nursing Facility Barriers to Discharge: Continued Medical Work up   Patient Goals and CMS Choice Patient states their goals for this hospitalization and ongoing recovery are:: to go home CMS Medicare.gov Compare Post Acute Care list provided to:: Patient Choice offered to / list presented to : Patient  Expected Discharge Plan and Services Expected Discharge Plan: Skilled Nursing Facility       Living arrangements for the past 2 months: Skilled Nursing Facility                                      Prior Living Arrangements/Services Living arrangements for the past 2 months: Skilled Nursing Facility Lives with:: Facility Resident                   Activities of Daily Living Home Assistive Devices/Equipment: Environmental consultant (specify type) ADL Screening (condition at time of admission) Patient's cognitive ability adequate to safely complete daily activities?: Yes Is the patient deaf or have difficulty hearing?: No Does the patient have difficulty seeing, even when wearing glasses/contacts?: No Does the patient have difficulty concentrating, remembering, or making decisions?: No Patient able to express need for assistance with ADLs?: No Does the patient have difficulty dressing or bathing?: Yes Independently performs ADLs?: No Does the patient have difficulty walking or climbing stairs?: No Weakness of Legs: Both Weakness of Arms/Hands:  None  Permission Sought/Granted                  Emotional Assessment              Admission diagnosis:  Altered mental status [R41.82] Weakness [R53.1] ESBL (extended spectrum beta-lactamase) producing bacteria infection [A49.9, Z16.12] Cystitis [N30.90] UTI (urinary tract infection) [N39.0] Patient Active Problem List   Diagnosis Date Noted   Acute renal failure superimposed on stage 3a chronic kidney disease (HCC) 03/04/2022   UTI (urinary tract infection) 03/03/2022   Altered mental status 03/02/2022   Obesity (BMI 30-39.9) 03/02/2022   Exercise intolerance 03/02/2022   Left leg numbness 03/02/2022   Pericardial effusion 01/04/2022   History of stroke 08/29/2021   Neuropathy 08/29/2021   NICM (nonischemic cardiomyopathy) (HCC)    Weakness    Bilateral pleural effusion    Chest pain    Pancytopenia (HCC)    Chronic HFrEF (heart failure with reduced ejection fraction) (HCC)    Malnutrition of moderate degree 04/03/2021   Metabolic encephalopathy 03/31/2021   Failed spinal cord stimulator, subsequent encounter 08/08/2020   S/P insertion of spinal cord stimulator 04/25/2020   Lumbar radiculopathy 12/07/2019   Lumbar facet arthropathy 12/07/2019   Spinal stenosis, lumbar region, with neurogenic claudication 12/07/2019   Osteoporosis 11/24/2019   Lumbar herniated disc 06/09/2019   Lumbar degenerative disc disease 06/09/2019   Spinal stenosis of cervical region 05/07/2019  Lumbar spondylosis 05/07/2019   Recurrent UTI 05/07/2019   Abnormal MRI, lumbar spine 02/18/2019   Abnormal gait 02/18/2019   Chronic back pain 02/18/2019   Osteoarthritis 10/09/2018   Overactive bladder 10/09/2018   Chronic midline low back pain 04/22/2018   Chronic pain of left knee 04/22/2018   Toe fracture, left 03/10/2018   Lightheadedness 01/21/2018   PAC (premature atrial contraction) 01/19/2018   PVC's (premature ventricular contractions) 01/19/2018   Hernia, paraesophageal  12/16/2017   Kidney cysts 12/16/2017   CKD Stage IIIa 12/16/2017   Carotid artery stenosis 12/16/2017   Chronic pain 12/16/2017   Osteopenia 12/16/2017   DDD (degenerative disc disease), cervical 12/16/2017   Labile hypertension 09/05/2017   Anxiety and depression 08/27/2017   Insomnia 08/27/2017   Syncope 08/27/2017   Bilateral occipital neuralgia 08/27/2017   Lung nodule 06/30/2017   Coronary artery disease involving native coronary artery of native heart without angina pectoris 06/03/2017   SOB (shortness of breath) 04/11/2017   Labile blood pressure 03/08/2017   Syncope, near 03/08/2017   Palpitations 03/08/2017   Orthostatic lightheadedness 03/08/2017   Essential hypertension    S/P TKR (total knee replacement) 04/17/2015   Morbid obesity (HCC) 09/21/2014   Medication management 09/21/2014   Hypothyroidism 02/17/2014   Hyperlipidemia    GERD (gastroesophageal reflux disease)    Vitamin D deficiency    Depression    PCP:  System, Provider Not In Pharmacy:   [facility] - [place], [place] - [address] [place] [address] Phone: [phone] Fax: [phone]  [facility] - [place], [place] - [address] [place] [address] Phone: [phone] Fax: [phone]     Social Determinants of Health (SDOH) Interventions    Readmission Risk Interventions     No data to display

## 2022-03-04 NOTE — Assessment & Plan Note (Signed)
Baseline Cr around 1.0-1.1. Fairly stable renal function but Cr bumped to 1.40 today.  See AKI.

## 2022-03-04 NOTE — Assessment & Plan Note (Signed)
Volume status is stable at this time. Last Echo - EF 45-50%. --Continue torsemide, Coreg --Monitor volume status closely --Outpatient cardiology follow up

## 2022-03-04 NOTE — NC FL2 (Signed)
[place] MEDICAID FL2 LEVEL OF CARE SCREENING TOOL     IDENTIFICATION  Patient Name: Kimberly Cook Birthdate: Mar 01, 1946 Sex: female Admission Date (Current Location): 03/02/2022  [facility] and [place] Number:  Chiropodist and Address:  [facility], [address] [place], [place] [address]      Provider Number: [id number]  Attending Physician Name and Address:  Pennie Banter, DO  Relative Name and Phone Number:  Gene (son) [phone]    Current Level of Care: Hospital Recommended Level of Care: Skilled Nursing Facility Prior Approval Number:    Date Approved/Denied:   PASRR Number: [id number]  Discharge Plan: SNF    Current Diagnoses: Patient Active Problem List   Diagnosis Date Noted   Acute renal failure superimposed on stage 3a chronic kidney disease (HCC) 03/04/2022   UTI (urinary tract infection) 03/03/2022   Altered mental status 03/02/2022   Obesity (BMI 30-39.9) 03/02/2022   Exercise intolerance 03/02/2022   Left leg numbness 03/02/2022   Pericardial effusion 01/04/2022   History of stroke 08/29/2021   Neuropathy 08/29/2021   NICM (nonischemic cardiomyopathy) (HCC)    Weakness    Bilateral pleural effusion    Chest pain    Pancytopenia (HCC)    Chronic HFrEF (heart failure with reduced ejection fraction) (HCC)    Malnutrition of moderate degree 04/03/2021   Metabolic encephalopathy 03/31/2021   Failed spinal cord stimulator, subsequent encounter 08/08/2020   S/P insertion of spinal cord stimulator 04/25/2020   Lumbar radiculopathy 12/07/2019   Lumbar facet arthropathy 12/07/2019   Spinal stenosis, lumbar region, with neurogenic claudication 12/07/2019   Osteoporosis 11/24/2019   Lumbar herniated disc 06/09/2019   Lumbar degenerative disc disease 06/09/2019   Spinal stenosis of cervical region 05/07/2019   Lumbar spondylosis 05/07/2019   Recurrent UTI 05/07/2019   Abnormal MRI, lumbar spine 02/18/2019    Abnormal gait 02/18/2019   Chronic back pain 02/18/2019   Osteoarthritis 10/09/2018   Overactive bladder 10/09/2018   Chronic midline low back pain 04/22/2018   Chronic pain of left knee 04/22/2018   Toe fracture, left 03/10/2018   Lightheadedness 01/21/2018   PAC (premature atrial contraction) 01/19/2018   PVC's (premature ventricular contractions) 01/19/2018   Hernia, paraesophageal 12/16/2017   Kidney cysts 12/16/2017   CKD Stage IIIa 12/16/2017   Carotid artery stenosis 12/16/2017   Chronic pain 12/16/2017   Osteopenia 12/16/2017   DDD (degenerative disc disease), cervical 12/16/2017   Labile hypertension 09/05/2017   Anxiety and depression 08/27/2017   Insomnia 08/27/2017   Syncope 08/27/2017   Bilateral occipital neuralgia 08/27/2017   Lung nodule 06/30/2017   Coronary artery disease involving native coronary artery of native heart without angina pectoris 06/03/2017   SOB (shortness of breath) 04/11/2017   Labile blood pressure 03/08/2017   Syncope, near 03/08/2017   Palpitations 03/08/2017   Orthostatic lightheadedness 03/08/2017   Essential hypertension    S/P TKR (total knee replacement) 04/17/2015   Morbid obesity (HCC) 09/21/2014   Medication management 09/21/2014   Hypothyroidism 02/17/2014   Hyperlipidemia    GERD (gastroesophageal reflux disease)    Vitamin D deficiency    Depression     Orientation RESPIRATION BLADDER Height & Weight     Self, Time, Situation, Place  Normal Incontinent, External catheter Weight: 192 lb (87.1 kg) Height:  5\' 4"  (162.6 cm)  BEHAVIORAL SYMPTOMS/MOOD NEUROLOGICAL BOWEL NUTRITION STATUS      Continent Diet (see discharge summary)  AMBULATORY STATUS COMMUNICATION OF NEEDS Skin  Limited Assist Verbally Normal                       Personal Care Assistance Level of Assistance  Bathing, Dressing, Feeding Bathing Assistance: Limited assistance Feeding assistance: Limited assistance Dressing Assistance: Limited  assistance     Functional Limitations Info  Sight, Speech, Hearing Sight Info: Adequate Hearing Info: Adequate Speech Info: Adequate    SPECIAL CARE FACTORS FREQUENCY                       Contractures Contractures Info: Not present    Additional Factors Info  Code Status, Allergies Code Status Info: DNR Allergies Info: no known allergies           Current Medications (03/04/2022):  This is the current hospital active medication list Current Facility-Administered Medications  Medication Dose Route Frequency Provider Last Rate Last Admin   acetaminophen (TYLENOL) tablet 650 mg  650 mg Oral Q6H PRN Cox, Amy N, DO   650 mg at 03/03/22 2134   Or   acetaminophen (TYLENOL) suppository 650 mg  650 mg Rectal Q6H PRN Cox, Amy N, DO       amoxicillin (AMOXIL) capsule 500 mg  500 mg Oral Q8H Griffith, Kelly A, DO       aspirin EC tablet 81 mg  81 mg Oral QHS Cox, Amy N, DO   81 mg at 03/03/22 2133   atorvastatin (LIPITOR) tablet 20 mg  20 mg Oral QHS Cox, Amy N, DO   20 mg at 03/03/22 2133   carvedilol (COREG) tablet 3.125 mg  3.125 mg Oral BID WC Cox, Amy N, DO   3.125 mg at 03/04/22 0957   citalopram (CELEXA) tablet 20 mg  20 mg Oral Daily Cox, Amy N, DO   20 mg at 03/04/22 [id number]   cyanocobalamin (VITAMIN B12) tablet 1,000 mcg  1,000 mcg Oral QPM Cox, Amy N, DO   1,000 mcg at 03/03/22 1640   enoxaparin (LOVENOX) injection 40 mg  40 mg Subcutaneous QHS Dorothe Pea, RPH   40 mg at [id number]   folic acid (FOLVITE) tablet 0.5 mg  500 mcg Oral QPM Cox, Amy N, DO   0.5 mg at 03/03/22 1639   hydrALAZINE (APRESOLINE) injection 5 mg  5 mg Intravenous Q6H PRN Cox, Amy N, DO       levothyroxine (SYNTHROID) tablet 88 mcg  88 mcg Oral Once per day on Mon Tue Wed Thu Fri Sat Cox, Amy N, DO   88 mcg at 03/04/22 0546   lidocaine (LIDODERM) 5 % 1 patch  1 patch Transdermal Daily PRN Cox, Amy N, DO       losartan (COZAAR) tablet 12.5 mg  12.5 mg Oral Daily Cox, Amy N, DO   12.5 mg at  03/04/22 0956   melatonin tablet 5 mg  5 mg Oral QHS PRN Cox, Amy N, DO       melatonin tablet 5 mg  5 mg Oral QHS Cox, Amy N, DO   5 mg at 03/03/22 2133   montelukast (SINGULAIR) tablet 10 mg  10 mg Oral QHS Cox, Amy N, DO   10 mg at 03/03/22 2133   multivitamin with minerals tablet 1 tablet  1 tablet Oral QPM Cox, Amy N, DO   1 tablet at 03/03/22 1640   ondansetron (ZOFRAN) tablet 4 mg  4 mg Oral Q6H PRN Cox, Amy N, DO  Or   ondansetron (ZOFRAN) injection 4 mg  4 mg Intravenous Q6H PRN Cox, Amy N, DO   4 mg at 03/02/22 1951   pantoprazole (PROTONIX) EC tablet 80 mg  80 mg Oral Daily Cox, Amy N, DO   80 mg at 03/04/22 0953   polyethylene glycol (MIRALAX / GLYCOLAX) packet 17 g  17 g Oral Daily PRN Cox, Amy N, DO       potassium chloride SA (KLOR-CON M) CR tablet 40 mEq  40 mEq Oral Once [place] Grandchild A, DO       pregabalin (LYRICA) capsule 75 mg  75 mg Oral TID Cox, Amy N, DO   75 mg at 03/04/22 3419   senna-docusate (Senokot-S) tablet 1 tablet  1 tablet Oral QHS PRN Cox, Amy N, DO   1 tablet at 03/02/22 2035   torsemide (DEMADEX) tablet 40 mg  40 mg Oral Daily Cox, Amy N, DO   40 mg at 03/04/22 0953   traMADol (ULTRAM) tablet 50 mg  50 mg Oral Q6H PRN Cox, Amy N, DO       traZODone (DESYREL) tablet 100 mg  100 mg Oral QHS PRN Cox, Amy N, DO   100 mg at 03/03/22 2133     Discharge Medications: Please see discharge summary for a list of discharge medications.  Relevant Imaging Results:  Relevant Lab Results:   Additional Information SSN:[id number]  Gildardo Griffes, LCSW

## 2022-03-04 NOTE — Assessment & Plan Note (Signed)
8/1 - Cr improved and at baseline 1.40 >> 1.10.  Baseline CKD stage 3a (Cr typically ~1.0-1.1) --Encourage PO hydration --Monitor BMP

## 2022-03-04 NOTE — Progress Notes (Signed)
Progress Note   Patient: Kimberly Cook DOB: [birth year] DOA: 03/02/2022     1 DOS: the patient was seen and examined on 03/04/2022   Brief hospital course: [name] is a 76 year old female with history of hypertension, anxiety, depression, hypothyroid, GERD, insomnia, who presented to the ED on 03/02/2022 [facility] for evaluation of confusion.  In the ED, vitals were stable aside from heart rate in the upper 50s..  Labs notable for creatinine 1.21 disease baseline appears around 1.0-1.1), mild thrombocytopenia 133, BUN 29, vitamin B12 elevated.  No fever or leukocytosis.  Urinalysis with evidence of UTI (large leukocytes, positive nitrite, few bacteria, greater than 50 WBCs).  Patient has prior history of ESBL UTIs and was treated in the ED with meropenem.  Admitted to the hospital and continued on Zosyn based on prior urine culture results.       Assessment and Plan: * Altered mental status POA, most likely due to UTI and potentially medications (Xanax) - Hold home Xanax for now - Treat UTI as outlined -- Delirium precautions -- Avoid sedating medications  Recurrent UTI POA, urinalysis was consistent with infection (large leukocytes, positive nitrite, few bacteria, greater than 50 WBCs.  History of ESBL E. coli UTIs sensitive to Zosyn, Macrobid, imipenem, gentamicin, Unasyn. Treated with meropenem in the ED. Treated with empiric Zosyn until urine cultures resulted with pan-sensitive E. Coli. --Transition to PO amoxicillin to complete course.   Weakness Generalized, likely due to UTI in addition to residual since her spine surgery and hospital admission. -- PT OT evaluations -- SNF recommended Pt in LTC at [facility], requests SNF/rehab there when she returns -- Fall precautions -- Mobilize as much as tolerated -- Out of bed to chair   Acute renal failure superimposed on stage 3a chronic kidney disease (HCC) 7/31 - Cr 1.40 up from 1.14 yesterday.   Baseline CKD stage 3a (Cr typically ~1.0-1.1) --Encourage PO hydration --Monitor BMP --Already rec'd torsemide today, hold tomorrow if Cr not improved  UTI (urinary tract infection) As outlined  Left leg numbness Chronic, patient reports this is baseline since her spinal surgery -- Continue home gabapentin  Exercise intolerance Mobilize as much as tolerated, out of bed to chair at least each shift  Obesity (BMI 30-39.9) Body mass index is 32.96 kg/m. Complicates overall care and prognosis.  Recommend lifestyle modifications including physical activity and diet for weight loss and overall long-term health.   History of stroke No acute issues. Continue aspirin and statin  Chronic HFrEF (heart failure with reduced ejection fraction) (HCC) Volume status is stable at this time. Last Echo - EF 45-50%. --Continue torsemide, Coreg --Hold torsemide tomorrow if Cr rising --Monitor volume status closely --Outpatient cardiology follow up  CKD Stage IIIa Baseline Cr around 1.0-1.1. Fairly stable renal function but Cr bumped to 1.40 today.  See AKI.  Anxiety and depression Continue home Celexa and trazodone  Coronary artery disease involving native coronary artery of native heart without angina pectoris Stable, no active chest pain. Continue ASA, Lipitor, Coreg  Essential hypertension Continued on home Coreg, losartan and torsemide. IV hydralazine as needed  Hypothyroidism Continue levothyroxine  GERD (gastroesophageal reflux disease) Continue PPI  Hyperlipidemia Continue statin        Subjective: Patient was sitting up in recliner when seen today.  Son at bedside.  Reports a mild headache this AM. Denies vision changes. No fever/chills but says she stays cold.  She wants to resume some rehab at [facility] when  she returns.  Says she hopes to regain some of her lost independence over past year, feels more weak than her usual.  Still has a great deal of pain from rods in  her back since surgery.    Physical Exam: Vitals:   03/04/22 0539 03/04/22 0914 03/04/22 0955 03/04/22 1601  BP: (!) 103/54 115/68  109/76  Pulse: 61 (!) 54 62 (!) 58  Resp: 16 18  17   Temp: 97.8 F (36.6 C) (!) 97.5 F (36.4 C)  98.1 F (36.7 C)  TempSrc:      SpO2: 96% 100%  (!) 81%  Weight:      Height:       General exam: awake, alert, no acute distress, obese HEENT: moist mucus membranes, hearing grossly normal  Respiratory system: CTAB with diminished bases, no wheezes, on 2 L/min [place] O2, normal respiratory effort. Cardiovascular system: normal S1/S2, RRR, no peripheral edema.   Gastrointestinal system: soft, NT, ND Central nervous system: A&O x4. no gross focal neurologic deficits, normal speech Extremities: moves all, no cyanosis, normal tone Skin: dry, intact, normal temperature Psychiatry: normal mood, congruent affect, judgement and insight appear normal   Data Reviewed: Notable labs -- Cr 1.40, K 3.5, GFR 39   Family Communication: None  Disposition: Status is: Inpatient Remains inpatient appropriate because: Severity of illness with worsening renal function.  Anticipate return to [facility] in 24-48 hours if improved.     Planned Discharge Destination: Skilled nursing facility    Time spent: 40 minutes  Author: [id number], DO 03/04/2022 5:26 PM  For on call review [email or web address].

## 2022-03-04 NOTE — Assessment & Plan Note (Signed)
Stable, no active chest pain. Continue ASA, Lipitor, Coreg

## 2022-03-04 NOTE — Evaluation (Signed)
Physical Therapy Evaluation Patient Details Name: Kimberly Cook MRN: [id number] DOB: Dec 09, 1945 Today's Date: 03/04/2022  History of Present Illness  Patient is a 76 year old female with history of hypertension, anxiety, depression, hypothyroid, GERD, insomnia, CHF who presents to the emergency department for chief concerns of confusion. Found to have UTI. Chonric L leg numbness and prior back surgery.  Clinical Impression  Patient agreeable to PT. She reports she lives at [facility] and ambulates unassisted with her rollator at baseline. She does need intermittent assistance with ADLs at the facility.  The patient reports she feels weaker now than usual and has not been out of bed. She needs assistance with bed mobility. Patient able to ambulate a short distance in the room with rolling walker with Min guard assistance. She was fatigued with activity with mild dyspnea with exertion. She could benefit from continued PT to maximize independence and facilitate return to prior level of function.      Recommendations for follow up therapy are one component of a multi-disciplinary discharge planning process, led by the attending physician.  Recommendations may be updated based on patient status, additional functional criteria and insurance authorization.  Follow Up Recommendations Skilled nursing-short term rehab (<3 hours/day) Can patient physically be transported by private vehicle: Yes    Assistance Recommended at Discharge Intermittent Supervision/Assistance  Patient can return home with the following  A little help with walking and/or transfers;A little help with bathing/dressing/bathroom;Help with stairs or ramp for entrance;Assist for transportation    Equipment Recommendations None recommended by PT  Recommendations for Other Services       Functional Status Assessment Patient has had a recent decline in their functional status and demonstrates the ability to make significant  improvements in function in a reasonable and predictable amount of time.     Precautions / Restrictions Precautions Precautions: Fall Restrictions Weight Bearing Restrictions: No      Mobility  Bed Mobility Overal bed mobility: Needs Assistance Bed Mobility: Supine to Sit, Sit to Supine     Supine to sit: Mod assist Sit to supine: Min guard   General bed mobility comments: cues for technique. increased time and effort required.    Transfers Overall transfer level: Needs assistance Equipment used: Rolling walker (2 wheels) Transfers: Sit to/from Stand Sit to Stand: Min guard           General transfer comment: cues for safety    Ambulation/Gait Ambulation/Gait assistance: Min guard Gait Distance (Feet): 30 Feet Assistive device: Rolling walker (2 wheels) Gait Pattern/deviations: Step-through pattern, Decreased stride length Gait velocity: decreased     General Gait Details: verbal cues for safety.  Stairs            Wheelchair Mobility    Modified Rankin (Stroke Patients Only)       Balance Overall balance assessment: Needs assistance Sitting-balance support: Feet supported Sitting balance-Leahy Scale: Good     Standing balance support: Bilateral upper extremity supported Standing balance-Leahy Scale: Fair Standing balance comment: with UE supported on rolling walker. Min guard for safety                             Pertinent Vitals/Pain Pain Assessment Pain Assessment: Faces Faces Pain Scale: Hurts a little bit Pain Location: head Pain Descriptors / Indicators: Headache    Home Living Family/patient expects to be discharged to:: Skilled nursing facility  Additional Comments: has been at [facility] for around a year. therapy on and off since sine surgery.    Prior Function Prior Level of Function : Needs assist             Mobility Comments: patient is independent with mobility,  ambulatory around the facility with a rollator ADLs Comments: needs assistance for wiping after toileting, lower body dressing, and putting on a bra.     Hand Dominance        Extremity/Trunk Assessment   Upper Extremity Assessment Upper Extremity Assessment: Overall WFL for tasks assessed    Lower Extremity Assessment Lower Extremity Assessment: Generalized weakness (endurance impaired for sustained activity. chronic LLE numbness)       Communication   Communication: No difficulties  Cognition Arousal/Alertness: Awake/alert Behavior During Therapy: WFL for tasks assessed/performed Overall Cognitive Status: Within Functional Limits for tasks assessed                                 General Comments: patient is able to follow all commands without difficulty        General Comments General comments (skin integrity, edema, etc.): no dizziness or increased pain  is reported with mobility efforts.    Exercises     Assessment/Plan    PT Assessment Patient needs continued PT services  PT Problem List Decreased strength;Decreased activity tolerance;Decreased balance;Decreased range of motion;Decreased mobility;Decreased cognition       PT Treatment Interventions DME instruction;Gait training;Functional mobility training;Therapeutic activities;Therapeutic exercise;Balance training;Neuromuscular re-education;Patient/family education    PT Goals (Current goals can be found in the Care Plan section)  Acute Rehab PT Goals Patient Stated Goal: to regain independence and have PT at the facility PT Goal Formulation: With patient Time For Goal Achievement: 03/18/22 Potential to Achieve Goals: Good    Frequency Min 2X/week     Co-evaluation               AM-PAC PT "6 Clicks" Mobility  Outcome Measure Help needed turning from your back to your side while in a flat bed without using bedrails?: A Little Help needed moving from lying on your back to sitting  on the side of a flat bed without using bedrails?: A Lot Help needed moving to and from a bed to a chair (including a wheelchair)?: A Little Help needed standing up from a chair using your arms (e.g., wheelchair or bedside chair)?: A Little Help needed to walk in hospital room?: A Little Help needed climbing 3-5 steps with a railing? : Total 6 Click Score: 15    End of Session   Activity Tolerance: Patient tolerated treatment well Patient left: in bed;with call bell/phone within reach;with bed alarm set Nurse Communication: Mobility status PT Visit Diagnosis: Unsteadiness on feet (R26.81);Muscle weakness (generalized) (M62.81)    Time: 3151-7616 PT Time Calculation (min) (ACUTE ONLY): 21 min   Charges:   PT Evaluation $PT Eval Low Complexity: 1 Low PT Treatments $Therapeutic Activity: 8-22 mins       Donna Bernard, PT, MPT   Ina Homes 03/04/2022, 3:55 PM

## 2022-03-05 DIAGNOSIS — G928 Other toxic encephalopathy: Secondary | ICD-10-CM

## 2022-03-05 LAB — BASIC METABOLIC PANEL
Anion gap: 8 (ref 5–15)
BUN: 27 mg/dL — ABNORMAL HIGH (ref 8–23)
CO2: 28 mmol/L (ref 22–32)
Calcium: 9.2 mg/dL (ref 8.9–10.3)
Chloride: 101 mmol/L (ref 98–111)
Creatinine, Ser: 1.1 mg/dL — ABNORMAL HIGH (ref 0.44–1.00)
GFR, Estimated: 52 mL/min — ABNORMAL LOW (ref >60)
Glucose, Bld: 105 mg/dL — ABNORMAL HIGH (ref 70–99)
Potassium: 3.5 mmol/L (ref 3.5–5.1)
Sodium: 137 mmol/L (ref 135–145)

## 2022-03-05 LAB — MAGNESIUM: Magnesium: 2.2 mg/dL (ref 1.7–2.4)

## 2022-03-05 LAB — TSH: TSH: 7.417 u[IU]/mL — ABNORMAL HIGH (ref 0.350–4.500)

## 2022-03-05 LAB — T4, FREE: Free T4: 0.99 ng/dL (ref 0.61–1.12)

## 2022-03-05 NOTE — Progress Notes (Signed)
Physical Therapy Treatment Patient Details Name: Kimberly Cook MRN: [id number] DOB: 02-15-1946 Today's Date: 03/05/2022   History of Present Illness Patient is a 76 year old female with history of hypertension, anxiety, depression, hypothyroid, GERD, insomnia, CHF who presents to the emergency department for chief concerns of confusion. Found to have UTI. Chonric L leg numbness and prior back surgery.   PT Comments    The patient is cooperative during session. Increased activity tolerance this session. Patient ambulated in hallway with rolling walker. Min A required for bed mobility and transfers. She feels that she is not back to her baseline level of functional mobility and reports having continued generalized weakness. Recommend to continue PT to maximize independence and facilitate return to prior level of function.    Recommendations for follow up therapy are one component of a multi-disciplinary discharge planning process, led by the attending physician.  Recommendations may be updated based on patient status, additional functional criteria and insurance authorization.  Follow Up Recommendations  Skilled nursing-short term rehab (<3 hours/day) Can patient physically be transported by private vehicle: Yes   Assistance Recommended at Discharge Intermittent Supervision/Assistance  Patient can return home with the following A little help with walking and/or transfers;A little help with bathing/dressing/bathroom;Help with stairs or ramp for entrance;Assist for transportation   Equipment Recommendations  None recommended by PT    Recommendations for Other Services       Precautions / Restrictions Precautions Precautions: Fall Restrictions Weight Bearing Restrictions: No     Mobility  Bed Mobility Overal bed mobility: Needs Assistance Bed Mobility: Supine to Sit, Sit to Supine     Supine to sit: Min assist Sit to supine: Min assist   General bed mobility comments:  intermittent assistance due to posterior lean and trunk support required.    Transfers Overall transfer level: Needs assistance Equipment used: Rolling walker (2 wheels) Transfers: Sit to/from Stand Sit to Stand: Min guard, Min assist           General transfer comment: Min A for lifting assistance to stand from bed and Min guard assistance provided from toilet.    Ambulation/Gait Ambulation/Gait assistance: Min guard Gait Distance (Feet): 90 Feet Assistive device: Rolling walker (2 wheels) Gait Pattern/deviations: Step-through pattern, Decreased stride length Gait velocity: decreased     General Gait Details: occasional cues for safety and education on importance of energy conservation. patient reports feeling generalized weakness with mobility today.   Stairs             Wheelchair Mobility    Modified Rankin (Stroke Patients Only)       Balance Overall balance assessment: Needs assistance Sitting-balance support: Feet supported Sitting balance-Leahy Scale: Fair Sitting balance - Comments: initial posterior lean in sitting that improved with feet supported on the floor Postural control: Posterior lean Standing balance support: Bilateral upper extremity supported Standing balance-Leahy Scale: Fair Standing balance comment: with UE supported on rolling walker                            Cognition Arousal/Alertness: Awake/alert Behavior During Therapy: WFL for tasks assessed/performed Overall Cognitive Status: Within Functional Limits for tasks assessed                                 General Comments: patient following all commands without difficulty        Exercises  General Comments General comments (skin integrity, edema, etc.): patient required maximal assistance for wiping after bowel movement (she reports she required assistance with this at baseline given minimal trunk range of motion following back surgery)       Pertinent Vitals/Pain Pain Assessment Pain Assessment: 0-10 Faces Pain Scale: Hurts a little bit Pain Location: head Pain Descriptors / Indicators: Headache Pain Intervention(s): Monitored during session    Home Living Family/patient expects to be discharged to:: Skilled nursing facility                   Additional Comments: has been at [facility] for around a year. therapy on and off since spine surgery.    Prior Function            PT Goals (current goals can now be found in the care plan section) Acute Rehab PT Goals Patient Stated Goal: to regain independence and have PT at the facility PT Goal Formulation: With patient Time For Goal Achievement: 03/18/22 Potential to Achieve Goals: Good Progress towards PT goals: Progressing toward goals    Frequency    Min 2X/week      PT Plan Current plan remains appropriate    Co-evaluation              AM-PAC PT "6 Clicks" Mobility   Outcome Measure  Help needed turning from your back to your side while in a flat bed without using bedrails?: A Little Help needed moving from lying on your back to sitting on the side of a flat bed without using bedrails?: A Lot Help needed moving to and from a bed to a chair (including a wheelchair)?: A Little Help needed standing up from a chair using your arms (e.g., wheelchair or bedside chair)?: A Little Help needed to walk in hospital room?: A Little Help needed climbing 3-5 steps with a railing? : Total 6 Click Score: 15    End of Session Equipment Utilized During Treatment: Gait belt Activity Tolerance: Patient tolerated treatment well Patient left: in bed;with call bell/phone within reach;with bed alarm set   PT Visit Diagnosis: Unsteadiness on feet (R26.81);Muscle weakness (generalized) (M62.81)     Time: 7824-2353 PT Time Calculation (min) (ACUTE ONLY): 24 min  Charges:  $Therapeutic Activity: 23-37 mins           Donna Bernard, PT,  MPT    Ina Homes 03/05/2022, 11:45 AM

## 2022-03-05 NOTE — Progress Notes (Signed)
Progress Note   Patient: Kimberly Cook DOB: Sep 29, 1945 DOA: 03/02/2022     2 DOS: the patient was seen and examined on 03/05/2022   Brief hospital course: Kimberly Cook is a 76 year old female with history of hypertension, anxiety, depression, hypothyroid, GERD, insomnia, who presented to the ED on 03/02/2022 [facility] for evaluation of confusion.  In the ED, vitals were stable aside from heart rate in the upper 50s..  Labs notable for creatinine 1.21 disease baseline appears around 1.0-1.1), mild thrombocytopenia 133, BUN 29, vitamin B12 elevated.  No fever or leukocytosis.  Urinalysis with evidence of UTI (large leukocytes, positive nitrite, few bacteria, greater than 50 WBCs).  Patient has prior history of ESBL UTIs and was treated in the ED with meropenem.  Admitted to the hospital and continued on Zosyn based on prior urine culture results.       Assessment and Plan: * Toxic metabolic encephalopathy Due to UTI and potentially medications (Xanax). Resolved with treating for UTI. - Treat UTI as outlined -- Delirium precautions - Resume home Xanax BID PRN & montior (held on admission)  Recurrent UTI POA, urinalysis was consistent with infection (large leukocytes, positive nitrite, few bacteria, greater than 50 WBCs.  History of ESBL E. coli UTIs sensitive to Zosyn, Macrobid, imipenem, gentamicin, Unasyn. Treated with meropenem in the ED. Treated with empiric Zosyn until urine cultures resulted with pan-sensitive E. Coli. --Transition to PO amoxicillin to complete course.   Weakness Generalized, likely due to UTI in addition to residual since her spine surgery and hospital admission. -- PT OT evaluations -- SNF recommended Pt in LTC at [facility], requests SNF/rehab there when she returns -- Fall precautions -- Mobilize as much as tolerated -- Out of bed to chair   Acute renal failure superimposed on stage 3a chronic kidney disease (HCC) 8/1 - Cr  improved and at baseline 1.40 >> 1.10.  Baseline CKD stage 3a (Cr typically ~1.0-1.1) --Encourage PO hydration --Monitor BMP  UTI (urinary tract infection) As outlined  Left leg numbness Chronic, patient reports this is baseline since her spinal surgery -- Continue home gabapentin  Exercise intolerance Mobilize as much as tolerated, out of bed to chair at least each shift  Obesity (BMI 30-39.9) Body mass index is 32.96 kg/m. Complicates overall care and prognosis.  Recommend lifestyle modifications including physical activity and diet for weight loss and overall long-term health.   History of stroke No acute issues. Continue aspirin and statin  Chronic HFrEF (heart failure with reduced ejection fraction) (HCC) Volume status is stable at this time. Last Echo - EF 45-50%. --Continue torsemide, Coreg --Monitor volume status closely --Outpatient cardiology follow up  CKD Stage IIIa Baseline Cr around 1.0-1.1. Fairly stable renal function but Cr bumped to 1.40 today.  See AKI.  Anxiety and depression Continue home Celexa and trazodone  Coronary artery disease involving native coronary artery of native heart without angina pectoris Stable, no active chest pain. Continue ASA, Lipitor, Coreg  Essential hypertension Continued on home Coreg, losartan and torsemide. IV hydralazine as needed  Hypothyroidism Continue levothyroxine  GERD (gastroesophageal reflux disease) Continue PPI  Hyperlipidemia Continue statin        Subjective: Patient awake sitting up in bed when seen today.  Reports feeling better.  No acute complaints including fever/chills, N/V, dysuria.    Physical Exam: Vitals:   03/04/22 2022 03/05/22 0523 03/05/22 0904 03/05/22 1609  BP: 109/79 118/63 106/71 127/86  Pulse: 60 70 65 72  Resp:  18 17  Temp: 98.2 F (36.8 C) 98.1 F (36.7 C) 98.2 F (36.8 C) 99 F (37.2 C)  TempSrc:      SpO2: 100% 98% 96% 98%  Weight:      Height:        General exam: awake, alert, no acute distress, obese HEENT: moist mucus membranes, hearing grossly normal  Respiratory system: CTAB with diminished bases, no wheezes, on 2 L/min [place] O2, normal respiratory effort. Cardiovascular system: normal S1/S2, RRR, no peripheral edema.   Gastrointestinal system: soft, NT, ND Central nervous system: A&O x4. no gross focal neurologic deficits, normal speech Extremities: moves all, no cyanosis, normal tone Skin: dry, intact, normal temperature Psychiatry: normal mood, congruent affect, judgement and insight appear normal   Data Reviewed: Notable labs -- Cr improved 1.40 >> 1.10 baseline, K 3.5, GFR 52, glucose 105   Family Communication: None  Disposition: Status is: Inpatient Remains inpatient appropriate because: Medically stable for d/c.  Auth pending for SNF.  Return to [facility] 8/1 if auth received     Planned Discharge Destination: Skilled nursing facility    Time spent: 35 minutes  Author: Pennie Banter, DO 03/05/2022 8:18 PM  For on call review [email or web address].

## 2022-03-05 NOTE — Evaluation (Signed)
Occupational Therapy Evaluation Patient Details Name: Kimberly Cook MRN: [id number] DOB: 1946-08-02 Today's Date: 03/05/2022   History of Present Illness Patient is a 76 year old female with history of hypertension, anxiety, depression, hypothyroid, GERD, insomnia, CHF who presents to the emergency department for chief concerns of confusion. Found to have UTI. Chonric L leg numbness and prior back surgery.   Clinical Impression   Patient presenting with decreased Ind in self care, balance, functional mobility/transfers, endurance, and safety awareness. Patient reports living at home ~ 1 year ago and has been in SNF since spine surgery. Pt reports use of rollator for mobility and needing some assistance with self care tasks.  Patient is very pleasant and cooperative during assessment. Pt performing bed mobility with min A for trunk support. Pt stands with use of RW and min A for anterior weight shift. Pt takes several steps to sink and stands for ~ 10 minutes for grooming tasks with min guard- min A. Pt does display some posterior bias in standing at sink. She does self correct posture. She fatigues quickly and did not feel that she could ambulate further after standing for grooming tasks. Patient will benefit from acute OT to increase overall independence in the areas of ADLs, functional mobility, and safety awareness in order to safely discharge to next venue of care.      Recommendations for follow up therapy are one component of a multi-disciplinary discharge planning process, led by the attending physician.  Recommendations may be updated based on patient status, additional functional criteria and insurance authorization.   Follow Up Recommendations  Skilled nursing-short term rehab (<3 hours/day)    Assistance Recommended at Discharge Intermittent Supervision/Assistance  Patient can return home with the following A little help with walking and/or transfers;A little help with  bathing/dressing/bathroom;Help with stairs or ramp for entrance;Assist for transportation;Direct supervision/assist for medications management;Direct supervision/assist for financial management;Assistance with cooking/housework    Functional Status Assessment  Patient has had a recent decline in their functional status and demonstrates the ability to make significant improvements in function in a reasonable and predictable amount of time.  Equipment Recommendations  Other (comment) (defer to next venue of care)       Precautions / Restrictions Precautions Precautions: Fall Restrictions Weight Bearing Restrictions: No      Mobility Bed Mobility Overal bed mobility: Needs Assistance Bed Mobility: Supine to Sit, Sit to Supine     Supine to sit: Min assist Sit to supine: Min assist   General bed mobility comments: min cuing for technique and with increased time and effort    Transfers Overall transfer level: Needs assistance Equipment used: Rolling walker (2 wheels) Transfers: Sit to/from Stand Sit to Stand: Min guard, Min assist           General transfer comment: cues for safety and anterior weight shift      Balance Overall balance assessment: Needs assistance Sitting-balance support: Feet supported Sitting balance-Leahy Scale: Good     Standing balance support: Bilateral upper extremity supported, Reliant on assistive device for balance Standing balance-Leahy Scale: Fair                             ADL either performed or assessed with clinical judgement   ADL Overall ADL's : Needs assistance/impaired     Grooming: Wash/dry hands;Wash/dry face;Oral care;Standing;Minimal assistance  Toilet Transfer: Rolling walker (2 wheels);Minimal assistance Toilet Transfer Details (indicate cue type and reason): simulated transfer         Functional mobility during ADLs: Min guard;Minimal assistance;Rolling walker (2 wheels)        Vision Patient Visual Report: No change from baseline              Pertinent Vitals/Pain Pain Assessment Pain Assessment: Faces Faces Pain Scale: Hurts a little bit Pain Location: L LE Pain Descriptors / Indicators: Aching, Discomfort Pain Intervention(s): Monitored during session, Repositioned     Hand Dominance Right   Extremity/Trunk Assessment Upper Extremity Assessment Upper Extremity Assessment: Overall WFL for tasks assessed   Lower Extremity Assessment Lower Extremity Assessment: Generalized weakness       Communication Communication Communication: No difficulties   Cognition Arousal/Alertness: Awake/alert Behavior During Therapy: WFL for tasks assessed/performed Overall Cognitive Status: Within Functional Limits for tasks assessed                                 General Comments: patient is able to follow all commands without difficulty. Pt is pleasant and cooperative.                Home Living Family/patient expects to be discharged to:: Skilled nursing facility                                 Additional Comments: has been at [facility] for around a year. therapy on and off since spine surgery.      Prior Functioning/Environment Prior Level of Function : Needs assist             Mobility Comments: patient is independent with mobility, ambulatory around the facility with a rollator ADLs Comments: needs assistance for wiping after toileting, lower body dressing, and putting on a bra.        OT Problem List: Decreased strength;Pain;Decreased activity tolerance;Decreased safety awareness;Impaired balance (sitting and/or standing);Decreased knowledge of use of DME or AE;Decreased knowledge of precautions      OT Treatment/Interventions: Self-care/ADL training;Therapeutic exercise;Therapeutic activities;Energy conservation;DME and/or AE instruction;Patient/family education;Balance training;Manual therapy     OT Goals(Current goals can be found in the care plan section) Acute Rehab OT Goals Patient Stated Goal: to go home OT Goal Formulation: With patient Time For Goal Achievement: 03/19/22 Potential to Achieve Goals: Good ADL Goals Pt Will Perform Grooming: with modified independence;standing Pt Will Perform Lower Body Dressing: sit to/from stand;with supervision Pt Will Transfer to Toilet: with supervision;ambulating Pt Will Perform Toileting - Clothing Manipulation and hygiene: with supervision;sit to/from stand  OT Frequency: Min 2X/week       AM-PAC OT "6 Clicks" Daily Activity     Outcome Measure Help from another person eating meals?: None Help from another person taking care of personal grooming?: A Little Help from another person toileting, which includes using toliet, bedpan, or urinal?: A Little Help from another person bathing (including washing, rinsing, drying)?: A Lot Help from another person to put on and taking off regular upper body clothing?: A Little Help from another person to put on and taking off regular lower body clothing?: A Lot 6 Click Score: 17   End of Session Equipment Utilized During Treatment: Rolling walker (2 wheels) Nurse Communication: Mobility status  Activity Tolerance: Patient tolerated treatment well;Patient limited by fatigue Patient left: in bed;with call bell/phone  within reach;with bed alarm set  OT Visit Diagnosis: Unsteadiness on feet (R26.81);Muscle weakness (generalized) (M62.81)                Time: 6237-6283 OT Time Calculation (min): 22 min Charges:  OT General Charges $OT Visit: 1 Visit OT Evaluation $OT Eval Moderate Complexity: 1 Mod OT Treatments $Self Care/Home Management : 8-22 mins Jackquline Denmark, MS, OTR/L , CBIS ascom [phone]  03/05/22, 11:01 AM

## 2022-03-06 DIAGNOSIS — R531 Weakness: Secondary | ICD-10-CM | POA: Diagnosis not present

## 2022-03-06 DIAGNOSIS — K5900 Constipation, unspecified: Secondary | ICD-10-CM | POA: Diagnosis not present

## 2022-03-06 DIAGNOSIS — N179 Acute kidney failure, unspecified: Secondary | ICD-10-CM | POA: Diagnosis not present

## 2022-03-06 DIAGNOSIS — E669 Obesity, unspecified: Secondary | ICD-10-CM | POA: Diagnosis not present

## 2022-03-06 DIAGNOSIS — N1831 Chronic kidney disease, stage 3a: Secondary | ICD-10-CM | POA: Diagnosis not present

## 2022-03-06 DIAGNOSIS — R5381 Other malaise: Secondary | ICD-10-CM | POA: Diagnosis not present

## 2022-03-06 DIAGNOSIS — M1711 Unilateral primary osteoarthritis, right knee: Secondary | ICD-10-CM | POA: Diagnosis not present

## 2022-03-06 DIAGNOSIS — R200 Anesthesia of skin: Secondary | ICD-10-CM | POA: Diagnosis not present

## 2022-03-06 DIAGNOSIS — I739 Peripheral vascular disease, unspecified: Secondary | ICD-10-CM | POA: Diagnosis not present

## 2022-03-06 DIAGNOSIS — M2042 Other hammer toe(s) (acquired), left foot: Secondary | ICD-10-CM | POA: Diagnosis not present

## 2022-03-06 DIAGNOSIS — Z96652 Presence of left artificial knee joint: Secondary | ICD-10-CM | POA: Diagnosis not present

## 2022-03-06 DIAGNOSIS — E039 Hypothyroidism, unspecified: Secondary | ICD-10-CM | POA: Diagnosis not present

## 2022-03-06 DIAGNOSIS — I428 Other cardiomyopathies: Secondary | ICD-10-CM | POA: Diagnosis not present

## 2022-03-06 DIAGNOSIS — I10 Essential (primary) hypertension: Secondary | ICD-10-CM | POA: Diagnosis not present

## 2022-03-06 DIAGNOSIS — E539 Vitamin B deficiency, unspecified: Secondary | ICD-10-CM | POA: Diagnosis not present

## 2022-03-06 DIAGNOSIS — I639 Cerebral infarction, unspecified: Secondary | ICD-10-CM | POA: Diagnosis not present

## 2022-03-06 DIAGNOSIS — I5023 Acute on chronic systolic (congestive) heart failure: Secondary | ICD-10-CM | POA: Diagnosis not present

## 2022-03-06 DIAGNOSIS — Z79899 Other long term (current) drug therapy: Secondary | ICD-10-CM | POA: Diagnosis not present

## 2022-03-06 DIAGNOSIS — I3139 Other pericardial effusion (noninflammatory): Secondary | ICD-10-CM | POA: Diagnosis not present

## 2022-03-06 DIAGNOSIS — L603 Nail dystrophy: Secondary | ICD-10-CM | POA: Diagnosis not present

## 2022-03-06 DIAGNOSIS — F419 Anxiety disorder, unspecified: Secondary | ICD-10-CM | POA: Diagnosis not present

## 2022-03-06 DIAGNOSIS — F5101 Primary insomnia: Secondary | ICD-10-CM | POA: Diagnosis not present

## 2022-03-06 DIAGNOSIS — I5022 Chronic systolic (congestive) heart failure: Secondary | ICD-10-CM | POA: Diagnosis not present

## 2022-03-06 DIAGNOSIS — M6281 Muscle weakness (generalized): Secondary | ICD-10-CM | POA: Diagnosis not present

## 2022-03-06 DIAGNOSIS — I2510 Atherosclerotic heart disease of native coronary artery without angina pectoris: Secondary | ICD-10-CM | POA: Diagnosis not present

## 2022-03-06 DIAGNOSIS — M2041 Other hammer toe(s) (acquired), right foot: Secondary | ICD-10-CM | POA: Diagnosis not present

## 2022-03-06 DIAGNOSIS — G928 Other toxic encephalopathy: Secondary | ICD-10-CM | POA: Diagnosis not present

## 2022-03-06 DIAGNOSIS — N390 Urinary tract infection, site not specified: Secondary | ICD-10-CM | POA: Diagnosis not present

## 2022-03-06 DIAGNOSIS — M25562 Pain in left knee: Secondary | ICD-10-CM | POA: Diagnosis not present

## 2022-03-06 DIAGNOSIS — R29898 Other symptoms and signs involving the musculoskeletal system: Secondary | ICD-10-CM | POA: Diagnosis not present

## 2022-03-06 DIAGNOSIS — Z7401 Bed confinement status: Secondary | ICD-10-CM | POA: Diagnosis not present

## 2022-03-06 DIAGNOSIS — G4700 Insomnia, unspecified: Secondary | ICD-10-CM | POA: Diagnosis not present

## 2022-03-06 DIAGNOSIS — F32A Depression, unspecified: Secondary | ICD-10-CM | POA: Diagnosis not present

## 2022-03-06 DIAGNOSIS — I5020 Unspecified systolic (congestive) heart failure: Secondary | ICD-10-CM | POA: Diagnosis not present

## 2022-03-06 DIAGNOSIS — J9600 Acute respiratory failure, unspecified whether with hypoxia or hypercapnia: Secondary | ICD-10-CM | POA: Diagnosis not present

## 2022-03-06 DIAGNOSIS — E782 Mixed hyperlipidemia: Secondary | ICD-10-CM | POA: Diagnosis not present

## 2022-03-06 MED ORDER — LEVOTHYROXINE SODIUM 100 MCG PO TABS: 100.0000 ug | ORAL_TABLET | Freq: Every day | ORAL | 0 refills | Status: DC

## 2022-03-06 MED ORDER — LEVOTHYROXINE SODIUM 100 MCG PO TABS: 100.0000 ug | ORAL_TABLET | ORAL | Status: DC

## 2022-03-06 MED ORDER — AMOXICILLIN 500 MG PO CAPS: 500.0000 mg | ORAL_CAPSULE | Freq: Three times a day (TID) | ORAL | 0 refills | Status: AC

## 2022-03-06 MED ORDER — LIDOCAINE 4 % EX PTCH: 1.0000 | MEDICATED_PATCH | CUTANEOUS | Status: DC

## 2022-03-06 MED ORDER — ALPRAZOLAM 0.5 MG PO TABS: 0.5000 mg | ORAL_TABLET | Freq: Two times a day (BID) | ORAL | 0 refills | Status: DC | PRN

## 2022-03-06 MED ORDER — TRAMADOL HCL 50 MG PO TABS: 50.0000 mg | ORAL_TABLET | Freq: Four times a day (QID) | ORAL | 0 refills | Status: DC | PRN

## 2022-03-06 NOTE — Care Management (Signed)
[employer] Authorization number [id number] approved 08/02 through 03/08/2022

## 2022-03-06 NOTE — Progress Notes (Signed)
Report given to RN at [facility]. Reviewed AVS. Assisted patient to getting dressed in paper gown. No further needs. EMS called for transport.

## 2022-03-06 NOTE — TOC Progression Note (Signed)
Transition of Care [facility]) - Progression Note    Patient Details  Name: Kimberly Cook MRN: [id number] Date of Birth: August 23, 1945  [facility] CM/SW Contact  Caryn Section, RN Phone Number: 03/06/2022, 9:27 AM  Clinical Narrative:  Patient has authorization from [facility] to return to [facility] for skilled nursing.  Patient can transport today via Customer service manager.     Expected Discharge Plan: Skilled Nursing Facility Barriers to Discharge: Continued Medical Work up  Expected Discharge Plan and Services Expected Discharge Plan: Skilled Nursing Facility       Living arrangements for the past 2 months: Skilled Nursing Facility Expected Discharge Date: 03/06/22                                     Social Determinants of Health (SDOH) Interventions    Readmission Risk Interventions     No data to display

## 2022-03-06 NOTE — Progress Notes (Signed)
PT Cancellation Note  Patient Details Name: SARA KEYS MRN: [id number] DOB: 1946-02-04   Cancelled Treatment:    Reason Eval/Treat Not Completed:  (patient declined due to awaiting transportation to return to SNF. PT will continue with attempts as appropriate.)  Donna Bernard, PT, MPT  Ina Homes 03/06/2022, 10:19 AM

## 2022-03-06 NOTE — Care Management Important Message (Signed)
Important Message  Patient Details  Name: Kimberly Cook MRN: [id number] Date of Birth: 03-14-1946   Medicare Important Message Given:  Yes  I reviewed the Important Message from Medicare with the patient by phone [phone]) as she is in an isolation room. She is in agreement with her discharge and I wished her well and thanked her for her time.    [name] A Beniah Magnan 03/06/2022, 9:30 AM

## 2022-03-06 NOTE — Discharge Summary (Signed)
Physician Discharge Summary   Patient: Kimberly Cook MRN: [id number] DOB: [birth year]  Admit date:     03/02/2022  Discharge date: 03/06/22  Discharge Physician: Loletha Grayer   PCP: System, Provider Not In   Recommendations at discharge:   Follow-up with medical doctor at facility. Follow-up urology already scheduled  Discharge Diagnoses: Principal Problem:   Toxic metabolic encephalopathy Active Problems:   Recurrent UTI   Weakness   Hyperlipidemia   GERD (gastroesophageal reflux disease)   Hypothyroidism   Essential hypertension   Coronary artery disease involving native coronary artery of native heart without angina pectoris   Anxiety and depression   CKD Stage IIIa   Chronic HFrEF (heart failure with reduced ejection fraction) (HCC)   History of stroke   Obesity (BMI 30-39.9)   Exercise intolerance   Left leg numbness   UTI (urinary tract infection)   Acute renal failure superimposed on stage 3a chronic kidney disease [facility])    Hospital Course: Kimberly Cook is a 76 year old female with history of hypertension, anxiety, depression, hypothyroid, GERD, insomnia, who presented to the ED on 03/02/2022 [facility] for evaluation of confusion.  In the ED, vitals were stable aside from heart rate in the upper 50s..  Labs notable for creatinine 1.21 disease baseline appears around 1.0-1.1), mild thrombocytopenia 133, BUN 29, vitamin B12 elevated.  No fever or leukocytosis.  Urinalysis with evidence of UTI (large leukocytes, positive nitrite, few bacteria, greater than 50 WBCs).  Patient has prior history of ESBL UTIs and was treated in the ED with meropenem.  Pansensitive E. coli growing out of urine culture.  Patient initially on Zosyn.  Switched over to amoxicillin and will continue that for few more days upon discharge.  Patient's mental status much improved.   Assessment and Plan: * Toxic metabolic encephalopathy Due to UTI and potentially medications  (Xanax). Resolved with treating for UTI.   Recurrent UTI POA Treated with empiric Zosyn until urine cultures resulted with pan-sensitive E. Coli. --Transitioned to PO amoxicillin to complete course. --Follow-up with urology as outpatient   Weakness Generalized, likely due to UTI in addition to residual since her spine surgery and hospital admission. -- PT OT evaluations -- SNF recommended Pt in LTC at [facility], requests SNF/rehab there when she returns -- Fall precautions -- Mobilize as much as tolerated -- Out of bed to chair   Acute renal failure superimposed on stage 3a chronic kidney disease ([place]) 8/1 - Cr improved and at baseline 1.40 >> 1.10.  Baseline CKD stage 3a (Cr typically ~1.0-1.1) --Encourage PO hydration --Monitor BMP   Left leg numbness Chronic, patient reports this is baseline since her spinal surgery -- Continue home gabapentin  Exercise intolerance Mobilize as much as tolerated, out of bed to chair at least each shift  Obesity (BMI 30-39.9) Body mass index is 32.96 kg/m.   History of stroke No acute issues. Continue aspirin and statin  Chronic HFrEF (heart failure with reduced ejection fraction) (HCC) Volume status is stable at this time. Last Echo - EF 45-50%. --Continue torsemide, Coreg --Monitor volume status closely --Outpatient cardiology follow up  CKD Stage IIIa Baseline Cr around 1.0-1.1.  Anxiety and depression Continue home Celexa and trazodone  Coronary artery disease involving native coronary artery of native heart without angina pectoris Stable, no active chest pain. Continue ASA, Lipitor, Coreg  Essential hypertension Continued on home Coreg, losartan and torsemide.  Hypothyroidism Continue levothyroxine and increased dose with elevated TSH of 7.417  GERD (gastroesophageal  reflux disease) Continue PPI  Hyperlipidemia Continue statin         Consultants: None procedures performed: None Disposition: Skilled  nursing facility Diet recommendation:  Cardiac diet DISCHARGE MEDICATION: Allergies as of 03/06/2022   No Known Allergies      Medication List     TAKE these medications    acetaminophen 500 MG tablet Commonly known as: TYLENOL Take 1,000 mg by mouth every 8 (eight) hours as needed for moderate pain.   alendronate 70 MG tablet Commonly known as: FOSAMAX Take 70 mg by mouth once a week.   ALPRAZolam 0.5 MG tablet Commonly known as: XANAX Take 1 tablet (0.5 mg total) by mouth 2 (two) times daily as needed for anxiety.   amoxicillin 500 MG capsule Commonly known as: AMOXIL Take 1 capsule (500 mg total) by mouth every 8 (eight) hours for 3 days.   ascorbic acid 500 MG tablet Commonly known as: VITAMIN C Take 500 mg by mouth daily.   aspirin EC 81 MG tablet Take 1 tablet (81 mg total) by mouth at bedtime. Swallow whole.   atorvastatin 20 MG tablet Commonly known as: LIPITOR Take 1 tablet (20 mg total) by mouth daily.   Biotin 1 MG Caps Take 1 mg by mouth daily.   carvedilol 3.125 MG tablet Commonly known as: COREG Take 1 tablet (3.125 mg total) by mouth 2 (two) times daily with a meal. Hold for blood pressure less than 90/60   cetirizine 5 MG tablet Commonly known as: ZYRTEC Take 5 mg by mouth daily.   citalopram 20 MG tablet Commonly known as: CELEXA Take 20 mg by mouth daily.   Cranberry 450 MG Tabs Take 1 tablet by mouth every morning.   cyanocobalamin 1000 MCG tablet Commonly known as: VITAMIN B12 Take 1 tablet (1,000 mcg total) by mouth daily.   dapagliflozin propanediol 10 MG Tabs tablet Commonly known as: Farxiga Take 1 tablet (10 mg total) by mouth daily before breakfast.   folic acid 400 MCG tablet Commonly known as: FOLVITE Take 1 tablet (400 mcg total) by mouth daily. SEPARATE ALL SUPPLEMENTS TO LUNCH OR DINNER AND PRILOSEC NOT TO MESS W/THYROID MED   levothyroxine 100 MCG tablet Commonly known as: SYNTHROID Take 1 tablet (100 mcg total) by  mouth daily before breakfast. What changed:  medication strength how much to take additional instructions   lidocaine 4 % Place 1 patch onto the skin daily. Remove & Discard patch within 12 hours or as directed by MD   losartan 25 MG tablet Commonly known as: COZAAR Take 0.5 tablets (12.5 mg total) by mouth daily. Hold for Blood pressure less than 90/60   magnesium oxide 400 (240 Mg) MG tablet Commonly known as: MAG-OX Take 1 tablet (400 mg total) by mouth daily.   melatonin 5 MG Tabs Take 1 tablet (5 mg total) by mouth at bedtime.   montelukast 10 MG tablet Commonly known as: SINGULAIR Take 1 tablet (10 mg total) by mouth at bedtime.   multivitamin with minerals Tabs tablet Take 1 tablet by mouth daily.   omeprazole 40 MG capsule Commonly known as: PRILOSEC Take 1 capsule (40 mg total) by mouth daily. After lunch   polyethylene glycol powder 17 GM/SCOOP powder Commonly known as: GLYCOLAX/MIRALAX Take 17 g by mouth daily as needed for moderate constipation.   potassium chloride SA 20 MEQ tablet Commonly known as: KLOR-CON M Take 1 tablet (20 mEq total) by mouth 2 (two) times daily.   pregabalin 75 MG  capsule Commonly known as: LYRICA Take 75 mg by mouth 3 (three) times daily.   torsemide 20 MG tablet Commonly known as: DEMADEX Take 40 mg by mouth daily.   traMADol 50 MG tablet Commonly known as: ULTRAM Take 1 tablet (50 mg total) by mouth every 6 (six) hours as needed for moderate pain.   traZODone 100 MG tablet Commonly known as: DESYREL Take 100 mg by mouth at bedtime.   Vitamin D3 125 MCG (5000 UT) Tabs Take 1 tablet (5,000 Units total) by mouth daily.        Follow-up Information     your medical doctor Follow up in 5 day(s).                 Discharge Exam: Filed Weights   03/02/22 1259  Weight: 87.1 kg   Physical Exam HENT:     Head: Normocephalic.     Mouth/Throat:     Pharynx: No oropharyngeal exudate.  Eyes:     General: Lids  are normal.     Conjunctiva/sclera: Conjunctivae normal.  Cardiovascular:     Rate and Rhythm: Normal rate and regular rhythm.     Heart sounds: Normal heart sounds, S1 normal and S2 normal.  Pulmonary:     Breath sounds: No decreased breath sounds, wheezing, rhonchi or rales.  Abdominal:     Palpations: Abdomen is soft.     Tenderness: There is no abdominal tenderness.  Musculoskeletal:     Right lower leg: No swelling.     Left lower leg: No swelling.  Skin:    General: Skin is warm.     Findings: No rash.  Neurological:     Mental Status: She is alert and oriented to person, place, and time.      Condition at discharge: stable  The results of significant diagnostics from this hospitalization (including imaging, microbiology, ancillary and laboratory) are listed below for reference.   Imaging Studies: CT HEAD WO CONTRAST ( )  Result Date: 03/02/2022 CLINICAL DATA:  76 year old female with a history of mental status changes EXAM: CT HEAD WITHOUT CONTRAST TECHNIQUE: Contiguous axial images were obtained from the base of the skull through the vertex without intravenous contrast. RADIATION DOSE REDUCTION: This exam was performed according to the departmental dose-optimization program which includes automated exposure control, adjustment of the mA and/or kV according to patient size and/or use of iterative reconstruction technique. COMPARISON:  04/01/2021 FINDINGS: Brain: No acute intracranial hemorrhage. No midline shift or mass effect. Gray-white differentiation maintained. Similar appearance of mild volume loss. Patchy hypodensity in the periventricular white matter. Unremarkable appearance of the ventricular system. Vascular: Intracranial atherosclerosis Skull: No acute fracture.  No aggressive bone lesion identified. Sinuses/Orbits: Unremarkable appearance of the orbits. Mastoid air cells clear. No middle ear effusion. No significant sinus disease. Other: None IMPRESSION: Negative for  acute intracranial abnormality. Periventricular white matter disease and associated intracranial atherosclerosis Electronically Signed   By: Gilmer Mor D.O.   On: 03/02/2022 11:28   DG Chest Portable 1 View  Result Date: 03/02/2022 CLINICAL DATA:  Weakness EXAM: PORTABLE CHEST 1 VIEW COMPARISON:  01/11/2022 FINDINGS: The heart size and mediastinal contours are within normal limits. No focal airspace consolidation, pleural effusion, or pneumothorax. Extensive thoracolumbar spinal fusion hardware. IMPRESSION: No active disease. Electronically Signed   By: Duanne Guess D.O.   On: 03/02/2022 11:12   ECHOCARDIOGRAM LIMITED  Result Date: 03/01/2022    ECHOCARDIOGRAM LIMITED REPORT   Patient Name:   JENEA DAKE Date  of Exam: 03/01/2022 Medical Rec #:  [id number]         Height:       64.0 in Accession #:    [id number]        Weight:       179.0 lb Date of Birth:  1946-02-28         BSA:          1.866 m Patient Age:    38 years          BP:           124/70 mmHg Patient Gender: F                 HR:           62 bpm. Exam Location:  [place] Procedure: Limited Echo, Cardiac Doppler, Color Doppler and Strain Analysis Indications:    Chronic HFrEF (heart failure with reduced ejection fraction)                 (HCC) [I50.22 (ICD-10-CM)]  History:        Patient has prior history of Echocardiogram examinations, most                 recent 10/05/2021. Arrythmias:PAC and PVC,                 Signs/Symptoms:Dizziness/Lightheadedness and Syncope; Risk                 Factors:Hypertension.  Sonographer:    Luane School RDCS Referring Phys: [id number] [place]  1. Left ventricular ejection fraction, by estimation, is 45 to 50%. The left ventricle has mildly decreased function. The left ventricle has no regional wall motion abnormalities. Left ventricular diastolic parameters are indeterminate. The average left  ventricular global longitudinal strain is -16.8 %.  2. Right ventricular systolic function  is normal. The right ventricular size is normal. There is normal pulmonary artery systolic pressure.  3. The mitral valve is normal in structure. No evidence of mitral valve regurgitation. No evidence of mitral stenosis.  4. The aortic valve is normal in structure. Aortic valve regurgitation is not visualized. No aortic stenosis is present.  5. The inferior vena cava is normal in size with greater than 50% respiratory variability, suggesting right atrial pressure of 3 mmHg. FINDINGS  Left Ventricle: Left ventricular ejection fraction, by estimation, is 45 to 50%. The left ventricle has mildly decreased function. The left ventricle has no regional wall motion abnormalities. The average left ventricular global longitudinal strain is -16.8 %. The left ventricular internal cavity size was normal in size. There is no left ventricular hypertrophy. Left ventricular diastolic parameters are indeterminate. Right Ventricle: The right ventricular size is normal. No increase in right ventricular wall thickness. Right ventricular systolic function is normal. There is normal pulmonary artery systolic pressure. The tricuspid regurgitant velocity is 2.23 m/s, and  with an assumed right atrial pressure of 3 mmHg, the estimated right ventricular systolic pressure is [id number] mmHg. Left Atrium: Left atrial size was normal in size. Right Atrium: Right atrial size was normal in size. Pericardium: There is no evidence of pericardial effusion. Mitral Valve: The mitral valve is normal in structure. No evidence of mitral valve stenosis. Tricuspid Valve: The tricuspid valve is normal in structure. Tricuspid valve regurgitation is mild . No evidence of tricuspid stenosis. Aortic Valve: The aortic valve is normal in structure. Aortic valve regurgitation is not visualized. No aortic stenosis is present. Pulmonic Valve: The pulmonic  valve was normal in structure. Pulmonic valve regurgitation is not visualized. No evidence of pulmonic stenosis.  Aorta: The aortic root is normal in size and structure. Venous: The inferior vena cava is normal in size with greater than 50% respiratory variability, suggesting right atrial pressure of 3 mmHg. IAS/Shunts: No atrial level shunt detected by color flow Doppler. LEFT VENTRICLE PLAX 2D LVIDd:         5.40 cm LVIDs:         4.00 cm     2D Longitudinal Strain LV PW:         1.20 cm     2D Strain GLS Avg:     -16.8 % LV IVS:        1.20 cm LVOT diam:     2.00 cm LV SV:         63 LV SV Index:   34 LVOT Area:     3.14 cm  LV Volumes (MOD) LV vol d, MOD A2C: 52.2 ml LV vol d, MOD A4C: 55.3 ml LV vol s, MOD A2C: 26.3 ml LV vol s, MOD A4C: 23.3 ml LV SV MOD A2C:     25.9 ml LV SV MOD A4C:     55.3 ml LV SV MOD BP:      30.0 ml RIGHT VENTRICLE         IVC TAPSE (M-mode): 2.1 cm  IVC diam: 1.00 cm LEFT ATRIUM           Index        RIGHT ATRIUM           Index LA diam:      4.00 cm 2.14 cm/m   RA Area:     11.70 cm LA Vol (A2C): 32.1 ml 17.20 ml/m  RA Volume:   23.40 ml  12.54 ml/m LA Vol (A4C): 46.4 ml 24.86 ml/m  AORTIC VALVE LVOT Vmax:   89.40 cm/s LVOT Vmean:  60.800 cm/s LVOT VTI:    0.201 m  AORTA Ao Root diam: 3.00 cm Ao Asc diam:  3.50 cm TRICUSPID VALVE TR Peak grad:   19.9 mmHg TR Vmax:        223.00 cm/s  SHUNTS Systemic VTI:  0.20 m Systemic Diam: 2.00 cm Ida Rogue MD Electronically signed by Ida Rogue MD Signature Date/Time: 03/01/2022/4:46:26 PM    Final     Microbiology: Results for orders placed or performed during the hospital encounter of 03/02/22  Urine Culture     Status: Abnormal   Collection Time: 03/02/22 10:22 AM   Specimen: Urine, Clean Catch  Result Value Ref Range Status   Specimen Description   Final    URINE, CLEAN CATCH Performed at [facility], [address] [place], [place] [address]    Special Requests   Final    NONE Performed at [facility], [address] [place], [place] [address]    Culture >=100,000 COLONIES/mL ESCHERICHIA COLI (A)   Final   Report Status 03/04/2022 FINAL  Final   Organism ID, Bacteria ESCHERICHIA COLI (A)  Final      Susceptibility   Escherichia coli - MIC*    AMPICILLIN <=2 SENSITIVE Sensitive     CEFAZOLIN <=4 SENSITIVE Sensitive     CEFEPIME <=0.12 SENSITIVE Sensitive     CEFTRIAXONE <=0.25 SENSITIVE Sensitive     CIPROFLOXACIN <=0.25 SENSITIVE Sensitive     GENTAMICIN <=1 SENSITIVE Sensitive     IMIPENEM <=0.25 SENSITIVE Sensitive  NITROFURANTOIN <=16 SENSITIVE Sensitive     TRIMETH/SULFA <=20 SENSITIVE Sensitive     AMPICILLIN/SULBACTAM <=2 SENSITIVE Sensitive     PIP/TAZO <=4 SENSITIVE Sensitive     * >=100,000 COLONIES/mL ESCHERICHIA COLI  SARS Coronavirus 2 by RT PCR (hospital order, performed in [facility] hospital lab) *cepheid single result test*     Status: None   Collection Time: 03/02/22 11:32 AM   Specimen: Nasal Swab  Result Value Ref Range Status   SARS Coronavirus 2 by RT PCR NEGATIVE NEGATIVE Final    Comment: (NOTE) SARS-CoV-2 target nucleic acids are NOT DETECTED.  The SARS-CoV-2 RNA is generally detectable in upper and lower respiratory specimens during the acute phase of infection. The lowest concentration of SARS-CoV-2 viral copies this assay can detect is 250 copies / mL. A negative result does not preclude SARS-CoV-2 infection and should not be used as the sole basis for treatment or other patient management decisions.  A negative result may occur with improper specimen collection / handling, submission of specimen other than nasopharyngeal swab, presence of viral mutation(s) within the areas targeted by this assay, and inadequate number of viral copies (<250 copies / mL). A negative result must be combined with clinical observations, patient history, and epidemiological information.  Fact Sheet for Patients:   https://www.patel.info/  Fact Sheet for Healthcare Providers: https://hall.com/  This test is not yet  approved or  cleared by the [place] FDA and has been authorized for detection and/or diagnosis of SARS-CoV-2 by FDA under an Emergency Use Authorization (EUA).  This EUA will remain in effect (meaning this test can be used) for the duration of the COVID-19 declaration under Section 564(b)(1) of the Act, 21 U.S.C. section 360bbb-3(b)(1), unless the authorization is terminated or revoked sooner.  Performed at [facility], [address] [place], [place] [address]     Labs: CBC: Recent Labs  Lab 03/02/22 1022 03/03/22 0618  WBC 4.2 4.8  HGB 12.7 11.8*  HCT 39.3 35.2*  MCV 95.6 94.4  PLT 133* [id number]   Basic Metabolic Panel: Recent Labs  Lab 03/02/22 1022 03/03/22 0618 03/04/22 0551 03/05/22 0616  NA 141 137 141 137  K 3.6 3.7 3.5 3.5  CL 102 101 102 101  CO2 31 28 29 28   GLUCOSE 100* 91 94 105*  BUN 29* 27* 26* 27*  CREATININE 1.21* 1.14* 1.40* 1.10*  CALCIUM 8.7* 8.4* 9.0 9.2  MG  --   --   --  2.2   Liver Function Tests: No results for input(s): "AST", "ALT", "ALKPHOS", "BILITOT", "PROT", "ALBUMIN" in the last 168 hours. CBG: No results for input(s): "GLUCAP" in the last 168 hours.  Discharge time spent: greater than 30 minutes.  Signed: Loletha Grayer, MD [facility] 03/06/2022

## 2022-03-11 DIAGNOSIS — M2041 Other hammer toe(s) (acquired), right foot: Secondary | ICD-10-CM | POA: Diagnosis not present

## 2022-03-11 DIAGNOSIS — I739 Peripheral vascular disease, unspecified: Secondary | ICD-10-CM | POA: Diagnosis not present

## 2022-03-11 DIAGNOSIS — M2042 Other hammer toe(s) (acquired), left foot: Secondary | ICD-10-CM | POA: Diagnosis not present

## 2022-03-11 DIAGNOSIS — L603 Nail dystrophy: Secondary | ICD-10-CM | POA: Diagnosis not present

## 2022-03-12 ENCOUNTER — Encounter: Payer: Self-pay | Admitting: Cardiology

## 2022-03-12 ENCOUNTER — Ambulatory Visit (INDEPENDENT_AMBULATORY_CARE_PROVIDER_SITE_OTHER): Payer: Medicare HMO | Admitting: Cardiology

## 2022-03-12 VITALS — BP 110/60 | HR 73 | Ht 64.0 in | Wt 175.5 lb

## 2022-03-12 DIAGNOSIS — I3139 Other pericardial effusion (noninflammatory): Secondary | ICD-10-CM

## 2022-03-12 DIAGNOSIS — I10 Essential (primary) hypertension: Secondary | ICD-10-CM | POA: Diagnosis not present

## 2022-03-12 DIAGNOSIS — E782 Mixed hyperlipidemia: Secondary | ICD-10-CM

## 2022-03-12 DIAGNOSIS — I428 Other cardiomyopathies: Secondary | ICD-10-CM | POA: Diagnosis not present

## 2022-03-12 DIAGNOSIS — I5022 Chronic systolic (congestive) heart failure: Secondary | ICD-10-CM | POA: Diagnosis not present

## 2022-03-12 NOTE — Patient Instructions (Signed)
Medication Instructions:   Your physician recommends that you continue on your current medications as directed. Please refer to the Current Medication list given to you today.   *If you need a refill on your cardiac medications before your next appointment, please call your pharmacy*   Lab Work: None Ordered  If you have labs (blood work) drawn today and your tests are completely normal, you will receive your results only by: MyChart Message (if you have MyChart) OR A paper copy in the mail If you have any lab test that is abnormal or we need to change your treatment, we will call you to review the results.   Testing/Procedures: None Ordered   Follow-Up: At [facility], you and your health needs are our priority.  As part of our continuing mission to provide you with exceptional heart care, we have created designated Provider Care Teams.  These Care Teams include your primary Cardiologist (physician) and Advanced Practice Providers (APPs -  Physician Assistants and Nurse Practitioners) who all work together to provide you with the care you need, when you need it.  We recommend signing up for the patient portal called "MyChart".  Sign up information is provided on this After Visit Summary.  MyChart is used to connect with patients for Virtual Visits (Telemedicine).  Patients are able to view lab/test results, encounter notes, upcoming appointments, etc.  Non-urgent messages can be sent to your provider as well.   To learn more about what you can do with MyChart, go to [email or web address].    Your next appointment:   2 month(s)  The format for your next appointment:   In Person  Provider:   You may see Yvonne Kendall, MD or one of the following Advanced Practice Providers on your designated Care Team:   Nicolasa Ducking, NP Eula Listen, PA-C Cadence Fransico Michael, [place]    Other Instructions N/A  Important Information About Sugar

## 2022-03-12 NOTE — Progress Notes (Signed)
Cardiology Clinic Note   Patient Name: Kimberly Cook Date of Encounter: 03/12/2022  Primary Care Provider:  System, Provider Not In Primary Cardiologist:  Nelva Bush, MD  Patient Profile    76 year old female history of nonobstructive CAD, hypertension, hyperlipidemia, hypothyroidism, stroke, GERD, CKD 2, depression, and anxiety, presents for follow-up related to HFrEF (EF 45 to 50%).  Past Medical History    Past Medical History:  Diagnosis Date   Allergy    Anxiety    Arthritis    Back pain    Chronic HFrEF (heart failure with reduced ejection fraction) ([place])    a. 08/2021 echo: EF 30-35%, glob HK, GrI DD; b. 10/2021 Echo: EF 30-35%.   CKD (chronic kidney disease), stage II    Coronary artery disease    a. Mild to moderate CAD in LAD/diagonal by CTA (CT-FFR of apical LAD 0.79); b. 08/2021 Cath: LM nl, LAD min irregs, D1/2/3 nl, LCX nl, OM2/3 nl, RCA 20p, RPDA mild dzs, RPAV nl.   DDD (degenerative disc disease), lumbar    Depression    Diverticulosis    Essential hypertension    GERD (gastroesophageal reflux disease)    Headache    History of shingles    Hyperlipidemia    Hypothyroidism    Lung nodule    Mini stroke 2011   Moderate Pericardial effusion    a. 08/2021 Echo: moderate circumferential pericardial effusion w/o tamponade; b. 10/2021 Echo: EF 30-35%, small to mod circumferential pericardial effusion w/o tamponade.   NICM (nonischemic cardiomyopathy) ([place])    a. 08/2021 Echo: EF 30-35%, glob HK, GrI DD, nl RV fxn, mild-mod dil LA, mod circumferential pericardial eff w/o tamponade, Mod MR; b. 10/2021 Echo: EF 30-35%, glob HK.   Occipital neuralgia    Palpitations    Pleural effusion on left    a. 08/2021 s/p thoracentesis.   Pneumonia 2018   Prediabetes    Stroke [facility])    Stroke ([place])    MRI 04/2008 + left sup. frontal gyrus possibly puntate infarct    Syncope 2019   Urinary tract infection    Vitamin D deficiency    Past Surgical History:   Procedure Laterality Date   ABDOMINAL HYSTERECTOMY     BLADDER SURGERY     2003   BREAST EXCISIONAL BIOPSY Right Over 20 years    Benign   CHOLECYSTECTOMY     gastroplication      JOINT REPLACEMENT Left    KNEE   KNEE ARTHROSCOPY Left 2011   PULSE GENERATOR IMPLANT Left 01/31/2020   Procedure: PLACEMENT RIGHT FLANK PULSE GENERATOR VS REMOVAL SPINAL CORD STIMULATOR;  Surgeon: Deetta Perla, MD;  Location: [facility] ORS;  Service: Neurosurgery;  Laterality: Left;   PULSE GENERATOR IMPLANT Left 04/24/2020   Procedure: REPLACEMENT LEFT FLANK PULSE GENERATOR IMPLANT;  Surgeon: Deetta Perla, MD;  Location: [facility] ORS;  Service: Neurosurgery;  Laterality: Left;  MAC w/ local   right arm fracture     RIGHT/LEFT HEART CATH AND CORONARY ANGIOGRAPHY N/A 08/28/2021   Procedure: RIGHT/LEFT HEART CATH AND CORONARY ANGIOGRAPHY;  Surgeon: Wellington Hampshire, MD;  Location: [place] [facility];  Service: Cardiovascular;  Laterality: N/A;   SPINAL CORD STIMULATOR REMOVAL N/A 06/26/2020   Procedure: SPINAL CORD STIMULATOR REMOVAL;  Surgeon: Deetta Perla, MD;  Location: [facility] ORS;  Service: Neurosurgery;  Laterality: N/A;   THORACIC LAMINECTOMY FOR SPINAL CORD STIMULATOR N/A 01/24/2020   Procedure: THORACIC SPINAL CORD STIMULATOR PADDLE TRIAL VIA LAMINECTOMY;  Surgeon: Lacinda Axon,  Remo Lipps, MD;  Location: [facility] ORS;  Service: Neurosurgery;  Laterality: N/A;   TONSILLECTOMY AND ADENOIDECTOMY     TOTAL KNEE ARTHROPLASTY Left 04/17/2015   Procedure: LEFT TOTAL KNEE ARTHROPLASTY;  Surgeon: Paralee Cancel, MD;  Location: [facility];  Service: Orthopedics;  Laterality: Left;    Allergies  No Known Allergies  History of Present Illness    76 year old female with past medical history including nonobstructive CAD, hypertension, hyperlipidemia, hypothyroidism, CKD 2, stroke, GERD, depression and anxiety.  Prior echocardiogram in August 2018 showed EF 50 to 55% with G1 DD.  Coronary CTA angioplasty in September 2018 showed mild obstructive  CAD involving the proximal LAD and second diagonal.  She subsequently had low risk Myoview in February 2022 in the setting of chest discomfort that was intermittent.  It was [place] was admitted to [facility] in late January 2023 with worsening dyspnea and ankle swelling.  She was also noted to have moderate bilateral pleural effusion.  Repeated echocardiogram showed EF of 30 to 35%, global hypokinesis, moderate mitral regurgitation, and moderate circumferential pericardial effusion without tamponade.  Diagnostic catheterization showed mild proximal RCA disease with otherwise relatively normal coronary arteries.  She continued to be medically managed.  She did require thoracentesis of the left pleural effusion with 500 mL of fluid removed.  GDMT was escalated and she was subsequently discharged to skilled nursing facility on August 30, 2021.  Then followed in clinic on 10/17/2021 with improving minimal leg edema and resolution of shortness of breath.  She continued to have follow-up with heart failure clinic with Darylene Price on 12/20/2021 she had minimal shortness of breath with moderate exertion with no medication changes that were made but and noted weight increase of 14 pounds from 1 month earlier.  She had an emergency department visit on 01/12/2022 for chest pain.  Vitals were normal.  D-dimer was elevated.  CTA chest was negative for PE or other acute findings.  It showed chronic hiatal hernia.  DVT ultrasound was negative.  High-sensitivity troponins negative x 2 and BNP was within normal limits.  She had some leukocytosis.  EKG showed some QTc prolongation which improved.  It was considered her chest pain was musculoskeletal and she was recommended with outpatient follow-up.  She was last seen in clinic on 01/29/2022 she continued to endorse some exertional dyspnea and occasional chest discomfort had a limited echocardiogram that revealed LVEF 45 to 50%, no regional wall motion abnormalities, and no  mention of previously noted pericardial effusion.  She is back in clinic today for hospital follow-up.  She was recently admitted to [facility] on 03/02/2022 for weakness and was subsequently discharged on 03/06/2022.  She presented to the emergency department from [facility] for evaluation of weakness and confusion.  In the emergency department her vitals were stable with exception of her being bradycardic with rate in the 50s.  Labs notable for creatinine of 1.21, mild thrombocytopenia 133, BUN 29, urinalysis with evidence of UTI (large leukocytes, positive nitrate, few bacteria, greater than 50 WBCs).  Patient did have a known history of ESBL recurrent UTIs.  She was discharged on antibiotic therapy with resolve that all of her symptoms of confusion and weakness related to the UTI.  She states that she is doing well today with still continues to have some weakness.  She denies any worsening shortness of breath, chest pain, peripheral edema.  Endorses concerns with her at home today after being on antibiotic therapy of a feeling like sandpaper, advised her  to follow-up with [facility] for possible beginning stages of oral thrush. She is tolerating her medications well continues with her torsemide as previously ordered and is continuing with physical therapy after returning to [facility].  She appears euvolemic on exam today.  Home Medications    Current Outpatient Medications  Medication Sig Dispense Refill   acetaminophen (TYLENOL) 500 MG tablet Take 1,000 mg by mouth every 8 (eight) hours as needed for moderate pain.     alendronate (FOSAMAX) 70 MG tablet Take 70 mg by mouth once a week.      ALPRAZolam (XANAX) 0.5 MG tablet Take 1 tablet (0.5 mg total) by mouth 2 (two) times daily as needed for anxiety. 5 tablet 0   ascorbic acid (VITAMIN C) 500 MG tablet Take 500 mg by mouth daily.     aspirin EC 81 MG tablet Take 1 tablet (81 mg total) by mouth at bedtime. Swallow whole. 90 tablet 3   atorvastatin  (LIPITOR) 20 MG tablet Take 1 tablet (20 mg total) by mouth daily. 90 tablet 3   Biotin 1 MG CAPS Take 1 mg by mouth daily.      carvedilol (COREG) 3.125 MG tablet Take 1 tablet (3.125 mg total) by mouth 2 (two) times daily with a meal. Hold for blood pressure less than 90/60     cetirizine (ZYRTEC) 5 MG tablet Take 5 mg by mouth daily.     Cholecalciferol (VITAMIN D3) 125 MCG (5000 UT) TABS Take 1 tablet (5,000 Units total) by mouth daily. 90 tablet 3   citalopram (CELEXA) 20 MG tablet Take 20 mg by mouth daily.     Cranberry 450 MG TABS Take 1 tablet by mouth every morning.     dapagliflozin propanediol (FARXIGA) 10 MG TABS tablet Take 1 tablet (10 mg total) by mouth daily before breakfast. 30 tablet 6   folic acid (FOLVITE) 448 MCG tablet Take 1 tablet (400 mcg total) by mouth daily. SEPARATE ALL SUPPLEMENTS TO LUNCH OR DINNER AND PRILOSEC NOT TO MESS W/THYROID MED 90 tablet 3   levothyroxine (SYNTHROID) 100 MCG tablet Take 1 tablet (100 mcg total) by mouth daily before breakfast. 30 tablet 0   lidocaine 4 % Place 1 patch onto the skin daily. Remove & Discard patch within 12 hours or as directed by MD     losartan (COZAAR) 25 MG tablet Take 0.5 tablets (12.5 mg total) by mouth daily. Hold for Blood pressure less than 90/60 90 tablet 3   magnesium oxide (MAG-OX) 400 (240 Mg) MG tablet Take 1 tablet (400 mg total) by mouth daily.     melatonin 5 MG TABS Take 1 tablet (5 mg total) by mouth at bedtime.  0   montelukast (SINGULAIR) 10 MG tablet Take 1 tablet (10 mg total) by mouth at bedtime. 90 tablet 3   Multiple Vitamin (MULTIVITAMIN WITH MINERALS) TABS tablet Take 1 tablet by mouth daily.     omeprazole (PRILOSEC) 40 MG capsule Take 1 capsule (40 mg total) by mouth daily. After lunch 90 capsule 3   polyethylene glycol powder (GLYCOLAX/MIRALAX) 17 GM/SCOOP powder Take 17 g by mouth daily as needed for moderate constipation. 255 g 11   potassium chloride SA (KLOR-CON M) 20 MEQ tablet Take 1 tablet  (20 mEq total) by mouth 2 (two) times daily.     pregabalin (LYRICA) 75 MG capsule Take 75 mg by mouth 3 (three) times daily.     torsemide (DEMADEX) 20 MG tablet Take 40 mg by  mouth daily.     traMADol (ULTRAM) 50 MG tablet Take 1 tablet (50 mg total) by mouth every 6 (six) hours as needed for moderate pain. 5 tablet 0   traZODone (DESYREL) 100 MG tablet Take 100 mg by mouth at bedtime.     vitamin B-12 (CYANOCOBALAMIN) 1000 MCG tablet Take 1 tablet (1,000 mcg total) by mouth daily. 90 tablet 3   No current facility-administered medications for this visit.     Family History    Family History  Problem Relation Age of Onset   Heart disease Mother    Hypertension Mother    Diabetes Mother    Heart attack Mother 81   Heart disease Father    Heart attack Father 53   Breast cancer Maternal Aunt    Heart attack Brother    She indicated that her mother is deceased. She indicated that her father is deceased. She indicated that her brother is deceased. She indicated that her maternal aunt is deceased.  Social History    Social History   Socioeconomic History   Marital status: Widowed    Spouse name: Not on file   Number of children: Not on file   Years of education: Not on file   Highest education level: Not on file  Occupational History   Not on file  Tobacco Use   Smoking status: Never    Passive exposure: Yes   Smokeless tobacco: Never   Tobacco comments:    husbands and children smoked in home.   Vaping Use   Vaping Use: Never used  Substance and Sexual Activity   Alcohol use: No   Drug use: No   Sexual activity: Never  Other Topics Concern   Not on file  Social History Narrative   Widowed    Lives at [facility]   Has kids and grandson    Social Determinants of Health   Financial Resource Strain: Low Risk  (07/24/2020)   Overall Financial Resource Strain (CARDIA)    Difficulty of Paying Living Expenses: Not hard at all  Food Insecurity: No Food Insecurity  (07/24/2020)   Hunger Vital Sign    Worried About Running Out of Food in the Last Year: Never true    Ran Out of Food in the Last Year: Never true  Transportation Needs: No Transportation Needs (07/24/2020)   PRAPARE - Hydrologist (Medical): No    Lack of Transportation (Non-Medical): No  Physical Activity: Unknown (07/24/2020)   Exercise Vital Sign    Days of Exercise per Week: 0 days    Minutes of Exercise per Session: Not on file  Stress: No Stress Concern Present (07/24/2020)   [place]    Feeling of Stress : Only a little  Social Connections: Unknown (07/24/2020)   Social Connection and Isolation Panel [NHANES]    Frequency of Communication with Friends and Family: More than three times a week    Frequency of Social Gatherings with Friends and Family: More than three times a week    Attends Religious Services: Not on file    Active Member of Clubs or Organizations: Not on file    Attends Archivist Meetings: Not on file    Marital Status: Not on file  Intimate Partner Violence: Not At Risk (07/24/2020)   Humiliation, Afraid, Rape, and Kick questionnaire    Fear of Current or Ex-Partner: No    Emotionally Abused: No  Physically Abused: No    Sexually Abused: No     Review of Systems    General:  No chills, fever, night sweats or weight changes. Endorses fatigue Cardiovascular:  No chest pain, Endorses exertional dyspnea, endorses peripheral edema, orthopnea, palpitations, paroxysmal nocturnal dyspnea. Dermatological: No rash, lesions/masses Respiratory: No cough, dyspnea Urologic: No hematuria, dysuria Abdominal:   No nausea, vomiting, diarrhea, bright red blood per rectum, melena, or hematemesis Neurologic:  No visual changes, endorses generalized weakness, changes in mental status. All other systems reviewed and are otherwise negative except as noted above.      Physical Exam    VS:  BP 110/60 (BP Location: Left Arm, Patient Position: Sitting, Cuff Size: Normal)   Pulse 73   Ht _0  (1.626 m)   Wt 175 lb 8 oz (79.6 kg)   SpO2 93%   BMI 30.12 kg/m  , BMI Body mass index is 30.12 kg/m.     GEN: Well nourished, well developed, in no acute distress. Visibly fatigued. HEENT: normal. Neck: Supple, no JVD, carotid bruits, or masses. Cardiac: RRR, no murmurs, rubs, or gallops. No clubbing, cyanosis, trace pretibial edema.  Radials/DP/PT 2+ and equal bilaterally.  Respiratory:  Respirations regular and unlabored, clear to auscultation bilaterally. GI: Soft, nontender, nondistended, BS + x 4. MS: no deformity or atrophy. Skin: warm and dry, no rash. Neuro:  Strength and sensation are intact. Psych: Normal affect.  Accessory Clinical Findings    ECG personally reviewed by me today- sinus rate of 73 with occasional PAC's- No acute changes  Lab Results  Component Value Date   WBC 4.8 03/03/2022   HGB 11.8 (L) 03/03/2022   HCT 35.2 (L) 03/03/2022   MCV 94.4 03/03/2022   PLT 155 03/03/2022   Lab Results  Component Value Date   CREATININE 1.10 (H) 03/05/2022   BUN 27 (H) 03/05/2022   NA 137 03/05/2022   K 3.5 03/05/2022   CL 101 03/05/2022   CO2 28 03/05/2022   Lab Results  Component Value Date   ALT 19 03/31/2021   AST 32 03/31/2021   ALKPHOS 102 03/31/2021   BILITOT 1.2 03/31/2021   Lab Results  Component Value Date   CHOL 125 11/24/2019   HDL 46.40 11/24/2019   LDLCALC 47 11/24/2019   TRIG 158.0 (H) 11/24/2019   CHOLHDL 3 11/24/2019    Lab Results  Component Value Date   HGBA1C 5.1 11/24/2019    Assessment & Plan   1.  HFrEF/ NICM with exertional dyspnea and weakness since hospital discharge. Weight is down 4 lbs. Torsemide remains at 40 mg daily. She appears euvolemic on exam today. Limited echocardiogram revealed LVEF 45 to 50%, no regional wall motion abnormalities, and no further evidence of pericardial effusion.  With improved LVEF no referral to EP at this time for ICD consideration.  Continue losartan, Coreg, and torsemide.  History of hypotension limits escalation of GDMT.  2. Nonobstructive CAD with currently being pain free. LHC 08/2021 revealed nonobstructive CAD. EKG without ischemic changes. No further ischemic work-up indicated. Continue asa, statin, and beta blocker therapy.   3.  History of pericardial effusion repeat limited echo made no mention of pericardial effusion.  On previous study pericardial effusion was small to moderate without tamponade.  Continue with torsemide.  4.  History of hypertension blood pressure today 110/60.  Better controlled today.  Still with complaints of weakness.  No escalation of GDMT.  Continue current medications.  5. Hyperlipidemia LDL 11/24/19, needs updated  panel, followed by PCP, continue atorvastatin  6.  Disposition follow-up in 2 month with MD/APP or sooner if needed.  Jataya Wann, NP 03/12/2022, 10:42 AM

## 2022-03-13 DIAGNOSIS — F5101 Primary insomnia: Secondary | ICD-10-CM | POA: Diagnosis not present

## 2022-03-13 DIAGNOSIS — R531 Weakness: Secondary | ICD-10-CM | POA: Diagnosis not present

## 2022-03-13 DIAGNOSIS — I5020 Unspecified systolic (congestive) heart failure: Secondary | ICD-10-CM | POA: Diagnosis not present

## 2022-03-13 DIAGNOSIS — I639 Cerebral infarction, unspecified: Secondary | ICD-10-CM | POA: Diagnosis not present

## 2022-03-13 DIAGNOSIS — R200 Anesthesia of skin: Secondary | ICD-10-CM | POA: Diagnosis not present

## 2022-03-13 DIAGNOSIS — G928 Other toxic encephalopathy: Secondary | ICD-10-CM | POA: Diagnosis not present

## 2022-03-13 DIAGNOSIS — N390 Urinary tract infection, site not specified: Secondary | ICD-10-CM | POA: Diagnosis not present

## 2022-03-13 DIAGNOSIS — N1831 Chronic kidney disease, stage 3a: Secondary | ICD-10-CM | POA: Diagnosis not present

## 2022-03-13 NOTE — Addendum Note (Signed)
Addended by: Festus Aloe on: 03/13/2022 10:22 AM   Modules accepted: Orders

## 2022-03-14 ENCOUNTER — Telehealth: Payer: Self-pay | Admitting: Medical

## 2022-03-14 NOTE — Telephone Encounter (Signed)
New Message:     Waldo Laine nurse from [facility] [place] called. She wanted to report a weight gain.   Pt c/o swelling: STAT is pt has developed SOB within 24 hours  If swelling, where is the swelling located?  Patient have CHF  How much weight have you gained and in what time span? 5 1lbs in a week  Have you gained 3 pounds in a day or 5 pounds in a week? 5 lbs in a week  Do you have a log of your daily weights (if so, list)? yes Are you currently taking a fluid pill? yes  Are you currently SOB? no  Have you traveled recently? no  .

## 2022-03-14 NOTE — Telephone Encounter (Signed)
Charlsie Quest, NP    The patient was just here on 03/12/22 without any symptoms. Since she has no symptoms today. Have them reweigh her in the morning and see if there is a weight difference from today to tomorrow and if need be we can adjust her torsemide accordingly. Thanks.   Kathie Dike, RN and advised her of the above. Waldo Laine stated that the patient has an order for PRN torsemide, and asked if she should give one. I advised her that Lavonna Rua, NP had not said to give extra torsemide, so that she should hold of on giving that at this time.   Kaitlin verbalized understanding and voiced appreciation for the call. She stated that if there was a weight difference tomorrow, she will call.

## 2022-03-18 DIAGNOSIS — K5900 Constipation, unspecified: Secondary | ICD-10-CM | POA: Diagnosis not present

## 2022-03-18 DIAGNOSIS — F32A Depression, unspecified: Secondary | ICD-10-CM | POA: Diagnosis not present

## 2022-03-18 DIAGNOSIS — F419 Anxiety disorder, unspecified: Secondary | ICD-10-CM | POA: Diagnosis not present

## 2022-03-18 DIAGNOSIS — E539 Vitamin B deficiency, unspecified: Secondary | ICD-10-CM | POA: Diagnosis not present

## 2022-03-18 DIAGNOSIS — Z79899 Other long term (current) drug therapy: Secondary | ICD-10-CM | POA: Diagnosis not present

## 2022-03-18 DIAGNOSIS — E039 Hypothyroidism, unspecified: Secondary | ICD-10-CM | POA: Diagnosis not present

## 2022-03-18 DIAGNOSIS — G4700 Insomnia, unspecified: Secondary | ICD-10-CM | POA: Diagnosis not present

## 2022-03-18 DIAGNOSIS — E782 Mixed hyperlipidemia: Secondary | ICD-10-CM | POA: Diagnosis not present

## 2022-03-18 NOTE — Telephone Encounter (Signed)
Called main number for [facility] and requested to speak with nurse. She is off today and then transferred to the Associate Professor. She will fax the daily weights for this month.

## 2022-03-18 NOTE — Telephone Encounter (Signed)
Left voicemail message to call back  

## 2022-03-18 NOTE — Telephone Encounter (Signed)
Provider reviewed daily weights and did not want to make any changes at this time.

## 2022-03-18 NOTE — Telephone Encounter (Signed)
Weights received from [facility]. Provided them to provider for review.

## 2022-03-25 ENCOUNTER — Encounter: Payer: Self-pay | Admitting: Physician Assistant

## 2022-03-25 ENCOUNTER — Ambulatory Visit (INDEPENDENT_AMBULATORY_CARE_PROVIDER_SITE_OTHER): Payer: Medicare HMO | Admitting: Physician Assistant

## 2022-03-25 VITALS — BP 127/86 | HR 64 | Ht 64.0 in | Wt 181.0 lb

## 2022-03-25 DIAGNOSIS — N3289 Other specified disorders of bladder: Secondary | ICD-10-CM

## 2022-03-25 DIAGNOSIS — M19011 Primary osteoarthritis, right shoulder: Secondary | ICD-10-CM | POA: Diagnosis not present

## 2022-03-25 DIAGNOSIS — N3281 Overactive bladder: Secondary | ICD-10-CM | POA: Diagnosis not present

## 2022-03-25 DIAGNOSIS — M542 Cervicalgia: Secondary | ICD-10-CM | POA: Diagnosis not present

## 2022-03-25 DIAGNOSIS — R8271 Bacteriuria: Secondary | ICD-10-CM

## 2022-03-25 DIAGNOSIS — N390 Urinary tract infection, site not specified: Secondary | ICD-10-CM | POA: Diagnosis not present

## 2022-03-25 LAB — MICROSCOPIC EXAMINATION: WBC, UA: >30 /hpf — AB (ref 0–5)

## 2022-03-25 LAB — URINALYSIS, COMPLETE
Bilirubin, UA: NEGATIVE
Ketones, UA: NEGATIVE
Nitrite, UA: POSITIVE — AB
Protein,UA: NEGATIVE
Specific Gravity, UA: 1.01 (ref 1.005–1.030)
Urobilinogen, Ur: 0.2 mg/dL (ref 0.2–1.0)
pH, UA: 6.5 (ref 5.0–7.5)

## 2022-03-25 LAB — BLADDER SCAN AMB NON-IMAGING

## 2022-03-25 NOTE — Progress Notes (Signed)
03/25/2022 2:35 PM   [name] [birth year] [id number]  CC: Chief Complaint  Patient presents with   Urinary Frequency   Urinary Urgency   HPI: Kimberly Cook is a 76 y.o. female with PMH HFrEF on torsemide and Farxiga, recurrent UTI versus asymptomatic bacteriuria, vaginal atrophy, and OAB who developed urinary retention on Gemtesa who presents today for evaluation of frequent UTIs per her facility.  She was admitted from 03/02/2022 to 03/06/2022 with toxic metabolic encephalopathy felt secondary to E. coli UTI and Xanax.  Urine culture grew pansensitive E. coli.  Today she reports she was treated for UTI at [facility] 1-2 times prior to her recent hospitalization.  She was asymptomatic both of these times.  She reports feeling "worn out" by frequent courses of antibiotics.  She states she is having some labial irritation due to urinary incontinence but does not have true dysuria.  She notes some occasional malodorous urine.  With regard to her OAB, she voids approximately every 15 minutes after she takes torsemide.  She skipped torsemide this morning, and she has been voiding less than every 2 hours.  CT stone study dated 02/01/2022 with no urolithiasis.  On personal review, there is some irregular bladder wall thickening, especially along the right and right inferolateral bladder walls.  In-office UA today positive for 3+ glucose, trace intact blood, nitrites, and 1+ leukocyte esterase; urine microscopy with >30 WBCs/HPF, 3-10 RBCs/HPF, and many bacteria. PVR 42m.  PMH: Past Medical History:  Diagnosis Date   Allergy    Anxiety    Arthritis    Back pain    Chronic HFrEF (heart failure with reduced ejection fraction) ([place]    a. 08/2021 echo: EF 30-35%, glob HK, GrI DD; b. 10/2021 Echo: EF 30-35%.   CKD (chronic kidney disease), stage II    Coronary artery disease    a. Mild to moderate CAD in LAD/diagonal by CTA (CT-FFR of apical LAD 0.79); b. 08/2021 Cath: LM nl, LAD min  irregs, D1/2/3 nl, LCX nl, OM2/3 nl, RCA 20p, RPDA mild dzs, RPAV nl.   DDD (degenerative disc disease), lumbar    Depression    Diverticulosis    Essential hypertension    GERD (gastroesophageal reflux disease)    Headache    History of shingles    Hyperlipidemia    Hypothyroidism    Lung nodule    Mini stroke 2011   Moderate Pericardial effusion    a. 08/2021 Echo: moderate circumferential pericardial effusion w/o tamponade; b. 10/2021 Echo: EF 30-35%, small to mod circumferential pericardial effusion w/o tamponade.   NICM (nonischemic cardiomyopathy) ([place]    a. 08/2021 Echo: EF 30-35%, glob HK, GrI DD, nl RV fxn, mild-mod dil LA, mod circumferential pericardial eff w/o tamponade, Mod MR; b. 10/2021 Echo: EF 30-35%, glob HK.   Occipital neuralgia    Palpitations    Pleural effusion on left    a. 08/2021 s/p thoracentesis.   Pneumonia 2018   Prediabetes    Stroke ([facility]    Stroke ([facility]    MRI 04/2008 + left sup. frontal gyrus possibly puntate infarct    Syncope 2019   Urinary tract infection    Vitamin D deficiency     Surgical History: Past Surgical History:  Procedure Laterality Date   ABDOMINAL HYSTERECTOMY     BLADDER SURGERY     2003   BREAST EXCISIONAL BIOPSY Right Over 20 years    Benign   CHOLECYSTECTOMY     gastroplication  JOINT REPLACEMENT Left    KNEE   KNEE ARTHROSCOPY Left 2011   PULSE GENERATOR IMPLANT Left 01/31/2020   Procedure: PLACEMENT RIGHT FLANK PULSE GENERATOR VS REMOVAL SPINAL CORD STIMULATOR;  Surgeon: Deetta Perla, MD;  Location: [facility] OR[facility];  Service: Neurosurgery;  Laterality: Left;   PULSE GENERATOR IMPLANT Left 04/24/2020   Procedure: REPLACEMENT LEFT FLANK PULSE GENERATOR IMPLANT;  Surgeon: Deetta Perla, MD;  Location: [facility];  Service: Neurosurgery;  Laterality: Left;  MAC w/ local   right arm fracture     RIGHT/LEFT HEART CATH AND CORONARY ANGIOGRAPHY N/A 08/28/2021   Procedure: RIGHT/LEFT HEART CATH AND CORONARY ANGIOGRAPHY;  Surgeon:  Wellington Hampshire, MD;  Location: [place] [facility];  Service: Cardiovascular;  Laterality: N/A;   SPINAL CORD STIMULATOR REMOVAL N/A 06/26/2020   Procedure: SPINAL CORD STIMULATOR REMOVAL;  Surgeon: Deetta Perla, MD;  Location: [facility] OR[facility];  Service: Neurosurgery;  Laterality: N/A;   THORACIC LAMINECTOMY FOR SPINAL CORD STIMULATOR N/A 01/24/2020   Procedure: THORACIC SPINAL CORD STIMULATOR PADDLE TRIAL VIA LAMINECTOMY;  Surgeon: Deetta Perla, MD;  Location: [facility];  Service: Neurosurgery;  Laterality: N/A;   TONSILLECTOMY AND ADENOIDECTOMY     TOTAL KNEE ARTHROPLASTY Left 04/17/2015   Procedure: LEFT TOTAL KNEE ARTHROPLASTY;  Surgeon: Paralee Cancel, MD;  Location: [facility];  Service: Orthopedics;  Laterality: Left;    Home Medications:  Allergies as of 03/25/2022   No Known Allergies      Medication List        Accurate as of March 25, 2022  2:35 PM. If you have any questions, ask your nurse or doctor.          acetaminophen 500 MG tablet Commonly known as: TYLENOL Take 1,000 mg by mouth every 8 (eight) hours as needed for moderate pain.   alendronate 70 MG tablet Commonly known as: FOSAMAX Take 70 mg by mouth once a week.   ALPRAZolam 0.5 MG tablet Commonly known as: XANAX Take 1 tablet (0.5 mg total) by mouth 2 (two) times daily as needed for anxiety.   ascorbic acid 500 MG tablet Commonly known as: VITAMIN C Take 500 mg by mouth daily.   aspirin EC 81 MG tablet Take 1 tablet (81 mg total) by mouth at bedtime. Swallow whole.   atorvastatin 20 MG tablet Commonly known as: LIPITOR Take 1 tablet (20 mg total) by mouth daily.   Biotin 1 MG Caps Take 1 mg by mouth daily.   carvedilol 3.125 MG tablet Commonly known as: COREG Take 1 tablet (3.125 mg total) by mouth 2 (two) times daily with a meal. Hold for blood pressure less than 90/60   cetirizine 5 MG tablet Commonly known as: ZYRTEC Take 5 mg by mouth daily.   citalopram 20 MG tablet Commonly known as:  CELEXA Take 20 mg by mouth daily.   Cranberry 450 MG Tabs Take 1 tablet by mouth every morning.   cyanocobalamin 1000 MCG tablet Commonly known as: VITAMIN B12 Take 1 tablet (1,000 mcg total) by mouth daily.   dapagliflozin propanediol 10 MG Tabs tablet Commonly known as: Farxiga Take 1 tablet (10 mg total) by mouth daily before breakfast.   folic acid 789 MCG tablet Commonly known as: FOLVITE Take 1 tablet (400 mcg total) by mouth daily. SEPARATE ALL SUPPLEMENTS TO LUNCH OR DINNER AND PRILOSEC NOT TO MESS W/THYROID MED   levothyroxine 100 MCG tablet Commonly known as: SYNTHROID Take 1 tablet (100 mcg total) by mouth daily before breakfast.   lidocaine  4 % Place 1 patch onto the skin daily. Remove & Discard patch within 12 hours or as directed by MD   losartan 25 MG tablet Commonly known as: COZAAR Take 0.5 tablets (12.5 mg total) by mouth daily. Hold for Blood pressure less than 90/60   magnesium oxide 400 (240 Mg) MG tablet Commonly known as: MAG-OX Take 1 tablet (400 mg total) by mouth daily.   melatonin 5 MG Tabs Take 1 tablet (5 mg total) by mouth at bedtime.   montelukast 10 MG tablet Commonly known as: SINGULAIR Take 1 tablet (10 mg total) by mouth at bedtime.   multivitamin with minerals Tabs tablet Take 1 tablet by mouth daily.   omeprazole 40 MG capsule Commonly known as: PRILOSEC Take 1 capsule (40 mg total) by mouth daily. After lunch   polyethylene glycol powder 17 GM/SCOOP powder Commonly known as: GLYCOLAX/MIRALAX Take 17 g by mouth daily as needed for moderate constipation.   potassium chloride SA 20 MEQ tablet Commonly known as: KLOR-CON M Take 1 tablet (20 mEq total) by mouth 2 (two) times daily.   pregabalin 75 MG capsule Commonly known as: LYRICA Take 75 mg by mouth 3 (three) times daily.   torsemide 20 MG tablet Commonly known as: DEMADEX Take 40 mg by mouth daily.   traMADol 50 MG tablet Commonly known as: ULTRAM Take 1 tablet (50  mg total) by mouth every 6 (six) hours as needed for moderate pain.   traZODone 100 MG tablet Commonly known as: DESYREL Take 100 mg by mouth at bedtime.   Vitamin D3 125 MCG (5000 UT) Tabs Take 1 tablet (5,000 Units total) by mouth daily.        Allergies:  No Known Allergies  Family History: Family History  Problem Relation Age of Onset   Heart disease Mother    Hypertension Mother    Diabetes Mother    Heart attack Mother 47   Heart disease Father    Heart attack Father 46   Breast cancer Maternal Aunt    Heart attack Brother     Social History:   reports that she has never smoked. She has been exposed to tobacco smoke. She has never used smokeless tobacco. She reports that she does not drink alcohol and does not use drugs.  Physical Exam: BP 127/86 (BP Location: Left Arm, Patient Position: Sitting, Cuff Size: Large)   Pulse 64   Ht _0  (1.626 m)   Wt 181 lb (82.1 kg)   BMI 31.07 kg/m   Constitutional:  Alert and oriented, no acute distress, nontoxic appearing HEENT: [place], AT Cardiovascular: No clubbing, cyanosis, or edema Respiratory: Normal respiratory effort, no increased work of breathing Skin: No rashes, bruises or suspicious lesions Neurologic: Grossly intact, no focal deficits, moving all 4 extremities Psychiatric: Normal mood and affect  Laboratory Data: Results for orders placed or performed in visit on 03/25/22  Microscopic Examination   Urine  Result Value Ref Range   WBC, UA >30 (A) 0 - 5 /hpf   RBC, Urine 3-10 (A) 0 - 2 /hpf   Epithelial Cells (non renal) 0-10 0 - 10 /hpf   Bacteria, UA Many (A) None seen/Few  Urinalysis, Complete  Result Value Ref Range   Specific Gravity, UA 1.010 1.005 - 1.030   pH, UA 6.5 5.0 - 7.5   Color, UA Yellow Yellow   Appearance Ur Hazy (A) Clear   Leukocytes,UA 1+ (A) Negative   Protein,UA Negative Negative/Trace   Glucose, UA  3+ (A) Negative   Ketones, UA Negative Negative   RBC, UA Trace (A) Negative    Bilirubin, UA Negative Negative   Urobilinogen, Ur 0.2 0.2 - 1.0 mg/dL   Nitrite, UA Positive (A) Negative   Microscopic Examination See below:   Bladder Scan (Post Void Residual) in office  Result Value Ref Range   Scan Result 85m    Assessment & Plan:   1. Asymptomatic bacteriuria We again discussed my strong suspicions that she is colonized and the difference between asymptomatic bacteriuria and acute cystitis.  We also discussed that her FWilder Gladewith glycosuria is likely contributing to both colonization and possible breakthrough UTIs.  Her UA again appears positive today, but I am deferring therapy as she is not clinically infected at this time.  We discussed that malodorous urine alone does not warrant antibiotic therapy and that indications for antibiotics would include upcoming instrumentation/urologic surgery or acute irritative voiding symptoms.  She agrees that she does not have these today.  We discussed that if she develops frequent symptomatic infections, may recommend following up with cardiology for consideration of discontinuing Farxiga.  She is to stay on this for now. - Urinalysis, Complete  2. OAB (overactive bladder) She continues to empty appropriately off bladder agents.  We discussed that her symptoms are significantly exacerbated with torsemide use and that I will not be able to reverse this effect.  I offered her reinitiation of pharmacotherapy versus consideration of third line OAB treatments including PTNS, intravesical Botox, and InterStim with the ultimate goal of reducing the severity of her urgency and frequency, though we expect she will always have an element of this on torsemide.  Ultimately, in shared decision making we decided to pursue PTNS. - Bladder Scan (Post Void Residual) in office  3. Bladder wall thickening Regular bladder wall thickening noted on her recent CT scan.  I recommended cystoscopy for further evaluation given recent microscopic hematuria  and she agreed.  We discussed that I will obtain a urine culture today and treat her based on the results because of plans for instrumentation and not because I think she is currently clinically infected. - CULTURE, URINE COMPREHENSIVE   Return in about 2 weeks (around 04/08/2022) for Cysto with Dr. BErlene Quan schedule PTNS.  SDebroah Loop PA-C  [facility] [address] [place] [phone]

## 2022-03-26 ENCOUNTER — Ambulatory Visit: Payer: Medicare HMO | Admitting: Family

## 2022-03-27 ENCOUNTER — Ambulatory Visit: Payer: Medicare HMO | Admitting: Family

## 2022-03-27 DIAGNOSIS — H2513 Age-related nuclear cataract, bilateral: Secondary | ICD-10-CM | POA: Diagnosis not present

## 2022-03-27 NOTE — Progress Notes (Unsigned)
Patient ID: Kimberly Cook, female    DOB: 07-03-1946, 76 y.o.   MRN: [id number]  HPI  [name] is a 76 y/o female with a history of CAD, hyperlipidemia, HTN, CKD, stroke, syncope, thyroid disease, anxiety, DDD, depression, diverticulosis, GERD, occipital neuralgia, vitamin D deficiency and chronic heart failure.   Echo report from 03/01/22 reviewed and showed an EF of 45-50%. Echo report from 10/05/21 reviewed and showed an EF of 30-35% with small/moderate pleural effusion. Echo report from 08/26/21 reviewed and showed an EF of 30-35% along with moderate LAE and moderate MR.   RHC/LHC done 08/28/21 and showed: Prox RCA lesion is 20% stenosed.   1.  Mild nonobstructive coronary artery disease. 2.  Left ventricular angiography was not performed.  EF was 30 to 35% by echo. 3.  Right heart catheterization showed mildly elevated right and left filling pressures, mild pulmonary hypertension and mildly reduced cardiac output.  Admitted 03/02/22 due to confusion. UA + for UTI so given antibiotics. Discharged after 4 days. Was in the ED 02/01/22 due to increased urinary frequency. IVF and antibiotics given and she was released. Had 2 other ED visits the month of June.   She presents today for a follow-up visit with a chief complaint of minimal fatigue upon moderate exertion. Describes this as chronic in nature. She has associated cough, shortness of breath, pedal edema, abdominal distention, depression, weakness, light-headedness, weight gain and chronic pain along with this. She denies any difficulty sleeping, palpitations or chest pain.   She does admit that she feels depressed regarding having to live at [facility].   She says that that facility is only wanting her to drink ~ 30 ounces of fluid daily but she says that her mouth stays so dry.   Past Medical History:  Diagnosis Date   Allergy    Anxiety    Arthritis    Back pain    Chronic HFrEF (heart failure with reduced ejection fraction) ([place])     a. 08/2021 echo: EF 30-35%, glob HK, GrI DD; b. 10/2021 Echo: EF 30-35%.   CKD (chronic kidney disease), stage II    Coronary artery disease    a. Mild to moderate CAD in LAD/diagonal by CTA (CT-FFR of apical LAD 0.79); b. 08/2021 Cath: LM nl, LAD min irregs, D1/2/3 nl, LCX nl, OM2/3 nl, RCA 20p, RPDA mild dzs, RPAV nl.   DDD (degenerative disc disease), lumbar    Depression    Diverticulosis    Essential hypertension    GERD (gastroesophageal reflux disease)    Headache    History of shingles    Hyperlipidemia    Hypothyroidism    Lung nodule    Mini stroke 2011   Moderate Pericardial effusion    a. 08/2021 Echo: moderate circumferential pericardial effusion w/o tamponade; b. 10/2021 Echo: EF 30-35%, small to mod circumferential pericardial effusion w/o tamponade.   NICM (nonischemic cardiomyopathy) ([place])    a. 08/2021 Echo: EF 30-35%, glob HK, GrI DD, nl RV fxn, mild-mod dil LA, mod circumferential pericardial eff w/o tamponade, Mod MR; b. 10/2021 Echo: EF 30-35%, glob HK.   Occipital neuralgia    Palpitations    Pleural effusion on left    a. 08/2021 s/p thoracentesis.   Pneumonia 2018   Prediabetes    Stroke [facility])    Stroke ([place])    MRI 04/2008 + left sup. frontal gyrus possibly puntate infarct    Syncope 2019   Urinary tract infection    Vitamin D  deficiency    Past Surgical History:  Procedure Laterality Date   ABDOMINAL HYSTERECTOMY     BLADDER SURGERY     2003   BREAST EXCISIONAL BIOPSY Right Over 20 years    Benign   CHOLECYSTECTOMY     gastroplication      JOINT REPLACEMENT Left    KNEE   KNEE ARTHROSCOPY Left 2011   PULSE GENERATOR IMPLANT Left 01/31/2020   Procedure: PLACEMENT RIGHT FLANK PULSE GENERATOR VS REMOVAL SPINAL CORD STIMULATOR;  Surgeon: Deetta Perla, MD;  Location: [facility] ORS;  Service: Neurosurgery;  Laterality: Left;   PULSE GENERATOR IMPLANT Left 04/24/2020   Procedure: REPLACEMENT LEFT FLANK PULSE GENERATOR IMPLANT;  Surgeon: Deetta Perla, MD;  Location:  [facility] ORS;  Service: Neurosurgery;  Laterality: Left;  MAC w/ local   right arm fracture     RIGHT/LEFT HEART CATH AND CORONARY ANGIOGRAPHY N/A 08/28/2021   Procedure: RIGHT/LEFT HEART CATH AND CORONARY ANGIOGRAPHY;  Surgeon: Wellington Hampshire, MD;  Location: [facility];  Service: Cardiovascular;  Laterality: N/A;   SPINAL CORD STIMULATOR REMOVAL N/A 06/26/2020   Procedure: SPINAL CORD STIMULATOR REMOVAL;  Surgeon: Deetta Perla, MD;  Location: [facility] ORS;  Service: Neurosurgery;  Laterality: N/A;   THORACIC LAMINECTOMY FOR SPINAL CORD STIMULATOR N/A 01/24/2020   Procedure: THORACIC SPINAL CORD STIMULATOR PADDLE TRIAL VIA LAMINECTOMY;  Surgeon: Deetta Perla, MD;  Location: [facility] ORS;  Service: Neurosurgery;  Laterality: N/A;   TONSILLECTOMY AND ADENOIDECTOMY     TOTAL KNEE ARTHROPLASTY Left 04/17/2015   Procedure: LEFT TOTAL KNEE ARTHROPLASTY;  Surgeon: Paralee Cancel, MD;  Location: [facility];  Service: Orthopedics;  Laterality: Left;   Family History  Problem Relation Age of Onset   Heart disease Mother    Hypertension Mother    Diabetes Mother    Heart attack Mother 14   Heart disease Father    Heart attack Father 5   Breast cancer Maternal Aunt    Heart attack Brother    Social History   Tobacco Use   Smoking status: Never    Passive exposure: Yes   Smokeless tobacco: Never   Tobacco comments:    husbands and children smoked in home.   Substance Use Topics   Alcohol use: No   No Known Allergies  Prior to Admission medications   Medication Sig Start Date End Date Taking? Authorizing Provider  acetaminophen (TYLENOL) 500 MG tablet Take 1,000 mg by mouth every 8 (eight) hours as needed for moderate pain.   Yes [provider]  alendronate (FOSAMAX) 70 MG tablet Take 70 mg by mouth once a week.  10/23/19  Yes [provider]  ascorbic acid (VITAMIN C) 500 MG tablet Take 500 mg by mouth daily.   Yes [provider]  aspirin EC 81 MG tablet Take 1 tablet  (81 mg total) by mouth at bedtime. Swallow whole. 10/25/20  Yes McLean-Scocuzza, Nino Glow, MD  atorvastatin (LIPITOR) 20 MG tablet Take 1 tablet (20 mg total) by mouth daily. 06/20/20  Yes McLean-Scocuzza, Nino Glow, MD  Biotin 1 MG CAPS Take 1 mg by mouth daily.    Yes [provider]  carvedilol (COREG) 3.125 MG tablet Take 1 tablet (3.125 mg total) by mouth 2 (two) times daily with a meal. Hold for blood pressure less than 90/60 01/04/22  Yes End, Harrell Gave, MD  cetirizine (ZYRTEC) 5 MG tablet Take 5 mg by mouth daily.   Yes [provider]  Cholecalciferol (VITAMIN D3) 125 MCG (5000 UT)  TABS Take 1 tablet (5,000 Units total) by mouth daily. 10/09/18  Yes McLean-Scocuzza, Nino Glow, MD  citalopram (CELEXA) 20 MG tablet Take 20 mg by mouth daily. 01/10/22  Yes [provider]  Cranberry 450 MG TABS Take 1 tablet by mouth every morning. 01/03/22  Yes [provider]  dapagliflozin propanediol (FARXIGA) 10 MG TABS tablet Take 1 tablet (10 mg total) by mouth daily before breakfast. 09/11/21  Yes Theora Gianotti, NP  folic acid (FOLVITE) 161 MCG tablet Take 1 tablet (400 mcg total) by mouth daily. SEPARATE ALL SUPPLEMENTS TO LUNCH OR DINNER AND PRILOSEC NOT TO MESS W/THYROID MED 12/10/18  Yes McLean-Scocuzza, Nino Glow, MD  levothyroxine (SYNTHROID) 100 MCG tablet Take 1 tablet (100 mcg total) by mouth daily before breakfast. 03/06/22  Yes Wieting, Richard, MD  lidocaine 4 % Place 1 patch onto the skin daily. Remove & Discard patch within 12 hours or as directed by MD 03/06/22  Yes Loletha Grayer, MD  losartan (COZAAR) 25 MG tablet Take 0.5 tablets (12.5 mg total) by mouth daily. Hold for Blood pressure less than 90/60 01/04/22  Yes End, Harrell Gave, MD  magnesium oxide (MAG-OX) 400 (240 Mg) MG tablet Take 1 tablet (400 mg total) by mouth daily. 08/31/21  Yes Nicole Kindred A, DO  melatonin 5 MG TABS Take 1 tablet (5 mg total) by mouth at bedtime. 04/04/21  Yes Val Riles, MD   montelukast (SINGULAIR) 10 MG tablet Take 1 tablet (10 mg total) by mouth at bedtime. 02/14/20  Yes McLean-Scocuzza, Nino Glow, MD  Multiple Vitamin (MULTIVITAMIN WITH MINERALS) TABS tablet Take 1 tablet by mouth daily.   Yes [provider]  omeprazole (PRILOSEC) 40 MG capsule Take 1 capsule (40 mg total) by mouth daily. After lunch 06/23/20  Yes McLean-Scocuzza, Nino Glow, MD  polyethylene glycol powder (GLYCOLAX/MIRALAX) 17 GM/SCOOP powder Take 17 g by mouth daily as needed for moderate constipation. 10/21/19  Yes McLean-Scocuzza, Nino Glow, MD  potassium chloride SA (KLOR-CON M) 20 MEQ tablet Take 1 tablet (20 mEq total) by mouth 2 (two) times daily. 08/30/21  Yes Nicole Kindred A, DO  pregabalin (LYRICA) 75 MG capsule Take 75 mg by mouth 3 (three) times daily.   Yes [provider]  torsemide (DEMADEX) 20 MG tablet Take 40 mg by mouth daily.   Yes [provider]  traMADol (ULTRAM) 50 MG tablet Take 1 tablet (50 mg total) by mouth every 6 (six) hours as needed for moderate pain. 03/06/22  Yes Wieting, Richard, MD  traZODone (DESYREL) 100 MG tablet Take 100 mg by mouth at bedtime. 12/13/21  Yes [provider]  vitamin B-12 (CYANOCOBALAMIN) 1000 MCG tablet Take 1 tablet (1,000 mcg total) by mouth daily. 10/09/18  Yes McLean-Scocuzza, Nino Glow, MD  ALPRAZolam Duanne Moron) 0.5 MG tablet Take 1 tablet (0.5 mg total) by mouth 2 (two) times daily as needed for anxiety. Patient not taking: Reported on 03/28/2022 03/06/22   Loletha Grayer, MD    'Review of Systems  Constitutional:  Positive for fatigue. Negative for appetite change.  HENT:  Negative for congestion, postnasal drip and sore throat.   Eyes: Negative.   Respiratory:  Positive for cough (dry) and shortness of breath. Negative for chest tightness.   Cardiovascular:  Positive for leg swelling ("very little"). Negative for chest pain and palpitations.  Gastrointestinal:  Positive for abdominal distention ("feel bloated").  Negative for abdominal pain.  Endocrine: Negative.   Genitourinary: Negative.   Musculoskeletal:  Positive for arthralgias (both  knees) and back pain (chronic due to 4 rods in back). Negative for neck pain.  Skin: Negative.   Allergic/Immunologic: Negative.   Neurological:  Positive for weakness and light-headedness. Negative for dizziness.  Hematological:  Negative for adenopathy. Does not bruise/bleed easily.  Psychiatric/Behavioral:  Positive for dysphoric mood. Negative for sleep disturbance (sleeping on 1 pillow). The patient is not nervous/anxious.    Vitals:   03/28/22 1105  BP: 110/64  Pulse: (!) 59  Resp: 16  SpO2: 99%  Weight: 185 lb 2 oz (84 kg)  Height: _0  (1.626 m)   Wt Readings from Last 3 Encounters:  03/28/22 185 lb 2 oz (84 kg)  03/25/22 181 lb (82.1 kg)  03/12/22 175 lb 8 oz (79.6 kg)   Lab Results  Component Value Date   CREATININE 1.10 (H) 03/05/2022   CREATININE 1.40 (H) 03/04/2022   CREATININE 1.14 (H) 03/03/2022   Physical Exam Vitals and nursing note reviewed.  Constitutional:      Appearance: She is well-developed.  HENT:     Head: Normocephalic and atraumatic.  Cardiovascular:     Rate and Rhythm: Regular rhythm. Bradycardia present.  Pulmonary:     Effort: Pulmonary effort is normal. No respiratory distress.     Breath sounds: No wheezing or rales.  Abdominal:     General: There is no distension.     Palpations: Abdomen is soft.     Tenderness: There is no abdominal tenderness.  Musculoskeletal:     Cervical back: Normal range of motion and neck supple.     Right lower leg: Edema (trace pitting) present.     Left lower leg: No tenderness. Edema (trace pitting) present.  Skin:    General: Skin is warm and dry.  Neurological:     General: No focal deficit present.     Mental Status: She is alert and oriented to person, place, and time.  Psychiatric:        Mood and Affect: Mood normal.        Behavior: Behavior normal.      Assessment & Plan:  1: Chronic heart failure with reduced ejection fraction (although improving)- - NYHA class II - euvolemic today - being weighed daily; reminded to call for an overnight weight gain of > 2 pounds or a weekly weight gain of >5 pounds - weight up 11 pounds from last visit here 3 months ago - currently not adding salt except to grits; reviewed the importance of not adding any salt to her food if possible - on GDMT of carvedilol, farxiga & losartan  - order written to stop losartan and begin entresto 24/99m BID - will check labs at next visit - order written that she can drink 50 ounces of fluids daily - does wear oxygen at 2L at bedtime and PRN during the day - BNP 08/25/21 was 353.9  2: HTN- - BP looks good (110/64) - [facility] and showed sodium 137, potassium 3.5, creatinine 1.1 and GFR 52  3: CAD- - saw cardiology ([place]) 03/12/22 - currently taking aspirin, atorvastatin   Facility medication list reviewed.

## 2022-03-28 ENCOUNTER — Encounter: Payer: Self-pay | Admitting: Family

## 2022-03-28 ENCOUNTER — Ambulatory Visit: Payer: Medicare HMO | Attending: Family | Admitting: Family

## 2022-03-28 VITALS — BP 110/64 | HR 59 | Resp 16 | Ht 64.0 in | Wt 185.1 lb

## 2022-03-28 DIAGNOSIS — E559 Vitamin D deficiency, unspecified: Secondary | ICD-10-CM | POA: Insufficient documentation

## 2022-03-28 DIAGNOSIS — I5022 Chronic systolic (congestive) heart failure: Secondary | ICD-10-CM | POA: Diagnosis not present

## 2022-03-28 DIAGNOSIS — F419 Anxiety disorder, unspecified: Secondary | ICD-10-CM | POA: Diagnosis not present

## 2022-03-28 DIAGNOSIS — N182 Chronic kidney disease, stage 2 (mild): Secondary | ICD-10-CM | POA: Insufficient documentation

## 2022-03-28 DIAGNOSIS — I10 Essential (primary) hypertension: Secondary | ICD-10-CM

## 2022-03-28 DIAGNOSIS — I2510 Atherosclerotic heart disease of native coronary artery without angina pectoris: Secondary | ICD-10-CM | POA: Diagnosis not present

## 2022-03-28 DIAGNOSIS — M5481 Occipital neuralgia: Secondary | ICD-10-CM | POA: Diagnosis not present

## 2022-03-28 DIAGNOSIS — E079 Disorder of thyroid, unspecified: Secondary | ICD-10-CM | POA: Diagnosis not present

## 2022-03-28 DIAGNOSIS — F32A Depression, unspecified: Secondary | ICD-10-CM | POA: Diagnosis not present

## 2022-03-28 DIAGNOSIS — Z7984 Long term (current) use of oral hypoglycemic drugs: Secondary | ICD-10-CM | POA: Insufficient documentation

## 2022-03-28 DIAGNOSIS — E785 Hyperlipidemia, unspecified: Secondary | ICD-10-CM | POA: Insufficient documentation

## 2022-03-28 DIAGNOSIS — I25118 Atherosclerotic heart disease of native coronary artery with other forms of angina pectoris: Secondary | ICD-10-CM

## 2022-03-28 DIAGNOSIS — R55 Syncope and collapse: Secondary | ICD-10-CM | POA: Insufficient documentation

## 2022-03-28 DIAGNOSIS — Z8673 Personal history of transient ischemic attack (TIA), and cerebral infarction without residual deficits: Secondary | ICD-10-CM | POA: Insufficient documentation

## 2022-03-28 DIAGNOSIS — Z7982 Long term (current) use of aspirin: Secondary | ICD-10-CM | POA: Diagnosis not present

## 2022-03-28 DIAGNOSIS — I272 Pulmonary hypertension, unspecified: Secondary | ICD-10-CM | POA: Insufficient documentation

## 2022-03-28 DIAGNOSIS — I130 Hypertensive heart and chronic kidney disease with heart failure and stage 1 through stage 4 chronic kidney disease, or unspecified chronic kidney disease: Secondary | ICD-10-CM | POA: Diagnosis not present

## 2022-03-28 DIAGNOSIS — K219 Gastro-esophageal reflux disease without esophagitis: Secondary | ICD-10-CM | POA: Diagnosis not present

## 2022-03-28 LAB — CULTURE, URINE COMPREHENSIVE

## 2022-03-28 MED ORDER — SACUBITRIL-VALSARTAN 24-26 MG PO TABS: 1.0000 | ORAL_TABLET | Freq: Two times a day (BID) | ORAL | 3 refills | Status: DC

## 2022-03-28 NOTE — Patient Instructions (Signed)
Continue weighing daily and call for an overnight weight gain of 3 pounds or more or a weekly weight gain of more than 5 pounds.  °

## 2022-04-01 DIAGNOSIS — F32A Depression, unspecified: Secondary | ICD-10-CM | POA: Diagnosis not present

## 2022-04-01 DIAGNOSIS — E039 Hypothyroidism, unspecified: Secondary | ICD-10-CM | POA: Diagnosis not present

## 2022-04-01 DIAGNOSIS — E539 Vitamin B deficiency, unspecified: Secondary | ICD-10-CM | POA: Diagnosis not present

## 2022-04-01 DIAGNOSIS — E782 Mixed hyperlipidemia: Secondary | ICD-10-CM | POA: Diagnosis not present

## 2022-04-01 DIAGNOSIS — F5101 Primary insomnia: Secondary | ICD-10-CM | POA: Diagnosis not present

## 2022-04-01 DIAGNOSIS — K5900 Constipation, unspecified: Secondary | ICD-10-CM | POA: Diagnosis not present

## 2022-04-01 DIAGNOSIS — G4700 Insomnia, unspecified: Secondary | ICD-10-CM | POA: Diagnosis not present

## 2022-04-01 DIAGNOSIS — K219 Gastro-esophageal reflux disease without esophagitis: Secondary | ICD-10-CM | POA: Diagnosis not present

## 2022-04-01 DIAGNOSIS — F419 Anxiety disorder, unspecified: Secondary | ICD-10-CM | POA: Diagnosis not present

## 2022-04-01 DIAGNOSIS — F4323 Adjustment disorder with mixed anxiety and depressed mood: Secondary | ICD-10-CM | POA: Diagnosis not present

## 2022-04-04 ENCOUNTER — Other Ambulatory Visit: Payer: Self-pay | Admitting: *Deleted

## 2022-04-04 MED ORDER — SULFAMETHOXAZOLE-TRIMETHOPRIM 800-160 MG PO TABS: 1.0000 | ORAL_TABLET | Freq: Two times a day (BID) | ORAL | 0 refills | Status: AC

## 2022-04-09 DIAGNOSIS — G629 Polyneuropathy, unspecified: Secondary | ICD-10-CM | POA: Diagnosis not present

## 2022-04-09 DIAGNOSIS — M545 Low back pain, unspecified: Secondary | ICD-10-CM | POA: Diagnosis not present

## 2022-04-10 ENCOUNTER — Other Ambulatory Visit: Payer: Self-pay | Admitting: Urology

## 2022-04-10 ENCOUNTER — Encounter: Payer: Self-pay | Admitting: Urology

## 2022-04-10 ENCOUNTER — Ambulatory Visit (INDEPENDENT_AMBULATORY_CARE_PROVIDER_SITE_OTHER): Payer: Medicare HMO | Admitting: Urology

## 2022-04-10 VITALS — BP 134/88 | HR 75 | Ht 64.0 in | Wt 185.0 lb

## 2022-04-10 DIAGNOSIS — N3289 Other specified disorders of bladder: Secondary | ICD-10-CM | POA: Diagnosis not present

## 2022-04-10 DIAGNOSIS — N390 Urinary tract infection, site not specified: Secondary | ICD-10-CM

## 2022-04-10 LAB — URINALYSIS, COMPLETE
Bilirubin, UA: NEGATIVE
Ketones, UA: NEGATIVE
Nitrite, UA: NEGATIVE
Protein,UA: NEGATIVE
Specific Gravity, UA: 1.01 (ref 1.005–1.030)
Urobilinogen, Ur: 0.2 mg/dL (ref 0.2–1.0)
pH, UA: 5.5 (ref 5.0–7.5)

## 2022-04-10 LAB — MICROSCOPIC EXAMINATION: WBC, UA: >30 /hpf — AB (ref 0–5)

## 2022-04-10 NOTE — Progress Notes (Signed)
Surgical Physician Order Form [facility] [place]  * Scheduling expectation : Next Available  *Length of Case:   *Clearance needed: no  *Anticoagulation Instructions: Hold all anticoagulants  *Aspirin Instructions: Ok to continue Aspirin  *Post-op visit Date/Instructions:   TBD  *Diagnosis:  Bladder erythema  *Procedure: bilateral  retrograde pyelogram, bladder biopsy   Additional orders: N/A  -Admit type: OUTpatient  -Anesthesia: General  -VTE Prophylaxis Standing Order SCD's       Other:   -Standing Lab Orders Per Anesthesia    Lab other: UA&Urine Culture  -Standing Test orders EKG/Chest x-ray per Anesthesia       Test other:   - Medications: Gentamicin dosing per pharmacy  -Other orders:  N/A

## 2022-04-10 NOTE — Progress Notes (Signed)
   04/10/22  CC:  Chief Complaint  Patient presents with   Cysto    HPI: 76 year old female with a personal history of bacteriuria in early May who presents today for cystoscopic evaluation of bladder wall thickening.  Notably, she was treated for the past 5 days with Bactrim DS in the setting of chronic bacteriuria in light of today's cystoscopy.  There were no vitals taken for this visit. NED. A&Ox3.   No respiratory distress   Abd soft, NT, ND Normal external genitalia with patent urethral meatus  Cystoscopy Procedure Note  Patient identification was confirmed, informed consent was obtained, and patient was prepped using Betadine solution.  Lidocaine jelly was administered per urethral meatus.    Procedure: - Flexible cystoscope introduced, without any difficulty.   - Thorough search of the bladder revealed:    normal urethral meatus    Significant urinary debris with extensive squamous metaplasia at the bladder neck involving the trigone    Area of patchy erythema on right anterior lateral bladder wall with hypervascularity, nonspecific, cannot exclude CIS versus cystitis    no stones    no ulcers     no tumors    Mild/ moderate trabeculation  Post-Procedure: - Patient tolerated the procedure well  Assessment/ Plan:  1.  Bladder wall thickening/bladder erythema Differential diagnosis includes CIS versus chronic cystitis from bacterial colonization  Based on the appearance today, I recommended consideration of cystoscopy with bladder biopsy in the operating room.  We discussed the risk and benefits including risk of bleeding, infection, demonstrating structures amongst others.  We also plan to proceed with bilateral retrograde pyelogram well intraoperatively.  She is agreeable this plan.  We will plan for another preoperative urine culture and likely need to treat perioperatively based on her history.  Vanna Scotland, MD

## 2022-04-10 NOTE — Patient Instructions (Signed)
Bladder Biopsy A bladder biopsy is a procedure to remove a small sample of tissue from the bladder. The tissue is checked under a microscope to diagnose or rule out bladder cancer. During a bladder biopsy, your health care provider may guide a long, thin instrument with a lighted camera (cystoscope) through your urethra and into your bladder. This lets your health care provider see the lining of your urethra and bladder and take a tissue sample. If your ureters need to be checked, a longer tube (ureteroscope) may be used instead. Tell your health care provider about: Any allergies you have. All medicines you are taking, including vitamins, herbs, eye drops, creams, and over-the-counter medicines. Any problems you or family members have had with anesthetic medicines. Any bleeding problems you have. Any surgeries you have had. Any medical conditions you have. Whether you are pregnant or may be pregnant. What are the risks? Generally, this is a safe procedure. However, problems may occur, including: Bleeding. Infection. Allergic reactions to medicines. Damage to nearby structures or organs. These may include the urethra, bladder, and ureters. Pain in the abdomen and pain when you urinate. Narrowing of the urethra from scar tissue. Trouble urinating from swelling. What happens before the procedure? Medicines Ask your health care provider about: Changing or stopping your regular medicines. This is especially important if you are taking diabetes medicines or blood thinners. Taking medicines such as aspirin and ibuprofen. These medicines can thin your blood. Do not take these medicines unless your health care provider tells you to take them. Taking over-the-counter medicines, vitamins, herbs, and supplements. Surgery safety Ask your health care provider: How your surgery site will be marked. What steps will be taken to help prevent infection. These steps may include: Removing hair at the  surgery site. Washing skin with a germ-killing soap. Receiving antibiotic medicine. General instructions Follow instructions from your health care provider about eating and drinking restrictions. You may be asked to drink plenty of fluids. You may be asked to urinate right before the procedure. Your urine may be tested for infection. If you will be going home right after the procedure, plan to have a responsible adult: Take you home from the hospital or clinic. You will not be allowed to drive. Care for you for the time you are told. What happens during the procedure?  An IV may be inserted into one of your veins. You may be given one or more of the following: A medicine to help you relax (sedative). A medicine to numb the opening of the urethra (local anesthetic). A medicine to make you fall asleep (general anesthetic). You will lie on your back with your knees bent and spread apart. The cystoscope or ureteroscope will be put into your urethra and guided into your bladder or ureters. Your bladder may be filled with germ-free (sterile) water. This will help your health care provider see the wall or lining of your bladder. Small instruments will be put through the scope to take a tissue sample to look at under a microscope. The procedure may vary among health care providers and hospitals. What happens after the procedure? Your blood pressure, heart rate, breathing rate, and blood oxygen level will be monitored until you leave the hospital or clinic. You may be asked to empty your bladder, or it may be emptied for you. If you were given a sedative during the procedure, it can affect you for several hours. Do not drive or operate machinery until your health care provider says that   it is safe. Summary A bladder biopsy is a procedure to remove a small tissue sample from the bladder. A bladder biopsy can be done to diagnose or rule out bladder cancer. Follow instructions from your health care  provider about what you can eat or drink and about any changes to your medicines. Plan to have a responsible adult take you home and care for you as told. This information is not intended to replace advice given to you by your health care provider. Make sure you discuss any questions you have with your health care provider. Document Revised: 07/01/2021 Document Reviewed: 07/01/2021 Elsevier Patient Education  2023 [employer]  

## 2022-04-11 ENCOUNTER — Inpatient Hospital Stay
Admission: EM | Admit: 2022-04-11 | Discharge: 2022-04-17 | DRG: 312 | Disposition: A | Discharge status: Skilled Nursing Facility | Payer: Medicare HMO | Source: Skilled Nursing Facility | Attending: Obstetrics and Gynecology | Admitting: Obstetrics and Gynecology

## 2022-04-11 ENCOUNTER — Other Ambulatory Visit: Payer: Self-pay

## 2022-04-11 ENCOUNTER — Emergency Department: Payer: Medicare HMO

## 2022-04-11 ENCOUNTER — Encounter: Payer: Self-pay | Admitting: Emergency Medicine

## 2022-04-11 DIAGNOSIS — R55 Syncope and collapse: Secondary | ICD-10-CM | POA: Diagnosis present

## 2022-04-11 DIAGNOSIS — M79661 Pain in right lower leg: Secondary | ICD-10-CM | POA: Diagnosis not present

## 2022-04-11 DIAGNOSIS — Z7401 Bed confinement status: Secondary | ICD-10-CM | POA: Diagnosis not present

## 2022-04-11 DIAGNOSIS — Z79890 Hormone replacement therapy (postmenopausal): Secondary | ICD-10-CM

## 2022-04-11 DIAGNOSIS — I10 Essential (primary) hypertension: Secondary | ICD-10-CM | POA: Diagnosis not present

## 2022-04-11 DIAGNOSIS — S0101XA Laceration without foreign body of scalp, initial encounter: Secondary | ICD-10-CM

## 2022-04-11 DIAGNOSIS — I130 Hypertensive heart and chronic kidney disease with heart failure and stage 1 through stage 4 chronic kidney disease, or unspecified chronic kidney disease: Secondary | ICD-10-CM | POA: Diagnosis present

## 2022-04-11 DIAGNOSIS — I5022 Chronic systolic (congestive) heart failure: Secondary | ICD-10-CM | POA: Diagnosis present

## 2022-04-11 DIAGNOSIS — Z1612 Extended spectrum beta lactamase (ESBL) resistance: Secondary | ICD-10-CM | POA: Diagnosis present

## 2022-04-11 DIAGNOSIS — R58 Hemorrhage, not elsewhere classified: Secondary | ICD-10-CM | POA: Diagnosis not present

## 2022-04-11 DIAGNOSIS — N390 Urinary tract infection, site not specified: Secondary | ICD-10-CM | POA: Diagnosis present

## 2022-04-11 DIAGNOSIS — S0181XA Laceration without foreign body of other part of head, initial encounter: Secondary | ICD-10-CM | POA: Diagnosis present

## 2022-04-11 DIAGNOSIS — M419 Scoliosis, unspecified: Secondary | ICD-10-CM | POA: Diagnosis not present

## 2022-04-11 DIAGNOSIS — I951 Orthostatic hypotension: Secondary | ICD-10-CM | POA: Diagnosis present

## 2022-04-11 DIAGNOSIS — R519 Headache, unspecified: Secondary | ICD-10-CM | POA: Diagnosis present

## 2022-04-11 DIAGNOSIS — I959 Hypotension, unspecified: Secondary | ICD-10-CM | POA: Diagnosis not present

## 2022-04-11 DIAGNOSIS — G8929 Other chronic pain: Secondary | ICD-10-CM | POA: Diagnosis present

## 2022-04-11 DIAGNOSIS — Z23 Encounter for immunization: Secondary | ICD-10-CM | POA: Diagnosis present

## 2022-04-11 DIAGNOSIS — M4325 Fusion of spine, thoracolumbar region: Secondary | ICD-10-CM | POA: Diagnosis not present

## 2022-04-11 DIAGNOSIS — F419 Anxiety disorder, unspecified: Secondary | ICD-10-CM | POA: Diagnosis not present

## 2022-04-11 DIAGNOSIS — E876 Hypokalemia: Secondary | ICD-10-CM

## 2022-04-11 DIAGNOSIS — Z20822 Contact with and (suspected) exposure to covid-19: Secondary | ICD-10-CM | POA: Diagnosis present

## 2022-04-11 DIAGNOSIS — Z96652 Presence of left artificial knee joint: Secondary | ICD-10-CM | POA: Diagnosis present

## 2022-04-11 DIAGNOSIS — S0990XA Unspecified injury of head, initial encounter: Secondary | ICD-10-CM | POA: Diagnosis not present

## 2022-04-11 DIAGNOSIS — E039 Hypothyroidism, unspecified: Secondary | ICD-10-CM | POA: Diagnosis present

## 2022-04-11 DIAGNOSIS — Z8673 Personal history of transient ischemic attack (TIA), and cerebral infarction without residual deficits: Secondary | ICD-10-CM

## 2022-04-11 DIAGNOSIS — I5020 Unspecified systolic (congestive) heart failure: Secondary | ICD-10-CM | POA: Diagnosis not present

## 2022-04-11 DIAGNOSIS — N183 Chronic kidney disease, stage 3 unspecified: Secondary | ICD-10-CM | POA: Diagnosis present

## 2022-04-11 DIAGNOSIS — I2510 Atherosclerotic heart disease of native coronary artery without angina pectoris: Secondary | ICD-10-CM | POA: Diagnosis present

## 2022-04-11 DIAGNOSIS — B9620 Unspecified Escherichia coli [E. coli] as the cause of diseases classified elsewhere: Secondary | ICD-10-CM | POA: Diagnosis present

## 2022-04-11 DIAGNOSIS — N1831 Chronic kidney disease, stage 3a: Secondary | ICD-10-CM | POA: Diagnosis present

## 2022-04-11 DIAGNOSIS — Z043 Encounter for examination and observation following other accident: Secondary | ICD-10-CM | POA: Diagnosis not present

## 2022-04-11 DIAGNOSIS — M48061 Spinal stenosis, lumbar region without neurogenic claudication: Secondary | ICD-10-CM | POA: Diagnosis present

## 2022-04-11 DIAGNOSIS — W19XXXA Unspecified fall, initial encounter: Secondary | ICD-10-CM | POA: Insufficient documentation

## 2022-04-11 DIAGNOSIS — Z9049 Acquired absence of other specified parts of digestive tract: Secondary | ICD-10-CM

## 2022-04-11 DIAGNOSIS — M549 Dorsalgia, unspecified: Secondary | ICD-10-CM | POA: Diagnosis not present

## 2022-04-11 DIAGNOSIS — Z87440 Personal history of urinary (tract) infections: Secondary | ICD-10-CM

## 2022-04-11 DIAGNOSIS — M5416 Radiculopathy, lumbar region: Secondary | ICD-10-CM | POA: Diagnosis not present

## 2022-04-11 DIAGNOSIS — Z515 Encounter for palliative care: Secondary | ICD-10-CM

## 2022-04-11 DIAGNOSIS — F32A Depression, unspecified: Secondary | ICD-10-CM | POA: Diagnosis present

## 2022-04-11 DIAGNOSIS — S199XXA Unspecified injury of neck, initial encounter: Secondary | ICD-10-CM | POA: Diagnosis not present

## 2022-04-11 DIAGNOSIS — K219 Gastro-esophageal reflux disease without esophagitis: Secondary | ICD-10-CM | POA: Diagnosis present

## 2022-04-11 DIAGNOSIS — Z66 Do not resuscitate: Secondary | ICD-10-CM | POA: Diagnosis present

## 2022-04-11 DIAGNOSIS — Z79899 Other long term (current) drug therapy: Secondary | ICD-10-CM

## 2022-04-11 DIAGNOSIS — E785 Hyperlipidemia, unspecified: Secondary | ICD-10-CM | POA: Diagnosis present

## 2022-04-11 DIAGNOSIS — R404 Transient alteration of awareness: Secondary | ICD-10-CM

## 2022-04-11 DIAGNOSIS — Z6830 Body mass index (BMI) 30.0-30.9, adult: Secondary | ICD-10-CM

## 2022-04-11 DIAGNOSIS — Z7983 Long term (current) use of bisphosphonates: Secondary | ICD-10-CM

## 2022-04-11 DIAGNOSIS — Z90710 Acquired absence of both cervix and uterus: Secondary | ICD-10-CM

## 2022-04-11 DIAGNOSIS — G629 Polyneuropathy, unspecified: Secondary | ICD-10-CM

## 2022-04-11 DIAGNOSIS — M79662 Pain in left lower leg: Secondary | ICD-10-CM | POA: Diagnosis not present

## 2022-04-11 DIAGNOSIS — Z7982 Long term (current) use of aspirin: Secondary | ICD-10-CM

## 2022-04-11 DIAGNOSIS — S0003XA Contusion of scalp, initial encounter: Secondary | ICD-10-CM | POA: Diagnosis not present

## 2022-04-11 DIAGNOSIS — Z8249 Family history of ischemic heart disease and other diseases of the circulatory system: Secondary | ICD-10-CM

## 2022-04-11 DIAGNOSIS — M1990 Unspecified osteoarthritis, unspecified site: Secondary | ICD-10-CM | POA: Diagnosis present

## 2022-04-11 DIAGNOSIS — L089 Local infection of the skin and subcutaneous tissue, unspecified: Secondary | ICD-10-CM

## 2022-04-11 DIAGNOSIS — G44309 Post-traumatic headache, unspecified, not intractable: Secondary | ICD-10-CM | POA: Diagnosis present

## 2022-04-11 DIAGNOSIS — H539 Unspecified visual disturbance: Secondary | ICD-10-CM | POA: Diagnosis not present

## 2022-04-11 LAB — COMPREHENSIVE METABOLIC PANEL
ALT: 16 U/L (ref 0–44)
AST: 23 U/L (ref 15–41)
Albumin: 3.9 g/dL (ref 3.5–5.0)
Alkaline Phosphatase: 74 U/L (ref 38–126)
Anion gap: 10 (ref 5–15)
BUN: 23 mg/dL (ref 8–23)
CO2: 27 mmol/L (ref 22–32)
Calcium: 8.7 mg/dL — ABNORMAL LOW (ref 8.9–10.3)
Chloride: 102 mmol/L (ref 98–111)
Creatinine, Ser: 1.13 mg/dL — ABNORMAL HIGH (ref 0.44–1.00)
GFR, Estimated: 50 mL/min — ABNORMAL LOW (ref >60)
Glucose, Bld: 111 mg/dL — ABNORMAL HIGH (ref 70–99)
Potassium: 3.3 mmol/L — ABNORMAL LOW (ref 3.5–5.1)
Sodium: 139 mmol/L (ref 135–145)
Total Bilirubin: 0.7 mg/dL (ref 0.3–1.2)
Total Protein: 7 g/dL (ref 6.5–8.1)

## 2022-04-11 LAB — T4, FREE: Free T4: 1.6 ng/dL — ABNORMAL HIGH (ref 0.61–1.12)

## 2022-04-11 LAB — URINALYSIS, ROUTINE W REFLEX MICROSCOPIC
Bilirubin Urine: NEGATIVE
Glucose, UA: >=500 mg/dL — AB
Hgb urine dipstick: NEGATIVE
Ketones, ur: NEGATIVE mg/dL
Nitrite: POSITIVE — AB
Protein, ur: NEGATIVE mg/dL
Specific Gravity, Urine: 1.005 (ref 1.005–1.030)
pH: 6 (ref 5.0–8.0)

## 2022-04-11 LAB — CBC WITH DIFFERENTIAL/PLATELET
Abs Immature Granulocytes: 0.01 10*3/uL (ref 0.00–0.07)
Basophils Absolute: 0 10*3/uL (ref 0.0–0.1)
Basophils Relative: 0 %
Eosinophils Absolute: 0.1 10*3/uL (ref 0.0–0.5)
Eosinophils Relative: 2 %
HCT: 38.3 % (ref 36.0–46.0)
Hemoglobin: 12.9 g/dL (ref 12.0–15.0)
Immature Granulocytes: 0 %
Lymphocytes Relative: 15 %
Lymphs Abs: 0.8 10*3/uL (ref 0.7–4.0)
MCH: 31.4 pg (ref 26.0–34.0)
MCHC: 33.7 g/dL (ref 30.0–36.0)
MCV: 93.2 fL (ref 80.0–100.0)
Monocytes Absolute: 0.4 10*3/uL (ref 0.1–1.0)
Monocytes Relative: 9 %
Neutro Abs: 3.8 10*3/uL (ref 1.7–7.7)
Neutrophils Relative %: 74 %
Platelets: 194 10*3/uL (ref 150–400)
RBC: 4.11 MIL/uL (ref 3.87–5.11)
RDW: 12.9 % (ref 11.5–15.5)
WBC: 5.1 10*3/uL (ref 4.0–10.5)
nRBC: 0 % (ref 0.0–0.2)

## 2022-04-11 LAB — TROPONIN I (HIGH SENSITIVITY)
Troponin I (High Sensitivity): 3 ng/L (ref <18)
Troponin I (High Sensitivity): 4 ng/L (ref <18)

## 2022-04-11 LAB — LIPASE, BLOOD: Lipase: 37 U/L (ref 11–51)

## 2022-04-11 LAB — TSH: TSH: 1.278 u[IU]/mL (ref 0.350–4.500)

## 2022-04-11 LAB — SARS CORONAVIRUS 2 BY RT PCR: SARS Coronavirus 2 by RT PCR: NEGATIVE

## 2022-04-11 MED ORDER — MAGNESIUM HYDROXIDE 400 MG/5ML PO SUSP: 30.0000 mL | Freq: Every day | ORAL | Status: DC | PRN

## 2022-04-11 MED ORDER — LIDOCAINE-EPINEPHRINE (PF) 2 %-1:200000 IJ SOLN
INTRAMUSCULAR | Status: AC
  Filled 2022-04-11: qty 20

## 2022-04-11 MED ORDER — CARVEDILOL 3.125 MG PO TABS
3.1250 mg | ORAL_TABLET | Freq: Two times a day (BID) | ORAL | Status: DC
Start: 2022-04-12 — End: 2022-04-14
  Administered 2022-04-13: 3.125 mg via ORAL
  Filled 2022-04-11 (×2): qty 1

## 2022-04-11 MED ORDER — TRAMADOL HCL 50 MG PO TABS
50.0000 mg | ORAL_TABLET | Freq: Four times a day (QID) | ORAL | Status: DC | PRN
  Administered 2022-04-11 – 2022-04-14 (×4): 50 mg via ORAL
  Filled 2022-04-11 (×5): qty 1

## 2022-04-11 MED ORDER — MAGNESIUM OXIDE -MG SUPPLEMENT 400 (240 MG) MG PO TABS
400.0000 mg | ORAL_TABLET | Freq: Every day | ORAL | Status: DC
  Administered 2022-04-12 – 2022-04-17 (×6): 400 mg via ORAL
  Filled 2022-04-11 (×6): qty 1

## 2022-04-11 MED ORDER — ACETAMINOPHEN 325 MG PO TABS
650.0000 mg | ORAL_TABLET | Freq: Four times a day (QID) | ORAL | Status: DC | PRN
  Administered 2022-04-12 – 2022-04-16 (×5): 650 mg via ORAL
  Filled 2022-04-11 (×4): qty 2

## 2022-04-11 MED ORDER — MONTELUKAST SODIUM 10 MG PO TABS
10.0000 mg | ORAL_TABLET | Freq: Every day | ORAL | Status: DC
  Administered 2022-04-11 – 2022-04-16 (×6): 10 mg via ORAL
  Filled 2022-04-11 (×6): qty 1

## 2022-04-11 MED ORDER — POTASSIUM CHLORIDE CRYS ER 20 MEQ PO TBCR: 20.0000 meq | EXTENDED_RELEASE_TABLET | Freq: Two times a day (BID) | ORAL | Status: DC

## 2022-04-11 MED ORDER — BIOTIN 1 MG PO CAPS: 1.0000 mg | ORAL_CAPSULE | Freq: Every day | ORAL | Status: DC

## 2022-04-11 MED ORDER — TETANUS-DIPHTH-ACELL PERTUSSIS 5-2.5-18.5 LF-MCG/0.5 IM SUSY
0.5000 mL | PREFILLED_SYRINGE | Freq: Once | INTRAMUSCULAR | Status: AC
  Administered 2022-04-17: 0.5 mL via INTRAMUSCULAR
  Filled 2022-04-11: qty 0.5

## 2022-04-11 MED ORDER — SACUBITRIL-VALSARTAN 24-26 MG PO TABS
1.0000 | ORAL_TABLET | Freq: Two times a day (BID) | ORAL | Status: DC
  Administered 2022-04-11 – 2022-04-14 (×4): 1 via ORAL
  Filled 2022-04-11 (×6): qty 1

## 2022-04-11 MED ORDER — ADULT MULTIVITAMIN W/MINERALS CH
1.0000 | ORAL_TABLET | Freq: Every day | ORAL | Status: DC
  Administered 2022-04-12 – 2022-04-17 (×6): 1 via ORAL
  Filled 2022-04-11 (×6): qty 1

## 2022-04-11 MED ORDER — LORATADINE 10 MG PO TABS
10.0000 mg | ORAL_TABLET | Freq: Every day | ORAL | Status: DC
  Administered 2022-04-12 – 2022-04-17 (×6): 10 mg via ORAL
  Filled 2022-04-11 (×6): qty 1

## 2022-04-11 MED ORDER — MELATONIN 5 MG PO TABS
5.0000 mg | ORAL_TABLET | Freq: Every day | ORAL | Status: DC
  Administered 2022-04-11 – 2022-04-16 (×6): 5 mg via ORAL
  Filled 2022-04-11 (×6): qty 1

## 2022-04-11 MED ORDER — POTASSIUM CHLORIDE CRYS ER 20 MEQ PO TBCR
20.0000 meq | EXTENDED_RELEASE_TABLET | Freq: Two times a day (BID) | ORAL | Status: DC
  Administered 2022-04-12 – 2022-04-17 (×11): 20 meq via ORAL
  Filled 2022-04-11 (×11): qty 1

## 2022-04-11 MED ORDER — POLYETHYLENE GLYCOL 3350 17 GM/SCOOP PO POWD: 17.0000 g | Freq: Every day | ORAL | Status: DC | PRN

## 2022-04-11 MED ORDER — LEVOTHYROXINE SODIUM 100 MCG PO TABS
100.0000 ug | ORAL_TABLET | Freq: Every day | ORAL | Status: DC
  Administered 2022-04-12 – 2022-04-17 (×6): 100 ug via ORAL
  Filled 2022-04-11 (×6): qty 1

## 2022-04-11 MED ORDER — TORSEMIDE 20 MG PO TABS
40.0000 mg | ORAL_TABLET | Freq: Every day | ORAL | Status: DC
Start: 2022-04-12 — End: 2022-04-13
  Administered 2022-04-12: 40 mg via ORAL
  Filled 2022-04-11: qty 2

## 2022-04-11 MED ORDER — POTASSIUM CHLORIDE 20 MEQ PO PACK
40.0000 meq | PACK | Freq: Once | ORAL | Status: AC
  Administered 2022-04-11: 40 meq via ORAL
  Filled 2022-04-11: qty 2

## 2022-04-11 MED ORDER — TRAZODONE HCL 50 MG PO TABS
100.0000 mg | ORAL_TABLET | Freq: Every day | ORAL | Status: DC
  Administered 2022-04-11 – 2022-04-16 (×6): 100 mg via ORAL
  Filled 2022-04-11 (×6): qty 2

## 2022-04-11 MED ORDER — FOLIC ACID 1 MG PO TABS
500.0000 ug | ORAL_TABLET | Freq: Every day | ORAL | Status: DC
  Administered 2022-04-12 – 2022-04-17 (×6): 0.5 mg via ORAL
  Filled 2022-04-11 (×6): qty 1

## 2022-04-11 MED ORDER — ACETAMINOPHEN 650 MG RE SUPP: 650.0000 mg | Freq: Four times a day (QID) | RECTAL | Status: DC | PRN

## 2022-04-11 MED ORDER — VITAMIN B-12 1000 MCG PO TABS
1000.0000 ug | ORAL_TABLET | Freq: Every day | ORAL | Status: DC
  Administered 2022-04-12 – 2022-04-17 (×6): 1000 ug via ORAL
  Filled 2022-04-11 (×6): qty 1

## 2022-04-11 MED ORDER — PANTOPRAZOLE SODIUM 40 MG PO TBEC
40.0000 mg | DELAYED_RELEASE_TABLET | Freq: Every day | ORAL | Status: DC
  Administered 2022-04-11 – 2022-04-17 (×7): 40 mg via ORAL
  Filled 2022-04-11 (×7): qty 1

## 2022-04-11 MED ORDER — LIDOCAINE 5 % EX PTCH
1.0000 | MEDICATED_PATCH | CUTANEOUS | Status: DC
  Administered 2022-04-12 – 2022-04-17 (×6): 1 via TRANSDERMAL
  Filled 2022-04-11 (×6): qty 1

## 2022-04-11 MED ORDER — ONDANSETRON HCL 4 MG PO TABS: 4.0000 mg | ORAL_TABLET | Freq: Four times a day (QID) | ORAL | Status: DC | PRN

## 2022-04-11 MED ORDER — SODIUM CHLORIDE 0.9% FLUSH
3.0000 mL | Freq: Two times a day (BID) | INTRAVENOUS | Status: DC
  Administered 2022-04-12 – 2022-04-16 (×8): 3 mL via INTRAVENOUS

## 2022-04-11 MED ORDER — PREGABALIN 75 MG PO CAPS
75.0000 mg | ORAL_CAPSULE | Freq: Three times a day (TID) | ORAL | Status: DC
  Administered 2022-04-11 – 2022-04-17 (×18): 75 mg via ORAL
  Filled 2022-04-11 (×18): qty 1

## 2022-04-11 MED ORDER — DAPAGLIFLOZIN PROPANEDIOL 10 MG PO TABS
10.0000 mg | ORAL_TABLET | Freq: Every day | ORAL | Status: DC
Start: 2022-04-12 — End: 2022-04-17
  Administered 2022-04-12 – 2022-04-16 (×5): 10 mg via ORAL
  Filled 2022-04-11 (×6): qty 1

## 2022-04-11 MED ORDER — SODIUM CHLORIDE 0.9 % IV BOLUS: 1000.0000 mL | Freq: Once | INTRAVENOUS | Status: DC

## 2022-04-11 MED ORDER — ALPRAZOLAM 0.5 MG PO TABS: 0.5000 mg | ORAL_TABLET | Freq: Two times a day (BID) | ORAL | Status: DC | PRN

## 2022-04-11 MED ORDER — VITAMIN C 500 MG PO TABS
500.0000 mg | ORAL_TABLET | Freq: Every day | ORAL | Status: DC
  Administered 2022-04-12 – 2022-04-17 (×6): 500 mg via ORAL
  Filled 2022-04-11 (×6): qty 1

## 2022-04-11 MED ORDER — ATORVASTATIN CALCIUM 20 MG PO TABS
20.0000 mg | ORAL_TABLET | Freq: Every day | ORAL | Status: DC
  Administered 2022-04-12 – 2022-04-17 (×6): 20 mg via ORAL
  Filled 2022-04-11 (×6): qty 1

## 2022-04-11 MED ORDER — POTASSIUM CHLORIDE IN NACL 20-0.9 MEQ/L-% IV SOLN
INTRAVENOUS | Status: DC
  Filled 2022-04-11 (×2): qty 1000

## 2022-04-11 MED ORDER — CITALOPRAM HYDROBROMIDE 20 MG PO TABS
20.0000 mg | ORAL_TABLET | Freq: Every day | ORAL | Status: DC
  Administered 2022-04-12 – 2022-04-17 (×6): 20 mg via ORAL
  Filled 2022-04-11 (×6): qty 1

## 2022-04-11 MED ORDER — ONDANSETRON HCL 4 MG/2ML IJ SOLN: 4.0000 mg | Freq: Four times a day (QID) | INTRAMUSCULAR | Status: DC | PRN

## 2022-04-11 MED ORDER — TRAZODONE HCL 50 MG PO TABS: 25.0000 mg | ORAL_TABLET | Freq: Every evening | ORAL | Status: DC | PRN

## 2022-04-11 MED ORDER — SODIUM CHLORIDE 0.9 % IV SOLN
3.0000 g | Freq: Four times a day (QID) | INTRAVENOUS | Status: DC
  Administered 2022-04-11 – 2022-04-12 (×3): 3 g via INTRAVENOUS
  Filled 2022-04-11 (×5): qty 8

## 2022-04-11 MED ORDER — VITAMIN D 25 MCG (1000 UNIT) PO TABS
5000.0000 [IU] | ORAL_TABLET | Freq: Every day | ORAL | Status: DC
  Administered 2022-04-12 – 2022-04-17 (×6): 5000 [IU] via ORAL
  Filled 2022-04-11 (×6): qty 5

## 2022-04-11 NOTE — Assessment & Plan Note (Addendum)
K normalized after replacment. Monitor BMP, replace K for goal > 4.0

## 2022-04-11 NOTE — Assessment & Plan Note (Addendum)
--  continue Xanax and Lexapro

## 2022-04-11 NOTE — Assessment & Plan Note (Addendum)
Presented hypotensive and having symptomatic orthostatic hypotension. BP-lowering meds are indicated for cardiac issues. --Continue low dose Coreg, Entresto, torsemide --Started on midodrine --Torsemide reduced to 20 mg daily

## 2022-04-11 NOTE — ED Notes (Signed)
Pt brief and bedding wet, pt cleaned and bedding and brief changed, purewic placed.

## 2022-04-11 NOTE — H&P (Addendum)
[place]   PATIENT NAME: Kimberly Cook    MR#:  [id number]  DATE OF BIRTH:  11/02/1945  DATE OF ADMISSION:  04/11/2022  PRIMARY CARE PHYSICIAN: System, Provider Not In   Patient is coming from: Home  REQUESTING/REFERRING PHYSICIAN: Marjean Donna, MD  CHIEF COMPLAINT:   Chief Complaint  Patient presents with   Fall    HISTORY OF PRESENT ILLNESS:  Kimberly Cook is a 76 y.o. Caucasian female with medical history significant for   anxiety, osteoarthritis, chronic HFrEF, stage 3A CKD, CAD, DDD, depression, essential hypertension, GERD, hypothyroidism and dyslipidemia, who presented to the ER with acute onset of syncope with subsequent fall.  The patient was found on the ground and was seen not to trip before her fall.  She has not been feeling well lately and has been somnolent.  A UA was positive for UTI yesterday.  She subsequently sustained a right periorbital hematoma and a laceration that was repaired in the ER.  No reported chest pain or palpitations.  No reported paresthesias or focal muscle weakness.  No tongue bites or urinary or stool incontinence.  No witnessed seizures.  No reported nausea or vomiting or diarrhea or abdominal pain.  She was just treated with 5 days of p.o. Bactrim before yesterday and underwent flexible cystoscopy by Dr. Erlene Quan yesterday revealing bladder wall thickening and bladder erythema with differential diagnosis including CIS versus chronic cystitis from bacterial colonization.  Recommendation was made for consideration of cystoscopy with bladder biopsy in the OR and the plan for preoperative urine culture.  The patient's urine culture from 8/21 revealed E. coli with susceptibility consistent with probable ESBL with resistance to cephalosporins, Levaquin and ampicillin but sensitive to Bactrim, imipenem, ertapenem, Zosyn as well as Unasyn and gentamicin.  ED Course: When she came to the ER, BP was 99/70 with a heart rate of 72 and otherwise  normal vital signs.  Labs revealed mild hypokalemia 3.3 and a creatinine 1.13 at baseline with calcium of 8.7 otherwise unremarkable CMP.  High sensitive troponin was 3.  CBC was within normal.  TSH was 1.27 and free T4: 1.6 that is slightly elevated.  COVID-19 PCR came back negative EKG as reviewed by me : EKG showed normal sinus arrhythmia with a rate of 73 with T wave inversion anteroseptally and laterally. Imaging: Portable chest x-ray showed no acute cardiopulmonary disease.Noncontrasted CT scan showed no acute intracranial normalities and no acute displaced facial fracture and C-spine CT showed no acute displaced fracture or traumatic listhesis of the cervical spine.  Maxillofacial CT showed a right frontal scalp 1 cm hematoma with associated slight hyperdensity measuring 6 except 3 mm that could have presented retained radiopaque foreign body.  The patient had her laceration repaired as mentioned above.  She will be admitted to an observation medical telemetry bed for further evaluation and monitoring. PAST MEDICAL HISTORY:   Past Medical History:  Diagnosis Date   Allergy    Anxiety    Arthritis    Back pain    Chronic HFrEF (heart failure with reduced ejection fraction) ([place])    a. 08/2021 echo: EF 30-35%, glob HK, GrI DD; b. 10/2021 Echo: EF 30-35%.   CKD (chronic kidney disease), stage II    Coronary artery disease    a. Mild to moderate CAD in LAD/diagonal by CTA (CT-FFR of apical LAD 0.79); b. 08/2021 Cath: LM nl, LAD min irregs, D1/2/3 nl, LCX nl, OM2/3 nl, RCA 20p, RPDA mild dzs, RPAV  nl.   DDD (degenerative disc disease), lumbar    Depression    Diverticulosis    Essential hypertension    GERD (gastroesophageal reflux disease)    Headache    History of shingles    Hyperlipidemia    Hypothyroidism    Lung nodule    Mini stroke 2011   Moderate Pericardial effusion    a. 08/2021 Echo: moderate circumferential pericardial effusion w/o tamponade; b. 10/2021 Echo: EF 30-35%, small  to mod circumferential pericardial effusion w/o tamponade.   NICM (nonischemic cardiomyopathy) ([place])    a. 08/2021 Echo: EF 30-35%, glob HK, GrI DD, nl RV fxn, mild-mod dil LA, mod circumferential pericardial eff w/o tamponade, Mod MR; b. 10/2021 Echo: EF 30-35%, glob HK.   Occipital neuralgia    Palpitations    Pleural effusion on left    a. 08/2021 s/p thoracentesis.   Pneumonia 2018   Prediabetes    Stroke [facility])    Stroke ([place])    MRI 04/2008 + left sup. frontal gyrus possibly puntate infarct    Syncope 2019   Urinary tract infection    Vitamin D deficiency     PAST SURGICAL HISTORY:   Past Surgical History:  Procedure Laterality Date   ABDOMINAL HYSTERECTOMY     BLADDER SURGERY     2003   BREAST EXCISIONAL BIOPSY Right Over 20 years    Benign   CHOLECYSTECTOMY     gastroplication      JOINT REPLACEMENT Left    KNEE   KNEE ARTHROSCOPY Left 2011   PULSE GENERATOR IMPLANT Left 01/31/2020   Procedure: PLACEMENT RIGHT FLANK PULSE GENERATOR VS REMOVAL SPINAL CORD STIMULATOR;  Surgeon: Deetta Perla, MD;  Location: [facility] ORS;  Service: Neurosurgery;  Laterality: Left;   PULSE GENERATOR IMPLANT Left 04/24/2020   Procedure: REPLACEMENT LEFT FLANK PULSE GENERATOR IMPLANT;  Surgeon: Deetta Perla, MD;  Location: [facility] ORS;  Service: Neurosurgery;  Laterality: Left;  MAC w/ local   right arm fracture     RIGHT/LEFT HEART CATH AND CORONARY ANGIOGRAPHY N/A 08/28/2021   Procedure: RIGHT/LEFT HEART CATH AND CORONARY ANGIOGRAPHY;  Surgeon: Wellington Hampshire, MD;  Location: [facility];  Service: Cardiovascular;  Laterality: N/A;   SPINAL CORD STIMULATOR REMOVAL N/A 06/26/2020   Procedure: SPINAL CORD STIMULATOR REMOVAL;  Surgeon: Deetta Perla, MD;  Location: [facility] ORS;  Service: Neurosurgery;  Laterality: N/A;   THORACIC LAMINECTOMY FOR SPINAL CORD STIMULATOR N/A 01/24/2020   Procedure: THORACIC SPINAL CORD STIMULATOR PADDLE TRIAL VIA LAMINECTOMY;  Surgeon: Deetta Perla, MD;  Location: [facility]  ORS;  Service: Neurosurgery;  Laterality: N/A;   TONSILLECTOMY AND ADENOIDECTOMY     TOTAL KNEE ARTHROPLASTY Left 04/17/2015   Procedure: LEFT TOTAL KNEE ARTHROPLASTY;  Surgeon: Paralee Cancel, MD;  Location: [facility];  Service: Orthopedics;  Laterality: Left;    SOCIAL HISTORY:   Social History   Tobacco Use   Smoking status: Never    Passive exposure: Yes   Smokeless tobacco: Never   Tobacco comments:    husbands and children smoked in home.   Substance Use Topics   Alcohol use: No    FAMILY HISTORY:   Family History  Problem Relation Age of Onset   Heart disease Mother    Hypertension Mother    Diabetes Mother    Heart attack Mother 10   Heart disease Father    Heart attack Father 52   Breast cancer Maternal Aunt    Heart attack Brother     DRUG  ALLERGIES:  No Known Allergies  REVIEW OF SYSTEMS:   ROS As per history of present illness. All pertinent systems were reviewed above. Constitutional, HEENT, cardiovascular, respiratory, GI, GU, musculoskeletal, neuro, psychiatric, endocrine, integumentary and hematologic systems were reviewed and are otherwise negative/unremarkable except for positive findings mentioned above in the HPI.   MEDICATIONS AT HOME:   Prior to Admission medications   Medication Sig Start Date End Date Taking? Authorizing Provider  acetaminophen (TYLENOL) 500 MG tablet Take 1,000 mg by mouth every 8 (eight) hours as needed for moderate pain.    [provider]  alendronate (FOSAMAX) 70 MG tablet Take 70 mg by mouth once a week.  10/23/19   [provider]  ALPRAZolam Duanne Moron) 0.5 MG tablet Take 1 tablet (0.5 mg total) by mouth 2 (two) times daily as needed for anxiety. 03/06/22   Loletha Grayer, MD  ascorbic acid (VITAMIN C) 500 MG tablet Take 500 mg by mouth daily.    [provider]  aspirin EC 81 MG tablet Take 1 tablet (81 mg total) by mouth at bedtime. Swallow whole. 10/25/20   McLean-Scocuzza, Nino Glow, MD  atorvastatin  (LIPITOR) 20 MG tablet Take 1 tablet (20 mg total) by mouth daily. 06/20/20   McLean-Scocuzza, Nino Glow, MD  Biotin 1 MG CAPS Take 1 mg by mouth daily.     [provider]  carvedilol (COREG) 3.125 MG tablet Take 1 tablet (3.125 mg total) by mouth 2 (two) times daily with a meal. Hold for blood pressure less than 90/60 01/04/22   End, Harrell Gave, MD  cetirizine (ZYRTEC) 5 MG tablet Take 5 mg by mouth daily.    [provider]  Cholecalciferol (VITAMIN D3) 125 MCG (5000 UT) TABS Take 1 tablet (5,000 Units total) by mouth daily. 10/09/18   McLean-Scocuzza, Nino Glow, MD  citalopram (CELEXA) 20 MG tablet Take 20 mg by mouth daily. 01/10/22   [provider]  Cranberry 450 MG TABS Take 1 tablet by mouth every morning. 01/03/22   [provider]  dapagliflozin propanediol (FARXIGA) 10 MG TABS tablet Take 1 tablet (10 mg total) by mouth daily before breakfast. 09/11/21   Theora Gianotti, NP  folic acid (FOLVITE) 299 MCG tablet Take 1 tablet (400 mcg total) by mouth daily. SEPARATE ALL SUPPLEMENTS TO LUNCH OR DINNER AND PRILOSEC NOT TO MESS W/THYROID MED 12/10/18   McLean-Scocuzza, Nino Glow, MD  levothyroxine (SYNTHROID) 100 MCG tablet Take 1 tablet (100 mcg total) by mouth daily before breakfast. 03/06/22   Loletha Grayer, MD  lidocaine 4 % Place 1 patch onto the skin daily. Remove & Discard patch within 12 hours or as directed by MD 03/06/22   Loletha Grayer, MD  magnesium oxide (MAG-OX) 400 (240 Mg) MG tablet Take 1 tablet (400 mg total) by mouth daily. 08/31/21   Nicole Kindred A, DO  melatonin 5 MG TABS Take 1 tablet (5 mg total) by mouth at bedtime. 04/04/21   Val Riles, MD  montelukast (SINGULAIR) 10 MG tablet Take 1 tablet (10 mg total) by mouth at bedtime. 02/14/20   McLean-Scocuzza, Nino Glow, MD  Multiple Vitamin (MULTIVITAMIN WITH MINERALS) TABS tablet Take 1 tablet by mouth daily.    [provider]  omeprazole (PRILOSEC) 40 MG capsule Take 1 capsule (40  mg total) by mouth daily. After lunch 06/23/20   McLean-Scocuzza, Nino Glow, MD  polyethylene glycol powder (GLYCOLAX/MIRALAX) 17 GM/SCOOP powder Take 17 g by mouth daily as needed for moderate constipation. 10/21/19  McLean-Scocuzza, Nino Glow, MD  potassium chloride SA (KLOR-CON M) 20 MEQ tablet Take 1 tablet (20 mEq total) by mouth 2 (two) times daily. 08/30/21   Ezekiel Slocumb, DO  pregabalin (LYRICA) 75 MG capsule Take 75 mg by mouth 3 (three) times daily.    [provider]  sacubitril-valsartan (ENTRESTO) 24-26 MG Take 1 tablet by mouth 2 (two) times daily. 03/28/22   Alisa Graff, FNP  torsemide (DEMADEX) 20 MG tablet Take 40 mg by mouth daily.    [provider]  traMADol (ULTRAM) 50 MG tablet Take 1 tablet (50 mg total) by mouth every 6 (six) hours as needed for moderate pain. 03/06/22   Loletha Grayer, MD  traZODone (DESYREL) 100 MG tablet Take 100 mg by mouth at bedtime. 12/13/21   [provider]  vitamin B-12 (CYANOCOBALAMIN) 1000 MCG tablet Take 1 tablet (1,000 mcg total) by mouth daily. 10/09/18   McLean-Scocuzza, Nino Glow, MD      VITAL SIGNS:  Blood pressure 119/86, pulse 79, temperature 97.9 F (36.6 C), temperature source Oral, resp. rate 16, height _0  (1.626 m), weight 81.9 kg, SpO2 100 %.  PHYSICAL EXAMINATION:  Physical Exam  GENERAL:  76 y.o.-year-old Caucasian female patient lying in the bed with no acute distress.  EYES: Pupils equal, round, reactive to light and accommodation. No scleral icterus. Extraocular muscles intact.  HEENT: Head  normocephalic with right periorbital and forehead contusion as described below. Oropharynx and nasopharynx clear.  NECK:  Supple, no jugular venous distention. No thyroid enlargement, no tenderness.  LUNGS: Normal breath sounds bilaterally, no wheezing, rales,rhonchi or crepitation. No use of accessory muscles of respiration.  CARDIOVASCULAR: Regular rate and rhythm, S1, S2 normal. No murmurs, rubs, or  gallops.  ABDOMEN: Soft, nondistended, nontender. Bowel sounds present. No organomegaly or mass.  EXTREMITIES: No pedal edema, cyanosis, or clubbing.  NEUROLOGIC: Cranial nerves II through XII are intact. Muscle strength 5/5 in all extremities. Sensation intact. Gait not checked.  PSYCHIATRIC: The patient is alert and oriented x 3.  Normal affect and good eye contact. SKIN: Right periorbital and forehead contusion with laceration s/p repair with adequate hemostasis.   LABORATORY PANEL:   CBC Recent Labs  Lab 04/11/22 1851  WBC 5.1  HGB 12.9  HCT 38.3  PLT 194   ------------------------------------------------------------------------------------------------------------------  Chemistries  Recent Labs  Lab 04/11/22 1851  NA 139  K 3.3*  CL 102  CO2 27  GLUCOSE 111*  BUN 23  CREATININE 1.13*  CALCIUM 8.7*  AST 23  ALT 16  ALKPHOS 74  BILITOT 0.7   ------------------------------------------------------------------------------------------------------------------  Cardiac Enzymes No results for input(s): "TROPONINI" in the last 168 hours. ------------------------------------------------------------------------------------------------------------------  RADIOLOGY:  CT HEAD WO CONTRAST (5MM)  Addendum Date: 04/11/2022   ADDENDUM REPORT: 04/11/2022 19:59 ADDENDUM: Silver nitrate used on bleeding right scalp laceration patient prior to CT-visualized hyperdensity within the soft tissue defect likely correlates with silver nitrate usage. Correlate with physical exam. These results were called by telephone at the time of interpretation on 04/11/2022 at 7:58 pm to provider [facility] , who verbally acknowledged these results. Electronically Signed   By: Iven Finn M.D.   On: 04/11/2022 19:59   Result Date: 04/11/2022 CLINICAL DATA:  Head trauma, minor (Age >= 65y); Neck trauma (Age >= 65y); Facial trauma, blunt EXAM: CT HEAD WITHOUT CONTRAST CT MAXILLOFACIAL WITHOUT CONTRAST CT  CERVICAL SPINE WITHOUT CONTRAST TECHNIQUE: Multidetector CT imaging of the head, cervical spine, and maxillofacial structures were performed using the  standard protocol without intravenous contrast. Multiplanar CT image reconstructions of the cervical spine and maxillofacial structures were also generated. RADIATION DOSE REDUCTION: This exam was performed according to the departmental dose-optimization program which includes automated exposure control, adjustment of the mA and/or kV according to patient size and/or use of iterative reconstruction technique. COMPARISON:  None Available. FINDINGS: CT HEAD FINDINGS BRAIN: BRAIN Cerebral ventricle sizes are concordant with the degree of cerebral volume loss. Patchy and confluent areas of decreased attenuation are noted throughout the deep and periventricular white matter of the cerebral hemispheres bilaterally, compatible with chronic microvascular ischemic disease. No evidence of large-territorial acute infarction. No parenchymal hemorrhage. No mass lesion. No extra-axial collection. No mass effect or midline shift. No hydrocephalus. Basilar cisterns are patent. Vascular: No hyperdense vessel. Skull: No acute fracture or focal lesion. Other: Right frontal scalp 1 cm hematoma formation. Associated slight hyperdensity measuring 6 x 3 mm that could represent a retained radiopaque foreign body. 2 mm metallic density overlying the soft tissues of the right base (3:5). No definite retained metallic foreign body within the soft tissues. CT MAXILLOFACIAL FINDINGS Osseous: No fracture or mandibular dislocation. No destructive process. Temporomandibular joint degenerative changes. Patient is edentulous. Sinuses/Orbits: Paranasal sinuses and mastoid air cells are clear. The orbits are unremarkable. Soft tissues: Negative. CT CERVICAL SPINE FINDINGS Alignment: Normal. Skull base and vertebrae: Partially visualized thoracic surgical hardware. Multilevel moderate degenerative  changes of the spine with associated moderate osseous neural foraminal stenosis. No severe osseous central canal stenosis or neural foraminal stenosis. No acute fracture. No aggressive appearing focal osseous lesion or focal pathologic process. Soft tissues and spinal canal: No prevertebral fluid or swelling. No visible canal hematoma. Upper chest: Unremarkable. Other: None. IMPRESSION: 1. No acute intracranial abnormality. 2. No acute displaced facial fracture. 3. No acute displaced fracture or traumatic listhesis of the cervical spine. 4. Right frontal scalp 1 cm hematoma formation. Associated slight hyperdensity measuring 6 x 3 mm that could represent a retained radiopaque foreign body. Electronically Signed: By: Iven Finn M.D. On: 04/11/2022 19:08   CT Cervical Spine Wo Contrast  Addendum Date: 04/11/2022   ADDENDUM REPORT: 04/11/2022 19:59 ADDENDUM: Silver nitrate used on bleeding right scalp laceration patient prior to CT-visualized hyperdensity within the soft tissue defect likely correlates with silver nitrate usage. Correlate with physical exam. These results were called by telephone at the time of interpretation on 04/11/2022 at 7:58 pm to provider [facility] , who verbally acknowledged these results. Electronically Signed   By: Iven Finn M.D.   On: 04/11/2022 19:59   Result Date: 04/11/2022 CLINICAL DATA:  Head trauma, minor (Age >= 65y); Neck trauma (Age >= 65y); Facial trauma, blunt EXAM: CT HEAD WITHOUT CONTRAST CT MAXILLOFACIAL WITHOUT CONTRAST CT CERVICAL SPINE WITHOUT CONTRAST TECHNIQUE: Multidetector CT imaging of the head, cervical spine, and maxillofacial structures were performed using the standard protocol without intravenous contrast. Multiplanar CT image reconstructions of the cervical spine and maxillofacial structures were also generated. RADIATION DOSE REDUCTION: This exam was performed according to the departmental dose-optimization program which includes automated exposure  control, adjustment of the mA and/or kV according to patient size and/or use of iterative reconstruction technique. COMPARISON:  None Available. FINDINGS: CT HEAD FINDINGS BRAIN: BRAIN Cerebral ventricle sizes are concordant with the degree of cerebral volume loss. Patchy and confluent areas of decreased attenuation are noted throughout the deep and periventricular white matter of the cerebral hemispheres bilaterally, compatible with chronic microvascular ischemic disease. No evidence of large-territorial acute infarction. No  parenchymal hemorrhage. No mass lesion. No extra-axial collection. No mass effect or midline shift. No hydrocephalus. Basilar cisterns are patent. Vascular: No hyperdense vessel. Skull: No acute fracture or focal lesion. Other: Right frontal scalp 1 cm hematoma formation. Associated slight hyperdensity measuring 6 x 3 mm that could represent a retained radiopaque foreign body. 2 mm metallic density overlying the soft tissues of the right base (3:5). No definite retained metallic foreign body within the soft tissues. CT MAXILLOFACIAL FINDINGS Osseous: No fracture or mandibular dislocation. No destructive process. Temporomandibular joint degenerative changes. Patient is edentulous. Sinuses/Orbits: Paranasal sinuses and mastoid air cells are clear. The orbits are unremarkable. Soft tissues: Negative. CT CERVICAL SPINE FINDINGS Alignment: Normal. Skull base and vertebrae: Partially visualized thoracic surgical hardware. Multilevel moderate degenerative changes of the spine with associated moderate osseous neural foraminal stenosis. No severe osseous central canal stenosis or neural foraminal stenosis. No acute fracture. No aggressive appearing focal osseous lesion or focal pathologic process. Soft tissues and spinal canal: No prevertebral fluid or swelling. No visible canal hematoma. Upper chest: Unremarkable. Other: None. IMPRESSION: 1. No acute intracranial abnormality. 2. No acute displaced  facial fracture. 3. No acute displaced fracture or traumatic listhesis of the cervical spine. 4. Right frontal scalp 1 cm hematoma formation. Associated slight hyperdensity measuring 6 x 3 mm that could represent a retained radiopaque foreign body. Electronically Signed: By: Iven Finn M.D. On: 04/11/2022 19:08   CT Maxillofacial Wo Contrast  Addendum Date: 04/11/2022   ADDENDUM REPORT: 04/11/2022 19:59 ADDENDUM: Silver nitrate used on bleeding right scalp laceration patient prior to CT-visualized hyperdensity within the soft tissue defect likely correlates with silver nitrate usage. Correlate with physical exam. These results were called by telephone at the time of interpretation on 04/11/2022 at 7:58 pm to provider [facility] , who verbally acknowledged these results. Electronically Signed   By: Iven Finn M.D.   On: 04/11/2022 19:59   Result Date: 04/11/2022 CLINICAL DATA:  Head trauma, minor (Age >= 65y); Neck trauma (Age >= 65y); Facial trauma, blunt EXAM: CT HEAD WITHOUT CONTRAST CT MAXILLOFACIAL WITHOUT CONTRAST CT CERVICAL SPINE WITHOUT CONTRAST TECHNIQUE: Multidetector CT imaging of the head, cervical spine, and maxillofacial structures were performed using the standard protocol without intravenous contrast. Multiplanar CT image reconstructions of the cervical spine and maxillofacial structures were also generated. RADIATION DOSE REDUCTION: This exam was performed according to the departmental dose-optimization program which includes automated exposure control, adjustment of the mA and/or kV according to patient size and/or use of iterative reconstruction technique. COMPARISON:  None Available. FINDINGS: CT HEAD FINDINGS BRAIN: BRAIN Cerebral ventricle sizes are concordant with the degree of cerebral volume loss. Patchy and confluent areas of decreased attenuation are noted throughout the deep and periventricular white matter of the cerebral hemispheres bilaterally, compatible with chronic  microvascular ischemic disease. No evidence of large-territorial acute infarction. No parenchymal hemorrhage. No mass lesion. No extra-axial collection. No mass effect or midline shift. No hydrocephalus. Basilar cisterns are patent. Vascular: No hyperdense vessel. Skull: No acute fracture or focal lesion. Other: Right frontal scalp 1 cm hematoma formation. Associated slight hyperdensity measuring 6 x 3 mm that could represent a retained radiopaque foreign body. 2 mm metallic density overlying the soft tissues of the right base (3:5). No definite retained metallic foreign body within the soft tissues. CT MAXILLOFACIAL FINDINGS Osseous: No fracture or mandibular dislocation. No destructive process. Temporomandibular joint degenerative changes. Patient is edentulous. Sinuses/Orbits: Paranasal sinuses and mastoid air cells are clear. The orbits are unremarkable.  Soft tissues: Negative. CT CERVICAL SPINE FINDINGS Alignment: Normal. Skull base and vertebrae: Partially visualized thoracic surgical hardware. Multilevel moderate degenerative changes of the spine with associated moderate osseous neural foraminal stenosis. No severe osseous central canal stenosis or neural foraminal stenosis. No acute fracture. No aggressive appearing focal osseous lesion or focal pathologic process. Soft tissues and spinal canal: No prevertebral fluid or swelling. No visible canal hematoma. Upper chest: Unremarkable. Other: None. IMPRESSION: 1. No acute intracranial abnormality. 2. No acute displaced facial fracture. 3. No acute displaced fracture or traumatic listhesis of the cervical spine. 4. Right frontal scalp 1 cm hematoma formation. Associated slight hyperdensity measuring 6 x 3 mm that could represent a retained radiopaque foreign body. Electronically Signed: By: Iven Finn M.D. On: 04/11/2022 19:08   DG Chest Portable 1 View  Result Date: 04/11/2022 CLINICAL DATA:  Fall EXAM: PORTABLE CHEST 1 VIEW COMPARISON:  01/11/2022  FINDINGS: The heart size and mediastinal contours are within normal limits. Both lungs are clear. Posterior spinal hardware. IMPRESSION: No active disease. Electronically Signed   By: Donavan Foil M.D.   On: 04/11/2022 18:40      IMPRESSION AND PLAN:  Assessment and Plan: * Syncope - The patient will be admitted to an observation medically monitored bed. - Will check orthostatics q 12 hours. - Will obtain a bilateral carotid Doppler. - Most recent 2D echo revealed an EF of 45 to 50% with indeterminate diastolic function. - The patient will be gently hydrated with IV normal saline and monitored for arrhythmias. -Differential diagnoses would include neurally mediated syncope, cardiogenic, arrhythmias related,  orthostatic hypotension and less likely hypoglycemia.     Recurrent UTI - While differential diagnosis includes CIS and chronic cystitis from colonization and plan was for cystoscopy and bladder biopsy, given her syncope and generalized weakness, as well as altered mental status with somnolence, I will place her on IV Unasyn while she is here. - She grew ESBL E. coli as mentioned above on 8/21 and her urinalysis and urine culture from today are pending.  She had a positive UA yesterday though.  Hypokalemia  - We will replace potassium and check magnesium level.  Peripheral neuropathy - We will continue her Lyrica.  HFrEF (heart failure with reduced ejection fraction) ([place]) - Continue her Coreg, Entresto and [place].  CKD Stage IIIa - We will gently hydrate her and follow BMP. - Her creatinine is currently at baseline.  Anxiety and depression - We will continue her Xanax and Lexapro.  Essential hypertension - We will continue her antihypertensives.  GERD (gastroesophageal reflux disease) - We will continue PPI therapy.  Hyperlipidemia - We will continue her statin therapy   DVT prophylaxis: Lovenox.  Advanced Care Planning:  Code Status: DNR/DNI Family  Communication:  The plan of care was discussed in details with the patient (and family). I answered all questions. The patient agreed to proceed with the above mentioned plan. Further management will depend upon hospital course. Disposition Plan: Back to previous home environment Consults called: none.  All the records are reviewed and case discussed with ED provider.  Status is: Observation   I certify that at the time of admission, it is my clinical judgment that the patient will require hospital care extending less than 2 midnights.                            Dispo: The patient is from: Home  Anticipated d/c is to: Home              Patient currently is not medically stable to d/c.              Difficult to place patient: No  Christel Mormon M.D on 04/12/2022 at 12:21 AM  [facility]   From 7 PM-7 AM, contact night-coverage [email or web address]  CC: Primary care physician; System, Provider Not In

## 2022-04-11 NOTE — Assessment & Plan Note (Addendum)
continue PPI

## 2022-04-11 NOTE — Assessment & Plan Note (Addendum)
Due to orthostatic hypotension.   Echo 03/01/22 showed LVEF 45 to 50% with indeterminate diastolic function. Carotid doppler U/S negative for significant stenosis of ICA's bilaterally.  9/8: positive orthostatic vitals and BP's overall soft at rest 9/9: still low BP's and dizzy when upright 9/10-11: resting BP"s 80's/50's, ongoing orthostatic symptoms 9/12: resting BP improved but still +orthostatics --orthostatic vitals at least daily --continue midodrine 10 mg TID --changes to CHF meds as outlined below --telemetry monitoring

## 2022-04-11 NOTE — ED Provider Notes (Addendum)
[facility] Provider Note    Event Date/Time   First MD Initiated Contact with Patient 04/11/22 1747     (approximate)   History   Fall   HPI  Kimberly Cook is a 76 y.o. female with hypertension, anxiety, depression, who comes in from [facility] due to increased sleepiness.  Patient reports feeling more sleepy this afternoon.  She reports having a fall but unclear why she fell.  She did report hitting the front of her head.  She does report having a cystoscopy done yesterday and sleeping really well overnight so unsure what participated this event.  She denies any chest pain, shortness of breath, abdominal pain.  She reports pain above her right eye. Physical Exam   Triage Vital Signs: Blood pressure 99/70, pulse 72, temperature 98.7 F (37.1 C), temperature source Oral, resp. rate 18, height 5\' 4"  (1.626 m), weight 83.9 kg, SpO2 99 %.  Most recent vital signs: Vitals:   04/11/22 1854  BP: 99/70  Pulse: 72  Resp: 18  Temp: 98.7 F (37.1 C)  SpO2: 99%     General: Awake, no distress.  CV:  Good peripheral perfusion.  Resp:  Normal effort.  Abd:  No distention.  Other:  Able to lift both legs up off the bed.  Able to move her arms without any tenderness.  Chest wall without any tenderness abdominal wall without any tenderness.  She is got hematoma noted above the right eye with a laceration noted.  Her pupils are reactive extraocular movements are intact.  She denies any new back pain.   ED Results / Procedures / Treatments   Labs (all labs ordered are listed, but only abnormal results are displayed) Labs Reviewed  COMPREHENSIVE METABOLIC PANEL - Abnormal; Notable for the following components:      Result Value   Potassium 3.3 (*)    Glucose, Bld 111 (*)    Creatinine, Ser 1.13 (*)    Calcium 8.7 (*)    GFR, Estimated 50 (*)    All other components within normal limits  SARS CORONAVIRUS 2 BY RT PCR  CBC WITH DIFFERENTIAL/PLATELET   LIPASE, BLOOD  URINALYSIS, ROUTINE W REFLEX MICROSCOPIC  TSH  T4, FREE  TROPONIN I (HIGH SENSITIVITY)  TROPONIN I (HIGH SENSITIVITY)     EKG  My interpretation of EKG:  Normal sinus rate of 73, no st elevation, t wave inversion in v2, normal intervals   RADIOLOGY I have reviewed the ct head personally and interpretted no ich    PROCEDURES:  Critical Care performed: No  ..Laceration Repair  Date/Time: 04/11/2022 7:14 PM  Performed by: 06/11/2022, MD Authorized by: Concha Se, MD   Consent:    Consent obtained:  Verbal   Consent given by:  Patient   Risks discussed:  Infection, pain, retained foreign body, need for additional repair, poor cosmetic result, tendon damage, vascular damage, nerve damage and poor wound healing   Alternatives discussed:  No treatment Universal protocol:    Patient identity confirmed:  Verbally with patient Laceration details:    Location:  Face   Face location:  Forehead   Length (cm):  5   Depth (mm):  1 Pre-procedure details:    Preparation:  Patient was prepped and draped in usual sterile fashion Exploration:    Limited defect created (wound extended): no     Hemostasis achieved with:  Direct pressure and epinephrine (silver nitrate)   Imaging outcome: foreign body  not noted     Contaminated: no   Treatment:    Area cleansed with:  Saline   Amount of cleaning:  Standard   Debridement:  None Skin repair:    Repair method:  Sutures   Suture size:  6-0   Suture technique:  Simple interrupted   Number of sutures:  6 Approximation:    Approximation:  Close Repair type:    Repair type:  Simple Post-procedure details:    Procedure completion:  Tolerated well, no immediate complications    MEDICATIONS ORDERED IN ED: Medications - No data to display   IMPRESSION / MDM / ASSESSMENT AND PLAN / ED COURSE  I reviewed the triage vital signs and the nursing notes.   Patient's presentation is most consistent with acute  presentation with potential threat to life or bodily function.   Differential includes intercranial hemorrhage, cervical fracture, facial fracture.  Regarding patient's sleepiness will get labs to evaluate for dehydration, ACS, UTI.,  Thyroid dysfunction.  I attempted to try to drain the hematoma but she was having a lot of discomfort and I was having difficulty trying to expel the hematoma therefore I elected to just repair it.  She did have a lot of venous bleeding that slowed down with the epinephrine and direct pressure but there was 1 small little area that I did use silver nitrate on with resolution of bleeding.  Patient was sutured and a compression bandage was placed on her forehead.  CT imaging of the head and neck and face were negative but there was a noted hematoma formation on the right scalp with some hyperdensity noted that could be retained foreign body.  I suspect this could just be the silver nitrate that I use we will discuss with rads   Chest xray negative  Initial troponin is negative.  CMP is reassuring.  Kidney function is around baseline.  Potassium is slightly low we will give some oral repletion.  Patient will be handed off to the oncoming team pending urine, troponin.  I attempted to call radiology twice to discuss this potential retained foreign body.  I have looked it up online and does appear that silver nitrate does cause radiopaque on CT imaging so I do feel like this is most likely related to that.  I will have the oncoming PA tried to call radiology again just to ensure but at this time I do not see any foreign bodies and I do not feel that we need to open this up and I discussed this with family and they are agreeable.  Also get a repeat EKG given the first 1 is current hard to tell if it is sinus or not.  I was able to get a hold of radiology and there is no evidence  Patient feels very anxious about going home.  She feels very weak.  Given this was possible  syncopal episode and patient's age patient would prefer to be monitored overnight in the hospital and work with physical therapy before being discharged back to facility   Clinical Course as of 04/11/22 1946  Thu Apr 11, 2022  1944 CT HEAD WO CONTRAST ( ) [MF]    Clinical Course User Index [MF] Concha Se, MD     FINAL CLINICAL IMPRESSION(S) / ED DIAGNOSES   Final diagnoses:  Fall, initial encounter  Syncope and collapse  Laceration of scalp without foreign body, initial encounter     Rx / DC Orders   ED Discharge Orders  None        Note:  This document was prepared using Dragon voice recognition software and may include unintentional dictation errors.   Concha Se, MD 04/11/22 2008    Concha Se, MD 04/11/22 2008

## 2022-04-11 NOTE — Assessment & Plan Note (Addendum)
continue Lyrica

## 2022-04-11 NOTE — ED Triage Notes (Signed)
PT bib EMS from [facility], fell walking back to bed, laceration to right side of forehead, pt denies LOC and denies blood thinners, A&Ox4. Pt denies dizziness. Bleeding controlled. Per EMS pt had steady stream of blood on arrival but was not pulsating. Pt states head pain 10/10.

## 2022-04-11 NOTE — Assessment & Plan Note (Addendum)
Pt followed by urology closely, just seen day before for in office cysto. Differential diagnosis for her abnormal UA includes CIS and chronic cystitis from colonization.   Started on Unasyn on admission  given her syncope and generalized weakness, as well as altered mental status with somnolence. --Stop antibiotics --Suspect presenting symptoms are due to hypotension and orthostatic in nature. --Monitor fever curve, CBC and for symptoms of UTI --If we need to treat, would use meropenem given ESBL E. Coli on her very recent urine culture.

## 2022-04-11 NOTE — Assessment & Plan Note (Addendum)
continue statin

## 2022-04-11 NOTE — Assessment & Plan Note (Addendum)
Pt was changed from losartan to Entresto around 03/28/22.  This seems to have dramatically impacted her BP. --D/C Entresto --Changed Coreg to low dose Toprol --Hold torsemide for now, reduced dose to 20 mg --Cardiology consulted, appreciate input --Resume low dose losartan 12.5 mg when BP will tolerate --Monitor volume status closely (250 cc bolus today for hypotension / orthostasis) --Daily weights

## 2022-04-11 NOTE — Assessment & Plan Note (Addendum)
Given gentle IV hydration on admission. Renal function at baseline --Monitor BMP

## 2022-04-12 ENCOUNTER — Observation Stay: Payer: Medicare HMO

## 2022-04-12 ENCOUNTER — Encounter: Payer: Self-pay | Admitting: Family Medicine

## 2022-04-12 DIAGNOSIS — H539 Unspecified visual disturbance: Secondary | ICD-10-CM | POA: Diagnosis not present

## 2022-04-12 DIAGNOSIS — I5020 Unspecified systolic (congestive) heart failure: Secondary | ICD-10-CM | POA: Diagnosis not present

## 2022-04-12 DIAGNOSIS — N390 Urinary tract infection, site not specified: Secondary | ICD-10-CM | POA: Diagnosis not present

## 2022-04-12 DIAGNOSIS — I10 Essential (primary) hypertension: Secondary | ICD-10-CM | POA: Diagnosis not present

## 2022-04-12 DIAGNOSIS — E876 Hypokalemia: Secondary | ICD-10-CM | POA: Diagnosis not present

## 2022-04-12 DIAGNOSIS — R55 Syncope and collapse: Secondary | ICD-10-CM | POA: Diagnosis not present

## 2022-04-12 DIAGNOSIS — I959 Hypotension, unspecified: Secondary | ICD-10-CM | POA: Diagnosis not present

## 2022-04-12 LAB — BASIC METABOLIC PANEL
Anion gap: 9 (ref 5–15)
BUN: 25 mg/dL — ABNORMAL HIGH (ref 8–23)
CO2: 28 mmol/L (ref 22–32)
Calcium: 8.4 mg/dL — ABNORMAL LOW (ref 8.9–10.3)
Chloride: 102 mmol/L (ref 98–111)
Creatinine, Ser: 1.25 mg/dL — ABNORMAL HIGH (ref 0.44–1.00)
GFR, Estimated: 44 mL/min — ABNORMAL LOW (ref >60)
Glucose, Bld: 122 mg/dL — ABNORMAL HIGH (ref 70–99)
Potassium: 3.3 mmol/L — ABNORMAL LOW (ref 3.5–5.1)
Sodium: 139 mmol/L (ref 135–145)

## 2022-04-12 LAB — CBC
HCT: 34.6 % — ABNORMAL LOW (ref 36.0–46.0)
Hemoglobin: 11.8 g/dL — ABNORMAL LOW (ref 12.0–15.0)
MCH: 32 pg (ref 26.0–34.0)
MCHC: 34.1 g/dL (ref 30.0–36.0)
MCV: 93.8 fL (ref 80.0–100.0)
Platelets: 154 10*3/uL (ref 150–400)
RBC: 3.69 MIL/uL — ABNORMAL LOW (ref 3.87–5.11)
RDW: 13 % (ref 11.5–15.5)
WBC: 5.2 10*3/uL (ref 4.0–10.5)
nRBC: 0 % (ref 0.0–0.2)

## 2022-04-12 LAB — MAGNESIUM: Magnesium: 2.2 mg/dL (ref 1.7–2.4)

## 2022-04-12 MED ORDER — MIDODRINE HCL 5 MG PO TABS
5.0000 mg | ORAL_TABLET | Freq: Three times a day (TID) | ORAL | Status: DC
  Administered 2022-04-12: 5 mg via ORAL
  Filled 2022-04-12: qty 1

## 2022-04-12 NOTE — Evaluation (Signed)
Physical Therapy Evaluation Patient Details Name: Kimberly Cook MRN: [id number] DOB: 1946-01-13 Today's Date: 04/12/2022  History of Present Illness  As per MR: Kimberly Cook is a 76 y.o. Caucasian female with medical history significant for   anxiety, osteoarthritis, Back lumbar surgeryin June 2022 ( fusion), chronic HFrEF, stage 3A CKD, CAD, DDD, depression, essential hypertension, GERD, hypothyroidism and dyslipidemia, who presented to the ER with acute onset of syncope with subsequent fall.  The patient was found on the ground and was seen not to trip before her fall.  She has not been feeling well lately and has been somnolent.  A UA was positive for UTI yesterday.  She subsequently sustained a right periorbital hematoma and a laceration that was repaired in the ER.  No reported chest pain or palpitations.  No reported paresthesias or focal muscle weakness.  No tongue bites or urinary or stool incontinence.  No witnessed seizures.  No reported nausea or vomiting or diarrhea or abdominal pain.     She was just treated with 5 days of p.o. Bactrim before yesterday and underwent flexible cystoscopy by Dr. Apolinar Junes yesterday revealing bladder wall thickening and bladder erythema with differential diagnosis including CIS versus chronic cystitis from bacterial colonization.  Recommendation was made for consideration of cystoscopy with bladder biopsy in the OR and the plan for preoperative urine culture.  The patient's urine culture from 8/21 revealed E. coli with susceptibility consistent with probable ESBL with resistance to cephalosporins, Levaquin and ampicillin but sensitive to Bactrim, imipenem, ertapenem, Zosyn as well as Unasyn and gentamicin.  Clinical Impression  Pt received in bed agreeable to participate in therapy evaluation. Pt is [facility] nursing home resident and is OK with returning there.  Today's assessment revealed pt has fluctuating BP.  Pt BP checked for orthostatic: Supine: 98/70 HR  96, Sitting 87/47 HR 147 symptomatic, stand 109/91 HR 97 symptomatic. Pt has LLE weakness due to chronic back problems and now lumbar fusion June 2022. Pt needs CGA for all functional mobility with RW. Pt remains slow and guarded and fearful of falling. Sensation impaired in LLE.  [facility] care. .      Recommendations for follow up therapy are one component of a multi-disciplinary discharge planning process, led by the attending physician.  Recommendations may be updated based on patient status, additional functional criteria and insurance authorization.  Follow Up Recommendations Skilled nursing-short term rehab (<3 hours/day) Can patient physically be transported by private vehicle: No    Assistance Recommended at Discharge Intermittent Supervision/Assistance  Patient can return home with the following  A little help with walking and/or transfers;A little help with bathing/dressing/bathroom;Assistance with cooking/housework;Assistance with feeding;Direct supervision/assist for medications management;Assist for transportation;Direct supervision/assist for financial management    Equipment Recommendations None recommended by PT  Recommendations for Other Services       Functional Status Assessment Patient has had a recent decline in their functional status and demonstrates the ability to make significant improvements in function in a reasonable and predictable amount of time.     Precautions / Restrictions Precautions Precautions: Fall Restrictions Weight Bearing Restrictions: No      Mobility  Bed Mobility Overal bed mobility: Needs Assistance Bed Mobility: Supine to Sit     Supine to sit: Min guard     General bed mobility comments: slow    Transfers Overall transfer level: Needs assistance Equipment used: Rolling walker (2 wheels) Transfers: Sit to/from  Stand, Bed to chair/wheelchair/BSC Sit to Stand: Min guard    Step pivot transfers: Min guard (VC for sequencing)            Ambulation/Gait Ambulation/Gait assistance: Min guard Gait Distance (Feet): 24 Feet Assistive device: Rolling walker (2 wheels) Gait Pattern/deviations: Step-through pattern, Shuffle, Trunk flexed, Antalgic Gait velocity: dec     General Gait Details: LLE weakness  Stairs:N/A            Wheelchair Mobility    Modified Rankin (Stroke Patients Only)       Balance Overall balance assessment: Needs assistance Sitting-balance support: Feet supported Sitting balance-Leahy Scale: Good     Standing balance support: Bilateral upper extremity supported Standing balance-Leahy Scale: Fair Standing balance comment:  (pt is dizzy with change of position.)                             Pertinent Vitals/Pain Pain Assessment Pain Assessment:  (has a headache)    Home Living Family/patient expects to be discharged to:: Skilled nursing facility                   Additional Comments: has been at [facility] for around a year. therapy on and off since spine surgery.    Prior Function Prior Level of Function : Needs assist   Mobility (Cognitive): Intermittent cues         Mobility Comments: patient is independent with mobility, ambulatory around the facility with a rollator (which has become difficult over past few days.) ADLs Comments: needs assistance for wiping after toileting, lower body dressing, and putting on a bra.     Hand Dominance   Dominant Hand: Right    Extremity/Trunk Assessment   Upper Extremity Assessment Upper Extremity Assessment: Overall WFL for tasks assessed    Lower Extremity Assessment Lower Extremity Assessment: Generalized weakness;LLE deficits/detail LLE: Unable to fully assess due to pain LLE Sensation: decreased light touch (numbness and burning) LLE Coordination: WNL       Communication   Communication: No difficulties  Cognition  Arousal/Alertness: Awake/alert Behavior During Therapy: WFL for tasks assessed/performed Overall Cognitive Status: Within Functional Limits for tasks assessed                                          General Comments General comments (skin integrity, edema, etc.): Hematoma and laceration in R forehead    Exercises     Assessment/Plan    PT Assessment Patient needs continued PT services  PT Problem List Decreased strength;Decreased range of motion;Decreased activity tolerance;Decreased balance;Decreased mobility;Impaired sensation;Pain       PT Treatment Interventions Gait training;Functional mobility training;Therapeutic activities;Therapeutic exercise;Balance training;Neuromuscular re-education;Patient/family education    PT Goals (Current goals can be found in the Care Plan section)  Acute Rehab PT Goals Patient Stated Goal: " I can to go back to [facility] and get better there again." PT Goal Formulation: With patient Time For Goal Achievement: 04/26/22 Potential to Achieve Goals: Good    Frequency Min 2X/week     Co-evaluation               AM-PAC PT "6 Clicks" Mobility  Outcome Measure Help needed turning from your back to your side while in a flat bed without using bedrails?: A Little Help needed moving from lying on your back  to sitting on the side of a flat bed without using bedrails?: A Little Help needed moving to and from a bed to a chair (including a wheelchair)?: A Little Help needed standing up from a chair using your arms (e.g., wheelchair or bedside chair)?: A Little Help needed to walk in hospital room?: A Little Help needed climbing 3-5 steps with a railing? : Total 6 Click Score: 16    End of Session Equipment Utilized During Treatment: Gait belt Activity Tolerance: Patient limited by fatigue;Patient limited by pain;Patient tolerated treatment well Patient left: in chair;with call bell/phone within reach;with chair alarm  set Nurse Communication: Mobility status PT Visit Diagnosis: Unsteadiness on feet (R26.81);Muscle weakness (generalized) (M62.81);History of falling (Z91.81);Dizziness and giddiness (R42);Pain Pain - Right/Left: Left Pain - part of body: Knee;Leg    Time: 2992-4268 PT Time Calculation (min) (ACUTE ONLY): 39 min   Charges:   PT Evaluation $PT Eval Moderate Complexity: 1 Mod PT Treatments $Gait Training: 8-22 mins $Therapeutic Activity: 8-22 mins      Sun Wilensky PT DPT 2:51 PM,04/12/22   Shatina Streets H Suan Pyeatt 04/12/2022, 2:40 PM

## 2022-04-12 NOTE — Hospital Course (Signed)
76 y.o. female with medical history significant for   anxiety, osteoarthritis, chronic HFrEF, stage 3A CKD, CAD, DDD s/p spinal surgery with hardware in place and ongoing lumbar pain with radiculopathy, depression, essential hypertension, GERD, hypothyroidism and dyslipidemia, who presented to the ER on 04/11/2022 after a suspected episode of syncope and a fall in which she sustained a right supraorbital laceration and hematoma.  Laceration was repaired in the ED.  She was reportedly found on the ground, fall was unwitnessed.  Pt reported not been feeling well lately and has been somnolent.  A UA was consistent with UTI the day before presenting, when she was seen by urology and had in office cystoscopy performed.  Urology plans to do cystoscopy in the OR for bladder biopsy in the near future for bladder wall thickening.    ED course -- BP 99/70 with otherwise stable vitals.  Labs mostly unremarkable, K 3.3, Cr 1.13 near baseline. CBC was normal.  Negative Covid-19 PCR.  Troponin was 3.  EKG with NSR 73 bpm, T wave inversions anteroseptal and laterally.  CXR was non-acute.  CT's of face and C-spine with no fractures or traumatic injuries seen other than right front scalp hematoma.  Admitted for observation and further evaluation.  9/8: patient with significant orthostatic hypotension and symptomatic.  Started on midodrine.  Discussed antibiotics with urology, recommended only treating if symptomatic.  Stopped antibiotics and monitoring clinically for now.  9/9-11: still hypotensive despite max dose midodrine, adjusting CHF medications

## 2022-04-12 NOTE — Progress Notes (Signed)
Progress Note   Patient: Kimberly Cook RAQ:[id number] DOB: August 01, 1946 DOA: 04/11/2022     0 DOS: the patient was seen and examined on 04/12/2022   Brief hospital course: 76 y.o. female with medical history significant for   anxiety, osteoarthritis, chronic HFrEF, stage 3A CKD, CAD, DDD s/p spinal surgery with hardware in place and ongoing lumbar pain with radiculopathy, depression, essential hypertension, GERD, hypothyroidism and dyslipidemia, who presented to the ER on 04/11/2022 after a suspected episode of syncope and a fall in which she sustained a right supraorbital laceration and hematoma.  Laceration was repaired in the ED.  She was reportedly found on the ground, fall was unwitnessed.  Pt reported not been feeling well lately and has been somnolent.  A UA was consistent with UTI the day before presenting, when she was seen by urology and had in office cystoscopy performed.  Urology plans to do cystoscopy in the OR for bladder biopsy in the near future for bladder wall thickening.    ED course -- BP 99/70 with otherwise stable vitals.  Labs mostly unremarkable, K 3.3, Cr 1.13 near baseline. CBC was normal.  Negative Covid-19 PCR.  Troponin was 3.  EKG with NSR 73 bpm, T wave inversions anteroseptal and laterally.  CXR was non-acute.  CT's of face and C-spine with no fractures or traumatic injuries seen other than right front scalp hematoma.  Admitted for observation and further evaluation.  9/8: patient with significant orthostatic hypotension and symptomatic.  Started on midodrine.  Discussed antibiotics with urology, recommended only treating if symptomatic.  Stopped antibiotics and monitoring clinically for now.  Assessment and Plan: * Syncope Suspect due to orthostatic hypotension.   9/8: positive orthostatic vitals and BP's overall soft at rest --follow up Echo, Carotid U/S --orthostatic vitals at least daily --started on midodrine --telemetry monitoring --stop IV fluids   Most  recent 2D echo revealed an EF of 45 to 50% with indeterminate diastolic function.      Recurrent UTI Pt followed by urology closely, just seen day before for in office cysto. Differential diagnosis for her abnormal UA includes CIS and chronic cystitis from colonization.   Started on Unasyn on admission  given her syncope and generalized weakness, as well as altered mental status with somnolence. --Stop antibiotics --Suspect presenting symptoms are due to hypotension and orthostatic in nature. --Monitor fever curve, CBC and for symptoms of UTI --If we need to treat, would use meropenem given ESBL E. Coli on her very recent urine culture.   Hypokalemia K being replaced. Monitor BMP, replace K for goal > 4.0  Peripheral neuropathy --continue Lyrica  HFrEF (heart failure with reduced ejection fraction) (HCC) -- Continue Coreg, Entresto Farxiga, torsemide --Monitor volume status closely  CKD Stage IIIa Given gentle IV hydration on admission. --Stop IV fluids --Monitor BMP  Anxiety and depression --continue Xanax and Lexapro  Essential hypertension Presented hypotensive and having symptomatic orthostatic hypotension. BP-lowering meds are indicated for cardiac issues. --Continue low dose Coreg, Entresto, torsemide --Started on midodrine 5 mg TID WC  GERD (gastroesophageal reflux disease) --continue PPI   Hyperlipidemia --continue statin         Subjective: Pt sitting up in recliner when seen today.  She reports being very fearful of getting up since she fell and struck her head.  She reports ongoing severe back pain.  Left leg feels very heavy and legs continue to hurt.  Reports she can feel the hardware in her back and has difficulty getting comfortable.  Physical Exam: Vitals:   04/12/22 0500 04/12/22 0513 04/12/22 0751 04/12/22 1626  BP:  90/65 (!) 92/57 (!) 88/60  Pulse:  85 78 81  Resp:  16 17 17   Temp:  97.8 F (36.6 C) 97.6 F (36.4 C) 97.7 F (36.5  C)  TempSrc:  Oral    SpO2:  96% 94% 98%  Weight: 83.7 kg     Height:       General exam: awake, alert, no acute distress HEENT: Right forehead laceration sutured and not bleeding, superior to laceration is a dark hematoma, moist mucus membranes, hearing grossly normal  Respiratory system: CTAB, no wheezes, rales or rhonchi, normal respiratory effort. Cardiovascular system: normal S1/S2,  RRR, no JVD, murmurs, rubs, gallops, no pedal edema.   Gastrointestinal system: soft, NT, ND, no suprapubic tenderness Central nervous system: A&O x4. no gross focal neurologic deficits, normal speech Extremities: anterior shins are tender on palpation, no edema, normal tone Skin: dry, intact, normal temperature Psychiatry: normal mood, congruent affect, judgement and insight appear normal   Data Reviewed:  Notable labs -- K unchanged at 3.3 glucose 122, BUN 25, Cr 1.25 from 1.13, Ca 8.4 Hbg 11.8 from 12.9,   Family Communication: none at bedside, will attempt to call   Disposition: Status is: Observation The patient remains OBS appropriate and will d/c before 2 midnights.  Remains in the hospital monitoring of IV antibiotics to see if true symptomatic UTI and due to severe orthostatic symptoms with hypotension.   Planned Discharge Destination:  SNF vs LTC at [facility]    Time spent: 45 minutes  Author: [facility], DO 04/12/2022 5:55 PM  For on call review [email or web address].

## 2022-04-12 NOTE — Plan of Care (Signed)

## 2022-04-13 ENCOUNTER — Observation Stay: Payer: Medicare HMO

## 2022-04-13 DIAGNOSIS — S0003XA Contusion of scalp, initial encounter: Secondary | ICD-10-CM | POA: Diagnosis not present

## 2022-04-13 DIAGNOSIS — R55 Syncope and collapse: Secondary | ICD-10-CM | POA: Diagnosis not present

## 2022-04-13 DIAGNOSIS — N390 Urinary tract infection, site not specified: Secondary | ICD-10-CM | POA: Diagnosis not present

## 2022-04-13 DIAGNOSIS — R519 Headache, unspecified: Secondary | ICD-10-CM | POA: Diagnosis not present

## 2022-04-13 DIAGNOSIS — I959 Hypotension, unspecified: Secondary | ICD-10-CM | POA: Diagnosis not present

## 2022-04-13 DIAGNOSIS — E876 Hypokalemia: Secondary | ICD-10-CM | POA: Diagnosis not present

## 2022-04-13 DIAGNOSIS — I5020 Unspecified systolic (congestive) heart failure: Secondary | ICD-10-CM | POA: Diagnosis not present

## 2022-04-13 DIAGNOSIS — G44309 Post-traumatic headache, unspecified, not intractable: Secondary | ICD-10-CM | POA: Diagnosis present

## 2022-04-13 LAB — BASIC METABOLIC PANEL
Anion gap: 9 (ref 5–15)
BUN: 22 mg/dL (ref 8–23)
CO2: 26 mmol/L (ref 22–32)
Calcium: 8.7 mg/dL — ABNORMAL LOW (ref 8.9–10.3)
Chloride: 101 mmol/L (ref 98–111)
Creatinine, Ser: 1.22 mg/dL — ABNORMAL HIGH (ref 0.44–1.00)
GFR, Estimated: 46 mL/min — ABNORMAL LOW (ref >60)
Glucose, Bld: 90 mg/dL (ref 70–99)
Potassium: 3.7 mmol/L (ref 3.5–5.1)
Sodium: 136 mmol/L (ref 135–145)

## 2022-04-13 LAB — CBC
HCT: 33.7 % — ABNORMAL LOW (ref 36.0–46.0)
Hemoglobin: 11.4 g/dL — ABNORMAL LOW (ref 12.0–15.0)
MCH: 31.3 pg (ref 26.0–34.0)
MCHC: 33.8 g/dL (ref 30.0–36.0)
MCV: 92.6 fL (ref 80.0–100.0)
Platelets: 145 10*3/uL — ABNORMAL LOW (ref 150–400)
RBC: 3.64 MIL/uL — ABNORMAL LOW (ref 3.87–5.11)
RDW: 12.9 % (ref 11.5–15.5)
WBC: 3.8 10*3/uL — ABNORMAL LOW (ref 4.0–10.5)
nRBC: 0 % (ref 0.0–0.2)

## 2022-04-13 LAB — GLUCOSE, CAPILLARY: Glucose-Capillary: 92 mg/dL (ref 70–99)

## 2022-04-13 MED ORDER — TORSEMIDE 20 MG PO TABS
20.0000 mg | ORAL_TABLET | Freq: Every day | ORAL | Status: DC
  Administered 2022-04-13 – 2022-04-14 (×2): 20 mg via ORAL
  Filled 2022-04-13 (×2): qty 1

## 2022-04-13 MED ORDER — ORAL CARE MOUTH RINSE: 15.0000 mL | OROMUCOSAL | Status: DC | PRN

## 2022-04-13 MED ORDER — MIDODRINE HCL 5 MG PO TABS
10.0000 mg | ORAL_TABLET | Freq: Three times a day (TID) | ORAL | Status: DC
  Administered 2022-04-13 – 2022-04-17 (×14): 10 mg via ORAL
  Filled 2022-04-13 (×14): qty 2

## 2022-04-13 MED ORDER — BUTALBITAL-APAP-CAFFEINE 50-325-40 MG PO TABS
1.0000 | ORAL_TABLET | Freq: Four times a day (QID) | ORAL | Status: DC | PRN
  Administered 2022-04-13 – 2022-04-16 (×10): 1 via ORAL
  Filled 2022-04-13 (×10): qty 1

## 2022-04-13 MED ORDER — SODIUM CHLORIDE 0.9 % IV SOLN
1.0000 g | Freq: Two times a day (BID) | INTRAVENOUS | Status: DC
  Administered 2022-04-13 – 2022-04-14 (×3): 1 g via INTRAVENOUS
  Filled 2022-04-13: qty 20
  Filled 2022-04-13: qty 1
  Filled 2022-04-13 (×2): qty 20

## 2022-04-13 NOTE — Assessment & Plan Note (Addendum)
BP's remain low despite midodrine 5 mg TID started yesterday.  Had single dose so far, but BP overnight was 89/45, this AM 101/63 at 0430. --Continue midodrine to 10 mg TID WC --Resume antibiotics for UTI --Hope to avoid stopping Coreg or Entresto given her CHF

## 2022-04-13 NOTE — TOC Initial Note (Signed)
Transition of Care [facility]) - Initial/Assessment Note    Patient Details  Name: Kimberly Cook MRN: [id number] Date of Birth: [birth year]  Transition of Care [facility]) CM/SW Contact:    Kemper Durie, RN Phone Number: 04/13/2022, 12:33 PM  Clinical Narrative:                  Patient admitted to hospital on 9/7 after fall at SNF.  She is LTC resident at [facility] [place] evaluation on 9/8 recommend short term rehab.  Spoke with patient, she is aware and is ok to return to facility.  Spoke with Gavin Pound at [facility], aware of patients admission and recommendations for rehab.  FL2 completed.  Expected Discharge Plan: Skilled Nursing Facility Barriers to Discharge: Continued Medical Work up   Patient Goals and CMS Choice Patient states their goals for this hospitalization and ongoing recovery are:: SNF for rehab CMS Medicare.gov Compare Post Acute Care list provided to:: Patient Choice offered to / list presented to : Patient  Expected Discharge Plan and Services Expected Discharge Plan: Skilled Nursing Facility     Post Acute Care Choice: Skilled Nursing Facility Living arrangements for the past 2 months: Skilled Nursing Facility                                      Prior Living Arrangements/Services Living arrangements for the past 2 months: Skilled Nursing Facility Lives with:: Facility Resident (admitted from [place], currently residing for rehab) Patient language and need for interpreter reviewed:: Yes Do you feel safe going back to the place where you live?: Yes      Need for Family Participation in Patient Care: Yes (Comment) Care giver support system in place?: Yes (comment) Current home services: DME Criminal Activity/Legal Involvement Pertinent to Current Situation/Hospitalization: Yes - Comment as needed  Activities of Daily Living Home Assistive Devices/Equipment: Scales, Sling (specify type), Walker (specify type) ADL Screening (condition at time of  admission) Patient's cognitive ability adequate to safely complete daily activities?: Yes Is the patient deaf or have difficulty hearing?: No Does the patient have difficulty seeing, even when wearing glasses/contacts?: No Does the patient have difficulty concentrating, remembering, or making decisions?: No Patient able to express need for assistance with ADLs?: Yes Does the patient have difficulty dressing or bathing?: Yes Independently performs ADLs?: Yes (appropriate for developmental age) Does the patient have difficulty walking or climbing stairs?: Yes Weakness of Legs: Both Weakness of Arms/Hands: Both  Permission Sought/Granted Permission sought to share information with : Case Manager Permission granted to share information with : Yes, Release of Information Signed     Permission granted to share info w AGENCY: [facility]        Emotional Assessment   Attitude/Demeanor/Rapport: Engaged Affect (typically observed): Calm Orientation: : Oriented to Self, Oriented to Place, Oriented to  Time, Oriented to Situation   Psych Involvement: No (comment)  Admission diagnosis:  Syncope and collapse [R55] Syncope [R55] Fall, initial encounter [W19.XXXA] Laceration of scalp without foreign body, initial encounter [S01.01XA] Patient Active Problem List   Diagnosis Date Noted   Hypotension 04/13/2022   HFrEF (heart failure with reduced ejection fraction) (HCC) 04/11/2022   Peripheral neuropathy 04/11/2022   Hypokalemia 04/11/2022   Fall    Acute renal failure superimposed on stage 3a chronic kidney disease (HCC) 03/04/2022   UTI (urinary tract infection) 03/03/2022   Toxic metabolic encephalopathy 03/02/2022  Obesity (BMI 30-39.9) 03/02/2022   Exercise intolerance 03/02/2022   Left leg numbness 03/02/2022   Pericardial effusion 01/04/2022   History of stroke 08/29/2021   Neuropathy 08/29/2021   NICM (nonischemic cardiomyopathy) (HCC)    Weakness    Bilateral pleural  effusion    Chest pain    Pancytopenia (HCC)    Chronic HFrEF (heart failure with reduced ejection fraction) (HCC)    Malnutrition of moderate degree 04/03/2021   Metabolic encephalopathy 03/31/2021   Failed spinal cord stimulator, subsequent encounter 08/08/2020   S/P insertion of spinal cord stimulator 04/25/2020   Lumbar radiculopathy 12/07/2019   Lumbar facet arthropathy 12/07/2019   Spinal stenosis, lumbar region, with neurogenic claudication 12/07/2019   Osteoporosis 11/24/2019   Lumbar herniated disc 06/09/2019   Lumbar degenerative disc disease 06/09/2019   Spinal stenosis of cervical region 05/07/2019   Lumbar spondylosis 05/07/2019   Recurrent UTI 05/07/2019   Abnormal MRI, lumbar spine 02/18/2019   Abnormal gait 02/18/2019   Chronic back pain 02/18/2019   Osteoarthritis 10/09/2018   Overactive bladder 10/09/2018   Chronic midline low back pain 04/22/2018   Chronic pain of left knee 04/22/2018   Toe fracture, left 03/10/2018   Lightheadedness 01/21/2018   PAC (premature atrial contraction) 01/19/2018   PVC's (premature ventricular contractions) 01/19/2018   Hernia, paraesophageal 12/16/2017   Kidney cysts 12/16/2017   CKD Stage IIIa 12/16/2017   Carotid artery stenosis 12/16/2017   Chronic pain 12/16/2017   Osteopenia 12/16/2017   DDD (degenerative disc disease), cervical 12/16/2017   Labile hypertension 09/05/2017   Anxiety and depression 08/27/2017   Insomnia 08/27/2017   Syncope 08/27/2017   Bilateral occipital neuralgia 08/27/2017   Lung nodule 06/30/2017   Coronary artery disease involving native coronary artery of native heart without angina pectoris 06/03/2017   SOB (shortness of breath) 04/11/2017   Labile blood pressure 03/08/2017   Syncope, near 03/08/2017   Palpitations 03/08/2017   Orthostatic lightheadedness 03/08/2017   Essential hypertension    S/P TKR (total knee replacement) 04/17/2015   Morbid obesity (HCC) 09/21/2014   Medication  management 09/21/2014   Hypothyroidism 02/17/2014   Hyperlipidemia    GERD (gastroesophageal reflux disease)    Vitamin D deficiency    Depression    PCP:  System, Provider Not In Pharmacy:   [facility] - [place], [place] - [address] [place] [address] Phone: [phone] Fax: [phone]  [facility] - [place], [place] - [address] [place] [address] Phone: [phone] Fax: [phone]     Social Determinants of Health (SDOH) Interventions    Readmission Risk Interventions     No data to display

## 2022-04-13 NOTE — NC FL2 (Signed)
[place] LEVEL OF CARE SCREENING TOOL     IDENTIFICATION  Patient Name: Kimberly Cook Birthdate: Jan 20, 1946 Sex: female Admission Date (Current Location): 04/11/2022  [facility] and [place] Number:  Engineering geologist and Address:  [facility], [address] [place], [place] [address]      Provider Number: [id number]  Attending Physician Name and Address:  Ezekiel Slocumb, DO  Relative Name and Phone Number:  Etheleen Nicks [phone]    Current Level of Care: Hospital Recommended Level of Care: [place] Prior Approval Number:    Date Approved/Denied:   PASRR Number: [id number]  Discharge Plan: SNF    Current Diagnoses: Patient Active Problem List   Diagnosis Date Noted   Hypotension 04/13/2022   HFrEF (heart failure with reduced ejection fraction) ([place]) 04/11/2022   Peripheral neuropathy 04/11/2022   Hypokalemia 04/11/2022   Fall    Acute renal failure superimposed on stage 3a chronic kidney disease ([place]) 03/04/2022   UTI (urinary tract infection) [id number]   Toxic metabolic encephalopathy [id number]   Obesity (BMI 30-39.9) 03/02/2022   Exercise intolerance 03/02/2022   Left leg numbness 03/02/2022   Pericardial effusion 01/04/2022   History of stroke 08/29/2021   Neuropathy 08/29/2021   NICM (nonischemic cardiomyopathy) (HCC)    Weakness    Bilateral pleural effusion    Chest pain    Pancytopenia (HCC)    Chronic HFrEF (heart failure with reduced ejection fraction) ([place])    Malnutrition of moderate degree [id number]   Metabolic encephalopathy [id number]   Failed spinal cord stimulator, subsequent encounter 08/08/2020   S/P insertion of spinal cord stimulator 04/25/2020   Lumbar radiculopathy 12/07/2019   Lumbar facet arthropathy 12/07/2019   Spinal stenosis, lumbar region, with neurogenic claudication 12/07/2019   Osteoporosis 11/24/2019   Lumbar herniated disc 06/09/2019   Lumbar  degenerative disc disease 06/09/2019   Spinal stenosis of cervical region 05/07/2019   Lumbar spondylosis 05/07/2019   Recurrent UTI 05/07/2019   Abnormal MRI, lumbar spine 02/18/2019   Abnormal gait 02/18/2019   Chronic back pain 02/18/2019   Osteoarthritis 10/09/2018   Overactive bladder 10/09/2018   Chronic midline low back pain 04/22/2018   Chronic pain of left knee 04/22/2018   Toe fracture, left 03/10/2018   Lightheadedness 01/21/2018   PAC (premature atrial contraction) 01/19/2018   PVC's (premature ventricular contractions) 01/19/2018   Hernia, paraesophageal 12/16/2017   Kidney cysts 12/16/2017   CKD Stage IIIa 12/16/2017   Carotid artery stenosis 12/16/2017   Chronic pain 12/16/2017   Osteopenia 12/16/2017   DDD (degenerative disc disease), cervical 12/16/2017   Labile hypertension 09/05/2017   Anxiety and depression 08/27/2017   Insomnia 08/27/2017   Syncope 08/27/2017   Bilateral occipital neuralgia 08/27/2017   Lung nodule 06/30/2017   Coronary artery disease involving native coronary artery of native heart without angina pectoris 06/03/2017   SOB (shortness of breath) 04/11/2017   Labile blood pressure 03/08/2017   Syncope, near 03/08/2017   Palpitations 03/08/2017   Orthostatic lightheadedness 03/08/2017   Essential hypertension    S/P TKR (total knee replacement) 04/17/2015   Morbid obesity ([place]) 09/21/2014   Medication management 09/21/2014   Hypothyroidism 02/17/2014   Hyperlipidemia    GERD (gastroesophageal reflux disease)    Vitamin D deficiency    Depression     Orientation RESPIRATION BLADDER Height & Weight     Self, Time, Situation, Place  Normal Incontinent Weight: 83.3 kg Height:  5\' 4"  (162.6 cm)  BEHAVIORAL  SYMPTOMS/MOOD NEUROLOGICAL BOWEL NUTRITION STATUS      Incontinent Diet  AMBULATORY STATUS COMMUNICATION OF NEEDS Skin   Limited Assist Verbally Bruising                       Personal Care Assistance Level of Assistance   Bathing, Feeding Bathing Assistance: Limited assistance Feeding assistance: Limited assistance       Functional Limitations Info             SPECIAL CARE FACTORS FREQUENCY  PT (By licensed PT), OT (By licensed OT)     PT Frequency: 5 times a week OT Frequency: 5 times a week            Contractures Contractures Info: Not present    Additional Factors Info  Code Status, Allergies Code Status Info: DNR Allergies Info: NKA           Current Medications (04/13/2022):  This is the current hospital active medication list Current Facility-Administered Medications  Medication Dose Route Frequency Provider Last Rate Last Admin   acetaminophen (TYLENOL) tablet 650 mg  650 mg Oral Q6H PRN Mansy, Jan A, MD   650 mg at 04/12/22 2117   Or   acetaminophen (TYLENOL) suppository 650 mg  650 mg Rectal Q6H PRN Mansy, Jan A, MD       ALPRAZolam Prudy Feeler) tablet 0.5 mg  0.5 mg Oral BID PRN Mansy, Jan A, MD       ascorbic acid (VITAMIN C) tablet 500 mg  500 mg Oral Daily Mansy, Jan A, MD   500 mg at 04/13/22 0913   atorvastatin (LIPITOR) tablet 20 mg  20 mg Oral Daily Mansy, Jan A, MD   20 mg at 04/13/22 0913   butalbital-acetaminophen-caffeine (FIORICET) 50-325-40 MG per tablet 1 tablet  1 tablet Oral Q6H PRN Esaw Grandchild A, DO   1 tablet at 04/13/22 1054   carvedilol (COREG) tablet 3.125 mg  3.125 mg Oral BID WC Mansy, Jan A, MD   3.125 mg at 04/13/22 0913   cholecalciferol (VITAMIN D3) 25 MCG (1000 UNIT) tablet 5,000 Units  5,000 Units Oral Daily Mansy, Jan A, MD   5,000 Units at 04/13/22 0914   citalopram (CELEXA) tablet 20 mg  20 mg Oral Daily Mansy, Jan A, MD   20 mg at 04/13/22 0913   cyanocobalamin (VITAMIN B12) tablet 1,000 mcg  1,000 mcg Oral Daily Mansy, Jan A, MD   1,000 mcg at 04/13/22 0913   dapagliflozin propanediol (FARXIGA) tablet 10 mg  10 mg Oral QAC breakfast Mansy, Jan A, MD   10 mg at 04/13/22 0915   folic acid (FOLVITE) tablet 0.5 mg  500 mcg Oral Daily Mansy, Jan A,  MD   0.5 mg at 04/13/22 0913   levothyroxine (SYNTHROID) tablet 100 mcg  100 mcg Oral QAC breakfast Mansy, Jan A, MD   100 mcg at 04/13/22 0540   lidocaine (LIDODERM) 5 % 1 patch  1 patch Transdermal Q24H Mansy, Jan A, MD   1 patch at 04/13/22 0913   loratadine (CLARITIN) tablet 10 mg  10 mg Oral Daily Mansy, Jan A, MD   10 mg at 04/13/22 0913   magnesium hydroxide (MILK OF MAGNESIA) suspension 30 mL  30 mL Oral Daily PRN Mansy, Jan A, MD       magnesium oxide (MAG-OX) tablet 400 mg  400 mg Oral Daily Mansy, Jan A, MD   400 mg at 04/13/22 0914   melatonin tablet 5  mg  5 mg Oral QHS Mansy, Jan A, MD   5 mg at 04/12/22 2116   midodrine (PROAMATINE) tablet 10 mg  10 mg Oral TID WC Esaw Grandchild A, DO   10 mg at 04/13/22 1122   montelukast (SINGULAIR) tablet 10 mg  10 mg Oral QHS Mansy, Jan A, MD   10 mg at 04/12/22 2116   multivitamin with minerals tablet 1 tablet  1 tablet Oral Daily Mansy, Jan A, MD   1 tablet at 04/13/22 0914   ondansetron (ZOFRAN) tablet 4 mg  4 mg Oral Q6H PRN Mansy, Jan A, MD       Or   ondansetron [facility]) injection 4 mg  4 mg Intravenous Q6H PRN Mansy, Jan A, MD       pantoprazole (PROTONIX) EC tablet 40 mg  40 mg Oral Daily Mansy, Jan A, MD   40 mg at 04/13/22 0913   polyethylene glycol powder (GLYCOLAX/MIRALAX) container 17 g  17 g Oral Daily PRN Mansy, Jan A, MD       potassium chloride SA (KLOR-CON M) CR tablet 20 mEq  20 mEq Oral BID Mansy, Jan A, MD   20 mEq at 04/13/22 0914   pregabalin (LYRICA) capsule 75 mg  75 mg Oral TID Mansy, Jan A, MD   75 mg at 04/13/22 0913   sacubitril-valsartan (ENTRESTO) 24-26 mg per tablet  1 tablet Oral BID Mansy, Jan A, MD   1 tablet at 04/13/22 0914   sodium chloride flush (NS) 0.9 % injection 3 mL  3 mL Intravenous Q12H Mansy, Jan A, MD   3 mL at 04/13/22 0915   Tdap (BOOSTRIX) injection 0.5 mL  0.5 mL Intramuscular Once Concha Se, MD       torsemide [facility]) tablet 20 mg  20 mg Oral Daily Esaw Grandchild A, DO   20 mg at  04/13/22 0914   traMADol (ULTRAM) tablet 50 mg  50 mg Oral Q6H PRN Mansy, Jan A, MD   50 mg at 04/13/22 0913   traZODone (DESYREL) tablet 100 mg  100 mg Oral QHS Mansy, Jan A, MD   100 mg at 04/12/22 2117   traZODone (DESYREL) tablet 25 mg  25 mg Oral QHS PRN Mansy, Vernetta Honey, MD         Discharge Medications: Please see discharge summary for a list of discharge medications.  Relevant Imaging Results:  Relevant Lab Results:   Additional Information SSN:[id number]  Kemper Durie, RN

## 2022-04-13 NOTE — Assessment & Plan Note (Signed)
Likely due to her fall, laceration and forehead hematoma.  Suspect she has concussion. --Repeat head CT on 9/9 was negative --Little relief with Fioricet or Tylenol --Discussed with neurology - suggested trial of 10 mg IV Reglan x 1, and continue analgesia PRN for 1-2 weeks.  --Reglan ordered --Norco PRN (stopped her PRN tramadol for now)

## 2022-04-13 NOTE — Consult Note (Signed)
Pharmacy Antibiotic Note  DHANYA BOGLE is a 76 y.o. female admitted on 04/11/2022 with UTI.  Pharmacy has been consulted for meropenem dosing.  Plan: Give Meropenem 1gm IV every 12 hours Continue to monitor and dose adjust antibiotics according to renal function and indication   Height: 5\' 4"  (162.6 cm) Weight: 83.3 kg (183 lb 10.3 oz) IBW/kg (Calculated) : 54.7  Temp (24hrs), Avg:98 F (36.7 C), Min:97.7 F (36.5 C), Max:98.6 F (37 C)  Recent Labs  Lab 04/11/22 1851 04/12/22 0522 04/13/22 0606  WBC 5.1 5.2 3.8*  CREATININE 1.13* 1.25* 1.22*    Estimated Creatinine Clearance: 40.3 mL/min (A) (by C-G formula based on SCr of 1.22 mg/dL (H)).    No Known Allergies  Antimicrobials this admission: 9/7 Unasyn >> 9/9 9/9 Meropenem >>     Microbiology results: 9/7 UCx: 100,000 CFU E. Coli (susceptibilities pending)    Thank you for allowing pharmacy to be a part of this patient's care.  11/7 04/13/2022 3:55 PM

## 2022-04-13 NOTE — Progress Notes (Signed)
Progress Note   Patient: Kimberly Cook:[id number] DOB: 12/04/1945 DOA: 04/11/2022     0 DOS: the patient was seen and examined on 04/13/2022   Brief hospital course: 76 y.o. female with medical history significant for   anxiety, osteoarthritis, chronic HFrEF, stage 3A CKD, CAD, DDD s/p spinal surgery with hardware in place and ongoing lumbar pain with radiculopathy, depression, essential hypertension, GERD, hypothyroidism and dyslipidemia, who presented to the ER on 04/11/2022 after a suspected episode of syncope and a fall in which she sustained a right supraorbital laceration and hematoma.  Laceration was repaired in the ED.  She was reportedly found on the ground, fall was unwitnessed.  Pt reported not been feeling well lately and has been somnolent.  A UA was consistent with UTI the day before presenting, when she was seen by urology and had in office cystoscopy performed.  Urology plans to do cystoscopy in the OR for bladder biopsy in the near future for bladder wall thickening.    ED course -- BP 99/70 with otherwise stable vitals.  Labs mostly unremarkable, K 3.3, Cr 1.13 near baseline. CBC was normal.  Negative Covid-19 PCR.  Troponin was 3.  EKG with NSR 73 bpm, T wave inversions anteroseptal and laterally.  CXR was non-acute.  CT's of face and C-spine with no fractures or traumatic injuries seen other than right front scalp hematoma.  Admitted for observation and further evaluation.  9/8: patient with significant orthostatic hypotension and symptomatic.  Started on midodrine.  Discussed antibiotics with urology, recommended only treating if symptomatic.  Stopped antibiotics and monitoring clinically for now.  Assessment and Plan: * Syncope Suspect due to orthostatic hypotension.   9/8: positive orthostatic vitals and BP's overall soft at rest 9/9: still low BP's and dizzy when upright --orthostatic vitals at least daily --started on midodrine, increased to 10 mg TID --resume  antibiotics given persistent hypotension, frequency reported --telemetry monitoring --monitor off IV fluids, hx of CHF on torsemide --reduce torsemide to 20 mg daily  Most recent 2D echo 03/01/22 showed LVEF 45 to 50% with indeterminate diastolic function.  Carotid doppler U/S negative for significant stenosis of ICA's bilaterally.      Recurrent UTI Pt followed by urology closely, just seen day before for in office cysto. Differential diagnosis for her abnormal UA includes CIS and chronic cystitis from colonization.   Started on Unasyn on admission  given her syncope and generalized weakness, as well as altered mental status with somnolence.   Antibiotics were held to monitor clinically.  9/9 - Pt has had persistent hypotension, with baseline normal BP's, also with urinary frequency. --Resume antibiotics - meropenem based on most recent urine culture again with ESBL. --Monitor fever curve, CBC and for symptoms of UTI   Hypokalemia K normalized after replacment. Monitor BMP, replace K for goal > 4.0  Headache Likely due to her fall, laceration and forehead hematoma.   --Repeat head CT today to ensure to intracranial bleeding. --Fioricet PRN if not relieved by Tylenol  Hypotension BP's remain low despite midodrine 5 mg TID started yesterday.  Had single dose so far, but BP overnight was 89/45, this AM 101/63 at 0430. --Continue midodrine to 10 mg TID WC --Resume antibiotics for UTI --Hope to avoid stopping Coreg or Entresto given her CHF  Peripheral neuropathy --continue Lyrica  HFrEF (heart failure with reduced ejection fraction) (HCC) -- Continue Coreg, Entresto Farxiga, torsemide -- Reduce torsemide to 20 mg given hypotension and orthostatic symptoms causing falls --Monitor volume  status closely  CKD Stage IIIa Given gentle IV hydration on admission. --Monitor BMP  Anxiety and depression --continue Xanax and Lexapro  Essential hypertension Presented hypotensive  and having symptomatic orthostatic hypotension. BP-lowering meds are indicated for cardiac issues. --Continue low dose Coreg, Entresto, torsemide --Started on midodrine --Torsemide reduced to 20 mg daily  GERD (gastroesophageal reflux disease) --continue PPI   Hyperlipidemia --continue statin         Subjective: Pt awake resting in bed this AM.  She reports a bad and worsening headache that has been persistent, not relieved yet by pain medication given about an hour prior.  She endorses having more urinary frequency.  She stays cold much of time, but denies feeling feverish.     Physical Exam: Vitals:   04/13/22 0429 04/13/22 0500 04/13/22 0906 04/13/22 1204  BP: 101/63  (!) 107/52 105/64  Pulse: 71  65 66  Resp: 16  16 17   Temp: 98.1 F (36.7 C)  97.7 F (36.5 C) 98.6 F (37 C)  TempSrc:      SpO2: 99%  99% 99%  Weight:  83.3 kg    Height:       General exam: awake, alert, no acute distress HEENT: Right forehead laceration sutured and not bleeding, superior to laceration is a dark hematoma, moist mucus membranes, hearing grossly normal  Respiratory system: CTAB, no wheezes, rales or rhonchi, normal respiratory effort. Cardiovascular system: normal S1/S2,  RRR, no pedal edema.   Gastrointestinal system: soft, non-distended, mild suprapubic tenderness Central nervous system: A&O x4. no gross focal neurologic deficits, normal speech Extremities: no edema, normal tone Skin: forehead ecchymosis stable and beginning to improve, dry, intact, normal temperature Psychiatry: normal mood, congruent affect, judgement and insight appear normal   Data Reviewed:  Notable labs -- K normalized 3.7, Cr 1.25 >> 1.22, Hbg stable 11.4, WBC 3.8 from 5.2, platelets 145k  Family Communication: none at bedside, will attempt to call   Disposition: Status is: Observation  Remains in the hospital with persistent hypotension, symptomatic orthostasis, resumed on IV antibiotic for ESBL  UTI    Planned Discharge Destination:  SNF vs LTC at [facility]    Time spent: 35 minutes  Author: [facility], DO 04/13/2022 4:11 PM  For on call review [email or web address].

## 2022-04-13 NOTE — Plan of Care (Signed)

## 2022-04-14 ENCOUNTER — Observation Stay: Payer: Medicare HMO

## 2022-04-14 DIAGNOSIS — N390 Urinary tract infection, site not specified: Secondary | ICD-10-CM | POA: Diagnosis not present

## 2022-04-14 DIAGNOSIS — R55 Syncope and collapse: Secondary | ICD-10-CM | POA: Diagnosis not present

## 2022-04-14 DIAGNOSIS — M4325 Fusion of spine, thoracolumbar region: Secondary | ICD-10-CM | POA: Diagnosis not present

## 2022-04-14 DIAGNOSIS — I5020 Unspecified systolic (congestive) heart failure: Secondary | ICD-10-CM | POA: Diagnosis not present

## 2022-04-14 DIAGNOSIS — E876 Hypokalemia: Secondary | ICD-10-CM | POA: Diagnosis not present

## 2022-04-14 DIAGNOSIS — M79662 Pain in left lower leg: Secondary | ICD-10-CM | POA: Diagnosis not present

## 2022-04-14 DIAGNOSIS — M79661 Pain in right lower leg: Secondary | ICD-10-CM | POA: Diagnosis not present

## 2022-04-14 DIAGNOSIS — I959 Hypotension, unspecified: Secondary | ICD-10-CM | POA: Diagnosis not present

## 2022-04-14 LAB — URINE CULTURE: Culture: >=100000 — AB

## 2022-04-14 LAB — PROCALCITONIN: Procalcitonin: 0.3 ng/mL

## 2022-04-14 LAB — BASIC METABOLIC PANEL
Anion gap: 7 (ref 5–15)
BUN: 28 mg/dL — ABNORMAL HIGH (ref 8–23)
CO2: 30 mmol/L (ref 22–32)
Calcium: 8.9 mg/dL (ref 8.9–10.3)
Chloride: 101 mmol/L (ref 98–111)
Creatinine, Ser: 1.14 mg/dL — ABNORMAL HIGH (ref 0.44–1.00)
GFR, Estimated: 50 mL/min — ABNORMAL LOW (ref >60)
Glucose, Bld: 91 mg/dL (ref 70–99)
Potassium: 3.8 mmol/L (ref 3.5–5.1)
Sodium: 138 mmol/L (ref 135–145)

## 2022-04-14 LAB — CBC WITH DIFFERENTIAL/PLATELET
Abs Immature Granulocytes: 0.01 10*3/uL (ref 0.00–0.07)
Basophils Absolute: 0 10*3/uL (ref 0.0–0.1)
Basophils Relative: 0 %
Eosinophils Absolute: 0.1 10*3/uL (ref 0.0–0.5)
Eosinophils Relative: 1 %
HCT: 31.6 % — ABNORMAL LOW (ref 36.0–46.0)
Hemoglobin: 10.5 g/dL — ABNORMAL LOW (ref 12.0–15.0)
Immature Granulocytes: 0 %
Lymphocytes Relative: 24 %
Lymphs Abs: 1.1 10*3/uL (ref 0.7–4.0)
MCH: 31 pg (ref 26.0–34.0)
MCHC: 33.2 g/dL (ref 30.0–36.0)
MCV: 93.2 fL (ref 80.0–100.0)
Monocytes Absolute: 0.4 10*3/uL (ref 0.1–1.0)
Monocytes Relative: 9 %
Neutro Abs: 3 10*3/uL (ref 1.7–7.7)
Neutrophils Relative %: 66 %
Platelets: 151 10*3/uL (ref 150–400)
RBC: 3.39 MIL/uL — ABNORMAL LOW (ref 3.87–5.11)
RDW: 12.8 % (ref 11.5–15.5)
WBC: 4.6 10*3/uL (ref 4.0–10.5)
nRBC: 0 % (ref 0.0–0.2)

## 2022-04-14 LAB — GLUCOSE, CAPILLARY
Glucose-Capillary: 94 mg/dL (ref 70–99)
Glucose-Capillary: 125 mg/dL — ABNORMAL HIGH (ref 70–99)

## 2022-04-14 MED ORDER — METOPROLOL TARTRATE 25 MG PO TABS
12.5000 mg | ORAL_TABLET | Freq: Two times a day (BID) | ORAL | Status: DC
  Administered 2022-04-14 (×2): 12.5 mg via ORAL
  Filled 2022-04-14 (×2): qty 1

## 2022-04-14 NOTE — Assessment & Plan Note (Addendum)
Hx of extensive spine surgery 1.5 year ago for severe scoliosis, lumbar stenosis, degenerate disc disease.  Has ongoing pain since, in lower back and down both legs, left > right.  Left leg now feels very heavy, per pt. --NO DVT's seen on duplex U/S of BLE's --Neurosurgery updated, ordered xrays to assess stability of hardware --Follow up appt scheduled 05/09/22  --Palliative care consulted, pt likely would benefit to be followed after d/c for symptom management given her ongoing chronic pain and other co-morbidities

## 2022-04-14 NOTE — Progress Notes (Signed)
Progress Note   Patient: Kimberly Cook QIO:[id number] DOB: [birth year] DOA: 04/11/2022     0 DOS: the patient was seen and examined on 04/15/2022   Brief hospital course: 76 y.o. female with medical history significant for   anxiety, osteoarthritis, chronic HFrEF, stage 3A CKD, CAD, DDD s/p spinal surgery with hardware in place and ongoing lumbar pain with radiculopathy, depression, essential hypertension, GERD, hypothyroidism and dyslipidemia, who presented to the ER on 04/11/2022 after a suspected episode of syncope and a fall in which she sustained a right supraorbital laceration and hematoma.  Laceration was repaired in the ED.  She was reportedly found on the ground, fall was unwitnessed.  Pt reported not been feeling well lately and has been somnolent.  A UA was consistent with UTI the day before presenting, when she was seen by urology and had in office cystoscopy performed.  Urology plans to do cystoscopy in the OR for bladder biopsy in the near future for bladder wall thickening.    ED course -- BP 99/70 with otherwise stable vitals.  Labs mostly unremarkable, K 3.3, Cr 1.13 near baseline. CBC was normal.  Negative Covid-19 PCR.  Troponin was 3.  EKG with NSR 73 bpm, T wave inversions anteroseptal and laterally.  CXR was non-acute.  CT's of face and C-spine with no fractures or traumatic injuries seen other than right front scalp hematoma.  Admitted for observation and further evaluation.  9/8: patient with significant orthostatic hypotension and symptomatic.  Started on midodrine.  Discussed antibiotics with urology, recommended only treating if symptomatic.  Stopped antibiotics and monitoring clinically for now.  9/9-10: still hypotensive despite max dose midodrine, adjusting CHF medications  Assessment and Plan: * Syncope Suspect due to orthostatic hypotension.   9/8: positive orthostatic vitals and BP's overall soft at rest 9/9: still low BP's and dizzy when upright 9/10: resting  BP"s 80's/50's, ongoing orthostatic symptoms when upright --orthostatic vitals at least daily --continue midodrine 10 mg TID --changes to CHF meds as outlined below --resumed antibiotics given pt reporting frequency, hypotension --telemetry monitoring --reduce torsemide to 20 mg daily  Most recent 2D echo 03/01/22 showed LVEF 45 to 50% with indeterminate diastolic function.  Carotid doppler U/S negative for significant stenosis of ICA's bilaterally.      HFrEF (heart failure with reduced ejection fraction) (HCC) Pt was changed from losartan to Entresto around 03/28/22.  This seems to have dramatically impacted her BP. --D/C Entresto --Change Coreg to low dose Lopressor --Hold torsemide for now, reduced dose to 20 mg --Cardiology consulted, appreciate input -- Continue Coreg, Sherryll Burger Farxiga, torsemide --Resume low dose losartan 12.5 mg when BP will tolerate --Monitor volume status closely --Daily weights  Recurrent UTI Pt followed by urology closely, just seen day before for in office cysto. Differential diagnosis for her abnormal UA includes CIS and chronic cystitis from colonization.   Started on Unasyn on admission  given her syncope and generalized weakness, as well as altered mental status with somnolence.   Antibiotics were held to monitor clinically.  9/9 - Pt has had persistent hypotension, with baseline normal BP's, also with urinary frequency. --Resume antibiotics - meropenem based on most recent urine culture again with ESBL. --Monitor fever curve, CBC and for symptoms of UTI   Hypokalemia K normalized after replacment. Monitor BMP, replace K for goal > 4.0  Headache Likely due to her fall, laceration and forehead hematoma.   --Repeat head CT today to ensure to intracranial bleeding. --Fioricet PRN if not relieved by  Tylenol  Hypotension BP's remain low despite midodrine 5 mg TID started yesterday.  Had single dose so far, but BP overnight was 89/45, this AM  101/63 at 0430. --Continue midodrine to 10 mg TID WC --Resume antibiotics for UTI --Hope to avoid stopping Coreg or Entresto given her CHF  Peripheral neuropathy --continue Lyrica  Lumbar radiculopathy Hx of extensive spine surgery 1.5 year ago for severe scoliosis, lumbar stenosis, degenerate disc disease.  Has ongoing pain since, in lower back and down both legs, left > right.  Left leg now feels very heavy, per pt. --Rule out DVT's with duplex U/S --Neurosurgery updated, ordered xrays to assess stability of hardware  CKD Stage IIIa Given gentle IV hydration on admission. --Monitor BMP  Anxiety and depression --continue Xanax and Lexapro  Essential hypertension Presented hypotensive and having symptomatic orthostatic hypotension. BP-lowering meds are indicated for cardiac issues. --Continue low dose Coreg, Entresto, torsemide --Started on midodrine --Torsemide reduced to 20 mg daily  GERD (gastroesophageal reflux disease) --continue PPI   Hyperlipidemia --continue statin         Subjective: Pt awake resting in bed, reports feeling terrible, weak and very fatigued.  Got very dizzy upon sitting upright earlier.  Has ongoing headache that has not improved since her fall.     Physical Exam: Vitals:   04/14/22 2109 04/15/22 0559 04/15/22 0600 04/15/22 0750  BP: 98/70  96/67 (!) 87/49  Pulse: 63  62 (!) 59  Resp: 18  17 17   Temp: (!) 97.3 F (36.3 C)  98 F (36.7 C) 98 F (36.7 C)  TempSrc:   Oral   SpO2: 95%  96% 98%  Weight:  84.8 kg    Height:       General exam: awake, alert, no acute distress HEENT: Right forehead laceration sutured and not bleeding, improving ecchymosis above lac, moist mucus membranes, hearing grossly normal  Respiratory system: CTAB but diminished, no wheezes, normal respiratory effort. Cardiovascular system: normal S1/S2,  RRR, no peripheral edema.   Gastrointestinal system: suprapubic tenderness, non-distended, +bowel sounds Central  nervous system: A&O x4. no gross focal neurologic deficits, normal speech Extremities: no edema, normal tone Psychiatry: normal mood, congruent affect, judgement and insight appear normal   Data Reviewed:  Notable labs -- Cr 1.25 >> 1.22 >> 1.14, Hbg stable 10.5  Family Communication: son and daughter-in-law at bedside  Disposition: Status is: Observation  Remains in the hospital with persistent hypotension systolic 80's at rest, medication changes underway, symptomatic orthostasis, resumed on IV antibiotic for ESBL UTI    Planned Discharge Destination:  SNF vs LTC at [facility]    Time spent: 35 minutes  Author: [facility], DO 04/15/2022 8:38 AM  For on call review [email or web address].

## 2022-04-15 DIAGNOSIS — M419 Scoliosis, unspecified: Secondary | ICD-10-CM | POA: Diagnosis not present

## 2022-04-15 DIAGNOSIS — F32A Depression, unspecified: Secondary | ICD-10-CM | POA: Diagnosis present

## 2022-04-15 DIAGNOSIS — E876 Hypokalemia: Secondary | ICD-10-CM | POA: Diagnosis present

## 2022-04-15 DIAGNOSIS — N1831 Chronic kidney disease, stage 3a: Secondary | ICD-10-CM | POA: Diagnosis present

## 2022-04-15 DIAGNOSIS — Z23 Encounter for immunization: Secondary | ICD-10-CM | POA: Diagnosis present

## 2022-04-15 DIAGNOSIS — Z66 Do not resuscitate: Secondary | ICD-10-CM | POA: Diagnosis present

## 2022-04-15 DIAGNOSIS — I5022 Chronic systolic (congestive) heart failure: Secondary | ICD-10-CM | POA: Diagnosis present

## 2022-04-15 DIAGNOSIS — K219 Gastro-esophageal reflux disease without esophagitis: Secondary | ICD-10-CM | POA: Diagnosis present

## 2022-04-15 DIAGNOSIS — W19XXXA Unspecified fall, initial encounter: Secondary | ICD-10-CM | POA: Diagnosis not present

## 2022-04-15 DIAGNOSIS — N390 Urinary tract infection, site not specified: Secondary | ICD-10-CM | POA: Diagnosis present

## 2022-04-15 DIAGNOSIS — I951 Orthostatic hypotension: Secondary | ICD-10-CM | POA: Diagnosis present

## 2022-04-15 DIAGNOSIS — E039 Hypothyroidism, unspecified: Secondary | ICD-10-CM | POA: Diagnosis present

## 2022-04-15 DIAGNOSIS — F419 Anxiety disorder, unspecified: Secondary | ICD-10-CM | POA: Diagnosis not present

## 2022-04-15 DIAGNOSIS — M5416 Radiculopathy, lumbar region: Secondary | ICD-10-CM | POA: Diagnosis not present

## 2022-04-15 DIAGNOSIS — S0181XA Laceration without foreign body of other part of head, initial encounter: Secondary | ICD-10-CM | POA: Diagnosis not present

## 2022-04-15 DIAGNOSIS — Z1612 Extended spectrum beta lactamase (ESBL) resistance: Secondary | ICD-10-CM | POA: Diagnosis present

## 2022-04-15 DIAGNOSIS — Z515 Encounter for palliative care: Secondary | ICD-10-CM | POA: Diagnosis not present

## 2022-04-15 DIAGNOSIS — M549 Dorsalgia, unspecified: Secondary | ICD-10-CM

## 2022-04-15 DIAGNOSIS — I130 Hypertensive heart and chronic kidney disease with heart failure and stage 1 through stage 4 chronic kidney disease, or unspecified chronic kidney disease: Secondary | ICD-10-CM | POA: Diagnosis present

## 2022-04-15 DIAGNOSIS — E785 Hyperlipidemia, unspecified: Secondary | ICD-10-CM | POA: Diagnosis present

## 2022-04-15 DIAGNOSIS — Z96652 Presence of left artificial knee joint: Secondary | ICD-10-CM | POA: Diagnosis present

## 2022-04-15 DIAGNOSIS — I5020 Unspecified systolic (congestive) heart failure: Secondary | ICD-10-CM | POA: Diagnosis not present

## 2022-04-15 DIAGNOSIS — R55 Syncope and collapse: Secondary | ICD-10-CM | POA: Diagnosis present

## 2022-04-15 DIAGNOSIS — Z20822 Contact with and (suspected) exposure to covid-19: Secondary | ICD-10-CM | POA: Diagnosis present

## 2022-04-15 DIAGNOSIS — I959 Hypotension, unspecified: Secondary | ICD-10-CM | POA: Diagnosis not present

## 2022-04-15 LAB — GLUCOSE, CAPILLARY: Glucose-Capillary: 120 mg/dL — ABNORMAL HIGH (ref 70–99)

## 2022-04-15 MED ORDER — METOPROLOL SUCCINATE ER 25 MG PO TB24
12.5000 mg | ORAL_TABLET | Freq: Every day | ORAL | Status: DC
  Filled 2022-04-15: qty 1

## 2022-04-15 MED ORDER — CLOTRIMAZOLE 1 % VA CREA
1.0000 | TOPICAL_CREAM | Freq: Every day | VAGINAL | Status: DC
  Administered 2022-04-16: 1 via VAGINAL
  Filled 2022-04-15: qty 45

## 2022-04-15 MED ORDER — SODIUM CHLORIDE 0.9 % IV BOLUS
250.0000 mL | Freq: Once | INTRAVENOUS | Status: AC
  Administered 2022-04-15: 250 mL via INTRAVENOUS

## 2022-04-15 MED ORDER — HYDROXYZINE HCL 10 MG/5ML PO SYRP
10.0000 mg | ORAL_SOLUTION | Freq: Three times a day (TID) | ORAL | Status: DC | PRN
  Administered 2022-04-15: 10 mg via ORAL
  Filled 2022-04-15 (×3): qty 5

## 2022-04-15 NOTE — TOC Progression Note (Addendum)
Transition of Care [facility]) - Progression Note    Patient Details  Name: Kimberly Cook MRN: [id number] Date of Birth: [birth year]  Transition of Care [facility]) CM/SW Contact  Truddie Hidden, RN Phone Number: 04/15/2022, 3:10 PM  Clinical Narrative:    Message retrieved from [facility] forwarded 9/10 regarding update from [facility]. Case was sent to smedical director and more clinical information was requested. Messgage left to contact [phone] option #3.  Contacted [phone] option #3. Spoke with service coordinator Misty Stanley. Information was previously sent to [employer] phone requesting peer to peer. Advised Octavia Heir was not working today and this CM would not have received that information. Misty Stanley provided number  for attending MD to call for peer to peer @ [phone] option#5.   MD notified where to call for peer to peer and deadline of 3pm.   4:21pm Message retrieved from [facility] Rep, [place] [phone] option #3. No skilled needs for short term rehab. Aundra Millet provided the following information: Fast appeals call [phone] Standard appeals [phone]. For more information        Expected Discharge Plan: Skilled Nursing Facility Barriers to Discharge: Continued Medical Work up  Expected Discharge Plan and Services Expected Discharge Plan: Skilled Nursing Facility     Post Acute Care Choice: Skilled Nursing Facility Living arrangements for the past 2 months: Skilled Nursing Facility                                       Social Determinants of Health (SDOH) Interventions    Readmission Risk Interventions     No data to display

## 2022-04-15 NOTE — Progress Notes (Signed)
Physical Therapy Treatment Patient Details Name: Kimberly Cook MRN: [id number] DOB: 02/24/1946 Today's Date: 04/15/2022   History of Present Illness Pt is 29 YOF admitted for syncope w fall, recurrent UTI w AMS, and hypokalemia. PMH includes: peripheral neuropathy, HFrEF, CKD3a, depression, HTN, GERD, HLD, and acquired scoliosis.    PT Comments    Pt presents to PT in bed and requires some encouragement to participate in therapy services. Pt reports dizziness upon sup>sit transfer, which did not resolve with time and pt reports symptoms incr slightly w subsequent mobility. Orthostatics as follows: sup: 102/68 mmHg, sit: 94/68 mmHg, stand:85/50 mmHg. Pt able to amb from EOB to chair w no LOB and no physical assistance required. Further amb deferred secondary to symptomatic drop in BP. Would benefit from skilled PT at SNF to address above deficits in strength, functional mobility, balance, and activity tolerance to promote optimal return to PLOF.   Recommendations for follow up therapy are one component of a multi-disciplinary discharge planning process, led by the attending physician.  Recommendations may be updated based on patient status, additional functional criteria and insurance authorization.  Follow Up Recommendations  Skilled nursing-short term rehab (<3 hours/day) Can patient physically be transported by private vehicle: Yes   Assistance Recommended at Discharge Intermittent Supervision/Assistance  Patient can return home with the following A little help with walking and/or transfers;A little help with bathing/dressing/bathroom;Assistance with cooking/housework;Assistance with feeding;Direct supervision/assist for medications management;Assist for transportation;Direct supervision/assist for financial management   Equipment Recommendations  None recommended by PT    Recommendations for Other Services       Precautions / Restrictions Precautions Precautions:  Fall Restrictions Weight Bearing Restrictions: No     Mobility  Bed Mobility Overal bed mobility: Needs Assistance Bed Mobility: Supine to Sit     Supine to sit: Supervision     General bed mobility comments: incr time and effort to perform sup>sit    Transfers Overall transfer level: Needs assistance Equipment used: Rolling walker (2 wheels) Transfers: Sit to/from Stand Sit to Stand: Min guard                Ambulation/Gait Ambulation/Gait assistance: Min guard Gait Distance (Feet): 4 Feet Assistive device: Rolling walker (2 wheels) Gait Pattern/deviations: Step-through pattern, Shuffle, Trunk flexed Gait velocity: dec         Stairs             Wheelchair Mobility    Modified Rankin (Stroke Patients Only)       Balance Overall balance assessment: Needs assistance Sitting-balance support: Feet supported Sitting balance-Leahy Scale: Good     Standing balance support: Bilateral upper extremity supported, Reliant on assistive device for balance Standing balance-Leahy Scale: Fair                              Cognition Arousal/Alertness: Awake/alert Behavior During Therapy: WFL for tasks assessed/performed Overall Cognitive Status: Within Functional Limits for tasks assessed                                          Exercises  LAQs: seated, Both, 10x Pt edu on physiologic benefits of activity   General Comments General comments (skin integrity, edema, etc.): Hematoma and laceration in R forehead      Pertinent Vitals/Pain Pain Assessment Pain Assessment: 0-10 Pain Score: 8  Pain Descriptors /  Indicators: Aching, Constant Pain Intervention(s): Limited activity within patient's tolerance, Monitored during session, Patient requesting pain meds-RN notified    Home Living                          Prior Function            PT Goals (current goals can now be found in the care plan section) Acute  Rehab PT Goals Patient Stated Goal: " I can to go [facility] to [facility] and get better there again." PT Goal Formulation: With patient Time For Goal Achievement: 04/26/22 Potential to Achieve Goals: Good Progress towards PT goals: Progressing toward goals    Frequency    Min 2X/week      PT Plan Current plan remains appropriate    Co-evaluation              AM-PAC PT "6 Clicks" Mobility   Outcome Measure  Help needed turning from your back to your side while in a flat bed without using bedrails?: A Little Help needed moving from lying on your back to sitting on the side of a flat bed without using bedrails?: A Little Help needed moving to and from a bed to a chair (including a wheelchair)?: A Little Help needed standing up from a chair using your arms (e.g., wheelchair or bedside chair)?: A Little Help needed to walk in hospital room?: A Little Help needed climbing 3-5 steps with a railing? : A Lot 6 Click Score: 17    End of Session Equipment Utilized During Treatment: Gait belt Activity Tolerance: Patient limited by fatigue;Patient limited by pain;Patient tolerated treatment well Patient left: in chair;with call bell/phone within reach;with chair alarm set Nurse Communication: Mobility status, BP results per above, MD notified pt seemed depressed during session PT Visit Diagnosis: Unsteadiness on feet (R26.81);Muscle weakness (generalized) (M62.81);History of falling (Z91.81);Dizziness and giddiness (R42);Pain Pain - Right/Left: Right Pain - part of body:  (head)     Time: 1100-1133 PT Time Calculation (min) (ACUTE ONLY): 33 min  Charges:                        Odette Horns MPH, SPT 04/15/22, 11:54 AM

## 2022-04-15 NOTE — Consult Note (Signed)
Referring Physician:  No referring provider defined for this encounter.  Primary Physician:  System, Provider Not In  History of Present Illness: 04/15/2022 Kimberly Cook is here today with a chief complaint of syncope and low blood pressure.  She also reports that she continues to have back pain.  She has had left leg pain and difficulty with the left leg feeling heavy.  This has caused issues for her.  More recently she tried a new bra which caused her to feel as though there is something bunching up on the rods.  She is felt that the rods have possibly moved or popped over the past few weeks to months.  She is currently stable.  Overall, she is still better than she was prior to surgery.   Review of Systems:  A 10 point review of systems is negative, except for the pertinent positives and negatives detailed in the HPI.  Past Medical History: Past Medical History:  Diagnosis Date   Allergy    Anxiety    Arthritis    Back pain    Chronic HFrEF (heart failure with reduced ejection fraction) ([place])    a. 08/2021 echo: EF 30-35%, glob HK, GrI DD; b. 10/2021 Echo: EF 30-35%.   CKD (chronic kidney disease), stage II    Coronary artery disease    a. Mild to moderate CAD in LAD/diagonal by CTA (CT-FFR of apical LAD 0.79); b. 08/2021 Cath: LM nl, LAD min irregs, D1/2/3 nl, LCX nl, OM2/3 nl, RCA 20p, RPDA mild dzs, RPAV nl.   DDD (degenerative disc disease), lumbar    Depression    Diverticulosis    Essential hypertension    GERD (gastroesophageal reflux disease)    Headache    History of shingles    Hyperlipidemia    Hypothyroidism    Lung nodule    Mini stroke 2011   Moderate Pericardial effusion    a. 08/2021 Echo: moderate circumferential pericardial effusion w/o tamponade; b. 10/2021 Echo: EF 30-35%, small to mod circumferential pericardial effusion w/o tamponade.   NICM (nonischemic cardiomyopathy) ([place])    a. 08/2021 Echo: EF 30-35%, glob HK, GrI DD, nl RV fxn, mild-mod  dil LA, mod circumferential pericardial eff w/o tamponade, Mod MR; b. 10/2021 Echo: EF 30-35%, glob HK.   Occipital neuralgia    Palpitations    Pleural effusion on left    a. 08/2021 s/p thoracentesis.   Pneumonia 2018   Prediabetes    Stroke [facility])    Stroke ([place])    MRI 04/2008 + left sup. frontal gyrus possibly puntate infarct    Syncope 2019   Urinary tract infection    Vitamin D deficiency     Past Surgical History: Past Surgical History:  Procedure Laterality Date   ABDOMINAL HYSTERECTOMY     BLADDER SURGERY     2003   BREAST EXCISIONAL BIOPSY Right Over 20 years    Benign   CHOLECYSTECTOMY     gastroplication      JOINT REPLACEMENT Left    KNEE   KNEE ARTHROSCOPY Left 2011   PULSE GENERATOR IMPLANT Left 01/31/2020   Procedure: PLACEMENT RIGHT FLANK PULSE GENERATOR VS REMOVAL SPINAL CORD STIMULATOR;  Surgeon: Deetta Perla, MD;  Location: [facility] OR[facility];  Service: Neurosurgery;  Laterality: Left;   PULSE GENERATOR IMPLANT Left 04/24/2020   Procedure: REPLACEMENT LEFT FLANK PULSE GENERATOR IMPLANT;  Surgeon: Deetta Perla, MD;  Location: [facility] OR[facility];  Service: Neurosurgery;  Laterality: Left;  MAC w/ local  right arm fracture     RIGHT/LEFT HEART CATH AND CORONARY ANGIOGRAPHY N/A 08/28/2021   Procedure: RIGHT/LEFT HEART CATH AND CORONARY ANGIOGRAPHY;  Surgeon: Wellington Hampshire, MD;  Location: [place] [facility];  Service: Cardiovascular;  Laterality: N/A;   SPINAL CORD STIMULATOR REMOVAL N/A 06/26/2020   Procedure: SPINAL CORD STIMULATOR REMOVAL;  Surgeon: Deetta Perla, MD;  Location: [facility];  Service: Neurosurgery;  Laterality: N/A;   THORACIC LAMINECTOMY FOR SPINAL CORD STIMULATOR N/A 01/24/2020   Procedure: THORACIC SPINAL CORD STIMULATOR PADDLE TRIAL VIA LAMINECTOMY;  Surgeon: Deetta Perla, MD;  Location: [facility] OR[facility];  Service: Neurosurgery;  Laterality: N/A;   TONSILLECTOMY AND ADENOIDECTOMY     TOTAL KNEE ARTHROPLASTY Left 04/17/2015   Procedure: LEFT TOTAL KNEE ARTHROPLASTY;   Surgeon: Paralee Cancel, MD;  Location: [facility];  Service: Orthopedics;  Laterality: Left;    Allergies: Allergies as of 04/11/2022   (No Known Allergies)    Medications: Current Meds  Medication Sig   acetaminophen (TYLENOL) 500 MG tablet Take 1,000 mg by mouth every 8 (eight) hours as needed for moderate pain.   alendronate (FOSAMAX) 70 MG tablet Take 70 mg by mouth once a week.    ALPRAZolam (XANAX) 0.5 MG tablet Take 1 tablet (0.5 mg total) by mouth 2 (two) times daily as needed for anxiety.   ascorbic acid (VITAMIN C) 500 MG tablet Take 500 mg by mouth daily.   aspirin EC 81 MG tablet Take 1 tablet (81 mg total) by mouth at bedtime. Swallow whole.   atorvastatin (LIPITOR) 20 MG tablet Take 1 tablet (20 mg total) by mouth daily.   Biotin 1 MG CAPS Take 1 mg by mouth daily.    Carboxymethylcellulose Sodium (REFRESH LIQUIGEL) 1 % GEL Place 1 drop into both eyes at bedtime.   carvedilol (COREG) 3.125 MG tablet Take 1 tablet (3.125 mg total) by mouth 2 (two) times daily with a meal. Hold for blood pressure less than 90/60   cetirizine (ZYRTEC) 5 MG tablet Take 5 mg by mouth daily.   Cholecalciferol (VITAMIN D3) 125 MCG (5000 UT) TABS Take 1 tablet (5,000 Units total) by mouth daily.   citalopram (CELEXA) 20 MG tablet Take 20 mg by mouth daily.   Cranberry 450 MG TABS Take 1 tablet by mouth every morning.   dapagliflozin propanediol (FARXIGA) 10 MG TABS tablet Take 1 tablet (10 mg total) by mouth daily before breakfast.   folic acid (FOLVITE) 536 MCG tablet Take 1 tablet (400 mcg total) by mouth daily. SEPARATE ALL SUPPLEMENTS TO LUNCH OR DINNER AND PRILOSEC NOT TO MESS W/THYROID MED   levothyroxine (SYNTHROID) 100 MCG tablet Take 1 tablet (100 mcg total) by mouth daily before breakfast.   lidocaine 4 % Place 1 patch onto the skin daily. Remove & Discard patch within 12 hours or as directed by MD   magnesium oxide (MAG-OX) 400 (240 Mg) MG tablet Take 1 tablet (400 mg total) by mouth daily.    melatonin 5 MG TABS Take 1 tablet (5 mg total) by mouth at bedtime. (Patient taking differently: Take 10 mg by mouth at bedtime.)   montelukast (SINGULAIR) 10 MG tablet Take 1 tablet (10 mg total) by mouth at bedtime.   Multiple Vitamin (MULTIVITAMIN WITH MINERALS) TABS tablet Take 1 tablet by mouth daily.   omeprazole (PRILOSEC) 40 MG capsule Take 1 capsule (40 mg total) by mouth daily. After lunch   polyethylene glycol powder (GLYCOLAX/MIRALAX) 17 GM/SCOOP powder Take 17 g by mouth daily as needed  for moderate constipation.   potassium chloride SA (KLOR-CON M) 20 MEQ tablet Take 1 tablet (20 mEq total) by mouth 2 (two) times daily.   pregabalin (LYRICA) 75 MG capsule Take 75 mg by mouth 3 (three) times daily.   sacubitril-valsartan (ENTRESTO) 24-26 MG Take 1 tablet by mouth 2 (two) times daily.   torsemide (DEMADEX) 20 MG tablet Take 40 mg by mouth daily.   traMADol (ULTRAM) 50 MG tablet Take 1 tablet (50 mg total) by mouth every 6 (six) hours as needed for moderate pain.   traZODone (DESYREL) 100 MG tablet Take 100 mg by mouth at bedtime.   vitamin B-12 (CYANOCOBALAMIN) 1000 MCG tablet Take 1 tablet (1,000 mcg total) by mouth daily.    Social History: Social History   Tobacco Use   Smoking status: Never    Passive exposure: Yes   Smokeless tobacco: Never   Tobacco comments:    husbands and children smoked in home.   Vaping Use   Vaping Use: Never used  Substance Use Topics   Alcohol use: No   Drug use: No    Family Medical History: Family History  Problem Relation Age of Onset   Heart disease Mother    Hypertension Mother    Diabetes Mother    Heart attack Mother 79   Heart disease Father    Heart attack Father 3   Breast cancer Maternal Aunt    Heart attack Brother     Physical Examination: Vitals:   04/14/22 2109 04/15/22 0600  BP: 98/70 96/67  Pulse: 63 62  Resp: 18 17  Temp: (!) 97.3 F (36.3 C) 98 F (36.7 C)  SpO2: 95% 96%    General: Patient is well  developed, well nourished, calm, collected, and in no apparent distress. Attention to examination is appropriate.  Neck:   Supple.    Respiratory: Patient is breathing without any difficulty.   NEUROLOGICAL:     Awake, alert, oriented to person, place, and time.  Speech is clear and fluent. Fund of knowledge is appropriate.   Cranial Nerves: Pupils equal round and reactive to light.  Facial tone is symmetric.  Facial sensation is symmetric. Shoulder shrug is symmetric. Tongue protrusion is midline.  There is no pronator drift.  ROM of spine: full.    Strength: 5/5 BUE and BLE  Reflexes are 1+ and symmetric at the biceps, triceps, brachioradialis, patella and achilles.   Hoffman's is absent.  Bilateral upper and lower extremity sensation is intact to light touch.    No evidence of dysmetria noted.  Gait is untested  Medical Decision Making  Imaging: Scoli xrays 04/14/22 IMPRESSION: No acute findings. Interval improvement in biphasic curvature of the thoracolumbar spine post fixation/fusion from T4 to the pelvis with stabilization hardware intact. The body fusion from L1-2 to L5-S1.     Electronically Signed   By: Marin Olp M.D.   On: 04/14/2022 15:43    I have personally reviewed the images and agree with the above interpretation.  Assessment and Plan: Ms. Geisel is a pleasant 76 y.o. female with history of extensive thoracolumbar intervention that appears well-healed.  She is currently here for medical issues unrelated to her primary surgery in June 2022.  She will continue to have management of her low blood pressure and syncope.  I recommended follow-up in the outpatient clinic.   Thank you for involving me in the care of this patient.      Amyrah Pinkhasov K. Izora Ribas MD, [facility] Neurosurgery

## 2022-04-15 NOTE — Progress Notes (Signed)
   04/15/22 1400  Clinical Encounter Type  Visited With Patient  Visit Type Initial;Spiritual support  Referral From Nurse  Consult/Referral To Chaplain  Spiritual Encounters  Spiritual Needs Emotional;Prayer;Grief support   Chaplain responded to [facility] consult to provide support to patient. Patient expressed her fears and articulated concerns and feelings. Chaplain engaged in meaningful conversation with patient.

## 2022-04-15 NOTE — Progress Notes (Signed)
Progress Note   Patient: Kimberly Cook WUJ:[id number] DOB: 08-16-1945 DOA: 04/11/2022     0 DOS: the patient was seen and examined on 04/15/2022   Brief hospital course: 76 y.o. female with medical history significant for   anxiety, osteoarthritis, chronic HFrEF, stage 3A CKD, CAD, DDD s/p spinal surgery with hardware in place and ongoing lumbar pain with radiculopathy, depression, essential hypertension, GERD, hypothyroidism and dyslipidemia, who presented to the ER on 04/11/2022 after a suspected episode of syncope and a fall in which she sustained a right supraorbital laceration and hematoma.  Laceration was repaired in the ED.  She was reportedly found on the ground, fall was unwitnessed.  Pt reported not been feeling well lately and has been somnolent.  A UA was consistent with UTI the day before presenting, when she was seen by urology and had in office cystoscopy performed.  Urology plans to do cystoscopy in the OR for bladder biopsy in the near future for bladder wall thickening.    ED course -- BP 99/70 with otherwise stable vitals.  Labs mostly unremarkable, K 3.3, Cr 1.13 near baseline. CBC was normal.  Negative Covid-19 PCR.  Troponin was 3.  EKG with NSR 73 bpm, T wave inversions anteroseptal and laterally.  CXR was non-acute.  CT's of face and C-spine with no fractures or traumatic injuries seen other than right front scalp hematoma.  Admitted for observation and further evaluation.  9/8: patient with significant orthostatic hypotension and symptomatic.  Started on midodrine.  Discussed antibiotics with urology, recommended only treating if symptomatic.  Stopped antibiotics and monitoring clinically for now.  9/9-11: still hypotensive despite max dose midodrine, adjusting CHF medications  Assessment and Plan: * Syncope Suspect due to orthostatic hypotension.   9/8: positive orthostatic vitals and BP's overall soft at rest 9/9: still low BP's and dizzy when upright 9/10: resting  BP"s 80's/50's, ongoing orthostatic symptoms when upright --orthostatic vitals at least daily --continue midodrine 10 mg TID --changes to CHF meds as outlined below --resumed antibiotics given pt reporting frequency, hypotension --telemetry monitoring --reduce torsemide to 20 mg daily  Most recent 2D echo 03/01/22 showed LVEF 45 to 50% with indeterminate diastolic function.  Carotid doppler U/S negative for significant stenosis of ICA's bilaterally.      HFrEF (heart failure with reduced ejection fraction) (HCC) Pt was changed from losartan to Entresto around 03/28/22.  This seems to have dramatically impacted her BP. --D/C Entresto --Changed Coreg to low dose Toprol --Hold torsemide for now, reduced dose to 20 mg --Cardiology consulted, appreciate input --Resume low dose losartan 12.5 mg when BP will tolerate --Monitor volume status closely (250 cc bolus today for hypotension / orthostasis) --Daily weights  Recurrent UTI Pt followed by urology closely, just seen day before for in office cysto. Differential diagnosis for her abnormal UA includes CIS and chronic cystitis from colonization.   Started on Unasyn on admission  given her syncope and generalized weakness, as well as altered mental status with somnolence.   Antibiotics were held to monitor clinically.  9/9 - Pt has had persistent hypotension, with baseline normal BP's, also with urinary frequency. Resumed antibiotics  9/11 - stop abx, got total 3 days Urine culture grew pan-sensitive E coli   Hypokalemia K normalized after replacment. Monitor BMP, replace K for goal > 4.0  Acquired scoliosis S/p extensive surgery with hardware in place.   --Follow up with neurosurgery as scheduled    Headache Likely due to her fall, laceration and forehead hematoma.   --  Repeat head CT on 9/9 was negative --Fioricet PRN if not relieved by Tylenol  Hypotension BP's remain low despite midodrine 5 mg TID started yesterday.  Had  single dose so far, but BP overnight was 89/45, this AM 101/63 at 0430. --Continue midodrine to 10 mg TID WC --Completed 3 days antibiotics for UTI --Otherwise mgmt as outlined --Maintain MAP>65 --TED hose --250 cc fluid bolus and repeat orthostatic vitals - pending   Peripheral neuropathy --continue Lyrica  Lumbar radiculopathy Hx of extensive spine surgery 1.5 year ago for severe scoliosis, lumbar stenosis, degenerate disc disease.  Has ongoing pain since, in lower back and down both legs, left > right.  Left leg now feels very heavy, per pt. --Rule out DVT's with duplex U/S --Neurosurgery updated, ordered xrays to assess stability of hardware --Follow up appt scheduled 05/09/22   CKD Stage IIIa Given gentle IV hydration on admission. Renal function at baseline --Monitor BMP  Anxiety and depression --continue Xanax and Lexapro  Essential hypertension Presented hypotensive and having symptomatic orthostatic hypotension. --Holding Entresto, torsemide --Changed Coreg to lowest dose Toprol for less BP impact --Started on midodrine, on 10 mg TID   GERD (gastroesophageal reflux disease) --continue PPI   Hyperlipidemia --continue statin         Subjective: Pt awake resting in bed.  She is more down today about her overall condition.  Continues to feel very poorly in general.  Headache since her fall has been persistent, and other symptoms of concussion like profound fatigue.  Does not feel like working with therapy, but also says not ready to "give up".  Frustration about not being able to get out of the nursing home, would really like to be home.  Dizziness when upright remains pretty severe.  Back and leg pain also limits her.    Physical Exam: Vitals:   04/15/22 0750 04/15/22 1251 04/15/22 1254 04/15/22 1704  BP: (!) 87/49 (!) 100/57  (!) 105/50  Pulse: (!) 59 (!) 50 60 (!) 52  Resp: 17   17  Temp: 98 F (36.7 C)   97.9 F (36.6 C)  TempSrc:      SpO2: 98%   98%   Weight:      Height:       General exam: awake but drowsy, no acute distress HEENT: Right forehead laceration sutured and not bleeding, improving ecchymosis above lac, moist mucus membranes, hearing grossly normal  Respiratory system: CTAB but diminished, no wheezes, normal respiratory effort. Cardiovascular system: normal S1/S2,  RRR, no peripheral edema.   Central nervous system: A&O x4. no gross focal neurologic deficits, normal speech Extremities: no edema, normal tone Psychiatry: depressed mood, congruent affect, judgement and insight appear normal   Data Reviewed: CBG's at goal.  No other new labs today.  Family Communication: son and daughter-in-law at bedside on 9/10  Disposition: Status is: Inpatient Remains inpatient appropriate because:  Persistent hypotension systolic 80's at rest, medication changes underway, symptomatic orthostasis, getting IV fluids and requires further clinical improvement for safe discharge to prevent syncope and additional injuries.    Planned Discharge Destination:  SNF vs LTC at [facility]    Time spent: 35 minutes  Author: Pennie Banter, DO 04/15/2022 7:10 PM  For on call review [email or web address].

## 2022-04-15 NOTE — Assessment & Plan Note (Signed)
S/p extensive surgery with hardware in place.   --Follow up with neurosurgery as scheduled

## 2022-04-16 DIAGNOSIS — M5416 Radiculopathy, lumbar region: Secondary | ICD-10-CM | POA: Diagnosis not present

## 2022-04-16 DIAGNOSIS — R55 Syncope and collapse: Secondary | ICD-10-CM | POA: Diagnosis not present

## 2022-04-16 DIAGNOSIS — I5020 Unspecified systolic (congestive) heart failure: Secondary | ICD-10-CM | POA: Diagnosis not present

## 2022-04-16 DIAGNOSIS — I959 Hypotension, unspecified: Secondary | ICD-10-CM | POA: Diagnosis not present

## 2022-04-16 LAB — BASIC METABOLIC PANEL
Anion gap: 6 (ref 5–15)
BUN: 31 mg/dL — ABNORMAL HIGH (ref 8–23)
CO2: 27 mmol/L (ref 22–32)
Calcium: 9.1 mg/dL (ref 8.9–10.3)
Chloride: 107 mmol/L (ref 98–111)
Creatinine, Ser: 0.92 mg/dL (ref 0.44–1.00)
GFR, Estimated: >60 mL/min (ref >60)
Glucose, Bld: 88 mg/dL (ref 70–99)
Potassium: 4.5 mmol/L (ref 3.5–5.1)
Sodium: 140 mmol/L (ref 135–145)

## 2022-04-16 LAB — BRAIN NATRIURETIC PEPTIDE: B Natriuretic Peptide: 159.1 pg/mL — ABNORMAL HIGH (ref 0.0–100.0)

## 2022-04-16 MED ORDER — HYDROCODONE-ACETAMINOPHEN 5-325 MG PO TABS
1.0000 | ORAL_TABLET | Freq: Four times a day (QID) | ORAL | Status: DC | PRN
  Administered 2022-04-17 (×2): 1 via ORAL
  Filled 2022-04-16 (×2): qty 1

## 2022-04-16 MED ORDER — METOCLOPRAMIDE HCL 5 MG/ML IJ SOLN
10.0000 mg | Freq: Once | INTRAMUSCULAR | Status: AC
  Administered 2022-04-16: 10 mg via INTRAVENOUS
  Filled 2022-04-16: qty 2

## 2022-04-16 MED ORDER — SODIUM CHLORIDE 0.9 % IV BOLUS
250.0000 mL | Freq: Once | INTRAVENOUS | Status: AC
  Administered 2022-04-16: 250 mL via INTRAVENOUS

## 2022-04-16 NOTE — Progress Notes (Signed)
Physical Therapy Treatment Patient Details Name: Kimberly Cook MRN: [id number] DOB: Feb 15, 1946 Today's Date: 04/16/2022   History of Present Illness Pt is 70 YOF admitted for syncope w fall, recurrent UTI w AMS, and hypokalemia. PMH includes: peripheral neuropathy, HFrEF, CKD3a, depression, HTN, GERD, HLD, and acquired scoliosis.    PT Comments    Pt presents to PT in bed and requires encouragement to participate in therapy services.  Pt participated in supine and seated therapeutic exercises, as well as bed mobility, transfer, and amb practice. Pt limited by significant headache pain, nursing notified and administered pain medication. Would benefit from skilled PT at SNF to address above deficits in strength, functional mobility, activity tolerance, and balance to promote optimal return to PLOF.   Recommendations for follow up therapy are one component of a multi-disciplinary discharge planning process, led by the attending physician.  Recommendations may be updated based on patient status, additional functional criteria and insurance authorization.  Follow Up Recommendations  Skilled nursing-short term rehab (<3 hours/day) Can patient physically be transported by private vehicle: Yes   Assistance Recommended at Discharge Intermittent Supervision/Assistance  Patient can return home with the following A little help with walking and/or transfers;A little help with bathing/dressing/bathroom;Assistance with cooking/housework;Assistance with feeding;Direct supervision/assist for medications management;Assist for transportation;Direct supervision/assist for financial management   Equipment Recommendations  None recommended by PT    Recommendations for Other Services       Precautions / Restrictions Precautions Precautions: Fall Restrictions Weight Bearing Restrictions: No     Mobility  Bed Mobility Overal bed mobility: Needs Assistance Bed Mobility: Supine to Sit     Supine to  sit: Supervision     General bed mobility comments: incr time and effort to perform sup>sit    Transfers Overall transfer level: Needs assistance Equipment used: Rolling walker (2 wheels) Transfers: Sit to/from Stand Sit to Stand: Min guard                Ambulation/Gait Ambulation/Gait assistance: Min guard Gait Distance (Feet): 4 Feet Assistive device: Rolling walker (2 wheels) Gait Pattern/deviations: Step-to pattern, Trunk flexed Gait velocity: dec     General Gait Details: LLE weakness, 4 steps to [facility]   Stairs             Wheelchair Mobility    Modified Rankin (Stroke Patients Only)       Balance Overall balance assessment: Needs assistance Sitting-balance support: Feet supported Sitting balance-Leahy Scale: Good     Standing balance support: Bilateral upper extremity supported, Reliant on assistive device for balance Standing balance-Leahy Scale: Fair                              Cognition Arousal/Alertness: Awake/alert Behavior During Therapy: WFL for tasks assessed/performed Overall Cognitive Status: Within Functional Limits for tasks assessed                                          Exercises Total Joint Exercises Ankle Circles/Pumps: Strengthening, Both, 20 reps, Supine (gentle resistance) Quad Sets: Strengthening, Both, 10 reps, Supine Gluteal Sets: Strengthening, Both, 10 reps, Supine Heel Slides: Strengthening, Both, 10 reps, Supine Straight Leg Raises: Strengthening, Both, 10 reps Long Arc Quad: Strengthening, Both, 10 reps, Seated Marching in Standing: Strengthening, Both, 10 reps, Seated Bridges: Strengthening, Both, 10 reps, Supine General Exercises - Upper Extremity Shoulder  Flexion: Strengthening, Both, 10 reps Elbow Flexion: Strengthening, Both, 10 reps, Supine Elbow Extension: Strengthening, 10 reps, Both, Supine General Exercises - Lower Extremity Toe Raises: AROM, Both, 10 reps,  Seated Heel Raises: AROM, Both, 10 reps, seated Other Exercises Other Exercises: isometric supine scapular retraction, both, x10    General Comments        Pertinent Vitals/Pain Pain Assessment Pain Assessment: 0-10 Pain Score: 10-Worst pain ever Pain Descriptors / Indicators: Headache Pain Intervention(s): Limited activity within patient's tolerance, RN gave pain meds during session, Monitored during session    Home Living                          Prior Function            PT Goals (current goals can now be found in the care plan section) Acute Rehab PT Goals Patient Stated Goal: " I can to go back to [facility] and get better there again." PT Goal Formulation: With patient Time For Goal Achievement: 04/26/22 Potential to Achieve Goals: Good Progress towards PT goals: Progressing toward goals    Frequency    Min 2X/week      PT Plan Current plan remains appropriate    Co-evaluation              AM-PAC PT "6 Clicks" Mobility   Outcome Measure  Help needed turning from your back to your side while in a flat bed without using bedrails?: A Little Help needed moving from lying on your back to sitting on the side of a flat bed without using bedrails?: A Little   Help needed standing up from a chair using your arms (e.g., wheelchair or bedside chair)?: A Little Help needed to walk in hospital room?: A Little Help needed climbing 3-5 steps with a railing? : A Lot 6 Click Score: 14    End of Session Equipment Utilized During Treatment: Gait belt Activity Tolerance: Patient limited by fatigue;Patient limited by pain;Patient tolerated treatment well Patient left: in bed;with call bell/phone within reach;with bed alarm set Nurse Communication: Mobility status;Patient requests pain meds PT Visit Diagnosis: Unsteadiness on feet (R26.81);Muscle weakness (generalized) (M62.81);History of falling (Z91.81);Dizziness and giddiness (R42);Pain     Time:  1962-2297 PT Time Calculation (min) (ACUTE ONLY): 34 min  Charges:                        Odette Horns MPH, SPT 04/16/22, 5:24 PM

## 2022-04-16 NOTE — TOC Progression Note (Signed)
Transition of Care [facility]) - Progression Note    Patient Details  Name: Kimberly Cook MRN: [id number] Date of Birth: 01/29/1946  Transition of Care [facility]) CM/SW Contact  Gildardo Griffes, [place] Phone Number: 04/16/2022, 1:55 PM  Clinical Narrative:     CSW has updated Gavin Pound at [facility] that patient was denied by insurance for SNF and will return as LTC resident to [facility] when medically cleared.   Pending medical readiness to dc back to [facility].   Expected Discharge Plan: Skilled Nursing Facility Barriers to Discharge: Continued Medical Work up  Expected Discharge Plan and Services Expected Discharge Plan: Skilled Nursing Facility     Post Acute Care Choice: Skilled Nursing Facility Living arrangements for the past 2 months: Skilled Nursing Facility                                       Social Determinants of Health (SDOH) Interventions    Readmission Risk Interventions     No data to display

## 2022-04-16 NOTE — Plan of Care (Signed)
  Problem: Nutrition: Goal: Adequate nutrition will be maintained Outcome: Progressing   Problem: Pain Managment: Goal: General experience of comfort will improve Outcome: Progressing   Problem: Safety: Goal: Ability to remain free from injury will improve Outcome: Progressing   

## 2022-04-16 NOTE — Progress Notes (Addendum)
Progress Note   Patient: Kimberly Cook ZSW:[id number] DOB: 1946/04/30 DOA: 04/11/2022     1 DOS: the patient was seen and examined on 04/16/2022   Brief hospital course: 76 y.o. female with medical history significant for   anxiety, osteoarthritis, chronic HFrEF, stage 3A CKD, CAD, DDD s/p spinal surgery with hardware in place and ongoing lumbar pain with radiculopathy, depression, essential hypertension, GERD, hypothyroidism and dyslipidemia, who presented to the ER on 04/11/2022 after a suspected episode of syncope and a fall in which she sustained a right supraorbital laceration and hematoma.  Laceration was repaired in the ED.  She was reportedly found on the ground, fall was unwitnessed.  Pt reported not been feeling well lately and has been somnolent.  A UA was consistent with UTI the day before presenting, when she was seen by urology and had in office cystoscopy performed.  Urology plans to do cystoscopy in the OR for bladder biopsy in the near future for bladder wall thickening.    ED course -- BP 99/70 with otherwise stable vitals.  Labs mostly unremarkable, K 3.3, Cr 1.13 near baseline. CBC was normal.  Negative Covid-19 PCR.  Troponin was 3.  EKG with NSR 73 bpm, T wave inversions anteroseptal and laterally.  CXR was non-acute.  CT's of face and C-spine with no fractures or traumatic injuries seen other than right front scalp hematoma.  Admitted for further evaluation and management.  9/8: patient with significant orthostatic hypotension and symptomatic.  Started on midodrine.    9/9-11: still hypotensive despite max dose midodrine, scaled back CHF medications.  Fluid challenge in afternoon but orthostatics were not repeated.    9/12: resting BP improved, but still orthostatic. 250cc bolus and repeat orthostatics this afternoon.  Assessment and Plan: * Syncope Due to orthostatic hypotension.   Echo 03/01/22 showed LVEF 45 to 50% with indeterminate diastolic function. Carotid doppler  U/S negative for significant stenosis of ICA's bilaterally.  9/8: positive orthostatic vitals and BP's overall soft at rest 9/9: still low BP's and dizzy when upright 9/10-11: resting BP"s 80's/50's, ongoing orthostatic symptoms 9/12: resting BP improved but still +orthostatics --orthostatic vitals at least daily --continue midodrine 10 mg TID --changes to CHF meds as outlined below --telemetry monitoring        HFrEF (heart failure with reduced ejection fraction) (HCC) Pt was changed from losartan to Entresto around 03/28/22.  This seems to have dramatically impacted her BP. --D/C Entresto --Changed Coreg to low dose Toprol --Hold torsemide for now, reduced dose to 20 mg --Cardiology consulted, appreciate input --Resume low dose losartan 12.5 mg when BP will tolerate --Monitor volume status closely (250 cc bolus today for hypotension / orthostasis) --Daily weights  Recurrent UTI Pt followed by urology closely, just seen day before for in office cysto. Differential diagnosis for her abnormal UA includes CIS and chronic cystitis from colonization.   Started on Unasyn on admission  given her syncope and generalized weakness, as well as altered mental status with somnolence.   Antibiotics were held to monitor clinically.  9/9 - Pt has had persistent hypotension, with baseline normal BP's, also with urinary frequency. Resumed antibiotics  9/11 - stop abx, got total 3 days Urine culture grew pan-sensitive E coli   Hypokalemia K normalized after replacment. Monitor BMP, replace K for goal > 4.0  Acquired scoliosis S/p extensive surgery with hardware in place.   --Follow up with neurosurgery as scheduled    Headache Likely due to her fall, laceration and forehead  hematoma.  Suspect she has concussion. --Repeat head CT on 9/9 was negative --Little relief with Fioricet or Tylenol --Discussed with neurology - suggested trial of 10 mg IV Reglan x 1, and continue analgesia PRN  for 1-2 weeks.  --Reglan ordered --Norco PRN (stopped her PRN tramadol for now)  Hypotension BP's remain low despite midodrine 5 mg TID started yesterday.  Had single dose so far, but BP overnight was 89/45, this AM 101/63 at 0430. --Continue midodrine to 10 mg TID WC --Completed 3 days antibiotics for UTI --Otherwise mgmt as outlined --Maintain MAP>65 --TED hose --250 cc fluid bolus and repeat orthostatic vitals - pending   Peripheral neuropathy --continue Lyrica  Lumbar radiculopathy Hx of extensive spine surgery 1.5 year ago for severe scoliosis, lumbar stenosis, degenerate disc disease.  Has ongoing pain since, in lower back and down both legs, left > right.  Left leg now feels very heavy, per pt. --NO DVT's seen on duplex U/S of BLE's --Neurosurgery updated, ordered xrays to assess stability of hardware --Follow up appt scheduled 05/09/22  --Palliative care consulted, pt likely would benefit to be followed after d/c for symptom management given her ongoing chronic pain and other co-morbidities  CKD Stage IIIa Given gentle IV hydration on admission. Renal function at baseline --Monitor BMP  Anxiety and depression --continue Xanax and Lexapro  Essential hypertension Presented hypotensive and having symptomatic orthostatic hypotension. --Holding Entresto, torsemide --Changed Coreg to lowest dose Toprol for less BP impact --Started on midodrine, on 10 mg TID   GERD (gastroesophageal reflux disease) --continue PPI   Hyperlipidemia --continue statin         Subjective: Pt awake sitting up in recliner.  She is disappointed to hear insurance declined rehab.  She continues to have a headache, it has not improved since admission and since her fall.  Ongoing back and leg pain that limits her a great deal.  Discussed repeating orthostatic vitals today, resting BP's a little better today.   Physical Exam: Vitals:   04/15/22 2045 04/16/22 0514 04/16/22 0909 04/16/22 1547   BP: (!) 104/52 (!) 112/56 102/61 (!) 98/54  Pulse:  (!) 59 61 63  Resp:  18 18 17   Temp:  98.1 F (36.7 C) 97.9 F (36.6 C) (!) 97.5 F (36.4 C)  TempSrc:  Oral Oral   SpO2:  97% 97% 98%  Weight:      Height:       General exam: awake, alert , no acute distress HEENT: Right forehead laceration sutured and not bleeding, improving ecchymosis above lac and periorbitally, moist mucus membranes, hearing grossly normal  Respiratory system: on room air, normal respiratory effort. Cardiovascular system: normal S1/S2,  RRR, no peripheral edema.   Central nervous system: A&O x4. no gross focal neurologic deficits, normal speech Extremities: no edema, normal tone, shins tender on palpation (chronic) Psychiatry: depressed mood, congruent affect, judgement and insight appear normal   Data Reviewed: BMP normal except BUN 31, BNP 159.1  Family Communication: son and daughter-in-law at bedside on 9/10  Disposition: Status is: Inpatient Remains inpatient appropriate because:  Persistent hypotension systolic 80's at rest, medication changes underway, symptomatic orthostasis, getting IV fluids and requires further clinical improvement for safe discharge to prevent syncope and additional injuries.    Planned Discharge Destination:  SNF vs LTC at [facility]    Time spent: 45 minutes including time spent at bedside and in coordination of care  Author: [facility], DO 04/16/2022 3:51 PM  For on call review  [email or web address]

## 2022-04-17 DIAGNOSIS — F32A Depression, unspecified: Secondary | ICD-10-CM

## 2022-04-17 DIAGNOSIS — Z66 Do not resuscitate: Secondary | ICD-10-CM

## 2022-04-17 DIAGNOSIS — F419 Anxiety disorder, unspecified: Secondary | ICD-10-CM

## 2022-04-17 DIAGNOSIS — K219 Gastro-esophageal reflux disease without esophagitis: Secondary | ICD-10-CM

## 2022-04-17 DIAGNOSIS — N1831 Chronic kidney disease, stage 3a: Secondary | ICD-10-CM | POA: Diagnosis not present

## 2022-04-17 DIAGNOSIS — Z515 Encounter for palliative care: Secondary | ICD-10-CM

## 2022-04-17 DIAGNOSIS — L089 Local infection of the skin and subcutaneous tissue, unspecified: Secondary | ICD-10-CM

## 2022-04-17 DIAGNOSIS — M419 Scoliosis, unspecified: Secondary | ICD-10-CM | POA: Diagnosis not present

## 2022-04-17 DIAGNOSIS — R55 Syncope and collapse: Secondary | ICD-10-CM | POA: Diagnosis not present

## 2022-04-17 DIAGNOSIS — S0101XA Laceration without foreign body of scalp, initial encounter: Secondary | ICD-10-CM

## 2022-04-17 LAB — GLUCOSE, CAPILLARY: Glucose-Capillary: 84 mg/dL (ref 70–99)

## 2022-04-17 MED ORDER — DOXYCYCLINE HYCLATE 100 MG PO TABS: 100.0000 mg | ORAL_TABLET | Freq: Two times a day (BID) | ORAL | 0 refills | Status: DC

## 2022-04-17 MED ORDER — DOXYCYCLINE HYCLATE 100 MG PO TABS
100.0000 mg | ORAL_TABLET | Freq: Two times a day (BID) | ORAL | Status: DC
  Administered 2022-04-17: 100 mg via ORAL
  Filled 2022-04-17: qty 1

## 2022-04-17 MED ORDER — MIDODRINE HCL 10 MG PO TABS: 10.0000 mg | ORAL_TABLET | Freq: Three times a day (TID) | ORAL | 1 refills | Status: DC

## 2022-04-17 NOTE — Consult Note (Addendum)
   [facility] Inpatient Consult   04/17/2022  SOLIANA KITKO 1946/05/08 [id number]  [facility] [ACO] Patient: [facility]  [facility] Liaison coverage for [facility], with remote review and calls  Primary Care Provider:  System, Provider Not In [reviewed for [facility] provider]   Patient screened for hospitalization with noted high risk score for unplanned readmission risk and  to assess for potential [place] [facility] service needs for post hospital transition.  Review of patient's medical record of MD progress notes and inpatient TOC notes reveals patient is a long term resident at [facility].   Plan:  Will sign off as patient needs for transition is to be met at a skilled nursing facility level of care with Palliative care noted.    For questions contact:   Natividad Brood, RN BSN [facility] Liaison  [phone] business mobile phone Toll free office [phone]  Fax number: [phone] [email or web address]

## 2022-04-17 NOTE — Discharge Summary (Signed)
Kimberly Cook LZJ:[id number] DOB: 11-Sep-1945 DOA: 04/11/2022  PCP: System, Provider Not In  Admit date: 04/11/2022 Discharge date: 04/17/2022  Time spent: 40 minutes  Recommendations for Outpatient Follow-up:  Close cardiology f/u     Discharge Diagnoses:  Principal Problem:   Syncope Active Problems:   Recurrent UTI   HFrEF (heart failure with reduced ejection fraction) (HCC)   Hypokalemia   Hyperlipidemia   GERD (gastroesophageal reflux disease)   Morbid obesity (HCC)   Essential hypertension   Anxiety and depression   CKD Stage IIIa   Lumbar radiculopathy   History of stroke   Peripheral neuropathy   Hypotension   Headache   Acquired scoliosis   Wound infection   Discharge Condition: stable  Diet recommendation: regular  Filed Weights   04/14/22 0500 04/15/22 0559 04/17/22 0500  Weight: 83 kg 84.8 kg 85 kg    History of present illness:  From admission h and p Kimberly Cook is a 76 y.o. Caucasian female with medical history significant for   anxiety, osteoarthritis, chronic HFrEF, stage 3A CKD, CAD, DDD, depression, essential hypertension, GERD, hypothyroidism and dyslipidemia, who presented to the ER with acute onset of syncope with subsequent fall.  The patient was found on the ground and was seen not to trip before her fall.  She has not been feeling well lately and has been somnolent.  A UA was positive for UTI yesterday.  She subsequently sustained a right periorbital hematoma and a laceration that was repaired in the ER.  No reported chest pain or palpitations.  No reported paresthesias or focal muscle weakness.  No tongue bites or urinary or stool incontinence.  No witnessed seizures.  No reported nausea or vomiting or diarrhea or abdominal pain.   She was just treated with 5 days of p.o. Bactrim before yesterday and underwent flexible cystoscopy by Dr. Apolinar Junes yesterday revealing bladder wall thickening and bladder erythema with differential diagnosis  including CIS versus chronic cystitis from bacterial colonization.  Recommendation was made for consideration of cystoscopy with bladder biopsy in the OR and the plan for preoperative urine culture.  The patient's urine culture from 8/21 revealed E. coli with susceptibility consistent with probable ESBL with resistance to cephalosporins, Levaquin and ampicillin but sensitive to Bactrim, imipenem, ertapenem, Zosyn as well as Unasyn and gentamicin.    Hospital Course:  Syncope Due to orthostatic hypotension.   Echo 03/01/22 showed LVEF 45 to 50% with indeterminate diastolic function. Carotid doppler U/S negative for significant stenosis of ICA's bilaterally. Recent start of entresto likely contributes - midodrine started - liberalize fluids and salt intake - holding home BB, farxiga, and diuretic pending close cardiology f/u, daughter is aware this needs to be scheduled  Scalp laceration Wound infection CT head no fracture. Suture repair in ED on 9/7. Sutures removed on 9/13 and moderate amount of pus exited wound. Remaining pus manually expressed, culture sent. No surrounding cellulitis - 7 days doxy ordered - f/u culture - will need monitoring    HFrEF (heart failure with reduced ejection fraction) (HCC) Pt was changed from losartan to Entresto around 03/28/22.  This seems to have dramatically impacted her BP. --D/C Entresto --holding farxiga, diuretic, and BB as above   Recurrent UTI E coli, treated with unasyn   Acquired scoliosis S/p extensive surgery with hardware in place.   --Follow up with neurosurgery as scheduled      Procedures: Laceration repair   Consultations: none  Discharge Exam: Vitals:   04/17/22 0534 04/17/22  0900  BP: (!) 93/57 106/67  Pulse: (!) 57 65  Resp: 16 16  Temp: 97.7 F (36.5 C) 97.9 F (36.6 C)  SpO2: 96% 98%    General exam: awake, alert , no acute distress HEENT: swelling around right forehead laceration, after sutures removed  moderate amount of pus expressed Respiratory system: on room air, normal respiratory effort. Cardiovascular system: normal S1/S2,  RRR, no peripheral edema.   Central nervous system: A&O x4. no gross focal neurologic deficits, normal speech Extremities: no edema, normal tone,  Psychiatry: depressed mood, congruent affect, judgement and insight appear normal  Discharge Instructions   Discharge Instructions     Change dressing (specify)   Complete by: As directed    Dressing change: once daily, monitor for signs infection   Diet - low sodium heart healthy   Complete by: As directed    Increase activity slowly   Complete by: As directed       Allergies as of 04/17/2022   No Known Allergies      Medication List     STOP taking these medications    carvedilol 3.125 MG tablet Commonly known as: COREG   dapagliflozin propanediol 10 MG Tabs tablet Commonly known as: [place]   sacubitril-valsartan 24-26 MG Commonly known as: ENTRESTO   torsemide 20 MG tablet Commonly known as: DEMADEX       TAKE these medications    acetaminophen 500 MG tablet Commonly known as: TYLENOL Take 1,000 mg by mouth every 8 (eight) hours as needed for moderate pain.   alendronate 70 MG tablet Commonly known as: FOSAMAX Take 70 mg by mouth once a week.   ALPRAZolam 0.5 MG tablet Commonly known as: XANAX Take 1 tablet (0.5 mg total) by mouth 2 (two) times daily as needed for anxiety.   ascorbic acid 500 MG tablet Commonly known as: VITAMIN C Take 500 mg by mouth daily.   aspirin EC 81 MG tablet Take 1 tablet (81 mg total) by mouth at bedtime. Swallow whole.   atorvastatin 20 MG tablet Commonly known as: LIPITOR Take 1 tablet (20 mg total) by mouth daily.   Biotin 1 MG Caps Take 1 mg by mouth daily.   cetirizine 5 MG tablet Commonly known as: ZYRTEC Take 5 mg by mouth daily.   citalopram 20 MG tablet Commonly known as: CELEXA Take 20 mg by mouth daily.   Cranberry 450 MG  Tabs Take 1 tablet by mouth every morning.   cyanocobalamin 1000 MCG tablet Commonly known as: VITAMIN B12 Take 1 tablet (1,000 mcg total) by mouth daily.   doxycycline 100 MG tablet Commonly known as: VIBRA-TABS Take 1 tablet (100 mg total) by mouth every 12 (twelve) hours.   folic acid A999333 MCG tablet Commonly known as: FOLVITE Take 1 tablet (400 mcg total) by mouth daily. SEPARATE ALL SUPPLEMENTS TO LUNCH OR DINNER AND PRILOSEC NOT TO MESS W/THYROID MED   levothyroxine 100 MCG tablet Commonly known as: SYNTHROID Take 1 tablet (100 mcg total) by mouth daily before breakfast.   lidocaine 4 % Place 1 patch onto the skin daily. Remove & Discard patch within 12 hours or as directed by MD   magnesium oxide 400 (240 Mg) MG tablet Commonly known as: MAG-OX Take 1 tablet (400 mg total) by mouth daily.   melatonin 5 MG Tabs Take 1 tablet (5 mg total) by mouth at bedtime. What changed: how much to take   midodrine 10 MG tablet Commonly known as: PROAMATINE Take 1  tablet (10 mg total) by mouth 3 (three) times daily with meals.   montelukast 10 MG tablet Commonly known as: SINGULAIR Take 1 tablet (10 mg total) by mouth at bedtime.   multivitamin with minerals Tabs tablet Take 1 tablet by mouth daily.   omeprazole 40 MG capsule Commonly known as: PRILOSEC Take 1 capsule (40 mg total) by mouth daily. After lunch   polyethylene glycol powder 17 GM/SCOOP powder Commonly known as: GLYCOLAX/MIRALAX Take 17 g by mouth daily as needed for moderate constipation.   potassium chloride SA 20 MEQ tablet Commonly known as: KLOR-CON M Take 1 tablet (20 mEq total) by mouth 2 (two) times daily.   pregabalin 75 MG capsule Commonly known as: LYRICA Take 75 mg by mouth 3 (three) times daily.   Refresh Liquigel 1 % Gel Generic drug: Carboxymethylcellulose Sodium Place 1 drop into both eyes at bedtime.   traMADol 50 MG tablet Commonly known as: ULTRAM Take 1 tablet (50 mg total) by  mouth every 6 (six) hours as needed for moderate pain.   traZODone 100 MG tablet Commonly known as: DESYREL Take 100 mg by mouth at bedtime.   Vitamin D3 125 MCG (5000 UT) Tabs Take 1 tablet (5,000 Units total) by mouth daily.               Discharge Care Instructions  (From admission, onward)           Start     Ordered   04/17/22 0000  Change dressing (specify)       Comments: Dressing change: once daily, monitor for signs infection   04/17/22 0931           No Known Allergies  Follow-up Information     End, Cristal Deer, MD Follow up.   Specialty: Cardiology Contact information: [address] [place] [address] [phone]                  The results of significant diagnostics from this hospitalization (including imaging, microbiology, ancillary and laboratory) are listed below for reference.    Significant Diagnostic Studies: US Venous Img Lower Bilateral (DVT)  Result Date: 04/14/2022 CLINICAL DATA:  Pain EXAM: BILATERAL LOWER EXTREMITY VENOUS DOPPLER ULTRASOUND TECHNIQUE: Gray-scale sonography with graded compression, as well as color Doppler and duplex ultrasound were performed to evaluate the lower extremity deep venous systems from the level of the common femoral vein and including the common femoral, femoral, profunda femoral, popliteal and calf veins including the posterior tibial, peroneal and gastrocnemius veins when visible. The superficial great saphenous vein was also interrogated. Spectral Doppler was utilized to evaluate flow at rest and with distal augmentation maneuvers in the common femoral, femoral and popliteal veins. COMPARISON:  Venous Doppler examination of left lower extremity done on 01/12/2022 FINDINGS: RIGHT LOWER EXTREMITY Common Femoral Vein: No evidence of thrombus. Normal compressibility, respiratory phasicity and response to augmentation. Saphenofemoral Junction: No evidence of thrombus. Normal  compressibility and flow on color Doppler imaging. Profunda Femoral Vein: No evidence of thrombus. Normal compressibility and flow on color Doppler imaging. Femoral Vein: No evidence of thrombus. Normal compressibility, respiratory phasicity and response to augmentation. Popliteal Vein: No evidence of thrombus. Normal compressibility, respiratory phasicity and response to augmentation. Calf Veins: No evidence of thrombus. Normal compressibility and flow on color Doppler imaging. Superficial Great Saphenous Vein: No evidence of thrombus. Normal compressibility. Venous Reflux:  None. Other Findings:  None. LEFT LOWER EXTREMITY Common Femoral Vein: No evidence of thrombus. Normal compressibility,  respiratory phasicity and response to augmentation. Saphenofemoral Junction: No evidence of thrombus. Normal compressibility and flow on color Doppler imaging. Profunda Femoral Vein: No evidence of thrombus. Normal compressibility and flow on color Doppler imaging. Femoral Vein: No evidence of thrombus. Normal compressibility, respiratory phasicity and response to augmentation. Popliteal Vein: No evidence of thrombus. Normal compressibility, respiratory phasicity and response to augmentation. Calf Veins: No evidence of thrombus. Normal compressibility and flow on color Doppler imaging. Superficial Great Saphenous Vein: No evidence of thrombus. Normal compressibility. Venous Reflux:  None. Other Findings:  None. IMPRESSION: No evidence of deep venous thrombosis in either lower extremity. Electronically Signed   By: Elmer Picker M.D.   On: 04/14/2022 15:44   DG SCOLIOSIS EVAL COMPLETE SPINE 2 OR 3 VIEWS  Result Date: 04/14/2022 CLINICAL DATA:  Continued pain post thoracolumbar fusion. EXAM: DG SCOLIOSIS EVAL COMPLETE SPINE 2-3V COMPARISON:  10/12/2020 FINDINGS: Spinal stabilization hardware intact from T4 to the iliac bones. Interval improvement in previously seen biphasic curvature of the thoracolumbar spine.  Interbody fusion from the L1-2 level to the L5-S1 level. Vertebral body heights are within normal. Angle of curvature convex right of the thoracic spine from superior endplate T8-T8 inferior endplate of L1 is 18 degrees (previously 33 degrees). Angle of curvature of the lumbar spine convex left from superior endplate of L1 to inferior endplate of L4 is 18 degrees (previously 20 degrees). Remainder of the exam is unchanged. IMPRESSION: No acute findings. Interval improvement in biphasic curvature of the thoracolumbar spine post fixation/fusion from T4 to the pelvis with stabilization hardware intact. The body fusion from L1-2 to L5-S1. Electronically Signed   By: Marin Olp M.D.   On: 04/14/2022 15:43   CT HEAD WO CONTRAST (5MM)  Result Date: 04/13/2022 CLINICAL DATA:  Head trauma. Persistent and progressive headaches. Right frontal scalp contusion. EXAM: CT HEAD WITHOUT CONTRAST TECHNIQUE: Contiguous axial images were obtained from the base of the skull through the vertex without intravenous contrast. RADIATION DOSE REDUCTION: This exam was performed according to the departmental dose-optimization program which includes automated exposure control, adjustment of the mA and/or kV according to patient size and/or use of iterative reconstruction technique. COMPARISON:  CT of the head and maxillofacial 04/11/2022 FINDINGS: Brain: Mild generalized atrophy and white matter disease is stable. No acute infarct, hemorrhage, or mass lesion is present. The ventricles are proportionate to the degree of atrophy. No significant extraaxial fluid collection is present. The brainstem and cerebellum are within normal limits. Vascular: Faint atherosclerotic calcifications are present within the cavernous internal carotid arteries. No hyperdense vessel is present. Skull: The right supraorbital scalp hematoma is slightly decreased in size. 5 mm radiopaque density within the hematoma is again noted, concerning for a foreign body. No  other focal soft tissue injuries. No underlying fracture is present. Sinuses/Orbits: The paranasal sinuses and mastoid air cells are clear. The globes and orbits are within normal limits. IMPRESSION: 1. Slight decrease in size of right supraorbital scalp hematoma. 2. 5 mm radiopaque density within the hematoma is again noted, concerning for a foreign body. 3. Stable mild atrophy and white matter disease. This likely reflects the sequela of chronic microvascular ischemia. 4. No acute intracranial abnormality or significant interval change to explain the patient's headaches. Electronically Signed   By: San Morelle M.D.   On: 04/13/2022 13:06   US Carotid Bilateral  Result Date: 04/12/2022 CLINICAL DATA:  Syncope Hypertension Visual disturbance EXAM: BILATERAL CAROTID DUPLEX ULTRASOUND TECHNIQUE: Pearline Cables scale imaging, color Doppler and duplex ultrasound  were performed of bilateral carotid and vertebral arteries in the neck. COMPARISON:  None available FINDINGS: Criteria: Quantification of carotid stenosis is based on velocity parameters that correlate the residual internal carotid diameter with NASCET-based stenosis levels, using the diameter of the distal internal carotid lumen as the denominator for stenosis measurement. The following velocity measurements were obtained: RIGHT ICA: 88/28 cm/sec CCA: [id number] cm/sec SYSTOLIC ICA/CCA RATIO:  0.9 ECA: 110 cm/sec LEFT ICA: 74/21 cm/sec CCA: 0000000 cm/sec SYSTOLIC ICA/CCA RATIO:  0.7 ECA: 70 cm/sec RIGHT CAROTID ARTERY: No significant atheromatous plaque. RIGHT VERTEBRAL ARTERY:  Antegrade flow. LEFT CAROTID ARTERY:  No significant atheromatous plaque. LEFT VERTEBRAL ARTERY:  Antegrade flow. IMPRESSION: No significant stenosis of internal carotid arteries. Electronically Signed   By: Miachel Roux M.D.   On: 04/12/2022 10:33   CT HEAD WO CONTRAST (5MM)  Addendum Date: 04/11/2022   ADDENDUM REPORT: 04/11/2022 19:59 ADDENDUM: Silver nitrate used on bleeding right  scalp laceration patient prior to CT-visualized hyperdensity within the soft tissue defect likely correlates with silver nitrate usage. Correlate with physical exam. These results were called by telephone at the time of interpretation on 04/11/2022 at 7:58 pm to provider [facility] , who verbally acknowledged these results. Electronically Signed   By: Iven Finn M.D.   On: 04/11/2022 19:59   Result Date: 04/11/2022 CLINICAL DATA:  Head trauma, minor (Age >= 65y); Neck trauma (Age >= 65y); Facial trauma, blunt EXAM: CT HEAD WITHOUT CONTRAST CT MAXILLOFACIAL WITHOUT CONTRAST CT CERVICAL SPINE WITHOUT CONTRAST TECHNIQUE: Multidetector CT imaging of the head, cervical spine, and maxillofacial structures were performed using the standard protocol without intravenous contrast. Multiplanar CT image reconstructions of the cervical spine and maxillofacial structures were also generated. RADIATION DOSE REDUCTION: This exam was performed according to the departmental dose-optimization program which includes automated exposure control, adjustment of the mA and/or kV according to patient size and/or use of iterative reconstruction technique. COMPARISON:  None Available. FINDINGS: CT HEAD FINDINGS BRAIN: BRAIN Cerebral ventricle sizes are concordant with the degree of cerebral volume loss. Patchy and confluent areas of decreased attenuation are noted throughout the deep and periventricular white matter of the cerebral hemispheres bilaterally, compatible with chronic microvascular ischemic disease. No evidence of large-territorial acute infarction. No parenchymal hemorrhage. No mass lesion. No extra-axial collection. No mass effect or midline shift. No hydrocephalus. Basilar cisterns are patent. Vascular: No hyperdense vessel. Skull: No acute fracture or focal lesion. Other: Right frontal scalp 1 cm hematoma formation. Associated slight hyperdensity measuring 6 x 3 mm that could represent a retained radiopaque foreign body. 2  mm metallic density overlying the soft tissues of the right base (3:5). No definite retained metallic foreign body within the soft tissues. CT MAXILLOFACIAL FINDINGS Osseous: No fracture or mandibular dislocation. No destructive process. Temporomandibular joint degenerative changes. Patient is edentulous. Sinuses/Orbits: Paranasal sinuses and mastoid air cells are clear. The orbits are unremarkable. Soft tissues: Negative. CT CERVICAL SPINE FINDINGS Alignment: Normal. Skull base and vertebrae: Partially visualized thoracic surgical hardware. Multilevel moderate degenerative changes of the spine with associated moderate osseous neural foraminal stenosis. No severe osseous central canal stenosis or neural foraminal stenosis. No acute fracture. No aggressive appearing focal osseous lesion or focal pathologic process. Soft tissues and spinal canal: No prevertebral fluid or swelling. No visible canal hematoma. Upper chest: Unremarkable. Other: None. IMPRESSION: 1. No acute intracranial abnormality. 2. No acute displaced facial fracture. 3. No acute displaced fracture or traumatic listhesis of the cervical spine. 4. Right frontal scalp 1 cm  hematoma formation. Associated slight hyperdensity measuring 6 x 3 mm that could represent a retained radiopaque foreign body. Electronically Signed: By: Iven Finn M.D. On: 04/11/2022 19:08   CT Cervical Spine Wo Contrast  Addendum Date: 04/11/2022   ADDENDUM REPORT: 04/11/2022 19:59 ADDENDUM: Silver nitrate used on bleeding right scalp laceration patient prior to CT-visualized hyperdensity within the soft tissue defect likely correlates with silver nitrate usage. Correlate with physical exam. These results were called by telephone at the time of interpretation on 04/11/2022 at 7:58 pm to provider [facility] , who verbally acknowledged these results. Electronically Signed   By: Iven Finn M.D.   On: 04/11/2022 19:59   Result Date: 04/11/2022 CLINICAL DATA:  Head trauma,  minor (Age >= 65y); Neck trauma (Age >= 65y); Facial trauma, blunt EXAM: CT HEAD WITHOUT CONTRAST CT MAXILLOFACIAL WITHOUT CONTRAST CT CERVICAL SPINE WITHOUT CONTRAST TECHNIQUE: Multidetector CT imaging of the head, cervical spine, and maxillofacial structures were performed using the standard protocol without intravenous contrast. Multiplanar CT image reconstructions of the cervical spine and maxillofacial structures were also generated. RADIATION DOSE REDUCTION: This exam was performed according to the departmental dose-optimization program which includes automated exposure control, adjustment of the mA and/or kV according to patient size and/or use of iterative reconstruction technique. COMPARISON:  None Available. FINDINGS: CT HEAD FINDINGS BRAIN: BRAIN Cerebral ventricle sizes are concordant with the degree of cerebral volume loss. Patchy and confluent areas of decreased attenuation are noted throughout the deep and periventricular white matter of the cerebral hemispheres bilaterally, compatible with chronic microvascular ischemic disease. No evidence of large-territorial acute infarction. No parenchymal hemorrhage. No mass lesion. No extra-axial collection. No mass effect or midline shift. No hydrocephalus. Basilar cisterns are patent. Vascular: No hyperdense vessel. Skull: No acute fracture or focal lesion. Other: Right frontal scalp 1 cm hematoma formation. Associated slight hyperdensity measuring 6 x 3 mm that could represent a retained radiopaque foreign body. 2 mm metallic density overlying the soft tissues of the right base (3:5). No definite retained metallic foreign body within the soft tissues. CT MAXILLOFACIAL FINDINGS Osseous: No fracture or mandibular dislocation. No destructive process. Temporomandibular joint degenerative changes. Patient is edentulous. Sinuses/Orbits: Paranasal sinuses and mastoid air cells are clear. The orbits are unremarkable. Soft tissues: Negative. CT CERVICAL SPINE  FINDINGS Alignment: Normal. Skull base and vertebrae: Partially visualized thoracic surgical hardware. Multilevel moderate degenerative changes of the spine with associated moderate osseous neural foraminal stenosis. No severe osseous central canal stenosis or neural foraminal stenosis. No acute fracture. No aggressive appearing focal osseous lesion or focal pathologic process. Soft tissues and spinal canal: No prevertebral fluid or swelling. No visible canal hematoma. Upper chest: Unremarkable. Other: None. IMPRESSION: 1. No acute intracranial abnormality. 2. No acute displaced facial fracture. 3. No acute displaced fracture or traumatic listhesis of the cervical spine. 4. Right frontal scalp 1 cm hematoma formation. Associated slight hyperdensity measuring 6 x 3 mm that could represent a retained radiopaque foreign body. Electronically Signed: By: Iven Finn M.D. On: 04/11/2022 19:08   CT Maxillofacial Wo Contrast  Addendum Date: 04/11/2022   ADDENDUM REPORT: 04/11/2022 19:59 ADDENDUM: Silver nitrate used on bleeding right scalp laceration patient prior to CT-visualized hyperdensity within the soft tissue defect likely correlates with silver nitrate usage. Correlate with physical exam. These results were called by telephone at the time of interpretation on 04/11/2022 at 7:58 pm to provider [facility] , who verbally acknowledged these results. Electronically Signed   By: Clelia Croft.D.  On: 04/11/2022 19:59   Result Date: 04/11/2022 CLINICAL DATA:  Head trauma, minor (Age >= 65y); Neck trauma (Age >= 65y); Facial trauma, blunt EXAM: CT HEAD WITHOUT CONTRAST CT MAXILLOFACIAL WITHOUT CONTRAST CT CERVICAL SPINE WITHOUT CONTRAST TECHNIQUE: Multidetector CT imaging of the head, cervical spine, and maxillofacial structures were performed using the standard protocol without intravenous contrast. Multiplanar CT image reconstructions of the cervical spine and maxillofacial structures were also generated.  RADIATION DOSE REDUCTION: This exam was performed according to the departmental dose-optimization program which includes automated exposure control, adjustment of the mA and/or kV according to patient size and/or use of iterative reconstruction technique. COMPARISON:  None Available. FINDINGS: CT HEAD FINDINGS BRAIN: BRAIN Cerebral ventricle sizes are concordant with the degree of cerebral volume loss. Patchy and confluent areas of decreased attenuation are noted throughout the deep and periventricular white matter of the cerebral hemispheres bilaterally, compatible with chronic microvascular ischemic disease. No evidence of large-territorial acute infarction. No parenchymal hemorrhage. No mass lesion. No extra-axial collection. No mass effect or midline shift. No hydrocephalus. Basilar cisterns are patent. Vascular: No hyperdense vessel. Skull: No acute fracture or focal lesion. Other: Right frontal scalp 1 cm hematoma formation. Associated slight hyperdensity measuring 6 x 3 mm that could represent a retained radiopaque foreign body. 2 mm metallic density overlying the soft tissues of the right base (3:5). No definite retained metallic foreign body within the soft tissues. CT MAXILLOFACIAL FINDINGS Osseous: No fracture or mandibular dislocation. No destructive process. Temporomandibular joint degenerative changes. Patient is edentulous. Sinuses/Orbits: Paranasal sinuses and mastoid air cells are clear. The orbits are unremarkable. Soft tissues: Negative. CT CERVICAL SPINE FINDINGS Alignment: Normal. Skull base and vertebrae: Partially visualized thoracic surgical hardware. Multilevel moderate degenerative changes of the spine with associated moderate osseous neural foraminal stenosis. No severe osseous central canal stenosis or neural foraminal stenosis. No acute fracture. No aggressive appearing focal osseous lesion or focal pathologic process. Soft tissues and spinal canal: No prevertebral fluid or swelling. No  visible canal hematoma. Upper chest: Unremarkable. Other: None. IMPRESSION: 1. No acute intracranial abnormality. 2. No acute displaced facial fracture. 3. No acute displaced fracture or traumatic listhesis of the cervical spine. 4. Right frontal scalp 1 cm hematoma formation. Associated slight hyperdensity measuring 6 x 3 mm that could represent a retained radiopaque foreign body. Electronically Signed: By: Iven Finn M.D. On: 04/11/2022 19:08   DG Chest Portable 1 View  Result Date: 04/11/2022 CLINICAL DATA:  Fall EXAM: PORTABLE CHEST 1 VIEW COMPARISON:  01/11/2022 FINDINGS: The heart size and mediastinal contours are within normal limits. Both lungs are clear. Posterior spinal hardware. IMPRESSION: No active disease. Electronically Signed   By: Donavan Foil M.D.   On: 04/11/2022 18:40    Microbiology: Recent Results (from the past 240 hour(s))  Microscopic Examination     Status: Abnormal   Collection Time: 04/10/22  2:01 PM   Urine  Result Value Ref Range Status   WBC, UA >30 (A) 0 - 5 /hpf Final   RBC, Urine 3-10 (A) 0 - 2 /hpf Final   Epithelial Cells (non renal) 0-10 0 - 10 /hpf Final   Bacteria, UA Many (A) None seen/Few Final   Yeast, UA Present (A) None seen Final  SARS Coronavirus 2 by RT PCR (hospital order, performed in [facility] hospital lab) *cepheid single result test* Anterior Nasal Swab     Status: None   Collection Time: 04/11/22  6:51 PM   Specimen: Anterior Nasal Swab  Result  Value Ref Range Status   SARS Coronavirus 2 by RT PCR NEGATIVE NEGATIVE Final    Comment: (NOTE) SARS-CoV-2 target nucleic acids are NOT DETECTED.  The SARS-CoV-2 RNA is generally detectable in upper and lower respiratory specimens during the acute phase of infection. The lowest concentration of SARS-CoV-2 viral copies this assay can detect is 250 copies / mL. A negative result does not preclude SARS-CoV-2 infection and should not be used as the sole basis for treatment or other patient  management decisions.  A negative result may occur with improper specimen collection / handling, submission of specimen other than nasopharyngeal swab, presence of viral mutation(s) within the areas targeted by this assay, and inadequate number of viral copies (<250 copies / mL). A negative result must be combined with clinical observations, patient history, and epidemiological information.  Fact Sheet for Patients:   https://www.patel.info/  Fact Sheet for Healthcare Providers: https://hall.com/  This test is not yet approved or  cleared by the [place] FDA and has been authorized for detection and/or diagnosis of SARS-CoV-2 by FDA under an Emergency Use Authorization (EUA).  This EUA will remain in effect (meaning this test can be used) for the duration of the COVID-19 declaration under Section 564(b)(1) of the Act, 21 U.S.C. section 360bbb-3(b)(1), unless the authorization is terminated or revoked sooner.  Performed at [facility], [address] [place], [place] [address]   Urine Culture     Status: Abnormal   Collection Time: 04/11/22  6:51 PM   Specimen: Urine, Random  Result Value Ref Range Status   Specimen Description   Final    URINE, RANDOM Performed at [facility], [address] [place], [place] [address]    Special Requests   Final    NONE Performed at [facility], [address] [place], [place] [address]    Culture >=100,000 COLONIES/mL ESCHERICHIA COLI (A)  Final   Report Status 04/14/2022 FINAL  Final   Organism ID, Bacteria ESCHERICHIA COLI (A)  Final      Susceptibility   Escherichia coli - MIC*    AMPICILLIN <=2 SENSITIVE Sensitive     CEFAZOLIN <=4 SENSITIVE Sensitive     CEFEPIME <=0.12 SENSITIVE Sensitive     CEFTRIAXONE <=0.25 SENSITIVE Sensitive     CIPROFLOXACIN <=0.25 SENSITIVE Sensitive     GENTAMICIN <=1 SENSITIVE Sensitive     IMIPENEM <=0.25 SENSITIVE  Sensitive     NITROFURANTOIN <=16 SENSITIVE Sensitive     TRIMETH/SULFA <=20 SENSITIVE Sensitive     AMPICILLIN/SULBACTAM <=2 SENSITIVE Sensitive     PIP/TAZO <=4 SENSITIVE Sensitive     * >=100,000 COLONIES/mL ESCHERICHIA COLI     Labs: Basic Metabolic Panel: Recent Labs  Lab 04/11/22 1851 04/12/22 0522 04/13/22 0606 04/14/22 0609 04/16/22 0554  NA 139 139 136 138 140  K 3.3* 3.3* 3.7 3.8 4.5  CL 102 102 101 101 107  CO2 27 28 26 30 27   GLUCOSE 111* 122* 90 91 88  BUN 23 25* 22 28* 31*  CREATININE 1.13* 1.25* 1.22* 1.14* 0.92  CALCIUM 8.7* 8.4* 8.7* 8.9 9.1  MG  --  2.2  --   --   --    Liver Function Tests: Recent Labs  Lab 04/11/22 1851  AST 23  ALT 16  ALKPHOS 74  BILITOT 0.7  PROT 7.0  ALBUMIN 3.9   Recent Labs  Lab 04/11/22 1851  LIPASE 37   No results for input(s): "AMMONIA" in the last 168 hours.  CBC: Recent Labs  Lab 04/11/22 1851 04/12/22 0522 04/13/22 0606 04/14/22 0609  WBC 5.1 5.2 3.8* 4.6  NEUTROABS 3.8  --   --  3.0  HGB 12.9 11.8* 11.4* 10.5*  HCT 38.3 34.6* 33.7* 31.6*  MCV 93.2 93.8 92.6 93.2  PLT 194 154 145* 151   Cardiac Enzymes: No results for input(s): "CKTOTAL", "CKMB", "CKMBINDEX", "TROPONINI" in the last 168 hours. BNP: BNP (last 3 results) Recent Labs    08/25/21 1913 01/12/22 0223 04/16/22 0554  BNP 353.9* 21.1 159.1*    ProBNP (last 3 results) No results for input(s): "PROBNP" in the last 8760 hours.  CBG: Recent Labs  Lab 04/13/22 0530 04/14/22 0540 04/14/22 1647 04/15/22 0620 04/17/22 0620  GLUCAP 92 94 125* 120* 84       Signed:  Desma Maxim MD.  [facility] 04/17/2022, 9:31 AM

## 2022-04-17 NOTE — Consult Note (Signed)
Consultation Note Date: 04/17/2022   Patient Name: Kimberly Cook  DOB: Mar 03, 1946  MRN: [id number]  Age / Sex: 76 y.o., female  PCP: System, Provider Not In Referring Physician: Gwynne Edinger, MD  Reason for Consultation: Establishing goals of care  HPI/Patient Profile: 76 y.o. female  with past medical history of CKD (stage IIIa), chronic HF R EF, OA, depression/anxiety, HTN, GERD, hypothyroidism, DDD, CAD, and dyslipidemia admitted on 04/11/2022 with syncope with subsequent fall at [facility] facility.  On admission, patient was found to have right periorbital hematoma and laceration sustained from fall (repaired in ER) and was positive for UTI as well.  Clinical Assessment and Goals of Care: I have reviewed medical records including EPIC notes, labs and imaging, assessed the patient and then met with patient at bedside to discuss diagnosis prognosis, GOC, EOL wishes, disposition and options.  I introduced Palliative Medicine as specialized medical care for people living with serious illness. It focuses on providing relief from the symptoms and stress of a serious illness. The goal is to improve quality of life for both the patient and the family.  We discussed a brief life review of the patient. 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 She is a widow. She enjoys spending time with her two sons and their family as well as visiting friends at her former independent living facility [facility]).   As far as functional and nutritional status patient endorses healthy appetite with no intake issues. She also report fear of falling and does not like to get OOB.  We discussed patient's current illness and what it means in the larger context of patient's on-going co-morbidities. I attempted to elicit values and goals of care important to the patient.  She shares spending time with her family is important. She knows she is not dying but that her body is limiting things she can do.   Pain assessment completed. She has chronic back pain since her surgeries (approximately 1.5 years ago) for which she shares up until recently was well managed with her daily meds. She endorses recent shooting pains that have resolved. Her largest source of pain is in her right periorbital area where she has a hematoma. Also, attending recently evaluated it and took a swab to r/o infection, and she shares that this area is more tender after this evaluation.   Pt endorsed use of hydrocodone overnight helped ease her pain to a 3/10 and she was able to sleep. I ecnouraged continued use of hydrocodone as needed for pain. I also discussed use of Celebrex (137m PO BID) to address inflammation and susequent pain in trauma area around her right eye.    Discussed with patient the importance of continued conversation with family and the medical providers regarding overall plan of care and treatment options, ensuring decisions are within the context of the patient's values and GOCs.    Palliative Care services outpatient were explained and offered. TOC referral placed.  Primary Decision Maker PATIENT  Code Status/Advance Care Planning: DNR  Discharge Planning: LTC with outpt palliative  Primary Diagnoses: Present on Admission:  Syncope  Recurrent UTI  Essential hypertension  Hyperlipidemia  Anxiety and depression  GERD (gastroesophageal reflux disease)  CKD Stage IIIa  Hypotension  Headache  Lumbar radiculopathy  Acquired scoliosis  Morbid obesity ([place])   Physical Exam Vitals reviewed.  Constitutional:      Appearance: She is obese.  HENT:     Head:     Comments: Laceration on right periorbital - mild erythema, not warm to touch, scant serosanguinous drianage    Mouth/Throat:     Mouth: Mucous membranes are moist.  Eyes:     Pupils: Pupils are equal,  round, and reactive to light.  Cardiovascular:     Rate and Rhythm: Normal rate.     Pulses: Normal pulses.  Pulmonary:     Effort: Pulmonary effort is normal.  Musculoskeletal:     Comments: LE weakness  Skin:    General: Skin is warm.     Findings: No bruising.  Neurological:     Mental Status: She is alert and oriented to person, place, and time.  Psychiatric:        Mood and Affect: Mood normal.        Behavior: Behavior normal.        Thought Content: Thought content normal.        Judgment: Judgment normal.     Palliative Assessment/Data: 50%     Thank you for this consult. Palliative medicine will continue to follow and assist holistically.   Time Total: 75 minutes Greater than 50%  of this time was spent counseling and coordinating care related to the above assessment and plan.  Signed by: Jordan Hawks, DNP, FNP-BC Palliative Medicine    Please contact Palliative Medicine Team phone at [phone] for questions and concerns.  For individual provider: See Shea Evans

## 2022-04-17 NOTE — Progress Notes (Signed)
[facility] Hospital Liaison Note  Notified by TOC/Keona of patient/family request of [facility] Paliative services.  [facility] hospital liaison will follow patient for discharge disposition.   Please call with any questions/concerns.    Thank you for the opportunity to participate in this patient's care.   Shania Daniel, MSW [facility] Hospital Liaison  [phone]  

## 2022-04-17 NOTE — Progress Notes (Signed)
[facility] Posting Form   Surgery Date/Time: Date: 05/06/2022  Surgeon: Dr. Vanna Scotland, MD  Surgery Location: Day Surgery  Inpt ( No  )   Outpt (Yes)   Obs ( No  )   Diagnosis: N32.81 Bladder Erythema  -CPT: 45848, [phone]  Surgery: Cystoscopy with Bladder Biopsy and Bilateral Retrograde Pyelograms  Stop Anticoagulations: Yes and also hold ASA  Cardiac/Medical/Pulmonary Clearance needed: no  *Orders entered into EPIC  Date: 04/17/22   *Case booked in EPIC  Date: 04/10/2022  *Notified pt of Surgery: Date: 04/10/2022  PRE-OP UA & CX: yes, obtained on 04/11/2022  *Placed into Prior Authorization Work Angela Nevin Date: 04/17/22   Assistant/laser/rep:No

## 2022-04-17 NOTE — Plan of Care (Signed)
  Problem: Nutrition: Goal: Adequate nutrition will be maintained Outcome: Progressing   Problem: Elimination: Goal: Will not experience complications related to urinary retention Outcome: Progressing   Problem: Safety: Goal: Ability to remain free from injury will improve Outcome: Progressing   

## 2022-04-17 NOTE — TOC Transition Note (Addendum)
Transition of Care [facility]) - CM/SW Discharge Note   Patient Details  Name: Kimberly Cook MRN: [id number] Date of Birth: [birth year]  Transition of Care [facility] CM/SW Contact:  Truddie Hidden, RN Phone Number: 04/17/2022, 12:21 PM   Clinical Narrative:   Patient discharging to [facility]. Admissions director, Anette Riedel approved admission for today. Patient assigned to room 216 A. Report is to be called to [phone], ask for B wing. MD and nurse notified. Face sheet, and medical necessity sheets added to EMS packet. EMS arranged. Patient and Dawn Hutchens notified.    Palliative consult requested. Patient does not have a preference. Referral sent to Odette Fraction from [facility].TOC signing off.      Barriers to Discharge: Continued Medical Work up   Patient Goals and CMS Choice Patient states their goals for this hospitalization and ongoing recovery are:: SNF for rehab CMS Medicare.gov Compare Post Acute Care list provided to:: Patient Choice offered to / list presented to : Patient  Discharge Placement                       Discharge Plan and Services     Post Acute Care Choice: Skilled Nursing Facility                               Social Determinants of Health (SDOH) Interventions     Readmission Risk Interventions     No data to display

## 2022-04-17 NOTE — Progress Notes (Signed)
Report called to [facility], Charity fundraiser, at [facility].  Awaiting transport.

## 2022-04-18 DIAGNOSIS — E039 Hypothyroidism, unspecified: Secondary | ICD-10-CM | POA: Diagnosis not present

## 2022-04-18 DIAGNOSIS — M810 Age-related osteoporosis without current pathological fracture: Secondary | ICD-10-CM | POA: Diagnosis not present

## 2022-04-18 DIAGNOSIS — E038 Other specified hypothyroidism: Secondary | ICD-10-CM | POA: Diagnosis not present

## 2022-04-18 DIAGNOSIS — K219 Gastro-esophageal reflux disease without esophagitis: Secondary | ICD-10-CM | POA: Diagnosis not present

## 2022-04-18 DIAGNOSIS — R55 Syncope and collapse: Secondary | ICD-10-CM | POA: Diagnosis not present

## 2022-04-18 DIAGNOSIS — E7849 Other hyperlipidemia: Secondary | ICD-10-CM | POA: Diagnosis not present

## 2022-04-18 DIAGNOSIS — I951 Orthostatic hypotension: Secondary | ICD-10-CM | POA: Diagnosis not present

## 2022-04-18 DIAGNOSIS — G4700 Insomnia, unspecified: Secondary | ICD-10-CM | POA: Diagnosis not present

## 2022-04-18 DIAGNOSIS — F419 Anxiety disorder, unspecified: Secondary | ICD-10-CM | POA: Diagnosis not present

## 2022-04-18 DIAGNOSIS — E785 Hyperlipidemia, unspecified: Secondary | ICD-10-CM | POA: Diagnosis not present

## 2022-04-19 ENCOUNTER — Ambulatory Visit: Payer: Medicare HMO | Admitting: Physician Assistant

## 2022-04-19 DIAGNOSIS — E039 Hypothyroidism, unspecified: Secondary | ICD-10-CM | POA: Diagnosis not present

## 2022-04-19 DIAGNOSIS — E539 Vitamin B deficiency, unspecified: Secondary | ICD-10-CM | POA: Diagnosis not present

## 2022-04-19 DIAGNOSIS — I10 Essential (primary) hypertension: Secondary | ICD-10-CM | POA: Diagnosis not present

## 2022-04-19 DIAGNOSIS — G4700 Insomnia, unspecified: Secondary | ICD-10-CM | POA: Diagnosis not present

## 2022-04-19 DIAGNOSIS — F32A Depression, unspecified: Secondary | ICD-10-CM | POA: Diagnosis not present

## 2022-04-19 DIAGNOSIS — K219 Gastro-esophageal reflux disease without esophagitis: Secondary | ICD-10-CM | POA: Diagnosis not present

## 2022-04-19 DIAGNOSIS — K5900 Constipation, unspecified: Secondary | ICD-10-CM | POA: Diagnosis not present

## 2022-04-19 DIAGNOSIS — I509 Heart failure, unspecified: Secondary | ICD-10-CM | POA: Diagnosis not present

## 2022-04-20 LAB — AEROBIC CULTURE W GRAM STAIN (SUPERFICIAL SPECIMEN)
Culture: NO GROWTH
Gram Stain: NONE SEEN

## 2022-04-23 DIAGNOSIS — L299 Pruritus, unspecified: Secondary | ICD-10-CM | POA: Diagnosis not present

## 2022-04-23 DIAGNOSIS — G8929 Other chronic pain: Secondary | ICD-10-CM | POA: Diagnosis not present

## 2022-04-25 DIAGNOSIS — L299 Pruritus, unspecified: Secondary | ICD-10-CM | POA: Diagnosis not present

## 2022-04-29 ENCOUNTER — Encounter
Admit: 2022-04-29 | Discharge: 2022-04-29 | Disposition: A | Discharge status: Home / Self Care | Payer: Medicare HMO | Attending: Urology | Admitting: Urology

## 2022-04-29 HISTORY — DX: Muscle weakness (generalized): M62.81

## 2022-04-29 HISTORY — DX: Acute respiratory failure, unspecified whether with hypoxia or hypercapnia: J96.00

## 2022-04-29 HISTORY — DX: Vitamin B deficiency, unspecified: E53.9

## 2022-04-29 HISTORY — DX: Other pancytopenia: D61.818

## 2022-04-29 HISTORY — DX: Polyneuropathy, unspecified: G62.9

## 2022-04-29 NOTE — Pre-Procedure Instructions (Signed)
Pre admission appointment completed with patient, she is alert and oriented, she resides at [facility] for rehab currently, this Probation officer spoke to [employer] and she will fax over a recent copy of patients medication record, Instructions for patients procedure will bw faxed to [facility] as soon as I reconcile her medications.

## 2022-04-29 NOTE — Patient Instructions (Addendum)
Your procedure is scheduled on: 05/06/22 - Monday Report to the Registration Desk on the 1st floor of the [facility]. To find out your arrival time, please call [phone] between 1PM - 3PM on: 05/03/22 - Friday If your arrival time is 6:00 am, do not arrive prior to that time as the [facility] entrance doors do not open until 6:00 am.  REMEMBER: Instructions that are not followed completely may result in serious medical risk, up to and including death; or upon the discretion of your surgeon and anesthesiologist your surgery may need to be rescheduled.  Do not eat food or drink any fluids after midnight the night before surgery.  No gum chewing, lozengers or hard candies.   TAKE THESE MEDICATIONS THE MORNING OF SURGERY WITH A SIP OF WATER:  - citalopram (CELEXA) - midodrine (PROAMATINE) - omeprazole (PRILOSEC) - (take one the night before and one on the morning of surgery - helps to prevent nausea after surgery.) - potassium chloride SA  - pregabalin (LYRICA)   One week prior to surgery: Stop Anti-inflammatories (NSAIDS) such as Advil, Aleve, Ibuprofen, Motrin, Naproxen, Naprosyn and Aspirin based products such as Excedrin, Goodys Powder, BC Powder.  Stop ANY OVER THE COUNTER supplements until after surgery.  You may however, continue to take Tylenol if needed for pain up until the day of surgery.  No Alcohol for 24 hours before or after surgery.  No Smoking including e-cigarettes for 24 hours prior to surgery.  No chewable tobacco products for at least 6 hours prior to surgery.  No nicotine patches on the day of surgery.  Do not use any "recreational" drugs for at least a week prior to your surgery.  Please be advised that the combination of cocaine and anesthesia may have negative outcomes, up to and including death. If you test positive for cocaine, your surgery will be cancelled.  On the morning of surgery brush your teeth with toothpaste and water, you may rinse  your mouth with mouthwash if you wish. Do not swallow any toothpaste or mouthwash.  Do not wear jewelry, make-up, hairpins, clips or nail polish.  Do not wear lotions, powders, or perfumes.   Do not shave body from the neck down 48 hours prior to surgery just in case you cut yourself which could leave a site for infection.  Also, freshly shaved skin may become irritated if using the CHG soap.  Contact lenses, hearing aids and dentures may not be worn into surgery.  Do not bring valuables to the hospital. [facility] is not responsible for any missing/lost belongings or valuables.   Notify your doctor if there is any change in your medical condition (cold, fever, infection).  Wear comfortable clothing (specific to your surgery type) to the hospital.  After surgery, you can help prevent lung complications by doing breathing exercises.  Take deep breaths and cough every 1-2 hours. Your doctor may order a device called an Incentive Spirometer to help you take deep breaths. When coughing or sneezing, hold a pillow firmly against your incision with both hands. This is called "splinting." Doing this helps protect your incision. It also decreases belly discomfort.  If you are being admitted to the hospital overnight, leave your suitcase in the car. After surgery it may be brought to your room.  If you are being discharged the day of surgery, you will not be allowed to drive home. You will need a responsible adult (18 years or older) to drive you home and stay  with you that night.   If you are taking public transportation, you will need to have a responsible adult (18 years or older) with you. Please confirm with your physician that it is acceptable to use public transportation.   Please call the Pre-admissions Testing Dept. at [phone] if you have any questions about these instructions.  Surgery Visitation Policy:  Patients undergoing a surgery or procedure may have two family  members or support persons with them as long as the person is not COVID-19 positive or experiencing its symptoms.   Inpatient Visitation:    Visiting hours are 7 a.m. to 8 p.m. Up to four visitors are allowed at one time in a patient room, including children. The visitors may rotate out with other people during the day. One designated support person (adult) may remain overnight.

## 2022-04-30 ENCOUNTER — Ambulatory Visit: Payer: Medicare HMO | Attending: Family | Admitting: Family

## 2022-04-30 ENCOUNTER — Telehealth: Payer: Self-pay | Admitting: *Deleted

## 2022-04-30 ENCOUNTER — Encounter: Payer: Self-pay | Admitting: Urology

## 2022-04-30 ENCOUNTER — Encounter: Payer: Self-pay | Admitting: Family

## 2022-04-30 VITALS — BP 128/78 | HR 58 | Resp 18 | Ht 62.0 in | Wt 190.0 lb

## 2022-04-30 DIAGNOSIS — I110 Hypertensive heart disease with heart failure: Secondary | ICD-10-CM | POA: Diagnosis not present

## 2022-04-30 DIAGNOSIS — I639 Cerebral infarction, unspecified: Secondary | ICD-10-CM | POA: Insufficient documentation

## 2022-04-30 DIAGNOSIS — R531 Weakness: Secondary | ICD-10-CM | POA: Diagnosis not present

## 2022-04-30 DIAGNOSIS — E079 Disorder of thyroid, unspecified: Secondary | ICD-10-CM | POA: Diagnosis not present

## 2022-04-30 DIAGNOSIS — N182 Chronic kidney disease, stage 2 (mild): Secondary | ICD-10-CM | POA: Diagnosis not present

## 2022-04-30 DIAGNOSIS — M5481 Occipital neuralgia: Secondary | ICD-10-CM | POA: Insufficient documentation

## 2022-04-30 DIAGNOSIS — R55 Syncope and collapse: Secondary | ICD-10-CM | POA: Diagnosis not present

## 2022-04-30 DIAGNOSIS — I25118 Atherosclerotic heart disease of native coronary artery with other forms of angina pectoris: Secondary | ICD-10-CM

## 2022-04-30 DIAGNOSIS — F32A Depression, unspecified: Secondary | ICD-10-CM | POA: Diagnosis not present

## 2022-04-30 DIAGNOSIS — I2510 Atherosclerotic heart disease of native coronary artery without angina pectoris: Secondary | ICD-10-CM | POA: Diagnosis not present

## 2022-04-30 DIAGNOSIS — F419 Anxiety disorder, unspecified: Secondary | ICD-10-CM | POA: Diagnosis not present

## 2022-04-30 DIAGNOSIS — W19XXXD Unspecified fall, subsequent encounter: Secondary | ICD-10-CM

## 2022-04-30 DIAGNOSIS — I5022 Chronic systolic (congestive) heart failure: Secondary | ICD-10-CM | POA: Insufficient documentation

## 2022-04-30 DIAGNOSIS — K5790 Diverticulosis of intestine, part unspecified, without perforation or abscess without bleeding: Secondary | ICD-10-CM | POA: Insufficient documentation

## 2022-04-30 DIAGNOSIS — I10 Essential (primary) hypertension: Secondary | ICD-10-CM

## 2022-04-30 DIAGNOSIS — E559 Vitamin D deficiency, unspecified: Secondary | ICD-10-CM | POA: Insufficient documentation

## 2022-04-30 DIAGNOSIS — E785 Hyperlipidemia, unspecified: Secondary | ICD-10-CM | POA: Insufficient documentation

## 2022-04-30 DIAGNOSIS — M5136 Other intervertebral disc degeneration, lumbar region: Secondary | ICD-10-CM | POA: Diagnosis not present

## 2022-04-30 DIAGNOSIS — I130 Hypertensive heart and chronic kidney disease with heart failure and stage 1 through stage 4 chronic kidney disease, or unspecified chronic kidney disease: Secondary | ICD-10-CM | POA: Insufficient documentation

## 2022-04-30 DIAGNOSIS — K219 Gastro-esophageal reflux disease without esophagitis: Secondary | ICD-10-CM | POA: Insufficient documentation

## 2022-04-30 NOTE — Patient Instructions (Signed)
Continue weighing daily and call for an overnight weight gain of 3 pounds or more or a weekly weight gain of more than 5 pounds.   If you have voicemail, please make sure your mailbox is cleaned out so that we may leave a message and please make sure to listen to any voicemails.     

## 2022-04-30 NOTE — Telephone Encounter (Deleted)
Request for pre-operative cardiac clearance Received: Today Karen Kitchens, NP  P Cv Div Preop Callback Request for pre-operative cardiac clearance:     1. What type of surgery is being performed?  CYSTOSCOPY WITH BLADDER BIOPSY; CYSTOSCOPY WITH RETROGRADE PYELOGRAM  2. When is this surgery scheduled?  05/06/2022     3. Type of clearance being requested (medical, pharmacy, both).  MEDICAL     4. Are there any medications that need to be held prior to surgery?  NONE - Ok to continue daily low dose ASA per surgeon   5. Practice name and name of physician performing surgery?  Performing surgeon: Dr. Hollice Espy, MD  Requesting clearance: Honor Loh, FNP-C       6. Anesthesia type (none, local, MAC, general)?  GENERAL   7. What is the office phone and fax number?    Phone: [phone]  Fax: [phone]   ATTENTION: Unable to create telephone message as per your standard workflow. Directed by [facility] providers to send requests for cardiac clearance to this pool for appropriate distribution to provider covering pre-operative clearances.   Honor Loh, MSN, APRN, FNP-C, CEN  [facility]  Peri-operative Services Nurse Practitioner  Phone: [phone]  04/30/22 10:28 AM

## 2022-04-30 NOTE — Telephone Encounter (Signed)
Seen 04/30/22 by Darylene Price, NP.   Will route to her on input for clearance.   Loel Dubonnet, NP

## 2022-04-30 NOTE — Telephone Encounter (Signed)
-----   Message from Karen Kitchens, NP sent at 04/30/2022 10:28 AM EDT ----- Regarding: Request for pre-operative cardiac clearance Request for pre-operative cardiac clearance:  1. What type of surgery is being performed?  CYSTOSCOPY WITH BLADDER BIOPSY; CYSTOSCOPY WITH RETROGRADE PYELOGRAM   2. When is this surgery scheduled?  05/06/2022  3. Type of clearance being requested (medical, pharmacy, both). MEDICAL   4. Are there any medications that need to be held prior to surgery? NONE - Ok to continue daily low dose ASA per surgeon  5. Practice name and name of physician performing surgery?  Performing surgeon: Dr. Hollice Espy, MD Requesting clearance: Honor Loh, FNP-C    6. Anesthesia type (none, local, MAC, general)? GENERAL  7. What is the office phone and fax number?   Phone: [phone] Fax: [phone]  ATTENTION: Unable to create telephone message as per your standard workflow. Directed by [facility] providers to send requests for cardiac clearance to this pool for appropriate distribution to provider covering pre-operative clearances.   Honor Loh, MSN, APRN, FNP-C, CEN [facility]  Peri-operative Services Nurse Practitioner Phone: [phone] 04/30/22 10:28 AM

## 2022-04-30 NOTE — Progress Notes (Signed)
Patient ID: Kimberly Cook, female    DOB: [birth year], 76 y.o.   MRN: [id number]  HPI  [name] is a 76 y/o female with a history of CAD, hyperlipidemia, HTN, CKD, stroke, syncope, thyroid disease, anxiety, DDD, depression, diverticulosis, GERD, occipital neuralgia, vitamin D deficiency and chronic heart failure.   Echo report from 03/01/22 reviewed and showed an EF of 45-50%. Echo report from 10/05/21 reviewed and showed an EF of 30-35% with small/moderate pleural effusion. Echo report from 08/26/21 reviewed and showed an EF of 30-35% along with moderate LAE and moderate MR.   RHC/LHC done 08/28/21 and showed: Prox RCA lesion is 20% stenosed.   1.  Mild nonobstructive coronary artery disease. 2.  Left ventricular angiography was not performed.  EF was 30 to 35% by echo. 3.  Right heart catheterization showed mildly elevated right and left filling pressures, mild pulmonary hypertension and mildly reduced cardiac output.  Admitted 04/11/22 due to syncope with subsequent fall. Recently diagnosed with UTI. Right periorbital hematoma and a laceration that was repaired in the ER. Carotid doppler U/S negative for significant stenosis of ICA's bilaterally. Neurosurgery and palliative consults obtained. HF meds held due to orthostasis. Discharged after 6 days. Admitted 03/02/22 due to confusion. UA + for UTI so given antibiotics. Discharged after 4 days. Was in the ED 02/01/22 due to increased urinary frequency. IVF and antibiotics given and she was released. Had 2 other ED visits the month of June.   She presents today for a follow-up visit with a chief complaint of minimal fatigue upon moderate exertion. Describes this as chronic in nature having been present for several years. She has associated cough, shortness of breath, pedal edema, chronic pain, anxiety, depression, weakness, light-headedness and headaches along with this. She denies any difficulty sleeping, abdominal distention, palpitations, chest  pain or weight gain.   She had a fall a couple of weeks ago and admits that she's now very nervous about getting up on her own. She also has weakness in her legs. She asks about PT/OT referral.   Past Medical History:  Diagnosis Date   Allergy    Anxiety    Arthritis    Back pain    Chronic HFrEF (heart failure with reduced ejection fraction) ([place])    a. 08/2021 echo: EF 30-35%, glob HK, GrI DD; b. 10/2021 Echo: EF 30-35%.   CKD (chronic kidney disease), stage II    Coronary artery disease    a. Mild to moderate CAD in LAD/diagonal by CTA (CT-FFR of apical LAD 0.79); b. 08/2021 Cath: LM nl, LAD min irregs, D1/2/3 nl, LCX nl, OM2/3 nl, RCA 20p, RPDA mild dzs, RPAV nl.   DDD (degenerative disc disease), lumbar    Depression    Diverticulosis    Essential hypertension    GERD (gastroesophageal reflux disease)    Headache    History of shingles    Hyperlipidemia    Hypothyroidism    Lung nodule    Mini stroke 2011   Moderate Pericardial effusion    a. 08/2021 Echo: moderate circumferential pericardial effusion w/o tamponade; b. 10/2021 Echo: EF 30-35%, small to mod circumferential pericardial effusion w/o tamponade.   NICM (nonischemic cardiomyopathy) ([place])    a. 08/2021 Echo: EF 30-35%, glob HK, GrI DD, nl RV fxn, mild-mod dil LA, mod circumferential pericardial eff w/o tamponade, Mod MR; b. 10/2021 Echo: EF 30-35%, glob HK.   Occipital neuralgia    Palpitations    Pleural effusion on left  a. 08/2021 s/p thoracentesis.   Pneumonia 2018   Prediabetes    Stroke [facility])    Stroke ([place])    MRI 04/2008 + left sup. frontal gyrus possibly puntate infarct    Syncope 2019   Urinary tract infection    Vitamin D deficiency    Past Surgical History:  Procedure Laterality Date   ABDOMINAL HYSTERECTOMY     BLADDER SURGERY     2003   BREAST EXCISIONAL BIOPSY Right Over 20 years    Benign   CHOLECYSTECTOMY     gastroplication      JOINT REPLACEMENT Left    KNEE   KNEE ARTHROSCOPY Left 2011    PULSE GENERATOR IMPLANT Left 01/31/2020   Procedure: PLACEMENT RIGHT FLANK PULSE GENERATOR VS REMOVAL SPINAL CORD STIMULATOR;  Surgeon: Deetta Perla, MD;  Location: [facility] ORS;  Service: Neurosurgery;  Laterality: Left;   PULSE GENERATOR IMPLANT Left 04/24/2020   Procedure: REPLACEMENT LEFT FLANK PULSE GENERATOR IMPLANT;  Surgeon: Deetta Perla, MD;  Location: [facility] ORS;  Service: Neurosurgery;  Laterality: Left;  MAC w/ local   right arm fracture     RIGHT/LEFT HEART CATH AND CORONARY ANGIOGRAPHY N/A 08/28/2021   Procedure: RIGHT/LEFT HEART CATH AND CORONARY ANGIOGRAPHY;  Surgeon: Wellington Hampshire, MD;  Location: [place] [facility];  Service: Cardiovascular;  Laterality: N/A;   SPINAL CORD STIMULATOR REMOVAL N/A 06/26/2020   Procedure: SPINAL CORD STIMULATOR REMOVAL;  Surgeon: Deetta Perla, MD;  Location: [facility] OR[facility];  Service: Neurosurgery;  Laterality: N/A;   THORACIC LAMINECTOMY FOR SPINAL CORD STIMULATOR N/A 01/24/2020   Procedure: THORACIC SPINAL CORD STIMULATOR PADDLE TRIAL VIA LAMINECTOMY;  Surgeon: Deetta Perla, MD;  Location: [facility] OR[facility];  Service: Neurosurgery;  Laterality: N/A;   TONSILLECTOMY AND ADENOIDECTOMY     TOTAL KNEE ARTHROPLASTY Left 04/17/2015   Procedure: LEFT TOTAL KNEE ARTHROPLASTY;  Surgeon: Paralee Cancel, MD;  Location: [facility];  Service: Orthopedics;  Laterality: Left;   Family History  Problem Relation Age of Onset   Heart disease Mother    Hypertension Mother    Diabetes Mother    Heart attack Mother 73   Heart disease Father    Heart attack Father 80   Breast cancer Maternal Aunt    Heart attack Brother    Social History   Tobacco Use   Smoking status: Never    Passive exposure: Yes   Smokeless tobacco: Never   Tobacco comments:    husbands and children smoked in home.   Substance Use Topics   Alcohol use: No   No Known Allergies  Prior to Admission medications   Medication Sig Start Date End Date Taking? Authorizing Provider  acetaminophen (TYLENOL) 500  MG tablet Take 1,000 mg by mouth every 8 (eight) hours as needed for moderate pain.   Yes [provider]  alendronate (FOSAMAX) 70 MG tablet Take 70 mg by mouth once a week.  10/23/19  Yes [provider]  ALPRAZolam Duanne Moron) 0.5 MG tablet Take 1 tablet (0.5 mg total) by mouth 2 (two) times daily as needed for anxiety. 03/06/22  Yes Wieting, Richard, MD  ascorbic acid (VITAMIN C) 500 MG tablet Take 500 mg by mouth daily.   Yes [provider]  aspirin EC 81 MG tablet Take 1 tablet (81 mg total) by mouth at bedtime. Swallow whole. 10/25/20  Yes McLean-Scocuzza, Nino Glow, MD  atorvastatin (LIPITOR) 20 MG tablet Take 1 tablet (20 mg total) by mouth daily. 06/20/20  Yes McLean-Scocuzza, Nino Glow, MD  Biotin 1 MG CAPS Take 1,000 mcg by mouth daily.   Yes [provider]  Carboxymethylcellulose Sodium (REFRESH LIQUIGEL) 1 % GEL Place 1 drop into both eyes at bedtime.   Yes [provider]  cetirizine (ZYRTEC) 5 MG tablet Take 5 mg by mouth daily.   Yes [provider]  Cholecalciferol (VITAMIN D3) 125 MCG (5000 UT) TABS Take 1 tablet (5,000 Units total) by mouth daily. 10/09/18  Yes McLean-Scocuzza, Nino Glow, MD  citalopram (CELEXA) 20 MG tablet Take 20 mg by mouth daily. 01/10/22  Yes [provider]  Cranberry 450 MG TABS Take 1 tablet by mouth every morning. 01/03/22  Yes [provider]  folic acid (FOLVITE) 948 MCG tablet Take 1 tablet (400 mcg total) by mouth daily. SEPARATE ALL SUPPLEMENTS TO LUNCH OR DINNER AND PRILOSEC NOT TO MESS W/THYROID MED 12/10/18  Yes McLean-Scocuzza, Nino Glow, MD  HYDROcodone-acetaminophen (NORCO/VICODIN) 5-325 MG tablet Take 1 tablet by mouth every 6 (six) hours as needed for moderate pain. 04/23/22 05/07/22 Yes [provider]  hydrocortisone cream 1 % Apply 1 Application topically 2 (two) times daily as needed for itching.   Yes [provider]  levothyroxine (SYNTHROID) 100 MCG tablet Take 1 tablet  (100 mcg total) by mouth daily before breakfast. Patient taking differently: Take 100 mcg by mouth daily. At bedtime 03/06/22  Yes Wieting, Richard, MD  magnesium oxide (MAG-OX) 400 (240 Mg) MG tablet Take 1 tablet (400 mg total) by mouth daily. 08/31/21  Yes Nicole Kindred A, DO  melatonin 5 MG TABS Take 1 tablet (5 mg total) by mouth at bedtime. 04/04/21  Yes Val Riles, MD  midodrine (PROAMATINE) 10 MG tablet Take 1 tablet (10 mg total) by mouth 3 (three) times daily with meals. 04/17/22  Yes Wouk, Ailene Rud, MD  montelukast (SINGULAIR) 10 MG tablet Take 1 tablet (10 mg total) by mouth at bedtime. 02/14/20  Yes McLean-Scocuzza, Nino Glow, MD  Multiple Vitamin (MULTIVITAMIN WITH MINERALS) TABS tablet Take 1 tablet by mouth daily.   Yes [provider]  omeprazole (PRILOSEC) 40 MG capsule Take 1 capsule (40 mg total) by mouth daily. After lunch 06/23/20  Yes McLean-Scocuzza, Nino Glow, MD  polyethylene glycol powder (GLYCOLAX/MIRALAX) 17 GM/SCOOP powder Take 17 g by mouth daily as needed for moderate constipation. 10/21/19  Yes McLean-Scocuzza, Nino Glow, MD  potassium chloride SA (KLOR-CON M) 20 MEQ tablet Take 1 tablet (20 mEq total) by mouth 2 (two) times daily. 08/30/21  Yes Nicole Kindred A, DO  pregabalin (LYRICA) 75 MG capsule Take 75 mg by mouth 3 (three) times daily.   Yes [provider]  traZODone (DESYREL) 100 MG tablet Take 100 mg by mouth at bedtime. 12/13/21  Yes [provider]  vitamin B-12 (CYANOCOBALAMIN) 1000 MCG tablet Take 1 tablet (1,000 mcg total) by mouth daily. 10/09/18  Yes McLean-Scocuzza, Nino Glow, MD  White Petrolatum-Mineral Oil (ARTIFICIAL TEARS) ointment Place 1 drop into both eyes daily. At bedtime    [provider]   'Review of Systems  Constitutional:  Positive for fatigue. Negative for appetite change.  HENT:  Negative for congestion, postnasal drip and sore throat.   Eyes: Negative.   Respiratory:  Positive for cough (dry) and  shortness of breath. Negative for chest tightness.   Cardiovascular:  Positive for leg swelling (left ankle). Negative for chest pain and palpitations.  Gastrointestinal:  Negative for abdominal distention and abdominal pain.  Endocrine: Negative.   Genitourinary: Negative.   Musculoskeletal:  Positive for arthralgias (right shoulder/ left knee) and back pain (chronic due to 4 rods in back). Negative for neck pain.  Skin: Negative.   Allergic/Immunologic: Negative.   Neurological:  Positive for weakness, light-headedness and headaches. Negative for dizziness.  Hematological:  Negative for adenopathy. Does not bruise/bleed easily.  Psychiatric/Behavioral:  Positive for dysphoric mood. Negative for sleep disturbance (sleeping on 1 pillow). The patient is nervous/anxious.    Vitals:   04/30/22 1248  BP: 128/78  Pulse: (!) 58  Resp: 18  SpO2: 100%  Weight: 190 lb (86.2 kg)  Height: _0  (1.575 m)   Wt Readings from Last 3 Encounters:  04/30/22 190 lb (86.2 kg)  04/17/22 187 lb 8 oz (85 kg)  04/10/22 185 lb (83.9 kg)   Lab Results  Component Value Date   CREATININE 0.92 04/16/2022   CREATININE 1.14 (H) 04/14/2022   CREATININE 1.22 (H) 04/13/2022   Physical Exam Vitals and nursing note reviewed. Exam conducted with a chaperone present Community education officer).  Constitutional:      Appearance: She is well-developed.  HENT:     Head: Normocephalic and atraumatic.  Cardiovascular:     Rate and Rhythm: Regular rhythm. Bradycardia present.  Pulmonary:     Effort: Pulmonary effort is normal. No respiratory distress.     Breath sounds: No wheezing or rales.  Abdominal:     General: There is no distension.     Palpations: Abdomen is soft.     Tenderness: There is no abdominal tenderness.  Musculoskeletal:     Cervical back: Normal range of motion and neck supple.     Right lower leg: No edema.     Left lower leg: No tenderness. Edema (trace pitting) present.  Skin:    General: Skin is warm  and dry.  Neurological:     General: No focal deficit present.     Mental Status: She is alert and oriented to person, place, and time.  Psychiatric:        Mood and Affect: Mood normal.        Behavior: Behavior normal.     Assessment & Plan:  1: Chronic heart failure with reduced ejection fraction- - NYHA class II - euvolemic today - being weighed daily; reminded to call for an overnight weight gain of > 2 pounds or a weekly weight gain of >5 pounds - weight up 5 pounds from last visit here 3 months ago - currently not adding salt except to grits - all GDMT had been stopped and midodrine started during recent admission due to hypotension and subsequent fall - does wear oxygen at 2L at bedtime and PRN during the day - order written for compression socks to be worn daily with removal at bedtime - BNP 04/16/22 was 159.1  2: HTN- - BP looks good (128/78)  - currently seeing PCP at facility - [facility] 04/16/22 reviewed and showed sodium 140, potassium 4.5, creatinine 0.92 and GFR >60  3: CAD- - saw cardiology ([place]) 03/12/22 - currently taking aspirin, atorvastatin  4: Weakness- - patient admits that her legs are weak and she's very anxious about standing/ walking because she's afraid she may fall again - order written for PT/OT evaluations   Facility medication list reviewed.   Return in 2 months, sooner if needed.

## 2022-05-01 NOTE — Telephone Encounter (Signed)
   Patient Name: Kimberly Cook  DOB: 01/21/1946 MRN: [id number]  Primary Cardiologist: Nelva Bush, MD  Chart reviewed as part of pre-operative protocol coverage.   Left message for patient to call back to discuss BP and surgical clearance.   Lenna Sciara, NP 05/01/2022, 11:28 AM

## 2022-05-02 ENCOUNTER — Encounter: Payer: Self-pay | Admitting: Urology

## 2022-05-02 ENCOUNTER — Encounter: Payer: Self-pay | Admitting: Urgent Care

## 2022-05-02 NOTE — Progress Notes (Signed)
Perioperative Services  Pre-Admission/Anesthesia Testing Clinical Review  Date: 05/03/22  Patient Demographics:  Name: Kimberly Cook DOB:   Oct 17, 1945 MRN:   [id number]  Planned Surgical Procedure(s):    Case: [id number] Date/Time: 05/06/22 1345   Procedures:      CYSTOSCOPY WITH BLADDER BIOPSY     CYSTOSCOPY WITH RETROGRADE PYELOGRAM (Bilateral)   Anesthesia type: General   Pre-op diagnosis: Bladder Erythema   Location: [place] OR ROOM 10 / [place] [facility]   Surgeons: Hollice Espy, MD   NOTE: Available PAT nursing documentation and vital signs have been reviewed. Clinical nursing staff has updated patient's PMH/PSHx, current medication list, and drug allergies/intolerances to ensure comprehensive history available to assist in medical decision making as it pertains to the aforementioned surgical procedure and anticipated anesthetic course. Extensive review of available clinical information performed. [place] PMH and PSHx updated with any diagnoses/procedures that  may have been inadvertently omitted during her intake with the pre-admission testing department's nursing staff.  Clinical Discussion:  Kimberly Cook is a 76 y.o. female who is submitted for pre-surgical anesthesia review and clearance prior to her undergoing the above procedure. Patient has never been a smoker. Pertinent PMH includes: CAD, NICM, HFrEF, CVA/TIA, palpitations, HTN, HLD, hypothyroidism, CKD, GERD (on daily PPI), hiatal hernia, shortness of breath (on supplemental oxygen), lung nodule, OA, chronic back pain, anxiety (on the BZO), depression.  Patient is followed by cardiology (End/Harding, MD). She was last seen in the cardiology clinic on 04/30/2022; notes reviewed.  At the time of her clinic visit, patient doing well following recent 6-day hospital admission for syncope resulting in fall with resulting facial (periorbital hematoma) and head  (5 cm forehead laceration) trauma. Work-up  revealed a urinary tract infection.  Heart failure medications were held due to orthostasis.  At the time of her follow-up with cardiology patient complained of cough, short of breath, peripheral edema, chronic pain, weakness, vertiginous symptoms, exertional fatigue, and increased anxiety/depression symptoms.  She denied any chest pain.  Patient on supplemental oxygen (2L/[place]) at bedtime and as needed during the day. Patient with a past medical history significant for cardiovascular diagnoses.  Coronary CTA performed on 04/30/2017 revealed an elevated coronary calcium score of 257.  This was in the 82nd percentile for age and sex matched control.  Study revealed a 25-50% stenosis of the proximal LAD and a 50-69% stenosis of D2.  Subsequent FFR analysis was performed (normal FFR range is >0.80):  Left main = no significant stenosis LAD = no significant stenosis in the proximal or mid LAD.  Very distal LAD = 0.79 D1, D2 = no significant stenosis LCx = no significant stenosis RCA = no significant stenosis  Most recent myocardial perfusion imaging study was performed on 09/13/2020 revealing a moderately decreased left ventricular systolic function with EF of 36%.  There was no evidence of stress-induced myocardial ischemia or arrhythmia.  Study determined to be low risk.  Diagnostic RIGHT/LEFT heart catheterization was performed on 08/28/2021.  Ventricular angiography was not performed.  EF was 30-35% by echocardiogram.  There was mild nonobstructive CAD noted; 20% proximal RCA, and mild/minimal luminal irregularities within the LAD.  Mean PA pressure = 27 mmHg.  Mean PCWP 16 mmHg.  AO saturation = 93.6%.  CO = 4.26 L/min.  CI 2.46 L/min/m.  Findings consistent with nonischemic cardiomyopathy.  Patient with mild volume overload that was treated with intravenous furosemide.  Most recent TTE was performed on 03/01/2022 revealing a mildly reduced left  ventricular systolic function with an EF of 45-50%.   There were no regional wall motion abnormalities.  Diastolic Doppler parameters normal.  GLS -16.8%.  Right ventricular size and function normal.  No significant valvular regurgitation. There was no evidence of a significant transvalvular gradient to suggest stenosis.  Blood pressure well controlled at 128/78 without the use of pharmacological intervention.  Of note, all of GDMT has been stopped due to hypotensive episodes resulting in orthostasis and falls.  In fact, patient with episodes of hypotension requiring the use of midodrine as needed.  Patient is on atorvastatin for her HLD diagnosis and further ASCVD prevention.  She is not diabetic. Patient does not have an OSAH diagnosis. Functional capacity, as defined by DASI, is documented as being </= 4 METS.  No changes were made to her medication regimen.  Patient to follow-up with outpatient cardiology/heart failure clinic and 2 months or sooner if needed.  [name] is scheduled for an CYSTOSCOPY WITH BLADDER BIOPSY; CYSTOSCOPY WITH BILATERAL RETROGRADE PYELOGRAMS on 05/06/2022 with Dr. Hollice Espy, MD. Given patient's past medical history significant for cardiovascular diagnoses, presurgical cardiac clearance was sought by the PAT team. Per cardiology, "based ACC/AHA guidelines, the patient's past medical history, and the amount of time since her last clinic visit, this patient would be at an overall ACCEPTABLE risk for the planned procedure without further cardiovascular testing or intervention at this time".  In review of her medication reconciliation, it is noted that patient is on daily antiplatelet therapy.  Per primary attending surgeon, patient may continue her daily low-dose ASA throughout her perioperative course.    Patient denies any previous complications with anesthesia in the past. In review of the available records, it is noted that patient underwent a general anesthetic course at [facility] ([facility]) in  01/2021 without documented complications.      04/30/2022   12:48 PM 04/17/2022    4:22 PM 04/17/2022    9:00 AM  Vitals with BMI  Height _0     Weight 190 lbs    BMI 64.33    Systolic 295 188 416  Diastolic 78 56 67  Pulse 58 59 65    Providers/Specialists:   NOTE: Primary physician provider listed below. Patient may have been seen by APP or partner within same practice.   PROVIDER ROLE / SPECIALTY LAST Lu Duffel, MD Urology (Surgeon) 04/10/2022  System, Provider Not In Primary Care Provider ???  End, Harrell Gave, MD Cardiology 03/12/2022  Darylene Price, FNP-C Advanced Heart Failure 04/30/2022   Allergies:  Patient has no known allergies.  Current Home Medications:   No current facility-administered medications for this encounter.    acetaminophen (TYLENOL) 500 MG tablet   alendronate (FOSAMAX) 70 MG tablet   ALPRAZolam (XANAX) 0.5 MG tablet   ascorbic acid (VITAMIN C) 500 MG tablet   aspirin EC 81 MG tablet   atorvastatin (LIPITOR) 20 MG tablet   Biotin 1 MG CAPS   Carboxymethylcellulose Sodium (REFRESH LIQUIGEL) 1 % GEL   cetirizine (ZYRTEC) 5 MG tablet   Cholecalciferol (VITAMIN D3) 125 MCG (5000 UT) TABS   citalopram (CELEXA) 20 MG tablet   Cranberry 606 MG TABS   folic acid (FOLVITE) 301 MCG tablet   HYDROcodone-acetaminophen (NORCO/VICODIN) 5-325 MG tablet   hydrocortisone cream 1 %   levothyroxine (SYNTHROID) 100 MCG tablet   magnesium oxide (MAG-OX) 400 (240 Mg) MG tablet   melatonin 5 MG TABS   midodrine (PROAMATINE) 10 MG tablet  montelukast (SINGULAIR) 10 MG tablet   Multiple Vitamin (MULTIVITAMIN WITH MINERALS) TABS tablet   omeprazole (PRILOSEC) 40 MG capsule   polyethylene glycol powder (GLYCOLAX/MIRALAX) 17 GM/SCOOP powder   potassium chloride SA (KLOR-CON M) 20 MEQ tablet   pregabalin (LYRICA) 75 MG capsule   traZODone (DESYREL) 100 MG tablet   vitamin B-12 (CYANOCOBALAMIN) 1000 MCG tablet   White Petrolatum-Mineral Oil  (ARTIFICIAL TEARS) ointment   History:   Past Medical History:  Diagnosis Date   Acute respiratory failure, unsp w hypoxia or hypercapnia (HCC)    Allergy    Anxiety    a.) on BZO (alprazolam) PRN   Aortic atherosclerosis (HCC)    Arthritis    Back pain    Chronic HFrEF (heart failure with reduced ejection fraction) ([place])    a. 08/2021 echo: EF 30-35%, glob HK, GrI DD; b. 10/2021 Echo: EF 30-35%.   CKD (chronic kidney disease), stage II    Coronary artery disease    a. Mild to moderate CAD in LAD/diagonal by CTA (CT-FFR of apical LAD 0.79); b. 08/2021 Cath: LM nl, LAD min irregs, D1/2/3 nl, LCX nl, OM2/3 nl, RCA 20p, RPDA mild dzs, RPAV nl.   DDD (degenerative disc disease), lumbar    Depression    Diverticulosis    Essential hypertension    GERD (gastroesophageal reflux disease)    Headache    Hiatal hernia    History of shingles    HLD (hyperlipidemia)    Hypothyroidism    Lung nodule    Mini stroke 2011   Moderate Pericardial effusion    a. 08/2021 Echo: moderate circumferential pericardial effusion w/o tamponade; b. 10/2021 Echo: EF 30-35%, small to mod circumferential pericardial effusion w/o tamponade.   Muscle weakness    NICM (nonischemic cardiomyopathy) ([place])    a. 08/2021 Echo: EF 30-35%, glob HK, GrI DD, nl RV fxn, mild-mod dil LA, mod circumferential pericardial eff w/o tamponade, Mod MR; b. 10/2021 Echo: EF 30-35%, glob HK.   Occipital neuralgia    On supplemental oxygen by nasal cannula    a.) 2L.[place] at bedtime and PRN   Palpitations    Pancytopenia (HCC)    Pleural effusion on left    a. 08/2021 s/p thoracentesis.   Pneumonia 2018   Polyneuropathy    Prediabetes    Renal cyst, left    a.) Renal artery [place] 12/26/2017: measured 2.1 cm.   Sleep difficulties    a.) takes trazodone + melatonin   Stroke ([place])    MRI 04/2008 + left sup. frontal gyrus possibly puntate infarct    Syncope 2019   Vitamin B deficiency    Vitamin D deficiency    Past Surgical History:   Procedure Laterality Date   ABDOMINAL HYSTERECTOMY     BLADDER SURGERY     2003   BREAST EXCISIONAL BIOPSY Right Over 20 years    Benign   CHOLECYSTECTOMY     gastroplication      KNEE ARTHROSCOPY Left 2011   PULSE GENERATOR IMPLANT Left 01/31/2020   Procedure: PLACEMENT RIGHT FLANK PULSE GENERATOR VS REMOVAL SPINAL CORD STIMULATOR;  Surgeon: Deetta Perla, MD;  Location: [facility]S;  Service: Neurosurgery;  Laterality: Left;   PULSE GENERATOR IMPLANT Left 04/24/2020   Procedure: REPLACEMENT LEFT FLANK PULSE GENERATOR IMPLANT;  Surgeon: Deetta Perla, MD;  Location: [facility] ORS;  Service: Neurosurgery;  Laterality: Left;  MAC w/ local   REPLACEMENT TOTAL KNEE Left    right arm fracture     RIGHT/LEFT  HEART CATH AND CORONARY ANGIOGRAPHY N/A 08/28/2021   Procedure: RIGHT/LEFT HEART CATH AND CORONARY ANGIOGRAPHY;  Surgeon: Wellington Hampshire, MD;  Location: [facility];  Service: Cardiovascular;  Laterality: N/A;   SPINAL CORD STIMULATOR REMOVAL N/A 06/26/2020   Procedure: SPINAL CORD STIMULATOR REMOVAL;  Surgeon: Deetta Perla, MD;  Location: [facility] ORS;  Service: Neurosurgery;  Laterality: N/A;   THORACIC LAMINECTOMY FOR SPINAL CORD STIMULATOR N/A 01/24/2020   Procedure: THORACIC SPINAL CORD STIMULATOR PADDLE TRIAL VIA LAMINECTOMY;  Surgeon: Deetta Perla, MD;  Location: [facility] ORS;  Service: Neurosurgery;  Laterality: N/A;   TONSILLECTOMY AND ADENOIDECTOMY     TOTAL KNEE ARTHROPLASTY Left 04/17/2015   Procedure: LEFT TOTAL KNEE ARTHROPLASTY;  Surgeon: Paralee Cancel, MD;  Location: [facility];  Service: Orthopedics;  Laterality: Left;   Family History  Problem Relation Age of Onset   Heart disease Mother    Hypertension Mother    Diabetes Mother    Heart attack Mother 74   Heart disease Father    Heart attack Father 53   Breast cancer Maternal Aunt    Heart attack Brother    Social History   Tobacco Use   Smoking status: Never    Passive exposure: Yes   Smokeless tobacco: Never    Tobacco comments:    husbands and children smoked in home.   Vaping Use   Vaping Use: Never used  Substance Use Topics   Alcohol use: No   Drug use: No    Pertinent Clinical Results:  LABS: Labs reviewed: Acceptable for surgery.  Lab Results  Component Value Date   WBC 4.6 04/14/2022   HGB 10.5 (L) 04/14/2022   HCT 31.6 (L) 04/14/2022   MCV 93.2 04/14/2022   PLT 151 04/14/2022   Lab Results  Component Value Date   NA 140 04/16/2022   K 4.5 04/16/2022   CO2 27 04/16/2022   GLUCOSE 88 04/16/2022   BUN 31 (H) 04/16/2022   CREATININE 0.92 04/16/2022   CALCIUM 9.1 04/16/2022   EGFR 57 (L) 09/11/2021   GFRNONAA >60 04/16/2022    ECG: Date: 04/11/2022 Time ECG obtained: 2018 PM Rate: 73 bpm Rhythm: normal sinus Axis (leads I and aVF): Normal Intervals: PR 166 ms. QRS 84 ms. QTc 506 ms. ST segment and T wave changes: No evidence of acute ST segment elevation or depression Comparison: Previous tracing obtained on 03/12/2022 revealed sinus rhythm with PACs; LVH with repolarization abnormality.   IMAGING / PROCEDURES: TRANSTHORACIC ECHOCARDIOGRAM performed on 03/01/2022 Left ventricular ejection fraction, by estimation, is 45 to 50%. The left ventricle has mildly decreased function. The left ventricle has no regional wall motion abnormalities. Left ventricular diastolic parameters are indeterminate. The average left ventricular global longitudinal strain is -16.8 %.  Right ventricular systolic function is normal. The right ventricular size is normal. There is normal pulmonary artery systolic pressure.  The mitral valve is normal in structure. No evidence of mitral valve regurgitation. No evidence of mitral stenosis.  The aortic valve is normal in structure. Aortic valve regurgitation is not visualized. No aortic stenosis is present.  The inferior vena cava is normal in size with greater than 50% respiratory variability, suggesting right atrial pressure of 3 mmHg.   RIGHT/LEFT  HEART CATHETERIZATION performed on 08/28/2021 Mild nonobstructive CAD 20% stenosis of the proximal RCA Minimal luminal irregularities within the LAD Left ventricular angiography was not performed.  EF was 30-35% by echo.   Hemodynamics LVEDP = 20 mmHg Mean PA pressure = 27  mmHg Mean PCWP = 16 mmHg Ao saturation 93.6% Cardiac output = 4.26 L/min Cardiac index = 2.36 L/min/m Recommendations Medical therapy for nonischemic cardiomyopathy Continues to be mildly overloaded.  Continue IV furosemide tonight and switch to oral torsemide tomorrow morning.   MYOCARDIAL PERFUSION IMAGING STUDY (LEXISCAN) performed on 09/13/2020 Normal left ventricular systolic function with a moderately reduced left ventricular ejection fraction of 36%  Normal myocardial thickening and wall motion Left ventricular cavity size normal SPECT images demonstrate homogenous tracer distribution throughout the myocardium No evidence of stress-induced myocardial ischemia or arrhythmia Normal low risk study  CT CORONARY MORPH W/CTA COR W/SCORE W/CA W/CM &/OR WO/CM AND FRACTIONAL FLOW RESERVE FLUID ANALYSIS performed on 04/30/2017 Coronary calcium score of 257. This was 61 percentile for age and sex matched control. Normal coronary origin with right dominance. Mild non-obstructive CAD in the proximal LAD and moderate CAD is in ostium of a small second diagonal branch. Aggressive risk factor modification is recommended. FFR analysis (normal FFR range >0.80): Left Main:  No significant stenosis. LAD: No significant stenosis in the proximal or mid LAD, very distal LAD has CT FFR 0.79. D1, D2 no significant stenosis. LCX: No significant stenosis. RCA: No significant stenosis  LONG TERM CARDIAC EVENT MONITOR STUDY performed on 03/20/2017 Predominant underlying sinus rhythm with average rate of 93 bpm (range 65-143 bpm) Occasional PACs and rare PVCs No sustained arrhythmias A brief episode of nonconducted P waves was  noted; appears to be blocked PACs though brief episode of 2-1 AV block cannot be excluded (<5-second duration) No reported patient's symptoms  Impression and Plan:  Kimberly Cook has been referred for pre-anesthesia review and clearance prior to her undergoing the planned anesthetic and procedural courses. Available labs, pertinent testing, and imaging results were personally reviewed by me. This patient has been appropriately cleared by cardiology with an overall ACCEPTABLE risk of significant perioperative cardiovascular complications.  Based on clinical review performed today (05/03/22), barring any significant acute changes in the patient's overall condition, it is anticipated that she will be able to proceed with the planned surgical intervention. Any acute changes in clinical condition may necessitate her procedure being postponed and/or cancelled. Patient will meet with anesthesia team (MD and/or CRNA) on the day of her procedure for preoperative evaluation/assessment. Questions regarding anesthetic course will be fielded at that time.   Pre-surgical instructions were reviewed with the patient during her PAT appointment and questions were fielded by PAT clinical staff. Patient was advised that if any questions or concerns arise prior to her procedure then she should return a call to PAT and/or her surgeon's office to discuss.  Honor Loh, MSN, APRN, FNP-C, CEN [facility]  Peri-operative Services Nurse Practitioner Phone: [phone] Fax: [phone] 05/03/22 8:24 AM  NOTE: This note has been prepared using Dragon dictation software. Despite my best ability to proofread, there is always the potential that unintentional transcriptional errors may still occur from this process.

## 2022-05-02 NOTE — Telephone Encounter (Signed)
   Name: Kimberly Cook  DOB: [birth year]  MRN: [id number]   Primary Cardiologist: Nelva Bush, MD  Chart reviewed as part of pre-operative protocol coverage. Patient was contacted 05/02/2022 in reference to pre-operative risk assessment for pending surgery as outlined below.  Kimberly Cook was last seen on 04/30/2022 by Darylene Price. Since that day, Kimberly Cook has done well from a cardiac standpoint. Her BP has been stable. She denies any new symptoms or concerns.  Therefore, based on ACC/AHA guidelines, the patient would be at acceptable risk for the planned procedure without further cardiovascular testing.   The patient was advised that if she develops new symptoms prior to surgery to contact our office to arrange for a follow-up visit, and she verbalized understanding.   I will route this recommendation to the requesting party via Epic fax function and remove from pre-op pool. Please call with questions.  Lenna Sciara, NP 05/02/2022, 1:36 PM

## 2022-05-02 NOTE — Telephone Encounter (Signed)
Patient returned NP's call.

## 2022-05-02 NOTE — Telephone Encounter (Signed)
I left a message for the patient to call our office to scheduled a tele visit for pre-op.

## 2022-05-03 ENCOUNTER — Encounter: Payer: Self-pay | Admitting: Urology

## 2022-05-05 MED ORDER — LACTATED RINGERS IV SOLN: INTRAVENOUS | Status: DC

## 2022-05-05 MED ORDER — ORAL CARE MOUTH RINSE: 15.0000 mL | Freq: Once | OROMUCOSAL | Status: AC

## 2022-05-05 MED ORDER — GENTAMICIN SULFATE 40 MG/ML IJ SOLN
5.0000 mg/kg | INTRAVENOUS | Status: DC
  Filled 2022-05-05: qty 10.75

## 2022-05-05 MED ORDER — CHLORHEXIDINE GLUCONATE 0.12 % MT SOLN: 15.0000 mL | Freq: Once | OROMUCOSAL | Status: AC

## 2022-05-06 ENCOUNTER — Encounter
Admission: RE | Disposition: A | Discharge status: Skilled Nursing Facility | Payer: Self-pay | Source: Home / Self Care | Attending: Urology

## 2022-05-06 ENCOUNTER — Encounter: Payer: Self-pay | Admitting: Urology

## 2022-05-06 ENCOUNTER — Other Ambulatory Visit: Payer: Self-pay

## 2022-05-06 ENCOUNTER — Ambulatory Visit
Admission: RE | Admit: 2022-05-06 | Discharge: 2022-05-06 | Disposition: A | Discharge status: Skilled Nursing Facility | Payer: Medicare HMO | Attending: Urology | Admitting: Urology

## 2022-05-06 DIAGNOSIS — Z538 Procedure and treatment not carried out for other reasons: Secondary | ICD-10-CM | POA: Diagnosis not present

## 2022-05-06 DIAGNOSIS — N3289 Other specified disorders of bladder: Secondary | ICD-10-CM | POA: Diagnosis not present

## 2022-05-06 HISTORY — DX: Sleep disorder, unspecified: G47.9

## 2022-05-06 HISTORY — DX: Hyperlipidemia, unspecified: E78.5

## 2022-05-06 HISTORY — DX: Diaphragmatic hernia without obstruction or gangrene: K44.9

## 2022-05-06 HISTORY — DX: Other specified health status: Z78.9

## 2022-05-06 HISTORY — DX: Atherosclerosis of aorta: I70.0

## 2022-05-06 HISTORY — DX: Cyst of kidney, acquired: N28.1

## 2022-05-06 LAB — URINALYSIS, ROUTINE W REFLEX MICROSCOPIC
Bilirubin Urine: NEGATIVE
Glucose, UA: NEGATIVE mg/dL
Hgb urine dipstick: NEGATIVE
Ketones, ur: NEGATIVE mg/dL
Nitrite: NEGATIVE
Protein, ur: NEGATIVE mg/dL
Specific Gravity, Urine: 1.014 (ref 1.005–1.030)
WBC, UA: >50 WBC/hpf — ABNORMAL HIGH (ref 0–5)
pH: 7 (ref 5.0–8.0)

## 2022-05-06 SURGERY — CYSTOSCOPY, WITH BIOPSY
Anesthesia: General

## 2022-05-06 MED ORDER — CHLORHEXIDINE GLUCONATE 0.12 % MT SOLN
OROMUCOSAL | Status: AC
  Administered 2022-05-06: 15 mL via OROMUCOSAL
  Filled 2022-05-06: qty 15

## 2022-05-06 MED ORDER — FENTANYL CITRATE (PF) 100 MCG/2ML IJ SOLN
INTRAMUSCULAR | Status: AC
  Filled 2022-05-06: qty 2

## 2022-05-06 SURGICAL SUPPLY — 24 items
BAG DRAIN SIEMENS DORNER NS (MISCELLANEOUS) ×2 IMPLANT
BRUSH SCRUB EZ  4% CHG (MISCELLANEOUS) ×2
BRUSH SCRUB EZ 1% IODOPHOR (MISCELLANEOUS) ×2 IMPLANT
BRUSH SCRUB EZ 4% CHG (MISCELLANEOUS) ×2 IMPLANT
CATH URETL OPEN 5X70 (CATHETERS) ×2 IMPLANT
DRAPE UTILITY 15X26 TOWEL STRL (DRAPES) ×2 IMPLANT
DRSG TELFA 3X4 N-ADH STERILE (GAUZE/BANDAGES/DRESSINGS) ×2 IMPLANT
ELECT REM PT RETURN 9FT ADLT (ELECTROSURGICAL) ×2
ELECTRODE REM PT RTRN 9FT ADLT (ELECTROSURGICAL) ×2 IMPLANT
GAUZE 4X4 16PLY ~~LOC~~+RFID DBL (SPONGE) ×4 IMPLANT
GLOVE BIO SURGEON STRL SZ 6.5 (GLOVE) ×2 IMPLANT
GOWN STRL REUS W/ TWL LRG LVL3 (GOWN DISPOSABLE) ×4 IMPLANT
GOWN STRL REUS W/TWL LRG LVL3 (GOWN DISPOSABLE) ×4
GUIDEWIRE STR DUAL SENSOR (WIRE) ×2 IMPLANT
IV NS IRRIG 3000ML ARTHROMATIC (IV SOLUTION) ×2 IMPLANT
KIT TURNOVER CYSTO (KITS) ×2 IMPLANT
NDL SAFETY ECLIP 18X1.5 (MISCELLANEOUS) ×2 IMPLANT
PACK CYSTO AR (MISCELLANEOUS) ×2 IMPLANT
SET CYSTO W/LG BORE CLAMP LF (SET/KITS/TRAYS/PACK) ×2 IMPLANT
SURGILUBE 2OZ TUBE FLIPTOP (MISCELLANEOUS) ×2 IMPLANT
TRAP FLUID SMOKE EVACUATOR (MISCELLANEOUS) ×2 IMPLANT
WATER STERILE IRR 1000ML POUR (IV SOLUTION) ×2 IMPLANT
WATER STERILE IRR 3000ML UROMA (IV SOLUTION) ×2 IMPLANT
WATER STERILE IRR 500ML POUR (IV SOLUTION) ×2 IMPLANT

## 2022-05-06 NOTE — Progress Notes (Signed)
Patient was scheduled for bladder biopsy today however unfortunately, her repeat preoperative urine culture was never completed and that she has not been on periprocedural antibiotics to reduce her bacterial load and there is a higher risk for infectious complications.  As such, elected to for today, we will go ahead and get a preop urine culture today and reschedule for the near future.  Kimberly Cook and her family are disappointed but understand and agree with this plan.  Hollice Espy, MD

## 2022-05-06 NOTE — Progress Notes (Signed)
Pt surgery canceled by surgeon. Requesting pt be on periprocedural abx from urine culture. Urine culture collected and sent to lab. Driver arrived from [facility] and pt left via transport.

## 2022-05-06 NOTE — Anesthesia Preprocedure Evaluation (Addendum)
Anesthesia Evaluation  Patient identified by MRN, date of birth, ID band Patient awake    Reviewed: Allergy & Precautions, NPO status , Patient's Chart, lab work & pertinent test results  History of Anesthesia Complications Negative for: history of anesthetic complications  Airway Mallampati: III  TM Distance: >3 FB Neck ROM: full    Dental  (+) Dental Advidsory Given, Edentulous Lower, Edentulous Upper   Pulmonary neg pulmonary ROS, neg shortness of breath, neg recent URI,    Pulmonary exam normal        Cardiovascular hypertension, + CAD  (-) Past MI and (-) CABG Normal cardiovascular exam     Neuro/Psych PSYCHIATRIC DISORDERS CVA, No Residual Symptoms    GI/Hepatic negative GI ROS, Neg liver ROS,   Endo/Other  Hypothyroidism   Renal/GU Renal disease     Musculoskeletal   Abdominal   Peds  Hematology negative hematology ROS (+)   Anesthesia Other Findings Past Medical History: No date: Acute respiratory failure, unsp w hypoxia or hypercapnia  (HCC) No date: Allergy No date: Anxiety     Comment:  a.) on BZO (alprazolam) PRN No date: Aortic atherosclerosis (HCC) No date: Arthritis No date: Back pain No date: Chronic HFrEF (heart failure with reduced ejection fraction)  ([place])     Comment:  a. 08/2021 echo: EF 30-35%, glob HK, GrI DD; b. 10/2021               Echo: EF 30-35%. No date: CKD (chronic kidney disease), stage II No date: Coronary artery disease     Comment:  a. Mild to moderate CAD in LAD/diagonal by CTA (CT-FFR               of apical LAD 0.79); b. 08/2021 Cath: LM nl, LAD min               irregs, D1/2/3 nl, LCX nl, OM2/3 nl, RCA 20p, RPDA mild               dzs, RPAV nl. No date: DDD (degenerative disc disease), lumbar No date: Depression No date: Diverticulosis No date: Essential hypertension No date: GERD (gastroesophageal reflux disease) No date: Headache No date: Hiatal hernia No date:  History of shingles No date: HLD (hyperlipidemia) No date: Hypothyroidism No date: Lung nodule 2011: Mini stroke No date: Moderate Pericardial effusion     Comment:  a. 08/2021 Echo: moderate circumferential pericardial               effusion w/o tamponade; b. 10/2021 Echo: EF 30-35%, small               to mod circumferential pericardial effusion w/o               tamponade. No date: Muscle weakness No date: NICM (nonischemic cardiomyopathy) ([place])     Comment:  a. 08/2021 Echo: EF 30-35%, glob HK, GrI DD, nl RV fxn,               mild-mod dil LA, mod circumferential pericardial eff w/o               tamponade, Mod MR; b. 10/2021 Echo: EF 30-35%, glob HK. No date: Occipital neuralgia No date: On supplemental oxygen by nasal cannula     Comment:  a.) 2L.[place] at bedtime and PRN No date: Palpitations No date: Pancytopenia (HCC) No date: Pleural effusion on left     Comment:  a. 08/2021 s/p thoracentesis. 2018: Pneumonia No date: Polyneuropathy No date:  Prediabetes No date: Renal cyst, left     Comment:  a.) Renal artery [place] 12/26/2017: measured 2.1 cm. No date: Sleep difficulties     Comment:  a.) takes trazodone + melatonin No date: Stroke [facility])     Comment:  MRI 04/2008 + left sup. frontal gyrus possibly puntate               infarct  2019: Syncope No date: Vitamin B deficiency No date: Vitamin D deficiency  Past Surgical History: No date: ABDOMINAL HYSTERECTOMY No date: BLADDER SURGERY     Comment:  2003 Over 20 years : BREAST EXCISIONAL BIOPSY; Right     Comment:  Benign No date: CHOLECYSTECTOMY No date: gastroplication  1856: KNEE ARTHROSCOPY; Left 01/31/2020: PULSE GENERATOR IMPLANT; Left     Comment:  Procedure: PLACEMENT RIGHT FLANK PULSE GENERATOR VS               REMOVAL SPINAL CORD STIMULATOR;  Surgeon: Deetta Perla,               MD;  Location: [facility] ORS;  Service: Neurosurgery;                Laterality: Left; 04/24/2020: PULSE GENERATOR IMPLANT; Left     Comment:   Procedure: REPLACEMENT LEFT FLANK PULSE GENERATOR               IMPLANT;  Surgeon: Deetta Perla, MD;  Location: [facility] ORS;              Service: Neurosurgery;  Laterality: Left;  MAC w/ local No date: REPLACEMENT TOTAL KNEE; Left No date: right arm fracture 08/28/2021: RIGHT/LEFT HEART CATH AND CORONARY ANGIOGRAPHY; N/A     Comment:  Procedure: RIGHT/LEFT HEART CATH AND CORONARY               ANGIOGRAPHY;  Surgeon: Wellington Hampshire, MD;  Location:               [facility];  Service: Cardiovascular;                Laterality: N/A; 06/26/2020: SPINAL CORD STIMULATOR REMOVAL; N/A     Comment:  Procedure: SPINAL CORD STIMULATOR REMOVAL;  Surgeon:               Deetta Perla, MD;  Location: [facility] ORS;  Service:               Neurosurgery;  Laterality: N/A; 01/24/2020: THORACIC LAMINECTOMY FOR SPINAL CORD STIMULATOR; N/A     Comment:  Procedure: THORACIC SPINAL CORD STIMULATOR PADDLE TRIAL               VIA LAMINECTOMY;  Surgeon: Deetta Perla, MD;  Location:               [facility] ORS;  Service: Neurosurgery;  Laterality: N/A; No date: TONSILLECTOMY AND ADENOIDECTOMY 04/17/2015: TOTAL KNEE ARTHROPLASTY; Left     Comment:  Procedure: LEFT TOTAL KNEE ARTHROPLASTY;  Surgeon:               Paralee Cancel, MD;  Location: [facility] OR[facility];  Service:               Orthopedics;  Laterality: Left;  BMI    Body Mass Index: 34.76 kg/m      Reproductive/Obstetrics negative OB ROS                            Anesthesia Physical  Anesthesia Plan  ASA: 3  Anesthesia Plan: General ETT   Post-op Pain Management:    Induction: Intravenous  PONV Risk Score and Plan: Ondansetron, Dexamethasone, Midazolam and Treatment may vary due to age or medical condition  Airway Management Planned: Oral ETT  Additional Equipment:   Intra-op Plan:   Post-operative Plan: Extubation in OR  Informed Consent: I have reviewed the patients History and Physical, chart, labs and discussed the  procedure including the risks, benefits and alternatives for the proposed anesthesia with the patient or authorized representative who has indicated his/her understanding and acceptance.     Dental Advisory Given  Plan Discussed with: Anesthesiologist, CRNA and Surgeon  Anesthesia Plan Comments: (Patient consented for risks of anesthesia including but not limited to:  - adverse reactions to medications - damage to eyes, teeth, lips or other oral mucosa - nerve damage due to positioning  - sore throat or hoarseness - Damage to heart, brain, nerves, lungs, other parts of body or loss of life  Patient voiced understanding.)        Anesthesia Quick Evaluation

## 2022-05-08 ENCOUNTER — Other Ambulatory Visit: Payer: Self-pay

## 2022-05-08 ENCOUNTER — Telehealth: Payer: Self-pay

## 2022-05-08 DIAGNOSIS — N3289 Other specified disorders of bladder: Secondary | ICD-10-CM

## 2022-05-08 LAB — URINE CULTURE: Culture: >=100000 — AB

## 2022-05-08 MED ORDER — NITROFURANTOIN MONOHYD MACRO 100 MG PO CAPS: 100.0000 mg | ORAL_CAPSULE | Freq: Two times a day (BID) | ORAL | 0 refills | Status: DC

## 2022-05-08 NOTE — Telephone Encounter (Signed)
-----   Message from Hollice Espy, MD sent at 05/08/2022  9:04 AM EDT ----- Please treat ASAP with macrobid bid x 10 days and change preop abx from ancef to gentamicin dosing per pharmacy  Hollice Espy, MD

## 2022-05-08 NOTE — Telephone Encounter (Signed)
Placed new orders for surgery and updated Pre-OP abx to Gentamycin.

## 2022-05-08 NOTE — Telephone Encounter (Signed)
Informed patient and macrobid sent in.

## 2022-05-09 ENCOUNTER — Encounter: Payer: Self-pay | Admitting: Neurosurgery

## 2022-05-09 ENCOUNTER — Ambulatory Visit (INDEPENDENT_AMBULATORY_CARE_PROVIDER_SITE_OTHER): Payer: Medicare HMO | Admitting: Neurosurgery

## 2022-05-09 ENCOUNTER — Telehealth: Payer: Self-pay | Admitting: Urology

## 2022-05-09 VITALS — BP 135/67 | HR 55 | Ht 62.0 in | Wt 190.0 lb

## 2022-05-09 DIAGNOSIS — M6281 Muscle weakness (generalized): Secondary | ICD-10-CM | POA: Diagnosis not present

## 2022-05-09 DIAGNOSIS — F0781 Postconcussional syndrome: Secondary | ICD-10-CM | POA: Diagnosis not present

## 2022-05-09 DIAGNOSIS — R262 Difficulty in walking, not elsewhere classified: Secondary | ICD-10-CM | POA: Diagnosis not present

## 2022-05-09 DIAGNOSIS — R2689 Other abnormalities of gait and mobility: Secondary | ICD-10-CM | POA: Diagnosis not present

## 2022-05-09 DIAGNOSIS — G44309 Post-traumatic headache, unspecified, not intractable: Secondary | ICD-10-CM

## 2022-05-09 NOTE — Telephone Encounter (Signed)
Attempted to follow up with [facility] regarding request.  NO answer or voicemail.

## 2022-05-09 NOTE — Progress Notes (Signed)
DOS: 01/12/21 and 01/15/21 - XLIFs and T4-pelvis  HISTORY OF PRESENT ILLNESS:  05/09/2022 [name] returns to see me.  She had a fall in early September.  She had a significant scalp hematoma.  She had no intracranial findings.  She continues to have headaches, which did not plague her before the fall.  Her thinking is otherwise normal.  10/25/21 Phineas Semen is status post thoracopelvic fusion.   She continues to have some back pain. She is doing much better than she was prior to surgery. She is able to walk with a rollator.  PHYSICAL EXAMINATION:  General: Patient is well developed, well nourished, calm, collected, and in no apparent distress.  NEUROLOGICAL:  General: In no acute distress.  Awake, alert, oriented to person, place, and time. Pupils equal round and reactive to light. Facial tone is symmetric. Tongue protrusion is midline. There is no pronator drift.  Strength:  Side Iliopsoas Quads Hamstring PF DF EHL  R 5 5 5 5 5 5   L 5 5 5 5 5 5    Incision well-healed  ROS (Neurologic): Negative except as noted above  IMAGING: 06/22/21 lumbar xrays 10/25/21 T and L spine xrays No interval changes   CT Head 04/11/22 IMPRESSION: 1. No acute intracranial abnormality. 2. No acute displaced facial fracture. 3. No acute displaced fracture or traumatic listhesis of the cervical spine. 4. Right frontal scalp 1 cm hematoma formation. Associated slight hyperdensity measuring 6 x 3 mm that could represent a retained radiopaque foreign body.   Electronically Signed: By: Iven Finn M.D. On: 04/11/2022 19:08  ASSESSMENT/PLAN:  Kimberly Cook is here to be evaluated for minor head trauma.  Her scalp hematoma is resolving.  I would not recommend surgical intervention for that.  She appears to be suffering from postconcussive syndrome.  I will refer her to Dr. Candelaria Stagers from the [facility] clinic for evaluation and treatment.    I will see her back on an as-needed basis.  She  is cleared to proceed with her bladder biopsy next week.   I spent a total of 15 minutes in both face-to-face and non-face-to-face activities for this visit on the date of this encounter.  Meade Maw MD, [facility] Department of Neurosurgery

## 2022-05-09 NOTE — Telephone Encounter (Signed)
Kimberly Cook also requested to send any communication to her fax number [phone].  The other fax is up front which receives ALL patient requests and she does not always receives them.  Thank you

## 2022-05-09 NOTE — Telephone Encounter (Signed)
Haze Rushing, Unit Mgr, [facility], called asking about the UA culture.  [phone] ask for [facility] [place])

## 2022-05-10 DIAGNOSIS — R2689 Other abnormalities of gait and mobility: Secondary | ICD-10-CM | POA: Diagnosis not present

## 2022-05-10 DIAGNOSIS — R262 Difficulty in walking, not elsewhere classified: Secondary | ICD-10-CM | POA: Diagnosis not present

## 2022-05-10 DIAGNOSIS — M6281 Muscle weakness (generalized): Secondary | ICD-10-CM | POA: Diagnosis not present

## 2022-05-13 ENCOUNTER — Ambulatory Visit: Payer: Medicare HMO | Admitting: Physician Assistant

## 2022-05-13 ENCOUNTER — Other Ambulatory Visit: Payer: Self-pay

## 2022-05-13 ENCOUNTER — Ambulatory Visit: Payer: Medicare HMO | Admitting: Anesthesiology

## 2022-05-13 ENCOUNTER — Telehealth: Payer: Self-pay

## 2022-05-13 ENCOUNTER — Telehealth: Payer: Self-pay | Admitting: Urology

## 2022-05-13 ENCOUNTER — Ambulatory Visit
Admission: RE | Admit: 2022-05-13 | Discharge: 2022-05-13 | Disposition: A | Discharge status: Skilled Nursing Facility | Payer: Medicare HMO | Source: Ambulatory Visit | Attending: Urology | Admitting: Urology

## 2022-05-13 ENCOUNTER — Ambulatory Visit: Payer: Medicare HMO

## 2022-05-13 ENCOUNTER — Encounter: Payer: Self-pay | Admitting: Urology

## 2022-05-13 ENCOUNTER — Encounter
Admission: RE | Disposition: A | Discharge status: Skilled Nursing Facility | Payer: Self-pay | Source: Ambulatory Visit | Attending: Urology

## 2022-05-13 DIAGNOSIS — R531 Weakness: Secondary | ICD-10-CM | POA: Diagnosis not present

## 2022-05-13 DIAGNOSIS — Z87440 Personal history of urinary (tract) infections: Secondary | ICD-10-CM | POA: Diagnosis not present

## 2022-05-13 DIAGNOSIS — N3000 Acute cystitis without hematuria: Secondary | ICD-10-CM | POA: Diagnosis present

## 2022-05-13 DIAGNOSIS — R109 Unspecified abdominal pain: Secondary | ICD-10-CM | POA: Diagnosis not present

## 2022-05-13 DIAGNOSIS — Z66 Do not resuscitate: Secondary | ICD-10-CM | POA: Diagnosis present

## 2022-05-13 DIAGNOSIS — N281 Cyst of kidney, acquired: Secondary | ICD-10-CM | POA: Diagnosis not present

## 2022-05-13 DIAGNOSIS — M5136 Other intervertebral disc degeneration, lumbar region: Secondary | ICD-10-CM | POA: Diagnosis present

## 2022-05-13 DIAGNOSIS — F0781 Postconcussional syndrome: Secondary | ICD-10-CM

## 2022-05-13 DIAGNOSIS — Z20822 Contact with and (suspected) exposure to covid-19: Secondary | ICD-10-CM | POA: Diagnosis present

## 2022-05-13 DIAGNOSIS — F321 Major depressive disorder, single episode, moderate: Secondary | ICD-10-CM | POA: Diagnosis not present

## 2022-05-13 DIAGNOSIS — N3090 Cystitis, unspecified without hematuria: Secondary | ICD-10-CM | POA: Diagnosis not present

## 2022-05-13 DIAGNOSIS — R1111 Vomiting without nausea: Secondary | ICD-10-CM | POA: Diagnosis not present

## 2022-05-13 DIAGNOSIS — G709 Myoneural disorder, unspecified: Secondary | ICD-10-CM | POA: Insufficient documentation

## 2022-05-13 DIAGNOSIS — N3289 Other specified disorders of bladder: Secondary | ICD-10-CM | POA: Insufficient documentation

## 2022-05-13 DIAGNOSIS — R110 Nausea: Secondary | ICD-10-CM | POA: Diagnosis not present

## 2022-05-13 DIAGNOSIS — K449 Diaphragmatic hernia without obstruction or gangrene: Secondary | ICD-10-CM | POA: Insufficient documentation

## 2022-05-13 DIAGNOSIS — I130 Hypertensive heart and chronic kidney disease with heart failure and stage 1 through stage 4 chronic kidney disease, or unspecified chronic kidney disease: Secondary | ICD-10-CM | POA: Diagnosis present

## 2022-05-13 DIAGNOSIS — N3001 Acute cystitis with hematuria: Secondary | ICD-10-CM | POA: Diagnosis present

## 2022-05-13 DIAGNOSIS — R262 Difficulty in walking, not elsewhere classified: Secondary | ICD-10-CM | POA: Diagnosis not present

## 2022-05-13 DIAGNOSIS — E860 Dehydration: Secondary | ICD-10-CM | POA: Diagnosis present

## 2022-05-13 DIAGNOSIS — R079 Chest pain, unspecified: Secondary | ICD-10-CM | POA: Diagnosis not present

## 2022-05-13 DIAGNOSIS — N1831 Chronic kidney disease, stage 3a: Secondary | ICD-10-CM | POA: Diagnosis present

## 2022-05-13 DIAGNOSIS — R2689 Other abnormalities of gait and mobility: Secondary | ICD-10-CM | POA: Diagnosis not present

## 2022-05-13 DIAGNOSIS — F32A Depression, unspecified: Secondary | ICD-10-CM | POA: Diagnosis present

## 2022-05-13 DIAGNOSIS — I2510 Atherosclerotic heart disease of native coronary artery without angina pectoris: Secondary | ICD-10-CM | POA: Insufficient documentation

## 2022-05-13 DIAGNOSIS — I10 Essential (primary) hypertension: Secondary | ICD-10-CM | POA: Insufficient documentation

## 2022-05-13 DIAGNOSIS — N201 Calculus of ureter: Secondary | ICD-10-CM | POA: Diagnosis not present

## 2022-05-13 DIAGNOSIS — Z7982 Long term (current) use of aspirin: Secondary | ICD-10-CM | POA: Diagnosis not present

## 2022-05-13 DIAGNOSIS — R112 Nausea with vomiting, unspecified: Secondary | ICD-10-CM | POA: Diagnosis not present

## 2022-05-13 DIAGNOSIS — E039 Hypothyroidism, unspecified: Secondary | ICD-10-CM | POA: Diagnosis present

## 2022-05-13 DIAGNOSIS — Z79899 Other long term (current) drug therapy: Secondary | ICD-10-CM | POA: Diagnosis not present

## 2022-05-13 DIAGNOSIS — R319 Hematuria, unspecified: Secondary | ICD-10-CM | POA: Insufficient documentation

## 2022-05-13 DIAGNOSIS — E872 Acidosis, unspecified: Secondary | ICD-10-CM | POA: Diagnosis present

## 2022-05-13 DIAGNOSIS — N390 Urinary tract infection, site not specified: Secondary | ICD-10-CM | POA: Insufficient documentation

## 2022-05-13 DIAGNOSIS — I959 Hypotension, unspecified: Secondary | ICD-10-CM | POA: Diagnosis present

## 2022-05-13 DIAGNOSIS — E785 Hyperlipidemia, unspecified: Secondary | ICD-10-CM | POA: Diagnosis present

## 2022-05-13 DIAGNOSIS — Z8673 Personal history of transient ischemic attack (TIA), and cerebral infarction without residual deficits: Secondary | ICD-10-CM | POA: Diagnosis not present

## 2022-05-13 DIAGNOSIS — I5022 Chronic systolic (congestive) heart failure: Secondary | ICD-10-CM | POA: Diagnosis present

## 2022-05-13 DIAGNOSIS — K219 Gastro-esophageal reflux disease without esophagitis: Secondary | ICD-10-CM | POA: Insufficient documentation

## 2022-05-13 DIAGNOSIS — Z96642 Presence of left artificial hip joint: Secondary | ICD-10-CM | POA: Diagnosis present

## 2022-05-13 DIAGNOSIS — M6281 Muscle weakness (generalized): Secondary | ICD-10-CM | POA: Diagnosis not present

## 2022-05-13 DIAGNOSIS — R000 Tachycardia, unspecified: Secondary | ICD-10-CM | POA: Diagnosis not present

## 2022-05-13 DIAGNOSIS — Z8249 Family history of ischemic heart disease and other diseases of the circulatory system: Secondary | ICD-10-CM | POA: Diagnosis not present

## 2022-05-13 DIAGNOSIS — R8271 Bacteriuria: Secondary | ICD-10-CM | POA: Diagnosis not present

## 2022-05-13 DIAGNOSIS — Z90710 Acquired absence of both cervix and uterus: Secondary | ICD-10-CM | POA: Diagnosis not present

## 2022-05-13 DIAGNOSIS — R7989 Other specified abnormal findings of blood chemistry: Secondary | ICD-10-CM | POA: Diagnosis not present

## 2022-05-13 DIAGNOSIS — R69 Illness, unspecified: Secondary | ICD-10-CM | POA: Diagnosis not present

## 2022-05-13 DIAGNOSIS — Z743 Need for continuous supervision: Secondary | ICD-10-CM | POA: Diagnosis not present

## 2022-05-13 DIAGNOSIS — E538 Deficiency of other specified B group vitamins: Secondary | ICD-10-CM | POA: Diagnosis present

## 2022-05-13 HISTORY — PX: CYSTOSCOPY WITH BIOPSY: SHX5122

## 2022-05-13 HISTORY — PX: CYSTOSCOPY W/ URETERAL STENT PLACEMENT: SHX1429

## 2022-05-13 SURGERY — CYSTOSCOPY, WITH BIOPSY
Anesthesia: General

## 2022-05-13 MED ORDER — GLYCOPYRROLATE 0.2 MG/ML IJ SOLN
INTRAMUSCULAR | Status: AC
  Filled 2022-05-13: qty 1

## 2022-05-13 MED ORDER — GLYCOPYRROLATE 0.2 MG/ML IJ SOLN
INTRAMUSCULAR | Status: DC | PRN
  Administered 2022-05-13: .2 mg via INTRAVENOUS

## 2022-05-13 MED ORDER — FENTANYL CITRATE (PF) 100 MCG/2ML IJ SOLN: 25.0000 ug | INTRAMUSCULAR | Status: DC | PRN

## 2022-05-13 MED ORDER — STERILE WATER FOR IRRIGATION IR SOLN
Status: DC | PRN
  Administered 2022-05-13: 4500 mL

## 2022-05-13 MED ORDER — PROPOFOL 10 MG/ML IV BOLUS
INTRAVENOUS | Status: DC | PRN
  Administered 2022-05-13: 150 mg via INTRAVENOUS

## 2022-05-13 MED ORDER — OXYCODONE HCL 5 MG PO TABS: 5.0000 mg | ORAL_TABLET | Freq: Once | ORAL | Status: DC | PRN

## 2022-05-13 MED ORDER — DEXAMETHASONE SODIUM PHOSPHATE 10 MG/ML IJ SOLN
INTRAMUSCULAR | Status: DC | PRN
  Administered 2022-05-13: 10 mg via INTRAVENOUS

## 2022-05-13 MED ORDER — DEXAMETHASONE SODIUM PHOSPHATE 10 MG/ML IJ SOLN
INTRAMUSCULAR | Status: AC
  Filled 2022-05-13: qty 1

## 2022-05-13 MED ORDER — CHLORHEXIDINE GLUCONATE 0.12 % MT SOLN
OROMUCOSAL | Status: AC
  Filled 2022-05-13: qty 15

## 2022-05-13 MED ORDER — GENTAMICIN SULFATE 40 MG/ML IJ SOLN
5.0000 mg/kg | INTRAVENOUS | Status: DC
  Filled 2022-05-13: qty 10.75

## 2022-05-13 MED ORDER — OXYCODONE HCL 5 MG/5ML PO SOLN: 5.0000 mg | Freq: Once | ORAL | Status: DC | PRN

## 2022-05-13 MED ORDER — LIDOCAINE HCL (PF) 2 % IJ SOLN
INTRAMUSCULAR | Status: AC
  Filled 2022-05-13: qty 5

## 2022-05-13 MED ORDER — CEFAZOLIN SODIUM-DEXTROSE 2-4 GM/100ML-% IV SOLN
INTRAVENOUS | Status: AC
  Filled 2022-05-13: qty 100

## 2022-05-13 MED ORDER — LIDOCAINE HCL (PF) 2 % IJ SOLN
INTRAMUSCULAR | Status: DC | PRN
  Administered 2022-05-13: 50 mg

## 2022-05-13 MED ORDER — OXYBUTYNIN CHLORIDE 5 MG PO TABS: 5.0000 mg | ORAL_TABLET | Freq: Three times a day (TID) | ORAL | 0 refills | Status: DC | PRN

## 2022-05-13 MED ORDER — ONDANSETRON HCL 4 MG/2ML IJ SOLN
INTRAMUSCULAR | Status: AC
  Filled 2022-05-13: qty 2

## 2022-05-13 MED ORDER — HYDROCODONE-ACETAMINOPHEN 5-325 MG PO TABS: 1.0000 | ORAL_TABLET | Freq: Four times a day (QID) | ORAL | 0 refills | Status: DC | PRN

## 2022-05-13 MED ORDER — CHLORHEXIDINE GLUCONATE 0.12 % MT SOLN: 15.0000 mL | Freq: Once | OROMUCOSAL | Status: DC

## 2022-05-13 MED ORDER — FENTANYL CITRATE (PF) 100 MCG/2ML IJ SOLN
INTRAMUSCULAR | Status: AC
  Filled 2022-05-13: qty 2

## 2022-05-13 MED ORDER — FENTANYL CITRATE (PF) 100 MCG/2ML IJ SOLN
INTRAMUSCULAR | Status: DC | PRN
  Administered 2022-05-13: 50 ug via INTRAVENOUS
  Administered 2022-05-13 (×2): 25 ug via INTRAVENOUS

## 2022-05-13 MED ORDER — ORAL CARE MOUTH RINSE: 15.0000 mL | Freq: Once | OROMUCOSAL | Status: DC

## 2022-05-13 MED ORDER — IOHEXOL 180 MG/ML  SOLN
INTRAMUSCULAR | Status: DC | PRN
  Administered 2022-05-13: 30 mL

## 2022-05-13 MED ORDER — LACTATED RINGERS IV SOLN: INTRAVENOUS | Status: DC

## 2022-05-13 MED ORDER — ONDANSETRON HCL 4 MG/2ML IJ SOLN
INTRAMUSCULAR | Status: DC | PRN
  Administered 2022-05-13: 4 mg via INTRAVENOUS

## 2022-05-13 SURGICAL SUPPLY — 20 items
BAG DRAIN SIEMENS DORNER NS (MISCELLANEOUS) ×3 IMPLANT
BAG DRN NS LF (MISCELLANEOUS) ×3
BRUSH SCRUB EZ 1% IODOPHOR (MISCELLANEOUS) ×3 IMPLANT
CATH URETL OPEN 5X70 (CATHETERS) ×3 IMPLANT
DRAPE UTILITY 15X26 TOWEL STRL (DRAPES) ×3 IMPLANT
DRSG TELFA 3X4 N-ADH STERILE (GAUZE/BANDAGES/DRESSINGS) ×3 IMPLANT
ELECT REM PT RETURN 9FT ADLT (ELECTROSURGICAL) ×3
ELECTRODE REM PT RTRN 9FT ADLT (ELECTROSURGICAL) ×3 IMPLANT
GLOVE BIO SURGEON STRL SZ 6.5 (GLOVE) ×3 IMPLANT
GOWN STRL REUS W/ TWL LRG LVL3 (GOWN DISPOSABLE) ×6 IMPLANT
GOWN STRL REUS W/TWL LRG LVL3 (GOWN DISPOSABLE) ×6
GUIDEWIRE ANG ZIPWIRE 035X150 (WIRE) ×1 IMPLANT
GUIDEWIRE STR DUAL SENSOR (WIRE) ×3 IMPLANT
KIT TURNOVER CYSTO (KITS) ×3 IMPLANT
PACK CYSTO AR (MISCELLANEOUS) ×3 IMPLANT
SET CYSTO W/LG BORE CLAMP LF (SET/KITS/TRAYS/PACK) ×3 IMPLANT
STENT URET 6FRX24 CONTOUR (STENTS) ×1 IMPLANT
SURGILUBE 2OZ TUBE FLIPTOP (MISCELLANEOUS) ×3 IMPLANT
WATER STERILE IRR 3000ML UROMA (IV SOLUTION) ×3 IMPLANT
WATER STERILE IRR 500ML POUR (IV SOLUTION) ×3 IMPLANT

## 2022-05-13 NOTE — H&P (Signed)
05/13/22  RRR CTAB  PReop UCx + Treated with periprocedural abx Periop abx gent to cover ESBL organism  CC:     Chief Complaint  Patient presents with   Cysto      HPI: 76 year old female with a personal history of bacteriuria in early May who presents today for cystoscopic evaluation of bladder wall thickening.   Notably, she was treated for the past 5 days with Bactrim DS in the setting of chronic bacteriuria in light of today's cystoscopy.   There were no vitals taken for this visit. NED. A&Ox3.   No respiratory distress   Abd soft, NT, ND Normal external genitalia with patent urethral meatus   Past Medical History:  Diagnosis Date   Acute respiratory failure, unsp w hypoxia or hypercapnia (HCC)    Allergy    Anxiety    a.) on BZO (alprazolam) PRN   Aortic atherosclerosis (HCC)    Arthritis    Back pain    Chronic HFrEF (heart failure with reduced ejection fraction) ([place])    a. 08/2021 echo: EF 30-35%, glob HK, GrI DD; b. 10/2021 Echo: EF 30-35%.   CKD (chronic kidney disease), stage II    Coronary artery disease    a. Mild to moderate CAD in LAD/diagonal by CTA (CT-FFR of apical LAD 0.79); b. 08/2021 Cath: LM nl, LAD min irregs, D1/2/3 nl, LCX nl, OM2/3 nl, RCA 20p, RPDA mild dzs, RPAV nl.   DDD (degenerative disc disease), lumbar    Depression    Diverticulosis    Essential hypertension    GERD (gastroesophageal reflux disease)    Headache    Hiatal hernia    History of shingles    HLD (hyperlipidemia)    Hypothyroidism    Lung nodule    Mini stroke 2011   Moderate Pericardial effusion    a. 08/2021 Echo: moderate circumferential pericardial effusion w/o tamponade; b. 10/2021 Echo: EF 30-35%, small to mod circumferential pericardial effusion w/o tamponade.   Muscle weakness    NICM (nonischemic cardiomyopathy) ([place])    a. 08/2021 Echo: EF 30-35%, glob HK, GrI DD, nl RV fxn, mild-mod dil LA, mod circumferential pericardial eff w/o tamponade, Mod MR; b. 10/2021  Echo: EF 30-35%, glob HK.   Occipital neuralgia    On supplemental oxygen by nasal cannula    a.) 2L.[place] at bedtime and PRN   Palpitations    Pancytopenia (HCC)    Pleural effusion on left    a. 08/2021 s/p thoracentesis.   Pneumonia 2018   Polyneuropathy    Prediabetes    Renal cyst, left    a.) Renal artery [place] 12/26/2017: measured 2.1 cm.   Sleep difficulties    a.) takes trazodone + melatonin   Stroke ([place])    MRI 04/2008 + left sup. frontal gyrus possibly puntate infarct    Syncope 2019   Vitamin B deficiency    Vitamin D deficiency     Cystoscopy Procedure Note   Patient identification was confirmed, informed consent was obtained, and patient was prepped using Betadine solution.  Lidocaine jelly was administered per urethral meatus.     Procedure: - Flexible cystoscope introduced, without any difficulty.   - Thorough search of the bladder revealed:    normal urethral meatus    Significant urinary debris with extensive squamous metaplasia at the bladder neck involving the trigone    Area of patchy erythema on right anterior lateral bladder wall with hypervascularity, nonspecific, cannot exclude CIS versus cystitis  no stones    no ulcers     no tumors    Mild/ moderate trabeculation   Post-Procedure: - Patient tolerated the procedure well   Assessment/ Plan:   1.  Bladder wall thickening/bladder erythema Differential diagnosis includes CIS versus chronic cystitis from bacterial colonization   Based on the appearance today, I recommended consideration of cystoscopy with bladder biopsy in the operating room.  We discussed the risk and benefits including risk of bleeding, infection, demonstrating structures amongst others.  We also plan to proceed with bilateral retrograde pyelogram well intraoperatively.  She is agreeable this plan.   We will plan for another preoperative urine culture and likely need to treat perioperatively based on her history.   Hollice Espy,  MD

## 2022-05-13 NOTE — Telephone Encounter (Signed)
Referral faxed to [facility]. 

## 2022-05-13 NOTE — Telephone Encounter (Signed)
[place] asking for the dosage of and how often to give pt Oxybutynin.  Please fax order to her direct fax [phone]. Please ASAP Pharmacy closes at 5:00

## 2022-05-13 NOTE — Telephone Encounter (Signed)
Left patient a message on v/m that the ortho referral was cancelled and changed to neurology. She can call them at the end of the week if she has not heard from them.

## 2022-05-13 NOTE — Telephone Encounter (Signed)
Referral placed to Neurology. Please notify the patient/facility. Thanks

## 2022-05-13 NOTE — Telephone Encounter (Signed)
Linzie Collin, RN Dr.Kubinski reviewed referral and said that the patient should follow-up with Neurology not him. Do you want to place a new referral?

## 2022-05-13 NOTE — Transfer of Care (Signed)
Immediate Anesthesia Transfer of Care Note  Patient: Kimberly Cook  Procedure(s) Performed: CYSTOSCOPY WITH BLADDER BIOPSY CYSTOSCOPY WITH RETROGRADE PYELOGRAM/URETERAL LEFT STENT PLACEMENT (Left)  Patient Location: PACU  Anesthesia Type:General  Level of Consciousness: awake  Airway & Oxygen Therapy: Patient Spontanous Breathing and Patient connected to face mask oxygen  Post-op Assessment: Report given to RN and Post -op Vital signs reviewed and stable  Post vital signs: Reviewed  Last Vitals:  Vitals Value Taken Time  BP    Temp    Pulse 91 05/13/22 1233  Resp 11 05/13/22 1233  SpO2 97 % 05/13/22 1233  Vitals shown include unvalidated device data.  Last Pain:  Vitals:   05/13/22 1020  TempSrc: Temporal  PainSc: 0-No pain         Complications: No notable events documented.

## 2022-05-13 NOTE — Anesthesia Postprocedure Evaluation (Signed)
Anesthesia Post Note  Patient: ISADORA DELOREY  Procedure(s) Performed: CYSTOSCOPY WITH BLADDER BIOPSY CYSTOSCOPY WITH RETROGRADE PYELOGRAM/URETERAL LEFT STENT PLACEMENT (Left)  Patient location during evaluation: PACU Anesthesia Type: General Level of consciousness: awake and alert Pain management: pain level controlled Vital Signs Assessment: post-procedure vital signs reviewed and stable Respiratory status: spontaneous breathing, nonlabored ventilation, respiratory function stable and patient connected to nasal cannula oxygen Cardiovascular status: blood pressure returned to baseline and stable Postop Assessment: no apparent nausea or vomiting Anesthetic complications: no   No notable events documented.   Last Vitals:  Vitals:   05/13/22 1254 05/13/22 1316  BP: (!) 138/91 (!) 161/94  Pulse: 80 73  Resp: 18 18  Temp: 36.6 C 36.8 C  SpO2: 98% 100%    Last Pain:  Vitals:   05/13/22 1316  TempSrc: Temporal  PainSc: 0-No pain                 Precious Haws Marielis Samara

## 2022-05-13 NOTE — Discharge Instructions (Addendum)
You have a ureteral stent in place.  This is a tube that extends from your kidney to your bladder.  This may cause urinary bleeding, burning with urination, and urinary frequency.  Please call our office or present to the ED if you develop fevers >101 or pain which is not able to be controlled with oral pain medications.  You may be given either Flomax and/ or ditropan to help with bladder spasms and stent pain in addition to pain medications.    [facility] [address] [place], [place] [address] [phone]  Transurethral Resection of Bladder Tumor (TURBT) or Bladder Biopsy   Definition:  Transurethral Resection of the Bladder Tumor is a surgical procedure used to diagnose and remove tumors within the bladder. TURBT is the most common treatment for early stage bladder cancer.  General instructions:     Your recent bladder surgery requires very little post hospital care but some definite precautions.  Despite the fact that no skin incisions were used, the area around the bladder incisions are raw and covered with scabs to promote healing and prevent bleeding. Certain precautions are needed to insure that the scabs are not disturbed over the next 2-4 weeks while the healing proceeds.  Because the raw surface inside your bladder and the irritating effects of urine you may expect frequency of urination and/or urgency (a stronger desire to urinate) and perhaps even getting up at night more often. This will usually resolve or improve slowly over the healing period. You may see some blood in your urine over the first 6 weeks. Do not be alarmed, even if the urine was clear for a while. Get off your feet and drink lots of fluids until clearing occurs. If you start to pass clots or don't improve call us.  Diet:  You may return to your normal diet immediately. Because of the raw surface of your bladder, alcohol, spicy foods, foods high in acid and  drinks with caffeine may cause irritation or frequency and should be used in moderation. To keep your urine flowing freely and avoid constipation, drink plenty of fluids during the day (8-10 glasses). Tip: Avoid cranberry juice because it is very acidic.  Activity:  Your physical activity doesn't need to be restricted. However, if you are very active, you may see some blood in the urine. We suggest that you reduce your activity under the circumstances until the bleeding has stopped.  Bowels:  It is important to keep your bowels regular during the postoperative period. Straining with bowel movements can cause bleeding. A bowel movement every other day is reasonable. Use a mild laxative if needed, such as milk of magnesia 2-3 tablespoons, or 2 Dulcolax tablets. Call if you continue to have problems. If you had been taking narcotics for pain, before, during or after your surgery, you may be constipated. Take a laxative if necessary.    Medication:  You should resume your pre-surgery medications unless told not to. In addition you may be given an antibiotic to prevent or treat infection. Antibiotics are not always necessary. All medication should be taken as prescribed until the bottles are finished unless you are having an unusual reaction to one of the drugs.   [place], [place] [address] [phone]               AMBULATORY SURGERY  DISCHARGE INSTRUCTIONS   The drugs that you were given will stay in your  system until tomorrow so for the next 24 hours you should not:  Drive an automobile Make any legal decisions Drink any alcoholic beverage   You may resume regular meals tomorrow.  Today it is better to start with liquids and gradually work up to solid foods.  You may eat anything you prefer, but it is better to start with liquids, then soup and crackers, and gradually work up to solid foods.   Please notify your doctor immediately if you  have any unusual bleeding, trouble breathing, redness and pain at the surgery site, drainage, fever, or pain not relieved by medication.    Your post-operative visit with Dr.                                       is: Date:                        Time:    Please call to schedule your post-operative visit.  Additional Instructions:

## 2022-05-13 NOTE — Op Note (Signed)
Date of procedure: 05/13/22  Preoperative diagnosis:  Recurrent UTI/chronic bacterial colonization Right bladder wall erythema Hematuria  Postoperative diagnosis:  Same as above  Procedure: Bilateral retrograde pyelogram Left ureteral stent placement Bladder biopsy-right lateral bladder wall  Surgeon: Hollice Espy, MD  Anesthesia: General  Complications: None  Intraoperative findings: Somewhat cloudy urine and mildly friable nonspecific bladder erythema throughout however distinct almost nodular distinctly different erythematous patch on the right lateral bladder wall, approximately 1.5 cm in largest diameter.  Bilateral retrogrades with some distal J hooking bilaterally.  Small wire perforation left distal ureter and attempts for retrograde, stent left as a precaution.  EBL: Minimal  Specimens: Right lateral bladder wall bladder biopsy  Drains: 6 x 24 double-J ureteral stent on left  Indication: Kimberly Cook is a 76 y.o. patient with right bladder wall erythema in the setting of probable chronic bacterial colonization.  She was treated preoperatively for ESBL organism and appropriate antibiotics were ordered today preoperatively in the form of gentamicin..  After reviewing the management options for treatment, she elected to proceed with the above surgical procedure(s). We have discussed the potential benefits and risks of the procedure, side effects of the proposed treatment, the likelihood of the patient achieving the goals of the procedure, and any potential problems that might occur during the procedure or recuperation. Informed consent has been obtained.  Description of procedure:  The patient was taken to the operating room and general anesthesia was induced.  The patient was placed in the dorsal lithotomy position, prepped and draped in the usual sterile fashion, and preoperative antibiotics were administered. A preoperative time-out was performed.   A 21 French  scope was advanced per urethra into the bladder.  Notably, the bladder had some mild diffuse nonspecific erythema, initially cloudy urine with some debris.  It was irrigated several times to improve visualization.  This point time, distinctly different erythematous patch was appreciated on the right lateral bladder wall which had some adherent overlying clot was almost raised and firm in appearance and texture and significantly more erythematous than the surrounding tissues.  It measured approximately 1.5 cm in the largest diameter.  The remainder of the bladder in comparison was more normal.  First, attention was turned to the right ureteral orifice.  On scout imaging, a significant amount of spinal hardware was appreciated.  I first attempted to intubate the right UO with an open-ended ureteral catheter but due to some J hooking within the distal ureter, the contrast did not reflux up to the kidney.  I ended up using a wire and advanced the open-ended ureteral catheter into the proximal ureter in order to better inject the contrast solution.  This created a retrograde pyelogram indicating no hydroureteronephrosis or filling defects.  There was filling all around the open-ended ureteral catheter all the way down to the bladder which was otherwise unremarkable without filling defects.  The same procedure was performed on the left side however upon introducing the open-ended ureteral catheter and a sensor wire on this side to get the open-ended just within the UO, initial retrograde showed some extravasation which was felt to be from a small perforation from the wire.  I ended up using a angled Glidewire which ultimately was able to be advanced up the ureter all the way up to the level of the kidney and advanced the open-ended ureteral catheter to the proximal ureter.  The same procedure was performed on the side which showed no hydroureteronephrosis or filling defects on the side  and a normal column of contrast  along the length the ureter without filling defects or hydronephrosis.  Because of the small amount of contrast in the distal ureter, elected to go ahead and place a ureteral stent on the side especially in the setting of chronic bacterial colonization.  The sensor wire was replaced and the open-ended was removed.  Advanced a 6 x 24 French double-J ureteral stent all the way up to the renal pelvis.  Upon wire withdrawal, there was a full coil in the renal pelvis as well as in the bladder.  Next, cold cup biopsies were used to take approximately 4 biopsies from the right lateral bladder wall erythema.  Notably, this tissue was quite firm and almost nodular.  Bugbee electrocautery was then used to achieve careful and adequate hemostasis.  The remainder the bladder was then reinspected and no other areas were deemed pathologically abnormal and thus no additional biopsies were performed.  Her bladder was empty, the scope was removed, she was cleaned and dried, repositioned in the supine position, reversed of anesthesia and taken to the PACU in stable condition  Plan: Intraoperative findings and details were discussed with the patient's family.  They understand the precaution with a left ureteral stent placement we will plan to remove this in the office next week along with review pathology results and plan.  They are agreeable with this.  Vanna Scotland, M.D.

## 2022-05-13 NOTE — Anesthesia Preprocedure Evaluation (Signed)
Anesthesia Evaluation  Patient identified by MRN, date of birth, ID band Patient awake    Reviewed: Allergy & Precautions, NPO status , Patient's Chart, lab work & pertinent test results  History of Anesthesia Complications Negative for: history of anesthetic complications  Airway Mallampati: III  TM Distance: >3 FB Neck ROM: full    Dental  (+) Lower Dentures, Upper Dentures   Pulmonary neg pulmonary ROS, neg shortness of breath,    Pulmonary exam normal        Cardiovascular Exercise Tolerance: Good hypertension, + CAD  Normal cardiovascular exam     Neuro/Psych  Headaches, PSYCHIATRIC DISORDERS  Neuromuscular disease CVA    GI/Hepatic Neg liver ROS, hiatal hernia, GERD  Controlled,  Endo/Other  Hypothyroidism   Renal/GU Renal disease     Musculoskeletal   Abdominal   Peds  Hematology negative hematology ROS (+)   Anesthesia Other Findings Past Medical History: No date: Acute respiratory failure, unsp w hypoxia or hypercapnia  (HCC) No date: Allergy No date: Anxiety     Comment:  a.) on BZO (alprazolam) PRN No date: Aortic atherosclerosis (HCC) No date: Arthritis No date: Back pain No date: Chronic HFrEF (heart failure with reduced ejection fraction)  ([place])     Comment:  a. 08/2021 echo: EF 30-35%, glob HK, GrI DD; b. 10/2021               Echo: EF 30-35%. No date: CKD (chronic kidney disease), stage II No date: Coronary artery disease     Comment:  a. Mild to moderate CAD in LAD/diagonal by CTA (CT-FFR               of apical LAD 0.79); b. 08/2021 Cath: LM nl, LAD min               irregs, D1/2/3 nl, LCX nl, OM2/3 nl, RCA 20p, RPDA mild               dzs, RPAV nl. No date: DDD (degenerative disc disease), lumbar No date: Depression No date: Diverticulosis No date: Essential hypertension No date: GERD (gastroesophageal reflux disease) No date: Headache No date: Hiatal hernia No date: History of  shingles No date: HLD (hyperlipidemia) No date: Hypothyroidism No date: Lung nodule 2011: Mini stroke No date: Moderate Pericardial effusion     Comment:  a. 08/2021 Echo: moderate circumferential pericardial               effusion w/o tamponade; b. 10/2021 Echo: EF 30-35%, small               to mod circumferential pericardial effusion w/o               tamponade. No date: Muscle weakness No date: NICM (nonischemic cardiomyopathy) ([place])     Comment:  a. 08/2021 Echo: EF 30-35%, glob HK, GrI DD, nl RV fxn,               mild-mod dil LA, mod circumferential pericardial eff w/o               tamponade, Mod MR; b. 10/2021 Echo: EF 30-35%, glob HK. No date: Occipital neuralgia No date: On supplemental oxygen by nasal cannula     Comment:  a.) 2L.[place] at bedtime and PRN No date: Palpitations No date: Pancytopenia (HCC) No date: Pleural effusion on left     Comment:  a. 08/2021 s/p thoracentesis. 2018: Pneumonia No date: Polyneuropathy No date: Prediabetes No date: Renal cyst, left  Comment:  a.) Renal artery [place] 12/26/2017: measured 2.1 cm. No date: Sleep difficulties     Comment:  a.) takes trazodone + melatonin No date: Stroke [facility])     Comment:  MRI 04/2008 + left sup. frontal gyrus possibly puntate               infarct  2019: Syncope No date: Vitamin B deficiency No date: Vitamin D deficiency  Past Surgical History: No date: ABDOMINAL HYSTERECTOMY No date: BLADDER SURGERY     Comment:  2003 Over 20 years : BREAST EXCISIONAL BIOPSY; Right     Comment:  Benign No date: CHOLECYSTECTOMY No date: gastroplication  3419: KNEE ARTHROSCOPY; Left 01/31/2020: PULSE GENERATOR IMPLANT; Left     Comment:  Procedure: PLACEMENT RIGHT FLANK PULSE GENERATOR VS               REMOVAL SPINAL CORD STIMULATOR;  Surgeon: Deetta Perla,               MD;  Location: [facility] ORS;  Service: Neurosurgery;                Laterality: Left; 04/24/2020: PULSE GENERATOR IMPLANT; Left     Comment:  Procedure:  REPLACEMENT LEFT FLANK PULSE GENERATOR               IMPLANT;  Surgeon: Deetta Perla, MD;  Location: [facility] ORS;              Service: Neurosurgery;  Laterality: Left;  MAC w/ local No date: REPLACEMENT TOTAL KNEE; Left No date: right arm fracture 08/28/2021: RIGHT/LEFT HEART CATH AND CORONARY ANGIOGRAPHY; N/A     Comment:  Procedure: RIGHT/LEFT HEART CATH AND CORONARY               ANGIOGRAPHY;  Surgeon: Wellington Hampshire, MD;  Location:               [place] [facility];  Service: Cardiovascular;                Laterality: N/A; 06/26/2020: SPINAL CORD STIMULATOR REMOVAL; N/A     Comment:  Procedure: SPINAL CORD STIMULATOR REMOVAL;  Surgeon:               Deetta Perla, MD;  Location: [facility] ORS;  Service:               Neurosurgery;  Laterality: N/A; 01/24/2020: THORACIC LAMINECTOMY FOR SPINAL CORD STIMULATOR; N/A     Comment:  Procedure: THORACIC SPINAL CORD STIMULATOR PADDLE TRIAL               VIA LAMINECTOMY;  Surgeon: Deetta Perla, MD;  Location:               [facility] ORS;  Service: Neurosurgery;  Laterality: N/A; No date: TONSILLECTOMY AND ADENOIDECTOMY 04/17/2015: TOTAL KNEE ARTHROPLASTY; Left     Comment:  Procedure: LEFT TOTAL KNEE ARTHROPLASTY;  Surgeon:               Paralee Cancel, MD;  Location: [facility] OR[facility];  Service:               Orthopedics;  Laterality: Left;  BMI    Body Mass Index: 34.76 kg/m      Reproductive/Obstetrics negative OB ROS                             Anesthesia Physical Anesthesia Plan  ASA: 3  Anesthesia Plan: General  LMA   Post-op Pain Management:    Induction: Intravenous  PONV Risk Score and Plan: Dexamethasone, Ondansetron, Midazolam and Treatment may vary due to age or medical condition  Airway Management Planned: LMA  Additional Equipment:   Intra-op Plan:   Post-operative Plan: Extubation in OR  Informed Consent: I have reviewed the patients History and Physical, chart, labs and discussed the procedure including  the risks, benefits and alternatives for the proposed anesthesia with the patient or authorized representative who has indicated his/her understanding and acceptance.     Dental Advisory Given  Plan Discussed with: Anesthesiologist, CRNA and Surgeon  Anesthesia Plan Comments: (Patient consented for risks of anesthesia including but not limited to:  - adverse reactions to medications - damage to eyes, teeth, lips or other oral mucosa - nerve damage due to positioning  - sore throat or hoarseness - Damage to heart, brain, nerves, lungs, other parts of body or loss of life  Patient voiced understanding.)        Anesthesia Quick Evaluation

## 2022-05-13 NOTE — Anesthesia Procedure Notes (Signed)
Procedure Name: LMA Insertion Date/Time: 05/13/2022 11:58 AM  Performed by: Rolla Plate, CRNAPre-anesthesia Checklist: Patient identified, Patient being monitored, Timeout performed, Emergency Drugs available and Suction available Patient Re-evaluated:Patient Re-evaluated prior to induction Oxygen Delivery Method: Circle system utilized Preoxygenation: Pre-oxygenation with 100% oxygen Induction Type: IV induction Ventilation: Mask ventilation without difficulty LMA: LMA inserted LMA Size: 3.0 Tube type: Oral Number of attempts: 1 Placement Confirmation: positive ETCO2 and breath sounds checked- equal and bilateral Tube secured with: Tape Dental Injury: Teeth and Oropharynx as per pre-operative assessment

## 2022-05-14 ENCOUNTER — Encounter: Payer: Self-pay | Admitting: Urology

## 2022-05-14 ENCOUNTER — Inpatient Hospital Stay
Admission: EM | Admit: 2022-05-14 | Discharge: 2022-05-16 | DRG: 660 | Disposition: A | Discharge status: Skilled Nursing Facility | Payer: Medicare HMO | Attending: Family Medicine | Admitting: Family Medicine

## 2022-05-14 ENCOUNTER — Other Ambulatory Visit: Payer: Self-pay

## 2022-05-14 ENCOUNTER — Emergency Department: Payer: Medicare HMO

## 2022-05-14 ENCOUNTER — Ambulatory Visit: Payer: Medicare HMO | Admitting: Cardiology

## 2022-05-14 DIAGNOSIS — E538 Deficiency of other specified B group vitamins: Secondary | ICD-10-CM | POA: Diagnosis present

## 2022-05-14 DIAGNOSIS — Z20822 Contact with and (suspected) exposure to covid-19: Secondary | ICD-10-CM | POA: Diagnosis present

## 2022-05-14 DIAGNOSIS — N183 Chronic kidney disease, stage 3 unspecified: Secondary | ICD-10-CM | POA: Diagnosis present

## 2022-05-14 DIAGNOSIS — I10 Essential (primary) hypertension: Secondary | ICD-10-CM | POA: Diagnosis not present

## 2022-05-14 DIAGNOSIS — I959 Hypotension, unspecified: Secondary | ICD-10-CM | POA: Diagnosis present

## 2022-05-14 DIAGNOSIS — R112 Nausea with vomiting, unspecified: Secondary | ICD-10-CM | POA: Diagnosis not present

## 2022-05-14 DIAGNOSIS — F32A Depression, unspecified: Secondary | ICD-10-CM | POA: Diagnosis present

## 2022-05-14 DIAGNOSIS — N3289 Other specified disorders of bladder: Secondary | ICD-10-CM | POA: Diagnosis present

## 2022-05-14 DIAGNOSIS — Z90710 Acquired absence of both cervix and uterus: Secondary | ICD-10-CM

## 2022-05-14 DIAGNOSIS — R7989 Other specified abnormal findings of blood chemistry: Secondary | ICD-10-CM

## 2022-05-14 DIAGNOSIS — K219 Gastro-esophageal reflux disease without esophagitis: Secondary | ICD-10-CM | POA: Diagnosis present

## 2022-05-14 DIAGNOSIS — N3090 Cystitis, unspecified without hematuria: Secondary | ICD-10-CM | POA: Diagnosis present

## 2022-05-14 DIAGNOSIS — Z66 Do not resuscitate: Secondary | ICD-10-CM | POA: Diagnosis present

## 2022-05-14 DIAGNOSIS — Z79890 Hormone replacement therapy (postmenopausal): Secondary | ICD-10-CM

## 2022-05-14 DIAGNOSIS — E872 Acidosis, unspecified: Secondary | ICD-10-CM | POA: Diagnosis present

## 2022-05-14 DIAGNOSIS — R2689 Other abnormalities of gait and mobility: Secondary | ICD-10-CM | POA: Diagnosis not present

## 2022-05-14 DIAGNOSIS — M6281 Muscle weakness (generalized): Secondary | ICD-10-CM | POA: Diagnosis not present

## 2022-05-14 DIAGNOSIS — Z8249 Family history of ischemic heart disease and other diseases of the circulatory system: Secondary | ICD-10-CM

## 2022-05-14 DIAGNOSIS — I5022 Chronic systolic (congestive) heart failure: Secondary | ICD-10-CM | POA: Diagnosis present

## 2022-05-14 DIAGNOSIS — I5020 Unspecified systolic (congestive) heart failure: Secondary | ICD-10-CM | POA: Diagnosis present

## 2022-05-14 DIAGNOSIS — Z8673 Personal history of transient ischemic attack (TIA), and cerebral infarction without residual deficits: Secondary | ICD-10-CM

## 2022-05-14 DIAGNOSIS — N1831 Chronic kidney disease, stage 3a: Secondary | ICD-10-CM | POA: Diagnosis present

## 2022-05-14 DIAGNOSIS — N3001 Acute cystitis with hematuria: Principal | ICD-10-CM | POA: Diagnosis present

## 2022-05-14 DIAGNOSIS — M5136 Other intervertebral disc degeneration, lumbar region: Secondary | ICD-10-CM | POA: Diagnosis present

## 2022-05-14 DIAGNOSIS — Z87440 Personal history of urinary (tract) infections: Secondary | ICD-10-CM

## 2022-05-14 DIAGNOSIS — Z96642 Presence of left artificial hip joint: Secondary | ICD-10-CM | POA: Diagnosis present

## 2022-05-14 DIAGNOSIS — Z79899 Other long term (current) drug therapy: Secondary | ICD-10-CM

## 2022-05-14 DIAGNOSIS — E039 Hypothyroidism, unspecified: Secondary | ICD-10-CM | POA: Diagnosis present

## 2022-05-14 DIAGNOSIS — R262 Difficulty in walking, not elsewhere classified: Secondary | ICD-10-CM | POA: Diagnosis not present

## 2022-05-14 DIAGNOSIS — I2510 Atherosclerotic heart disease of native coronary artery without angina pectoris: Secondary | ICD-10-CM | POA: Diagnosis present

## 2022-05-14 DIAGNOSIS — Z7982 Long term (current) use of aspirin: Secondary | ICD-10-CM

## 2022-05-14 DIAGNOSIS — E785 Hyperlipidemia, unspecified: Secondary | ICD-10-CM | POA: Diagnosis present

## 2022-05-14 DIAGNOSIS — N3000 Acute cystitis without hematuria: Secondary | ICD-10-CM | POA: Diagnosis present

## 2022-05-14 DIAGNOSIS — R1110 Vomiting, unspecified: Secondary | ICD-10-CM

## 2022-05-14 DIAGNOSIS — I130 Hypertensive heart and chronic kidney disease with heart failure and stage 1 through stage 4 chronic kidney disease, or unspecified chronic kidney disease: Secondary | ICD-10-CM | POA: Diagnosis present

## 2022-05-14 DIAGNOSIS — E860 Dehydration: Secondary | ICD-10-CM | POA: Diagnosis present

## 2022-05-14 LAB — URINALYSIS, ROUTINE W REFLEX MICROSCOPIC
Bacteria, UA: NONE SEEN
Bilirubin Urine: NEGATIVE
Glucose, UA: NEGATIVE mg/dL
Ketones, ur: NEGATIVE mg/dL
Nitrite: NEGATIVE
Protein, ur: 100 mg/dL — AB
RBC / HPF: >50 RBC/hpf — ABNORMAL HIGH (ref 0–5)
Specific Gravity, Urine: 1.006 (ref 1.005–1.030)
pH: 7 (ref 5.0–8.0)

## 2022-05-14 LAB — COMPREHENSIVE METABOLIC PANEL
ALT: 18 U/L (ref 0–44)
AST: 34 U/L (ref 15–41)
Albumin: 4.6 g/dL (ref 3.5–5.0)
Alkaline Phosphatase: 68 U/L (ref 38–126)
Anion gap: 14 (ref 5–15)
BUN: 15 mg/dL (ref 8–23)
CO2: 24 mmol/L (ref 22–32)
Calcium: 9.3 mg/dL (ref 8.9–10.3)
Chloride: 98 mmol/L (ref 98–111)
Creatinine, Ser: 1 mg/dL (ref 0.44–1.00)
GFR, Estimated: 58 mL/min — ABNORMAL LOW (ref >60)
Glucose, Bld: 147 mg/dL — ABNORMAL HIGH (ref 70–99)
Potassium: 3.8 mmol/L (ref 3.5–5.1)
Sodium: 136 mmol/L (ref 135–145)
Total Bilirubin: 1 mg/dL (ref 0.3–1.2)
Total Protein: 8.5 g/dL — ABNORMAL HIGH (ref 6.5–8.1)

## 2022-05-14 LAB — CBC WITH DIFFERENTIAL/PLATELET
Abs Immature Granulocytes: 0.07 10*3/uL (ref 0.00–0.07)
Basophils Absolute: 0 10*3/uL (ref 0.0–0.1)
Basophils Relative: 0 %
Eosinophils Absolute: 0 10*3/uL (ref 0.0–0.5)
Eosinophils Relative: 0 %
HCT: 42.1 % (ref 36.0–46.0)
Hemoglobin: 14.2 g/dL (ref 12.0–15.0)
Immature Granulocytes: 1 %
Lymphocytes Relative: 8 %
Lymphs Abs: 0.7 10*3/uL (ref 0.7–4.0)
MCH: 30.5 pg (ref 26.0–34.0)
MCHC: 33.7 g/dL (ref 30.0–36.0)
MCV: 90.3 fL (ref 80.0–100.0)
Monocytes Absolute: 0.5 10*3/uL (ref 0.1–1.0)
Monocytes Relative: 6 %
Neutro Abs: 7.2 10*3/uL (ref 1.7–7.7)
Neutrophils Relative %: 85 %
Platelets: 168 10*3/uL (ref 150–400)
RBC: 4.66 MIL/uL (ref 3.87–5.11)
RDW: 13 % (ref 11.5–15.5)
WBC: 8.4 10*3/uL (ref 4.0–10.5)
nRBC: 0 % (ref 0.0–0.2)

## 2022-05-14 LAB — LACTIC ACID, PLASMA: Lactic Acid, Venous: 2.4 mmol/L (ref 0.5–1.9)

## 2022-05-14 LAB — LIPASE, BLOOD: Lipase: 22 U/L (ref 11–51)

## 2022-05-14 LAB — TROPONIN I (HIGH SENSITIVITY)
Troponin I (High Sensitivity): 38 ng/L — ABNORMAL HIGH (ref <18)
Troponin I (High Sensitivity): 58 ng/L — ABNORMAL HIGH (ref <18)

## 2022-05-14 LAB — SARS CORONAVIRUS 2 BY RT PCR: SARS Coronavirus 2 by RT PCR: NEGATIVE

## 2022-05-14 LAB — SURGICAL PATHOLOGY

## 2022-05-14 MED ORDER — ONDANSETRON HCL 4 MG PO TABS
4.0000 mg | ORAL_TABLET | Freq: Four times a day (QID) | ORAL | Status: DC | PRN
  Administered 2022-05-15: 4 mg via ORAL
  Filled 2022-05-14: qty 1

## 2022-05-14 MED ORDER — ACETAMINOPHEN 325 MG PO TABS
650.0000 mg | ORAL_TABLET | Freq: Four times a day (QID) | ORAL | Status: DC | PRN
  Administered 2022-05-15 (×2): 650 mg via ORAL
  Filled 2022-05-14 (×2): qty 2

## 2022-05-14 MED ORDER — ASPIRIN 81 MG PO TBEC
81.0000 mg | DELAYED_RELEASE_TABLET | Freq: Every day | ORAL | Status: DC
  Administered 2022-05-14 – 2022-05-15 (×2): 81 mg via ORAL
  Filled 2022-05-14 (×2): qty 1

## 2022-05-14 MED ORDER — SODIUM CHLORIDE 0.9 % IV SOLN
1.0000 g | Freq: Once | INTRAVENOUS | Status: AC
  Administered 2022-05-14: 1 g via INTRAVENOUS
  Filled 2022-05-14: qty 20

## 2022-05-14 MED ORDER — ACETAMINOPHEN 650 MG RE SUPP: 650.0000 mg | Freq: Four times a day (QID) | RECTAL | Status: DC | PRN

## 2022-05-14 MED ORDER — POLYETHYLENE GLYCOL 3350 17 G PO PACK: 17.0000 g | PACK | Freq: Every day | ORAL | Status: DC | PRN

## 2022-05-14 MED ORDER — MIDODRINE HCL 5 MG PO TABS: 10.0000 mg | ORAL_TABLET | Freq: Three times a day (TID) | ORAL | Status: DC

## 2022-05-14 MED ORDER — SODIUM CHLORIDE 0.9 % IV BOLUS
1000.0000 mL | Freq: Once | INTRAVENOUS | Status: AC
  Administered 2022-05-14: 1000 mL via INTRAVENOUS

## 2022-05-14 MED ORDER — PANTOPRAZOLE SODIUM 40 MG PO TBEC
40.0000 mg | DELAYED_RELEASE_TABLET | Freq: Every day | ORAL | Status: DC
  Administered 2022-05-15 – 2022-05-16 (×2): 40 mg via ORAL
  Filled 2022-05-14 (×2): qty 1

## 2022-05-14 MED ORDER — HYDROCODONE-ACETAMINOPHEN 5-325 MG PO TABS
1.0000 | ORAL_TABLET | Freq: Four times a day (QID) | ORAL | Status: DC | PRN
  Administered 2022-05-15: 1 via ORAL
  Filled 2022-05-14: qty 1

## 2022-05-14 MED ORDER — PREGABALIN 75 MG PO CAPS
75.0000 mg | ORAL_CAPSULE | Freq: Three times a day (TID) | ORAL | Status: DC
  Administered 2022-05-14 – 2022-05-16 (×4): 75 mg via ORAL
  Filled 2022-05-14 (×4): qty 1

## 2022-05-14 MED ORDER — SODIUM CHLORIDE 0.9 % IV SOLN
1.0000 g | Freq: Two times a day (BID) | INTRAVENOUS | Status: DC
  Administered 2022-05-15 – 2022-05-16 (×3): 1 g via INTRAVENOUS
  Filled 2022-05-14 (×4): qty 20

## 2022-05-14 MED ORDER — ENOXAPARIN SODIUM 40 MG/0.4ML IJ SOSY
40.0000 mg | PREFILLED_SYRINGE | INTRAMUSCULAR | Status: DC
  Administered 2022-05-14 – 2022-05-15 (×2): 40 mg via SUBCUTANEOUS
  Filled 2022-05-14 (×2): qty 0.4

## 2022-05-14 MED ORDER — CITALOPRAM HYDROBROMIDE 10 MG PO TABS
20.0000 mg | ORAL_TABLET | Freq: Every day | ORAL | Status: DC
  Administered 2022-05-14 – 2022-05-16 (×3): 20 mg via ORAL
  Filled 2022-05-14: qty 2
  Filled 2022-05-14 (×2): qty 1

## 2022-05-14 MED ORDER — SODIUM CHLORIDE 0.9 % IV SOLN: 2.0000 g | Freq: Once | INTRAVENOUS | Status: DC

## 2022-05-14 MED ORDER — ATORVASTATIN CALCIUM 20 MG PO TABS
20.0000 mg | ORAL_TABLET | Freq: Every evening | ORAL | Status: DC
  Administered 2022-05-14 – 2022-05-15 (×2): 20 mg via ORAL
  Filled 2022-05-14 (×2): qty 1

## 2022-05-14 MED ORDER — TRAZODONE HCL 100 MG PO TABS
100.0000 mg | ORAL_TABLET | Freq: Every day | ORAL | Status: DC
  Administered 2022-05-14 – 2022-05-15 (×2): 100 mg via ORAL
  Filled 2022-05-14 (×2): qty 1

## 2022-05-14 MED ORDER — LEVOTHYROXINE SODIUM 50 MCG PO TABS
100.0000 ug | ORAL_TABLET | Freq: Every day | ORAL | Status: DC
  Administered 2022-05-15 – 2022-05-16 (×2): 100 ug via ORAL
  Filled 2022-05-14: qty 1
  Filled 2022-05-14: qty 2

## 2022-05-14 MED ORDER — IOHEXOL 300 MG/ML  SOLN
100.0000 mL | Freq: Once | INTRAMUSCULAR | Status: AC | PRN
  Administered 2022-05-14: 100 mL via INTRAVENOUS

## 2022-05-14 MED ORDER — MONTELUKAST SODIUM 10 MG PO TABS
10.0000 mg | ORAL_TABLET | Freq: Every day | ORAL | Status: DC
  Administered 2022-05-14 – 2022-05-15 (×2): 10 mg via ORAL
  Filled 2022-05-14 (×2): qty 1

## 2022-05-14 MED ORDER — ALPRAZOLAM 0.5 MG PO TABS
0.5000 mg | ORAL_TABLET | Freq: Two times a day (BID) | ORAL | Status: DC | PRN
  Filled 2022-05-14: qty 1

## 2022-05-14 MED ORDER — ONDANSETRON HCL 4 MG/2ML IJ SOLN
4.0000 mg | Freq: Four times a day (QID) | INTRAMUSCULAR | Status: DC | PRN
  Administered 2022-05-15 – 2022-05-16 (×2): 4 mg via INTRAVENOUS
  Filled 2022-05-14 (×2): qty 2

## 2022-05-14 NOTE — Assessment & Plan Note (Signed)
Patient presenting for UTI with emesis.  Troponin was checked in the ED and elevated at 38.  Patient denies any chest pain at this time and EKG is without any acute changes.  Potentially in the setting of severe vomiting.  We will recheck a second troponin and if it remains elevated or rising, will consult cardiology.  - Trend troponin - Stat EKG for any chest pain

## 2022-05-14 NOTE — Assessment & Plan Note (Signed)
Patient presenting with 1 day history of new onset urinary burning and increased urinary frequency in the setting of recent bladder biopsy.  Pathology results demonstrated inflammation but no evidence of malignancy.  Preop urine cultures with ESBL for which she was pretreated with Macrobid and gentamicin.  - Urology consulted; appreciate their recommendations - Urine cultures pending - Continue meropenem for ESBL coverage

## 2022-05-14 NOTE — Assessment & Plan Note (Signed)
-   Patient is hypertensive at this time.  We will hold her midodrine for today and restart tomorrow.

## 2022-05-14 NOTE — ED Notes (Signed)
Date and time results received: 05/14/22 1630  Test: Lactic Critical Value: 2.4  Name of Provider Notified: Dr. Ellender Hose

## 2022-05-14 NOTE — Assessment & Plan Note (Signed)
Patient presenting with 24 hours of nonbloody emesis that began after her recent bladder biopsy.  Etiology includes sedation related emesis versus secondary to UTI.  We will treat conservatively with Zofran and she has received IV hydration in the ED.  -Continue Zofran every 6 hours as needed

## 2022-05-14 NOTE — Assessment & Plan Note (Signed)
Continue home levothyroxine 

## 2022-05-14 NOTE — H&P (Signed)
History and Physical    Patient: Kimberly Cook SRP:[id number] DOB: 1946-04-17 DOA: 05/14/2022 DOS: the patient was seen and examined on 05/14/2022 PCP: System, Provider Not In  Patient coming from: SNF.  [employer].  Chief Complaint: Nausea, vomiting, urinary burning  HPI: Kimberly Cook is a 76 y.o. female with medical history significant of prior ESBL UTI, HFrEF with EF of 30-35%, CVA, CKD stage IIIa, hypertension, hypothyroidism, who presents to the ED with nausea, vomiting and urinary burning.  [name] states she has a history of recurrent UTIs.  She was most recently hospitalized 9/7 to 9/13 for syncope secondary to UTI for which she was treated with 5 days of Bactrim. During hospitalization, she underwent flexible cystoscopy with Dr. Erlene Quan that revealed bladder wall thickening with bladder erythema.  On 10/9, she underwent bladder biopsy; preop antibiotics included Macrobid.  [name] states that after her biopsy, she experienced persistent nausea with non-bloody emesis that was intractable.  In addition, she developed urinary burning with increased urinary frequency.  She also endorses suprapubic tenderness and chills with generalized weakness but denies any fevers, diarrhea, congestion, chest pain, shortness of breath, bowel movement changes  ED course: On arrival to the ED, patient was afebrile at 99.1.  She was hypertensive at 158/97 with heart rate of 98.  She was saturating at 100% on room air.  Initial work-up remarkable for urinalysis with large hemoglobin, small leukocytes, negative nitrites, over 50 RBCs and 21-50 WBCs.  Due to need to cover for ESBL UTI, [facility] consulted for admission.  Review of Systems: As mentioned in the history of present illness. All other systems reviewed and are negative.  Past Medical History:  Diagnosis Date   Acute respiratory failure, unsp w hypoxia or hypercapnia (HCC)    Allergy    Anxiety    a.) on BZO (alprazolam) PRN    Aortic atherosclerosis (HCC)    Arthritis    Back pain    Chronic HFrEF (heart failure with reduced ejection fraction) ([place])    a. 08/2021 echo: EF 30-35%, glob HK, GrI DD; b. 10/2021 Echo: EF 30-35%.   CKD (chronic kidney disease), stage II    Coronary artery disease    a. Mild to moderate CAD in LAD/diagonal by CTA (CT-FFR of apical LAD 0.79); b. 08/2021 Cath: LM nl, LAD min irregs, D1/2/3 nl, LCX nl, OM2/3 nl, RCA 20p, RPDA mild dzs, RPAV nl.   DDD (degenerative disc disease), lumbar    Depression    Diverticulosis    Essential hypertension    GERD (gastroesophageal reflux disease)    Headache    Hiatal hernia    History of shingles    HLD (hyperlipidemia)    Hypothyroidism    Lung nodule    Mini stroke 2011   Moderate Pericardial effusion    a. 08/2021 Echo: moderate circumferential pericardial effusion w/o tamponade; b. 10/2021 Echo: EF 30-35%, small to mod circumferential pericardial effusion w/o tamponade.   Muscle weakness    NICM (nonischemic cardiomyopathy) ([place])    a. 08/2021 Echo: EF 30-35%, glob HK, GrI DD, nl RV fxn, mild-mod dil LA, mod circumferential pericardial eff w/o tamponade, Mod MR; b. 10/2021 Echo: EF 30-35%, glob HK.   Occipital neuralgia    On supplemental oxygen by nasal cannula    a.) 2L.[place] at bedtime and PRN   Palpitations    Pancytopenia (HCC)    Pleural effusion on left    a. 08/2021 s/p thoracentesis.   Pneumonia 2018  Polyneuropathy    Prediabetes    Renal cyst, left    a.) Renal artery [place] 12/26/2017: measured 2.1 cm.   Sleep difficulties    a.) takes trazodone + melatonin   Stroke ([place])    MRI 04/2008 + left sup. frontal gyrus possibly puntate infarct    Syncope 2019   Vitamin B deficiency    Vitamin D deficiency    Past Surgical History:  Procedure Laterality Date   ABDOMINAL HYSTERECTOMY     BLADDER SURGERY     2003   BREAST EXCISIONAL BIOPSY Right Over 20 years    Benign   CHOLECYSTECTOMY     CYSTOSCOPY W/ URETERAL STENT PLACEMENT Left  05/13/2022   Procedure: CYSTOSCOPY WITH RETROGRADE PYELOGRAM/URETERAL LEFT STENT PLACEMENT;  Surgeon: Hollice Espy, MD;  Location: [facility];  Service: Urology;  Laterality: Left;   CYSTOSCOPY WITH BIOPSY N/A 05/13/2022   Procedure: CYSTOSCOPY WITH BLADDER BIOPSY;  Surgeon: Hollice Espy, MD;  Location: [facility];  Service: Urology;  Laterality: N/A;   gastroplication      KNEE ARTHROSCOPY Left 2011   PULSE GENERATOR IMPLANT Left 01/31/2020   Procedure: PLACEMENT RIGHT FLANK PULSE GENERATOR VS REMOVAL SPINAL CORD STIMULATOR;  Surgeon: Deetta Perla, MD;  Location: [facility];  Service: Neurosurgery;  Laterality: Left;   PULSE GENERATOR IMPLANT Left 04/24/2020   Procedure: REPLACEMENT LEFT FLANK PULSE GENERATOR IMPLANT;  Surgeon: Deetta Perla, MD;  Location: [facility];  Service: Neurosurgery;  Laterality: Left;  MAC w/ local   REPLACEMENT TOTAL KNEE Left    right arm fracture     RIGHT/LEFT HEART CATH AND CORONARY ANGIOGRAPHY N/A 08/28/2021   Procedure: RIGHT/LEFT HEART CATH AND CORONARY ANGIOGRAPHY;  Surgeon: Wellington Hampshire, MD;  Location: [facility];  Service: Cardiovascular;  Laterality: N/A;   SPINAL CORD STIMULATOR REMOVAL N/A 06/26/2020   Procedure: SPINAL CORD STIMULATOR REMOVAL;  Surgeon: Deetta Perla, MD;  Location: [facility];  Service: Neurosurgery;  Laterality: N/A;   THORACIC LAMINECTOMY FOR SPINAL CORD STIMULATOR N/A 01/24/2020   Procedure: THORACIC SPINAL CORD STIMULATOR PADDLE TRIAL VIA LAMINECTOMY;  Surgeon: Deetta Perla, MD;  Location: [facility];  Service: Neurosurgery;  Laterality: N/A;   TONSILLECTOMY AND ADENOIDECTOMY     TOTAL KNEE ARTHROPLASTY Left 04/17/2015   Procedure: LEFT TOTAL KNEE ARTHROPLASTY;  Surgeon: Paralee Cancel, MD;  Location: [facility];  Service: Orthopedics;  Laterality: Left;   Social History:  reports that she has never smoked. She has been exposed to tobacco smoke. She has never used smokeless tobacco. She reports that she does not drink alcohol and  does not use drugs.  No Known Allergies  Family History  Problem Relation Age of Onset   Heart disease Mother    Hypertension Mother    Diabetes Mother    Heart attack Mother 28   Heart disease Father    Heart attack Father 42   Breast cancer Maternal Aunt    Heart attack Brother    Prior to Admission medications   Medication Sig Start Date End Date Taking? Authorizing Provider  ALPRAZolam Duanne Moron) 0.5 MG tablet Take 1 tablet (0.5 mg total) by mouth 2 (two) times daily as needed for anxiety. 03/06/22  Yes Wieting, Richard, MD  ascorbic acid (VITAMIN C) 500 MG tablet Take 500 mg by mouth daily.   Yes [provider]  aspirin EC 81 MG tablet Take 1 tablet (81 mg total) by mouth at bedtime. Swallow whole. 10/25/20  Yes McLean-Scocuzza, Nino Glow, MD  atorvastatin (LIPITOR)  20 MG tablet Take 1 tablet (20 mg total) by mouth daily. 06/20/20  Yes McLean-Scocuzza, Nino Glow, MD  Biotin 1 MG CAPS Take 1,000 mcg by mouth daily.   Yes [provider]  cetirizine (ZYRTEC) 5 MG tablet Take 5 mg by mouth daily.   Yes [provider]  Cholecalciferol (VITAMIN D3) 125 MCG (5000 UT) TABS Take 1 tablet (5,000 Units total) by mouth daily. 10/09/18  Yes McLean-Scocuzza, Nino Glow, MD  citalopram (CELEXA) 20 MG tablet Take 20 mg by mouth daily. 01/10/22  Yes [provider]  Cranberry 450 MG TABS Take 1 tablet by mouth every morning. 01/03/22  Yes [provider]  folic acid (FOLVITE) 119 MCG tablet Take 1 tablet (400 mcg total) by mouth daily. SEPARATE ALL SUPPLEMENTS TO LUNCH OR DINNER AND PRILOSEC NOT TO MESS W/THYROID MED 12/10/18  Yes McLean-Scocuzza, Nino Glow, MD  HYDROcodone-acetaminophen (NORCO/VICODIN) 5-325 MG tablet Take 1 tablet by mouth every 6 (six) hours as needed for moderate pain. 05/13/22  Yes Hollice Espy, MD  levothyroxine (SYNTHROID) 100 MCG tablet Take 1 tablet (100 mcg total) by mouth daily before breakfast. Patient taking differently: Take 100 mcg by mouth  daily. At bedtime 03/06/22  Yes Wieting, Richard, MD  magnesium oxide (MAG-OX) 400 (240 Mg) MG tablet Take 1 tablet (400 mg total) by mouth daily. 08/31/21  Yes Nicole Kindred A, DO  melatonin 5 MG TABS Take 1 tablet (5 mg total) by mouth at bedtime. 04/04/21  Yes Val Riles, MD  midodrine (PROAMATINE) 10 MG tablet Take 1 tablet (10 mg total) by mouth 3 (three) times daily with meals. 04/17/22  Yes Wouk, Ailene Rud, MD  montelukast (SINGULAIR) 10 MG tablet Take 1 tablet (10 mg total) by mouth at bedtime. 02/14/20  Yes McLean-Scocuzza, Nino Glow, MD  Multiple Vitamin (MULTIVITAMIN WITH MINERALS) TABS tablet Take 1 tablet by mouth daily.   Yes [provider]  nitrofurantoin, macrocrystal-monohydrate, (MACROBID) 100 MG capsule Take 1 capsule (100 mg total) by mouth 2 (two) times daily. 05/08/22  Yes Hollice Espy, MD  omeprazole (PRILOSEC) 40 MG capsule Take 1 capsule (40 mg total) by mouth daily. After lunch 06/23/20  Yes McLean-Scocuzza, Nino Glow, MD  ondansetron (ZOFRAN-ODT) 4 MG disintegrating tablet Take 4 mg by mouth every 8 (eight) hours as needed for nausea or vomiting.   Yes [provider]  potassium chloride SA (KLOR-CON M) 20 MEQ tablet Take 1 tablet (20 mEq total) by mouth 2 (two) times daily. 08/30/21  Yes Nicole Kindred A, DO  pregabalin (LYRICA) 75 MG capsule Take 75 mg by mouth 3 (three) times daily.   Yes [provider]  torsemide (DEMADEX) 20 MG tablet Take 20 mg by mouth daily.   Yes [provider]  traZODone (DESYREL) 100 MG tablet Take 100 mg by mouth at bedtime. 12/13/21  Yes [provider]  vitamin B-12 (CYANOCOBALAMIN) 1000 MCG tablet Take 1 tablet (1,000 mcg total) by mouth daily. 10/09/18  Yes McLean-Scocuzza, Nino Glow, MD  White Petrolatum-Mineral Oil (ARTIFICIAL TEARS) ointment Place 1 drop into both eyes daily. At bedtime   Yes [provider]  acetaminophen (TYLENOL) 500 MG tablet Take 1,000 mg by mouth every 8 (eight)  hours as needed for moderate pain.    [provider]  alendronate (FOSAMAX) 70 MG tablet Take 70 mg by mouth once a week.  10/23/19   [provider]  Carboxymethylcellulose Sodium (REFRESH LIQUIGEL) 1 % GEL Place 1 drop into both eyes at  bedtime.    [provider]  hydrocortisone cream 1 % Apply 1 Application topically 2 (two) times daily as needed for itching.    [provider]  oxybutynin (DITROPAN) 5 MG tablet Take 1 tablet (5 mg total) by mouth every 8 (eight) hours as needed for bladder spasms. 05/13/22   Hollice Espy, MD  polyethylene glycol powder (GLYCOLAX/MIRALAX) 17 GM/SCOOP powder Take 17 g by mouth daily as needed for moderate constipation. 10/21/19   McLean-Scocuzza, Nino Glow, MD    Physical Exam: Vitals:   05/14/22 1800 05/14/22 1830 05/14/22 1900 05/14/22 1930  BP: (!) 165/88 (!) 159/129 (!) 162/101 (!) 160/85  Pulse: 83 82 81 89  Resp: 17 16 (!) 23 17  Temp:    98.3 F (36.8 C)  TempSrc:    Oral  SpO2: 100% 100% 99% 96%   Physical Exam Vitals and nursing note reviewed.  Constitutional:      General: She is not in acute distress.    Appearance: She is obese.  HENT:     Head: Normocephalic and atraumatic.     Mouth/Throat:     Mouth: Mucous membranes are moist.     Pharynx: Oropharynx is clear.  Eyes:     Conjunctiva/sclera: Conjunctivae normal.     Pupils: Pupils are equal, round, and reactive to light.  Cardiovascular:     Rate and Rhythm: Normal rate and regular rhythm.     Heart sounds: No murmur heard. Pulmonary:     Effort: Pulmonary effort is normal. No respiratory distress.     Breath sounds: Normal breath sounds. No wheezing or rales.  Abdominal:     General: Bowel sounds are normal.     Palpations: Abdomen is soft.     Tenderness: There is abdominal tenderness in the suprapubic area. There is no guarding.  Musculoskeletal:     Cervical back: Neck supple.     Right lower leg: No edema.     Left lower leg: No  edema.  Skin:    General: Skin is warm and dry.  Neurological:     General: No focal deficit present.     Mental Status: She is alert and oriented to person, place, and time.  Psychiatric:        Mood and Affect: Mood normal.        Behavior: Behavior normal.    Data Reviewed: CT C is completely unremarkable.CMP remarkable for glucose of 147 with GFR 58, creatinine 1.0.  Lactic acid elevated at 2.4.  Troponin elevated at 38.  Urinalysis demonstrates large hemoglobin with 100 protein, small leukocytes, over 50 RBC/hpf, 21-50 WBCs/hpf.  CT abdomen/pelvis with left ureteral calculus in satisfactory position.  Asymmetric wall thickening along the right lateral bladder wall, unchanged.  Chest x-ray personally reviewed; no interstitial opacities or pleural effusions noted.  EKG personally reviewed; sinus rhythm with no acute ST or T wave changes.  There are no new results to review at this time.  Assessment and Plan: * Acute cystitis Patient presenting with 1 day history of new onset urinary burning and increased urinary frequency in the setting of recent bladder biopsy.  Pathology results demonstrated inflammation but no evidence of malignancy.  Preop urine cultures with ESBL for which she was pretreated with Macrobid and gentamicin.  - Urology consulted; appreciate their recommendations - Urine cultures pending - Continue meropenem for ESBL coverage  Emesis Patient presenting with 24 hours of nonbloody emesis that began after her recent bladder biopsy.  Etiology includes  sedation related emesis versus secondary to UTI.  We will treat conservatively with Zofran and she has received IV hydration in the ED.  -Continue Zofran every 6 hours as needed  Elevated troponin Patient presenting for UTI with emesis.  Troponin was checked in the ED and elevated at 38.  Patient denies any chest pain at this time and EKG is without any acute changes.  Potentially in the setting of severe vomiting.   We will recheck a second troponin and if it remains elevated or rising, will consult cardiology.  - Trend troponin - Stat EKG for any chest pain  HFrEF (heart failure with reduced ejection fraction) ([place]) Patient appears euvolemic at this time.  Given ongoing vomiting, will hold home torsemide to prevent dehydration.  -Restart home torsemide when able  CKD Stage IIIa Creatinine at baseline at this time.  -Repeat BMP in the a.m.  Depression - Continue home Xanax and Lexapro  Hypothyroidism - Continue home levothyroxine  Hypotension - Patient is hypertensive at this time.  We will hold her midodrine for today and restart tomorrow.   Advance Care Planning:   Code Status: DNR.  Discussed with patient and she confirmed her wishes to be DNR.  She states she has informed her family of this decision.  Consults: Urology  Family Communication: Patient's friend updated at bedside  Severity of Illness: The appropriate patient status for this patient is OBSERVATION. Observation status is judged to be reasonable and necessary in order to provide the required intensity of service to ensure the patient's safety. The patient's presenting symptoms, physical exam findings, and initial radiographic and laboratory data in the context of their medical condition is felt to place them at decreased risk for further clinical deterioration. Furthermore, it is anticipated that the patient will be medically stable for discharge from the hospital within 2 midnights of admission.   Author: Jose Persia, MD 05/14/2022 8:54 PM  For on call review [email or web address].

## 2022-05-14 NOTE — Consult Note (Signed)
PHARMACY -  BRIEF ANTIBIOTIC NOTE   Pharmacy has received consult(s) for meropenem from an ED provider.  The patient's profile has been reviewed for ht/wt/allergies/indication/available labs.   Suspected urinary tract infection. History of ESBL E coli in urine.   One time order(s) placed for  --Meropenem 1 g IV  Further antibiotics/pharmacy consults should be ordered by admitting physician if indicated.                       Thank you, Benita Gutter 05/14/2022  5:49 PM

## 2022-05-14 NOTE — Assessment & Plan Note (Signed)
Creatinine at baseline at this time.  -Repeat BMP in the a.m.

## 2022-05-14 NOTE — ED Provider Notes (Addendum)
[facility] Provider Note    Event Date/Time   First MD Initiated Contact with Patient 05/14/22 1540     (approximate)   History   Abdominal Pain   HPI  Kimberly Cook is a 76 y.o. female  with PMHx HFrEF, CKD, CAD, recent biopsy for erythematous bladder ulceration here with abdominal pain, nausea, vomiting, fatigue. Pt reports that for the past 2-3 days she has had worsening general fatigue, nausea, and vomiting. She has had "aches everywhere" and states she hurts from her head down to her legs. Denies any significant focal area of pain. She had a cysto and bx done yesterday, has had worsening nausea, vomiting, and diffuse body pain since then. She endorses chills, weakness. No blood in her urine or stools that she is aware of.       Physical Exam   Triage Vital Signs: ED Triage Vitals [05/14/22 1544]  Enc Vitals Group     BP (!) 158/97     Pulse Rate 97     Resp 18     Temp 99.1 F (37.3 C)     Temp Source Oral     SpO2 100 %     Weight      Height      Head Circumference      Peak Flow      Pain Score 10     Pain Loc      Pain Edu?      Excl. in [place]?     Most recent vital signs: Vitals:   05/14/22 1544  BP: (!) 158/97  Pulse: 97  Resp: 18  Temp: 99.1 F (37.3 C)  SpO2: 100%     General: Awake, no distress.  CV:  Good peripheral perfusion. RRR. Intermittent tachycardia. Resp:  Normal effort. Mild tachypnea but lungs CTAB. Abd:  No distention. Moderate suprapubic TTP. Other:  No LE edema. No focal neuro deficits.   ED Results / Procedures / Treatments   Labs (all labs ordered are listed, but only abnormal results are displayed) Labs Reviewed  COMPREHENSIVE METABOLIC PANEL - Abnormal; Notable for the following components:      Result Value   Glucose, Bld 147 (*)    Total Protein 8.5 (*)    GFR, Estimated 58 (*)    All other components within normal limits  URINALYSIS, ROUTINE W REFLEX MICROSCOPIC - Abnormal; Notable  for the following components:   Color, Urine YELLOW (*)    APPearance HAZY (*)    Hgb urine dipstick LARGE (*)    Protein, ur 100 (*)    Leukocytes,Ua SMALL (*)    RBC / HPF >50 (*)    All other components within normal limits  LACTIC ACID, PLASMA - Abnormal; Notable for the following components:   Lactic Acid, Venous 2.4 (*)    All other components within normal limits  TROPONIN I (HIGH SENSITIVITY) - Abnormal; Notable for the following components:   Troponin I (High Sensitivity) 38 (*)    All other components within normal limits  SARS CORONAVIRUS 2 BY RT PCR  URINE CULTURE  CULTURE, BLOOD (SINGLE)  CULTURE, BLOOD (SINGLE)  CBC WITH DIFFERENTIAL/PLATELET  LIPASE, BLOOD  LACTIC ACID, PLASMA     EKG Normal sinus rhythm, trickle rate 97.  PR 149, QRS 85, QTc 501.  Prolonged QT, no acute ST elevations or depressions.   RADIOLOGY Chest x-ray: No active disease CT abdomen/pelvis: Ureteral stent in place, mild fullness left renal pelvis, right  lateral bladder wall thickening is unchanged, no free air   I also independently reviewed and agree with radiologist interpretations.   PROCEDURES:  Critical Care performed: No  .1-3 Lead EKG Interpretation  Performed by: Shaune Pollack, MD Authorized by: Shaune Pollack, MD     Interpretation: non-specific     ECG rate:  90-110   ECG rate assessment: normal     Rhythm: sinus tachycardia     Ectopy: none     Conduction: normal   Comments:     Indication: Generalized weakness    MEDICATIONS ORDERED IN ED: Medications  sodium chloride 0.9 % bolus 1,000 mL (has no administration in time range)  sodium chloride 0.9 % bolus 1,000 mL (1,000 mLs Intravenous New Bag/Given 05/14/22 1553)  iohexol (OMNIPAQUE) 300 MG/ML solution 100 mL (100 mLs Intravenous Contrast Given 05/14/22 1654)     IMPRESSION / MDM / ASSESSMENT AND PLAN / ED COURSE  I reviewed the triage vital signs and the nursing notes.                                The patient is on the cardiac monitor to evaluate for evidence of arrhythmia and/or significant heart rate changes.   Ddx:  Differential includes the following, with pertinent life- or limb-threatening emergencies considered:  UTI, obstruction, postop complication, ileus, anesthesia effect, medication effect, polypharmacy, atypical angina/ACS  Patient's presentation is most consistent with acute presentation with potential threat to life or bodily function.  MDM:  76 year old female with complex past medical history including recent biopsy for bladder ulceration here with generalized weakness, nausea, and vomiting as well as diffuse body pain.  Patient tachycardic but afebrile.  CBC shows no leukocytosis.  CMP is unremarkable.  Lactic acid elevated 2.4 and the patient has had vomiting.  Troponin 38 which is suspect is demand related.  EKG is nonischemic and unremarkable other than prolonged QTc.  CT of the abdomen and pelvis obtained shows no evidence of surgical complication or abnormality.  Chest x-ray is clear.  Urinalysis does show pyuria and hematuria.  Patient did just have a urinary procedure as well.  Will cover for possible UTI given her lactic acidosis and vomiting with tachycardia.  Patient has a history of ESBL so will cover meropenem.  She is hemodynamically stable otherwise.  Admitted to hospitalist.  They requested that I speak with urology.  Urology paged, spoke with Dr. Lonna Cobb who agrees w/ plan for antibiotics, will let Dr.Brandon know pt will be admitted.  MEDICATIONS GIVEN IN ED: Medications  sodium chloride 0.9 % bolus 1,000 mL (has no administration in time range)  sodium chloride 0.9 % bolus 1,000 mL (1,000 mLs Intravenous New Bag/Given 05/14/22 1553)  iohexol (OMNIPAQUE) 300 MG/ML solution 100 mL (100 mLs Intravenous Contrast Given 05/14/22 1654)     Consults:  Hospitalist   EMR reviewed  Reviewed op note from Dr. Apolinar Junes     FINAL CLINICAL  IMPRESSION(S) / ED DIAGNOSES   Final diagnoses:  Acute cystitis with hematuria  Dehydration     Rx / DC Orders   ED Discharge Orders     None        Note:  This document was prepared using Dragon voice recognition software and may include unintentional dictation errors.   Shaune Pollack, MD 05/14/22 Clelia Croft    Shaune Pollack, MD 05/14/22 [phone]

## 2022-05-14 NOTE — Progress Notes (Deleted)
Cardiology Clinic Note   Patient Name: EMBRY MANRIQUE Date of Encounter: 05/14/2022  Primary Care Provider:  System, Provider Not In Primary Cardiologist:  Nelva Bush, MD  Patient Profile    76 year old female with history of nonobstructive CAD, hypertension, hyperlipidemia, hypothyroidism, stroke, GERD, CKD stage II, depression and anxiety, presents for follow-up today related to HFrEF (EF 45-50%).  Past Medical History    Past Medical History:  Diagnosis Date   Acute respiratory failure, unsp w hypoxia or hypercapnia (HCC)    Allergy    Anxiety    a.) on BZO (alprazolam) PRN   Aortic atherosclerosis (HCC)    Arthritis    Back pain    Chronic HFrEF (heart failure with reduced ejection fraction) ([place])    a. 08/2021 echo: EF 30-35%, glob HK, GrI DD; b. 10/2021 Echo: EF 30-35%.   CKD (chronic kidney disease), stage II    Coronary artery disease    a. Mild to moderate CAD in LAD/diagonal by CTA (CT-FFR of apical LAD 0.79); b. 08/2021 Cath: LM nl, LAD min irregs, D1/2/3 nl, LCX nl, OM2/3 nl, RCA 20p, RPDA mild dzs, RPAV nl.   DDD (degenerative disc disease), lumbar    Depression    Diverticulosis    Essential hypertension    GERD (gastroesophageal reflux disease)    Headache    Hiatal hernia    History of shingles    HLD (hyperlipidemia)    Hypothyroidism    Lung nodule    Mini stroke 2011   Moderate Pericardial effusion    a. 08/2021 Echo: moderate circumferential pericardial effusion w/o tamponade; b. 10/2021 Echo: EF 30-35%, small to mod circumferential pericardial effusion w/o tamponade.   Muscle weakness    NICM (nonischemic cardiomyopathy) ([place])    a. 08/2021 Echo: EF 30-35%, glob HK, GrI DD, nl RV fxn, mild-mod dil LA, mod circumferential pericardial eff w/o tamponade, Mod MR; b. 10/2021 Echo: EF 30-35%, glob HK.   Occipital neuralgia    On supplemental oxygen by nasal cannula    a.) 2L.[place] at bedtime and PRN   Palpitations    Pancytopenia (HCC)    Pleural  effusion on left    a. 08/2021 s/p thoracentesis.   Pneumonia 2018   Polyneuropathy    Prediabetes    Renal cyst, left    a.) Renal artery [place] 12/26/2017: measured 2.1 cm.   Sleep difficulties    a.) takes trazodone + melatonin   Stroke ([place])    MRI 04/2008 + left sup. frontal gyrus possibly puntate infarct    Syncope 2019   Vitamin B deficiency    Vitamin D deficiency    Past Surgical History:  Procedure Laterality Date   ABDOMINAL HYSTERECTOMY     BLADDER SURGERY     2003   BREAST EXCISIONAL BIOPSY Right Over 20 years    Benign   CHOLECYSTECTOMY     CYSTOSCOPY W/ URETERAL STENT PLACEMENT Left 05/13/2022   Procedure: CYSTOSCOPY WITH RETROGRADE PYELOGRAM/URETERAL LEFT STENT PLACEMENT;  Surgeon: Hollice Espy, MD;  Location: [facility] ORS;  Service: Urology;  Laterality: Left;   CYSTOSCOPY WITH BIOPSY N/A 05/13/2022   Procedure: CYSTOSCOPY WITH BLADDER BIOPSY;  Surgeon: Hollice Espy, MD;  Location: [facility] ORS;  Service: Urology;  Laterality: N/A;   gastroplication      KNEE ARTHROSCOPY Left 2011   PULSE GENERATOR IMPLANT Left 01/31/2020   Procedure: PLACEMENT RIGHT FLANK PULSE GENERATOR VS REMOVAL SPINAL CORD STIMULATOR;  Surgeon: Deetta Perla, MD;  Location: [facility]  ORS;  Service: Neurosurgery;  Laterality: Left;   PULSE GENERATOR IMPLANT Left 04/24/2020   Procedure: REPLACEMENT LEFT FLANK PULSE GENERATOR IMPLANT;  Surgeon: Deetta Perla, MD;  Location: [facility] ORS;  Service: Neurosurgery;  Laterality: Left;  MAC w/ local   REPLACEMENT TOTAL KNEE Left    right arm fracture     RIGHT/LEFT HEART CATH AND CORONARY ANGIOGRAPHY N/A 08/28/2021   Procedure: RIGHT/LEFT HEART CATH AND CORONARY ANGIOGRAPHY;  Surgeon: Wellington Hampshire, MD;  Location: [place] [facility];  Service: Cardiovascular;  Laterality: N/A;   SPINAL CORD STIMULATOR REMOVAL N/A 06/26/2020   Procedure: SPINAL CORD STIMULATOR REMOVAL;  Surgeon: Deetta Perla, MD;  Location: [facility] ORS;  Service: Neurosurgery;  Laterality: N/A;    THORACIC LAMINECTOMY FOR SPINAL CORD STIMULATOR N/A 01/24/2020   Procedure: THORACIC SPINAL CORD STIMULATOR PADDLE TRIAL VIA LAMINECTOMY;  Surgeon: Deetta Perla, MD;  Location: [facility] ORS;  Service: Neurosurgery;  Laterality: N/A;   TONSILLECTOMY AND ADENOIDECTOMY     TOTAL KNEE ARTHROPLASTY Left 04/17/2015   Procedure: LEFT TOTAL KNEE ARTHROPLASTY;  Surgeon: Paralee Cancel, MD;  Location: [facility] ORS;  Service: Orthopedics;  Laterality: Left;    Allergies  No Known Allergies  History of Present Illness    (D. Strohecker is a 76 year old female with past medical history including nonobstructive CAD, hypertension, hyperlipidemia, hypothyroidism, CKD 2, stroke, GERD, depression anxiety.    Prior echocardiogram in August 2018 revealed EF 50-55%, G1 DD.  Coronary CTA in September 2018 showed mild obstructive CAD involving the proximal LAD and second diagonal.  She subsequently had low risk Myoview in February 2022 in the setting of chest discomfort that was intermittent.  She was admitted to [facility] in late January 2023 with worsening dyspnea and ankle swelling.  She was also noted to have moderate bilateral pleural effusion.  Repeated echocardiogram showed EF of 30-35%, global hypokinesis, moderate mitral regurgitation, and moderate central pericardial effusion without tamponade.  Diagnostic catheterization showed mild proximal RCA disease with otherwise relatively normal coronary arteries.  She continued to be medically managed.  She did require thoracentesis of the left pleural effusion with 500 mL of fluid removed.  GDMT was escalated and she was subsequently discharged to skilled nursing facility on August 30, 2021.    She followed in clinic 10/17/2021 with improving minimal leg edema and resolution of shortness of breath.  She continued to follow-up with heart failure clinic with [facility] with minimal shortness of breath with moderate exertion and medication changes that were made but noted  weight increase of 14 pounds 1 month earlier.    She presented to the emergency department on 01/12/2022 for chest pain.  Vitals were normal, D-dimer was elevated, CTA of the chest was negative for PE and other acute findings, showed chronic hiatal hernia, DVT ultrasound was negative, high-sensitivity troponins negative x2 and BNP was within normal limits.  Chest leukocytosis.  EKG showed some QTc prolongation which improved she was considered for her chest pain was musculoskeletal and she was recommended for outpatient follow-up.  She was last seen in clinic on 8/23 with a cardiac standpoint was overall doing well.  There were no changes to her medications that were made and she was continue with her routine follow-up.  10/9/ 23 patient bilateral retrograde pyelogram, left ureteral stent placement, bladder biopsy the right lateral bladder wall.  She did have recurrent UTIs and antibiotic therapy.  She returns clinic today  Home Medications    Current Outpatient Medications  Medication Sig Dispense Refill  acetaminophen (TYLENOL) 500 MG tablet Take 1,000 mg by mouth every 8 (eight) hours as needed for moderate pain.     alendronate (FOSAMAX) 70 MG tablet Take 70 mg by mouth once a week.      ALPRAZolam (XANAX) 0.5 MG tablet Take 1 tablet (0.5 mg total) by mouth 2 (two) times daily as needed for anxiety. 5 tablet 0   ascorbic acid (VITAMIN C) 500 MG tablet Take 500 mg by mouth daily.     aspirin EC 81 MG tablet Take 1 tablet (81 mg total) by mouth at bedtime. Swallow whole. 90 tablet 3   atorvastatin (LIPITOR) 20 MG tablet Take 1 tablet (20 mg total) by mouth daily. 90 tablet 3   Biotin 1 MG CAPS Take 1,000 mcg by mouth daily.     Carboxymethylcellulose Sodium (REFRESH LIQUIGEL) 1 % GEL Place 1 drop into both eyes at bedtime.     cetirizine (ZYRTEC) 5 MG tablet Take 5 mg by mouth daily.     Cholecalciferol (VITAMIN D3) 125 MCG (5000 UT) TABS Take 1 tablet (5,000 Units total) by mouth daily. 90  tablet 3   citalopram (CELEXA) 20 MG tablet Take 20 mg by mouth daily.     Cranberry 450 MG TABS Take 1 tablet by mouth every morning.     folic acid (FOLVITE) 193 MCG tablet Take 1 tablet (400 mcg total) by mouth daily. SEPARATE ALL SUPPLEMENTS TO LUNCH OR DINNER AND PRILOSEC NOT TO MESS W/THYROID MED 90 tablet 3   HYDROcodone-acetaminophen (NORCO/VICODIN) 5-325 MG tablet Take 1 tablet by mouth every 6 (six) hours as needed for moderate pain. 10 tablet 0   hydrocortisone cream 1 % Apply 1 Application topically 2 (two) times daily as needed for itching.     levothyroxine (SYNTHROID) 100 MCG tablet Take 1 tablet (100 mcg total) by mouth daily before breakfast. (Patient taking differently: Take 100 mcg by mouth daily. At bedtime) 30 tablet 0   magnesium oxide (MAG-OX) 400 (240 Mg) MG tablet Take 1 tablet (400 mg total) by mouth daily.     melatonin 5 MG TABS Take 1 tablet (5 mg total) by mouth at bedtime.  0   midodrine (PROAMATINE) 10 MG tablet Take 1 tablet (10 mg total) by mouth 3 (three) times daily with meals. 60 tablet 1   montelukast (SINGULAIR) 10 MG tablet Take 1 tablet (10 mg total) by mouth at bedtime. 90 tablet 3   Multiple Vitamin (MULTIVITAMIN WITH MINERALS) TABS tablet Take 1 tablet by mouth daily.     nitrofurantoin, macrocrystal-monohydrate, (MACROBID) 100 MG capsule Take 1 capsule (100 mg total) by mouth 2 (two) times daily. 20 capsule 0   omeprazole (PRILOSEC) 40 MG capsule Take 1 capsule (40 mg total) by mouth daily. After lunch 90 capsule 3   oxybutynin (DITROPAN) 5 MG tablet Take 1 tablet (5 mg total) by mouth every 8 (eight) hours as needed for bladder spasms. 30 tablet 0   polyethylene glycol powder (GLYCOLAX/MIRALAX) 17 GM/SCOOP powder Take 17 g by mouth daily as needed for moderate constipation. 255 g 11   potassium chloride SA (KLOR-CON M) 20 MEQ tablet Take 1 tablet (20 mEq total) by mouth 2 (two) times daily.     pregabalin (LYRICA) 75 MG capsule Take 75 mg by mouth 3  (three) times daily.     traZODone (DESYREL) 100 MG tablet Take 100 mg by mouth at bedtime.     vitamin B-12 (CYANOCOBALAMIN) 1000 MCG tablet Take 1 tablet (1,000 mcg  total) by mouth daily. 90 tablet 3   White Petrolatum-Mineral Oil (ARTIFICIAL TEARS) ointment Place 1 drop into both eyes daily. At bedtime     No current facility-administered medications for this visit.     Family History    Family History  Problem Relation Age of Onset   Heart disease Mother    Hypertension Mother    Diabetes Mother    Heart attack Mother 42   Heart disease Father    Heart attack Father 47   Breast cancer Maternal Aunt    Heart attack Brother    She indicated that her mother is deceased. She indicated that her father is deceased. She indicated that her brother is deceased. She indicated that her maternal aunt is deceased.  Social History    Social History   Socioeconomic History   Marital status: Widowed    Spouse name: Not on file   Number of children: Not on file   Years of education: Not on file   Highest education level: Not on file  Occupational History   Not on file  Tobacco Use   Smoking status: Never    Passive exposure: Yes   Smokeless tobacco: Never   Tobacco comments:    husbands and children smoked in home.   Vaping Use   Vaping Use: Never used  Substance and Sexual Activity   Alcohol use: No   Drug use: No   Sexual activity: Never  Other Topics Concern   Not on file  Social History Narrative   Widowed    Lives at [facility]   Has kids and grandson    Social Determinants of Health   Financial Resource Strain: Low Risk  (07/24/2020)   Overall Financial Resource Strain (CARDIA)    Difficulty of Paying Living Expenses: Not hard at all  Food Insecurity: No Food Insecurity (04/12/2022)   Hunger Vital Sign    Worried About Running Out of Food in the Last Year: Never true    Ran Out of Food in the Last Year: Never true  Transportation Needs: No Transportation  Needs (04/12/2022)   PRAPARE - Hydrologist (Medical): No    Lack of Transportation (Non-Medical): No  Physical Activity: Unknown (07/24/2020)   Exercise Vital Sign    Days of Exercise per Week: 0 days    Minutes of Exercise per Session: Not on file  Stress: No Stress Concern Present (07/24/2020)   [place]    Feeling of Stress : Only a little  Social Connections: Unknown (07/24/2020)   Social Connection and Isolation Panel [NHANES]    Frequency of Communication with Friends and Family: More than three times a week    Frequency of Social Gatherings with Friends and Family: More than three times a week    Attends Religious Services: Not on file    Active Member of Clubs or Organizations: Not on file    Attends Archivist Meetings: Not on file    Marital Status: Not on file  Intimate Partner Violence: Not At Risk (04/12/2022)   Humiliation, Afraid, Rape, and Kick questionnaire    Fear of Current or Ex-Partner: No    Emotionally Abused: No    Physically Abused: No    Sexually Abused: No     Review of Systems    General:  No chills, fever, night sweats or weight changes.  Cardiovascular:  No chest pain, dyspnea on exertion,  edema, orthopnea, palpitations, paroxysmal nocturnal dyspnea. Dermatological: No rash, lesions/masses Respiratory: No cough, dyspnea Urologic: No hematuria, dysuria Abdominal:   No nausea, vomiting, diarrhea, bright red blood per rectum, melena, or hematemesis Neurologic:  No visual changes, wkns, changes in mental status. All other systems reviewed and are otherwise negative except as noted above.     Physical Exam    VS:  There were no vitals taken for this visit. , BMI There is no height or weight on file to calculate BMI.     GEN: Well nourished, well developed, in no acute distress. HEENT: normal. Neck: Supple, no JVD, carotid bruits, or  masses. Cardiac: RRR, no murmurs, rubs, or gallops. No clubbing, cyanosis, edema.  Radials/DP/PT 2+ and equal bilaterally.  Respiratory:  Respirations regular and unlabored, clear to auscultation bilaterally. GI: Soft, nontender, nondistended, BS + x 4. MS: no deformity or atrophy. Skin: warm and dry, no rash. Neuro:  Strength and sensation are intact. Psych: Normal affect.  Accessory Clinical Findings    ECG personally reviewed by me today- *** - No acute changes  Lab Results  Component Value Date   WBC 4.6 04/14/2022   HGB 10.5 (L) 04/14/2022   HCT 31.6 (L) 04/14/2022   MCV 93.2 04/14/2022   PLT 151 04/14/2022   Lab Results  Component Value Date   CREATININE 0.92 04/16/2022   BUN 31 (H) 04/16/2022   NA 140 04/16/2022   K 4.5 04/16/2022   CL 107 04/16/2022   CO2 27 04/16/2022   Lab Results  Component Value Date   ALT 16 04/11/2022   AST 23 04/11/2022   ALKPHOS 74 04/11/2022   BILITOT 0.7 04/11/2022   Lab Results  Component Value Date   CHOL 125 11/24/2019   HDL 46.40 11/24/2019   LDLCALC 47 11/24/2019   TRIG 158.0 (H) 11/24/2019   CHOLHDL 3 11/24/2019    Lab Results  Component Value Date   HGBA1C 5.1 11/24/2019    Assessment & Plan   1.  ***  Miley Blanchett, NP 05/14/2022, 8:13 AM

## 2022-05-14 NOTE — ED Triage Notes (Signed)
Post biopsy yesterday. Vomiting over night. EMS gave 4mg  Zofran. EMS found pt to be tachy and tachypnic. Afebrile.

## 2022-05-14 NOTE — Assessment & Plan Note (Signed)
Continue home Xanax and Lexapro °

## 2022-05-14 NOTE — Telephone Encounter (Signed)
Just got this message, not sure why it wasn't clear.  Script was sent in post op and its in the chart.  Please clarify with [facility].  Hollice Espy, MD

## 2022-05-14 NOTE — Telephone Encounter (Signed)
Copy of script sent to [facility] at [phone].

## 2022-05-14 NOTE — Progress Notes (Signed)
Pharmacy Antibiotic Note  Kimberly Cook is a 76 y.o. female admitted on 05/14/2022 with UTI.  Pharmacy has been consulted for Meropenem dosing. Patient has a history of ESBL E.coli UTI in the past and has had recent bladder biopsy.  Plan: Meropenem 1g IV q12h     Temp (24hrs), Avg:98.9 F (37.2 C), Min:98.3 F (36.8 C), Max:99.4 F (37.4 C)  Recent Labs  Lab 05/14/22 1551  WBC 8.4  CREATININE 1.00  LATICACIDVEN 2.4*    Estimated Creatinine Clearance: 48 mL/min (by C-G formula based on SCr of 1 mg/dL).    No Known Allergies  Antimicrobials this admission: Meropenem 10/10 >>     Dose adjustments this admission:  Microbiology results:   Thank you for allowing pharmacy to be a part of this patient's care.  Paulina Fusi, PharmD, BCPS 05/14/2022 9:21 PM

## 2022-05-14 NOTE — Assessment & Plan Note (Signed)
Patient appears euvolemic at this time.  Given ongoing vomiting, will hold home torsemide to prevent dehydration.  -Restart home torsemide when able

## 2022-05-15 DIAGNOSIS — Z7982 Long term (current) use of aspirin: Secondary | ICD-10-CM | POA: Diagnosis not present

## 2022-05-15 DIAGNOSIS — N3289 Other specified disorders of bladder: Secondary | ICD-10-CM | POA: Diagnosis present

## 2022-05-15 DIAGNOSIS — Z66 Do not resuscitate: Secondary | ICD-10-CM | POA: Diagnosis present

## 2022-05-15 DIAGNOSIS — I5022 Chronic systolic (congestive) heart failure: Secondary | ICD-10-CM | POA: Diagnosis present

## 2022-05-15 DIAGNOSIS — N3001 Acute cystitis with hematuria: Secondary | ICD-10-CM | POA: Diagnosis present

## 2022-05-15 DIAGNOSIS — Z8673 Personal history of transient ischemic attack (TIA), and cerebral infarction without residual deficits: Secondary | ICD-10-CM | POA: Diagnosis not present

## 2022-05-15 DIAGNOSIS — F321 Major depressive disorder, single episode, moderate: Secondary | ICD-10-CM | POA: Diagnosis not present

## 2022-05-15 DIAGNOSIS — E039 Hypothyroidism, unspecified: Secondary | ICD-10-CM | POA: Diagnosis present

## 2022-05-15 DIAGNOSIS — I2510 Atherosclerotic heart disease of native coronary artery without angina pectoris: Secondary | ICD-10-CM | POA: Diagnosis present

## 2022-05-15 DIAGNOSIS — N3090 Cystitis, unspecified without hematuria: Secondary | ICD-10-CM

## 2022-05-15 DIAGNOSIS — N3000 Acute cystitis without hematuria: Secondary | ICD-10-CM | POA: Diagnosis present

## 2022-05-15 DIAGNOSIS — E785 Hyperlipidemia, unspecified: Secondary | ICD-10-CM | POA: Diagnosis present

## 2022-05-15 DIAGNOSIS — E872 Acidosis, unspecified: Secondary | ICD-10-CM | POA: Diagnosis present

## 2022-05-15 DIAGNOSIS — N1831 Chronic kidney disease, stage 3a: Secondary | ICD-10-CM | POA: Diagnosis present

## 2022-05-15 DIAGNOSIS — R112 Nausea with vomiting, unspecified: Secondary | ICD-10-CM

## 2022-05-15 DIAGNOSIS — E860 Dehydration: Secondary | ICD-10-CM | POA: Diagnosis present

## 2022-05-15 DIAGNOSIS — F32A Depression, unspecified: Secondary | ICD-10-CM | POA: Diagnosis present

## 2022-05-15 DIAGNOSIS — Z20822 Contact with and (suspected) exposure to covid-19: Secondary | ICD-10-CM | POA: Diagnosis present

## 2022-05-15 DIAGNOSIS — Z96642 Presence of left artificial hip joint: Secondary | ICD-10-CM | POA: Diagnosis present

## 2022-05-15 DIAGNOSIS — Z87440 Personal history of urinary (tract) infections: Secondary | ICD-10-CM | POA: Diagnosis not present

## 2022-05-15 DIAGNOSIS — Z79899 Other long term (current) drug therapy: Secondary | ICD-10-CM | POA: Diagnosis not present

## 2022-05-15 DIAGNOSIS — K219 Gastro-esophageal reflux disease without esophagitis: Secondary | ICD-10-CM | POA: Diagnosis present

## 2022-05-15 DIAGNOSIS — R7989 Other specified abnormal findings of blood chemistry: Secondary | ICD-10-CM | POA: Diagnosis not present

## 2022-05-15 DIAGNOSIS — I130 Hypertensive heart and chronic kidney disease with heart failure and stage 1 through stage 4 chronic kidney disease, or unspecified chronic kidney disease: Secondary | ICD-10-CM | POA: Diagnosis present

## 2022-05-15 DIAGNOSIS — Z8249 Family history of ischemic heart disease and other diseases of the circulatory system: Secondary | ICD-10-CM | POA: Diagnosis not present

## 2022-05-15 DIAGNOSIS — M5136 Other intervertebral disc degeneration, lumbar region: Secondary | ICD-10-CM | POA: Diagnosis present

## 2022-05-15 DIAGNOSIS — Z90710 Acquired absence of both cervix and uterus: Secondary | ICD-10-CM | POA: Diagnosis not present

## 2022-05-15 DIAGNOSIS — E538 Deficiency of other specified B group vitamins: Secondary | ICD-10-CM | POA: Diagnosis present

## 2022-05-15 DIAGNOSIS — I959 Hypotension, unspecified: Secondary | ICD-10-CM | POA: Diagnosis present

## 2022-05-15 LAB — CBC
HCT: 37.2 % (ref 36.0–46.0)
Hemoglobin: 12.7 g/dL (ref 12.0–15.0)
MCH: 31.1 pg (ref 26.0–34.0)
MCHC: 34.1 g/dL (ref 30.0–36.0)
MCV: 91 fL (ref 80.0–100.0)
Platelets: 147 10*3/uL — ABNORMAL LOW (ref 150–400)
RBC: 4.09 MIL/uL (ref 3.87–5.11)
RDW: 13.2 % (ref 11.5–15.5)
WBC: 8.7 10*3/uL (ref 4.0–10.5)
nRBC: 0 % (ref 0.0–0.2)

## 2022-05-15 LAB — TROPONIN I (HIGH SENSITIVITY)
Troponin I (High Sensitivity): 55 ng/L — ABNORMAL HIGH (ref <18)
Troponin I (High Sensitivity): 39 ng/L — ABNORMAL HIGH (ref <18)

## 2022-05-15 LAB — BASIC METABOLIC PANEL
Anion gap: 10 (ref 5–15)
BUN: 16 mg/dL (ref 8–23)
CO2: 23 mmol/L (ref 22–32)
Calcium: 8.4 mg/dL — ABNORMAL LOW (ref 8.9–10.3)
Chloride: 103 mmol/L (ref 98–111)
Creatinine, Ser: 1.01 mg/dL — ABNORMAL HIGH (ref 0.44–1.00)
GFR, Estimated: 57 mL/min — ABNORMAL LOW (ref >60)
Glucose, Bld: 136 mg/dL — ABNORMAL HIGH (ref 70–99)
Potassium: 3.5 mmol/L (ref 3.5–5.1)
Sodium: 136 mmol/L (ref 135–145)

## 2022-05-15 LAB — LACTIC ACID, PLASMA
Lactic Acid, Venous: 0.8 mmol/L (ref 0.5–1.9)
Lactic Acid, Venous: 1 mmol/L (ref 0.5–1.9)

## 2022-05-15 LAB — URINE CULTURE: Culture: NO GROWTH

## 2022-05-15 MED ORDER — LACTATED RINGERS IV SOLN: INTRAVENOUS | Status: AC

## 2022-05-15 MED ORDER — ALUM & MAG HYDROXIDE-SIMETH 200-200-20 MG/5ML PO SUSP
30.0000 mL | Freq: Four times a day (QID) | ORAL | Status: DC | PRN
  Administered 2022-05-15 – 2022-05-16 (×3): 30 mL via ORAL
  Filled 2022-05-15 (×3): qty 30

## 2022-05-15 NOTE — Progress Notes (Signed)
[facility]  PROGRESS NOTE  Kimberly Cook VPX:[id number] DOB: 1946/06/06 DOA: 05/14/2022 PCP: System, Provider Not In   Brief HPI:   76 year old female with medical history of prior ESBL UTI, HFrEF with EF 30 to 35%, CVA, CKD stage IIIa, hypertension, hypothyroidism who presented to the ED with nausea vomiting and urinary burning. hospitalized 9/7 to 9/13 for syncope secondary to UTI for which she was treated with 5 days of Bactrim. During hospitalization, she underwent flexible cystoscopy with Dr. Erlene Cook that revealed bladder wall thickening with bladder erythema.  On 10/9, she underwent bladder biopsy; preop antibiotics included Macrobid.  In the ED UA showed large hemoglobin, 50 RBCs, 21-50 WBCs.  Patient started on meropenem to cover ESBL.  Subjective   Patient complains of nausea and vomiting.   Assessment/Plan:     Acute cystitis -Presents with 1 day history of dysuria with increased frequency of urination in setting of recent bladder biopsy -Path report showed inflammation but no evidence of malignancy -Preop culture grew ESBL for which she was pretreated with Macrobid and gentamicin -Urology consulted -Continue meropenem for ESBL coverage -Follow urine culture results  Vomiting -Patient presented with 24 hours of nonbloody emesis after recent bladder biopsy -Likely due to UTI as above -Continue Zofran as needed  Troponin elevation -Presented with mild elevation of troponin at 55, trending down to 39 -Chest tightness today, EKG shows no acute changes  -Patient has been vomiting, likely from acid reflux    HFrEF -Patient is euvolemic -Hold torsemide to prevent dehydration  CKD stage III -Creatinine at baseline  Depression -Continue Xanax, Lexapro  Hypothyroidism -Continue levothyroxine  History of hypotension -Patient takes midodrine for hypotension -Blood pressure is high, will hold midodrine at this time    Medications     aspirin EC  81  mg Oral QHS   atorvastatin  20 mg Oral QPM   citalopram  20 mg Oral Daily   enoxaparin (LOVENOX) injection  40 mg Subcutaneous Q24H   levothyroxine  100 mcg Oral Q0600   midodrine  10 mg Oral TID WC   montelukast  10 mg Oral QHS   pantoprazole  40 mg Oral Daily   pregabalin  75 mg Oral TID   traZODone  100 mg Oral QHS     Data Reviewed:   CBG:  No results for input(s): "GLUCAP" in the last 168 hours.  SpO2: 96 %    Vitals:   05/15/22 0430 05/15/22 0547 05/15/22 0700 05/15/22 0724  BP: (!) 155/85 (!) 140/92  133/83  Pulse: 100 (!) 105 (!) 103 (!) 109  Resp: 18 18    Temp:      TempSrc:      SpO2: 95% 95% 95% 96%      Data Reviewed:  Basic Metabolic Panel: Recent Labs  Lab 05/14/22 1551 05/15/22 0233  NA 136 136  K 3.8 3.5  CL 98 103  CO2 24 23  GLUCOSE 147* 136*  BUN 15 16  CREATININE 1.00 1.01*  CALCIUM 9.3 8.4*    CBC: Recent Labs  Lab 05/14/22 1551 05/15/22 0447  WBC 8.4 8.7  NEUTROABS 7.2  --   HGB 14.2 12.7  HCT 42.1 37.2  MCV 90.3 91.0  PLT 168 147*    LFT Recent Labs  Lab 05/14/22 1551  AST 34  ALT 18  ALKPHOS 68  BILITOT 1.0  PROT 8.5*  ALBUMIN 4.6     Antibiotics: Anti-infectives (From admission, onward)    Start  Dose/Rate Route Frequency Ordered Stop   05/15/22 0600  meropenem (MERREM) 1 g in sodium chloride 0.9 % 100 mL IVPB        1 g 200 mL/hr over 30 Minutes Intravenous Every 12 hours 05/14/22 2118     05/14/22 1800  meropenem (MERREM) 1 g in sodium chloride 0.9 % 100 mL IVPB        1 g 200 mL/hr over 30 Minutes Intravenous  Once 05/14/22 1749 05/14/22 1939   05/14/22 1715  cefTRIAXone (ROCEPHIN) 2 g in sodium chloride 0.9 % 100 mL IVPB  Status:  Discontinued        2 g 200 mL/hr over 30 Minutes Intravenous  Once 05/14/22 1704 05/14/22 1729        DVT prophylaxis: Lovenox  Code Status: DNR  Family Communication:    CONSULTS urology   Objective    Physical Examination:  General-appears  lethargic Heart-S1-S2, regular Lungs-clear to auscultation bilaterally Abdomen-soft, nontender, organomegaly Extremities-no edema in the lower extremities    Status is: Inpatient:             Kimberly Cook   [facility] If 7PM-7AM, please contact night-coverage at [email or web address], Office  [phone]   05/15/2022, 7:56 AM  LOS: 0 days

## 2022-05-15 NOTE — Progress Notes (Signed)
PT Cancellation Note  Patient Details Name: Kimberly Cook MRN: [id number] DOB: 28-Feb-1946   Cancelled Treatment:    Reason Eval/Treat Not Completed: Fatigue/lethargy limiting ability to participate (Pt just got to the floor from ED not long ago, reports to still feel quite awful. Pt here for UTI. Will defer evaluation to next day for medical optimization.)  4:26 PM, 05/15/22 Etta Grandchild, PT, DPT Physical Therapist - [facility]  [phone] ([place])    [name] 05/15/2022, 4:26 PM

## 2022-05-15 NOTE — Plan of Care (Signed)

## 2022-05-15 NOTE — ED Notes (Signed)
Patient reports she has a headache.   Pain medication given per prn orders.  Purewick had leaked, linen changed and pericare provided.  Patient tolerated well and was able to assist with her care.

## 2022-05-15 NOTE — ED Notes (Signed)
Pt reports central chest pain, not sure if it is heartburn. Meds given (see MAR) and MD notified. Will continue to monitor and address as ordered by provider.

## 2022-05-15 NOTE — Plan of Care (Signed)

## 2022-05-16 DIAGNOSIS — E860 Dehydration: Secondary | ICD-10-CM | POA: Diagnosis not present

## 2022-05-16 DIAGNOSIS — E039 Hypothyroidism, unspecified: Secondary | ICD-10-CM

## 2022-05-16 DIAGNOSIS — N1831 Chronic kidney disease, stage 3a: Secondary | ICD-10-CM | POA: Diagnosis not present

## 2022-05-16 DIAGNOSIS — N3001 Acute cystitis with hematuria: Secondary | ICD-10-CM | POA: Diagnosis not present

## 2022-05-16 DIAGNOSIS — F321 Major depressive disorder, single episode, moderate: Secondary | ICD-10-CM | POA: Diagnosis not present

## 2022-05-16 LAB — BASIC METABOLIC PANEL
Anion gap: 8 (ref 5–15)
BUN: 18 mg/dL (ref 8–23)
CO2: 24 mmol/L (ref 22–32)
Calcium: 8.5 mg/dL — ABNORMAL LOW (ref 8.9–10.3)
Chloride: 107 mmol/L (ref 98–111)
Creatinine, Ser: 0.88 mg/dL (ref 0.44–1.00)
GFR, Estimated: >60 mL/min (ref >60)
Glucose, Bld: 98 mg/dL (ref 70–99)
Potassium: 3.4 mmol/L — ABNORMAL LOW (ref 3.5–5.1)
Sodium: 139 mmol/L (ref 135–145)

## 2022-05-16 LAB — CBC
HCT: 36.3 % (ref 36.0–46.0)
Hemoglobin: 12.3 g/dL (ref 12.0–15.0)
MCH: 31.5 pg (ref 26.0–34.0)
MCHC: 33.9 g/dL (ref 30.0–36.0)
MCV: 92.8 fL (ref 80.0–100.0)
Platelets: 143 10*3/uL — ABNORMAL LOW (ref 150–400)
RBC: 3.91 MIL/uL (ref 3.87–5.11)
RDW: 13 % (ref 11.5–15.5)
WBC: 6.1 10*3/uL (ref 4.0–10.5)
nRBC: 0 % (ref 0.0–0.2)

## 2022-05-16 MED ORDER — POTASSIUM CHLORIDE CRYS ER 20 MEQ PO TBCR
40.0000 meq | EXTENDED_RELEASE_TABLET | Freq: Once | ORAL | Status: AC
  Administered 2022-05-16: 40 meq via ORAL
  Filled 2022-05-16: qty 2

## 2022-05-16 MED ORDER — ONDANSETRON HCL 4 MG PO TABS: 4.0000 mg | ORAL_TABLET | Freq: Three times a day (TID) | ORAL | 0 refills | Status: DC | PRN

## 2022-05-16 NOTE — TOC Progression Note (Addendum)
Transition of Care [facility]) - Progression Note    Patient Details  Name: Kimberly Cook MRN: [id number] Date of Birth: 1946-04-19  [facility] CM/SW [place], RN Phone Number: 05/16/2022, 12:11 PM  Clinical Narrative:    The patient is a resident at [facility], I notified Hilda Blades that she will be returning today  EMS to transport  Patient will notify her family  EMS called to transport  Expected Discharge Plan: Home/Self Care Barriers to Discharge: Barriers Resolved  Expected Discharge Plan and Services Expected Discharge Plan: Home/Self Care   Discharge Planning Services: CM Consult   Living arrangements for the past 2 months: [place] Expected Discharge Date: 05/16/22                                     Social Determinants of Health (SDOH) Interventions    Readmission Risk Interventions     No data to display

## 2022-05-16 NOTE — Plan of Care (Signed)
  Problem: Education: Goal: Knowledge of General Education information will improve Description: Including pain rating scale, medication(s)/side effects and non-pharmacologic comfort measures Outcome: Progressing   Problem: Activity: Goal: Risk for activity intolerance will decrease Outcome: Progressing   Problem: Pain Managment: Goal: General experience of comfort will improve Outcome: Progressing   

## 2022-05-16 NOTE — Discharge Summary (Signed)
Physician Discharge Summary   Patient: Kimberly Cook MRN: [id number] DOB: 1945-11-04  Admit date:     05/14/2022  Discharge date: 05/16/22  Discharge Physician: [name]   PCP: System, Provider Not In   Recommendations at discharge:   Follow-up urology as outpatient  Discharge Diagnoses: Principal Problem:   Acute cystitis Active Problems:   Emesis   Elevated troponin   HFrEF (heart failure with reduced ejection fraction) (HCC)   CKD Stage IIIa   Depression   Hypothyroidism   Hypotension   Cystitis  Resolved Problems:   * No resolved hospital problems. *  Hospital Course:  76 year old female with medical history of prior ESBL UTI, HFrEF with EF 30 to 35%, CVA, CKD stage IIIa, hypertension, hypothyroidism who presented to the ED with nausea vomiting and urinary burning. hospitalized 9/7 to 9/13 for syncope secondary to UTI for which she was treated with 5 days of Bactrim. During hospitalization, she underwent flexible cystoscopy with Dr. Erlene Quan that revealed bladder wall thickening with bladder erythema.  On 10/9, she underwent bladder biopsy; preop antibiotics included Macrobid.   In the ED UA showed large hemoglobin, 50 RBCs, 21-50 WBCs.  Patient started on meropenem to cover ESBL.  Assessment and Plan:  Acute cystitis -Presents with 1 day history of dysuria with increased frequency of urination in setting of recent bladder biopsy -Path report showed inflammation but no evidence of malignancy -Preop culture grew ESBL for which she was pretreated with Macrobid and gentamicin -Patient was empirically started on meropenem for ESBL coverage -Urine culture showed no growth, will discontinue antibiotics  Vomiting -Patient presented with 24 hours of nonbloody emesis after recent bladder biopsy -Resolved -Continue Zofran as needed   Troponin elevation -Presented with mild elevation of troponin at 55, trending down to 39 -Chest tightness today, EKG shows no acute  changes  -Patient has been vomiting, likely from acid reflux      HFrEF -Patient is euvolemic -Continue home regimen   CKD stage III -Creatinine at baseline   Depression -Continue Xanax, Lexapro   Hypothyroidism -Continue levothyroxine   History of hypotension -Patient takes midodrine for hypotension -Continue home regimen           Consultants:  Procedures performed:  Disposition: Home Diet recommendation:  Discharge Diet Orders (From admission, onward)     Start     Ordered   05/16/22 0000  Diet - low sodium heart healthy        05/16/22 1138           Regular diet DISCHARGE MEDICATION: Allergies as of 05/16/2022   No Known Allergies      Medication List     STOP taking these medications    ondansetron 4 MG disintegrating tablet Commonly known as: ZOFRAN-ODT       TAKE these medications    acetaminophen 500 MG tablet Commonly known as: TYLENOL Take 1,000 mg by mouth every 8 (eight) hours as needed for moderate pain.   alendronate 70 MG tablet Commonly known as: FOSAMAX Take 70 mg by mouth once a week.   ALPRAZolam 0.5 MG tablet Commonly known as: XANAX Take 1 tablet (0.5 mg total) by mouth 2 (two) times daily as needed for anxiety.   artificial tears ointment Place 1 drop into both eyes daily. At bedtime   ascorbic acid 500 MG tablet Commonly known as: VITAMIN C Take 500 mg by mouth daily.   aspirin EC 81 MG tablet Take 1 tablet (81 mg total) by  mouth at bedtime. Swallow whole.   atorvastatin 20 MG tablet Commonly known as: LIPITOR Take 1 tablet (20 mg total) by mouth daily.   Biotin 1 MG Caps Take 1,000 mcg by mouth daily.   cetirizine 5 MG tablet Commonly known as: ZYRTEC Take 5 mg by mouth daily.   citalopram 20 MG tablet Commonly known as: CELEXA Take 20 mg by mouth daily.   Cranberry 450 MG Tabs Take 1 tablet by mouth every morning.   cyanocobalamin 1000 MCG tablet Commonly known as: VITAMIN B12 Take 1  tablet (1,000 mcg total) by mouth daily.   folic acid A999333 MCG tablet Commonly known as: FOLVITE Take 1 tablet (400 mcg total) by mouth daily. SEPARATE ALL SUPPLEMENTS TO LUNCH OR DINNER AND PRILOSEC NOT TO MESS W/THYROID MED   HYDROcodone-acetaminophen 5-325 MG tablet Commonly known as: NORCO/VICODIN Take 1 tablet by mouth every 6 (six) hours as needed for moderate pain.   hydrocortisone cream 1 % Apply 1 Application topically 2 (two) times daily as needed for itching.   levothyroxine 100 MCG tablet Commonly known as: SYNTHROID Take 1 tablet (100 mcg total) by mouth daily before breakfast. What changed:  when to take this additional instructions   magnesium oxide 400 (240 Mg) MG tablet Commonly known as: MAG-OX Take 1 tablet (400 mg total) by mouth daily.   melatonin 5 MG Tabs Take 1 tablet (5 mg total) by mouth at bedtime.   midodrine 10 MG tablet Commonly known as: PROAMATINE Take 1 tablet (10 mg total) by mouth 3 (three) times daily with meals.   montelukast 10 MG tablet Commonly known as: SINGULAIR Take 1 tablet (10 mg total) by mouth at bedtime.   multivitamin with minerals Tabs tablet Take 1 tablet by mouth daily.   nitrofurantoin (macrocrystal-monohydrate) 100 MG capsule Commonly known as: MACROBID Take 1 capsule (100 mg total) by mouth 2 (two) times daily.   omeprazole 40 MG capsule Commonly known as: PRILOSEC Take 1 capsule (40 mg total) by mouth daily. After lunch   ondansetron 4 MG tablet Commonly known as: Zofran Take 1 tablet (4 mg total) by mouth every 8 (eight) hours as needed for nausea or vomiting.   oxybutynin 5 MG tablet Commonly known as: DITROPAN Take 1 tablet (5 mg total) by mouth every 8 (eight) hours as needed for bladder spasms.   polyethylene glycol powder 17 GM/SCOOP powder Commonly known as: GLYCOLAX/MIRALAX Take 17 g by mouth daily as needed for moderate constipation.   potassium chloride SA 20 MEQ tablet Commonly known as:  KLOR-CON M Take 1 tablet (20 mEq total) by mouth 2 (two) times daily.   pregabalin 75 MG capsule Commonly known as: LYRICA Take 75 mg by mouth 3 (three) times daily.   Refresh Liquigel 1 % Gel Generic drug: Carboxymethylcellulose Sodium Place 1 drop into both eyes at bedtime.   torsemide 20 MG tablet Commonly known as: DEMADEX Take 20 mg by mouth daily.   traZODone 100 MG tablet Commonly known as: DESYREL Take 100 mg by mouth at bedtime.   Vitamin D3 125 MCG (5000 UT) Tabs Take 1 tablet (5,000 Units total) by mouth daily.        Discharge Exam: Filed Weights   05/15/22 1400  Weight: 80.5 kg   General-appears in no acute distress Heart-S1-S2, regular, no murmur auscultated Lungs-clear to auscultation bilaterally, no wheezing or crackles auscultated Abdomen-soft, nontender, no organomegaly Extremities-no edema in the lower extremities Neuro-alert, oriented x3, no focal deficit noted  Condition at  discharge: good  The results of significant diagnostics from this hospitalization (including imaging, microbiology, ancillary and laboratory) are listed below for reference.   Imaging Studies: CT ABDOMEN PELVIS W CONTRAST  Result Date: 05/14/2022 CLINICAL DATA:  Abdominal pain EXAM: CT ABDOMEN AND PELVIS WITH CONTRAST TECHNIQUE: Multidetector CT imaging of the abdomen and pelvis was performed using the standard protocol following bolus administration of intravenous contrast. RADIATION DOSE REDUCTION: This exam was performed according to the departmental dose-optimization program which includes automated exposure control, adjustment of the mA and/or kV according to patient size and/or use of iterative reconstruction technique. CONTRAST:  170mL OMNIPAQUE IOHEXOL 300 MG/ML  SOLN COMPARISON:  02/01/2022 FINDINGS: Lower chest: Mild linear scarring/atelectasis at the left lung base. Hepatobiliary: Liver is within normal limits. Status post cholecystectomy. No intrahepatic or  extrahepatic ductal dilatation. Pancreas: Within normal limits. Spleen: Within normal limits. Adrenals/Urinary Tract: Adrenal glands are within normal limits. 16 mm interpolar left renal sinus cyst (series 2/image 28), benign. No follow-up is recommended. Mild fullness of the left renal pelvis with indwelling double pigtail ureteral calculus in satisfactory position. Right kidney is within normal limits. Bladder is again notable for asymmetric wall thickening along the right lateral bladder wall (series 2/image 70). Trace nondependent gas. Stomach/Bowel: Stomach is within normal limits. No evidence of bowel obstruction. Appendix is not discretely visualized. Left colonic diverticulosis, without evidence of diverticulitis. Vascular/Lymphatic: No evidence of abdominal aortic aneurysm. No suspicious abdominopelvic lymphadenopathy. Reproductive: Status post hysterectomy. Bilateral ovaries are within normal limits. Other: No abdominopelvic ascites. Musculoskeletal: Thoracolumbar spine fixation hardware, incompletely visualized, with mild degenerative changes. IMPRESSION: Left double pigtail ureteral calculus in satisfactory position. Mild fullness of the left renal pelvis. Asymmetric wall thickening along the right lateral bladder wall, unchanged. Correlate with pending pathology results from recent cystoscopy. Additional ancillary and postsurgical findings as above. Electronically Signed   By: Julian Hy M.D.   On: 05/14/2022 17:12   DG Chest Portable 1 View  Result Date: 05/14/2022 CLINICAL DATA:  Weakness abdominal pain. EXAM: PORTABLE CHEST 1 VIEW COMPARISON:  Chest 04/11/2022 FINDINGS: Heart size and vascularity normal. Lungs clear without infiltrate effusion Multilevel posterior hardware fusion in the thoracic and lumbar spine unchanged. IMPRESSION: No active disease. Electronically Signed   By: Franchot Gallo M.D.   On: 05/14/2022 16:19   DG OR UROLOGY CYSTO IMAGE ([facility])  Result Date:  05/13/2022 There is no interpretation for this exam.  This order is for images obtained during a surgical procedure.  Please See "Surgeries" Tab for more information regarding the procedure.    Microbiology: Results for orders placed or performed during the hospital encounter of 05/14/22  SARS Coronavirus 2 by RT PCR (hospital order, performed in [facility] hospital lab) *cepheid single result test* Anterior Nasal Swab     Status: None   Collection Time: 05/14/22  3:52 PM   Specimen: Anterior Nasal Swab  Result Value Ref Range Status   SARS Coronavirus 2 by RT PCR NEGATIVE NEGATIVE Final    Comment: (NOTE) SARS-CoV-2 target nucleic acids are NOT DETECTED.  The SARS-CoV-2 RNA is generally detectable in upper and lower respiratory specimens during the acute phase of infection. The lowest concentration of SARS-CoV-2 viral copies this assay can detect is 250 copies / mL. A negative result does not preclude SARS-CoV-2 infection and should not be used as the sole basis for treatment or other patient management decisions.  A negative result may occur with improper specimen collection / handling, submission of specimen other than  nasopharyngeal swab, presence of viral mutation(s) within the areas targeted by this assay, and inadequate number of viral copies (<250 copies / mL). A negative result must be combined with clinical observations, patient history, and epidemiological information.  Fact Sheet for Patients:   https://www.patel.info/  Fact Sheet for Healthcare Providers: https://hall.com/  This test is not yet approved or  cleared by the [place] FDA and has been authorized for detection and/or diagnosis of SARS-CoV-2 by FDA under an Emergency Use Authorization (EUA).  This EUA will remain in effect (meaning this test can be used) for the duration of the COVID-19 declaration under Section 564(b)(1) of the Act, 21 U.S.C. section  360bbb-3(b)(1), unless the authorization is terminated or revoked sooner.  Performed at [facility], [address] [place], [place] [address]   Blood culture (single)     Status: None (Preliminary result)   Collection Time: 05/14/22  3:52 PM   Specimen: BLOOD  Result Value Ref Range Status   Specimen Description BLOOD BLOOD RIGHT HAND  Final   Special Requests   Final    BOTTLES DRAWN AEROBIC AND ANAEROBIC Blood Culture results may not be optimal due to an inadequate volume of blood received in culture bottles   Culture   Final    NO GROWTH 2 DAYS Performed at [facility], [address], [place], [place] [address]    Report Status PENDING  Incomplete  Urine Culture     Status: None   Collection Time: 05/14/22  4:36 PM   Specimen: Urine, Clean Catch  Result Value Ref Range Status   Specimen Description   Final    URINE, CLEAN CATCH Performed at [facility], [address], [place], [place] [address]    Special Requests   Final    NONE Performed at [facility], [address], [place], [place] [address]    Culture   Final    NO GROWTH Performed at [facility], [place] [address], [place], [place] [address]    Report Status 05/15/2022 FINAL  Final  Blood culture (single)     Status: None (Preliminary result)   Collection Time: 05/14/22  6:15 PM   Specimen: BLOOD  Result Value Ref Range Status   Specimen Description BLOOD BLOOD LEFT WRIST  Final   Special Requests   Final    BOTTLES DRAWN AEROBIC AND ANAEROBIC Blood Culture adequate volume   Culture   Final    NO GROWTH 2 DAYS Performed at [facility], [place], [place] [address]    Report Status PENDING  Incomplete    Labs: CBC: Recent Labs  Lab 05/14/22 1551 05/15/22 0447 05/16/22 0527  WBC 8.4 8.7 6.1  NEUTROABS 7.2  --   --   HGB 14.2 12.7 12.3  HCT 42.1 37.2 36.3  MCV 90.3 91.0 92.8  PLT 168 147* [id number]*   Basic Metabolic  Panel: Recent Labs  Lab 05/14/22 1551 05/15/22 0233 05/16/22 0527  NA 136 136 139  K 3.8 3.5 3.4*  CL 98 103 107  CO2 24 23 24   GLUCOSE [id number]* 136* 98  BUN 15 16 18   CREATININE 1.00 1.01* 0.88  CALCIUM 9.3 8.4* 8.5*   Liver Function Tests: Recent Labs  Lab 05/14/22 1551  AST 34  ALT 18  ALKPHOS 68  BILITOT 1.0  PROT 8.5*  ALBUMIN 4.6   CBG: No results for input(s): "GLUCAP" in the last 168 hours.  Discharge time spent: greater  than 30 minutes.  Signed: [name], MD [facility] 05/16/2022

## 2022-05-16 NOTE — Progress Notes (Signed)
1350 Report given to Dabetta unit manager at [facility]. All lines removed. VSS Pt cleaned brief on ready for transport.

## 2022-05-17 DIAGNOSIS — Z8673 Personal history of transient ischemic attack (TIA), and cerebral infarction without residual deficits: Secondary | ICD-10-CM | POA: Diagnosis not present

## 2022-05-17 DIAGNOSIS — E039 Hypothyroidism, unspecified: Secondary | ICD-10-CM | POA: Diagnosis not present

## 2022-05-17 DIAGNOSIS — I10 Essential (primary) hypertension: Secondary | ICD-10-CM | POA: Diagnosis not present

## 2022-05-17 DIAGNOSIS — F419 Anxiety disorder, unspecified: Secondary | ICD-10-CM | POA: Diagnosis not present

## 2022-05-17 DIAGNOSIS — F32A Depression, unspecified: Secondary | ICD-10-CM | POA: Diagnosis not present

## 2022-05-17 DIAGNOSIS — R262 Difficulty in walking, not elsewhere classified: Secondary | ICD-10-CM | POA: Diagnosis not present

## 2022-05-17 DIAGNOSIS — Z79899 Other long term (current) drug therapy: Secondary | ICD-10-CM | POA: Diagnosis not present

## 2022-05-17 DIAGNOSIS — R2689 Other abnormalities of gait and mobility: Secondary | ICD-10-CM | POA: Diagnosis not present

## 2022-05-17 DIAGNOSIS — K219 Gastro-esophageal reflux disease without esophagitis: Secondary | ICD-10-CM | POA: Diagnosis not present

## 2022-05-17 DIAGNOSIS — R52 Pain, unspecified: Secondary | ICD-10-CM | POA: Diagnosis not present

## 2022-05-17 DIAGNOSIS — M6281 Muscle weakness (generalized): Secondary | ICD-10-CM | POA: Diagnosis not present

## 2022-05-18 DIAGNOSIS — R2689 Other abnormalities of gait and mobility: Secondary | ICD-10-CM | POA: Diagnosis not present

## 2022-05-18 DIAGNOSIS — M6281 Muscle weakness (generalized): Secondary | ICD-10-CM | POA: Diagnosis not present

## 2022-05-18 DIAGNOSIS — R262 Difficulty in walking, not elsewhere classified: Secondary | ICD-10-CM | POA: Diagnosis not present

## 2022-05-19 DIAGNOSIS — R262 Difficulty in walking, not elsewhere classified: Secondary | ICD-10-CM | POA: Diagnosis not present

## 2022-05-19 DIAGNOSIS — M6281 Muscle weakness (generalized): Secondary | ICD-10-CM | POA: Diagnosis not present

## 2022-05-19 DIAGNOSIS — R2689 Other abnormalities of gait and mobility: Secondary | ICD-10-CM | POA: Diagnosis not present

## 2022-05-19 LAB — CULTURE, BLOOD (SINGLE)
Culture: NO GROWTH
Culture: NO GROWTH
Special Requests: ADEQUATE

## 2022-05-20 DIAGNOSIS — M6281 Muscle weakness (generalized): Secondary | ICD-10-CM | POA: Diagnosis not present

## 2022-05-20 DIAGNOSIS — R262 Difficulty in walking, not elsewhere classified: Secondary | ICD-10-CM | POA: Diagnosis not present

## 2022-05-20 DIAGNOSIS — R2689 Other abnormalities of gait and mobility: Secondary | ICD-10-CM | POA: Diagnosis not present

## 2022-05-21 ENCOUNTER — Ambulatory Visit (INDEPENDENT_AMBULATORY_CARE_PROVIDER_SITE_OTHER): Payer: Medicare HMO | Admitting: Urology

## 2022-05-21 VITALS — BP 138/74 | HR 68 | Ht 66.0 in | Wt 177.0 lb

## 2022-05-21 DIAGNOSIS — M6281 Muscle weakness (generalized): Secondary | ICD-10-CM | POA: Diagnosis not present

## 2022-05-21 DIAGNOSIS — R262 Difficulty in walking, not elsewhere classified: Secondary | ICD-10-CM | POA: Diagnosis not present

## 2022-05-21 DIAGNOSIS — N3289 Other specified disorders of bladder: Secondary | ICD-10-CM

## 2022-05-21 DIAGNOSIS — R2689 Other abnormalities of gait and mobility: Secondary | ICD-10-CM | POA: Diagnosis not present

## 2022-05-21 DIAGNOSIS — B372 Candidiasis of skin and nail: Secondary | ICD-10-CM | POA: Diagnosis not present

## 2022-05-21 DIAGNOSIS — R339 Retention of urine, unspecified: Secondary | ICD-10-CM | POA: Diagnosis not present

## 2022-05-21 MED ORDER — GENTAMICIN SULFATE 40 MG/ML IJ SOLN
80.0000 mg | Freq: Once | INTRAMUSCULAR | Status: AC
  Administered 2022-05-21: 80 mg via INTRAMUSCULAR

## 2022-05-21 MED ORDER — NYSTATIN 100000 UNIT/GM EX CREA: 1.0000 | TOPICAL_CREAM | Freq: Two times a day (BID) | CUTANEOUS | 0 refills | Status: DC

## 2022-05-21 NOTE — Progress Notes (Signed)
   05/21/22  CC:  Chief Complaint  Patient presents with   Procedure    Stent removal    HPI: 76 year old female who underwent bladder biopsy for suspicious erythematous patch on the right lateral bladder wall who returns today for stent removal.  Notably, intraoperatively there was some difficulty with left retrograde intubating the left UO with some concern for extravasation as such, as a precaution a left ureteral stent was placed.  She returns today for removal of this.  In the interim, she was admitted postop for nausea and vomiting possibly and anesthetic related issue.  Her urinalysis was consistent with a biopsy and urine culture was negative as she had been on antibiotics periprocedurally.  She reports that she still has a poor appetite but feeling somewhat better.  Surgical pathology was consistent with granulation right lateral bladder wall.  She is also noted to have some bladder wall thickening on the CT scan from her admission on this side.  There were no vitals taken for this visit. NED. A&Ox3.   No respiratory distress   Abd soft, NT, ND Normal external genitalia with patent urethral meatus  Cystoscopy/ Stent removal procedure  Patient identification was confirmed, informed consent was obtained, and patient was prepped using Betadine solution.  Lidocaine jelly was administered per urethral meatus.    Preoperative abx where received prior to procedure.    Procedure: - Flexible cystoscope introduced, without any difficulty.   - Thorough search of the bladder revealed:    normal urethral meatus  Stent seen emanating from left ureteral orifice, grasped with stent graspers, and removed in entirety.    Post-Procedure: - Patient tolerated the procedure well   Assessment/ Plan:  1. Erythematous bladder mucosa Pathology was briefly reviewed today although we will discuss it further at her follow-up visit as well  Benign consistent with granulation tissue of  unclear etiology, possibly related to chronic infection  Stent removed today without difficulty  Given her complex history and ESBL E. coli history, she was given a single dose of IM gentamicin out of upmost precaution.  Warning symptoms reviewed.  Follow-up in 4 weeks with renal bladder ultrasound. - gentamicin (GARAMYCIN) injection 80 mg - US RENAL; Future  2. Bladder wall thickening As above - gentamicin (GARAMYCIN) injection 80 mg - US RENAL; Future     Return in about 4 weeks (around 06/18/2022) for RUS/ discuss pathology in further detail.  Hollice Espy, MD

## 2022-05-22 DIAGNOSIS — M6281 Muscle weakness (generalized): Secondary | ICD-10-CM | POA: Diagnosis not present

## 2022-05-22 DIAGNOSIS — R262 Difficulty in walking, not elsewhere classified: Secondary | ICD-10-CM | POA: Diagnosis not present

## 2022-05-22 DIAGNOSIS — I639 Cerebral infarction, unspecified: Secondary | ICD-10-CM | POA: Diagnosis not present

## 2022-05-22 DIAGNOSIS — N1831 Chronic kidney disease, stage 3a: Secondary | ICD-10-CM | POA: Diagnosis not present

## 2022-05-22 DIAGNOSIS — E039 Hypothyroidism, unspecified: Secondary | ICD-10-CM | POA: Diagnosis not present

## 2022-05-22 DIAGNOSIS — F32A Depression, unspecified: Secondary | ICD-10-CM | POA: Diagnosis not present

## 2022-05-22 DIAGNOSIS — N3090 Cystitis, unspecified without hematuria: Secondary | ICD-10-CM | POA: Diagnosis not present

## 2022-05-22 DIAGNOSIS — E785 Hyperlipidemia, unspecified: Secondary | ICD-10-CM | POA: Diagnosis not present

## 2022-05-22 DIAGNOSIS — R1110 Vomiting, unspecified: Secondary | ICD-10-CM | POA: Diagnosis not present

## 2022-05-22 DIAGNOSIS — I5020 Unspecified systolic (congestive) heart failure: Secondary | ICD-10-CM | POA: Diagnosis not present

## 2022-05-22 DIAGNOSIS — R2689 Other abnormalities of gait and mobility: Secondary | ICD-10-CM | POA: Diagnosis not present

## 2022-05-23 DIAGNOSIS — R2689 Other abnormalities of gait and mobility: Secondary | ICD-10-CM | POA: Diagnosis not present

## 2022-05-23 DIAGNOSIS — H2513 Age-related nuclear cataract, bilateral: Secondary | ICD-10-CM | POA: Diagnosis not present

## 2022-05-23 DIAGNOSIS — E039 Hypothyroidism, unspecified: Secondary | ICD-10-CM | POA: Diagnosis not present

## 2022-05-23 DIAGNOSIS — E785 Hyperlipidemia, unspecified: Secondary | ICD-10-CM | POA: Diagnosis not present

## 2022-05-23 DIAGNOSIS — M6281 Muscle weakness (generalized): Secondary | ICD-10-CM | POA: Diagnosis not present

## 2022-05-23 DIAGNOSIS — F32A Depression, unspecified: Secondary | ICD-10-CM | POA: Diagnosis not present

## 2022-05-23 DIAGNOSIS — Z0100 Encounter for examination of eyes and vision without abnormal findings: Secondary | ICD-10-CM | POA: Diagnosis not present

## 2022-05-23 DIAGNOSIS — M810 Age-related osteoporosis without current pathological fracture: Secondary | ICD-10-CM | POA: Diagnosis not present

## 2022-05-23 DIAGNOSIS — F5101 Primary insomnia: Secondary | ICD-10-CM | POA: Diagnosis not present

## 2022-05-23 DIAGNOSIS — H43813 Vitreous degeneration, bilateral: Secondary | ICD-10-CM | POA: Diagnosis not present

## 2022-05-23 DIAGNOSIS — E539 Vitamin B deficiency, unspecified: Secondary | ICD-10-CM | POA: Diagnosis not present

## 2022-05-23 DIAGNOSIS — H353132 Nonexudative age-related macular degeneration, bilateral, intermediate dry stage: Secondary | ICD-10-CM | POA: Diagnosis not present

## 2022-05-23 DIAGNOSIS — R262 Difficulty in walking, not elsewhere classified: Secondary | ICD-10-CM | POA: Diagnosis not present

## 2022-05-23 DIAGNOSIS — N1831 Chronic kidney disease, stage 3a: Secondary | ICD-10-CM | POA: Diagnosis not present

## 2022-05-23 DIAGNOSIS — I509 Heart failure, unspecified: Secondary | ICD-10-CM | POA: Diagnosis not present

## 2022-05-24 DIAGNOSIS — I10 Essential (primary) hypertension: Secondary | ICD-10-CM | POA: Diagnosis not present

## 2022-05-24 DIAGNOSIS — E119 Type 2 diabetes mellitus without complications: Secondary | ICD-10-CM | POA: Diagnosis not present

## 2022-05-24 DIAGNOSIS — M19011 Primary osteoarthritis, right shoulder: Secondary | ICD-10-CM | POA: Diagnosis not present

## 2022-05-24 DIAGNOSIS — F32A Depression, unspecified: Secondary | ICD-10-CM | POA: Diagnosis not present

## 2022-05-24 DIAGNOSIS — F419 Anxiety disorder, unspecified: Secondary | ICD-10-CM | POA: Diagnosis not present

## 2022-05-24 DIAGNOSIS — R52 Pain, unspecified: Secondary | ICD-10-CM | POA: Diagnosis not present

## 2022-05-24 DIAGNOSIS — F1921 Other psychoactive substance dependence, in remission: Secondary | ICD-10-CM | POA: Diagnosis not present

## 2022-05-24 DIAGNOSIS — E559 Vitamin D deficiency, unspecified: Secondary | ICD-10-CM | POA: Diagnosis not present

## 2022-05-24 DIAGNOSIS — R2689 Other abnormalities of gait and mobility: Secondary | ICD-10-CM | POA: Diagnosis not present

## 2022-05-24 DIAGNOSIS — G4700 Insomnia, unspecified: Secondary | ICD-10-CM | POA: Diagnosis not present

## 2022-05-24 DIAGNOSIS — M542 Cervicalgia: Secondary | ICD-10-CM | POA: Diagnosis not present

## 2022-05-24 DIAGNOSIS — E039 Hypothyroidism, unspecified: Secondary | ICD-10-CM | POA: Diagnosis not present

## 2022-05-24 DIAGNOSIS — K219 Gastro-esophageal reflux disease without esophagitis: Secondary | ICD-10-CM | POA: Diagnosis not present

## 2022-05-24 DIAGNOSIS — R262 Difficulty in walking, not elsewhere classified: Secondary | ICD-10-CM | POA: Diagnosis not present

## 2022-05-24 DIAGNOSIS — M6281 Muscle weakness (generalized): Secondary | ICD-10-CM | POA: Diagnosis not present

## 2022-05-27 DIAGNOSIS — M6281 Muscle weakness (generalized): Secondary | ICD-10-CM | POA: Diagnosis not present

## 2022-05-27 DIAGNOSIS — R2689 Other abnormalities of gait and mobility: Secondary | ICD-10-CM | POA: Diagnosis not present

## 2022-05-27 DIAGNOSIS — R262 Difficulty in walking, not elsewhere classified: Secondary | ICD-10-CM | POA: Diagnosis not present

## 2022-05-27 DIAGNOSIS — E039 Hypothyroidism, unspecified: Secondary | ICD-10-CM | POA: Diagnosis not present

## 2022-05-28 DIAGNOSIS — M19011 Primary osteoarthritis, right shoulder: Secondary | ICD-10-CM | POA: Diagnosis not present

## 2022-05-28 DIAGNOSIS — R262 Difficulty in walking, not elsewhere classified: Secondary | ICD-10-CM | POA: Diagnosis not present

## 2022-05-28 DIAGNOSIS — Z23 Encounter for immunization: Secondary | ICD-10-CM | POA: Diagnosis not present

## 2022-05-28 DIAGNOSIS — E039 Hypothyroidism, unspecified: Secondary | ICD-10-CM | POA: Diagnosis not present

## 2022-05-28 DIAGNOSIS — R339 Retention of urine, unspecified: Secondary | ICD-10-CM | POA: Diagnosis not present

## 2022-05-28 DIAGNOSIS — M6281 Muscle weakness (generalized): Secondary | ICD-10-CM | POA: Diagnosis not present

## 2022-05-28 DIAGNOSIS — N1831 Chronic kidney disease, stage 3a: Secondary | ICD-10-CM | POA: Diagnosis not present

## 2022-05-28 DIAGNOSIS — R2689 Other abnormalities of gait and mobility: Secondary | ICD-10-CM | POA: Diagnosis not present

## 2022-05-28 DIAGNOSIS — E785 Hyperlipidemia, unspecified: Secondary | ICD-10-CM | POA: Diagnosis not present

## 2022-05-28 DIAGNOSIS — E539 Vitamin B deficiency, unspecified: Secondary | ICD-10-CM | POA: Diagnosis not present

## 2022-05-28 NOTE — Progress Notes (Deleted)
Cardiology Clinic Note   Patient Name: Kimberly Cook Date of Encounter: 05/28/2022  Primary Care Provider:  System, Provider Not In Primary Cardiologist:  Nelva Bush, MD  Patient Profile    76 year old female with history of nonobstructive CAD, hypertension, hyperlipidemia, hypothyroidism, stroke, GERD, CKD stage II, depression and anxiety, presents for follow-up today related to HFrEF (EF 45-50%).  Past Medical History    Past Medical History:  Diagnosis Date   Acute respiratory failure, unsp w hypoxia or hypercapnia (HCC)    Allergy    Anxiety    a.) on BZO (alprazolam) PRN   Aortic atherosclerosis (HCC)    Arthritis    Back pain    Chronic HFrEF (heart failure with reduced ejection fraction) ([place])    a. 08/2021 echo: EF 30-35%, glob HK, GrI DD; b. 10/2021 Echo: EF 30-35%.   CKD (chronic kidney disease), stage II    Coronary artery disease    a. Mild to moderate CAD in LAD/diagonal by CTA (CT-FFR of apical LAD 0.79); b. 08/2021 Cath: LM nl, LAD min irregs, D1/2/3 nl, LCX nl, OM2/3 nl, RCA 20p, RPDA mild dzs, RPAV nl.   DDD (degenerative disc disease), lumbar    Depression    Diverticulosis    Essential hypertension    GERD (gastroesophageal reflux disease)    Headache    Hiatal hernia    History of shingles    HLD (hyperlipidemia)    Hypothyroidism    Lung nodule    Mini stroke 2011   Moderate Pericardial effusion    a. 08/2021 Echo: moderate circumferential pericardial effusion w/o tamponade; b. 10/2021 Echo: EF 30-35%, small to mod circumferential pericardial effusion w/o tamponade.   Muscle weakness    NICM (nonischemic cardiomyopathy) ([place])    a. 08/2021 Echo: EF 30-35%, glob HK, GrI DD, nl RV fxn, mild-mod dil LA, mod circumferential pericardial eff w/o tamponade, Mod MR; b. 10/2021 Echo: EF 30-35%, glob HK.   Occipital neuralgia    On supplemental oxygen by nasal cannula    a.) 2L. at bedtime and PRN   Palpitations    Pancytopenia (HCC)    Pleural  effusion on left    a. 08/2021 s/p thoracentesis.   Pneumonia 2018   Polyneuropathy    Prediabetes    Renal cyst, left    a.) Renal artery [place] 12/26/2017: measured 2.1 cm.   Sleep difficulties    a.) takes trazodone + melatonin   Stroke ([place])    MRI 04/2008 + left sup. frontal gyrus possibly puntate infarct    Syncope 2019   Vitamin B deficiency    Vitamin D deficiency    Past Surgical History:  Procedure Laterality Date   ABDOMINAL HYSTERECTOMY     BLADDER SURGERY     2003   BREAST EXCISIONAL BIOPSY Right Over 20 years    Benign   CHOLECYSTECTOMY     CYSTOSCOPY W/ URETERAL STENT PLACEMENT Left 05/13/2022   Procedure: CYSTOSCOPY WITH RETROGRADE PYELOGRAM/URETERAL LEFT STENT PLACEMENT;  Surgeon: Hollice Espy, MD;  Location: [facility] ORS;  Service: Urology;  Laterality: Left;   CYSTOSCOPY WITH BIOPSY N/A 05/13/2022   Procedure: CYSTOSCOPY WITH BLADDER BIOPSY;  Surgeon: Hollice Espy, MD;  Location: [facility] ORS;  Service: Urology;  Laterality: N/A;   gastroplication      KNEE ARTHROSCOPY Left 2011   PULSE GENERATOR IMPLANT Left 01/31/2020   Procedure: PLACEMENT RIGHT FLANK PULSE GENERATOR VS REMOVAL SPINAL CORD STIMULATOR;  Surgeon: Deetta Perla, MD;  Location: [facility]  ORS;  Service: Neurosurgery;  Laterality: Left;   PULSE GENERATOR IMPLANT Left 04/24/2020   Procedure: REPLACEMENT LEFT FLANK PULSE GENERATOR IMPLANT;  Surgeon: Deetta Perla, MD;  Location: [facility] ORS;  Service: Neurosurgery;  Laterality: Left;  MAC w/ local   REPLACEMENT TOTAL KNEE Left    right arm fracture     RIGHT/LEFT HEART CATH AND CORONARY ANGIOGRAPHY N/A 08/28/2021   Procedure: RIGHT/LEFT HEART CATH AND CORONARY ANGIOGRAPHY;  Surgeon: Wellington Hampshire, MD;  Location: [facility];  Service: Cardiovascular;  Laterality: N/A;   SPINAL CORD STIMULATOR REMOVAL N/A 06/26/2020   Procedure: SPINAL CORD STIMULATOR REMOVAL;  Surgeon: Deetta Perla, MD;  Location: [facility] ORS;  Service: Neurosurgery;  Laterality: N/A;    THORACIC LAMINECTOMY FOR SPINAL CORD STIMULATOR N/A 01/24/2020   Procedure: THORACIC SPINAL CORD STIMULATOR PADDLE TRIAL VIA LAMINECTOMY;  Surgeon: Deetta Perla, MD;  Location: [facility] ORS;  Service: Neurosurgery;  Laterality: N/A;   TONSILLECTOMY AND ADENOIDECTOMY     TOTAL KNEE ARTHROPLASTY Left 04/17/2015   Procedure: LEFT TOTAL KNEE ARTHROPLASTY;  Surgeon: Paralee Cancel, MD;  Location: [facility] ORS;  Service: Orthopedics;  Laterality: Left;    Allergies  No Known Allergies  History of Present Illness    Kimberly Cook is a 76 year old female with past medical history including nonobstructive CAD, hypertension, hyperlipidemia, hypothyroidism, CKD 2, stroke, GERD, depression anxiety.     Prior echocardiogram in August 2018 revealed EF 50-55%, G1 DD.  Coronary CTA in September 2018 showed mild obstructive CAD involving the proximal LAD and second diagonal.  She subsequently had low risk Myoview in February 2022 in the setting of chest discomfort that was intermittent.  She was admitted to [facility] regional in late January 2023 with worsening dyspnea and ankle swelling.  She was also noted to have moderate bilateral pleural effusion.  Repeated echocardiogram showed EF of 30-35%, global hypokinesis, moderate mitral regurgitation, and moderate central pericardial effusion without tamponade.  Diagnostic catheterization showed mild proximal RCA disease with otherwise relatively normal coronary arteries.  She continued to be medically managed.  She did require thoracentesis of the left pleural effusion with 500 mL of fluid removed.  GDMT was escalated and she was subsequently discharged to skilled nursing facility on August 30, 2021.     She followed in clinic 10/17/2021 with improving minimal leg edema and resolution of shortness of breath.  She continued to follow-up with heart failure clinic with [facility] with minimal shortness of breath with moderate exertion and medication changes that were made but  noted weight increase of 14 pounds 1 month earlier.     She presented to the emergency department on 01/12/2022 for chest pain.  Vitals were normal, D-dimer was elevated, CTA of the chest was negative for PE and other acute findings, showed chronic hiatal hernia, DVT ultrasound was negative, high-sensitivity troponins negative x2 and BNP was within normal limits.  Chest leukocytosis.  EKG showed some QTc prolongation which improved she was considered for her chest pain was musculoskeletal and she was recommended for outpatient follow-up.   She was last seen in clinic on 8/23 with a cardiac standpoint was overall doing well.  There were no changes to her medications that were made and she was continue with her routine follow-up.   10/9/ 23 patient bilateral retrograde pyelogram, left ureteral stent placement, bladder biopsy the right lateral bladder wall.  She did have recurrent UTIs and antibiotic therapy.  She presented to the [facility] emergency department on 05/14/2022 with complaints of  abdominal pain, nausea, vomiting, and fatigue after a recent bladder ulceration.  She said she had had aches everywhere and states that she hurts from head down to her legs.  She denies any significant focal area of pain.  She had had a cystoscopy and biopsy done the day prior and had worsening symptoms since then.  Vitals on arrival to the emergency department with blood pressure 158/97, pulse of 97, respirations of 18, temperature of 99.1.  Lactic acid of 2.4 high-sensitivity troponin of 38.  She had a history of ESBL so was cover with meropenem in the emergency department was subsequently admitted for acute cystitis with hematuria and dehydration.  She was then discharged on 05/16/2022.   She returns clinic today  Home Medications    Current Outpatient Medications  Medication Sig Dispense Refill   acetaminophen (TYLENOL) 500 MG tablet Take 1,000 mg by mouth every 8 (eight) hours as needed for moderate pain.      alendronate (FOSAMAX) 70 MG tablet Take 70 mg by mouth once a week.      ALPRAZolam (XANAX) 0.5 MG tablet Take 1 tablet (0.5 mg total) by mouth 2 (two) times daily as needed for anxiety. 5 tablet 0   ascorbic acid (VITAMIN C) 500 MG tablet Take 500 mg by mouth daily.     aspirin EC 81 MG tablet Take 1 tablet (81 mg total) by mouth at bedtime. Swallow whole. 90 tablet 3   atorvastatin (LIPITOR) 20 MG tablet Take 1 tablet (20 mg total) by mouth daily. 90 tablet 3   Biotin 1 MG CAPS Take 1,000 mcg by mouth daily.     Carboxymethylcellulose Sodium (REFRESH LIQUIGEL) 1 % GEL Place 1 drop into both eyes at bedtime.     cetirizine (ZYRTEC) 5 MG tablet Take 5 mg by mouth daily.     Cholecalciferol (VITAMIN D3) 125 MCG (5000 UT) TABS Take 1 tablet (5,000 Units total) by mouth daily. 90 tablet 3   citalopram (CELEXA) 20 MG tablet Take 20 mg by mouth daily.     Cranberry 450 MG TABS Take 1 tablet by mouth every morning.     folic acid (FOLVITE) 211 MCG tablet Take 1 tablet (400 mcg total) by mouth daily. SEPARATE ALL SUPPLEMENTS TO LUNCH OR DINNER AND PRILOSEC NOT TO MESS W/THYROID MED 90 tablet 3   HYDROcodone-acetaminophen (NORCO/VICODIN) 5-325 MG tablet Take 1 tablet by mouth every 6 (six) hours as needed for moderate pain. 10 tablet 0   hydrocortisone cream 1 % Apply 1 Application topically 2 (two) times daily as needed for itching.     levothyroxine (SYNTHROID) 100 MCG tablet Take 1 tablet (100 mcg total) by mouth daily before breakfast. (Patient taking differently: Take 100 mcg by mouth daily. At bedtime) 30 tablet 0   magnesium oxide (MAG-OX) 400 (240 Mg) MG tablet Take 1 tablet (400 mg total) by mouth daily.     melatonin 5 MG TABS Take 1 tablet (5 mg total) by mouth at bedtime.  0   midodrine (PROAMATINE) 10 MG tablet Take 1 tablet (10 mg total) by mouth 3 (three) times daily with meals. 60 tablet 1   montelukast (SINGULAIR) 10 MG tablet Take 1 tablet (10 mg total) by mouth at bedtime. 90 tablet 3    Multiple Vitamin (MULTIVITAMIN WITH MINERALS) TABS tablet Take 1 tablet by mouth daily.     nystatin cream (MYCOSTATIN) Apply 1 Application topically 2 (two) times daily. 30 g 0   omeprazole (PRILOSEC) 40 MG capsule  Take 1 capsule (40 mg total) by mouth daily. After lunch 90 capsule 3   ondansetron (ZOFRAN) 4 MG tablet Take 1 tablet (4 mg total) by mouth every 8 (eight) hours as needed for nausea or vomiting. 20 tablet 0   oxybutynin (DITROPAN) 5 MG tablet Take 1 tablet (5 mg total) by mouth every 8 (eight) hours as needed for bladder spasms. 30 tablet 0   polyethylene glycol powder (GLYCOLAX/MIRALAX) 17 GM/SCOOP powder Take 17 g by mouth daily as needed for moderate constipation. 255 g 11   potassium chloride SA (KLOR-CON M) 20 MEQ tablet Take 1 tablet (20 mEq total) by mouth 2 (two) times daily.     pregabalin (LYRICA) 75 MG capsule Take 75 mg by mouth 3 (three) times daily.     torsemide (DEMADEX) 20 MG tablet Take 20 mg by mouth daily.     traZODone (DESYREL) 100 MG tablet Take 100 mg by mouth at bedtime.     vitamin B-12 (CYANOCOBALAMIN) 1000 MCG tablet Take 1 tablet (1,000 mcg total) by mouth daily. 90 tablet 3   White Petrolatum-Mineral Oil (ARTIFICIAL TEARS) ointment Place 1 drop into both eyes daily. At bedtime     No current facility-administered medications for this visit.     Family History    Family History  Problem Relation Age of Onset   Heart disease Mother    Hypertension Mother    Diabetes Mother    Heart attack Mother 52   Heart disease Father    Heart attack Father 90   Breast cancer Maternal Aunt    Heart attack Brother    She indicated that her mother is deceased. She indicated that her father is deceased. She indicated that her brother is deceased. She indicated that her maternal aunt is deceased.  Social History    Social History   Socioeconomic History   Marital status: Widowed    Spouse name: Not on file   Number of children: Not on file   Years of  education: Not on file   Highest education level: Not on file  Occupational History   Not on file  Tobacco Use   Smoking status: Never    Passive exposure: Yes   Smokeless tobacco: Never   Tobacco comments:    husbands and children smoked in home.   Vaping Use   Vaping Use: Never used  Substance and Sexual Activity   Alcohol use: No   Drug use: No   Sexual activity: Never  Other Topics Concern   Not on file  Social History Narrative   Widowed    Lives at [facility]   Has kids and grandson    Social Determinants of Health   Financial Resource Strain: Low Risk  (07/24/2020)   Overall Financial Resource Strain (CARDIA)    Difficulty of Paying Living Expenses: Not hard at all  Food Insecurity: No Food Insecurity (05/15/2022)   Hunger Vital Sign    Worried About Running Out of Food in the Last Year: Never true    Ran Out of Food in the Last Year: Never true  Transportation Needs: No Transportation Needs (05/15/2022)   PRAPARE - Hydrologist (Medical): No    Lack of Transportation (Non-Medical): No  Physical Activity: Unknown (07/24/2020)   Exercise Vital Sign    Days of Exercise per Week: 0 days    Minutes of Exercise per Session: Not on file  Stress: No Stress Concern Present (07/24/2020)  [place] Questionnaire    Feeling of Stress : Only a little  Social Connections: Unknown (07/24/2020)   Social Connection and Isolation Panel [NHANES]    Frequency of Communication with Friends and Family: More than three times a week    Frequency of Social Gatherings with Friends and Family: More than three times a week    Attends Religious Services: Not on file    Active Member of Clubs or Organizations: Not on file    Attends Archivist Meetings: Not on file    Marital Status: Not on file  Intimate Partner Violence: Not At Risk (05/15/2022)   Humiliation, Afraid, Rape, and Kick  questionnaire    Fear of Current or Ex-Partner: No    Emotionally Abused: No    Physically Abused: No    Sexually Abused: No     Review of Systems    General:  No chills, fever, night sweats or weight changes.  Cardiovascular:  No chest pain, dyspnea on exertion, edema, orthopnea, palpitations, paroxysmal nocturnal dyspnea. Dermatological: No rash, lesions/masses Respiratory: No cough, dyspnea Urologic: No hematuria, dysuria Abdominal:   No nausea, vomiting, diarrhea, bright red blood per rectum, melena, or hematemesis Neurologic:  No visual changes, wkns, changes in mental status. All other systems reviewed and are otherwise negative except as noted above.     Physical Exam    VS:  There were no vitals taken for this visit. , BMI There is no height or weight on file to calculate BMI.     GEN: Well nourished, well developed, in no acute distress. HEENT: normal. Neck: Supple, no JVD, carotid bruits, or masses. Cardiac: RRR, no murmurs, rubs, or gallops. No clubbing, cyanosis, edema.  Radials/DP/PT 2+ and equal bilaterally.  Respiratory:  Respirations regular and unlabored, clear to auscultation bilaterally. GI: Soft, nontender, nondistended, BS + x 4. MS: no deformity or atrophy. Skin: warm and dry, no rash. Neuro:  Strength and sensation are intact. Psych: Normal affect.  Accessory Clinical Findings    ECG personally reviewed by me today- *** - No acute changes  Lab Results  Component Value Date   WBC 6.1 05/16/2022   HGB 12.3 05/16/2022   HCT 36.3 05/16/2022   MCV 92.8 05/16/2022   PLT 143 (L) 05/16/2022   Lab Results  Component Value Date   CREATININE 0.88 05/16/2022   BUN 18 05/16/2022   NA 139 05/16/2022   K 3.4 (L) 05/16/2022   CL 107 05/16/2022   CO2 24 05/16/2022   Lab Results  Component Value Date   ALT 18 05/14/2022   AST 34 05/14/2022   ALKPHOS 68 05/14/2022   BILITOT 1.0 05/14/2022   Lab Results  Component Value Date   CHOL 125 11/24/2019    HDL 46.40 11/24/2019   LDLCALC 47 11/24/2019   TRIG 158.0 (H) 11/24/2019   CHOLHDL 3 11/24/2019    Lab Results  Component Value Date   HGBA1C 5.1 11/24/2019    Assessment & Plan   1.  ***  Kimberly Rothschild, NP 05/28/2022, 8:41 PM

## 2022-05-29 ENCOUNTER — Ambulatory Visit: Payer: Medicare HMO | Admitting: Cardiology

## 2022-05-29 DIAGNOSIS — M6281 Muscle weakness (generalized): Secondary | ICD-10-CM | POA: Diagnosis not present

## 2022-05-29 DIAGNOSIS — R2689 Other abnormalities of gait and mobility: Secondary | ICD-10-CM | POA: Diagnosis not present

## 2022-05-29 DIAGNOSIS — R262 Difficulty in walking, not elsewhere classified: Secondary | ICD-10-CM | POA: Diagnosis not present

## 2022-05-30 DIAGNOSIS — M6281 Muscle weakness (generalized): Secondary | ICD-10-CM | POA: Diagnosis not present

## 2022-05-30 DIAGNOSIS — R2689 Other abnormalities of gait and mobility: Secondary | ICD-10-CM | POA: Diagnosis not present

## 2022-05-30 DIAGNOSIS — K5900 Constipation, unspecified: Secondary | ICD-10-CM | POA: Diagnosis not present

## 2022-05-30 DIAGNOSIS — Z91199 Patient's noncompliance with other medical treatment and regimen due to unspecified reason: Secondary | ICD-10-CM | POA: Diagnosis not present

## 2022-05-30 DIAGNOSIS — R262 Difficulty in walking, not elsewhere classified: Secondary | ICD-10-CM | POA: Diagnosis not present

## 2022-05-31 DIAGNOSIS — M6281 Muscle weakness (generalized): Secondary | ICD-10-CM | POA: Diagnosis not present

## 2022-05-31 DIAGNOSIS — R2689 Other abnormalities of gait and mobility: Secondary | ICD-10-CM | POA: Diagnosis not present

## 2022-05-31 DIAGNOSIS — R262 Difficulty in walking, not elsewhere classified: Secondary | ICD-10-CM | POA: Diagnosis not present

## 2022-06-01 DIAGNOSIS — R262 Difficulty in walking, not elsewhere classified: Secondary | ICD-10-CM | POA: Diagnosis not present

## 2022-06-01 DIAGNOSIS — R2689 Other abnormalities of gait and mobility: Secondary | ICD-10-CM | POA: Diagnosis not present

## 2022-06-01 DIAGNOSIS — M6281 Muscle weakness (generalized): Secondary | ICD-10-CM | POA: Diagnosis not present

## 2022-06-03 DIAGNOSIS — R262 Difficulty in walking, not elsewhere classified: Secondary | ICD-10-CM | POA: Diagnosis not present

## 2022-06-03 DIAGNOSIS — F5101 Primary insomnia: Secondary | ICD-10-CM | POA: Diagnosis not present

## 2022-06-03 DIAGNOSIS — M6281 Muscle weakness (generalized): Secondary | ICD-10-CM | POA: Diagnosis not present

## 2022-06-03 DIAGNOSIS — R52 Pain, unspecified: Secondary | ICD-10-CM | POA: Diagnosis not present

## 2022-06-03 DIAGNOSIS — F32A Depression, unspecified: Secondary | ICD-10-CM | POA: Diagnosis not present

## 2022-06-03 DIAGNOSIS — Z760 Encounter for issue of repeat prescription: Secondary | ICD-10-CM | POA: Diagnosis not present

## 2022-06-03 DIAGNOSIS — K5900 Constipation, unspecified: Secondary | ICD-10-CM | POA: Diagnosis not present

## 2022-06-03 DIAGNOSIS — Z79899 Other long term (current) drug therapy: Secondary | ICD-10-CM | POA: Diagnosis not present

## 2022-06-03 DIAGNOSIS — M19011 Primary osteoarthritis, right shoulder: Secondary | ICD-10-CM | POA: Diagnosis not present

## 2022-06-03 DIAGNOSIS — R2689 Other abnormalities of gait and mobility: Secondary | ICD-10-CM | POA: Diagnosis not present

## 2022-06-03 DIAGNOSIS — E539 Vitamin B deficiency, unspecified: Secondary | ICD-10-CM | POA: Diagnosis not present

## 2022-06-03 DIAGNOSIS — G4700 Insomnia, unspecified: Secondary | ICD-10-CM | POA: Diagnosis not present

## 2022-06-03 DIAGNOSIS — F4323 Adjustment disorder with mixed anxiety and depressed mood: Secondary | ICD-10-CM | POA: Diagnosis not present

## 2022-06-04 DIAGNOSIS — R2689 Other abnormalities of gait and mobility: Secondary | ICD-10-CM | POA: Diagnosis not present

## 2022-06-04 DIAGNOSIS — R262 Difficulty in walking, not elsewhere classified: Secondary | ICD-10-CM | POA: Diagnosis not present

## 2022-06-04 DIAGNOSIS — M6281 Muscle weakness (generalized): Secondary | ICD-10-CM | POA: Diagnosis not present

## 2022-06-05 DIAGNOSIS — R262 Difficulty in walking, not elsewhere classified: Secondary | ICD-10-CM | POA: Diagnosis not present

## 2022-06-05 DIAGNOSIS — M6281 Muscle weakness (generalized): Secondary | ICD-10-CM | POA: Diagnosis not present

## 2022-06-06 DIAGNOSIS — M6281 Muscle weakness (generalized): Secondary | ICD-10-CM | POA: Diagnosis not present

## 2022-06-06 DIAGNOSIS — K5900 Constipation, unspecified: Secondary | ICD-10-CM | POA: Diagnosis not present

## 2022-06-06 DIAGNOSIS — R262 Difficulty in walking, not elsewhere classified: Secondary | ICD-10-CM | POA: Diagnosis not present

## 2022-06-07 DIAGNOSIS — M6281 Muscle weakness (generalized): Secondary | ICD-10-CM | POA: Diagnosis not present

## 2022-06-07 DIAGNOSIS — R262 Difficulty in walking, not elsewhere classified: Secondary | ICD-10-CM | POA: Diagnosis not present

## 2022-06-11 DIAGNOSIS — M6281 Muscle weakness (generalized): Secondary | ICD-10-CM | POA: Diagnosis not present

## 2022-06-11 DIAGNOSIS — R262 Difficulty in walking, not elsewhere classified: Secondary | ICD-10-CM | POA: Diagnosis not present

## 2022-06-11 DIAGNOSIS — Z23 Encounter for immunization: Secondary | ICD-10-CM | POA: Diagnosis not present

## 2022-06-12 DIAGNOSIS — M6281 Muscle weakness (generalized): Secondary | ICD-10-CM | POA: Diagnosis not present

## 2022-06-12 DIAGNOSIS — R262 Difficulty in walking, not elsewhere classified: Secondary | ICD-10-CM | POA: Diagnosis not present

## 2022-06-13 DIAGNOSIS — M6281 Muscle weakness (generalized): Secondary | ICD-10-CM | POA: Diagnosis not present

## 2022-06-13 DIAGNOSIS — G4700 Insomnia, unspecified: Secondary | ICD-10-CM | POA: Diagnosis not present

## 2022-06-13 DIAGNOSIS — R262 Difficulty in walking, not elsewhere classified: Secondary | ICD-10-CM | POA: Diagnosis not present

## 2022-06-14 ENCOUNTER — Ambulatory Visit
Admission: RE | Admit: 2022-06-14 | Discharge: 2022-06-14 | Disposition: A | Discharge status: Home / Self Care | Payer: Medicare HMO | Source: Ambulatory Visit | Attending: Urology | Admitting: Urology

## 2022-06-14 DIAGNOSIS — N281 Cyst of kidney, acquired: Secondary | ICD-10-CM | POA: Diagnosis not present

## 2022-06-14 DIAGNOSIS — R9341 Abnormal radiologic findings on diagnostic imaging of renal pelvis, ureter, or bladder: Secondary | ICD-10-CM | POA: Insufficient documentation

## 2022-06-14 DIAGNOSIS — N3289 Other specified disorders of bladder: Secondary | ICD-10-CM | POA: Diagnosis not present

## 2022-06-16 DIAGNOSIS — M6281 Muscle weakness (generalized): Secondary | ICD-10-CM | POA: Diagnosis not present

## 2022-06-16 DIAGNOSIS — R262 Difficulty in walking, not elsewhere classified: Secondary | ICD-10-CM | POA: Diagnosis not present

## 2022-06-17 DIAGNOSIS — R262 Difficulty in walking, not elsewhere classified: Secondary | ICD-10-CM | POA: Diagnosis not present

## 2022-06-17 DIAGNOSIS — M6281 Muscle weakness (generalized): Secondary | ICD-10-CM | POA: Diagnosis not present

## 2022-06-17 DIAGNOSIS — F5101 Primary insomnia: Secondary | ICD-10-CM | POA: Diagnosis not present

## 2022-06-17 DIAGNOSIS — F4323 Adjustment disorder with mixed anxiety and depressed mood: Secondary | ICD-10-CM | POA: Diagnosis not present

## 2022-06-17 NOTE — Progress Notes (Signed)
Cardiology Clinic Note   Patient Name: Kimberly Cook Date of Encounter: 06/21/2022  Primary Care Provider:  System, Provider Not In Primary Cardiologist:  Nelva Bush, MD  Patient Profile    76 year old female with history of nonobstructive CAD, hypertension, hyperlipidemia, hypothyroidism, stroke, GERD, CKD stage II, depression and anxiety, presents for follow-up today related to HFrEF (EF 45-50%).  Past Medical History    Past Medical History:  Diagnosis Date   Acute respiratory failure, unsp w hypoxia or hypercapnia (HCC)    Allergy    Anxiety    a.) on BZO (alprazolam) PRN   Aortic atherosclerosis (HCC)    Arthritis    Back pain    Chronic HFrEF (heart failure with reduced ejection fraction) ([place])    a. 08/2021 echo: EF 30-35%, glob HK, GrI DD; b. 10/2021 Echo: EF 30-35%.   CKD (chronic kidney disease), stage II    Coronary artery disease    a. Mild to moderate CAD in LAD/diagonal by CTA (CT-FFR of apical LAD 0.79); b. 08/2021 Cath: LM nl, LAD min irregs, D1/2/3 nl, LCX nl, OM2/3 nl, RCA 20p, RPDA mild dzs, RPAV nl.   DDD (degenerative disc disease), lumbar    Depression    Diverticulosis    Essential hypertension    GERD (gastroesophageal reflux disease)    Headache    Hiatal hernia    History of shingles    HLD (hyperlipidemia)    Hypothyroidism    Lung nodule    Mini stroke 2011   Moderate Pericardial effusion    a. 08/2021 Echo: moderate circumferential pericardial effusion w/o tamponade; b. 10/2021 Echo: EF 30-35%, small to mod circumferential pericardial effusion w/o tamponade.   Muscle weakness    NICM (nonischemic cardiomyopathy) ([place])    a. 08/2021 Echo: EF 30-35%, glob HK, GrI DD, nl RV fxn, mild-mod dil LA, mod circumferential pericardial eff w/o tamponade, Mod MR; b. 10/2021 Echo: EF 30-35%, glob HK.   Occipital neuralgia    On supplemental oxygen by nasal cannula    a.) 2L.[place] at bedtime and PRN   Palpitations    Pancytopenia (HCC)    Pleural  effusion on left    a. 08/2021 s/p thoracentesis.   Pneumonia 2018   Polyneuropathy    Prediabetes    Renal cyst, left    a.) Renal artery [place] 12/26/2017: measured 2.1 cm.   Sleep difficulties    a.) takes trazodone + melatonin   Stroke ([place])    MRI 04/2008 + left sup. frontal gyrus possibly puntate infarct    Syncope 2019   Vitamin B deficiency    Vitamin D deficiency    Past Surgical History:  Procedure Laterality Date   ABDOMINAL HYSTERECTOMY     BLADDER SURGERY     2003   BREAST EXCISIONAL BIOPSY Right Over 20 years    Benign   CHOLECYSTECTOMY     CYSTOSCOPY W/ URETERAL STENT PLACEMENT Left 05/13/2022   Procedure: CYSTOSCOPY WITH RETROGRADE PYELOGRAM/URETERAL LEFT STENT PLACEMENT;  Surgeon: Hollice Espy, MD;  Location: [facility] ORS;  Service: Urology;  Laterality: Left;   CYSTOSCOPY WITH BIOPSY N/A 05/13/2022   Procedure: CYSTOSCOPY WITH BLADDER BIOPSY;  Surgeon: Hollice Espy, MD;  Location: [facility] ORS;  Service: Urology;  Laterality: N/A;   gastroplication      KNEE ARTHROSCOPY Left 2011   PULSE GENERATOR IMPLANT Left 01/31/2020   Procedure: PLACEMENT RIGHT FLANK PULSE GENERATOR VS REMOVAL SPINAL CORD STIMULATOR;  Surgeon: Deetta Perla, MD;  Location: [facility]  ORS;  Service: Neurosurgery;  Laterality: Left;   PULSE GENERATOR IMPLANT Left 04/24/2020   Procedure: REPLACEMENT LEFT FLANK PULSE GENERATOR IMPLANT;  Surgeon: Deetta Perla, MD;  Location: [facility] ORS;  Service: Neurosurgery;  Laterality: Left;  MAC w/ local   REPLACEMENT TOTAL KNEE Left    right arm fracture     RIGHT/LEFT HEART CATH AND CORONARY ANGIOGRAPHY N/A 08/28/2021   Procedure: RIGHT/LEFT HEART CATH AND CORONARY ANGIOGRAPHY;  Surgeon: Wellington Hampshire, MD;  Location: [facility];  Service: Cardiovascular;  Laterality: N/A;   SPINAL CORD STIMULATOR REMOVAL N/A 06/26/2020   Procedure: SPINAL CORD STIMULATOR REMOVAL;  Surgeon: Deetta Perla, MD;  Location: [facility] ORS;  Service: Neurosurgery;  Laterality: N/A;    THORACIC LAMINECTOMY FOR SPINAL CORD STIMULATOR N/A 01/24/2020   Procedure: THORACIC SPINAL CORD STIMULATOR PADDLE TRIAL VIA LAMINECTOMY;  Surgeon: Deetta Perla, MD;  Location: [facility] ORS;  Service: Neurosurgery;  Laterality: N/A;   TONSILLECTOMY AND ADENOIDECTOMY     TOTAL KNEE ARTHROPLASTY Left 04/17/2015   Procedure: LEFT TOTAL KNEE ARTHROPLASTY;  Surgeon: Paralee Cancel, MD;  Location: [facility] ORS;  Service: Orthopedics;  Laterality: Left;    Allergies  No Known Allergies  History of Present Illness    Kimberly Cook is a 76 year old female with past medical history including nonobstructive CAD, hypertension, hyperlipidemia, hypothyroidism, CKD 2, stroke, GERD, depression anxiety.     Prior echocardiogram in August 2018 revealed EF 50-55%, G1 DD.  Coronary CTA in September 2018 showed mild obstructive CAD involving the proximal LAD and second diagonal.  She subsequently had low risk Myoview in February 2022 in the setting of chest discomfort that was intermittent.  She was admitted to [facility] in late January 2023 with worsening dyspnea and ankle swelling.  She was also noted to have moderate bilateral pleural effusion.  Repeated echocardiogram showed EF of 30-35%, global hypokinesis, moderate mitral regurgitation, and moderate central pericardial effusion without tamponade.  Diagnostic catheterization showed mild proximal RCA disease with otherwise relatively normal coronary arteries.  She continued to be medically managed.  She did require thoracentesis of the left pleural effusion with 500 mL of fluid removed.  GDMT was escalated and she was subsequently discharged to skilled nursing facility on August 30, 2021.     She followed in clinic 10/17/2021 with improving minimal leg edema and resolution of shortness of breath.  She continued to follow-up with heart failure clinic with [facility] with minimal shortness of breath with moderate exertion and medication changes that were made but  noted weight increase of 14 pounds 1 month earlier.     She presented to the emergency department on 01/12/2022 for chest pain.  Vitals were normal, D-dimer was elevated, CTA of the chest was negative for PE and other acute findings, showed chronic hiatal hernia, DVT ultrasound was negative, high-sensitivity troponins negative x2 and BNP was within normal limits.  Chest leukocytosis.  EKG showed some QTc prolongation which improved she was considered for her chest pain was musculoskeletal and she was recommended for outpatient follow-up.   She was last seen in clinic on 8/23 with a cardiac standpoint was overall doing well.  There were no changes to her medications that were made and she was continue with her routine follow-up.   10/9/ 23 patient bilateral retrograde pyelogram, left ureteral stent placement, bladder biopsy the right lateral bladder wall.  She did have recurrent UTIs and antibiotic therapy.   She presented to the [facility] emergency department on 05/14/2022 with complaints  of abdominal pain, nausea, vomiting, and fatigue after a recent bladder ulceration.  She said she had had aches everywhere and states that she hurts from head down to her legs.  She denies any significant focal area of pain.  She had had a cystoscopy and biopsy done the day prior and had worsening symptoms since then.  Vitals on arrival to the emergency department with blood pressure 158/97, pulse of 97, respirations of 18, temperature of 99.1.  Lactic acid of 2.4 high-sensitivity troponin of 38.  She had a history of ESBL so was cover with meropenem in the emergency department was subsequently admitted for acute cystitis with hematuria and dehydration.  She was then discharged on 05/16/2022.  Patient is also just recently followed up with heart failure clinic with Darylene Price, [place] and was recently started on Entresto 24/26 mg twice daily.   She returns clinic today stating that overall she has been doing fairly well.  She  continues to have chronic shortness of breath and swelling to her bilateral lower extremities that she continues to wear compression stockings for.  She states that she was just recently started on a new medication but unfortunately the facility where she resides just got the medication in yesterday so she had her first dose this morning.  She has been without her diuretic she says for quite some time.  She states that the compression stockings do help with the swelling to her bilateral lower extremities.  She denies any current chest pain, palpitations, and endorses her chronic dyspnea on exertion that she stated is unchanged.  Home Medications    Current Outpatient Medications  Medication Sig Dispense Refill   acetaminophen (TYLENOL) 500 MG tablet Take 1,000 mg by mouth every 8 (eight) hours as needed for moderate pain.     alendronate (FOSAMAX) 70 MG tablet Take 70 mg by mouth once a week.      ALPRAZolam (XANAX) 0.5 MG tablet Take 1 tablet (0.5 mg total) by mouth 2 (two) times daily as needed for anxiety. (Patient taking differently: Take 0.5 mg by mouth 3 (three) times daily as needed for anxiety.) 5 tablet 0   ARTIFICIAL TEARS 0.2-0.2-1 % SOLN Apply to eye.     aspirin EC 81 MG tablet Take 1 tablet (81 mg total) by mouth at bedtime. Swallow whole. 90 tablet 3   atorvastatin (LIPITOR) 20 MG tablet Take 1 tablet (20 mg total) by mouth daily. 90 tablet 3   Carboxymethylcellulose Sodium (REFRESH LIQUIGEL) 1 % GEL Place 1 drop into both eyes at bedtime.     cetirizine (ZYRTEC) 5 MG tablet Take 5 mg by mouth daily.     Cholecalciferol (VITAMIN D3) 125 MCG (5000 UT) TABS Take 1 tablet (5,000 Units total) by mouth daily. 90 tablet 3   citalopram (CELEXA) 20 MG tablet Take 20 mg by mouth daily.     Cranberry 450 MG TABS Take 1 tablet by mouth every morning.     folic acid (FOLVITE) 381 MCG tablet Take 1 tablet (400 mcg total) by mouth daily. SEPARATE ALL SUPPLEMENTS TO LUNCH OR DINNER AND PRILOSEC NOT  TO MESS W/THYROID MED 90 tablet 3   HYDROcodone-acetaminophen (NORCO/VICODIN) 5-325 MG tablet Take 1 tablet by mouth every 6 (six) hours as needed for moderate pain. 10 tablet 0   hydrocortisone cream 1 % Apply 1 Application topically 2 (two) times daily as needed for itching.     levothyroxine (SYNTHROID) 100 MCG tablet Take 1 tablet (100 mcg total) by  mouth daily before breakfast. (Patient taking differently: Take 100 mcg by mouth daily. At bedtime) 30 tablet 0   melatonin 5 MG TABS Take 1 tablet (5 mg total) by mouth at bedtime.  0   midodrine (PROAMATINE) 10 MG tablet Take 1 tablet (10 mg total) by mouth 3 (three) times daily with meals. 60 tablet 1   montelukast (SINGULAIR) 10 MG tablet Take 1 tablet (10 mg total) by mouth at bedtime. 90 tablet 3   Multiple Vitamin (MULTIVITAMIN WITH MINERALS) TABS tablet Take 1 tablet by mouth daily.     nystatin cream (MYCOSTATIN) Apply 1 Application topically 2 (two) times daily. 30 g 0   omeprazole (PRILOSEC) 40 MG capsule Take 1 capsule (40 mg total) by mouth daily. After lunch 90 capsule 3   ondansetron (ZOFRAN) 4 MG tablet Take 1 tablet (4 mg total) by mouth every 8 (eight) hours as needed for nausea or vomiting. 20 tablet 0   oxybutynin (DITROPAN) 5 MG tablet Take 1 tablet (5 mg total) by mouth every 8 (eight) hours as needed for bladder spasms. 30 tablet 0   polyethylene glycol powder (GLYCOLAX/MIRALAX) 17 GM/SCOOP powder Take 17 g by mouth daily as needed for moderate constipation. 255 g 11   potassium chloride SA (KLOR-CON M) 20 MEQ tablet Take 1 tablet (20 mEq total) by mouth 2 (two) times daily.     pregabalin (LYRICA) 150 MG capsule Take 150 mg by mouth at bedtime.     pregabalin (LYRICA) 75 MG capsule Take 75 mg by mouth every morning.     sacubitril-valsartan (ENTRESTO) 24-26 MG Take 1 tablet by mouth 2 (two) times daily. 60 tablet 3   traZODone (DESYREL) 100 MG tablet Take 100 mg by mouth at bedtime.     vitamin B-12 (CYANOCOBALAMIN) 1000 MCG  tablet Take 1 tablet (1,000 mcg total) by mouth daily. 90 tablet 3   White Petrolatum-Mineral Oil (ARTIFICIAL TEARS) ointment Place 1 drop into both eyes daily. At bedtime     ascorbic acid (VITAMIN C) 500 MG tablet Take 500 mg by mouth daily. (Patient not taking: Reported on 06/19/2022)     Biotin 1 MG CAPS Take 1,000 mcg by mouth daily. (Patient not taking: Reported on 06/19/2022)     torsemide (DEMADEX) 20 MG tablet Take 20 mg by mouth daily. (Patient not taking: Reported on 06/19/2022)     No current facility-administered medications for this visit.     Family History    Family History  Problem Relation Age of Onset   Heart disease Mother    Hypertension Mother    Diabetes Mother    Heart attack Mother 62   Heart disease Father    Heart attack Father 27   Breast cancer Maternal Aunt    Heart attack Brother    She indicated that her mother is deceased. She indicated that her father is deceased. She indicated that her brother is deceased. She indicated that her maternal aunt is deceased.  Social History    Social History   Socioeconomic History   Marital status: Widowed    Spouse name: Not on file   Number of children: Not on file   Years of education: Not on file   Highest education level: Not on file  Occupational History   Not on file  Tobacco Use   Smoking status: Never    Passive exposure: Yes   Smokeless tobacco: Never   Tobacco comments:    husbands and children smoked in home.  Vaping Use   Vaping Use: Never used  Substance and Sexual Activity   Alcohol use: No   Drug use: No   Sexual activity: Never  Other Topics Concern   Not on file  Social History Narrative   Widowed    Lives at [facility]   Has kids and grandson    Social Determinants of Health   Financial Resource Strain: Low Risk  (07/24/2020)   Overall Financial Resource Strain (CARDIA)    Difficulty of Paying Living Expenses: Not hard at all  Food Insecurity: No Food Insecurity  (05/15/2022)   Hunger Vital Sign    Worried About Running Out of Food in the Last Year: Never true    Ran Out of Food in the Last Year: Never true  Transportation Needs: No Transportation Needs (05/15/2022)   PRAPARE - Hydrologist (Medical): No    Lack of Transportation (Non-Medical): No  Physical Activity: Unknown (07/24/2020)   Exercise Vital Sign    Days of Exercise per Week: 0 days    Minutes of Exercise per Session: Not on file  Stress: No Stress Concern Present (07/24/2020)   [place]    Feeling of Stress : Only a little  Social Connections: Unknown (07/24/2020)   Social Connection and Isolation Panel [NHANES]    Frequency of Communication with Friends and Family: More than three times a week    Frequency of Social Gatherings with Friends and Family: More than three times a week    Attends Religious Services: Not on file    Active Member of Clubs or Organizations: Not on file    Attends Archivist Meetings: Not on file    Marital Status: Not on file  Intimate Partner Violence: Not At Risk (05/15/2022)   Humiliation, Afraid, Rape, and Kick questionnaire    Fear of Current or Ex-Partner: No    Emotionally Abused: No    Physically Abused: No    Sexually Abused: No     Review of Systems    General:  No chills, fever, night sweats or weight changes.  Endorses fatigue Cardiovascular:  No chest pain, endorses chronic dyspnea on exertion, endorses peripheral edema, but denies orthopnea, palpitations, paroxysmal nocturnal dyspnea. Dermatological: No rash, lesions/masses Respiratory: No cough, endorses chronic dyspnea Urologic: No hematuria, dysuria Abdominal:   No nausea, vomiting, diarrhea, bright red blood per rectum, melena, or hematemesis Neurologic:  No visual changes, wkns, changes in mental status. All other systems reviewed and are otherwise negative except as noted  above.   Physical Exam    VS:  BP 110/80 (BP Location: Left Arm, Patient Position: Sitting, Cuff Size: Large)   Pulse 67   Ht _0  (1.626 m)   Wt 190 lb (86.2 kg)   SpO2 96%   BMI 32.61 kg/m  , BMI Body mass index is 32.61 kg/m.     GEN: Well nourished, well developed, in no acute distress. HEENT: normal. Neck: Supple, no JVD, carotid bruits, or masses. Cardiac: RRR, no murmurs, rubs, or gallops. No clubbing, cyanosis, trace pretibial edema, compression stockings on bilaterally.  Radials/DP/PT 2+ and equal bilaterally.  Respiratory:  Respirations regular and unlabored, clear to auscultation bilaterally. GI: Soft, nontender, nondistended, BS + x 4. MS: no deformity or atrophy. Skin: warm and dry, no rash. Neuro:  Strength and sensation are intact. Psych: Normal affect.  Accessory Clinical Findings    ECG personally reviewed by me  today-sinus rhythm with a rate of 67, nonspecific T wave changes, and LVH, left axis deviation- No acute changes  Lab Results  Component Value Date   WBC 6.1 05/16/2022   HGB 12.3 05/16/2022   HCT 36.3 05/16/2022   MCV 92.8 05/16/2022   PLT 143 (L) 05/16/2022   Lab Results  Component Value Date   CREATININE 0.88 05/16/2022   BUN 18 05/16/2022   NA 139 05/16/2022   K 3.4 (L) 05/16/2022   CL 107 05/16/2022   CO2 24 05/16/2022   Lab Results  Component Value Date   ALT 18 05/14/2022   AST 34 05/14/2022   ALKPHOS 68 05/14/2022   BILITOT 1.0 05/14/2022   Lab Results  Component Value Date   CHOL 125 11/24/2019   HDL 46.40 11/24/2019   LDLCALC 47 11/24/2019   TRIG 158.0 (H) 11/24/2019   CHOLHDL 3 11/24/2019    Lab Results  Component Value Date   HGBA1C 5.1 11/24/2019    Assessment & Plan   1.  HFrEF/NICM with exertional dyspnea and peripheral edema.  She is continue to follow with Darylene Price at heart failure clinic and was recently started on Entresto 24/26 mg twice daily.  She has been scheduled for BMP in 2 weeks to follow-up on  electrolyte and kidney function after the initiation of Arni therapy.  She remains on midodrine for history of hypotension which had been limiting escalation of GDMT.  Diuretics of torsemide have been discontinued with initiation of Entresto.  We will repeat at minimum limited echocardiogram in 3 months after initiation of ARNI.  2.  Nonobstructive coronary artery disease and currently remaining pain-free.  Left heart cath 08/2021 revealed nonobstructive CAD.  EKG without ischemic change today.  No further ischemic work-up indicated.  Continue aspirin, statin, beta-blocker therapy.  3.  Hypertension with episodic episodes of hypotension.  Blood pressure today is 110/80.  She is continued on Entresto 24/26 mg bid.  She is also due remains on midodrine for her episodes of hypotension.  4.  Hyperlipidemia continued on atorvastatin 20 mg daily.  Awaiting current lipid panel from PCP.  5.  Peripheral edema to bilateral lower extremities.  She has been advised to continue with conservative therapy of decreasing sodium intake (without adding additional salt to foods as being at a facility it is hard to regulate sodium within males), compression stockings, elevating extremities, foot Pumps.  6.  Disposition patient return to clinic to see MD/APP in 2 months or sooner if needed  Narjis Mira, NP 06/21/2022, 9:08 AM

## 2022-06-18 ENCOUNTER — Encounter: Payer: Self-pay | Admitting: Urology

## 2022-06-18 ENCOUNTER — Ambulatory Visit: Payer: Medicare HMO | Admitting: Urology

## 2022-06-18 DIAGNOSIS — R262 Difficulty in walking, not elsewhere classified: Secondary | ICD-10-CM | POA: Diagnosis not present

## 2022-06-18 DIAGNOSIS — M6281 Muscle weakness (generalized): Secondary | ICD-10-CM | POA: Diagnosis not present

## 2022-06-18 DIAGNOSIS — F419 Anxiety disorder, unspecified: Secondary | ICD-10-CM | POA: Diagnosis not present

## 2022-06-18 DIAGNOSIS — M545 Low back pain, unspecified: Secondary | ICD-10-CM | POA: Diagnosis not present

## 2022-06-19 ENCOUNTER — Encounter: Payer: Self-pay | Admitting: Family

## 2022-06-19 ENCOUNTER — Ambulatory Visit: Payer: Medicare HMO | Attending: Family | Admitting: Family

## 2022-06-19 VITALS — BP 144/70 | HR 53 | Resp 18 | Wt 192.0 lb

## 2022-06-19 DIAGNOSIS — E559 Vitamin D deficiency, unspecified: Secondary | ICD-10-CM | POA: Diagnosis not present

## 2022-06-19 DIAGNOSIS — R531 Weakness: Secondary | ICD-10-CM | POA: Insufficient documentation

## 2022-06-19 DIAGNOSIS — R0602 Shortness of breath: Secondary | ICD-10-CM | POA: Insufficient documentation

## 2022-06-19 DIAGNOSIS — I10 Essential (primary) hypertension: Secondary | ICD-10-CM

## 2022-06-19 DIAGNOSIS — I2510 Atherosclerotic heart disease of native coronary artery without angina pectoris: Secondary | ICD-10-CM | POA: Diagnosis not present

## 2022-06-19 DIAGNOSIS — I272 Pulmonary hypertension, unspecified: Secondary | ICD-10-CM | POA: Diagnosis not present

## 2022-06-19 DIAGNOSIS — J90 Pleural effusion, not elsewhere classified: Secondary | ICD-10-CM | POA: Insufficient documentation

## 2022-06-19 DIAGNOSIS — Z9981 Dependence on supplemental oxygen: Secondary | ICD-10-CM | POA: Diagnosis not present

## 2022-06-19 DIAGNOSIS — I130 Hypertensive heart and chronic kidney disease with heart failure and stage 1 through stage 4 chronic kidney disease, or unspecified chronic kidney disease: Secondary | ICD-10-CM | POA: Diagnosis not present

## 2022-06-19 DIAGNOSIS — K219 Gastro-esophageal reflux disease without esophagitis: Secondary | ICD-10-CM | POA: Diagnosis not present

## 2022-06-19 DIAGNOSIS — Z79899 Other long term (current) drug therapy: Secondary | ICD-10-CM | POA: Diagnosis not present

## 2022-06-19 DIAGNOSIS — N3090 Cystitis, unspecified without hematuria: Secondary | ICD-10-CM | POA: Insufficient documentation

## 2022-06-19 DIAGNOSIS — F32A Depression, unspecified: Secondary | ICD-10-CM | POA: Diagnosis not present

## 2022-06-19 DIAGNOSIS — Z8673 Personal history of transient ischemic attack (TIA), and cerebral infarction without residual deficits: Secondary | ICD-10-CM | POA: Insufficient documentation

## 2022-06-19 DIAGNOSIS — F5101 Primary insomnia: Secondary | ICD-10-CM | POA: Diagnosis not present

## 2022-06-19 DIAGNOSIS — N189 Chronic kidney disease, unspecified: Secondary | ICD-10-CM | POA: Insufficient documentation

## 2022-06-19 DIAGNOSIS — E079 Disorder of thyroid, unspecified: Secondary | ICD-10-CM | POA: Diagnosis not present

## 2022-06-19 DIAGNOSIS — Z7982 Long term (current) use of aspirin: Secondary | ICD-10-CM | POA: Insufficient documentation

## 2022-06-19 DIAGNOSIS — F4323 Adjustment disorder with mixed anxiety and depressed mood: Secondary | ICD-10-CM | POA: Diagnosis not present

## 2022-06-19 DIAGNOSIS — R262 Difficulty in walking, not elsewhere classified: Secondary | ICD-10-CM | POA: Diagnosis not present

## 2022-06-19 DIAGNOSIS — I25118 Atherosclerotic heart disease of native coronary artery with other forms of angina pectoris: Secondary | ICD-10-CM

## 2022-06-19 DIAGNOSIS — I5022 Chronic systolic (congestive) heart failure: Secondary | ICD-10-CM | POA: Insufficient documentation

## 2022-06-19 DIAGNOSIS — M6281 Muscle weakness (generalized): Secondary | ICD-10-CM | POA: Diagnosis not present

## 2022-06-19 DIAGNOSIS — E785 Hyperlipidemia, unspecified: Secondary | ICD-10-CM | POA: Insufficient documentation

## 2022-06-19 DIAGNOSIS — F419 Anxiety disorder, unspecified: Secondary | ICD-10-CM | POA: Insufficient documentation

## 2022-06-19 MED ORDER — SACUBITRIL-VALSARTAN 24-26 MG PO TABS: 1.0000 | ORAL_TABLET | Freq: Two times a day (BID) | ORAL | 3 refills | Status: DC

## 2022-06-19 NOTE — Patient Instructions (Addendum)
Continue weighing daily and call for an overnight weight gain of 3 pounds or more or a weekly weight gain of more than 5 pounds.   Start entresto 24/26mg  as 1 tablet twice daily

## 2022-06-19 NOTE — Progress Notes (Unsigned)
Patient ID: Kimberly Kimberly Cook Kimberly Cook, female    DOB: 1945/12/10, 76 y.o.   MRN: [id number]  HPI  Kimberly Kimberly Cook Kimberly Cook is a 76 y/o female with a history of CAD, hyperlipidemia, HTN, CKD, stroke, syncope, thyroid disease, anxiety, DDD, depression, diverticulosis, GERD, occipital neuralgia, vitamin D deficiency and chronic heart failure.   Echo report from 03/01/22 reviewed and showed an EF of 45-50%. Echo report from 10/05/21 reviewed and showed an EF of 30-35% with small/moderate pleural effusion. Echo report from 08/26/21 reviewed and showed an EF of 30-35% along with moderate LAE and moderate MR.   RHC/LHC done 08/28/21 and showed: Prox RCA lesion is 20% stenosed.   1.  Mild nonobstructive coronary artery disease. 2.  Left ventricular angiography was not performed.  EF was 30 to 35% by echo. 3.  Right heart catheterization showed mildly elevated right and left filling pressures, mild pulmonary hypertension and mildly reduced cardiac output.  Admitted 05/14/22 due to nausea vomiting and urinary burning. Antibiotics started. Discharged after 2 days. Admitted 04/11/22 due to syncope with subsequent fall. Recently diagnosed with UTI. Right periorbital hematoma and a laceration that was repaired in the ER. Carotid doppler U/S negative for significant stenosis of ICA's bilaterally. Neurosurgery and palliative consults obtained. HF meds held due to orthostasis. Discharged after 6 days. Admitted 03/02/22 due to confusion. UA + for UTI so given antibiotics. Discharged after 4 days. Was in the ED 02/01/22 due to increased urinary frequency. IVF and antibiotics given and she was released. Had 2 other ED visits the month of June.   She presents today for a follow-up visit with a chief complaint of moderate shortness of breath with minimal exertion. Describes this as chronic in nature. She has associated fatigue, chest tightness, cough, pedal edema, headaches, light-headedness, weakness, depression (worsening), anxiety and slight  weight gain along with this. She denies any difficulty sleeping, abdominal distention, palpitations or chest pain.   Now wearing compression socks and feels like her swelling is better. Feels like her depression is worsening because of her weight gain. She says that she's not eating very much although does get served "a lot of carbs" at the facility. Admits to not being very active because of her weakness and she says that the staff doesn't want her getting up by hersellf.   Past Medical History:  Diagnosis Date   Acute respiratory failure, unsp w hypoxia or hypercapnia (HCC)    Allergy    Anxiety    a.) on BZO (alprazolam) PRN   Aortic atherosclerosis (HCC)    Arthritis    Back pain    Chronic HFrEF (heart failure with reduced ejection fraction) ([place])    a. 08/2021 echo: EF 30-35%, glob HK, GrI DD; b. 10/2021 Echo: EF 30-35%.   CKD (chronic kidney disease), stage II    Coronary artery disease    a. Mild to moderate CAD in LAD/diagonal by CTA (CT-FFR of apical LAD 0.79); b. 08/2021 Cath: LM nl, LAD min irregs, D1/2/3 nl, LCX nl, OM2/3 nl, RCA 20p, RPDA mild dzs, RPAV nl.   DDD (degenerative disc disease), lumbar    Depression    Diverticulosis    Essential hypertension    GERD (gastroesophageal reflux disease)    Headache    Hiatal hernia    History of shingles    HLD (hyperlipidemia)    Hypothyroidism    Lung nodule    Mini stroke 2011   Moderate Pericardial effusion    a. 08/2021 Echo: moderate circumferential pericardial  effusion w/o tamponade; b. 10/2021 Echo: EF 30-35%, small to mod circumferential pericardial effusion w/o tamponade.   Muscle weakness    NICM (nonischemic cardiomyopathy) ([place])    a. 08/2021 Echo: EF 30-35%, glob HK, GrI DD, nl RV fxn, mild-mod dil LA, mod circumferential pericardial eff w/o tamponade, Mod MR; b. 10/2021 Echo: EF 30-35%, glob HK.   Occipital neuralgia    On supplemental oxygen by nasal cannula    a.) 2L.[place] at bedtime and PRN   Palpitations     Pancytopenia (HCC)    Pleural effusion on left    a. 08/2021 s/p thoracentesis.   Pneumonia 2018   Polyneuropathy    Prediabetes    Renal cyst, left    a.) Renal artery [place] 12/26/2017: measured 2.1 cm.   Sleep difficulties    a.) takes trazodone + melatonin   Stroke ([place])    MRI 04/2008 + left sup. frontal gyrus possibly puntate infarct    Syncope 2019   Vitamin B deficiency    Vitamin D deficiency    Past Surgical History:  Procedure Laterality Date   ABDOMINAL HYSTERECTOMY     BLADDER SURGERY     2003   BREAST EXCISIONAL BIOPSY Right Over 20 years    Benign   CHOLECYSTECTOMY     CYSTOSCOPY W/ URETERAL STENT PLACEMENT Left 05/13/2022   Procedure: CYSTOSCOPY WITH RETROGRADE PYELOGRAM/URETERAL LEFT STENT PLACEMENT;  Surgeon: Hollice Espy, MD;  Location: [facility];  Service: Urology;  Laterality: Left;   CYSTOSCOPY WITH BIOPSY N/A 05/13/2022   Procedure: CYSTOSCOPY WITH BLADDER BIOPSY;  Surgeon: Hollice Espy, MD;  Location: [facility];  Service: Urology;  Laterality: N/A;   gastroplication      KNEE ARTHROSCOPY Left 2011   PULSE GENERATOR IMPLANT Left 01/31/2020   Procedure: PLACEMENT RIGHT FLANK PULSE GENERATOR VS REMOVAL SPINAL CORD STIMULATOR;  Surgeon: Deetta Perla, MD;  Location: [facility];  Service: Neurosurgery;  Laterality: Left;   PULSE GENERATOR IMPLANT Left 04/24/2020   Procedure: REPLACEMENT LEFT FLANK PULSE GENERATOR IMPLANT;  Surgeon: Deetta Perla, MD;  Location: [facility];  Service: Neurosurgery;  Laterality: Left;  MAC w/ local   REPLACEMENT TOTAL KNEE Left    right arm fracture     RIGHT/LEFT HEART CATH AND CORONARY ANGIOGRAPHY N/A 08/28/2021   Procedure: RIGHT/LEFT HEART CATH AND CORONARY ANGIOGRAPHY;  Surgeon: Wellington Hampshire, MD;  Location: [place] [facility];  Service: Cardiovascular;  Laterality: N/A;   SPINAL CORD STIMULATOR REMOVAL N/A 06/26/2020   Procedure: SPINAL CORD STIMULATOR REMOVAL;  Surgeon: Deetta Perla, MD;  Location: [facility];  Service:  Neurosurgery;  Laterality: N/A;   THORACIC LAMINECTOMY FOR SPINAL CORD STIMULATOR N/A 01/24/2020   Procedure: THORACIC SPINAL CORD STIMULATOR PADDLE TRIAL VIA LAMINECTOMY;  Surgeon: Deetta Perla, MD;  Location: [facility];  Service: Neurosurgery;  Laterality: N/A;   TONSILLECTOMY AND ADENOIDECTOMY     TOTAL KNEE ARTHROPLASTY Left 04/17/2015   Procedure: LEFT TOTAL KNEE ARTHROPLASTY;  Surgeon: Paralee Cancel, MD;  Location: [facility];  Service: Orthopedics;  Laterality: Left;   Family History  Problem Relation Age of Onset   Heart disease Mother    Hypertension Mother    Diabetes Mother    Heart attack Mother 15   Heart disease Father    Heart attack Father 85   Breast cancer Maternal Aunt    Heart attack Brother    Social History   Tobacco Use   Smoking status: Never    Passive exposure: Yes   Smokeless tobacco:  Never   Tobacco comments:    husbands and children smoked in home.   Substance Use Topics   Alcohol use: No   No Known Allergies  Prior to Admission medications   Medication Sig Start Date End Date Taking? Authorizing Provider  acetaminophen (TYLENOL) 500 MG tablet Take 1,000 mg by mouth every 8 (eight) hours as needed for moderate pain.   Yes [provider]  alendronate (FOSAMAX) 70 MG tablet Take 70 mg by mouth once a week.  10/23/19  Yes [provider]  ALPRAZolam Duanne Moron) 0.5 MG tablet Take 1 tablet (0.5 mg total) by mouth 2 (two) times daily as needed for anxiety. Patient taking differently: Take 0.5 mg by mouth 3 (three) times daily as needed for anxiety. 03/06/22  Yes Wieting, Richard, MD  aspirin EC 81 MG tablet Take 1 tablet (81 mg total) by mouth at bedtime. Swallow whole. 10/25/20  Yes McLean-Scocuzza, Nino Glow, MD  atorvastatin (LIPITOR) 20 MG tablet Take 1 tablet (20 mg total) by mouth daily. 06/20/20  Yes McLean-Scocuzza, Nino Glow, MD  Carboxymethylcellulose Sodium (REFRESH LIQUIGEL) 1 % GEL Place 1 drop into both eyes at bedtime.   Yes [provider]  cetirizine (ZYRTEC) 5 MG tablet Take 5 mg by mouth daily.   Yes [provider]  Cholecalciferol (VITAMIN D3) 125 MCG (5000 UT) TABS Take 1 tablet (5,000 Units total) by mouth daily. 10/09/18  Yes McLean-Scocuzza, Nino Glow, MD  citalopram (CELEXA) 20 MG tablet Take 20 mg by mouth daily. 01/10/22  Yes [provider]  Cranberry 450 MG TABS Take 1 tablet by mouth every morning. 01/03/22  Yes [provider]  HYDROcodone-acetaminophen (NORCO/VICODIN) 5-325 MG tablet Take 1 tablet by mouth every 6 (six) hours as needed for moderate pain. 05/13/22  Yes Hollice Espy, MD  hydrocortisone cream 1 % Apply 1 Application topically 2 (two) times daily as needed for itching.   Yes [provider]  levothyroxine (SYNTHROID) 100 MCG tablet Take 1 tablet (100 mcg total) by mouth daily before breakfast. Patient taking differently: Take 100 mcg by mouth daily. At bedtime 03/06/22  Yes Wieting, Richard, MD  melatonin 5 MG TABS Take 1 tablet (5 mg total) by mouth at bedtime. 04/04/21  Yes Val Riles, MD  midodrine (PROAMATINE) 10 MG tablet Take 1 tablet (10 mg total) by mouth 3 (three) times daily with meals. 04/17/22  Yes Wouk, Ailene Rud, MD  montelukast (SINGULAIR) 10 MG tablet Take 1 tablet (10 mg total) by mouth at bedtime. 02/14/20  Yes McLean-Scocuzza, Nino Glow, MD  nystatin cream (MYCOSTATIN) Apply 1 Application topically 2 (two) times daily. 05/21/22  Yes Hollice Espy, MD  omeprazole (PRILOSEC) 40 MG capsule Take 1 capsule (40 mg total) by mouth daily. After lunch 06/23/20  Yes McLean-Scocuzza, Nino Glow, MD  ondansetron (ZOFRAN) 4 MG tablet Take 1 tablet (4 mg total) by mouth every 8 (eight) hours as needed for nausea or vomiting. 05/16/22  Yes [name], MD  oxybutynin (DITROPAN) 5 MG tablet Take 1 tablet (5 mg total) by mouth every 8 (eight) hours as needed for bladder spasms. 05/13/22  Yes Hollice Espy, MD  polyethylene glycol powder (GLYCOLAX/MIRALAX) 17  GM/SCOOP powder Take 17 g by mouth daily as needed for moderate constipation. 10/21/19  Yes McLean-Scocuzza, Nino Glow, MD  potassium chloride SA (KLOR-CON M) 20 MEQ tablet Take 1 tablet (20 mEq total) by mouth 2 (two) times daily. 08/30/21  Yes Nicole Kindred A, DO  pregabalin (LYRICA) 150  MG capsule Take 150 mg by mouth at bedtime.   Yes [provider]  pregabalin (LYRICA) 75 MG capsule Take 75 mg by mouth every morning.   Yes [provider]  sacubitril-valsartan (ENTRESTO) 24-26 MG Take 1 tablet by mouth 2 (two) times daily. 06/19/22  Yes Jaimie Pippins, Otila Kluver A, FNP  traZODone (DESYREL) 100 MG tablet Take 100 mg by mouth at bedtime. 12/13/21  Yes [provider]  Jay Schlichter Oil (ARTIFICIAL TEARS) ointment Place 1 drop into both eyes daily. At bedtime   Yes [provider]  ascorbic acid (VITAMIN C) 500 MG tablet Take 500 mg by mouth daily. Patient not taking: Reported on 06/19/2022    [provider]  Biotin 1 MG CAPS Take 1,000 mcg by mouth daily. Patient not taking: Reported on 06/19/2022    [provider]  folic acid (FOLVITE) 846 MCG tablet Take 1 tablet (400 mcg total) by mouth daily. SEPARATE ALL SUPPLEMENTS TO LUNCH OR DINNER AND PRILOSEC NOT TO MESS W/THYROID MED 12/10/18   McLean-Scocuzza, Nino Glow, MD  Multiple Vitamin (MULTIVITAMIN WITH MINERALS) TABS tablet Take 1 tablet by mouth daily.    [provider]  torsemide (DEMADEX) 20 MG tablet Take 20 mg by mouth daily. Patient not taking: Reported on 06/19/2022    [provider]  vitamin B-12 (CYANOCOBALAMIN) 1000 MCG tablet Take 1 tablet (1,000 mcg total) by mouth daily. 10/09/18   McLean-Scocuzza, Nino Glow, MD    'Review of Systems  Constitutional:  Positive for fatigue. Negative for appetite change.  HENT:  Negative for congestion, postnasal drip and sore throat.   Eyes: Negative.   Respiratory:  Positive for cough (dry), chest tightness (over sternum) and  shortness of breath (easily).   Cardiovascular:  Positive for leg swelling (left ankle). Negative for chest pain and palpitations.  Gastrointestinal:  Negative for abdominal distention and abdominal pain.  Endocrine: Negative.   Genitourinary: Negative.   Musculoskeletal:  Positive for arthralgias (right shoulder/ left knee) and back pain (chronic due to 4 rods in back). Negative for neck pain.  Skin: Negative.   Allergic/Immunologic: Negative.   Neurological:  Positive for weakness, light-headedness and headaches. Negative for dizziness.  Hematological:  Negative for adenopathy. Does not bruise/bleed easily.  Psychiatric/Behavioral:  Positive for dysphoric mood (worsening). Negative for sleep disturbance (sleeping on 1 pillow). The patient is nervous/anxious.    Vitals:   06/19/22 1240  BP: (!) 144/70  Pulse: (!) 53  Resp: 18  SpO2: 94%  Weight: 192 lb (87.1 kg)   Wt Readings from Last 3 Encounters:  06/19/22 192 lb (87.1 kg)  05/21/22 177 lb (80.3 kg)  05/15/22 177 lb 7.5 oz (80.5 kg)   Lab Results  Component Value Date   CREATININE 0.88 05/16/2022   CREATININE 1.01 (H) 05/15/2022   CREATININE 1.00 05/14/2022   Physical Exam Vitals and nursing note reviewed.  Constitutional:      Appearance: She is well-developed.  HENT:     Head: Normocephalic and atraumatic.  Cardiovascular:     Rate and Rhythm: Regular rhythm. Bradycardia present.  Pulmonary:     Effort: Pulmonary effort is normal. No respiratory distress.     Breath sounds: No wheezing or rales.  Abdominal:     General: There is no distension.     Palpations: Abdomen is soft.     Tenderness: There is no abdominal tenderness.  Musculoskeletal:     Cervical back: Normal range of motion and neck supple.  Right lower leg: No edema.     Left lower leg: No tenderness. Edema (trace pitting) present.  Skin:    General: Skin is warm and dry.  Neurological:     General: No focal deficit present.     Mental Status:  She is alert and oriented to person, place, and time.  Psychiatric:        Mood and Affect: Mood normal.        Behavior: Behavior normal.     Assessment & Plan:  1: Chronic heart failure with reduced ejection fraction- - NYHA class III - euvolemic today - being weighed daily; reminded to call for an overnight weight gain of > 2 pounds or a weekly weight gain of >5 pounds - weight up 2 pounds from last visit here 2 months ago - currently not adding salt except to grits - will start entresto 24/42m BID and titrate/ add other GDMT slowly due to history of hypotension - check BMP next visit - has recurrent cystitis so not a good candidate for SGLT2 - bradycardic so unable to add beta-blocker - does wear oxygen at 2L at bedtime and PRN during the day - wearing compression socks daily and edema has improved - BNP 04/16/22 was 159.1  2: HTN- - BP mildly elevated (144/70) - [facility] and showed sodium 139, potassium 3.4, creatinine 0.88 and GFR >60  3: CAD- - saw cardiology ([place]) 03/12/22 - currently taking aspirin, atorvastatin  4: Recurrent cystitis- - saw urology during recent admission - renal ultrasound has been done - has had bladder biopsy done   Facility medication list reviewed.   Return in 1 month, sooner if needed.

## 2022-06-20 ENCOUNTER — Encounter: Payer: Self-pay | Admitting: Family

## 2022-06-20 DIAGNOSIS — M6281 Muscle weakness (generalized): Secondary | ICD-10-CM | POA: Diagnosis not present

## 2022-06-20 DIAGNOSIS — R262 Difficulty in walking, not elsewhere classified: Secondary | ICD-10-CM | POA: Diagnosis not present

## 2022-06-20 DIAGNOSIS — I509 Heart failure, unspecified: Secondary | ICD-10-CM | POA: Diagnosis not present

## 2022-06-21 ENCOUNTER — Ambulatory Visit: Payer: Medicare HMO | Attending: Cardiology | Admitting: Cardiology

## 2022-06-21 ENCOUNTER — Encounter: Payer: Self-pay | Admitting: Cardiology

## 2022-06-21 VITALS — BP 110/80 | HR 67 | Ht 64.0 in | Wt 190.0 lb

## 2022-06-21 DIAGNOSIS — I25118 Atherosclerotic heart disease of native coronary artery with other forms of angina pectoris: Secondary | ICD-10-CM | POA: Diagnosis not present

## 2022-06-21 DIAGNOSIS — R609 Edema, unspecified: Secondary | ICD-10-CM | POA: Diagnosis not present

## 2022-06-21 DIAGNOSIS — I5022 Chronic systolic (congestive) heart failure: Secondary | ICD-10-CM | POA: Diagnosis not present

## 2022-06-21 DIAGNOSIS — E782 Mixed hyperlipidemia: Secondary | ICD-10-CM

## 2022-06-21 DIAGNOSIS — R262 Difficulty in walking, not elsewhere classified: Secondary | ICD-10-CM | POA: Diagnosis not present

## 2022-06-21 DIAGNOSIS — I10 Essential (primary) hypertension: Secondary | ICD-10-CM | POA: Diagnosis not present

## 2022-06-21 DIAGNOSIS — I428 Other cardiomyopathies: Secondary | ICD-10-CM

## 2022-06-21 DIAGNOSIS — M6281 Muscle weakness (generalized): Secondary | ICD-10-CM | POA: Diagnosis not present

## 2022-06-21 NOTE — Patient Instructions (Addendum)
Monitor daily weights, repeat labs, and fax results to [phone]. Follow up in 2 months.   Medication Instructions:  No changes at this time.   *If you need a refill on your cardiac medications before your next appointment, please call your pharmacy*   Lab Work: BMET in 2  weeks. If not able to be done at facility then we will need you to have those at [facility]. No appointment is needed and location below: [facility] Entrance at [facility] 1st desk on the right to check in (REGISTRATION)  Lab hours: Monday- Friday (7:30 am- 5:30 pm)  If you have labs (blood work) drawn today and your tests are completely normal, you will receive your results only by: MyChart Message (if you have MyChart) OR A paper copy in the mail If you have any lab test that is abnormal or we need to change your treatment, we will call you to review the results.   Testing/Procedures: None   Follow-Up: At [facility], you and your health needs are our priority.  As part of our continuing mission to provide you with exceptional heart care, we have created designated Provider Care Teams.  These Care Teams include your primary Cardiologist (physician) and Advanced Practice Providers (APPs -  Physician Assistants and Nurse Practitioners) who all work together to provide you with the care you need, when you need it.   Your next appointment:   2 month(s)  The format for your next appointment:   In Person  Provider:   Yvonne Kendall, MD or Charlsie Quest, NP        Important Information About Sugar

## 2022-07-01 DIAGNOSIS — F4323 Adjustment disorder with mixed anxiety and depressed mood: Secondary | ICD-10-CM | POA: Diagnosis not present

## 2022-07-01 DIAGNOSIS — F5101 Primary insomnia: Secondary | ICD-10-CM | POA: Diagnosis not present

## 2022-07-02 DIAGNOSIS — R300 Dysuria: Secondary | ICD-10-CM | POA: Diagnosis not present

## 2022-07-02 DIAGNOSIS — N390 Urinary tract infection, site not specified: Secondary | ICD-10-CM | POA: Diagnosis not present

## 2022-07-02 DIAGNOSIS — R109 Unspecified abdominal pain: Secondary | ICD-10-CM | POA: Diagnosis not present

## 2022-07-03 DIAGNOSIS — F5101 Primary insomnia: Secondary | ICD-10-CM | POA: Diagnosis not present

## 2022-07-03 DIAGNOSIS — F4323 Adjustment disorder with mixed anxiety and depressed mood: Secondary | ICD-10-CM | POA: Diagnosis not present

## 2022-07-04 ENCOUNTER — Ambulatory Visit: Admit: 2022-07-04 | Payer: Medicare HMO | Admitting: Ophthalmology

## 2022-07-04 DIAGNOSIS — Z1612 Extended spectrum beta lactamase (ESBL) resistance: Secondary | ICD-10-CM | POA: Diagnosis not present

## 2022-07-04 DIAGNOSIS — F419 Anxiety disorder, unspecified: Secondary | ICD-10-CM | POA: Diagnosis not present

## 2022-07-04 DIAGNOSIS — N390 Urinary tract infection, site not specified: Secondary | ICD-10-CM | POA: Diagnosis not present

## 2022-07-04 DIAGNOSIS — F32A Depression, unspecified: Secondary | ICD-10-CM | POA: Diagnosis not present

## 2022-07-04 SURGERY — PHACOEMULSIFICATION, CATARACT, WITH IOL INSERTION
Anesthesia: Topical | Laterality: Right

## 2022-07-08 DIAGNOSIS — N390 Urinary tract infection, site not specified: Secondary | ICD-10-CM | POA: Diagnosis not present

## 2022-07-16 DIAGNOSIS — R5383 Other fatigue: Secondary | ICD-10-CM | POA: Diagnosis not present

## 2022-07-16 DIAGNOSIS — R059 Cough, unspecified: Secondary | ICD-10-CM | POA: Diagnosis not present

## 2022-07-16 DIAGNOSIS — F32A Depression, unspecified: Secondary | ICD-10-CM | POA: Diagnosis not present

## 2022-07-16 DIAGNOSIS — N390 Urinary tract infection, site not specified: Secondary | ICD-10-CM | POA: Diagnosis not present

## 2022-07-16 DIAGNOSIS — R55 Syncope and collapse: Secondary | ICD-10-CM | POA: Diagnosis not present

## 2022-07-17 ENCOUNTER — Ambulatory Visit: Admit: 2022-07-17 | Payer: Medicare HMO | Admitting: Ophthalmology

## 2022-07-17 DIAGNOSIS — I509 Heart failure, unspecified: Secondary | ICD-10-CM | POA: Diagnosis not present

## 2022-07-17 DIAGNOSIS — F4323 Adjustment disorder with mixed anxiety and depressed mood: Secondary | ICD-10-CM | POA: Diagnosis not present

## 2022-07-17 DIAGNOSIS — I428 Other cardiomyopathies: Secondary | ICD-10-CM | POA: Diagnosis not present

## 2022-07-17 DIAGNOSIS — R051 Acute cough: Secondary | ICD-10-CM | POA: Diagnosis not present

## 2022-07-17 DIAGNOSIS — F5101 Primary insomnia: Secondary | ICD-10-CM | POA: Diagnosis not present

## 2022-07-17 SURGERY — PHACOEMULSIFICATION, CATARACT, WITH IOL INSERTION
Anesthesia: Topical | Laterality: Left

## 2022-07-18 ENCOUNTER — Ambulatory Visit: Payer: Medicare HMO | Attending: Family | Admitting: Family

## 2022-07-18 ENCOUNTER — Encounter: Payer: Self-pay | Admitting: Family

## 2022-07-18 VITALS — BP 107/62 | HR 62 | Resp 18 | Wt 190.0 lb

## 2022-07-18 DIAGNOSIS — N390 Urinary tract infection, site not specified: Secondary | ICD-10-CM

## 2022-07-18 DIAGNOSIS — E785 Hyperlipidemia, unspecified: Secondary | ICD-10-CM | POA: Insufficient documentation

## 2022-07-18 DIAGNOSIS — I10 Essential (primary) hypertension: Secondary | ICD-10-CM | POA: Diagnosis not present

## 2022-07-18 DIAGNOSIS — Z8249 Family history of ischemic heart disease and other diseases of the circulatory system: Secondary | ICD-10-CM | POA: Insufficient documentation

## 2022-07-18 DIAGNOSIS — E079 Disorder of thyroid, unspecified: Secondary | ICD-10-CM | POA: Diagnosis not present

## 2022-07-18 DIAGNOSIS — N1831 Chronic kidney disease, stage 3a: Secondary | ICD-10-CM | POA: Diagnosis not present

## 2022-07-18 DIAGNOSIS — K219 Gastro-esophageal reflux disease without esophagitis: Secondary | ICD-10-CM | POA: Insufficient documentation

## 2022-07-18 DIAGNOSIS — R531 Weakness: Secondary | ICD-10-CM | POA: Diagnosis not present

## 2022-07-18 DIAGNOSIS — I5022 Chronic systolic (congestive) heart failure: Secondary | ICD-10-CM | POA: Diagnosis not present

## 2022-07-18 DIAGNOSIS — N189 Chronic kidney disease, unspecified: Secondary | ICD-10-CM | POA: Diagnosis not present

## 2022-07-18 DIAGNOSIS — I2510 Atherosclerotic heart disease of native coronary artery without angina pectoris: Secondary | ICD-10-CM | POA: Insufficient documentation

## 2022-07-18 DIAGNOSIS — N3090 Cystitis, unspecified without hematuria: Secondary | ICD-10-CM | POA: Diagnosis not present

## 2022-07-18 DIAGNOSIS — R5383 Other fatigue: Secondary | ICD-10-CM | POA: Diagnosis not present

## 2022-07-18 DIAGNOSIS — F32A Depression, unspecified: Secondary | ICD-10-CM | POA: Diagnosis not present

## 2022-07-18 DIAGNOSIS — I130 Hypertensive heart and chronic kidney disease with heart failure and stage 1 through stage 4 chronic kidney disease, or unspecified chronic kidney disease: Secondary | ICD-10-CM | POA: Diagnosis not present

## 2022-07-18 DIAGNOSIS — Z7982 Long term (current) use of aspirin: Secondary | ICD-10-CM | POA: Diagnosis not present

## 2022-07-18 DIAGNOSIS — R29898 Other symptoms and signs involving the musculoskeletal system: Secondary | ICD-10-CM | POA: Diagnosis not present

## 2022-07-18 DIAGNOSIS — Z8673 Personal history of transient ischemic attack (TIA), and cerebral infarction without residual deficits: Secondary | ICD-10-CM | POA: Insufficient documentation

## 2022-07-18 DIAGNOSIS — I25118 Atherosclerotic heart disease of native coronary artery with other forms of angina pectoris: Secondary | ICD-10-CM | POA: Diagnosis not present

## 2022-07-18 DIAGNOSIS — R059 Cough, unspecified: Secondary | ICD-10-CM | POA: Diagnosis not present

## 2022-07-18 DIAGNOSIS — F419 Anxiety disorder, unspecified: Secondary | ICD-10-CM | POA: Insufficient documentation

## 2022-07-18 NOTE — Patient Instructions (Signed)
Continue weighing daily and call for an overnight weight gain of 3 pounds or more or a weekly weight gain of more than 5 pounds.  °

## 2022-07-18 NOTE — Progress Notes (Signed)
Patient ID: Kimberly Cook, female    DOB: [birth year], 76 y.o.   MRN: [id number]  HPI  Kimberly Cook is a 76 y/o female with a history of CAD, hyperlipidemia, HTN, CKD, stroke, syncope, thyroid disease, anxiety, DDD, depression, diverticulosis, GERD, occipital neuralgia, vitamin D deficiency and chronic heart failure.   Echo report from 03/01/22 reviewed and showed an EF of 45-50%. Echo report from 10/05/21 reviewed and showed an EF of 30-35% with small/moderate pleural effusion. Echo report from 08/26/21 reviewed and showed an EF of 30-35% along with moderate LAE and moderate MR.   RHC/LHC done 08/28/21 and showed: Prox RCA lesion is 20% stenosed.   1.  Mild nonobstructive coronary artery disease. 2.  Left ventricular angiography was not performed.  EF was 30 to 35% by echo. 3.  Right heart catheterization showed mildly elevated right and left filling pressures, mild pulmonary hypertension and mildly reduced cardiac output.  Admitted 05/14/22 due to nausea vomiting and urinary burning. Antibiotics started. Discharged after 2 days. Admitted 04/11/22 due to syncope with subsequent fall. Recently diagnosed with UTI. Right periorbital hematoma and a laceration that was repaired in the ER. Carotid doppler U/S negative for significant stenosis of ICA's bilaterally. Neurosurgery and palliative consults obtained. HF meds held due to orthostasis. Discharged after 6 days. Admitted 03/02/22 due to confusion. UA + for UTI so given antibiotics. Discharged after 4 days. Was in the ED 02/01/22 due to increased urinary frequency. IVF and antibiotics given and Kimberly Cook was released. Had 2 other ED visits the month of June.   Kimberly Cook presents today for a follow-up visit with a chief complaint of moderate shortness of breath with minimal exertion. Describes this as chronic in nature. Kimberly Cook has associated fatigue, cough, left ankle edema, headaches, light-headedness, anxiety and leg weakness along with this. Kimberly Cook denies any difficulty  sleeping, abdominal distention, palpitations, chest pain or weight gain.   Says that Kimberly Cook got leg work drawn at [facility] yesterday morning. Kimberly Cook says that her biggest complaint is of her leg weakness where Kimberly Cook is scared to walk any because Kimberly Cook feels like her legs are going to give out on her.   Past Medical History:  Diagnosis Date   Acute respiratory failure, unsp w hypoxia or hypercapnia (HCC)    Allergy    Anxiety    a.) on BZO (alprazolam) PRN   Aortic atherosclerosis (HCC)    Arthritis    Back pain    Chronic HFrEF (heart failure with reduced ejection fraction) ([place])    a. 08/2021 echo: EF 30-35%, glob HK, GrI DD; b. 10/2021 Echo: EF 30-35%.   CKD (chronic kidney disease), stage II    Coronary artery disease    a. Mild to moderate CAD in LAD/diagonal by CTA (CT-FFR of apical LAD 0.79); b. 08/2021 Cath: LM nl, LAD min irregs, D1/2/3 nl, LCX nl, OM2/3 nl, RCA 20p, RPDA mild dzs, RPAV nl.   DDD (degenerative disc disease), lumbar    Depression    Diverticulosis    Essential hypertension    GERD (gastroesophageal reflux disease)    Headache    Hiatal hernia    History of shingles    HLD (hyperlipidemia)    Hypothyroidism    Lung nodule    Mini stroke 2011   Moderate Pericardial effusion    a. 08/2021 Echo: moderate circumferential pericardial effusion w/o tamponade; b. 10/2021 Echo: EF 30-35%, small to mod circumferential pericardial effusion w/o tamponade.   Muscle weakness    NICM (nonischemic  cardiomyopathy) ([place])    a. 08/2021 Echo: EF 30-35%, glob HK, GrI DD, nl RV fxn, mild-mod dil LA, mod circumferential pericardial eff w/o tamponade, Mod MR; b. 10/2021 Echo: EF 30-35%, glob HK.   Occipital neuralgia    On supplemental oxygen by nasal cannula    a.) 2L.[place] at bedtime and PRN   Palpitations    Pancytopenia (HCC)    Pleural effusion on left    a. 08/2021 s/p thoracentesis.   Pneumonia 2018   Polyneuropathy    Prediabetes    Renal cyst, left    a.) Renal artery [place] 12/26/2017:  measured 2.1 cm.   Sleep difficulties    a.) takes trazodone + melatonin   Stroke ([place])    MRI 04/2008 + left sup. frontal gyrus possibly puntate infarct    Syncope 2019   Vitamin B deficiency    Vitamin D deficiency    Past Surgical History:  Procedure Laterality Date   ABDOMINAL HYSTERECTOMY     BLADDER SURGERY     2003   BREAST EXCISIONAL BIOPSY Right Over 20 years    Benign   CHOLECYSTECTOMY     CYSTOSCOPY W/ URETERAL STENT PLACEMENT Left 05/13/2022   Procedure: CYSTOSCOPY WITH RETROGRADE PYELOGRAM/URETERAL LEFT STENT PLACEMENT;  Surgeon: Hollice Espy, MD;  Location: [facility];  Service: Urology;  Laterality: Left;   CYSTOSCOPY WITH BIOPSY N/A 05/13/2022   Procedure: CYSTOSCOPY WITH BLADDER BIOPSY;  Surgeon: Hollice Espy, MD;  Location: [facility];  Service: Urology;  Laterality: N/A;   gastroplication      KNEE ARTHROSCOPY Left 2011   PULSE GENERATOR IMPLANT Left 01/31/2020   Procedure: PLACEMENT RIGHT FLANK PULSE GENERATOR VS REMOVAL SPINAL CORD STIMULATOR;  Surgeon: Deetta Perla, MD;  Location: [facility];  Service: Neurosurgery;  Laterality: Left;   PULSE GENERATOR IMPLANT Left 04/24/2020   Procedure: REPLACEMENT LEFT FLANK PULSE GENERATOR IMPLANT;  Surgeon: Deetta Perla, MD;  Location: [facility];  Service: Neurosurgery;  Laterality: Left;  MAC w/ local   REPLACEMENT TOTAL KNEE Left    right arm fracture     RIGHT/LEFT HEART CATH AND CORONARY ANGIOGRAPHY N/A 08/28/2021   Procedure: RIGHT/LEFT HEART CATH AND CORONARY ANGIOGRAPHY;  Surgeon: Wellington Hampshire, MD;  Location: [facility];  Service: Cardiovascular;  Laterality: N/A;   SPINAL CORD STIMULATOR REMOVAL N/A 06/26/2020   Procedure: SPINAL CORD STIMULATOR REMOVAL;  Surgeon: Deetta Perla, MD;  Location: [facility];  Service: Neurosurgery;  Laterality: N/A;   THORACIC LAMINECTOMY FOR SPINAL CORD STIMULATOR N/A 01/24/2020   Procedure: THORACIC SPINAL CORD STIMULATOR PADDLE TRIAL VIA LAMINECTOMY;  Surgeon: Deetta Perla,  MD;  Location: [facility];  Service: Neurosurgery;  Laterality: N/A;   TONSILLECTOMY AND ADENOIDECTOMY     TOTAL KNEE ARTHROPLASTY Left 04/17/2015   Procedure: LEFT TOTAL KNEE ARTHROPLASTY;  Surgeon: Paralee Cancel, MD;  Location: [facility];  Service: Orthopedics;  Laterality: Left;   Family History  Problem Relation Age of Onset   Heart disease Mother    Hypertension Mother    Diabetes Mother    Heart attack Mother 25   Heart disease Father    Heart attack Father 48   Breast cancer Maternal Aunt    Heart attack Brother    Social History   Tobacco Use   Smoking status: Never    Passive exposure: Yes   Smokeless tobacco: Never   Tobacco comments:    husbands and children smoked in home.   Substance Use Topics   Alcohol use: No  No Known Allergies  Prior to Admission medications   Medication Sig Start Date End Date Taking? Authorizing Provider  acetaminophen (TYLENOL) 500 MG tablet Take 1,000 mg by mouth every 8 (eight) hours as needed for moderate pain.   Yes [provider]  alendronate (FOSAMAX) 70 MG tablet Take 70 mg by mouth once a week.  10/23/19  Yes [provider]  ALPRAZolam Duanne Moron) 0.5 MG tablet Take 1 tablet (0.5 mg total) by mouth 2 (two) times daily as needed for anxiety. Patient taking differently: Take 0.5 mg by mouth 3 (three) times daily as needed for anxiety. 03/06/22  Yes Wieting, Richard, MD  aspirin EC 81 MG tablet Take 1 tablet (81 mg total) by mouth at bedtime. Swallow whole. 10/25/20  Yes McLean-Scocuzza, Nino Glow, MD  atorvastatin (LIPITOR) 20 MG tablet Take 1 tablet (20 mg total) by mouth daily. 06/20/20  Yes McLean-Scocuzza, Nino Glow, MD  calcium-vitamin D (OSCAL WITH D) 500-5 MG-MCG tablet Take 1 tablet by mouth 2 (two) times daily.   Yes [provider]  carboxymethylcellulose 1 % ophthalmic solution Apply 1 drop to eye at bedtime.   Yes [provider]  cetirizine (ZYRTEC) 5 MG tablet Take 5 mg by mouth daily.   Yes  [provider]  Cholecalciferol (VITAMIN D3) 125 MCG (5000 UT) TABS Take 1 tablet (5,000 Units total) by mouth daily. 10/09/18  Yes McLean-Scocuzza, Nino Glow, MD  citalopram (CELEXA) 20 MG tablet Take 20 mg by mouth daily. 01/10/22  Yes [provider]  Cranberry 450 MG TABS Take 1 tablet by mouth every morning. 01/03/22  Yes [provider]  HYDROcodone-acetaminophen (NORCO/VICODIN) 5-325 MG tablet Take 1 tablet by mouth every 6 (six) hours as needed for moderate pain. 05/13/22  Yes Hollice Espy, MD  hydrocortisone cream 1 % Apply 1 Application topically 2 (two) times daily as needed for itching.   Yes [provider]  lactobacillus acidophilus (BACID) TABS tablet Take 1 tablet by mouth 2 (two) times daily. Take one capsule twice a day x 15 days. Started 07/05/22.   Yes [provider]  levothyroxine (SYNTHROID) 100 MCG tablet Take 1 tablet (100 mcg total) by mouth daily before breakfast. Patient taking differently: Take 100 mcg by mouth daily. At bedtime 03/06/22  Yes Wieting, Richard, MD  melatonin 5 MG TABS Take 1 tablet (5 mg total) by mouth at bedtime. 04/04/21  Yes Val Riles, MD  midodrine (PROAMATINE) 10 MG tablet Take 1 tablet (10 mg total) by mouth 3 (three) times daily with meals. 04/17/22  Yes Wouk, Ailene Rud, MD  montelukast (SINGULAIR) 10 MG tablet Take 1 tablet (10 mg total) by mouth at bedtime. 02/14/20  Yes McLean-Scocuzza, Nino Glow, MD  nystatin powder Apply 1 Application topically every 6 (six) hours as needed (groin rash).   Yes [provider]  omeprazole (PRILOSEC) 40 MG capsule Take 1 capsule (40 mg total) by mouth daily. After lunch 06/23/20  Yes McLean-Scocuzza, Nino Glow, MD  ondansetron (ZOFRAN) 4 MG tablet Take 1 tablet (4 mg total) by mouth every 8 (eight) hours as needed for nausea or vomiting. 05/16/22  Yes [name], MD  oxybutynin (DITROPAN) 5 MG tablet Take 1 tablet (5 mg total) by mouth every 8 (eight) hours as  needed for bladder spasms. 05/13/22  Yes Hollice Espy, MD  polyethylene glycol (MIRALAX / GLYCOLAX) 17 g packet Take 17 g by mouth daily.   Yes [provider]  potassium chloride SA (KLOR-CON M) 20  MEQ tablet Take 1 tablet (20 mEq total) by mouth 2 (two) times daily. 08/30/21  Yes Nicole Kindred A, DO  pregabalin (LYRICA) 150 MG capsule Take 150 mg by mouth at bedtime.   Yes [provider]  pregabalin (LYRICA) 75 MG capsule Take 75 mg by mouth every morning.   Yes [provider]  sacubitril-valsartan (ENTRESTO) 24-26 MG Take 1 tablet by mouth 2 (two) times daily. 06/19/22  Yes Nigel Ericsson, Otila Kluver A, FNP  simethicone (MYLICON) 80 MG chewable tablet Chew 80 mg by mouth every 6 (six) hours as needed for flatulence.   Yes [provider]  traZODone (DESYREL) 100 MG tablet Take 100 mg by mouth at bedtime. 12/13/21  Yes [provider]  Biotin 1 MG CAPS Take 1,000 mcg by mouth daily. Patient not taking: Reported on 06/19/2022    [provider]   'Review of Systems  Constitutional:  Positive for fatigue. Negative for appetite change.  HENT:  Negative for congestion, postnasal drip and sore throat.   Eyes: Negative.   Respiratory:  Positive for cough (dry) and shortness of breath (easily). Negative for chest tightness.   Cardiovascular:  Positive for leg swelling (left ankle). Negative for chest pain and palpitations.  Gastrointestinal:  Negative for abdominal distention and abdominal pain.  Endocrine: Negative.   Genitourinary: Negative.   Musculoskeletal:  Positive for arthralgias (right shoulder/ left knee) and back pain (chronic due to 4 rods in back). Negative for neck pain.  Skin: Negative.   Allergic/Immunologic: Negative.   Neurological:  Positive for weakness (in legs), light-headedness and headaches. Negative for dizziness.  Hematological:  Negative for adenopathy. Does not bruise/bleed easily.  Psychiatric/Behavioral:  Negative for  dysphoric mood and sleep disturbance (sleeping on 1 pillow). The patient is nervous/anxious.    Vitals:   07/18/22 1217  BP: 107/62  Pulse: 62  Resp: 18  SpO2: 100%  Weight: 190 lb (86.2 kg)   Wt Readings from Last 3 Encounters:  07/18/22 190 lb (86.2 kg)  06/21/22 190 lb (86.2 kg)  06/19/22 192 lb (87.1 kg)   Lab Results  Component Value Date   CREATININE 0.88 05/16/2022   CREATININE 1.01 (H) 05/15/2022   CREATININE 1.00 05/14/2022   Physical Exam Vitals and nursing note reviewed.  Constitutional:      Appearance: Kimberly Cook is well-developed.  HENT:     Head: Normocephalic and atraumatic.  Cardiovascular:     Rate and Rhythm: Normal rate and regular rhythm.  Pulmonary:     Effort: Pulmonary effort is normal. No respiratory distress.     Breath sounds: No wheezing or rales.  Abdominal:     General: There is no distension.     Palpations: Abdomen is soft.     Tenderness: There is no abdominal tenderness.  Musculoskeletal:     Cervical back: Normal range of motion and neck supple.     Right lower leg: No edema.     Left lower leg: No tenderness. Edema (trace pitting) present.  Skin:    General: Skin is warm and dry.  Neurological:     General: No focal deficit present.     Mental Status: Kimberly Cook is alert and oriented to person, place, and time.  Psychiatric:        Mood and Affect: Mood normal.        Behavior: Behavior normal.     Assessment & Plan:  1: Chronic heart failure with reduced ejection fraction- - NYHA class III - euvolemic today -  being weighed daily; reminded to call for an overnight weight gain of > 2 pounds or a weekly weight gain of >5 pounds - weight down 2 pounds from last visit here 1 month ago - currently not adding salt except to grits - on GDMT of entresto (consider titration at next visit depending on BP) - check BMP today as entresto was started at last visit - has recurrent cystitis so not a good candidate for SGLT2 - has been bradycardic so  may not be able to add beta-blocker - does wear oxygen at 2L at bedtime and PRN during the day - wearing compression socks daily  - order written for PT evaluation to be done at SNF due to her leg weakness - BNP 04/16/22 was 159.1  2: HTN- - BP looks good (107/62) & markedly lower than last visit after starting entresto - currently seeing PCP at facility - [facility] 05/16/22 reviewed and showed sodium 139, potassium 3.4, creatinine 0.88 and GFR >60  3: CAD-1 - saw cardiology ([place]) 06/21/22 - currently taking aspirin, atorvastatin  4: Recurrent cystitis- - saw urology during recent admission - renal ultrasound has been done in the past - has had bladder biopsy done   Facility medication list reviewed.   Return in 3 months, sooner if needed

## 2022-07-22 DIAGNOSIS — I739 Peripheral vascular disease, unspecified: Secondary | ICD-10-CM | POA: Diagnosis not present

## 2022-07-22 DIAGNOSIS — L603 Nail dystrophy: Secondary | ICD-10-CM | POA: Diagnosis not present

## 2022-07-23 DIAGNOSIS — G4700 Insomnia, unspecified: Secondary | ICD-10-CM | POA: Diagnosis not present

## 2022-07-23 DIAGNOSIS — M810 Age-related osteoporosis without current pathological fracture: Secondary | ICD-10-CM | POA: Diagnosis not present

## 2022-07-23 DIAGNOSIS — G629 Polyneuropathy, unspecified: Secondary | ICD-10-CM | POA: Diagnosis not present

## 2022-07-23 DIAGNOSIS — K5900 Constipation, unspecified: Secondary | ICD-10-CM | POA: Diagnosis not present

## 2022-07-23 DIAGNOSIS — J309 Allergic rhinitis, unspecified: Secondary | ICD-10-CM | POA: Diagnosis not present

## 2022-07-23 DIAGNOSIS — F32A Depression, unspecified: Secondary | ICD-10-CM | POA: Diagnosis not present

## 2022-07-23 DIAGNOSIS — K219 Gastro-esophageal reflux disease without esophagitis: Secondary | ICD-10-CM | POA: Diagnosis not present

## 2022-07-23 DIAGNOSIS — E559 Vitamin D deficiency, unspecified: Secondary | ICD-10-CM | POA: Diagnosis not present

## 2022-07-24 ENCOUNTER — Ambulatory Visit (INDEPENDENT_AMBULATORY_CARE_PROVIDER_SITE_OTHER): Payer: Medicare HMO | Admitting: Urology

## 2022-07-24 VITALS — BP 103/69 | HR 70 | Ht 64.0 in

## 2022-07-24 DIAGNOSIS — N3289 Other specified disorders of bladder: Secondary | ICD-10-CM

## 2022-07-24 DIAGNOSIS — N390 Urinary tract infection, site not specified: Secondary | ICD-10-CM | POA: Diagnosis not present

## 2022-07-24 NOTE — Progress Notes (Signed)
I, DeAsia L Maxie,acting as a scribe for Hollice Espy, MD.,have documented all relevant documentation on the behalf of Hollice Espy, MD,as directed by  Hollice Espy, MD while in the presence of Hollice Espy, MD.   07/24/22 10:07 AM   Kimberly Cook [birth year] [id number]  Chief Complaint  Patient presents with   Follow-up    On results    HPI: 76 year-old female with a personal history of recurrent ESBL E. Coli infections versus colonization, presents today for follow-up with renal bladder ultrasound.  Renal ultrasound on 06/17/22 showed no hydronephrosis. Asymmetric urinary bladder wall thickening and small amount of layering debris.   Most recently, she underwent bladder biopsy on 05/13/2022 of the bladder wall thickening on the right lateral bladder wall, which showed inflamed granulation tissue consistent with ulceration and acutely inflamed metaplastic, squamous epithelium.   Notably, she was treated prior to the procedure and ended up presenting to the emergency room the following day.  She was treated for presumptive UTI although urine culture was negative.  She also had nausea vomiting presumably related to anesthesia.  She fell in September which she developed a concussion, she's now in physical therapy but still residing at [facility].    She continues to live in assisted today she denies any UTI symptoms.  She does mention today that she was treated and isolated a few weeks ago for another urinary tract infection.  She reports that she did not have any significant urinary symptoms at the time.  She was told that they just needed to repeat a urinalysis 2 weeks later to ensure that it cleared and that is when they treated her with another round antibiotics.  PMH: Past Medical History:  Diagnosis Date   Acute respiratory failure, unsp w hypoxia or hypercapnia (HCC)    Allergy    Anxiety    a.) on BZO (alprazolam) PRN   Aortic atherosclerosis (HCC)    Arthritis     Back pain    Chronic HFrEF (heart failure with reduced ejection fraction) ([place])    a. 08/2021 echo: EF 30-35%, glob HK, GrI DD; b. 10/2021 Echo: EF 30-35%.   CKD (chronic kidney disease), stage II    Coronary artery disease    a. Mild to moderate CAD in LAD/diagonal by CTA (CT-FFR of apical LAD 0.79); b. 08/2021 Cath: LM nl, LAD min irregs, D1/2/3 nl, LCX nl, OM2/3 nl, RCA 20p, RPDA mild dzs, RPAV nl.   DDD (degenerative disc disease), lumbar    Depression    Diverticulosis    Essential hypertension    GERD (gastroesophageal reflux disease)    Headache    Hiatal hernia    History of shingles    HLD (hyperlipidemia)    Hypothyroidism    Lung nodule    Mini stroke 2011   Moderate Pericardial effusion    a. 08/2021 Echo: moderate circumferential pericardial effusion w/o tamponade; b. 10/2021 Echo: EF 30-35%, small to mod circumferential pericardial effusion w/o tamponade.   Muscle weakness    NICM (nonischemic cardiomyopathy) ([place])    a. 08/2021 Echo: EF 30-35%, glob HK, GrI DD, nl RV fxn, mild-mod dil LA, mod circumferential pericardial eff w/o tamponade, Mod MR; b. 10/2021 Echo: EF 30-35%, glob HK.   Occipital neuralgia    On supplemental oxygen by nasal cannula    a.) 2L.[place] at bedtime and PRN   Palpitations    Pancytopenia (HCC)    Pleural effusion on left    a. 08/2021 s/p  thoracentesis.   Pneumonia 2018   Polyneuropathy    Prediabetes    Renal cyst, left    a.) Renal artery [place] 12/26/2017: measured 2.1 cm.   Sleep difficulties    a.) takes trazodone + melatonin   Stroke ([place])    MRI 04/2008 + left sup. frontal gyrus possibly puntate infarct    Syncope 2019   Vitamin B deficiency    Vitamin D deficiency     Surgical History: Past Surgical History:  Procedure Laterality Date   ABDOMINAL HYSTERECTOMY     BLADDER SURGERY     2003   BREAST EXCISIONAL BIOPSY Right Over 20 years    Benign   CHOLECYSTECTOMY     CYSTOSCOPY W/ URETERAL STENT PLACEMENT Left 05/13/2022    Procedure: CYSTOSCOPY WITH RETROGRADE PYELOGRAM/URETERAL LEFT STENT PLACEMENT;  Surgeon: Hollice Espy, MD;  Location: [facility];  Service: Urology;  Laterality: Left;   CYSTOSCOPY WITH BIOPSY N/A 05/13/2022   Procedure: CYSTOSCOPY WITH BLADDER BIOPSY;  Surgeon: Hollice Espy, MD;  Location: [facility];  Service: Urology;  Laterality: N/A;   gastroplication      KNEE ARTHROSCOPY Left 2011   PULSE GENERATOR IMPLANT Left 01/31/2020   Procedure: PLACEMENT RIGHT FLANK PULSE GENERATOR VS REMOVAL SPINAL CORD STIMULATOR;  Surgeon: Deetta Perla, MD;  Location: [facility];  Service: Neurosurgery;  Laterality: Left;   PULSE GENERATOR IMPLANT Left 04/24/2020   Procedure: REPLACEMENT LEFT FLANK PULSE GENERATOR IMPLANT;  Surgeon: Deetta Perla, MD;  Location: [facility];  Service: Neurosurgery;  Laterality: Left;  MAC w/ local   REPLACEMENT TOTAL KNEE Left    right arm fracture     RIGHT/LEFT HEART CATH AND CORONARY ANGIOGRAPHY N/A 08/28/2021   Procedure: RIGHT/LEFT HEART CATH AND CORONARY ANGIOGRAPHY;  Surgeon: Wellington Hampshire, MD;  Location: [facility];  Service: Cardiovascular;  Laterality: N/A;   SPINAL CORD STIMULATOR REMOVAL N/A 06/26/2020   Procedure: SPINAL CORD STIMULATOR REMOVAL;  Surgeon: Deetta Perla, MD;  Location: [facility];  Service: Neurosurgery;  Laterality: N/A;   THORACIC LAMINECTOMY FOR SPINAL CORD STIMULATOR N/A 01/24/2020   Procedure: THORACIC SPINAL CORD STIMULATOR PADDLE TRIAL VIA LAMINECTOMY;  Surgeon: Deetta Perla, MD;  Location: [facility];  Service: Neurosurgery;  Laterality: N/A;   TONSILLECTOMY AND ADENOIDECTOMY     TOTAL KNEE ARTHROPLASTY Left 04/17/2015   Procedure: LEFT TOTAL KNEE ARTHROPLASTY;  Surgeon: Paralee Cancel, MD;  Location: [facility];  Service: Orthopedics;  Laterality: Left;    Home Medications:  Allergies as of 07/24/2022   No Known Allergies      Medication List        Accurate as of July 24, 2022 10:07 AM. If you have any questions, ask your nurse  or doctor.          STOP taking these medications    Biotin 1 MG Caps   lactobacillus acidophilus Tabs tablet   potassium chloride SA 20 MEQ tablet Commonly known as: KLOR-CON M       TAKE these medications    acetaminophen 500 MG tablet Commonly known as: TYLENOL Take 1,000 mg by mouth every 8 (eight) hours as needed for moderate pain.   alendronate 70 MG tablet Commonly known as: FOSAMAX Take 70 mg by mouth once a week.   ALPRAZolam 0.5 MG tablet Commonly known as: XANAX Take 1 tablet (0.5 mg total) by mouth 2 (two) times daily as needed for anxiety.   aspirin EC 81 MG tablet Take 1 tablet (81 mg total) by mouth at  bedtime. Swallow whole.   atorvastatin 20 MG tablet Commonly known as: LIPITOR Take 1 tablet (20 mg total) by mouth daily.   calcium-vitamin D 500-5 MG-MCG tablet Commonly known as: OSCAL WITH D Take 1 tablet by mouth 2 (two) times daily.   carboxymethylcellulose 1 % ophthalmic solution Apply 1 drop to eye at bedtime.   cetirizine 5 MG tablet Commonly known as: ZYRTEC Take 5 mg by mouth daily.   citalopram 20 MG tablet Commonly known as: CELEXA Take 20 mg by mouth daily.   Cranberry 450 MG Tabs Take 1 tablet by mouth every morning.   HYDROcodone-acetaminophen 5-325 MG tablet Commonly known as: NORCO/VICODIN Take 1 tablet by mouth every 6 (six) hours as needed for moderate pain.   hydrocortisone cream 1 % Apply 1 Application topically 2 (two) times daily as needed for itching.   levothyroxine 100 MCG tablet Commonly known as: SYNTHROID Take 1 tablet (100 mcg total) by mouth daily before breakfast. What changed:  when to take this additional instructions   melatonin 5 MG Tabs Take 1 tablet (5 mg total) by mouth at bedtime.   midodrine 10 MG tablet Commonly known as: PROAMATINE Take 1 tablet (10 mg total) by mouth 3 (three) times daily with meals.   montelukast 10 MG tablet Commonly known as: SINGULAIR Take 1 tablet (10 mg  total) by mouth at bedtime.   nystatin powder Generic drug: nystatin Apply 1 Application topically every 6 (six) hours as needed (groin rash).   omeprazole 40 MG capsule Commonly known as: PRILOSEC Take 1 capsule (40 mg total) by mouth daily. After lunch   ondansetron 4 MG tablet Commonly known as: Zofran Take 1 tablet (4 mg total) by mouth every 8 (eight) hours as needed for nausea or vomiting.   oxybutynin 5 MG tablet Commonly known as: DITROPAN Take 1 tablet (5 mg total) by mouth every 8 (eight) hours as needed for bladder spasms.   polyethylene glycol 17 g packet Commonly known as: MIRALAX / GLYCOLAX Take 17 g by mouth daily.   pregabalin 75 MG capsule Commonly known as: LYRICA Take 75 mg by mouth every morning.   pregabalin 150 MG capsule Commonly known as: LYRICA Take 150 mg by mouth at bedtime.   sacubitril-valsartan 24-26 MG Commonly known as: ENTRESTO Take 1 tablet by mouth 2 (two) times daily.   simethicone 80 MG chewable tablet Commonly known as: MYLICON Chew 80 mg by mouth every 6 (six) hours as needed for flatulence.   STOOL SOFTENER PO Take by mouth. 2 capsules 2 times daily   traZODone 100 MG tablet Commonly known as: DESYREL Take 100 mg by mouth at bedtime.   Vitamin D3 125 MCG (5000 UT) Tabs Take 1 tablet (5,000 Units total) by mouth daily.        Family History: Family History  Problem Relation Age of Onset   Heart disease Mother    Hypertension Mother    Diabetes Mother    Heart attack Mother 15   Heart disease Father    Heart attack Father 3   Breast cancer Maternal Aunt    Heart attack Brother     Social History:  reports that she has never smoked. She has been exposed to tobacco smoke. She has never used smokeless tobacco. She reports that she does not drink alcohol and does not use drugs.   Physical Exam: BP 103/69   Pulse 70   Ht _0  (1.626 m)   BMI 32.61 kg/m  Constitutional:  Alert and oriented, No acute  distress. HEENT: [place] AT, moist mucus membranes.  Trachea midline, no masses. Neurologic: Grossly intact, no focal deficits, moving all 4 extremities. Psychiatric: Normal mood and affect.  Pertinent Imaging:  Results for orders placed during the hospital encounter of 06/14/22  US RENAL  Narrative CLINICAL DATA:  Bladder wall thickening status post ureteral stent removal  EXAM: RENAL / URINARY TRACT ULTRASOUND COMPLETE  COMPARISON:  CT abdomen pelvis 05/14/2022  FINDINGS: Right Kidney:  Renal measurements: 9.0 x 4.0 x 4.5 cm = volume: 86.2 mL. Echogenicity within normal limits. No mass or hydronephrosis visualized.  Left Kidney:  Renal measurements: 9.3 x 4.5 x 4.2 cm = volume: 91.2 mL. Normal renal cortical thickness and echogenicity. No hydronephrosis. There is a 1.6 x 1.5 x 1.4 cm cyst within the midpole of the left kidney.  Bladder:  Small amount of debris within the urinary bladder. Additionally, there is asymmetric urinary bladder wall thickening.  Other:  None.  IMPRESSION: 1. No hydronephrosis. 2. Asymmetric urinary bladder wall thickening and small amount of layering debris. Recommend clinical and laboratory correlation.   Electronically Signed By: Lovey Newcomer M.D. On: 06/17/2022 06:09  Personally reviewed and agrees with radiologic interpretation   Assessment & Plan:    Recurrent ESBL E. Coli - Likely chronically colonized. She continues to be treated to see if she's "cleared" for infection when she is asymptomatic.  -Would recommend against treatment in the absence of bladder pain, dysuria, or systemic signs of infections. -Would recommend that she not be treated unless cleared by infectious disease or urology as it appears that she has multiple providers caring for her who may not understand her clinical situation fully from a urologic perspective -She seems understand this and explicit instructions were sent to [facility] today  Bladder  wall thickening - Secondary to chronic inflammation, no evidence of malignancy.   Return in about 1 year (around 07/25/2023).  Or sooner as needed   [place] [address] [place], [place] [address] [phone]

## 2022-07-25 DIAGNOSIS — G629 Polyneuropathy, unspecified: Secondary | ICD-10-CM | POA: Diagnosis not present

## 2022-07-30 DIAGNOSIS — R2689 Other abnormalities of gait and mobility: Secondary | ICD-10-CM | POA: Diagnosis not present

## 2022-07-30 DIAGNOSIS — G44229 Chronic tension-type headache, not intractable: Secondary | ICD-10-CM | POA: Diagnosis not present

## 2022-07-30 DIAGNOSIS — Z8673 Personal history of transient ischemic attack (TIA), and cerebral infarction without residual deficits: Secondary | ICD-10-CM | POA: Diagnosis not present

## 2022-07-30 DIAGNOSIS — H538 Other visual disturbances: Secondary | ICD-10-CM | POA: Diagnosis not present

## 2022-08-06 DIAGNOSIS — L299 Pruritus, unspecified: Secondary | ICD-10-CM | POA: Diagnosis not present

## 2022-08-06 DIAGNOSIS — G629 Polyneuropathy, unspecified: Secondary | ICD-10-CM | POA: Diagnosis not present

## 2022-08-06 DIAGNOSIS — G8929 Other chronic pain: Secondary | ICD-10-CM | POA: Diagnosis not present

## 2022-08-19 NOTE — Progress Notes (Signed)
Follow-up Outpatient Visit Date: 08/21/2022  Primary Care Provider: System, Provider Not In No address on file  Chief Complaint: Follow-up heart failure  HPI:  Kimberly Cook is a 77 y.o. female with history of moderate coronary artery disease by cardiac CTA, chronic HFrEF, hypertension, hyperlipidemia, "mini strokes," GERD, depression, and anxiety , who presents for follow-up of HFrEF and CAD.  She was last seen in our office in mid November, by Gerrie Nordmann, NP, at which time she was doing fairly well, though she continued to complain of chronic dyspnea and leg swelling.  She had been without her diuretic for some time prior to her last visit.  She had been started on Entresto in lieu of torsemide shortly before her last visit.  She saw Darylene Price, NP, in follow-up in mid [place], at which time Kimberly Cook was felt to be euvolemic with NYHA class III symptoms.  No medication changes were made; she was felt to be a poor candidate for SGLT-2 inhibitors due to history of recurrent cystitis.  Bradycardia has also precluded beta-blocker use.  Today, Kimberly Cook reports that she is actually feeling fairly well.  She still resides at [facility], and has generalized weakness prevents her from returning home.  She mostly gets around with a wheelchair though she has been using a walker with the assistance of the physical therapist.  She has not had any falls.  She denies chest pain, shortness of breath, palpitations, and leg headedness.  Her leg swelling has been well-controlled off torsemide.  She frequently uses compression stockings to help with this.  She remains on midodrine and Entresto.  --------------------------------------------------------------------------------------------------  Cardiovascular History & Procedures: Cardiovascular Problems: Orthostatic lightheadedness and labile blood pressure Dyspnea on exertion   Risk Factors: Hypertension, hyperlipidemia, family history,  sedentary lifestyle, and age greater than 82   Cath/PCI: R/LHC (08/28/2021): LMCA, LAD, and LCx normal.  RCA with 20% proximal stenosis.  Mildly elevated right heart, left heart, and pulmonary artery pressures.  Mildly reduced cardiac output.   CV Surgery: None   EP Procedures and Devices: 14-day event monitor (04/24/17): Sinus rhythm without arrhythmias. 48 hour Holter monitor (03/20/17): Predominantly sinus rhythm with occasional PACs and rare PVCs. Brief episode of blocked PACs versus 2:1 AV block.   Non-Invasive Evaluation(s): Limited TTE (03/01/2022): Normal LV size and wall thickness.  LVE 45-50% with GLS -16.8%.  Diastolic parameter indeterminate.  Normal RV size and function.  No pericardial effusion. Limited TTE 10/05/2021): Mildly dilated LV with LVEF 30-35%.  Normal RV size and function.  Small to moderate-sized pericardial effusion.  No significant mitral regurgitation.  Aortic sclerosis present. TTE (08/26/2021): Mildly dilated LV with LVEF 30-35% and grade 1 diastolic dysfunction.  Normal RV size and function.  Moderate pericardial effusion without tamponade physiology.  Moderate pleural effusion.  Moderate mitral regurgitation.  Aortic sclerosis without stenosis. Pharmacologic MPI (09/13/2020): Low risk study without ischemia or scar.  Calculated LVEF of 36% artifactually low with normal LVEF by visual estimation. Renal artery Doppler (12/26/17): No evidence of renal artery stenosis.  Incidental note made of 2.1 cm left renal cyst. Carotid Doppler (09/08/17): Minimal plaquing of both carotid arteries.  No significant stenosis.  Patent vertebral arteries with antegrade flow bilaterally.  Normal hemodynamics in both subclavian arteries. Coronary CTA (04/30/17): Mild to moderate coronary artery disease proximal LAD and ostium of small D2.  CT FFR of distal most LAD is 0.79.  Coronary calcium score 257.  Small left lower lobe nodule (3 mm). TTE (  03/20/17): Normal LV size with mild focal basal  hypertrophy of the septum. LVEF 50-55% with normal wall motion. Grade 1 diastolic dysfunction. Mitral annular calcification present. Normal RV size and function. Normal pulmonary artery pressure. Pharmacologic MPI (12/14/15): Low risk without ischemia or scar. LVEF 68%. TTE (12/14/15): Normal LV size and wall thickness. LVEF 55-60%. Normal RV size and function. No significant valvular abnormalities.  Recent CV Pertinent Labs: Lab Results  Component Value Date   CHOL 125 11/24/2019   CHOL 119 08/22/2017   HDL 46.40 11/24/2019   HDL 57 08/22/2017   LDLCALC 47 11/24/2019   LDLCALC 46 08/22/2017   TRIG 158.0 (H) 11/24/2019   CHOLHDL 3 11/24/2019   INR 1.0 03/31/2021   BNP 159.1 (H) 04/16/2022   K 3.4 (L) 05/16/2022   MG 2.2 04/12/2022   BUN 18 05/16/2022   BUN 20 09/11/2021   CREATININE 0.88 05/16/2022   CREATININE 1.18 (H) 01/07/2017    Past medical and surgical history were reviewed and updated in EPIC.  Current Meds  Medication Sig   acetaminophen (TYLENOL) 500 MG tablet Take 650 mg by mouth every 8 (eight) hours as needed for moderate pain.   alendronate (FOSAMAX) 70 MG tablet Take 70 mg by mouth once a week.    ALPRAZolam (XANAX) 0.5 MG tablet Take 1 tablet (0.5 mg total) by mouth 2 (two) times daily as needed for anxiety.   aspirin EC 81 MG tablet Take 1 tablet (81 mg total) by mouth at bedtime. Swallow whole.   atorvastatin (LIPITOR) 20 MG tablet Take 1 tablet (20 mg total) by mouth daily.   carboxymethylcellulose 1 % ophthalmic solution Apply 1 drop to eye at bedtime.   cetirizine (ZYRTEC) 5 MG tablet Take 5 mg by mouth daily.   Cholecalciferol (VITAMIN D3) 125 MCG (5000 UT) TABS Take 1 tablet (5,000 Units total) by mouth daily.   Cranberry 450 MG TABS Take 1 tablet by mouth every morning.   Docusate Calcium (STOOL SOFTENER PO) Take by mouth. 2 capsules 2 times daily   HYDROcodone-acetaminophen (NORCO/VICODIN) 5-325 MG tablet Take 1 tablet by mouth every 6 (six) hours as  needed for moderate pain.   hydrocortisone cream 1 % Apply 1 Application topically 2 (two) times daily as needed for itching.   levothyroxine (SYNTHROID) 100 MCG tablet Take 1 tablet (100 mcg total) by mouth daily before breakfast. (Patient taking differently: Take 100 mcg by mouth daily. At bedtime)   losartan (COZAAR) 25 MG tablet Take 0.5 tablets (12.5 mg total) by mouth daily.   melatonin 5 MG TABS Take 1 tablet (5 mg total) by mouth at bedtime.   midodrine (PROAMATINE) 10 MG tablet Take 1 tablet (10 mg total) by mouth 3 (three) times daily with meals.   montelukast (SINGULAIR) 10 MG tablet Take 1 tablet (10 mg total) by mouth at bedtime.   omeprazole (PRILOSEC) 40 MG capsule Take 1 capsule (40 mg total) by mouth daily. After lunch   ondansetron (ZOFRAN) 4 MG tablet Take 1 tablet (4 mg total) by mouth every 8 (eight) hours as needed for nausea or vomiting.   oxybutynin (DITROPAN) 5 MG tablet Take 1 tablet (5 mg total) by mouth every 8 (eight) hours as needed for bladder spasms.   polyethylene glycol (MIRALAX / GLYCOLAX) 17 g packet Take 17 g by mouth daily.   POTASSIUM CHLORIDE ER PO Take 20 mEq by mouth in the morning and at bedtime.   pregabalin (LYRICA) 150 MG capsule Take 150 mg by  mouth at bedtime.   pregabalin (LYRICA) 75 MG capsule Take 75 mg by mouth every morning.   simethicone (MYLICON) 80 MG chewable tablet Chew 80 mg by mouth every 6 (six) hours as needed for flatulence.   traZODone (DESYREL) 100 MG tablet Take 100 mg by mouth at bedtime.   venlafaxine (EFFEXOR) 75 MG tablet Take 75 mg by mouth 2 (two) times daily.   [DISCONTINUED] sacubitril-valsartan (ENTRESTO) 24-26 MG Take 1 tablet by mouth 2 (two) times daily.    Allergies: Patient has no known allergies.  Social History   Tobacco Use   Smoking status: Never    Passive exposure: Yes   Smokeless tobacco: Never   Tobacco comments:    husbands and children smoked in home.   Vaping Use   Vaping Use: Never used   Substance Use Topics   Alcohol use: No   Drug use: No    Family History  Problem Relation Age of Onset   Heart disease Mother    Hypertension Mother    Diabetes Mother    Heart attack Mother 84   Heart disease Father    Heart attack Father 41   Breast cancer Maternal Aunt    Heart attack Brother     Review of Systems: A 12-system review of systems was performed and was negative except as noted in the HPI.  --------------------------------------------------------------------------------------------------  Physical Exam: BP 102/70 (BP Location: Left Arm, Patient Position: Sitting, Cuff Size: Large)   Pulse 72   Ht 5\' 4"  (1.626 m)   Wt 191 lb 6.4 oz (86.8 kg)   SpO2 95%   BMI 32.85 kg/m   General:  NAD.  Seated in a wheelchair. Neck: No JVD or HJR. Lungs: Clear to auscultation bilaterally without wheezes or crackles. Heart: Regular rate and rhythm without murmurs, rubs, or gallops. Abdomen: Soft, nontender, nondistended. Extremities: No lower extremity edema.  EKG: Normal sinus rhythm with borderline LVH and poor R wave progression.  No significant change from prior tracing on 06/21/2022.  Lab Results  Component Value Date   WBC 6.1 05/16/2022   HGB 12.3 05/16/2022   HCT 36.3 05/16/2022   MCV 92.8 05/16/2022   PLT 143 (L) 05/16/2022    Lab Results  Component Value Date   NA 139 05/16/2022   K 3.4 (L) 05/16/2022   CL 107 05/16/2022   CO2 24 05/16/2022   BUN 18 05/16/2022   CREATININE 0.88 05/16/2022   GLUCOSE 98 05/16/2022   ALT 18 05/14/2022    Lab Results  Component Value Date   CHOL 125 11/24/2019   HDL 46.40 11/24/2019   LDLCALC 47 11/24/2019   TRIG 158.0 (H) 11/24/2019   CHOLHDL 3 11/24/2019    --------------------------------------------------------------------------------------------------  ASSESSMENT AND PLAN: Chronic HFrEF: Kimberly Cook appears euvolemic on exam despite being off diuretics.  It is difficult to assess her functional  status due to her generalized weakness and limited mobility, though she denies any heart failure symptoms.  I remain concerned about her soft blood pressures with concurrent midodrine and Entresto therapy.  We have agreed to switch from [facility] to losartan 12.5 mg twice daily and have her return in 3 to 4 weeks to reassess her blood pressure.  If blood pressure allows, I would favor weaning her off midodrine and carefully escalating losartan as tolerated.  We could try rechallenging her with a beta-blocker if her blood pressure allows in the future, though beta-blocker therapy has also been limited in the past by her bradycardia.  Defer standing diuretic at this time.  We will plan to recheck a BMP when she returns for her follow-up visit in 3 to 4 weeks.  Nonobstructive coronary artery disease and mixed hyperlipidemia: Kimberly Cook has not had any angina.  We will continue aspirin and atorvastatin to prevent progression of disease.  As it has been a few years since her last lipid panel in our system, we will plan to repeat a lipid panel when she returns for follow-up in a few weeks and has her BMP drawn.  Follow-up: Return to clinic in 3-4 with BMP and lipid panel at that time.  Nelva Bush, MD 08/21/2022 12:03 PM

## 2022-08-21 ENCOUNTER — Ambulatory Visit: Payer: Medicare HMO | Attending: Internal Medicine | Admitting: Internal Medicine

## 2022-08-21 ENCOUNTER — Encounter: Payer: Self-pay | Admitting: Internal Medicine

## 2022-08-21 VITALS — BP 102/70 | HR 72 | Ht 64.0 in | Wt 191.4 lb

## 2022-08-21 DIAGNOSIS — I2510 Atherosclerotic heart disease of native coronary artery without angina pectoris: Secondary | ICD-10-CM

## 2022-08-21 DIAGNOSIS — E782 Mixed hyperlipidemia: Secondary | ICD-10-CM

## 2022-08-21 DIAGNOSIS — Z79899 Other long term (current) drug therapy: Secondary | ICD-10-CM | POA: Diagnosis not present

## 2022-08-21 DIAGNOSIS — I5022 Chronic systolic (congestive) heart failure: Secondary | ICD-10-CM | POA: Diagnosis not present

## 2022-08-21 MED ORDER — LOSARTAN POTASSIUM 25 MG PO TABS: 12.5000 mg | ORAL_TABLET | Freq: Every day | ORAL | 3 refills | Status: DC

## 2022-08-21 NOTE — Addendum Note (Signed)
Addended by: Meryl Crutch on: 08/21/2022 03:04 PM   Modules accepted: Orders

## 2022-08-21 NOTE — Patient Instructions (Signed)
Medication Instructions:  Your physician recommends the following medication changes.  STOP TAKING: Entresto 24-26 mg twice a day  START TAKING: Losartan 12.5 mg daily  *If you need a refill on your cardiac medications before your next appointment, please call your pharmacy*   Lab Work: Your provider would like for you to return in 3-4 weeks to have the following labs drawn: (BMP).   Please go to the [facility] entrance and check in at the front desk.  You do not need an appointment.  They are open from 7am-6 pm.  You will not need to be fasting.    Testing/Procedures: None ordered today   Follow-Up: At [facility], you and your health needs are our priority.  As part of our continuing mission to provide you with exceptional heart care, we have created designated Provider Care Teams.  These Care Teams include your primary Cardiologist (physician) and Advanced Practice Providers (APPs -  Physician Assistants and Nurse Practitioners) who all work together to provide you with the care you need, when you need it.  We recommend signing up for the patient portal called "MyChart".  Sign up information is provided on this After Visit Summary.  MyChart is used to connect with patients for Virtual Visits (Telemedicine).  Patients are able to view lab/test results, encounter notes, upcoming appointments, etc.  Non-urgent messages can be sent to your provider as well.   To learn more about what you can do with MyChart, go to [email or web address].    Your next appointment:   3-4 week(s)  Provider:   Nelva Bush, MD

## 2022-08-27 DIAGNOSIS — G8929 Other chronic pain: Secondary | ICD-10-CM | POA: Diagnosis not present

## 2022-08-28 DIAGNOSIS — F4323 Adjustment disorder with mixed anxiety and depressed mood: Secondary | ICD-10-CM | POA: Diagnosis not present

## 2022-08-28 DIAGNOSIS — F5101 Primary insomnia: Secondary | ICD-10-CM | POA: Diagnosis not present

## 2022-08-29 DIAGNOSIS — Z23 Encounter for immunization: Secondary | ICD-10-CM | POA: Diagnosis not present

## 2022-09-03 DIAGNOSIS — G629 Polyneuropathy, unspecified: Secondary | ICD-10-CM | POA: Diagnosis not present

## 2022-09-03 DIAGNOSIS — R609 Edema, unspecified: Secondary | ICD-10-CM | POA: Diagnosis not present

## 2022-09-03 DIAGNOSIS — Z760 Encounter for issue of repeat prescription: Secondary | ICD-10-CM | POA: Diagnosis not present

## 2022-09-05 DIAGNOSIS — E119 Type 2 diabetes mellitus without complications: Secondary | ICD-10-CM | POA: Diagnosis not present

## 2022-09-06 ENCOUNTER — Telehealth: Payer: Self-pay | Admitting: Family

## 2022-09-06 NOTE — Telephone Encounter (Signed)
Crystal, RN from [facility] called to report a weight gain.Yesterday patient weighed 191.6 and today she weighed 196.8 pounds. Denies any edema or SOB. Crystal says that patient's BP have been normal or even "a little high" as the midodrine is getting weaned off. Currently not taking any standing diuretic.   Will give torsemide 20mg  today only. She will let us know if weight gain continues or other symptoms occur with it. Crystal verbalized understanding.

## 2022-09-09 DIAGNOSIS — N399 Disorder of urinary system, unspecified: Secondary | ICD-10-CM | POA: Diagnosis not present

## 2022-09-16 DIAGNOSIS — F5101 Primary insomnia: Secondary | ICD-10-CM | POA: Diagnosis not present

## 2022-09-16 DIAGNOSIS — F4323 Adjustment disorder with mixed anxiety and depressed mood: Secondary | ICD-10-CM | POA: Diagnosis not present

## 2022-09-19 ENCOUNTER — Other Ambulatory Visit
Admission: RE | Admit: 2022-09-19 | Discharge: 2022-09-19 | Disposition: A | Discharge status: Home / Self Care | Payer: Medicare HMO | Attending: Internal Medicine | Admitting: Internal Medicine

## 2022-09-19 ENCOUNTER — Ambulatory Visit: Payer: Medicare HMO | Attending: Internal Medicine | Admitting: Internal Medicine

## 2022-09-19 ENCOUNTER — Encounter: Payer: Self-pay | Admitting: Internal Medicine

## 2022-09-19 VITALS — BP 110/70 | HR 93 | Ht 64.0 in | Wt 195.0 lb

## 2022-09-19 DIAGNOSIS — Z79899 Other long term (current) drug therapy: Secondary | ICD-10-CM | POA: Insufficient documentation

## 2022-09-19 DIAGNOSIS — G44309 Post-traumatic headache, unspecified, not intractable: Secondary | ICD-10-CM | POA: Diagnosis not present

## 2022-09-19 DIAGNOSIS — R531 Weakness: Secondary | ICD-10-CM

## 2022-09-19 DIAGNOSIS — E782 Mixed hyperlipidemia: Secondary | ICD-10-CM

## 2022-09-19 DIAGNOSIS — I5023 Acute on chronic systolic (congestive) heart failure: Secondary | ICD-10-CM | POA: Diagnosis not present

## 2022-09-19 DIAGNOSIS — I2510 Atherosclerotic heart disease of native coronary artery without angina pectoris: Secondary | ICD-10-CM | POA: Diagnosis not present

## 2022-09-19 LAB — BASIC METABOLIC PANEL
Anion gap: 5 (ref 5–15)
BUN: 14 mg/dL (ref 8–23)
CO2: 25 mmol/L (ref 22–32)
Calcium: 8.8 mg/dL — ABNORMAL LOW (ref 8.9–10.3)
Chloride: 106 mmol/L (ref 98–111)
Creatinine, Ser: 0.86 mg/dL (ref 0.44–1.00)
GFR, Estimated: >60 mL/min (ref >60)
Glucose, Bld: 136 mg/dL — ABNORMAL HIGH (ref 70–99)
Potassium: 4.2 mmol/L (ref 3.5–5.1)
Sodium: 136 mmol/L (ref 135–145)

## 2022-09-19 LAB — LIPID PANEL
Cholesterol: 131 mg/dL (ref 0–200)
HDL: 46 mg/dL (ref >40)
LDL Cholesterol: 59 mg/dL (ref 0–99)
Total CHOL/HDL Ratio: 2.8 RATIO
Triglycerides: 132 mg/dL (ref <150)
VLDL: 26 mg/dL (ref 0–40)

## 2022-09-19 MED ORDER — TORSEMIDE 20 MG PO TABS: 20.0000 mg | ORAL_TABLET | Freq: Every day | ORAL | 3 refills | Status: DC

## 2022-09-19 MED ORDER — MIDODRINE HCL 5 MG PO TABS: 5.0000 mg | ORAL_TABLET | Freq: Three times a day (TID) | ORAL | 3 refills | Status: DC

## 2022-09-19 NOTE — Patient Instructions (Signed)
Medication Instructions:  Your physician recommends the following medication changes.  START TAKING: Torsemide 20 mg by mouth daily   DECREASE: Midodrine to 5 mg by mouth three times a day with meals   *If you need a refill on your cardiac medications before your next appointment, please call your pharmacy*   Lab Work: None ordered today   Testing/Procedures: None ordered today   Follow-Up: At [facility], you and your health needs are our priority.  As part of our continuing mission to provide you with exceptional heart care, we have created designated Provider Care Teams.  These Care Teams include your primary Cardiologist (physician) and Advanced Practice Providers (APPs -  Physician Assistants and Nurse Practitioners) who all work together to provide you with the care you need, when you need it.  We recommend signing up for the patient portal called "MyChart".  Sign up information is provided on this After Visit Summary.  MyChart is used to connect with patients for Virtual Visits (Telemedicine).  Patients are able to view lab/test results, encounter notes, upcoming appointments, etc.  Non-urgent messages can be sent to your provider as well.   To learn more about what you can do with MyChart, go to [email or web address].    Your next appointment:   2 week(s)  Provider:   You may see Nelva Bush, MD or one of the following Advanced Practice Providers on your designated Care Team:   Murray Hodgkins, NP Christell Faith, PA-C Cadence Kathlen Mody, PA-C Gerrie Nordmann, NP

## 2022-09-19 NOTE — Progress Notes (Signed)
Follow-up Outpatient Visit Date: 09/19/2022  Primary Care Provider: System, Provider Not In No address on file  Chief Complaint: Follow-up HFrEF  HPI:  Kimberly Cook is a 77 y.o. female with history of moderate coronary artery disease by cardiac CTA, chronic HFrEF, hypertension, hyperlipidemia, "mini strokes," GERD, depression, and anxiety, who presents for follow-up of coronary artery disease and HFrEF.  I last saw her a month ago, at which time she was feeling fairly well but still residing at [facility] due to generalized weakness that has prevented her from returning home.  She was noted to be on a combination of midodrine and Entresto initiated by Kimberly Cook in the heart failure clinic.  We agreed to switch Entresto to low-dose losartan and have her return in a few weeks to recheck blood pressure.  Today, Kimberly Cook eports that she feels about the same as at our last visit.  She still is a little bit lightheaded at times but has not had any falls.  Her breathing seems to be okay, though she is still not very active.  She is trying to do some physical therapy.  She has not had any chest pain or palpitations.  Kimberly Cook reports worsening leg edema and some discomfort due to tightness around her ankles.  Due to progressive weight gain over the last few weeks, the heart failure clinic was contacted and recommended a one-time dose of torsemide 20 mg daily.  Kimberly Cook is also concerned about persistent posterior headaches as well as generalized weakness since her fall and concussion in 04/2022.  She has not been evaluated by her neurologist.  --------------------------------------------------------------------------------------------------  Cardiovascular History & Procedures: Cardiovascular Problems: Orthostatic lightheadedness and labile blood pressure Dyspnea on exertion HFrEF   Risk Factors: Hypertension, hyperlipidemia, family history, sedentary lifestyle, and age greater  than 6   Cath/PCI: R/LHC (08/28/2021): LMCA, LAD, and LCx normal.  RCA with 20% proximal stenosis.  Mildly elevated right heart, left heart, and pulmonary artery pressures.  Mildly reduced cardiac output.   CV Surgery: None   EP Procedures and Devices: 14-day event monitor (04/24/17): Sinus rhythm without arrhythmias. 48 hour Holter monitor (03/20/17): Predominantly sinus rhythm with occasional PACs and rare PVCs. Brief episode of blocked PACs versus 2:1 AV block.   Non-Invasive Evaluation(s): Carotid artery Doppler (04/12/2022): No significant stenosis of either internal carotid artery.  Antegrade flow in both vertebral arteries. Limited TTE (03/01/2022): Normal LV size and wall thickness.  LVE 45-50% with GLS -16.8%.  Diastolic parameter indeterminate.  Normal RV size and function.  No pericardial effusion. Limited TTE 10/05/2021): Mildly dilated LV with LVEF 30-35%.  Normal RV size and function.  Small to moderate-sized pericardial effusion.  No significant mitral regurgitation.  Aortic sclerosis present. TTE (08/26/2021): Mildly dilated LV with LVEF 30-35% and grade 1 diastolic dysfunction.  Normal RV size and function.  Moderate pericardial effusion without tamponade physiology.  Moderate pleural effusion.  Moderate mitral regurgitation.  Aortic sclerosis without stenosis. Pharmacologic MPI (09/13/2020): Low risk study without ischemia or scar.  Calculated LVEF of 36% artifactually low with normal LVEF by visual estimation. Renal artery Doppler (12/26/17): No evidence of renal artery stenosis.  Incidental note made of 2.1 cm left renal cyst. Carotid Doppler (09/08/17): Minimal plaquing of both carotid arteries.  No significant stenosis.  Patent vertebral arteries with antegrade flow bilaterally.  Normal hemodynamics in both subclavian arteries. Coronary CTA (04/30/17): Mild to moderate coronary artery disease proximal LAD and ostium of small D2.  CT FFR of  distal most LAD is 0.79.  Coronary calcium score  257.  Small left lower lobe nodule (3 mm). TTE (03/20/17): Normal LV size with mild focal basal hypertrophy of the septum. LVEF 50-55% with normal wall motion. Grade 1 diastolic dysfunction. Mitral annular calcification present. Normal RV size and function. Normal pulmonary artery pressure. Pharmacologic MPI (12/14/15): Low risk without ischemia or scar. LVEF 68%. TTE (12/14/15): Normal LV size and wall thickness. LVEF 55-60%. Normal RV size and function. No significant valvular abnormalities.  Recent CV Pertinent Labs: Lab Results  Component Value Date   CHOL 131 09/19/2022   CHOL 119 08/22/2017   HDL 46 09/19/2022   HDL 57 08/22/2017   LDLCALC 59 09/19/2022   LDLCALC 46 08/22/2017   TRIG 132 09/19/2022   CHOLHDL 2.8 09/19/2022   INR 1.0 03/31/2021   BNP 159.1 (H) 04/16/2022   K 4.2 09/19/2022   MG 2.2 04/12/2022   BUN 14 09/19/2022   BUN 20 09/11/2021   CREATININE 0.86 09/19/2022   CREATININE 1.18 (H) 01/07/2017    Past medical and surgical history were reviewed and updated in EPIC.  Current Meds  Medication Sig   acetaminophen (TYLENOL) 500 MG tablet Take 650 mg by mouth every 8 (eight) hours as needed for moderate pain.   alendronate (FOSAMAX) 70 MG tablet Take 70 mg by mouth once a week.    ALPRAZolam (XANAX) 0.5 MG tablet Take 1 tablet (0.5 mg total) by mouth 2 (two) times daily as needed for anxiety.   aspirin EC 81 MG tablet Take 1 tablet (81 mg total) by mouth at bedtime. Swallow whole.   atorvastatin (LIPITOR) 20 MG tablet Take 1 tablet (20 mg total) by mouth daily.   carboxymethylcellulose 1 % ophthalmic solution Apply 1 drop to eye at bedtime.   cetirizine (ZYRTEC) 5 MG tablet Take 5 mg by mouth daily.   Cranberry 450 MG TABS Take 1 tablet by mouth every morning.   Docusate Calcium (STOOL SOFTENER PO) Take by mouth. 2 capsules 2 times daily   HYDROcodone-acetaminophen (NORCO/VICODIN) 5-325 MG tablet Take 1 tablet by mouth every 6 (six) hours as needed for moderate  pain.   hydrocortisone cream 1 % Apply 1 Application topically 2 (two) times daily as needed for itching.   levothyroxine (SYNTHROID) 100 MCG tablet Take 1 tablet (100 mcg total) by mouth daily before breakfast. (Patient taking differently: Take 100 mcg by mouth daily. At bedtime)   losartan (COZAAR) 25 MG tablet Take 0.5 tablets (12.5 mg total) by mouth daily.   melatonin 5 MG TABS Take 1 tablet (5 mg total) by mouth at bedtime.   midodrine (PROAMATINE) 10 MG tablet Take 1 tablet (10 mg total) by mouth 3 (three) times daily with meals.   montelukast (SINGULAIR) 10 MG tablet Take 1 tablet (10 mg total) by mouth at bedtime.   nystatin powder Apply 1 Application topically every 6 (six) hours as needed (groin rash).   omeprazole (PRILOSEC) 40 MG capsule Take 1 capsule (40 mg total) by mouth daily. After lunch   ondansetron (ZOFRAN) 4 MG tablet Take 1 tablet (4 mg total) by mouth every 8 (eight) hours as needed for nausea or vomiting.   oxybutynin (DITROPAN) 5 MG tablet Take 1 tablet (5 mg total) by mouth every 8 (eight) hours as needed for bladder spasms.   polyethylene glycol (MIRALAX / GLYCOLAX) 17 g packet Take 17 g by mouth daily.   POTASSIUM CHLORIDE ER PO Take 20 mEq by mouth in the morning  and at bedtime.   pregabalin (LYRICA) 75 MG capsule Take 75 mg by mouth every morning.   simethicone (MYLICON) 80 MG chewable tablet Chew 80 mg by mouth every 6 (six) hours as needed for flatulence.   traZODone (DESYREL) 100 MG tablet Take 100 mg by mouth at bedtime.   venlafaxine (EFFEXOR) 75 MG tablet Take 75 mg by mouth 2 (two) times daily.    Allergies: Vibegron  Social History   Tobacco Use   Smoking status: Never    Passive exposure: Yes   Smokeless tobacco: Never   Tobacco comments:    husbands and children smoked in home.   Vaping Use   Vaping Use: Never used  Substance Use Topics   Alcohol use: No   Drug use: No    Family History  Problem Relation Age of Onset   Heart disease  Mother    Hypertension Mother    Diabetes Mother    Heart attack Mother 78   Heart disease Father    Heart attack Father 72   Breast cancer Maternal Aunt    Heart attack Brother     Review of Systems: A 12-system review of systems was performed and was negative except as noted in the HPI.  --------------------------------------------------------------------------------------------------  Physical Exam: BP 110/70 (BP Location: Left Arm, Patient Position: Sitting, Cuff Size: Normal)   Pulse 93   Ht 5' 4"$  (1.626 m)   Wt 195 lb (88.5 kg)   SpO2 95%   BMI 33.47 kg/m   General:  NAD. Neck: No JVD or HJR, though body habitus limits evaluation. Lungs: Clear to auscultation bilaterally without wheezes or crackles. Heart: Regular rate and rhythm without murmurs, rubs, or gallops. Abdomen: Soft, nontender, nondistended. Extremities: 1-2+ pitting edema both calves.  Lab Results  Component Value Date   WBC 6.1 05/16/2022   HGB 12.3 05/16/2022   HCT 36.3 05/16/2022   MCV 92.8 05/16/2022   PLT 143 (L) 05/16/2022    Lab Results  Component Value Date   NA 136 09/19/2022   K 4.2 09/19/2022   CL 106 09/19/2022   CO2 25 09/19/2022   BUN 14 09/19/2022   CREATININE 0.86 09/19/2022   GLUCOSE 136 (H) 09/19/2022   ALT 18 05/14/2022    Lab Results  Component Value Date   CHOL 131 09/19/2022   HDL 46 09/19/2022   LDLCALC 59 09/19/2022   TRIG 132 09/19/2022   CHOLHDL 2.8 09/19/2022    --------------------------------------------------------------------------------------------------  ASSESSMENT AND PLAN: Acute on chronic HFrEF: Kimberly Cook appears volume overloaded today and also has put on 4 pounds since last month and 18 pounds since October.  We will restart torsemide 20 mg daily.  Chemistries today were normal.  Continue low-dose losartan.  Given somewhat improved blood pressure since switching from [place], we will decrease midodrine to 5 mg 3 times daily with meals.  Once  euvolemia has been reestablished and blood pressure stabilized (hopefully off midodrine) addition of the beta-blocker could be considered if heart rate allows (bradycardia has limited this in the past though heart rate is in the 90s today).  Coronary artery disease and mixed hyperlipidemia: Nonobstructive CAD noted without angina reported today.  Continue aspirin and statin therapy for secondary prevention.  Headache and weakness: Symptoms are nonspecific.  However, Kimberly Cook is concerned that her headaches seem to have begun after she had a fall in 04/2022 and resultant concussion.  I will refer her to neurology for further evaluation.  Follow-up: Return to clinic in  2 weeks.  Nelva Bush, MD 09/19/2022 11:02 AM

## 2022-09-20 ENCOUNTER — Encounter: Payer: Self-pay | Admitting: Internal Medicine

## 2022-09-20 DIAGNOSIS — I959 Hypotension, unspecified: Secondary | ICD-10-CM | POA: Diagnosis not present

## 2022-09-20 DIAGNOSIS — F32A Depression, unspecified: Secondary | ICD-10-CM | POA: Diagnosis not present

## 2022-09-20 DIAGNOSIS — G629 Polyneuropathy, unspecified: Secondary | ICD-10-CM | POA: Diagnosis not present

## 2022-09-20 DIAGNOSIS — M810 Age-related osteoporosis without current pathological fracture: Secondary | ICD-10-CM | POA: Diagnosis not present

## 2022-09-20 DIAGNOSIS — I5020 Unspecified systolic (congestive) heart failure: Secondary | ICD-10-CM | POA: Diagnosis not present

## 2022-09-20 DIAGNOSIS — K5900 Constipation, unspecified: Secondary | ICD-10-CM | POA: Diagnosis not present

## 2022-09-20 DIAGNOSIS — K219 Gastro-esophageal reflux disease without esophagitis: Secondary | ICD-10-CM | POA: Diagnosis not present

## 2022-09-20 DIAGNOSIS — I2510 Atherosclerotic heart disease of native coronary artery without angina pectoris: Secondary | ICD-10-CM | POA: Diagnosis not present

## 2022-09-25 DIAGNOSIS — H2511 Age-related nuclear cataract, right eye: Secondary | ICD-10-CM | POA: Diagnosis not present

## 2022-09-25 DIAGNOSIS — H2512 Age-related nuclear cataract, left eye: Secondary | ICD-10-CM | POA: Diagnosis not present

## 2022-09-27 ENCOUNTER — Encounter: Payer: Self-pay | Admitting: Cardiology

## 2022-09-27 ENCOUNTER — Ambulatory Visit: Payer: Medicare HMO | Attending: Cardiology | Admitting: Cardiology

## 2022-09-27 NOTE — Progress Notes (Incomplete)
Cardiology Clinic Note   Patient Name: Kimberly Cook Date of Encounter: 09/27/2022  Primary Care Provider:  System, Provider Not In Primary Cardiologist:  Nelva Bush, MD  Patient Profile    ***  Past Medical History    Past Medical History:  Diagnosis Date   Acute respiratory failure, unsp w hypoxia or hypercapnia (HCC)    Allergy    Anxiety    a.) on BZO (alprazolam) PRN   Aortic atherosclerosis (HCC)    Arthritis    Back pain    Chronic HFrEF (heart failure with reduced ejection fraction) ([place])    a. 08/2021 echo: EF 30-35%, glob HK, GrI DD; b. 10/2021 Echo: EF 30-35%.   CKD (chronic kidney disease), stage II    Coronary artery disease    a. Mild to moderate CAD in LAD/diagonal by CTA (CT-FFR of apical LAD 0.79); b. 08/2021 Cath: LM nl, LAD min irregs, D1/2/3 nl, LCX nl, OM2/3 nl, RCA 20p, RPDA mild dzs, RPAV nl.   DDD (degenerative disc disease), lumbar    Depression    Diverticulosis    Essential hypertension    GERD (gastroesophageal reflux disease)    Headache    Hiatal hernia    History of shingles    HLD (hyperlipidemia)    Hypothyroidism    Lung nodule    Mini stroke 2011   Moderate Pericardial effusion    a. 08/2021 Echo: moderate circumferential pericardial effusion w/o tamponade; b. 10/2021 Echo: EF 30-35%, small to mod circumferential pericardial effusion w/o tamponade.   Muscle weakness    NICM (nonischemic cardiomyopathy) ([place])    a. 08/2021 Echo: EF 30-35%, glob HK, GrI DD, nl RV fxn, mild-mod dil LA, mod circumferential pericardial eff w/o tamponade, Mod MR; b. 10/2021 Echo: EF 30-35%, glob HK.   Occipital neuralgia    On supplemental oxygen by nasal cannula    a.) 2L.[place] at bedtime and PRN   Palpitations    Pancytopenia (HCC)    Pleural effusion on left    a. 08/2021 s/p thoracentesis.   Pneumonia 2018   Polyneuropathy    Prediabetes    Renal cyst, left    a.) Renal artery [place] 12/26/2017: measured 2.1 cm.   Sleep difficulties    a.)  takes trazodone + melatonin   Stroke ([place])    MRI 04/2008 + left sup. frontal gyrus possibly puntate infarct    Syncope 2019   Vitamin B deficiency    Vitamin D deficiency    Past Surgical History:  Procedure Laterality Date   ABDOMINAL HYSTERECTOMY     BLADDER SURGERY     2003   BREAST EXCISIONAL BIOPSY Right Over 20 years    Benign   CHOLECYSTECTOMY     CYSTOSCOPY W/ URETERAL STENT PLACEMENT Left 05/13/2022   Procedure: CYSTOSCOPY WITH RETROGRADE PYELOGRAM/URETERAL LEFT STENT PLACEMENT;  Surgeon: Hollice Espy, MD;  Location: [facility];  Service: Urology;  Laterality: Left;   CYSTOSCOPY WITH BIOPSY N/A 05/13/2022   Procedure: CYSTOSCOPY WITH BLADDER BIOPSY;  Surgeon: Hollice Espy, MD;  Location: [facility];  Service: Urology;  Laterality: N/A;   gastroplication      KNEE ARTHROSCOPY Left 2011   PULSE GENERATOR IMPLANT Left 01/31/2020   Procedure: PLACEMENT RIGHT FLANK PULSE GENERATOR VS REMOVAL SPINAL CORD STIMULATOR;  Surgeon: Deetta Perla, MD;  Location: [facility];  Service: Neurosurgery;  Laterality: Left;   PULSE GENERATOR IMPLANT Left 04/24/2020   Procedure: REPLACEMENT LEFT FLANK PULSE GENERATOR IMPLANT;  Surgeon: Lacinda Axon,  Remo Lipps, MD;  Location: [facility];  Service: Neurosurgery;  Laterality: Left;  MAC w/ local   REPLACEMENT TOTAL KNEE Left    right arm fracture     RIGHT/LEFT HEART CATH AND CORONARY ANGIOGRAPHY N/A 08/28/2021   Procedure: RIGHT/LEFT HEART CATH AND CORONARY ANGIOGRAPHY;  Surgeon: Wellington Hampshire, MD;  Location: [facility];  Service: Cardiovascular;  Laterality: N/A;   SPINAL CORD STIMULATOR REMOVAL N/A 06/26/2020   Procedure: SPINAL CORD STIMULATOR REMOVAL;  Surgeon: Deetta Perla, MD;  Location: [facility];  Service: Neurosurgery;  Laterality: N/A;   THORACIC LAMINECTOMY FOR SPINAL CORD STIMULATOR N/A 01/24/2020   Procedure: THORACIC SPINAL CORD STIMULATOR PADDLE TRIAL VIA LAMINECTOMY;  Surgeon: Deetta Perla, MD;  Location: [facility];  Service:  Neurosurgery;  Laterality: N/A;   TONSILLECTOMY AND ADENOIDECTOMY     TOTAL KNEE ARTHROPLASTY Left 04/17/2015   Procedure: LEFT TOTAL KNEE ARTHROPLASTY;  Surgeon: Paralee Cancel, MD;  Location: [facility];  Service: Orthopedics;  Laterality: Left;    Allergies  Allergies  Allergen Reactions   Vibegron Other (See Comments)    Not listed, received from facility paperwork.    History of Present Illness    ***  Home Medications    Current Outpatient Medications  Medication Sig Dispense Refill   acetaminophen (TYLENOL) 500 MG tablet Take 650 mg by mouth every 8 (eight) hours as needed for moderate pain.     alendronate (FOSAMAX) 70 MG tablet Take 70 mg by mouth once a week.      ALPRAZolam (XANAX) 0.5 MG tablet Take 1 tablet (0.5 mg total) by mouth 2 (two) times daily as needed for anxiety. 5 tablet 0   aspirin EC 81 MG tablet Take 1 tablet (81 mg total) by mouth at bedtime. Swallow whole. 90 tablet 3   atorvastatin (LIPITOR) 20 MG tablet Take 1 tablet (20 mg total) by mouth daily. 90 tablet 3   calcium-vitamin D (OSCAL WITH D) 500-5 MG-MCG tablet Take 1 tablet by mouth 2 (two) times daily. (Patient not taking: Reported on 08/21/2022)     carboxymethylcellulose 1 % ophthalmic solution Apply 1 drop to eye at bedtime.     cetirizine (ZYRTEC) 5 MG tablet Take 5 mg by mouth daily.     Cholecalciferol (VITAMIN D3) 125 MCG (5000 UT) TABS Take 1 tablet (5,000 Units total) by mouth daily. (Patient not taking: Reported on 09/19/2022) 90 tablet 3   citalopram (CELEXA) 20 MG tablet Take 20 mg by mouth daily. (Patient not taking: Reported on 08/21/2022)     Cranberry 450 MG TABS Take 1 tablet by mouth every morning.     Docusate Calcium (STOOL SOFTENER PO) Take by mouth. 2 capsules 2 times daily     HYDROcodone-acetaminophen (NORCO/VICODIN) 5-325 MG tablet Take 1 tablet by mouth every 6 (six) hours as needed for moderate pain. 10 tablet 0   hydrocortisone cream 1 % Apply 1 Application topically 2 (two)  times daily as needed for itching.     levothyroxine (SYNTHROID) 100 MCG tablet Take 1 tablet (100 mcg total) by mouth daily before breakfast. (Patient taking differently: Take 100 mcg by mouth daily. At bedtime) 30 tablet 0   losartan (COZAAR) 25 MG tablet Take 0.5 tablets (12.5 mg total) by mouth daily. 45 tablet 3   melatonin 5 MG TABS Take 1 tablet (5 mg total) by mouth at bedtime.  0   midodrine (PROAMATINE) 5 MG tablet Take 1 tablet (5 mg total) by mouth 3 (three) times daily with  meals. 270 tablet 3   montelukast (SINGULAIR) 10 MG tablet Take 1 tablet (10 mg total) by mouth at bedtime. 90 tablet 3   nystatin powder Apply 1 Application topically every 6 (six) hours as needed (groin rash).     omeprazole (PRILOSEC) 40 MG capsule Take 1 capsule (40 mg total) by mouth daily. After lunch 90 capsule 3   ondansetron (ZOFRAN) 4 MG tablet Take 1 tablet (4 mg total) by mouth every 8 (eight) hours as needed for nausea or vomiting. 20 tablet 0   oxybutynin (DITROPAN) 5 MG tablet Take 1 tablet (5 mg total) by mouth every 8 (eight) hours as needed for bladder spasms. 30 tablet 0   polyethylene glycol (MIRALAX / GLYCOLAX) 17 g packet Take 17 g by mouth daily.     POTASSIUM CHLORIDE ER PO Take 20 mEq by mouth in the morning and at bedtime.     pregabalin (LYRICA) 150 MG capsule Take 150 mg by mouth at bedtime. (Patient not taking: Reported on 09/19/2022)     pregabalin (LYRICA) 75 MG capsule Take 75 mg by mouth every morning.     simethicone (MYLICON) 80 MG chewable tablet Chew 80 mg by mouth every 6 (six) hours as needed for flatulence.     torsemide (DEMADEX) 20 MG tablet Take 1 tablet (20 mg total) by mouth daily. 90 tablet 3   traZODone (DESYREL) 100 MG tablet Take 100 mg by mouth at bedtime.     venlafaxine (EFFEXOR) 75 MG tablet Take 75 mg by mouth 2 (two) times daily.     No current facility-administered medications for this visit.     Family History    Family History  Problem Relation Age of  Onset   Heart disease Mother    Hypertension Mother    Diabetes Mother    Heart attack Mother 99   Heart disease Father    Heart attack Father 85   Breast cancer Maternal Aunt    Heart attack Brother    She indicated that her mother is deceased. She indicated that her father is deceased. She indicated that her brother is deceased. She indicated that her maternal aunt is deceased.  Social History    Social History   Socioeconomic History   Marital status: Widowed    Spouse name: Not on file   Number of children: Not on file   Years of education: Not on file   Highest education level: Not on file  Occupational History   Not on file  Tobacco Use   Smoking status: Never    Passive exposure: Yes   Smokeless tobacco: Never   Tobacco comments:    husbands and children smoked in home.   Vaping Use   Vaping Use: Never used  Substance and Sexual Activity   Alcohol use: No   Drug use: No   Sexual activity: Never  Other Topics Concern   Not on file  Social History Narrative   Widowed    Lives at [facility]   Has kids and grandson    Social Determinants of Health   Financial Resource Strain: Low Risk  (07/24/2020)   Overall Financial Resource Strain (CARDIA)    Difficulty of Paying Living Expenses: Not hard at all  Food Insecurity: No Food Insecurity (05/15/2022)   Hunger Vital Sign    Worried About Running Out of Food in the Last Year: Never true    Ran Out of Food in the Last Year: Never true  Transportation  Needs: No Transportation Needs (05/15/2022)   PRAPARE - Hydrologist (Medical): No    Lack of Transportation (Non-Medical): No  Physical Activity: Unknown (07/24/2020)   Exercise Vital Sign    Days of Exercise per Week: 0 days    Minutes of Exercise per Session: Not on file  Stress: No Stress Concern Present (07/24/2020)   [place]    Feeling of Stress : Only a  little  Social Connections: Unknown (07/24/2020)   Social Connection and Isolation Panel [NHANES]    Frequency of Communication with Friends and Family: More than three times a week    Frequency of Social Gatherings with Friends and Family: More than three times a week    Attends Religious Services: Not on file    Active Member of Clubs or Organizations: Not on file    Attends Archivist Meetings: Not on file    Marital Status: Not on file  Intimate Partner Violence: Not At Risk (05/15/2022)   Humiliation, Afraid, Rape, and Kick questionnaire    Fear of Current or Ex-Partner: No    Emotionally Abused: No    Physically Abused: No    Sexually Abused: No     Review of Systems    General:  No chills, fever, night sweats or weight changes.  Cardiovascular:  No chest pain, dyspnea on exertion, edema, orthopnea, palpitations, paroxysmal nocturnal dyspnea. Dermatological: No rash, lesions/masses Respiratory: No cough, dyspnea Urologic: No hematuria, dysuria Abdominal:   No nausea, vomiting, diarrhea, bright red blood per rectum, melena, or hematemesis Neurologic:  No visual changes, wkns, changes in mental status. All other systems reviewed and are otherwise negative except as noted above.     Physical Exam    VS:  There were no vitals taken for this visit. , BMI There is no height or weight on file to calculate BMI.     GEN: Well nourished, well developed, in no acute distress. HEENT: normal. Neck: Supple, no JVD, carotid bruits, or masses. Cardiac: RRR, no murmurs, rubs, or gallops. No clubbing, cyanosis, edema.  Radials 2+/PT 2+ and equal bilaterally.  Respiratory:  Respirations regular and unlabored, clear to auscultation bilaterally. GI: Soft, nontender, nondistended, BS + x 4. MS: no deformity or atrophy. Skin: warm and dry, no rash. Neuro:  Strength and sensation are intact. Psych: Normal affect.  Accessory Clinical Findings    ECG personally reviewed by me  today- *** - No acute changes  Lab Results  Component Value Date   WBC 6.1 05/16/2022   HGB 12.3 05/16/2022   HCT 36.3 05/16/2022   MCV 92.8 05/16/2022   PLT 143 (L) 05/16/2022   Lab Results  Component Value Date   CREATININE 0.86 09/19/2022   BUN 14 09/19/2022   NA 136 09/19/2022   K 4.2 09/19/2022   CL 106 09/19/2022   CO2 25 09/19/2022   Lab Results  Component Value Date   ALT 18 05/14/2022   AST 34 05/14/2022   ALKPHOS 68 05/14/2022   BILITOT 1.0 05/14/2022   Lab Results  Component Value Date   CHOL 131 09/19/2022   HDL 46 09/19/2022   LDLCALC 59 09/19/2022   TRIG 132 09/19/2022   CHOLHDL 2.8 09/19/2022    Lab Results  Component Value Date   HGBA1C 5.1 11/24/2019    Assessment & Plan   1.  ***  Asaad Gulley, NP 09/27/2022, 7:58 AM

## 2022-09-30 DIAGNOSIS — F411 Generalized anxiety disorder: Secondary | ICD-10-CM | POA: Diagnosis not present

## 2022-09-30 DIAGNOSIS — F5101 Primary insomnia: Secondary | ICD-10-CM | POA: Diagnosis not present

## 2022-09-30 DIAGNOSIS — F32A Depression, unspecified: Secondary | ICD-10-CM | POA: Diagnosis not present

## 2022-10-01 ENCOUNTER — Ambulatory Visit: Payer: Medicare HMO | Admitting: Cardiology

## 2022-10-01 ENCOUNTER — Telehealth: Payer: Self-pay | Admitting: Internal Medicine

## 2022-10-01 DIAGNOSIS — G629 Polyneuropathy, unspecified: Secondary | ICD-10-CM | POA: Diagnosis not present

## 2022-10-01 NOTE — Telephone Encounter (Signed)
Gerrie Nordmann NP reviewed information and recommended bmp to be done prior to that appointment.

## 2022-10-01 NOTE — Telephone Encounter (Signed)
Called and spoke with her daughter. She is not on release form so she provided me with her number.

## 2022-10-01 NOTE — Telephone Encounter (Signed)
Called and spoke with patient. Reviewed that provider would like her to have repeat labs done at her convenience. Advised that we do have her on our wait list in the event something comes open. She verbalized understanding with no further questions at this time.

## 2022-10-01 NOTE — Telephone Encounter (Signed)
Pt rescheduled 2/27 to 2/23, no showed  Nothing available for 2 weeks follow up Added to wait list. Please advise about any changes

## 2022-10-02 ENCOUNTER — Other Ambulatory Visit
Admission: RE | Admit: 2022-10-02 | Discharge: 2022-10-02 | Disposition: A | Discharge status: Home / Self Care | Payer: Medicare HMO | Attending: Cardiology | Admitting: Cardiology

## 2022-10-02 ENCOUNTER — Encounter: Payer: Self-pay | Admitting: Ophthalmology

## 2022-10-02 DIAGNOSIS — I5022 Chronic systolic (congestive) heart failure: Secondary | ICD-10-CM | POA: Diagnosis not present

## 2022-10-02 DIAGNOSIS — I428 Other cardiomyopathies: Secondary | ICD-10-CM | POA: Insufficient documentation

## 2022-10-02 DIAGNOSIS — I25118 Atherosclerotic heart disease of native coronary artery with other forms of angina pectoris: Secondary | ICD-10-CM | POA: Diagnosis not present

## 2022-10-02 LAB — BASIC METABOLIC PANEL
Anion gap: 13 (ref 5–15)
BUN: 14 mg/dL (ref 8–23)
CO2: 24 mmol/L (ref 22–32)
Calcium: 9.4 mg/dL (ref 8.9–10.3)
Chloride: 99 mmol/L (ref 98–111)
Creatinine, Ser: 1 mg/dL (ref 0.44–1.00)
GFR, Estimated: 58 mL/min — ABNORMAL LOW (ref >60)
Glucose, Bld: 112 mg/dL — ABNORMAL HIGH (ref 70–99)
Potassium: 3.6 mmol/L (ref 3.5–5.1)
Sodium: 136 mmol/L (ref 135–145)

## 2022-10-07 NOTE — Discharge Instructions (Signed)

## 2022-10-09 ENCOUNTER — Ambulatory Visit
Admission: RE | Admit: 2022-10-09 | Discharge: 2022-10-09 | Disposition: A | Discharge status: Home / Self Care | Payer: Medicare HMO | Attending: Ophthalmology | Admitting: Ophthalmology

## 2022-10-09 ENCOUNTER — Ambulatory Visit: Payer: Medicare HMO | Admitting: Anesthesiology

## 2022-10-09 ENCOUNTER — Encounter: Payer: Self-pay | Admitting: Ophthalmology

## 2022-10-09 ENCOUNTER — Encounter
Admission: RE | Disposition: A | Discharge status: Home / Self Care | Payer: Self-pay | Source: Home / Self Care | Attending: Ophthalmology

## 2022-10-09 ENCOUNTER — Other Ambulatory Visit: Payer: Self-pay

## 2022-10-09 DIAGNOSIS — F32A Depression, unspecified: Secondary | ICD-10-CM | POA: Insufficient documentation

## 2022-10-09 DIAGNOSIS — I130 Hypertensive heart and chronic kidney disease with heart failure and stage 1 through stage 4 chronic kidney disease, or unspecified chronic kidney disease: Secondary | ICD-10-CM | POA: Insufficient documentation

## 2022-10-09 DIAGNOSIS — I700 Atherosclerosis of aorta: Secondary | ICD-10-CM | POA: Insufficient documentation

## 2022-10-09 DIAGNOSIS — H2511 Age-related nuclear cataract, right eye: Secondary | ICD-10-CM | POA: Insufficient documentation

## 2022-10-09 DIAGNOSIS — Z79890 Hormone replacement therapy (postmenopausal): Secondary | ICD-10-CM | POA: Diagnosis not present

## 2022-10-09 DIAGNOSIS — N182 Chronic kidney disease, stage 2 (mild): Secondary | ICD-10-CM | POA: Diagnosis not present

## 2022-10-09 DIAGNOSIS — I5022 Chronic systolic (congestive) heart failure: Secondary | ICD-10-CM | POA: Insufficient documentation

## 2022-10-09 DIAGNOSIS — Z6833 Body mass index (BMI) 33.0-33.9, adult: Secondary | ICD-10-CM | POA: Diagnosis not present

## 2022-10-09 DIAGNOSIS — K219 Gastro-esophageal reflux disease without esophagitis: Secondary | ICD-10-CM | POA: Insufficient documentation

## 2022-10-09 DIAGNOSIS — T7840XA Allergy, unspecified, initial encounter: Secondary | ICD-10-CM | POA: Diagnosis not present

## 2022-10-09 DIAGNOSIS — E669 Obesity, unspecified: Secondary | ICD-10-CM | POA: Diagnosis not present

## 2022-10-09 HISTORY — DX: Dependence on wheelchair: Z99.3

## 2022-10-09 HISTORY — DX: Other complications of anesthesia, initial encounter: T88.59XA

## 2022-10-09 HISTORY — PX: CATARACT EXTRACTION W/PHACO: SHX586

## 2022-10-09 SURGERY — PHACOEMULSIFICATION, CATARACT, WITH IOL INSERTION
Anesthesia: Monitor Anesthesia Care | Site: Eye | Laterality: Right

## 2022-10-09 MED ORDER — ARMC OPHTHALMIC DILATING DROPS
1.0000 | OPHTHALMIC | Status: DC | PRN
  Administered 2022-10-09 (×3): 1 via OPHTHALMIC

## 2022-10-09 MED ORDER — SIGHTPATH DOSE#1 BSS IO SOLN
INTRAOCULAR | Status: DC | PRN
  Administered 2022-10-09: 15 mL

## 2022-10-09 MED ORDER — CEFUROXIME OPHTHALMIC INJECTION 1 MG/0.1 ML
INJECTION | OPHTHALMIC | Status: DC | PRN
  Administered 2022-10-09: .1 mL via INTRACAMERAL

## 2022-10-09 MED ORDER — FENTANYL CITRATE (PF) 100 MCG/2ML IJ SOLN
INTRAMUSCULAR | Status: DC | PRN
  Administered 2022-10-09 (×2): 50 ug via INTRAVENOUS

## 2022-10-09 MED ORDER — SIGHTPATH DOSE#1 BSS IO SOLN
INTRAOCULAR | Status: DC | PRN
  Administered 2022-10-09: 62 mL via OPHTHALMIC

## 2022-10-09 MED ORDER — BRIMONIDINE TARTRATE-TIMOLOL 0.2-0.5 % OP SOLN
OPHTHALMIC | Status: DC | PRN
  Administered 2022-10-09: 1 [drp] via OPHTHALMIC

## 2022-10-09 MED ORDER — SIGHTPATH DOSE#1 BSS IO SOLN
INTRAOCULAR | Status: DC | PRN
  Administered 2022-10-09: 1 mL via INTRAMUSCULAR

## 2022-10-09 MED ORDER — MIDAZOLAM HCL 2 MG/2ML IJ SOLN
INTRAMUSCULAR | Status: DC | PRN
  Administered 2022-10-09: 2 mg via INTRAVENOUS

## 2022-10-09 MED ORDER — TETRACAINE HCL 0.5 % OP SOLN
1.0000 [drp] | OPHTHALMIC | Status: DC | PRN
  Administered 2022-10-09 (×3): 1 [drp] via OPHTHALMIC

## 2022-10-09 MED ORDER — SIGHTPATH DOSE#1 NA HYALUR & NA CHOND-NA HYALUR IO KIT
PACK | INTRAOCULAR | Status: DC | PRN
  Administered 2022-10-09: 1 via OPHTHALMIC

## 2022-10-09 SURGICAL SUPPLY — 10 items
CATARACT SUITE SIGHTPATH (MISCELLANEOUS) ×1 IMPLANT
FEE CATARACT SUITE SIGHTPATH (MISCELLANEOUS) ×1 IMPLANT
GLOVE SRG 8 PF TXTR STRL LF DI (GLOVE) ×1 IMPLANT
GLOVE SURG ENC TEXT LTX SZ7.5 (GLOVE) ×1 IMPLANT
GLOVE SURG UNDER POLY LF SZ8 (GLOVE) ×1
LENS IOL TECNIS EYHANCE 17.0 (Intraocular Lens) IMPLANT
NDL FILTER BLUNT 18X1 1/2 (NEEDLE) ×1 IMPLANT
NEEDLE FILTER BLUNT 18X1 1/2 (NEEDLE) ×1 IMPLANT
SYR 3ML LL SCALE MARK (SYRINGE) ×1 IMPLANT
WATER STERILE IRR 250ML POUR (IV SOLUTION) ×1 IMPLANT

## 2022-10-09 NOTE — Anesthesia Preprocedure Evaluation (Signed)
Anesthesia Evaluation  Patient identified by MRN, date of birth, ID band Patient awake    Reviewed: Allergy & Precautions, NPO status , Patient's Chart, lab work & pertinent test results  History of Anesthesia Complications (+) Emergence Delirium and history of anesthetic complications  Airway Mallampati: III  TM Distance: <3 FB Neck ROM: full    Dental  (+) Upper Dentures, Lower Dentures   Pulmonary neg pulmonary ROS, neg shortness of breath   Pulmonary exam normal        Cardiovascular Exercise Tolerance: Good hypertension, (-) angina + CAD  Normal cardiovascular exam     Neuro/Psych  Headaches PSYCHIATRIC DISORDERS       Neuromuscular disease CVA    GI/Hepatic Neg liver ROS, hiatal hernia,GERD  Controlled,,  Endo/Other  Hypothyroidism    Renal/GU Renal disease     Musculoskeletal   Abdominal   Peds  Hematology negative hematology ROS (+)   Anesthesia Other Findings Past Medical History: No date: Acute respiratory failure, unsp w hypoxia or hypercapnia  (HCC) No date: Allergy No date: Anxiety     Comment:  a.) on BZO (alprazolam) PRN No date: Aortic atherosclerosis (HCC) No date: Arthritis No date: Back pain No date: Chronic HFrEF (heart failure with reduced ejection fraction)  ([place])     Comment:  a. 08/2021 echo: EF 30-35%, glob HK, GrI DD; b. 10/2021               Echo: EF 30-35%. No date: CKD (chronic kidney disease), stage II No date: Complication of anesthesia     Comment:  Slow to clear meds after "14 hour back surgery" No date: Coronary artery disease     Comment:  a. Mild to moderate CAD in LAD/diagonal by CTA (CT-FFR               of apical LAD 0.79); b. 08/2021 Cath: LM nl, LAD min               irregs, D1/2/3 nl, LCX nl, OM2/3 nl, RCA 20p, RPDA mild               dzs, RPAV nl. No date: DDD (degenerative disc disease), lumbar No date: Depression No date: Diverticulosis No date: Essential  hypertension No date: GERD (gastroesophageal reflux disease) No date: Headache No date: Hiatal hernia No date: History of shingles No date: HLD (hyperlipidemia) No date: Hypothyroidism No date: Lung nodule 2011: Mini stroke No date: Moderate Pericardial effusion     Comment:  a. 08/2021 Echo: moderate circumferential pericardial               effusion w/o tamponade; b. 10/2021 Echo: EF 30-35%, small               to mod circumferential pericardial effusion w/o               tamponade. No date: Muscle weakness No date: NICM (nonischemic cardiomyopathy) ([place])     Comment:  a. 08/2021 Echo: EF 30-35%, glob HK, GrI DD, nl RV fxn,               mild-mod dil LA, mod circumferential pericardial eff w/o               tamponade, Mod MR; b. 10/2021 Echo: EF 30-35%, glob HK. No date: Occipital neuralgia No date: On supplemental oxygen by nasal cannula     Comment:  a.) 2L.[place] at bedtime and PRN No date: Palpitations No date: Pancytopenia (HCC) No  date: Pleural effusion on left     Comment:  a. 08/2021 s/p thoracentesis. 2018: Pneumonia No date: Polyneuropathy No date: Prediabetes No date: Renal cyst, left     Comment:  a.) Renal artery [place] 12/26/2017: measured 2.1 cm. No date: Sleep difficulties     Comment:  a.) takes trazodone + melatonin No date: Stroke [facility])     Comment:  MRI 04/2008 + left sup. frontal gyrus possibly puntate               infarct  2019: Syncope No date: Uses wheelchair     Comment:  able to stand and self transfer No date: Vitamin B deficiency No date: Vitamin D deficiency  Past Surgical History: No date: ABDOMINAL HYSTERECTOMY No date: BLADDER SURGERY     Comment:  2003 Over 20 years : BREAST EXCISIONAL BIOPSY; Right     Comment:  Benign No date: CHOLECYSTECTOMY 05/13/2022: CYSTOSCOPY W/ URETERAL STENT PLACEMENT; Left     Comment:  Procedure: CYSTOSCOPY WITH RETROGRADE PYELOGRAM/URETERAL              LEFT STENT PLACEMENT;  Surgeon: Hollice Espy, MD;                 Location: [facility];  Service: Urology;  Laterality: Left; 05/13/2022: CYSTOSCOPY WITH BIOPSY; N/A     Comment:  Procedure: CYSTOSCOPY WITH BLADDER BIOPSY;  Surgeon:               Hollice Espy, MD;  Location: [facility];  Service:               Urology;  Laterality: N/A; No date: gastroplication  [id number]: KNEE ARTHROSCOPY; Left 01/31/2020: PULSE GENERATOR IMPLANT; Left     Comment:  Procedure: PLACEMENT RIGHT FLANK PULSE GENERATOR VS               REMOVAL SPINAL CORD STIMULATOR;  Surgeon: Deetta Perla,               MD;  Location: [facility];  Service: Neurosurgery;                Laterality: Left; 04/24/2020: PULSE GENERATOR IMPLANT; Left     Comment:  Procedure: REPLACEMENT LEFT FLANK PULSE GENERATOR               IMPLANT;  Surgeon: Deetta Perla, MD;  Location: [facility];              Service: Neurosurgery;  Laterality: Left;  MAC w/ local No date: REPLACEMENT TOTAL KNEE; Left No date: right arm fracture 08/28/2021: RIGHT/LEFT HEART CATH AND CORONARY ANGIOGRAPHY; N/A     Comment:  Procedure: RIGHT/LEFT HEART CATH AND CORONARY               ANGIOGRAPHY;  Surgeon: Wellington Hampshire, MD;  Location:               [place] [facility];  Service: Cardiovascular;                Laterality: N/A; 06/26/2020: SPINAL CORD STIMULATOR REMOVAL; N/A     Comment:  Procedure: SPINAL CORD STIMULATOR REMOVAL;  Surgeon:               Deetta Perla, MD;  Location: [facility];  Service:               Neurosurgery;  Laterality: N/A; 01/24/2020: THORACIC LAMINECTOMY FOR SPINAL CORD STIMULATOR; N/A     Comment:  Procedure: THORACIC SPINAL CORD  STIMULATOR PADDLE TRIAL               VIA LAMINECTOMY;  Surgeon: Deetta Perla, MD;  Location:               [facility];  Service: Neurosurgery;  Laterality: N/A; No date: TONSILLECTOMY AND ADENOIDECTOMY 04/17/2015: TOTAL KNEE ARTHROPLASTY; Left     Comment:  Procedure: LEFT TOTAL KNEE ARTHROPLASTY;  Surgeon:               Paralee Cancel, MD;  Location: [facility];  Service:                Orthopedics;  Laterality: Left;  BMI    Body Mass Index: 33.64 kg/m      Reproductive/Obstetrics negative OB ROS                             Anesthesia Physical Anesthesia Plan  ASA: 3  Anesthesia Plan: MAC   Post-op Pain Management:    Induction: Intravenous  PONV Risk Score and Plan:   Airway Management Planned: Natural Airway and Nasal Cannula  Additional Equipment:   Intra-op Plan:   Post-operative Plan:   Informed Consent: I have reviewed the patients History and Physical, chart, labs and discussed the procedure including the risks, benefits and alternatives for the proposed anesthesia with the patient or authorized representative who has indicated his/her understanding and acceptance.     Dental Advisory Given  Plan Discussed with: Anesthesiologist, CRNA and Surgeon  Anesthesia Plan Comments: (Patient consented for risks of anesthesia including but not limited to:  - adverse reactions to medications - damage to eyes, teeth, lips or other oral mucosa - nerve damage due to positioning  - sore throat or hoarseness - Damage to heart, brain, nerves, lungs, other parts of body or loss of life  Patient voiced understanding.)       Anesthesia Quick Evaluation

## 2022-10-09 NOTE — Transfer of Care (Signed)
Immediate Anesthesia Transfer of Care Note  Patient: Kimberly Cook  Procedure(s) Performed: CATARACT EXTRACTION PHACO AND INTRAOCULAR LENS PLACEMENT (IOC) RIGHT  8.13  00:43.5 (Right: Eye)  Patient Location: PACU  Anesthesia Type: MAC  Level of Consciousness: awake, alert  and patient cooperative  Airway and Oxygen Therapy: Patient Spontanous Breathing and Patient connected to supplemental oxygen  Post-op Assessment: Post-op Vital signs reviewed, Patient's Cardiovascular Status Stable, Respiratory Function Stable, Patent Airway and No signs of Nausea or vomiting  Post-op Vital Signs: Reviewed and stable  Complications: No notable events documented.

## 2022-10-09 NOTE — Op Note (Signed)
LOCATION:  [place]   PREOPERATIVE DIAGNOSIS:    Nuclear sclerotic cataract right eye. H25.11   POSTOPERATIVE DIAGNOSIS:  Nuclear sclerotic cataract right eye.     PROCEDURE:  Phacoemusification with posterior chamber intraocular lens placement of the right eye   ULTRASOUND TIME: Procedure(s): CATARACT EXTRACTION PHACO AND INTRAOCULAR LENS PLACEMENT (IOC) RIGHT  8.13  00:43.5 (Right)  LENS:   Implant Name Type Inv. Item Serial No. Manufacturer Lot No. LRB No. Used Action  LENS IOL TECNIS EYHANCE 17.0 - [id number] Intraocular Lens LENS IOL TECNIS EYHANCE 17.0 [id number] [employer]  Right 1 Implanted         SURGEON:  Wyonia Hough, MD   ANESTHESIA:  Topical with tetracaine drops and 2% Xylocaine jelly, augmented with 1% preservative-free intracameral lidocaine.    COMPLICATIONS:  None.   DESCRIPTION OF PROCEDURE:  The patient was identified in the holding room and transported to the operating room and placed in the supine position under the operating microscope.  The right eye was identified as the operative eye and it was prepped and draped in the usual sterile ophthalmic fashion.   A 1 millimeter clear-corneal paracentesis was made at the 12:00 position.  0.5 ml of preservative-free 1% lidocaine was injected into the anterior chamber. The anterior chamber was filled with Viscoat viscoelastic.  A 2.4 millimeter keratome was used to make a near-clear corneal incision at the 9:00 position.  A curvilinear capsulorrhexis was made with a cystotome and capsulorrhexis forceps.  Balanced salt solution was used to hydrodissect and hydrodelineate the nucleus.   Phacoemulsification was then used in stop and chop fashion to remove the lens nucleus and epinucleus.  The remaining cortex was then removed using the irrigation and aspiration handpiece. Provisc was then placed into the capsular bag to distend it for lens placement.  A lens was then injected into the capsular bag.   The remaining viscoelastic was aspirated.   Wounds were hydrated with balanced salt solution.  The anterior chamber was inflated to a physiologic pressure with balanced salt solution.  No wound leaks were noted. Cefuroxime 0.1 ml of a '10mg'$ /ml solution was injected into the anterior chamber for a dose of 1 mg of intracameral antibiotic at the completion of the case.    Timolol and Brimonidine drops were applied to the eye.  The patient was taken to the recovery room in stable condition without complications of anesthesia or surgery.   Rayvn Rickerson 10/09/2022, 2:16 PM

## 2022-10-09 NOTE — H&P (Signed)
[facility]   Primary Care Physician:  System, Provider Not In Ophthalmologist: Dr. Leandrew Koyanagi  Pre-Procedure History & Physical: HPI:  Kimberly Cook is a 77 y.o. female here for ophthalmic surgery.   Past Medical History:  Diagnosis Date   Acute respiratory failure, unsp w hypoxia or hypercapnia (HCC)    Allergy    Anxiety    a.) on BZO (alprazolam) PRN   Aortic atherosclerosis (HCC)    Arthritis    Back pain    Chronic HFrEF (heart failure with reduced ejection fraction) ([place])    a. 08/2021 echo: EF 30-35%, glob HK, GrI DD; b. 10/2021 Echo: EF 30-35%.   CKD (chronic kidney disease), stage II    Complication of anesthesia    Slow to clear meds after "14 hour back surgery"   Coronary artery disease    a. Mild to moderate CAD in LAD/diagonal by CTA (CT-FFR of apical LAD 0.79); b. 08/2021 Cath: LM nl, LAD min irregs, D1/2/3 nl, LCX nl, OM2/3 nl, RCA 20p, RPDA mild dzs, RPAV nl.   DDD (degenerative disc disease), lumbar    Depression    Diverticulosis    Essential hypertension    GERD (gastroesophageal reflux disease)    Headache    Hiatal hernia    History of shingles    HLD (hyperlipidemia)    Hypothyroidism    Lung nodule    Mini stroke 2011   Moderate Pericardial effusion    a. 08/2021 Echo: moderate circumferential pericardial effusion w/o tamponade; b. 10/2021 Echo: EF 30-35%, small to mod circumferential pericardial effusion w/o tamponade.   Muscle weakness    NICM (nonischemic cardiomyopathy) ([place])    a. 08/2021 Echo: EF 30-35%, glob HK, GrI DD, nl RV fxn, mild-mod dil LA, mod circumferential pericardial eff w/o tamponade, Mod MR; b. 10/2021 Echo: EF 30-35%, glob HK.   Occipital neuralgia    On supplemental oxygen by nasal cannula    a.) 2L.[place] at bedtime and PRN   Palpitations    Pancytopenia (HCC)    Pleural effusion on left    a. 08/2021 s/p thoracentesis.   Pneumonia 2018   Polyneuropathy    Prediabetes    Renal cyst, left    a.) Renal artery  [place] 12/26/2017: measured 2.1 cm.   Sleep difficulties    a.) takes trazodone + melatonin   Stroke ([place])    MRI 04/2008 + left sup. frontal gyrus possibly puntate infarct    Syncope 2019   Uses wheelchair    able to stand and self transfer   Vitamin B deficiency    Vitamin D deficiency     Past Surgical History:  Procedure Laterality Date   ABDOMINAL HYSTERECTOMY     BLADDER SURGERY     2003   BREAST EXCISIONAL BIOPSY Right Over 20 years    Benign   CHOLECYSTECTOMY     CYSTOSCOPY W/ URETERAL STENT PLACEMENT Left 05/13/2022   Procedure: CYSTOSCOPY WITH RETROGRADE PYELOGRAM/URETERAL LEFT STENT PLACEMENT;  Surgeon: Hollice Espy, MD;  Location: [facility];  Service: Urology;  Laterality: Left;   CYSTOSCOPY WITH BIOPSY N/A 05/13/2022   Procedure: CYSTOSCOPY WITH BLADDER BIOPSY;  Surgeon: Hollice Espy, MD;  Location: [facility];  Service: Urology;  Laterality: N/A;   gastroplication      KNEE ARTHROSCOPY Left 2011   PULSE GENERATOR IMPLANT Left 01/31/2020   Procedure: PLACEMENT RIGHT FLANK PULSE GENERATOR VS REMOVAL SPINAL CORD STIMULATOR;  Surgeon: Deetta Perla, MD;  Location: [facility];  Service:  Neurosurgery;  Laterality: Left;   PULSE GENERATOR IMPLANT Left 04/24/2020   Procedure: REPLACEMENT LEFT FLANK PULSE GENERATOR IMPLANT;  Surgeon: Deetta Perla, MD;  Location: [facility];  Service: Neurosurgery;  Laterality: Left;  MAC w/ local   REPLACEMENT TOTAL KNEE Left    right arm fracture     RIGHT/LEFT HEART CATH AND CORONARY ANGIOGRAPHY N/A 08/28/2021   Procedure: RIGHT/LEFT HEART CATH AND CORONARY ANGIOGRAPHY;  Surgeon: Wellington Hampshire, MD;  Location: [place] [facility];  Service: Cardiovascular;  Laterality: N/A;   SPINAL CORD STIMULATOR REMOVAL N/A 06/26/2020   Procedure: SPINAL CORD STIMULATOR REMOVAL;  Surgeon: Deetta Perla, MD;  Location: [facility];  Service: Neurosurgery;  Laterality: N/A;   THORACIC LAMINECTOMY FOR SPINAL CORD STIMULATOR N/A 01/24/2020   Procedure: THORACIC  SPINAL CORD STIMULATOR PADDLE TRIAL VIA LAMINECTOMY;  Surgeon: Deetta Perla, MD;  Location: [facility];  Service: Neurosurgery;  Laterality: N/A;   TONSILLECTOMY AND ADENOIDECTOMY     TOTAL KNEE ARTHROPLASTY Left 04/17/2015   Procedure: LEFT TOTAL KNEE ARTHROPLASTY;  Surgeon: Paralee Cancel, MD;  Location: [facility];  Service: Orthopedics;  Laterality: Left;    Prior to Admission medications   Medication Sig Start Date End Date Taking? Authorizing Provider  acetaminophen (TYLENOL) 500 MG tablet Take 650 mg by mouth every 8 (eight) hours as needed for moderate pain.   Yes [provider]  alendronate (FOSAMAX) 70 MG tablet Take 70 mg by mouth once a week.  10/23/19  Yes [provider]  ALPRAZolam Duanne Moron) 0.5 MG tablet Take 1 tablet (0.5 mg total) by mouth 2 (two) times daily as needed for anxiety. 03/06/22  Yes Wieting, Richard, MD  aspirin EC 81 MG tablet Take 1 tablet (81 mg total) by mouth at bedtime. Swallow whole. 10/25/20  Yes McLean-Scocuzza, Nino Glow, MD  atorvastatin (LIPITOR) 20 MG tablet Take 1 tablet (20 mg total) by mouth daily. 06/20/20  Yes McLean-Scocuzza, Nino Glow, MD  calcium-vitamin D (OSCAL WITH D) 500-5 MG-MCG tablet Take 1 tablet by mouth 2 (two) times daily.   Yes [provider]  carboxymethylcellulose 1 % ophthalmic solution Apply 1 drop to eye at bedtime.   Yes [provider]  cetirizine (ZYRTEC) 5 MG tablet Take 5 mg by mouth daily.   Yes [provider]  Cholecalciferol (VITAMIN D3) 125 MCG (5000 UT) TABS Take 1 tablet (5,000 Units total) by mouth daily. 10/09/18  Yes McLean-Scocuzza, Nino Glow, MD  Cranberry 450 MG TABS Take 1 tablet by mouth every morning. 01/03/22  Yes [provider]  Docusate Calcium (STOOL SOFTENER PO) Take by mouth. 2 capsules 2 times daily   Yes [provider]  HYDROcodone-acetaminophen (NORCO/VICODIN) 5-325 MG tablet Take 1 tablet by mouth every 6 (six) hours as needed for moderate pain. 05/13/22   Yes Hollice Espy, MD  hydrocortisone cream 1 % Apply 1 Application topically 2 (two) times daily as needed for itching.   Yes [provider]  levothyroxine (SYNTHROID) 100 MCG tablet Take 1 tablet (100 mcg total) by mouth daily before breakfast. Patient taking differently: Take 100 mcg by mouth daily. At bedtime 03/06/22  Yes Wieting, Richard, MD  losartan (COZAAR) 25 MG tablet Take 0.5 tablets (12.5 mg total) by mouth daily. 08/21/22 08/16/23 Yes End, Harrell Gave, MD  melatonin 5 MG TABS Take 1 tablet (5 mg total) by mouth at bedtime. 04/04/21  Yes Val Riles, MD  midodrine (PROAMATINE) 5 MG tablet Take 1 tablet (5 mg total) by mouth 3 (three) times  daily with meals. 09/19/22  Yes End, Harrell Gave, MD  montelukast (SINGULAIR) 10 MG tablet Take 1 tablet (10 mg total) by mouth at bedtime. 02/14/20  Yes McLean-Scocuzza, Nino Glow, MD  nystatin powder Apply 1 Application topically every 6 (six) hours as needed (groin rash).   Yes [provider]  omeprazole (PRILOSEC) 40 MG capsule Take 1 capsule (40 mg total) by mouth daily. After lunch 06/23/20  Yes McLean-Scocuzza, Nino Glow, MD  ondansetron (ZOFRAN) 4 MG tablet Take 1 tablet (4 mg total) by mouth every 8 (eight) hours as needed for nausea or vomiting. 05/16/22  Yes [name], MD  oxybutynin (DITROPAN) 5 MG tablet Take 1 tablet (5 mg total) by mouth every 8 (eight) hours as needed for bladder spasms. 05/13/22  Yes Hollice Espy, MD  OXYGEN Inhale into the lungs. Uses for exertion   Yes [provider]  polyethylene glycol (MIRALAX / GLYCOLAX) 17 g packet Take 17 g by mouth daily.   Yes [provider]  POTASSIUM CHLORIDE ER PO Take 20 mEq by mouth in the morning and at bedtime.   Yes [provider]  pregabalin (LYRICA) 150 MG capsule Take 150 mg by mouth at bedtime.   Yes [provider]  pregabalin (LYRICA) 75 MG capsule Take 75 mg by mouth every morning.   Yes [provider]   simethicone (MYLICON) 80 MG chewable tablet Chew 80 mg by mouth every 6 (six) hours as needed for flatulence.   Yes [provider]  torsemide (DEMADEX) 20 MG tablet Take 1 tablet (20 mg total) by mouth daily. 09/19/22  Yes End, Harrell Gave, MD  traZODone (DESYREL) 100 MG tablet Take 100 mg by mouth at bedtime. 12/13/21  Yes [provider]  venlafaxine (EFFEXOR) 75 MG tablet Take 75 mg by mouth 2 (two) times daily.   Yes [provider]  White Petrolatum-Mineral Oil (ARTIFICIAL TEARS) ointment at bedtime.   Yes [provider]  citalopram (CELEXA) 20 MG tablet Take 20 mg by mouth daily. Patient not taking: Reported on 08/21/2022 01/10/22   [provider]    Allergies as of 09/18/2022   (No Known Allergies)    Family History  Problem Relation Age of Onset   Heart disease Mother    Hypertension Mother    Diabetes Mother    Heart attack Mother 20   Heart disease Father    Heart attack Father 38   Breast cancer Maternal Aunt    Heart attack Brother     Social History   Socioeconomic History   Marital status: Widowed    Spouse name: Not on file   Number of children: Not on file   Years of education: Not on file   Highest education level: Not on file  Occupational History   Not on file  Tobacco Use   Smoking status: Never    Passive exposure: Yes   Smokeless tobacco: Never   Tobacco comments:    husbands and children smoked in home.   Vaping Use   Vaping Use: Never used  Substance and Sexual Activity   Alcohol use: No   Drug use: No   Sexual activity: Never  Other Topics Concern   Not on file  Social History Narrative   Widowed    L[facility] at [facility]   Has kids and grandson    Social Determinants of Health   Financial Resource Strain: Low Risk  (07/24/2020)   Overall Financial Resource Strain (CARDIA)  Difficulty of Paying Living Expenses: Not hard at all  Food Insecurity: No Food Insecurity (05/15/2022)   Hunger  Vital Sign    Worried About Running Out of Food in the Last Year: Never true    Ran Out of Food in the Last Year: Never true  Transportation Needs: No Transportation Needs (05/15/2022)   PRAPARE - Hydrologist (Medical): No    Lack of Transportation (Non-Medical): No  Physical Activity: Unknown (07/24/2020)   Exercise Vital Sign    Days of Exercise per Week: 0 days    Minutes of Exercise per Session: Not on file  Stress: No Stress Concern Present (07/24/2020)   [place]    Feeling of Stress : Only a little  Social Connections: Unknown (07/24/2020)   Social Connection and Isolation Panel [NHANES]    Frequency of Communication with Friends and Family: More than three times a week    Frequency of Social Gatherings with Friends and Family: More than three times a week    Attends Religious Services: Not on file    Active Member of [place] or Organizations: Not on file    Attends Archivist Meetings: Not on file    Marital Status: Not on file  Intimate Partner Violence: Not At Risk (05/15/2022)   Humiliation, Afraid, Rape, and Kick questionnaire    Fear of Current or Ex-Partner: No    Emotionally Abused: No    Physically Abused: No    Sexually Abused: No    Review of Systems: See HPI, otherwise negative ROS  Physical Exam: BP 115/84   Pulse 77   Temp 97.9 F (36.6 C) (Temporal)   Resp 20   Ht '5\' 4"'$  (1.626 m)   Wt 88.9 kg   SpO2 99%   BMI 33.64 kg/m  General:   Alert,  pleasant and cooperative in NAD Head:  Normocephalic and atraumatic. Lungs:  Clear to auscultation.    Heart:  Regular rate and rhythm.   Impression/Plan: Kimberly Cook is here for ophthalmic surgery.  Risks, benefits, limitations, and alternatives regarding ophthalmic surgery have been reviewed with the patient.  Questions have been answered.  All parties agreeable.   Leandrew Koyanagi, MD   10/09/2022, 12:59 PM

## 2022-10-09 NOTE — Anesthesia Postprocedure Evaluation (Signed)
Anesthesia Post Note  Patient: Kimberly Cook  Procedure(s) Performed: CATARACT EXTRACTION PHACO AND INTRAOCULAR LENS PLACEMENT (IOC) RIGHT  8.13  00:43.5 (Right: Eye)  Patient location during evaluation: PACU Anesthesia Type: MAC Level of consciousness: awake and alert Pain management: pain level controlled Vital Signs Assessment: post-procedure vital signs reviewed and stable Respiratory status: spontaneous breathing, nonlabored ventilation, respiratory function stable and patient connected to nasal cannula oxygen Cardiovascular status: blood pressure returned to baseline and stable Postop Assessment: no apparent nausea or vomiting Anesthetic complications: no   No notable events documented.   Last Vitals:  Vitals:   10/09/22 1421 10/09/22 1427  BP:  (!) 146/83  Pulse: 86 79  Resp: 14 20  Temp:    SpO2: 96% 97%    Last Pain:  Vitals:   10/09/22 1421  TempSrc:   PainSc: 0-No pain                 [name]

## 2022-10-10 ENCOUNTER — Encounter: Payer: Self-pay | Admitting: Ophthalmology

## 2022-10-14 DIAGNOSIS — M79606 Pain in leg, unspecified: Secondary | ICD-10-CM | POA: Diagnosis not present

## 2022-10-14 DIAGNOSIS — M79662 Pain in left lower leg: Secondary | ICD-10-CM | POA: Diagnosis not present

## 2022-10-14 DIAGNOSIS — R609 Edema, unspecified: Secondary | ICD-10-CM | POA: Diagnosis not present

## 2022-10-14 DIAGNOSIS — G629 Polyneuropathy, unspecified: Secondary | ICD-10-CM | POA: Diagnosis not present

## 2022-10-16 DIAGNOSIS — F411 Generalized anxiety disorder: Secondary | ICD-10-CM | POA: Diagnosis not present

## 2022-10-16 DIAGNOSIS — F32A Depression, unspecified: Secondary | ICD-10-CM | POA: Diagnosis not present

## 2022-10-17 ENCOUNTER — Encounter: Payer: Self-pay | Admitting: Family

## 2022-10-17 ENCOUNTER — Encounter: Payer: Self-pay | Admitting: Ophthalmology

## 2022-10-17 ENCOUNTER — Ambulatory Visit: Payer: Medicare HMO | Attending: Family | Admitting: Family

## 2022-10-17 ENCOUNTER — Other Ambulatory Visit: Payer: Self-pay

## 2022-10-17 VITALS — BP 141/85 | HR 88 | Wt 194.0 lb

## 2022-10-17 DIAGNOSIS — E785 Hyperlipidemia, unspecified: Secondary | ICD-10-CM | POA: Diagnosis not present

## 2022-10-17 DIAGNOSIS — R5383 Other fatigue: Secondary | ICD-10-CM | POA: Diagnosis not present

## 2022-10-17 DIAGNOSIS — I130 Hypertensive heart and chronic kidney disease with heart failure and stage 1 through stage 4 chronic kidney disease, or unspecified chronic kidney disease: Secondary | ICD-10-CM | POA: Diagnosis not present

## 2022-10-17 DIAGNOSIS — R0602 Shortness of breath: Secondary | ICD-10-CM | POA: Diagnosis not present

## 2022-10-17 DIAGNOSIS — Z79899 Other long term (current) drug therapy: Secondary | ICD-10-CM | POA: Diagnosis not present

## 2022-10-17 DIAGNOSIS — F32A Depression, unspecified: Secondary | ICD-10-CM | POA: Diagnosis not present

## 2022-10-17 DIAGNOSIS — J90 Pleural effusion, not elsewhere classified: Secondary | ICD-10-CM | POA: Insufficient documentation

## 2022-10-17 DIAGNOSIS — I5022 Chronic systolic (congestive) heart failure: Secondary | ICD-10-CM | POA: Diagnosis not present

## 2022-10-17 DIAGNOSIS — K219 Gastro-esophageal reflux disease without esophagitis: Secondary | ICD-10-CM | POA: Insufficient documentation

## 2022-10-17 DIAGNOSIS — F419 Anxiety disorder, unspecified: Secondary | ICD-10-CM | POA: Diagnosis not present

## 2022-10-17 DIAGNOSIS — N189 Chronic kidney disease, unspecified: Secondary | ICD-10-CM | POA: Insufficient documentation

## 2022-10-17 DIAGNOSIS — I2510 Atherosclerotic heart disease of native coronary artery without angina pectoris: Secondary | ICD-10-CM | POA: Diagnosis not present

## 2022-10-17 DIAGNOSIS — Z7982 Long term (current) use of aspirin: Secondary | ICD-10-CM | POA: Diagnosis not present

## 2022-10-17 DIAGNOSIS — Z8673 Personal history of transient ischemic attack (TIA), and cerebral infarction without residual deficits: Secondary | ICD-10-CM | POA: Diagnosis not present

## 2022-10-17 DIAGNOSIS — I10 Essential (primary) hypertension: Secondary | ICD-10-CM

## 2022-10-17 DIAGNOSIS — E559 Vitamin D deficiency, unspecified: Secondary | ICD-10-CM | POA: Diagnosis not present

## 2022-10-17 DIAGNOSIS — R490 Dysphonia: Secondary | ICD-10-CM | POA: Diagnosis not present

## 2022-10-17 DIAGNOSIS — G629 Polyneuropathy, unspecified: Secondary | ICD-10-CM | POA: Diagnosis not present

## 2022-10-17 NOTE — Progress Notes (Signed)
Patient ID: Kimberly Cook, female    DOB: [birth year], 77 y.o.   MRN: [id number]  HPI  Kimberly Cook is a 77 y/o female with a history of CAD, hyperlipidemia, HTN, CKD, stroke, syncope, thyroid disease, anxiety, DDD, depression, diverticulosis, GERD, occipital neuralgia, vitamin D deficiency and chronic heart failure.   Echo report from 03/01/22 reviewed and showed an EF of 45-50%. Echo report from 10/05/21 reviewed and showed an EF of 30-35% with small/moderate pleural effusion. Echo report from 08/26/21 reviewed and showed an EF of 30-35% along with moderate LAE and moderate MR.   RHC/LHC done 08/28/21 and showed: Prox RCA lesion is 20% stenosed.   1.  Mild nonobstructive coronary artery disease. 2.  Left ventricular angiography was not performed.  EF was 30 to 35% by echo. 3.  Right heart catheterization showed mildly elevated right and left filling pressures, mild pulmonary hypertension and mildly reduced cardiac output.  Admitted 05/14/22 due to nausea vomiting and urinary burning. Antibiotics started. Discharged after 2 days.   Kimberly Cook presents today for a HF follow-up visit with a chief complaint of moderate SOB w/ minimal exertion. Chronic in nature. Associated decreased appetite, fatigue, cough, intermittent chest pain, pedal edema, constant headaches, light-headedness, weakness and anxiety along with this. Denies difficulty sleeping, abdominal distention, palpitations or wheezing.   Being weighed daily and wearing TED socks daily. Her biggest complaint is of this burning sensation over her esophagus and then seems to be making her throat sore or become hoarse.   Could not tolerate entresto as her BP got low so was placed back on losartan  Past Medical History:  Diagnosis Date   Acute respiratory failure, unsp w hypoxia or hypercapnia (HCC)    Allergy    Anxiety    a.) on BZO (alprazolam) PRN   Aortic atherosclerosis (HCC)    Arthritis    Back pain    Chronic HFrEF (heart failure  with reduced ejection fraction) ([place])    a. 08/2021 echo: EF 30-35%, glob HK, GrI DD; b. 10/2021 Echo: EF 30-35%.   CKD (chronic kidney disease), stage II    Complication of anesthesia    Slow to clear meds after "14 hour back surgery"   Coronary artery disease    a. Mild to moderate CAD in LAD/diagonal by CTA (CT-FFR of apical LAD 0.79); b. 08/2021 Cath: LM nl, LAD min irregs, D1/2/3 nl, LCX nl, OM2/3 nl, RCA 20p, RPDA mild dzs, RPAV nl.   DDD (degenerative disc disease), lumbar    Depression    Diverticulosis    Essential hypertension    GERD (gastroesophageal reflux disease)    Headache    Hiatal hernia    History of shingles    HLD (hyperlipidemia)    Hypothyroidism    Lung nodule    Mini stroke 2011   Moderate Pericardial effusion    a. 08/2021 Echo: moderate circumferential pericardial effusion w/o tamponade; b. 10/2021 Echo: EF 30-35%, small to mod circumferential pericardial effusion w/o tamponade.   Muscle weakness    NICM (nonischemic cardiomyopathy) ([place])    a. 08/2021 Echo: EF 30-35%, glob HK, GrI DD, nl RV fxn, mild-mod dil LA, mod circumferential pericardial eff w/o tamponade, Mod MR; b. 10/2021 Echo: EF 30-35%, glob HK.   Occipital neuralgia    On supplemental oxygen by nasal cannula    a.) 2L.[place] at bedtime and PRN   Palpitations    Pancytopenia (HCC)    Pleural effusion on left    a. 08/2021  s/p thoracentesis.   Pneumonia 2018   Polyneuropathy    Prediabetes    Renal cyst, left    a.) Renal artery [place] 12/26/2017: measured 2.1 cm.   Sleep difficulties    a.) takes trazodone + melatonin   Stroke ([place])    MRI 04/2008 + left sup. frontal gyrus possibly puntate infarct    Syncope 2019   Uses wheelchair    able to stand and self transfer   Vitamin B deficiency    Vitamin D deficiency    Past Surgical History:  Procedure Laterality Date   ABDOMINAL HYSTERECTOMY     BLADDER SURGERY     2003   BREAST EXCISIONAL BIOPSY Right Over 20 years    Benign   CATARACT  EXTRACTION W/PHACO Right 10/09/2022   Procedure: CATARACT EXTRACTION PHACO AND INTRAOCULAR LENS PLACEMENT ([place]) RIGHT  8.13  00:43.5;  Surgeon: Leandrew Koyanagi, MD;  Location: [facility];  Service: Ophthalmology;  Laterality: Right;   CHOLECYSTECTOMY     CYSTOSCOPY W/ URETERAL STENT PLACEMENT Left 05/13/2022   Procedure: CYSTOSCOPY WITH RETROGRADE PYELOGRAM/URETERAL LEFT STENT PLACEMENT;  Surgeon: Hollice Espy, MD;  Location: [facility];  Service: Urology;  Laterality: Left;   CYSTOSCOPY WITH BIOPSY N/A 05/13/2022   Procedure: CYSTOSCOPY WITH BLADDER BIOPSY;  Surgeon: Hollice Espy, MD;  Location: [facility];  Service: Urology;  Laterality: N/A;   gastroplication      KNEE ARTHROSCOPY Left 2011   PULSE GENERATOR IMPLANT Left 01/31/2020   Procedure: PLACEMENT RIGHT FLANK PULSE GENERATOR VS REMOVAL SPINAL CORD STIMULATOR;  Surgeon: Deetta Perla, MD;  Location: [facility];  Service: Neurosurgery;  Laterality: Left;   PULSE GENERATOR IMPLANT Left 04/24/2020   Procedure: REPLACEMENT LEFT FLANK PULSE GENERATOR IMPLANT;  Surgeon: Deetta Perla, MD;  Location: [facility];  Service: Neurosurgery;  Laterality: Left;  MAC w/ local   REPLACEMENT TOTAL KNEE Left    right arm fracture     RIGHT/LEFT HEART CATH AND CORONARY ANGIOGRAPHY N/A 08/28/2021   Procedure: RIGHT/LEFT HEART CATH AND CORONARY ANGIOGRAPHY;  Surgeon: Wellington Hampshire, MD;  Location: [facility];  Service: Cardiovascular;  Laterality: N/A;   SPINAL CORD STIMULATOR REMOVAL N/A 06/26/2020   Procedure: SPINAL CORD STIMULATOR REMOVAL;  Surgeon: Deetta Perla, MD;  Location: [facility];  Service: Neurosurgery;  Laterality: N/A;   THORACIC LAMINECTOMY FOR SPINAL CORD STIMULATOR N/A 01/24/2020   Procedure: THORACIC SPINAL CORD STIMULATOR PADDLE TRIAL VIA LAMINECTOMY;  Surgeon: Deetta Perla, MD;  Location: [facility];  Service: Neurosurgery;  Laterality: N/A;   TONSILLECTOMY AND ADENOIDECTOMY     TOTAL KNEE ARTHROPLASTY Left 04/17/2015    Procedure: LEFT TOTAL KNEE ARTHROPLASTY;  Surgeon: Paralee Cancel, MD;  Location: [facility];  Service: Orthopedics;  Laterality: Left;   Family History  Problem Relation Age of Onset   Heart disease Mother    Hypertension Mother    Diabetes Mother    Heart attack Mother 46   Heart disease Father    Heart attack Father 13   Breast cancer Maternal Aunt    Heart attack Brother    Social History   Tobacco Use   Smoking status: Never    Passive exposure: Yes   Smokeless tobacco: Never   Tobacco comments:    husbands and children smoked in home.   Substance Use Topics   Alcohol use: No   Allergies  Allergen Reactions   Vibegron Other (See Comments)    Not listed, received from facility paperwork. Pt denies   Prior  to Admission medications   Medication Sig Start Date End Date Taking? Authorizing Provider  acetaminophen (TYLENOL) 500 MG tablet Take 650 mg by mouth every 8 (eight) hours as needed for moderate pain.   Yes [provider]  alendronate (FOSAMAX) 70 MG tablet Take 70 mg by mouth once a week.  10/23/19  Yes [provider]  ALPRAZolam Duanne Moron) 0.5 MG tablet Take 1 tablet (0.5 mg total) by mouth 2 (two) times daily as needed for anxiety. 03/06/22  Yes Wieting, Richard, MD  aspirin EC 81 MG tablet Take 1 tablet (81 mg total) by mouth at bedtime. Swallow whole. 10/25/20  Yes McLean-Scocuzza, Nino Glow, MD  atorvastatin (LIPITOR) 20 MG tablet Take 1 tablet (20 mg total) by mouth daily. 06/20/20  Yes McLean-Scocuzza, Nino Glow, MD  calcium-vitamin D (OSCAL WITH D) 500-5 MG-MCG tablet Take 1 tablet by mouth 2 (two) times daily.   Yes [provider]  carboxymethylcellulose 1 % ophthalmic solution Apply 1 drop to eye at bedtime.   Yes [provider]  cetirizine (ZYRTEC) 5 MG tablet Take 5 mg by mouth daily.   Yes [provider]  Cholecalciferol (VITAMIN D3) 125 MCG (5000 UT) TABS Take 1 tablet (5,000 Units total) by mouth daily. 10/09/18  Yes  McLean-Scocuzza, Nino Glow, MD  citalopram (CELEXA) 20 MG tablet Take 20 mg by mouth daily. 01/10/22  Yes [provider]  Cranberry 450 MG TABS Take 1 tablet by mouth every morning. 01/03/22  Yes [provider]  Docusate Calcium (STOOL SOFTENER PO) Take by mouth. 2 capsules 2 times daily   Yes [provider]  HYDROcodone-acetaminophen (NORCO/VICODIN) 5-325 MG tablet Take 1 tablet by mouth every 6 (six) hours as needed for moderate pain. 05/13/22  Yes Hollice Espy, MD  hydrocortisone cream 1 % Apply 1 Application topically 2 (two) times daily as needed for itching.   Yes [provider]  levothyroxine (SYNTHROID) 100 MCG tablet Take 1 tablet (100 mcg total) by mouth daily before breakfast. 03/06/22  Yes Wieting, Richard, MD  losartan (COZAAR) 25 MG tablet Take 0.5 tablets (12.5 mg total) by mouth daily. 08/21/22 08/16/23 Yes End, Harrell Gave, MD  melatonin 5 MG TABS Take 1 tablet (5 mg total) by mouth at bedtime. 04/04/21  Yes Val Riles, MD  midodrine (PROAMATINE) 5 MG tablet Take 1 tablet (5 mg total) by mouth 3 (three) times daily with meals. 09/19/22  Yes End, Harrell Gave, MD  montelukast (SINGULAIR) 10 MG tablet Take 1 tablet (10 mg total) by mouth at bedtime. 02/14/20  Yes McLean-Scocuzza, Nino Glow, MD  nystatin powder Apply 1 Application topically every 6 (six) hours as needed (groin rash).   Yes [provider]  omeprazole (PRILOSEC) 40 MG capsule Take 1 capsule (40 mg total) by mouth daily. After lunch 06/23/20  Yes McLean-Scocuzza, Nino Glow, MD  ondansetron (ZOFRAN) 4 MG tablet Take 1 tablet (4 mg total) by mouth every 8 (eight) hours as needed for nausea or vomiting. 05/16/22  Yes [name], MD  oxybutynin (DITROPAN) 5 MG tablet Take 1 tablet (5 mg total) by mouth every 8 (eight) hours as needed for bladder spasms. 05/13/22  Yes Hollice Espy, MD  OXYGEN Inhale into the lungs. Uses for exertion   Yes [provider]  polyethylene glycol  (MIRALAX / GLYCOLAX) 17 g packet Take 17 g by mouth daily.   Yes [provider]  POTASSIUM CHLORIDE ER PO Take 20 mEq by mouth in the morning and at bedtime.  Yes [provider]  pregabalin (LYRICA) 150 MG capsule Take 150 mg by mouth at bedtime.   Yes [provider]  pregabalin (LYRICA) 75 MG capsule Take 75 mg by mouth every morning.   Yes [provider]  simethicone (MYLICON) 80 MG chewable tablet Chew 80 mg by mouth every 6 (six) hours as needed for flatulence.   Yes [provider]  torsemide (DEMADEX) 20 MG tablet Take 1 tablet (20 mg total) by mouth daily. 09/19/22  Yes End, Harrell Gave, MD  traZODone (DESYREL) 100 MG tablet Take 100 mg by mouth at bedtime. 12/13/21  Yes [provider]  venlafaxine (EFFEXOR) 75 MG tablet Take 75 mg by mouth 2 (two) times daily.   Yes [provider]  White Petrolatum-Mineral Oil (ARTIFICIAL TEARS) ointment at bedtime.   Yes [provider]   'Review of Systems  Constitutional:  Positive for appetite change (reduced) and fatigue.  HENT:  Negative for congestion, postnasal drip and sore throat.   Eyes: Negative.   Respiratory:  Positive for cough (dry) and shortness of breath. Negative for chest tightness.   Cardiovascular:  Positive for chest pain and leg swelling (left ankle).  Gastrointestinal:  Negative for abdominal distention.  Endocrine: Negative.   Genitourinary: Negative.   Musculoskeletal:  Positive for arthralgias (right shoulder/ left knee) and back pain (chronic due to 4 rods in back). Negative for neck pain.  Skin: Negative.   Allergic/Immunologic: Negative.   Neurological:  Positive for weakness (in legs), light-headedness and headaches (constant). Negative for dizziness.  Hematological:  Negative for adenopathy. Does not bruise/bleed easily.  Psychiatric/Behavioral:  Negative for dysphoric mood and sleep disturbance (sleeping on 1 pillow). The patient is  nervous/anxious.    Vitals:   10/17/22 1051  BP: (!) 141/85  Pulse: 88  SpO2: 98%  Weight: 194 lb (88 kg)   Wt Readings from Last 3 Encounters:  10/17/22 194 lb (88 kg)  10/09/22 196 lb (88.9 kg)  09/19/22 195 lb (88.5 kg)   Lab Results  Component Value Date   CREATININE 1.00 10/02/2022   CREATININE 0.86 09/19/2022   CREATININE 0.88 05/16/2022   Physical Exam Vitals and nursing note reviewed.  Constitutional:      Appearance: Kimberly Cook is well-developed.  HENT:     Head: Normocephalic and atraumatic.  Cardiovascular:     Rate and Rhythm: Normal rate and regular rhythm.  Pulmonary:     Effort: Pulmonary effort is normal. No respiratory distress.     Breath sounds: No wheezing or rales.  Abdominal:     General: There is no distension.     Palpations: Abdomen is soft.     Tenderness: There is no abdominal tenderness.  Musculoskeletal:     Cervical back: Normal range of motion and neck supple.     Right lower leg: No edema.     Left lower leg: No tenderness. Edema (trace pitting) present.  Skin:    General: Skin is warm and dry.  Neurological:     General: No focal deficit present.     Mental Status: Kimberly Cook is alert and oriented to person, place, and time.  Psychiatric:        Mood and Affect: Mood normal.        Behavior: Behavior normal.     Assessment & Plan:  1: Chronic heart failure with reduced ejection fraction- - NYHA class III - euvolemic today - being weighed daily; reminded to call for an overnight weight gain of >  2 pounds or a weekly weight gain of >5 pounds - weight up 3 pounds from last visit here 3 months ago - currently not adding salt except to grits; not eating much because the "food tastes terrible"; order written for dietician consult at SNF - losartan 12.'5mg'$  daily (BP could not tolerate entresto) - torsemide '20mg'$  daily; potassium 69mq  - has recurrent cystitis so not a good candidate for SGLT2 - has been bradycardic so may not be able to add  beta-blocker - has oxygen in her room but says that Kimberly Cook rarely wears it - wearing compression socks daily  - BNP 04/16/22 was 159.1  2: HTN- - BP 141/85 - currently seeing PCP at facility; order written for PCP to evaluate her heartburn - BMP 10/02/22 reviewed and showed sodium 136, potassium 3.6, creatinine 1.00 and GFR 58  3: CAD- - saw cardiology (End) 09/19/22 - aspirin '81mg'$   - atorvastatin '20mg'$  daily - EKG today: NSR   Facility medication list reviewed.   Will not make a return appointment for patient at this time. Emphasized continuing to f/u with cardiology group but that if Kimberly Cook needed another appointment, Kimberly Cook could certainly call back and Kimberly Cook was comfortable with this plan.

## 2022-10-21 DIAGNOSIS — M545 Low back pain, unspecified: Secondary | ICD-10-CM | POA: Diagnosis not present

## 2022-10-21 NOTE — Discharge Instructions (Signed)

## 2022-10-22 ENCOUNTER — Encounter: Payer: Self-pay | Admitting: *Deleted

## 2022-10-22 ENCOUNTER — Other Ambulatory Visit: Payer: Self-pay

## 2022-10-22 ENCOUNTER — Emergency Department: Payer: Medicare HMO

## 2022-10-22 ENCOUNTER — Inpatient Hospital Stay
Admission: EM | Admit: 2022-10-22 | Discharge: 2022-10-28 | DRG: 552 | Disposition: A | Discharge status: Skilled Nursing Facility | Payer: Medicare HMO | Source: Skilled Nursing Facility | Attending: Internal Medicine | Admitting: Internal Medicine

## 2022-10-22 DIAGNOSIS — I5022 Chronic systolic (congestive) heart failure: Secondary | ICD-10-CM | POA: Diagnosis not present

## 2022-10-22 DIAGNOSIS — M5136 Other intervertebral disc degeneration, lumbar region: Secondary | ICD-10-CM | POA: Diagnosis present

## 2022-10-22 DIAGNOSIS — Z79890 Hormone replacement therapy (postmenopausal): Secondary | ICD-10-CM

## 2022-10-22 DIAGNOSIS — S22068A Other fracture of T7-T8 thoracic vertebra, initial encounter for closed fracture: Principal | ICD-10-CM | POA: Diagnosis present

## 2022-10-22 DIAGNOSIS — M546 Pain in thoracic spine: Secondary | ICD-10-CM | POA: Diagnosis not present

## 2022-10-22 DIAGNOSIS — Z888 Allergy status to other drugs, medicaments and biological substances status: Secondary | ICD-10-CM

## 2022-10-22 DIAGNOSIS — S22060A Wedge compression fracture of T7-T8 vertebra, initial encounter for closed fracture: Secondary | ICD-10-CM | POA: Diagnosis not present

## 2022-10-22 DIAGNOSIS — G8929 Other chronic pain: Secondary | ICD-10-CM | POA: Diagnosis present

## 2022-10-22 DIAGNOSIS — R7303 Prediabetes: Secondary | ICD-10-CM | POA: Diagnosis present

## 2022-10-22 DIAGNOSIS — Z8673 Personal history of transient ischemic attack (TIA), and cerebral infarction without residual deficits: Secondary | ICD-10-CM

## 2022-10-22 DIAGNOSIS — N183 Chronic kidney disease, stage 3 unspecified: Secondary | ICD-10-CM | POA: Diagnosis present

## 2022-10-22 DIAGNOSIS — H2512 Age-related nuclear cataract, left eye: Secondary | ICD-10-CM | POA: Diagnosis not present

## 2022-10-22 DIAGNOSIS — Z79899 Other long term (current) drug therapy: Secondary | ICD-10-CM

## 2022-10-22 DIAGNOSIS — F32A Depression, unspecified: Secondary | ICD-10-CM | POA: Diagnosis present

## 2022-10-22 DIAGNOSIS — I959 Hypotension, unspecified: Secondary | ICD-10-CM | POA: Diagnosis not present

## 2022-10-22 DIAGNOSIS — W19XXXA Unspecified fall, initial encounter: Secondary | ICD-10-CM | POA: Diagnosis present

## 2022-10-22 DIAGNOSIS — E039 Hypothyroidism, unspecified: Secondary | ICD-10-CM | POA: Diagnosis present

## 2022-10-22 DIAGNOSIS — I130 Hypertensive heart and chronic kidney disease with heart failure and stage 1 through stage 4 chronic kidney disease, or unspecified chronic kidney disease: Secondary | ICD-10-CM | POA: Diagnosis not present

## 2022-10-22 DIAGNOSIS — R55 Syncope and collapse: Secondary | ICD-10-CM | POA: Diagnosis present

## 2022-10-22 DIAGNOSIS — I428 Other cardiomyopathies: Secondary | ICD-10-CM | POA: Diagnosis not present

## 2022-10-22 DIAGNOSIS — Z8619 Personal history of other infectious and parasitic diseases: Secondary | ICD-10-CM | POA: Diagnosis not present

## 2022-10-22 DIAGNOSIS — S199XXA Unspecified injury of neck, initial encounter: Secondary | ICD-10-CM | POA: Diagnosis not present

## 2022-10-22 DIAGNOSIS — S3992XA Unspecified injury of lower back, initial encounter: Secondary | ICD-10-CM | POA: Diagnosis not present

## 2022-10-22 DIAGNOSIS — W182XXA Fall in (into) shower or empty bathtub, initial encounter: Secondary | ICD-10-CM | POA: Diagnosis present

## 2022-10-22 DIAGNOSIS — N1831 Chronic kidney disease, stage 3a: Secondary | ICD-10-CM | POA: Diagnosis not present

## 2022-10-22 DIAGNOSIS — M40204 Unspecified kyphosis, thoracic region: Secondary | ICD-10-CM | POA: Diagnosis not present

## 2022-10-22 DIAGNOSIS — S22069A Unspecified fracture of T7-T8 vertebra, initial encounter for closed fracture: Secondary | ICD-10-CM

## 2022-10-22 DIAGNOSIS — Z8249 Family history of ischemic heart disease and other diseases of the circulatory system: Secondary | ICD-10-CM

## 2022-10-22 DIAGNOSIS — E876 Hypokalemia: Secondary | ICD-10-CM | POA: Diagnosis present

## 2022-10-22 DIAGNOSIS — I272 Pulmonary hypertension, unspecified: Secondary | ICD-10-CM | POA: Diagnosis present

## 2022-10-22 DIAGNOSIS — Z96652 Presence of left artificial knee joint: Secondary | ICD-10-CM | POA: Diagnosis present

## 2022-10-22 DIAGNOSIS — K5900 Constipation, unspecified: Secondary | ICD-10-CM | POA: Diagnosis present

## 2022-10-22 DIAGNOSIS — Y93E1 Activity, personal bathing and showering: Secondary | ICD-10-CM

## 2022-10-22 DIAGNOSIS — E539 Vitamin B deficiency, unspecified: Secondary | ICD-10-CM | POA: Diagnosis present

## 2022-10-22 DIAGNOSIS — G629 Polyneuropathy, unspecified: Secondary | ICD-10-CM | POA: Diagnosis present

## 2022-10-22 DIAGNOSIS — Y92121 Bathroom in nursing home as the place of occurrence of the external cause: Secondary | ICD-10-CM

## 2022-10-22 DIAGNOSIS — M549 Dorsalgia, unspecified: Secondary | ICD-10-CM | POA: Diagnosis present

## 2022-10-22 DIAGNOSIS — Z6833 Body mass index (BMI) 33.0-33.9, adult: Secondary | ICD-10-CM | POA: Diagnosis not present

## 2022-10-22 DIAGNOSIS — Z7982 Long term (current) use of aspirin: Secondary | ICD-10-CM

## 2022-10-22 DIAGNOSIS — S22068D Other fracture of T7-T8 thoracic vertebra, subsequent encounter for fracture with routine healing: Secondary | ICD-10-CM | POA: Diagnosis not present

## 2022-10-22 DIAGNOSIS — I2510 Atherosclerotic heart disease of native coronary artery without angina pectoris: Secondary | ICD-10-CM | POA: Diagnosis present

## 2022-10-22 DIAGNOSIS — Z803 Family history of malignant neoplasm of breast: Secondary | ICD-10-CM

## 2022-10-22 DIAGNOSIS — E559 Vitamin D deficiency, unspecified: Secondary | ICD-10-CM | POA: Diagnosis present

## 2022-10-22 DIAGNOSIS — K219 Gastro-esophageal reflux disease without esophagitis: Secondary | ICD-10-CM | POA: Diagnosis present

## 2022-10-22 DIAGNOSIS — Z7401 Bed confinement status: Secondary | ICD-10-CM | POA: Diagnosis not present

## 2022-10-22 DIAGNOSIS — D696 Thrombocytopenia, unspecified: Secondary | ICD-10-CM | POA: Diagnosis present

## 2022-10-22 DIAGNOSIS — M4126 Other idiopathic scoliosis, lumbar region: Secondary | ICD-10-CM | POA: Diagnosis not present

## 2022-10-22 DIAGNOSIS — E785 Hyperlipidemia, unspecified: Secondary | ICD-10-CM | POA: Diagnosis present

## 2022-10-22 DIAGNOSIS — Z66 Do not resuscitate: Secondary | ICD-10-CM | POA: Diagnosis present

## 2022-10-22 DIAGNOSIS — Z833 Family history of diabetes mellitus: Secondary | ICD-10-CM | POA: Diagnosis not present

## 2022-10-22 DIAGNOSIS — Z87440 Personal history of urinary (tract) infections: Secondary | ICD-10-CM

## 2022-10-22 DIAGNOSIS — I5020 Unspecified systolic (congestive) heart failure: Secondary | ICD-10-CM | POA: Diagnosis present

## 2022-10-22 DIAGNOSIS — R0902 Hypoxemia: Secondary | ICD-10-CM | POA: Diagnosis not present

## 2022-10-22 DIAGNOSIS — Z981 Arthrodesis status: Secondary | ICD-10-CM

## 2022-10-22 DIAGNOSIS — S299XXA Unspecified injury of thorax, initial encounter: Secondary | ICD-10-CM | POA: Diagnosis not present

## 2022-10-22 DIAGNOSIS — M542 Cervicalgia: Secondary | ICD-10-CM | POA: Diagnosis present

## 2022-10-22 DIAGNOSIS — F419 Anxiety disorder, unspecified: Secondary | ICD-10-CM | POA: Diagnosis present

## 2022-10-22 DIAGNOSIS — S0990XA Unspecified injury of head, initial encounter: Secondary | ICD-10-CM | POA: Diagnosis not present

## 2022-10-22 DIAGNOSIS — E669 Obesity, unspecified: Secondary | ICD-10-CM | POA: Diagnosis present

## 2022-10-22 DIAGNOSIS — M545 Low back pain, unspecified: Secondary | ICD-10-CM | POA: Diagnosis not present

## 2022-10-22 HISTORY — DX: Unspecified fracture of T7-t8 vertebra, initial encounter for closed fracture: S22.069A

## 2022-10-22 MED ORDER — MORPHINE SULFATE (PF) 4 MG/ML IV SOLN
4.0000 mg | Freq: Once | INTRAVENOUS | Status: AC
  Administered 2022-10-22: 4 mg via INTRAVENOUS
  Filled 2022-10-22: qty 1

## 2022-10-22 MED ORDER — OXYCODONE-ACETAMINOPHEN 5-325 MG PO TABS
1.0000 | ORAL_TABLET | Freq: Once | ORAL | Status: AC
  Administered 2022-10-22: 1 via ORAL
  Filled 2022-10-22: qty 1

## 2022-10-22 NOTE — ED Notes (Signed)
No xrays or scans at this time per dr Cherylann Banas

## 2022-10-22 NOTE — ED Triage Notes (Addendum)
Pt brought in via [facility] from [facility].  Pt in recliner in triage.  Pt fell 3 days ago getting out of the shower.  Pt struck head and back.   Pt has neck and back pain.   No loc  no blood thinners.  Pt reports rods in her back.    Pt alert  speech clear.

## 2022-10-22 NOTE — H&P (Incomplete)
History and Physical    Chief Complaint: ***   HISTORY OF PRESENT ILLNESS: Kimberly Cook is an 77 y.o. female  ***      Pt has  Past Medical History:  Diagnosis Date  . Acute respiratory failure, unsp w hypoxia or hypercapnia (HCC)   . Allergy   . Anxiety    a.) on BZO (alprazolam) PRN  . Aortic atherosclerosis ([place])   . Arthritis   . Back pain   . Chronic HFrEF (heart failure with reduced ejection fraction) ([place])    a. 08/2021 echo: EF 30-35%, glob HK, GrI DD; b. 10/2021 Echo: EF 30-35%.  . CKD (chronic kidney disease), stage II   . Complication of anesthesia    Slow to clear meds after "14 hour back surgery"  . Coronary artery disease    a. Mild to moderate CAD in LAD/diagonal by CTA (CT-FFR of apical LAD 0.79); b. 08/2021 Cath: LM nl, LAD min irregs, D1/2/3 nl, LCX nl, OM2/3 nl, RCA 20p, RPDA mild dzs, RPAV nl.  . DDD (degenerative disc disease), lumbar   . Depression   . Diverticulosis   . Essential hypertension   . GERD (gastroesophageal reflux disease)   . Headache   . Hiatal hernia   . History of shingles   . HLD (hyperlipidemia)   . Hypothyroidism   . Lung nodule   . Mini stroke 2011  . Moderate Pericardial effusion    a. 08/2021 Echo: moderate circumferential pericardial effusion w/o tamponade; b. 10/2021 Echo: EF 30-35%, small to mod circumferential pericardial effusion w/o tamponade.  . Muscle weakness   . NICM (nonischemic cardiomyopathy) ([place])    a. 08/2021 Echo: EF 30-35%, glob HK, GrI DD, nl RV fxn, mild-mod dil LA, mod circumferential pericardial eff w/o tamponade, Mod MR; b. 10/2021 Echo: EF 30-35%, glob HK.  . Occipital neuralgia   . On supplemental oxygen by nasal cannula    a.) 2L.[place] at bedtime and PRN  . Palpitations   . Pancytopenia ([place])   . Pleural effusion on left    a. 08/2021 s/p thoracentesis.  . Pneumonia 2018  . Polyneuropathy   . Prediabetes   . Renal cyst, left    a.) Renal artery [place] 12/26/2017: measured 2.1 cm.  . Sleep  difficulties    a.) takes trazodone + melatonin  . Stroke [facility])    MRI 04/2008 + left sup. frontal gyrus possibly puntate infarct   . Syncope 2019  . Uses wheelchair    able to stand and self transfer  . Vitamin B deficiency   . Vitamin D deficiency      Review of Systems Allergies  Allergen Reactions  . Vibegron Other (See Comments)    Not listed, received from facility paperwork. Pt denies   Past Surgical History:  Procedure Laterality Date  . ABDOMINAL HYSTERECTOMY    . BLADDER SURGERY     2003  . BREAST EXCISIONAL BIOPSY Right Over 20 years    Benign  . CATARACT EXTRACTION W/PHACO Right 10/09/2022   Procedure: CATARACT EXTRACTION PHACO AND INTRAOCULAR LENS PLACEMENT ([place]) RIGHT  8.13  00:43.5;  Surgeon: Leandrew Koyanagi, MD;  Location: [place];  Service: Ophthalmology;  Laterality: Right;  . CHOLECYSTECTOMY    . CYSTOSCOPY W/ URETERAL STENT PLACEMENT Left 05/13/2022   Procedure: CYSTOSCOPY WITH RETROGRADE PYELOGRAM/URETERAL LEFT STENT PLACEMENT;  Surgeon: Hollice Espy, MD;  Location: [facility];  Service: Urology;  Laterality: Left;  . CYSTOSCOPY WITH BIOPSY N/A 05/13/2022   Procedure: CYSTOSCOPY  WITH BLADDER BIOPSY;  Surgeon: Hollice Espy, MD;  Location: [facility];  Service: Urology;  Laterality: N/A;  . gastroplication     . KNEE ARTHROSCOPY Left 2011  . PULSE GENERATOR IMPLANT Left 01/31/2020   Procedure: PLACEMENT RIGHT FLANK PULSE GENERATOR VS REMOVAL SPINAL CORD STIMULATOR;  Surgeon: Deetta Perla, MD;  Location: [facility];  Service: Neurosurgery;  Laterality: Left;  . PULSE GENERATOR IMPLANT Left 04/24/2020   Procedure: REPLACEMENT LEFT FLANK PULSE GENERATOR IMPLANT;  Surgeon: Deetta Perla, MD;  Location: [facility];  Service: Neurosurgery;  Laterality: Left;  MAC w/ local  . REPLACEMENT TOTAL KNEE Left   . right arm fracture    . RIGHT/LEFT HEART CATH AND CORONARY ANGIOGRAPHY N/A 08/28/2021   Procedure: RIGHT/LEFT HEART CATH AND CORONARY ANGIOGRAPHY;   Surgeon: Wellington Hampshire, MD;  Location: [place] [facility];  Service: Cardiovascular;  Laterality: N/A;  . SPINAL CORD STIMULATOR REMOVAL N/A 06/26/2020   Procedure: SPINAL CORD STIMULATOR REMOVAL;  Surgeon: Deetta Perla, MD;  Location: [facility];  Service: Neurosurgery;  Laterality: N/A;  . THORACIC LAMINECTOMY FOR SPINAL CORD STIMULATOR N/A 01/24/2020   Procedure: THORACIC SPINAL CORD STIMULATOR PADDLE TRIAL VIA LAMINECTOMY;  Surgeon: Deetta Perla, MD;  Location: [facility];  Service: Neurosurgery;  Laterality: N/A;  . TONSILLECTOMY AND ADENOIDECTOMY    . TOTAL KNEE ARTHROPLASTY Left 04/17/2015   Procedure: LEFT TOTAL KNEE ARTHROPLASTY;  Surgeon: Paralee Cancel, MD;  Location: [facility];  Service: Orthopedics;  Laterality: Left;       MEDICATIONS: Current Outpatient Medications  Medication Instructions  . acetaminophen (TYLENOL) 650 mg, Oral, Every 8 hours PRN  . alendronate (FOSAMAX) 70 mg, Oral, Weekly  . ALPRAZolam (XANAX) 0.5 mg, Oral, 2 times daily PRN  . aspirin EC 81 mg, Oral, Daily at bedtime, Swallow whole.   Marland Kitchen atorvastatin (LIPITOR) 20 mg, Oral, Daily  . calcium-vitamin D (OSCAL WITH D) 500-5 MG-MCG tablet 1 tablet, Oral, 2 times daily  . carboxymethylcellulose 1 % ophthalmic solution 1 drop, Ophthalmic, Daily at bedtime  . cetirizine (ZYRTEC) 5 mg, Oral, Daily  . citalopram (CELEXA) 20 mg, Oral, Daily  . Cranberry 450 MG TABS 1 tablet, Oral, Every morning  . Docusate Calcium (STOOL SOFTENER PO) Oral, 2 capsules 2 times daily  . HYDROcodone-acetaminophen (NORCO/VICODIN) 5-325 MG tablet 1 tablet, Oral, Every 6 hours PRN  . hydrocortisone cream 1 % 1 Application, Topical, 2 times daily PRN  . levothyroxine (SYNTHROID) 100 mcg, Oral, Daily before breakfast  . losartan (COZAAR) 12.5 mg, Oral, Daily  . melatonin 5 mg, Oral, Daily at bedtime  . midodrine (PROAMATINE) 5 mg, Oral, 3 times daily with meals  . montelukast (SINGULAIR) 10 mg, Oral, Daily at bedtime  . nystatin powder 1  Application, Topical, Every 6 hours PRN  . omeprazole (PRILOSEC) 40 mg, Oral, Daily, After lunch  . ondansetron (ZOFRAN) 4 mg, Oral, Every 8 hours PRN  . oxybutynin (DITROPAN) 5 mg, Oral, Every 8 hours PRN  . OXYGEN Inhalation, Uses for exertion   . polyethylene glycol (MIRALAX / GLYCOLAX) 17 g, Oral, Daily  . POTASSIUM CHLORIDE ER PO 20 mEq, Oral, 2 times daily  . pregabalin (LYRICA) 75 mg, Oral, BH-each morning  . pregabalin (LYRICA) 150 mg, Oral, Daily at bedtime  . simethicone (MYLICON) 80 mg, Oral, Every 6 hours PRN  . torsemide (DEMADEX) 20 mg, Oral, Daily  . traZODone (DESYREL) 100 mg, Oral, Daily at bedtime  . venlafaxine (EFFEXOR) 75 mg, Oral, 2 times daily  . Vitamin D3  5,000 Units, Oral, Daily  . White Petrolatum-Mineral Oil (ARTIFICIAL TEARS) ointment Daily at bedtime    .  morphine injection  4 mg Intravenous Once          ED Course: Pt in Ed *** Vitals:   10/22/22 1654 10/22/22 1659 10/22/22 2030  BP:  (!) 109/57 122/88  Pulse:  77 77  Resp:  20 18  Temp:  98.4 F (36.9 C) 98.4 F (36.9 C)  TempSrc:  Oral   SpO2:  98% 100%  Weight: 86.6 kg    Height: 5\' 4"  (1.626 m)      No intake/output data recorded. SpO2: 100 % Blood work in ed shows: No results found for this or any previous visit (from the past 24 hour(s)).  Unresulted Labs (From admission, onward)     Start     Ordered   10/22/22 [id number]  Basic metabolic panel  Once,   STAT        10/22/22 2318   10/22/22 2319  CBC with Differential  Once,   STAT        10/22/22 2318           Pt has received : Orders Placed This Encounter  Procedures  . CT Head Wo Contrast    Standing Status:   Standing    Number of Occurrences:   1  . CT Cervical Spine Wo Contrast    Standing Status:   Standing    Number of Occurrences:   1  . CT Lumbar Spine Wo Contrast    Standing Status:   Standing    Number of Occurrences:   1  . CT Thoracic Spine Wo Contrast    Standing Status:   Standing    Number of  Occurrences:   1  . Basic metabolic panel    Standing Status:   Standing    Number of Occurrences:   1  . CBC with Differential    Standing Status:   Standing    Number of Occurrences:   1  . Consult to hospitalist  [phone]    Standing Status:   Standing    Number of Occurrences:   1    Order Specific Question:   Place call to:    Answer:   Hospitalist    Order Specific Question:   Reason for Consult    Answer:   Admit    Order Specific Question:   Diagnosis/Clinical Info for Consult:    Answer:   T7 fracture, pain control    Meds ordered this encounter  Medications  . oxyCODONE-acetaminophen (PERCOCET/ROXICET) 5-325 MG per tablet 1 tablet  . morphine (PF) 4 MG/ML injection 4 mg     Admission Imaging : CT Lumbar Spine Wo Contrast  Result Date: 10/22/2022 CLINICAL DATA:  Back trauma, no prior imaging (Age >= 16y).  Fall EXAM: CT THORACIC AND LUMBAR SPINE WITHOUT CONTRAST TECHNIQUE: Multidetector CT imaging of the thoracic and lumbar spine was performed without contrast. Multiplanar CT image reconstructions were also generated. RADIATION DOSE REDUCTION: This exam was performed according to the departmental dose-optimization program which includes automated exposure control, adjustment of the mA and/or kV according to patient size and/or use of iterative reconstruction technique. COMPARISON:  11/03/2019 FINDINGS: CT THORACIC SPINE FINDINGS Alignment: Thoracolumbar scoliosis, apex left at T12 appears stable. Interval T4-bilateral iliac spinal fusion with bilateral pedicle screws at T4-L1 with posterior bars. Accentuated thoracic kyphosis. No listhesis. Vertebrae: Osseous structures are diffusely osteopenic. There  is a minimally displaced axially oriented acute to subacute fracture through the T7 vertebral body just superior to the inferior endplate which extends through the anterior and posterior walls of the vertebral body without significant loss of height. There is gas along the  fracture plane noted. The posterior elements appear intact. No other fracture identified. Remaining vertebral body height is preserved. No significant periprosthetic lucency. No hardware fracture. Paraspinal and other soft tissues: There is mild paravertebral infiltration surrounding the T7 vertebral body in keeping with a small amount of surrounding interstitial hemorrhage or edema. No canal hematoma. The paraspinal soft tissues are otherwise unremarkable. Incidentally noted is moderate coronary artery calcification. Surgical changes of gastric bypass and cholecystectomy noted. Disc levels: There is intervertebral disc space narrowing and endplate remodeling throughout thoracic spine in keeping with changes of diffuse degenerative disc disease. No high-grade canal stenosis or neuroforaminal narrowing. CT LUMBAR SPINE FINDINGS Segmentation: 5 lumbar type vertebrae. Alignment: Interval thoracic-bi iliac fusion with bilateral pedicle screws, posterior bars, interbody bone cages, and bilateral iliac screws identified. Normal lumbar lordosis. Stable mild lumbar levoscoliosis, apex left at L3. No listhesis. Vertebrae: Osseous structures are diffusely osteopenic. No acute fracture. Vertebral body height is preserved. No significant periprosthetic lucency or hardware fracture to suggest hardware failure. No definite incorporation of interbody bone graft at L5-S1. Paraspinal and other soft tissues: Negative. Disc levels: Facet hypertrophy in combination with disc height loss contributes to mild to moderate right neuroforaminal narrowing at L1-L5 and left neuroforaminal narrowing at L4-5, most severe on the right at L1-2. No high-grade canal stenosis. IMPRESSION: 1. Interval T4-bilateral iliac spinal fusion with instrumentation as described above. No significant periprosthetic lucency or hardware fracture to suggest hardware failure. No definite incorporation of interbody bone graft at L5-S1. 2. Minimally displaced axially  oriented acute to subacute fracture through the T7 vertebral body without significant loss of height or associated listhesis. Posterior elements appear intact. No canal hematoma. Mild surrounding paravertebral edema. 3. No acute fracture or listhesis of the lumbar spine. 4. Multilevel degenerative disc and degenerative joint disease resulting in mild to moderate right neuroforaminal narrowing at L1-L5 and left neuroforaminal narrowing at L4-5, most severe on the right at L1-2. Electronically Signed   By: Fidela Salisbury M.D.   On: 10/22/2022 22:46   CT Thoracic Spine Wo Contrast  Result Date: 10/22/2022 CLINICAL DATA:  Back trauma, no prior imaging (Age >= 16y).  Fall EXAM: CT THORACIC AND LUMBAR SPINE WITHOUT CONTRAST TECHNIQUE: Multidetector CT imaging of the thoracic and lumbar spine was performed without contrast. Multiplanar CT image reconstructions were also generated. RADIATION DOSE REDUCTION: This exam was performed according to the departmental dose-optimization program which includes automated exposure control, adjustment of the mA and/or kV according to patient size and/or use of iterative reconstruction technique. COMPARISON:  11/03/2019 FINDINGS: CT THORACIC SPINE FINDINGS Alignment: Thoracolumbar scoliosis, apex left at T12 appears stable. Interval T4-bilateral iliac spinal fusion with bilateral pedicle screws at T4-L1 with posterior bars. Accentuated thoracic kyphosis. No listhesis. Vertebrae: Osseous structures are diffusely osteopenic. There is a minimally displaced axially oriented acute to subacute fracture through the T7 vertebral body just superior to the inferior endplate which extends through the anterior and posterior walls of the vertebral body without significant loss of height. There is gas along the fracture plane noted. The posterior elements appear intact. No other fracture identified. Remaining vertebral body height is preserved. No significant periprosthetic lucency. No hardware  fracture. Paraspinal and other soft tissues: There is mild paravertebral infiltration surrounding  the T7 vertebral body in keeping with a small amount of surrounding interstitial hemorrhage or edema. No canal hematoma. The paraspinal soft tissues are otherwise unremarkable. Incidentally noted is moderate coronary artery calcification. Surgical changes of gastric bypass and cholecystectomy noted. Disc levels: There is intervertebral disc space narrowing and endplate remodeling throughout thoracic spine in keeping with changes of diffuse degenerative disc disease. No high-grade canal stenosis or neuroforaminal narrowing. CT LUMBAR SPINE FINDINGS Segmentation: 5 lumbar type vertebrae. Alignment: Interval thoracic-bi iliac fusion with bilateral pedicle screws, posterior bars, interbody bone cages, and bilateral iliac screws identified. Normal lumbar lordosis. Stable mild lumbar levoscoliosis, apex left at L3. No listhesis. Vertebrae: Osseous structures are diffusely osteopenic. No acute fracture. Vertebral body height is preserved. No significant periprosthetic lucency or hardware fracture to suggest hardware failure. No definite incorporation of interbody bone graft at L5-S1. Paraspinal and other soft tissues: Negative. Disc levels: Facet hypertrophy in combination with disc height loss contributes to mild to moderate right neuroforaminal narrowing at L1-L5 and left neuroforaminal narrowing at L4-5, most severe on the right at L1-2. No high-grade canal stenosis. IMPRESSION: 1. Interval T4-bilateral iliac spinal fusion with instrumentation as described above. No significant periprosthetic lucency or hardware fracture to suggest hardware failure. No definite incorporation of interbody bone graft at L5-S1. 2. Minimally displaced axially oriented acute to subacute fracture through the T7 vertebral body without significant loss of height or associated listhesis. Posterior elements appear intact. No canal hematoma. Mild  surrounding paravertebral edema. 3. No acute fracture or listhesis of the lumbar spine. 4. Multilevel degenerative disc and degenerative joint disease resulting in mild to moderate right neuroforaminal narrowing at L1-L5 and left neuroforaminal narrowing at L4-5, most severe on the right at L1-2. Electronically Signed   By: Fidela Salisbury M.D.   On: 10/22/2022 22:46   CT Head Wo Contrast  Result Date: 10/22/2022 CLINICAL DATA:  Trauma. EXAM: CT HEAD WITHOUT CONTRAST CT CERVICAL SPINE WITHOUT CONTRAST TECHNIQUE: Multidetector CT imaging of the head and cervical spine was performed following the standard protocol without intravenous contrast. Multiplanar CT image reconstructions of the cervical spine were also generated. RADIATION DOSE REDUCTION: This exam was performed according to the departmental dose-optimization program which includes automated exposure control, adjustment of the mA and/or kV according to patient size and/or use of iterative reconstruction technique. COMPARISON:  None Available. FINDINGS: CT HEAD FINDINGS Brain: Mild age-related atrophy and chronic microvascular ischemic changes. There is no acute intracranial hemorrhage. No mass effect or midline shift. No extra-axial fluid collection. Vascular: No hyperdense vessel or unexpected calcification. Skull: Normal. Negative for fracture or focal lesion. Sinuses/Orbits: No acute finding. Other: None CT CERVICAL SPINE FINDINGS Alignment: No acute subluxation. Skull base and vertebrae: No acute fracture.  Osteopenia. Soft tissues and spinal canal: No prevertebral fluid or swelling. No visible canal hematoma. Disc levels:  No acute findings.  Degenerative changes. Upper chest: Negative. Other: None IMPRESSION: 1. No acute intracranial pathology. Mild age-related atrophy and chronic microvascular ischemic changes. 2. No acute/traumatic cervical spine pathology. Electronically Signed   By: Anner Crete M.D.   On: 10/22/2022 22:30   CT Cervical  Spine Wo Contrast  Result Date: 10/22/2022 CLINICAL DATA:  Trauma. EXAM: CT HEAD WITHOUT CONTRAST CT CERVICAL SPINE WITHOUT CONTRAST TECHNIQUE: Multidetector CT imaging of the head and cervical spine was performed following the standard protocol without intravenous contrast. Multiplanar CT image reconstructions of the cervical spine were also generated. RADIATION DOSE REDUCTION: This exam was performed according to the departmental dose-optimization program  which includes automated exposure control, adjustment of the mA and/or kV according to patient size and/or use of iterative reconstruction technique. COMPARISON:  None Available. FINDINGS: CT HEAD FINDINGS Brain: Mild age-related atrophy and chronic microvascular ischemic changes. There is no acute intracranial hemorrhage. No mass effect or midline shift. No extra-axial fluid collection. Vascular: No hyperdense vessel or unexpected calcification. Skull: Normal. Negative for fracture or focal lesion. Sinuses/Orbits: No acute finding. Other: None CT CERVICAL SPINE FINDINGS Alignment: No acute subluxation. Skull base and vertebrae: No acute fracture.  Osteopenia. Soft tissues and spinal canal: No prevertebral fluid or swelling. No visible canal hematoma. Disc levels:  No acute findings.  Degenerative changes. Upper chest: Negative. Other: None IMPRESSION: 1. No acute intracranial pathology. Mild age-related atrophy and chronic microvascular ischemic changes. 2. No acute/traumatic cervical spine pathology. Electronically Signed   By: Anner Crete M.D.   On: 10/22/2022 22:30      Physical Examination: Vitals:   10/22/22 1654 10/22/22 1659 10/22/22 2030  BP:  (!) 109/57 122/88  Pulse:  77 77  Temp:  98.4 F (36.9 C) 98.4 F (36.9 C)  Resp:  20 18  Height: 5\' 4"  (1.626 m)    Weight: 86.6 kg    SpO2:  98% 100%  TempSrc:  Oral   BMI (Calculated): 32.77     Physical Exam    Assessment and Plan: No notes have been filed under this hospital  service. Service: Hospitalist          DVT prophylaxis:  ***  Code Status:  ***  Family Communication:  ***  Disposition Plan:  ***  Consults called:  Dr. Cari Caraway.  Admission status: ***  Unit/ Expected LOS: ***   Para Skeans MD [facility]  6 PM- 2 AM. Please contact me via secure Chat 6 PM-2 AM. [phone]( Pager ) To contact the [facility] Attending or Consulting provider [place] or covering provider during after hours [place], for this patient.   Check the care team in [facility] and look for a) attending/consulting [facility] provider listed and b) the [facility] team listed Log into [email or web address] and use 's universal password to access. If you do not have the password, please contact the hospital operator. Locate the [facility] provider you are looking for under [facility] and page to a number that you can be directly reached. If you still have difficulty reaching the provider, please page the [facility] (Director on Call) for the Hospitalists listed on amion for assistance. [email or web address] 10/22/2022, 11:27 PM

## 2022-10-23 ENCOUNTER — Observation Stay: Payer: Medicare HMO

## 2022-10-23 DIAGNOSIS — I959 Hypotension, unspecified: Secondary | ICD-10-CM

## 2022-10-23 DIAGNOSIS — S22060A Wedge compression fracture of T7-T8 vertebra, initial encounter for closed fracture: Secondary | ICD-10-CM | POA: Diagnosis not present

## 2022-10-23 DIAGNOSIS — S22068A Other fracture of T7-T8 thoracic vertebra, initial encounter for closed fracture: Secondary | ICD-10-CM | POA: Diagnosis not present

## 2022-10-23 DIAGNOSIS — S22069A Unspecified fracture of T7-T8 vertebra, initial encounter for closed fracture: Secondary | ICD-10-CM | POA: Diagnosis present

## 2022-10-23 DIAGNOSIS — H2512 Age-related nuclear cataract, left eye: Secondary | ICD-10-CM | POA: Diagnosis not present

## 2022-10-23 DIAGNOSIS — W19XXXA Unspecified fall, initial encounter: Secondary | ICD-10-CM | POA: Diagnosis not present

## 2022-10-23 LAB — COMPREHENSIVE METABOLIC PANEL
ALT: 15 U/L (ref 0–44)
AST: 21 U/L (ref 15–41)
Albumin: 3.5 g/dL (ref 3.5–5.0)
Alkaline Phosphatase: 58 U/L (ref 38–126)
Anion gap: 9 (ref 5–15)
BUN: 12 mg/dL (ref 8–23)
CO2: 24 mmol/L (ref 22–32)
Calcium: 8.6 mg/dL — ABNORMAL LOW (ref 8.9–10.3)
Chloride: 102 mmol/L (ref 98–111)
Creatinine, Ser: 1.07 mg/dL — ABNORMAL HIGH (ref 0.44–1.00)
GFR, Estimated: 53 mL/min — ABNORMAL LOW (ref >60)
Glucose, Bld: 111 mg/dL — ABNORMAL HIGH (ref 70–99)
Potassium: 3.5 mmol/L (ref 3.5–5.1)
Sodium: 135 mmol/L (ref 135–145)
Total Bilirubin: 0.7 mg/dL (ref 0.3–1.2)
Total Protein: 6.8 g/dL (ref 6.5–8.1)

## 2022-10-23 LAB — CK: Total CK: 37 U/L — ABNORMAL LOW (ref 38–234)

## 2022-10-23 LAB — CBC WITH DIFFERENTIAL/PLATELET
Abs Immature Granulocytes: 0.02 10*3/uL (ref 0.00–0.07)
Basophils Absolute: 0 10*3/uL (ref 0.0–0.1)
Basophils Relative: 0 %
Eosinophils Absolute: 0 10*3/uL (ref 0.0–0.5)
Eosinophils Relative: 0 %
HCT: 37 % (ref 36.0–46.0)
Hemoglobin: 13 g/dL (ref 12.0–15.0)
Immature Granulocytes: 0 %
Lymphocytes Relative: 18 %
Lymphs Abs: 0.9 10*3/uL (ref 0.7–4.0)
MCH: 31.4 pg (ref 26.0–34.0)
MCHC: 35.1 g/dL (ref 30.0–36.0)
MCV: 89.4 fL (ref 80.0–100.0)
Monocytes Absolute: 0.4 10*3/uL (ref 0.1–1.0)
Monocytes Relative: 9 %
Neutro Abs: 3.5 10*3/uL (ref 1.7–7.7)
Neutrophils Relative %: 73 %
Platelets: 135 10*3/uL — ABNORMAL LOW (ref 150–400)
RBC: 4.14 MIL/uL (ref 3.87–5.11)
RDW: 12.7 % (ref 11.5–15.5)
WBC: 4.9 10*3/uL (ref 4.0–10.5)
nRBC: 0 % (ref 0.0–0.2)

## 2022-10-23 LAB — BASIC METABOLIC PANEL
Anion gap: 12 (ref 5–15)
BUN: 10 mg/dL (ref 8–23)
CO2: 23 mmol/L (ref 22–32)
Calcium: 8.8 mg/dL — ABNORMAL LOW (ref 8.9–10.3)
Chloride: 99 mmol/L (ref 98–111)
Creatinine, Ser: 1.06 mg/dL — ABNORMAL HIGH (ref 0.44–1.00)
GFR, Estimated: 54 mL/min — ABNORMAL LOW (ref >60)
Glucose, Bld: 106 mg/dL — ABNORMAL HIGH (ref 70–99)
Potassium: 3.1 mmol/L — ABNORMAL LOW (ref 3.5–5.1)
Sodium: 134 mmol/L — ABNORMAL LOW (ref 135–145)

## 2022-10-23 LAB — CBC
HCT: 35.9 % — ABNORMAL LOW (ref 36.0–46.0)
Hemoglobin: 12.4 g/dL (ref 12.0–15.0)
MCH: 31.2 pg (ref 26.0–34.0)
MCHC: 34.5 g/dL (ref 30.0–36.0)
MCV: 90.2 fL (ref 80.0–100.0)
Platelets: 131 10*3/uL — ABNORMAL LOW (ref 150–400)
RBC: 3.98 MIL/uL (ref 3.87–5.11)
RDW: 12.6 % (ref 11.5–15.5)
WBC: 5.3 10*3/uL (ref 4.0–10.5)
nRBC: 0 % (ref 0.0–0.2)

## 2022-10-23 LAB — TYPE AND SCREEN
ABO/RH(D): AB POS
Antibody Screen: NEGATIVE

## 2022-10-23 MED ORDER — ACETAMINOPHEN 650 MG RE SUPP: 650.0000 mg | Freq: Four times a day (QID) | RECTAL | Status: DC | PRN

## 2022-10-23 MED ORDER — LEVOTHYROXINE SODIUM 100 MCG PO TABS
100.0000 ug | ORAL_TABLET | Freq: Every day | ORAL | Status: DC
  Administered 2022-10-23 – 2022-10-28 (×6): 100 ug via ORAL
  Filled 2022-10-23 (×6): qty 1

## 2022-10-23 MED ORDER — PREGABALIN 75 MG PO CAPS
150.0000 mg | ORAL_CAPSULE | Freq: Every day | ORAL | Status: DC
  Administered 2022-10-23 – 2022-10-27 (×6): 150 mg via ORAL
  Filled 2022-10-23 (×6): qty 2

## 2022-10-23 MED ORDER — ORAL CARE MOUTH RINSE: 15.0000 mL | OROMUCOSAL | Status: DC | PRN

## 2022-10-23 MED ORDER — ADULT MULTIVITAMIN W/MINERALS CH
1.0000 | ORAL_TABLET | Freq: Every day | ORAL | Status: DC
  Administered 2022-10-23 – 2022-10-28 (×6): 1 via ORAL
  Filled 2022-10-23 (×6): qty 1

## 2022-10-23 MED ORDER — MIDODRINE HCL 5 MG PO TABS
5.0000 mg | ORAL_TABLET | Freq: Three times a day (TID) | ORAL | Status: DC
  Administered 2022-10-23 – 2022-10-28 (×17): 5 mg via ORAL
  Filled 2022-10-23 (×17): qty 1

## 2022-10-23 MED ORDER — HEPARIN SODIUM (PORCINE) 5000 UNIT/ML IJ SOLN
5000.0000 [IU] | Freq: Two times a day (BID) | INTRAMUSCULAR | Status: DC
  Administered 2022-10-23 – 2022-10-28 (×12): 5000 [IU] via SUBCUTANEOUS
  Filled 2022-10-23 (×12): qty 1

## 2022-10-23 MED ORDER — SODIUM CHLORIDE 0.9% FLUSH
3.0000 mL | Freq: Two times a day (BID) | INTRAVENOUS | Status: DC
  Administered 2022-10-23 – 2022-10-27 (×10): 3 mL via INTRAVENOUS

## 2022-10-23 MED ORDER — ACETAMINOPHEN 325 MG PO TABS
650.0000 mg | ORAL_TABLET | Freq: Four times a day (QID) | ORAL | Status: DC | PRN
  Administered 2022-10-24 – 2022-10-28 (×3): 650 mg via ORAL
  Filled 2022-10-23 (×3): qty 2

## 2022-10-23 MED ORDER — PANTOPRAZOLE SODIUM 40 MG PO TBEC
40.0000 mg | DELAYED_RELEASE_TABLET | Freq: Two times a day (BID) | ORAL | Status: DC
  Administered 2022-10-23 – 2022-10-28 (×10): 40 mg via ORAL
  Filled 2022-10-23 (×10): qty 1

## 2022-10-23 MED ORDER — VENLAFAXINE HCL 37.5 MG PO TABS
75.0000 mg | ORAL_TABLET | Freq: Two times a day (BID) | ORAL | Status: DC
  Administered 2022-10-23 – 2022-10-28 (×11): 75 mg via ORAL
  Filled 2022-10-23 (×11): qty 2

## 2022-10-23 MED ORDER — PREGABALIN 75 MG PO CAPS
75.0000 mg | ORAL_CAPSULE | Freq: Every day | ORAL | Status: DC
  Administered 2022-10-23 – 2022-10-28 (×6): 75 mg via ORAL
  Filled 2022-10-23 (×6): qty 1

## 2022-10-23 MED ORDER — ASPIRIN 81 MG PO TBEC
81.0000 mg | DELAYED_RELEASE_TABLET | Freq: Every day | ORAL | Status: DC
  Administered 2022-10-23 – 2022-10-27 (×6): 81 mg via ORAL
  Filled 2022-10-23 (×6): qty 1

## 2022-10-23 MED ORDER — MONTELUKAST SODIUM 10 MG PO TABS
10.0000 mg | ORAL_TABLET | Freq: Every day | ORAL | Status: DC
  Administered 2022-10-23 – 2022-10-27 (×6): 10 mg via ORAL
  Filled 2022-10-23 (×6): qty 1

## 2022-10-23 MED ORDER — TRAZODONE HCL 100 MG PO TABS
100.0000 mg | ORAL_TABLET | Freq: Every day | ORAL | Status: DC
  Administered 2022-10-23 – 2022-10-27 (×6): 100 mg via ORAL
  Filled 2022-10-23 (×6): qty 1

## 2022-10-23 MED ORDER — CITALOPRAM HYDROBROMIDE 20 MG PO TABS: 20.0000 mg | ORAL_TABLET | Freq: Every day | ORAL | Status: DC

## 2022-10-23 MED ORDER — SODIUM CHLORIDE 0.9 % IV SOLN: INTRAVENOUS | Status: AC

## 2022-10-23 MED ORDER — ATORVASTATIN CALCIUM 20 MG PO TABS
20.0000 mg | ORAL_TABLET | Freq: Every day | ORAL | Status: DC
  Administered 2022-10-23 – 2022-10-27 (×6): 20 mg via ORAL
  Filled 2022-10-23 (×6): qty 1

## 2022-10-23 MED ORDER — MORPHINE SULFATE (PF) 2 MG/ML IV SOLN
2.0000 mg | INTRAVENOUS | Status: DC | PRN
  Administered 2022-10-23 – 2022-10-24 (×5): 2 mg via INTRAVENOUS
  Filled 2022-10-23 (×5): qty 1

## 2022-10-23 MED ORDER — PANTOPRAZOLE SODIUM 40 MG IV SOLR
40.0000 mg | Freq: Two times a day (BID) | INTRAVENOUS | Status: DC
  Administered 2022-10-23 (×2): 40 mg via INTRAVENOUS
  Filled 2022-10-23 (×2): qty 10

## 2022-10-23 NOTE — Assessment & Plan Note (Signed)
Fall precaution. Suspect orthostatic hypotension.

## 2022-10-23 NOTE — Assessment & Plan Note (Signed)
Cont levothyroxine 100 mcg.  

## 2022-10-23 NOTE — Progress Notes (Signed)
PROGRESS NOTE    Kimberly Cook  [id number] DOB: 1946/01/27 DOA: 10/22/2022 PCP: System, Provider Not In   Assessment & Plan:   Principal Problem:   Falls, initial encounter Active Problems:   Back pain   T7 vertebral fracture (HCC)   HFrEF (heart failure with reduced ejection fraction) ([facility])   CKD Stage IIIa   Hypothyroidism   Coronary artery disease involving native coronary artery of native heart without angina pectoris  Assessment and Plan: Falls: etiology unclear, possibly generalized weakness vs mechanical fall. Actually fell getting out of the shower and there was no towel on the ground   Back pain: likely secondary to recent fall. Hx of chronic back pain & degenerative disc disease. Morphine prn   T7 vertebral fracture: no evidence of instrumentation failure at level T7-8 as per neuro surg. PT/OT consulted   Thrombocytopenia: etiology unclear. Will continue to monitor   Chronic systolic CHF: appears euvolemic. Holding losartan, torsemide. Continue on aspirin, statin  HLD: continue on statin   Hypothyroidism: continue on levothyroxine   Hx of CAD: nonobstructive. Continue on aspirin, statin    DVT prophylaxis:  heparin SQ  Code Status: DNR Family Communication:  Disposition Plan:  PT/OT need to evaluate pt   Status is: Observation The patient remains OBS appropriate and will d/c before 2 midnights.  Level of care: Telemetry Cardiac Consultants:  Neuro surg   Procedures:    Antimicrobials:    Subjective: Pt c/o back pain   Objective: Vitals:   10/23/22 0352 10/23/22 0354 10/23/22 0400 10/23/22 0600  BP: 106/83  99/76 95/75  Pulse: 89  88 93  Resp: 18  18 18   Temp:  98.2 F (36.8 C)    TempSrc:  Oral    SpO2: 95%  95% 93%  Weight:      Height:       No intake or output data in the 24 hours ending 10/23/22 0902 Filed Weights   10/22/22 1654  Weight: 86.6 kg    Examination:  General exam: Appears calm and comfortable   Respiratory system: Clear to auscultation. Respiratory effort normal. Cardiovascular system: S1 & S2+. No rubs, gallops or clicks.  Gastrointestinal system: Abdomen is nondistended, soft and nontender.  Normal bowel sounds heard. Central nervous system: Alert and oriented. Moves all extremities  Psychiatry: Judgement and insight appear normal. Mood & affect appropriate.     Data Reviewed: I have personally reviewed following labs and imaging studies  CBC: Recent Labs  Lab 10/22/22 2349 10/23/22 0330  WBC 4.9 5.3  NEUTROABS 3.5  --   HGB 13.0 12.4  HCT 37.0 35.9*  MCV 89.4 90.2  PLT 135* [id number]*   Basic Metabolic Panel: Recent Labs  Lab 10/22/22 2349 10/23/22 0330  NA 134* 135  K 3.1* 3.5  CL 99 102  CO2 23 24  GLUCOSE 106* 111*  BUN 10 12  CREATININE 1.06* 1.07*  CALCIUM 8.8* 8.6*   GFR: Estimated Creatinine Clearance: 46.2 mL/min (A) (by C-G formula based on SCr of 1.07 mg/dL (H)). Liver Function Tests: Recent Labs  Lab 10/23/22 0330  AST 21  ALT 15  ALKPHOS 58  BILITOT 0.7  PROT 6.8  ALBUMIN 3.5   No results for input(s): "LIPASE", "AMYLASE" in the last 168 hours. No results for input(s): "AMMONIA" in the last 168 hours. Coagulation Profile: No results for input(s): "INR", "PROTIME" in the last 168 hours. Cardiac Enzymes: Recent Labs  Lab 10/23/22 0330  CKTOTAL 37*   BNP (last  3 results) No results for input(s): "PROBNP" in the last 8760 hours. HbA1C: No results for input(s): "HGBA1C" in the last 72 hours. CBG: No results for input(s): "GLUCAP" in the last 168 hours. Lipid Profile: No results for input(s): "CHOL", "HDL", "LDLCALC", "TRIG", "CHOLHDL", "LDLDIRECT" in the last 72 hours. Thyroid Function Tests: No results for input(s): "TSH", "T4TOTAL", "FREET4", "T3FREE", "THYROIDAB" in the last 72 hours. Anemia Panel: No results for input(s): "VITAMINB12", "FOLATE", "FERRITIN", "TIBC", "IRON", "RETICCTPCT" in the last 72 hours. Sepsis Labs: No  results for input(s): "PROCALCITON", "LATICACIDVEN" in the last 168 hours.  No results found for this or any previous visit (from the past 240 hour(s)).       Radiology Studies: CT Lumbar Spine Wo Contrast  Result Date: 10/22/2022 CLINICAL DATA:  Back trauma, no prior imaging (Age >= 16y).  Fall EXAM: CT THORACIC AND LUMBAR SPINE WITHOUT CONTRAST TECHNIQUE: Multidetector CT imaging of the thoracic and lumbar spine was performed without contrast. Multiplanar CT image reconstructions were also generated. RADIATION DOSE REDUCTION: This exam was performed according to the departmental dose-optimization program which includes automated exposure control, adjustment of the mA and/or kV according to patient size and/or use of iterative reconstruction technique. COMPARISON:  11/03/2019 FINDINGS: CT THORACIC SPINE FINDINGS Alignment: Thoracolumbar scoliosis, apex left at T12 appears stable. Interval T4-bilateral iliac spinal fusion with bilateral pedicle screws at T4-L1 with posterior bars. Accentuated thoracic kyphosis. No listhesis. Vertebrae: Osseous structures are diffusely osteopenic. There is a minimally displaced axially oriented acute to subacute fracture through the T7 vertebral body just superior to the inferior endplate which extends through the anterior and posterior walls of the vertebral body without significant loss of height. There is gas along the fracture plane noted. The posterior elements appear intact. No other fracture identified. Remaining vertebral body height is preserved. No significant periprosthetic lucency. No hardware fracture. Paraspinal and other soft tissues: There is mild paravertebral infiltration surrounding the T7 vertebral body in keeping with a small amount of surrounding interstitial hemorrhage or edema. No canal hematoma. The paraspinal soft tissues are otherwise unremarkable. Incidentally noted is moderate coronary artery calcification. Surgical changes of gastric bypass  and cholecystectomy noted. Disc levels: There is intervertebral disc space narrowing and endplate remodeling throughout thoracic spine in keeping with changes of diffuse degenerative disc disease. No high-grade canal stenosis or neuroforaminal narrowing. CT LUMBAR SPINE FINDINGS Segmentation: 5 lumbar type vertebrae. Alignment: Interval thoracic-bi iliac fusion with bilateral pedicle screws, posterior bars, interbody bone cages, and bilateral iliac screws identified. Normal lumbar lordosis. Stable mild lumbar levoscoliosis, apex left at L3. No listhesis. Vertebrae: Osseous structures are diffusely osteopenic. No acute fracture. Vertebral body height is preserved. No significant periprosthetic lucency or hardware fracture to suggest hardware failure. No definite incorporation of interbody bone graft at L5-S1. Paraspinal and other soft tissues: Negative. Disc levels: Facet hypertrophy in combination with disc height loss contributes to mild to moderate right neuroforaminal narrowing at L1-L5 and left neuroforaminal narrowing at L4-5, most severe on the right at L1-2. No high-grade canal stenosis. IMPRESSION: 1. Interval T4-bilateral iliac spinal fusion with instrumentation as described above. No significant periprosthetic lucency or hardware fracture to suggest hardware failure. No definite incorporation of interbody bone graft at L5-S1. 2. Minimally displaced axially oriented acute to subacute fracture through the T7 vertebral body without significant loss of height or associated listhesis. Posterior elements appear intact. No canal hematoma. Mild surrounding paravertebral edema. 3. No acute fracture or listhesis of the lumbar spine. 4. Multilevel degenerative disc and degenerative  joint disease resulting in mild to moderate right neuroforaminal narrowing at L1-L5 and left neuroforaminal narrowing at L4-5, most severe on the right at L1-2. Electronically Signed   By: Fidela Salisbury M.D.   On: 10/22/2022 22:46   CT  Thoracic Spine Wo Contrast  Result Date: 10/22/2022 CLINICAL DATA:  Back trauma, no prior imaging (Age >= 16y).  Fall EXAM: CT THORACIC AND LUMBAR SPINE WITHOUT CONTRAST TECHNIQUE: Multidetector CT imaging of the thoracic and lumbar spine was performed without contrast. Multiplanar CT image reconstructions were also generated. RADIATION DOSE REDUCTION: This exam was performed according to the departmental dose-optimization program which includes automated exposure control, adjustment of the mA and/or kV according to patient size and/or use of iterative reconstruction technique. COMPARISON:  11/03/2019 FINDINGS: CT THORACIC SPINE FINDINGS Alignment: Thoracolumbar scoliosis, apex left at T12 appears stable. Interval T4-bilateral iliac spinal fusion with bilateral pedicle screws at T4-L1 with posterior bars. Accentuated thoracic kyphosis. No listhesis. Vertebrae: Osseous structures are diffusely osteopenic. There is a minimally displaced axially oriented acute to subacute fracture through the T7 vertebral body just superior to the inferior endplate which extends through the anterior and posterior walls of the vertebral body without significant loss of height. There is gas along the fracture plane noted. The posterior elements appear intact. No other fracture identified. Remaining vertebral body height is preserved. No significant periprosthetic lucency. No hardware fracture. Paraspinal and other soft tissues: There is mild paravertebral infiltration surrounding the T7 vertebral body in keeping with a small amount of surrounding interstitial hemorrhage or edema. No canal hematoma. The paraspinal soft tissues are otherwise unremarkable. Incidentally noted is moderate coronary artery calcification. Surgical changes of gastric bypass and cholecystectomy noted. Disc levels: There is intervertebral disc space narrowing and endplate remodeling throughout thoracic spine in keeping with changes of diffuse degenerative disc  disease. No high-grade canal stenosis or neuroforaminal narrowing. CT LUMBAR SPINE FINDINGS Segmentation: 5 lumbar type vertebrae. Alignment: Interval thoracic-bi iliac fusion with bilateral pedicle screws, posterior bars, interbody bone cages, and bilateral iliac screws identified. Normal lumbar lordosis. Stable mild lumbar levoscoliosis, apex left at L3. No listhesis. Vertebrae: Osseous structures are diffusely osteopenic. No acute fracture. Vertebral body height is preserved. No significant periprosthetic lucency or hardware fracture to suggest hardware failure. No definite incorporation of interbody bone graft at L5-S1. Paraspinal and other soft tissues: Negative. Disc levels: Facet hypertrophy in combination with disc height loss contributes to mild to moderate right neuroforaminal narrowing at L1-L5 and left neuroforaminal narrowing at L4-5, most severe on the right at L1-2. No high-grade canal stenosis. IMPRESSION: 1. Interval T4-bilateral iliac spinal fusion with instrumentation as described above. No significant periprosthetic lucency or hardware fracture to suggest hardware failure. No definite incorporation of interbody bone graft at L5-S1. 2. Minimally displaced axially oriented acute to subacute fracture through the T7 vertebral body without significant loss of height or associated listhesis. Posterior elements appear intact. No canal hematoma. Mild surrounding paravertebral edema. 3. No acute fracture or listhesis of the lumbar spine. 4. Multilevel degenerative disc and degenerative joint disease resulting in mild to moderate right neuroforaminal narrowing at L1-L5 and left neuroforaminal narrowing at L4-5, most severe on the right at L1-2. Electronically Signed   By: Fidela Salisbury M.D.   On: 10/22/2022 22:46   CT Head Wo Contrast  Result Date: 10/22/2022 CLINICAL DATA:  Trauma. EXAM: CT HEAD WITHOUT CONTRAST CT CERVICAL SPINE WITHOUT CONTRAST TECHNIQUE: Multidetector CT imaging of the head and  cervical spine was performed following the standard protocol  without intravenous contrast. Multiplanar CT image reconstructions of the cervical spine were also generated. RADIATION DOSE REDUCTION: This exam was performed according to the departmental dose-optimization program which includes automated exposure control, adjustment of the mA and/or kV according to patient size and/or use of iterative reconstruction technique. COMPARISON:  None Available. FINDINGS: CT HEAD FINDINGS Brain: Mild age-related atrophy and chronic microvascular ischemic changes. There is no acute intracranial hemorrhage. No mass effect or midline shift. No extra-axial fluid collection. Vascular: No hyperdense vessel or unexpected calcification. Skull: Normal. Negative for fracture or focal lesion. Sinuses/Orbits: No acute finding. Other: None CT CERVICAL SPINE FINDINGS Alignment: No acute subluxation. Skull base and vertebrae: No acute fracture.  Osteopenia. Soft tissues and spinal canal: No prevertebral fluid or swelling. No visible canal hematoma. Disc levels:  No acute findings.  Degenerative changes. Upper chest: Negative. Other: None IMPRESSION: 1. No acute intracranial pathology. Mild age-related atrophy and chronic microvascular ischemic changes. 2. No acute/traumatic cervical spine pathology. Electronically Signed   By: Anner Crete M.D.   On: 10/22/2022 22:30   CT Cervical Spine Wo Contrast  Result Date: 10/22/2022 CLINICAL DATA:  Trauma. EXAM: CT HEAD WITHOUT CONTRAST CT CERVICAL SPINE WITHOUT CONTRAST TECHNIQUE: Multidetector CT imaging of the head and cervical spine was performed following the standard protocol without intravenous contrast. Multiplanar CT image reconstructions of the cervical spine were also generated. RADIATION DOSE REDUCTION: This exam was performed according to the departmental dose-optimization program which includes automated exposure control, adjustment of the mA and/or kV according to patient size  and/or use of iterative reconstruction technique. COMPARISON:  None Available. FINDINGS: CT HEAD FINDINGS Brain: Mild age-related atrophy and chronic microvascular ischemic changes. There is no acute intracranial hemorrhage. No mass effect or midline shift. No extra-axial fluid collection. Vascular: No hyperdense vessel or unexpected calcification. Skull: Normal. Negative for fracture or focal lesion. Sinuses/Orbits: No acute finding. Other: None CT CERVICAL SPINE FINDINGS Alignment: No acute subluxation. Skull base and vertebrae: No acute fracture.  Osteopenia. Soft tissues and spinal canal: No prevertebral fluid or swelling. No visible canal hematoma. Disc levels:  No acute findings.  Degenerative changes. Upper chest: Negative. Other: None IMPRESSION: 1. No acute intracranial pathology. Mild age-related atrophy and chronic microvascular ischemic changes. 2. No acute/traumatic cervical spine pathology. Electronically Signed   By: Anner Crete M.D.   On: 10/22/2022 22:30        Scheduled Meds:  aspirin EC  81 mg Oral QHS   atorvastatin  20 mg Oral QHS   heparin  5,000 Units Subcutaneous Q12H   levothyroxine  100 mcg Oral Q0600   midodrine  5 mg Oral TID WC   montelukast  10 mg Oral QHS   pantoprazole (PROTONIX) IV  40 mg Intravenous Q12H   pregabalin  150 mg Oral QHS   pregabalin  75 mg Oral Daily   sodium chloride flush  3 mL Intravenous Q12H   traZODone  100 mg Oral QHS   venlafaxine  75 mg Oral BID   Continuous Infusions:  sodium chloride 20 mL/hr at 10/23/22 0401     LOS: 0 days    Time spent: 35 mins    Wyvonnia Dusky, MD [facility] Pager [phone]  If 7PM-7AM, please contact night-coverage [email or web address] 10/23/2022, 9:02 AM

## 2022-10-23 NOTE — Assessment & Plan Note (Signed)
Lab Results  Component Value Date   CREATININE 1.06 (H) 10/22/2022   CREATININE 1.00 10/02/2022   CREATININE 0.86 09/19/2022  Stable. Avoid contrast studies. Renally dose meds.

## 2022-10-23 NOTE — Assessment & Plan Note (Addendum)
Stable.compensated. euvolemic.  Strict I/O. Cont asa/ statin/ Hold losartan/ hold torsemide.  Vitals:   10/22/22 1659 10/22/22 2030 10/23/22 0030  BP: (!) 109/57 122/88 (!) 137/99

## 2022-10-23 NOTE — H&P (Addendum)
History and Physical    Chief Complaint: LBP.  HISTORY OF PRESENT ILLNESS: Kimberly Cook is an 77 y.o. female seen in ed after fall and found to have a spinal fracture.  [facility] and fell while getting out of shower and struck his head and back.  Came in with neck and back pain. Pt has h/o back surgery , no  weakness, no incontinence.  No saddle numbness.  Patient has past medical history of heart disease,syncope,  echo from July 2023 showed an EF of 45 to 50%:  1. Left ventricular ejection fraction, by estimation, is 45 to 50%. The  left ventricle has mildly decreased function. The left ventricle has no  regional wall motion abnormalities. Left ventricular diastolic parameters  are indeterminate. The average left   ventricular global longitudinal strain is -16.8 %.   2. Right ventricular systolic function is normal. The right ventricular  size is normal. There is normal pulmonary artery systolic pressure.   3. The mitral valve is normal in structure. No evidence of mitral valve  regurgitation. No evidence of mitral stenosis.   4. The aortic valve is normal in structure. Aortic valve regurgitation is  not visualized. No aortic stenosis is present.   5. The inferior vena cava is normal in size with greater than 50%  respiratory variability, suggesting right atrial pressure of 3 mmHg.   Patient had a left heart cath in January 2023 showing EF of 30 to 35% and nonobstructive CAD:   Prox RCA lesion is 20% stenosed.  1.  Mild nonobstructive coronary artery disease. 2.  Left ventricular angiography was not performed.  EF was 30 to 35% by echo. 3.  Right heart catheterization showed mildly elevated right and left filling pressures, mild pulmonary hypertension and mildly reduced cardiac output.  Pt has  Past Medical History:  Diagnosis Date   Acute respiratory failure, unsp w hypoxia or hypercapnia (HCC)    Allergy    Anxiety    a.) on BZO (alprazolam) PRN    Aortic atherosclerosis (HCC)    Arthritis    Back pain    Chronic HFrEF (heart failure with reduced ejection fraction) ([place])    a. 08/2021 echo: EF 30-35%, glob HK, GrI DD; b. 10/2021 Echo: EF 30-35%.   CKD (chronic kidney disease), stage II    Complication of anesthesia    Slow to clear meds after "14 hour back surgery"   Coronary artery disease    a. Mild to moderate CAD in LAD/diagonal by CTA (CT-FFR of apical LAD 0.79); b. 08/2021 Cath: LM nl, LAD min irregs, D1/2/3 nl, LCX nl, OM2/3 nl, RCA 20p, RPDA mild dzs, RPAV nl.   DDD (degenerative disc disease), lumbar    Depression    Diverticulosis    Essential hypertension    GERD (gastroesophageal reflux disease)    Headache    Hiatal hernia    History of shingles    HLD (hyperlipidemia)    Hypothyroidism    Lung nodule    Mini stroke 2011   Moderate Pericardial effusion    a. 08/2021 Echo: moderate circumferential pericardial effusion w/o tamponade; b. 10/2021 Echo: EF 30-35%, small to mod circumferential pericardial effusion w/o tamponade.   Muscle weakness    NICM (nonischemic cardiomyopathy) ([place])    a. 08/2021 Echo: EF 30-35%, glob HK, GrI DD, nl RV fxn, mild-mod dil LA, mod circumferential pericardial eff w/o tamponade, Mod MR; b. 10/2021 Echo: EF 30-35%, glob HK.  Occipital neuralgia    On supplemental oxygen by nasal cannula    a.) 2L.[place] at bedtime and PRN   Palpitations    Pancytopenia (HCC)    Pleural effusion on left    a. 08/2021 s/p thoracentesis.   Pneumonia 2018   Polyneuropathy    Prediabetes    Renal cyst, left    a.) Renal artery [place] 12/26/2017: measured 2.1 cm.   Sleep difficulties    a.) takes trazodone + melatonin   Stroke ([place])    MRI 04/2008 + left sup. frontal gyrus possibly puntate infarct    Syncope 2019   Uses wheelchair    able to stand and self transfer   Vitamin B deficiency    Vitamin D deficiency    Review of Systems  Musculoskeletal:  Positive for back pain.       Falls.   Neurological:   Positive for weakness.   Allergies  Allergen Reactions   Vibegron Other (See Comments)    Not listed, received from facility paperwork. Pt denies   Past Surgical History:  Procedure Laterality Date   ABDOMINAL HYSTERECTOMY     BLADDER SURGERY     2003   BREAST EXCISIONAL BIOPSY Right Over 20 years    Benign   CATARACT EXTRACTION W/PHACO Right 10/09/2022   Procedure: CATARACT EXTRACTION PHACO AND INTRAOCULAR LENS PLACEMENT ([place]) RIGHT  8.13  00:43.5;  Surgeon: Leandrew Koyanagi, MD;  Location: [facility];  Service: Ophthalmology;  Laterality: Right;   CHOLECYSTECTOMY     CYSTOSCOPY W/ URETERAL STENT PLACEMENT Left 05/13/2022   Procedure: CYSTOSCOPY WITH RETROGRADE PYELOGRAM/URETERAL LEFT STENT PLACEMENT;  Surgeon: Hollice Espy, MD;  Location: [facility];  Service: Urology;  Laterality: Left;   CYSTOSCOPY WITH BIOPSY N/A 05/13/2022   Procedure: CYSTOSCOPY WITH BLADDER BIOPSY;  Surgeon: Hollice Espy, MD;  Location: [facility];  Service: Urology;  Laterality: N/A;   gastroplication      KNEE ARTHROSCOPY Left 2011   PULSE GENERATOR IMPLANT Left 01/31/2020   Procedure: PLACEMENT RIGHT FLANK PULSE GENERATOR VS REMOVAL SPINAL CORD STIMULATOR;  Surgeon: Deetta Perla, MD;  Location: [facility];  Service: Neurosurgery;  Laterality: Left;   PULSE GENERATOR IMPLANT Left 04/24/2020   Procedure: REPLACEMENT LEFT FLANK PULSE GENERATOR IMPLANT;  Surgeon: Deetta Perla, MD;  Location: [facility];  Service: Neurosurgery;  Laterality: Left;  MAC w/ local   REPLACEMENT TOTAL KNEE Left    right arm fracture     RIGHT/LEFT HEART CATH AND CORONARY ANGIOGRAPHY N/A 08/28/2021   Procedure: RIGHT/LEFT HEART CATH AND CORONARY ANGIOGRAPHY;  Surgeon: Wellington Hampshire, MD;  Location: [facility];  Service: Cardiovascular;  Laterality: N/A;   SPINAL CORD STIMULATOR REMOVAL N/A 06/26/2020   Procedure: SPINAL CORD STIMULATOR REMOVAL;  Surgeon: Deetta Perla, MD;  Location: [facility];  Service:  Neurosurgery;  Laterality: N/A;   THORACIC LAMINECTOMY FOR SPINAL CORD STIMULATOR N/A 01/24/2020   Procedure: THORACIC SPINAL CORD STIMULATOR PADDLE TRIAL VIA LAMINECTOMY;  Surgeon: Deetta Perla, MD;  Location: [facility];  Service: Neurosurgery;  Laterality: N/A;   TONSILLECTOMY AND ADENOIDECTOMY     TOTAL KNEE ARTHROPLASTY Left 04/17/2015   Procedure: LEFT TOTAL KNEE ARTHROPLASTY;  Surgeon: Paralee Cancel, MD;  Location: [facility];  Service: Orthopedics;  Laterality: Left;    MEDICATIONS: Current Outpatient Medications  Medication Instructions   acetaminophen (TYLENOL) 650 mg, Oral, Every 8 hours PRN   alendronate (FOSAMAX) 70 mg, Oral, Weekly   ALPRAZolam (XANAX) 0.5 mg, Oral, 2 times daily PRN  aspirin EC 81 mg, Oral, Daily at bedtime, Swallow whole.    atorvastatin (LIPITOR) 20 mg, Oral, Daily   calcium-vitamin D (OSCAL WITH D) 500-5 MG-MCG tablet 1 tablet, Oral, 2 times daily   carboxymethylcellulose 1 % ophthalmic solution 1 drop, Ophthalmic, Daily at bedtime   cetirizine (ZYRTEC) 5 mg, Oral, Daily   citalopram (CELEXA) 20 mg, Oral, Daily   Cranberry 450 MG TABS 1 tablet, Oral, Every morning   Docusate Calcium (STOOL SOFTENER PO) Oral, 2 capsules 2 times daily   HYDROcodone-acetaminophen (NORCO/VICODIN) 5-325 MG tablet 1 tablet, Oral, Every 6 hours PRN   hydrocortisone cream 1 % 1 Application, Topical, 2 times daily PRN   levothyroxine (SYNTHROID) 100 mcg, Oral, Daily before breakfast   losartan (COZAAR) 12.5 mg, Oral, Daily   melatonin 5 mg, Oral, Daily at bedtime   midodrine (PROAMATINE) 5 mg, Oral, 3 times daily with meals   montelukast (SINGULAIR) 10 mg, Oral, Daily at bedtime   nystatin powder 1 Application, Topical, Every 6 hours PRN   omeprazole (PRILOSEC) 40 mg, Oral, Daily, After lunch   ondansetron (ZOFRAN) 4 mg, Oral, Every 8 hours PRN   oxybutynin (DITROPAN) 5 mg, Oral, Every 8 hours PRN   OXYGEN Inhalation, Uses for exertion    polyethylene glycol (MIRALAX / GLYCOLAX)  17 g, Oral, Daily   POTASSIUM CHLORIDE ER PO 20 mEq, Oral, 2 times daily   pregabalin (LYRICA) 75 mg, Oral, BH-each morning   pregabalin (LYRICA) 150 mg, Oral, Daily at bedtime   simethicone (MYLICON) 80 mg, Oral, Every 6 hours PRN   torsemide (DEMADEX) 20 mg, Oral, Daily   traZODone (DESYREL) 100 mg, Oral, Daily at bedtime   venlafaxine (EFFEXOR) 75 mg, Oral, 2 times daily   Vitamin D3 5,000 Units, Oral, Daily   White Petrolatum-Mineral Oil (ARTIFICIAL TEARS) ointment Daily at bedtime   ED Course: Pt in Ed alert awake oriented afebrile with O2 sats 98% and above on room air. Vitals:   10/22/22 1659 10/22/22 2030 10/23/22 0030 10/23/22 0044  BP: (!) 109/57 122/88 (!) 137/99   Pulse: 77 77 98   Resp: 20 18 18    Temp: 98.4 F (36.9 C) 98.4 F (36.9 C)  98.3 F (36.8 C)  TempSrc: Oral  Oral Oral  SpO2: 98% 100% 94%   Weight:      Height:      No intake/output data recorded. SpO2: 94 % Blood work in ed shows: CBC with a platelet count of 135 otherwise normal. CMP ordered and pending. Results for orders placed or performed during the hospital encounter of 10/22/22 (from the past 24 hour(s))  Basic metabolic panel     Status: Abnormal   Collection Time: 10/22/22 11:49 PM  Result Value Ref Range   Sodium 134 (L) 135 - 145 mmol/L   Potassium 3.1 (L) 3.5 - 5.1 mmol/L   Chloride 99 98 - 111 mmol/L   CO2 23 22 - 32 mmol/L   Glucose, Bld 106 (H) 70 - 99 mg/dL   BUN 10 8 - 23 mg/dL   Creatinine, Ser 1.06 (H) 0.44 - 1.00 mg/dL   Calcium 8.8 (L) 8.9 - 10.3 mg/dL   GFR, Estimated 54 (L) >60 mL/min   Anion gap 12 5 - 15  CBC with Differential     Status: Abnormal   Collection Time: 10/22/22 11:49 PM  Result Value Ref Range   WBC 4.9 4.0 - 10.5 K/uL   RBC 4.14 3.87 - 5.11 MIL/uL   Hemoglobin 13.0  12.0 - 15.0 g/dL   HCT 37.0 36.0 - 46.0 %   MCV 89.4 80.0 - 100.0 fL   MCH 31.4 26.0 - 34.0 pg   MCHC 35.1 30.0 - 36.0 g/dL   RDW 12.7 11.5 - 15.5 %   Platelets 135 (L) 150 - 400 K/uL    nRBC 0.0 0.0 - 0.2 %   Neutrophils Relative % 73 %   Neutro Abs 3.5 1.7 - 7.7 K/uL   Lymphocytes Relative 18 %   Lymphs Abs 0.9 0.7 - 4.0 K/uL   Monocytes Relative 9 %   Monocytes Absolute 0.4 0.1 - 1.0 K/uL   Eosinophils Relative 0 %   Eosinophils Absolute 0.0 0.0 - 0.5 K/uL   Basophils Relative 0 %   Basophils Absolute 0.0 0.0 - 0.1 K/uL   Immature Granulocytes 0 %   Abs Immature Granulocytes 0.02 0.00 - 0.07 K/uL   Unresulted Labs (From admission, onward)     Start     Ordered   10/23/22 0500  Comprehensive metabolic panel  Tomorrow morning,   STAT        10/23/22 0128   10/23/22 0500  CBC  Tomorrow morning,   STAT        10/23/22 0128   10/23/22 0129  Type and screen  Once,   STAT        10/23/22 0128   10/23/22 0057  CK  Once,   URGENT        10/23/22 0056           Pt has received : Orders Placed This Encounter  Procedures   CT Head Wo Contrast    Standing Status:   Standing    Number of Occurrences:   1   CT Cervical Spine Wo Contrast    Standing Status:   Standing    Number of Occurrences:   1   CT Lumbar Spine Wo Contrast    Standing Status:   Standing    Number of Occurrences:   1   CT Thoracic Spine Wo Contrast    Standing Status:   Standing    Number of Occurrences:   1   Basic metabolic panel    Standing Status:   Standing    Number of Occurrences:   1   CBC with Differential    Standing Status:   Standing    Number of Occurrences:   1   CK    Standing Status:   Standing    Number of Occurrences:   1   Comprehensive metabolic panel    Standing Status:   Standing    Number of Occurrences:   1   CBC    Standing Status:   Standing    Number of Occurrences:   1   Diet Heart Room service appropriate? Yes; Fluid consistency: Thin    Standing Status:   Standing    Number of Occurrences:   1    Order Specific Question:   Room service appropriate?    Answer:   Yes    Order Specific Question:   Fluid consistency:    Answer:   Thin    Orthostatic vital signs    Standing Status:   Standing    Number of Occurrences:   3   Maintain IV access    Standing Status:   Standing    Number of Occurrences:   1   Vital signs    Standing Status:   Standing  Number of Occurrences:   1   Notify physician (specify)    Standing Status:   Standing    Number of Occurrences:   20    Order Specific Question:   Notify Physician    Answer:   for pulse less than 55 or greater than 120    Order Specific Question:   Notify Physician    Answer:   for respiratory rate less than 12 or greater than 25    Order Specific Question:   Notify Physician    Answer:   for temperature greater than 100.5 F    Order Specific Question:   Notify Physician    Answer:   for urinary output less than 30 mL/hr for four hours    Order Specific Question:   Notify Physician    Answer:   for systolic BP less than 90 or greater than 102, diastolic BP less than 60 or greater than 100    Order Specific Question:   Notify Physician    Answer:   for new hypoxia w/ oxygen saturations < 88%   Progressive Mobility Protocol: No Restrictions    Standing Status:   Standing    Number of Occurrences:   1   Daily weights    Standing Status:   Standing    Number of Occurrences:   1   Intake and Output    Standing Status:   Standing    Number of Occurrences:   1   Initiate Oral Care Protocol    Standing Status:   Standing    Number of Occurrences:   1   Initiate Carrier Fluid Protocol    Standing Status:   Standing    Number of Occurrences:   1   RN may order General Admission PRN Orders utilizing "General Admission PRN medications" (through manage orders) for the following patient needs: allergy symptoms (Claritin), cold sores (Carmex), cough (Robitussin DM), eye irritation (Liquifilm Tears), hemorrhoids (Tucks), indigestion (Maalox), minor skin irritation (Hydrocortisone Cream), muscle pain ([place]), nose irritation (saline nasal spray) and sore throat (Chloraseptic  spray).    Standing Status:   Standing    Number of Occurrences:   [id number]   Cardiac Monitoring Continuous x 48 hours Indications for use: Other; Other indications for use: CAD falls vs syncope/ orthostatic hypotension.    Standing Status:   Standing    Number of Occurrences:   1    Order Specific Question:   Indications for use:    Answer:   Other    Order Specific Question:   Other indications for use:    Answer:   CAD falls vs syncope/ orthostatic hypotension.   Full code    Standing Status:   Standing    Number of Occurrences:   1    Order Specific Question:   By:    Answer:   Other   Do not attempt resuscitation (DNR)    Standing Status:   Standing    Number of Occurrences:   1    Order Specific Question:   If patient has no pulse and is not breathing    Answer:   Do Not Attempt Resuscitation    Order Specific Question:   If patient has a pulse and/or is breathing: Medical Treatment Goals    Answer:   LIMITED ADDITIONAL INTERVENTIONS: Use medication/IV fluids and cardiac monitoring as indicated; Do not use intubation or mechanical ventilation (DNI), also provide comfort medications.  Transfer to Progressive/Stepdown as indicated, avoid Intensive  Care.    Order Specific Question:   Consent:    Answer:   Discussion documented in EHR or advanced directives reviewed   Consult to hospitalist  [phone]    [id number]    Standing Status:   Standing    Number of Occurrences:   1    Order Specific Question:   Place call to:    Answer:   Hospitalist    Order Specific Question:   Reason for Consult    Answer:   Admit    Order Specific Question:   Diagnosis/Clinical Info for Consult:    Answer:   T7 fracture, pain control   Consult to neurosurgery Consult Timeframe: ROUTINE - requires response within 24 hours; Reason for Consult? LBP T7# / fall    Standing Status:   Standing    Number of Occurrences:   1    Order Specific Question:   Consult Timeframe    Answer:   ROUTINE - requires response within  24 hours    Order Specific Question:   Reason for Consult?    Answer:   LBP T7# / fall   Pulse oximetry check with vital signs    Standing Status:   Standing    Number of Occurrences:   1   Oxygen therapy Mode or (Route): Nasal cannula; Liters Per Minute: 2; Keep 02 saturation: greater than 92 %    Standing Status:   Standing    Number of Occurrences:   20    Order Specific Question:   Mode or (Route)    Answer:   Nasal cannula    Order Specific Question:   Liters Per Minute    Answer:   2    Order Specific Question:   Keep 02 saturation    Answer:   greater than 92 %   Type and screen    Standing Status:   Standing    Number of Occurrences:   1   Place in observation (patient's expected length of stay will be less than 2 midnights)    Standing Status:   Standing    Number of Occurrences:   1    Order Specific Question:   Hospital Area    Answer:   [place] [[id number]]    Order Specific Question:   Level of Care    Answer:   Telemetry Cardiac [103]    Order Specific Question:   Covid Evaluation    Answer:   Asymptomatic - no recent exposure (last 10 days) testing not required    Order Specific Question:   Diagnosis    Answer:   Fall [290176]    Order Specific Question:   Admitting Physician    Answer:   Cherylann Ratel    Order Specific Question:   Attending Physician    Answer:   Cherylann Ratel   Aspiration precautions    Standing Status:   Standing    Number of Occurrences:   1   Fall precautions    Standing Status:   Standing    Number of Occurrences:   1    Meds ordered this encounter  Medications   oxyCODONE-acetaminophen (PERCOCET/ROXICET) 5-325 MG per tablet 1 tablet   morphine (PF) 4 MG/ML injection 4 mg   morphine (PF) 2 MG/ML injection 2 mg   aspirin EC tablet 81 mg    Swallow whole.     atorvastatin (LIPITOR) tablet 20 mg   citalopram (CELEXA) tablet 20 mg  levothyroxine (SYNTHROID) tablet 100 mcg   venlafaxine  (EFFEXOR) tablet 75 mg   traZODone (DESYREL) tablet 100 mg   pregabalin (LYRICA) capsule 75 mg   pregabalin (LYRICA) capsule 150 mg   montelukast (SINGULAIR) tablet 10 mg   midodrine (PROAMATINE) tablet 5 mg   heparin injection 5,000 Units   sodium chloride flush (NS) 0.9 % injection 3 mL   0.9 %  sodium chloride infusion   OR Linked Order Group    acetaminophen (TYLENOL) tablet 650 mg    acetaminophen (TYLENOL) suppository 650 mg   pantoprazole (PROTONIX) injection 40 mg   Admission Imaging : CT Lumbar Spine Wo Contrast Result Date: 10/22/2022 CLINICAL DATA:  Back trauma, no prior imaging (Age >= 16y).  Fall EXAM: CT THORACIC AND LUMBAR SPINE WITHOUT CONTRAST TECHNIQUE: Multidetector CT imaging of the thoracic and lumbar spine was performed without contrast. Multiplanar CT image reconstructions were also generated. RADIATION DOSE REDUCTION: This exam was performed according to the departmental dose-optimization program which includes automated exposure control, adjustment of the mA and/or kV according to patient size and/or use of iterative reconstruction technique. COMPARISON:  11/03/2019 FINDINGS: CT THORACIC SPINE FINDINGS Alignment: Thoracolumbar scoliosis, apex left at T12 appears stable. Interval T4-bilateral iliac spinal fusion with bilateral pedicle screws at T4-L1 with posterior bars. Accentuated thoracic kyphosis. No listhesis. Vertebrae: Osseous structures are diffusely osteopenic. There is a minimally displaced axially oriented acute to subacute fracture through the T7 vertebral body just superior to the inferior endplate which extends through the anterior and posterior walls of the vertebral body without significant loss of height. There is gas along the fracture plane noted. The posterior elements appear intact. No other fracture identified. Remaining vertebral body height is preserved. No significant periprosthetic lucency. No hardware fracture. Paraspinal and other soft tissues:  There is mild paravertebral infiltration surrounding the T7 vertebral body in keeping with a small amount of surrounding interstitial hemorrhage or edema. No canal hematoma. The paraspinal soft tissues are otherwise unremarkable. Incidentally noted is moderate coronary artery calcification. Surgical changes of gastric bypass and cholecystectomy noted. Disc levels: There is intervertebral disc space narrowing and endplate remodeling throughout thoracic spine in keeping with changes of diffuse degenerative disc disease. No high-grade canal stenosis or neuroforaminal narrowing. CT LUMBAR SPINE FINDINGS Segmentation: 5 lumbar type vertebrae. Alignment: Interval thoracic-bi iliac fusion with bilateral pedicle screws, posterior bars, interbody bone cages, and bilateral iliac screws identified. Normal lumbar lordosis. Stable mild lumbar levoscoliosis, apex left at L3. No listhesis. Vertebrae: Osseous structures are diffusely osteopenic. No acute fracture. Vertebral body height is preserved. No significant periprosthetic lucency or hardware fracture to suggest hardware failure. No definite incorporation of interbody bone graft at L5-S1. Paraspinal and other soft tissues: Negative. Disc levels: Facet hypertrophy in combination with disc height loss contributes to mild to moderate right neuroforaminal narrowing at L1-L5 and left neuroforaminal narrowing at L4-5, most severe on the right at L1-2. No high-grade canal stenosis. IMPRESSION: 1. Interval T4-bilateral iliac spinal fusion with instrumentation as described above. No significant periprosthetic lucency or hardware fracture to suggest hardware failure. No definite incorporation of interbody bone graft at L5-S1. 2. Minimally displaced axially oriented acute to subacute fracture through the T7 vertebral body without significant loss of height or associated listhesis. Posterior elements appear intact. No canal hematoma. Mild surrounding paravertebral edema. 3. No acute  fracture or listhesis of the lumbar spine. 4. Multilevel degenerative disc and degenerative joint disease resulting in mild to moderate right neuroforaminal narrowing at L1-L5 and left neuroforaminal  narrowing at L4-5, most severe on the right at L1-2. Electronically Signed   By: Fidela Salisbury M.D.   On: 10/22/2022 22:46   CT Thoracic Spine Wo Contrast Result Date: 10/22/2022 CLINICAL DATA:  Back trauma, no prior imaging (Age >= 16y).  Fall EXAM: CT THORACIC AND LUMBAR SPINE WITHOUT CONTRAST TECHNIQUE: Multidetector CT imaging of the thoracic and lumbar spine was performed without contrast. Multiplanar CT image reconstructions were also generated. RADIATION DOSE REDUCTION: This exam was performed according to the departmental dose-optimization program which includes automated exposure control, adjustment of the mA and/or kV according to patient size and/or use of iterative reconstruction technique. COMPARISON:  11/03/2019 FINDINGS: CT THORACIC SPINE FINDINGS Alignment: Thoracolumbar scoliosis, apex left at T12 appears stable. Interval T4-bilateral iliac spinal fusion with bilateral pedicle screws at T4-L1 with posterior bars. Accentuated thoracic kyphosis. No listhesis. Vertebrae: Osseous structures are diffusely osteopenic. There is a minimally displaced axially oriented acute to subacute fracture through the T7 vertebral body just superior to the inferior endplate which extends through the anterior and posterior walls of the vertebral body without significant loss of height. There is gas along the fracture plane noted. The posterior elements appear intact. No other fracture identified. Remaining vertebral body height is preserved. No significant periprosthetic lucency. No hardware fracture. Paraspinal and other soft tissues: There is mild paravertebral infiltration surrounding the T7 vertebral body in keeping with a small amount of surrounding interstitial hemorrhage or edema. No canal hematoma. The paraspinal  soft tissues are otherwise unremarkable. Incidentally noted is moderate coronary artery calcification. Surgical changes of gastric bypass and cholecystectomy noted. Disc levels: There is intervertebral disc space narrowing and endplate remodeling throughout thoracic spine in keeping with changes of diffuse degenerative disc disease. No high-grade canal stenosis or neuroforaminal narrowing. CT LUMBAR SPINE FINDINGS Segmentation: 5 lumbar type vertebrae. Alignment: Interval thoracic-bi iliac fusion with bilateral pedicle screws, posterior bars, interbody bone cages, and bilateral iliac screws identified. Normal lumbar lordosis. Stable mild lumbar levoscoliosis, apex left at L3. No listhesis. Vertebrae: Osseous structures are diffusely osteopenic. No acute fracture. Vertebral body height is preserved. No significant periprosthetic lucency or hardware fracture to suggest hardware failure. No definite incorporation of interbody bone graft at L5-S1. Paraspinal and other soft tissues: Negative. Disc levels: Facet hypertrophy in combination with disc height loss contributes to mild to moderate right neuroforaminal narrowing at L1-L5 and left neuroforaminal narrowing at L4-5, most severe on the right at L1-2. No high-grade canal stenosis. IMPRESSION: 1. Interval T4-bilateral iliac spinal fusion with instrumentation as described above. No significant periprosthetic lucency or hardware fracture to suggest hardware failure. No definite incorporation of interbody bone graft at L5-S1. 2. Minimally displaced axially oriented acute to subacute fracture through the T7 vertebral body without significant loss of height or associated listhesis. Posterior elements appear intact. No canal hematoma. Mild surrounding paravertebral edema. 3. No acute fracture or listhesis of the lumbar spine. 4. Multilevel degenerative disc and degenerative joint disease resulting in mild to moderate right neuroforaminal narrowing at L1-L5 and left  neuroforaminal narrowing at L4-5, most severe on the right at L1-2. Electronically Signed   By: Fidela Salisbury M.D.   On: 10/22/2022 22:46   CT Head Wo Contrast Result Date: 10/22/2022 CLINICAL DATA:  Trauma. EXAM: CT HEAD WITHOUT CONTRAST CT CERVICAL SPINE WITHOUT CONTRAST TECHNIQUE: Multidetector CT imaging of the head and cervical spine was performed following the standard protocol without intravenous contrast. Multiplanar CT image reconstructions of the cervical spine were also generated. RADIATION DOSE REDUCTION: This  exam was performed according to the departmental dose-optimization program which includes automated exposure control, adjustment of the mA and/or kV according to patient size and/or use of iterative reconstruction technique. COMPARISON:  None Available. FINDINGS: CT HEAD FINDINGS Brain: Mild age-related atrophy and chronic microvascular ischemic changes. There is no acute intracranial hemorrhage. No mass effect or midline shift. No extra-axial fluid collection. Vascular: No hyperdense vessel or unexpected calcification. Skull: Normal. Negative for fracture or focal lesion. Sinuses/Orbits: No acute finding. Other: None CT CERVICAL SPINE FINDINGS Alignment: No acute subluxation. Skull base and vertebrae: No acute fracture.  Osteopenia. Soft tissues and spinal canal: No prevertebral fluid or swelling. No visible canal hematoma. Disc levels:  No acute findings.  Degenerative changes. Upper chest: Negative. Other: None IMPRESSION: 1. No acute intracranial pathology. Mild age-related atrophy and chronic microvascular ischemic changes. 2. No acute/traumatic cervical spine pathology. Electronically Signed   By: Anner Crete M.D.   On: 10/22/2022 22:30   CT Cervical Spine Wo Contrast Result Date: 10/22/2022 CLINICAL DATA:  Trauma. EXAM: CT HEAD WITHOUT CONTRAST CT CERVICAL SPINE WITHOUT CONTRAST TECHNIQUE: Multidetector CT imaging of the head and cervical spine was performed following the  standard protocol without intravenous contrast. Multiplanar CT image reconstructions of the cervical spine were also generated. RADIATION DOSE REDUCTION: This exam was performed according to the departmental dose-optimization program which includes automated exposure control, adjustment of the mA and/or kV according to patient size and/or use of iterative reconstruction technique. COMPARISON:  None Available. FINDINGS: CT HEAD FINDINGS Brain: Mild age-related atrophy and chronic microvascular ischemic changes. There is no acute intracranial hemorrhage. No mass effect or midline shift. No extra-axial fluid collection. Vascular: No hyperdense vessel or unexpected calcification. Skull: Normal. Negative for fracture or focal lesion. Sinuses/Orbits: No acute finding. Other: None CT CERVICAL SPINE FINDINGS Alignment: No acute subluxation. Skull base and vertebrae: No acute fracture.  Osteopenia. Soft tissues and spinal canal: No prevertebral fluid or swelling. No visible canal hematoma. Disc levels:  No acute findings.  Degenerative changes. Upper chest: Negative. Other: None IMPRESSION: 1. No acute intracranial pathology. Mild age-related atrophy and chronic microvascular ischemic changes. 2. No acute/traumatic cervical spine pathology. Electronically Signed   By: Anner Crete M.D.   On: 10/22/2022 22:30    Physical Examination: Vitals:   10/22/22 1659 10/22/22 2030 10/23/22 0030 10/23/22 0044  BP: (!) 109/57 122/88 (!) 137/99   Pulse: 77 77 98   Temp: 98.4 F (36.9 C) 98.4 F (36.9 C)  98.3 F (36.8 C)  Resp: 20 18 18    Height:      Weight:      SpO2: 98% 100% 94%   TempSrc: Oral  Oral Oral  BMI (Calculated):       Physical Exam Vitals reviewed.  Constitutional:      Appearance: She is obese.  HENT:     Head: Normocephalic.  Eyes:     Pupils: Pupils are equal, round, and reactive to light.  Cardiovascular:     Rate and Rhythm: Normal rate and regular rhythm.     Pulses:           Dorsalis pedis pulses are 2+ on the right side and 2+ on the left side.       Posterior tibial pulses are 2+ on the right side and 2+ on the left side.     Heart sounds: Normal heart sounds.  Pulmonary:     Effort: Pulmonary effort is normal.     Breath sounds: Normal breath  sounds.  Abdominal:     General: Bowel sounds are normal. There is no distension.     Palpations: Abdomen is soft. There is no mass.     Tenderness: There is no abdominal tenderness. There is no guarding.     Hernia: No hernia is present.  Musculoskeletal:        General: Normal range of motion.  Skin:    General: Skin is warm.  Neurological:     General: No focal deficit present.     Mental Status: She is alert and oriented to person, place, and time.  Psychiatric:        Mood and Affect: Mood normal.        Behavior: Behavior normal.     Assessment and Plan: * Falls, initial encounter Fall precaution. Suspect orthostatic hypotension.  Back pain 2/2 recent fall. Pt already has h./o low back  surgery and hardware for her DDD . Prn Morphine.  Neuro check.     T7 vertebral fracture (HCC) PRN morphine.bedrest.  NS Dr.Yarborough is consulted.  Am message sent.    HFrEF (heart failure with reduced ejection fraction) (HCC) Stable.compensated. euvolemic.  Strict I/O. Cont asa/ statin/ Hold losartan/ hold torsemide.  Vitals:   10/22/22 1659 10/22/22 2030 10/23/22 0030  BP: (!) 109/57 122/88 (!) 137/99     CKD Stage IIIa Lab Results  Component Value Date   CREATININE 1.06 (H) 10/22/2022   CREATININE 1.00 10/02/2022   CREATININE 0.86 09/19/2022  Stable. Avoid contrast studies. Renally dose meds.     Hypothyroidism Cont levothyroxine 100 mcg.    Coronary artery disease involving native coronary artery of native heart without angina pectoris Nonobstructive CAD. Stable cont asa and statin.     DVT prophylaxis:  Heparin.    Code Status:  DNR.  Family Communication:  Gene  Hutchens.  [id number].   Disposition Plan:  SNF.   Consults called:  NS. Dr Izora Ribas.   Admission status: Observation.   Unit/ Expected LOS: Med tele/ 1 days.    Para Skeans MD [facility]  6 PM- 2 AM. Please contact me via secure Chat 6 PM-2 AM. [phone]( Pager ) To contact the [facility] Attending or Consulting provider [place] or covering provider during after hours [place], for this patient.   Check the care team in [facility] and look for a) attending/consulting [facility] provider listed and b) the [facility] team listed Log into [email or web address] and use [place]'s universal password to access. If you do not have the password, please contact the hospital operator. Locate the [facility] provider you are looking for under [facility] and page to a number that you can be directly reached. If you still have difficulty reaching the provider, please page the [facility] (Director on Call) for the Hospitalists listed on amion for assistance. [email or web address] 10/23/2022, 1:49 AM

## 2022-10-23 NOTE — Assessment & Plan Note (Signed)
Nonobstructive CAD. Stable cont asa and statin.

## 2022-10-23 NOTE — Assessment & Plan Note (Signed)
PRN morphine.bedrest.  NS Dr.Yarborough is consulted.  Am message sent.

## 2022-10-23 NOTE — Consult Note (Signed)
Consult requested by:  [facility]  Consult requested for:  T7 fracture  Primary Physician:  System, Provider Not In  History of Present Illness: 10/23/2022 [name] is here today with a chief complaint of thoracic back pain after a fall on March 16.  She has no neurologic complaints.  She had a fall, which was her first fall in 7 months.  She had immediate onset of upper back pain.  She presented yesterday for evaluation due to continued back pain.  She is well-known to me, having undergone an extensive T4 to the pelvis instrumented fusion in 2022.  She had been making slow progress in her recovery from this.   The symptoms are causing a significant impact on the patient's life.   I have utilized the care everywhere function in epic to review the outside records available from external health systems.  Review of Systems:  A 10 point review of systems is negative, except for the pertinent positives and negatives detailed in the HPI.  Past Medical History: Past Medical History:  Diagnosis Date   Acute respiratory failure, unsp w hypoxia or hypercapnia (HCC)    Allergy    Anxiety    a.) on BZO (alprazolam) PRN   Aortic atherosclerosis (HCC)    Arthritis    Back pain    Chronic HFrEF (heart failure with reduced ejection fraction) ([place])    a. 08/2021 echo: EF 30-35%, glob HK, GrI DD; b. 10/2021 Echo: EF 30-35%.   CKD (chronic kidney disease), stage II    Complication of anesthesia    Slow to clear meds after "14 hour back surgery"   Coronary artery disease    a. Mild to moderate CAD in LAD/diagonal by CTA (CT-FFR of apical LAD 0.79); b. 08/2021 Cath: LM nl, LAD min irregs, D1/2/3 nl, LCX nl, OM2/3 nl, RCA 20p, RPDA mild dzs, RPAV nl.   DDD (degenerative disc disease), lumbar    Depression    Diverticulosis    Essential hypertension    GERD (gastroesophageal reflux disease)    Headache    Hiatal hernia    History of shingles    HLD (hyperlipidemia)     Hypothyroidism    Lung nodule    Mini stroke 2011   Moderate Pericardial effusion    a. 08/2021 Echo: moderate circumferential pericardial effusion w/o tamponade; b. 10/2021 Echo: EF 30-35%, small to mod circumferential pericardial effusion w/o tamponade.   Muscle weakness    NICM (nonischemic cardiomyopathy) ([place])    a. 08/2021 Echo: EF 30-35%, glob HK, GrI DD, nl RV fxn, mild-mod dil LA, mod circumferential pericardial eff w/o tamponade, Mod MR; b. 10/2021 Echo: EF 30-35%, glob HK.   Occipital neuralgia    On supplemental oxygen by nasal cannula    a.) 2L.[place] at bedtime and PRN   Palpitations    Pancytopenia (HCC)    Pleural effusion on left    a. 08/2021 s/p thoracentesis.   Pneumonia 2018   Polyneuropathy    Prediabetes    Renal cyst, left    a.) Renal artery [place] 12/26/2017: measured 2.1 cm.   Sleep difficulties    a.) takes trazodone + melatonin   Stroke ([place])    MRI 04/2008 + left sup. frontal gyrus possibly puntate infarct    Syncope 2019   Uses wheelchair    able to stand and self transfer   Vitamin B deficiency    Vitamin D deficiency     Past Surgical History: Past Surgical  History:  Procedure Laterality Date   ABDOMINAL HYSTERECTOMY     BLADDER SURGERY     2003   BREAST EXCISIONAL BIOPSY Right Over 20 years    Benign   CATARACT EXTRACTION W/PHACO Right 10/09/2022   Procedure: CATARACT EXTRACTION PHACO AND INTRAOCULAR LENS PLACEMENT ([place]) RIGHT  8.13  00:43.5;  Surgeon: Leandrew Koyanagi, MD;  Location: [place];  Service: Ophthalmology;  Laterality: Right;   CHOLECYSTECTOMY     CYSTOSCOPY W/ URETERAL STENT PLACEMENT Left 05/13/2022   Procedure: CYSTOSCOPY WITH RETROGRADE PYELOGRAM/URETERAL LEFT STENT PLACEMENT;  Surgeon: Hollice Espy, MD;  Location: [facility];  Service: Urology;  Laterality: Left;   CYSTOSCOPY WITH BIOPSY N/A 05/13/2022   Procedure: CYSTOSCOPY WITH BLADDER BIOPSY;  Surgeon: Hollice Espy, MD;  Location: [facility];  Service: Urology;   Laterality: N/A;   gastroplication      KNEE ARTHROSCOPY Left 2011   PULSE GENERATOR IMPLANT Left 01/31/2020   Procedure: PLACEMENT RIGHT FLANK PULSE GENERATOR VS REMOVAL SPINAL CORD STIMULATOR;  Surgeon: Deetta Perla, MD;  Location: [facility];  Service: Neurosurgery;  Laterality: Left;   PULSE GENERATOR IMPLANT Left 04/24/2020   Procedure: REPLACEMENT LEFT FLANK PULSE GENERATOR IMPLANT;  Surgeon: Deetta Perla, MD;  Location: [facility];  Service: Neurosurgery;  Laterality: Left;  MAC w/ local   REPLACEMENT TOTAL KNEE Left    right arm fracture     RIGHT/LEFT HEART CATH AND CORONARY ANGIOGRAPHY N/A 08/28/2021   Procedure: RIGHT/LEFT HEART CATH AND CORONARY ANGIOGRAPHY;  Surgeon: [place] Hampshire, MD;  Location: [facility];  Service: Cardiovascular;  Laterality: N/A;   SPINAL CORD STIMULATOR REMOVAL N/A 06/26/2020   Procedure: SPINAL CORD STIMULATOR REMOVAL;  Surgeon: Deetta Perla, MD;  Location: [facility];  Service: Neurosurgery;  Laterality: N/A;   THORACIC LAMINECTOMY FOR SPINAL CORD STIMULATOR N/A 01/24/2020   Procedure: THORACIC SPINAL CORD STIMULATOR PADDLE TRIAL VIA LAMINECTOMY;  Surgeon: Deetta Perla, MD;  Location: [facility];  Service: Neurosurgery;  Laterality: N/A;   TONSILLECTOMY AND ADENOIDECTOMY     TOTAL KNEE ARTHROPLASTY Left 04/17/2015   Procedure: LEFT TOTAL KNEE ARTHROPLASTY;  Surgeon: Paralee Cancel, MD;  Location: [facility];  Service: Orthopedics;  Laterality: Left;    Allergies: Allergies as of 10/22/2022 - Review Complete 10/22/2022  Allergen Reaction Noted   Vibegron Other (See Comments) 07/30/2022    Medications: Current Meds  Medication Sig   acetaminophen (TYLENOL) 500 MG tablet Take 650 mg by mouth every 8 (eight) hours as needed for moderate pain.   alendronate (FOSAMAX) 70 MG tablet Take 70 mg by mouth once a week.    ALPRAZolam (XANAX) 0.5 MG tablet Take 1 tablet (0.5 mg total) by mouth 2 (two) times daily as needed for anxiety.   aspirin EC 81 MG tablet  Take 1 tablet (81 mg total) by mouth at bedtime. Swallow whole.   atorvastatin (LIPITOR) 20 MG tablet Take 1 tablet (20 mg total) by mouth daily.   calcium-vitamin D (OSCAL WITH D) 500-5 MG-MCG tablet Take 1 tablet by mouth 2 (two) times daily.   carboxymethylcellulose 1 % ophthalmic solution Place 1 drop into both eyes at bedtime.   cetirizine (ZYRTEC) 5 MG tablet Take 5 mg by mouth daily.   Cholecalciferol (VITAMIN D3) 125 MCG (5000 UT) TABS Take 1 tablet (5,000 Units total) by mouth daily.   Cranberry 450 MG TABS Take 1 tablet by mouth every morning.   Docusate Calcium (STOOL SOFTENER PO) Take by mouth. 2 capsules 2 times daily  famotidine (PEPCID) 20 MG tablet Take 20 mg by mouth at bedtime.   HYDROcodone-acetaminophen (NORCO/VICODIN) 5-325 MG tablet Take 1 tablet by mouth every 6 (six) hours as needed for moderate pain.   hydrocortisone cream 1 % Apply 1 Application topically 2 (two) times daily as needed for itching.   levothyroxine (SYNTHROID) 100 MCG tablet Take 1 tablet (100 mcg total) by mouth daily before breakfast.   losartan (COZAAR) 25 MG tablet Take 0.5 tablets (12.5 mg total) by mouth daily.   melatonin 5 MG TABS Take 1 tablet (5 mg total) by mouth at bedtime.   midodrine (PROAMATINE) 5 MG tablet Take 1 tablet (5 mg total) by mouth 3 (three) times daily with meals.   montelukast (SINGULAIR) 10 MG tablet Take 1 tablet (10 mg total) by mouth at bedtime.   moxifloxacin (VIGAMOX) 0.5 % ophthalmic solution Place 1 drop into the right eye 4 (four) times daily.   nystatin powder Apply 1 Application topically every 6 (six) hours as needed (groin rash).   omeprazole (PRILOSEC) 40 MG capsule Take 1 capsule (40 mg total) by mouth daily. After lunch   ondansetron (ZOFRAN) 4 MG tablet Take 1 tablet (4 mg total) by mouth every 8 (eight) hours as needed for nausea or vomiting.   oxybutynin (DITROPAN) 5 MG tablet Take 1 tablet (5 mg total) by mouth every 8 (eight) hours as needed for bladder  spasms.   polyethylene glycol (MIRALAX / GLYCOLAX) 17 g packet Take 17 g by mouth daily.   potassium chloride SA (KLOR-CON M) 20 MEQ tablet Take 20 mEq by mouth 2 (two) times daily.   pregabalin (LYRICA) 150 MG capsule Take 150 mg by mouth at bedtime.   pregabalin (LYRICA) 75 MG capsule Take 75 mg by mouth every morning.   senna-docusate (SENOKOT-S) 8.6-50 MG tablet Take 2 tablets by mouth at bedtime.   simethicone (MYLICON) 80 MG chewable tablet Chew 80 mg by mouth every 6 (six) hours as needed for flatulence.   torsemide (DEMADEX) 20 MG tablet Take 1 tablet (20 mg total) by mouth daily.   traZODone (DESYREL) 100 MG tablet Take 100 mg by mouth at bedtime.   venlafaxine (EFFEXOR) 75 MG tablet Take 75 mg by mouth 2 (two) times daily.   White Petrolatum-Mineral Oil (ARTIFICIAL TEARS) ointment at bedtime.   [DISCONTINUED] POTASSIUM CHLORIDE ER PO Take 20 mEq by mouth in the morning and at bedtime.    Social History: Social History   Tobacco Use   Smoking status: Never    Passive exposure: Yes   Smokeless tobacco: Never   Tobacco comments:    husbands and children smoked in home.   Vaping Use   Vaping Use: Never used  Substance Use Topics   Alcohol use: No   Drug use: No    Family Medical History: Family History  Problem Relation Age of Onset   Heart disease Mother    Hypertension Mother    Diabetes Mother    Heart attack Mother 40   Heart disease Father    Heart attack Father 78   Breast cancer Maternal Aunt    Heart attack Brother     Physical Examination: Vitals:   10/23/22 0400 10/23/22 0600  BP: 99/76 95/75  Pulse: 88 93  Resp: 18 18  Temp:    SpO2: 95% 93%    General: Patient is well developed, well nourished, calm, collected, and in no apparent distress. Attention to examination is appropriate.  Neck:   Supple.  Full  range of motion.  Respiratory: Patient is breathing without any difficulty.   NEUROLOGICAL:     Awake, alert, oriented to person, place,  and time.  Speech is clear and fluent.  Cranial Nerves: Pupils equal round and reactive to light.  Facial tone is symmetric.  Facial sensation is symmetric. Shoulder shrug is symmetric. Tongue protrusion is midline.   ROM of spine: full.    Strength: Moves UE well.   Side Iliopsoas Quads Hamstring PF DF EHL  R 5 5 5 5 5 5   L 5 5 5 5 5 5     Bilateral upper and lower extremity sensation is intact to light touch.    No evidence of dysmetria noted.  Gait is untested.     Medical Decision Making  Imaging: CT TL spine 10/22/2022 IMPRESSION: 1. Interval T4-bilateral iliac spinal fusion with instrumentation as described above. No significant periprosthetic lucency or hardware fracture to suggest hardware failure. No definite incorporation of interbody bone graft at L5-S1. 2. Minimally displaced axially oriented acute to subacute fracture through the T7 vertebral body without significant loss of height or associated listhesis. Posterior elements appear intact. No canal hematoma. Mild surrounding paravertebral edema. 3. No acute fracture or listhesis of the lumbar spine. 4. Multilevel degenerative disc and degenerative joint disease resulting in mild to moderate right neuroforaminal narrowing at L1-L5 and left neuroforaminal narrowing at L4-5, most severe on the right at L1-2.     Electronically Signed   By: Fidela Salisbury M.D.   On: 10/22/2022 22:46  I have personally reviewed the images and agree with the above interpretation.  Assessment and Plan: Ms. Mcclave is a pleasant 77 y.o. female with new T7 fracture after a fall.  Her posterior elements appear to be intact.  Her implants appear to be intact.  To ensure that her rods not fractured, I have ordered thoracic spine x-rays.  If these x-rays show no evidence of instrumentation failure at the T7-8 level, she will be cleared to work with physical therapy and Occupational Therapy.  She is already instrumented at this level.   If her implants are in good position and have not fractured, she will not need further bracing as she is already internally braced.  Either way, we will arrange follow-up for her.  I have communicated my recommendations to the requesting physician and coordinated care to facilitate these recommendations.     Dorthula Bier K. Izora Ribas MD, [facility] Neurosurgery

## 2022-10-23 NOTE — Assessment & Plan Note (Signed)
2/2 recent fall. Pt already has h./o low back  surgery and hardware for her DDD . Prn Morphine.  Neuro check.

## 2022-10-23 NOTE — ED Provider Notes (Signed)
[facility] Provider Note    Event Date/Time   First MD Initiated Contact with Patient 10/22/22 2028     (approximate)   History   Fall   HPI  Kimberly Cook is a 77 y.o. female with a history of ESBL UTI, HFrEF, CVA, CKD stage IIIa, hypertension, hypothyroidism and prior spinal surgeries who presents with back pain after a fall 3 days ago.  The patient states that she fell while getting out of the shower when there was not a towel on the ground.  She hit her head as well as her lower back.  She states that since that time she has not had any significant headaches or dizziness although she does have some pain to the back of the head.  She has had severe low back pain that is not helped by hydrocodone that she takes for chronic pain.  She has been unable to walk or do any activities.  I reviewed the past medical records.  The patient was most recently admitted in October of last year.  Per the hospitalist discharge summary from 10/12 she was treated for acute cystitis along with vomiting and an elevated troponin.   Physical Exam   Triage Vital Signs: ED Triage Vitals  Enc Vitals Group     BP 10/22/22 1659 (!) 109/57     Pulse Rate 10/22/22 1659 77     Resp 10/22/22 1659 20     Temp 10/22/22 1659 98.4 F (36.9 C)     Temp Source 10/22/22 1659 Oral     SpO2 10/22/22 1659 98 %     Weight 10/22/22 1654 191 lb (86.6 kg)     Height 10/22/22 1654 5\' 4"  (1.626 m)     Head Circumference --      Peak Flow --      Pain Score 10/22/22 1654 10     Pain Loc --      Pain Edu? --      Excl. in [place]? --     Most recent vital signs: Vitals:   10/22/22 1659 10/22/22 2030  BP: (!) 109/57 122/88  Pulse: 77 77  Resp: 20 18  Temp: 98.4 F (36.9 C) 98.4 F (36.9 C)  SpO2: 98% 100%     General: Alert and oriented, no distress. CV:  Good peripheral perfusion.  Resp:  Normal effort.  Abd:  No distention.  Other:  EOMI.  PERRLA.  No facial droop.  Motor and  sensory intact in all extremities.  No pronator drift.  No ataxia on finger-to-nose.  No midline cervical spinal tenderness.  Mild right paraspinal cervical tenderness.  Lower thoracic/upper lumbar midline tenderness with no step-off or crepitus.   ED Results / Procedures / Treatments   Labs (all labs ordered are listed, but only abnormal results are displayed) Labs Reviewed  BASIC METABOLIC PANEL - Abnormal; Notable for the following components:      Result Value   Sodium 134 (*)    Potassium 3.1 (*)    Glucose, Bld 106 (*)    Creatinine, Ser 1.06 (*)    Calcium 8.8 (*)    GFR, Estimated 54 (*)    All other components within normal limits  CBC WITH DIFFERENTIAL/PLATELET - Abnormal; Notable for the following components:   Platelets 135 (*)    All other components within normal limits     EKG     RADIOLOGY  CT head: I independently viewed and interpreted images; there is  no ICH.  Radiology report indicates no acute abnormality. CT cervical spine: No acute fracture CT lumbar spine: No acute fracture CT thoracic spine:  Minimally displaced axially oriented acute to subacute fracture  through the T7 vertebral body without significant loss of height or  associated listhesis. Posterior elements appear intact. No canal  hematoma. Mild surrounding paravertebral edema.     PROCEDURES:  Critical Care performed: No  Procedures   MEDICATIONS ORDERED IN ED: Medications  oxyCODONE-acetaminophen (PERCOCET/ROXICET) 5-325 MG per tablet 1 tablet (1 tablet Oral Given 10/22/22 2240)  morphine (PF) 4 MG/ML injection 4 mg (4 mg Intravenous Given 10/22/22 2351)     IMPRESSION / MDM / ASSESSMENT AND PLAN / ED COURSE  I reviewed the triage vital signs and the nursing notes.  77 year old female with PMH as noted above presents with primary mid to low back pain after a fall several days ago with difficulty ambulating.  She also hit her head but has not had any significant headache or  dizziness.  Neurologic exam is nonfocal.  Differential diagnosis includes, but is not limited to, thoracic or lumbar spinal fracture, contusion, muscle strain or spasm.  Overall suspect minor head injury.  We will obtain CTs of the spine to rule out acute fracture and a CT of the head given the patient's age.  Patient's presentation is most consistent with acute presentation with potential threat to life or bodily function.  ----------------------------------------- 12:32 AM on 10/23/2022 -----------------------------------------  CTs show a T7 fracture with no significant hematoma or other complication.  I consulted and discussed case with Dr. Izora Ribas from neurosurgery who does not recommend any acute intervention and will see the patient in the morning.  Given her relatively significant pain and difficulty with ambulation and ADLs, the patient will need admission for pain control and rehab.  I consulted Dr. Posey Pronto from the hospitalist service; based on her discussion she agrees to admit the patient.   FINAL CLINICAL IMPRESSION(S) / ED DIAGNOSES   Final diagnoses:  Other closed fracture of seventh thoracic vertebra, initial encounter [facility])     Rx / DC Orders   ED Discharge Orders     None        Note:  This document was prepared using Dragon voice recognition software and may include unintentional dictation errors.    Arta Silence, MD 10/23/22 [phone]

## 2022-10-24 DIAGNOSIS — I272 Pulmonary hypertension, unspecified: Secondary | ICD-10-CM | POA: Diagnosis present

## 2022-10-24 DIAGNOSIS — I5022 Chronic systolic (congestive) heart failure: Secondary | ICD-10-CM | POA: Diagnosis present

## 2022-10-24 DIAGNOSIS — Z803 Family history of malignant neoplasm of breast: Secondary | ICD-10-CM | POA: Diagnosis not present

## 2022-10-24 DIAGNOSIS — W19XXXA Unspecified fall, initial encounter: Secondary | ICD-10-CM | POA: Diagnosis not present

## 2022-10-24 DIAGNOSIS — N1831 Chronic kidney disease, stage 3a: Secondary | ICD-10-CM | POA: Diagnosis present

## 2022-10-24 DIAGNOSIS — E039 Hypothyroidism, unspecified: Secondary | ICD-10-CM | POA: Diagnosis present

## 2022-10-24 DIAGNOSIS — D696 Thrombocytopenia, unspecified: Secondary | ICD-10-CM

## 2022-10-24 DIAGNOSIS — S22068A Other fracture of T7-T8 thoracic vertebra, initial encounter for closed fracture: Secondary | ICD-10-CM | POA: Diagnosis present

## 2022-10-24 DIAGNOSIS — Z8673 Personal history of transient ischemic attack (TIA), and cerebral infarction without residual deficits: Secondary | ICD-10-CM | POA: Diagnosis not present

## 2022-10-24 DIAGNOSIS — G8929 Other chronic pain: Secondary | ICD-10-CM | POA: Diagnosis present

## 2022-10-24 DIAGNOSIS — Z8249 Family history of ischemic heart disease and other diseases of the circulatory system: Secondary | ICD-10-CM | POA: Diagnosis not present

## 2022-10-24 DIAGNOSIS — I428 Other cardiomyopathies: Secondary | ICD-10-CM | POA: Diagnosis present

## 2022-10-24 DIAGNOSIS — Z8619 Personal history of other infectious and parasitic diseases: Secondary | ICD-10-CM | POA: Diagnosis not present

## 2022-10-24 DIAGNOSIS — E785 Hyperlipidemia, unspecified: Secondary | ICD-10-CM | POA: Diagnosis present

## 2022-10-24 DIAGNOSIS — K5900 Constipation, unspecified: Secondary | ICD-10-CM | POA: Diagnosis present

## 2022-10-24 DIAGNOSIS — S22069A Unspecified fracture of T7-T8 vertebra, initial encounter for closed fracture: Secondary | ICD-10-CM

## 2022-10-24 DIAGNOSIS — E876 Hypokalemia: Secondary | ICD-10-CM | POA: Diagnosis present

## 2022-10-24 DIAGNOSIS — I130 Hypertensive heart and chronic kidney disease with heart failure and stage 1 through stage 4 chronic kidney disease, or unspecified chronic kidney disease: Secondary | ICD-10-CM | POA: Diagnosis present

## 2022-10-24 DIAGNOSIS — I2510 Atherosclerotic heart disease of native coronary artery without angina pectoris: Secondary | ICD-10-CM | POA: Diagnosis present

## 2022-10-24 DIAGNOSIS — Z79890 Hormone replacement therapy (postmenopausal): Secondary | ICD-10-CM | POA: Diagnosis not present

## 2022-10-24 DIAGNOSIS — Y92121 Bathroom in nursing home as the place of occurrence of the external cause: Secondary | ICD-10-CM | POA: Diagnosis not present

## 2022-10-24 DIAGNOSIS — Y93E1 Activity, personal bathing and showering: Secondary | ICD-10-CM | POA: Diagnosis not present

## 2022-10-24 DIAGNOSIS — W182XXA Fall in (into) shower or empty bathtub, initial encounter: Secondary | ICD-10-CM | POA: Diagnosis present

## 2022-10-24 DIAGNOSIS — F32A Depression, unspecified: Secondary | ICD-10-CM | POA: Diagnosis present

## 2022-10-24 DIAGNOSIS — R55 Syncope and collapse: Secondary | ICD-10-CM | POA: Diagnosis present

## 2022-10-24 DIAGNOSIS — M5136 Other intervertebral disc degeneration, lumbar region: Secondary | ICD-10-CM | POA: Diagnosis present

## 2022-10-24 DIAGNOSIS — Z6833 Body mass index (BMI) 33.0-33.9, adult: Secondary | ICD-10-CM | POA: Diagnosis not present

## 2022-10-24 DIAGNOSIS — Z833 Family history of diabetes mellitus: Secondary | ICD-10-CM | POA: Diagnosis not present

## 2022-10-24 DIAGNOSIS — Z66 Do not resuscitate: Secondary | ICD-10-CM | POA: Diagnosis present

## 2022-10-24 LAB — BASIC METABOLIC PANEL
Anion gap: 11 (ref 5–15)
BUN: 8 mg/dL (ref 8–23)
CO2: 26 mmol/L (ref 22–32)
Calcium: 8.5 mg/dL — ABNORMAL LOW (ref 8.9–10.3)
Chloride: 102 mmol/L (ref 98–111)
Creatinine, Ser: 0.83 mg/dL (ref 0.44–1.00)
GFR, Estimated: >60 mL/min (ref >60)
Glucose, Bld: 104 mg/dL — ABNORMAL HIGH (ref 70–99)
Potassium: 3.3 mmol/L — ABNORMAL LOW (ref 3.5–5.1)
Sodium: 139 mmol/L (ref 135–145)

## 2022-10-24 MED ORDER — OXYCODONE-ACETAMINOPHEN 7.5-325 MG PO TABS
1.0000 | ORAL_TABLET | Freq: Four times a day (QID) | ORAL | Status: DC | PRN
  Administered 2022-10-25 – 2022-10-27 (×5): 1 via ORAL
  Filled 2022-10-24 (×5): qty 1

## 2022-10-24 MED ORDER — POTASSIUM CHLORIDE CRYS ER 20 MEQ PO TBCR
40.0000 meq | EXTENDED_RELEASE_TABLET | Freq: Once | ORAL | Status: AC
  Administered 2022-10-24: 40 meq via ORAL
  Filled 2022-10-24: qty 2

## 2022-10-24 NOTE — Evaluation (Signed)
Physical Therapy Evaluation Patient Details Name: Kimberly Cook MRN: [id number] DOB: 03/21/1946 Today's Date: 10/24/2022  History of Present Illness  Patient is a 77 year old female with complaint of thoracic back pain after fall on March 16. New T7 vertebral fracture. History of extensive T4 to pelvis instrumented fusion in 2022.  Clinical Impression  Patient is agreeable to PT. She reports she has lived at [facility] for 2 years now. She can ambulate short bouts with her rollator and has history of falls.  The patient is limited by pain. She required physical assistance for bed mobility, standing, and taking a few side steps along edge of bed using rolling walker. Orthostatic vitals taken during session with only mild dizziness reported initially with standing. Activity tolerance limited by fatigue and back pain. Recommend PT follow up to maximize independence and decrease caregiver burden. SNF is recommended at discharge.   Orthostatic VS taken during PT session:  BP- Lying Pulse- Lying BP- Sitting Pulse- Sitting BP- Standing at 0 minutes Pulse- Standing at 0 minutes  10/24/22 0915 101/53 85 122/90 97 112/77 115                  Recommendations for follow up therapy are one component of a multi-disciplinary discharge planning process, led by the attending physician.  Recommendations may be updated based on patient status, additional functional criteria and insurance authorization.  Follow Up Recommendations Skilled nursing-short term rehab (<3 hours/day) Can patient physically be transported by private vehicle: No    Assistance Recommended at Discharge Frequent or constant Supervision/Assistance  Patient can return home with the following  A lot of help with walking and/or transfers;A lot of help with bathing/dressing/bathroom;Help with stairs or ramp for entrance;Assist for transportation;Assistance with cooking/housework    Equipment Recommendations None recommended by PT   Recommendations for Other Services       Functional Status Assessment Patient has had a recent decline in their functional status and demonstrates the ability to make significant improvements in function in a reasonable and predictable amount of time.     Precautions / Restrictions Precautions Precautions: Fall (history of spine surgery) Restrictions Weight Bearing Restrictions: No      Mobility  Bed Mobility Overal bed mobility: Needs Assistance Bed Mobility: Supine to Sit, Sit to Supine     Supine to sit: Max assist Sit to supine: Max assist   General bed mobility comments: assistance for trunk and BLE support. logroll technique encouraged and used for bed mobility efforts    Transfers Overall transfer level: Needs assistance Equipment used: Rolling walker (2 wheels) Transfers: Sit to/from Stand Sit to Stand: Max assist           General transfer comment: verbal cues for hand placement and sequencing    Ambulation/Gait Ambulation/Gait assistance: Min assist Gait Distance (Feet): 2 Feet Assistive device: Rolling walker (2 wheels)   Gait velocity: decreased     General Gait Details: patient able to take several side steps along edge of bed with steadying assistance provided. cues for technique. further ambulation not attempted due to limited standing tolerance and pain  Stairs            Wheelchair Mobility    Modified Rankin (Stroke Patients Only)       Balance Overall balance assessment: Needs assistance Sitting-balance support: Feet supported Sitting balance-Leahy Scale: Good     Standing balance support: Bilateral upper extremity supported Standing balance-Leahy Scale: Poor Standing balance comment: using rolling walker for  support, external support required                             Pertinent Vitals/Pain Pain Assessment Pain Assessment: Faces Faces Pain Scale: Hurts even more Pain Location: mild to upper back on the  right Pain Descriptors / Indicators: Discomfort Pain Intervention(s): Limited activity within patient's tolerance, Monitored during session, Repositioned    Home Living Family/patient expects to be discharged to:: Skilled nursing facility                   Additional Comments: LTC at [facility], currently in restorative therapy    Prior Function Prior Level of Function : Needs assist;History of Falls (last six months)             Mobility Comments: short distance ambulation with rollator - can perform independently but has assistance if needed ADLs Comments: assistance required     Hand Dominance        Extremity/Trunk Assessment   Upper Extremity Assessment Upper Extremity Assessment: Generalized weakness    Lower Extremity Assessment Lower Extremity Assessment: Generalized weakness       Communication   Communication: No difficulties  Cognition Arousal/Alertness: Awake/alert Behavior During Therapy: WFL for tasks assessed/performed Overall Cognitive Status: Within Functional Limits for tasks assessed                                          General Comments      Exercises     Assessment/Plan    PT Assessment Patient needs continued PT services  PT Problem List Decreased strength;Decreased range of motion;Decreased activity tolerance;Decreased balance;Decreased mobility;Decreased safety awareness;Decreased knowledge of use of DME       PT Treatment Interventions DME instruction;Stair training;Gait training;Functional mobility training;Therapeutic activities;Therapeutic exercise;Balance training;Neuromuscular re-education;Patient/family education;Wheelchair mobility training    PT Goals (Current goals can be found in the Care Plan section)  Acute Rehab PT Goals Patient Stated Goal: to return to [facility] PT Goal Formulation: With patient Time For Goal Achievement: 11/07/22 Potential to Achieve Goals: Good    Frequency Min  2X/week     Co-evaluation               AM-PAC PT "6 Clicks" Mobility  Outcome Measure Help needed turning from your back to your side while in a flat bed without using bedrails?: A Lot Help needed moving from lying on your back to sitting on the side of a flat bed without using bedrails?: A Lot Help needed moving to and from a bed to a chair (including a wheelchair)?: A Lot Help needed standing up from a chair using your arms (e.g., wheelchair or bedside chair)?: A Lot Help needed to walk in hospital room?: A Lot Help needed climbing 3-5 steps with a railing? : A Lot 6 Click Score: 12    End of Session   Activity Tolerance: Patient tolerated treatment well;Patient limited by pain Patient left: in bed;with call bell/phone within reach;with chair alarm set   PT Visit Diagnosis: Muscle weakness (generalized) (M62.81);Unsteadiness on feet (R26.81);Pain Pain - Right/Left: Right Pain - part of body:  (back)    Time: [id number] PT Time Calculation (min) (ACUTE ONLY): 29 min   Charges:   PT Evaluation $PT Eval Low Complexity: 1 Low PT Treatments $Therapeutic Activity: 8-22 mins  Minna Merritts, PT, MPT   [name] 10/24/2022, 11:13 AM

## 2022-10-24 NOTE — Progress Notes (Signed)
PROGRESS NOTE    Kimberly Cook  [id number] DOB: 07/27/1946 DOA: 10/22/2022 PCP: System, Provider Not In   Assessment & Plan:   Principal Problem:   Falls, initial encounter Active Problems:   Back pain   T7 vertebral fracture (HCC)   HFrEF (heart failure with reduced ejection fraction) ([facility])   CKD Stage IIIa   Hypothyroidism   Coronary artery disease involving native coronary artery of native heart without angina pectoris  Assessment and Plan: Falls: etiology unclear, possibly generalized weakness vs mechanical fall. Actually fell getting out of the shower and there was no towel on the ground. PT/OT recs SNF  T7 vertebral fracture: no evidence of instrumentation failure at level T7-8 as per neuro surg. PT/OT recs SNF. Hx of chronic back pain & degenerative disc disease. Morphine, percocet prn for pain   Hypokalemia: KCl repleated  Thrombocytopenia: etiology unclear. Will continue to monitor   Chronic systolic CHF: appears euvolemic. Holding losartan, torsemide. Continue on aspirin, statin  HLD: continue on statin  Hypothyroidism: continue on levothyroxine   Hx of CAD: nonobstructive. Continue on statin, aspirin      DVT prophylaxis:  heparin SQ  Code Status: DNR Family Communication:  Disposition Plan:  possibly d/c to SNF  Status is: Inpatient Remains inpatient appropriate because: still w/ significant back pain    Level of care: Telemetry Cardiac Consultants:  Neuro surg   Procedures:    Antimicrobials:    Subjective: Pt c/o back pain still   Objective: Vitals:   10/23/22 2042 10/23/22 2346 10/24/22 0351 10/24/22 0500  BP: 133/80 120/68 102/77   Pulse: 70 89 91   Resp: 20 20 18    Temp: 98.2 F (36.8 C) 97.8 F (36.6 C) 97.6 F (36.4 C)   TempSrc:  Oral    SpO2: 99% 97% 97%   Weight:    86.7 kg  Height:        Intake/Output Summary (Last 24 hours) at 10/24/2022 0826 Last data filed at 10/24/2022 0537 Gross per 24 hour  Intake  925.66 ml  Output 550 ml  Net 375.66 ml   Filed Weights   10/22/22 1654 10/24/22 0500  Weight: 86.6 kg 86.7 kg    Examination:  General exam: appears uncomfortable  Respiratory system: clear breath sounds b/l Cardiovascular system: S1/S2+. No rubs or clicks  Gastrointestinal system: Abd is soft, NT, obese & normal bowel sounds. Central nervous system: Alert and oriented. Moves all extremities  Psychiatry: judgement and insight appear normal. Flat mood and affect    Data Reviewed: I have personally reviewed following labs and imaging studies  CBC: Recent Labs  Lab 10/22/22 2349 10/23/22 0330  WBC 4.9 5.3  NEUTROABS 3.5  --   HGB 13.0 12.4  HCT 37.0 35.9*  MCV 89.4 90.2  PLT 135* [id number]*   Basic Metabolic Panel: Recent Labs  Lab 10/22/22 2349 10/23/22 0330 10/24/22 0600  NA 134* 135 139  K 3.1* 3.5 3.3*  CL 99 102 102  CO2 23 24 26   GLUCOSE 106* 111* 104*  BUN 10 12 8   CREATININE 1.06* 1.07* 0.83  CALCIUM 8.8* 8.6* 8.5*   GFR: Estimated Creatinine Clearance: 59.5 mL/min (by C-G formula based on SCr of 0.83 mg/dL). Liver Function Tests: Recent Labs  Lab 10/23/22 0330  AST 21  ALT 15  ALKPHOS 58  BILITOT 0.7  PROT 6.8  ALBUMIN 3.5   No results for input(s): "LIPASE", "AMYLASE" in the last 168 hours. No results for input(s): "AMMONIA" in the  last 168 hours. Coagulation Profile: No results for input(s): "INR", "PROTIME" in the last 168 hours. Cardiac Enzymes: Recent Labs  Lab 10/23/22 0330  CKTOTAL 37*   BNP (last 3 results) No results for input(s): "PROBNP" in the last 8760 hours. HbA1C: No results for input(s): "HGBA1C" in the last 72 hours. CBG: No results for input(s): "GLUCAP" in the last 168 hours. Lipid Profile: No results for input(s): "CHOL", "HDL", "LDLCALC", "TRIG", "CHOLHDL", "LDLDIRECT" in the last 72 hours. Thyroid Function Tests: No results for input(s): "TSH", "T4TOTAL", "FREET4", "T3FREE", "THYROIDAB" in the last 72  hours. Anemia Panel: No results for input(s): "VITAMINB12", "FOLATE", "FERRITIN", "TIBC", "IRON", "RETICCTPCT" in the last 72 hours. Sepsis Labs: No results for input(s): "PROCALCITON", "LATICACIDVEN" in the last 168 hours.  No results found for this or any previous visit (from the past 240 hour(s)).       Radiology Studies: DG Thoracic Spine 2 View  Result Date: 10/23/2022 CLINICAL DATA:  Close T7 fracture. EXAM: THORACIC SPINE 3 VIEWS with patient is sitting COMPARISON:  X-ray 04/14/2022.  CT 10/22/2022 FINDINGS: Extensive fixation hardware identified with pedicle screws and vertical fixation rods seen extending from T4 throughout the thoracic spine and into the upper lumbar spine. Expected hardware placement without evidence of failure or loosening. Scattered multilevel disc height loss with some osteophytes diffusely along the spine. No listhesis. Osteopenia. Overall preserved vertebral body height. T7 fracture noted by CT scan is less appreciated by x-ray. Overall imaging obtained to aid in treatment. IMPRESSION: Osteopenia with degenerative changes with the extensive hardware fixation with pedicle screws and vertical fixation rods from T4 through L1. The fracture of T7 described on the prior CT is less appreciated on this x-ray. No listhesis. Electronically Signed   By: Jill Side M.D.   On: 10/23/2022 10:15   CT Lumbar Spine Wo Contrast  Result Date: 10/22/2022 CLINICAL DATA:  Back trauma, no prior imaging (Age >= 16y).  Fall EXAM: CT THORACIC AND LUMBAR SPINE WITHOUT CONTRAST TECHNIQUE: Multidetector CT imaging of the thoracic and lumbar spine was performed without contrast. Multiplanar CT image reconstructions were also generated. RADIATION DOSE REDUCTION: This exam was performed according to the departmental dose-optimization program which includes automated exposure control, adjustment of the mA and/or kV according to patient size and/or use of iterative reconstruction technique.  COMPARISON:  11/03/2019 FINDINGS: CT THORACIC SPINE FINDINGS Alignment: Thoracolumbar scoliosis, apex left at T12 appears stable. Interval T4-bilateral iliac spinal fusion with bilateral pedicle screws at T4-L1 with posterior bars. Accentuated thoracic kyphosis. No listhesis. Vertebrae: Osseous structures are diffusely osteopenic. There is a minimally displaced axially oriented acute to subacute fracture through the T7 vertebral body just superior to the inferior endplate which extends through the anterior and posterior walls of the vertebral body without significant loss of height. There is gas along the fracture plane noted. The posterior elements appear intact. No other fracture identified. Remaining vertebral body height is preserved. No significant periprosthetic lucency. No hardware fracture. Paraspinal and other soft tissues: There is mild paravertebral infiltration surrounding the T7 vertebral body in keeping with a small amount of surrounding interstitial hemorrhage or edema. No canal hematoma. The paraspinal soft tissues are otherwise unremarkable. Incidentally noted is moderate coronary artery calcification. Surgical changes of gastric bypass and cholecystectomy noted. Disc levels: There is intervertebral disc space narrowing and endplate remodeling throughout thoracic spine in keeping with changes of diffuse degenerative disc disease. No high-grade canal stenosis or neuroforaminal narrowing. CT LUMBAR SPINE FINDINGS Segmentation: 5 lumbar type vertebrae. Alignment:  Interval thoracic-bi iliac fusion with bilateral pedicle screws, posterior bars, interbody bone cages, and bilateral iliac screws identified. Normal lumbar lordosis. Stable mild lumbar levoscoliosis, apex left at L3. No listhesis. Vertebrae: Osseous structures are diffusely osteopenic. No acute fracture. Vertebral body height is preserved. No significant periprosthetic lucency or hardware fracture to suggest hardware failure. No definite  incorporation of interbody bone graft at L5-S1. Paraspinal and other soft tissues: Negative. Disc levels: Facet hypertrophy in combination with disc height loss contributes to mild to moderate right neuroforaminal narrowing at L1-L5 and left neuroforaminal narrowing at L4-5, most severe on the right at L1-2. No high-grade canal stenosis. IMPRESSION: 1. Interval T4-bilateral iliac spinal fusion with instrumentation as described above. No significant periprosthetic lucency or hardware fracture to suggest hardware failure. No definite incorporation of interbody bone graft at L5-S1. 2. Minimally displaced axially oriented acute to subacute fracture through the T7 vertebral body without significant loss of height or associated listhesis. Posterior elements appear intact. No canal hematoma. Mild surrounding paravertebral edema. 3. No acute fracture or listhesis of the lumbar spine. 4. Multilevel degenerative disc and degenerative joint disease resulting in mild to moderate right neuroforaminal narrowing at L1-L5 and left neuroforaminal narrowing at L4-5, most severe on the right at L1-2. Electronically Signed   By: Fidela Salisbury M.D.   On: 10/22/2022 22:46   CT Thoracic Spine Wo Contrast  Result Date: 10/22/2022 CLINICAL DATA:  Back trauma, no prior imaging (Age >= 16y).  Fall EXAM: CT THORACIC AND LUMBAR SPINE WITHOUT CONTRAST TECHNIQUE: Multidetector CT imaging of the thoracic and lumbar spine was performed without contrast. Multiplanar CT image reconstructions were also generated. RADIATION DOSE REDUCTION: This exam was performed according to the departmental dose-optimization program which includes automated exposure control, adjustment of the mA and/or kV according to patient size and/or use of iterative reconstruction technique. COMPARISON:  11/03/2019 FINDINGS: CT THORACIC SPINE FINDINGS Alignment: Thoracolumbar scoliosis, apex left at T12 appears stable. Interval T4-bilateral iliac spinal fusion with  bilateral pedicle screws at T4-L1 with posterior bars. Accentuated thoracic kyphosis. No listhesis. Vertebrae: Osseous structures are diffusely osteopenic. There is a minimally displaced axially oriented acute to subacute fracture through the T7 vertebral body just superior to the inferior endplate which extends through the anterior and posterior walls of the vertebral body without significant loss of height. There is gas along the fracture plane noted. The posterior elements appear intact. No other fracture identified. Remaining vertebral body height is preserved. No significant periprosthetic lucency. No hardware fracture. Paraspinal and other soft tissues: There is mild paravertebral infiltration surrounding the T7 vertebral body in keeping with a small amount of surrounding interstitial hemorrhage or edema. No canal hematoma. The paraspinal soft tissues are otherwise unremarkable. Incidentally noted is moderate coronary artery calcification. Surgical changes of gastric bypass and cholecystectomy noted. Disc levels: There is intervertebral disc space narrowing and endplate remodeling throughout thoracic spine in keeping with changes of diffuse degenerative disc disease. No high-grade canal stenosis or neuroforaminal narrowing. CT LUMBAR SPINE FINDINGS Segmentation: 5 lumbar type vertebrae. Alignment: Interval thoracic-bi iliac fusion with bilateral pedicle screws, posterior bars, interbody bone cages, and bilateral iliac screws identified. Normal lumbar lordosis. Stable mild lumbar levoscoliosis, apex left at L3. No listhesis. Vertebrae: Osseous structures are diffusely osteopenic. No acute fracture. Vertebral body height is preserved. No significant periprosthetic lucency or hardware fracture to suggest hardware failure. No definite incorporation of interbody bone graft at L5-S1. Paraspinal and other soft tissues: Negative. Disc levels: Facet hypertrophy in combination with disc height  loss contributes to mild  to moderate right neuroforaminal narrowing at L1-L5 and left neuroforaminal narrowing at L4-5, most severe on the right at L1-2. No high-grade canal stenosis. IMPRESSION: 1. Interval T4-bilateral iliac spinal fusion with instrumentation as described above. No significant periprosthetic lucency or hardware fracture to suggest hardware failure. No definite incorporation of interbody bone graft at L5-S1. 2. Minimally displaced axially oriented acute to subacute fracture through the T7 vertebral body without significant loss of height or associated listhesis. Posterior elements appear intact. No canal hematoma. Mild surrounding paravertebral edema. 3. No acute fracture or listhesis of the lumbar spine. 4. Multilevel degenerative disc and degenerative joint disease resulting in mild to moderate right neuroforaminal narrowing at L1-L5 and left neuroforaminal narrowing at L4-5, most severe on the right at L1-2. Electronically Signed   By: Fidela Salisbury M.D.   On: 10/22/2022 22:46   CT Head Wo Contrast  Result Date: 10/22/2022 CLINICAL DATA:  Trauma. EXAM: CT HEAD WITHOUT CONTRAST CT CERVICAL SPINE WITHOUT CONTRAST TECHNIQUE: Multidetector CT imaging of the head and cervical spine was performed following the standard protocol without intravenous contrast. Multiplanar CT image reconstructions of the cervical spine were also generated. RADIATION DOSE REDUCTION: This exam was performed according to the departmental dose-optimization program which includes automated exposure control, adjustment of the mA and/or kV according to patient size and/or use of iterative reconstruction technique. COMPARISON:  None Available. FINDINGS: CT HEAD FINDINGS Brain: Mild age-related atrophy and chronic microvascular ischemic changes. There is no acute intracranial hemorrhage. No mass effect or midline shift. No extra-axial fluid collection. Vascular: No hyperdense vessel or unexpected calcification. Skull: Normal. Negative for fracture or  focal lesion. Sinuses/Orbits: No acute finding. Other: None CT CERVICAL SPINE FINDINGS Alignment: No acute subluxation. Skull base and vertebrae: No acute fracture.  Osteopenia. Soft tissues and spinal canal: No prevertebral fluid or swelling. No visible canal hematoma. Disc levels:  No acute findings.  Degenerative changes. Upper chest: Negative. Other: None IMPRESSION: 1. No acute intracranial pathology. Mild age-related atrophy and chronic microvascular ischemic changes. 2. No acute/traumatic cervical spine pathology. Electronically Signed   By: Anner Crete M.D.   On: 10/22/2022 22:30   CT Cervical Spine Wo Contrast  Result Date: 10/22/2022 CLINICAL DATA:  Trauma. EXAM: CT HEAD WITHOUT CONTRAST CT CERVICAL SPINE WITHOUT CONTRAST TECHNIQUE: Multidetector CT imaging of the head and cervical spine was performed following the standard protocol without intravenous contrast. Multiplanar CT image reconstructions of the cervical spine were also generated. RADIATION DOSE REDUCTION: This exam was performed according to the departmental dose-optimization program which includes automated exposure control, adjustment of the mA and/or kV according to patient size and/or use of iterative reconstruction technique. COMPARISON:  None Available. FINDINGS: CT HEAD FINDINGS Brain: Mild age-related atrophy and chronic microvascular ischemic changes. There is no acute intracranial hemorrhage. No mass effect or midline shift. No extra-axial fluid collection. Vascular: No hyperdense vessel or unexpected calcification. Skull: Normal. Negative for fracture or focal lesion. Sinuses/Orbits: No acute finding. Other: None CT CERVICAL SPINE FINDINGS Alignment: No acute subluxation. Skull base and vertebrae: No acute fracture.  Osteopenia. Soft tissues and spinal canal: No prevertebral fluid or swelling. No visible canal hematoma. Disc levels:  No acute findings.  Degenerative changes. Upper chest: Negative. Other: None IMPRESSION: 1.  No acute intracranial pathology. Mild age-related atrophy and chronic microvascular ischemic changes. 2. No acute/traumatic cervical spine pathology. Electronically Signed   By: Anner Crete M.D.   On: 10/22/2022 22:30  Scheduled Meds:  aspirin EC  81 mg Oral QHS   atorvastatin  20 mg Oral QHS   heparin  5,000 Units Subcutaneous Q12H   levothyroxine  100 mcg Oral Q0600   midodrine  5 mg Oral TID WC   montelukast  10 mg Oral QHS   multivitamin with minerals  1 tablet Oral Daily   pantoprazole  40 mg Oral BID   pregabalin  150 mg Oral QHS   pregabalin  75 mg Oral Daily   sodium chloride flush  3 mL Intravenous Q12H   traZODone  100 mg Oral QHS   venlafaxine  75 mg Oral BID   Continuous Infusions:     LOS: 0 days    Time spent: 35 mins    Wyvonnia Dusky, MD [facility] Pager [phone]  If 7PM-7AM, please contact night-coverage [email or web address] 10/24/2022, 8:26 AM

## 2022-10-24 NOTE — Evaluation (Signed)
Occupational Therapy Evaluation Patient Details Name: Kimberly Cook MRN: [id number] DOB: 1945-10-09 Today's Date: 10/24/2022   History of Present Illness Patient is a 77 year old female with complaint of thoracic back pain after fall on March 16. New T7 vertebral fracture. History of extensive T4 to pelvis instrumented fusion in 2022.   Clinical Impression   Patient presenting with decreased Ind in self care,balance, functional mobility/transfers,endurance, and safety awareness. Patient [facility] at at [facility] and ambulating short distance to get into wheelchair and using wheelchair for mobility. Pt needing assistance for LB self care from staff at baseline. OT reviewed back precautions for comfort. Pt performing log roll in bed with mod - max A. Pt declines OOB transfers. Pt performing grooming tasks with set up A. Pt has had multiple falls at facility per her report and is very fearful. Patient will benefit from acute OT to increase overall independence in the areas of ADLs, functional mobility, and safety awareness in order to safely discharge to next venue of care.      Recommendations for follow up therapy are one component of a multi-disciplinary discharge planning process, led by the attending physician.  Recommendations may be updated based on patient status, additional functional criteria and insurance authorization.   Follow Up Recommendations  Skilled nursing-short term rehab (<3 hours/day)     Assistance Recommended at Discharge Intermittent Supervision/Assistance  Patient can return home with the following A lot of help with walking and/or transfers;A lot of help with bathing/dressing/bathroom;Assistance with cooking/housework;Assist for transportation;Help with stairs or ramp for entrance    Functional Status Assessment  Patient has had a recent decline in their functional status and demonstrates the ability to make significant improvements in function in a  reasonable and predictable amount of time.  Equipment Recommendations  None recommended by OT    Recommendations for Other Services       Precautions / Restrictions Precautions Precautions: Fall Precaution Comments: back precautions for comfort Restrictions Weight Bearing Restrictions: No      Mobility Bed Mobility Overal bed mobility: Needs Assistance Bed Mobility: Rolling, Supine to Sit, Sit to Supine Rolling: Mod assist   Supine to sit: Max assist Sit to supine: Max assist        Transfers                          Balance Overall balance assessment: Needs assistance Sitting-balance support: Feet supported Sitting balance-Leahy Scale: Good                                     ADL either performed or assessed with clinical judgement   ADL Overall ADL's : Needs assistance/impaired     Grooming: Wash/dry hands;Wash/dry face;Set up                                 General ADL Comments: pt declines attempts for functional transfer     Vision Patient Visual Report: No change from baseline              Pertinent Vitals/Pain Pain Assessment Pain Assessment: Faces Faces Pain Scale: Hurts whole lot Pain Location: mid back Pain Descriptors / Indicators: Discomfort, Guarding Pain Intervention(s): Limited activity within patient's tolerance, Monitored during session, Repositioned     Hand Dominance Right   Extremity/Trunk Assessment  Upper Extremity Assessment Upper Extremity Assessment: Generalized weakness   Lower Extremity Assessment Lower Extremity Assessment: Generalized weakness       Communication Communication Communication: No difficulties   Cognition Arousal/Alertness: Awake/alert Behavior During Therapy: WFL for tasks assessed/performed Overall Cognitive Status: Within Functional Limits for tasks assessed                                                  Home Living  Family/patient expects to be discharged to:: Skilled nursing facility                                 Additional Comments: LTC at [facility], currently in restorative therapy      Prior Functioning/Environment Prior Level of Function : Needs assist;History of Falls (last six months)             Mobility Comments: short distance ambulation with rollator - can perform independently but has assistance if needed ADLs Comments: Pt reports standing and taking steps to wheelchair but primarily using it to get around. Pt needs staff assistance for LB self care.        OT Problem List: Decreased strength;Decreased activity tolerance;Decreased safety awareness;Impaired balance (sitting and/or standing);Decreased knowledge of use of DME or AE;Decreased knowledge of precautions;Pain      OT Treatment/Interventions: Self-care/ADL training;Therapeutic exercise;Therapeutic activities;Energy conservation;DME and/or AE instruction;Patient/family education;Balance training    OT Goals(Current goals can be found in the care plan section) Acute Rehab OT Goals Patient Stated Goal: to decrease pain OT Goal Formulation: With patient Time For Goal Achievement: 11/07/22 Potential to Achieve Goals: Fair ADL Goals Pt Will Perform Grooming: with supervision;standing Pt Will Perform Lower Body Dressing: with min assist;sit to/from stand Pt Will Transfer to Toilet: with min assist;ambulating Pt Will Perform Toileting - Clothing Manipulation and hygiene: with min assist;sit to/from stand  OT Frequency: Min 2X/week       AM-PAC OT "6 Clicks" Daily Activity     Outcome Measure Help from another person eating meals?: None Help from another person taking care of personal grooming?: None Help from another person toileting, which includes using toliet, bedpan, or urinal?: A Lot Help from another person bathing (including washing, rinsing, drying)?: A Lot Help from another person to put on  and taking off regular upper body clothing?: A Little Help from another person to put on and taking off regular lower body clothing?: A Lot 6 Click Score: 17   End of Session Equipment Utilized During Treatment: Rolling walker (2 wheels) Nurse Communication: Mobility status  Activity Tolerance: Patient tolerated treatment well;Patient limited by pain Patient left: in bed;with call bell/phone within reach;with bed alarm set  OT Visit Diagnosis: Unsteadiness on feet (R26.81);Repeated falls (R29.6);Muscle weakness (generalized) (M62.81);History of falling (Z91.81)                Time: [id number] OT Time Calculation (min): 19 min Charges:  OT General Charges $OT Visit: 1 Visit OT Evaluation $OT Eval Moderate Complexity: 1 Mod OT Treatments $Therapeutic Activity: 8-22 mins  Darleen Crocker, MS, OTR/L , CBIS ascom [phone]  10/24/22, 1:42 PM

## 2022-10-25 DIAGNOSIS — S22069A Unspecified fracture of T7-T8 vertebra, initial encounter for closed fracture: Secondary | ICD-10-CM | POA: Diagnosis not present

## 2022-10-25 DIAGNOSIS — D696 Thrombocytopenia, unspecified: Secondary | ICD-10-CM | POA: Diagnosis not present

## 2022-10-25 DIAGNOSIS — W19XXXA Unspecified fall, initial encounter: Secondary | ICD-10-CM | POA: Diagnosis not present

## 2022-10-25 LAB — BASIC METABOLIC PANEL
Anion gap: 12 (ref 5–15)
BUN: 8 mg/dL (ref 8–23)
CO2: 27 mmol/L (ref 22–32)
Calcium: 9.4 mg/dL (ref 8.9–10.3)
Chloride: 104 mmol/L (ref 98–111)
Creatinine, Ser: 0.87 mg/dL (ref 0.44–1.00)
GFR, Estimated: >60 mL/min (ref >60)
Glucose, Bld: 107 mg/dL — ABNORMAL HIGH (ref 70–99)
Potassium: 4.1 mmol/L (ref 3.5–5.1)
Sodium: 143 mmol/L (ref 135–145)

## 2022-10-25 MED ORDER — POLYETHYLENE GLYCOL 3350 17 G PO PACK
17.0000 g | PACK | Freq: Every day | ORAL | Status: DC
  Administered 2022-10-25 – 2022-10-27 (×3): 17 g via ORAL
  Filled 2022-10-25 (×4): qty 1

## 2022-10-25 MED ORDER — MORPHINE SULFATE (PF) 2 MG/ML IV SOLN
2.0000 mg | INTRAVENOUS | Status: DC | PRN
  Administered 2022-10-25: 2 mg via INTRAVENOUS
  Filled 2022-10-25 (×2): qty 1

## 2022-10-25 MED ORDER — DOCUSATE SODIUM 100 MG PO CAPS
200.0000 mg | ORAL_CAPSULE | Freq: Two times a day (BID) | ORAL | Status: DC
  Administered 2022-10-25 – 2022-10-27 (×5): 200 mg via ORAL
  Filled 2022-10-25 (×7): qty 2

## 2022-10-25 NOTE — TOC Initial Note (Signed)
Transition of Care [facility])) - Initial/Assessment Note    Patient Details  Name: Kimberly Cook MRN: [id number] Date of Birth: [birth year]  Transition of Care [facility]) CM/SW Contact:    Laurena Slimmer, RN Phone Number: 10/25/2022, 2:01 PM  Clinical Narrative:                 Patient is from [facility] for [place].  Per MD patient is stable to return today Call placed to [facility] at [facility] to determine if Josem Kaufmann will be required prior to patient's return.  No answer. Unable to leave a VM         Patient Goals and CMS Choice            Expected Discharge Plan and Services                                              Prior Living Arrangements/Services                       Activities of Daily Living Home Assistive Devices/Equipment: Gilford Rile (specify type) ADL Screening (condition at time of admission) Patient's cognitive ability adequate to safely complete daily activities?: Yes Is the patient deaf or have difficulty hearing?: No Does the patient have difficulty seeing, even when wearing glasses/contacts?: No Does the patient have difficulty concentrating, remembering, or making decisions?: No Patient able to express need for assistance with ADLs?: Yes Does the patient have difficulty dressing or bathing?: Yes Independently performs ADLs?: No Communication: Independent Dressing (OT): Needs assistance Is this a change from baseline?: Pre-admission baseline Grooming: Needs assistance Is this a change from baseline?: Pre-admission baseline Feeding: Independent Bathing: Needs assistance Is this a change from baseline?: Pre-admission baseline Toileting: Needs assistance Is this a change from baseline?: Pre-admission baseline In/Out Bed: Needs assistance Is this a change from baseline?: Pre-admission baseline Walks in Home: Needs assistance Is this a change from baseline?: Pre-admission baseline Does the patient have difficulty walking or climbing stairs?: Yes Weakness  of Legs: Both Weakness of Arms/Hands: Both  Permission Sought/Granted                  Emotional Assessment              Admission diagnosis:  Fall [W19.XXXA] Other closed fracture of seventh thoracic vertebra, initial encounter ([place]) [S22.068A] Closed T7 spinal fracture ([place]) [S22.069A] Patient Active Problem List   Diagnosis Date Noted   Closed T7 spinal fracture ([place]) 10/24/2022   T7 vertebral fracture ([place]) 10/23/2022   Cystitis 05/15/2022   Acute cystitis 05/14/2022   Emesis 05/14/2022   Elevated troponin 05/14/2022   Wound infection 04/17/2022   Acquired scoliosis    Hypotension 04/13/2022   Post-concussion headache 04/13/2022   HFrEF (heart failure with reduced ejection fraction) ([place]) 04/11/2022   Peripheral neuropathy 04/11/2022   Hypokalemia 04/11/2022   Falls, initial encounter    Acute renal failure superimposed on stage 3a chronic kidney disease ([place]) 03/04/2022   UTI (urinary tract infection) [id number]   Toxic metabolic encephalopathy [id number]   Obesity (BMI 30-39.9) 03/02/2022   Exercise intolerance 03/02/2022   Left leg numbness 03/02/2022   Pericardial effusion 01/04/2022   History of stroke 08/29/2021   Neuropathy 08/29/2021   NICM (nonischemic cardiomyopathy) (HCC)    Generalized weakness    Bilateral pleural effusion    Chest pain  Pancytopenia ([place])    Acute on chronic HFrEF (heart failure with reduced ejection fraction) (HCC)    Malnutrition of moderate degree [id number]   Metabolic encephalopathy [id number]   Failed spinal cord stimulator, subsequent encounter 08/08/2020   S/P insertion of spinal cord stimulator 04/25/2020   Lumbar radiculopathy 12/07/2019   Lumbar facet arthropathy 12/07/2019   Spinal stenosis, lumbar region, with neurogenic claudication 12/07/2019   Osteoporosis 11/24/2019   Lumbar herniated disc 06/09/2019   Lumbar degenerative disc disease 06/09/2019   Spinal stenosis of cervical region 05/07/2019    Lumbar spondylosis 05/07/2019   Recurrent UTI 05/07/2019   Abnormal MRI, lumbar spine 02/18/2019   Abnormal gait 02/18/2019   Back pain 02/18/2019   Osteoarthritis 10/09/2018   Overactive bladder 10/09/2018   Chronic midline low back pain 04/22/2018   Chronic pain of left knee 04/22/2018   Toe fracture, left 03/10/2018   Lightheadedness 01/21/2018   PAC (premature atrial contraction) 01/19/2018   PVC's (premature ventricular contractions) 01/19/2018   Hernia, paraesophageal 12/16/2017   Kidney cysts 12/16/2017   CKD Stage IIIa 12/16/2017   Carotid artery stenosis 12/16/2017   Chronic pain 12/16/2017   Osteopenia 12/16/2017   DDD (degenerative disc disease), cervical 12/16/2017   Labile hypertension 09/05/2017   Anxiety and depression 08/27/2017   Insomnia 08/27/2017   Syncope 08/27/2017   Bilateral occipital neuralgia 08/27/2017   Lung nodule 06/30/2017   Coronary artery disease involving native coronary artery of native heart without angina pectoris 06/03/2017   SOB (shortness of breath) 04/11/2017   Labile blood pressure 03/08/2017   Syncope, near 03/08/2017   Palpitations 03/08/2017   Orthostatic lightheadedness 03/08/2017   S/P TKR (total knee replacement) 04/17/2015   Morbid obesity ([place]) 09/21/2014   Medication management 09/21/2014   Hypothyroidism 02/17/2014   Hyperlipidemia    GERD (gastroesophageal reflux disease)    Vitamin D deficiency    Depression    PCP:  System, Provider Not In Pharmacy:   [place], [place] [address] Phone: [phone] Fax: [phone]  [place], [place] [address] Phone: [phone] Fax: [phone]     Social Determinants of Health (SDOH) Social History: SDOH Screenings   Food Insecurity: No Food Insecurity (10/23/2022)  Housing: Low Risk  (10/23/2022)   Transportation Needs: No Transportation Needs (10/23/2022)  Utilities: Not At Risk (10/23/2022)  Depression (PHQ2-9): Low Risk  (03/28/2022)  Financial Resource Strain: Low Risk  (07/24/2020)  Physical Activity: Unknown (07/24/2020)  Social Connections: Unknown (07/24/2020)  Stress: No Stress Concern Present (07/24/2020)  Tobacco Use: Medium Risk (10/22/2022)   SDOH Interventions:     Readmission Risk Interventions     No data to display

## 2022-10-25 NOTE — Progress Notes (Signed)
Physical Therapy Treatment Patient Details Name: Kimberly Cook MRN: [id number] DOB: 10-20-1945 Today's Date: 10/25/2022   History of Present Illness Patient is a 77 year old female with complaint of thoracic back pain after fall on March 16. Pt diagnosed with new T7 vertebral fracture. PMH includes extensive T4 to pelvis instrumented fusion in 2022, CHF, CKD, depression, nonischemic cardiomyopathy, stroke, and L TKA.    PT Comments    Pt was pleasant and motivated to participate during the session and put forth good effort throughout. Pt required mod A for BLE and trunk control during log roll training as well as to come to standing from an elevated EOB.  Pt required occasional min A to prevent posterior LOB while standing and ambulated a max of 6 feet before becoming fatigued and needing to return to sitting.  Pt reported only mild dizziness during the session that did not worsen with position with SpO2 and HR WNL on room air.  Pt will benefit from PT services in a SNF setting upon discharge to safely address deficits listed in patient problem list for decreased caregiver assistance and eventual return to PLOF.     Recommendations for follow up therapy are one component of a multi-disciplinary discharge planning process, led by the attending physician.  Recommendations may be updated based on patient status, additional functional criteria and insurance authorization.  Follow Up Recommendations  Skilled nursing-short term rehab (<3 hours/day) Can patient physically be transported by private vehicle: No   Assistance Recommended at Discharge Frequent or constant Supervision/Assistance  Patient can return home with the following A lot of help with walking and/or transfers;A lot of help with bathing/dressing/bathroom;Help with stairs or ramp for entrance;Assist for transportation;Assistance with cooking/housework   Equipment Recommendations  None recommended by PT    Recommendations for  Other Services       Precautions / Restrictions Precautions Precautions: Fall Precaution Comments: back precautions for comfort Restrictions Weight Bearing Restrictions: No     Mobility  Bed Mobility Overal bed mobility: Needs Assistance Bed Mobility: Rolling, Supine to Sit, Sit to Supine Rolling: Min assist   Supine to sit: Mod assist Sit to supine: Mod assist   General bed mobility comments: Min verbal cues for sequencing during log roll training with mod A for BLE and trunk control    Transfers Overall transfer level: Needs assistance Equipment used: Rolling walker (2 wheels) Transfers: Sit to/from Stand Sit to Stand: Mod assist, From elevated surface           General transfer comment: verbal cues for hand placement and sequencing    Ambulation/Gait Ambulation/Gait assistance: Min assist Gait Distance (Feet): 6 Feet Assistive device: Rolling walker (2 wheels) Gait Pattern/deviations: Step-through pattern, Decreased step length - right, Decreased step length - left Gait velocity: decreased     General Gait Details: Pt able to take several very small steps left/right and forwards/backwards before fatiguing and needing to return to sitting, min A to prevent post LOB   Stairs             Wheelchair Mobility    Modified Rankin (Stroke Patients Only)       Balance Overall balance assessment: Needs assistance Sitting-balance support: Single extremity supported, Feet unsupported Sitting balance-Leahy Scale: Good     Standing balance support: Bilateral upper extremity supported, During functional activity, Reliant on assistive device for balance Standing balance-Leahy Scale: Poor  Cognition Arousal/Alertness: Awake/alert Behavior During Therapy: WFL for tasks assessed/performed Overall Cognitive Status: Within Functional Limits for tasks assessed                                           Exercises Total Joint Exercises Long Arc Quad: Strengthening, Both, 10 reps Knee Flexion: Strengthening, Both, 10 reps Other Exercises Other Exercises: Log roll training/review Other Exercises: HEP education for BLE APs, QS, and GS x 10 each every 1-2 hours daily    General Comments        Pertinent Vitals/Pain Pain Assessment Pain Assessment: 0-10 Pain Score: 7  Pain Location: mid back Pain Descriptors / Indicators: Aching, Sore Pain Intervention(s): Repositioned, Premedicated before session, Monitored during session, Patient requesting pain meds-RN notified    Home Living                          Prior Function            PT Goals (current goals can now be found in the care plan section) Progress towards PT goals: Progressing toward goals    Frequency    Min 2X/week      PT Plan Current plan remains appropriate    Co-evaluation              AM-PAC PT "6 Clicks" Mobility   Outcome Measure  Help needed turning from your back to your side while in a flat bed without using bedrails?: A Lot Help needed moving from lying on your back to sitting on the side of a flat bed without using bedrails?: A Lot Help needed moving to and from a bed to a chair (including a wheelchair)?: A Lot Help needed standing up from a chair using your arms (e.g., wheelchair or bedside chair)?: A Lot Help needed to walk in hospital room?: A Lot Help needed climbing 3-5 steps with a railing? : Total 6 Click Score: 11    End of Session Equipment Utilized During Treatment: Gait belt Activity Tolerance: Patient tolerated treatment well Patient left: in bed;with call bell/phone within reach;with bed alarm set Nurse Communication: Mobility status PT Visit Diagnosis: Muscle weakness (generalized) (M62.81);Unsteadiness on feet (R26.81);Pain Pain - Right/Left: Right Pain - part of body:  (mid back on R side)     Time: [id number] PT Time Calculation (min) (ACUTE ONLY): 30  min  Charges:  $Therapeutic Exercise: 8-22 mins $Therapeutic Activity: 8-22 mins                     D. Scott Kharson Rasmusson PT, DPT 10/25/22, 3:26 PM

## 2022-10-25 NOTE — Progress Notes (Signed)
PROGRESS NOTE    Kimberly Cook  [id number] DOB: 31-Oct-1945 DOA: 10/22/2022 PCP: System, Provider Not In   Assessment & Plan:   Principal Problem:   Falls, initial encounter Active Problems:   Back pain   T7 vertebral fracture (HCC)   HFrEF (heart failure with reduced ejection fraction) ([place])   CKD Stage IIIa   Hypothyroidism   Coronary artery disease involving native coronary artery of native heart without angina pectoris   Closed T7 spinal fracture (HCC)  Assessment and Plan: Falls: etiology unclear, possibly generalized weakness vs mechanical fall. Actually fell getting out of the shower and there was no towel on the ground. PT/OT recs SNF. Waiting on SNF placement   T7 vertebral fracture: no evidence of instrumentation failure at level T7-8 as per neuro surg. PT/OT recs SNF. Hx of chronic back pain & degenerative disc disease. Morphine, percocet prn for pain.   Hypokalemia: WNL today   Thrombocytopenia: etiology unclear and labile   Chronic systolic CHF: appears euvolemic. Holding torsemide, losartan   HLD: continue on statin   Hypothyroidism: continue on levothyroxine   Hx of CAD: nonobstructive. Continue on aspirin, statin       DVT prophylaxis:  heparin SQ  Code Status: DNR Family Communication:  Disposition Plan:  possibly d/c to SNF  Status is: Inpatient Remains inpatient appropriate because: waiting on SNF placement currently but medically stable for d/c   Level of care: Telemetry Cardiac Consultants:  Neuro surg   Procedures:    Antimicrobials:    Subjective: Pt c/o back pain especially w/ moving   Objective: Vitals:   10/25/22 0345 10/25/22 0352 10/25/22 0843 10/25/22 0843  BP: 114/73  115/81 115/81  Pulse: 76  86 86  Resp: 15  18 18   Temp: 98 F (36.7 C)  98.4 F (36.9 C) 98.4 F (36.9 C)  TempSrc: Oral  Oral Oral  SpO2: 98%  97% 97%  Weight:  88.6 kg    Height:        Intake/Output Summary (Last 24 hours) at 10/25/2022  0849 Last data filed at 10/25/2022 0348 Gross per 24 hour  Intake 1443 ml  Output 1650 ml  Net -207 ml   Filed Weights   10/22/22 1654 10/24/22 0500 10/25/22 0352  Weight: 86.6 kg 86.7 kg 88.6 kg    Examination:  General exam: Appears calm   Respiratory system: decreased breath sounds b/l otherwise clear Cardiovascular system: S1 & S2+. No rubs or gallops Gastrointestinal system: abd is soft, NT, obese & hypoactive bowel sounds. Central nervous system: alert and oriented. Moves all extremities  Psychiatry: judgement and insight appears at baseline. Flat mood and affect    Data Reviewed: I have personally reviewed following labs and imaging studies  CBC: Recent Labs  Lab 10/22/22 2349 10/23/22 0330  WBC 4.9 5.3  NEUTROABS 3.5  --   HGB 13.0 12.4  HCT 37.0 35.9*  MCV 89.4 90.2  PLT 135* [id number]*   Basic Metabolic Panel: Recent Labs  Lab 10/22/22 2349 10/23/22 0330 10/24/22 0600 10/25/22 0553  NA 134* 135 139 143  K 3.1* 3.5 3.3* 4.1  CL 99 102 102 104  CO2 23 24 26 27   GLUCOSE 106* 111* 104* 107*  BUN 10 12 8 8   CREATININE 1.06* 1.07* 0.83 0.87  CALCIUM 8.8* 8.6* 8.5* 9.4   GFR: Estimated Creatinine Clearance: 57.5 mL/min (by C-G formula based on SCr of 0.87 mg/dL). Liver Function Tests: Recent Labs  Lab 10/23/22 0330  AST 21  ALT 15  ALKPHOS 58  BILITOT 0.7  PROT 6.8  ALBUMIN 3.5   No results for input(s): "LIPASE", "AMYLASE" in the last 168 hours. No results for input(s): "AMMONIA" in the last 168 hours. Coagulation Profile: No results for input(s): "INR", "PROTIME" in the last 168 hours. Cardiac Enzymes: Recent Labs  Lab 10/23/22 0330  CKTOTAL 37*   BNP (last 3 results) No results for input(s): "PROBNP" in the last 8760 hours. HbA1C: No results for input(s): "HGBA1C" in the last 72 hours. CBG: No results for input(s): "GLUCAP" in the last 168 hours. Lipid Profile: No results for input(s): "CHOL", "HDL", "LDLCALC", "TRIG", "CHOLHDL",  "LDLDIRECT" in the last 72 hours. Thyroid Function Tests: No results for input(s): "TSH", "T4TOTAL", "FREET4", "T3FREE", "THYROIDAB" in the last 72 hours. Anemia Panel: No results for input(s): "VITAMINB12", "FOLATE", "FERRITIN", "TIBC", "IRON", "RETICCTPCT" in the last 72 hours. Sepsis Labs: No results for input(s): "PROCALCITON", "LATICACIDVEN" in the last 168 hours.  No results found for this or any previous visit (from the past 240 hour(s)).       Radiology Studies: DG Thoracic Spine 2 View  Result Date: 10/23/2022 CLINICAL DATA:  Close T7 fracture. EXAM: THORACIC SPINE 3 VIEWS with patient is sitting COMPARISON:  X-ray 04/14/2022.  CT 10/22/2022 FINDINGS: Extensive fixation hardware identified with pedicle screws and vertical fixation rods seen extending from T4 throughout the thoracic spine and into the upper lumbar spine. Expected hardware placement without evidence of failure or loosening. Scattered multilevel disc height loss with some osteophytes diffusely along the spine. No listhesis. Osteopenia. Overall preserved vertebral body height. T7 fracture noted by CT scan is less appreciated by x-ray. Overall imaging obtained to aid in treatment. IMPRESSION: Osteopenia with degenerative changes with the extensive hardware fixation with pedicle screws and vertical fixation rods from T4 through L1. The fracture of T7 described on the prior CT is less appreciated on this x-ray. No listhesis. Electronically Signed   By: Jill Side M.D.   On: 10/23/2022 10:15        Scheduled Meds:  aspirin EC  81 mg Oral QHS   atorvastatin  20 mg Oral QHS   heparin  5,000 Units Subcutaneous Q12H   levothyroxine  100 mcg Oral Q0600   midodrine  5 mg Oral TID WC   montelukast  10 mg Oral QHS   multivitamin with minerals  1 tablet Oral Daily   pantoprazole  40 mg Oral BID   pregabalin  150 mg Oral QHS   pregabalin  75 mg Oral Daily   sodium chloride flush  3 mL Intravenous Q12H   traZODone  100 mg  Oral QHS   venlafaxine  75 mg Oral BID   Continuous Infusions:     LOS: 1 day    Time spent: 25 mins    Wyvonnia Dusky, MD [facility] Pager [phone]  If 7PM-7AM, please contact night-coverage [email or web address] 10/25/2022, 8:49 AM

## 2022-10-25 NOTE — Progress Notes (Signed)
Nutrition Brief Note  Patient identified on the Malnutrition Screening Tool (MST) Report  Wt Readings from Last 15 Encounters:  10/25/22 88.6 kg  10/17/22 88 kg  10/09/22 88.9 kg  09/19/22 88.5 kg  08/21/22 86.8 kg  07/18/22 86.2 kg  06/21/22 86.2 kg  06/19/22 87.1 kg  05/21/22 80.3 kg  05/15/22 80.5 kg  05/13/22 86.2 kg  05/09/22 86.2 kg  05/06/22 86.2 kg  04/30/22 86.2 kg  04/17/22 85 kg   Pt admitted with falls and T7 vertebral.   Reviewed I/O's: -207 ml x 24 hours and +169 ml since admission  UOP: 1.7 L x 24 hours  Pt sitting up in bed at time of visit. She was on the phone. No family at bedside to provide history.   Reviewed wt hx; wt has been stable over the past 6 months.    Nutrition-Focused physical exam completed. Findings are no fat depletion, no muscle depletion, and mild edema.    Medications reviewed and include colace and miralax.   Labs reviewed.   Body mass index is 33.53 kg/m. Patient meets criteria for obesity, class I based on current BMI.   Current diet order is 2 gram sodium, patient is consuming approximately 70-95% of meals at this time. Labs and medications reviewed.   No nutrition interventions warranted at this time. If nutrition issues arise, please consult RD.   Loistine Chance, RD, LDN, [place] Registered Dietitian II Certified Diabetes Care and Education Specialist Please refer to [facility] for RD and/or RD on-call/weekend/after hours pager

## 2022-10-25 NOTE — TOC Progression Note (Signed)
Transition of Care [facility]) - Progression Note    Patient Details  Name: DAKARIA ESQUIVIAS MRN: [id number] Date of Birth: 1946/05/14  [facility]) CM/SW Contact  Laurena Slimmer, RN Phone Number: 10/25/2022, 3:17 PM  Clinical Narrative:    Damaris Schooner with Neoma Laming in admissions  Patient will need an auth to return due to therapy recommendation for PT MD, and nurse notified.   Spoke with patient and her son Gene. They have been advised patient will required an auth for STR, and discharge is not likely prior to Monday.        Expected Discharge Plan and Services                                               Social Determinants of Health (SDOH) Interventions SDOH Screenings   Food Insecurity: No Food Insecurity (10/23/2022)  Housing: Low Risk  (10/23/2022)  Transportation Needs: No Transportation Needs (10/23/2022)  Utilities: Not At Risk (10/23/2022)  Depression (PHQ2-9): Low Risk  (03/28/2022)  Financial Resource Strain: Low Risk  (07/24/2020)  Physical Activity: Unknown (07/24/2020)  Social Connections: Unknown (07/24/2020)  Stress: No Stress Concern Present (07/24/2020)  Tobacco Use: Medium Risk (10/22/2022)    Readmission Risk Interventions     No data to display

## 2022-10-25 NOTE — Plan of Care (Signed)

## 2022-10-26 DIAGNOSIS — W19XXXA Unspecified fall, initial encounter: Secondary | ICD-10-CM | POA: Diagnosis not present

## 2022-10-26 DIAGNOSIS — S22069A Unspecified fracture of T7-T8 vertebra, initial encounter for closed fracture: Secondary | ICD-10-CM | POA: Diagnosis not present

## 2022-10-26 DIAGNOSIS — D696 Thrombocytopenia, unspecified: Secondary | ICD-10-CM | POA: Diagnosis not present

## 2022-10-26 LAB — CBC
HCT: 33 % — ABNORMAL LOW (ref 36.0–46.0)
Hemoglobin: 11.3 g/dL — ABNORMAL LOW (ref 12.0–15.0)
MCH: 31.2 pg (ref 26.0–34.0)
MCHC: 34.2 g/dL (ref 30.0–36.0)
MCV: 91.2 fL (ref 80.0–100.0)
Platelets: 117 10*3/uL — ABNORMAL LOW (ref 150–400)
RBC: 3.62 MIL/uL — ABNORMAL LOW (ref 3.87–5.11)
RDW: 12.4 % (ref 11.5–15.5)
WBC: 3.5 10*3/uL — ABNORMAL LOW (ref 4.0–10.5)
nRBC: 0 % (ref 0.0–0.2)

## 2022-10-26 MED ORDER — ONDANSETRON HCL 4 MG/2ML IJ SOLN
4.0000 mg | Freq: Once | INTRAMUSCULAR | Status: AC
  Administered 2022-10-26: 4 mg via INTRAVENOUS
  Filled 2022-10-26: qty 2

## 2022-10-26 NOTE — Progress Notes (Signed)
PROGRESS NOTE    BILINDA CUBBERLEY  [id number] DOB: [birth year] DOA: 10/22/2022 PCP: System, Provider Not In   Assessment & Plan:   Principal Problem:   Falls, initial encounter Active Problems:   Back pain   T7 vertebral fracture (HCC)   HFrEF (heart failure with reduced ejection fraction) ([place])   CKD Stage IIIa   Hypothyroidism   Coronary artery disease involving native coronary artery of native heart without angina pectoris   Closed T7 spinal fracture (HCC)  Assessment and Plan: Falls: etiology unclear, possibly generalized weakness vs mechanical fall. Actually fell getting out of the shower and there was no towel on the ground. PT/OT recs SNF. Waiting on SNF placement   T7 vertebral fracture: no evidence of instrumentation failure at level T7-8 as per neuro surg. PT/OT recs SNF. Hx of chronic back pain & degenerative disc disease. Morphine, percocet prn for pain.   Hypokalemia: WNL   Thrombocytopenia: etiology unclear. Trending down today   Chronic systolic CHF: appears euvolemic. Continue to hold losartan, torsemide    HLD: continue on statin    Hypothyroidism: continue on levothyroxine   Hx of CAD: nonobstructive. Continue on statin, aspirin        DVT prophylaxis:  heparin SQ  Code Status: DNR Family Communication:  Disposition Plan:  possibly d/c to SNF  Status is: Inpatient Remains inpatient appropriate because: still waiting on SNF placement currently but medically stable for d/c   Level of care: Telemetry Cardiac Consultants:  Neuro surg   Procedures:    Antimicrobials:    Subjective: Pt c/o back pain that improves w/ pain medication   Objective: Vitals:   10/25/22 1642 10/25/22 2022 10/26/22 0027 10/26/22 0445  BP: 106/71 (!) 117/58 122/69 108/68  Pulse: 72 76 76 72  Resp: 18 18 18 20   Temp: 98.7 F (37.1 C) 98.4 F (36.9 C) 97.6 F (36.4 C) 97.6 F (36.4 C)  TempSrc: Oral Oral    SpO2: 97% 97% 96% 97%  Weight:    77.6 kg   Height:        Intake/Output Summary (Last 24 hours) at 10/26/2022 0819 Last data filed at 10/26/2022 0600 Gross per 24 hour  Intake 460 ml  Output 1000 ml  Net -540 ml   Filed Weights   10/24/22 0500 10/25/22 0352 10/26/22 0445  Weight: 86.7 kg 88.6 kg 77.6 kg    Examination:  General exam: Appears calm & comfortable   Respiratory system: diminished breath sounds b/l  Cardiovascular system: S1/S2+. No rubs or clicks  Gastrointestinal system: abd is soft, NT, obese & hypoactive bowel sounds  Central nervous system: alert and oriented. Moves all extremities  Psychiatry: judgement and insight appears at baseline. Appropriate mood and affect    Data Reviewed: I have personally reviewed following labs and imaging studies  CBC: Recent Labs  Lab 10/22/22 2349 10/23/22 0330 10/26/22 0404  WBC 4.9 5.3 3.5*  NEUTROABS 3.5  --   --   HGB 13.0 12.4 11.3*  HCT 37.0 35.9* 33.0*  MCV 89.4 90.2 91.2  PLT 135* 131* 123XX123*   Basic Metabolic Panel: Recent Labs  Lab 10/22/22 2349 10/23/22 0330 10/24/22 0600 10/25/22 0553  NA 134* 135 139 143  K 3.1* 3.5 3.3* 4.1  CL 99 102 102 104  CO2 23 24 26 27   GLUCOSE 106* 111* 104* 107*  BUN 10 12 8 8   CREATININE 1.06* 1.07* 0.83 0.87  CALCIUM 8.8* 8.6* 8.5* 9.4   GFR: Estimated Creatinine Clearance:  53.8 mL/min (by C-G formula based on SCr of 0.87 mg/dL). Liver Function Tests: Recent Labs  Lab 10/23/22 0330  AST 21  ALT 15  ALKPHOS 58  BILITOT 0.7  PROT 6.8  ALBUMIN 3.5   No results for input(s): "LIPASE", "AMYLASE" in the last 168 hours. No results for input(s): "AMMONIA" in the last 168 hours. Coagulation Profile: No results for input(s): "INR", "PROTIME" in the last 168 hours. Cardiac Enzymes: Recent Labs  Lab 10/23/22 0330  CKTOTAL 37*   BNP (last 3 results) No results for input(s): "PROBNP" in the last 8760 hours. HbA1C: No results for input(s): "HGBA1C" in the last 72 hours. CBG: No results for input(s):  "GLUCAP" in the last 168 hours. Lipid Profile: No results for input(s): "CHOL", "HDL", "LDLCALC", "TRIG", "CHOLHDL", "LDLDIRECT" in the last 72 hours. Thyroid Function Tests: No results for input(s): "TSH", "T4TOTAL", "FREET4", "T3FREE", "THYROIDAB" in the last 72 hours. Anemia Panel: No results for input(s): "VITAMINB12", "FOLATE", "FERRITIN", "TIBC", "IRON", "RETICCTPCT" in the last 72 hours. Sepsis Labs: No results for input(s): "PROCALCITON", "LATICACIDVEN" in the last 168 hours.  No results found for this or any previous visit (from the past 240 hour(s)).       Radiology Studies: No results found.      Scheduled Meds:  aspirin EC  81 mg Oral QHS   atorvastatin  20 mg Oral QHS   docusate sodium  200 mg Oral BID   heparin  5,000 Units Subcutaneous Q12H   levothyroxine  100 mcg Oral Q0600   midodrine  5 mg Oral TID WC   montelukast  10 mg Oral QHS   multivitamin with minerals  1 tablet Oral Daily   pantoprazole  40 mg Oral BID   polyethylene glycol  17 g Oral Daily   pregabalin  150 mg Oral QHS   pregabalin  75 mg Oral Daily   sodium chloride flush  3 mL Intravenous Q12H   traZODone  100 mg Oral QHS   venlafaxine  75 mg Oral BID   Continuous Infusions:     LOS: 2 days    Time spent: 25 mins    Wyvonnia Dusky, MD [facility] Pager [phone]  If 7PM-7AM, please contact night-coverage [email or web address] 10/26/2022, 8:19 AM

## 2022-10-27 DIAGNOSIS — W19XXXA Unspecified fall, initial encounter: Secondary | ICD-10-CM | POA: Diagnosis not present

## 2022-10-27 DIAGNOSIS — D696 Thrombocytopenia, unspecified: Secondary | ICD-10-CM | POA: Diagnosis not present

## 2022-10-27 DIAGNOSIS — S22069A Unspecified fracture of T7-T8 vertebra, initial encounter for closed fracture: Secondary | ICD-10-CM | POA: Diagnosis not present

## 2022-10-27 LAB — CBC
HCT: 32.4 % — ABNORMAL LOW (ref 36.0–46.0)
Hemoglobin: 11.1 g/dL — ABNORMAL LOW (ref 12.0–15.0)
MCH: 31 pg (ref 26.0–34.0)
MCHC: 34.3 g/dL (ref 30.0–36.0)
MCV: 90.5 fL (ref 80.0–100.0)
Platelets: 136 10*3/uL — ABNORMAL LOW (ref 150–400)
RBC: 3.58 MIL/uL — ABNORMAL LOW (ref 3.87–5.11)
RDW: 12.5 % (ref 11.5–15.5)
WBC: 4.7 10*3/uL (ref 4.0–10.5)
nRBC: 0 % (ref 0.0–0.2)

## 2022-10-27 LAB — GLUCOSE, CAPILLARY: Glucose-Capillary: 123 mg/dL — ABNORMAL HIGH (ref 70–99)

## 2022-10-27 MED ORDER — LACTULOSE 10 GM/15ML PO SOLN
20.0000 g | Freq: Two times a day (BID) | ORAL | Status: DC
  Administered 2022-10-27: 20 g via ORAL
  Filled 2022-10-27 (×3): qty 30

## 2022-10-27 NOTE — Progress Notes (Signed)
Kimberly Cook  [id number] DOB: 11/19/1945 DOA: 10/22/2022 PCP: System, Provider Not In   Assessment & Plan:   Principal Problem:   Falls, initial encounter Active Problems:   Back pain   T7 vertebral fracture (HCC)   HFrEF (heart failure with reduced ejection fraction) ([place])   CKD Stage IIIa   Hypothyroidism   Coronary artery disease involving native coronary artery of native heart without angina pectoris   Closed T7 spinal fracture (HCC)  Assessment and Plan: Falls: etiology unclear, possibly generalized weakness vs mechanical fall. Actually fell getting out of the shower and there was no towel on the ground. PT/OT recs SNF. Still waiting on SNF placement   T7 vertebral fracture: no evidence of instrumentation failure at level T7-8 as per neuro surg. PT/OT recs SNF. Hx of chronic back pain & degenerative disc disease. Morphine, percocet prn for pain.   Hypokalemia: WNL   Thrombocytopenia: etiology unclear. Labile   Chronic systolic CHF: appears euvolemic. Continue to hold losartan, torsemide    HLD: continue on statin   Hypothyroidism: continue on levothyroxine    Hx of CAD: nonobstructive. Continue on statin, aspirin   Constipation: continue on colace, miralax & started on lactulose       DVT prophylaxis:  heparin SQ  Code Status: DNR Family Communication:  Disposition Plan:  possibly d/c to SNF  Status is: Inpatient Remains inpatient appropriate because: still waiting on SNF placement currently but medically stable for d/c   Level of care: Telemetry Cardiac Consultants:  Neuro surg   Procedures:    Antimicrobials:    Subjective: Pt c/o constipation   Objective: Vitals:   10/26/22 1725 10/26/22 2000 10/26/22 2334 10/27/22 0511  BP: 126/70 (!) 123/95 118/87 103/85  Pulse: 80 79 78 74  Resp: 20 18 18 18   Temp: 98.4 F (36.9 C) 98.2 F (36.8 C) 98.3 F (36.8 C) 97.8 F (36.6 C)  TempSrc:  Oral Oral Oral  SpO2: 97% 98%  99% 96%  Weight:      Height:        Intake/Output Summary (Last 24 hours) at 10/27/2022 0816 Last data filed at 10/27/2022 0515 Gross per 24 hour  Intake --  Output 2000 ml  Net -2000 ml   Filed Weights   10/24/22 0500 10/25/22 0352 10/26/22 0445  Weight: 86.7 kg 88.6 kg 77.6 kg    Examination:  General exam: Appears comfortable   Respiratory system: decreased breath sounds b/l  Cardiovascular system: S1 & S2+. No rubs or gallops  Gastrointestinal system: abd is soft, NT, obes & hypoactive bowel sounds  Central nervous system: alert and oriented. Moves all extremities  Psychiatry: judgement and insight appears at baseline. Flat mood and affect    Data Reviewed: I have personally reviewed following labs and imaging studies  CBC: Recent Labs  Lab 10/22/22 2349 10/23/22 0330 10/26/22 0404 10/27/22 0331  WBC 4.9 5.3 3.5* 4.7  NEUTROABS 3.5  --   --   --   HGB 13.0 12.4 11.3* 11.1*  HCT 37.0 35.9* 33.0* 32.4*  MCV 89.4 90.2 91.2 90.5  PLT 135* 131* 117* [id number]*   Basic Metabolic Panel: Recent Labs  Lab 10/22/22 2349 10/23/22 0330 10/24/22 0600 10/25/22 0553  NA 134* 135 139 143  K 3.1* 3.5 3.3* 4.1  CL 99 102 102 104  CO2 23 24 26 27   GLUCOSE 106* 111* 104* 107*  BUN 10 12 8 8   CREATININE 1.06* 1.07* 0.83 0.87  CALCIUM 8.8* 8.6* 8.5* 9.4   GFR: Estimated Creatinine Clearance: 53.8 mL/min (by C-G formula based on SCr of 0.87 mg/dL). Liver Function Tests: Recent Labs  Lab 10/23/22 0330  AST 21  ALT 15  ALKPHOS 58  BILITOT 0.7  PROT 6.8  ALBUMIN 3.5   No results for input(s): "LIPASE", "AMYLASE" in the last 168 hours. No results for input(s): "AMMONIA" in the last 168 hours. Coagulation Profile: No results for input(s): "INR", "PROTIME" in the last 168 hours. Cardiac Enzymes: Recent Labs  Lab 10/23/22 0330  CKTOTAL 37*   BNP (last 3 results) No results for input(s): "PROBNP" in the last 8760 hours. HbA1C: No results for input(s): "HGBA1C" in  the last 72 hours. CBG: No results for input(s): "GLUCAP" in the last 168 hours. Lipid Profile: No results for input(s): "CHOL", "HDL", "LDLCALC", "TRIG", "CHOLHDL", "LDLDIRECT" in the last 72 hours. Thyroid Function Tests: No results for input(s): "TSH", "T4TOTAL", "FREET4", "T3FREE", "THYROIDAB" in the last 72 hours. Anemia Panel: No results for input(s): "VITAMINB12", "FOLATE", "FERRITIN", "TIBC", "IRON", "RETICCTPCT" in the last 72 hours. Sepsis Labs: No results for input(s): "PROCALCITON", "LATICACIDVEN" in the last 168 hours.  No results found for this or any previous visit (from the past 240 hour(s)).       Radiology Studies: No results found.      Scheduled Meds:  aspirin EC  81 mg Oral QHS   atorvastatin  20 mg Oral QHS   docusate sodium  200 mg Oral BID   heparin  5,000 Units Subcutaneous Q12H   levothyroxine  100 mcg Oral Q0600   midodrine  5 mg Oral TID WC   montelukast  10 mg Oral QHS   multivitamin with minerals  1 tablet Oral Daily   pantoprazole  40 mg Oral BID   polyethylene glycol  17 g Oral Daily   pregabalin  150 mg Oral QHS   pregabalin  75 mg Oral Daily   sodium chloride flush  3 mL Intravenous Q12H   traZODone  100 mg Oral QHS   venlafaxine  75 mg Oral BID   Continuous Infusions:     LOS: 3 days    Time spent: 25 mins    Wyvonnia Dusky, MD [facility] Pager [phone]  If 7PM-7AM, please contact night-coverage [email or web address] 10/27/2022, 8:16 AM

## 2022-10-27 NOTE — NC FL2 (Signed)
[place] LEVEL OF CARE FORM     IDENTIFICATION  Patient Name: Kimberly Cook Birthdate: Jun 18, 1946 Sex: female Admission Date (Current Location): 10/22/2022  [facility] and [place] Number:  Engineering geologist and Address:  [facility], [address] [place],  [address]      Provider Number: [id number]  Attending Physician Name and Address:  Wyvonnia Dusky, MD  Relative Name and Phone Number:       Current Level of Care: Hospital Recommended Level of Care: [place] Prior Approval Number:    Date Approved/Denied:   PASRR Number: [id number]  Discharge Plan: SNF    Current Diagnoses: Patient Active Problem List   Diagnosis Date Noted   Closed T7 spinal fracture ([place]) 10/24/2022   T7 vertebral fracture ([place]) 10/23/2022   Cystitis 05/15/2022   Acute cystitis 05/14/2022   Emesis 05/14/2022   Elevated troponin 05/14/2022   Wound infection 04/17/2022   Acquired scoliosis    Hypotension 04/13/2022   Post-concussion headache 04/13/2022   HFrEF (heart failure with reduced ejection fraction) ([place]) 04/11/2022   Peripheral neuropathy 04/11/2022   Hypokalemia 04/11/2022   Falls, initial encounter    Acute renal failure superimposed on stage 3a chronic kidney disease ([place]) 03/04/2022   UTI (urinary tract infection) [id number]   Toxic metabolic encephalopathy [id number]   Obesity (BMI 30-39.9) 03/02/2022   Exercise intolerance 03/02/2022   Left leg numbness 03/02/2022   Pericardial effusion 01/04/2022   History of stroke 08/29/2021   Neuropathy 08/29/2021   NICM (nonischemic cardiomyopathy) (HCC)    Generalized weakness    Bilateral pleural effusion    Chest pain    Pancytopenia ([place])    Acute on chronic HFrEF (heart failure with reduced ejection fraction) ([place])    Malnutrition of moderate degree 123456   Metabolic encephalopathy 99991111   Failed spinal cord stimulator, subsequent encounter  08/08/2020   S/P insertion of spinal cord stimulator 04/25/2020   Lumbar radiculopathy 12/07/2019   Lumbar facet arthropathy 12/07/2019   Spinal stenosis, lumbar region, with neurogenic claudication 12/07/2019   Osteoporosis 11/24/2019   Lumbar herniated disc 06/09/2019   Lumbar degenerative disc disease 06/09/2019   Spinal stenosis of cervical region 05/07/2019   Lumbar spondylosis 05/07/2019   Recurrent UTI 05/07/2019   Abnormal MRI, lumbar spine 02/18/2019   Abnormal gait 02/18/2019   Back pain 02/18/2019   Osteoarthritis 10/09/2018   Overactive bladder 10/09/2018   Chronic midline low back pain 04/22/2018   Chronic pain of left knee 04/22/2018   Toe fracture, left 03/10/2018   Lightheadedness 01/21/2018   PAC (premature atrial contraction) 01/19/2018   PVC's (premature ventricular contractions) 01/19/2018   Hernia, paraesophageal 12/16/2017   Kidney cysts 12/16/2017   CKD Stage IIIa 12/16/2017   Carotid artery stenosis 12/16/2017   Chronic pain 12/16/2017   Osteopenia 12/16/2017   DDD (degenerative disc disease), cervical 12/16/2017   Labile hypertension 09/05/2017   Anxiety and depression 08/27/2017   Insomnia 08/27/2017   Syncope 08/27/2017   Bilateral occipital neuralgia 08/27/2017   Lung nodule 06/30/2017   Coronary artery disease involving native coronary artery of native heart without angina pectoris 06/03/2017   SOB (shortness of breath) 04/11/2017   Labile blood pressure 03/08/2017   Syncope, near 03/08/2017   Palpitations 03/08/2017   Orthostatic lightheadedness 03/08/2017   S/P TKR (total knee replacement) 04/17/2015   Morbid obesity ([place]) 09/21/2014   Medication management 09/21/2014   Hypothyroidism 02/17/2014   Hyperlipidemia    GERD (  gastroesophageal reflux disease)    Vitamin D deficiency    Depression     Orientation RESPIRATION BLADDER Height & Weight     Self, Time, Situation, Place  Normal Incontinent, External catheter Weight: 171 lb (77.6  kg) Height:  5\' 4"  (162.6 cm)  BEHAVIORAL SYMPTOMS/MOOD NEUROLOGICAL BOWEL NUTRITION STATUS     (None) Continent Diet (2 gram sodium)  AMBULATORY STATUS COMMUNICATION OF NEEDS Skin   Limited Assist Verbally Skin abrasions, Bruising                       Personal Care Assistance Level of Assistance  Bathing, Feeding, Dressing Bathing Assistance: Limited assistance Feeding assistance: Limited assistance Dressing Assistance: Limited assistance     Functional Limitations Info  Sight, Hearing, Speech Sight Info: Adequate Hearing Info: Adequate Speech Info: Adequate    SPECIAL CARE FACTORS FREQUENCY  PT (By licensed PT), OT (By licensed OT)     PT Frequency: 5 x week OT Frequency: 5 x week            Contractures Contractures Info: Not present    Additional Factors Info  Code Status, Allergies, Isolation Precautions Code Status Info: DNR Allergies Info: Vibegron     Isolation Precautions Info: Contact: ESBL     Current Medications (10/27/2022):  This is the current hospital active medication list Current Facility-Administered Medications  Medication Dose Route Frequency Provider Last Rate Last Admin   acetaminophen (TYLENOL) tablet 650 mg  650 mg Oral Q6H PRN Para Skeans, MD   650 mg at 10/25/22 1855   Or   acetaminophen (TYLENOL) suppository 650 mg  650 mg Rectal Q6H PRN Para Skeans, MD       aspirin EC tablet 81 mg  81 mg Oral QHS Florina Ou V, MD   81 mg at 10/26/22 2127   atorvastatin (LIPITOR) tablet 20 mg  20 mg Oral QHS Florina Ou V, MD   20 mg at 10/26/22 2127   docusate sodium (COLACE) capsule 200 mg  200 mg Oral BID Wyvonnia Dusky, MD   200 mg at 10/27/22 0857   heparin injection 5,000 Units  5,000 Units Subcutaneous Q12H Para Skeans, MD   5,000 Units at 10/27/22 0857   lactulose ([place]) 10 GM/15ML solution 20 g  20 g Oral BID Wyvonnia Dusky, MD       levothyroxine (SYNTHROID) tablet 100 mcg  100 mcg Oral Q0600 Florina Ou V, MD    100 mcg at 10/27/22 0512   midodrine (PROAMATINE) tablet 5 mg  5 mg Oral TID WC Florina Ou V, MD   5 mg at 10/27/22 1200   montelukast (SINGULAIR) tablet 10 mg  10 mg Oral QHS Florina Ou V, MD   10 mg at 10/26/22 2127   morphine (PF) 2 MG/ML injection 2 mg  2 mg Intravenous Q4H PRN Wyvonnia Dusky, MD   2 mg at 10/25/22 2111   multivitamin with minerals tablet 1 tablet  1 tablet Oral Daily Wyvonnia Dusky, MD   1 tablet at 10/27/22 0857   Oral care mouth rinse  15 mL Mouth Rinse PRN Wyvonnia Dusky, MD       oxyCODONE-acetaminophen (PERCOCET) 7.5-325 MG per tablet 1 tablet  1 tablet Oral Q6H PRN Wyvonnia Dusky, MD   1 tablet at 10/27/22 0857   pantoprazole (PROTONIX) EC tablet 40 mg  40 mg Oral BID Dorothe Pea, RPH   40 mg at 10/27/22  0857   polyethylene glycol (MIRALAX / GLYCOLAX) packet 17 g  17 g Oral Daily Wyvonnia Dusky, MD   17 g at 10/27/22 0857   pregabalin (LYRICA) capsule 150 mg  150 mg Oral QHS Florina Ou V, MD   150 mg at 10/26/22 2127   pregabalin (LYRICA) capsule 75 mg  75 mg Oral Daily Florina Ou V, MD   75 mg at 10/27/22 0857   sodium chloride flush (NS) 0.9 % injection 3 mL  3 mL Intravenous Q12H Florina Ou V, MD   3 mL at 10/27/22 0900   traZODone (DESYREL) tablet 100 mg  100 mg Oral QHS Florina Ou V, MD   100 mg at 10/26/22 2127   venlafaxine [facility]) tablet 75 mg  75 mg Oral BID Para Skeans, MD   75 mg at 10/27/22 [id number]     Discharge Medications: Please see discharge summary for a list of discharge medications.  Relevant Imaging Results:  Relevant Lab Results:   Additional Information SS#: [id number]  Candie Chroman, LCSW

## 2022-10-27 NOTE — TOC Progression Note (Signed)
Transition of Care [facility]) - Progression Note    Patient Details  Name: Kimberly Cook MRN: [id number] Date of Birth: 08-31-1945  Transition of Care [facility]) CM/SW [place], LCSW Phone Number: 10/27/2022, 3:11 PM  Clinical Narrative:  Insurance authorization has been started for return to [facility] with rehab.   Expected Discharge Plan and Services                                               Social Determinants of Health (SDOH) Interventions SDOH Screenings   Food Insecurity: No Food Insecurity (10/23/2022)  Housing: Low Risk  (10/23/2022)  Transportation Needs: No Transportation Needs (10/23/2022)  Utilities: Not At Risk (10/23/2022)  Depression (PHQ2-9): Low Risk  (03/28/2022)  Financial Resource Strain: Low Risk  (07/24/2020)  Physical Activity: Unknown (07/24/2020)  Social Connections: Unknown (07/24/2020)  Stress: No Stress Concern Present (07/24/2020)  Tobacco Use: Medium Risk (10/22/2022)    Readmission Risk Interventions     No data to display

## 2022-10-28 DIAGNOSIS — Z7401 Bed confinement status: Secondary | ICD-10-CM | POA: Diagnosis not present

## 2022-10-28 DIAGNOSIS — I639 Cerebral infarction, unspecified: Secondary | ICD-10-CM | POA: Diagnosis not present

## 2022-10-28 DIAGNOSIS — M810 Age-related osteoporosis without current pathological fracture: Secondary | ICD-10-CM | POA: Diagnosis not present

## 2022-10-28 DIAGNOSIS — S22069A Unspecified fracture of T7-T8 vertebra, initial encounter for closed fracture: Secondary | ICD-10-CM | POA: Diagnosis not present

## 2022-10-28 DIAGNOSIS — K219 Gastro-esophageal reflux disease without esophagitis: Secondary | ICD-10-CM | POA: Diagnosis not present

## 2022-10-28 DIAGNOSIS — I959 Hypotension, unspecified: Secondary | ICD-10-CM | POA: Diagnosis not present

## 2022-10-28 DIAGNOSIS — N1831 Chronic kidney disease, stage 3a: Secondary | ICD-10-CM | POA: Diagnosis not present

## 2022-10-28 DIAGNOSIS — F419 Anxiety disorder, unspecified: Secondary | ICD-10-CM | POA: Diagnosis not present

## 2022-10-28 DIAGNOSIS — E039 Hypothyroidism, unspecified: Secondary | ICD-10-CM | POA: Diagnosis not present

## 2022-10-28 DIAGNOSIS — D696 Thrombocytopenia, unspecified: Secondary | ICD-10-CM | POA: Diagnosis not present

## 2022-10-28 DIAGNOSIS — W19XXXA Unspecified fall, initial encounter: Secondary | ICD-10-CM | POA: Diagnosis not present

## 2022-10-28 DIAGNOSIS — I2510 Atherosclerotic heart disease of native coronary artery without angina pectoris: Secondary | ICD-10-CM | POA: Diagnosis not present

## 2022-10-28 DIAGNOSIS — G8929 Other chronic pain: Secondary | ICD-10-CM | POA: Diagnosis not present

## 2022-10-28 DIAGNOSIS — I5020 Unspecified systolic (congestive) heart failure: Secondary | ICD-10-CM | POA: Diagnosis not present

## 2022-10-28 DIAGNOSIS — S22068D Other fracture of T7-T8 thoracic vertebra, subsequent encounter for fracture with routine healing: Secondary | ICD-10-CM | POA: Diagnosis not present

## 2022-10-28 LAB — CBC
HCT: 33.4 % — ABNORMAL LOW (ref 36.0–46.0)
Hemoglobin: 11.4 g/dL — ABNORMAL LOW (ref 12.0–15.0)
MCH: 31.3 pg (ref 26.0–34.0)
MCHC: 34.1 g/dL (ref 30.0–36.0)
MCV: 91.8 fL (ref 80.0–100.0)
Platelets: 142 10*3/uL — ABNORMAL LOW (ref 150–400)
RBC: 3.64 MIL/uL — ABNORMAL LOW (ref 3.87–5.11)
RDW: 12.7 % (ref 11.5–15.5)
WBC: 6.1 10*3/uL (ref 4.0–10.5)
nRBC: 0 % (ref 0.0–0.2)

## 2022-10-28 MED ORDER — OXYCODONE-ACETAMINOPHEN 7.5-325 MG PO TABS: 1.0000 | ORAL_TABLET | Freq: Four times a day (QID) | ORAL | 0 refills | Status: AC | PRN

## 2022-10-28 MED ORDER — ALPRAZOLAM 0.5 MG PO TABS: 0.5000 mg | ORAL_TABLET | Freq: Two times a day (BID) | ORAL | 0 refills | Status: AC | PRN

## 2022-10-28 NOTE — Consult Note (Signed)
   [facility] Inpatient Consult   10/28/2022  OLUWATONI IODICE 1946/03/24 [id number]  Orientation with Natividad Brood, [place] [facility] Liaison for review.   Location: [place] [facility] Liaison screen remotely [facility]).   [facility]) [place] Patient: Insurance [facility])    Primary Care Provider:  System, Provider Not In (followed by the facility's providers)   Patient screened for readmission hospitalization with noted medium risk score for unplanned readmission risk with medium risk and 2 IP in 6 months.  All of her community needs will be covered by the facility.   Plan: Pt will discharged to [facility] and pt is a long-term resident.    [place] does not replace or interfere with any arrangements made by the Inpatient Transition of Care team.   For questions contact:    Raina Mina, RN, [place] Hours M-F 8:00 am to 5 pm [phone] mobile [phone] [Office toll free line][facility] Office Hours are M-F 8:30 - 5 pm 24 hour nurse advise line [phone] Conceirge  [email or web address]

## 2022-10-28 NOTE — Care Management Important Message (Signed)
Important Message  Patient Details  Name: Kimberly Cook MRN: [id number] Date of Birth: April 01, 1946   Medicare Important Message Given:  Yes  Reviewed Medicare IM with patient via room phone [phone]).  Copy of Medicare IM sent securely to patient's attention via [place] per request.    Dannette Barbara 10/28/2022, 12:57 PM

## 2022-10-28 NOTE — Progress Notes (Signed)
Physical Therapy Treatment Patient Details Name: Kimberly Cook MRN: [id number] DOB: [birth year] Today's Date: 10/28/2022   History of Present Illness Patient is a 77 year old female with complaint of thoracic back pain after fall on March 16. Pt diagnosed with new T7 vertebral fracture. PMH includes extensive T4 to pelvis instrumented fusion in 2022, CHF, CKD, depression, nonischemic cardiomyopathy, stroke, and L TKA.    PT Comments    Patient agreeable to PT. She continues to require physical assistance with bed mobility. 2 bouts of standing performed with cues for technique. Cues for hand placement and anterior weight shifting with transfers. Limited standing tolerance for progression of ambulation. Anticipate patient will need frequent or constant supervision/assistance at discharge. PT will continue to follow to maximize independence and facilitate return to prior level of function.    Recommendations for follow up therapy are one component of a multi-disciplinary discharge planning process, led by the attending physician.  Recommendations may be updated based on patient status, additional functional criteria and insurance authorization.  Follow Up Recommendations  Can patient physically be transported by private vehicle: No    Assistance Recommended at Discharge Frequent or constant Supervision/Assistance  Patient can return home with the following A lot of help with walking and/or transfers;A lot of help with bathing/dressing/bathroom;Help with stairs or ramp for entrance;Assist for transportation;Assistance with cooking/housework   Equipment Recommendations  None recommended by PT    Recommendations for Other Services       Precautions / Restrictions Precautions Precautions: Fall Restrictions Weight Bearing Restrictions: No     Mobility  Bed Mobility Overal bed mobility: Needs Assistance Bed Mobility: Sit to Supine, Supine to Sit     Supine to sit: Mod assist Sit to  supine: Mod assist   General bed mobility comments: verbal cues for logroll technique for comfort. increased time and effort required    Transfers Overall transfer level: Needs assistance Equipment used: Rolling walker (2 wheels) Transfers: Sit to/from Stand, Bed to chair/wheelchair/BSC Sit to Stand: Mod assist, From elevated surface   Step pivot transfers: Min assist       General transfer comment: verbal cues for technique    Ambulation/Gait               General Gait Details: patient able to take several steps to complete transfer. further mobility limited by limited activity tolerance in standing   Stairs             Wheelchair Mobility    Modified Rankin (Stroke Patients Only)       Balance Overall balance assessment: Needs assistance Sitting-balance support: Single extremity supported, Feet unsupported Sitting balance-Leahy Scale: Good     Standing balance support: Bilateral upper extremity supported, During functional activity, Reliant on assistive device for balance Standing balance-Leahy Scale: Poor Standing balance comment: using rolling walker for support, external support required                            Cognition Arousal/Alertness: Awake/alert Behavior During Therapy: WFL for tasks assessed/performed Overall Cognitive Status: Within Functional Limits for tasks assessed                                          Exercises      General Comments General comments (skin integrity, edema, etc.): patient required maximal assistance for peri-care following bowel  movement      Pertinent Vitals/Pain Pain Assessment Pain Assessment: Faces Faces Pain Scale: Hurts little more Pain Location: mid back Pain Descriptors / Indicators: Aching, Sore Pain Intervention(s): Limited activity within patient's tolerance, Monitored during session, Repositioned    Home Living                          Prior  Function            PT Goals (current goals can now be found in the care plan section) Acute Rehab PT Goals Patient Stated Goal: to return to [facility] PT Goal Formulation: With patient Time For Goal Achievement: 11/07/22 Potential to Achieve Goals: Good Progress towards PT goals: Progressing toward goals    Frequency    Min 2X/week      PT Plan Current plan remains appropriate    Co-evaluation              AM-PAC PT "6 Clicks" Mobility   Outcome Measure  Help needed turning from your back to your side while in a flat bed without using bedrails?: A Lot Help needed moving from lying on your back to sitting on the side of a flat bed without using bedrails?: A Lot Help needed moving to and from a bed to a chair (including a wheelchair)?: A Lot Help needed standing up from a chair using your arms (e.g., wheelchair or bedside chair)?: A Lot Help needed to walk in hospital room?: A Lot Help needed climbing 3-5 steps with a railing? : Total 6 Click Score: 11    End of Session   Activity Tolerance: Patient tolerated treatment well Patient left: in bed;with call bell/phone within reach;with bed alarm set Nurse Communication: Mobility status (discussed with nurse tech) PT Visit Diagnosis: Muscle weakness (generalized) (M62.81);Unsteadiness on feet (R26.81);Pain Pain - Right/Left: Right Pain - part of body:  (back)     Time: 1131-1150 PT Time Calculation (min) (ACUTE ONLY): 19 min  Charges:  $Therapeutic Activity: 8-22 mins                     Kimberly Cook, PT, MPT    Kimberly Cook 10/28/2022, 1:20 PM

## 2022-10-28 NOTE — TOC Transition Note (Signed)
Transition of Care [facility]) - CM/SW Discharge Note   Patient Details  Name: Kimberly Cook MRN: [id number] Date of Birth: 1946-05-14  Transition of Care [facility]) CM/SW Contact:  Laurena Slimmer, RN Phone Number: 10/28/2022, 10:49 AM   Clinical Narrative:     Per [employer] portal patient auth approved. [id number] Approved 3/25-3/27  Per facility patient can be accepted today Spoke with  Neoma Laming in admissions Patient assigned room # 216-A Nurse will call report to [phone] Patient, MD, nurse, and family notified.  Face sheet and medical necessity forms printed to the floor to be added to the EMS pack EMS arranged  Discharge summary and SNF tranfer report sent in [facility].  TOC signing off.           Patient Goals and CMS Choice      Discharge Placement                         Discharge Plan and Services Additional resources added to the After Visit Summary for                                       Social Determinants of Health (SDOH) Interventions SDOH Screenings   Food Insecurity: No Food Insecurity (10/23/2022)  Housing: Low Risk  (10/23/2022)  Transportation Needs: No Transportation Needs (10/23/2022)  Utilities: Not At Risk (10/23/2022)  Depression (PHQ2-9): Low Risk  (03/28/2022)  Financial Resource Strain: Low Risk  (07/24/2020)  Physical Activity: Unknown (07/24/2020)  Social Connections: Unknown (07/24/2020)  Stress: No Stress Concern Present (07/24/2020)  Tobacco Use: Medium Risk (10/22/2022)     Readmission Risk Interventions     No data to display

## 2022-10-28 NOTE — Discharge Summary (Addendum)
Physician Discharge Summary  EPIPHANY BOCKRATH [id number] DOB: 17-Mar-1946 DOA: 10/22/2022  PCP: System, Provider Not In  Admit date: 10/22/2022 Discharge date: 10/28/2022  Admitted From: [facility]  Disposition:  [facility]  Recommendations for Outpatient Follow-up:  Follow up with PCP in 1-2 weeks F/u w/ neuro surg, Dr. Izora Ribas, in 2-4 weeks  Home Health: no  Equipment/Devices:  Discharge Condition: stable  CODE STATUS: Full  Diet recommendation: Heart Healthy  Brief/Interim Summary: HPI was taken from Dr. Florina Ou: JANANI LANGWELL is an 77 y.o. female seen in ed after fall and found to have a spinal fracture.  [facility] and fell while getting out of shower and struck his head and back.  Came in with neck and back pain. Pt has h/o back surgery , no  weakness, no incontinence.  No saddle numbness.  Patient has past medical history of heart disease,syncope,  echo from July 2023 showed an EF of 45 to 50%:  1. Left ventricular ejection fraction, by estimation, is 45 to 50%. The  left ventricle has mildly decreased function. The left ventricle has no  regional wall motion abnormalities. Left ventricular diastolic parameters  are indeterminate. The average left   ventricular global longitudinal strain is -16.8 %.   2. Right ventricular systolic function is normal. The right ventricular  size is normal. There is normal pulmonary artery systolic pressure.   3. The mitral valve is normal in structure. No evidence of mitral valve  regurgitation. No evidence of mitral stenosis.   4. The aortic valve is normal in structure. Aortic valve regurgitation is  not visualized. No aortic stenosis is present.   5. The inferior vena cava is normal in size with greater than 50%  respiratory variability, suggesting right atrial pressure of 3 mmHg.    Patient had a left heart cath in January 2023 showing EF of 30 to 35% and nonobstructive CAD:   Prox RCA lesion is  20% stenosed.  1.  Mild nonobstructive coronary artery disease. 2.  Left ventricular angiography was not performed.  EF was 30 to 35% by echo. 3.  Right heart catheterization showed mildly elevated right and left filling pressures, mild pulmonary hypertension and mildly reduced cardiac output.   As per Dr. Jimmye Norman 3/20-3/25/24: Pt presented after a fall and was found to haveT7 vertebral fracture. Pt was treated w/ supportive care. Pain meds prn. PT/OT evaluated the pt and recommended SNF.   Discharge Diagnoses:  Principal Problem:   Falls, initial encounter Active Problems:   Back pain   T7 vertebral fracture (HCC)   HFrEF (heart failure with reduced ejection fraction) (HCC)   CKD Stage IIIa   Hypothyroidism   Coronary artery disease involving native coronary artery of native heart without angina pectoris   Closed T7 spinal fracture (HCC)  Falls: etiology unclear,secondary to mechanical fall. Actually fell getting out of the shower and there was no towel on the ground. PT/OT recs SNF.   T7 vertebral fracture: no evidence of instrumentation failure at level T7-8 as per neuro surg. PT/OT recs SNF. Hx of chronic back pain & degenerative disc disease. Morphine, percocet prn for pain.   Hypokalemia: WNL   Thrombocytopenia: etiology unclear. Labile   Chronic systolic CHF: appears euvolemic. Continue to hold losartan, torsemide    HLD: continue on statin   Hypothyroidism: continue on levothyroxine    Hx of CAD: nonobstructive. Continue on statin, aspirin   Constipation: resolved   Discharge Instructions  Discharge Instructions     Diet - low sodium heart healthy   Complete by: As directed    Discharge instructions   Complete by: As directed    F/u w/ PCP in 1-2 weeks. F/u w/ neuro surg, Dr. Izora Ribas, in 2-4 weeks   Increase activity slowly   Complete by: As directed       Allergies as of 10/28/2022       Reactions   Vibegron Other (See Comments)        Medication  List     STOP taking these medications    citalopram 20 MG tablet Commonly known as: CELEXA       TAKE these medications    acetaminophen 500 MG tablet Commonly known as: TYLENOL Take 650 mg by mouth every 8 (eight) hours as needed for moderate pain.   alendronate 70 MG tablet Commonly known as: FOSAMAX Take 70 mg by mouth once a week.   ALPRAZolam 0.5 MG tablet Commonly known as: XANAX Take 1 tablet (0.5 mg total) by mouth 2 (two) times daily as needed for anxiety.   artificial tears ointment at bedtime.   aspirin EC 81 MG tablet Take 1 tablet (81 mg total) by mouth at bedtime. Swallow whole.   atorvastatin 20 MG tablet Commonly known as: LIPITOR Take 1 tablet (20 mg total) by mouth daily.   calcium-vitamin D 500-5 MG-MCG tablet Commonly known as: OSCAL WITH D Take 1 tablet by mouth 2 (two) times daily.   carboxymethylcellulose 1 % ophthalmic solution Place 1 drop into both eyes at bedtime.   cetirizine 5 MG tablet Commonly known as: ZYRTEC Take 5 mg by mouth daily.   Cranberry 450 MG Tabs Take 1 tablet by mouth every morning.   famotidine 20 MG tablet Commonly known as: PEPCID Take 20 mg by mouth at bedtime.   HYDROcodone-acetaminophen 5-325 MG tablet Commonly known as: NORCO/VICODIN Take 1 tablet by mouth every 6 (six) hours as needed for moderate pain.   hydrocortisone cream 1 % Apply 1 Application topically 2 (two) times daily as needed for itching.   levothyroxine 100 MCG tablet Commonly known as: SYNTHROID Take 1 tablet (100 mcg total) by mouth daily before breakfast.   losartan 25 MG tablet Commonly known as: COZAAR Take 0.5 tablets (12.5 mg total) by mouth daily.   melatonin 5 MG Tabs Take 1 tablet (5 mg total) by mouth at bedtime.   midodrine 5 MG tablet Commonly known as: PROAMATINE Take 1 tablet (5 mg total) by mouth 3 (three) times daily with meals.   montelukast 10 MG tablet Commonly known as: SINGULAIR Take 1 tablet (10 mg  total) by mouth at bedtime.   moxifloxacin 0.5 % ophthalmic solution Commonly known as: VIGAMOX Place 1 drop into the right eye 4 (four) times daily.   nystatin powder Generic drug: nystatin Apply 1 Application topically every 6 (six) hours as needed (groin rash).   omeprazole 40 MG capsule Commonly known as: PRILOSEC Take 1 capsule (40 mg total) by mouth daily. After lunch   ondansetron 4 MG tablet Commonly known as: Zofran Take 1 tablet (4 mg total) by mouth every 8 (eight) hours as needed for nausea or vomiting.   oxybutynin 5 MG tablet Commonly known as: DITROPAN Take 1 tablet (5 mg total) by mouth every 8 (eight) hours as needed for bladder spasms.   OXYGEN Inhale into the lungs. Uses for exertion   polyethylene glycol 17 g packet Commonly known as: MIRALAX / GLYCOLAX Take  17 g by mouth daily.   potassium chloride SA 20 MEQ tablet Commonly known as: KLOR-CON M Take 20 mEq by mouth 2 (two) times daily.   pregabalin 75 MG capsule Commonly known as: LYRICA Take 75 mg by mouth every morning.   pregabalin 150 MG capsule Commonly known as: LYRICA Take 150 mg by mouth at bedtime.   senna-docusate 8.6-50 MG tablet Commonly known as: Senokot-S Take 2 tablets by mouth at bedtime.   simethicone 80 MG chewable tablet Commonly known as: MYLICON Chew 80 mg by mouth every 6 (six) hours as needed for flatulence.   STOOL SOFTENER PO Take by mouth. 2 capsules 2 times daily   torsemide 20 MG tablet Commonly known as: DEMADEX Take 1 tablet (20 mg total) by mouth daily.   traZODone 100 MG tablet Commonly known as: DESYREL Take 100 mg by mouth at bedtime.   venlafaxine 75 MG tablet Commonly known as: EFFEXOR Take 75 mg by mouth 2 (two) times daily.   Vitamin D3 125 MCG (5000 UT) Tabs Generic drug: Cholecalciferol Take 1 tablet (5,000 Units total) by mouth daily.        Follow-up Information     Meade Maw, MD. Schedule an appointment as soon as possible  for a visit in 2 week(s).   Specialty: Neurosurgery Contact information: [place] [id number] [phone]                Allergies  Allergen Reactions   Vibegron Other (See Comments)    Consultations: Neuro surg    Procedures/Studies: DG Thoracic Spine 2 View  Result Date: 10/23/2022 CLINICAL DATA:  Close T7 fracture. EXAM: THORACIC SPINE 3 VIEWS with patient is sitting COMPARISON:  X-ray 04/14/2022.  CT 10/22/2022 FINDINGS: Extensive fixation hardware identified with pedicle screws and vertical fixation rods seen extending from T4 throughout the thoracic spine and into the upper lumbar spine. Expected hardware placement without evidence of failure or loosening. Scattered multilevel disc height loss with some osteophytes diffusely along the spine. No listhesis. Osteopenia. Overall preserved vertebral body height. T7 fracture noted by CT scan is less appreciated by x-ray. Overall imaging obtained to aid in treatment. IMPRESSION: Osteopenia with degenerative changes with the extensive hardware fixation with pedicle screws and vertical fixation rods from T4 through L1. The fracture of T7 described on the prior CT is less appreciated on this x-ray. No listhesis. Electronically Signed   By: Jill Side M.D.   On: 10/23/2022 10:15   CT Lumbar Spine Wo Contrast  Result Date: 10/22/2022 CLINICAL DATA:  Back trauma, no prior imaging (Age >= 16y).  Fall EXAM: CT THORACIC AND LUMBAR SPINE WITHOUT CONTRAST TECHNIQUE: Multidetector CT imaging of the thoracic and lumbar spine was performed without contrast. Multiplanar CT image reconstructions were also generated. RADIATION DOSE REDUCTION: This exam was performed according to the departmental dose-optimization program which includes automated exposure control, adjustment of the mA and/or kV according to patient size and/or use of iterative reconstruction technique. COMPARISON:  11/03/2019 FINDINGS: CT  THORACIC SPINE FINDINGS Alignment: Thoracolumbar scoliosis, apex left at T12 appears stable. Interval T4-bilateral iliac spinal fusion with bilateral pedicle screws at T4-L1 with posterior bars. Accentuated thoracic kyphosis. No listhesis. Vertebrae: Osseous structures are diffusely osteopenic. There is a minimally displaced axially oriented acute to subacute fracture through the T7 vertebral body just superior to the inferior endplate which extends through the anterior and posterior walls of the vertebral body without significant loss of height. There is gas  along the fracture plane noted. The posterior elements appear intact. No other fracture identified. Remaining vertebral body height is preserved. No significant periprosthetic lucency. No hardware fracture. Paraspinal and other soft tissues: There is mild paravertebral infiltration surrounding the T7 vertebral body in keeping with a small amount of surrounding interstitial hemorrhage or edema. No canal hematoma. The paraspinal soft tissues are otherwise unremarkable. Incidentally noted is moderate coronary artery calcification. Surgical changes of gastric bypass and cholecystectomy noted. Disc levels: There is intervertebral disc space narrowing and endplate remodeling throughout thoracic spine in keeping with changes of diffuse degenerative disc disease. No high-grade canal stenosis or neuroforaminal narrowing. CT LUMBAR SPINE FINDINGS Segmentation: 5 lumbar type vertebrae. Alignment: Interval thoracic-bi iliac fusion with bilateral pedicle screws, posterior bars, interbody bone cages, and bilateral iliac screws identified. Normal lumbar lordosis. Stable mild lumbar levoscoliosis, apex left at L3. No listhesis. Vertebrae: Osseous structures are diffusely osteopenic. No acute fracture. Vertebral body height is preserved. No significant periprosthetic lucency or hardware fracture to suggest hardware failure. No definite incorporation of interbody bone graft at  L5-S1. Paraspinal and other soft tissues: Negative. Disc levels: Facet hypertrophy in combination with disc height loss contributes to mild to moderate right neuroforaminal narrowing at L1-L5 and left neuroforaminal narrowing at L4-5, most severe on the right at L1-2. No high-grade canal stenosis. IMPRESSION: 1. Interval T4-bilateral iliac spinal fusion with instrumentation as described above. No significant periprosthetic lucency or hardware fracture to suggest hardware failure. No definite incorporation of interbody bone graft at L5-S1. 2. Minimally displaced axially oriented acute to subacute fracture through the T7 vertebral body without significant loss of height or associated listhesis. Posterior elements appear intact. No canal hematoma. Mild surrounding paravertebral edema. 3. No acute fracture or listhesis of the lumbar spine. 4. Multilevel degenerative disc and degenerative joint disease resulting in mild to moderate right neuroforaminal narrowing at L1-L5 and left neuroforaminal narrowing at L4-5, most severe on the right at L1-2. Electronically Signed   By: Fidela Salisbury M.D.   On: 10/22/2022 22:46   CT Thoracic Spine Wo Contrast  Result Date: 10/22/2022 CLINICAL DATA:  Back trauma, no prior imaging (Age >= 16y).  Fall EXAM: CT THORACIC AND LUMBAR SPINE WITHOUT CONTRAST TECHNIQUE: Multidetector CT imaging of the thoracic and lumbar spine was performed without contrast. Multiplanar CT image reconstructions were also generated. RADIATION DOSE REDUCTION: This exam was performed according to the departmental dose-optimization program which includes automated exposure control, adjustment of the mA and/or kV according to patient size and/or use of iterative reconstruction technique. COMPARISON:  11/03/2019 FINDINGS: CT THORACIC SPINE FINDINGS Alignment: Thoracolumbar scoliosis, apex left at T12 appears stable. Interval T4-bilateral iliac spinal fusion with bilateral pedicle screws at T4-L1 with posterior  bars. Accentuated thoracic kyphosis. No listhesis. Vertebrae: Osseous structures are diffusely osteopenic. There is a minimally displaced axially oriented acute to subacute fracture through the T7 vertebral body just superior to the inferior endplate which extends through the anterior and posterior walls of the vertebral body without significant loss of height. There is gas along the fracture plane noted. The posterior elements appear intact. No other fracture identified. Remaining vertebral body height is preserved. No significant periprosthetic lucency. No hardware fracture. Paraspinal and other soft tissues: There is mild paravertebral infiltration surrounding the T7 vertebral body in keeping with a small amount of surrounding interstitial hemorrhage or edema. No canal hematoma. The paraspinal soft tissues are otherwise unremarkable. Incidentally noted is moderate coronary artery calcification. Surgical changes of gastric bypass and cholecystectomy noted. Disc  levels: There is intervertebral disc space narrowing and endplate remodeling throughout thoracic spine in keeping with changes of diffuse degenerative disc disease. No high-grade canal stenosis or neuroforaminal narrowing. CT LUMBAR SPINE FINDINGS Segmentation: 5 lumbar type vertebrae. Alignment: Interval thoracic-bi iliac fusion with bilateral pedicle screws, posterior bars, interbody bone cages, and bilateral iliac screws identified. Normal lumbar lordosis. Stable mild lumbar levoscoliosis, apex left at L3. No listhesis. Vertebrae: Osseous structures are diffusely osteopenic. No acute fracture. Vertebral body height is preserved. No significant periprosthetic lucency or hardware fracture to suggest hardware failure. No definite incorporation of interbody bone graft at L5-S1. Paraspinal and other soft tissues: Negative. Disc levels: Facet hypertrophy in combination with disc height loss contributes to mild to moderate right neuroforaminal narrowing at  L1-L5 and left neuroforaminal narrowing at L4-5, most severe on the right at L1-2. No high-grade canal stenosis. IMPRESSION: 1. Interval T4-bilateral iliac spinal fusion with instrumentation as described above. No significant periprosthetic lucency or hardware fracture to suggest hardware failure. No definite incorporation of interbody bone graft at L5-S1. 2. Minimally displaced axially oriented acute to subacute fracture through the T7 vertebral body without significant loss of height or associated listhesis. Posterior elements appear intact. No canal hematoma. Mild surrounding paravertebral edema. 3. No acute fracture or listhesis of the lumbar spine. 4. Multilevel degenerative disc and degenerative joint disease resulting in mild to moderate right neuroforaminal narrowing at L1-L5 and left neuroforaminal narrowing at L4-5, most severe on the right at L1-2. Electronically Signed   By: Fidela Salisbury M.D.   On: 10/22/2022 22:46   CT Head Wo Contrast  Result Date: 10/22/2022 CLINICAL DATA:  Trauma. EXAM: CT HEAD WITHOUT CONTRAST CT CERVICAL SPINE WITHOUT CONTRAST TECHNIQUE: Multidetector CT imaging of the head and cervical spine was performed following the standard protocol without intravenous contrast. Multiplanar CT image reconstructions of the cervical spine were also generated. RADIATION DOSE REDUCTION: This exam was performed according to the departmental dose-optimization program which includes automated exposure control, adjustment of the mA and/or kV according to patient size and/or use of iterative reconstruction technique. COMPARISON:  None Available. FINDINGS: CT HEAD FINDINGS Brain: Mild age-related atrophy and chronic microvascular ischemic changes. There is no acute intracranial hemorrhage. No mass effect or midline shift. No extra-axial fluid collection. Vascular: No hyperdense vessel or unexpected calcification. Skull: Normal. Negative for fracture or focal lesion. Sinuses/Orbits: No acute  finding. Other: None CT CERVICAL SPINE FINDINGS Alignment: No acute subluxation. Skull base and vertebrae: No acute fracture.  Osteopenia. Soft tissues and spinal canal: No prevertebral fluid or swelling. No visible canal hematoma. Disc levels:  No acute findings.  Degenerative changes. Upper chest: Negative. Other: None IMPRESSION: 1. No acute intracranial pathology. Mild age-related atrophy and chronic microvascular ischemic changes. 2. No acute/traumatic cervical spine pathology. Electronically Signed   By: Anner Crete M.D.   On: 10/22/2022 22:30   CT Cervical Spine Wo Contrast  Result Date: 10/22/2022 CLINICAL DATA:  Trauma. EXAM: CT HEAD WITHOUT CONTRAST CT CERVICAL SPINE WITHOUT CONTRAST TECHNIQUE: Multidetector CT imaging of the head and cervical spine was performed following the standard protocol without intravenous contrast. Multiplanar CT image reconstructions of the cervical spine were also generated. RADIATION DOSE REDUCTION: This exam was performed according to the departmental dose-optimization program which includes automated exposure control, adjustment of the mA and/or kV according to patient size and/or use of iterative reconstruction technique. COMPARISON:  None Available. FINDINGS: CT HEAD FINDINGS Brain: Mild age-related atrophy and chronic microvascular ischemic changes. There is no acute  intracranial hemorrhage. No mass effect or midline shift. No extra-axial fluid collection. Vascular: No hyperdense vessel or unexpected calcification. Skull: Normal. Negative for fracture or focal lesion. Sinuses/Orbits: No acute finding. Other: None CT CERVICAL SPINE FINDINGS Alignment: No acute subluxation. Skull base and vertebrae: No acute fracture.  Osteopenia. Soft tissues and spinal canal: No prevertebral fluid or swelling. No visible canal hematoma. Disc levels:  No acute findings.  Degenerative changes. Upper chest: Negative. Other: None IMPRESSION: 1. No acute intracranial pathology. Mild  age-related atrophy and chronic microvascular ischemic changes. 2. No acute/traumatic cervical spine pathology. Electronically Signed   By: Anner Crete M.D.   On: 10/22/2022 22:30   (Echo, Carotid, EGD, Colonoscopy, ERCP)    Subjective:   Discharge Exam: Vitals:   10/28/22 1022 10/28/22 1130  BP: (!) 149/94 (!) 110/58  Pulse: 82 79  Resp: 16 18  Temp: (!) 97.5 F (36.4 C) 97.8 F (36.6 C)  SpO2: 100% 99%   Vitals:   10/28/22 0106 10/28/22 0359 10/28/22 1022 10/28/22 1130  BP: (!) 147/57 119/74 (!) 149/94 (!) 110/58  Pulse: 75 78 82 79  Resp: 17 16 16 18   Temp: 97.9 F (36.6 C) 97.8 F (36.6 C) (!) 97.5 F (36.4 C) 97.8 F (36.6 C)  TempSrc: Oral Oral    SpO2: 99% 99% 100% 99%  Weight:      Height:        General: Pt is alert, awake, not in acute distress Cardiovascular: S1/S2 +, no rubs, no gallops Respiratory: CTA bilaterally, no wheezing, no rhonchi Abdominal: Soft, NT, ND, bowel sounds + Extremities: no cyanosis    The results of significant diagnostics from this hospitalization (including imaging, microbiology, ancillary and laboratory) are listed below for reference.     Microbiology: No results found for this or any previous visit (from the past 240 hour(s)).   Labs: BNP (last 3 results) Recent Labs    01/12/22 0223 04/16/22 0554  BNP 21.1 123456*   Basic Metabolic Panel: Recent Labs  Lab 10/22/22 2349 10/23/22 0330 10/24/22 0600 10/25/22 0553  NA 134* 135 139 143  K 3.1* 3.5 3.3* 4.1  CL 99 102 102 104  CO2 23 24 26 27   GLUCOSE 106* 111* 104* 107*  BUN 10 12 8 8   CREATININE 1.06* 1.07* 0.83 0.87  CALCIUM 8.8* 8.6* 8.5* 9.4   Liver Function Tests: Recent Labs  Lab 10/23/22 0330  AST 21  ALT 15  ALKPHOS 58  BILITOT 0.7  PROT 6.8  ALBUMIN 3.5   No results for input(s): "LIPASE", "AMYLASE" in the last 168 hours. No results for input(s): "AMMONIA" in the last 168 hours. CBC: Recent Labs  Lab 10/22/22 2349 10/23/22 0330  10/26/22 0404 10/27/22 0331 10/28/22 0438  WBC 4.9 5.3 3.5* 4.7 6.1  NEUTROABS 3.5  --   --   --   --   HGB 13.0 12.4 11.3* 11.1* 11.4*  HCT 37.0 35.9* 33.0* 32.4* 33.4*  MCV 89.4 90.2 91.2 90.5 91.8  PLT 135* 131* 117* 136* 142*   Cardiac Enzymes: Recent Labs  Lab 10/23/22 0330  CKTOTAL 37*   BNP: Invalid input(s): "POCBNP" CBG: Recent Labs  Lab 10/27/22 1220  GLUCAP 123*   D-Dimer No results for input(s): "DDIMER" in the last 72 hours. Hgb A1c No results for input(s): "HGBA1C" in the last 72 hours. Lipid Profile No results for input(s): "CHOL", "HDL", "LDLCALC", "TRIG", "CHOLHDL", "LDLDIRECT" in the last 72 hours. Thyroid function studies No results for input(s): "TSH", "  T4TOTAL", "T3FREE", "THYROIDAB" in the last 72 hours.  Invalid input(s): "FREET3" Anemia work up No results for input(s): "VITAMINB12", "FOLATE", "FERRITIN", "TIBC", "IRON", "RETICCTPCT" in the last 72 hours. Urinalysis    Component Value Date/Time   COLORURINE YELLOW (A) 05/14/2022 1636   APPEARANCEUR HAZY (A) 05/14/2022 1636   APPEARANCEUR Hazy (A) 04/10/2022 1401   LABSPEC 1.006 05/14/2022 1636   PHURINE 7.0 05/14/2022 1636   GLUCOSEU NEGATIVE 05/14/2022 1636   GLUCOSEU NEGATIVE 12/16/2017 1504   HGBUR LARGE (A) 05/14/2022 1636   BILIRUBINUR NEGATIVE 05/14/2022 1636   BILIRUBINUR Negative 04/10/2022 1401   KETONESUR NEGATIVE 05/14/2022 1636   PROTEINUR 100 (A) 05/14/2022 1636   UROBILINOGEN 0.2 12/16/2017 1504   NITRITE NEGATIVE 05/14/2022 1636   LEUKOCYTESUR SMALL (A) 05/14/2022 1636   Sepsis Labs Recent Labs  Lab 10/23/22 0330 10/26/22 0404 10/27/22 0331 10/28/22 0438  WBC 5.3 3.5* 4.7 6.1   Microbiology No results found for this or any previous visit (from the past 240 hour(s)).   Time coordinating discharge: Over 30 minutes  SIGNED:   Wyvonnia Dusky, MD  [facility] 10/28/2022, 11:59 AM Pager   If 7PM-7AM, please contact  night-coverage [email or web address]

## 2022-10-30 DIAGNOSIS — I2510 Atherosclerotic heart disease of native coronary artery without angina pectoris: Secondary | ICD-10-CM | POA: Diagnosis not present

## 2022-10-30 DIAGNOSIS — M810 Age-related osteoporosis without current pathological fracture: Secondary | ICD-10-CM | POA: Diagnosis not present

## 2022-10-30 DIAGNOSIS — S22069A Unspecified fracture of T7-T8 vertebra, initial encounter for closed fracture: Secondary | ICD-10-CM | POA: Diagnosis not present

## 2022-10-30 DIAGNOSIS — I5020 Unspecified systolic (congestive) heart failure: Secondary | ICD-10-CM | POA: Diagnosis not present

## 2022-10-30 DIAGNOSIS — I639 Cerebral infarction, unspecified: Secondary | ICD-10-CM | POA: Diagnosis not present

## 2022-10-30 DIAGNOSIS — K219 Gastro-esophageal reflux disease without esophagitis: Secondary | ICD-10-CM | POA: Diagnosis not present

## 2022-10-30 DIAGNOSIS — E039 Hypothyroidism, unspecified: Secondary | ICD-10-CM | POA: Diagnosis not present

## 2022-10-30 DIAGNOSIS — F419 Anxiety disorder, unspecified: Secondary | ICD-10-CM | POA: Diagnosis not present

## 2022-11-04 ENCOUNTER — Ambulatory Visit: Payer: Medicare HMO | Admitting: Cardiology

## 2022-11-05 ENCOUNTER — Encounter: Payer: Self-pay | Admitting: Ophthalmology

## 2022-11-07 NOTE — Anesthesia Preprocedure Evaluation (Addendum)
Anesthesia Evaluation  Patient identified by MRN, date of birth, ID band Patient awake    Reviewed: Allergy & Precautions, H&P , NPO status , Patient's Chart, lab work & pertinent test results  Airway Mallampati: IV  TM Distance: <3 FB Neck ROM: Full    Dental no notable dental hx. (+) Upper Dentures, Lower Dentures   Pulmonary neg pulmonary ROS Wears oxygen 2L/m/[place]/ at night when sleeping   Pulmonary exam normal breath sounds clear to auscultation       Cardiovascular hypertension, + CAD  Normal cardiovascular exam Rhythm:Regular Rate:Normal  Echo 03-01-22 chronic heart failure with reduced EF: EF 45-50%   Neuro/Psych  Headaches  Anxiety Depression    Patient denies CVA: states she had a TIA "some years ago" with no residual TIA   GI/Hepatic Neg liver ROS, hiatal hernia,GERD  ,,  Endo/Other  Hypothyroidism    Renal/GU CRFRenal diseaseStage 3a CKD  negative genitourinary   Musculoskeletal  (+) Arthritis , Osteoarthritis,  Hx 14 hour back surgery, June 2023, understandably slow to awaken after that, but no actual adverse reactions to anesthesia.   Still having back pain S/P surgery.  Fall, 10-23-22 with T7 vertebral fracture   Abdominal   Peds negative pediatric ROS (+)  Hematology  (+) Blood dyscrasia, anemia   Anesthesia Other Findings GERD (gastroesophageal reflux disease) Arthritis Depression  Vitamin D deficiency Prediabetes  Occipital neuralgia Essential hypertension  Mini stroke Hypothyroidism   Patient does NOT have history of anesthesia; she was slow to emerge from anesthesia after a 14 hour surgery.    Patient can reportedly self-transfer, wears oxygen per nasal cannula only at night, and can lie flat, despite recent T7 fracture  Headache Allergy Coronary artery disease Diverticulosis  History of shingles Stroke Pneumonia CKD (chronic kidney disease), stage IIIa Anxiety Back pain  DDD  (degenerative disc disease), lumbar Lung nodule  Syncope Palpitations  NICM (nonischemic cardiomyopathy) Chronic HFrEF (heart failure with reduced ejection fraction)  Moderate Pericardial effusion Pleural effusion on left  Muscle weakness Acute respiratory failure, unsp w hypoxia or hypercapnia  Polyneuropathy Pancytopenia  Vitamin B deficiency Sleep difficulties  HLD (hyperlipidemia) Aortic atherosclerosis  Hiatal hernia Renal cyst, left  On supplemental oxygen by nasal cannula Complication of anesthesia  Uses wheelchair T7 vertebral fracture      Reproductive/Obstetrics negative OB ROS                             Anesthesia Physical Anesthesia Plan  ASA: 3  Anesthesia Plan: MAC   Post-op Pain Management:    Induction: Intravenous  PONV Risk Score and Plan:   Airway Management Planned: Natural Airway and Nasal Cannula  Additional Equipment:   Intra-op Plan:   Post-operative Plan:   Informed Consent: I have reviewed the patients History and Physical, chart, labs and discussed the procedure including the risks, benefits and alternatives for the proposed anesthesia with the patient or authorized representative who has indicated his/her understanding and acceptance.     Dental Advisory Given  Plan Discussed with: Anesthesiologist, CRNA and Surgeon  Anesthesia Plan Comments: (Patient consented for risks of anesthesia including but not limited to:  - adverse reactions to medications - damage to eyes, teeth, lips or other oral mucosa - nerve damage due to positioning  - sore throat or hoarseness - Damage to heart, brain, nerves, lungs, other parts of body or loss of life  Patient voiced understanding.)  Anesthesia Quick Evaluation  

## 2022-11-11 DIAGNOSIS — G8929 Other chronic pain: Secondary | ICD-10-CM | POA: Diagnosis not present

## 2022-11-11 DIAGNOSIS — J387 Other diseases of larynx: Secondary | ICD-10-CM | POA: Diagnosis not present

## 2022-11-11 DIAGNOSIS — R490 Dysphonia: Secondary | ICD-10-CM | POA: Diagnosis not present

## 2022-11-13 ENCOUNTER — Ambulatory Visit: Payer: Medicare HMO | Admitting: Anesthesiology

## 2022-11-13 ENCOUNTER — Ambulatory Visit
Admission: RE | Admit: 2022-11-13 | Discharge: 2022-11-13 | Disposition: A | Discharge status: Home / Self Care | Payer: Medicare HMO | Attending: Ophthalmology | Admitting: Ophthalmology

## 2022-11-13 ENCOUNTER — Other Ambulatory Visit: Payer: Self-pay

## 2022-11-13 ENCOUNTER — Encounter
Admission: RE | Disposition: A | Discharge status: Home / Self Care | Payer: Self-pay | Source: Home / Self Care | Attending: Ophthalmology

## 2022-11-13 DIAGNOSIS — Z8249 Family history of ischemic heart disease and other diseases of the circulatory system: Secondary | ICD-10-CM | POA: Diagnosis not present

## 2022-11-13 DIAGNOSIS — I5022 Chronic systolic (congestive) heart failure: Secondary | ICD-10-CM | POA: Diagnosis not present

## 2022-11-13 DIAGNOSIS — I129 Hypertensive chronic kidney disease with stage 1 through stage 4 chronic kidney disease, or unspecified chronic kidney disease: Secondary | ICD-10-CM | POA: Diagnosis not present

## 2022-11-13 DIAGNOSIS — I132 Hypertensive heart and chronic kidney disease with heart failure and with stage 5 chronic kidney disease, or end stage renal disease: Secondary | ICD-10-CM | POA: Diagnosis not present

## 2022-11-13 DIAGNOSIS — M549 Dorsalgia, unspecified: Secondary | ICD-10-CM | POA: Diagnosis not present

## 2022-11-13 DIAGNOSIS — H2512 Age-related nuclear cataract, left eye: Secondary | ICD-10-CM | POA: Insufficient documentation

## 2022-11-13 DIAGNOSIS — Z7722 Contact with and (suspected) exposure to environmental tobacco smoke (acute) (chronic): Secondary | ICD-10-CM | POA: Insufficient documentation

## 2022-11-13 DIAGNOSIS — I2510 Atherosclerotic heart disease of native coronary artery without angina pectoris: Secondary | ICD-10-CM | POA: Insufficient documentation

## 2022-11-13 DIAGNOSIS — N186 End stage renal disease: Secondary | ICD-10-CM | POA: Diagnosis not present

## 2022-11-13 DIAGNOSIS — E785 Hyperlipidemia, unspecified: Secondary | ICD-10-CM | POA: Diagnosis not present

## 2022-11-13 DIAGNOSIS — I428 Other cardiomyopathies: Secondary | ICD-10-CM | POA: Insufficient documentation

## 2022-11-13 DIAGNOSIS — N1831 Chronic kidney disease, stage 3a: Secondary | ICD-10-CM | POA: Diagnosis not present

## 2022-11-13 DIAGNOSIS — S22069A Unspecified fracture of T7-T8 vertebra, initial encounter for closed fracture: Secondary | ICD-10-CM | POA: Diagnosis not present

## 2022-11-13 HISTORY — PX: CATARACT EXTRACTION W/PHACO: SHX586

## 2022-11-13 SURGERY — PHACOEMULSIFICATION, CATARACT, WITH IOL INSERTION
Anesthesia: Monitor Anesthesia Care | Site: Eye | Laterality: Left

## 2022-11-13 MED ORDER — SIGHTPATH DOSE#1 NA HYALUR & NA CHOND-NA HYALUR IO KIT
PACK | INTRAOCULAR | Status: DC | PRN
  Administered 2022-11-13: 1 via OPHTHALMIC

## 2022-11-13 MED ORDER — TETRACAINE HCL 0.5 % OP SOLN
1.0000 [drp] | OPHTHALMIC | Status: DC | PRN
  Administered 2022-11-13 (×3): 1 [drp] via OPHTHALMIC

## 2022-11-13 MED ORDER — FENTANYL CITRATE (PF) 100 MCG/2ML IJ SOLN
INTRAMUSCULAR | Status: DC | PRN
  Administered 2022-11-13 (×2): 50 ug via INTRAVENOUS

## 2022-11-13 MED ORDER — SIGHTPATH DOSE#1 BSS IO SOLN
INTRAOCULAR | Status: DC | PRN
  Administered 2022-11-13: 15 mL

## 2022-11-13 MED ORDER — ARMC OPHTHALMIC DILATING DROPS
1.0000 | OPHTHALMIC | Status: DC | PRN
  Administered 2022-11-13 (×3): 1 via OPHTHALMIC

## 2022-11-13 MED ORDER — MIDAZOLAM HCL 2 MG/2ML IJ SOLN
INTRAMUSCULAR | Status: DC | PRN
  Administered 2022-11-13: 2 mg via INTRAVENOUS

## 2022-11-13 MED ORDER — NEOMYCIN-POLYMYXIN-DEXAMETH 3.5-10000-0.1 OP OINT
TOPICAL_OINTMENT | OPHTHALMIC | Status: DC | PRN
  Administered 2022-11-13: 1 via OPHTHALMIC

## 2022-11-13 MED ORDER — LACTATED RINGERS IV SOLN: INTRAVENOUS | Status: DC

## 2022-11-13 MED ORDER — CEFUROXIME OPHTHALMIC INJECTION 1 MG/0.1 ML
INJECTION | OPHTHALMIC | Status: DC | PRN
  Administered 2022-11-13: .1 mL via INTRACAMERAL

## 2022-11-13 MED ORDER — SIGHTPATH DOSE#1 BSS IO SOLN
INTRAOCULAR | Status: DC | PRN
  Administered 2022-11-13: 81 mL via OPHTHALMIC

## 2022-11-13 MED ORDER — BRIMONIDINE TARTRATE-TIMOLOL 0.2-0.5 % OP SOLN
OPHTHALMIC | Status: DC | PRN
  Administered 2022-11-13: 1 [drp] via OPHTHALMIC

## 2022-11-13 MED ORDER — SIGHTPATH DOSE#1 BSS IO SOLN
INTRAOCULAR | Status: DC | PRN
  Administered 2022-11-13: 1 mL

## 2022-11-13 SURGICAL SUPPLY — 19 items
CANNULA ANT/CHMB 27G (MISCELLANEOUS) IMPLANT
CANNULA ANT/CHMB 27GA (MISCELLANEOUS) IMPLANT
CATARACT SUITE SIGHTPATH (MISCELLANEOUS) ×1 IMPLANT
FEE CATARACT SUITE SIGHTPATH (MISCELLANEOUS) ×1 IMPLANT
GLOVE SRG 8 PF TXTR STRL LF DI (GLOVE) ×1 IMPLANT
GLOVE SURG ENC TEXT LTX SZ7.5 (GLOVE) ×1 IMPLANT
GLOVE SURG GAMMEX PI TX LF 7.5 (GLOVE) IMPLANT
GLOVE SURG UNDER POLY LF SZ8 (GLOVE) ×1
LENS IOL TECNIS EYHANCE 18.0 (Intraocular Lens) IMPLANT
NDL FILTER BLUNT 18X1 1/2 (NEEDLE) ×1 IMPLANT
NDL RETROBULBAR .5 NSTRL (NEEDLE) IMPLANT
NEEDLE FILTER BLUNT 18X1 1/2 (NEEDLE) ×1 IMPLANT
PACK VIT ANT 23G (MISCELLANEOUS) IMPLANT
RING MALYGIN 7.0 (MISCELLANEOUS) IMPLANT
SUT ETHILON 10-0 CS-B-6CS-B-6 (SUTURE)
SUT VICRYL  9 0 (SUTURE)
SUT VICRYL 9 0 (SUTURE) IMPLANT
SUTURE EHLN 10-0 CS-B-6CS-B-6 (SUTURE) IMPLANT
SYR 3ML LL SCALE MARK (SYRINGE) ×1 IMPLANT

## 2022-11-13 NOTE — Op Note (Signed)
OPERATIVE NOTE  Kimberly Cook [id number] 11/13/2022   PREOPERATIVE DIAGNOSIS:  Nuclear sclerotic cataract left eye. H25.12   POSTOPERATIVE DIAGNOSIS:    Nuclear sclerotic cataract left eye.     PROCEDURE:  Phacoemusification with posterior chamber intraocular lens placement of the left eye  Ultrasound time: Procedure(s) with comments: CATARACT EXTRACTION PHACO AND INTRAOCULAR LENS PLACEMENT (IOC) LEFT (Left) - 11.47 0:51.7  LENS:   Implant Name Type Inv. Item Serial No. Manufacturer Lot No. LRB No. Used Action  LENS IOL TECNIS EYHANCE 18.0 - [id number] Intraocular Lens LENS IOL TECNIS EYHANCE 18.0 [id number] [employer]  Left 1 Implanted      SURGEON:  Deirdre Evener, MD   ANESTHESIA:  Topical with tetracaine drops and 2% Xylocaine jelly, augmented with 1% preservative-free intracameral lidocaine.    COMPLICATIONS:  None.   DESCRIPTION OF PROCEDURE:  The patient was identified in the holding room and transported to the operating room and placed in the supine position under the operating microscope.  The left eye was identified as the operative eye and it was prepped and draped in the usual sterile ophthalmic fashion.   A 1 millimeter clear-corneal paracentesis was made at the 1:30 position.  0.5 ml of preservative-free 1% lidocaine was injected into the anterior chamber.  The anterior chamber was filled with Viscoat viscoelastic.  A 2.4 millimeter keratome was used to make a near-clear corneal incision at the 10:30 position.  .  A curvilinear capsulorrhexis was made with a cystotome and capsulorrhexis forceps.  Balanced salt solution was used to hydrodissect and hydrodelineate the nucleus.   Phacoemulsification was then used in stop and chop fashion to remove the lens nucleus and epinucleus.  The remaining cortex was then removed using the irrigation and aspiration handpiece. Provisc was then placed into the capsular bag to distend it for lens placement.  A lens was then  injected into the capsular bag.  The remaining viscoelastic was aspirated.   Wounds were hydrated with balanced salt solution.  The anterior chamber was inflated to a physiologic pressure with balanced salt solution.  No wound leaks were noted. Cefuroxime 0.1 ml of a 10mg /ml solution was injected into the anterior chamber for a dose of 1 mg of intracameral antibiotic at the completion of the case.   Timolol and Brimonidine drops and Maxitrol ointment were applied to the eye.  The patient was taken to the recovery room in stable condition without complications of anesthesia or surgery.  [name] 11/13/2022, 8:49 AM

## 2022-11-13 NOTE — Anesthesia Postprocedure Evaluation (Signed)
Anesthesia Post Note  Patient: Kimberly Cook  Procedure(s) Performed: CATARACT EXTRACTION PHACO AND INTRAOCULAR LENS PLACEMENT (IOC) LEFT (Left: Eye)  Patient location during evaluation: PACU Anesthesia Type: MAC Level of consciousness: awake and alert Pain management: pain level controlled Vital Signs Assessment: post-procedure vital signs reviewed and stable Respiratory status: spontaneous breathing, nonlabored ventilation, respiratory function stable and patient connected to nasal cannula oxygen Cardiovascular status: stable and blood pressure returned to baseline Postop Assessment: no apparent nausea or vomiting Anesthetic complications: no   No notable events documented.   Last Vitals:  Vitals:   11/13/22 0850 11/13/22 0856  BP: 120/82 131/83  Pulse: 80 83  Resp: 11 14  Temp: (!) 36.2 C (!) 36.2 C  SpO2: 94% 96%    Last Pain:  Vitals:   11/13/22 0856  TempSrc:   PainSc: 0-No pain                 Farron Watrous C Fahim Kats

## 2022-11-13 NOTE — Transfer of Care (Signed)
Immediate Anesthesia Transfer of Care Note  Patient: Kimberly Cook  Procedure(s) Performed: CATARACT EXTRACTION PHACO AND INTRAOCULAR LENS PLACEMENT (IOC) LEFT (Left: Eye)  Patient Location: PACU  Anesthesia Type: MAC  Level of Consciousness: awake, alert  and patient cooperative  Airway and Oxygen Therapy: Patient Spontanous Breathing and Patient connected to supplemental oxygen  Post-op Assessment: Post-op Vital signs reviewed, Patient's Cardiovascular Status Stable, Respiratory Function Stable, Patent Airway and No signs of Nausea or vomiting  Post-op Vital Signs: Reviewed and stable  Complications: No notable events documented.

## 2022-11-13 NOTE — H&P (Signed)
[facility]   Primary Care Physician:  System, Provider Not In Ophthalmologist: Dr. Lockie Molahadwick Yajahira Tison  Pre-Procedure History & Physical: HPI:  Kimberly Cook is a 77 y.o. female here for ophthalmic surgery.   Past Medical History:  Diagnosis Date   Acute respiratory failure, unsp w hypoxia or hypercapnia    Allergy    Anxiety    a.) on BZO (alprazolam) PRN   Aortic atherosclerosis    Arthritis    Back pain    Chronic HFrEF (heart failure with reduced ejection fraction)    a. 08/2021 echo: EF 30-35%, glob HK, GrI DD; b. 10/2021 Echo: EF 30-35%.   CKD (chronic kidney disease), stage II    Complication of anesthesia    Slow to clear meds after "14 hour back surgery"   Coronary artery disease    a. Mild to moderate CAD in LAD/diagonal by CTA (CT-FFR of apical LAD 0.79); b. 08/2021 Cath: LM nl, LAD min irregs, D1/2/3 nl, LCX nl, OM2/3 nl, RCA 20p, RPDA mild dzs, RPAV nl.   DDD (degenerative disc disease), lumbar    Depression    Diverticulosis    Essential hypertension    GERD (gastroesophageal reflux disease)    Headache    Hiatal hernia    History of shingles    HLD (hyperlipidemia)    Hypothyroidism    Lung nodule    Mini stroke 2011   Moderate Pericardial effusion    a. 08/2021 Echo: moderate circumferential pericardial effusion w/o tamponade; b. 10/2021 Echo: EF 30-35%, small to mod circumferential pericardial effusion w/o tamponade.   Muscle weakness    NICM (nonischemic cardiomyopathy)    a. 08/2021 Echo: EF 30-35%, glob HK, GrI DD, nl RV fxn, mild-mod dil LA, mod circumferential pericardial eff w/o tamponade, Mod MR; b. 10/2021 Echo: EF 30-35%, glob HK.   Occipital neuralgia    On supplemental oxygen by nasal cannula    a.) 2L.[place] at bedtime and PRN   Palpitations    Pancytopenia    Pleural effusion on left    a. 08/2021 s/p thoracentesis.   Pneumonia 2018   Polyneuropathy    Prediabetes    Renal cyst, left    a.) Renal artery US 12/26/2017: measured 2.1  cm.   Sleep difficulties    a.) takes trazodone + melatonin   Stroke    MRI 04/2008 + left sup. frontal gyrus possibly puntate infarct    Syncope 2019   T7 vertebral fracture 10/22/2022   Uses wheelchair    able to stand and self transfer   Vitamin B deficiency    Vitamin D deficiency     Past Surgical History:  Procedure Laterality Date   ABDOMINAL HYSTERECTOMY     BLADDER SURGERY     2003   BREAST EXCISIONAL BIOPSY Right Over 20 years    Benign   CATARACT EXTRACTION W/PHACO Right 10/09/2022   Procedure: CATARACT EXTRACTION PHACO AND INTRAOCULAR LENS PLACEMENT (IOC) RIGHT  8.13  00:43.5;  Surgeon: Lockie MolaBrasington, Glennys Schorsch, MD;  [facility];  Service: Ophthalmology;  Laterality: Right;   CHOLECYSTECTOMY     CYSTOSCOPY W/ URETERAL STENT PLACEMENT Left 05/13/2022   Procedure: CYSTOSCOPY WITH RETROGRADE PYELOGRAM/URETERAL LEFT STENT PLACEMENT;  Surgeon: Vanna ScotlandBrandon, Ashley, MD;  Location: [facility];  Service: Urology;  Laterality: Left;   CYSTOSCOPY WITH BIOPSY N/A 05/13/2022   Procedure: CYSTOSCOPY WITH BLADDER BIOPSY;  Surgeon: Vanna ScotlandBrandon, Ashley, MD;  Location: [facility];  Service: Urology;  Laterality: N/A;   gastroplication  KNEE ARTHROSCOPY Left 2011   PULSE GENERATOR IMPLANT Left 01/31/2020   Procedure: PLACEMENT RIGHT FLANK PULSE GENERATOR VS REMOVAL SPINAL CORD STIMULATOR;  Surgeon: Lucy Chris, MD;  Location: [facility];  Service: Neurosurgery;  Laterality: Left;   PULSE GENERATOR IMPLANT Left 04/24/2020   Procedure: REPLACEMENT LEFT FLANK PULSE GENERATOR IMPLANT;  Surgeon: Lucy Chris, MD;  Location: [facility];  Service: Neurosurgery;  Laterality: Left;  MAC w/ local   REPLACEMENT TOTAL KNEE Left    right arm fracture     RIGHT/LEFT HEART CATH AND CORONARY ANGIOGRAPHY N/A 08/28/2021   Procedure: RIGHT/LEFT HEART CATH AND CORONARY ANGIOGRAPHY;  Surgeon: Iran Ouch, MD;  Location: [facility];  Service: Cardiovascular;  Laterality: N/A;   SPINAL CORD  STIMULATOR REMOVAL N/A 06/26/2020   Procedure: SPINAL CORD STIMULATOR REMOVAL;  Surgeon: Lucy Chris, MD;  Location: [facility];  Service: Neurosurgery;  Laterality: N/A;   THORACIC LAMINECTOMY FOR SPINAL CORD STIMULATOR N/A 01/24/2020   Procedure: THORACIC SPINAL CORD STIMULATOR PADDLE TRIAL VIA LAMINECTOMY;  Surgeon: Lucy Chris, MD;  Location: [facility];  Service: Neurosurgery;  Laterality: N/A;   TONSILLECTOMY AND ADENOIDECTOMY     TOTAL KNEE ARTHROPLASTY Left 04/17/2015   Procedure: LEFT TOTAL KNEE ARTHROPLASTY;  Surgeon: Durene Romans, MD;  Location: [facility];  Service: Orthopedics;  Laterality: Left;    Prior to Admission medications   Medication Sig Start Date End Date Taking? Authorizing Provider  acetaminophen (TYLENOL) 500 MG tablet Take 650 mg by mouth every 8 (eight) hours as needed for moderate pain.   Yes [provider]  alendronate (FOSAMAX) 70 MG tablet Take 70 mg by mouth once a week.  10/23/19  Yes [provider]  aspirin EC 81 MG tablet Take 1 tablet (81 mg total) by mouth at bedtime. Swallow whole. 10/25/20  Yes McLean-Scocuzza, Pasty Spillers, MD  atorvastatin (LIPITOR) 20 MG tablet Take 1 tablet (20 mg total) by mouth daily. 06/20/20  Yes McLean-Scocuzza, Pasty Spillers, MD  calcium-vitamin D (OSCAL WITH D) 500-5 MG-MCG tablet Take 1 tablet by mouth 2 (two) times daily.   Yes [provider]  carboxymethylcellulose 1 % ophthalmic solution Place 1 drop into both eyes at bedtime.   Yes [provider]  cetirizine (ZYRTEC) 5 MG tablet Take 5 mg by mouth daily.   Yes [provider]  Cholecalciferol (VITAMIN D3) 125 MCG (5000 UT) TABS Take 1 tablet (5,000 Units total) by mouth daily. 10/09/18  Yes McLean-Scocuzza, Pasty Spillers, MD  Cranberry 450 MG TABS Take 1 tablet by mouth every morning. 01/03/22  Yes [provider]  Docusate Calcium (STOOL SOFTENER PO) Take by mouth. 2 capsules 2 times daily   Yes [provider]  famotidine (PEPCID) 20  MG tablet Take 20 mg by mouth at bedtime.   Yes [provider]  hydrocortisone cream 1 % Apply 1 Application topically 2 (two) times daily as needed for itching.   Yes [provider]  levothyroxine (SYNTHROID) 100 MCG tablet Take 1 tablet (100 mcg total) by mouth daily before breakfast. 03/06/22  Yes Wieting, Richard, MD  losartan (COZAAR) 25 MG tablet Take 0.5 tablets (12.5 mg total) by mouth daily. 08/21/22 08/16/23 Yes End, Cristal Deer, MD  melatonin 5 MG TABS Take 1 tablet (5 mg total) by mouth at bedtime. 04/04/21  Yes Gillis Santa, MD  midodrine (PROAMATINE) 5 MG tablet Take 1 tablet (5 mg total) by mouth 3 (three) times daily with meals. 09/19/22  Yes End, Cristal Deer, MD  montelukast (SINGULAIR) 10 MG tablet Take 1 tablet (10 mg total) by mouth at bedtime. 02/14/20  Yes McLean-Scocuzza, Pasty Spillers, MD  moxifloxacin (VIGAMOX) 0.5 % ophthalmic solution Place 1 drop into the right eye 4 (four) times daily.   Yes [provider]  nystatin powder Apply 1 Application topically every 6 (six) hours as needed (groin rash).   Yes [provider]  omeprazole (PRILOSEC) 40 MG capsule Take 1 capsule (40 mg total) by mouth daily. After lunch 06/23/20  Yes McLean-Scocuzza, Pasty Spillers, MD  ondansetron (ZOFRAN) 4 MG tablet Take 1 tablet (4 mg total) by mouth every 8 (eight) hours as needed for nausea or vomiting. 05/16/22  Yes Meredeth Ide, MD  oxybutynin (DITROPAN) 5 MG tablet Take 1 tablet (5 mg total) by mouth every 8 (eight) hours as needed for bladder spasms. 05/13/22  Yes Vanna Scotland, MD  OXYGEN Inhale into the lungs. Uses for exertion   Yes [provider]  polyethylene glycol (MIRALAX / GLYCOLAX) 17 g packet Take 17 g by mouth daily.   Yes [provider]  potassium chloride SA (KLOR-CON M) 20 MEQ tablet Take 20 mEq by mouth 2 (two) times daily.   Yes [provider]  pregabalin (LYRICA) 150 MG capsule Take 150 mg by mouth at bedtime.   Yes  [provider]  pregabalin (LYRICA) 75 MG capsule Take 75 mg by mouth every morning.   Yes [provider]  senna-docusate (SENOKOT-S) 8.6-50 MG tablet Take 2 tablets by mouth at bedtime.   Yes [provider]  simethicone (MYLICON) 80 MG chewable tablet Chew 80 mg by mouth every 6 (six) hours as needed for flatulence.   Yes [provider]  torsemide (DEMADEX) 20 MG tablet Take 1 tablet (20 mg total) by mouth daily. 09/19/22  Yes End, Cristal Deer, MD  traZODone (DESYREL) 100 MG tablet Take 100 mg by mouth at bedtime. 12/13/21  Yes [provider]  venlafaxine (EFFEXOR) 75 MG tablet Take 75 mg by mouth 2 (two) times daily.   Yes [provider]  White Petrolatum-Mineral Oil (ARTIFICIAL TEARS) ointment at bedtime.   Yes [provider]    Allergies as of 09/18/2022   (No Known Allergies)    Family History  Problem Relation Age of Onset   Heart disease Mother    Hypertension Mother    Diabetes Mother    Heart attack Mother 71   Heart disease Father    Heart attack Father 23   Breast cancer Maternal Aunt    Heart attack Brother     Social History   Socioeconomic History   Marital status: Widowed    Spouse name: Not on file   Number of children: Not on file   Years of education: Not on file   Highest education level: Not on file  Occupational History   Not on file  Tobacco Use   Smoking status: Never    Passive exposure: Yes   Smokeless tobacco: Never   Tobacco comments:    husbands and children smoked in home.   Vaping Use   Vaping Use: Never used  Substance and Sexual Activity   Alcohol use: No   Drug use: No   Sexual activity: Never  Other Topics Concern   Not on file  Social History Narrative   Widowed    [facility]   Has kids and grandson    Social Determinants of Health   Financial Resource Strain: Low  Risk  (07/24/2020)   Overall Financial Resource Strain (CARDIA)    Difficulty of  Paying Living Expenses: Not hard at all  Food Insecurity: No Food Insecurity (10/23/2022)   Hunger Vital Sign    Worried About Running Out of Food in the Last Year: Never true    Ran Out of Food in the Last Year: Never true  Transportation Needs: No Transportation Needs (10/23/2022)   PRAPARE - Administrator, Civil Service (Medical): No    Lack of Transportation (Non-Medical): No  Physical Activity: Unknown (07/24/2020)   Exercise Vital Sign    Days of Exercise per Week: 0 days    Minutes of Exercise per Session: Not on file  Stress: No Stress Concern Present (07/24/2020)   [employer] of Occupational Health - Occupational Stress Questionnaire    Feeling of Stress : Only a little  Social Connections: Unknown (07/24/2020)   Social Connection and Isolation Panel [NHANES]    Frequency of Communication with Friends and Family: More than three times a week    Frequency of Social Gatherings with Friends and Family: More than three times a week    Attends Religious Services: Not on file    Active Member of Clubs or Organizations: Not on file    Attends Banker Meetings: Not on file    Marital Status: Not on file  Intimate Partner Violence: Not At Risk (10/23/2022)   Humiliation, Afraid, Rape, and Kick questionnaire    Fear of Current or Ex-Partner: No    Emotionally Abused: No    Physically Abused: No    Sexually Abused: No    Review of Systems: See HPI, otherwise negative ROS  Physical Exam: BP (!) 135/95   Pulse 71   Temp (!) 96.6 F (35.9 C) (Temporal)   Resp 11   Ht  (1.626 m)   Wt 77.5 kg   SpO2 97%   BMI 29.33 kg/m  General:   Alert,  pleasant and cooperative in NAD Head:  Normocephalic and atraumatic. Lungs:  Clear to auscultation.    Heart:  Regular rate and rhythm.   Impression/Plan: SCARLET ABAD is here for ophthalmic surgery.  Risks, benefits, limitations, and alternatives regarding ophthalmic surgery have been reviewed  with the patient.  Questions have been answered.  All parties agreeable.   Lockie Mola, MD  11/13/2022, 8:06 AM

## 2022-11-14 ENCOUNTER — Encounter: Payer: Self-pay | Admitting: Ophthalmology

## 2022-11-18 ENCOUNTER — Other Ambulatory Visit: Payer: Self-pay | Admitting: Orthopedic Surgery

## 2022-11-18 DIAGNOSIS — S22069D Unspecified fracture of T7-T8 vertebra, subsequent encounter for fracture with routine healing: Secondary | ICD-10-CM

## 2022-11-18 NOTE — Progress Notes (Unsigned)
Referring Physician:  No referring provider defined for this encounter.  Primary Physician:  System, Provider Not In  History of Present Illness: 11/19/2022 [name] has a history of heart failure, CKD 2, CAD, HTN, GERD, hyperlipidemia, hypothyroidism, nonischemic cardiomyopathy, stroke.   She is in O2 at night and prn. Has not needed it in a long time.   She was seen as hospital consult by Dr. Myer Haff on 10/23/22 for thoracic back pain after a fall on 10/19/22. She has a history of fusion of T4-pelvis in 2022. She was found to have new T7 compression fracture. No brace recommended.   She is here for follow up.   She lives at [facility]- she was ambulating with a rollator but has not walked much since fall. She is afraid of falling. She is not seeing PT at SNF- her insurance stopped it. She initially had some right mid back pain but that has improved. She has no current back pain. She has some chronic numbness/tingling in left leg. No new numbness, tingling, or weakness.   Bowel/Bladder Dysfunction: none  Past Surgery:  01/12/21 and 01/15/21 - XLIFs and T4-pelvis by Dr. Myer Haff SCS placement and removal in 2021  Review of Systems:  A 10 point review of systems is negative, except for the pertinent positives and negatives detailed in the HPI.  Past Medical History: Past Medical History:  Diagnosis Date   Acute respiratory failure, unsp w hypoxia or hypercapnia    Allergy    Anxiety    a.) on BZO (alprazolam) PRN   Aortic atherosclerosis    Arthritis    Back pain    Chronic HFrEF (heart failure with reduced ejection fraction)    a. 08/2021 echo: EF 30-35%, glob HK, GrI DD; b. 10/2021 Echo: EF 30-35%.   CKD (chronic kidney disease), stage II    Complication of anesthesia    Slow to clear meds after "14 hour back surgery"   Coronary artery disease    a. Mild to moderate CAD in LAD/diagonal by CTA (CT-FFR of apical LAD 0.79); b. 08/2021 Cath: LM nl, LAD min irregs,  D1/2/3 nl, LCX nl, OM2/3 nl, RCA 20p, RPDA mild dzs, RPAV nl.   DDD (degenerative disc disease), lumbar    Depression    Diverticulosis    Essential hypertension    GERD (gastroesophageal reflux disease)    Headache    Hiatal hernia    History of shingles    HLD (hyperlipidemia)    Hypothyroidism    Lung nodule    Mini stroke 2011   Moderate Pericardial effusion    a. 08/2021 Echo: moderate circumferential pericardial effusion w/o tamponade; b. 10/2021 Echo: EF 30-35%, small to mod circumferential pericardial effusion w/o tamponade.   Muscle weakness    NICM (nonischemic cardiomyopathy)    a. 08/2021 Echo: EF 30-35%, glob HK, GrI DD, nl RV fxn, mild-mod dil LA, mod circumferential pericardial eff w/o tamponade, Mod MR; b. 10/2021 Echo: EF 30-35%, glob HK.   Occipital neuralgia    On supplemental oxygen by nasal cannula    a.) 2L.[place] at bedtime and PRN   Palpitations    Pancytopenia    Pleural effusion on left    a. 08/2021 s/p thoracentesis.   Pneumonia 2018   Polyneuropathy    Prediabetes    Renal cyst, left    a.) Renal artery [place] 12/26/2017: measured 2.1 cm.   Sleep difficulties    a.) takes trazodone + melatonin   Stroke  MRI 04/2008 + left sup. frontal gyrus possibly puntate infarct    Syncope 2019   T7 vertebral fracture 10/22/2022   Uses wheelchair    able to stand and self transfer   Vitamin B deficiency    Vitamin D deficiency     Past Surgical History: Past Surgical History:  Procedure Laterality Date   ABDOMINAL HYSTERECTOMY     BLADDER SURGERY     2003   BREAST EXCISIONAL BIOPSY Right Over 20 years    Benign   CATARACT EXTRACTION W/PHACO Right 10/09/2022   Procedure: CATARACT EXTRACTION PHACO AND INTRAOCULAR LENS PLACEMENT (IOC) RIGHT  8.13  00:43.5;  Surgeon: Lockie Mola, MD;  Location: [facility];  Service: Ophthalmology;  Laterality: Right;   CATARACT EXTRACTION W/PHACO Left 11/13/2022   Procedure: CATARACT EXTRACTION PHACO AND INTRAOCULAR  LENS PLACEMENT (IOC) LEFT;  Surgeon: Lockie Mola, MD;  Location: [facility];  Service: Ophthalmology;  Laterality: Left;  11.47 0:51.7   CHOLECYSTECTOMY     CYSTOSCOPY W/ URETERAL STENT PLACEMENT Left 05/13/2022   Procedure: CYSTOSCOPY WITH RETROGRADE PYELOGRAM/URETERAL LEFT STENT PLACEMENT;  Surgeon: Vanna Scotland, MD;  Location: [facility];  Service: Urology;  Laterality: Left;   CYSTOSCOPY WITH BIOPSY N/A 05/13/2022   Procedure: CYSTOSCOPY WITH BLADDER BIOPSY;  Surgeon: Vanna Scotland, MD;  Location: [facility];  Service: Urology;  Laterality: N/A;   gastroplication      KNEE ARTHROSCOPY Left 2011   PULSE GENERATOR IMPLANT Left 01/31/2020   Procedure: PLACEMENT RIGHT FLANK PULSE GENERATOR VS REMOVAL SPINAL CORD STIMULATOR;  Surgeon: Lucy Chris, MD;  Location: [facility];  Service: Neurosurgery;  Laterality: Left;   PULSE GENERATOR IMPLANT Left 04/24/2020   Procedure: REPLACEMENT LEFT FLANK PULSE GENERATOR IMPLANT;  Surgeon: Lucy Chris, MD;  Location: [facility];  Service: Neurosurgery;  Laterality: Left;  MAC w/ local   REPLACEMENT TOTAL KNEE Left    right arm fracture     RIGHT/LEFT HEART CATH AND CORONARY ANGIOGRAPHY N/A 08/28/2021   Procedure: RIGHT/LEFT HEART CATH AND CORONARY ANGIOGRAPHY;  Surgeon: Iran Ouch, MD;  Location: [facility];  Service: Cardiovascular;  Laterality: N/A;   SPINAL CORD STIMULATOR REMOVAL N/A 06/26/2020   Procedure: SPINAL CORD STIMULATOR REMOVAL;  Surgeon: Lucy Chris, MD;  Location: [facility];  Service: Neurosurgery;  Laterality: N/A;   THORACIC LAMINECTOMY FOR SPINAL CORD STIMULATOR N/A 01/24/2020   Procedure: THORACIC SPINAL CORD STIMULATOR PADDLE TRIAL VIA LAMINECTOMY;  Surgeon: Lucy Chris, MD;  Location: [facility];  Service: Neurosurgery;  Laterality: N/A;   TONSILLECTOMY AND ADENOIDECTOMY     TOTAL KNEE ARTHROPLASTY Left 04/17/2015   Procedure: LEFT TOTAL KNEE ARTHROPLASTY;  Surgeon: Durene Romans, MD;  Location: [facility];   Service: Orthopedics;  Laterality: Left;    Allergies: Allergies as of 11/19/2022 - Review Complete 11/19/2022  Allergen Reaction Noted   Vibegron Other (See Comments) 07/30/2022    Medications: Outpatient Encounter Medications as of 11/19/2022  Medication Sig   acetaminophen (TYLENOL) 500 MG tablet Take 650 mg by mouth every 8 (eight) hours as needed for moderate pain.   alendronate (FOSAMAX) 70 MG tablet Take 70 mg by mouth once a week.    aspirin EC 81 MG tablet Take 1 tablet (81 mg total) by mouth at bedtime. Swallow whole.   atorvastatin (LIPITOR) 20 MG tablet Take 1 tablet (20 mg total) by mouth daily.   calcium-vitamin D (OSCAL WITH D) 500-5 MG-MCG tablet Take 1 tablet by mouth 2 (two) times daily.   cetirizine (ZYRTEC)  5 MG tablet Take 5 mg by mouth daily.   Cholecalciferol (VITAMIN D3) 125 MCG (5000 UT) TABS Take 1 tablet (5,000 Units total) by mouth daily.   citalopram (CELEXA) 20 MG tablet Take 20 mg by mouth daily.   Cranberry 450 MG TABS Take 1 tablet by mouth every morning.   famotidine (PEPCID) 20 MG tablet Take 20 mg by mouth at bedtime.   HYDROcodone-acetaminophen (NORCO/VICODIN) 5-325 MG tablet Take 1 tablet by mouth every 6 (six) hours as needed.   hydrocortisone cream 1 % Apply 1 Application topically 2 (two) times daily as needed for itching.   ketorolac (ACULAR) 0.5 % ophthalmic solution Place 1 drop into the left eye 4 (four) times daily.   levothyroxine (SYNTHROID) 100 MCG tablet Take 1 tablet (100 mcg total) by mouth daily before breakfast.   losartan (COZAAR) 25 MG tablet Take 0.5 tablets (12.5 mg total) by mouth daily.   melatonin 5 MG TABS Take 1 tablet (5 mg total) by mouth at bedtime.   midodrine (PROAMATINE) 5 MG tablet Take 1 tablet (5 mg total) by mouth 3 (three) times daily with meals.   montelukast (SINGULAIR) 10 MG tablet Take 1 tablet (10 mg total) by mouth at bedtime.   moxifloxacin (VIGAMOX) 0.5 % ophthalmic solution Place 1 drop into the right eye  4 (four) times daily.   nystatin powder Apply 1 Application topically every 6 (six) hours as needed (groin rash).   omeprazole (PRILOSEC) 40 MG capsule Take 1 capsule (40 mg total) by mouth daily. After lunch   ondansetron (ZOFRAN) 4 MG tablet Take 1 tablet (4 mg total) by mouth every 8 (eight) hours as needed for nausea or vomiting.   oxybutynin (DITROPAN) 5 MG tablet Take 1 tablet (5 mg total) by mouth every 8 (eight) hours as needed for bladder spasms.   polyethylene glycol (MIRALAX / GLYCOLAX) 17 g packet Take 17 g by mouth daily.   potassium chloride SA (KLOR-CON M) 20 MEQ tablet Take 20 mEq by mouth 2 (two) times daily.   prednisoLONE acetate (PRED FORTE) 1 % ophthalmic suspension Place 1 drop into the left eye 4 (four) times daily.   pregabalin (LYRICA) 150 MG capsule Take 150 mg by mouth at bedtime.   pregabalin (LYRICA) 75 MG capsule Take 75 mg by mouth every morning.   senna-docusate (SENOKOT-S) 8.6-50 MG tablet Take 2 tablets by mouth at bedtime.   simethicone (MYLICON) 80 MG chewable tablet Chew 80 mg by mouth every 6 (six) hours as needed for flatulence.   torsemide (DEMADEX) 20 MG tablet Take 1 tablet (20 mg total) by mouth daily.   traZODone (DESYREL) 100 MG tablet Take 100 mg by mouth at bedtime.   venlafaxine (EFFEXOR) 75 MG tablet Take 75 mg by mouth 2 (two) times daily.   carboxymethylcellulose 1 % ophthalmic solution Place 1 drop into both eyes at bedtime.   OXYGEN Inhale into the lungs. Uses for exertion   White Petrolatum-Mineral Oil (ARTIFICIAL TEARS) ointment at bedtime.   [DISCONTINUED] Docusate Calcium (STOOL SOFTENER PO) Take by mouth. 2 capsules 2 times daily   No facility-administered encounter medications on file as of 11/19/2022.    Social History: Social History   Tobacco Use   Smoking status: Never    Passive exposure: Yes   Smokeless tobacco: Never   Tobacco comments:    husbands and children smoked in home.   Vaping Use   Vaping Use: Never used   Substance Use Topics   Alcohol use:  No   Drug use: No    Family Medical History: Family History  Problem Relation Age of Onset   Heart disease Mother    Hypertension Mother    Diabetes Mother    Heart attack Mother 36   Heart disease Father    Heart attack Father 42   Breast cancer Maternal Aunt    Heart attack Brother     Physical Examination: Vitals:   11/19/22 1320  BP: 120/78    General: Patient is well developed, well nourished, calm, collected, and in no apparent distress. Attention to examination is appropriate.  Respiratory: Patient is breathing without any difficulty.   NEUROLOGICAL:     Awake, alert, oriented to person, place, and time.  Speech is clear and fluent. Fund of knowledge is appropriate.   Cranial Nerves: Pupils equal round and reactive to light.  Facial tone is symmetric.    TL incision is well healed. She has no thoracic tenderness.   No abnormal lesions on exposed skin.   Strength: Side Biceps Triceps Deltoid Interossei Grip Wrist Ext. Wrist Flex.  R 5 5 5 5 5 5 5   L 5 5 5 5 5 5 5    Side Iliopsoas Quads Hamstring PF DF EHL  R 5 5 5 5 5 5   L 5 5 5 5 5 5    Reflexes are 2+ and symmetric at the biceps, triceps, brachioradialis, patella and achilles.   Hoffman's is absent.  Clonus is not present.   Bilateral upper and lower extremity sensation is intact to light touch.     Gait is not tested. She is in a WC.   Medical Decision Making  Imaging: Thoracic and lumbar xrays dated 11/19/22:  Hardware intact with no complications. Difficult to visualize T7 compression fracture but it appears stable.   Above radiology report not available.    Thoracic xrays dated 10/23/22:  FINDINGS: Extensive fixation hardware identified with pedicle screws and vertical fixation rods seen extending from T4 throughout the thoracic spine and into the upper lumbar spine. Expected hardware placement without evidence of failure or loosening.  Scattered multilevel disc height loss with some osteophytes diffusely along the spine. No listhesis. Osteopenia. Overall preserved vertebral body height. T7 fracture noted by CT scan is less appreciated by x-ray. Overall imaging obtained to aid in treatment.   IMPRESSION: Osteopenia with degenerative changes with the extensive hardware fixation with pedicle screws and vertical fixation rods from T4 through L1.   The fracture of T7 described on the prior CT is less appreciated on this x-ray. No listhesis.     Electronically Signed   By: Karen Kays M.D.   On: 10/23/2022 10:15   CT of thoracic and lumbar spine dated 10/22/22:  FINDINGS: CT THORACIC SPINE FINDINGS   Alignment: Thoracolumbar scoliosis, apex left at T12 appears stable. Interval T4-bilateral iliac spinal fusion with bilateral pedicle screws at T4-L1 with posterior bars. Accentuated thoracic kyphosis. No listhesis.   Vertebrae: Osseous structures are diffusely osteopenic. There is a minimally displaced axially oriented acute to subacute fracture through the T7 vertebral body just superior to the inferior endplate which extends through the anterior and posterior walls of the vertebral body without significant loss of height. There is gas along the fracture plane noted. The posterior elements appear intact. No other fracture identified. Remaining vertebral body height is preserved. No significant periprosthetic lucency. No hardware fracture.   Paraspinal and other soft tissues: There is mild paravertebral infiltration surrounding the T7 vertebral body in keeping  with a small amount of surrounding interstitial hemorrhage or edema. No canal hematoma. The paraspinal soft tissues are otherwise unremarkable. Incidentally noted is moderate coronary artery calcification. Surgical changes of gastric bypass and cholecystectomy noted.   Disc levels: There is intervertebral disc space narrowing and endplate remodeling  throughout thoracic spine in keeping with changes of diffuse degenerative disc disease. No high-grade canal stenosis or neuroforaminal narrowing.   CT LUMBAR SPINE FINDINGS   Segmentation: 5 lumbar type vertebrae.   Alignment: Interval thoracic-bi iliac fusion with bilateral pedicle screws, posterior bars, interbody bone cages, and bilateral iliac screws identified. Normal lumbar lordosis. Stable mild lumbar levoscoliosis, apex left at L3. No listhesis.   Vertebrae: Osseous structures are diffusely osteopenic. No acute fracture. Vertebral body height is preserved. No significant periprosthetic lucency or hardware fracture to suggest hardware failure. No definite incorporation of interbody bone graft at L5-S1.   Paraspinal and other soft tissues: Negative.   Disc levels: Facet hypertrophy in combination with disc height loss contributes to mild to moderate right neuroforaminal narrowing at L1-L5 and left neuroforaminal narrowing at L4-5, most severe on the right at L1-2. No high-grade canal stenosis.   IMPRESSION: 1. Interval T4-bilateral iliac spinal fusion with instrumentation as described above. No significant periprosthetic lucency or hardware fracture to suggest hardware failure. No definite incorporation of interbody bone graft at L5-S1. 2. Minimally displaced axially oriented acute to subacute fracture through the T7 vertebral body without significant loss of height or associated listhesis. Posterior elements appear intact. No canal hematoma. Mild surrounding paravertebral edema. 3. No acute fracture or listhesis of the lumbar spine. 4. Multilevel degenerative disc and degenerative joint disease resulting in mild to moderate right neuroforaminal narrowing at L1-L5 and left neuroforaminal narrowing at L4-5, most severe on the right at L1-2.     Electronically Signed   By: Helyn Numbers M.D.   On: 10/22/2022 22:46  I have personally reviewed the images and agree with  the above interpretation.  Assessment and Plan: Kimberly Cook is a pleasant 77 y.o. female has history of T4-pelvis fusion with new T7 compression fracture s/p fall on 10/19/22. She has no current thoracic pain.   Xrays from today show T7 fracture is stable.   Treatment options discussed with patient and following plan made:   - No brace needed. She should avoid bending, twisting, or lifting (has not done any of this since her fusion).  - Recommend she start PT at SNF for progressive mobilization. Orders done.  - Follow up in 6 weeks with repeat xrays. Will need to come 1 hour prior to visit and to [facility].   I spent a total of 20 minutes in face-to-face and non-face-to-face activities related to this patient's care today including review of outside records, review of imaging, review of symptoms, physical exam, discussion of differential diagnosis, discussion of treatment options, and documentation.   Drake Leach PA-C Dept. of Neurosurgery

## 2022-11-19 ENCOUNTER — Ambulatory Visit
Admission: RE | Admit: 2022-11-19 | Discharge: 2022-11-19 | Disposition: A | Discharge status: Home / Self Care | Payer: Medicare HMO | Source: Ambulatory Visit | Attending: Orthopedic Surgery | Admitting: Orthopedic Surgery

## 2022-11-19 ENCOUNTER — Ambulatory Visit (INDEPENDENT_AMBULATORY_CARE_PROVIDER_SITE_OTHER): Payer: Medicare HMO | Admitting: Orthopedic Surgery

## 2022-11-19 ENCOUNTER — Encounter: Payer: Self-pay | Admitting: Orthopedic Surgery

## 2022-11-19 ENCOUNTER — Ambulatory Visit
Admission: RE | Admit: 2022-11-19 | Discharge: 2022-11-19 | Disposition: A | Discharge status: Home / Self Care | Payer: Medicare HMO | Attending: Orthopedic Surgery | Admitting: Orthopedic Surgery

## 2022-11-19 ENCOUNTER — Other Ambulatory Visit: Payer: Self-pay | Admitting: Orthopedic Surgery

## 2022-11-19 VITALS — BP 120/78 | Ht 64.0 in | Wt 170.0 lb

## 2022-11-19 DIAGNOSIS — W19XXXD Unspecified fall, subsequent encounter: Secondary | ICD-10-CM

## 2022-11-19 DIAGNOSIS — S22069D Unspecified fracture of T7-T8 vertebra, subsequent encounter for fracture with routine healing: Secondary | ICD-10-CM

## 2022-11-19 DIAGNOSIS — S22068D Other fracture of T7-T8 thoracic vertebra, subsequent encounter for fracture with routine healing: Secondary | ICD-10-CM

## 2022-11-19 DIAGNOSIS — M4325 Fusion of spine, thoracolumbar region: Secondary | ICD-10-CM | POA: Diagnosis not present

## 2022-11-19 DIAGNOSIS — M4326 Fusion of spine, lumbar region: Secondary | ICD-10-CM | POA: Diagnosis not present

## 2022-11-19 DIAGNOSIS — Z981 Arthrodesis status: Secondary | ICD-10-CM | POA: Diagnosis not present

## 2022-11-19 DIAGNOSIS — M4324 Fusion of spine, thoracic region: Secondary | ICD-10-CM | POA: Diagnosis not present

## 2022-11-19 DIAGNOSIS — Z79899 Other long term (current) drug therapy: Secondary | ICD-10-CM | POA: Diagnosis not present

## 2022-11-21 DIAGNOSIS — K219 Gastro-esophageal reflux disease without esophagitis: Secondary | ICD-10-CM | POA: Diagnosis not present

## 2022-11-21 DIAGNOSIS — R413 Other amnesia: Secondary | ICD-10-CM | POA: Diagnosis not present

## 2022-11-21 DIAGNOSIS — G8929 Other chronic pain: Secondary | ICD-10-CM | POA: Diagnosis not present

## 2022-11-21 DIAGNOSIS — I509 Heart failure, unspecified: Secondary | ICD-10-CM | POA: Diagnosis not present

## 2022-11-25 DIAGNOSIS — G629 Polyneuropathy, unspecified: Secondary | ICD-10-CM | POA: Diagnosis not present

## 2022-11-26 DIAGNOSIS — L603 Nail dystrophy: Secondary | ICD-10-CM | POA: Diagnosis not present

## 2022-11-26 DIAGNOSIS — I739 Peripheral vascular disease, unspecified: Secondary | ICD-10-CM | POA: Diagnosis not present

## 2022-11-27 DIAGNOSIS — F419 Anxiety disorder, unspecified: Secondary | ICD-10-CM | POA: Diagnosis not present

## 2022-11-27 DIAGNOSIS — R413 Other amnesia: Secondary | ICD-10-CM | POA: Diagnosis not present

## 2022-11-27 DIAGNOSIS — W19XXXA Unspecified fall, initial encounter: Secondary | ICD-10-CM | POA: Diagnosis not present

## 2022-11-28 DIAGNOSIS — M1711 Unilateral primary osteoarthritis, right knee: Secondary | ICD-10-CM | POA: Diagnosis not present

## 2022-11-28 DIAGNOSIS — M19011 Primary osteoarthritis, right shoulder: Secondary | ICD-10-CM | POA: Diagnosis not present

## 2022-12-02 DIAGNOSIS — W19XXXA Unspecified fall, initial encounter: Secondary | ICD-10-CM | POA: Diagnosis not present

## 2022-12-02 DIAGNOSIS — R52 Pain, unspecified: Secondary | ICD-10-CM | POA: Diagnosis not present

## 2022-12-03 DIAGNOSIS — Z0100 Encounter for examination of eyes and vision without abnormal findings: Secondary | ICD-10-CM | POA: Diagnosis not present

## 2022-12-04 NOTE — Progress Notes (Unsigned)
Cardiology Office Note:    Date:  12/05/2022   ID:  Kimberly Cook, DOB 11/04/1945, MRN [id number]  PCP:  System, Provider Not In  [facility] Cardiologist:  Yvonne Kendall, MD  [facility] Electrophysiologist:  None   Referring MD: No ref. provider found   Chief Complaint: 2 month follow-up  History of Present Illness:    Kimberly Cook is a 77 y.o. female with a hx of moderate CAD by cardiac CTA, chronic HFrEF, HTN, HLD, mini strokes, GERD, depression, and anxiety who presents for follow-up.   The patient was seen 08/2022 and was on Entresto and midodrine. This was switched to Losartan.   Last seen 09/19/22 and was volume overloaded and started on Torsemide 20mg  daily. Midodrine was decreased to 5mg  TID.   Today, the patient reports dizziness. It's worse when she stands up. She doesn't feel great this morning. She feels her head is swimmy-headed. Doesn't feel clear headed. When she stands up her BP drops. She reports multiple falls. It's mostly when she stands up and then falls. It can be mechanical. She denies chest pain or shortness of breath. She had an episode of sharp pain, suspect from muscles.   Past Medical History:  Diagnosis Date   Acute respiratory failure, unsp w hypoxia or hypercapnia (HCC)    Allergy    Anxiety    a.) on BZO (alprazolam) PRN   Aortic atherosclerosis (HCC)    Arthritis    Back pain    Chronic HFrEF (heart failure with reduced ejection fraction) (HCC)    a. 08/2021 echo: EF 30-35%, glob HK, GrI DD; b. 10/2021 Echo: EF 30-35%.   CKD (chronic kidney disease), stage II    Complication of anesthesia    Slow to clear meds after "14 hour back surgery"   Coronary artery disease    a. Mild to moderate CAD in LAD/diagonal by CTA (CT-FFR of apical LAD 0.79); b. 08/2021 Cath: LM nl, LAD min irregs, D1/2/3 nl, LCX nl, OM2/3 nl, RCA 20p, RPDA mild dzs, RPAV nl.   DDD (degenerative disc disease), lumbar    Depression    Diverticulosis    Essential  hypertension    GERD (gastroesophageal reflux disease)    Headache    Hiatal hernia    History of shingles    HLD (hyperlipidemia)    Hypothyroidism    Lung nodule    Mini stroke 2011   Moderate Pericardial effusion    a. 08/2021 Echo: moderate circumferential pericardial effusion w/o tamponade; b. 10/2021 Echo: EF 30-35%, small to mod circumferential pericardial effusion w/o tamponade.   Muscle weakness    NICM (nonischemic cardiomyopathy) (HCC)    a. 08/2021 Echo: EF 30-35%, glob HK, GrI DD, nl RV fxn, mild-mod dil LA, mod circumferential pericardial eff w/o tamponade, Mod MR; b. 10/2021 Echo: EF 30-35%, glob HK.   Occipital neuralgia    On supplemental oxygen by nasal cannula    a.) 2L.[place] at bedtime and PRN   Palpitations    Pancytopenia (HCC)    Pleural effusion on left    a. 08/2021 s/p thoracentesis.   Pneumonia 2018   Polyneuropathy    Prediabetes    Renal cyst, left    a.) Renal artery [place] 12/26/2017: measured 2.1 cm.   Sleep difficulties    a.) takes trazodone + melatonin   Stroke (HCC)    MRI 04/2008 + left sup. frontal gyrus possibly puntate infarct    Syncope 2019   T7 vertebral fracture (  HCC) 10/22/2022   Uses wheelchair    able to stand and self transfer   Vitamin B deficiency    Vitamin D deficiency     Past Surgical History:  Procedure Laterality Date   ABDOMINAL HYSTERECTOMY     BLADDER SURGERY     2003   BREAST EXCISIONAL BIOPSY Right Over 20 years    Benign   CATARACT EXTRACTION W/PHACO Right 10/09/2022   Procedure: CATARACT EXTRACTION PHACO AND INTRAOCULAR LENS PLACEMENT (IOC) RIGHT  8.13  00:43.5;  Surgeon: Lockie Mola, MD;  Location: [facility];  Service: Ophthalmology;  Laterality: Right;   CATARACT EXTRACTION W/PHACO Left 11/13/2022   Procedure: CATARACT EXTRACTION PHACO AND INTRAOCULAR LENS PLACEMENT (IOC) LEFT;  Surgeon: Lockie Mola, MD;  Location: [facility];  Service: Ophthalmology;  Laterality: Left;  11.47 0:51.7    CHOLECYSTECTOMY     CYSTOSCOPY W/ URETERAL STENT PLACEMENT Left 05/13/2022   Procedure: CYSTOSCOPY WITH RETROGRADE PYELOGRAM/URETERAL LEFT STENT PLACEMENT;  Surgeon: Vanna Scotland, MD;  Location: [facility];  Service: Urology;  Laterality: Left;   CYSTOSCOPY WITH BIOPSY N/A 05/13/2022   Procedure: CYSTOSCOPY WITH BLADDER BIOPSY;  Surgeon: Vanna Scotland, MD;  Location: [facility];  Service: Urology;  Laterality: N/A;   gastroplication      KNEE ARTHROSCOPY Left 2011   PULSE GENERATOR IMPLANT Left 01/31/2020   Procedure: PLACEMENT RIGHT FLANK PULSE GENERATOR VS REMOVAL SPINAL CORD STIMULATOR;  Surgeon: Lucy Chris, MD;  Location: [facility];  Service: Neurosurgery;  Laterality: Left;   PULSE GENERATOR IMPLANT Left 04/24/2020   Procedure: REPLACEMENT LEFT FLANK PULSE GENERATOR IMPLANT;  Surgeon: Lucy Chris, MD;  Location: [facility];  Service: Neurosurgery;  Laterality: Left;  MAC w/ local   REPLACEMENT TOTAL KNEE Left    right arm fracture     RIGHT/LEFT HEART CATH AND CORONARY ANGIOGRAPHY N/A 08/28/2021   Procedure: RIGHT/LEFT HEART CATH AND CORONARY ANGIOGRAPHY;  Surgeon: Iran Ouch, MD;  Location: [facility];  Service: Cardiovascular;  Laterality: N/A;   SPINAL CORD STIMULATOR REMOVAL N/A 06/26/2020   Procedure: SPINAL CORD STIMULATOR REMOVAL;  Surgeon: Lucy Chris, MD;  Location: [facility];  Service: Neurosurgery;  Laterality: N/A;   THORACIC LAMINECTOMY FOR SPINAL CORD STIMULATOR N/A 01/24/2020   Procedure: THORACIC SPINAL CORD STIMULATOR PADDLE TRIAL VIA LAMINECTOMY;  Surgeon: Lucy Chris, MD;  Location: [facility];  Service: Neurosurgery;  Laterality: N/A;   TONSILLECTOMY AND ADENOIDECTOMY     TOTAL KNEE ARTHROPLASTY Left 04/17/2015   Procedure: LEFT TOTAL KNEE ARTHROPLASTY;  Surgeon: Durene Romans, MD;  Location: [facility];  Service: Orthopedics;  Laterality: Left;    Current Medications: Current Meds  Medication Sig   acetaminophen (TYLENOL) 500 MG tablet Take 650 mg by  mouth every 8 (eight) hours as needed for moderate pain.   alendronate (FOSAMAX) 70 MG tablet Take 70 mg by mouth once a week.    aspirin EC 81 MG tablet Take 1 tablet (81 mg total) by mouth at bedtime. Swallow whole.   atorvastatin (LIPITOR) 20 MG tablet Take 1 tablet (20 mg total) by mouth daily.   calcium-vitamin D (OSCAL WITH D) 500-5 MG-MCG tablet Take 1 tablet by mouth 2 (two) times daily.   carboxymethylcellulose 1 % ophthalmic solution Place 1 drop into both eyes at bedtime.   Cholecalciferol (VITAMIN D3) 125 MCG (5000 UT) TABS Take 1 tablet (5,000 Units total) by mouth daily.   citalopram (CELEXA) 20 MG tablet Take 20 mg by mouth daily.   Cranberry 450 MG  TABS Take 1 tablet by mouth every morning.   famotidine (PEPCID) 20 MG tablet Take 20 mg by mouth at bedtime.   HYDROcodone-acetaminophen (NORCO/VICODIN) 5-325 MG tablet Take 1 tablet by mouth every 6 (six) hours as needed.   hydrocortisone cream 1 % Apply 1 Application topically 2 (two) times daily as needed for itching.   ketorolac (ACULAR) 0.5 % ophthalmic solution Place 1 drop into the left eye 4 (four) times daily.   levothyroxine (SYNTHROID) 100 MCG tablet Take 1 tablet (100 mcg total) by mouth daily before breakfast.   losartan (COZAAR) 25 MG tablet Take 0.5 tablets (12.5 mg total) by mouth daily.   melatonin 5 MG TABS Take 1 tablet (5 mg total) by mouth at bedtime.   montelukast (SINGULAIR) 10 MG tablet Take 1 tablet (10 mg total) by mouth at bedtime.   moxifloxacin (VIGAMOX) 0.5 % ophthalmic solution Place 1 drop into the right eye 4 (four) times daily.   nystatin powder Apply 1 Application topically every 6 (six) hours as needed (groin rash).   omeprazole (PRILOSEC) 40 MG capsule Take 1 capsule (40 mg total) by mouth daily. After lunch   ondansetron (ZOFRAN) 4 MG tablet Take 1 tablet (4 mg total) by mouth every 8 (eight) hours as needed for nausea or vomiting.   oxybutynin (DITROPAN) 5 MG tablet Take 1 tablet (5 mg total) by  mouth every 8 (eight) hours as needed for bladder spasms.   OXYGEN Inhale into the lungs. Uses for exertion   polyethylene glycol (MIRALAX / GLYCOLAX) 17 g packet Take 17 g by mouth daily.   potassium chloride SA (KLOR-CON M) 20 MEQ tablet Take 20 mEq by mouth 2 (two) times daily.   prednisoLONE acetate (PRED FORTE) 1 % ophthalmic suspension Place 1 drop into the left eye 4 (four) times daily.   pregabalin (LYRICA) 150 MG capsule Take 150 mg by mouth at bedtime.   senna-docusate (SENOKOT-S) 8.6-50 MG tablet Take 2 tablets by mouth at bedtime.   simethicone (MYLICON) 80 MG chewable tablet Chew 80 mg by mouth every 6 (six) hours as needed for flatulence.   traZODone (DESYREL) 100 MG tablet Take 100 mg by mouth at bedtime.   venlafaxine (EFFEXOR) 75 MG tablet Take 75 mg by mouth 2 (two) times daily.   [DISCONTINUED] midodrine (PROAMATINE) 5 MG tablet Take 1 tablet (5 mg total) by mouth 3 (three) times daily with meals.   [DISCONTINUED] torsemide (DEMADEX) 20 MG tablet Take 1 tablet (20 mg total) by mouth daily.     Allergies:   Vibegron   Social History   Socioeconomic History   Marital status: Widowed    Spouse name: Not on file   Number of children: Not on file   Years of education: Not on file   Highest education level: Not on file  Occupational History   Not on file  Tobacco Use   Smoking status: Never    Passive exposure: Yes   Smokeless tobacco: Never   Tobacco comments:    husbands and children smoked in home.   Vaping Use   Vaping Use: Never used  Substance and Sexual Activity   Alcohol use: No   Drug use: No   Sexual activity: Never  Other Topics Concern   Not on file  Social History Narrative   Widowed    Lives at [facility]   Has kids and grandson    Social Determinants of Health   Financial Resource Strain: Low Risk  (07/24/2020)  Overall Financial Resource Strain (CARDIA)    Difficulty of Paying Living Expenses: Not hard at all  Food Insecurity: No  Food Insecurity (10/23/2022)   Hunger Vital Sign    Worried About Running Out of Food in the Last Year: Never true    Ran Out of Food in the Last Year: Never true  Transportation Needs: No Transportation Needs (10/23/2022)   PRAPARE - Administrator, Civil Service (Medical): No    Lack of Transportation (Non-Medical): No  Physical Activity: Unknown (07/24/2020)   Exercise Vital Sign    Days of Exercise per Week: 0 days    Minutes of Exercise per Session: Not on file  Stress: No Stress Concern Present (07/24/2020)   [employer] of Occupational Health - Occupational Stress Questionnaire    Feeling of Stress : Only a little  Social Connections: Unknown (07/24/2020)   Social Connection and Isolation Panel [NHANES]    Frequency of Communication with Friends and Family: More than three times a week    Frequency of Social Gatherings with Friends and Family: More than three times a week    Attends Religious Services: Not on Marketing executive or Organizations: Not on file    Attends Banker Meetings: Not on file    Marital Status: Not on file     Family History: The patient's family history includes Breast cancer in her maternal aunt; Diabetes in her mother; Heart attack in her brother; Heart attack (age of onset: 61) in her mother; Heart attack (age of onset: 81) in her father; Heart disease in her father and mother; Hypertension in her mother.  ROS:   Please see the history of present illness.     All other systems reviewed and are negative.  EKGs/Labs/Other Studies Reviewed:    The following studies were reviewed today:  Echo limited 02/2022 1. Left ventricular ejection fraction, by estimation, is 45 to 50%. The  left ventricle has mildly decreased function. The left ventricle has no  regional wall motion abnormalities. Left ventricular diastolic parameters  are indeterminate. The average left   ventricular global longitudinal strain is -16.8  %.   2. Right ventricular systolic function is normal. The right ventricular  size is normal. There is normal pulmonary artery systolic pressure.   3. The mitral valve is normal in structure. No evidence of mitral valve  regurgitation. No evidence of mitral stenosis.   4. The aortic valve is normal in structure. Aortic valve regurgitation is  not visualized. No aortic stenosis is present.   5. The inferior vena cava is normal in size with greater than 50%  respiratory variability, suggesting right atrial pressure of 3 mmHg.   Echo limited 10/2021  1. Left ventricular ejection fraction, by estimation, is 30 to 35%. The  left ventricle has moderately decreased function. The left ventricle  demonstrates global hypokinesis. The left ventricular internal cavity size  was mildly dilated.   2. Right ventricular systolic function is normal. The right ventricular  size is normal.   3. A smll to moderate pericardial effusion is present, 1 14 up to 1.68 cm  off the LV free wall, 0.66 cm off tyhe RV free wall. The pericardial  effusion is circumferential. There is no evidence of cardiac tamponade.   4. The mitral valve is normal in structure. No evidence of mitral valve  regurgitation. No evidence of mitral stenosis.   5. The aortic valve is normal in structure. Aortic  valve regurgitation is  not visualized. Aortic valve sclerosis/calcification is present, without  any evidence of aortic stenosis.   6. The inferior vena cava is normal in size with greater than 50%  respiratory variability, suggesting right atrial pressure of 3 mmHg.   Comparison(s): LVEF 30-35%, Moderate pericardial effusion.   R/L heart cath 08/2021   Prox RCA lesion is 20% stenosed.   1.  Mild nonobstructive coronary artery disease. 2.  Left ventricular angiography was not performed.  EF was 30 to 35% by echo. 3.  Right heart catheterization showed mildly elevated right and left filling pressures, mild pulmonary hypertension  and mildly reduced cardiac output.   Recommendations: Recommend medical therapy for nonischemic cardiomyopathy. The patient continues to be mildly volume overloaded.  We will continue IV furosemide tonight and switch to oral furosemide tomorrow morning.    EKG:  EKG is not ordered today.    Recent Labs: 04/11/2022: TSH 1.278 04/12/2022: Magnesium 2.2 04/16/2022: B Natriuretic Peptide 159.1 10/23/2022: ALT 15 10/25/2022: BUN 8; Creatinine, Ser 0.87; Potassium 4.1; Sodium 143 10/28/2022: Hemoglobin 11.4; Platelets 142  Recent Lipid Panel    Component Value Date/Time   CHOL 131 09/19/2022 0948   CHOL 119 08/22/2017 0920   TRIG 132 09/19/2022 0948   HDL 46 09/19/2022 0948   HDL 57 08/22/2017 0920   CHOLHDL 2.8 09/19/2022 0948   VLDL 26 09/19/2022 0948   LDLCALC 59 09/19/2022 0948   LDLCALC 46 08/22/2017 0920   Physical Exam:    VS:  BP 94/74 (BP Location: Left Arm, Patient Position: Standing, Cuff Size: Large)   Pulse 82   Ht 5\' 4"  (1.626 m)   Wt 192 lb 6.4 oz (87.3 kg)   SpO2 98%   BMI 33.03 kg/m     Wt Readings from Last 3 Encounters:  12/05/22 192 lb 6.4 oz (87.3 kg)  11/19/22 170 lb (77.1 kg)  11/13/22 170 lb 13.7 oz (77.5 kg)     GEN:  Well nourished, well developed in no acute distress HEENT: Normal NECK: No JVD; No carotid bruits LYMPHATICS: No lymphadenopathy CARDIAC: RRR, no murmurs, rubs, gallops RESPIRATORY:  Clear to auscultation without rales, wheezing or rhonchi  ABDOMEN: Soft, non-tender, non-distended MUSCULOSKELETAL:  No edema; No deformity  SKIN: Warm and dry NEUROLOGIC:  Alert and oriented x 3 PSYCHIATRIC:  Normal affect   ASSESSMENT:    1. Dizziness   2. Essential hypertension   3. Chronic systolic heart failure (HCC)   4. Coronary artery disease of native artery of native heart with stable angina pectoris (HCC)   5. Hyperlipidemia, mixed    PLAN:    In order of problems listed above:  Dizziness H/o HTN Patient is hypotensive today 94/74.  She is in a wheelchair. Unable to do complete orthostatics. There is a drop from sitting to standing. The patient is overall feeling poorly. She reports she is dizzy most of the time and has many falls. I will stop Torsemide and increase midodrine to 10mg  TID. She is on low dose Losartan for cardiomyopathy. We will re-evaluate her symptoms in 2-3 weeks.   Chronic HFrEF The patient appears euvolemic on exam. I am stopping Torsemide and increasing midodrine as above. Continue Losartan. May start SGLT2i at follow-up.   CAD The patient denies anginal symptoms. LHC 08/2021 showed nonobstructive CAD. No further ischemic work-up at this time. Continue Aspirin 81mg  daily and Lipitor 20mg  daily.   HLD LDL 59 in 09/2022. Continue Lipitor 20mg  daily.   Disposition: Follow up  in 2-3 week(s) with MD/APP    Signed, Jakyren Fluegge David Stall, PA-C  12/05/2022 11:41 AM    [facility]

## 2022-12-05 ENCOUNTER — Ambulatory Visit: Payer: Medicare HMO | Attending: Cardiology | Admitting: Medical

## 2022-12-05 ENCOUNTER — Encounter: Payer: Self-pay | Admitting: Medical

## 2022-12-05 VITALS — BP 94/74 | HR 82 | Ht 64.0 in | Wt 192.4 lb

## 2022-12-05 DIAGNOSIS — I5022 Chronic systolic (congestive) heart failure: Secondary | ICD-10-CM

## 2022-12-05 DIAGNOSIS — I25118 Atherosclerotic heart disease of native coronary artery with other forms of angina pectoris: Secondary | ICD-10-CM | POA: Diagnosis not present

## 2022-12-05 DIAGNOSIS — I10 Essential (primary) hypertension: Secondary | ICD-10-CM

## 2022-12-05 DIAGNOSIS — E782 Mixed hyperlipidemia: Secondary | ICD-10-CM

## 2022-12-05 DIAGNOSIS — R42 Dizziness and giddiness: Secondary | ICD-10-CM | POA: Diagnosis not present

## 2022-12-05 MED ORDER — MIDODRINE HCL 10 MG PO TABS: 10.0000 mg | ORAL_TABLET | Freq: Three times a day (TID) | ORAL | 0 refills | Status: DC

## 2022-12-05 NOTE — Patient Instructions (Signed)
Medication Instructions:  Your physician has recommended you make the following change in your medication:   START - midodrine (PROAMATINE) 10 MG tablet - Take 1 tablet (10 mg total) by mouth 3 (three) times daily with meals  STOP - torsemide (DEMADEX) 20 MG tablet  *If you need a refill on your cardiac medications before your next appointment, please call your pharmacy*   Lab Work: -None ordered If you have labs (blood work) drawn today and your tests are completely normal, you will receive your results only by: MyChart Message (if you have MyChart) OR A paper copy in the mail If you have any lab test that is abnormal or we need to change your treatment, we will call you to review the results.   Testing/Procedures: -None ordered   Follow-Up: At [facility], you and your health needs are our priority.  As part of our continuing mission to provide you with exceptional heart care, we have created designated Provider Care Teams.  These Care Teams include your primary Cardiologist (physician) and Advanced Practice Providers (APPs -  Physician Assistants and Nurse Practitioners) who all work together to provide you with the care you need, when you need it.  We recommend signing up for the patient portal called "MyChart".  Sign up information is provided on this After Visit Summary.  MyChart is used to connect with patients for Virtual Visits (Telemedicine).  Patients are able to view lab/test results, encounter notes, upcoming appointments, etc.  Non-urgent messages can be sent to your provider as well.   To learn more about what you can do with MyChart, go to [email or web address].    Your next appointment:   3 week(s)  Provider:   You may see Yvonne Kendall, MD or one of the following Advanced Practice Providers on your designated Care Team:   Nicolasa Ducking, NP Eula Listen, PA-C Cadence Fransico Michael, PA-C Charlsie Quest, NP    Other Instructions -None

## 2022-12-09 DIAGNOSIS — G8929 Other chronic pain: Secondary | ICD-10-CM | POA: Diagnosis not present

## 2022-12-11 DIAGNOSIS — F32A Depression, unspecified: Secondary | ICD-10-CM | POA: Diagnosis not present

## 2022-12-11 DIAGNOSIS — F411 Generalized anxiety disorder: Secondary | ICD-10-CM | POA: Diagnosis not present

## 2022-12-12 ENCOUNTER — Ambulatory Visit: Payer: Self-pay

## 2022-12-12 NOTE — Patient Outreach (Signed)
  Care Coordination   12/12/2022 Name: Kimberly Cook MRN: [id number] DOB: 1946-04-19   Care Coordination Outreach Attempts:  An unsuccessful telephone outreach was attempted today to offer the patient information about available care coordination services.  Follow Up Plan:  Additional outreach attempts will be made to offer the patient care coordination information and services.   Encounter Outcome:  No Answer   Care Coordination Interventions:  No, not indicated    SIG Lysle Morales, BSW Social Worker [facility] Management  [phone]

## 2022-12-17 DIAGNOSIS — F419 Anxiety disorder, unspecified: Secondary | ICD-10-CM | POA: Diagnosis not present

## 2022-12-24 DIAGNOSIS — G629 Polyneuropathy, unspecified: Secondary | ICD-10-CM | POA: Diagnosis not present

## 2022-12-25 DIAGNOSIS — F4323 Adjustment disorder with mixed anxiety and depressed mood: Secondary | ICD-10-CM | POA: Diagnosis not present

## 2022-12-27 NOTE — Progress Notes (Unsigned)
Referring Physician:  No referring provider defined for this encounter.  Primary Physician:  System, Provider Not In  History of Present Illness: 12/31/2022 [name] has a history of heart failure, CKD 2, CAD, HTN, GERD, hyperlipidemia, hypothyroidism, nonischemic cardiomyopathy, stroke.   She is in O2 at night and prn. Has not needed it in a long time.   She has a history of T4-pelvis fusion with new T7 compression fracture s/p fall on 10/19/22. Was last seen by me on 11/19/22 and she was doing well. She is at a SNF.  No brace needed. She was to avoid any bending, twisting, or lifting.   She is here for follow up and repeat xrays.   She has no thoracic or mid back pain. She notes some pain in her lower back that is intermittent and sharp. No current leg pain. She has some chronic numbness/tingling in left leg. No new numbness, tingling, or weakness.   She is not in PT at [facility]. Thinks she used all the visits her insurance would approve.   Bowel/Bladder Dysfunction: none  Past Surgery:  01/12/21 and 01/15/21 - XLIFs and T4-pelvis by Dr. Myer Haff SCS placement and removal in 2021  Review of Systems:  A 10 point review of systems is negative, except for the pertinent positives and negatives detailed in the HPI.  Past Medical History: Past Medical History:  Diagnosis Date   Acute respiratory failure, unsp w hypoxia or hypercapnia (HCC)    Allergy    Anxiety    a.) on BZO (alprazolam) PRN   Aortic atherosclerosis (HCC)    Arthritis    Back pain    Chronic HFrEF (heart failure with reduced ejection fraction) (HCC)    a. 08/2021 echo: EF 30-35%, glob HK, GrI DD; b. 10/2021 Echo: EF 30-35%.   CKD (chronic kidney disease), stage II    Complication of anesthesia    Slow to clear meds after "14 hour back surgery"   Coronary artery disease    a. Mild to moderate CAD in LAD/diagonal by CTA (CT-FFR of apical LAD 0.79); b. 08/2021 Cath: LM nl, LAD min irregs, D1/2/3 nl, LCX nl,  OM2/3 nl, RCA 20p, RPDA mild dzs, RPAV nl.   DDD (degenerative disc disease), lumbar    Depression    Diverticulosis    Essential hypertension    GERD (gastroesophageal reflux disease)    Headache    Hiatal hernia    History of shingles    HLD (hyperlipidemia)    Hypothyroidism    Lung nodule    Mini stroke 2011   Moderate Pericardial effusion    a. 08/2021 Echo: moderate circumferential pericardial effusion w/o tamponade; b. 10/2021 Echo: EF 30-35%, small to mod circumferential pericardial effusion w/o tamponade.   Muscle weakness    NICM (nonischemic cardiomyopathy) (HCC)    a. 08/2021 Echo: EF 30-35%, glob HK, GrI DD, nl RV fxn, mild-mod dil LA, mod circumferential pericardial eff w/o tamponade, Mod MR; b. 10/2021 Echo: EF 30-35%, glob HK.   Occipital neuralgia    On supplemental oxygen by nasal cannula    a.) 2L.Deuel at bedtime and PRN   Palpitations    Pancytopenia (HCC)    Pleural effusion on left    a. 08/2021 s/p thoracentesis.   Pneumonia 2018   Polyneuropathy    Prediabetes    Renal cyst, left    a.) Renal artery [place] 12/26/2017: measured 2.1 cm.   Sleep difficulties    a.) takes trazodone + melatonin  Stroke [facility])    MRI 04/2008 + left sup. frontal gyrus possibly puntate infarct    Syncope 2019   T7 vertebral fracture (HCC) 10/22/2022   Uses wheelchair    able to stand and self transfer   Vitamin B deficiency    Vitamin D deficiency     Past Surgical History: Past Surgical History:  Procedure Laterality Date   ABDOMINAL HYSTERECTOMY     BLADDER SURGERY     2003   BREAST EXCISIONAL BIOPSY Right Over 20 years    Benign   CATARACT EXTRACTION W/PHACO Right 10/09/2022   Procedure: CATARACT EXTRACTION PHACO AND INTRAOCULAR LENS PLACEMENT (IOC) RIGHT  8.13  00:43.5;  Surgeon: Lockie Mola, MD;  Location: [facility];  Service: Ophthalmology;  Laterality: Right;   CATARACT EXTRACTION W/PHACO Left 11/13/2022   Procedure: CATARACT EXTRACTION PHACO AND  INTRAOCULAR LENS PLACEMENT (IOC) LEFT;  Surgeon: Lockie Mola, MD;  Location: [facility];  Service: Ophthalmology;  Laterality: Left;  11.47 0:51.7   CHOLECYSTECTOMY     CYSTOSCOPY W/ URETERAL STENT PLACEMENT Left 05/13/2022   Procedure: CYSTOSCOPY WITH RETROGRADE PYELOGRAM/URETERAL LEFT STENT PLACEMENT;  Surgeon: Vanna Scotland, MD;  Location: [facility];  Service: Urology;  Laterality: Left;   CYSTOSCOPY WITH BIOPSY N/A 05/13/2022   Procedure: CYSTOSCOPY WITH BLADDER BIOPSY;  Surgeon: Vanna Scotland, MD;  Location: [facility];  Service: Urology;  Laterality: N/A;   gastroplication      KNEE ARTHROSCOPY Left 2011   PULSE GENERATOR IMPLANT Left 01/31/2020   Procedure: PLACEMENT RIGHT FLANK PULSE GENERATOR VS REMOVAL SPINAL CORD STIMULATOR;  Surgeon: Lucy Chris, MD;  Location: [facility];  Service: Neurosurgery;  Laterality: Left;   PULSE GENERATOR IMPLANT Left 04/24/2020   Procedure: REPLACEMENT LEFT FLANK PULSE GENERATOR IMPLANT;  Surgeon: Lucy Chris, MD;  Location: [facility];  Service: Neurosurgery;  Laterality: Left;  MAC w/ local   REPLACEMENT TOTAL KNEE Left    right arm fracture     RIGHT/LEFT HEART CATH AND CORONARY ANGIOGRAPHY N/A 08/28/2021   Procedure: RIGHT/LEFT HEART CATH AND CORONARY ANGIOGRAPHY;  Surgeon: Iran Ouch, MD;  Location: [facility];  Service: Cardiovascular;  Laterality: N/A;   SPINAL CORD STIMULATOR REMOVAL N/A 06/26/2020   Procedure: SPINAL CORD STIMULATOR REMOVAL;  Surgeon: Lucy Chris, MD;  Location: [facility];  Service: Neurosurgery;  Laterality: N/A;   THORACIC LAMINECTOMY FOR SPINAL CORD STIMULATOR N/A 01/24/2020   Procedure: THORACIC SPINAL CORD STIMULATOR PADDLE TRIAL VIA LAMINECTOMY;  Surgeon: Lucy Chris, MD;  Location: [facility];  Service: Neurosurgery;  Laterality: N/A;   TONSILLECTOMY AND ADENOIDECTOMY     TOTAL KNEE ARTHROPLASTY Left 04/17/2015   Procedure: LEFT TOTAL KNEE ARTHROPLASTY;  Surgeon: Durene Romans, MD;   Location: [facility];  Service: Orthopedics;  Laterality: Left;    Allergies: Allergies as of 12/31/2022 - Review Complete 12/31/2022  Allergen Reaction Noted   Vibegron Other (See Comments) 07/30/2022    Medications: Outpatient Encounter Medications as of 12/31/2022  Medication Sig   acetaminophen (TYLENOL) 500 MG tablet Take 650 mg by mouth every 8 (eight) hours as needed for moderate pain.   alendronate (FOSAMAX) 70 MG tablet Take 70 mg by mouth once a week.    ALPRAZolam (XANAX) 0.5 MG tablet Take 0.5 mg by mouth every 6 (six) hours as needed for anxiety.   aspirin EC 81 MG tablet Take 1 tablet (81 mg total) by mouth at bedtime. Swallow whole.   atorvastatin (LIPITOR) 20 MG tablet Take 1 tablet (20 mg total)  by mouth daily.   carboxymethylcellulose 1 % ophthalmic solution Place 1 drop into both eyes at bedtime.   cetirizine (ZYRTEC) 5 MG tablet Take 5 mg by mouth daily.   Cholecalciferol (VITAMIN D3) 125 MCG (5000 UT) TABS Take 1 tablet (5,000 Units total) by mouth daily.   citalopram (CELEXA) 20 MG tablet Take 20 mg by mouth daily.   Cranberry 450 MG TABS Take 1 tablet by mouth every morning.   diclofenac Sodium (VOLTAREN) 1 % GEL Apply 2 g topically 4 (four) times daily.   famotidine (PEPCID) 20 MG tablet Take 20 mg by mouth at bedtime.   HYDROcodone-acetaminophen (NORCO/VICODIN) 5-325 MG tablet Take 1 tablet by mouth every 6 (six) hours as needed.   hydrocortisone cream 1 % Apply 1 Application topically 2 (two) times daily as needed for itching.   levothyroxine (SYNTHROID) 100 MCG tablet Take 1 tablet (100 mcg total) by mouth daily before breakfast.   losartan (COZAAR) 25 MG tablet Take 0.5 tablets (12.5 mg total) by mouth daily.   melatonin 5 MG TABS Take 1 tablet (5 mg total) by mouth at bedtime.   midodrine (PROAMATINE) 10 MG tablet Take 1 tablet (10 mg total) by mouth 3 (three) times daily with meals.   montelukast (SINGULAIR) 10 MG tablet Take 1 tablet (10 mg total) by mouth at  bedtime.   nystatin powder Apply 1 Application topically every 6 (six) hours as needed (groin rash).   omeprazole (PRILOSEC) 40 MG capsule Take 1 capsule (40 mg total) by mouth daily. After lunch   ondansetron (ZOFRAN) 4 MG tablet Take 1 tablet (4 mg total) by mouth every 8 (eight) hours as needed for nausea or vomiting.   oxybutynin (DITROPAN) 5 MG tablet Take 1 tablet (5 mg total) by mouth every 8 (eight) hours as needed for bladder spasms.   polyethylene glycol (MIRALAX / GLYCOLAX) 17 g packet Take 17 g by mouth daily.   potassium chloride SA (KLOR-CON M) 20 MEQ tablet Take 20 mEq by mouth 2 (two) times daily.   prednisoLONE acetate (PRED FORTE) 1 % ophthalmic suspension Place 1 drop into the left eye 4 (four) times daily.   pregabalin (LYRICA) 150 MG capsule Take 150 mg by mouth at bedtime.   pregabalin (LYRICA) 75 MG capsule Take 75 mg by mouth every morning.   senna-docusate (SENOKOT-S) 8.6-50 MG tablet Take 2 tablets by mouth at bedtime.   simethicone (MYLICON) 80 MG chewable tablet Chew 80 mg by mouth every 6 (six) hours as needed for flatulence.   traZODone (DESYREL) 100 MG tablet Take 100 mg by mouth at bedtime.   venlafaxine (EFFEXOR) 75 MG tablet Take 75 mg by mouth 2 (two) times daily.   calcium-vitamin D (OSCAL WITH D) 500-5 MG-MCG tablet Take 1 tablet by mouth 2 (two) times daily.   ketorolac (ACULAR) 0.5 % ophthalmic solution Place 1 drop into the left eye 4 (four) times daily.   moxifloxacin (VIGAMOX) 0.5 % ophthalmic solution Place 1 drop into the right eye 4 (four) times daily.   OXYGEN Inhale into the lungs. Uses for exertion   No facility-administered encounter medications on file as of 12/31/2022.    Social History: Social History   Tobacco Use   Smoking status: Never    Passive exposure: Yes   Smokeless tobacco: Never   Tobacco comments:    husbands and children smoked in home.   Vaping Use   Vaping Use: Never used  Substance Use Topics   Alcohol use: No  Drug use: No    Family Medical History: Family History  Problem Relation Age of Onset   Heart disease Mother    Hypertension Mother    Diabetes Mother    Heart attack Mother 61   Heart disease Father    Heart attack Father 64   Breast cancer Maternal Aunt    Heart attack Brother     Physical Examination: Vitals:   12/31/22 1008  BP: 130/72      Awake, alert, oriented to person, place, and time.  Speech is clear and fluent. Fund of knowledge is appropriate.   Cranial Nerves: Pupils equal round and reactive to light.  Facial tone is symmetric.    She has no thoracic tenderness.   No abnormal lesions on exposed skin.   Strength: Side Biceps Triceps Deltoid Interossei Grip Wrist Ext. Wrist Flex.  R 5 5 5 5 5 5 5   L 5 5 5 5 5 5 5    Side Iliopsoas Quads Hamstring PF DF EHL  R 5 5 5 5 5 5   L 5 5 5 5 5 5    Bilateral upper and lower extremity sensation is intact to light touch.     Gait is not tested. She is in a WC.   Medical Decision Making  Imaging: Thoracic xrays dated 12/31/22:  Hardware intact with no complications. Difficult to visualize T7 compression fracture but it appears stable.   Above radiology report not available.    Assessment and Plan: Ms. Ohlsson is a pleasant 77 y.o. female has history of T4-pelvis fusion with new T7 compression fracture s/p fall on 10/19/22. She has no current thoracic pain. She has intermittent LBP with no leg pain.   Xrays from today show T7 fracture is stable.   Treatment options discussed with patient and following plan made:   - No brace needed. She should avoid bending, twisting, or lifting (has not done any of this since her fusion).  - Recommend she start PT at SNF for progressive mobilization. Orders done.  - Follow up in 4 weeks with repeat xrays. Will need to come 1 hour prior to visit and to [facility]. If everything looks good at this visit, will release to follow up prn.   I spent a total of 15  minutes in face-to-face and non-face-to-face activities related to this patient's care today including review of outside records, review of imaging, review of symptoms, physical exam, discussion of differential diagnosis, discussion of treatment options, and documentation.   Drake Leach PA-C Dept. of Neurosurgery

## 2022-12-31 ENCOUNTER — Ambulatory Visit (INDEPENDENT_AMBULATORY_CARE_PROVIDER_SITE_OTHER): Payer: Medicare HMO | Admitting: Orthopedic Surgery

## 2022-12-31 ENCOUNTER — Ambulatory Visit
Admission: RE | Admit: 2022-12-31 | Discharge: 2022-12-31 | Disposition: A | Discharge status: Home / Self Care | Payer: Medicare HMO | Source: Ambulatory Visit | Attending: Orthopedic Surgery | Admitting: Orthopedic Surgery

## 2022-12-31 ENCOUNTER — Encounter: Payer: Self-pay | Admitting: Orthopedic Surgery

## 2022-12-31 VITALS — BP 130/72 | Ht 64.0 in | Wt 192.0 lb

## 2022-12-31 DIAGNOSIS — M4325 Fusion of spine, thoracolumbar region: Secondary | ICD-10-CM

## 2022-12-31 DIAGNOSIS — W19XXXD Unspecified fall, subsequent encounter: Secondary | ICD-10-CM

## 2022-12-31 DIAGNOSIS — S22068D Other fracture of T7-T8 thoracic vertebra, subsequent encounter for fracture with routine healing: Secondary | ICD-10-CM | POA: Diagnosis not present

## 2022-12-31 DIAGNOSIS — S22069D Unspecified fracture of T7-T8 vertebra, subsequent encounter for fracture with routine healing: Secondary | ICD-10-CM | POA: Diagnosis not present

## 2022-12-31 DIAGNOSIS — M4324 Fusion of spine, thoracic region: Secondary | ICD-10-CM | POA: Diagnosis not present

## 2023-01-06 DIAGNOSIS — F419 Anxiety disorder, unspecified: Secondary | ICD-10-CM | POA: Diagnosis not present

## 2023-01-06 DIAGNOSIS — G8929 Other chronic pain: Secondary | ICD-10-CM | POA: Diagnosis not present

## 2023-01-08 DIAGNOSIS — F4323 Adjustment disorder with mixed anxiety and depressed mood: Secondary | ICD-10-CM | POA: Diagnosis not present

## 2023-01-08 DIAGNOSIS — F32A Depression, unspecified: Secondary | ICD-10-CM | POA: Diagnosis not present

## 2023-01-08 DIAGNOSIS — F411 Generalized anxiety disorder: Secondary | ICD-10-CM | POA: Diagnosis not present

## 2023-01-14 ENCOUNTER — Encounter: Payer: Self-pay | Admitting: Medical

## 2023-01-14 ENCOUNTER — Ambulatory Visit: Payer: Medicare HMO | Attending: Medical | Admitting: Medical

## 2023-01-14 VITALS — BP 135/98 | HR 81 | Ht 63.0 in | Wt 188.4 lb

## 2023-01-14 DIAGNOSIS — E782 Mixed hyperlipidemia: Secondary | ICD-10-CM | POA: Diagnosis not present

## 2023-01-14 DIAGNOSIS — I2510 Atherosclerotic heart disease of native coronary artery without angina pectoris: Secondary | ICD-10-CM

## 2023-01-14 DIAGNOSIS — I959 Hypotension, unspecified: Secondary | ICD-10-CM

## 2023-01-14 DIAGNOSIS — I5022 Chronic systolic (congestive) heart failure: Secondary | ICD-10-CM | POA: Diagnosis not present

## 2023-01-14 NOTE — Progress Notes (Signed)
Cardiology Office Note:    Date:  01/14/2023   ID:  Kimberly Cook, DOB 18-Oct-1945, MRN [id number]  PCP:  System, Provider Not In  [facility] Cardiologist:  Yvonne Kendall, MD  [facility] Electrophysiologist:  None   Referring MD: No ref. provider found   Chief Complaint: 1 month follow-up  History of Present Illness:    Kimberly Cook is a 77 y.o. female with a hx of moderate CAD by cardiac CTA, chronic HFrEF, HTN, HLD, mini strokes, GERD, depression, and anxiety who presents for follow-up.    The patient was seen 08/2022 and was on Entresto and midodrine. This was switched to Losartan.    The patient was seen 09/19/22 and was volume overloaded and started on Torsemide 20mg  daily. Midodrine was decreased to 5mg  TID.  Patient was last seen in May 2024 reporting dizziness, worse when she stands up.  She was hypotensive, 94/74.  Torsemide was stopped and midodrine was increased to 10 mg 3 times daily.  Patient is on low-dose losartan for cardiomyopathy.  Today, the patient report she is feeling better. She is not feeling dizzy any more. No further falls. She denies chest pain. She reports a little swelling on her feet at times. Weight is overall down. Gets SOB when she does something, but patient is mostly in a wheelchair.   Past Medical History:  Diagnosis Date   Acute respiratory failure, unsp w hypoxia or hypercapnia (HCC)    Allergy    Anxiety    a.) on BZO (alprazolam) PRN   Aortic atherosclerosis (HCC)    Arthritis    Back pain    Chronic HFrEF (heart failure with reduced ejection fraction) (HCC)    a. 08/2021 echo: EF 30-35%, glob HK, GrI DD; b. 10/2021 Echo: EF 30-35%.   CKD (chronic kidney disease), stage II    Complication of anesthesia    Slow to clear meds after "14 hour back surgery"   Coronary artery disease    a. Mild to moderate CAD in LAD/diagonal by CTA (CT-FFR of apical LAD 0.79); b. 08/2021 Cath: LM nl, LAD min irregs, D1/2/3 nl, LCX nl, OM2/3 nl,  RCA 20p, RPDA mild dzs, RPAV nl.   DDD (degenerative disc disease), lumbar    Depression    Diverticulosis    Essential hypertension    GERD (gastroesophageal reflux disease)    Headache    Hiatal hernia    History of shingles    HLD (hyperlipidemia)    Hypothyroidism    Lung nodule    Mini stroke 2011   Moderate Pericardial effusion    a. 08/2021 Echo: moderate circumferential pericardial effusion w/o tamponade; b. 10/2021 Echo: EF 30-35%, small to mod circumferential pericardial effusion w/o tamponade.   Muscle weakness    NICM (nonischemic cardiomyopathy) (HCC)    a. 08/2021 Echo: EF 30-35%, glob HK, GrI DD, nl RV fxn, mild-mod dil LA, mod circumferential pericardial eff w/o tamponade, Mod MR; b. 10/2021 Echo: EF 30-35%, glob HK.   Occipital neuralgia    On supplemental oxygen by nasal cannula    a.) 2L.[place] at bedtime and PRN   Palpitations    Pancytopenia (HCC)    Pleural effusion on left    a. 08/2021 s/p thoracentesis.   Pneumonia 2018   Polyneuropathy    Prediabetes    Renal cyst, left    a.) Renal artery [place] 12/26/2017: measured 2.1 cm.   Sleep difficulties    a.) takes trazodone + melatonin  Stroke [facility])    MRI 04/2008 + left sup. frontal gyrus possibly puntate infarct    Syncope 2019   T7 vertebral fracture (HCC) 10/22/2022   Uses wheelchair    able to stand and self transfer   Vitamin B deficiency    Vitamin D deficiency     Past Surgical History:  Procedure Laterality Date   ABDOMINAL HYSTERECTOMY     BLADDER SURGERY     2003   BREAST EXCISIONAL BIOPSY Right Over 20 years    Benign   CATARACT EXTRACTION W/PHACO Right 10/09/2022   Procedure: CATARACT EXTRACTION PHACO AND INTRAOCULAR LENS PLACEMENT (IOC) RIGHT  8.13  00:43.5;  Surgeon: Lockie Mola, MD;  Location: [facility];  Service: Ophthalmology;  Laterality: Right;   CATARACT EXTRACTION W/PHACO Left 11/13/2022   Procedure: CATARACT EXTRACTION PHACO AND INTRAOCULAR LENS PLACEMENT (IOC) LEFT;   Surgeon: Lockie Mola, MD;  Location: [facility];  Service: Ophthalmology;  Laterality: Left;  11.47 0:51.7   CHOLECYSTECTOMY     CYSTOSCOPY W/ URETERAL STENT PLACEMENT Left 05/13/2022   Procedure: CYSTOSCOPY WITH RETROGRADE PYELOGRAM/URETERAL LEFT STENT PLACEMENT;  Surgeon: Vanna Scotland, MD;  Location: [facility] OR[facility];  Service: Urology;  Laterality: Left;   CYSTOSCOPY WITH BIOPSY N/A 05/13/2022   Procedure: CYSTOSCOPY WITH BLADDER BIOPSY;  Surgeon: Vanna Scotland, MD;  Location: [facility];  Service: Urology;  Laterality: N/A;   gastroplication      KNEE ARTHROSCOPY Left 2011   PULSE GENERATOR IMPLANT Left 01/31/2020   Procedure: PLACEMENT RIGHT FLANK PULSE GENERATOR VS REMOVAL SPINAL CORD STIMULATOR;  Surgeon: Lucy Chris, MD;  Location: [facility];  Service: Neurosurgery;  Laterality: Left;   PULSE GENERATOR IMPLANT Left 04/24/2020   Procedure: REPLACEMENT LEFT FLANK PULSE GENERATOR IMPLANT;  Surgeon: Lucy Chris, MD;  Location: [facility];  Service: Neurosurgery;  Laterality: Left;  MAC w/ local   REPLACEMENT TOTAL KNEE Left    right arm fracture     RIGHT/LEFT HEART CATH AND CORONARY ANGIOGRAPHY N/A 08/28/2021   Procedure: RIGHT/LEFT HEART CATH AND CORONARY ANGIOGRAPHY;  Surgeon: Iran Ouch, MD;  Location: [facility];  Service: Cardiovascular;  Laterality: N/A;   SPINAL CORD STIMULATOR REMOVAL N/A 06/26/2020   Procedure: SPINAL CORD STIMULATOR REMOVAL;  Surgeon: Lucy Chris, MD;  Location: [facility];  Service: Neurosurgery;  Laterality: N/A;   THORACIC LAMINECTOMY FOR SPINAL CORD STIMULATOR N/A 01/24/2020   Procedure: THORACIC SPINAL CORD STIMULATOR PADDLE TRIAL VIA LAMINECTOMY;  Surgeon: Lucy Chris, MD;  Location: [facility];  Service: Neurosurgery;  Laterality: N/A;   TONSILLECTOMY AND ADENOIDECTOMY     TOTAL KNEE ARTHROPLASTY Left 04/17/2015   Procedure: LEFT TOTAL KNEE ARTHROPLASTY;  Surgeon: Durene Romans, MD;  Location: [facility];  Service: Orthopedics;   Laterality: Left;    Current Medications: Current Meds  Medication Sig   acetaminophen (TYLENOL) 500 MG tablet Take 500 mg by mouth every 8 (eight) hours as needed for moderate pain.   alendronate (FOSAMAX) 70 MG tablet Take 70 mg by mouth once a week.    ALPRAZolam (XANAX) 0.5 MG tablet Take 0.5 mg by mouth every 6 (six) hours as needed for anxiety.   aspirin EC 81 MG tablet Take 1 tablet (81 mg total) by mouth at bedtime. Swallow whole.   atorvastatin (LIPITOR) 20 MG tablet Take 1 tablet (20 mg total) by mouth daily.   Calcium-Vitamin D (OYSTER SHELL/D PO) Take 1 tablet by mouth 2 (two) times daily.   carboxymethylcellulose 1 % ophthalmic solution Place 1 drop  into both eyes at bedtime.   cetirizine (ZYRTEC) 5 MG tablet Take 5 mg by mouth daily.   Cholecalciferol (VITAMIN D3) 125 MCG (5000 UT) TABS Take 1 tablet (5,000 Units total) by mouth daily.   citalopram (CELEXA) 20 MG tablet Take 20 mg by mouth daily.   Cranberry 450 MG TABS Take 1 tablet by mouth every morning.   diclofenac Sodium (VOLTAREN) 1 % GEL Apply 2 g topically 4 (four) times daily.   famotidine (PEPCID) 20 MG tablet Take 20 mg by mouth at bedtime.   HYDROcodone-acetaminophen (NORCO/VICODIN) 5-325 MG tablet Take 1 tablet by mouth every 6 (six) hours as needed.   hydrocortisone cream 1 % Apply 1 Application topically 2 (two) times daily as needed for itching.   levothyroxine (SYNTHROID) 100 MCG tablet Take 1 tablet (100 mcg total) by mouth daily before breakfast.   losartan (COZAAR) 25 MG tablet Take 0.5 tablets (12.5 mg total) by mouth daily.   melatonin 5 MG TABS Take 1 tablet (5 mg total) by mouth at bedtime.   midodrine (PROAMATINE) 10 MG tablet Take 1 tablet (10 mg total) by mouth 3 (three) times daily with meals.   montelukast (SINGULAIR) 10 MG tablet Take 1 tablet (10 mg total) by mouth at bedtime.   nystatin powder Apply 1 Application topically every 6 (six) hours as needed (groin rash).   omeprazole (PRILOSEC) 40  MG capsule Take 1 capsule (40 mg total) by mouth daily. After lunch   ondansetron (ZOFRAN) 4 MG tablet Take 1 tablet (4 mg total) by mouth every 8 (eight) hours as needed for nausea or vomiting.   oxybutynin (DITROPAN) 5 MG tablet Take 1 tablet (5 mg total) by mouth every 8 (eight) hours as needed for bladder spasms.   polyethylene glycol (MIRALAX / GLYCOLAX) 17 g packet Take 17 g by mouth daily.   potassium chloride SA (KLOR-CON M) 20 MEQ tablet Take 20 mEq by mouth 2 (two) times daily.   pregabalin (LYRICA) 150 MG capsule Take 150 mg by mouth at bedtime.   pregabalin (LYRICA) 75 MG capsule Take 75 mg by mouth every morning.   senna-docusate (SENOKOT-S) 8.6-50 MG tablet Take 2 tablets by mouth at bedtime.   simethicone (MYLICON) 80 MG chewable tablet Chew 80 mg by mouth every 6 (six) hours as needed for flatulence.   traZODone (DESYREL) 100 MG tablet Take 100 mg by mouth at bedtime.   venlafaxine (EFFEXOR) 75 MG tablet Take 75 mg by mouth 2 (two) times daily.     Allergies:   Vibegron   Social History   Socioeconomic History   Marital status: Widowed    Spouse name: Not on file   Number of children: Not on file   Years of education: Not on file   Highest education level: Not on file  Occupational History   Not on file  Tobacco Use   Smoking status: Never    Passive exposure: Yes   Smokeless tobacco: Never   Tobacco comments:    husbands and children smoked in home.   Vaping Use   Vaping Use: Never used  Substance and Sexual Activity   Alcohol use: No   Drug use: No   Sexual activity: Never  Other Topics Concern   Not on file  Social History Narrative   Widowed    Lives at [facility]   Has kids and grandson    Social Determinants of Health   Financial Resource Strain: Low Risk  (07/24/2020)  Overall Financial Resource Strain (CARDIA)    Difficulty of Paying Living Expenses: Not hard at all  Food Insecurity: No Food Insecurity (10/23/2022)   Hunger Vital Sign     Worried About Running Out of Food in the Last Year: Never true    Ran Out of Food in the Last Year: Never true  Transportation Needs: No Transportation Needs (10/23/2022)   PRAPARE - Administrator, Civil Service (Medical): No    Lack of Transportation (Non-Medical): No  Physical Activity: Unknown (07/24/2020)   Exercise Vital Sign    Days of Exercise per Week: 0 days    Minutes of Exercise per Session: Not on file  Stress: No Stress Concern Present (07/24/2020)   [employer] of Occupational Health - Occupational Stress Questionnaire    Feeling of Stress : Only a little  Social Connections: Unknown (07/24/2020)   Social Connection and Isolation Panel [NHANES]    Frequency of Communication with Friends and Family: More than three times a week    Frequency of Social Gatherings with Friends and Family: More than three times a week    Attends Religious Services: Not on Marketing executive or Organizations: Not on file    Attends Banker Meetings: Not on file    Marital Status: Not on file     Family History: The patient's family history includes Breast cancer in her maternal aunt; Diabetes in her mother; Heart attack in her brother; Heart attack (age of onset: 62) in her mother; Heart attack (age of onset: 75) in her father; Heart disease in her father and mother; Hypertension in her mother.  ROS:   Please see the history of present illness.     All other systems reviewed and are negative.  EKGs/Labs/Other Studies Reviewed:    The following studies were reviewed today:  Echo limited 02/2022 1. Left ventricular ejection fraction, by estimation, is 45 to 50%. The  left ventricle has mildly decreased function. The left ventricle has no  regional wall motion abnormalities. Left ventricular diastolic parameters  are indeterminate. The average left   ventricular global longitudinal strain is -16.8 %.   2. Right ventricular systolic function is  normal. The right ventricular  size is normal. There is normal pulmonary artery systolic pressure.   3. The mitral valve is normal in structure. No evidence of mitral valve  regurgitation. No evidence of mitral stenosis.   4. The aortic valve is normal in structure. Aortic valve regurgitation is  not visualized. No aortic stenosis is present.   5. The inferior vena cava is normal in size with greater than 50%  respiratory variability, suggesting right atrial pressure of 3 mmHg.    Echo limited 10/2021  1. Left ventricular ejection fraction, by estimation, is 30 to 35%. The  left ventricle has moderately decreased function. The left ventricle  demonstrates global hypokinesis. The left ventricular internal cavity size  was mildly dilated.   2. Right ventricular systolic function is normal. The right ventricular  size is normal.   3. A smll to moderate pericardial effusion is present, 1 14 up to 1.68 cm  off the LV free wall, 0.66 cm off tyhe RV free wall. The pericardial  effusion is circumferential. There is no evidence of cardiac tamponade.   4. The mitral valve is normal in structure. No evidence of mitral valve  regurgitation. No evidence of mitral stenosis.   5. The aortic valve is normal in structure.  Aortic valve regurgitation is  not visualized. Aortic valve sclerosis/calcification is present, without  any evidence of aortic stenosis.   6. The inferior vena cava is normal in size with greater than 50%  respiratory variability, suggesting right atrial pressure of 3 mmHg.   Comparison(s): LVEF 30-35%, Moderate pericardial effusion.    R/L heart cath 08/2021   Prox RCA lesion is 20% stenosed.   1.  Mild nonobstructive coronary artery disease. 2.  Left ventricular angiography was not performed.  EF was 30 to 35% by echo. 3.  Right heart catheterization showed mildly elevated right and left filling pressures, mild pulmonary hypertension and mildly reduced cardiac output.    Recommendations: Recommend medical therapy for nonischemic cardiomyopathy. The patient continues to be mildly volume overloaded.  We will continue IV furosemide tonight and switch to oral furosemide tomorrow morning.    EKG:  EKG is not ordered today.    Recent Labs: 04/11/2022: TSH 1.278 04/12/2022: Magnesium 2.2 04/16/2022: B Natriuretic Peptide 159.1 10/23/2022: ALT 15 10/25/2022: BUN 8; Creatinine, Ser 0.87; Potassium 4.1; Sodium 143 10/28/2022: Hemoglobin 11.4; Platelets 142  Recent Lipid Panel    Component Value Date/Time   CHOL 131 09/19/2022 0948   CHOL 119 08/22/2017 0920   TRIG 132 09/19/2022 0948   HDL 46 09/19/2022 0948   HDL 57 08/22/2017 0920   CHOLHDL 2.8 09/19/2022 0948   VLDL 26 09/19/2022 0948   LDLCALC 59 09/19/2022 0948   LDLCALC 46 08/22/2017 0920     Physical Exam:    VS:  BP (!) 135/98 (BP Location: Right Arm, Patient Position: Sitting, Cuff Size: Normal)   Pulse 81   Ht 5\' 3"  (1.6 m)   Wt 188 lb 6.4 oz (85.5 kg)   SpO2 96%   BMI 33.37 kg/m     Wt Readings from Last 3 Encounters:  01/14/23 188 lb 6.4 oz (85.5 kg)  12/31/22 192 lb (87.1 kg)  12/05/22 192 lb 6.4 oz (87.3 kg)     GEN:  Well nourished, well developed in no acute distress HEENT: Normal NECK: No JVD; No carotid bruits LYMPHATICS: No lymphadenopathy CARDIAC: RRR, no murmurs, rubs, gallops RESPIRATORY:  Clear to auscultation without rales, wheezing or rhonchi  ABDOMEN: Soft, non-tender, non-distended MUSCULOSKELETAL:  No edema; No deformity  SKIN: Warm and dry NEUROLOGIC:  Alert and oriented x 3 PSYCHIATRIC:  Normal affect   ASSESSMENT:    1. Hypotension, unspecified hypotension type   2. Chronic systolic heart failure (HCC)   3. Coronary artery disease involving native coronary artery of native heart without angina pectoris   4. Hyperlipidemia, mixed    PLAN:    In order of problems listed above:  Hypotension H/o HTN BP is better today on midodrine 10mg  TID. She takes  Losartan 12.5mg  daily for CM. She denies further dizziness orfalls, however she is mainly in a wheelchair at baseline.   Chronic HFrEF EF as low as 30-35% on echo 10/2021. Limited Echo 02/2022 showed LVEF 45-50%, normal RVSF. The patient appears euvolemic on exam. Torsemide stopped at the last visit. Continue Losartan 12.5mg  daily. No further changes at this time.   CAD LHC in 08/2021 showed nonobstructive CAD. The patient denies chest pain. She has DOE which is unchanged. Continue Aspirin and Lipitor 20mg  daily.   HLD LDL 59 in 09/2022. Continue Lipitor 20mg  daily.   Disposition: Follow up in 3 month(s) with MD    Signed, Teretha Chalupa David Stall, PA-C  01/14/2023 1:57 PM    [facility]

## 2023-01-14 NOTE — Patient Instructions (Signed)
Medication Instructions:  Continue taking medications as prescribed. *If you need a refill on your cardiac medications before your next appointment, please call your pharmacy*   Lab Work: none If you have labs (blood work) drawn today and your tests are completely normal, you will receive your results only by: MyChart Message (if you have MyChart) OR A paper copy in the mail If you have any lab test that is abnormal or we need to change your treatment, we will call you to review the results.   Testing/Procedures: none   Follow-Up: At [facility], you and your health needs are our priority.  As part of our continuing mission to provide you with exceptional heart care, we have created designated Provider Care Teams.  These Care Teams include your primary Cardiologist (physician) and Advanced Practice Providers (APPs -  Physician Assistants and Nurse Practitioners) who all work together to provide you with the care you need, when you need it.  We recommend signing up for the patient portal called "MyChart".  Sign up information is provided on this After Visit Summary.  MyChart is used to connect with patients for Virtual Visits (Telemedicine).  Patients are able to view lab/test results, encounter notes, upcoming appointments, etc.  Non-urgent messages can be sent to your provider as well.   To learn more about what you can do with MyChart, go to [email or web address].    Your next appointment:   3 month(s)  Provider:   You may see Yvonne Kendall, MD or one of the following Advanced Practice Providers on your designated Care Team:    Cadence [place], [place]

## 2023-01-20 DIAGNOSIS — G629 Polyneuropathy, unspecified: Secondary | ICD-10-CM | POA: Diagnosis not present

## 2023-01-24 DIAGNOSIS — R41841 Cognitive communication deficit: Secondary | ICD-10-CM | POA: Diagnosis not present

## 2023-01-24 DIAGNOSIS — M6281 Muscle weakness (generalized): Secondary | ICD-10-CM | POA: Diagnosis not present

## 2023-01-24 DIAGNOSIS — R262 Difficulty in walking, not elsewhere classified: Secondary | ICD-10-CM | POA: Diagnosis not present

## 2023-01-24 NOTE — Progress Notes (Unsigned)
Referring Physician:  No referring provider defined for this encounter.  Primary Physician:  System, Provider Not In  History of Present Illness: 01/28/2023 [name] has a history of heart failure, CKD 2, CAD, HTN, GERD, hyperlipidemia, hypothyroidism, nonischemic cardiomyopathy, stroke.   She is in O2 at night and prn. Has not needed it in a long time.   She has a history of T4-pelvis fusion with new T7 compression fracture s/p fall on 10/19/22. Was last seen by me on 11/19/22 and she was doing well. She is at a SNF.  No brace needed. She was to avoid any bending, twisting, or lifting.   She is here for follow up and repeat xrays.   She is a poor historian, but she notes overall increased pain in her mid and lower back that is intermittent. History of chronic numbness/tingling in left leg and she notes some intermittent lateral leg pain to her foot as well. No recent fall or injury noted.   She's not had PT in weeks and was told by SNF that she would restart PT this week.   Past Surgery:  01/12/21 and 01/15/21 - XLIFs and T4-pelvis by Dr. Myer Haff SCS placement and removal in 2021  Review of Systems:  A 10 point review of systems is negative, except for the pertinent positives and negatives detailed in the HPI.  Past Medical History: Past Medical History:  Diagnosis Date   Acute respiratory failure, unsp w hypoxia or hypercapnia (HCC)    Allergy    Anxiety    a.) on BZO (alprazolam) PRN   Aortic atherosclerosis (HCC)    Arthritis    Back pain    Chronic HFrEF (heart failure with reduced ejection fraction) (HCC)    a. 08/2021 echo: EF 30-35%, glob HK, GrI DD; b. 10/2021 Echo: EF 30-35%.   CKD (chronic kidney disease), stage II    Complication of anesthesia    Slow to clear meds after "14 hour back surgery"   Coronary artery disease    a. Mild to moderate CAD in LAD/diagonal by CTA (CT-FFR of apical LAD 0.79); b. 08/2021 Cath: LM nl, LAD min irregs, D1/2/3 nl, LCX  nl, OM2/3 nl, RCA 20p, RPDA mild dzs, RPAV nl.   DDD (degenerative disc disease), lumbar    Depression    Diverticulosis    Essential hypertension    GERD (gastroesophageal reflux disease)    Headache    Hiatal hernia    History of shingles    HLD (hyperlipidemia)    Hypothyroidism    Lung nodule    Mini stroke 2011   Moderate Pericardial effusion    a. 08/2021 Echo: moderate circumferential pericardial effusion w/o tamponade; b. 10/2021 Echo: EF 30-35%, small to mod circumferential pericardial effusion w/o tamponade.   Muscle weakness    NICM (nonischemic cardiomyopathy) (HCC)    a. 08/2021 Echo: EF 30-35%, glob HK, GrI DD, nl RV fxn, mild-mod dil LA, mod circumferential pericardial eff w/o tamponade, Mod MR; b. 10/2021 Echo: EF 30-35%, glob HK.   Occipital neuralgia    On supplemental oxygen by nasal cannula    a.) 2L.[place] at bedtime and PRN   Palpitations    Pancytopenia (HCC)    Pleural effusion on left    a. 08/2021 s/p thoracentesis.   Pneumonia 2018   Polyneuropathy    Prediabetes    Renal cyst, left    a.) Renal artery [place] 12/26/2017: measured 2.1 cm.   Sleep difficulties    a.)  takes trazodone + melatonin   Stroke (HCC)    MRI 04/2008 + left sup. frontal gyrus possibly puntate infarct    Syncope 2019   T7 vertebral fracture (HCC) 10/22/2022   Uses wheelchair    able to stand and self transfer   Vitamin B deficiency    Vitamin D deficiency     Past Surgical History: Past Surgical History:  Procedure Laterality Date   ABDOMINAL HYSTERECTOMY     BLADDER SURGERY     2003   BREAST EXCISIONAL BIOPSY Right Over 20 years    Benign   CATARACT EXTRACTION W/PHACO Right 10/09/2022   Procedure: CATARACT EXTRACTION PHACO AND INTRAOCULAR LENS PLACEMENT (IOC) RIGHT  8.13  00:43.5;  Surgeon: Lockie Mola, MD;  Location: [facility];  Service: Ophthalmology;  Laterality: Right;   CATARACT EXTRACTION W/PHACO Left 11/13/2022   Procedure: CATARACT EXTRACTION PHACO AND  INTRAOCULAR LENS PLACEMENT (IOC) LEFT;  Surgeon: Lockie Mola, MD;  Location: [facility];  Service: Ophthalmology;  Laterality: Left;  11.47 0:51.7   CHOLECYSTECTOMY     CYSTOSCOPY W/ URETERAL STENT PLACEMENT Left 05/13/2022   Procedure: CYSTOSCOPY WITH RETROGRADE PYELOGRAM/URETERAL LEFT STENT PLACEMENT;  Surgeon: Vanna Scotland, MD;  Location: [facility];  Service: Urology;  Laterality: Left;   CYSTOSCOPY WITH BIOPSY N/A 05/13/2022   Procedure: CYSTOSCOPY WITH BLADDER BIOPSY;  Surgeon: Vanna Scotland, MD;  Location: [facility];  Service: Urology;  Laterality: N/A;   gastroplication      KNEE ARTHROSCOPY Left 2011   PULSE GENERATOR IMPLANT Left 01/31/2020   Procedure: PLACEMENT RIGHT FLANK PULSE GENERATOR VS REMOVAL SPINAL CORD STIMULATOR;  Surgeon: Lucy Chris, MD;  Location: [facility];  Service: Neurosurgery;  Laterality: Left;   PULSE GENERATOR IMPLANT Left 04/24/2020   Procedure: REPLACEMENT LEFT FLANK PULSE GENERATOR IMPLANT;  Surgeon: Lucy Chris, MD;  Location: [facility];  Service: Neurosurgery;  Laterality: Left;  MAC w/ local   REPLACEMENT TOTAL KNEE Left    right arm fracture     RIGHT/LEFT HEART CATH AND CORONARY ANGIOGRAPHY N/A 08/28/2021   Procedure: RIGHT/LEFT HEART CATH AND CORONARY ANGIOGRAPHY;  Surgeon: Iran Ouch, MD;  Location: [facility];  Service: Cardiovascular;  Laterality: N/A;   SPINAL CORD STIMULATOR REMOVAL N/A 06/26/2020   Procedure: SPINAL CORD STIMULATOR REMOVAL;  Surgeon: Lucy Chris, MD;  Location: [facility];  Service: Neurosurgery;  Laterality: N/A;   THORACIC LAMINECTOMY FOR SPINAL CORD STIMULATOR N/A 01/24/2020   Procedure: THORACIC SPINAL CORD STIMULATOR PADDLE TRIAL VIA LAMINECTOMY;  Surgeon: Lucy Chris, MD;  Location: [facility];  Service: Neurosurgery;  Laterality: N/A;   TONSILLECTOMY AND ADENOIDECTOMY     TOTAL KNEE ARTHROPLASTY Left 04/17/2015   Procedure: LEFT TOTAL KNEE ARTHROPLASTY;  Surgeon: Durene Romans, MD;   Location: [facility];  Service: Orthopedics;  Laterality: Left;    Allergies: Allergies as of 01/28/2023 - Review Complete 01/28/2023  Allergen Reaction Noted   Vibegron Other (See Comments) 07/30/2022    Medications: Outpatient Encounter Medications as of 01/28/2023  Medication Sig   acetaminophen (TYLENOL) 500 MG tablet Take 500 mg by mouth every 8 (eight) hours as needed for moderate pain.   alendronate (FOSAMAX) 70 MG tablet Take 70 mg by mouth once a week.    ALPRAZolam (XANAX) 0.5 MG tablet Take 0.5 mg by mouth every 6 (six) hours as needed for anxiety.   aspirin EC 81 MG tablet Take 1 tablet (81 mg total) by mouth at bedtime. Swallow whole.   atorvastatin (LIPITOR) 20 MG tablet  Take 1 tablet (20 mg total) by mouth daily.   calcium-vitamin D (OSCAL WITH D) 500-5 MG-MCG tablet Take 1 tablet by mouth 2 (two) times daily.   Calcium-Vitamin D (OYSTER SHELL/D PO) Take 1 tablet by mouth 2 (two) times daily.   carboxymethylcellulose 1 % ophthalmic solution Place 1 drop into both eyes at bedtime.   cetirizine (ZYRTEC) 5 MG tablet Take 5 mg by mouth daily.   Cholecalciferol (VITAMIN D3) 125 MCG (5000 UT) TABS Take 1 tablet (5,000 Units total) by mouth daily.   citalopram (CELEXA) 20 MG tablet Take 20 mg by mouth daily.   Cranberry 450 MG TABS Take 1 tablet by mouth every morning.   diclofenac Sodium (VOLTAREN) 1 % GEL Apply 2 g topically 4 (four) times daily.   famotidine (PEPCID) 20 MG tablet Take 20 mg by mouth at bedtime.   HYDROcodone-acetaminophen (NORCO/VICODIN) 5-325 MG tablet Take 1 tablet by mouth every 6 (six) hours as needed.   hydrocortisone cream 1 % Apply 1 Application topically 2 (two) times daily as needed for itching.   ketorolac (ACULAR) 0.5 % ophthalmic solution Place 1 drop into the left eye 4 (four) times daily.   levothyroxine (SYNTHROID) 100 MCG tablet Take 1 tablet (100 mcg total) by mouth daily before breakfast.   losartan (COZAAR) 25 MG tablet Take 0.5 tablets (12.5 mg  total) by mouth daily.   melatonin 5 MG TABS Take 1 tablet (5 mg total) by mouth at bedtime.   midodrine (PROAMATINE) 10 MG tablet Take 1 tablet (10 mg total) by mouth 3 (three) times daily with meals.   montelukast (SINGULAIR) 10 MG tablet Take 1 tablet (10 mg total) by mouth at bedtime.   moxifloxacin (VIGAMOX) 0.5 % ophthalmic solution Place 1 drop into the right eye 4 (four) times daily.   nystatin powder Apply 1 Application topically every 6 (six) hours as needed (groin rash).   omeprazole (PRILOSEC) 40 MG capsule Take 1 capsule (40 mg total) by mouth daily. After lunch   ondansetron (ZOFRAN) 4 MG tablet Take 1 tablet (4 mg total) by mouth every 8 (eight) hours as needed for nausea or vomiting.   oxybutynin (DITROPAN) 5 MG tablet Take 1 tablet (5 mg total) by mouth every 8 (eight) hours as needed for bladder spasms.   OXYGEN Inhale into the lungs. Uses for exertion   polyethylene glycol (MIRALAX / GLYCOLAX) 17 g packet Take 17 g by mouth daily.   potassium chloride SA (KLOR-CON M) 20 MEQ tablet Take 20 mEq by mouth 2 (two) times daily.   prednisoLONE acetate (PRED FORTE) 1 % ophthalmic suspension Place 1 drop into the left eye 4 (four) times daily.   pregabalin (LYRICA) 150 MG capsule Take 150 mg by mouth at bedtime.   pregabalin (LYRICA) 75 MG capsule Take 75 mg by mouth every morning.   senna-docusate (SENOKOT-S) 8.6-50 MG tablet Take 2 tablets by mouth at bedtime.   simethicone (MYLICON) 80 MG chewable tablet Chew 80 mg by mouth every 6 (six) hours as needed for flatulence.   traZODone (DESYREL) 100 MG tablet Take 100 mg by mouth at bedtime.   venlafaxine (EFFEXOR) 75 MG tablet Take 75 mg by mouth 2 (two) times daily.   No facility-administered encounter medications on file as of 01/28/2023.    Social History: Social History   Tobacco Use   Smoking status: Never    Passive exposure: Yes   Smokeless tobacco: Never   Tobacco comments:    husbands and children  smoked in home.    Vaping Use   Vaping Use: Never used  Substance Use Topics   Alcohol use: No   Drug use: No    Family Medical History: Family History  Problem Relation Age of Onset   Heart disease Mother    Hypertension Mother    Diabetes Mother    Heart attack Mother 25   Heart disease Father    Heart attack Father 27   Breast cancer Maternal Aunt    Heart attack Brother     Physical Examination: Vitals:   01/28/23 1107  BP: 130/77       Awake, alert, oriented to person, place, and time.  Speech is clear and fluent. Fund of knowledge is appropriate.   Cranial Nerves: Pupils equal round and reactive to light.  Facial tone is symmetric.    She has no mid thoracic tenderness. She has tenderness at TL junction and mild lower lumbar tenderness.   No abnormal lesions on exposed skin.   Strength:  Side Iliopsoas Quads Hamstring PF DF EHL  R 5 5 5 5 5 5   L 5 5 5 5 5 5    Bilateral  lower extremity sensation is intact to light touch.     Gait is not tested. She is in a WC.   Medical Decision Making  Imaging: Thoracic xrays dated 01/28/23:  Hardware intact with no complications. Difficult to visualize T7 compression fracture but it appears stable.   Above radiology report not available.    Assessment and Plan: [name] is a pleasant 77 y.o. female has history of T4-pelvis fusion with new T7 compression fracture s/p fall on 10/19/22.   She notes overall increased pain in her mid and lower back that is intermittent. History of chronic numbness/tingling in left leg and she notes some intermittent lateral leg pain to her foot as well. No recent fall or injury noted.   Xrays from today show T7 fracture is stable.   Treatment options discussed with patient and following plan made:   - Agree with starting PT at SNF for progressive mobilization.  - Hopefully, she will see improvement in her pain with starting PT and being more mobile.  - Follow up with me in 6 weeks for recheck. If  no improvement, may need to get updated imaging.   I spent a total of 20 minutes in face-to-face and non-face-to-face activities related to this patient's care today including review of outside records, review of imaging, review of symptoms, physical exam, discussion of differential diagnosis, discussion of treatment options, and documentation.   Drake Leach PA-C Dept. of Neurosurgery

## 2023-01-28 ENCOUNTER — Ambulatory Visit
Admission: RE | Admit: 2023-01-28 | Discharge: 2023-01-28 | Disposition: A | Discharge status: Home / Self Care | Payer: Medicare HMO | Source: Ambulatory Visit | Attending: Orthopedic Surgery | Admitting: Orthopedic Surgery

## 2023-01-28 ENCOUNTER — Encounter: Payer: Self-pay | Admitting: Orthopedic Surgery

## 2023-01-28 ENCOUNTER — Ambulatory Visit
Admission: RE | Admit: 2023-01-28 | Discharge: 2023-01-28 | Disposition: A | Discharge status: Home / Self Care | Payer: Medicare HMO | Attending: Orthopedic Surgery | Admitting: Orthopedic Surgery

## 2023-01-28 ENCOUNTER — Ambulatory Visit (INDEPENDENT_AMBULATORY_CARE_PROVIDER_SITE_OTHER): Payer: Medicare HMO | Admitting: Orthopedic Surgery

## 2023-01-28 VITALS — BP 130/77 | Ht 64.0 in | Wt 188.0 lb

## 2023-01-28 DIAGNOSIS — W19XXXD Unspecified fall, subsequent encounter: Secondary | ICD-10-CM | POA: Diagnosis not present

## 2023-01-28 DIAGNOSIS — S22060D Wedge compression fracture of T7-T8 vertebra, subsequent encounter for fracture with routine healing: Secondary | ICD-10-CM

## 2023-01-28 DIAGNOSIS — S22069D Unspecified fracture of T7-T8 vertebra, subsequent encounter for fracture with routine healing: Secondary | ICD-10-CM | POA: Insufficient documentation

## 2023-01-28 DIAGNOSIS — M4325 Fusion of spine, thoracolumbar region: Secondary | ICD-10-CM

## 2023-01-28 DIAGNOSIS — Z981 Arthrodesis status: Secondary | ICD-10-CM | POA: Diagnosis not present

## 2023-01-28 DIAGNOSIS — S22060A Wedge compression fracture of T7-T8 vertebra, initial encounter for closed fracture: Secondary | ICD-10-CM | POA: Diagnosis not present

## 2023-01-28 DIAGNOSIS — R4189 Other symptoms and signs involving cognitive functions and awareness: Secondary | ICD-10-CM | POA: Diagnosis not present

## 2023-01-29 DIAGNOSIS — E785 Hyperlipidemia, unspecified: Secondary | ICD-10-CM | POA: Diagnosis not present

## 2023-01-29 DIAGNOSIS — I10 Essential (primary) hypertension: Secondary | ICD-10-CM | POA: Diagnosis not present

## 2023-01-29 DIAGNOSIS — M8000XA Age-related osteoporosis with current pathological fracture, unspecified site, initial encounter for fracture: Secondary | ICD-10-CM | POA: Diagnosis not present

## 2023-01-29 DIAGNOSIS — R52 Pain, unspecified: Secondary | ICD-10-CM | POA: Diagnosis not present

## 2023-01-29 DIAGNOSIS — K219 Gastro-esophageal reflux disease without esophagitis: Secondary | ICD-10-CM | POA: Diagnosis not present

## 2023-01-29 DIAGNOSIS — I639 Cerebral infarction, unspecified: Secondary | ICD-10-CM | POA: Diagnosis not present

## 2023-01-29 DIAGNOSIS — I5020 Unspecified systolic (congestive) heart failure: Secondary | ICD-10-CM | POA: Diagnosis not present

## 2023-01-29 DIAGNOSIS — F32A Depression, unspecified: Secondary | ICD-10-CM | POA: Diagnosis not present

## 2023-01-30 DIAGNOSIS — E119 Type 2 diabetes mellitus without complications: Secondary | ICD-10-CM | POA: Diagnosis not present

## 2023-01-30 DIAGNOSIS — R4182 Altered mental status, unspecified: Secondary | ICD-10-CM | POA: Diagnosis not present

## 2023-02-02 DIAGNOSIS — M19011 Primary osteoarthritis, right shoulder: Secondary | ICD-10-CM | POA: Diagnosis not present

## 2023-02-02 DIAGNOSIS — F411 Generalized anxiety disorder: Secondary | ICD-10-CM | POA: Diagnosis not present

## 2023-02-02 DIAGNOSIS — F32A Depression, unspecified: Secondary | ICD-10-CM | POA: Diagnosis not present

## 2023-02-03 DIAGNOSIS — R41841 Cognitive communication deficit: Secondary | ICD-10-CM | POA: Diagnosis not present

## 2023-02-03 DIAGNOSIS — M6281 Muscle weakness (generalized): Secondary | ICD-10-CM | POA: Diagnosis not present

## 2023-02-03 DIAGNOSIS — G8929 Other chronic pain: Secondary | ICD-10-CM | POA: Diagnosis not present

## 2023-02-03 DIAGNOSIS — R262 Difficulty in walking, not elsewhere classified: Secondary | ICD-10-CM | POA: Diagnosis not present

## 2023-02-03 DIAGNOSIS — M25519 Pain in unspecified shoulder: Secondary | ICD-10-CM | POA: Diagnosis not present

## 2023-02-04 DIAGNOSIS — R41841 Cognitive communication deficit: Secondary | ICD-10-CM | POA: Diagnosis not present

## 2023-02-04 DIAGNOSIS — R262 Difficulty in walking, not elsewhere classified: Secondary | ICD-10-CM | POA: Diagnosis not present

## 2023-02-04 DIAGNOSIS — M6281 Muscle weakness (generalized): Secondary | ICD-10-CM | POA: Diagnosis not present

## 2023-02-04 DIAGNOSIS — Z79899 Other long term (current) drug therapy: Secondary | ICD-10-CM | POA: Diagnosis not present

## 2023-02-05 DIAGNOSIS — R262 Difficulty in walking, not elsewhere classified: Secondary | ICD-10-CM | POA: Diagnosis not present

## 2023-02-05 DIAGNOSIS — R41841 Cognitive communication deficit: Secondary | ICD-10-CM | POA: Diagnosis not present

## 2023-02-05 DIAGNOSIS — M6281 Muscle weakness (generalized): Secondary | ICD-10-CM | POA: Diagnosis not present

## 2023-02-06 DIAGNOSIS — R41841 Cognitive communication deficit: Secondary | ICD-10-CM | POA: Diagnosis not present

## 2023-02-06 DIAGNOSIS — R262 Difficulty in walking, not elsewhere classified: Secondary | ICD-10-CM | POA: Diagnosis not present

## 2023-02-06 DIAGNOSIS — M6281 Muscle weakness (generalized): Secondary | ICD-10-CM | POA: Diagnosis not present

## 2023-02-07 DIAGNOSIS — R41841 Cognitive communication deficit: Secondary | ICD-10-CM | POA: Diagnosis not present

## 2023-02-07 DIAGNOSIS — R262 Difficulty in walking, not elsewhere classified: Secondary | ICD-10-CM | POA: Diagnosis not present

## 2023-02-07 DIAGNOSIS — M6281 Muscle weakness (generalized): Secondary | ICD-10-CM | POA: Diagnosis not present

## 2023-02-10 DIAGNOSIS — R41841 Cognitive communication deficit: Secondary | ICD-10-CM | POA: Diagnosis not present

## 2023-02-10 DIAGNOSIS — F5101 Primary insomnia: Secondary | ICD-10-CM | POA: Diagnosis not present

## 2023-02-10 DIAGNOSIS — M6281 Muscle weakness (generalized): Secondary | ICD-10-CM | POA: Diagnosis not present

## 2023-02-10 DIAGNOSIS — F32A Depression, unspecified: Secondary | ICD-10-CM | POA: Diagnosis not present

## 2023-02-10 DIAGNOSIS — R262 Difficulty in walking, not elsewhere classified: Secondary | ICD-10-CM | POA: Diagnosis not present

## 2023-02-10 DIAGNOSIS — F411 Generalized anxiety disorder: Secondary | ICD-10-CM | POA: Diagnosis not present

## 2023-02-11 DIAGNOSIS — R262 Difficulty in walking, not elsewhere classified: Secondary | ICD-10-CM | POA: Diagnosis not present

## 2023-02-11 DIAGNOSIS — R41841 Cognitive communication deficit: Secondary | ICD-10-CM | POA: Diagnosis not present

## 2023-02-11 DIAGNOSIS — M6281 Muscle weakness (generalized): Secondary | ICD-10-CM | POA: Diagnosis not present

## 2023-02-12 DIAGNOSIS — R262 Difficulty in walking, not elsewhere classified: Secondary | ICD-10-CM | POA: Diagnosis not present

## 2023-02-12 DIAGNOSIS — M6281 Muscle weakness (generalized): Secondary | ICD-10-CM | POA: Diagnosis not present

## 2023-02-12 DIAGNOSIS — R41841 Cognitive communication deficit: Secondary | ICD-10-CM | POA: Diagnosis not present

## 2023-02-13 DIAGNOSIS — R41841 Cognitive communication deficit: Secondary | ICD-10-CM | POA: Diagnosis not present

## 2023-02-13 DIAGNOSIS — M6281 Muscle weakness (generalized): Secondary | ICD-10-CM | POA: Diagnosis not present

## 2023-02-13 DIAGNOSIS — R262 Difficulty in walking, not elsewhere classified: Secondary | ICD-10-CM | POA: Diagnosis not present

## 2023-02-14 DIAGNOSIS — R41841 Cognitive communication deficit: Secondary | ICD-10-CM | POA: Diagnosis not present

## 2023-02-14 DIAGNOSIS — M6281 Muscle weakness (generalized): Secondary | ICD-10-CM | POA: Diagnosis not present

## 2023-02-14 DIAGNOSIS — R262 Difficulty in walking, not elsewhere classified: Secondary | ICD-10-CM | POA: Diagnosis not present

## 2023-02-17 DIAGNOSIS — M6281 Muscle weakness (generalized): Secondary | ICD-10-CM | POA: Diagnosis not present

## 2023-02-17 DIAGNOSIS — F419 Anxiety disorder, unspecified: Secondary | ICD-10-CM | POA: Diagnosis not present

## 2023-02-17 DIAGNOSIS — R262 Difficulty in walking, not elsewhere classified: Secondary | ICD-10-CM | POA: Diagnosis not present

## 2023-02-17 DIAGNOSIS — R41841 Cognitive communication deficit: Secondary | ICD-10-CM | POA: Diagnosis not present

## 2023-02-17 DIAGNOSIS — G8929 Other chronic pain: Secondary | ICD-10-CM | POA: Diagnosis not present

## 2023-02-18 DIAGNOSIS — R262 Difficulty in walking, not elsewhere classified: Secondary | ICD-10-CM | POA: Diagnosis not present

## 2023-02-18 DIAGNOSIS — R41841 Cognitive communication deficit: Secondary | ICD-10-CM | POA: Diagnosis not present

## 2023-02-18 DIAGNOSIS — M6281 Muscle weakness (generalized): Secondary | ICD-10-CM | POA: Diagnosis not present

## 2023-02-19 DIAGNOSIS — R41841 Cognitive communication deficit: Secondary | ICD-10-CM | POA: Diagnosis not present

## 2023-02-19 DIAGNOSIS — M6281 Muscle weakness (generalized): Secondary | ICD-10-CM | POA: Diagnosis not present

## 2023-02-19 DIAGNOSIS — R262 Difficulty in walking, not elsewhere classified: Secondary | ICD-10-CM | POA: Diagnosis not present

## 2023-02-20 DIAGNOSIS — F4323 Adjustment disorder with mixed anxiety and depressed mood: Secondary | ICD-10-CM | POA: Diagnosis not present

## 2023-02-20 DIAGNOSIS — M6281 Muscle weakness (generalized): Secondary | ICD-10-CM | POA: Diagnosis not present

## 2023-02-20 DIAGNOSIS — R41841 Cognitive communication deficit: Secondary | ICD-10-CM | POA: Diagnosis not present

## 2023-02-20 DIAGNOSIS — R262 Difficulty in walking, not elsewhere classified: Secondary | ICD-10-CM | POA: Diagnosis not present

## 2023-02-20 DIAGNOSIS — F411 Generalized anxiety disorder: Secondary | ICD-10-CM | POA: Diagnosis not present

## 2023-02-21 DIAGNOSIS — R262 Difficulty in walking, not elsewhere classified: Secondary | ICD-10-CM | POA: Diagnosis not present

## 2023-02-21 DIAGNOSIS — M6281 Muscle weakness (generalized): Secondary | ICD-10-CM | POA: Diagnosis not present

## 2023-02-21 DIAGNOSIS — R41841 Cognitive communication deficit: Secondary | ICD-10-CM | POA: Diagnosis not present

## 2023-02-24 DIAGNOSIS — R41841 Cognitive communication deficit: Secondary | ICD-10-CM | POA: Diagnosis not present

## 2023-02-24 DIAGNOSIS — R262 Difficulty in walking, not elsewhere classified: Secondary | ICD-10-CM | POA: Diagnosis not present

## 2023-02-24 DIAGNOSIS — M6281 Muscle weakness (generalized): Secondary | ICD-10-CM | POA: Diagnosis not present

## 2023-02-25 DIAGNOSIS — R41841 Cognitive communication deficit: Secondary | ICD-10-CM | POA: Diagnosis not present

## 2023-02-25 DIAGNOSIS — M6281 Muscle weakness (generalized): Secondary | ICD-10-CM | POA: Diagnosis not present

## 2023-02-25 DIAGNOSIS — R262 Difficulty in walking, not elsewhere classified: Secondary | ICD-10-CM | POA: Diagnosis not present

## 2023-02-26 DIAGNOSIS — R41841 Cognitive communication deficit: Secondary | ICD-10-CM | POA: Diagnosis not present

## 2023-02-26 DIAGNOSIS — M6281 Muscle weakness (generalized): Secondary | ICD-10-CM | POA: Diagnosis not present

## 2023-02-26 DIAGNOSIS — R262 Difficulty in walking, not elsewhere classified: Secondary | ICD-10-CM | POA: Diagnosis not present

## 2023-02-27 DIAGNOSIS — R41841 Cognitive communication deficit: Secondary | ICD-10-CM | POA: Diagnosis not present

## 2023-02-27 DIAGNOSIS — M6281 Muscle weakness (generalized): Secondary | ICD-10-CM | POA: Diagnosis not present

## 2023-02-27 DIAGNOSIS — R262 Difficulty in walking, not elsewhere classified: Secondary | ICD-10-CM | POA: Diagnosis not present

## 2023-02-27 DIAGNOSIS — N390 Urinary tract infection, site not specified: Secondary | ICD-10-CM | POA: Diagnosis not present

## 2023-02-28 DIAGNOSIS — R262 Difficulty in walking, not elsewhere classified: Secondary | ICD-10-CM | POA: Diagnosis not present

## 2023-02-28 DIAGNOSIS — M6281 Muscle weakness (generalized): Secondary | ICD-10-CM | POA: Diagnosis not present

## 2023-02-28 DIAGNOSIS — R41841 Cognitive communication deficit: Secondary | ICD-10-CM | POA: Diagnosis not present

## 2023-03-03 DIAGNOSIS — N390 Urinary tract infection, site not specified: Secondary | ICD-10-CM | POA: Diagnosis not present

## 2023-03-04 DIAGNOSIS — R262 Difficulty in walking, not elsewhere classified: Secondary | ICD-10-CM | POA: Diagnosis not present

## 2023-03-04 DIAGNOSIS — M6281 Muscle weakness (generalized): Secondary | ICD-10-CM | POA: Diagnosis not present

## 2023-03-04 DIAGNOSIS — R41841 Cognitive communication deficit: Secondary | ICD-10-CM | POA: Diagnosis not present

## 2023-03-05 DIAGNOSIS — R262 Difficulty in walking, not elsewhere classified: Secondary | ICD-10-CM | POA: Diagnosis not present

## 2023-03-05 DIAGNOSIS — R41841 Cognitive communication deficit: Secondary | ICD-10-CM | POA: Diagnosis not present

## 2023-03-05 DIAGNOSIS — F4323 Adjustment disorder with mixed anxiety and depressed mood: Secondary | ICD-10-CM | POA: Diagnosis not present

## 2023-03-05 DIAGNOSIS — M6281 Muscle weakness (generalized): Secondary | ICD-10-CM | POA: Diagnosis not present

## 2023-03-10 DIAGNOSIS — I5023 Acute on chronic systolic (congestive) heart failure: Secondary | ICD-10-CM | POA: Diagnosis not present

## 2023-03-10 DIAGNOSIS — R5383 Other fatigue: Secondary | ICD-10-CM | POA: Diagnosis not present

## 2023-03-13 DIAGNOSIS — N1831 Chronic kidney disease, stage 3a: Secondary | ICD-10-CM | POA: Diagnosis not present

## 2023-03-13 DIAGNOSIS — K219 Gastro-esophageal reflux disease without esophagitis: Secondary | ICD-10-CM | POA: Diagnosis not present

## 2023-03-13 DIAGNOSIS — F32A Depression, unspecified: Secondary | ICD-10-CM | POA: Diagnosis not present

## 2023-03-19 ENCOUNTER — Other Ambulatory Visit: Payer: Self-pay | Admitting: Orthopedic Surgery

## 2023-03-19 DIAGNOSIS — M546 Pain in thoracic spine: Secondary | ICD-10-CM

## 2023-03-19 DIAGNOSIS — M545 Low back pain, unspecified: Secondary | ICD-10-CM

## 2023-03-19 DIAGNOSIS — M4325 Fusion of spine, thoracolumbar region: Secondary | ICD-10-CM

## 2023-03-19 NOTE — Progress Notes (Deleted)
Referring Physician:  No referring provider defined for this encounter.  Primary Physician:  System, Provider Not In  History of Present Illness: 03/19/2023 Kimberly Cook has a history of heart failure, CKD 2, CAD, HTN, GERD, hyperlipidemia, hypothyroidism, nonischemic cardiomyopathy, stroke.   She is in O2 at night and prn. Has not needed it in a long time.   She has a history of T4-pelvis fusion with new T7 compression fracture s/p fall on 10/19/22. Was last seen by me on 01/28/23 and she was doing well. She is at a SNF.  She was to start PT at SNF and was lost to follow up.          No brace needed. She was to avoid any bending, twisting, or lifting.   She is here for follow up and repeat xrays.   She is a poor historian, but she notes overall increased pain in her mid and lower back that is intermittent. History of chronic numbness/tingling in left leg and she notes some intermittent lateral leg pain to her foot as well. No recent fall or injury noted.   She's not had PT in weeks and was told by SNF that she would restart PT this week.   Past Surgery:  01/12/21 and 01/15/21 - XLIFs and T4-pelvis by Dr. Myer Haff SCS placement and removal in 2021  Review of Systems:  A 10 point review of systems is negative, except for the pertinent positives and negatives detailed in the HPI.  Past Medical History: Past Medical History:  Diagnosis Date   Acute respiratory failure, unsp w hypoxia or hypercapnia (HCC)    Allergy    Anxiety    a.) on BZO (alprazolam) PRN   Aortic atherosclerosis (HCC)    Arthritis    Back pain    Chronic HFrEF (heart failure with reduced ejection fraction) (HCC)    a. 08/2021 echo: EF 30-35%, glob HK, GrI DD; b. 10/2021 Echo: EF 30-35%.   CKD (chronic kidney disease), stage II    Complication of anesthesia    Slow to clear meds after "14 hour back surgery"   Coronary artery disease    a. Mild to moderate CAD in LAD/diagonal by CTA (CT-FFR of  apical LAD 0.79); b. 08/2021 Cath: LM nl, LAD min irregs, D1/2/3 nl, LCX nl, OM2/3 nl, RCA 20p, RPDA mild dzs, RPAV nl.   DDD (degenerative disc disease), lumbar    Depression    Diverticulosis    Essential hypertension    GERD (gastroesophageal reflux disease)    Headache    Hiatal hernia    History of shingles    HLD (hyperlipidemia)    Hypothyroidism    Lung nodule    Mini stroke 2011   Moderate Pericardial effusion    a. 08/2021 Echo: moderate circumferential pericardial effusion w/o tamponade; b. 10/2021 Echo: EF 30-35%, small to mod circumferential pericardial effusion w/o tamponade.   Muscle weakness    NICM (nonischemic cardiomyopathy) (HCC)    a. 08/2021 Echo: EF 30-35%, glob HK, GrI DD, nl RV fxn, mild-mod dil LA, mod circumferential pericardial eff w/o tamponade, Mod MR; b. 10/2021 Echo: EF 30-35%, glob HK.   Occipital neuralgia    On supplemental oxygen by nasal cannula    a.) 2L.[place] at bedtime and PRN   Palpitations    Pancytopenia (HCC)    Pleural effusion on left    a. 08/2021 s/p thoracentesis.   Pneumonia 2018   Polyneuropathy    Prediabetes  Renal cyst, left    a.) Renal artery [place] 12/26/2017: measured 2.1 cm.   Sleep difficulties    a.) takes trazodone + melatonin   Stroke (HCC)    MRI 04/2008 + left sup. frontal gyrus possibly puntate infarct    Syncope 2019   T7 vertebral fracture (HCC) 10/22/2022   Uses wheelchair    able to stand and self transfer   Vitamin B deficiency    Vitamin D deficiency     Past Surgical History: Past Surgical History:  Procedure Laterality Date   ABDOMINAL HYSTERECTOMY     BLADDER SURGERY     2003   BREAST EXCISIONAL BIOPSY Right Over 20 years    Benign   CATARACT EXTRACTION W/PHACO Right 10/09/2022   Procedure: CATARACT EXTRACTION PHACO AND INTRAOCULAR LENS PLACEMENT (IOC) RIGHT  8.13  00:43.5;  Surgeon: Lockie Mola, MD;  Location: [facility];  Service: Ophthalmology;  Laterality: Right;   CATARACT  EXTRACTION W/PHACO Left 11/13/2022   Procedure: CATARACT EXTRACTION PHACO AND INTRAOCULAR LENS PLACEMENT (IOC) LEFT;  Surgeon: Lockie Mola, MD;  Location: [facility];  Service: Ophthalmology;  Laterality: Left;  11.47 0:51.7   CHOLECYSTECTOMY     CYSTOSCOPY W/ URETERAL STENT PLACEMENT Left 05/13/2022   Procedure: CYSTOSCOPY WITH RETROGRADE PYELOGRAM/URETERAL LEFT STENT PLACEMENT;  Surgeon: Vanna Scotland, MD;  Location: [facility];  Service: Urology;  Laterality: Left;   CYSTOSCOPY WITH BIOPSY N/A 05/13/2022   Procedure: CYSTOSCOPY WITH BLADDER BIOPSY;  Surgeon: Vanna Scotland, MD;  Location: [facility];  Service: Urology;  Laterality: N/A;   gastroplication      KNEE ARTHROSCOPY Left 2011   PULSE GENERATOR IMPLANT Left 01/31/2020   Procedure: PLACEMENT RIGHT FLANK PULSE GENERATOR VS REMOVAL SPINAL CORD STIMULATOR;  Surgeon: Lucy Chris, MD;  Location: [facility];  Service: Neurosurgery;  Laterality: Left;   PULSE GENERATOR IMPLANT Left 04/24/2020   Procedure: REPLACEMENT LEFT FLANK PULSE GENERATOR IMPLANT;  Surgeon: Lucy Chris, MD;  Location: [facility];  Service: Neurosurgery;  Laterality: Left;  MAC w/ local   REPLACEMENT TOTAL KNEE Left    right arm fracture     RIGHT/LEFT HEART CATH AND CORONARY ANGIOGRAPHY N/A 08/28/2021   Procedure: RIGHT/LEFT HEART CATH AND CORONARY ANGIOGRAPHY;  Surgeon: Iran Ouch, MD;  Location: [facility];  Service: Cardiovascular;  Laterality: N/A;   SPINAL CORD STIMULATOR REMOVAL N/A 06/26/2020   Procedure: SPINAL CORD STIMULATOR REMOVAL;  Surgeon: Lucy Chris, MD;  Location: [facility];  Service: Neurosurgery;  Laterality: N/A;   THORACIC LAMINECTOMY FOR SPINAL CORD STIMULATOR N/A 01/24/2020   Procedure: THORACIC SPINAL CORD STIMULATOR PADDLE TRIAL VIA LAMINECTOMY;  Surgeon: Lucy Chris, MD;  Location: [facility];  Service: Neurosurgery;  Laterality: N/A;   TONSILLECTOMY AND ADENOIDECTOMY     TOTAL KNEE ARTHROPLASTY Left 04/17/2015    Procedure: LEFT TOTAL KNEE ARTHROPLASTY;  Surgeon: Durene Romans, MD;  Location: [facility];  Service: Orthopedics;  Laterality: Left;    Allergies: Allergies as of 03/20/2023 - Review Complete 01/28/2023  Allergen Reaction Noted   Vibegron Other (See Comments) 07/30/2022    Medications: Outpatient Encounter Medications as of 03/20/2023  Medication Sig   acetaminophen (TYLENOL) 500 MG tablet Take 500 mg by mouth every 8 (eight) hours as needed for moderate pain.   alendronate (FOSAMAX) 70 MG tablet Take 70 mg by mouth once a week.    ALPRAZolam (XANAX) 0.5 MG tablet Take 0.5 mg by mouth every 6 (six) hours as needed for anxiety.   aspirin EC  81 MG tablet Take 1 tablet (81 mg total) by mouth at bedtime. Swallow whole.   atorvastatin (LIPITOR) 20 MG tablet Take 1 tablet (20 mg total) by mouth daily.   calcium-vitamin D (OSCAL WITH D) 500-5 MG-MCG tablet Take 1 tablet by mouth 2 (two) times daily.   Calcium-Vitamin D (OYSTER SHELL/D PO) Take 1 tablet by mouth 2 (two) times daily.   carboxymethylcellulose 1 % ophthalmic solution Place 1 drop into both eyes at bedtime.   cetirizine (ZYRTEC) 5 MG tablet Take 5 mg by mouth daily.   Cholecalciferol (VITAMIN D3) 125 MCG (5000 UT) TABS Take 1 tablet (5,000 Units total) by mouth daily.   citalopram (CELEXA) 20 MG tablet Take 20 mg by mouth daily.   Cranberry 450 MG TABS Take 1 tablet by mouth every morning.   diclofenac Sodium (VOLTAREN) 1 % GEL Apply 2 g topically 4 (four) times daily.   famotidine (PEPCID) 20 MG tablet Take 20 mg by mouth at bedtime.   HYDROcodone-acetaminophen (NORCO/VICODIN) 5-325 MG tablet Take 1 tablet by mouth every 6 (six) hours as needed.   hydrocortisone cream 1 % Apply 1 Application topically 2 (two) times daily as needed for itching.   ketorolac (ACULAR) 0.5 % ophthalmic solution Place 1 drop into the left eye 4 (four) times daily.   levothyroxine (SYNTHROID) 100 MCG tablet Take 1 tablet (100 mcg total) by mouth daily before  breakfast.   losartan (COZAAR) 25 MG tablet Take 0.5 tablets (12.5 mg total) by mouth daily.   melatonin 5 MG TABS Take 1 tablet (5 mg total) by mouth at bedtime.   midodrine (PROAMATINE) 10 MG tablet Take 1 tablet (10 mg total) by mouth 3 (three) times daily with meals.   montelukast (SINGULAIR) 10 MG tablet Take 1 tablet (10 mg total) by mouth at bedtime.   moxifloxacin (VIGAMOX) 0.5 % ophthalmic solution Place 1 drop into the right eye 4 (four) times daily.   nystatin powder Apply 1 Application topically every 6 (six) hours as needed (groin rash).   omeprazole (PRILOSEC) 40 MG capsule Take 1 capsule (40 mg total) by mouth daily. After lunch   ondansetron (ZOFRAN) 4 MG tablet Take 1 tablet (4 mg total) by mouth every 8 (eight) hours as needed for nausea or vomiting.   oxybutynin (DITROPAN) 5 MG tablet Take 1 tablet (5 mg total) by mouth every 8 (eight) hours as needed for bladder spasms.   OXYGEN Inhale into the lungs. Uses for exertion   polyethylene glycol (MIRALAX / GLYCOLAX) 17 g packet Take 17 g by mouth daily.   potassium chloride SA (KLOR-CON M) 20 MEQ tablet Take 20 mEq by mouth 2 (two) times daily.   prednisoLONE acetate (PRED FORTE) 1 % ophthalmic suspension Place 1 drop into the left eye 4 (four) times daily.   pregabalin (LYRICA) 150 MG capsule Take 150 mg by mouth at bedtime.   pregabalin (LYRICA) 75 MG capsule Take 75 mg by mouth every morning.   senna-docusate (SENOKOT-S) 8.6-50 MG tablet Take 2 tablets by mouth at bedtime.   simethicone (MYLICON) 80 MG chewable tablet Chew 80 mg by mouth every 6 (six) hours as needed for flatulence.   traZODone (DESYREL) 100 MG tablet Take 100 mg by mouth at bedtime.   venlafaxine (EFFEXOR) 75 MG tablet Take 75 mg by mouth 2 (two) times daily.   No facility-administered encounter medications on file as of 03/20/2023.    Social History: Social History   Tobacco Use   Smoking status:  Never    Passive exposure: Yes   Smokeless tobacco:  Never   Tobacco comments:    husbands and children smoked in home.   Vaping Use   Vaping status: Never Used  Substance Use Topics   Alcohol use: No   Drug use: No    Family Medical History: Family History  Problem Relation Age of Onset   Heart disease Mother    Hypertension Mother    Diabetes Mother    Heart attack Mother 81   Heart disease Father    Heart attack Father 24   Breast cancer Maternal Aunt    Heart attack Brother     Physical Examination: There were no vitals filed for this visit.      Awake, alert, oriented to person, place, and time.  Speech is clear and fluent. Fund of knowledge is appropriate.   Cranial Nerves: Pupils equal round and reactive to light.  Facial tone is symmetric.    She has no mid thoracic tenderness. She has tenderness at TL junction and mild lower lumbar tenderness.   No abnormal lesions on exposed skin.   Strength:  Side Iliopsoas Quads Hamstring PF DF EHL  R 5 5 5 5 5 5   L 5 5 5 5 5 5    Bilateral  lower extremity sensation is intact to light touch.     Gait is not tested. She is in a WC.   Medical Decision Making  Imaging: Thoracic xrays dated ***:  Hardware intact with no complications. Difficult to visualize T7 compression fracture but it appears stable. ***  Lumbar xrays dated ***:  ***  Above radiology reports not available.    Assessment and Plan: Kimberly Cook is a pleasant 77 y.o. female has history of T4-pelvis fusion with new T7 compression fracture s/p fall on 10/19/22.   She notes overall increased pain in her mid and lower back that is intermittent. History of chronic numbness/tingling in left leg and she notes some intermittent lateral leg pain to her foot as well. No recent fall or injury noted.   Xrays from today show T7 fracture is stable.   Treatment options discussed with patient and following plan made:   - Agree with starting PT at SNF for progressive mobilization.  - Hopefully, she will see  improvement in her pain with starting PT and being more mobile.  - Follow up with me in 6 weeks for recheck. If no improvement, may need to get updated imaging.   I spent a total of 20 minutes in face-to-face and non-face-to-face activities related to this patient's care today including review of outside records, review of imaging, review of symptoms, physical exam, discussion of differential diagnosis, discussion of treatment options, and documentation.   Drake Leach PA-C Dept. of Neurosurgery

## 2023-03-20 ENCOUNTER — Ambulatory Visit: Payer: Medicare HMO | Admitting: Orthopedic Surgery

## 2023-03-21 DIAGNOSIS — Z8673 Personal history of transient ischemic attack (TIA), and cerebral infarction without residual deficits: Secondary | ICD-10-CM | POA: Diagnosis not present

## 2023-03-21 DIAGNOSIS — M6281 Muscle weakness (generalized): Secondary | ICD-10-CM | POA: Diagnosis not present

## 2023-03-24 DIAGNOSIS — Z1159 Encounter for screening for other viral diseases: Secondary | ICD-10-CM | POA: Diagnosis not present

## 2023-03-24 DIAGNOSIS — I10 Essential (primary) hypertension: Secondary | ICD-10-CM | POA: Diagnosis not present

## 2023-03-25 DIAGNOSIS — U071 COVID-19: Secondary | ICD-10-CM | POA: Diagnosis not present

## 2023-03-25 DIAGNOSIS — N1831 Chronic kidney disease, stage 3a: Secondary | ICD-10-CM | POA: Diagnosis not present

## 2023-03-25 DIAGNOSIS — E860 Dehydration: Secondary | ICD-10-CM | POA: Diagnosis not present

## 2023-03-26 ENCOUNTER — Emergency Department: Payer: Medicare HMO

## 2023-03-26 ENCOUNTER — Other Ambulatory Visit: Payer: Self-pay

## 2023-03-26 ENCOUNTER — Encounter: Payer: Self-pay | Admitting: Internal Medicine

## 2023-03-26 ENCOUNTER — Inpatient Hospital Stay
Admission: EM | Admit: 2023-03-26 | Discharge: 2023-04-04 | DRG: 177 | Disposition: A | Discharge status: Skilled Nursing Facility | Payer: Medicare HMO | Source: Skilled Nursing Facility | Attending: Internal Medicine | Admitting: Internal Medicine

## 2023-03-26 DIAGNOSIS — G4700 Insomnia, unspecified: Secondary | ICD-10-CM | POA: Diagnosis present

## 2023-03-26 DIAGNOSIS — I959 Hypotension, unspecified: Secondary | ICD-10-CM | POA: Diagnosis not present

## 2023-03-26 DIAGNOSIS — F419 Anxiety disorder, unspecified: Secondary | ICD-10-CM | POA: Diagnosis present

## 2023-03-26 DIAGNOSIS — I2510 Atherosclerotic heart disease of native coronary artery without angina pectoris: Secondary | ICD-10-CM | POA: Diagnosis present

## 2023-03-26 DIAGNOSIS — R944 Abnormal results of kidney function studies: Secondary | ICD-10-CM | POA: Diagnosis present

## 2023-03-26 DIAGNOSIS — E669 Obesity, unspecified: Secondary | ICD-10-CM | POA: Diagnosis present

## 2023-03-26 DIAGNOSIS — Z8619 Personal history of other infectious and parasitic diseases: Secondary | ICD-10-CM

## 2023-03-26 DIAGNOSIS — E860 Dehydration: Secondary | ICD-10-CM | POA: Diagnosis not present

## 2023-03-26 DIAGNOSIS — F0393 Unspecified dementia, unspecified severity, with mood disturbance: Secondary | ICD-10-CM | POA: Diagnosis present

## 2023-03-26 DIAGNOSIS — Z743 Need for continuous supervision: Secondary | ICD-10-CM | POA: Diagnosis not present

## 2023-03-26 DIAGNOSIS — R4182 Altered mental status, unspecified: Secondary | ICD-10-CM | POA: Diagnosis not present

## 2023-03-26 DIAGNOSIS — I5020 Unspecified systolic (congestive) heart failure: Secondary | ICD-10-CM | POA: Diagnosis not present

## 2023-03-26 DIAGNOSIS — E039 Hypothyroidism, unspecified: Secondary | ICD-10-CM | POA: Diagnosis present

## 2023-03-26 DIAGNOSIS — R109 Unspecified abdominal pain: Secondary | ICD-10-CM | POA: Diagnosis not present

## 2023-03-26 DIAGNOSIS — Z90710 Acquired absence of both cervix and uterus: Secondary | ICD-10-CM

## 2023-03-26 DIAGNOSIS — K219 Gastro-esophageal reflux disease without esophagitis: Secondary | ICD-10-CM | POA: Diagnosis present

## 2023-03-26 DIAGNOSIS — Z66 Do not resuscitate: Secondary | ICD-10-CM | POA: Diagnosis present

## 2023-03-26 DIAGNOSIS — Z8701 Personal history of pneumonia (recurrent): Secondary | ICD-10-CM

## 2023-03-26 DIAGNOSIS — E785 Hyperlipidemia, unspecified: Secondary | ICD-10-CM | POA: Diagnosis present

## 2023-03-26 DIAGNOSIS — F32A Depression, unspecified: Secondary | ICD-10-CM | POA: Diagnosis present

## 2023-03-26 DIAGNOSIS — E871 Hypo-osmolality and hyponatremia: Secondary | ICD-10-CM | POA: Diagnosis not present

## 2023-03-26 DIAGNOSIS — F0390 Unspecified dementia without behavioral disturbance: Secondary | ICD-10-CM

## 2023-03-26 DIAGNOSIS — N281 Cyst of kidney, acquired: Secondary | ICD-10-CM | POA: Diagnosis not present

## 2023-03-26 DIAGNOSIS — U071 COVID-19: Secondary | ICD-10-CM | POA: Diagnosis not present

## 2023-03-26 DIAGNOSIS — N182 Chronic kidney disease, stage 2 (mild): Secondary | ICD-10-CM | POA: Diagnosis present

## 2023-03-26 DIAGNOSIS — I10 Essential (primary) hypertension: Secondary | ICD-10-CM | POA: Diagnosis not present

## 2023-03-26 DIAGNOSIS — Z96652 Presence of left artificial knee joint: Secondary | ICD-10-CM | POA: Diagnosis present

## 2023-03-26 DIAGNOSIS — Z8249 Family history of ischemic heart disease and other diseases of the circulatory system: Secondary | ICD-10-CM

## 2023-03-26 DIAGNOSIS — R7303 Prediabetes: Secondary | ICD-10-CM | POA: Diagnosis present

## 2023-03-26 DIAGNOSIS — Z9981 Dependence on supplemental oxygen: Secondary | ICD-10-CM

## 2023-03-26 DIAGNOSIS — R410 Disorientation, unspecified: Secondary | ICD-10-CM | POA: Diagnosis not present

## 2023-03-26 DIAGNOSIS — K8689 Other specified diseases of pancreas: Secondary | ICD-10-CM | POA: Diagnosis not present

## 2023-03-26 DIAGNOSIS — Z7983 Long term (current) use of bisphosphonates: Secondary | ICD-10-CM

## 2023-03-26 DIAGNOSIS — F0394 Unspecified dementia, unspecified severity, with anxiety: Secondary | ICD-10-CM | POA: Diagnosis present

## 2023-03-26 DIAGNOSIS — Z6829 Body mass index (BMI) 29.0-29.9, adult: Secondary | ICD-10-CM

## 2023-03-26 DIAGNOSIS — Z833 Family history of diabetes mellitus: Secondary | ICD-10-CM

## 2023-03-26 DIAGNOSIS — I700 Atherosclerosis of aorta: Secondary | ICD-10-CM | POA: Diagnosis present

## 2023-03-26 DIAGNOSIS — M5481 Occipital neuralgia: Secondary | ICD-10-CM | POA: Diagnosis present

## 2023-03-26 DIAGNOSIS — Z515 Encounter for palliative care: Secondary | ICD-10-CM | POA: Diagnosis not present

## 2023-03-26 DIAGNOSIS — G9341 Metabolic encephalopathy: Secondary | ICD-10-CM | POA: Diagnosis present

## 2023-03-26 DIAGNOSIS — Z888 Allergy status to other drugs, medicaments and biological substances status: Secondary | ICD-10-CM

## 2023-03-26 DIAGNOSIS — E876 Hypokalemia: Secondary | ICD-10-CM | POA: Diagnosis not present

## 2023-03-26 DIAGNOSIS — I428 Other cardiomyopathies: Secondary | ICD-10-CM | POA: Diagnosis present

## 2023-03-26 DIAGNOSIS — I130 Hypertensive heart and chronic kidney disease with heart failure and stage 1 through stage 4 chronic kidney disease, or unspecified chronic kidney disease: Secondary | ICD-10-CM | POA: Diagnosis present

## 2023-03-26 DIAGNOSIS — I5022 Chronic systolic (congestive) heart failure: Secondary | ICD-10-CM | POA: Diagnosis present

## 2023-03-26 DIAGNOSIS — M549 Dorsalgia, unspecified: Secondary | ICD-10-CM | POA: Diagnosis present

## 2023-03-26 DIAGNOSIS — E872 Acidosis, unspecified: Secondary | ICD-10-CM | POA: Diagnosis not present

## 2023-03-26 DIAGNOSIS — Z8673 Personal history of transient ischemic attack (TIA), and cerebral infarction without residual deficits: Secondary | ICD-10-CM

## 2023-03-26 DIAGNOSIS — I3139 Other pericardial effusion (noninflammatory): Secondary | ICD-10-CM | POA: Diagnosis present

## 2023-03-26 DIAGNOSIS — R531 Weakness: Secondary | ICD-10-CM

## 2023-03-26 DIAGNOSIS — Z79899 Other long term (current) drug therapy: Secondary | ICD-10-CM

## 2023-03-26 DIAGNOSIS — Z7982 Long term (current) use of aspirin: Secondary | ICD-10-CM

## 2023-03-26 DIAGNOSIS — R627 Adult failure to thrive: Secondary | ICD-10-CM | POA: Diagnosis present

## 2023-03-26 DIAGNOSIS — M1990 Unspecified osteoarthritis, unspecified site: Secondary | ICD-10-CM | POA: Diagnosis present

## 2023-03-26 DIAGNOSIS — Z96659 Presence of unspecified artificial knee joint: Secondary | ICD-10-CM

## 2023-03-26 DIAGNOSIS — R0689 Other abnormalities of breathing: Secondary | ICD-10-CM | POA: Diagnosis not present

## 2023-03-26 DIAGNOSIS — Z79890 Hormone replacement therapy (postmenopausal): Secondary | ICD-10-CM

## 2023-03-26 DIAGNOSIS — I672 Cerebral atherosclerosis: Secondary | ICD-10-CM | POA: Diagnosis not present

## 2023-03-26 LAB — COMPREHENSIVE METABOLIC PANEL
ALT: 76 U/L — ABNORMAL HIGH (ref 0–44)
AST: 90 U/L — ABNORMAL HIGH (ref 15–41)
Albumin: 3.7 g/dL (ref 3.5–5.0)
Alkaline Phosphatase: 82 U/L (ref 38–126)
Anion gap: 17 — ABNORMAL HIGH (ref 5–15)
BUN: 24 mg/dL — ABNORMAL HIGH (ref 8–23)
CO2: 14 mmol/L — ABNORMAL LOW (ref 22–32)
Calcium: 9.7 mg/dL (ref 8.9–10.3)
Chloride: 99 mmol/L (ref 98–111)
Creatinine, Ser: 0.7 mg/dL (ref 0.44–1.00)
GFR, Estimated: >60 mL/min (ref >60)
Glucose, Bld: 127 mg/dL — ABNORMAL HIGH (ref 70–99)
Potassium: 3.3 mmol/L — ABNORMAL LOW (ref 3.5–5.1)
Sodium: 130 mmol/L — ABNORMAL LOW (ref 135–145)
Total Bilirubin: 1.3 mg/dL — ABNORMAL HIGH (ref 0.3–1.2)
Total Protein: 7.7 g/dL (ref 6.5–8.1)

## 2023-03-26 LAB — CBC WITH DIFFERENTIAL/PLATELET
Abs Immature Granulocytes: 0.04 10*3/uL (ref 0.00–0.07)
Basophils Absolute: 0 10*3/uL (ref 0.0–0.1)
Basophils Relative: 0 %
Eosinophils Absolute: 0 10*3/uL (ref 0.0–0.5)
Eosinophils Relative: 0 %
HCT: 43.3 % (ref 36.0–46.0)
Hemoglobin: 15.5 g/dL — ABNORMAL HIGH (ref 12.0–15.0)
Immature Granulocytes: 1 %
Lymphocytes Relative: 15 %
Lymphs Abs: 1.1 10*3/uL (ref 0.7–4.0)
MCH: 30.2 pg (ref 26.0–34.0)
MCHC: 35.8 g/dL (ref 30.0–36.0)
MCV: 84.2 fL (ref 80.0–100.0)
Monocytes Absolute: 0.4 10*3/uL (ref 0.1–1.0)
Monocytes Relative: 6 %
Neutro Abs: 5.5 10*3/uL (ref 1.7–7.7)
Neutrophils Relative %: 78 %
Platelets: 231 10*3/uL (ref 150–400)
RBC: 5.14 MIL/uL — ABNORMAL HIGH (ref 3.87–5.11)
RDW: 12.3 % (ref 11.5–15.5)
WBC: 7.1 10*3/uL (ref 4.0–10.5)
nRBC: 0 % (ref 0.0–0.2)

## 2023-03-26 LAB — TSH: TSH: 2.044 u[IU]/mL (ref 0.350–4.500)

## 2023-03-26 LAB — MAGNESIUM: Magnesium: 1.7 mg/dL (ref 1.7–2.4)

## 2023-03-26 LAB — URINALYSIS, ROUTINE W REFLEX MICROSCOPIC
Bilirubin Urine: NEGATIVE
Glucose, UA: NEGATIVE mg/dL
Ketones, ur: 20 mg/dL — AB
Nitrite: NEGATIVE
Protein, ur: 100 mg/dL — AB
Specific Gravity, Urine: >1.046 — ABNORMAL HIGH (ref 1.005–1.030)
pH: 5 (ref 5.0–8.0)

## 2023-03-26 LAB — PROCALCITONIN: Procalcitonin: 0.11 ng/mL

## 2023-03-26 LAB — TROPONIN I (HIGH SENSITIVITY): Troponin I (High Sensitivity): 13 ng/L (ref <18)

## 2023-03-26 LAB — SARS CORONAVIRUS 2 BY RT PCR: SARS Coronavirus 2 by RT PCR: POSITIVE — AB

## 2023-03-26 MED ORDER — ONDANSETRON HCL 4 MG/2ML IJ SOLN: 4.0000 mg | Freq: Four times a day (QID) | INTRAMUSCULAR | Status: AC | PRN

## 2023-03-26 MED ORDER — SENNOSIDES-DOCUSATE SODIUM 8.6-50 MG PO TABS: 1.0000 | ORAL_TABLET | Freq: Every evening | ORAL | Status: DC | PRN

## 2023-03-26 MED ORDER — ACETAMINOPHEN 325 MG PO TABS
650.0000 mg | ORAL_TABLET | Freq: Four times a day (QID) | ORAL | Status: AC | PRN
  Filled 2023-03-26: qty 2

## 2023-03-26 MED ORDER — ENOXAPARIN SODIUM 40 MG/0.4ML IJ SOSY
40.0000 mg | PREFILLED_SYRINGE | INTRAMUSCULAR | Status: DC
  Administered 2023-03-26 – 2023-04-03 (×8): 40 mg via SUBCUTANEOUS
  Filled 2023-03-26 (×9): qty 0.4

## 2023-03-26 MED ORDER — MELATONIN 5 MG PO TABS
5.0000 mg | ORAL_TABLET | Freq: Every day | ORAL | Status: DC
  Administered 2023-03-31 – 2023-04-01 (×2): 5 mg via ORAL
  Filled 2023-03-26 (×5): qty 1

## 2023-03-26 MED ORDER — LACTATED RINGERS IV BOLUS
1000.0000 mL | Freq: Once | INTRAVENOUS | Status: AC
  Administered 2023-03-26: 1000 mL via INTRAVENOUS

## 2023-03-26 MED ORDER — LOSARTAN POTASSIUM 25 MG PO TABS
12.5000 mg | ORAL_TABLET | Freq: Every day | ORAL | Status: DC
  Administered 2023-04-02: 12.5 mg via ORAL
  Filled 2023-03-26 (×5): qty 1

## 2023-03-26 MED ORDER — ACETAMINOPHEN 650 MG RE SUPP: 650.0000 mg | Freq: Four times a day (QID) | RECTAL | Status: AC | PRN

## 2023-03-26 MED ORDER — LEVOTHYROXINE SODIUM 100 MCG PO TABS
100.0000 ug | ORAL_TABLET | Freq: Every day | ORAL | Status: DC
  Administered 2023-03-30 – 2023-04-02 (×3): 100 ug via ORAL
  Filled 2023-03-26 (×4): qty 1

## 2023-03-26 MED ORDER — ONDANSETRON HCL 4 MG PO TABS: 4.0000 mg | ORAL_TABLET | Freq: Four times a day (QID) | ORAL | Status: AC | PRN

## 2023-03-26 MED ORDER — POTASSIUM CHLORIDE 10 MEQ/100ML IV SOLN
10.0000 meq | INTRAVENOUS | Status: AC
  Administered 2023-03-26 – 2023-03-27 (×2): 10 meq via INTRAVENOUS
  Filled 2023-03-26 (×2): qty 100

## 2023-03-26 MED ORDER — ALPRAZOLAM 0.5 MG PO TABS
0.5000 mg | ORAL_TABLET | Freq: Four times a day (QID) | ORAL | Status: DC | PRN
  Administered 2023-03-31: 0.5 mg via ORAL
  Filled 2023-03-26 (×2): qty 1

## 2023-03-26 MED ORDER — SODIUM CHLORIDE 0.9 % IV SOLN: INTRAVENOUS | Status: DC

## 2023-03-26 MED ORDER — TRAZODONE HCL 100 MG PO TABS
100.0000 mg | ORAL_TABLET | Freq: Every day | ORAL | Status: DC
  Filled 2023-03-26: qty 1

## 2023-03-26 MED ORDER — CITALOPRAM HYDROBROMIDE 20 MG PO TABS
20.0000 mg | ORAL_TABLET | Freq: Every day | ORAL | Status: DC
  Administered 2023-04-02: 20 mg via ORAL
  Filled 2023-03-26 (×3): qty 1

## 2023-03-26 MED ORDER — IOHEXOL 300 MG/ML  SOLN
100.0000 mL | Freq: Once | INTRAMUSCULAR | Status: AC | PRN
  Administered 2023-03-26: 100 mL via INTRAVENOUS

## 2023-03-26 MED ORDER — POTASSIUM CHLORIDE CRYS ER 20 MEQ PO TBCR
20.0000 meq | EXTENDED_RELEASE_TABLET | Freq: Once | ORAL | Status: DC
  Filled 2023-03-26: qty 1

## 2023-03-26 MED ORDER — OXYBUTYNIN CHLORIDE 5 MG PO TABS: 5.0000 mg | ORAL_TABLET | Freq: Three times a day (TID) | ORAL | Status: DC | PRN

## 2023-03-26 NOTE — ED Notes (Signed)
This RN spoke with Deniece Portela, pt's son who is co-POA, and updated him on pt's condition, treatment, and plan of care.

## 2023-03-26 NOTE — ED Notes (Signed)
Per inpatient RN hold pt due to room being re-cleaned.

## 2023-03-26 NOTE — Assessment & Plan Note (Signed)
-   Home levothyroxine 100 mcg daily before breakfast resumed

## 2023-03-26 NOTE — Assessment & Plan Note (Signed)
I suspect this is multifactorial in setting of recent COVID-19 infection along with poor appetite leading to clinical dehydration.  History of advanced dementia and worsening depression by family.  Patient was not eating well over the past couple of weeks at facility.  Concern of failure to thrive. CT head without any acute abnormality. Treat symptomatically Patient received IV fluid in ED. -Swallow evaluation pending as patient is keeping everything in her mouth -Continue IV fluid -Palliative care consult

## 2023-03-26 NOTE — ED Notes (Signed)
 Provided urine incontinence care. Replaced bed linen, bed pads, pt gown, and blankets. Pt repositioned, side rails up x2,  and call light placed back within reach. No further needs identified at this time.

## 2023-03-26 NOTE — ED Notes (Signed)
Non slip socks, fall bracelet applied. Bed alarm on. Call light and personal belongings in reach.

## 2023-03-26 NOTE — Assessment & Plan Note (Signed)
Home Celexa 20 mg daily, trazodone 100 mg nightly for resumed Alprazolam 0.5 mg p.o. every 6 hours as needed for anxiety resumed

## 2023-03-26 NOTE — H&P (Addendum)
History and Physical   SEQOUIA CATRAMBONE WRU:[id number] DOB: Feb 16, 1946 DOA: 03/26/2023  PCP: Almetta Lovely, [facility]  Outpatient Specialists: Dr. Okey Dupre, [facility] cardiology Patient coming from: [facility]  I have personally briefly reviewed patient's old medical records in [facility] EMR.  Chief Concern: Altered mental status, failure to thrive  HPI: [name] is a 77 year old female with history of dementia, recent COVID-19 infection, hyperlipidemia, hypertension, neuropathy, insomnia, depression, anxiety, who presents to the emergency department for chief concerns of altered mental status, failure to thrive.  Patient is from [facility].  Vitals in the ED showed temperature of 98.8, respiration rate of 17, heart rate of 100, blood pressure 160/111, SpO2 of 100% on room air.  Serum sodium is 130, potassium 3.3, chloride 99, bicarb 14, BUN of 24, serum creatinine 0.70, eGFR greater than 60, nonfasting blood glucose 127, WBC 7.1, hemoglobin 15.5, platelets of 231.  High sensitivity troponin was 13.  TSH was 2.044.  ED treatment: LR 2 L bolus x 2. ----------------------------- At bedside patient was able to tell me her name, first name.  She was not able to tell me her age, the current location, current calendar year.  Patient not able to participate in H&P process.  Patient is confused.  Social history: Patient is from [facility]  ROS: Unable to complete as patient is confused  ED Course: Discussed with emergency medicine provider, patient requiring hospitalization for chief concerns of altered mental status.  Assessment/Plan  Principal Problem:   Altered mental status Active Problems:   Hypothyroidism   Back pain   Generalized weakness   Hypokalemia   Hyperlipidemia   GERD (gastroesophageal reflux disease)   S/P TKR (total knee replacement)   Essential hypertension   Coronary artery disease involving native coronary artery of native heart without  angina pectoris   Anxiety and depression   Dehydration   Dementia without behavioral disturbance (HCC)   Assessment and Plan:  * Altered mental status I suspect this is multifactorial in setting of recent COVID-19 infection along with poor appetite leading to clinical dehydration Treat symptomatically Status post LR bolus, 2 L were given by EDP Continue with sodium chloride infusion at 100 mL per hour, 1 day ordered  Hypothyroidism Home levothyroxine 100 mcg daily before breakfast resumed  Hypokalemia Status was LR x 2 per EDP Potassium chloride 20 mill equivalent p.o. one-time dose ordered Magnesium checked on admission was 1.7, no replacement indicated at this time  Dementia without behavioral disturbance (HCC) Diagnosed approximately 1 month ago  Dehydration With hemoconcentration, elevated BUN and metabolic acidosis Mucosa dry on presentation Status post LR 2 L bolus per EDP Sodium chloride infusion at 100 mL/h, 1 day ordered Recheck BMP in a.m.  Anxiety and depression Home Celexa 20 mg daily, trazodone 100 mg nightly for resumed Alprazolam 0.5 mg p.o. every 6 hours as needed for anxiety resumed  Essential hypertension Not controlled on admission Home losartan 12.5 mg daily resumed  Med reconciliation, a.m. team to complete med reconciliation  Chart reviewed.   Complete echo on 03/01/2022: Estimated ejection fraction is 45 to 50%.  DVT prophylaxis: Enoxaparin Code Status: DNR, ACP reviewed Diet: N.p.o. except for sips with meds Family Communication: Attempted to call POA listed in the chart Mr. Shelly Rubenstein at [phone], no pickup.  There was no voicemail message indicating that the patient I called was correct therefore I did not leave a message Disposition Plan: Pending clinical course Consults called: None at this time  Admission status: Telemetry medical, observation  Past Medical History:  Diagnosis Date   Acute respiratory failure, unsp w hypoxia or  hypercapnia (HCC)    Allergy    Anxiety    a.) on BZO (alprazolam) PRN   Aortic atherosclerosis (HCC)    Arthritis    Back pain    Chronic HFrEF (heart failure with reduced ejection fraction) (HCC)    a. 08/2021 echo: EF 30-35%, glob HK, GrI DD; b. 10/2021 Echo: EF 30-35%.   CKD (chronic kidney disease), stage II    Complication of anesthesia    Slow to clear meds after "14 hour back surgery"   Coronary artery disease    a. Mild to moderate CAD in LAD/diagonal by CTA (CT-FFR of apical LAD 0.79); b. 08/2021 Cath: LM nl, LAD min irregs, D1/2/3 nl, LCX nl, OM2/3 nl, RCA 20p, RPDA mild dzs, RPAV nl.   DDD (degenerative disc disease), lumbar    Depression    Diverticulosis    Essential hypertension    GERD (gastroesophageal reflux disease)    Headache    Hiatal hernia    History of shingles    HLD (hyperlipidemia)    Hypothyroidism    Lung nodule    Mini stroke 2011   Moderate Pericardial effusion    a. 08/2021 Echo: moderate circumferential pericardial effusion w/o tamponade; b. 10/2021 Echo: EF 30-35%, small to mod circumferential pericardial effusion w/o tamponade.   Muscle weakness    NICM (nonischemic cardiomyopathy) (HCC)    a. 08/2021 Echo: EF 30-35%, glob HK, GrI DD, nl RV fxn, mild-mod dil LA, mod circumferential pericardial eff w/o tamponade, Mod MR; b. 10/2021 Echo: EF 30-35%, glob HK.   Occipital neuralgia    On supplemental oxygen by nasal cannula    a.) 2L.[place] at bedtime and PRN   Palpitations    Pancytopenia (HCC)    Pleural effusion on left    a. 08/2021 s/p thoracentesis.   Pneumonia 2018   Polyneuropathy    Prediabetes    Renal cyst, left    a.) Renal artery [place] 12/26/2017: measured 2.1 cm.   Sleep difficulties    a.) takes trazodone + melatonin   Stroke (HCC)    MRI 04/2008 + left sup. frontal gyrus possibly puntate infarct    Syncope 2019   T7 vertebral fracture (HCC) 10/22/2022   Uses wheelchair    able to stand and self transfer   Vitamin B deficiency     Vitamin D deficiency    Past Surgical History:  Procedure Laterality Date   ABDOMINAL HYSTERECTOMY     BLADDER SURGERY     2003   BREAST EXCISIONAL BIOPSY Right Over 20 years    Benign   CATARACT EXTRACTION W/PHACO Right 10/09/2022   Procedure: CATARACT EXTRACTION PHACO AND INTRAOCULAR LENS PLACEMENT (IOC) RIGHT  8.13  00:43.5;  Surgeon: Lockie Mola, MD;  Location: [facility];  Service: Ophthalmology;  Laterality: Right;   CATARACT EXTRACTION W/PHACO Left 11/13/2022   Procedure: CATARACT EXTRACTION PHACO AND INTRAOCULAR LENS PLACEMENT (IOC) LEFT;  Surgeon: Lockie Mola, MD;  Location: [facility];  Service: Ophthalmology;  Laterality: Left;  11.47 0:51.7   CHOLECYSTECTOMY     CYSTOSCOPY W/ URETERAL STENT PLACEMENT Left 05/13/2022   Procedure: CYSTOSCOPY WITH RETROGRADE PYELOGRAM/URETERAL LEFT STENT PLACEMENT;  Surgeon: Vanna Scotland, MD;  Location: [facility];  Service: Urology;  Laterality: Left;   CYSTOSCOPY WITH BIOPSY N/A 05/13/2022   Procedure: CYSTOSCOPY WITH BLADDER BIOPSY;  Surgeon: Vanna Scotland, MD;  Location: [facility];  Service: Urology;  Laterality: N/A;   gastroplication      KNEE ARTHROSCOPY Left 2011   PULSE GENERATOR IMPLANT Left 01/31/2020   Procedure: PLACEMENT RIGHT FLANK PULSE GENERATOR VS REMOVAL SPINAL CORD STIMULATOR;  Surgeon: Lucy Chris, MD;  Location: [facility];  Service: Neurosurgery;  Laterality: Left;   PULSE GENERATOR IMPLANT Left 04/24/2020   Procedure: REPLACEMENT LEFT FLANK PULSE GENERATOR IMPLANT;  Surgeon: Lucy Chris, MD;  Location: [facility];  Service: Neurosurgery;  Laterality: Left;  MAC w/ local   REPLACEMENT TOTAL KNEE Left    right arm fracture     RIGHT/LEFT HEART CATH AND CORONARY ANGIOGRAPHY N/A 08/28/2021   Procedure: RIGHT/LEFT HEART CATH AND CORONARY ANGIOGRAPHY;  Surgeon: Iran Ouch, MD;  Location: [facility];  Service: Cardiovascular;  Laterality: N/A;   SPINAL CORD STIMULATOR REMOVAL N/A  06/26/2020   Procedure: SPINAL CORD STIMULATOR REMOVAL;  Surgeon: Lucy Chris, MD;  Location: [facility];  Service: Neurosurgery;  Laterality: N/A;   THORACIC LAMINECTOMY FOR SPINAL CORD STIMULATOR N/A 01/24/2020   Procedure: THORACIC SPINAL CORD STIMULATOR PADDLE TRIAL VIA LAMINECTOMY;  Surgeon: Lucy Chris, MD;  Location: [facility];  Service: Neurosurgery;  Laterality: N/A;   TONSILLECTOMY AND ADENOIDECTOMY     TOTAL KNEE ARTHROPLASTY Left 04/17/2015   Procedure: LEFT TOTAL KNEE ARTHROPLASTY;  Surgeon: Durene Romans, MD;  Location: [facility];  Service: Orthopedics;  Laterality: Left;   Social History:  reports that she has never smoked. She has been exposed to tobacco smoke. She has never used smokeless tobacco. She reports that she does not drink alcohol and does not use drugs.  Allergies  Allergen Reactions   Vibegron Other (See Comments)   Family History  Problem Relation Age of Onset   Heart disease Mother    Hypertension Mother    Diabetes Mother    Heart attack Mother 74   Heart disease Father    Heart attack Father 13   Breast cancer Maternal Aunt    Heart attack Brother    Family history: Family history reviewed and not pertinent  Prior to Admission medications   Medication Sig Start Date End Date Taking? Authorizing Provider  acetaminophen (TYLENOL) 500 MG tablet Take 500 mg by mouth every 8 (eight) hours as needed for moderate pain.    [provider]  alendronate (FOSAMAX) 70 MG tablet Take 70 mg by mouth once a week.  10/23/19   [provider]  ALPRAZolam Prudy Feeler) 0.5 MG tablet Take 0.5 mg by mouth every 6 (six) hours as needed for anxiety.    [provider]  aspirin EC 81 MG tablet Take 1 tablet (81 mg total) by mouth at bedtime. Swallow whole. 10/25/20   McLean-Scocuzza, Pasty Spillers, MD  atorvastatin (LIPITOR) 20 MG tablet Take 1 tablet (20 mg total) by mouth daily. 06/20/20   McLean-Scocuzza, Pasty Spillers, MD  calcium-vitamin D (OSCAL WITH D) 500-5  MG-MCG tablet Take 1 tablet by mouth 2 (two) times daily.    [provider]  Calcium-Vitamin D (OYSTER SHELL/D PO) Take 1 tablet by mouth 2 (two) times daily.    [provider]  carboxymethylcellulose 1 % ophthalmic solution Place 1 drop into both eyes at bedtime.    [provider]  cetirizine (ZYRTEC) 5 MG tablet Take 5 mg by mouth daily.    [provider]  Cholecalciferol (VITAMIN D3) 125 MCG (5000 UT) TABS Take 1 tablet (5,000 Units total) by mouth daily. 10/09/18  McLean-Scocuzza, Pasty Spillers, MD  citalopram (CELEXA) 20 MG tablet Take 20 mg by mouth daily. 11/14/22   [provider]  Cranberry 450 MG TABS Take 1 tablet by mouth every morning. 01/03/22   [provider]  diclofenac Sodium (VOLTAREN) 1 % GEL Apply 2 g topically 4 (four) times daily.    [provider]  famotidine (PEPCID) 20 MG tablet Take 20 mg by mouth at bedtime.    [provider]  HYDROcodone-acetaminophen (NORCO/VICODIN) 5-325 MG tablet Take 1 tablet by mouth every 6 (six) hours as needed. 11/14/22   [provider]  hydrocortisone cream 1 % Apply 1 Application topically 2 (two) times daily as needed for itching.    [provider]  ketorolac (ACULAR) 0.5 % ophthalmic solution Place 1 drop into the left eye 4 (four) times daily. 11/13/22   [provider]  levothyroxine (SYNTHROID) 100 MCG tablet Take 1 tablet (100 mcg total) by mouth daily before breakfast. 03/06/22   Alford Highland, MD  losartan (COZAAR) 25 MG tablet Take 0.5 tablets (12.5 mg total) by mouth daily. 08/21/22 08/16/23  End, Cristal Deer, MD  melatonin 5 MG TABS Take 1 tablet (5 mg total) by mouth at bedtime. 04/04/21   Gillis Santa, MD  midodrine (PROAMATINE) 10 MG tablet Take 1 tablet (10 mg total) by mouth 3 (three) times daily with meals. 12/05/22   Furth, Cadence H, PA-C  montelukast (SINGULAIR) 10 MG tablet Take 1 tablet (10 mg total) by mouth at bedtime. 02/14/20    McLean-Scocuzza, Pasty Spillers, MD  moxifloxacin (VIGAMOX) 0.5 % ophthalmic solution Place 1 drop into the right eye 4 (four) times daily.    [provider]  nystatin powder Apply 1 Application topically every 6 (six) hours as needed (groin rash).    [provider]  omeprazole (PRILOSEC) 40 MG capsule Take 1 capsule (40 mg total) by mouth daily. After lunch 06/23/20   McLean-Scocuzza, Pasty Spillers, MD  ondansetron (ZOFRAN) 4 MG tablet Take 1 tablet (4 mg total) by mouth every 8 (eight) hours as needed for nausea or vomiting. 05/16/22   Meredeth Ide, MD  oxybutynin (DITROPAN) 5 MG tablet Take 1 tablet (5 mg total) by mouth every 8 (eight) hours as needed for bladder spasms. 05/13/22   Vanna Scotland, MD  OXYGEN Inhale into the lungs. Uses for exertion    [provider]  polyethylene glycol (MIRALAX / GLYCOLAX) 17 g packet Take 17 g by mouth daily.    [provider]  potassium chloride SA (KLOR-CON M) 20 MEQ tablet Take 20 mEq by mouth 2 (two) times daily.    [provider]  prednisoLONE acetate (PRED FORTE) 1 % ophthalmic suspension Place 1 drop into the left eye 4 (four) times daily. 11/13/22   [provider]  pregabalin (LYRICA) 150 MG capsule Take 150 mg by mouth at bedtime.    [provider]  pregabalin (LYRICA) 75 MG capsule Take 75 mg by mouth every morning.    [provider]  senna-docusate (SENOKOT-S) 8.6-50 MG tablet Take 2 tablets by mouth at bedtime.    [provider]  simethicone (MYLICON) 80 MG chewable tablet Chew 80 mg by mouth every 6 (six) hours as needed for flatulence.    [provider]  traZODone (DESYREL) 100 MG tablet Take 100 mg by mouth at bedtime. 12/13/21   [provider]  venlafaxine (EFFEXOR) 75 MG tablet Take 75 mg by mouth 2 (two) times daily.  [provider]   Physical Exam: Vitals:   03/26/23 1700 03/26/23 1715 03/26/23 1745 03/26/23 1800  BP: (!) 158/97      Pulse: 83 85 84 76  Resp: 20 19 20 14   Temp: 98.7 F (37.1 C)     TempSrc: Oral     SpO2: 98% 98% 94% 95%   Constitutional: appears frail, chronically ill, confused Eyes: PERRL, lids and conjunctivae normal ENMT: Mucous membranes are dry.  Posterior pharynx clear of any exudate or lesions. Age-appropriate dentition. Hearing appropriate Neck: normal, supple, no masses, no thyromegaly Respiratory: clear to auscultation bilaterally, no wheezing, no crackles. Normal respiratory effort. No accessory muscle use.  Cardiovascular: Regular rate and rhythm, no murmurs / rubs / gallops. No extremity edema. 2+ pedal pulses. No carotid bruits.  Abdomen: no tenderness, no masses palpated, no hepatosplenomegaly. Bowel sounds positive.  Musculoskeletal: no clubbing / cyanosis. No joint deformity upper and lower extremities. Good ROM, no contractures, no atrophy. Normal muscle tone.  Skin: no rashes, lesions, ulcers. No induration.  Bilateral upper extremity small ecchymosis, consistent with accidental trauma to the skin normal daily living Neurologic: Sensation intact.  Generalized weakness in all extremities. Psychiatric: Unable to assess judgment, insight, alertness, orientation, mood as patient has moderate-advanced dementia and recently diagnosed  EKG: independently reviewed, showing sinus tachycardia with rate of 99, QTc 556  Chest x-ray on Admission: I personally reviewed and I agree with radiologist reading as below.  CT HEAD WO CONTRAST ( )  Result Date: 03/26/2023 CLINICAL DATA:  Delirium. Recent diagnosis of dementia. Increasing confusion. EXAM: CT HEAD WITHOUT CONTRAST TECHNIQUE: Contiguous axial images were obtained from the base of the skull through the vertex without intravenous contrast. RADIATION DOSE REDUCTION: This exam was performed according to the departmental dose-optimization program which includes automated exposure control, adjustment of the mA and/or kV according to patient  size and/or use of iterative reconstruction technique. COMPARISON:  CT scan head from 10/22/2022. FINDINGS: Brain: No evidence of acute infarction, hemorrhage, hydrocephalus, extra-axial collection or mass lesion/mass effect. There is bilateral periventricular hypodensity, which is non-specific but most likely seen in the settings of microvascular ischemic changes. Mild in extent. Redemonstration of prominent ventricular system and cerebral volume loss. Vascular: No hyperdense vessel or unexpected calcification. Intracranial arteriosclerosis. Skull: Normal. Negative for fracture or focal lesion. Sinuses/Orbits: Moderate mucoperiosteal thickening with air-fluid levels in the both chambers of sphenoid sinus, new since the prior study, favoring sphenoid sinusitis. Mild-to-moderate mucoperiosteal thickening in the bilateral ethmoidal air cells, right frontoethmoidal recess and right frontal sinus, without air-fluid levels. Other: Bilateral mastoid air cells are patent. IMPRESSION: 1. No acute intracranial abnormality. 2. Stable cerebral volume loss and mild microvascular ischemic changes. 3. Interval development of sphenoid sinusitis. Electronically Signed   By: Jules Schick M.D.   On: 03/26/2023 16:21   CT ABDOMEN PELVIS W CONTRAST  Result Date: 03/26/2023 CLINICAL DATA:  Abdominal pain, acute, nonlocalized EXAM: CT ABDOMEN AND PELVIS WITH CONTRAST TECHNIQUE: Multidetector CT imaging of the abdomen and pelvis was performed using the standard protocol following bolus administration of intravenous contrast. RADIATION DOSE REDUCTION: This exam was performed according to the departmental dose-optimization program which includes automated exposure control, adjustment of the mA and/or kV according to patient size and/or use of iterative reconstruction technique. CONTRAST:  OMNIPAQUE IOHEXOL 300 MG/ML  SOLN COMPARISON:  CT scan abdomen and pelvis from 05/14/2022. FINDINGS: Examination is markedly limited due to  extensive streak artifacts from patient's thoracolumbar spinal fixation hardware. Lower chest: The lung bases  are clear. No pleural effusion. The heart is normal in size. No pericardial effusion. Hepatobiliary: The liver is normal in size. Non-cirrhotic configuration. No suspicious mass. These is diffuse hepatic steatosis. No intrahepatic or extrahepatic bile duct dilation. Gallbladder is surgically absent. Pancreas: Atrophic/fatty replaced pancreas. No pancreatic ductal dilatation or surrounding inflammatory changes. Spleen: Within normal limits. No focal lesion. Adrenals/Urinary Tract: Markedly limited evaluation. Adrenal glands are grossly unremarkable. No suspicious renal mass. There is a 1.8 x 2.0 cm sinus cyst arising from the left kidney upper pole. No hydronephrosis. No renal or ureteric calculi. Urinary bladder is under distended, precluding optimal assessment. However, no large mass or stones identified. No perivesical fat stranding. Note is made of mucosal hyperattenuation, which is nonspecific but can be seen with acute versus chronic cystitis. Correlate clinically and with urinalysis. Stomach/Bowel: Postsurgical changes noted at the GE junction, similar to the prior study. No disproportionate dilation of the small or large bowel loops. No evidence of abnormal bowel wall thickening or inflammatory changes. The appendix was not visualized; however there is no acute inflammatory process in the right lower quadrant. There are multiple diverticula mainly in the left hemi colon, without imaging signs of diverticulitis. Vascular/Lymphatic: No ascites or pneumoperitoneum. No abdominal or pelvic lymphadenopathy, by size criteria. No aneurysmal dilation of the major abdominal arteries. There are mild peripheral atherosclerotic vascular calcifications of the aorta and its major branches. Reproductive: The uterus is surgically absent. No large adnexal mass. Other: The visualized soft tissues and abdominal wall are  unremarkable. Musculoskeletal: No suspicious osseous lesions. Posterior spinal fixation of lower thoracic/lumbar spine noted. There are mild - moderate multilevel degenerative changes in the visualized spine. IMPRESSION: 1. Markedly limited examination due to extensive streak artifacts from patient's thoracolumbar spinal fixation hardware. 2. Mucosal hyperattenuation of urinary bladder, which can be seen with acute versus chronic cystitis. Correlate clinically and with urinalysis. No focal urinary bladder mass or calculi. 3. Otherwise, no acute inflammatory process identified within the abdomen or pelvis. 4. Multiple other nonacute observations, as described above. Aortic Atherosclerosis (ICD10-I70.0). Electronically Signed   By: Jules Schick M.D.   On: 03/26/2023 16:15   DG Chest Portable 1 View  Result Date: 03/26/2023 CLINICAL DATA:  Weakness EXAM: PORTABLE CHEST 1 VIEW COMPARISON:  X-ray 05/14/2022 FINDINGS: No consolidation, pneumothorax or effusion. No edema. Normal cardiopericardial silhouette with tortuous and ectatic aorta. Overlapping cardiac leads. Extensive fixation hardware along the spine. IMPRESSION: No acute cardiopulmonary disease. Electronically Signed   By: Karen Kays M.D.   On: 03/26/2023 15:05    Labs on Admission: I have personally reviewed following labs  CBC: Recent Labs  Lab 03/26/23 1304  WBC 7.1  NEUTROABS 5.5  HGB 15.5*  HCT 43.3  MCV 84.2  PLT 231   Basic Metabolic Panel: Recent Labs  Lab 03/26/23 1304  NA 130*  K 3.3*  CL 99  CO2 14*  GLUCOSE 127*  BUN 24*  CREATININE 0.70  CALCIUM 9.7  MG 1.7   GFR: CrCl cannot be calculated (Unknown ideal weight.).  Liver Function Tests: Recent Labs  Lab 03/26/23 1304  AST 90*  ALT 76*  ALKPHOS 82  BILITOT 1.3*  PROT 7.7  ALBUMIN 3.7   Thyroid Function Tests: Recent Labs    03/26/23 1304  TSH 2.044   Anemia Panel: No results for input(s): "VITAMINB12", "FOLATE", "FERRITIN", "TIBC", "IRON",  "RETICCTPCT" in the last 72 hours.  Urine analysis:    Component Value Date/Time   COLORURINE YELLOW (A) 03/26/2023 1534  APPEARANCEUR TURBID (A) 03/26/2023 1534   APPEARANCEUR Hazy (A) 04/10/2022 1401   LABSPEC >1.046 (H) 03/26/2023 1534   PHURINE 5.0 03/26/2023 1534   GLUCOSEU NEGATIVE 03/26/2023 1534   GLUCOSEU NEGATIVE 12/16/2017 1504   HGBUR MODERATE (A) 03/26/2023 1534   BILIRUBINUR NEGATIVE 03/26/2023 1534   BILIRUBINUR Negative 04/10/2022 1401   KETONESUR 20 (A) 03/26/2023 1534   PROTEINUR 100 (A) 03/26/2023 1534   UROBILINOGEN 0.2 12/16/2017 1504   NITRITE NEGATIVE 03/26/2023 1534   LEUKOCYTESUR LARGE (A) 03/26/2023 1534   This document was prepared using Dragon Voice Recognition software and may include unintentional dictation errors.  Dr. Sedalia Muta [facility]  If 7PM-7AM, please contact overnight-coverage provider If 7AM-7PM, please contact day attending provider [email or web address]  03/26/2023, 7:26 PM

## 2023-03-26 NOTE — Assessment & Plan Note (Signed)
With hemoconcentration, elevated BUN and metabolic acidosis Mucosa dry on presentation Status post LR 2 L bolus per EDP Sodium chloride infusion at 100 mL/h, 1 day ordered Recheck BMP in a.m.

## 2023-03-26 NOTE — Assessment & Plan Note (Signed)
Status was LR x 2 per EDP Potassium chloride 20 mill equivalent p.o. one-time dose ordered Magnesium checked on admission was 1.7, no replacement indicated at this time

## 2023-03-26 NOTE — Progress Notes (Signed)
Patient admitted to [facility] from ED,patient alert to self.Breathing freely on room air ,pt denies  pain.Follows simple command,patient cleaned of urine and stool.Tele box applied.Safety measures maintained

## 2023-03-26 NOTE — Assessment & Plan Note (Signed)
Diagnosed approximately 1 month ago Oriented to name only

## 2023-03-26 NOTE — ED Triage Notes (Signed)
Pt. To ED via EMS from [facility] for AMS/failure to thrive. Per facility pt. Was diagnosed with dementia 1 month ago, and has been having increasing confusion, with recent onset lack of appetite. Staff states pt. Is not eating or drinking. Diagnosed with covid 3 days pta.

## 2023-03-26 NOTE — Assessment & Plan Note (Signed)
Not controlled on admission Home losartan 12.5 mg daily resumed

## 2023-03-26 NOTE — ED Provider Notes (Signed)
[facility] Provider Note    Event Date/Time   First MD Initiated Contact with Patient 03/26/23 1301     (approximate)   History   Altered Mental Status   HPI  Kimberly Cook is a 77 y.o. female  here with AMS. History limited 2/2 confusion. Per report, pt has been increasingly confused over past 1-2 days. H/o dementia but is less responsive and has been eating/drinking less. Was apparently just diagnosed with dementia recently. Diagnosed with COVID 3 days ago as well. She is unable to provide much history ot me, does not recall why she is here. Denies any CP, SOB, or abdominal pain.      Physical Exam   Triage Vital Signs: ED Triage Vitals  Encounter Vitals Group     BP 03/26/23 1255 (!) 160/111     Systolic BP Percentile --      Diastolic BP Percentile --      Pulse Rate 03/26/23 1255 98     Resp 03/26/23 1255 (!) 25     Temp 03/26/23 1257 98.8 F (37.1 C)     Temp src --      SpO2 03/26/23 1255 97 %     Weight --      Height --      Head Circumference --      Peak Flow --      Pain Score 03/26/23 1258 0     Pain Loc --      Pain Education --      Exclude from Growth Chart --     Most recent vital signs: Vitals:   03/26/23 1500 03/26/23 1530  BP: (!) 154/102 (!) 162/119  Pulse: 93 93  Resp: 18 (!) 24  Temp:    SpO2: 96% 100%     General: Awake, no distress.  CV:  Good peripheral perfusion. RRR. Resp:  Normal work of breathing. Lungs clear. Abd:  No distention. No tenderness. No rebound or guarding. Other:  MMM. Oriented to person only. Strength 5/5 bl UE and LE. Normal sensation to light touch.   ED Results / Procedures / Treatments   Labs (all labs ordered are listed, but only abnormal results are displayed) Labs Reviewed  CBC WITH DIFFERENTIAL/PLATELET - Abnormal; Notable for the following components:      Result Value   RBC 5.14 (*)    Hemoglobin 15.5 (*)    All other components within normal limits   COMPREHENSIVE METABOLIC PANEL - Abnormal; Notable for the following components:   Sodium 130 (*)    Potassium 3.3 (*)    CO2 14 (*)    Glucose, Bld 127 (*)    BUN 24 (*)    AST 90 (*)    ALT 76 (*)    Total Bilirubin 1.3 (*)    Anion gap 17 (*)    All other components within normal limits  SARS CORONAVIRUS 2 BY RT PCR  TSH  URINALYSIS, ROUTINE W REFLEX MICROSCOPIC  MAGNESIUM  PROCALCITONIN  TROPONIN I (HIGH SENSITIVITY)     EKG Sinus tachycardia, VR (9. PR 138, QRS 75, QTC 556. No acute ST elevations or depressions.   RADIOLOGY CXR: No acute abnormality   I also independently reviewed and agree with radiologist interpretations.   PROCEDURES:  Critical Care performed: No   MEDICATIONS ORDERED IN ED: Medications  levothyroxine (SYNTHROID) tablet 100 mcg (has no administration in time range)  acetaminophen (TYLENOL) tablet 650 mg (has no administration in time  range)    Or  acetaminophen (TYLENOL) suppository 650 mg (has no administration in time range)  ondansetron (ZOFRAN) tablet 4 mg (has no administration in time range)    Or  ondansetron (ZOFRAN) injection 4 mg (has no administration in time range)  enoxaparin (LOVENOX) injection 40 mg (has no administration in time range)  senna-docusate (Senokot-S) tablet 1 tablet (has no administration in time range)  lactated ringers bolus 1,000 mL (1,000 mLs Intravenous New Bag/Given 03/26/23 1531)  lactated ringers bolus 1,000 mL (1,000 mLs Intravenous New Bag/Given 03/26/23 1529)  iohexol (OMNIPAQUE) 300 MG/ML solution 100 mL (100 mLs Intravenous Contrast Given 03/26/23 1437)     IMPRESSION / MDM / ASSESSMENT AND PLAN / ED COURSE  I reviewed the triage vital signs and the nursing notes.                              Differential diagnosis includes, but is not limited to, encephalopathy 2/2 UTI, PNA, AKI, CVA, polypharmacy, SDH  Patient's presentation is most consistent with acute presentation with potential threat to  life or bodily function.  The patient is on the cardiac monitor to evaluate for evidence of arrhythmia and/or significant heart rate changes  77 yo F here with AMS, lack of appetite in setting of recent COVID diagnosis. Pt eating/drinking much less than usual. VSS. Labs show significant dehydration with Na 130, CO2 14, BUN/Cr ratio elevation and mild transaminitis. UA pending. CXR clear. CT A/P obtained, concerning for UTI per my prelim review but awaiting results. Will admit for IVF, management of acute encephalopathy 2/2 dehydration and COVID.    FINAL CLINICAL IMPRESSION(S) / ED DIAGNOSES   Final diagnoses:  Dehydration  Metabolic acidosis  Hyponatremia     Rx / DC Orders   ED Discharge Orders     None        Note:  This document was prepared using Dragon voice recognition software and may include unintentional dictation errors.   Shaune Pollack, MD 03/26/23 [phone]

## 2023-03-26 NOTE — Hospital Course (Addendum)
Kimberly Cook is a 77 year old female with history of dementia, recent COVID-19 infection, hyperlipidemia, hypertension, neuropathy, insomnia, depression, anxiety, who presents to the emergency department for chief concerns of altered mental status, failure to thrive.  Patient is from [facility].  Vitals in the ED showed temperature of 98.8, respiration rate of 17, heart rate of 100, blood pressure 160/111, SpO2 of 100% on room air.  Serum sodium is 130, potassium 3.3, chloride 99, bicarb 14, BUN of 24, serum creatinine 0.70, eGFR greater than 60, nonfasting blood glucose 127, WBC 7.1, hemoglobin 15.5, platelets of 231.  High sensitivity troponin was 13.  TSH was 2.044.  ED treatment: LR 2 L bolus x 2.  8/22: Vital stable.  Likely secondary to recent COVID-19 infection with poor p.o. intake. Labs with improving hyponatremia with sodium at 132, potassium 3, CO2 19.  Procalcitonin 0.11.  Replating more potassium and magnesium as magnesium was borderline yesterday.  Not participating with active care, refusing mostly p.o. intake. Swallow evaluation was also ordered.  Per son and DIL, patient was recently taken away her financial privilege due to advanced dementia, since then she appears mostly depressed, poor p.o. intake and failure to thrive picture at her facility.  Palliative care was also consulted.  8/23: Mildly elevated blood pressure at 142/105.  Refusing all p.o. intake and pocketing food and medication and then spitting it out.  Per son this is being going on for couple of weeks. Pending palliative care consult.  8/24: Patient continued to refuse p.o. intake, pocketing food, family would like to see her response over the weekend before decided about full comfort care.  8/25: Patient continued to refuse p.o. intake but little more interactive with family today, family is not requesting trial of PEG tube for feeding.  IR was consulted, patient has not taken aspirin since in the  hospital as refusing all p.o. intake, it was discontinued from med orders, as requested by IR.  8/26: Patient with significant improvement today, started drinking ginger ale and Ensure, she does not want a feeding tube.  More alert and interactive.  Willing to try food.  Will be high risk for refeeding.  8/27: Patient was very lethargic and somnolent this morning, unable to take any p.o.  Apparently received Xanax for agitation overnight, should avoid any sedating medications.  8/28: Patient more alert but again refusing p.o. intake.  Replacing magnesium and potassium. Ongoing goals of care discussion regarding going back to facility with hospice which seems appropriate at this point.  8/29: Patient with worsening lethargy and continued to refuse p.o. intake.  Another lengthy discussion with family and they decided not to proceed with comfort care.  They would like to explore option for inpatient hospice.  Hospice referral was given by TOC-adding evaluation.

## 2023-03-27 DIAGNOSIS — E860 Dehydration: Secondary | ICD-10-CM | POA: Diagnosis present

## 2023-03-27 DIAGNOSIS — G9341 Metabolic encephalopathy: Secondary | ICD-10-CM | POA: Diagnosis present

## 2023-03-27 DIAGNOSIS — N182 Chronic kidney disease, stage 2 (mild): Secondary | ICD-10-CM | POA: Diagnosis present

## 2023-03-27 DIAGNOSIS — I5022 Chronic systolic (congestive) heart failure: Secondary | ICD-10-CM | POA: Diagnosis present

## 2023-03-27 DIAGNOSIS — F0390 Unspecified dementia without behavioral disturbance: Secondary | ICD-10-CM | POA: Diagnosis not present

## 2023-03-27 DIAGNOSIS — K219 Gastro-esophageal reflux disease without esophagitis: Secondary | ICD-10-CM | POA: Diagnosis present

## 2023-03-27 DIAGNOSIS — R531 Weakness: Secondary | ICD-10-CM | POA: Diagnosis not present

## 2023-03-27 DIAGNOSIS — E669 Obesity, unspecified: Secondary | ICD-10-CM | POA: Diagnosis present

## 2023-03-27 DIAGNOSIS — F0393 Unspecified dementia, unspecified severity, with mood disturbance: Secondary | ICD-10-CM | POA: Diagnosis present

## 2023-03-27 DIAGNOSIS — Z66 Do not resuscitate: Secondary | ICD-10-CM | POA: Diagnosis present

## 2023-03-27 DIAGNOSIS — I700 Atherosclerosis of aorta: Secondary | ICD-10-CM | POA: Diagnosis present

## 2023-03-27 DIAGNOSIS — R410 Disorientation, unspecified: Secondary | ICD-10-CM | POA: Diagnosis not present

## 2023-03-27 DIAGNOSIS — R4182 Altered mental status, unspecified: Secondary | ICD-10-CM | POA: Diagnosis present

## 2023-03-27 DIAGNOSIS — I10 Essential (primary) hypertension: Secondary | ICD-10-CM

## 2023-03-27 DIAGNOSIS — I3139 Other pericardial effusion (noninflammatory): Secondary | ICD-10-CM | POA: Diagnosis present

## 2023-03-27 DIAGNOSIS — I428 Other cardiomyopathies: Secondary | ICD-10-CM | POA: Diagnosis present

## 2023-03-27 DIAGNOSIS — E785 Hyperlipidemia, unspecified: Secondary | ICD-10-CM | POA: Diagnosis present

## 2023-03-27 DIAGNOSIS — Z515 Encounter for palliative care: Secondary | ICD-10-CM | POA: Diagnosis not present

## 2023-03-27 DIAGNOSIS — E876 Hypokalemia: Secondary | ICD-10-CM | POA: Diagnosis present

## 2023-03-27 DIAGNOSIS — Z743 Need for continuous supervision: Secondary | ICD-10-CM | POA: Diagnosis not present

## 2023-03-27 DIAGNOSIS — I959 Hypotension, unspecified: Secondary | ICD-10-CM | POA: Diagnosis not present

## 2023-03-27 DIAGNOSIS — F32A Depression, unspecified: Secondary | ICD-10-CM | POA: Diagnosis present

## 2023-03-27 DIAGNOSIS — U071 COVID-19: Secondary | ICD-10-CM | POA: Diagnosis present

## 2023-03-27 DIAGNOSIS — E872 Acidosis, unspecified: Secondary | ICD-10-CM | POA: Diagnosis present

## 2023-03-27 DIAGNOSIS — I2510 Atherosclerotic heart disease of native coronary artery without angina pectoris: Secondary | ICD-10-CM | POA: Diagnosis present

## 2023-03-27 DIAGNOSIS — E039 Hypothyroidism, unspecified: Secondary | ICD-10-CM | POA: Diagnosis present

## 2023-03-27 DIAGNOSIS — I5020 Unspecified systolic (congestive) heart failure: Secondary | ICD-10-CM | POA: Diagnosis not present

## 2023-03-27 DIAGNOSIS — E871 Hypo-osmolality and hyponatremia: Secondary | ICD-10-CM | POA: Diagnosis present

## 2023-03-27 DIAGNOSIS — R627 Adult failure to thrive: Secondary | ICD-10-CM | POA: Diagnosis not present

## 2023-03-27 DIAGNOSIS — I130 Hypertensive heart and chronic kidney disease with heart failure and stage 1 through stage 4 chronic kidney disease, or unspecified chronic kidney disease: Secondary | ICD-10-CM | POA: Diagnosis present

## 2023-03-27 DIAGNOSIS — F0394 Unspecified dementia, unspecified severity, with anxiety: Secondary | ICD-10-CM | POA: Diagnosis present

## 2023-03-27 LAB — CBC
HCT: 35.5 % — ABNORMAL LOW (ref 36.0–46.0)
Hemoglobin: 12.7 g/dL (ref 12.0–15.0)
MCH: 30.3 pg (ref 26.0–34.0)
MCHC: 35.8 g/dL (ref 30.0–36.0)
MCV: 84.7 fL (ref 80.0–100.0)
Platelets: 153 K/uL (ref 150–400)
RBC: 4.19 MIL/uL (ref 3.87–5.11)
RDW: 12.4 % (ref 11.5–15.5)
WBC: 5 K/uL (ref 4.0–10.5)
nRBC: 0 % (ref 0.0–0.2)

## 2023-03-27 LAB — BASIC METABOLIC PANEL WITH GFR
Anion gap: 12 (ref 5–15)
BUN: 14 mg/dL (ref 8–23)
CO2: 19 mmol/L — ABNORMAL LOW (ref 22–32)
Calcium: 8.3 mg/dL — ABNORMAL LOW (ref 8.9–10.3)
Chloride: 101 mmol/L (ref 98–111)
Creatinine, Ser: 0.57 mg/dL (ref 0.44–1.00)
GFR, Estimated: >60 mL/min
Glucose, Bld: 91 mg/dL (ref 70–99)
Potassium: 3 mmol/L — ABNORMAL LOW (ref 3.5–5.1)
Sodium: 132 mmol/L — ABNORMAL LOW (ref 135–145)

## 2023-03-27 LAB — MRSA NEXT GEN BY PCR, NASAL: MRSA by PCR Next Gen: NOT DETECTED

## 2023-03-27 MED ORDER — ATORVASTATIN CALCIUM 20 MG PO TABS
20.0000 mg | ORAL_TABLET | Freq: Every day | ORAL | Status: DC
  Administered 2023-04-02: 20 mg via ORAL
  Filled 2023-03-27 (×2): qty 1

## 2023-03-27 MED ORDER — SODIUM CHLORIDE 0.9 % IV SOLN: INTRAVENOUS | Status: DC | PRN

## 2023-03-27 MED ORDER — PANTOPRAZOLE SODIUM 40 MG PO TBEC
40.0000 mg | DELAYED_RELEASE_TABLET | Freq: Every day | ORAL | Status: DC
  Administered 2023-04-02: 40 mg via ORAL
  Filled 2023-03-27 (×2): qty 1

## 2023-03-27 MED ORDER — OYSTER SHELL CALCIUM/D3 500-5 MG-MCG PO TABS
1.0000 | ORAL_TABLET | Freq: Two times a day (BID) | ORAL | Status: DC
  Administered 2023-03-31 – 2023-04-02 (×2): 1 via ORAL
  Filled 2023-03-27 (×3): qty 1

## 2023-03-27 MED ORDER — POTASSIUM CHLORIDE 10 MEQ/100ML IV SOLN
10.0000 meq | INTRAVENOUS | Status: AC
  Administered 2023-03-27 (×6): 10 meq via INTRAVENOUS
  Filled 2023-03-27 (×6): qty 100

## 2023-03-27 MED ORDER — SODIUM CHLORIDE 0.9 % IV SOLN: INTRAVENOUS | Status: AC

## 2023-03-27 MED ORDER — MAGNESIUM SULFATE 2 GM/50ML IV SOLN
2.0000 g | Freq: Once | INTRAVENOUS | Status: AC
  Administered 2023-03-27: 2 g via INTRAVENOUS
  Filled 2023-03-27: qty 50

## 2023-03-27 MED ORDER — PHENOL 1.4 % MT LIQD: 1.0000 | OROMUCOSAL | Status: DC | PRN

## 2023-03-27 MED ORDER — GERHARDT'S BUTT CREAM
TOPICAL_CREAM | Freq: Three times a day (TID) | CUTANEOUS | Status: DC
  Administered 2023-03-29 (×2): 1 via TOPICAL
  Filled 2023-03-27 (×2): qty 1

## 2023-03-27 MED ORDER — ASPIRIN 81 MG PO TBEC: 81.0000 mg | DELAYED_RELEASE_TABLET | Freq: Every day | ORAL | Status: DC

## 2023-03-27 MED ORDER — POLYETHYLENE GLYCOL 3350 17 G PO PACK: 17.0000 g | PACK | Freq: Every day | ORAL | Status: DC

## 2023-03-27 NOTE — Assessment & Plan Note (Signed)
-   Continue home aspirin and statin 

## 2023-03-27 NOTE — Assessment & Plan Note (Signed)
-   Continue home statin °

## 2023-03-27 NOTE — Progress Notes (Signed)
Progress Note   Patient: Kimberly Cook WUJ:[id number] DOB: 04-18-1946 DOA: 03/26/2023     0 DOS: the patient was seen and examined on 03/27/2023   Brief hospital course: [name] is a 77 year old female with history of dementia, recent COVID-19 infection, hyperlipidemia, hypertension, neuropathy, insomnia, depression, anxiety, who presents to the emergency department for chief concerns of altered mental status, failure to thrive.  Patient is from [facility].  Vitals in the ED showed temperature of 98.8, respiration rate of 17, heart rate of 100, blood pressure 160/111, SpO2 of 100% on room air.  Serum sodium is 130, potassium 3.3, chloride 99, bicarb 14, BUN of 24, serum creatinine 0.70, eGFR greater than 60, nonfasting blood glucose 127, WBC 7.1, hemoglobin 15.5, platelets of 231.  High sensitivity troponin was 13.  TSH was 2.044.  ED treatment: LR 2 L bolus x 2.  8/22: Vital stable.  Likely secondary to recent COVID-19 infection with poor p.o. intake. Labs with improving hyponatremia with sodium at 132, potassium 3, CO2 19.  Procalcitonin 0.11.  Replating more potassium and magnesium as magnesium was borderline yesterday.  Not participating with active care, refusing mostly p.o. intake. Swallow evaluation was also ordered.  Per son and DIL, patient was recently taken away her financial privilege due to advanced dementia, since then she appears mostly depressed, poor p.o. intake and failure to thrive picture at her facility.  Palliative care was also consulted.  Assessment and Plan: * Altered mental status I suspect this is multifactorial in setting of recent COVID-19 infection along with poor appetite leading to clinical dehydration.  History of advanced dementia and worsening depression by family.  Patient was not eating well over the past couple of weeks at facility.  Concern of failure to thrive. CT head without any acute abnormality. Treat symptomatically Patient  received IV fluid in ED. -Swallow evaluation pending as patient is keeping everything in her mouth -Continue IV fluid -Palliative care consult  Generalized weakness Patient with advanced dementia, oriented to name only. Concern of failure to thrive.  Recent COVID infection is contributory. -PT/OT evaluation once able to participate  Dehydration With hemoconcentration, elevated BUN and metabolic acidosis Mucosa dry on presentation Status post LR 2 L bolus per EDP Sodium chloride infusion at 100 mL/h, 1 day ordered Recheck BMP in a.m.  Essential hypertension Not controlled on admission Home losartan 12.5 mg daily resumed  Hypokalemia Further decrease in potassium and borderline magnesium. Refusing most of the p.o. intake. -Give IV potassium and magnesium replacement -Continue to monitor  Hypothyroidism Home levothyroxine 100 mcg daily before breakfast resumed  Dementia without behavioral disturbance (HCC) Diagnosed approximately 1 month ago Oriented to name only   Anxiety and depression Home Celexa 20 mg daily, trazodone 100 mg nightly for resumed Alprazolam 0.5 mg p.o. every 6 hours as needed for anxiety resumed  Hyperlipidemia -Continue home statin  GERD (gastroesophageal reflux disease) -Continue PPI  Coronary artery disease involving native coronary artery of native heart without angina pectoris -Continue home aspirin and statin   Subjective: Patient was seen and examined today.  Unable to answer any questions appropriately.  Mucous membranes appears dry.  Only stated yes when asked about pain and pointed towards the IV line.  Physical Exam: Vitals:   03/27/23 0124 03/27/23 0820 03/27/23 0827 03/27/23 1446  BP: (!) 161/107 (!) 131/99 (!) 142/84 (!) 146/93  Pulse: 93 95  91  Resp: 20 18  20   Temp: 97.8 F (36.6 C) 98.4 F (36.9  C)  98.6 F (37 C)  TempSrc: Oral Axillary    SpO2: 100% 100%  99%  Weight:      Height:       General.  Frail elderly lady,  lying in no acute distress. Pulmonary.  Lungs clear bilaterally, normal respiratory effort. CV.  Regular rate and rhythm, no JVD, rub or murmur. Abdomen.  Soft, nontender, nondistended, BS positive. CNS.  Alert and oriented to name only.   Extremities.  No edema, no cyanosis, pulses intact and symmetrical. Psychiatry.  Judgment and insight appears impaired  Data Reviewed: Prior data reviewed  Family Communication:   Disposition: Status is: Inpatient Remains inpatient appropriate because: Severity of illness  Planned Discharge Destination: Skilled nursing facility  DVT prophylaxis.  Lovenox Time spent: 50 minutes  This record has been created using Conservation officer, historic buildings. Errors have been sought and corrected,but may not always be located. Such creation errors do not reflect on the standard of care.   Author: Arnetha Courser, MD 03/27/2023 4:10 PM  For on call review [email or web address].

## 2023-03-27 NOTE — Plan of Care (Signed)
  Problem: Safety: Goal: Ability to remain free from injury will improve Outcome: Progressing   Problem: Pain Managment: Goal: General experience of comfort will improve Outcome: Progressing   Problem: Skin Integrity: Goal: Risk for impaired skin integrity will decrease Outcome: Progressing   

## 2023-03-27 NOTE — Progress Notes (Signed)
Spoke with son and daughter-in-law, Kimberly Cook and Kimberly Cook, to provide update on patient.  They share that patient has barely eaten or drank anything in the last three weeks or so.  They also relate that over the last several months patient has lost any motivation to get up and move, and now is primarily bedbound.  They also noted a significant decline in cognition, particularly with this current illness.  Info relayed to Dr. Nelson Chimes.

## 2023-03-27 NOTE — Plan of Care (Signed)
  Problem: Clinical Measurements: Goal: Respiratory complications will improve Outcome: Progressing Goal: Cardiovascular complication will be avoided Outcome: Progressing   Problem: Nutrition: Goal: Adequate nutrition will be maintained Outcome: Not Progressing   Problem: Safety: Goal: Ability to remain free from injury will improve Outcome: Progressing

## 2023-03-27 NOTE — Assessment & Plan Note (Signed)
Continue PPI ?

## 2023-03-27 NOTE — TOC Initial Note (Signed)
Transition of Care [facility]) - Initial/Assessment Note    Patient Details  Name: Kimberly Cook MRN: [id number] Date of Birth: 02/25/1946  [facility] CM/SW Contact:    Margarito Liner, LCSW Phone Number: 03/27/2023, 10:05 AM  Clinical Narrative:  Patient is only oriented to self. Per chart review, patient admitted from [facility]. Admissions coordinator confirmed she is a long-term resident. CSW will continue to follow progress and facilitate return to SNF once medically stable.                Expected Discharge Plan: Skilled Nursing Facility Barriers to Discharge: Continued Medical Work up   Patient Goals and CMS Choice            Expected Discharge Plan and Services       Living arrangements for the past 2 months: Skilled Nursing Facility                                      Prior Living Arrangements/Services Living arrangements for the past 2 months: Skilled Nursing Facility Lives with:: Facility Resident Patient language and need for interpreter reviewed:: Yes        Need for Family Participation in Patient Care: Yes (Comment) Care giver support system in place?: Yes (comment)   Criminal Activity/Legal Involvement Pertinent to Current Situation/Hospitalization: No - Comment as needed  Activities of Daily Living      Permission Sought/Granted Permission sought to share information with : Photographer granted to share info w AGENCY: [facility]        Emotional Assessment   Attitude/Demeanor/Rapport: Unable to Assess Affect (typically observed): Unable to Assess Orientation: : Oriented to Self Alcohol / Substance Use: Not Applicable Psych Involvement: No (comment)  Admission diagnosis:  Dehydration [E86.0] Hyponatremia [E87.1] Altered mental status [R41.82] Metabolic acidosis [E87.20] Patient Active Problem List   Diagnosis Date Noted   Altered mental status 03/26/2023    Dehydration 03/26/2023   Dementia without behavioral disturbance (HCC) 03/26/2023   Closed T7 spinal fracture (HCC) 10/24/2022   T7 vertebral fracture (HCC) 10/23/2022   Cystitis 05/15/2022   Acute cystitis 05/14/2022   Emesis 05/14/2022   Elevated troponin 05/14/2022   Wound infection 04/17/2022   Acquired scoliosis    Hypotension 04/13/2022   Post-concussion headache 04/13/2022   HFrEF (heart failure with reduced ejection fraction) (HCC) 04/11/2022   Peripheral neuropathy 04/11/2022   Hypokalemia 04/11/2022   Falls, initial encounter    Acute renal failure superimposed on stage 3a chronic kidney disease (HCC) 03/04/2022   UTI (urinary tract infection) 03/03/2022   Toxic metabolic encephalopathy 03/02/2022   Obesity (BMI 30-39.9) 03/02/2022   Exercise intolerance 03/02/2022   Left leg numbness 03/02/2022   Pericardial effusion 01/04/2022   History of stroke 08/29/2021   Neuropathy 08/29/2021   NICM (nonischemic cardiomyopathy) (HCC)    Generalized weakness    Bilateral pleural effusion    Chest pain    Pancytopenia (HCC)    Acute on chronic HFrEF (heart failure with reduced ejection fraction) (HCC)    Malnutrition of moderate degree 04/03/2021   Metabolic encephalopathy 03/31/2021   Failed spinal cord stimulator, subsequent encounter 08/08/2020   S/P insertion of spinal cord stimulator 04/25/2020   Lumbar radiculopathy 12/07/2019   Lumbar facet arthropathy 12/07/2019   Spinal stenosis, lumbar region, with neurogenic claudication 12/07/2019  Osteoporosis 11/24/2019   Lumbar herniated disc 06/09/2019   Lumbar degenerative disc disease 06/09/2019   Spinal stenosis of cervical region 05/07/2019   Lumbar spondylosis 05/07/2019   Recurrent UTI 05/07/2019   Abnormal MRI, lumbar spine 02/18/2019   Abnormal gait 02/18/2019   Back pain 02/18/2019   Osteoarthritis 10/09/2018   Overactive bladder 10/09/2018   Chronic midline low back pain 04/22/2018   Chronic pain of left knee  04/22/2018   Toe fracture, left 03/10/2018   Lightheadedness 01/21/2018   PAC (premature atrial contraction) 01/19/2018   PVC's (premature ventricular contractions) 01/19/2018   Hernia, paraesophageal 12/16/2017   Kidney cysts 12/16/2017   CKD Stage IIIa 12/16/2017   Carotid artery stenosis 12/16/2017   Chronic pain 12/16/2017   Osteopenia 12/16/2017   DDD (degenerative disc disease), cervical 12/16/2017   Labile hypertension 09/05/2017   Anxiety and depression 08/27/2017   Insomnia 08/27/2017   Syncope 08/27/2017   Bilateral occipital neuralgia 08/27/2017   Lung nodule 06/30/2017   Coronary artery disease involving native coronary artery of native heart without angina pectoris 06/03/2017   SOB (shortness of breath) 04/11/2017   Labile blood pressure 03/08/2017   Syncope, near 03/08/2017   Palpitations 03/08/2017   Orthostatic lightheadedness 03/08/2017   Essential hypertension    S/P TKR (total knee replacement) 04/17/2015   Morbid obesity (HCC) 09/21/2014   Medication management 09/21/2014   Hypothyroidism 02/17/2014   Hyperlipidemia    GERD (gastroesophageal reflux disease)    Vitamin D deficiency    Depression    PCP:  [facility]:   [facility] - [place], [place] - [address] [place] [address] Phone: [phone] Fax: [phone]  [facility] - [place], [place] - [address] [place] [address] Phone: [phone] Fax: [phone]  [facility] - [place], [place] - [address] [place] [address] Phone: [phone] Fax: [phone]     Social Determinants of Health (SDOH) Social History: SDOH Screenings   Food Insecurity: No Food Insecurity (10/23/2022)  Housing: Low Risk  (10/23/2022)  Transportation Needs: No Transportation Needs (10/23/2022)  Utilities: Not At Risk (10/23/2022)  Depression (PHQ2-9): Low Risk   (03/28/2022)  Financial Resource Strain: Low Risk  (07/24/2020)  Physical Activity: Unknown (07/24/2020)  Social Connections: Unknown (07/24/2020)  Stress: No Stress Concern Present (07/24/2020)  Tobacco Use: Medium Risk (03/26/2023)   SDOH Interventions:     Readmission Risk Interventions     No data to display

## 2023-03-27 NOTE — Progress Notes (Signed)
Initial Nutrition Assessment  DOCUMENTATION CODES:   Not applicable  INTERVENTION:   RD will add supplements with diet advancement   MVI po daily with diet advancement   Pt at high refeed risk; recommend monitor potassium, magnesium and phosphorus labs daily until stable  Daily weights   NUTRITION DIAGNOSIS:   Inadequate oral intake related to acute illness as evidenced by meal completion < 25%.  GOAL:   Patient will meet greater than or equal to 90% of their needs  MONITOR:   PO intake, Supplement acceptance, Labs, Weight trends, I & O's, Skin  REASON FOR ASSESSMENT:   Malnutrition Screening Tool    ASSESSMENT:   77 y/o female with h/o HLD, GERD s/p gastroplication, hypothyroid disease, HTN, CAD, anxiety, depression, CKD III, dementia, DDD, chronic pain, CHF, NICM and hiatal hernia who is admitted with COVID 19.  Met with pt in room today. Pt is able to tell this RD hello and that she is feeling bad but is really not able to provide any history. Per chart review, pt noted to have poor appetite and oral intake for the past 3 weeks. Per chart, pt is down 24lbs(13%) over the past 2 months if her admission weight is correct; this would be significant weight loss. Pt is currently NPO pending SLP evaluation. RN reports that pt would not eat applesauce with her medications today. Pt shakes her head "no" when asked if she drinks Boost or Ensure. RD will add supplements and MVI with diet advancement. Pt is at high refeed risk. Palliative care consult pending.   Medications reviewed and include: aspirin, oscal w/ D, celexa, lovenox, synthroid, melatonin, protonix, miralax, NaCl @100ml /hr  Labs reviewed: Na 132(L), K 3.0(L) Mg 1.7 wnl- 8/21  NUTRITION - FOCUSED PHYSICAL EXAM:  Flowsheet Row Most Recent Value  Orbital Region No depletion  Upper Arm Region Mild depletion  Thoracic and Lumbar Region No depletion  Buccal Region No depletion  Temple Region No depletion  Clavicle  Bone Region Mild depletion  Clavicle and Acromion Bone Region Mild depletion  Scapular Bone Region No depletion  Dorsal Hand Mild depletion  Patellar Region Moderate depletion  Anterior Thigh Region Moderate depletion  Posterior Calf Region Moderate depletion  Edema (RD Assessment) None  Hair Reviewed  Eyes Reviewed  Mouth Reviewed  Skin Reviewed  Nails Reviewed   Diet Order:   Diet Order             Diet NPO time specified Except for: Sips with Meds, Ice Chips  Diet effective now                  EDUCATION NEEDS:   Not appropriate for education at this time  Skin:  Skin Assessment: Reviewed RN Assessment  Last BM:  8/22- TYPE 6  Height:   Ht Readings from Last 1 Encounters:  03/26/23 5\' 4"  (1.626 m)    Weight:   Wt Readings from Last 1 Encounters:  03/26/23 74.6 kg    Ideal Body Weight:  54.5 kg  BMI:  Body mass index is 28.23 kg/m.  Estimated Nutritional Needs:   Kcal:  1700-1900kcal/day  Protein:  85-95g/day  Fluid:  1.4-1.6L/day  Kimberly Holiday MS, RD, LDN Please refer to [facility] for RD and/or RD on-call/weekend/after hours pager

## 2023-03-27 NOTE — Assessment & Plan Note (Signed)
Patient with advanced dementia, oriented to name only. Concern of failure to thrive.  Recent COVID infection is contributory. -PT/OT evaluation once able to participate

## 2023-03-28 DIAGNOSIS — R627 Adult failure to thrive: Secondary | ICD-10-CM | POA: Diagnosis not present

## 2023-03-28 DIAGNOSIS — R4182 Altered mental status, unspecified: Secondary | ICD-10-CM | POA: Diagnosis not present

## 2023-03-28 DIAGNOSIS — U071 COVID-19: Secondary | ICD-10-CM | POA: Diagnosis not present

## 2023-03-28 DIAGNOSIS — I5020 Unspecified systolic (congestive) heart failure: Secondary | ICD-10-CM | POA: Diagnosis not present

## 2023-03-28 DIAGNOSIS — R531 Weakness: Secondary | ICD-10-CM | POA: Diagnosis not present

## 2023-03-28 DIAGNOSIS — R410 Disorientation, unspecified: Secondary | ICD-10-CM | POA: Diagnosis not present

## 2023-03-28 DIAGNOSIS — E860 Dehydration: Secondary | ICD-10-CM | POA: Diagnosis not present

## 2023-03-28 DIAGNOSIS — F0390 Unspecified dementia without behavioral disturbance: Secondary | ICD-10-CM | POA: Diagnosis not present

## 2023-03-28 DIAGNOSIS — I10 Essential (primary) hypertension: Secondary | ICD-10-CM | POA: Diagnosis not present

## 2023-03-28 DIAGNOSIS — I959 Hypotension, unspecified: Secondary | ICD-10-CM | POA: Diagnosis not present

## 2023-03-28 DIAGNOSIS — Z515 Encounter for palliative care: Secondary | ICD-10-CM

## 2023-03-28 LAB — BASIC METABOLIC PANEL
Anion gap: 20 — ABNORMAL HIGH (ref 5–15)
BUN: 6 mg/dL — ABNORMAL LOW (ref 8–23)
CO2: 12 mmol/L — ABNORMAL LOW (ref 22–32)
Calcium: 8.5 mg/dL — ABNORMAL LOW (ref 8.9–10.3)
Chloride: 100 mmol/L (ref 98–111)
Creatinine, Ser: 0.56 mg/dL (ref 0.44–1.00)
GFR, Estimated: >60 mL/min (ref >60)
Glucose, Bld: 103 mg/dL — ABNORMAL HIGH (ref 70–99)
Potassium: 3.8 mmol/L (ref 3.5–5.1)
Sodium: 132 mmol/L — ABNORMAL LOW (ref 135–145)

## 2023-03-28 LAB — PHOSPHORUS: Phosphorus: 1.6 mg/dL — ABNORMAL LOW (ref 2.5–4.6)

## 2023-03-28 LAB — CBC
HCT: 38.5 % (ref 36.0–46.0)
Hemoglobin: 14.3 g/dL (ref 12.0–15.0)
MCH: 30.9 pg (ref 26.0–34.0)
MCHC: 37.1 g/dL — ABNORMAL HIGH (ref 30.0–36.0)
MCV: 83.2 fL (ref 80.0–100.0)
Platelets: 187 10*3/uL (ref 150–400)
RBC: 4.63 MIL/uL (ref 3.87–5.11)
RDW: 12.4 % (ref 11.5–15.5)
WBC: 6 10*3/uL (ref 4.0–10.5)
nRBC: 0 % (ref 0.0–0.2)

## 2023-03-28 LAB — GLUCOSE, CAPILLARY: Glucose-Capillary: 102 mg/dL — ABNORMAL HIGH (ref 70–99)

## 2023-03-28 MED ORDER — DIPHENHYDRAMINE HCL 50 MG/ML IJ SOLN
12.5000 mg | Freq: Once | INTRAMUSCULAR | Status: AC
  Administered 2023-03-28: 12.5 mg via INTRAVENOUS
  Filled 2023-03-28: qty 1

## 2023-03-28 MED ORDER — MIRTAZAPINE 15 MG PO TBDP
15.0000 mg | ORAL_TABLET | Freq: Every day | ORAL | Status: DC
  Administered 2023-03-31 – 2023-04-01 (×2): 15 mg via ORAL
  Filled 2023-03-28 (×7): qty 1

## 2023-03-28 MED ORDER — TRAZODONE HCL 50 MG PO TABS: 50.0000 mg | ORAL_TABLET | Freq: Every evening | ORAL | Status: DC | PRN

## 2023-03-28 MED ORDER — SODIUM PHOSPHATES 45 MMOLE/15ML IV SOLN
30.0000 mmol | Freq: Once | INTRAVENOUS | Status: AC
  Administered 2023-03-28: 30 mmol via INTRAVENOUS
  Filled 2023-03-28: qty 10

## 2023-03-28 NOTE — Assessment & Plan Note (Signed)
Resolved with repletion. No more testing as patient is now comfort care

## 2023-03-28 NOTE — Plan of Care (Signed)
  Problem: Clinical Measurements: Goal: Will remain free from infection Outcome: Progressing   Problem: Clinical Measurements: Goal: Diagnostic test results will improve Outcome: Progressing   Problem: Clinical Measurements: Goal: Cardiovascular complication will be avoided Outcome: Progressing   

## 2023-03-28 NOTE — Evaluation (Signed)
Clinical/Bedside Swallow Evaluation Patient Details  Name: Kimberly Cook MRN: [id number] Date of Birth: [birth year]  Today's Date: 03/28/2023 Time: SLP Start Time (ACUTE ONLY): 0920 SLP Stop Time (ACUTE ONLY): 1020 SLP Time Calculation (min) (ACUTE ONLY): 60 min  Past Medical History:  Past Medical History:  Diagnosis Date   Acute respiratory failure, unsp w hypoxia or hypercapnia (HCC)    Allergy    Anxiety    a.) on BZO (alprazolam) PRN   Aortic atherosclerosis (HCC)    Arthritis    Back pain    Chronic HFrEF (heart failure with reduced ejection fraction) (HCC)    a. 08/2021 echo: EF 30-35%, glob HK, GrI DD; b. 10/2021 Echo: EF 30-35%.   CKD (chronic kidney disease), stage II    Complication of anesthesia    Slow to clear meds after "14 hour back surgery"   Coronary artery disease    a. Mild to moderate CAD in LAD/diagonal by CTA (CT-FFR of apical LAD 0.79); b. 08/2021 Cath: LM nl, LAD min irregs, D1/2/3 nl, LCX nl, OM2/3 nl, RCA 20p, RPDA mild dzs, RPAV nl.   DDD (degenerative disc disease), lumbar    Depression    Diverticulosis    Essential hypertension    GERD (gastroesophageal reflux disease)    Headache    Hiatal hernia    History of shingles    HLD (hyperlipidemia)    Hypothyroidism    Lung nodule    Mini stroke 2011   Moderate Pericardial effusion    a. 08/2021 Echo: moderate circumferential pericardial effusion w/o tamponade; b. 10/2021 Echo: EF 30-35%, small to mod circumferential pericardial effusion w/o tamponade.   Muscle weakness    NICM (nonischemic cardiomyopathy) (HCC)    a. 08/2021 Echo: EF 30-35%, glob HK, GrI DD, nl RV fxn, mild-mod dil LA, mod circumferential pericardial eff w/o tamponade, Mod MR; b. 10/2021 Echo: EF 30-35%, glob HK.   Occipital neuralgia    On supplemental oxygen by nasal cannula    a.) 2L.[place] at bedtime and PRN   Palpitations    Pancytopenia (HCC)    Pleural effusion on left    a. 08/2021 s/p thoracentesis.   Pneumonia 2018    Polyneuropathy    Prediabetes    Renal cyst, left    a.) Renal artery [place] 12/26/2017: measured 2.1 cm.   Sleep difficulties    a.) takes trazodone + melatonin   Stroke (HCC)    MRI 04/2008 + left sup. frontal gyrus possibly puntate infarct    Syncope 2019   T7 vertebral fracture (HCC) 10/22/2022   Uses wheelchair    able to stand and self transfer   Vitamin B deficiency    Vitamin D deficiency    Past Surgical History:  Past Surgical History:  Procedure Laterality Date   ABDOMINAL HYSTERECTOMY     BLADDER SURGERY     2003   BREAST EXCISIONAL BIOPSY Right Over 20 years    Benign   CATARACT EXTRACTION W/PHACO Right 10/09/2022   Procedure: CATARACT EXTRACTION PHACO AND INTRAOCULAR LENS PLACEMENT (IOC) RIGHT  8.13  00:43.5;  Surgeon: Lockie Mola, MD;  Location: [facility];  Service: Ophthalmology;  Laterality: Right;   CATARACT EXTRACTION W/PHACO Left 11/13/2022   Procedure: CATARACT EXTRACTION PHACO AND INTRAOCULAR LENS PLACEMENT (IOC) LEFT;  Surgeon: Lockie Mola, MD;  Location: [facility];  Service: Ophthalmology;  Laterality: Left;  11.47 0:51.7   CHOLECYSTECTOMY     CYSTOSCOPY W/ URETERAL STENT PLACEMENT Left 05/13/2022  Procedure: CYSTOSCOPY WITH RETROGRADE PYELOGRAM/URETERAL LEFT STENT PLACEMENT;  Surgeon: Vanna Scotland, MD;  Location: [facility];  Service: Urology;  Laterality: Left;   CYSTOSCOPY WITH BIOPSY N/A 05/13/2022   Procedure: CYSTOSCOPY WITH BLADDER BIOPSY;  Surgeon: Vanna Scotland, MD;  Location: [facility];  Service: Urology;  Laterality: N/A;   gastroplication      KNEE ARTHROSCOPY Left 2011   PULSE GENERATOR IMPLANT Left 01/31/2020   Procedure: PLACEMENT RIGHT FLANK PULSE GENERATOR VS REMOVAL SPINAL CORD STIMULATOR;  Surgeon: Lucy Chris, MD;  Location: [facility];  Service: Neurosurgery;  Laterality: Left;   PULSE GENERATOR IMPLANT Left 04/24/2020   Procedure: REPLACEMENT LEFT FLANK PULSE GENERATOR IMPLANT;  Surgeon: Lucy Chris,  MD;  Location: [facility];  Service: Neurosurgery;  Laterality: Left;  MAC w/ local   REPLACEMENT TOTAL KNEE Left    right arm fracture     RIGHT/LEFT HEART CATH AND CORONARY ANGIOGRAPHY N/A 08/28/2021   Procedure: RIGHT/LEFT HEART CATH AND CORONARY ANGIOGRAPHY;  Surgeon: Iran Ouch, MD;  Location: [facility];  Service: Cardiovascular;  Laterality: N/A;   SPINAL CORD STIMULATOR REMOVAL N/A 06/26/2020   Procedure: SPINAL CORD STIMULATOR REMOVAL;  Surgeon: Lucy Chris, MD;  Location: [facility];  Service: Neurosurgery;  Laterality: N/A;   THORACIC LAMINECTOMY FOR SPINAL CORD STIMULATOR N/A 01/24/2020   Procedure: THORACIC SPINAL CORD STIMULATOR PADDLE TRIAL VIA LAMINECTOMY;  Surgeon: Lucy Chris, MD;  Location: [facility];  Service: Neurosurgery;  Laterality: N/A;   TONSILLECTOMY AND ADENOIDECTOMY     TOTAL KNEE ARTHROPLASTY Left 04/17/2015   Procedure: LEFT TOTAL KNEE ARTHROPLASTY;  Surgeon: Durene Romans, MD;  Location: [facility];  Service: Orthopedics;  Laterality: Left;   HPI:  Pt is a 77 year old female with history of advanced Dementia, recent COVID-19 infection, hyperlipidemia, hypertension, neuropathy, insomnia, depression, anxiety, who presents to the emergency department for chief concerns of altered mental status, failure to thrive.  Patient is from [facility].  Per Son and chart reports, "pt has been refusing all p.o. intake and pocketing food and medication and then spitting it out.  Per son this is being going on for couple of weeks".  Per chart, "poor p.o. intake and failure to thrive picture at her facility. Palliative care was also consulted".  Head CT: no acute abnormalities.  CXR: No acute cardiopulmonary disease.    Assessment / Plan / Recommendation  Clinical Impression  Pt seen for BSE today. Pt awake, nodded and smiled but appeared nervous as she was positioned upright in bed for po's. Pt answered a few basic questions. Baseline of Dementia, and Anxiety.  On RA,  afebrile. WBC WNL.  Pt appears to present w/ oropharyngeal phase dysphagia in setting of declined Cognitive status; Baseline Dementia and decreased desire for oral intake. Unsure if impact from Acute illness w/ the Cognitive decline and/or the Baseline Esophageal phase Dysmotility from a MOD size Hiatal Hernia per Imaging in 2020. ANY Cognitive decline and/or Esophageal phase Dysmotility can impact overall awareness/timing of swallow, Esophageal clearing/REFLUX behaviors, and safety during po tasks which increases risk for aspiration, choking thus aspiration pneumonia.    Pt's oral phase presentation and lack of desire for oral intake/stimulation in setting of Cognitive decline increases risk for aspiration/aspiration pneumonia at this time.         Pt presented w/ several trials of ice chips, purees, and thin liquids via tsp w/ an immediate negative reaction to the bolus material in her mouth c/b oral holding, gagging, then expectoration of the  boluses. Pharyngeal swallowing post the expectoration was noted (w/ residue from boluses?), but pt clearly did not exhibit A-P transfer and swallow of the boluses d/t the gagging behaviors and expectoration. Unable to fully assess oropharyngeal swallowing in setting of the negative reaction/behaviors to the boluses given orally; then to her lack of desire to try anything more despite encouragement. NSG and MD updated.     W/ the OM/phase acceptance of the boluses, no overt oral weakness noted; ROM and strength of tongue/lips appeared [facility] w/ trials attempted and w/ removal of Dentures for cleaning(pt often closed lips tightly). No unilateral weakness noted.    MUCH confusion of OM tasks and oral care when attempted, then refused oral care by closing mouth and turning head. She did not attempt to feed self. She clearly did not want po boluses.              In setting of presentation w/ attempted po trials and baseline Dementia and Cognitive decline, recommend NPO  status w/ therapeutic Single ice chips trials post oral care w/ NSG supervision following aspiration precautions to encourage oropharyngeal swallowing. Feeding support and encouragement. ST services will continue w/ ongoing assessment of swallowing in hopes to upgrade to least restrictive diet safely. Frequent oral care for hygiene and stimulation of swallowing. MD/NSG updated, agreed.  ST services recommends follow w/ Palliative Care for GOC and education re: impact of Cognitive decline/Dementia on swallowing. Precautions posted in room. SLP Visit Diagnosis: Dysphagia, oropharyngeal phase (R13.12) (in setting of Baseline Dementia; FTT)    Aspiration Risk  Mild aspiration risk;Moderate aspiration risk;Risk for inadequate nutrition/hydration    Diet Recommendation   NPO (therapeutic single ice chips w/ NSG) = herapeutic Single ice chips trials post oral care w/ NSG supervision following aspiration precautions to encourage oropharyngeal swallowing. Feeding support and encouragement.  Medication Administration: Via alternative means    Other  Recommendations Recommended Consults:  (Palliative Care; Dietician) Oral Care Recommendations: Oral care QID;Oral care prior to ice chip/H20;Staff/trained caregiver to provide oral care (denture care) Caregiver Recommendations:  (TBD)    Recommendations for follow up therapy are one component of a multi-disciplinary discharge planning process, led by the attending physician.  Recommendations may be updated based on patient status, additional functional criteria and insurance authorization.  Follow up Recommendations Follow physician's recommendations for discharge plan and follow up therapies      Assistance Recommended at Discharge  FULL  Functional Status Assessment Patient has had a recent decline in their functional status and/or demonstrates limited ability to make significant improvements in function in a reasonable and predictable amount of time   Frequency and Duration min 2x/week  2 weeks       Prognosis Prognosis for improved oropharyngeal function: Guarded Barriers to Reach Goals: Cognitive deficits;Language deficits;Time post onset;Severity of deficits;Behavior Barriers/Prognosis Comment: Baseline Dementia; FTT; acute illness      Swallow Study   General Date of Onset: 03/26/23 HPI: Pt is a 77 year old female with history of advanced Dementia, recent COVID-19 infection, hyperlipidemia, hypertension, neuropathy, insomnia, depression, anxiety, who presents to the emergency department for chief concerns of altered mental status, failure to thrive.  Patient is from [facility].  Per Son and chart reports, "pt has been refusing all p.o. intake and pocketing food and medication and then spitting it out.  Per son this is being going on for couple of weeks".  Per chart, "poor p.o. intake and failure to thrive picture at her facility. Palliative care was also consulted".  Head CT: no acute abnormalities.  CXR: No acute cardiopulmonary disease. Type of Study: Bedside Swallow Evaluation Previous Swallow Assessment: 08/2021 - regular diet then Diet Prior to this Study: NPO Temperature Spikes Noted: No (wbc 6.0) Respiratory Status: Room air History of Recent Intubation: No Behavior/Cognition: Alert;Cooperative;Pleasant mood;Confused;Distractible;Requires cueing;Doesn't follow directions (appeared anxious) Oral Cavity Assessment: Dry (sticky) Oral Care Completed by SLP: Yes (attempted - pt closed mouth) Oral Cavity - Dentition: Dentures, top;Dentures, bottom (removed to clean) Vision:  (n/a) Self-Feeding Abilities: Total assist Patient Positioning: Upright in bed (full assist) Baseline Vocal Quality: Low vocal intensity (mumbled speech x2) Volitional Cough: Cognitively unable to elicit Volitional Swallow: Unable to elicit    Oral/Motor/Sensory Function Overall Oral Motor/Sensory Function: Within functional limits (no unlateral  weakness noted)   Ice Chips Ice chips: Impaired Presentation: Spoon (fed; 2 trials) Oral Phase Impairments: Poor awareness of bolus Oral Phase Functional Implications: Oral holding Pharyngeal Phase Impairments:  (gagging and expectoration)   Thin Liquid Thin Liquid: Impaired Presentation: Cup (fed; 2 trials) Oral Phase Impairments: Poor awareness of bolus Oral Phase Functional Implications: Oral holding Pharyngeal  Phase Impairments:  (gagging and expectoration)    Nectar Thick Nectar Thick Liquid: Not tested   Honey Thick Honey Thick Liquid: Not tested   Puree Puree: Impaired Presentation: Spoon (fed; 2 trials) Oral Phase Impairments: Poor awareness of bolus Oral Phase Functional Implications: Oral holding Pharyngeal Phase Impairments:  (gagging and expectoration)   Solid     Solid: Not tested       Jerilynn Som, MS, CCC-SLP Speech Language Pathologist Rehab Services; [facility] [phone] (ascom) Mayci Haning 03/28/2023,5:56 PM

## 2023-03-28 NOTE — Consult Note (Signed)
Consultation Note Date: 03/28/2023 at 0945  Patient Name: Kimberly Cook  DOB: 02-Aug-1946  MRN: [id number]  Age / Sex: 77 y.o., female  PCP: [facility] Referring Physician: Arnetha Courser, MD  Reason for Consultation: Establishing goals of care  HPI/Patient Profile: 77 y.o. female  with past medical history of dementia, HLD, HTN, neuropathy, insomnia, depression, and anxiety admitted from LTC part of [facility] on 03/26/2023 with AMS.  Patient found to be COVID-positive and failure to thrive.  CT of head was negative.  Patient being treated with electrolyte replacement and IVF.  PMT was consulted to discuss goals of care.  Of note, patient is familiar to our service as I saw patient during her September 2023 hospitalization..   Clinical Assessment and Goals of Care: I have reviewed medical records including EPIC notes, labs and imaging, assessed the patient and then met with patient, her son [name] in person, and her daughter-in-law [name] over the phone to discuss diagnosis prognosis, GOC, EOL wishes, disposition and options.  I introduced Palliative Medicine as specialized medical care for people living with serious illness. It focuses on providing relief from the symptoms and stress of a serious illness. The goal is to improve quality of life for both the patient and the family.  We discussed a brief life review of the patient.  Patient is a widow and mother of 2.  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  She is a resident in the long-term care part of [facility].  As far as functional and nutritional status family endorses a significant decline over the past several weeks.  Son endorses he saw her a week ago and she was able to make conversation but was not eating or drinking as much as he knew  her to previously.  Patient has been bedbound for over a year.   We discussed patient's current illness and what it means in the larger context of patient's on-going co-morbidities.  Discussed that dementia is a chronic, progressive, and irreversible disease that is often exacerbated and accelerated by acute illnesses and hospitalizations.  Family endorses they have seen a significant decline in patient in the past several weeks in regards to her mental status.  We discussed multiple factors contributing to patient's AMS which include electrolyte imbalance, dehydration, COVID, and dementia.  Natural disease trajectory discussed.  I attempted to elicit values and goals of care important to the patient.    Family asks if patient does not eat and drinks and how long does she have to live.  Discussed that patient can be maintained on IV fluids during hospitalization which will sustain her but that patients who are not supported with artificial hydration and have no p.o. intake have weeks to live.    Family then asked about hospice.  Hospice philosophy, hospice in the home, and hospice inpatient unit discussed.  The difference between aggressive medical intervention and comfort care was  considered in light of the patient's goals of care.   Advance directives, concepts specific to code status, artificial feeding and hydration, and rehospitalization were considered and discussed.  I have reviewed patient's MOST form and advanced directive on file in ACP tab of epic.  Discussed DNR with limited interventions.  Family confirmed and support patient's wishes to remain a DNR, have no life prolonging measures, and would not be accepting of artificial nutrition hydration to sustain her life.  Education offered regarding concept specific to human mortality and the limitations of medical interventions to prolong life when the body begins to fail to thrive.  Family was appreciative of our discussion and would like  time to see how patient responds to hydration and electrolyte replacement over the weekend. Family would like to wait through the weekend and reevaluate next steps for patient on Monday.    Time for outcomes needed. Watchful waiting.   PMT will continue to follow and support patient throughout her hospitalization.  Primary Decision Maker HCPOA  Physical Exam Vitals reviewed.  HENT:     Head: Normocephalic.     Mouth/Throat:     Mouth: Mucous membranes are moist.  Eyes:     Pupils: Pupils are equal, round, and reactive to light.  Pulmonary:     Effort: Pulmonary effort is normal.  Abdominal:     Palpations: Abdomen is soft.  Musculoskeletal:     Comments: Generalized weakness  Skin:    General: Skin is warm and dry.     Coloration: Skin is pale.  Neurological:     Mental Status: She is alert.  Psychiatric:        Judgment: Judgment normal.     Palliative Assessment/Data: 10%     Thank you for this consult. Palliative medicine will continue to follow and assist holistically.   Time Total: 75 minutes  Signed by: Georgiann Cocker, DNP, FNP-BC Palliative Medicine    Please contact Palliative Medicine Team phone at [phone] for questions and concerns.  For individual provider: See Loretha Stapler

## 2023-03-28 NOTE — Progress Notes (Signed)
Progress Note   Patient: Kimberly Cook EUM:[id number] DOB: 1946/05/07 DOA: 03/26/2023     1 DOS: the patient was seen and examined on 03/28/2023   Brief hospital course: [name] is a 77 year old female with history of dementia, recent COVID-19 infection, hyperlipidemia, hypertension, neuropathy, insomnia, depression, anxiety, who presents to the emergency department for chief concerns of altered mental status, failure to thrive.  Patient is from [facility].  Vitals in the ED showed temperature of 98.8, respiration rate of 17, heart rate of 100, blood pressure 160/111, SpO2 of 100% on room air.  Serum sodium is 130, potassium 3.3, chloride 99, bicarb 14, BUN of 24, serum creatinine 0.70, eGFR greater than 60, nonfasting blood glucose 127, WBC 7.1, hemoglobin 15.5, platelets of 231.  High sensitivity troponin was 13.  TSH was 2.044.  ED treatment: LR 2 L bolus x 2.  8/22: Vital stable.  Likely secondary to recent COVID-19 infection with poor p.o. intake. Labs with improving hyponatremia with sodium at 132, potassium 3, CO2 19.  Procalcitonin 0.11.  Replating more potassium and magnesium as magnesium was borderline yesterday.  Not participating with active care, refusing mostly p.o. intake. Swallow evaluation was also ordered.  Per son and DIL, patient was recently taken away her financial privilege due to advanced dementia, since then she appears mostly depressed, poor p.o. intake and failure to thrive picture at her facility.  Palliative care was also consulted.  8/23: Mildly elevated blood pressure at 142/105.  Refusing all p.o. intake and pocketing food and medication and then spitting it out.  Per son this is being going on for couple of weeks. Pending palliative care consult.  Assessment and Plan: * Altered mental status I suspect this is multifactorial in setting of recent COVID-19 infection along with poor appetite leading to clinical dehydration.  History of  advanced dementia and worsening depression by family.  Patient was not eating well over the past couple of weeks at facility.  Concern of failure to thrive. CT head without any acute abnormality. Treat symptomatically Patient received IV fluid in ED. -Swallow evaluation pending as patient is keeping everything in her mouth -Continue IV fluid -Palliative care consult  Generalized weakness Patient with advanced dementia, oriented to name only. Concern of failure to thrive.  Recent COVID infection is contributory. -PT/OT evaluation once able to participate  Dehydration With hemoconcentration, elevated BUN and metabolic acidosis Mucosa dry on presentation Status post LR 2 L bolus per EDP Sodium chloride infusion at 100 mL/h, 1 day ordered Recheck BMP in a.m.  Essential hypertension Not controlled on admission Home losartan 12.5 mg daily resumed  Hypokalemia Hypokalemia resolved with IV replacement.  Found to have hypophosphatemia today which is being repleted.  Hypothyroidism Home levothyroxine 100 mcg daily before breakfast resumed  Dementia without behavioral disturbance (HCC) Diagnosed approximately 1 month ago Oriented to name only   Anxiety and depression Home Celexa 20 mg daily, trazodone 100 mg nightly for resumed Alprazolam 0.5 mg p.o. every 6 hours as needed for anxiety resumed  Hyperlipidemia -Continue home statin  GERD (gastroesophageal reflux disease) -Continue PPI  Coronary artery disease involving native coronary artery of native heart without angina pectoris -Continue home aspirin and statin   Subjective: Patient was more awake and alert, stating no to pain or any food.  Son at bedside  Physical Exam: Vitals:   03/27/23 2230 03/28/23 0229 03/28/23 0500 03/28/23 0833  BP:  (!) 155/90  (!) 142/105  Pulse:  96  93  Resp: 20 20  19   Temp:  98.2 F (36.8 C)  97.7 F (36.5 C)  TempSrc:    Axillary  SpO2: 100% 92%  100%  Weight:   74.6 kg   Height:        General.  Chronically ill-appearing, frail elderly lady, in no acute distress. Pulmonary.  Lungs clear bilaterally, normal respiratory effort. CV.  Regular rate and rhythm, no JVD, rub or murmur. Abdomen.  Soft, nontender, nondistended, BS positive. CNS.  Alert and oriented .  No focal neurologic deficit. Extremities.  No edema, no cyanosis, pulses intact and symmetrical. Psychiatry.  Judgment and insight appears impaired.    Data Reviewed: Prior data reviewed  Family Communication: Discussed with son at bedside  Disposition: Status is: Inpatient Remains inpatient appropriate because: Severity of illness  Planned Discharge Destination: Skilled nursing facility  DVT prophylaxis.  Lovenox Time spent: 45 minutes  This record has been created using Conservation officer, historic buildings. Errors have been sought and corrected,but may not always be located. Such creation errors do not reflect on the standard of care.   Author: Arnetha Courser, MD 03/28/2023 3:12 PM  For on call review [email or web address].

## 2023-03-28 NOTE — Plan of Care (Signed)
  Problem: Clinical Measurements: Goal: Respiratory complications will improve Outcome: Progressing   Problem: Coping: Goal: Level of anxiety will decrease Outcome: Progressing   Problem: Elimination: Goal: Will not experience complications related to bowel motility Outcome: Progressing   Problem: Elimination: Goal: Will not experience complications related to urinary retention Outcome: Progressing   Problem: Pain Managment: Goal: General experience of comfort will improve Outcome: Progressing

## 2023-03-29 DIAGNOSIS — E860 Dehydration: Secondary | ICD-10-CM | POA: Diagnosis not present

## 2023-03-29 DIAGNOSIS — R531 Weakness: Secondary | ICD-10-CM | POA: Diagnosis not present

## 2023-03-29 DIAGNOSIS — I10 Essential (primary) hypertension: Secondary | ICD-10-CM | POA: Diagnosis not present

## 2023-03-29 DIAGNOSIS — R410 Disorientation, unspecified: Secondary | ICD-10-CM | POA: Diagnosis not present

## 2023-03-29 MED ORDER — LABETALOL HCL 5 MG/ML IV SOLN
5.0000 mg | INTRAVENOUS | Status: DC | PRN
  Administered 2023-03-29 – 2023-03-31 (×3): 5 mg via INTRAVENOUS
  Filled 2023-03-29 (×2): qty 4

## 2023-03-29 MED ORDER — LACTATED RINGERS IV SOLN: INTRAVENOUS | Status: AC

## 2023-03-29 NOTE — Plan of Care (Signed)

## 2023-03-29 NOTE — Assessment & Plan Note (Signed)
Failure to thrive. Patient with advanced dementia, oriented to name only. Concern of failure to thrive.  Recent COVID infection is contributory. Refusing all p.o. intake and pocketing food, being going on for couple of weeks. Palliative care was consulted-family would like to see over the weekend how she does before proceeding for full comfort care and hospice -Family requested PEG tube placement today-IR was consulted

## 2023-03-29 NOTE — Progress Notes (Signed)
Progress Note   Patient: Kimberly Cook DOB: 1946/07/22 DOA: 03/26/2023     2 DOS: the patient was seen and examined on 03/29/2023   Brief hospital course: [name] is a 77 year old female with history of dementia, recent COVID-19 infection, hyperlipidemia, hypertension, neuropathy, insomnia, depression, anxiety, who presents to the emergency department for chief concerns of altered mental status, failure to thrive.  Patient is from [facility]  Vitals in the ED showed temperature of 98.8, respiration rate of 17, heart rate of 100, blood pressure 160/111, SpO2 of 100% on room air.  Serum sodium is 130, potassium 3.3, chloride 99, bicarb 14, BUN of 24, serum creatinine 0.70, eGFR greater than 60, nonfasting blood glucose 127, WBC 7.1, hemoglobin 15.5, platelets of 231.  High sensitivity troponin was 13.  TSH was 2.044.  ED treatment: LR 2 L bolus x 2.  8/22: Vital stable.  Likely secondary to recent COVID-19 infection with poor p.o. intake. Labs with improving hyponatremia with sodium at 132, potassium 3, CO2 19.  Procalcitonin 0.11.  Replating more potassium and magnesium as magnesium was borderline yesterday.  Not participating with active care, refusing mostly p.o. intake. Swallow evaluation was also ordered.  Per son and DIL, patient was recently taken away her financial privilege due to advanced dementia, since then she appears mostly depressed, poor p.o. intake and failure to thrive picture at her facility.  Palliative care was also consulted.  8/23: Mildly elevated blood pressure at 142/105.  Refusing all p.o. intake and pocketing food and medication and then spitting it out.  Per son this is being going on for couple of weeks. Pending palliative care consult.  8/24: Patient continued to refuse p.o. intake, pocketing food, family would like to see her response over the weekend before decided about full comfort care.  Assessment and Plan: * Altered  mental status I suspect this is multifactorial in setting of recent COVID-19 infection along with poor appetite leading to clinical dehydration.  History of advanced dementia and worsening depression by family.  Patient was not eating well over the past couple of weeks at facility.  Concern of failure to thrive. CT head without any acute abnormality. Treat symptomatically Patient received IV fluid in ED. -Swallow evaluation pending as patient is keeping everything in her mouth -Continue IV fluid -Palliative care consult  Generalized weakness Failure to thrive. Patient with advanced dementia, oriented to name only. Concern of failure to thrive.  Recent COVID infection is contributory. Refusing all p.o. intake and pocketing food, being going on for couple of weeks. Palliative care was consulted-family would like to see over the weekend how she does before proceeding for full comfort care and hospice  Dehydration With hemoconcentration, elevated BUN and metabolic acidosis Mucosa dry on presentation Status post LR 2 L bolus per EDP Sodium chloride infusion at 100 mL/h, 1 day ordered Recheck BMP in a.m.  Essential hypertension Not controlled on admission Home losartan 12.5 mg daily resumed  Hypokalemia Hypokalemia resolved with IV replacement.  Found to have hypophosphatemia today which is being repleted.  Hypothyroidism Home levothyroxine 100 mcg daily before breakfast resumed  Dementia without behavioral disturbance (HCC) Diagnosed approximately 1 month ago Oriented to name only   Anxiety and depression Home Celexa 20 mg daily, trazodone 100 mg nightly for resumed Alprazolam 0.5 mg p.o. every 6 hours as needed for anxiety resumed  Hyperlipidemia -Continue home statin  GERD (gastroesophageal reflux disease) -Continue PPI  Coronary artery disease involving native coronary artery  of native heart without angina pectoris -Continue home aspirin and statin   Subjective:  Patient remains lethargic, worsening weakness and still refusing all p.o. intake  Physical Exam: Vitals:   03/29/23 0805 03/29/23 0958 03/29/23 1001 03/29/23 1016  BP: (!) 186/128 (!) 165/128    Pulse: (!) 118 (!) 118 96   Resp: 20 20    Temp: 97.8 F (36.6 C) 98.8 F (37.1 C)    TempSrc: Oral Oral    SpO2: 100% 98% (!) 83% 92%  Weight:      Height:       General.  Lethargic elderly lady, in no acute distress. Pulmonary.  Lungs clear bilaterally, normal respiratory effort. CV.  Regular rate and rhythm, no JVD, rub or murmur. Abdomen.  Soft, nontender, nondistended, BS positive. CNS.  Awake but lethargic.  No focal neurologic deficit. Extremities.  No edema, no cyanosis, pulses intact and symmetrical. Psychiatry.  Judgment and insight appears impaired  Data Reviewed: Prior data reviewed  Family Communication: Discussed with son at bedside  Disposition: Status is: Inpatient Remains inpatient appropriate because: Severity of illness  Planned Discharge Destination: Skilled nursing facility  DVT prophylaxis.  Lovenox Time spent: 42 minutes  This record has been created using Conservation officer, historic buildings. Errors have been sought and corrected,but may not always be located. Such creation errors do not reflect on the standard of care.   Author: Arnetha Courser, MD 03/29/2023 2:08 PM  For on call review [email or web address].

## 2023-03-29 NOTE — Progress Notes (Signed)
Speech Language Pathology Treatment: Dysphagia  Patient Details Name: Kimberly Cook MRN: [id number] DOB: April 16, 1946 Today's Date: 03/29/2023 Time: 4098-1191 SLP Time Calculation (min) (ACUTE ONLY): 10 min  Assessment / Plan / Recommendation Clinical Impression  Pt was awake and upright. When presented with spoon on ice chips as well as straw, pt unable to open her mouth to receive. Suspect this is likely related to pt's cognitive status. This information provided via secure chat to pt's attending and nurse. ST to continue following for any improvement.    HPI HPI: Pt is a 77 year old female with history of advanced Dementia, recent COVID-19 infection, hyperlipidemia, hypertension, neuropathy, insomnia, depression, anxiety, who presents to the emergency department for chief concerns of altered mental status, failure to thrive.  Patient is from [facility].  Per Son and chart reports, "pt has been refusing all p.o. intake and pocketing food and medication and then spitting it out.  Per son this is being going on for couple of weeks".  Per chart, "poor p.o. intake and failure to thrive picture at her facility. Palliative care was also consulted".  Head CT: no acute abnormalities.  CXR: No acute cardiopulmonary disease.      SLP Plan  Continue with current plan of care      Recommendations for follow up therapy are one component of a multi-disciplinary discharge planning process, led by the attending physician.  Recommendations may be updated based on patient status, additional functional criteria and insurance authorization.    Recommendations  Diet recommendations: NPO (ice chips) Medication Administration: Via alternative means Compensations: Minimize environmental distractions;Slow rate;Small sips/bites Postural Changes and/or Swallow Maneuvers: Seated upright 90 degrees;Out of bed for meals                  Oral care QID;Oral care prior to ice chip/H20;Staff/trained  caregiver to provide oral care           Continue with current plan of care    Aleenah Homen B. Dreama Saa, M.S., CCC-SLP, Tree surgeon Certified Brain Injury Specialist [facility]  [facility] Office [phone] Ascom [phone] Fax [phone]

## 2023-03-30 DIAGNOSIS — R410 Disorientation, unspecified: Secondary | ICD-10-CM | POA: Diagnosis not present

## 2023-03-30 DIAGNOSIS — R531 Weakness: Secondary | ICD-10-CM | POA: Diagnosis not present

## 2023-03-30 DIAGNOSIS — I10 Essential (primary) hypertension: Secondary | ICD-10-CM | POA: Diagnosis not present

## 2023-03-30 DIAGNOSIS — E860 Dehydration: Secondary | ICD-10-CM | POA: Diagnosis not present

## 2023-03-30 MED ORDER — LACTATED RINGERS IV SOLN: INTRAVENOUS | Status: DC

## 2023-03-30 NOTE — Progress Notes (Signed)
Progress Note   Patient: Kimberly Cook UEA:[id number] DOB: September 01, 1945 DOA: 03/26/2023     3 DOS: the patient was seen and examined on 03/30/2023   Brief hospital course: Ms. Dulcemaria Fan is a 77 year old female with history of dementia, recent COVID-19 infection, hyperlipidemia, hypertension, neuropathy, insomnia, depression, anxiety, who presents to the emergency department for chief concerns of altered mental status, failure to thrive.  Patient is from [facility].  Vitals in the ED showed temperature of 98.8, respiration rate of 17, heart rate of 100, blood pressure 160/111, SpO2 of 100% on room air.  Serum sodium is 130, potassium 3.3, chloride 99, bicarb 14, BUN of 24, serum creatinine 0.70, eGFR greater than 60, nonfasting blood glucose 127, WBC 7.1, hemoglobin 15.5, platelets of 231.  High sensitivity troponin was 13.  TSH was 2.044.  ED treatment: LR 2 L bolus x 2.  8/22: Vital stable.  Likely secondary to recent COVID-19 infection with poor p.o. intake. Labs with improving hyponatremia with sodium at 132, potassium 3, CO2 19.  Procalcitonin 0.11.  Replating more potassium and magnesium as magnesium was borderline yesterday.  Not participating with active care, refusing mostly p.o. intake. Swallow evaluation was also ordered.  Per son and DIL, patient was recently taken away her financial privilege due to advanced dementia, since then she appears mostly depressed, poor p.o. intake and failure to thrive picture at her facility.  Palliative care was also consulted.  8/23: Mildly elevated blood pressure at 142/105.  Refusing all p.o. intake and pocketing food and medication and then spitting it out.  Per son this is being going on for couple of weeks. Pending palliative care consult.  8/24: Patient continued to refuse p.o. intake, pocketing food, family would like to see her response over the weekend before decided about full comfort care.  8/25: Patient continued to  refuse p.o. intake but little more interactive with family today, family is not requesting trial of PEG tube for feeding.  IR was consulted, patient has not taken aspirin since in the hospital as refusing all p.o. intake, it was discontinued from med orders, as requested by IR  Assessment and Plan: * Altered mental status I suspect this is multifactorial in setting of recent COVID-19 infection along with poor appetite leading to clinical dehydration.  History of advanced dementia and worsening depression by family.  Patient was not eating well over the past couple of weeks at facility.  Concern of failure to thrive. CT head without any acute abnormality. Treat symptomatically -Formal swallow evaluation still pending as patient keep pocketing and refused to take all p.o. intake. -Family now requesting PEG tube placement for feeding -Continue IV fluid -Palliative care consult  Generalized weakness Failure to thrive. Patient with advanced dementia, oriented to name only. Concern of failure to thrive.  Recent COVID infection is contributory. Refusing all p.o. intake and pocketing food, being going on for couple of weeks. Palliative care was consulted-family would like to see over the weekend how she does before proceeding for full comfort care and hospice -Family requested PEG tube placement today-IR was consulted  Dehydration With hemoconcentration, elevated BUN and metabolic acidosis Mucosa dry on presentation Status post LR 2 L bolus per EDP Sodium chloride infusion at 100 mL/h, 1 day ordered Recheck BMP in a.m.  Essential hypertension Not controlled on admission Home losartan 12.5 mg daily resumed  Hypokalemia Hypokalemia resolved with IV replacement.  Found to have hypophosphatemia today which is being repleted.  Hypothyroidism Home levothyroxine 100  mcg daily before breakfast resumed  Dementia without behavioral disturbance (HCC) Diagnosed approximately 1 month ago Oriented  to name only   Anxiety and depression Home Celexa 20 mg daily, trazodone 100 mg nightly for resumed Alprazolam 0.5 mg p.o. every 6 hours as needed for anxiety resumed  Hyperlipidemia -Continue home statin  GERD (gastroesophageal reflux disease) -Continue PPI  Coronary artery disease involving native coronary artery of native heart without angina pectoris -Continue home aspirin and statin   Subjective: Patient was awake but still denying all p.o. intake.  Physical Exam: Vitals:   03/29/23 1810 03/29/23 2032 03/30/23 0537 03/30/23 1000  BP: (!) 154/118 (!) 152/109 (!) 154/110   Pulse: (!) 102 (!) 107 (!) 103   Resp: 18 16 18    Temp: 98.4 F (36.9 C) 98.1 F (36.7 C) 98.4 F (36.9 C)   TempSrc: Oral Oral Oral   SpO2: 100% 100% 100% 100%  Weight:      Height:       General.  Frail elderly lady, in no acute distress. Pulmonary.  Lungs clear bilaterally, normal respiratory effort. CV.  Regular rate and rhythm, no JVD, rub or murmur. Abdomen.  Soft, nontender, nondistended, BS positive. CNS.  Alert and oriented to name only.  No focal neurologic deficit. Extremities.  No edema, no cyanosis, pulses intact and symmetrical. Psychiatry.  Judgment and insight appears impaired  Data Reviewed: Prior data reviewed  Family Communication: Discussed with son at bedside  Disposition: Status is: Inpatient Remains inpatient appropriate because: Severity of illness  Planned Discharge Destination: Skilled nursing facility  DVT prophylaxis.  Lovenox Time spent: 40 minutes  This record has been created using Conservation officer, historic buildings. Errors have been sought and corrected,but may not always be located. Such creation errors do not reflect on the standard of care.   Author: Arnetha Courser, MD 03/30/2023 1:14 PM  For on call review [email or web address].

## 2023-03-30 NOTE — Plan of Care (Addendum)
IR was requested for image guided G tube placement.   Pt on ASA 81 mg at home, per chart it was given sometime in the third week of August, has not given during this ED visit/admission since 8/21.    Ordering provider asked to d/c ASA order as it requires 5 day hold for G tube placement.    CT A/P with from 03/26/23 will be reviewed with IR radiologist on Monday, if anatomy amenable will discuss with patient and family and schedule the procedure.   Please call IR for questions and concerns.   Willette Brace PA-C 03/30/2023 12:57 PM

## 2023-03-30 NOTE — Assessment & Plan Note (Signed)
Waxing and waning. I suspect this is multifactorial in setting of recent COVID-19 infection along with poor appetite leading to clinical dehydration.  History of advanced dementia and worsening depression by family.  Patient was not eating well over the past couple of weeks at facility.  Likely decline with advanced dementia and failure to thrive. CT head without any acute abnormality. Continued to decline with increased lethargy.  Refusing all p.o. intake. Family now agreed to proceed with comfort care and would like to explore option for inpatient hospice. -Referral was provided by TOC-awaiting evaluation

## 2023-03-30 NOTE — Progress Notes (Signed)
Speech Language Pathology Treatment: Dysphagia  Patient Details Name: Kimberly Cook MRN: [id number] DOB: 07/27/1946 Today's Date: 03/30/2023 Time: 2130-8657 SLP Time Calculation (min) (ACUTE ONLY): 8 min  Assessment / Plan / Recommendation Clinical Impression  Pt awake and pleasant. Attempted further clinical assessment of pt's swallowing. On 1L/min O2 via [place]. Oral provided via foam toothette prior to PO trials. Xerostomia appreciated. Pt declined donning upper and lower denture. Pt continues with limited PO acceptance as re-evaluation limited to x1 ice chip, x1 tsp of H2O, and x1 tsp of nectar-thick liquids. Despite encouragement, pt refused puree or additional trials of previously administered consistencies. In the interim, recommend NPO x single ice chips in moderation for comfort. 1:1 assistance and supervision with ice chips. Oral care prior to ice chips. ST to continue to f/u for any improvement.    HPI HPI: Pt is a 77 year old female with history of advanced Dementia, recent COVID-19 infection, hyperlipidemia, hypertension, neuropathy, insomnia, depression, anxiety, who presents to the emergency department for chief concerns of altered mental status, failure to thrive.  Patient is from [facility].  Per Son and chart reports, "pt has been refusing all p.o. intake and pocketing food and medication and then spitting it out.  Per son this is being going on for couple of weeks".  Per chart, "poor p.o. intake and failure to thrive picture at her facility. Palliative care was also consulted".  Head CT: no acute abnormalities.  CXR: No acute cardiopulmonary disease.      SLP Plan  Continue with current plan of care      Recommendations for follow up therapy are one component of a multi-disciplinary discharge planning process, led by the attending physician.  Recommendations may be updated based on patient status, additional functional criteria and insurance authorization.     Recommendations  Diet recommendations: NPO (ice chips OK for comfort; 1:1 assistance and direct supervision with ice chips) Medication Administration: Via alternative means Postural Changes and/or Swallow Maneuvers: Seated upright 90 degrees                  Oral care QID;Staff/trained caregiver to provide oral care (oral care prior to ice chips)   Frequent or constant Supervision/Assistance Dysphagia, oropharyngeal phase (R13.12)     Continue with current plan of care    Clyde Canterbury, M.S., CCC-SLP Speech-Language Pathologist [facility] [phone] Arnette Felts)  Woodroe Chen  03/30/2023, 10:08 AM

## 2023-03-31 ENCOUNTER — Other Ambulatory Visit: Payer: Medicare HMO | Admitting: Radiology

## 2023-03-31 DIAGNOSIS — F0390 Unspecified dementia without behavioral disturbance: Secondary | ICD-10-CM | POA: Diagnosis not present

## 2023-03-31 DIAGNOSIS — E872 Acidosis, unspecified: Secondary | ICD-10-CM

## 2023-03-31 DIAGNOSIS — E871 Hypo-osmolality and hyponatremia: Secondary | ICD-10-CM | POA: Diagnosis not present

## 2023-03-31 DIAGNOSIS — R531 Weakness: Secondary | ICD-10-CM | POA: Diagnosis not present

## 2023-03-31 DIAGNOSIS — R410 Disorientation, unspecified: Secondary | ICD-10-CM | POA: Diagnosis not present

## 2023-03-31 DIAGNOSIS — E860 Dehydration: Secondary | ICD-10-CM | POA: Diagnosis not present

## 2023-03-31 DIAGNOSIS — I10 Essential (primary) hypertension: Secondary | ICD-10-CM | POA: Diagnosis not present

## 2023-03-31 MED ORDER — ENSURE ENLIVE PO LIQD
237.0000 mL | Freq: Three times a day (TID) | ORAL | Status: DC
  Administered 2023-03-31 – 2023-04-01 (×2): 237 mL via ORAL

## 2023-03-31 MED ORDER — ADULT MULTIVITAMIN W/MINERALS CH
1.0000 | ORAL_TABLET | Freq: Every day | ORAL | Status: DC
  Administered 2023-04-02: 1 via ORAL
  Filled 2023-03-31 (×2): qty 1

## 2023-03-31 NOTE — Progress Notes (Signed)
Progress Note   Patient: Kimberly Cook ZOX:[id number] DOB: 14-May-1946 DOA: 03/26/2023     4 DOS: the patient was seen and examined on 03/31/2023   Brief hospital course: [name] is a 77 year old female with history of dementia, recent COVID-19 infection, hyperlipidemia, hypertension, neuropathy, insomnia, depression, anxiety, who presents to the emergency department for chief concerns of altered mental status, failure to thrive.  Patient is from [facility].  Vitals in the ED showed temperature of 98.8, respiration rate of 17, heart rate of 100, blood pressure 160/111, SpO2 of 100% on room air.  Serum sodium is 130, potassium 3.3, chloride 99, bicarb 14, BUN of 24, serum creatinine 0.70, eGFR greater than 60, nonfasting blood glucose 127, WBC 7.1, hemoglobin 15.5, platelets of 231.  High sensitivity troponin was 13.  TSH was 2.044.  ED treatment: LR 2 L bolus x 2.  8/22: Vital stable.  Likely secondary to recent COVID-19 infection with poor p.o. intake. Labs with improving hyponatremia with sodium at 132, potassium 3, CO2 19.  Procalcitonin 0.11.  Replating more potassium and magnesium as magnesium was borderline yesterday.  Not participating with active care, refusing mostly p.o. intake. Swallow evaluation was also ordered.  Per son and DIL, patient was recently taken away her financial privilege due to advanced dementia, since then she appears mostly depressed, poor p.o. intake and failure to thrive picture at her facility.  Palliative care was also consulted.  8/23: Mildly elevated blood pressure at 142/105.  Refusing all p.o. intake and pocketing food and medication and then spitting it out.  Per son this is being going on for couple of weeks. Pending palliative care consult.  8/24: Patient continued to refuse p.o. intake, pocketing food, family would like to see her response over the weekend before decided about full comfort care.  8/25: Patient continued to  refuse p.o. intake but little more interactive with family today, family is not requesting trial of PEG tube for feeding.  IR was consulted, patient has not taken aspirin since in the hospital as refusing all p.o. intake, it was discontinued from med orders, as requested by IR.  8/26: Patient with significant improvement today, started drinking ginger ale and Ensure, she does not want a feeding tube.  More alert and interactive.  Willing to try food.  Will be high risk for refeeding.  Assessment and Plan: * Altered mental status I suspect this is multifactorial in setting of recent COVID-19 infection along with poor appetite leading to clinical dehydration.  History of advanced dementia and worsening depression by family.  Patient was not eating well over the past couple of weeks at facility.  Concern of failure to thrive. CT head without any acute abnormality. Patient started eating and drinking today, swallow team is recommending dysphagia 1 diet. -Much more alert and able to participate. -Will be high risk for refeeding -Monitor electrolyte -Dietitian consult  Generalized weakness Failure to thrive. Patient with advanced dementia, much improved mentation today Concern of failure to thrive.  Recent COVID infection is contributory. Patient started eating and drinking today. Will be high risk for refeeding -Palliative care is on board -Monitor electrolytes  Dehydration With hemoconcentration, elevated BUN and metabolic acidosis Mucosa dry on presentation Status post LR 2 L bolus per EDP Sodium chloride infusion at 100 mL/h, 1 day ordered Recheck BMP in a.m.  Essential hypertension Not controlled on admission Home losartan 12.5 mg daily resumed  Hypokalemia Hypokalemia resolved with IV replacement.  Found to have hypophosphatemia  today which is being repleted.  Hypothyroidism Home levothyroxine 100 mcg daily before breakfast resumed  Dementia without behavioral disturbance  (HCC) Diagnosed approximately 1 month ago Oriented to name only   Anxiety and depression Home Celexa 20 mg daily, trazodone 100 mg nightly for resumed Alprazolam 0.5 mg p.o. every 6 hours as needed for anxiety resumed  Hyperlipidemia -Continue home statin  GERD (gastroesophageal reflux disease) -Continue PPI  Coronary artery disease involving native coronary artery of native heart without angina pectoris -Continue home aspirin and statin   Subjective: Patient was more interactive and asking to drink and eat today.  She was able to participate with swallow evaluation and consuming the food which was being offered.  Physical Exam: Vitals:   03/30/23 1938 03/31/23 0415 03/31/23 0500 03/31/23 0814  BP: (!) 153/112 131/87  (!) 162/108  Pulse: 81 (!) 108  (!) 105  Resp: 18 18  16   Temp: 98.1 F (36.7 C) 98 F (36.7 C)  98.2 F (36.8 C)  TempSrc: Oral Oral  Oral  SpO2: 92% 100%  100%  Weight:   73 kg   Height:       General.  Frail but pleasant elderly lady, in no acute distress. Pulmonary.  Lungs clear bilaterally, normal respiratory effort. CV.  Regular rate and rhythm, no JVD, rub or murmur. Abdomen.  Soft, nontender, nondistended, BS positive. CNS.  Alert and oriented x 2.  No focal neurologic deficit. Extremities.  No edema, no cyanosis, pulses intact and symmetrical. Psychiatry.  Judgment and insight appears impaired.  Data Reviewed: Prior data reviewed  Family Communication: Discussed with son at bedside  Disposition: Status is: Inpatient Remains inpatient appropriate because: Severity of illness  Planned Discharge Destination: Skilled nursing facility  DVT prophylaxis.  Lovenox Time spent: 42 minutes  This record has been created using Conservation officer, historic buildings. Errors have been sought and corrected,but may not always be located. Such creation errors do not reflect on the standard of care.   Author: Arnetha Courser, MD 03/31/2023 2:07 PM  For on  call review [email or web address].

## 2023-03-31 NOTE — Progress Notes (Signed)
Nutrition Follow Up Note   DOCUMENTATION CODES:   Not applicable  INTERVENTION:   Ensure Enlive po TID, each supplement provides 350 kcal and 20 grams of protein.  Magic cup TID with meals, each supplement provides 290 kcal and 9 grams of protein  MVI po daily   Pt at high refeed risk; recommend monitor potassium, magnesium and phosphorus labs daily until stable  Daily weights   48 hour calorie count   NUTRITION DIAGNOSIS:   Inadequate oral intake related to acute illness as evidenced by meal completion < 25%. -ongoing   GOAL:   Patient will meet greater than or equal to 90% of their needs -not met   MONITOR:   PO intake, Supplement acceptance, Labs, Weight trends, I & O's, Skin  ASSESSMENT:   77 y/o female with h/o HLD, GERD s/p gastroplication, hypothyroid disease, HTN, CAD, anxiety, depression, CKD III, dementia, DDD, chronic pain, CHF, NICM and hiatal hernia who is admitted with COVID 19.  Pt able to be placed on a diet today. MD is requesting a calorie count. RD will add supplements and MVI to help pt meet her estimated needs. Pt is at high refeed risk. RD would not recommend G-tube placement as this will not likely improve pt's quality of life giving her dementia and recent decline in cognition and bed bound status.    Per chart, pt is down 3lbs(2%) since admission. Pt is down 27lbs(15%) over the past 2 months; this is significant weight loss.    Medications reviewed and include: oscal w/ D, celexa, lovenox, synthroid, melatonin, remeron, protonix, miralax, LRS @75ml /hr  Labs reviewed: Na 132(L), K 3.8 wnl, BUN 6(L), P 1.6(L)- 8/23  Diet Order:   Diet Order             DIET - DYS 1 Room service appropriate? Yes with Assist; Fluid consistency: Thin  Diet effective now                  EDUCATION NEEDS:   Not appropriate for education at this time  Skin:  Skin Assessment: Reviewed RN Assessment  Last BM:  8/24- type 7  Height:   Ht Readings from  Last 1 Encounters:  03/26/23 5\' 4"  (1.626 m)    Weight:   Wt Readings from Last 1 Encounters:  03/31/23 73 kg    Ideal Body Weight:  54.5 kg  BMI:  Body mass index is 27.62 kg/m.  Estimated Nutritional Needs:   Kcal:  1700-1900kcal/day  Protein:  85-95g/day  Fluid:  1.4-1.6L/day  Betsey Holiday MS, RD, LDN Please refer to [facility] for RD and/or RD on-call/weekend/after hours pager

## 2023-03-31 NOTE — Progress Notes (Signed)
Speech Language Pathology Treatment: Dysphagia  Patient Details Name: Kimberly Cook MRN: [id number] DOB: 1946/04/09 Today's Date: 03/31/2023 Time: 4098-1191 SLP Time Calculation (min) (ACUTE ONLY): 55 min  Assessment / Plan / Recommendation Clinical Impression  Pt seen for continued po trials today in hopes to upgrade to a least restrictive oral diet. Pt was awake, and verbal today -- though slow to complete her full thought(s) verbally (suspect impact from Dementia baseline).  On RA, afebrile, WBC WNL.   Per MD notes and Son, Family had indicated a desire for a PEG placement for TFs for nutrition. Today, Son stated they "hope she won't need one" after seeing her drink Gingerale yesterday. Encouraged him to discuss this further w/ MD (who arrived during session).    Pt was presented w/ po trials -- including Gingerale which she asked for (Son stated she drinks little to no water at baseline).  W/ trials of thin liquids(water, then soda) and puree(vanilla pudding -- pt picked from choices), pt appears to present w/ grossly functional oropharyngeal phase swallow w/ No pharyngeal phase dysphagia noted, No neuromuscular deficits impacting the pharyngeal swallow noted.  Pt consumed po trials w/ No overt clinical s/s of aspiration during po trials. Pt appears at reduced risk for aspiration following general aspiration precautions, given support w/ feeding/intake, and when using a modified diet consistency as recommended.   However, pt does have challenging factors that could impact her oropharyngeal swallowing to include Baseline Dementia and need for support w/ eating/drinking (reduced awareness). ANY Cognitive decline can impact timing/awareness of swallowing thus increase risk for aspiration/dysphagia as well as decreased oral intake overall -- which has been seen and is Baseline for pt, per Son's report.   During po trials, pt consumed the trials w/ no overt coughing, decline in vocal quality,  or change in respiratory presentation during/post trials. Oral phase c/b lengthier time needed for bolus management then A-P transfer for swallowing -- control was appropriate but timely engagement/initiation was delayed (suspect impact from Dementia). Oral clearing achieved w/ all trials.  Pt helped to feed self w/ support.   Recommend a Dysphagia 1 (puree) consistency diet w/ well-moistened foods; Thin liquids -- carefully monitor straw use, and pt should help to Hold Cup when drinking. Recommend general aspiration precautions, reduce distractions during meals. Have drinks/food in sight to visually give cues for oral intake.  Pills CRUSHED in Puree for safer, easier swallowing. MD would like to give TIME to see if pt will be able to manage functionally w/ an oral diet -- consume enough orally.  Education given on Pills in Puree; food consistencies and options to encourage oral intake; general aspiration precautions; swallowing/intake w/ Dementia dx to pt and Son. ST services will continue to monitor pt's status next 1-2 days. Dietician f/u for calorie count per MD. NSG to support/monitor at meals, w/ medication swallowing. MD agreed. Recommend Palliative Care f/u for support. Son agreed w/ above.      HPI HPI: Pt is a 77 year old female with history of advanced Dementia, recent COVID-19 infection, hyperlipidemia, hypertension, neuropathy, insomnia, depression, anxiety, who presents to the emergency department for chief concerns of altered mental status, failure to thrive.  Patient is from [facility].  Per Son and chart reports, "pt has been refusing all p.o. intake and pocketing food and medication and then spitting it out.  Per son this is being going on for couple of weeks".  Per chart, "poor p.o. intake and failure to thrive picture at her facility.  Palliative care was also consulted".  Head CT: no acute abnormalities.  CXR: No acute cardiopulmonary disease.      SLP Plan  Continue with  current plan of care      Recommendations for follow up therapy are one component of a multi-disciplinary discharge planning process, led by the attending physician.  Recommendations may be updated based on patient status, additional functional criteria and insurance authorization.    Recommendations  Diet recommendations: Dysphagia 1 (puree);Thin liquid Liquids provided via: Cup;Straw (monitor) Medication Administration: Crushed with puree Supervision: Patient able to self feed;Staff to assist with self feeding;Full supervision/cueing for compensatory strategies Compensations: Minimize environmental distractions;Small sips/bites;Slow rate;Lingual sweep for clearance of pocketing;Follow solids with liquid Postural Changes and/or Swallow Maneuvers: Out of bed for meals;Seated upright 90 degrees;Upright 30-60 min after meal                 (Palliative Care consult; Dietician f/u) Oral care BID;Oral care before and after PO;Staff/trained caregiver to provide oral care (denture care)   Frequent or constant Supervision/Assistance (Dementia) Dysphagia, oral phase (R13.11) (Baseline Dementia)     Continue with current plan of care       Jerilynn Som, MS, CCC-SLP Speech Language Pathologist Rehab Services; [facility] [phone] (ascom)  Yared Barefoot  03/31/2023, 1:52 PM

## 2023-03-31 NOTE — Progress Notes (Signed)
Brief Progress Note Gastrostomy tube placement was discussed with patient's son, Shelly Rubenstein, today at approximately 1230. Patient's son stated that the patient was able to tolerate some PO intake last night and this morning, and that they would like to hold off on g-tube placement at this time. IR service to sign off at this time. IR will continue to remain available if patient requires g-tube placement in the future. Please contact IR team with any questions or concerns. Kennieth Francois, PA-C 03/31/2023 12:44 PM

## 2023-03-31 NOTE — Progress Notes (Signed)
Palliative Care Progress Note, Assessment & Plan   Patient Name: Kimberly Cook       Date: 03/31/2023 DOB: 1946/03/09  Age: 77 y.o. MRN#: [id number] Attending Physician: Arnetha Courser, MD Primary Care Physician: [facility] Admit Date: 03/26/2023  Subjective: Patient is sitting up in bed.  She acknowledges my presence.  She is alert to self and place.  Her son [name] is present during my visit.  HPI: 77 y.o. female  with past medical history of dementia, HLD, HTN, neuropathy, insomnia, depression, and anxiety admitted from LTC part of [facility] on 03/26/2023 with AMS.   Patient found to be COVID-positive and failure to thrive.   CT of head was negative.  Patient being treated with electrolyte replacement and IVF.   PMT was consulted to discuss goals of care.   Of note, patient is familiar to our service as I saw patient during her September 2023 hospitalization  Summary of counseling/coordination of care: After reviewing the patient's chart and assessing the patient at bedside, I spoke with patient in regards to plan of care.  I attempted to gauge patient's current understanding of her medical situation.  She is much more alert and interactive than she has been during my previous visits.  However, she is only able to say her name and that she is in the hospital.  Symptoms assessed.  Patient endorses some discomfort in left arm.  Assessment of left arm revealed wristwatch was tight.  Wristwatch removed and patient endorsed relief of discomfort.  Other armbands adjusted so they are not too tight on patient's arms.  Patient denied nausea, vomiting, or other pain/discomfort at this time.  No adjustments to more needed.  Patient asked how much she needed to eat.  We discussed the  importance of nutrition for appropriate healing.  Encourage patient to take small bites.  Advised her not to eat large amounts but to take small bites throughout the day.  She says she is enjoying her putting and that she will try to eat as much as she can.  I then spoke with patient's son [name] outside of the room.  He shares that he is happy that she seems to have "come around a little bit".  We discussed importance of functional, nutritional, and cognitive status is important indicators of patient's overall prognosis.  Specifically, we discussed nutrition.  [name] shares he and his brother do not want to place a feeding tube at this time.  They want to see how much she is able to eat naturally.  Feeding tube placement on hold for today.  [name] was appreciative of my visit.  PMT will continue to follow and support patient throughout her hospitalization.  Physical Exam Vitals reviewed.  Constitutional:      General: She is not in acute distress.    Appearance: She is obese.  HENT:     Head: Normocephalic.     Mouth/Throat:     Mouth: Mucous membranes are moist.  Eyes:     Pupils: Pupils are equal, round, and reactive to light.  Abdominal:     Palpations: Abdomen is soft.  Musculoskeletal:     Comments: Generalized weakness, MAETC  Skin:  General: Skin is warm and dry.  Neurological:     Mental Status: She is alert.     Comments: Oriented to self  Psychiatric:        Behavior: Behavior normal.             Total Time 35 minutes   Joycelin Radloff L. Bonita Quin, DNP, FNP-BC Palliative Medicine Team

## 2023-04-01 DIAGNOSIS — I10 Essential (primary) hypertension: Secondary | ICD-10-CM | POA: Diagnosis not present

## 2023-04-01 DIAGNOSIS — R410 Disorientation, unspecified: Secondary | ICD-10-CM | POA: Diagnosis not present

## 2023-04-01 DIAGNOSIS — R531 Weakness: Secondary | ICD-10-CM | POA: Diagnosis not present

## 2023-04-01 DIAGNOSIS — E860 Dehydration: Secondary | ICD-10-CM | POA: Diagnosis not present

## 2023-04-01 LAB — CBC
HCT: 41.1 % (ref 36.0–46.0)
Hemoglobin: 14.5 g/dL (ref 12.0–15.0)
MCH: 30 pg (ref 26.0–34.0)
MCHC: 35.3 g/dL (ref 30.0–36.0)
MCV: 84.9 fL (ref 80.0–100.0)
Platelets: 146 10*3/uL — ABNORMAL LOW (ref 150–400)
RBC: 4.84 MIL/uL (ref 3.87–5.11)
RDW: 12.5 % (ref 11.5–15.5)
WBC: 4 10*3/uL (ref 4.0–10.5)
nRBC: 0 % (ref 0.0–0.2)

## 2023-04-01 LAB — BASIC METABOLIC PANEL
Anion gap: 9 (ref 5–15)
BUN: 9 mg/dL (ref 8–23)
CO2: 20 mmol/L — ABNORMAL LOW (ref 22–32)
Calcium: 8.4 mg/dL — ABNORMAL LOW (ref 8.9–10.3)
Chloride: 101 mmol/L (ref 98–111)
Creatinine, Ser: 0.49 mg/dL (ref 0.44–1.00)
GFR, Estimated: >60 mL/min (ref >60)
Glucose, Bld: 109 mg/dL — ABNORMAL HIGH (ref 70–99)
Potassium: 2.5 mmol/L — CL (ref 3.5–5.1)
Sodium: 130 mmol/L — ABNORMAL LOW (ref 135–145)

## 2023-04-01 LAB — PHOSPHORUS: Phosphorus: 2.7 mg/dL (ref 2.5–4.6)

## 2023-04-01 LAB — MAGNESIUM: Magnesium: 1.8 mg/dL (ref 1.7–2.4)

## 2023-04-01 MED ORDER — LABETALOL HCL 5 MG/ML IV SOLN
20.0000 mg | INTRAVENOUS | Status: DC | PRN
  Administered 2023-04-02: 20 mg via INTRAVENOUS
  Filled 2023-04-01 (×2): qty 4

## 2023-04-01 MED ORDER — POTASSIUM CHLORIDE 10 MEQ/100ML IV SOLN
10.0000 meq | INTRAVENOUS | Status: AC
  Administered 2023-04-01 (×6): 10 meq via INTRAVENOUS
  Filled 2023-04-01 (×6): qty 100

## 2023-04-01 NOTE — Progress Notes (Signed)
Calorie Count Day 1  Estimated Nutritional Needs:    Kcal:  1700-1900kcal/day Protein:  85-95g/day Fluid:  1.4-1.6L/day  Lunch 4/26: 0% meal, 50ml Ensure   Total- 88kcal and 5g protein   Dinner 4/26: 0% meal  Total- 0  Breakfast 4/27: 0% meal  Total- 0  Lunch 4/27: 0% meal  Total- 0  Total Intake:  88kcal (5% estimated needs) and  5g protein (5% estimated needs)  Betsey Holiday MS, RD, LDN Please refer to [facility] for RD and/or RD on-call/weekend/after hours pager

## 2023-04-01 NOTE — TOC Progression Note (Signed)
Transition of Care [facility]) - Progression Note    Patient Details  Name: Kimberly Cook MRN: [id number] Date of Birth: 07-30-1946  Transition of Care [facility]) CM/SW Contact  Margarito Liner, LCSW Phone Number: 04/01/2023, 9:31 AM  Clinical Narrative:   CSW continues to follow progress for return to [facility].  Expected Discharge Plan: Skilled Nursing Facility Barriers to Discharge: Continued Medical Work up  Expected Discharge Plan and Services       Living arrangements for the past 2 months: Skilled Nursing Facility                                       Social Determinants of Health (SDOH) Interventions SDOH Screenings   Food Insecurity: No Food Insecurity (03/27/2023)  Housing: Low Risk  (03/27/2023)  Transportation Needs: No Transportation Needs (03/27/2023)  Utilities: Not At Risk (03/27/2023)  Depression (PHQ2-9): Low Risk  (03/28/2022)  Financial Resource Strain: Low Risk  (07/24/2020)  Physical Activity: Unknown (07/24/2020)  Social Connections: Unknown (07/24/2020)  Stress: No Stress Concern Present (07/24/2020)  Tobacco Use: Medium Risk (03/26/2023)    Readmission Risk Interventions     No data to display

## 2023-04-01 NOTE — Progress Notes (Signed)
Progress Note   Patient: Kimberly Cook:[id number] DOB: Jun 22, 1946 DOA: 03/26/2023     5 DOS: the patient was seen and examined on 04/01/2023   Brief hospital course: [name] is a 77 year old female with history of dementia, recent COVID-19 infection, hyperlipidemia, hypertension, neuropathy, insomnia, depression, anxiety, who presents to the emergency department for chief concerns of altered mental status, failure to thrive.  Patient is from [facility].  Vitals in the ED showed temperature of 98.8, respiration rate of 17, heart rate of 100, blood pressure 160/111, SpO2 of 100% on room air.  Serum sodium is 130, potassium 3.3, chloride 99, bicarb 14, BUN of 24, serum creatinine 0.70, eGFR greater than 60, nonfasting blood glucose 127, WBC 7.1, hemoglobin 15.5, platelets of 231.  High sensitivity troponin was 13.  TSH was 2.044.  ED treatment: LR 2 L bolus x 2.  8/22: Vital stable.  Likely secondary to recent COVID-19 infection with poor p.o. intake. Labs with improving hyponatremia with sodium at 132, potassium 3, CO2 19.  Procalcitonin 0.11.  Replating more potassium and magnesium as magnesium was borderline yesterday.  Not participating with active care, refusing mostly p.o. intake. Swallow evaluation was also ordered.  Per son and DIL, patient was recently taken away her financial privilege due to advanced dementia, since then she appears mostly depressed, poor p.o. intake and failure to thrive picture at her facility.  Palliative care was also consulted.  8/23: Mildly elevated blood pressure at 142/105.  Refusing all p.o. intake and pocketing food and medication and then spitting it out.  Per son this is being going on for couple of weeks. Pending palliative care consult.  8/24: Patient continued to refuse p.o. intake, pocketing food, family would like to see her response over the weekend before decided about full comfort care.  8/25: Patient continued to  refuse p.o. intake but little more interactive with family today, family is not requesting trial of PEG tube for feeding.  IR was consulted, patient has not taken aspirin since in the hospital as refusing all p.o. intake, it was discontinued from med orders, as requested by IR.  8/26: Patient with significant improvement today, started drinking ginger ale and Ensure, she does not want a feeding tube.  More alert and interactive.  Willing to try food.  Will be high risk for refeeding.  8/27: Patient was very lethargic and somnolent this morning, unable to take any p.o.  Apparently received Xanax for agitation overnight, should avoid any sedating medications.  Assessment and Plan: * Altered mental status I suspect this is multifactorial in setting of recent COVID-19 infection along with poor appetite leading to clinical dehydration.  History of advanced dementia and worsening depression by family.  Patient was not eating well over the past couple of weeks at facility.  Concern of failure to thrive. CT head without any acute abnormality. Patient started eating and drinking yesterday and dietitian placed supplement orders, this morning again very lethargic likely secondary to Xanax. -Avoid sedating medications -Will be high risk for refeeding -Monitor electrolyte -Dietitian consult  Generalized weakness Failure to thrive. Patient with advanced dementia, much improved mentation today Concern of failure to thrive.  Recent COVID infection is contributory. Patient started eating and drinking today. Will be high risk for refeeding -Palliative care is on board -Monitor electrolytes  Dehydration With hemoconcentration, elevated BUN and metabolic acidosis Mucosa dry on presentation Status post LR 2 L bolus per EDP Sodium chloride infusion at 100 mL/h, 1 day  ordered Recheck BMP in a.m.  Essential hypertension Not controlled on admission Home losartan 12.5 mg daily  resumed  Hypokalemia Hypokalemia resolved with IV replacement.  Found to have hypophosphatemia today which is being repleted.  Hypothyroidism Home levothyroxine 100 mcg daily before breakfast resumed  Dementia without behavioral disturbance (HCC) Diagnosed approximately 1 month ago Oriented to name only   Anxiety and depression Home Celexa 20 mg daily, trazodone 100 mg nightly for resumed Alprazolam 0.5 mg p.o. every 6 hours as needed for anxiety resumed  Hyperlipidemia -Continue home statin  GERD (gastroesophageal reflux disease) -Continue PPI  Coronary artery disease involving native coronary artery of native heart without angina pectoris -Continue home aspirin and statin   Subjective: Patient was quite somnolent when seen today.  Physical Exam: Vitals:   04/01/23 0023 04/01/23 0427 04/01/23 0500 04/01/23 0915  BP: (!) 144/96 (!) 137/90  (!) 144/111  Pulse: (!) 102 95  98  Resp: 20 16  15   Temp: 98.1 F (36.7 C) 97.8 F (36.6 C)  98.3 F (36.8 C)  TempSrc: Oral Axillary  Oral  SpO2: 100% 99%  100%  Weight:   75.7 kg   Height:       General.  Somnolent elderly lady, in no acute distress. Pulmonary.  Lungs clear bilaterally, normal respiratory effort. CV.  Regular rate and rhythm, no JVD, rub or murmur. Abdomen.  Soft, nontender, nondistended, BS positive. CNS.  Somnolent, hardly open eyes when called her name and going back to sleep Extremities.  No edema, no cyanosis, pulses intact and symmetrical. Psychiatry.  Judgment and insight appears impaired  Data Reviewed: Prior data reviewed  Family Communication: No family at bedside today, called son with no response  Disposition: Status is: Inpatient Remains inpatient appropriate because: Severity of illness  Planned Discharge Destination: Skilled nursing facility  DVT prophylaxis.  Lovenox Time spent: 40 minutes  This record has been created using Conservation officer, historic buildings. Errors have been  sought and corrected,but may not always be located. Such creation errors do not reflect on the standard of care.   Author: Arnetha Courser, MD 04/01/2023 1:02 PM  For on call review [email or web address].

## 2023-04-01 NOTE — Plan of Care (Signed)
  Problem: Clinical Measurements: Goal: Will remain free from infection Outcome: Progressing   Problem: Clinical Measurements: Goal: Respiratory complications will improve Outcome: Progressing   Problem: Clinical Measurements: Goal: Respiratory complications will improve Outcome: Progressing

## 2023-04-02 DIAGNOSIS — I10 Essential (primary) hypertension: Secondary | ICD-10-CM | POA: Diagnosis not present

## 2023-04-02 DIAGNOSIS — E860 Dehydration: Secondary | ICD-10-CM | POA: Diagnosis not present

## 2023-04-02 DIAGNOSIS — F0390 Unspecified dementia without behavioral disturbance: Secondary | ICD-10-CM | POA: Diagnosis not present

## 2023-04-02 DIAGNOSIS — Z515 Encounter for palliative care: Secondary | ICD-10-CM | POA: Diagnosis not present

## 2023-04-02 DIAGNOSIS — R410 Disorientation, unspecified: Secondary | ICD-10-CM | POA: Diagnosis not present

## 2023-04-02 DIAGNOSIS — R531 Weakness: Secondary | ICD-10-CM | POA: Diagnosis not present

## 2023-04-02 LAB — PHOSPHORUS: Phosphorus: 2.6 mg/dL (ref 2.5–4.6)

## 2023-04-02 LAB — CBC
HCT: 41.6 % (ref 36.0–46.0)
Hemoglobin: 14.9 g/dL (ref 12.0–15.0)
MCH: 30.2 pg (ref 26.0–34.0)
MCHC: 35.8 g/dL (ref 30.0–36.0)
MCV: 84.4 fL (ref 80.0–100.0)
Platelets: 159 10*3/uL (ref 150–400)
RBC: 4.93 MIL/uL (ref 3.87–5.11)
RDW: 12.4 % (ref 11.5–15.5)
WBC: 3.8 10*3/uL — ABNORMAL LOW (ref 4.0–10.5)
nRBC: 0 % (ref 0.0–0.2)

## 2023-04-02 LAB — BASIC METABOLIC PANEL
Anion gap: 9 (ref 5–15)
BUN: 7 mg/dL — ABNORMAL LOW (ref 8–23)
CO2: 23 mmol/L (ref 22–32)
Calcium: 8.6 mg/dL — ABNORMAL LOW (ref 8.9–10.3)
Chloride: 99 mmol/L (ref 98–111)
Creatinine, Ser: 0.51 mg/dL (ref 0.44–1.00)
GFR, Estimated: >60 mL/min (ref >60)
Glucose, Bld: 126 mg/dL — ABNORMAL HIGH (ref 70–99)
Potassium: 3.1 mmol/L — ABNORMAL LOW (ref 3.5–5.1)
Sodium: 131 mmol/L — ABNORMAL LOW (ref 135–145)

## 2023-04-02 LAB — MAGNESIUM: Magnesium: 1.4 mg/dL — ABNORMAL LOW (ref 1.7–2.4)

## 2023-04-02 MED ORDER — POTASSIUM CHLORIDE 10 MEQ/100ML IV SOLN
10.0000 meq | INTRAVENOUS | Status: AC
  Administered 2023-04-02 (×4): 10 meq via INTRAVENOUS
  Filled 2023-04-02 (×4): qty 100

## 2023-04-02 MED ORDER — MAGNESIUM SULFATE 4 GM/100ML IV SOLN
4.0000 g | Freq: Once | INTRAVENOUS | Status: AC
  Administered 2023-04-02: 4 g via INTRAVENOUS
  Filled 2023-04-02: qty 100

## 2023-04-02 MED ORDER — POTASSIUM CHLORIDE 20 MEQ PO PACK: 40.0000 meq | PACK | Freq: Once | ORAL | Status: DC

## 2023-04-02 NOTE — Progress Notes (Signed)
Palliative Care Progress Note, Assessment & Plan   Patient Name: Kimberly Cook       Date: 04/02/2023 DOB: Jan 19, 1946  Age: 77 y.o. MRN#: [id number] Attending Physician: Arnetha Courser, MD Primary Care Physician: [facility] Admit Date: 03/26/2023  Subjective: Patient is sitting up but sleeping in bed.  She is in no respiratory or apparent distress.  She awakens easily but quickly falls back to sleep.  No family or friends present during my visit.    HPI: 77 y.o. female  with past medical history of dementia, HLD, HTN, neuropathy, insomnia, depression, and anxiety admitted from LTC part of [facility] on 03/26/2023 with AMS.   Patient found to be COVID-positive and failure to thrive.   CT of head was negative.  Patient being treated with electrolyte replacement and IVF.   PMT was consulted to discuss goals of care.   Of note, patient is familiar to our service as I saw patient during her September 2023 hospitalization.  Summary of counseling/coordination of care: After reviewing the patient's chart and assessing the patient at bedside, I spoke with her son Kimberly Cook over the phone in regards to plan and boundaries of care.  Brief medical update given.  Conveyed concerns that patient's mental status has again declined.  She is no longer excepting p.o. intake as she had this past weekend.    Discussed again that dementia is a progressive, irreversible, and chronic disease.  Often times patients behaviors are non predictable and change day to day.  However, I outlined that patient's overall p.o. intake is consistently low.  She is not taking in enough calories to sustain her life.  She is also being artificially hydrated by IVF.    I expressed my concern and that patient will continue on  this pattern of dehydration and poor p.o. intake, return to the hospital for IVF and fluid resuscitation, and then return to her facility only to have the same process happen over again.  I attempted to elicit goals and values important to Kimberly Cook and his family.  He shares he was hopeful that his mother was improving given her increasing p.o. intake on Monday.  BUt, understands now that she is back to her "old ways" of not wanting to eat or drink again.   He says his brother spoke with the provider yesterday and mentioned hospice.  Discussed hospice philosophy and that hospice is not, at the moment, for EOL care for his mother, but to allow her to age and place.    He is concerned that she will not be offered food/drink at [facility]. Discussed food is offered. Reviewed difference between "starving" and withholding food versus patient not asking/wanting PO intake.   We reviewed cyclic nature of dehydration/poor p.o. intake and likelihood that patient would need future hospitalizations.  Kimberly Cook shares that he is not sure how he and his family feel about continuing this pattern.  However, he is not ready to speak with hospice at this time.  Therapeutic silence, active listening, and emotional support provided.  Kimberly Cook would like to speak with his family this evening.  We plan to touch base again tomorrow to discuss goals of care as patient is  approaching transfer back to [facility].  PMT will continue to follow.   Physical Exam Vitals reviewed.  Constitutional:      General: She is not in acute distress. HENT:     Head: Normocephalic.     Mouth/Throat:     Mouth: Mucous membranes are moist.  Abdominal:     Palpations: Abdomen is soft.  Skin:    General: Skin is warm and dry.     Coloration: Skin is pale.  Neurological:     Mental Status: She is alert.  Psychiatric:        Behavior: Behavior normal.             Total Time 50 minutes   Kimberly Cook L. Kimberly Quin, DNP, FNP-BC Palliative Medicine  Team

## 2023-04-02 NOTE — Progress Notes (Signed)
Calorie Count Day 2  Estimated Nutritional Needs:    Kcal:  1700-1900kcal/day Protein:  85-95g/day Fluid:  1.4-1.6L/day  Dinner 4/27: 0% meal  Total- 0  Breakfast 4/28: 0% meal  Total- 0  Lunch 4/28: 0% meal, few sips gingerale   Total- 10kcal and 0g protein   Total Intake:  10kcal (<5% estimated needs) and  0g protein (0% estimated needs)  Betsey Holiday MS, RD, LDN Please refer to [facility] for RD and/or RD on-call/weekend/after hours pager

## 2023-04-02 NOTE — Progress Notes (Signed)
Progress Note   Patient: Kimberly Cook WUJ:[id number] DOB: November 15, 1945 DOA: 03/26/2023     6 DOS: the patient was seen and examined on 04/02/2023   Brief hospital course: [name] is a 77 year old female with history of dementia, recent COVID-19 infection, hyperlipidemia, hypertension, neuropathy, insomnia, depression, anxiety, who presents to the emergency department for chief concerns of altered mental status, failure to thrive.  Patient is from [facility].  Vitals in the ED showed temperature of 98.8, respiration rate of 17, heart rate of 100, blood pressure 160/111, SpO2 of 100% on room air.  Serum sodium is 130, potassium 3.3, chloride 99, bicarb 14, BUN of 24, serum creatinine 0.70, eGFR greater than 60, nonfasting blood glucose 127, WBC 7.1, hemoglobin 15.5, platelets of 231.  High sensitivity troponin was 13.  TSH was 2.044.  ED treatment: LR 2 L bolus x 2.  8/22: Vital stable.  Likely secondary to recent COVID-19 infection with poor p.o. intake. Labs with improving hyponatremia with sodium at 132, potassium 3, CO2 19.  Procalcitonin 0.11.  Replating more potassium and magnesium as magnesium was borderline yesterday.  Not participating with active care, refusing mostly p.o. intake. Swallow evaluation was also ordered.  Per son and DIL, patient was recently taken away her financial privilege due to advanced dementia, since then she appears mostly depressed, poor p.o. intake and failure to thrive picture at her facility.  Palliative care was also consulted.  8/23: Mildly elevated blood pressure at 142/105.  Refusing all p.o. intake and pocketing food and medication and then spitting it out.  Per son this is being going on for couple of weeks. Pending palliative care consult.  8/24: Patient continued to refuse p.o. intake, pocketing food, family would like to see her response over the weekend before decided about full comfort care.  8/25: Patient continued to  refuse p.o. intake but little more interactive with family today, family is not requesting trial of PEG tube for feeding.  IR was consulted, patient has not taken aspirin since in the hospital as refusing all p.o. intake, it was discontinued from med orders, as requested by IR.  8/26: Patient with significant improvement today, started drinking ginger ale and Ensure, she does not want a feeding tube.  More alert and interactive.  Willing to try food.  Will be high risk for refeeding.  8/27: Patient was very lethargic and somnolent this morning, unable to take any p.o.  Apparently received Xanax for agitation overnight, should avoid any sedating medications.  8/28: Patient more alert but again refusing p.o. intake.  Replacing magnesium and potassium. Ongoing goals of care discussion regarding going back to facility with hospice which seems appropriate at this point.  Assessment and Plan: * Altered mental status Waxing and waning. I suspect this is multifactorial in setting of recent COVID-19 infection along with poor appetite leading to clinical dehydration.  History of advanced dementia and worsening depression by family.  Patient was not eating well over the past couple of weeks at facility.  Concern of failure to thrive. CT head without any acute abnormality. Patient again refusing most of the p.o. intake after accepting few bites 1 day. She seems appropriate going back to facility with hospice at this point -Avoid sedating medications -Will be high risk for refeeding -Monitor electrolyte -Dietitian consult  Generalized weakness Failure to thrive. Patient with advanced dementia, much improved mentation today Concern of failure to thrive.  Recent COVID infection is contributory. Patient again refusing p.o. intake -Palliative  care is on board -Monitor electrolytes  Dehydration With hemoconcentration, elevated BUN and metabolic acidosis Mucosa dry on presentation Status post LR 2 L  bolus per EDP Sodium chloride infusion at 100 mL/h, 1 day ordered Recheck BMP in a.m.  Essential hypertension Not controlled on admission Home losartan 12.5 mg daily resumed  Hypokalemia Hypokalemia and hypomagnesemia today. -Replete electrolytes and monitor  Hypothyroidism Home levothyroxine 100 mcg daily before breakfast resumed  Dementia without behavioral disturbance (HCC) Diagnosed approximately 1 month ago Oriented to name only   Anxiety and depression Home Celexa 20 mg daily, trazodone 100 mg nightly for resumed Alprazolam 0.5 mg p.o. every 6 hours as needed for anxiety resumed  Hyperlipidemia -Continue home statin  GERD (gastroesophageal reflux disease) -Continue PPI  Coronary artery disease involving native coronary artery of native heart without angina pectoris -Continue home aspirin and statin   Subjective: Patient was awake and denies any pain.  Refusing p.o. intake stating that she is not hungry.  Physical Exam: Vitals:   04/01/23 1550 04/01/23 2036 04/02/23 0110 04/02/23 0829  BP: (!) 155/92 (!) 140/90 (!) 148/106 (!) 145/112  Pulse: 95 98 74 (!) 107  Resp: 16 16 16 16   Temp: 97.7 F (36.5 C) 97.9 F (36.6 C) 98.6 F (37 C) 98.2 F (36.8 C)  TempSrc: Oral   Oral  SpO2: 100% 100% 100% 99%  Weight:      Height:       General.  Frail elderly lady, in no acute distress. Pulmonary.  Lungs clear bilaterally, normal respiratory effort. CV.  Regular rate and rhythm, no JVD, rub or murmur. Abdomen.  Soft, nontender, nondistended, BS positive. CNS.  Alert and oriented .  No focal neurologic deficit. Extremities.  No edema, no cyanosis, pulses intact and symmetrical. Psychiatry.  Judgment and insight appears impaired.  Data Reviewed: Prior data reviewed  Family Communication: No family at bedside today, called son with no response.  Palliative care able to speak with 1 son  Disposition: Status is: Inpatient Remains inpatient appropriate because:  Severity of illness  Planned Discharge Destination: Skilled nursing facility  DVT prophylaxis.  Lovenox Time spent: 39 minutes  This record has been created using Conservation officer, historic buildings. Errors have been sought and corrected,but may not always be located. Such creation errors do not reflect on the standard of care.   Author: Arnetha Courser, MD 04/02/2023 3:02 PM  For on call review [email or web address].

## 2023-04-02 NOTE — Progress Notes (Signed)
SLP Cancellation Note  Patient Details Name: Kimberly Cook MRN: [id number] DOB: May 27, 1946   Cancelled treatment:       Reason Eval/Treat Not Completed:  (chart reviewed; secure chat w/ Team members re: pt's status today.)  Per MD/chart notes and consultation w/ Team, concerns that patient's mental status has again declined. She is no longer excepting p.o. intake as she had this past weekend. She remains alert but refuses oral intake. Palliative Care is in discussion w/ Family re: GOC moving forward; progression of Dementia. Per MD, there is discussion of returning to facility w/ Hospice services.  In setting of pt's refusal of oral intake, ST services will sign off at this time d/t lack of progression w/ skilled therapy. Recommend continue the current dysphagia diet and offer/provide opportunity of po's frequently during the day. MD to reconsult if any new swallowing needs arise during admit -- readiness to upgrade diet consistency(foods). Recommend general aspiration precautions.      Jerilynn Som, MS, CCC-SLP Speech Language Pathologist Rehab Services; [facility] [phone] (ascom) Purnell Daigle 04/02/2023, 6:26 PM

## 2023-04-02 NOTE — Plan of Care (Signed)
  Problem: Clinical Measurements: Goal: Will remain free from infection Outcome: Progressing   Problem: Clinical Measurements: Goal: Respiratory complications will improve Outcome: Progressing   Problem: Clinical Measurements: Goal: Respiratory complications will improve Outcome: Progressing   Problem: Clinical Measurements: Goal: Cardiovascular complication will be avoided Outcome: Progressing

## 2023-04-03 ENCOUNTER — Telehealth: Payer: Self-pay | Admitting: Internal Medicine

## 2023-04-03 DIAGNOSIS — I10 Essential (primary) hypertension: Secondary | ICD-10-CM | POA: Diagnosis not present

## 2023-04-03 DIAGNOSIS — R410 Disorientation, unspecified: Secondary | ICD-10-CM | POA: Diagnosis not present

## 2023-04-03 DIAGNOSIS — E872 Acidosis, unspecified: Secondary | ICD-10-CM | POA: Diagnosis not present

## 2023-04-03 DIAGNOSIS — R531 Weakness: Secondary | ICD-10-CM | POA: Diagnosis not present

## 2023-04-03 DIAGNOSIS — E871 Hypo-osmolality and hyponatremia: Secondary | ICD-10-CM | POA: Diagnosis not present

## 2023-04-03 DIAGNOSIS — E860 Dehydration: Secondary | ICD-10-CM | POA: Diagnosis not present

## 2023-04-03 DIAGNOSIS — F0390 Unspecified dementia without behavioral disturbance: Secondary | ICD-10-CM | POA: Diagnosis not present

## 2023-04-03 LAB — CBC
HCT: 35.9 % — ABNORMAL LOW (ref 36.0–46.0)
Hemoglobin: 12.5 g/dL (ref 12.0–15.0)
MCH: 29.6 pg (ref 26.0–34.0)
MCHC: 34.8 g/dL (ref 30.0–36.0)
MCV: 84.9 fL (ref 80.0–100.0)
Platelets: 150 10*3/uL (ref 150–400)
RBC: 4.23 MIL/uL (ref 3.87–5.11)
RDW: 12.6 % (ref 11.5–15.5)
WBC: 3.7 10*3/uL — ABNORMAL LOW (ref 4.0–10.5)
nRBC: 0 % (ref 0.0–0.2)

## 2023-04-03 LAB — MAGNESIUM: Magnesium: 1.9 mg/dL (ref 1.7–2.4)

## 2023-04-03 LAB — BASIC METABOLIC PANEL
Anion gap: 9 (ref 5–15)
BUN: 7 mg/dL — ABNORMAL LOW (ref 8–23)
CO2: 24 mmol/L (ref 22–32)
Calcium: 8.1 mg/dL — ABNORMAL LOW (ref 8.9–10.3)
Chloride: 100 mmol/L (ref 98–111)
Creatinine, Ser: 0.54 mg/dL (ref 0.44–1.00)
GFR, Estimated: >60 mL/min (ref >60)
Glucose, Bld: 115 mg/dL — ABNORMAL HIGH (ref 70–99)
Potassium: 3.5 mmol/L (ref 3.5–5.1)
Sodium: 133 mmol/L — ABNORMAL LOW (ref 135–145)

## 2023-04-03 LAB — PHOSPHORUS: Phosphorus: 2.6 mg/dL (ref 2.5–4.6)

## 2023-04-03 NOTE — Progress Notes (Signed)
[facility] Liaison Note:    Received a referral from [facility], Charlynn Court, LCSW, to assess patient for admission to the Hospice Home.  Patient is not appropriate for admission at this time. Patient's son, [name], was notified.  He stated that he would like for patient to return to [facility] with Hospice services when medically stable for discharge.    Referral submitted today.  Discharge anticipated for tomorrow, according to MD and TOC.  Please call with any Hospice related questions or concerns.    Thank you for the opportunity to participate in this patient's care  [facility] Liaison [phone]

## 2023-04-03 NOTE — Progress Notes (Signed)
                                                     Palliative Care Progress Note, Assessment & Plan   Patient Name: Kimberly Cook       Date: 04/03/2023 DOB: Oct 08, 1945  Age: 77 y.o. MRN#: [id number] Attending Physician: Arnetha Courser, MD Primary Care Physician: [facility] Admit Date: 03/26/2023  Subjective: Patient is sitting up in bed in no apparent distress.  She is asleep, easily awakens, and returns back to sleep.  She acknowledges my presence when awake.  She does not make any wishes known during my visit.  No family or friends present during my visit.  HPI: 78 y.o. female  with past medical history of dementia, HLD, HTN, neuropathy, insomnia, depression, and anxiety admitted from LTC part of [facility] on 03/26/2023 with AMS.   Patient found to be COVID-positive and failure to thrive.   CT of head was negative.  Patient being treated with electrolyte replacement and IVF.   PMT was consulted to discuss goals of care.   Of note, patient is familiar to our service as I saw patient during her September 2023 hospitalization.  Summary of counseling/coordination of care: After reviewing the patient's chart and assessing the patient at bedside, I counseled with dayshift RN Raoul Pitch and attending Dr. Nelson Chimes in regards to plan and goals of care.  Patient continues to have no p.o. intake despite multiple efforts of medical staff offering her food throughout the day and evening.  Given patient's underlying dementia, I think it is most appropriate for her to shift to comfort focused care.  Dr. Nelson Chimes is in agreement.  She spoke with family today and are in agreement to shift to comfort measures and have patient evaluated for hospice IPU. I notified TOC of family's wishes and asked that they offer choice of hospice agencies.   PMT will  continue to follow and support patient and family throughout her hospitalization.  Physical Exam Vitals reviewed.  Constitutional:      General: She is not in acute distress. HENT:     Nose: Nose normal.  Cardiovascular:     Rate and Rhythm: Normal rate.  Pulmonary:     Effort: Pulmonary effort is normal.  Abdominal:     Palpations: Abdomen is soft.  Skin:    Coloration: Skin is pale.  Neurological:     Comments: Oriented to self  Psychiatric:        Mood and Affect: Mood normal.        Behavior: Behavior normal.        Thought Content: Thought content normal.        Judgment: Judgment normal.             Total Time 25 minutes   Heidie Krall L. Bonita Quin, DNP, FNP-BC Palliative Medicine Team

## 2023-04-03 NOTE — Progress Notes (Addendum)
Progress Note   Patient: Kimberly Cook:[id number] DOB: [birth year] DOA: 03/26/2023     7 DOS: the patient was seen and examined on 04/03/2023   Brief hospital course: [name] is a 77 year old female with history of dementia, recent COVID-19 infection, hyperlipidemia, hypertension, neuropathy, insomnia, depression, anxiety, who presents to the emergency department for chief concerns of altered mental status, failure to thrive.  Patient is from [facility].  Vitals in the ED showed temperature of 98.8, respiration rate of 17, heart rate of 100, blood pressure 160/111, SpO2 of 100% on room air.  Serum sodium is 130, potassium 3.3, chloride 99, bicarb 14, BUN of 24, serum creatinine 0.70, eGFR greater than 60, nonfasting blood glucose 127, WBC 7.1, hemoglobin 15.5, platelets of 231.  High sensitivity troponin was 13.  TSH was 2.044.  ED treatment: LR 2 L bolus x 2.  8/22: Vital stable.  Likely secondary to recent COVID-19 infection with poor p.o. intake. Labs with improving hyponatremia with sodium at 132, potassium 3, CO2 19.  Procalcitonin 0.11.  Replating more potassium and magnesium as magnesium was borderline yesterday.  Not participating with active care, refusing mostly p.o. intake. Swallow evaluation was also ordered.  Per son and DIL, patient was recently taken away her financial privilege due to advanced dementia, since then she appears mostly depressed, poor p.o. intake and failure to thrive picture at her facility.  Palliative care was also consulted.  8/23: Mildly elevated blood pressure at 142/105.  Refusing all p.o. intake and pocketing food and medication and then spitting it out.  Per son this is being going on for couple of weeks. Pending palliative care consult.  8/24: Patient continued to refuse p.o. intake, pocketing food, family would like to see her response over the weekend before decided about full comfort care.  8/25: Patient continued to  refuse p.o. intake but little more interactive with family today, family is not requesting trial of PEG tube for feeding.  IR was consulted, patient has not taken aspirin since in the hospital as refusing all p.o. intake, it was discontinued from med orders, as requested by IR.  8/26: Patient with significant improvement today, started drinking ginger ale and Ensure, she does not want a feeding tube.  More alert and interactive.  Willing to try food.  Will be high risk for refeeding.  8/27: Patient was very lethargic and somnolent this morning, unable to take any p.o.  Apparently received Xanax for agitation overnight, should avoid any sedating medications.  8/28: Patient more alert but again refusing p.o. intake.  Replacing magnesium and potassium. Ongoing goals of care discussion regarding going back to facility with hospice which seems appropriate at this point.  8/29: Patient with worsening lethargy and continued to refuse p.o. intake.  Another lengthy discussion with family and they decided not to proceed with comfort care.  They would like to explore option for inpatient hospice.  Hospice referral was given by TOC-adding evaluation.  Assessment and Plan: * Altered mental status Waxing and waning. I suspect this is multifactorial in setting of recent COVID-19 infection along with poor appetite leading to clinical dehydration.  History of advanced dementia and worsening depression by family.  Patient was not eating well over the past couple of weeks at facility.  Likely decline with advanced dementia and failure to thrive. CT head without any acute abnormality. Continued to decline with increased lethargy.  Refusing all p.o. intake. Family now agreed to proceed with comfort care and would  like to explore option for inpatient hospice. -Referral was provided by TOC-awaiting evaluation  Generalized weakness Failure to thrive. Patient with advanced dementia, much improved mentation  today Concern of failure to thrive.  Recent COVID infection is contributory. Patient continued to refuse all p.o. intake -Palliative care is on board-family now decided to proceed with comfort care.   Dehydration With hemoconcentration, elevated BUN and metabolic acidosis Mucosa dry on presentation Status post LR 2 L bolus per EDP Sodium chloride infusion at 100 mL/h, 1 day ordered Recheck BMP in a.m.  Essential hypertension Not controlled on admission Home losartan 12.5 mg daily resumed  Hypokalemia Resolved with repletion. No more testing as patient is now comfort care  Hypothyroidism Home levothyroxine 100 mcg daily before breakfast resumed  Dementia without behavioral disturbance (HCC) Diagnosed approximately 1 month ago Oriented to name only   Anxiety and depression Home Celexa 20 mg daily, trazodone 100 mg nightly for resumed Alprazolam 0.5 mg p.o. every 6 hours as needed for anxiety resumed  Hyperlipidemia -Continue home statin  GERD (gastroesophageal reflux disease) -Continue PPI  Coronary artery disease involving native coronary artery of native heart without angina pectoris -Continue home aspirin and statin   Subjective: Patient was more lethargic and barely open her eyes.  Refusing all p.o. intake  Physical Exam: Vitals:   04/02/23 1614 04/02/23 2135 04/03/23 0439 04/03/23 0841  BP: 94/81 (!) 137/90  (!) 158/102  Pulse: 80 86  87  Resp: 16 16  20   Temp: 97.8 F (36.6 C) 98.7 F (37.1 C)  98.4 F (36.9 C)  TempSrc: Oral   Oral  SpO2: 98% 100%  100%  Weight:   77.4 kg   Height:       General.  Frail and lethargic elderly lady, in no acute distress. Pulmonary.  Lungs clear bilaterally, normal respiratory effort. CV.  Regular rate and rhythm, no JVD, rub or murmur. Abdomen.  Soft, nontender, nondistended, BS positive. CNS.  Lethargic, no apparent focal deficit Extremities.  No edema, no cyanosis, pulses intact and symmetrical. Psychiatry.   Judgment and insight appears impaired  Data Reviewed: Prior data reviewed  Family Communication: Discussed with son on phone  Disposition: Status is: Inpatient Remains inpatient appropriate because: Severity of illness  Planned Discharge Destination: Skilled nursing facility with hospice versus inpatient hospice  DVT prophylaxis.  Lovenox Time spent: 40 minutes  This record has been created using Conservation officer, historic buildings. Errors have been sought and corrected,but may not always be located. Such creation errors do not reflect on the standard of care.   Author: Arnetha Courser, MD 04/03/2023 3:30 PM  For on call review [email or web address].

## 2023-04-03 NOTE — [place] FL2 (Signed)
[place] MEDICAID FL2 LEVEL OF CARE FORM     IDENTIFICATION  Patient Name: Kimberly Cook Birthdate: 1945-11-03 Sex: female Admission Date (Current Location): 03/26/2023  [facility] and [place] Number:  Chiropodist and Address:  [facility], [address], [place], [place] [address]      Provider Number: [id number]  Attending Physician Name and Address:  Arnetha Courser, MD  Relative Name and Phone Number:       Current Level of Care: Hospital Recommended Level of Care: Skilled Nursing Facility (with hospice through [facility]) Prior Approval Number:    Date Approved/Denied:   PASRR Number: [id number]  Discharge Plan: SNF (with hospice through [facility])    Current Diagnoses: Patient Active Problem List   Diagnosis Date Noted   Altered mental status 03/26/2023   Dehydration 03/26/2023   Dementia without behavioral disturbance (HCC) 03/26/2023   Closed T7 spinal fracture (HCC) 10/24/2022   T7 vertebral fracture (HCC) 10/23/2022   Cystitis 05/15/2022   Acute cystitis 05/14/2022   Emesis 05/14/2022   Elevated troponin 05/14/2022   Wound infection 04/17/2022   Acquired scoliosis    Hypotension 04/13/2022   Post-concussion headache 04/13/2022   HFrEF (heart failure with reduced ejection fraction) (HCC) 04/11/2022   Peripheral neuropathy 04/11/2022   Hypokalemia 04/11/2022   Falls, initial encounter    Acute renal failure superimposed on stage 3a chronic kidney disease (HCC) 03/04/2022   UTI (urinary tract infection) 03/03/2022   Toxic metabolic encephalopathy 03/02/2022   Obesity (BMI 30-39.9) 03/02/2022   Exercise intolerance 03/02/2022   Left leg numbness 03/02/2022   Pericardial effusion 01/04/2022   History of stroke 08/29/2021   Neuropathy 08/29/2021   NICM (nonischemic cardiomyopathy) (HCC)    Generalized weakness    Bilateral pleural effusion    Chest pain    Pancytopenia (HCC)    Acute on chronic HFrEF (heart  failure with reduced ejection fraction) (HCC)    Malnutrition of moderate degree 04/03/2021   Metabolic encephalopathy 03/31/2021   Failed spinal cord stimulator, subsequent encounter 08/08/2020   S/P insertion of spinal cord stimulator 04/25/2020   Lumbar radiculopathy 12/07/2019   Lumbar facet arthropathy 12/07/2019   Spinal stenosis, lumbar region, with neurogenic claudication 12/07/2019   Osteoporosis 11/24/2019   Lumbar herniated disc 06/09/2019   Lumbar degenerative disc disease 06/09/2019   Spinal stenosis of cervical region 05/07/2019   Lumbar spondylosis 05/07/2019   Recurrent UTI 05/07/2019   Abnormal MRI, lumbar spine 02/18/2019   Abnormal gait 02/18/2019   Back pain 02/18/2019   Osteoarthritis 10/09/2018   Overactive bladder 10/09/2018   Chronic midline low back pain 04/22/2018   Chronic pain of left knee 04/22/2018   Toe fracture, left 03/10/2018   Lightheadedness 01/21/2018   PAC (premature atrial contraction) 01/19/2018   PVC's (premature ventricular contractions) 01/19/2018   Hernia, paraesophageal 12/16/2017   Kidney cysts 12/16/2017   CKD Stage IIIa 12/16/2017   Carotid artery stenosis 12/16/2017   Chronic pain 12/16/2017   Osteopenia 12/16/2017   DDD (degenerative disc disease), cervical 12/16/2017   Labile hypertension 09/05/2017   Anxiety and depression 08/27/2017   Insomnia 08/27/2017   Syncope 08/27/2017   Bilateral occipital neuralgia 08/27/2017   Lung nodule 06/30/2017   Coronary artery disease involving native coronary artery of native heart without angina pectoris 06/03/2017   SOB (shortness of breath) 04/11/2017   Labile blood pressure 03/08/2017   Syncope, near 03/08/2017   Palpitations 03/08/2017   Orthostatic lightheadedness 03/08/2017   Essential hypertension  S/P TKR (total knee replacement) 04/17/2015   Morbid obesity (HCC) 09/21/2014   Medication management 09/21/2014   Hypothyroidism 02/17/2014   Hyperlipidemia    GERD  (gastroesophageal reflux disease)    Vitamin D deficiency    Depression     Orientation RESPIRATION BLADDER Height & Weight      (Disoriented x 4)  Normal Incontinent Weight: 170 lb 10.2 oz (77.4 kg) Height:  5\' 4"  (162.6 cm)  BEHAVIORAL SYMPTOMS/MOOD NEUROLOGICAL BOWEL NUTRITION STATUS   (Anxiety)  (Dementia) Incontinent Diet (DYS 1. Extra Gravy on meats, potatoes. Puddings, yogurts.)  AMBULATORY STATUS COMMUNICATION OF NEEDS Skin     Verbally Other (Comment) (Erythema/redness.)                       Personal Care Assistance Level of Assistance              Functional Limitations Info  Sight, Hearing, Speech Sight Info: Adequate Hearing Info: Adequate Speech Info: Adequate (Delayed responses)    SPECIAL CARE FACTORS FREQUENCY                       Contractures Contractures Info: Not present    Additional Factors Info  Code Status, Allergies Code Status Info: DNR Allergies Info: Vibegron           Current Medications (04/03/2023):  This is the current hospital active medication list Current Facility-Administered Medications  Medication Dose Route Frequency Provider Last Rate Last Admin   0.9 %  sodium chloride infusion   Intravenous PRN Arnetha Courser, MD   Stopped at 03/27/23 2030   atorvastatin (LIPITOR) tablet 20 mg  20 mg Oral Daily Arnetha Courser, MD   20 mg at 04/02/23 0953   calcium-vitamin D (OSCAL WITH D) 500-5 MG-MCG per tablet 1 tablet  1 tablet Oral BID WC Arnetha Courser, MD   1 tablet at 04/02/23 0953   citalopram (CELEXA) tablet 20 mg  20 mg Oral Daily Cox, Amy N, DO   20 mg at 04/02/23 0953   enoxaparin (LOVENOX) injection 40 mg  40 mg Subcutaneous Q24H Cox, Amy N, DO   40 mg at 04/01/23 2047   feeding supplement (ENSURE ENLIVE / ENSURE PLUS) liquid 237 mL  237 mL Oral TID BM Arnetha Courser, MD   237 mL at 04/01/23 2046   Gerhardt's butt cream   Topical TID Arnetha Courser, MD   Given at 04/03/23 1100   labetalol (NORMODYNE) injection 20 mg   20 mg Intravenous Q3H PRN Mansy, Jan A, MD   20 mg at 04/02/23 3244   lactated ringers infusion   Intravenous Continuous Arnetha Courser, MD   Stopped at 04/03/23 0251   levothyroxine (SYNTHROID) tablet 100 mcg  100 mcg Oral QAC breakfast Cox, Amy N, DO   100 mcg at 04/02/23 0540   losartan (COZAAR) tablet 12.5 mg  12.5 mg Oral Daily Cox, Amy N, DO   12.5 mg at 04/02/23 0102   melatonin tablet 5 mg  5 mg Oral QHS Cox, Amy N, DO   5 mg at 04/01/23 2046   mirtazapine (REMERON SOL-TAB) disintegrating tablet 15 mg  15 mg Oral QHS Arnetha Courser, MD   15 mg at 04/01/23 2046   multivitamin with minerals tablet 1 tablet  1 tablet Oral Daily Arnetha Courser, MD   1 tablet at 04/02/23 0953   oxybutynin (DITROPAN) tablet 5 mg  5 mg Oral Q8H PRN Cox, Amy N, DO  pantoprazole (PROTONIX) EC tablet 40 mg  40 mg Oral Daily Arnetha Courser, MD   40 mg at 04/02/23 0953   phenol (CHLORASEPTIC) mouth spray 1 spray  1 spray Mouth/Throat PRN Arnetha Courser, MD       polyethylene glycol (MIRALAX / GLYCOLAX) packet 17 g  17 g Oral Daily Arnetha Courser, MD       senna-docusate (Senokot-S) tablet 1 tablet  1 tablet Oral QHS PRN Cox, Amy N, DO       traZODone (DESYREL) tablet 50 mg  50 mg Oral QHS PRN Arnetha Courser, MD         Discharge Medications: Please see discharge summary for a list of discharge medications.  Relevant Imaging Results:  Relevant Lab Results:   Additional Information SS#: [id number]  Margarito Liner, LCSW

## 2023-04-03 NOTE — Telephone Encounter (Signed)
  Dawn is calling to inform Dr. Okey Dupre that the patient has been in the hospital for a week. Her dementia is progressing, and she is no longer able to eat by mouth. It is likely that her IV will be removed soon, and  she may not have much time left. She canceled pt's appointment with Dr. Okey Dupre on 04/17/23

## 2023-04-03 NOTE — Plan of Care (Signed)

## 2023-04-03 NOTE — Progress Notes (Signed)
Patient pulled out IV early this morning. Patient does not have IV access. Notified Dr. Nelson Chimes. Per MD okay to leave IV out.

## 2023-04-03 NOTE — Progress Notes (Signed)
Patient is very sleepy this morning. Pocketing all food in her mouth. Unable to eat breakfast due to safety concerns. Will not take morning medication. Will attempt to give pills later when she is more awake. MD notified.

## 2023-04-03 NOTE — TOC Progression Note (Addendum)
Transition of Care [facility]) - Progression Note    Patient Details  Name: Kimberly Cook MRN: [id number] Date of Birth: 03-18-1946  Transition of Care [facility]) CM/SW Contact  Margarito Liner, LCSW Phone Number: 04/03/2023, 1:10 PM  Clinical Narrative:   Made hospice home referral to [facility] with [facility] per sons Deniece Portela and [place]) request. Sons stated she originally tested positive for COVID at [facility] on 8/19.  4:01 pm: Patient does not qualify for the hospice home so she will return to [facility] with hospice. Son [name] still wants to use [facility]. SNF can take her back tomorrow. MD is aware.  Expected Discharge Plan: Skilled Nursing Facility Barriers to Discharge: Continued Medical Work up  Expected Discharge Plan and Services       Living arrangements for the past 2 months: Skilled Nursing Facility                                       Social Determinants of Health (SDOH) Interventions SDOH Screenings   Food Insecurity: No Food Insecurity (03/27/2023)  Housing: Low Risk  (03/27/2023)  Transportation Needs: No Transportation Needs (03/27/2023)  Utilities: Not At Risk (03/27/2023)  Depression (PHQ2-9): Low Risk  (03/28/2022)  Financial Resource Strain: Low Risk  (07/24/2020)  Physical Activity: Unknown (07/24/2020)  Social Connections: Unknown (07/24/2020)  Stress: No Stress Concern Present (07/24/2020)  Tobacco Use: Medium Risk (03/26/2023)    Readmission Risk Interventions     No data to display

## 2023-04-04 DIAGNOSIS — R410 Disorientation, unspecified: Secondary | ICD-10-CM | POA: Diagnosis not present

## 2023-04-04 MED ORDER — MIRTAZAPINE 15 MG PO TBDP: 15.0000 mg | ORAL_TABLET | Freq: Every day | ORAL | 0 refills | Status: AC

## 2023-04-04 NOTE — TOC Progression Note (Signed)
Transition of Care [facility]) - Progression Note    Patient Details  Name: Kimberly Cook MRN: [id number] Date of Birth: [birth year]  Transition of Care [facility]) CM/SW Contact  Margarito Liner, LCSW Phone Number: 04/04/2023, 11:59 AM  Clinical Narrative:  CSW sent secure chat to MD to let her know that MD needs discharge summary before 3:00.   Expected Discharge Plan: Skilled Nursing Facility Barriers to Discharge: Continued Medical Work up  Expected Discharge Plan and Services       Living arrangements for the past 2 months: Skilled Nursing Facility                                       Social Determinants of Health (SDOH) Interventions SDOH Screenings   Food Insecurity: No Food Insecurity (03/27/2023)  Housing: Low Risk  (03/27/2023)  Transportation Needs: No Transportation Needs (03/27/2023)  Utilities: Not At Risk (03/27/2023)  Depression (PHQ2-9): Low Risk  (03/28/2022)  Financial Resource Strain: Low Risk  (07/24/2020)  Physical Activity: Unknown (07/24/2020)  Social Connections: Unknown (07/24/2020)  Stress: No Stress Concern Present (07/24/2020)  Tobacco Use: Medium Risk (03/26/2023)    Readmission Risk Interventions     No data to display

## 2023-04-04 NOTE — Discharge Summary (Addendum)
Discharge Summary  Kimberly Cook VWU:[id number] DOB: August 21, 1945  PCP: Almetta Lovely, [facility]  Admit date: 03/26/2023 Discharge date: 04/04/2023  Time spent: 35 minutes   Recommendations for Outpatient Follow-up:  Continue with hospice care.  Discharge Diagnoses:  Active Hospital Problems   Diagnosis Date Noted   Altered mental status 03/26/2023    Priority: 1.   Generalized weakness     Priority: 2.   Dehydration 03/26/2023    Priority: 3.   Essential hypertension     Priority: 3.   Hypokalemia 04/11/2022    Priority: 4.   Hypothyroidism 02/17/2014    Priority: 5.   Dementia without behavioral disturbance (HCC) 03/26/2023    Priority: 6.   Anxiety and depression 08/27/2017    Priority: 7.   Hyperlipidemia     Priority: 8.   GERD (gastroesophageal reflux disease)     Priority: 9.   Coronary artery disease involving native coronary artery of native heart without angina pectoris 06/03/2017    Priority: 10.   S/P TKR (total knee replacement) 04/17/2015    Resolved Hospital Problems  No resolved problems to display.    Discharge Condition: Guarded.  Diet recommendation: Pleasure feedings.  Vitals:   04/03/23 1948 04/04/23 0939  BP: (!) 140/90 (!) 154/83  Pulse: 88 88  Resp: 16 18  Temp: 98.4 F (36.9 C) 98 F (36.7 C)  SpO2: 100% 97%    History of present illness:  Kimberly Cook is a 77 year old female with history of dementia, recent COVID-19 viral infection, hyperlipidemia, hypertension, neuropathy, insomnia, depression, anxiety, who presents to the emergency department for chief concerns of altered mental status and failure to thrive.  Her condition did not significantly improved, refusing all oral intake, pocketing food and medication and then spitting it out.  She was seen by palliative care medicine.  Family made decision for transfer to SNF with hospice care.   04/04/2023: The patient was seen and examined at bedside.  She is somnolent and  arousable to voices.  Not in acute distress.   Assessment and Plan: * Altered mental status?Acute metabolic encephalopathy likely 2/2 to covid-19 viral infection. POA   Generalized weakness/Failure to thrive. Covid-19 viral infection, POA Dehydration Essential hypertension Hypokalemia Hypothyroidism Dementia without behavioral disturbance (HCC) Anxiety and depression Hyperlipidemia GERD (gastroesophageal reflux disease) Coronary artery disease involving native coronary artery of native heart without angina pectoris    Plan is to discharge to SNF.  The patient will continue with hospice care.  Hospital Course:  Principal Problem:   Altered mental status Active Problems:   Generalized weakness   Essential hypertension   Dehydration   Hypokalemia   Hypothyroidism   Dementia without behavioral disturbance (HCC)   Anxiety and depression   Hyperlipidemia   GERD (gastroesophageal reflux disease)   Coronary artery disease involving native coronary artery of native heart without angina pectoris   S/P TKR (total knee replacement)    Discharge Exam: BP (!) 154/83 (BP Location: Left Arm)   Pulse 88   Temp 98 F (36.7 C)   Resp 18   Ht 5\' 4"  (1.626 m)   Wt 77.3 kg   SpO2 97%   BMI 29.25 kg/m  General: 77 y.o. year-old female frail-appearing somnolent.  In no acute distress. Cardiovascular: Regular rate and rhythm with no rubs or gallops.  No thyromegaly or JVD noted.   Respiratory: Clear to auscultation with no wheezes or rales.  Abdomen: Soft non-distended with normal bowel sounds.  Discharge Instructions You were  cared for by a hospitalist during your hospital stay. If you have any questions about your discharge medications or the care you received while you were in the hospital after you are discharged, you can call the unit and asked to speak with the hospitalist on call if the hospitalist that took care of you is not available. Once you are discharged, your primary care  physician will handle any further medical issues. Please note that NO REFILLS for any discharge medications will be authorized once you are discharged, as it is imperative that you return to your primary care physician (or establish a relationship with a primary care physician if you do not have one) for your aftercare needs so that they can reassess your need for medications and monitor your lab values.   Allergies as of 04/04/2023       Reactions   Vibegron Other (See Comments)        Medication List     STOP taking these medications    acetaminophen 500 MG tablet Commonly known as: TYLENOL   ALPRAZolam 0.5 MG tablet Commonly known as: XANAX   cetirizine 5 MG tablet Commonly known as: ZYRTEC   Cranberry 450 MG Tabs   famotidine 20 MG tablet Commonly known as: PEPCID   HYDROcodone-acetaminophen 5-325 MG tablet Commonly known as: NORCO/VICODIN   ketorolac 0.5 % ophthalmic solution Commonly known as: ACULAR   midodrine 10 MG tablet Commonly known as: PROAMATINE   moxifloxacin 0.5 % ophthalmic solution Commonly known as: VIGAMOX   oxybutynin 5 MG tablet Commonly known as: DITROPAN   potassium chloride SA 20 MEQ tablet Commonly known as: KLOR-CON M   prednisoLONE acetate 1 % ophthalmic suspension Commonly known as: PRED FORTE   pregabalin 75 MG capsule Commonly known as: LYRICA   senna-docusate 8.6-50 MG tablet Commonly known as: Senokot-S   traZODone 100 MG tablet Commonly known as: DESYREL   venlafaxine 75 MG tablet Commonly known as: EFFEXOR       TAKE these medications    alendronate 70 MG tablet Commonly known as: FOSAMAX Take 70 mg by mouth once a week.   aspirin EC 81 MG tablet Take 1 tablet (81 mg total) by mouth at bedtime. Swallow whole.   atorvastatin 20 MG tablet Commonly known as: LIPITOR Take 1 tablet (20 mg total) by mouth daily.   calcium-vitamin D 500-5 MG-MCG tablet Commonly known as: OSCAL WITH D Take 1 tablet by mouth 2  (two) times daily.   carboxymethylcellulose 1 % ophthalmic solution Place 1 drop into both eyes at bedtime.   citalopram 20 MG tablet Commonly known as: CELEXA Take 20 mg by mouth daily.   diclofenac Sodium 1 % Gel Commonly known as: VOLTAREN Apply 2 g topically 4 (four) times daily.   hydrocortisone cream 1 % Apply 1 Application topically 2 (two) times daily as needed for itching.   levothyroxine 100 MCG tablet Commonly known as: SYNTHROID Take 1 tablet (100 mcg total) by mouth daily before breakfast.   losartan 25 MG tablet Commonly known as: COZAAR Take 0.5 tablets (12.5 mg total) by mouth daily.   melatonin 5 MG Tabs Take 1 tablet (5 mg total) by mouth at bedtime.   mirtazapine 15 MG disintegrating tablet Commonly known as: REMERON SOL-TAB Take 1 tablet (15 mg total) by mouth at bedtime.   montelukast 10 MG tablet Commonly known as: SINGULAIR Take 1 tablet (10 mg total) by mouth at bedtime.   nystatin powder Apply 1 Application topically every 6 (  six) hours as needed (groin rash).   omeprazole 40 MG capsule Commonly known as: PRILOSEC Take 1 capsule (40 mg total) by mouth daily. After lunch   ondansetron 4 MG tablet Commonly known as: Zofran Take 1 tablet (4 mg total) by mouth every 8 (eight) hours as needed for nausea or vomiting.   OXYGEN Inhale into the lungs. Uses for exertion   OYSTER SHELL/D PO Take 1 tablet by mouth 2 (two) times daily.   polyethylene glycol 17 g packet Commonly known as: MIRALAX / GLYCOLAX Take 17 g by mouth daily.   simethicone 80 MG chewable tablet Commonly known as: MYLICON Chew 80 mg by mouth every 6 (six) hours as needed for flatulence.   Vitamin D3 125 MCG (5000 UT) Tabs Take 1 tablet (5,000 Units total) by mouth daily.       Allergies  Allergen Reactions   Vibegron Other (See Comments)    Contact information for after-discharge care     Destination     [facility] .   Service: Skilled  Nursing Contact information: [address] [place] [address] [phone]                      The results of significant diagnostics from this hospitalization (including imaging, microbiology, ancillary and laboratory) are listed below for reference.    Significant Diagnostic Studies: CT HEAD WO CONTRAST ( )  Result Date: 03/26/2023 CLINICAL DATA:  Delirium. Recent diagnosis of dementia. Increasing confusion. EXAM: CT HEAD WITHOUT CONTRAST TECHNIQUE: Contiguous axial images were obtained from the base of the skull through the vertex without intravenous contrast. RADIATION DOSE REDUCTION: This exam was performed according to the departmental dose-optimization program which includes automated exposure control, adjustment of the mA and/or kV according to patient size and/or use of iterative reconstruction technique. COMPARISON:  CT scan head from 10/22/2022. FINDINGS: Brain: No evidence of acute infarction, hemorrhage, hydrocephalus, extra-axial collection or mass lesion/mass effect. There is bilateral periventricular hypodensity, which is non-specific but most likely seen in the settings of microvascular ischemic changes. Mild in extent. Redemonstration of prominent ventricular system and cerebral volume loss. Vascular: No hyperdense vessel or unexpected calcification. Intracranial arteriosclerosis. Skull: Normal. Negative for fracture or focal lesion. Sinuses/Orbits: Moderate mucoperiosteal thickening with air-fluid levels in the both chambers of sphenoid sinus, new since the prior study, favoring sphenoid sinusitis. Mild-to-moderate mucoperiosteal thickening in the bilateral ethmoidal air cells, right frontoethmoidal recess and right frontal sinus, without air-fluid levels. Other: Bilateral mastoid air cells are patent. IMPRESSION: 1. No acute intracranial abnormality. 2. Stable cerebral volume loss and mild microvascular ischemic changes. 3. Interval development of  sphenoid sinusitis. Electronically Signed   By: Jules Schick M.D.   On: 03/26/2023 16:21   CT ABDOMEN PELVIS W CONTRAST  Result Date: 03/26/2023 CLINICAL DATA:  Abdominal pain, acute, nonlocalized EXAM: CT ABDOMEN AND PELVIS WITH CONTRAST TECHNIQUE: Multidetector CT imaging of the abdomen and pelvis was performed using the standard protocol following bolus administration of intravenous contrast. RADIATION DOSE REDUCTION: This exam was performed according to the departmental dose-optimization program which includes automated exposure control, adjustment of the mA and/or kV according to patient size and/or use of iterative reconstruction technique. CONTRAST:  OMNIPAQUE IOHEXOL 300 MG/ML  SOLN COMPARISON:  CT scan abdomen and pelvis from 05/14/2022. FINDINGS: Examination is markedly limited due to extensive streak artifacts from patient's thoracolumbar spinal fixation hardware. Lower chest: The lung bases are clear. No pleural effusion. The heart is normal in  size. No pericardial effusion. Hepatobiliary: The liver is normal in size. Non-cirrhotic configuration. No suspicious mass. These is diffuse hepatic steatosis. No intrahepatic or extrahepatic bile duct dilation. Gallbladder is surgically absent. Pancreas: Atrophic/fatty replaced pancreas. No pancreatic ductal dilatation or surrounding inflammatory changes. Spleen: Within normal limits. No focal lesion. Adrenals/Urinary Tract: Markedly limited evaluation. Adrenal glands are grossly unremarkable. No suspicious renal mass. There is a 1.8 x 2.0 cm sinus cyst arising from the left kidney upper pole. No hydronephrosis. No renal or ureteric calculi. Urinary bladder is under distended, precluding optimal assessment. However, no large mass or stones identified. No perivesical fat stranding. Note is made of mucosal hyperattenuation, which is nonspecific but can be seen with acute versus chronic cystitis. Correlate clinically and with urinalysis. Stomach/Bowel:  Postsurgical changes noted at the GE junction, similar to the prior study. No disproportionate dilation of the small or large bowel loops. No evidence of abnormal bowel wall thickening or inflammatory changes. The appendix was not visualized; however there is no acute inflammatory process in the right lower quadrant. There are multiple diverticula mainly in the left hemi colon, without imaging signs of diverticulitis. Vascular/Lymphatic: No ascites or pneumoperitoneum. No abdominal or pelvic lymphadenopathy, by size criteria. No aneurysmal dilation of the major abdominal arteries. There are mild peripheral atherosclerotic vascular calcifications of the aorta and its major branches. Reproductive: The uterus is surgically absent. No large adnexal mass. Other: The visualized soft tissues and abdominal wall are unremarkable. Musculoskeletal: No suspicious osseous lesions. Posterior spinal fixation of lower thoracic/lumbar spine noted. There are mild - moderate multilevel degenerative changes in the visualized spine. IMPRESSION: 1. Markedly limited examination due to extensive streak artifacts from patient's thoracolumbar spinal fixation hardware. 2. Mucosal hyperattenuation of urinary bladder, which can be seen with acute versus chronic cystitis. Correlate clinically and with urinalysis. No focal urinary bladder mass or calculi. 3. Otherwise, no acute inflammatory process identified within the abdomen or pelvis. 4. Multiple other nonacute observations, as described above. Aortic Atherosclerosis (ICD10-I70.0). Electronically Signed   By: Jules Schick M.D.   On: 03/26/2023 16:15   DG Chest Portable 1 View  Result Date: 03/26/2023 CLINICAL DATA:  Weakness EXAM: PORTABLE CHEST 1 VIEW COMPARISON:  X-ray 05/14/2022 FINDINGS: No consolidation, pneumothorax or effusion. No edema. Normal cardiopericardial silhouette with tortuous and ectatic aorta. Overlapping cardiac leads. Extensive fixation hardware along the spine.  IMPRESSION: No acute cardiopulmonary disease. Electronically Signed   By: Karen Kays M.D.   On: 03/26/2023 15:05    Microbiology: Recent Results (from the past 240 hour(s))  SARS Coronavirus 2 by RT PCR (hospital order, performed in [facility] hospital lab) *cepheid single result test* Anterior Nasal Swab     Status: Abnormal   Collection Time: 03/26/23  3:34 PM   Specimen: Anterior Nasal Swab  Result Value Ref Range Status   SARS Coronavirus 2 by RT PCR POSITIVE (A) NEGATIVE Final    Comment: (NOTE) SARS-CoV-2 target nucleic acids are DETECTED  SARS-CoV-2 RNA is generally detectable in upper respiratory specimens  during the acute phase of infection.  Positive results are indicative  of the presence of the identified virus, but do not rule out bacterial infection or co-infection with other pathogens not detected by the test.  Clinical correlation with patient history and  other diagnostic information is necessary to determine patient infection status.  The expected result is negative.  Fact Sheet for Patients:   RoadLapTop.co.za   Fact Sheet for Healthcare Providers:   http://kim-miller.com/    This  test is not yet approved or cleared by the [place] and  has been authorized for detection and/or diagnosis of SARS-CoV-2 by FDA under an Emergency Use Authorization (EUA).  This EUA will remain in effect (meaning this test can be used) for the duration of  the COVID-19 declaration under Section 564(b)(1)  of the Act, 21 U.S.C. section 360-bbb-3(b)(1), unless the authorization is terminated or revoked sooner.   Performed at [facility], [address] [place], [place] [address]   MRSA Next Gen by PCR, Nasal     Status: None   Collection Time: 03/27/23  1:56 PM   Specimen: Nasal Mucosa; Nasal Swab  Result Value Ref Range Status   MRSA by PCR Next Gen NOT DETECTED NOT DETECTED Final    Comment: (NOTE) The  GeneXpert MRSA Assay (FDA approved for NASAL specimens only), is one component of a comprehensive MRSA colonization surveillance program. It is not intended to diagnose MRSA infection nor to guide or monitor treatment for MRSA infections. Test performance is not FDA approved in patients less than 37 years old. Performed at [facility], [address] [place], [place] [address]      Labs: Basic Metabolic Panel: Recent Labs  Lab 04/01/23 1331 04/02/23 0855 04/03/23 0613  NA 130* 131* 133*  K 2.5* 3.1* 3.5  CL 101 99 100  CO2 20* 23 24  GLUCOSE 109* 126* 115*  BUN 9 7* 7*  CREATININE 0.49 0.51 0.54  CALCIUM 8.4* 8.6* 8.1*  MG 1.8 1.4* 1.9  PHOS 2.7 2.6 2.6   Liver Function Tests: No results for input(s): "AST", "ALT", "ALKPHOS", "BILITOT", "PROT", "ALBUMIN" in the last 168 hours. No results for input(s): "LIPASE", "AMYLASE" in the last 168 hours. No results for input(s): "AMMONIA" in the last 168 hours. CBC: Recent Labs  Lab 03/28/23 1524 04/01/23 1331 04/02/23 0855 04/03/23 0613  WBC 6.0 4.0 3.8* 3.7*  HGB 14.3 14.5 14.9 12.5  HCT 38.5 41.1 41.6 35.9*  MCV 83.2 84.9 84.4 84.9  PLT 187 146* 159 150   Cardiac Enzymes: No results for input(s): "CKTOTAL", "CKMB", "CKMBINDEX", "TROPONINI" in the last 168 hours. BNP: BNP (last 3 results) Recent Labs    04/16/22 0554  BNP 159.1*    ProBNP (last 3 results) No results for input(s): "PROBNP" in the last 8760 hours.  CBG: No results for input(s): "GLUCAP" in the last 168 hours.     Signed:  Darlin Drop, MD [facility] 04/04/2023, 12:41 PM

## 2023-04-04 NOTE — Care Management Important Message (Signed)
Important Message  Patient Details  Name: Kimberly Cook MRN: [id number] Date of Birth: [birth year]   Medicare Important Message Given:  Other (see comment)  Disposition to discharge with hospice services.  Medicare IM withheld at this time out of respect for patient and family.   [name] 04/04/2023, 8:22 AM

## 2023-04-04 NOTE — TOC Transition Note (Signed)
Transition of Care [facility]) - CM/SW Discharge Note   Patient Details  Name: Kimberly Cook MRN: [id number] Date of Birth: Mar 08, 1946  [facility]SW Contact:  Margarito Liner, LCSW Phone Number: 04/04/2023, 1:38 PM   Clinical Narrative: Patient has orders to discharge to [facility] SNF today. [facility] liaison is aware. RN will call report to [phone] (Room 215A). EMS transport has been arranged and she is 3rd on the list. No further concerns. CSW signing off.    Final next level of care: Skilled Nursing Facility (with hospice) Barriers to Discharge: Barriers Resolved   Patient Goals and CMS Choice      Discharge Placement     Existing PASRR number confirmed : 04/03/23          Patient chooses bed at: [facility] Patient to be transferred to facility by: EMS Name of family member notified: Shelly Rubenstein Patient and family notified of of transfer: 04/04/23  Discharge Plan and Services Additional resources added to the After Visit Summary for                                       Social Determinants of Health (SDOH) Interventions SDOH Screenings   Food Insecurity: No Food Insecurity (03/27/2023)  Housing: Low Risk  (03/27/2023)  Transportation Needs: No Transportation Needs (03/27/2023)  Utilities: Not At Risk (03/27/2023)  Depression (PHQ2-9): Low Risk  (03/28/2022)  Financial Resource Strain: Low Risk  (07/24/2020)  Physical Activity: Unknown (07/24/2020)  Social Connections: Unknown (07/24/2020)  Stress: No Stress Concern Present (07/24/2020)  Tobacco Use: Medium Risk (03/26/2023)     Readmission Risk Interventions     No data to display

## 2023-04-04 NOTE — Plan of Care (Signed)
?  Problem: Clinical Measurements: Goal: Will remain free from infection Outcome: Progressing   Problem: Clinical Measurements: Goal: Diagnostic test results will improve Outcome: Progressing   Problem: Nutrition: Goal: Adequate nutrition will be maintained Outcome: Progressing   Problem: Coping: Goal: Level of anxiety will decrease Outcome: Progressing   

## 2023-04-04 NOTE — Plan of Care (Signed)
  Problem: Health Behavior/Discharge Planning: Goal: Ability to manage health-related needs will improve Outcome: Progressing   Problem: Safety: Goal: Ability to remain free from injury will improve Outcome: Progressing   

## 2023-04-04 NOTE — Progress Notes (Signed)
EMS called for non-emergent transport. Patient is third on the list. Gave report to [place] with [facility].   Madie Reno, RN

## 2023-04-06 DIAGNOSIS — I10 Essential (primary) hypertension: Secondary | ICD-10-CM | POA: Diagnosis not present

## 2023-04-06 DIAGNOSIS — H04123 Dry eye syndrome of bilateral lacrimal glands: Secondary | ICD-10-CM | POA: Diagnosis not present

## 2023-04-06 DIAGNOSIS — F32A Depression, unspecified: Secondary | ICD-10-CM | POA: Diagnosis not present

## 2023-04-06 DIAGNOSIS — F419 Anxiety disorder, unspecified: Secondary | ICD-10-CM | POA: Diagnosis not present

## 2023-04-06 DIAGNOSIS — R52 Pain, unspecified: Secondary | ICD-10-CM | POA: Diagnosis not present

## 2023-04-06 DIAGNOSIS — M549 Dorsalgia, unspecified: Secondary | ICD-10-CM | POA: Diagnosis not present

## 2023-04-06 DIAGNOSIS — R1312 Dysphagia, oropharyngeal phase: Secondary | ICD-10-CM | POA: Diagnosis not present

## 2023-04-06 DIAGNOSIS — R627 Adult failure to thrive: Secondary | ICD-10-CM | POA: Diagnosis not present

## 2023-04-06 DIAGNOSIS — R4189 Other symptoms and signs involving cognitive functions and awareness: Secondary | ICD-10-CM | POA: Diagnosis not present

## 2023-04-06 DIAGNOSIS — I2510 Atherosclerotic heart disease of native coronary artery without angina pectoris: Secondary | ICD-10-CM | POA: Diagnosis not present

## 2023-04-06 DIAGNOSIS — M79662 Pain in left lower leg: Secondary | ICD-10-CM | POA: Diagnosis not present

## 2023-04-06 DIAGNOSIS — R278 Other lack of coordination: Secondary | ICD-10-CM | POA: Diagnosis not present

## 2023-04-06 DIAGNOSIS — G8929 Other chronic pain: Secondary | ICD-10-CM | POA: Diagnosis not present

## 2023-04-06 DIAGNOSIS — R410 Disorientation, unspecified: Secondary | ICD-10-CM | POA: Diagnosis not present

## 2023-04-06 DIAGNOSIS — I5023 Acute on chronic systolic (congestive) heart failure: Secondary | ICD-10-CM | POA: Diagnosis not present

## 2023-04-06 DIAGNOSIS — B370 Candidal stomatitis: Secondary | ICD-10-CM | POA: Diagnosis not present

## 2023-04-08 DIAGNOSIS — R410 Disorientation, unspecified: Secondary | ICD-10-CM | POA: Diagnosis not present

## 2023-04-08 DIAGNOSIS — R1312 Dysphagia, oropharyngeal phase: Secondary | ICD-10-CM | POA: Diagnosis not present

## 2023-04-08 DIAGNOSIS — B370 Candidal stomatitis: Secondary | ICD-10-CM | POA: Diagnosis not present

## 2023-04-08 DIAGNOSIS — R52 Pain, unspecified: Secondary | ICD-10-CM | POA: Diagnosis not present

## 2023-04-08 DIAGNOSIS — R278 Other lack of coordination: Secondary | ICD-10-CM | POA: Diagnosis not present

## 2023-04-08 DIAGNOSIS — F419 Anxiety disorder, unspecified: Secondary | ICD-10-CM | POA: Diagnosis not present

## 2023-04-08 DIAGNOSIS — F32A Depression, unspecified: Secondary | ICD-10-CM | POA: Diagnosis not present

## 2023-04-08 DIAGNOSIS — M79662 Pain in left lower leg: Secondary | ICD-10-CM | POA: Diagnosis not present

## 2023-04-09 DIAGNOSIS — R627 Adult failure to thrive: Secondary | ICD-10-CM | POA: Diagnosis not present

## 2023-04-09 DIAGNOSIS — I10 Essential (primary) hypertension: Secondary | ICD-10-CM | POA: Diagnosis not present

## 2023-04-09 DIAGNOSIS — F419 Anxiety disorder, unspecified: Secondary | ICD-10-CM | POA: Diagnosis not present

## 2023-04-09 DIAGNOSIS — B370 Candidal stomatitis: Secondary | ICD-10-CM | POA: Diagnosis not present

## 2023-04-09 DIAGNOSIS — G8929 Other chronic pain: Secondary | ICD-10-CM | POA: Diagnosis not present

## 2023-04-09 DIAGNOSIS — R4189 Other symptoms and signs involving cognitive functions and awareness: Secondary | ICD-10-CM | POA: Diagnosis not present

## 2023-04-10 DIAGNOSIS — R278 Other lack of coordination: Secondary | ICD-10-CM | POA: Diagnosis not present

## 2023-04-10 DIAGNOSIS — R1312 Dysphagia, oropharyngeal phase: Secondary | ICD-10-CM | POA: Diagnosis not present

## 2023-04-17 ENCOUNTER — Ambulatory Visit: Payer: Medicare HMO | Admitting: Internal Medicine

## 2023-05-06 DEATH — deceased

## 2023-07-23 ENCOUNTER — Ambulatory Visit: Payer: Medicare HMO | Admitting: Urology
